# Patient Record
Sex: Female | Born: 1940 | Race: White | Hispanic: No | Marital: Married | Smoking: Never smoker
Health system: Southern US, Community
[De-identification: ages and names within clinical notes are randomized; demographics above are authoritative.]

## PROBLEM LIST (undated history)

## (undated) DIAGNOSIS — T8859XA Other complications of anesthesia, initial encounter: Secondary | ICD-10-CM

## (undated) DIAGNOSIS — D649 Anemia, unspecified: Secondary | ICD-10-CM

## (undated) DIAGNOSIS — Z8701 Personal history of pneumonia (recurrent): Secondary | ICD-10-CM

## (undated) DIAGNOSIS — E119 Type 2 diabetes mellitus without complications: Secondary | ICD-10-CM

## (undated) DIAGNOSIS — T4145XA Adverse effect of unspecified anesthetic, initial encounter: Secondary | ICD-10-CM

## (undated) DIAGNOSIS — H9190 Unspecified hearing loss, unspecified ear: Secondary | ICD-10-CM

## (undated) DIAGNOSIS — I6529 Occlusion and stenosis of unspecified carotid artery: Secondary | ICD-10-CM

## (undated) DIAGNOSIS — R531 Weakness: Secondary | ICD-10-CM

## (undated) DIAGNOSIS — I2510 Atherosclerotic heart disease of native coronary artery without angina pectoris: Secondary | ICD-10-CM

## (undated) DIAGNOSIS — K5790 Diverticulosis of intestine, part unspecified, without perforation or abscess without bleeding: Secondary | ICD-10-CM

## (undated) DIAGNOSIS — I499 Cardiac arrhythmia, unspecified: Secondary | ICD-10-CM

## (undated) DIAGNOSIS — A0472 Enterocolitis due to Clostridium difficile, not specified as recurrent: Secondary | ICD-10-CM

## (undated) DIAGNOSIS — C439 Malignant melanoma of skin, unspecified: Secondary | ICD-10-CM

## (undated) DIAGNOSIS — M1990 Unspecified osteoarthritis, unspecified site: Secondary | ICD-10-CM

## (undated) DIAGNOSIS — K219 Gastro-esophageal reflux disease without esophagitis: Secondary | ICD-10-CM

## (undated) DIAGNOSIS — Z8719 Personal history of other diseases of the digestive system: Secondary | ICD-10-CM

## (undated) DIAGNOSIS — I10 Essential (primary) hypertension: Secondary | ICD-10-CM

## (undated) DIAGNOSIS — G709 Myoneural disorder, unspecified: Secondary | ICD-10-CM

## (undated) DIAGNOSIS — F329 Major depressive disorder, single episode, unspecified: Secondary | ICD-10-CM

## (undated) DIAGNOSIS — I219 Acute myocardial infarction, unspecified: Secondary | ICD-10-CM

## (undated) DIAGNOSIS — R0602 Shortness of breath: Secondary | ICD-10-CM

## (undated) DIAGNOSIS — N189 Chronic kidney disease, unspecified: Secondary | ICD-10-CM

## (undated) DIAGNOSIS — Z87442 Personal history of urinary calculi: Secondary | ICD-10-CM

## (undated) DIAGNOSIS — IMO0001 Reserved for inherently not codable concepts without codable children: Secondary | ICD-10-CM

## (undated) DIAGNOSIS — J189 Pneumonia, unspecified organism: Secondary | ICD-10-CM

## (undated) DIAGNOSIS — I639 Cerebral infarction, unspecified: Secondary | ICD-10-CM

## (undated) DIAGNOSIS — I4891 Unspecified atrial fibrillation: Secondary | ICD-10-CM

## (undated) DIAGNOSIS — Z5189 Encounter for other specified aftercare: Secondary | ICD-10-CM

## (undated) DIAGNOSIS — F419 Anxiety disorder, unspecified: Secondary | ICD-10-CM

## (undated) DIAGNOSIS — E876 Hypokalemia: Secondary | ICD-10-CM

## (undated) DIAGNOSIS — C4400 Unspecified malignant neoplasm of skin of lip: Secondary | ICD-10-CM

## (undated) DIAGNOSIS — F32A Depression, unspecified: Secondary | ICD-10-CM

## (undated) HISTORY — DX: Unspecified atrial fibrillation: I48.91

## (undated) HISTORY — PX: EYE SURGERY: SHX253

## (undated) HISTORY — PX: KNEE SURGERY: SHX244

## (undated) HISTORY — PX: TONSILLECTOMY: SUR1361

## (undated) HISTORY — DX: Occlusion and stenosis of unspecified carotid artery: I65.29

## (undated) HISTORY — DX: Chronic kidney disease, unspecified: N18.9

## (undated) HISTORY — DX: Atherosclerotic heart disease of native coronary artery without angina pectoris: I25.10

## (undated) HISTORY — PX: NECK SURGERY: SHX720

## (undated) HISTORY — DX: Enterocolitis due to Clostridium difficile, not specified as recurrent: A04.72

## (undated) HISTORY — DX: Anxiety disorder, unspecified: F41.9

## (undated) HISTORY — DX: Type 2 diabetes mellitus without complications: E11.9

## (undated) HISTORY — PX: BACK SURGERY: SHX140

## (undated) HISTORY — PX: OTHER SURGICAL HISTORY: SHX169

## (undated) HISTORY — PX: CARDIAC CATHETERIZATION: SHX172

## (undated) HISTORY — DX: Unspecified malignant neoplasm of skin of lip: C44.00

## (undated) HISTORY — DX: Malignant melanoma of skin, unspecified: C43.9

## (undated) HISTORY — DX: Diverticulosis of intestine, part unspecified, without perforation or abscess without bleeding: K57.90

---

## 1998-09-29 ENCOUNTER — Other Ambulatory Visit: Admission: RE | Admit: 1998-09-29 | Discharge: 1998-09-29 | Payer: Self-pay | Admitting: Family Medicine

## 1999-07-05 ENCOUNTER — Encounter: Admission: RE | Admit: 1999-07-05 | Discharge: 1999-07-05 | Payer: Self-pay | Admitting: Internal Medicine

## 1999-07-05 ENCOUNTER — Encounter: Payer: Self-pay | Admitting: Internal Medicine

## 1999-07-23 ENCOUNTER — Other Ambulatory Visit: Admission: RE | Admit: 1999-07-23 | Discharge: 1999-07-23 | Payer: Self-pay | Admitting: Internal Medicine

## 1999-10-23 ENCOUNTER — Encounter (INDEPENDENT_AMBULATORY_CARE_PROVIDER_SITE_OTHER): Payer: Self-pay | Admitting: *Deleted

## 1999-10-23 ENCOUNTER — Other Ambulatory Visit: Admission: RE | Admit: 1999-10-23 | Discharge: 1999-10-23 | Payer: Self-pay | Admitting: Gastroenterology

## 2000-06-18 ENCOUNTER — Ambulatory Visit (HOSPITAL_COMMUNITY): Admission: RE | Admit: 2000-06-18 | Discharge: 2000-06-18 | Payer: Self-pay | Admitting: Internal Medicine

## 2000-08-05 ENCOUNTER — Encounter: Payer: Self-pay | Admitting: Internal Medicine

## 2000-08-05 ENCOUNTER — Encounter: Admission: RE | Admit: 2000-08-05 | Discharge: 2000-08-05 | Payer: Self-pay | Admitting: Internal Medicine

## 2001-10-05 ENCOUNTER — Encounter: Admission: RE | Admit: 2001-10-05 | Discharge: 2001-10-05 | Payer: Self-pay | Admitting: Internal Medicine

## 2001-10-05 ENCOUNTER — Encounter: Payer: Self-pay | Admitting: Internal Medicine

## 2002-10-18 ENCOUNTER — Encounter: Payer: Self-pay | Admitting: Internal Medicine

## 2002-10-18 ENCOUNTER — Encounter: Admission: RE | Admit: 2002-10-18 | Discharge: 2002-10-18 | Payer: Self-pay | Admitting: Internal Medicine

## 2003-01-04 ENCOUNTER — Ambulatory Visit (HOSPITAL_COMMUNITY): Admission: RE | Admit: 2003-01-04 | Discharge: 2003-01-04 | Payer: Self-pay | Admitting: Internal Medicine

## 2004-01-30 ENCOUNTER — Encounter: Admission: RE | Admit: 2004-01-30 | Discharge: 2004-01-30 | Payer: Self-pay | Admitting: Internal Medicine

## 2005-03-07 ENCOUNTER — Encounter: Admission: RE | Admit: 2005-03-07 | Discharge: 2005-03-07 | Payer: Self-pay | Admitting: Family Medicine

## 2005-05-20 ENCOUNTER — Other Ambulatory Visit: Admission: RE | Admit: 2005-05-20 | Discharge: 2005-05-20 | Payer: Self-pay | Admitting: Internal Medicine

## 2006-04-02 ENCOUNTER — Encounter: Admission: RE | Admit: 2006-04-02 | Discharge: 2006-04-02 | Payer: Self-pay | Admitting: Internal Medicine

## 2006-05-26 ENCOUNTER — Other Ambulatory Visit: Admission: RE | Admit: 2006-05-26 | Discharge: 2006-05-26 | Payer: Self-pay | Admitting: Internal Medicine

## 2007-05-25 ENCOUNTER — Encounter: Admission: RE | Admit: 2007-05-25 | Discharge: 2007-05-25 | Payer: Self-pay | Admitting: Internal Medicine

## 2007-07-16 HISTORY — PX: CAROTID ENDARTERECTOMY: SUR193

## 2007-09-29 ENCOUNTER — Ambulatory Visit: Payer: Self-pay | Admitting: Vascular Surgery

## 2008-04-05 ENCOUNTER — Ambulatory Visit: Payer: Self-pay | Admitting: Vascular Surgery

## 2008-04-12 ENCOUNTER — Ambulatory Visit: Payer: Self-pay | Admitting: Vascular Surgery

## 2008-04-27 ENCOUNTER — Ambulatory Visit: Payer: Self-pay | Admitting: Vascular Surgery

## 2008-04-27 ENCOUNTER — Encounter: Payer: Self-pay | Admitting: Vascular Surgery

## 2008-04-27 ENCOUNTER — Inpatient Hospital Stay (HOSPITAL_COMMUNITY): Admission: RE | Admit: 2008-04-27 | Discharge: 2008-04-28 | Payer: Self-pay | Admitting: Vascular Surgery

## 2008-05-17 ENCOUNTER — Ambulatory Visit: Payer: Self-pay | Admitting: Vascular Surgery

## 2008-05-30 ENCOUNTER — Encounter: Admission: RE | Admit: 2008-05-30 | Discharge: 2008-05-30 | Payer: Self-pay | Admitting: Internal Medicine

## 2008-11-29 ENCOUNTER — Ambulatory Visit: Payer: Self-pay | Admitting: Vascular Surgery

## 2009-06-05 ENCOUNTER — Ambulatory Visit: Payer: Self-pay | Admitting: Vascular Surgery

## 2009-11-06 ENCOUNTER — Other Ambulatory Visit: Admission: RE | Admit: 2009-11-06 | Discharge: 2009-11-06 | Payer: Self-pay | Admitting: Internal Medicine

## 2009-12-12 ENCOUNTER — Ambulatory Visit: Payer: Self-pay | Admitting: Vascular Surgery

## 2010-06-12 ENCOUNTER — Ambulatory Visit: Payer: Self-pay | Admitting: Vascular Surgery

## 2010-10-16 ENCOUNTER — Ambulatory Visit: Payer: Medicare Other | Attending: Orthopaedic Surgery | Admitting: Physical Therapy

## 2010-10-16 DIAGNOSIS — R5381 Other malaise: Secondary | ICD-10-CM | POA: Insufficient documentation

## 2010-10-16 DIAGNOSIS — M542 Cervicalgia: Secondary | ICD-10-CM | POA: Insufficient documentation

## 2010-10-16 DIAGNOSIS — IMO0001 Reserved for inherently not codable concepts without codable children: Secondary | ICD-10-CM | POA: Insufficient documentation

## 2010-10-16 DIAGNOSIS — R293 Abnormal posture: Secondary | ICD-10-CM | POA: Insufficient documentation

## 2010-10-18 ENCOUNTER — Ambulatory Visit: Payer: Medicare Other | Admitting: Physical Therapy

## 2010-10-24 ENCOUNTER — Ambulatory Visit: Payer: Medicare Other | Admitting: Physical Therapy

## 2010-10-25 ENCOUNTER — Ambulatory Visit: Payer: Medicare Other | Admitting: *Deleted

## 2010-10-29 ENCOUNTER — Ambulatory Visit: Payer: Medicare Other | Admitting: Physical Therapy

## 2010-10-31 ENCOUNTER — Ambulatory Visit: Payer: Medicare Other | Admitting: Physical Therapy

## 2010-11-06 ENCOUNTER — Ambulatory Visit: Payer: Medicare Other | Admitting: Physical Therapy

## 2010-11-07 ENCOUNTER — Ambulatory Visit: Payer: Medicare Other | Admitting: Physical Therapy

## 2010-11-12 ENCOUNTER — Ambulatory Visit: Payer: Medicare Other | Admitting: Physical Therapy

## 2010-11-14 ENCOUNTER — Ambulatory Visit: Payer: Medicare Other | Attending: Orthopaedic Surgery | Admitting: Physical Therapy

## 2010-11-14 DIAGNOSIS — M542 Cervicalgia: Secondary | ICD-10-CM | POA: Insufficient documentation

## 2010-11-14 DIAGNOSIS — R5381 Other malaise: Secondary | ICD-10-CM | POA: Insufficient documentation

## 2010-11-14 DIAGNOSIS — R293 Abnormal posture: Secondary | ICD-10-CM | POA: Insufficient documentation

## 2010-11-14 DIAGNOSIS — IMO0001 Reserved for inherently not codable concepts without codable children: Secondary | ICD-10-CM | POA: Insufficient documentation

## 2010-11-26 ENCOUNTER — Ambulatory Visit: Payer: Medicare Other | Admitting: Physical Therapy

## 2010-11-27 NOTE — Procedures (Signed)
CAROTID DUPLEX EXAM   INDICATION:  Follow up evaluation known carotid artery disease.   HISTORY:  Diabetes:  Orally controlled.  Cardiac:  No.  Hypertension:  Yes.  Smoking:  No.  Previous Surgery:  No.  CV History:  Previous Duplex on August 21, 2005, revealed a 60-79%  right ICA stenosis and a 60-79% left ICA stenosis.  Patient had a stroke  over 20 years ago.  Amaurosis Fugax No, Paresthesias No, Hemiparesis No                                       RIGHT             LEFT  Brachial systolic pressure:         136               138  Brachial Doppler waveforms:         Triphasic         Triphasic  Vertebral direction of flow:        Antegrade         Antegrade  DUPLEX VELOCITIES (cm/sec)  CCA peak systolic                   80                82  ECA peak systolic                   200               [id number]  ICA peak systolic                   284               [id number]  ICA end diastolic                   68                63  PLAQUE MORPHOLOGY:                  Mixed irregular   Calcified  PLAQUE AMOUNT:                      Moderate          Moderate  PLAQUE LOCATION:                    Proximal ICA, ECA Proximal ICA, ECA   IMPRESSION:  1. Right ICA stenosis 60-79%.  2. Left ICA stenosis 60-79% (high end of range).  3. Bilateral ECA stenosis.     ___________________________________________  Judeth Cornfield. Scot Dock, M.D.   MC/MEDQ  D:  09/29/2007  T:  09/29/2007  Job:  [id number]

## 2010-11-27 NOTE — Procedures (Signed)
CAROTID DUPLEX EXAM   INDICATION:  Follow up carotid artery disease.   HISTORY:  Diabetes:  Yes.  Cardiac:  No.  Hypertension:  Yes.  Smoking:  No.  Previous Surgery:  Left carotid endarterectomy 04/27/2008.  CV History:  Asymptomatic.  Amaurosis Fugax No, Paresthesias No, Hemiparesis No.                                       RIGHT             LEFT  Brachial systolic pressure:         138               142  Brachial Doppler waveforms:         Normal            Normal  Vertebral direction of flow:        Antegrade         Antegrade  DUPLEX VELOCITIES (cm/sec)  CCA peak systolic                   76                99991111  ECA peak systolic                   316               [id number]  ICA peak systolic                   305               [id number]  ICA end diastolic                   101               40  PLAQUE MORPHOLOGY:                  Heterogenous      Heterogenous  PLAQUE AMOUNT:                      Moderate/severe   Mild  PLAQUE LOCATION:                    ICA, ECA          CCA, ECA   IMPRESSION:  1. Right internal carotid artery velocities suggest a 60% to 79%      stenosis.  2. Right internal carotid artery velocities have increased from      previous study; however, still remain in the 60% to 79% category      range of stenosis.  3. Right external carotid artery stenosis.  4. Left internal carotid artery shows no evidence of restenosis,      status post carotid endarterectomy.   ___________________________________________  Judeth Cornfield. Scot Dock, M.D.   EM/MEDQ  D:  06/12/2010  T:  06/12/2010  Job:  [id number]

## 2010-11-27 NOTE — Assessment & Plan Note (Signed)
OFFICE VISIT   OTELIA, CHLEBOWSKI  DOB:  [birth year]                                       05/17/2008  [id number]   I saw the patient in the office today for followup after her recent left  carotid endarterectomy.  This is a 70 year old woman who I have been  following with a bilateral moderate carotid disease.  The stenosis on  the left progressed to greater than 80% and left carotid endarterectomy  was recommended in order to lower her risk of future stroke.  She  underwent left carotid endarterectomy with Dacron patch angioplasty on  April 27, 2008.  She did well postoperatively and was discharged on  postoperative day #1.  She returns for her first outpatient visit.  Overall she has been doing quite well and has had no focal neurologic  symptoms.   PHYSICAL EXAMINATION:  On examination her incision is healing nicely.  Blood pressure is 168/70, heart rate is 78.  Lungs are clear bilaterally  to auscultation.  Neurologic exam is nonfocal.   She does have a moderate 60-79% right carotid stenosis which we will  continue to follow.  I will see her back in 6 months for a followup  duplex scan.  She knows to call sooner if she has problems.  In the  meantime she knows to continue taking her aspirin.   Judeth Cornfield. Scot Dock, M.D.  Electronically Signed   CSD/MEDQ  D:  05/17/2008  T:  05/18/2008  Job:  [id number]   cc:   Dwaine Deter, M.D.

## 2010-11-27 NOTE — H&P (Signed)
HISTORY AND PHYSICAL EXAMINATION   April 12, 2008   Re:  TAKILA, DIVITO               DOB:  1941-04-24   DATE OF PLANNED ADMISSION:  04/27/2008.   REASON FOR ADMISSION:  Asymptomatic greater than 80% left carotid  stenosis.   HISTORY:  This is a pleasant 70 year old woman whom I have been  following with bilateral moderate carotid disease.  On her most recent  followup visit on April 12, 2008, it was noted that the left carotid  stenosis had progressed to greater than 80%.  Of note, she is right-  handed.  She has a remote history of a facial stroke many years ago  but has had no recent neurologic symptoms.  Specifically, she denies any  history of stroke, TIAs, expressive or receptive aphasia, or amaurosis  fugax.   PAST MEDICAL HISTORY:  Significant for adult onset diabetes.  She does  not require insulin.  In addition she has hypertension.  She denies any  history of hypercholesterolemia, history of previous myocardial  infarction, history of congestive heart failure or history of COPD.   FAMILY HISTORY:  Her mother had an MI but she is not sure at what age  this occurred.  She is unaware of any other history of premature  cardiovascular disease.   SOCIAL HISTORY:  She is married.  She has 2 children.  She does not  smoke tobacco.   ALLERGIES:  Mycin.   MEDICATIONS:  1. Aspirin 325 mg p.o. daily.  2. B complex 1 p.o. daily.  3. Janumet 50/1000 one p.o. daily.  4. Metformin 1000 mg 2 daily.  5. Allopurinol 100 mg pO2 daily.  6. Lasix 20 mg p.r.n.  7. Metoprolol 50 mg p.o. daily.  8. Citalopram 20 mg p.o. daily.  9. Diovan hydrochlorothiazide 100/12.5 one p.o. daily.   REVIEW OF SYSTEMS:  GENERAL:  She had no recent weight loss, weight  gain, problems with her appetite.  She is 190 pounds.  CARDIAC:  She had no chest pain, chest pressure.  She has occasional  palpitations.  She has no history of arrhythmias that she is aware of.  She has  had no significant dyspnea on exertion.  PULMONARY:  She has had no productive cough, bronchitis, asthma or  wheezing.  GI: She has had no recent change in her bowel habits except for some  occasional diarrhea.  She has a hiatal hernia.  GU: She has had no dysuria or frequency.  VASCULAR:  She has had no claudication rest pain or nonhealing ulcers.  She has had no DVT or phlebitis.  NEURO:  She has had no dizziness, blackouts, headaches or seizures.  ORTHO/SKIN:  She does have a history of arthritis and joint pain.  ENT:  She has had some recent loss of her hearing.  HEMATOLOGIC:  She has had no bleeding problems or clotting disorders.   PHYSICAL EXAMINATION:  This is a pleasant 70 year old woman who appears  her stated age.  Blood pressure is 150/78, heart rate is 78.  Neck is  supple.  There is no cervical lymphadenopathy.  She has soft bilateral  carotid bruits.  Lungs:  Clear bilaterally to auscultation.  Cardiac  exam, she has a regular rate and rhythm.  Abdomen:  Soft and nontender.  I cannot palpate an aneurysm.  She has palpable femoral pulses.  Both  feet are warm and well-perfused without ischemic ulcers.  She has mild  bilateral lower extremity swelling.  Neurologic:  She has good strength  in her upper extremities and lower extremities bilaterally.   I reviewed her duplex scan in the office today.  She has a greater than  80% left carotid stenosis with a 60-79% right carotid stenosis.  Both  vertebral arteries are patent with normally-directed flow.   I have recommended left carotid endarterectomy in order to lower her  risk of future stroke.  We have discussed the indications for the  procedure and the potential complications including but not limited to  bleeding, stroke (periprocedural risk 1-2%), nerve injury, MI, or other  unpredictable medical problems.  All of her questions were answered and  she is agreeable to proceed.  She would like to wait to schedule this   until October 14.  She knows to continue her aspirin right up through  surgery.   Judeth Cornfield. Scot Dock, M.D.  Electronically Signed   CSD/MEDQ  D:  04/12/2008  T:  04/13/2008  Job:  [id number]   cc:   Dwaine Deter, M.D.

## 2010-11-27 NOTE — Procedures (Signed)
CAROTID DUPLEX EXAM   INDICATION:  Followup carotid artery disease.   HISTORY:  Diabetes:  Yes  Cardiac:  No  Hypertension:  Yes  Smoking:  No  Previous Surgery:  Left carotid endarterectomy April 27, 2008  CV History:  No  Amaurosis Fugax No, Paresthesias No, Hemiparesis No                                       RIGHT             LEFT  Brachial systolic pressure:         140               138  Brachial Doppler waveforms:         WNL               WNL  Vertebral direction of flow:        Antegrade         Antegrade  DUPLEX VELOCITIES (cm/sec)  CCA peak systolic                   98                91  ECA peak systolic                   325               99991111  ICA peak systolic                   232               99991111  ICA end diastolic                   62                33  PLAQUE MORPHOLOGY:                  Heterogeneous     Heterogeneous  PLAQUE AMOUNT:                      Moderate  PLAQUE LOCATION:                    ICA/ECA   IMPRESSION:  1. Right internal carotid artery suggests a 60% to 79% stenosis.  2. Left internal carotid artery appears patent status post carotid      endarterectomy with no evidence of restenosis.  3. Right external carotid artery stenosis.  4. Antegrade flow noted in bilateral vertebrals.       ___________________________________________  Judeth Cornfield. Scot Dock, M.D.   CB/MEDQ  D:  12/12/2009  T:  12/12/2009  Job:  [id number]

## 2010-11-27 NOTE — Procedures (Signed)
CAROTID DUPLEX EXAM   INDICATION:  Left carotid endarterectomy.   HISTORY:  Diabetes:  Yes.  Cardiac:  No.  Hypertension:  Yes.  Smoking:  No.  Previous Surgery:  Left carotid endarterectomy on 04/27/08.  CV History:  Asymptomatic.  Amaurosis Fugax No, Paresthesias No hemiparesis No.                                       RIGHT             LEFT  Brachial systolic pressure:         182               184  Brachial Doppler waveforms:         Normal            Normal  Vertebral direction of flow:        Antegrade         Antegrade  DUPLEX VELOCITIES (cm/sec)  CCA peak systolic                   87                [id number]  ECA peak systolic                   223               [id number]  ICA peak systolic                   228               73  ICA end diastolic                   52                21  PLAQUE MORPHOLOGY:                  Mixed  PLAQUE AMOUNT:                      Moderate/Severe   None  PLAQUE LOCATION:                    ICA/ECA/CCA   IMPRESSION:  1. Low-end 60-79% stenosis of the right internal carotid artery.  2. Patent left carotid endarterectomy site with no evidence of      stenosis in the left internal carotid artery.  3. Improvement of the left internal carotid artery noted when compared      to the previous examination prior to surgery on 04/05/08.  The      right internal carotid artery appears stable.   ___________________________________________  Judeth Cornfield. Scot Dock, M.D.   CH/MEDQ  D:  11/30/2008  T:  11/30/2008  Job:  [id number]

## 2010-11-27 NOTE — Assessment & Plan Note (Signed)
OFFICE VISIT   [name], [name]  DOB:  Jun 01, 1941                                       11/29/2008  [id number]   I saw the patient in the office today for continued followup of her  carotid disease.  This is a pleasant 70 year old woman who underwent  left carotid endarterectomy with Dacron patch angioplasty on 04/27/2008  for a greater than 80% left carotid stenosis.  She returns for a 6 month  followup visit.  She has a known moderate right carotid stenosis.  Since  I saw her last she denies any history of stroke, TIAs, expressive or  receptive aphasia or amaurosis fugax.   REVIEW OF SYSTEMS:  She has had no recent chest pain, chest pressure,  palpitations or arrhythmias.  She has had no productive cough,  bronchitis, asthma or wheezing.   PHYSICAL EXAMINATION:  This is a pleasant 70 year old woman who appears  her stated age.  Blood pressure is 116/72, heart rate is 76.  Neck is  supple.  She has bilateral carotid bruits.  Lungs are clear bilaterally  to auscultation.  On cardiac exam she has a regular rate and rhythm.  Neurologic exam is nonfocal.   Carotid duplex scan shows a 60-79% right carotid stenosis in the lower  end of this range.  There is no evidence of recurrent stenosis on the  left.   She understands we would not consider right carotid endarterectomy  unless the stenosis progressed to greater than 80% or if she developed  new neurologic symptoms.  I will see her back in 6 months for a followup  duplex scan.  She knows to call sooner if she has problems.  In the  meantime she knows to continue taking her aspirin.   Judeth Cornfield. Scot Dock, M.D.  Electronically Signed   CSD/MEDQ  D:  11/29/2008  T:  11/30/2008  Job:  [id number]   cc:   Dwaine Deter, M.D.

## 2010-11-27 NOTE — Procedures (Signed)
CAROTID DUPLEX EXAM   INDICATION:  Carotid disease.   HISTORY:  Diabetes:  Yes  Cardiac:  No  Hypertension:  Yes  Smoking:  No  Previous Surgery:  Left carotid endarterectomy on 04/27/2008.  CV History:  Currently asymptomatic.  Amaurosis Fugax No, Paresthesias No, Hemiparesis No                                       RIGHT             LEFT  Brachial systolic pressure:         174               176  Brachial Doppler waveforms:         normal            normal  Vertebral direction of flow:        antegrade         antegrade  DUPLEX VELOCITIES (cm/sec)  CCA peak systolic                   101               78  ECA peak systolic                   238               [id number]  ICA peak systolic                   227               [id number]  ICA end diastolic                   59                29  PLAQUE MORPHOLOGY:                  mixed  PLAQUE AMOUNT:                      moderate/severe   none  PLAQUE LOCATION:                    ICA/ECA/CCA   IMPRESSION:  1. Low-end 60- 79% stenosis of the right internal carotid artery.  2. Patent left carotid endarterectomy site with no left internal      carotid artery stenosis.  3. No significant change noted when compared to the previous exam on      11/29/2008.   ___________________________________________  Judeth Cornfield. Scot Dock, M.D.   CH/MEDQ  D:  06/05/2009  T:  06/06/2009  Job:  [id number]

## 2010-11-27 NOTE — Discharge Summary (Signed)
[name], [name]               ACCOUNT NO.:  [id number]   MEDICAL RECORD NO.:  [id number]          PATIENT TYPE:  INP   LOCATION:  25                         FACILITY:  [facility]   PHYSICIAN:  Judeth Cornfield. Scot Dock, M.D.DATE OF BIRTH:  1941/03/02   DATE OF ADMISSION:  04/27/2008  DATE OF DISCHARGE:  04/28/2008                               DISCHARGE SUMMARY   REASON FOR ADMISSION:  Greater than 80% asymptomatic left carotid  stenosis.   HISTORY:  This is a 70 year old woman who I have been following with  bilateral moderate carotid disease.  On her most recent followup visit  on April 12, 2008, it was noted that the left carotid stenosis had  progressed to greater than 80%.  She was brought in for elective left  carotid endarterectomy in order to lower her risk of future stroke.  Her  past medical history was otherwise significant for adult onset diabetes  and hypertension.  The remainder of her history and physical is as  dictated without addition or deletion.   HOSPITAL COURSE:  The patient was admitted on April 29, 2008, and  underwent an uneventful left carotid endarterectomy with Dacron patch  angioplasty.  She awoke neurologically intact and was monitored  initially in the recovery room and then transferred to the step-down  unit.  She did require dopamine to help maintain her blood pressure.  She apparently had some problems with hypertension in the past.  She  remained neurologically intact.  By postoperative day #1, she was eating  and ambulating without difficulty.  We held her Diovan to wean her off  the dopamine with plans to discharge her once she was off the dopamine.   PROCEDURES THIS ADMISSION:  Left carotid endarterectomy with Dacron  patch angioplasty on April 27, 2008.   DISCHARGE DIAGNOSIS:  Greater than 80% asymptomatic left carotid  stenosis.   SECONDARY DIAGNOSES:  Diabetes and hypertension.   FOLLOWUP:  In 2 weeks.   DISCHARGE  MEDICATIONS:  1. Aspirin 325 mg p.o. daily.  2. B complex one p.o. daily.  3. Janumet 50/1000 one p.o. daily.  4. Metformin 1000 mg b.i.d.  5. Allopurinol 100 mg p.o. daily.  6. Metoprolol 50 mg one p.o. daily.  7. Citalopram 20 mg p.o. daily.  8. Diovan HCT 160/12.5 mg p.o. daily.  9. Travatan drops 1 drop both eyes at bedtime.   CONDITION ON DISCHARGE:  Good.   DISPOSITION:  To home.      Judeth Cornfield. Scot Dock, M.D.  Electronically Signed     CSD/MEDQ  D:  04/28/2008  T:  04/28/2008  Job:  [id number]   cc:   Dwaine Deter, M.D.

## 2010-11-27 NOTE — Op Note (Signed)
[name], [name]               ACCOUNT NO.:  [id number]   MEDICAL RECORD NO.:  [id number]          PATIENT TYPE:  INP   LOCATION:  47                         FACILITY:  [facility]   PHYSICIAN:  Judeth Cornfield. Scot Dock, M.D.DATE OF BIRTH:  1941-07-01   DATE OF PROCEDURE:  DATE OF DISCHARGE:                               OPERATIVE REPORT   PREOPERATIVE DIAGNOSIS:  Greater than 80% asymptomatic left carotid  stenosis.   POSTOPERATIVE DIAGNOSIS:  Greater than 80% asymptomatic left carotid  stenosis.   PROCEDURE:  Left carotid endarterectomy with Dacron patch angioplasty.   SURGEON:  Judeth Cornfield. Scot Dock, MD   ASSISTANT:  Debra Shoe, PA.   ANESTHESIA:  General.   INDICATIONS:  This is a 70 year old woman who I have been following with  a moderate carotid disease.  The left carotid stenosis progressed to  greater than 80% and left carotid endarterectomy was recommended in  order to lower her risk of stroke.  The procedure and potential  complications were discussed with the patient preoperatively.  All of  her questions were answered and she was agreeable to proceed.   TECHNIQUE:  The patient was taken to the operating room and received a  general anesthetic.  Arterial line had been placed by anesthesia.  The  neck and upper chest on the left were prepped and draped in the usual  sterile fashion.  An incision was made along the anterior border of the  sternocleidomastoid and dissection was carried down the common carotid  artery, which was dissected free and controlled with a Rumel tourniquet.  The facial vein was divided between 2-0 silk ties.  The internal carotid  artery above the plaque was controlled with a red vessel loop.  The  external carotid artery was controlled with a blue vessel loop.  The  superior thyroid artery was controlled.  The patient was then  heparinized.  Clamps were then placed on the internal, then the  external, and then common carotid artery.   A longitudinal arteriotomy  was made in the common carotid artery.  This was extended through the  plaque into the internal carotid artery.  The 10 shunt was placed into  the internal carotid artery, backbled and then placed in the common  carotid artery and secured with a Rumel tourniquet.  Flow was  reestablished to the shunt.  Next, the plaque was extended further  distally.  I dissected the common carotid further proximally and moved  the Rumel tourniquet down and extended the arteriotomy such that I could  get this thicker plaque in the common carotid artery.  Endarterectomy  plane was established proximally and the plaque was sharply divided.  Eversion endarterectomy was performed in the external carotid artery.  Distally, there was a nice taper in the plaque and one tacking suture  was placed.  The artery was irrigated with copious amounts of heparin  and dextran and all loose debris were removed.  The Dacron patch was  then sewn using continuous 6-0 Prolene suture.  Prior to completing the  patch closure, the artery was backbled and flushed appropriately and  anastomosis completed.  Flow was reestablished first to the external  carotid artery and to the internal carotid artery.  At completion, there  was a good pulse distal to the patch.  The shunt had been removed prior  to closing the graft of the Dacron patch angioplasty.  Hemostasis was  obtained of the wound.  The wound was then closed with deep layer of 3-0  Vicryl.  The platysma  was closed with running 3-0 Vicryl.  Skin was closed with 4-0  subcuticular stitch.  A sterile dressing was applied.  The patient awoke  neurologically intact and was transferred to the recovery room in stable  condition.  All needle and sponge counts were correct.      Judeth Cornfield. Scot Dock, M.D.  Electronically Signed     CSD/MEDQ  D:  04/27/2008  T:  04/27/2008  Job:  [id number]   cc:   Dwaine Deter, M.D.

## 2010-11-27 NOTE — Procedures (Signed)
CAROTID DUPLEX EXAM   INDICATION:  Follow-up evaluation of known carotid artery disease.   HISTORY:  Diabetes:  Orally controlled.  Cardiac:  No.  Hypertension:  Yes.  Smoking:  No.  Previous Surgery:  No.  CV History:  Previous duplex performed on 09/29/07 revealed 60-79% ICA  stenosis bilaterally.  Amaurosis Fugax No, Paresthesias No, Hemiparesis No.                                       RIGHT             LEFT  Brachial systolic pressure:         140               146  Brachial Doppler waveforms:         Triphasic         Triphasic  Vertebral direction of flow:        Antegrade         Antegrade  DUPLEX VELOCITIES (cm/sec)  CCA peak systolic                   77                64  ECA peak systolic                   181               [id number]  ICA peak systolic                   250               [id number]  ICA end diastolic                   85                126  PLAQUE MORPHOLOGY:                  Calcified         Calcified  PLAQUE AMOUNT:                      Moderate          Severe  PLAQUE LOCATION:                    Proximal ICA      Proximal ICA   IMPRESSION:  1. 60-79% right internal carotid artery stenosis.  2. 80-99% left internal carotid artery stenosis.  3. Left external carotid artery stenosis.   Dr. Scot Dock was notified of the preliminary results, and the patient was  scheduled for a follow-up doctor's appointment.   ___________________________________________  Judeth Cornfield. Scot Dock, M.D.   MC/MEDQ  D:  04/05/2008  T:  04/05/2008  Job:  [id number]

## 2010-11-28 ENCOUNTER — Ambulatory Visit: Payer: Medicare Other | Admitting: Physical Therapy

## 2010-12-03 ENCOUNTER — Ambulatory Visit: Payer: Medicare Other | Admitting: Physical Therapy

## 2010-12-05 ENCOUNTER — Encounter: Payer: Medicare Other | Admitting: Physical Therapy

## 2010-12-11 ENCOUNTER — Ambulatory Visit: Payer: Medicare Other | Admitting: Physical Therapy

## 2010-12-12 ENCOUNTER — Encounter: Payer: Medicare Other | Admitting: Physical Therapy

## 2010-12-13 ENCOUNTER — Other Ambulatory Visit (INDEPENDENT_AMBULATORY_CARE_PROVIDER_SITE_OTHER): Payer: Medicare Other

## 2010-12-13 ENCOUNTER — Ambulatory Visit (INDEPENDENT_AMBULATORY_CARE_PROVIDER_SITE_OTHER): Payer: Medicare Other

## 2010-12-13 DIAGNOSIS — Z48812 Encounter for surgical aftercare following surgery on the circulatory system: Secondary | ICD-10-CM

## 2010-12-13 DIAGNOSIS — I6529 Occlusion and stenosis of unspecified carotid artery: Secondary | ICD-10-CM

## 2010-12-13 NOTE — H&P (Signed)
HISTORY AND PHYSICAL EXAMINATION  Dec 13, 2010  Re:  [name], [name]               DOB:  10-24-1940  HISTORY OF PRESENT ILLNESS:  The patient is a 70 year old Caucasian female who presents today for continued follow-up of her known carotid disease.  The patient is status post left carotid endarterectomy in 2009 and has done well since that time.  She has been seen on a routine 6- month follow up visit for known right internal carotid artery stenosis. The patient continues to deny symptoms of TIA and CVA.  She also denies symptoms of claudication, rest pain, and nonhealing wounds in the lower extremities.  PAST MEDICAL HISTORY: 1. Diabetes mellitus. 2. Hypertension. 3. Arthritis. 4. Chronic shortness of breath.  PAST SURGICAL HISTORY: 1. Left carotid endarterectomy. 2. Bilateral cataract removal. 3. Carpal tunnel repair left hand. 4. Tonsillectomy.  ALLERGIES:  No known drug allergies.  MEDICATIONS:  The patient did not bring a list of medications with her but she does take aspirin 325 mg daily.  SOCIAL HISTORY:  The patient is married with two children.  Retired. She denies tobacco and alcohol use.  FAMILY HISTORY:  Her mother died of myocardial infarction and her father died of lung cancer.  REVIEW OF SYSTEMS:  CONSTITUTIONAL:  The patient denies weight loss, weight gain or loss appetite. CARDIOVASCULAR:  She denies claudication or rest pain, nonhealing ulcers and phlebitis in the lower extremities.  She also denies amaurosis, slurred speech, dysphasia, dysarthria and hemiparesis.  She denies chest pain and arrhythmias.  She does have shortness of breath with exertion. GASTROINTESTINAL:  The patient denies blood in stool or urine.  She has reflux and hiatal hernia.  The patient denies diarrhea and constipation. NEUROLOGIC:  The patient denies dizziness, blackouts, headaches and seizures as well as asthma and wheezing. HEMATOLOGIC:  She also denies  bleeding problems in clotted source. GENITOURINARY:  The patient denies kidney disease, burning with urination and difficulty urinating.  She does have arthritis and joint pain.  PHYSICAL EXAM:  Vital Signs:  Blood pressure 171/60 right arm sitting, rate 63, O2 sat 99% on room air.  General: This is a well-nourished female in no acute distress.  HEENT:  PERRL.  EOMI.  Normal.  Lungs: Were clear to auscultation.  Cardiac:  Is regular rate and rhythm. There is a right carotid bruit noted.  Abdomen: Soft, nontender with normal bowel sounds.  Musculoskeletal:  No major deformities or cyanosis noted.  There are 2+ pulses present in the bilateral radial, femoral and dorsalis pedis pulses.  There is 1+ edema present in the bilateral lower extremities.  The bilateral lower extremities are warm and well- perfused.  Neuro:  Exam nonfocal with no weakness or paresthesias. Skin: No ulcers or rashes.  IMAGING:  The patient had carotid duplex performed on 12/13/2010 which revealed a right internal carotid artery stenosis in the 60% to 79% range.  Right external carotid artery stenosis is present.  Left internal carotid artery is patent status post carotid endarterectomy. This study is essentially unchanged from the previous studies on 06/12/2010.  ASSESSMENT: 1. Diabetes mellitus. 2. Hypertension. 3. Arthritis. 4. 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  PLAN: 1. The patient's medical issues will continue to be followed by her     primary care physician. 2. The patient's carotid disease remains stable.  She will follow up     in 6 months with a repeat carotid duplex.  She  knows to call if she     has any changes in symptoms or new problems.  Leta Baptist, PA  Charles E. Fields, MD Electronically Signed  AY/MEDQ  D:  12/13/2010  T:  12/13/2010  Job:  [id number]

## 2010-12-17 ENCOUNTER — Ambulatory Visit: Payer: Medicare Other | Attending: Orthopaedic Surgery | Admitting: Physical Therapy

## 2010-12-17 DIAGNOSIS — R5381 Other malaise: Secondary | ICD-10-CM | POA: Insufficient documentation

## 2010-12-17 DIAGNOSIS — M542 Cervicalgia: Secondary | ICD-10-CM | POA: Insufficient documentation

## 2010-12-17 DIAGNOSIS — IMO0001 Reserved for inherently not codable concepts without codable children: Secondary | ICD-10-CM | POA: Insufficient documentation

## 2010-12-17 DIAGNOSIS — R293 Abnormal posture: Secondary | ICD-10-CM | POA: Insufficient documentation

## 2010-12-19 ENCOUNTER — Ambulatory Visit: Payer: Medicare Other | Admitting: Physical Therapy

## 2010-12-19 NOTE — Procedures (Unsigned)
CAROTID DUPLEX EXAM  INDICATION:  Follow up carotid stenosis.  HISTORY: Diabetes:  Yes. Cardiac:  No. Hypertension:  Yes. Smoking:  No. Previous Surgery:  Left carotid endarterectomy, 04/27/2008. CV History:  Asymptomatic. Amaurosis Fugax No, Paresthesias No, Hemiparesis No.                                      RIGHT             LEFT Brachial systolic pressure:         160               156 Brachial Doppler waveforms:         WNL               WNL Vertebral direction of flow:        Antegrade         Antegrade DUPLEX VELOCITIES (cm/sec) CCA peak systolic                   69                [id number] ECA peak systolic                   303               89 ICA peak systolic                   276               78 ICA end diastolic                   65                25 PLAQUE MORPHOLOGY:                  Heterogenous      Heterogenous PLAQUE AMOUNT:                      Moderate-to-severe                  Minimal PLAQUE LOCATION:                    CCA, ICA, ECA     CCA  IMPRESSION: 1.  Right internal carotid artery stenosis in the 60% to 79% range. 2.  Right external carotid artery stenosis present. 3.  Left internal carotid artery appears patent with history of     endarterectomy. 4.  Essentially unchanged since previous study on 06/12/2010.  ___________________________________________ Judeth Cornfield. Scot Dock, M.D.  SH/MEDQ  D:  12/13/2010  T:  12/13/2010  Job:  [id number]

## 2011-04-16 LAB — CBC
HCT: 30 — ABNORMAL LOW
Hemoglobin: 9.9 — ABNORMAL LOW
MCHC: 33.8
RBC: 3.06 — ABNORMAL LOW
RDW: 15.3
RDW: 15.7 — ABNORMAL HIGH
WBC: 8.6

## 2011-04-16 LAB — PROTIME-INR
INR: 1
Prothrombin Time: 13.2

## 2011-04-16 LAB — GLUCOSE, CAPILLARY
Glucose-Capillary: 181 — ABNORMAL HIGH
Glucose-Capillary: 198 — ABNORMAL HIGH

## 2011-04-16 LAB — URINALYSIS, ROUTINE W REFLEX MICROSCOPIC
Bilirubin Urine: NEGATIVE
Glucose, UA: NEGATIVE
Hgb urine dipstick: NEGATIVE
Specific Gravity, Urine: 1.024
pH: 5

## 2011-04-16 LAB — URINE MICROSCOPIC-ADD ON

## 2011-04-16 LAB — COMPREHENSIVE METABOLIC PANEL
ALT: 20
AST: 23
CO2: 23
Calcium: 8.5
Chloride: 108
Creatinine, Ser: 1.97 — ABNORMAL HIGH
GFR calc Af Amer: 31 — ABNORMAL LOW
Glucose, Bld: 111 — ABNORMAL HIGH
Sodium: 137

## 2011-04-16 LAB — BASIC METABOLIC PANEL
Calcium: 7.1 — ABNORMAL LOW
Sodium: 135

## 2011-04-16 LAB — TYPE AND SCREEN: Antibody Screen: NEGATIVE

## 2011-06-17 ENCOUNTER — Other Ambulatory Visit (INDEPENDENT_AMBULATORY_CARE_PROVIDER_SITE_OTHER): Payer: Medicare Other | Admitting: *Deleted

## 2011-06-17 DIAGNOSIS — Z48812 Encounter for surgical aftercare following surgery on the circulatory system: Secondary | ICD-10-CM

## 2011-06-17 DIAGNOSIS — I6529 Occlusion and stenosis of unspecified carotid artery: Secondary | ICD-10-CM

## 2011-07-01 ENCOUNTER — Other Ambulatory Visit: Payer: Self-pay

## 2011-07-01 DIAGNOSIS — Z48812 Encounter for surgical aftercare following surgery on the circulatory system: Secondary | ICD-10-CM

## 2011-07-01 DIAGNOSIS — I6529 Occlusion and stenosis of unspecified carotid artery: Secondary | ICD-10-CM

## 2011-07-03 ENCOUNTER — Encounter: Payer: Self-pay | Admitting: Surgery

## 2011-07-03 NOTE — Procedures (Unsigned)
CAROTID DUPLEX EXAM  INDICATION:  Left carotid endarterectomy.  HISTORY: Diabetes:  Yes. Cardiac:  No. Hypertension:  Yes. Smoking:  No. Previous Surgery:  Left carotid endarterectomy on 04/27/2008. CV History:  Currently asymptomatic. Amaurosis Fugax No, Paresthesias No, Hemiparesis No.                                      RIGHT             LEFT Brachial systolic pressure:         152               154 Brachial Doppler waveforms:         Normal            Normal Vertebral direction of flow:        Antegrade         Antegrade DUPLEX VELOCITIES (cm/sec) CCA peak systolic                   67                74 ECA peak systolic                   246               98 ICA peak systolic                   224               [id number] ICA end diastolic                   60                27 PLAQUE MORPHOLOGY:                  Mixed PLAQUE AMOUNT:                      Moderate            None PLAQUE LOCATION:                    ICA/ECA/CCA  IMPRESSION: 1. Doppler velocities suggest a low end 60% to 79% stenosis of the     right proximal internal carotid artery. 2. Patent left carotid endarterectomy site with no left internal     carotid artery stenosis. 3. No significant change noted when compared to the previous     examination on 12/13/2010  ___________________________________________ V. Leia Alf, MD  CH/MEDQ  D:  06/18/2011  T:  06/18/2011  Job:  [id number]

## 2011-07-19 DIAGNOSIS — I209 Angina pectoris, unspecified: Secondary | ICD-10-CM | POA: Diagnosis not present

## 2011-07-23 ENCOUNTER — Other Ambulatory Visit: Payer: Self-pay | Admitting: Interventional Cardiology

## 2011-07-24 ENCOUNTER — Encounter (HOSPITAL_COMMUNITY): Payer: Medicare Other

## 2011-07-24 ENCOUNTER — Encounter (HOSPITAL_BASED_OUTPATIENT_CLINIC_OR_DEPARTMENT_OTHER)
Admission: RE | Disposition: A | Discharge status: Hospice | Payer: Self-pay | Source: Ambulatory Visit | Attending: Interventional Cardiology

## 2011-07-24 ENCOUNTER — Inpatient Hospital Stay (HOSPITAL_BASED_OUTPATIENT_CLINIC_OR_DEPARTMENT_OTHER)
Admission: RE | Admit: 2011-07-24 | Discharge: 2011-07-24 | Disposition: A | Discharge status: Hospice | Payer: Medicare Other | Source: Ambulatory Visit | Attending: Interventional Cardiology | Admitting: Interventional Cardiology

## 2011-07-24 ENCOUNTER — Other Ambulatory Visit: Payer: Self-pay

## 2011-07-24 ENCOUNTER — Encounter (HOSPITAL_COMMUNITY): Payer: Self-pay | Admitting: General Practice

## 2011-07-24 ENCOUNTER — Encounter (HOSPITAL_COMMUNITY): Payer: Self-pay | Admitting: Pharmacy Technician

## 2011-07-24 ENCOUNTER — Inpatient Hospital Stay (HOSPITAL_COMMUNITY)
Admission: AD | Admit: 2011-07-24 | Discharge: 2011-08-03 | DRG: 234 | Disposition: A | Discharge status: Home / Self Care | Payer: Medicare Other | Source: Ambulatory Visit | Attending: Surgery | Admitting: Surgery

## 2011-07-24 DIAGNOSIS — E785 Hyperlipidemia, unspecified: Secondary | ICD-10-CM | POA: Diagnosis present

## 2011-07-24 DIAGNOSIS — G609 Hereditary and idiopathic neuropathy, unspecified: Secondary | ICD-10-CM | POA: Diagnosis present

## 2011-07-24 DIAGNOSIS — N189 Chronic kidney disease, unspecified: Secondary | ICD-10-CM | POA: Diagnosis present

## 2011-07-24 DIAGNOSIS — E669 Obesity, unspecified: Secondary | ICD-10-CM | POA: Diagnosis present

## 2011-07-24 DIAGNOSIS — R9430 Abnormal result of cardiovascular function study, unspecified: Secondary | ICD-10-CM | POA: Diagnosis not present

## 2011-07-24 DIAGNOSIS — Z09 Encounter for follow-up examination after completed treatment for conditions other than malignant neoplasm: Secondary | ICD-10-CM | POA: Diagnosis not present

## 2011-07-24 DIAGNOSIS — Z860100 Personal history of colon polyps, unspecified: Secondary | ICD-10-CM

## 2011-07-24 DIAGNOSIS — I2510 Atherosclerotic heart disease of native coronary artery without angina pectoris: Principal | ICD-10-CM | POA: Diagnosis present

## 2011-07-24 DIAGNOSIS — K219 Gastro-esophageal reflux disease without esophagitis: Secondary | ICD-10-CM | POA: Diagnosis not present

## 2011-07-24 DIAGNOSIS — D649 Anemia, unspecified: Secondary | ICD-10-CM | POA: Diagnosis present

## 2011-07-24 DIAGNOSIS — K2990 Gastroduodenitis, unspecified, without bleeding: Secondary | ICD-10-CM | POA: Diagnosis present

## 2011-07-24 DIAGNOSIS — R0602 Shortness of breath: Secondary | ICD-10-CM | POA: Diagnosis not present

## 2011-07-24 DIAGNOSIS — Z833 Family history of diabetes mellitus: Secondary | ICD-10-CM

## 2011-07-24 DIAGNOSIS — Z951 Presence of aortocoronary bypass graft: Secondary | ICD-10-CM | POA: Diagnosis not present

## 2011-07-24 DIAGNOSIS — M47812 Spondylosis without myelopathy or radiculopathy, cervical region: Secondary | ICD-10-CM | POA: Diagnosis present

## 2011-07-24 DIAGNOSIS — Z8711 Personal history of peptic ulcer disease: Secondary | ICD-10-CM | POA: Diagnosis not present

## 2011-07-24 DIAGNOSIS — Z8673 Personal history of transient ischemic attack (TIA), and cerebral infarction without residual deficits: Secondary | ICD-10-CM | POA: Diagnosis not present

## 2011-07-24 DIAGNOSIS — Z79899 Other long term (current) drug therapy: Secondary | ICD-10-CM | POA: Diagnosis not present

## 2011-07-24 DIAGNOSIS — Z01810 Encounter for preprocedural cardiovascular examination: Secondary | ICD-10-CM | POA: Diagnosis not present

## 2011-07-24 DIAGNOSIS — K648 Other hemorrhoids: Secondary | ICD-10-CM | POA: Diagnosis present

## 2011-07-24 DIAGNOSIS — Z8249 Family history of ischemic heart disease and other diseases of the circulatory system: Secondary | ICD-10-CM | POA: Diagnosis not present

## 2011-07-24 DIAGNOSIS — I200 Unstable angina: Secondary | ICD-10-CM | POA: Diagnosis not present

## 2011-07-24 DIAGNOSIS — M47817 Spondylosis without myelopathy or radiculopathy, lumbosacral region: Secondary | ICD-10-CM | POA: Diagnosis present

## 2011-07-24 DIAGNOSIS — I10 Essential (primary) hypertension: Secondary | ICD-10-CM | POA: Insufficient documentation

## 2011-07-24 DIAGNOSIS — E119 Type 2 diabetes mellitus without complications: Secondary | ICD-10-CM | POA: Diagnosis not present

## 2011-07-24 DIAGNOSIS — F411 Generalized anxiety disorder: Secondary | ICD-10-CM | POA: Diagnosis present

## 2011-07-24 DIAGNOSIS — I129 Hypertensive chronic kidney disease with stage 1 through stage 4 chronic kidney disease, or unspecified chronic kidney disease: Secondary | ICD-10-CM | POA: Diagnosis present

## 2011-07-24 DIAGNOSIS — Z7982 Long term (current) use of aspirin: Secondary | ICD-10-CM | POA: Diagnosis not present

## 2011-07-24 DIAGNOSIS — I739 Peripheral vascular disease, unspecified: Secondary | ICD-10-CM | POA: Insufficient documentation

## 2011-07-24 DIAGNOSIS — Z01811 Encounter for preprocedural respiratory examination: Secondary | ICD-10-CM | POA: Diagnosis not present

## 2011-07-24 DIAGNOSIS — G9332 Myalgic encephalomyelitis/chronic fatigue syndrome: Secondary | ICD-10-CM | POA: Diagnosis present

## 2011-07-24 DIAGNOSIS — I209 Angina pectoris, unspecified: Secondary | ICD-10-CM | POA: Diagnosis not present

## 2011-07-24 DIAGNOSIS — M109 Gout, unspecified: Secondary | ICD-10-CM | POA: Diagnosis present

## 2011-07-24 DIAGNOSIS — Z794 Long term (current) use of insulin: Secondary | ICD-10-CM

## 2011-07-24 DIAGNOSIS — R5382 Chronic fatigue, unspecified: Secondary | ICD-10-CM | POA: Diagnosis present

## 2011-07-24 DIAGNOSIS — R918 Other nonspecific abnormal finding of lung field: Secondary | ICD-10-CM | POA: Diagnosis not present

## 2011-07-24 DIAGNOSIS — J9819 Other pulmonary collapse: Secondary | ICD-10-CM | POA: Diagnosis not present

## 2011-07-24 DIAGNOSIS — Z801 Family history of malignant neoplasm of trachea, bronchus and lung: Secondary | ICD-10-CM

## 2011-07-24 DIAGNOSIS — K2900 Acute gastritis without bleeding: Secondary | ICD-10-CM | POA: Diagnosis not present

## 2011-07-24 DIAGNOSIS — D500 Iron deficiency anemia secondary to blood loss (chronic): Secondary | ICD-10-CM | POA: Diagnosis not present

## 2011-07-24 DIAGNOSIS — K2970 Gastritis, unspecified, without bleeding: Secondary | ICD-10-CM | POA: Diagnosis present

## 2011-07-24 DIAGNOSIS — I517 Cardiomegaly: Secondary | ICD-10-CM | POA: Diagnosis not present

## 2011-07-24 DIAGNOSIS — Z9889 Other specified postprocedural states: Secondary | ICD-10-CM | POA: Diagnosis not present

## 2011-07-24 HISTORY — DX: Anemia, unspecified: D64.9

## 2011-07-24 HISTORY — DX: Unspecified osteoarthritis, unspecified site: M19.90

## 2011-07-24 HISTORY — DX: Reserved for inherently not codable concepts without codable children: IMO0001

## 2011-07-24 HISTORY — DX: Gastro-esophageal reflux disease without esophagitis: K21.9

## 2011-07-24 HISTORY — DX: Myoneural disorder, unspecified: G70.9

## 2011-07-24 HISTORY — DX: Essential (primary) hypertension: I10

## 2011-07-24 HISTORY — DX: Shortness of breath: R06.02

## 2011-07-24 HISTORY — DX: Cerebral infarction, unspecified: I63.9

## 2011-07-24 HISTORY — DX: Encounter for other specified aftercare: Z51.89

## 2011-07-24 LAB — CBC
Hemoglobin: 7.9 g/dL — ABNORMAL LOW (ref 12.0–15.0)
MCH: 30.2 pg (ref 26.0–34.0)
MCH: 30.2 pg (ref 26.0–34.0)
MCHC: 33.5 g/dL (ref 30.0–36.0)
MCHC: 33.3 g/dL (ref 30.0–36.0)
MCV: 90.3 fL (ref 78.0–100.0)
Platelets: 149 10*3/uL — ABNORMAL LOW (ref 150–400)
Platelets: 140 10*3/uL — ABNORMAL LOW (ref 150–400)
RDW: 15 % (ref 11.5–15.5)
RDW: 15.1 % (ref 11.5–15.5)

## 2011-07-24 LAB — GLUCOSE, CAPILLARY

## 2011-07-24 LAB — TYPE AND SCREEN

## 2011-07-24 LAB — CREATININE, SERUM
Creatinine, Ser: 1.84 mg/dL — ABNORMAL HIGH (ref 0.50–1.10)
GFR calc non Af Amer: 26 mL/min — ABNORMAL LOW (ref >90)

## 2011-07-24 SURGERY — JV LEFT HEART CATHETERIZATION WITH CORONARY ANGIOGRAM
Anesthesia: Moderate Sedation

## 2011-07-24 MED ORDER — SODIUM BICARBONATE 8.4 % IV SOLN
INTRAVENOUS | Status: AC
  Administered 2011-07-24: 09:00:00 via INTRAVENOUS

## 2011-07-24 MED ORDER — ASPIRIN EC 81 MG PO TBEC
81.0000 mg | DELAYED_RELEASE_TABLET | Freq: Every day | ORAL | Status: DC
  Administered 2011-07-26 – 2011-07-29 (×4): 81 mg via ORAL
  Filled 2011-07-24 (×5): qty 1

## 2011-07-24 MED ORDER — PEG 3350-KCL-NA BICARB-NACL 420 G PO SOLR
4000.0000 mL | Freq: Once | ORAL | Status: AC
  Administered 2011-07-24: 4000 mL via ORAL
  Filled 2011-07-24: qty 4000

## 2011-07-24 MED ORDER — ASPIRIN 81 MG PO CHEW: 81.0000 mg | CHEWABLE_TABLET | Freq: Every day | ORAL | Status: DC

## 2011-07-24 MED ORDER — ACETAMINOPHEN 325 MG PO TABS
650.0000 mg | ORAL_TABLET | Freq: Once | ORAL | Status: AC
  Administered 2011-07-24: 650 mg via ORAL
  Filled 2011-07-24: qty 2

## 2011-07-24 MED ORDER — ROSUVASTATIN CALCIUM 10 MG PO TABS
10.0000 mg | ORAL_TABLET | Freq: Every day | ORAL | Status: DC
  Administered 2011-07-24 – 2011-07-29 (×6): 10 mg via ORAL
  Filled 2011-07-24 (×6): qty 1

## 2011-07-24 MED ORDER — SODIUM BICARBONATE 8.4 % IV SOLN: INTRAVENOUS | Status: DC

## 2011-07-24 MED ORDER — SODIUM BICARBONATE 8.4 % IV SOLN
INTRAVENOUS | Status: DC
  Filled 2011-07-24: qty 500

## 2011-07-24 MED ORDER — ASPIRIN 81 MG PO CHEW
324.0000 mg | CHEWABLE_TABLET | ORAL | Status: AC
  Administered 2011-07-24: 324 mg via ORAL

## 2011-07-24 MED ORDER — HYDROCODONE-ACETAMINOPHEN 5-325 MG PO TABS: 1.0000 | ORAL_TABLET | Freq: Four times a day (QID) | ORAL | Status: DC | PRN

## 2011-07-24 MED ORDER — MORPHINE SULFATE 2 MG/ML IJ SOLN: 1.0000 mg | INTRAMUSCULAR | Status: DC | PRN

## 2011-07-24 MED ORDER — NITROGLYCERIN 0.4 MG SL SUBL
0.4000 mg | SUBLINGUAL_TABLET | SUBLINGUAL | Status: DC | PRN
  Administered 2011-07-26 (×2): 0.4 mg via SUBLINGUAL
  Filled 2011-07-24: qty 25

## 2011-07-24 MED ORDER — ONDANSETRON HCL 4 MG/2ML IJ SOLN: 4.0000 mg | Freq: Four times a day (QID) | INTRAMUSCULAR | Status: DC | PRN

## 2011-07-24 MED ORDER — DIAZEPAM 5 MG PO TABS
5.0000 mg | ORAL_TABLET | ORAL | Status: AC
  Administered 2011-07-24: 5 mg via ORAL

## 2011-07-24 MED ORDER — SODIUM BICARBONATE 8.4 % IV SOLN
INTRAVENOUS | Status: AC
  Administered 2011-07-24: 10:00:00 via INTRAVENOUS

## 2011-07-24 MED ORDER — DEXTROSE 5 % IV SOLN: INTRAVENOUS | Status: DC

## 2011-07-24 MED ORDER — SODIUM CHLORIDE 0.9 % IV SOLN: 1.0000 mL/kg/h | INTRAVENOUS | Status: DC

## 2011-07-24 MED ORDER — ZOLPIDEM TARTRATE 5 MG PO TABS: 5.0000 mg | ORAL_TABLET | Freq: Every evening | ORAL | Status: DC | PRN

## 2011-07-24 MED ORDER — METOPROLOL TARTRATE 25 MG PO TABS
25.0000 mg | ORAL_TABLET | Freq: Two times a day (BID) | ORAL | Status: DC
  Administered 2011-07-24 – 2011-07-27 (×8): 25 mg via ORAL
  Filled 2011-07-24 (×11): qty 1

## 2011-07-24 MED ORDER — LINAGLIPTIN 5 MG PO TABS
5.0000 mg | ORAL_TABLET | Freq: Every day | ORAL | Status: DC
  Administered 2011-07-24 – 2011-07-29 (×6): 5 mg via ORAL
  Filled 2011-07-24 (×6): qty 1

## 2011-07-24 MED ORDER — ACETAMINOPHEN 325 MG PO TABS: 650.0000 mg | ORAL_TABLET | ORAL | Status: DC | PRN

## 2011-07-24 MED ORDER — DIPHENHYDRAMINE HCL 25 MG PO CAPS
25.0000 mg | ORAL_CAPSULE | Freq: Once | ORAL | Status: AC
  Administered 2011-07-24: 25 mg via ORAL
  Filled 2011-07-24: qty 1

## 2011-07-24 MED ORDER — ALLOPURINOL 100 MG PO TABS
100.0000 mg | ORAL_TABLET | Freq: Every day | ORAL | Status: DC
  Administered 2011-07-24 – 2011-07-29 (×6): 100 mg via ORAL
  Filled 2011-07-24 (×6): qty 1

## 2011-07-24 MED ORDER — ACETAMINOPHEN 325 MG PO TABS
650.0000 mg | ORAL_TABLET | ORAL | Status: DC | PRN
  Administered 2011-07-26 – 2011-07-28 (×5): 650 mg via ORAL
  Filled 2011-07-24 (×5): qty 2

## 2011-07-24 MED ORDER — ALPRAZOLAM 0.25 MG PO TABS: 0.2500 mg | ORAL_TABLET | Freq: Two times a day (BID) | ORAL | Status: DC | PRN

## 2011-07-24 MED ORDER — INSULIN ASPART 100 UNIT/ML ~~LOC~~ SOLN
0.0000 [IU] | SUBCUTANEOUS | Status: DC
  Administered 2011-07-25: 2 [IU] via SUBCUTANEOUS
  Filled 2011-07-24: qty 3

## 2011-07-24 MED ORDER — SODIUM BICARBONATE 8.4 % IV SOLN
INTRAVENOUS | Status: DC
  Filled 2011-07-24: qty 1000

## 2011-07-24 NOTE — OR Nursing (Signed)
Report called to Janett Billow, [place] on [phone]

## 2011-07-24 NOTE — H&P (Addendum)
Reason for admission   1. SEVERE FATIGUE WITH EXERTION, CAD, anemia    History of Present Illness  General:  71 y/o who has had LE edema, PVD. She has had DOE for 2-3 years. She has chest pains at times and has frequent nausea with some vomiting. Chest pain feels like a pressure. It comes on with any activity. She has had tachycardia in the past as well. She wakes up sometimes at night with chest pain. She sleeps a lot during the day. Can't breath with lying flat. BP normally ok at home.   Current Medications  Tylenol Extra Strength 500 MG Tablet 1-2 tablet as needed daily  Fish Oil 1000 MG Capsule 2 capsules daily  B Complex one tablet Tablet daily   Aspirin 325 MG Tablet 1 tablet Once a day  Colchicine 0.6 MG Tablet i po bid prn as directed  Vitamin D3 1000 UNIT Capsule 4 capsules Once a day  Nabumetone 750 MG Tablet 1 tablet in pm as needed  Lasix 20 MG Tablet 1 tablet as needed for leg swelling  Toprol XL 50 MG Tablet Extended Release 24 Hour 1 tablet Once a day  Citalopram Hydrobromide 20 MG Tablet 1 tablet prn  Diovan HCT 160-12.5 MG Tablet 1 tablet Once a day  Allopurinol 100 MG Tablet 1 tablet qd  Vicodin 5-500 MG Tablet 1 tablet as needed for pain 8 hours  Januvia 50 MG Tablet daily qd  Metformin HCl 1000 MG Tablet 1 tablet with meals Twice a day  Prilosec OTC 20 MG Tablet Delayed Release 1 tablet prn  Hydrocodone-Acetaminophen 5-325 MG Tablet 1-2 tablet as needed for pain once a day  Medication List reviewed and reconciled with the patient   Past Medical History  Type 2 diabetes mellitus  Hypertension  Gout  Hyperlipidemia  GERD  Peripheral neuropathy  History of bacterial overgrowth causing diarrhea  carotid artery disease with left carotid endarterectomy and 50-60% right carotid artery stenosis, followed by Dr. Joylene Igo  Renal insufficiency with creatinine 1.6-1.8  history of peptic ulcer disease with negative H. 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 Rodell Perna  History of palpitations that resolved on metoprolol therapy  Fatty food intolerance in 1996 with normal ultrasounds  History of colon polyps  question of CVA in the distant past with no residual symptoms   Surgical History  right carpal tunnel release   tonsillectomy   left carotid endarterectomy, 2009, Dr. Joylene Igo    Family History  Mother: deceased 15 yrs MI   Lung cancer in her father who died at age 66 mother had hypertension diabetes and heart disease; no siblings.   Social History  General:  History of smoking  cigarettes: Never smoked no Alcohol.  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 One child was born without an esophagus and had transposition of part of the colon to repair. The daughter also has ITP.[name]    Allergies  Mycins: Hallucintions  Beta Blockers: Fatigue  Thiazides: Gout  sulfa: psychiatric disturbances  Norvasc: edema   Review of Systems  FOLLOW-UP ROS:  Gastroenterology: denies nausea/vomiting. General: denies, fever, chills. GYN/GU no dysuria. Neurology: no focal deficits.  Chest discomfort, shortness of breath with exertion as noted above. All other systems negative.  No bright red blood per rectum, no melena   Vital Signs  Wt 203.2, Wt change  3.2 lb, Ht 64, BMI 34.88, Pulse sitting 76, BP sitting 150/90.   Examination  General Examination: GENERAL APPEARANCE alert, oriented, NAD, pleasant.  SKIN: normal, no rash.  HEAD: [place]/AT.  EYES: EOMI, Conjunctiva clear, no drainage.  NECK: supple, no bruits, no JVD.  LUNGS: clear to auscultation bilaterally, no wheezes, rhonchi, rales.  HEART: no murmurs, regular rate and rhythm, normal S1S2.  ABDOMEN: soft and not tender, non-distended, no masses palpated.  BACK: unremarkable.  EXTREMITIES: no edema.  NEUROLOGIC EXAM: non-focal exam.  PERIPHERAL PULSES: normal (2+)  bilaterally PT pulses.  PSYCH appropriate mood and affect .     Assessments   1. Angina Pectoris - 413.9 (Primary)   2. Essential hypertension, benign - 401.1   3. Shortness of breath - 786.05   Treatment  1. Angina Pectoris   Cardiac cath significant for severe two-vessel disease.  Heavy calcification of the proximal LAD with a 90% stenosis.  Focal 80% stenosis of the large dominant right coronary artery.  Given that she has renal insufficiency and anemia with hemoglobin today being 7.8, we will admit the patient.  She is not a good candidate for long-term Plavix.  She is not a good candidate for a prolonged PCI with Rotablator which is what would be needed to revascularize the LAD.  Therefore, will consult cardiac surgeons regarding possible bypass surgery.     2. Essential hypertension, benign   stop Diovan HCT Tablet, 160-12.5 MG, 1 tablet, Orally, Once a day Continue Toprol XL Tablet Extended Release 24 Hour, 50 MG, 1 tablet, Orally, Once a day Controlled at home.  Will hold the ARB given renal function.    3. Shortness of breath   echocardiogram done within the last few weeks in our office.     4. anemia   seems to be getting worse.  Hemoglobin now around 8.  Type and screen is ordered and done.  We'll plan for transfusion and GI consultation as an inpatient.  We'll Hemoccult stool.

## 2011-07-24 NOTE — Op Note (Signed)
PROCEDURE:  Left heart catheterization with selective coronary angiography.  INDICATIONS:  Abnormal stress test.  Angina.  The risks, benefits, and details of the procedure were explained to the patient.  The patient verbalized understanding and wanted to proceed.  Informed written consent was obtained.  PROCEDURE TECHNIQUE:  After Xylocaine anesthesia a 73F sheath was placed in the right femoral artery with a single anterior needle wall stick.   Left coronary angiography was done using a Judkins L4 guide catheter.  Right coronary angiography was done using a Judkins R4 guide catheter.  Left heart cath was done using a pigtail catheter.    CONTRAST:  Total of 30 cc.  COMPLICATIONS:  None.    HEMODYNAMICS:  Aortic pressure was 147/57 and, mean aortic pressure 92; LV pressure was 149/8; LVEDP 15.  There was no gradient between the left ventricle and aorta.    ANGIOGRAPHIC DATA:   The left main coronary artery is widely patent.  The left anterior descending artery is heavily calcified in the proximal vessel.  There is a focal 90% proximal LAD lesion.  There is moderate disease in the mid to distal LAD up to 40%.  The left circumflex artery is a large vessel.  There is a large branching OM one across the lateral wall which appears widely patent.  The remainder the circumflex is a medium-sized vessel.  This branches into a distal obtuse marginal which is small.  At the bifurcation there is a 90% stenosis affecting both terminal branches of the circumflex.  These terminal branches are quite small.  The right coronary artery is a large dominant vessel.  There is a focal 80% proximal stenosis.  The posterior descending artery and posterolateral artery appear widely patent.  LEFT VENTRICULOGRAM:  Left ventricular angiogram was not performed to minimize contrast exposure. LVEDP was 15 mmHg.  IMPRESSIONS:  1. Normal left main coronary artery. 2. Severely diseased proximal LAD with heavy  calcification.  Most severe stenosis of 90%. 3. Normal proximal to mid left circumflex artery and its branches.  The small,  terminal branches of the circumflex had a 90% stenosis. 4.  Large dominant right coronary artery with a focal proximal 80% stenosis.   5.  LVEDP 15  mmHg.  Ejection fraction not assessed to minimize contrast exposure.  RECOMMENDATION:  We'll have to discuss with the family regarding the best strategy for revascularization.  The right coronary artery would be a straightforward PCI.  The LAD however is heavily calcified and will likely need rotational atherectomy.  The terminal branches of the distal circumflex have a 90% stenosis.  These would be too small to revascularize.  Everything is complicated by the patient's anemia and renal insufficiency.  A prolonged PCI could be harmful to her kidneys.  We'll have to discuss surgery is an option as well.

## 2011-07-24 NOTE — Consult Note (Signed)
[facility] Reason for consult: Anemia Referring Physician: Dr. Sherrilyn Rist [name] is an 71 y.o. female.  HPI: Pleasant woman who sees Dr. Inda Merlin on a regular basis and was recently found to be anemic. Her hemoglobin normally runs about 10 and had dropped down to 9.3. She had chest pain and saw Dr. Irish Lack who performed a cardiac catheterization today. Her preop labs the catheterization revealed the anemia her hemoglobin appears to have dropped even further. She has been seen by Dr. Irish Lack and by Dr. Caffie Pinto. The feeling is that but other issues are resolved that it would be best if she undergoes CABG. The patient states that she does not take anything on routine basis for heartburn or indigestion. She has had bleeding ulcers in the 1970s and was treated with Tagamet. She's had previous colonoscopies by Dr. Velora Heckler the last being a number of years ago before Dr. Velora Heckler retired. She thinks she may have had polyps. She denies epigastric pain or dyspepsia but does note that she has been taking a lot of Aleve for the past several months for a number of aches and pains. Other problems include type 2 diabetes, peripheral neuropathy, osteoarthritis with multiple aches and pains, vitamin D sufficiency and chronic fatigue syndrome. Previous surgeries include prior left carotid endarterectomy tonsillectomy carpal tunnel release  No past medical history on file.  No past surgical history on file.  No family history on file.  Social History:  does not have a smoking history on file. She does not have any smokeless tobacco history on file. Her alcohol and drug histories not on file. I have reviewed the patient's current medication. Allergies: No Known Allergies  Medications:    Results for orders placed during the hospital encounter of 07/24/11 (from the past 48 hour(s))  PREPARE RBC (CROSSMATCH)     Status: Normal   Collection Time   07/24/11  2:42 PM      Component Value  Range Comment   Order Confirmation ORDER PROCESSED BY BLOOD BANK     CBC     Status: Abnormal   Collection Time   07/24/11  3:55 PM      Component Value Range Comment   WBC 5.1  4.0 - 10.5 (K/uL)    RBC 2.62 (*) 3.87 - 5.11 (MIL/uL)    Hemoglobin 7.9 (*) 12.0 - 15.0 (g/dL)    HCT 23.7 (*) 36.0 - 46.0 (%)    MCV 90.5  78.0 - 100.0 (fL)    MCH 30.2  26.0 - 34.0 (pg)    MCHC 33.3  30.0 - 36.0 (g/dL)    RDW 15.1  11.5 - 15.5 (%)    Platelets 140 (*) 150 - 400 (K/uL)     No results found.    Blood pressure 150/57, pulse 72, temperature 98.2 F (36.8 C), resp. rate 16, SpO2 98.00%.  Physical exam:  General-pleasant white female in no acute distress Eyes are nonicteric Lungs-clear Heart regular rate and rhythm without murmurs or gallops Abdomen-soft and nontender   Assessment/Plan: 1. Anemia-the patient does have a history of colon polyps needs a colonoscopy. With the amount of Aleve that she has been using we will try go ahead and look in her stomach as well. I discussed this with the patient. She is tentatively scheduled for 2:30 tomorrow. We have discussed the procedures with her.  [name],Chalee Hirota L 07/24/2011, 4:42 PM

## 2011-07-24 NOTE — OR Nursing (Signed)
Transported to 3700 via stretcher on monitor without change

## 2011-07-24 NOTE — Consult Note (Signed)
[address]            [name]          [phone]      Reason for Consult: Severe two-vessel coronary disease with unstable angina Referring Physician: Casandra Doffing, MD  [name] is an 71 y.o. female.  HPI:   She is a 71 year old woman with a history of diabetes, hypertension, and hyperlipidemia as well as cerebrovascular disease status post left carotid endarterectomy, and known chronic kidney disease with a creatinine of 1.6-1.8. She presents with about a one-year history of episodic substernal chest discomfort associated with shortness of breath and exertional fatigue. These episodes are becoming more frequent and recently have been occurring at rest. Over the past week she has taken multiple sublingual nitroglycerin tablets for relief. She underwent cardiac catheterization today which showed high-grade proximal LAD and diagonal stenosis as well as a high-grade proximal right coronary stenosis. She was also noted to have anemia with a hemoglobin around 7.8.   Past Medical History    Type 2 diabetes mellitus   Hypertension   Gout   Hyperlipidemia   GERD   Peripheral neuropathy   History of bacterial overgrowth causing diarrhea   carotid artery disease with left carotid endarterectomy and 50-60% right carotid artery stenosis, followed by Dr. Joylene Espinoza   Renal insufficiency with creatinine 1.6-1.8   history of peptic ulcer disease with negative H. 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 Rodell Espinoza   History of palpitations that resolved on metoprolol therapy   Fatty food intolerance in 1996 with normal ultrasounds   History of colon polyps   question of CVA in the distant past with no residual symptoms      Surgical History   right carpal tunnel release    tonsillectomy    left carotid endarterectomy, 2009, Dr. Joylene Espinoza       Family History   Mother: deceased  4 yrs MI    Lung cancer in her father who died at age 34 mother had hypertension diabetes and heart disease; no siblings.      Social History   General:  History of smoking  cigarettes: Never smoked no Alcohol.      Allergies: No Known Allergies  Medications:  I have reviewed the patient's current medications. Prior to Admission:  Prescriptions prior to admission  Medication Sig Dispense Refill  . acetaminophen (TYLENOL) 500 MG tablet Take 500 mg by mouth every 6 (six) hours as needed. For pain      . allopurinol (ZYLOPRIM) 100 MG tablet Take 100 mg by mouth daily.      Marland Kitchen aspirin 325 MG tablet Take 325 mg by mouth daily.      . B Complex Vitamins (VITAMIN B COMPLEX PO) Take 1 tablet by mouth daily.      . brimonidine (ALPHAGAN P) 0.1 % SOLN Place 1 drop into the left eye 2 (two) times daily.      . Cholecalciferol (VITAMIN D) 2000 UNITS tablet Take 2,000 Units by mouth 2 (two) times daily.      . citalopram (CELEXA) 20 MG tablet Take 20 mg by mouth daily.      . colchicine 0.6 MG tablet Take 0.6 mg by mouth 2 (two) times daily. gout      .  fish oil-omega-3 fatty acids 1000 MG capsule Take 2 g by mouth daily.      . furosemide (LASIX) 20 MG tablet Take 20 mg by mouth daily.      . metFORMIN (GLUCOPHAGE) 1000 MG tablet Take 1,000 mg by mouth 2 (two) times daily with a meal.      . metoprolol (TOPROL-XL) 50 MG 24 hr tablet Take 50 mg by mouth daily.      Marland Kitchen omeprazole (PRILOSEC) 20 MG capsule Take 20 mg by mouth daily as needed. Acid reflux      . sitaGLIPtin (JANUVIA) 50 MG tablet Take 50 mg by mouth daily.      . timolol (TIMOPTIC) 0.5 % ophthalmic solution Place 1 drop into the left eye daily.      . travoprost, benzalkonium, (TRAVATAN) 0.004 % ophthalmic solution Place 1 drop into the left eye at bedtime.       Scheduled:   . acetaminophen  650 mg Oral Once  . allopurinol  100 mg Oral Daily  . aspirin EC  81 mg Oral Daily  . diphenhydrAMINE  25 mg Oral Once  . insulin aspart   0-9 Units Subcutaneous Q4H  . linagliptin  5 mg Oral Daily  . metoprolol tartrate  25 mg Oral BID  . rosuvastatin  10 mg Oral Daily   Continuous:  [id number], ALPRAZolam, HYDROcodone-acetaminophen, nitroGLYCERIN, ondansetron (ZOFRAN) IV, zolpidem  Results for orders placed during the hospital encounter of 07/24/11 (from the past 48 hour(s))  PREPARE RBC (CROSSMATCH)     Status: Normal   Collection Time   07/24/11  2:42 PM      Component Value Range Comment   Order Confirmation ORDER PROCESSED BY BLOOD BANK       BMET    Component Value Date/Time   NA 135 04/28/2008 0400   K 5.2* 04/28/2008 0400   CL 110 04/28/2008 0400   CO2 19 04/28/2008 0400   GLUCOSE 192* 04/28/2008 0400   BUN 29* 04/28/2008 0400   CREATININE 1.75* 04/28/2008 0400   CALCIUM 7.1* 04/28/2008 0400   GFRNONAA 29* 04/28/2008 0400   GFRAA  Value: 35        The eGFR has been calculated using the MDRD equation. This calculation has not been validated in all clinical* 04/28/2008 0400    CBC    Component Value Date/Time   WBC 5.9 07/24/2011 1148   RBC 2.58* 07/24/2011 1148   HGB 7.8* 07/24/2011 1148   HCT 23.3* 07/24/2011 1148   PLT 149* 07/24/2011 1148   MCV 90.3 07/24/2011 1148   MCH 30.2 07/24/2011 1148   MCHC 33.5 07/24/2011 1148   RDW 15.0 07/24/2011 1148      Review of Systems  Constitutional: Positive for malaise/fatigue. Negative for fever, chills, weight loss and diaphoresis.  HENT: Negative.   Eyes: Negative.   Respiratory: Negative.   Cardiovascular: Positive for chest pain and leg swelling. Negative for palpitations, orthopnea, claudication and PND.  Gastrointestinal: Negative for nausea, vomiting, abdominal pain, diarrhea, constipation, blood in stool and melena.  Genitourinary: Negative.   Musculoskeletal: Negative.   Skin: Negative.   Neurological: Positive for tingling. Negative for dizziness, focal weakness and weakness.       Numbness in feet  Endo/Heme/Allergies: Does not bruise/bleed  easily.  Psychiatric/Behavioral: Negative.    Pulse 73, temperature 98.2 F (36.8 C), resp. rate 16, SpO2 98.00%. Physical Exam  Constitutional: She is oriented to person, place, and time. She appears well-developed and well-nourished. No distress.  HENT:  Head: Normocephalic and atraumatic.  Mouth/Throat: Oropharynx is clear and moist.  Eyes: EOM are normal. Pupils are equal, round, and reactive to light.  Neck: Normal range of motion. Neck supple. No JVD present. No thyromegaly present.  Cardiovascular: Normal rate, regular rhythm and intact distal pulses.  Exam reveals no gallop and no friction rub.   No murmur heard. Respiratory: Breath sounds normal. No respiratory distress.  GI: Soft. Bowel sounds are normal. She exhibits no mass. There is no tenderness.  Musculoskeletal: She exhibits edema.  Lymphadenopathy:    She has no cervical adenopathy.  Neurological: She is alert and oriented to person, place, and time. She has normal strength. A sensory deficit is present. No cranial nerve deficit.       Decreased sensation in both feet    Cardiac Cath:  HEMODYNAMICS:  Aortic pressure was 147/57 and, mean aortic pressure 92; LV pressure was 149/8; LVEDP 15.  There was no gradient between the left ventricle and aorta.    ANGIOGRAPHIC DATA:   The left main coronary artery is widely patent.  The left anterior descending artery is heavily calcified in the proximal vessel.  There is a focal 90% proximal LAD lesion.  There is moderate disease in the mid to distal LAD up to 40%.  The left circumflex artery is a large vessel.  There is a large branching OM one across the lateral wall which appears widely patent.  The remainder the circumflex is a medium-sized vessel.  This branches into a distal obtuse marginal which is small.  At the bifurcation there is a 90% stenosis affecting both terminal branches of the circumflex.  These terminal branches are quite small.  The right coronary artery is a  large dominant vessel.  There is a focal 80% proximal stenosis.  The posterior descending artery and posterolateral artery appear widely patent.  LEFT VENTRICULOGRAM:  Left ventricular angiogram was not performed to minimize contrast exposure. LVEDP was 15 mmHg.  IMPRESSIONS:    1. Normal left main coronary artery.  2. Severely diseased proximal LAD with heavy calcification.  Most severe stenosis of 90%.  3. Normal proximal to mid left circumflex artery and its branches.  The small,  terminal branches of the circumflex had a 90% stenosis.  4. Large dominant right coronary artery with a focal proximal 80% stenosis.     5. LVEDP 15  mmHg.  Ejection fraction not assessed to minimize contrast exposure. 6.  Assessment/Plan:  She has high-grade two-vessel coronary disease involving the LAD and right coronary systems. She now has unstable anginal symptoms and I agree that coronary bypass graft surgery is the best treatment. She also has significant anemia with no obvious blood loss and is going to be seen by GI medicine for evaluation. She does have chronic renal insufficiency with a creatinine reported to be 1.6-1.8. I would plan to do her surgery during this admission after evaluation by GI medicine. I discussed the operative procedure with the patient and family including alternatives, benefits and risks; including but not limited to bleeding, blood transfusion, infection, stroke, myocardial infarction, graft failure, heart block requiring a permanent pacemaker, organ dysfunction, renal failure requiring dialysis and death.  [name] understands and agrees to proceed.  We will schedule surgery for Friday or Monday depending on her GI workup and renal function.  Gaye Pollack 07/24/2011, 3:38 PM

## 2011-07-24 NOTE — OR Nursing (Signed)
Tegaderm dressing applied, site intact, level 0, bedrest begins at 1155

## 2011-07-24 NOTE — OR Nursing (Signed)
Dr Irish Lack at bedside to discuss results and treatment plan with pt and family

## 2011-07-24 NOTE — OR Nursing (Signed)
Meal served 

## 2011-07-25 ENCOUNTER — Other Ambulatory Visit: Payer: Self-pay

## 2011-07-25 ENCOUNTER — Inpatient Hospital Stay (HOSPITAL_COMMUNITY): Payer: Medicare Other

## 2011-07-25 ENCOUNTER — Other Ambulatory Visit (HOSPITAL_COMMUNITY): Payer: Self-pay | Admitting: Radiology

## 2011-07-25 ENCOUNTER — Encounter (HOSPITAL_COMMUNITY)
Admission: AD | Disposition: A | Discharge status: Home / Self Care | Payer: Self-pay | Source: Ambulatory Visit | Attending: Interventional Cardiology

## 2011-07-25 ENCOUNTER — Encounter (HOSPITAL_BASED_OUTPATIENT_CLINIC_OR_DEPARTMENT_OTHER): Payer: Self-pay

## 2011-07-25 ENCOUNTER — Encounter (HOSPITAL_COMMUNITY): Payer: Self-pay | Admitting: Gastroenterology

## 2011-07-25 DIAGNOSIS — Z01810 Encounter for preprocedural cardiovascular examination: Secondary | ICD-10-CM

## 2011-07-25 DIAGNOSIS — I2510 Atherosclerotic heart disease of native coronary artery without angina pectoris: Secondary | ICD-10-CM

## 2011-07-25 HISTORY — PX: COLONOSCOPY: SHX5424

## 2011-07-25 HISTORY — PX: ESOPHAGOGASTRODUODENOSCOPY: SHX5428

## 2011-07-25 LAB — BASIC METABOLIC PANEL
BUN: 23 mg/dL (ref 6–23)
CO2: 22 mEq/L (ref 19–32)
GFR calc non Af Amer: 33 mL/min — ABNORMAL LOW (ref >90)
Glucose, Bld: 149 mg/dL — ABNORMAL HIGH (ref 70–99)
Potassium: 3.9 mEq/L (ref 3.5–5.1)
Sodium: 140 mEq/L (ref 135–145)

## 2011-07-25 LAB — GLUCOSE, CAPILLARY
Glucose-Capillary: 135 mg/dL — ABNORMAL HIGH (ref 70–99)
Glucose-Capillary: 136 mg/dL — ABNORMAL HIGH (ref 70–99)
Glucose-Capillary: 152 mg/dL — ABNORMAL HIGH (ref 70–99)

## 2011-07-25 LAB — CBC
HCT: 32.3 % — ABNORMAL LOW (ref 36.0–46.0)
Hemoglobin: 10.6 g/dL — ABNORMAL LOW (ref 12.0–15.0)
MCV: 91.2 fL (ref 78.0–100.0)
WBC: 7.2 10*3/uL (ref 4.0–10.5)

## 2011-07-25 LAB — HEMOGLOBIN A1C: Mean Plasma Glucose: 137 mg/dL — ABNORMAL HIGH (ref <117)

## 2011-07-25 SURGERY — EGD (ESOPHAGOGASTRODUODENOSCOPY)
Anesthesia: Moderate Sedation

## 2011-07-25 MED ORDER — INSULIN ASPART 100 UNIT/ML ~~LOC~~ SOLN
0.0000 [IU] | Freq: Three times a day (TID) | SUBCUTANEOUS | Status: DC
  Administered 2011-07-26: 2 [IU] via SUBCUTANEOUS
  Administered 2011-07-26: 3 [IU] via SUBCUTANEOUS
  Administered 2011-07-26: 2 [IU] via SUBCUTANEOUS
  Administered 2011-07-27: 5 [IU] via SUBCUTANEOUS
  Administered 2011-07-27 – 2011-07-28 (×3): 7 [IU] via SUBCUTANEOUS
  Administered 2011-07-28 – 2011-07-29 (×3): 3 [IU] via SUBCUTANEOUS
  Filled 2011-07-25: qty 3

## 2011-07-25 MED ORDER — ALBUTEROL SULFATE (5 MG/ML) 0.5% IN NEBU
2.5000 mg | INHALATION_SOLUTION | Freq: Once | RESPIRATORY_TRACT | Status: AC
  Administered 2011-07-25: 2.5 mg via RESPIRATORY_TRACT

## 2011-07-25 MED ORDER — MIDAZOLAM HCL 10 MG/2ML IJ SOLN
INTRAMUSCULAR | Status: AC
  Filled 2011-07-25: qty 2

## 2011-07-25 MED ORDER — MIDAZOLAM HCL 5 MG/5ML IJ SOLN
INTRAMUSCULAR | Status: DC | PRN
  Administered 2011-07-25: 2 mg via INTRAVENOUS

## 2011-07-25 MED ORDER — FENTANYL NICU IV SYRINGE 50 MCG/ML
INJECTION | INTRAMUSCULAR | Status: DC | PRN
  Administered 2011-07-25 (×3): 25 ug via INTRAVENOUS

## 2011-07-25 MED ORDER — FENTANYL CITRATE 0.05 MG/ML IJ SOLN
INTRAMUSCULAR | Status: AC
  Filled 2011-07-25: qty 2

## 2011-07-25 MED ORDER — MIDAZOLAM HCL 10 MG/2ML IJ SOLN
INTRAMUSCULAR | Status: DC | PRN
  Administered 2011-07-25 (×2): 1 mg via INTRAVENOUS
  Administered 2011-07-25: 2 mg via INTRAVENOUS

## 2011-07-25 MED ORDER — SODIUM CHLORIDE 0.9 % IV SOLN
INTRAVENOUS | Status: DC
  Administered 2011-07-25: 500 mL via INTRAVENOUS

## 2011-07-25 NOTE — Op Note (Signed)
[facility] [place], [place]  [id number]  ENDOSCOPY PROCEDURE REPORT  PATIENT:  Debra, Espinoza  MR#:  [id number] BIRTHDATE:  01/13/1941, 70 yrs. old  GENDER:  female  ENDOSCOPIST:  Laurence Spates Referred by:  Dr Irish Lack  PROCEDURE DATE:  07/25/2011 PROCEDURE:  EGD, diagnostic 43235 ASA CLASS:  Class III INDICATIONS:  anemia, FOBT + stool  MEDICATIONS:   Versed 4 mg IV, Fentanyl 50 mcg IV TOPICAL ANESTHETIC:  Cetacaine Spray  DESCRIPTION OF PROCEDURE:   After the risks and benefits of the procedure were explained, informed consent was obtained.  The Pentax Gastroscope [id number] endoscope was introduced through the mouth and advanced to the second portion of the duodenum.  The instrument was slowly withdrawn as the mucosa was fully examined. <<PROCEDUREIMAGES>>  Mild gastritis was found antrum  Normal duodenum without inflammation, mass, ulcer, AVM, or other pathology. in the bulb and descending duodenum.  nl esophagus in the total esophagus. Retroflexed views revealed no abnormalities.    The scope was then withdrawn from the patient and the procedure completed.  COMPLICATIONS:  A complication of none occurred on 07/25/2011 at.  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 eSIGNEDLaurence Spates at 07/25/2011 02:59 PM  Linnell Fulling, [id number]

## 2011-07-25 NOTE — Progress Notes (Signed)
SUBJECTIVE:  Feels ok. No chest pain.  Tolerated prep for colonoscopy.  OBJECTIVE:   Vitals:   Filed Vitals:   07/24/11 2100 07/24/11 2116 07/24/11 2146 07/25/11 0500  BP: 153/54 150/55 140/71 144/78  Pulse: 70 71 67 67  Temp: 98.4 F (36.9 C) 98.4 F (36.9 C) 98.3 F (36.8 C) 98.4 F (36.9 C)  TempSrc:   Oral Oral  Resp: 20 18 18 18   SpO2:   96% 96%   I&O's:   Intake/Output Summary (Last 24 hours) at 07/25/11 1042 Last data filed at 07/24/11 2100  Gross per 24 hour  Intake    350 ml  Output      0 ml  Net    350 ml   TELEMETRY: Reviewed telemetry pt in NSR:     PHYSICAL EXAM General: Well developed, well nourished, in no acute distress Head: Eyes PERRLA, No xanthomas.   Normal cephalic and atramatic  Lungs:   Clear bilaterally to auscultation and percussion. Heart:   HRRR S1 S2 Abdomen: obese Msk:  . Normal strength and tone for age. Extremities:   No cyanosis  Neuro: Alert and oriented X 3. Psych:  Good affect, responds appropriately   LABS: Basic Metabolic Panel:  Basename 07/25/11 0655 07/24/11 1555  NA 140 --  K 3.9 --  CL 103 --  CO2 22 --  GLUCOSE 149* --  BUN 23 --  CREATININE 1.55* 1.84*  CALCIUM 8.3* --  MG -- --  PHOS -- --   Liver Function Tests: No results found for this basename: AST:2,ALT:2,ALKPHOS:2,BILITOT:2,PROT:2,ALBUMIN:2 in the last 72 hours No results found for this basename: LIPASE:2,AMYLASE:2 in the last 72 hours CBC:  Basename 07/25/11 0655 07/24/11 1555  WBC 7.2 5.1  NEUTROABS -- --  HGB 10.6* 7.9*  HCT 32.3* 23.7*  MCV 91.2 90.5  PLT 163 140*   Cardiac Enzymes: No results found for this basename: CKTOTAL:3,CKMB:3,CKMBINDEX:3,TROPONINI:3 in the last 72 hours BNP: No components found with this basename: POCBNP:3 D-Dimer: No results found for this basename: DDIMER:2 in the last 72 hours Hemoglobin A1C: No results found for this basename: HGBA1C in the last 72 hours Fasting Lipid Panel: No results found for this  basename: CHOL,HDL,LDLCALC,TRIG,CHOLHDL,LDLDIRECT in the last 72 hours Thyroid Function Tests: No results found for this basename: TSH,T4TOTAL,FREET3,T3FREE,THYROIDAB in the last 72 hours Anemia Panel: No results found for this basename: VITAMINB12,FOLATE,FERRITIN,TIBC,IRON,RETICCTPCT in the last 72 hours Coag Panel:   Lab Results  Component Value Date   INR 1.0 04/20/2008    RADIOLOGY: No results found.    ASSESSMENT: CAD, anemia  PLAN:  1) Plan for EGD, colonoscopy today.  Hbg significantly improved after one unit of blood.  2) bypass surgery planned for Friday or Monday depending on results of EGD.  3) creatinine down to 1.55 after aggressive hydration post catheterization.  Jettie Booze., MD  07/25/2011  10:42 AM

## 2011-07-25 NOTE — Progress Notes (Signed)
                   [address]            [place] [address]          [phone]      Day of Surgery Procedure(s) (LRB): ESOPHAGOGASTRODUODENOSCOPY (EGD) (N/A) COLONOSCOPY (N/A) Subjective: No chest pain or SOB.  Back from EGD and Colonoscopy  Objective: Vital signs in last 24 hours: Temp:  [98.3 F (36.8 C)-100.4 F (38 C)] 99 F (37.2 C) (01/10 1604) Pulse Rate:  [64-71] 67  (01/10 1604) Cardiac Rhythm:  [-]  Resp:  [12-47] 16  (01/10 1604) BP: (131-203)/(36-141) 189/78 mmHg (01/10 1604) SpO2:  [95 %-99 %] 96 % (01/10 1604)  Hemodynamic parameters for last 24 hours:    Intake/Output from previous day: 01/09 0701 - 01/10 0700 In: 350 [Blood:350] Out: -  Intake/Output this shift:    General appearance: alert and cooperative Heart: regular rate and rhythm, S1, S2 normal, no murmur, click, rub or gallop Lungs: clear to auscultation bilaterally  Lab Results:  Basename 07/25/11 0655 07/24/11 1555  WBC 7.2 5.1  HGB 10.6* 7.9*  HCT 32.3* 23.7*  PLT 163 140*   BMET:  Basename 07/25/11 0655 07/24/11 1555  NA 140 --  K 3.9 --  CL 103 --  CO2 22 --  GLUCOSE 149* --  BUN 23 --  CREATININE 1.55* 1.84*  CALCIUM 8.3* --    PT/INR: No results found for this basename: LABPROT,INR in the last 72 hours ABG No results found for this basename: phart, pco2, po2, hco3, tco2, acidbasedef, o2sat   CBG (last 3)   Basename 07/25/11 1153 07/25/11 0800 07/25/11 0402  GLUCAP 175* 152* 135*    Assessment/Plan: S/P Procedure(s) (LRB): ESOPHAGOGASTRODUODENOSCOPY (EGD) (N/A) COLONOSCOPY (N/A) EGD and Colonoscopy results discussed with Dr. Oletta Lamas.  There is no definate source of anemia on either study. I will not have time in schedule to do surgery until Monday due to more urgent cases in the hospital having pain on heparin and ntg.  My partners schedules are also full tomorrow.  I discussed this with patient and family and they are in agreement to proceed  Monday.  If she has any problems over the weekend she will need to be done at that time.   LOS: 1 day    Yaslyn Cumby K 07/25/2011

## 2011-07-25 NOTE — Progress Notes (Signed)
VASCULAR LAB PRELIMINARY  PRELIMINARY  PRELIMINARY  PRELIMINARY  Pre-op Cardiac Surgery  Carotid Findings:    Upper Extremity Right Left  Brachial Pressures 183 183  Radial Waveforms T T  Ulnar Waveforms T T  Palmar Arch (Allen's Test) WNL Doppler signal diminishes >50% with radial compression and remains normal with ulnar compression.   Findings:      Lower  Extremity Right Left  Dorsalis Pedis 250 [place] 295 Bipasic  Anterior Tibial    Posterior Tibial 257 287  Ankle/Brachial Indices >1.0 >1.0    Findings:  ABI's probably abnormal secondary to calcified waveforms.   [name], 07/25/2011, 11:33 AM

## 2011-07-25 NOTE — Op Note (Signed)
[facility] [address] [place], [place]  [address]  COLONOSCOPY PROCEDURE REPORT  PATIENT:  [name], [name]  MR#:  [id number] BIRTHDATE:  May 25, 1941, 61 yrs. old  GENDER:  female  ENDOSCOPIST:  Laurence Spates Referred by:  PROCEDURE DATE:  07/25/2011 PROCEDURE: COLONOSCOPY ASA CLASS: 3 INDICATIONS: Anemia  MEDICATIONS: Fentanyl 75 mcg, versed 6 mg IV TOPICAL ANESTHETIC:  DESCRIPTION OF PROCEDURE:   After the risks and benefits of the procedure were explained, informed consent was obtained.  The pediatric colonoscope was introuced  through the rectum following a digital rectal exam. We were able to easily advance to the cecum. The appendiceal orifice and ileocecal valve were identified. The scope was withdrawn. The colon was carefully examined on withdrawal. There were no AVMs, diverticula, masses or polyps seen. Scope was drawn into the rectum and retroflexed with a finding of internal hemorrhoids. Patient tolerated procedure well. Prep was quite good. <<PROCEDUREIMAGES>> .  COMPLICATIONS:  None  ENDOSCOPIC IMPRESSION: Normal colonoscopy other than internal hemorrhoids. No explanation for the patient's anemia  RECOMMENDATIONS: The patient should be able to go ahead with cardiac surgery as planned. We'll need to make sure that iron levels are adequate. Would recommend resuming Hemoccults in 3 years and repeat colonoscopy in 10 years.  ______________________________ Laurence Spates  CC:  n. eSIGNEDLaurence Spates at 07/25/2011 03:15 PM  Linnell Fulling, [id number]

## 2011-07-25 NOTE — H&P (Signed)
  Hospital notes reviewed.

## 2011-07-26 ENCOUNTER — Encounter (HOSPITAL_COMMUNITY): Payer: Self-pay | Admitting: Gastroenterology

## 2011-07-26 ENCOUNTER — Other Ambulatory Visit: Payer: Self-pay

## 2011-07-26 LAB — BASIC METABOLIC PANEL
Calcium: 8.8 mg/dL (ref 8.4–10.5)
Creatinine, Ser: 1.46 mg/dL — ABNORMAL HIGH (ref 0.50–1.10)
GFR calc Af Amer: 41 mL/min — ABNORMAL LOW (ref >90)
GFR calc non Af Amer: 35 mL/min — ABNORMAL LOW (ref >90)
Sodium: 139 mEq/L (ref 135–145)

## 2011-07-26 LAB — CBC
MCH: 30.5 pg (ref 26.0–34.0)
MCV: 92.4 fL (ref 78.0–100.0)
Platelets: 163 10*3/uL (ref 150–400)
RBC: 3.67 MIL/uL — ABNORMAL LOW (ref 3.87–5.11)
RDW: 14.7 % (ref 11.5–15.5)

## 2011-07-26 LAB — GLUCOSE, CAPILLARY
Glucose-Capillary: 191 mg/dL — ABNORMAL HIGH (ref 70–99)
Glucose-Capillary: 213 mg/dL — ABNORMAL HIGH (ref 70–99)

## 2011-07-26 MED ORDER — ALPRAZOLAM 0.25 MG PO TABS
0.2500 mg | ORAL_TABLET | ORAL | Status: DC | PRN
  Administered 2011-07-28: 0.25 mg via ORAL
  Filled 2011-07-26: qty 1

## 2011-07-26 MED ORDER — ISOSORBIDE MONONITRATE ER 30 MG PO TB24
30.0000 mg | ORAL_TABLET | Freq: Every day | ORAL | Status: DC
  Administered 2011-07-26 – 2011-07-29 (×4): 30 mg via ORAL
  Filled 2011-07-26 (×4): qty 1

## 2011-07-26 MED ORDER — AMLODIPINE BESYLATE 5 MG PO TABS
5.0000 mg | ORAL_TABLET | Freq: Every day | ORAL | Status: DC
  Administered 2011-07-26 – 2011-07-29 (×4): 5 mg via ORAL
  Filled 2011-07-26 (×5): qty 1

## 2011-07-26 NOTE — Progress Notes (Signed)
SUBJECTIVE:  Feels ok. No chest pain.  Tolerated colonoscopy.  No active bleeding found.  OBJECTIVE:   Vitals:   Filed Vitals:   07/25/11 2100 07/26/11 0500 07/26/11 1131 07/26/11 1135  BP: 150/67 153/93 185/79 185/79  Pulse: 96 76 82   Temp: 98.6 F (37 C) 98.1 F (36.7 C)    TempSrc: Oral Oral    Resp: 18 18    Height: 5\' 4"  (1.626 m)     Weight: 92.08 kg (203 lb)     SpO2: 95% 92%     I&O's:    Intake/Output Summary (Last 24 hours) at 07/26/11 1259 Last data filed at 07/26/11 0900  Gross per 24 hour  Intake    360 ml  Output      0 ml  Net    360 ml   TELEMETRY: Reviewed telemetry pt in NSR:     PHYSICAL EXAM General: Well developed, well nourished, in no acute distress Head: Eyes PERRLA, No xanthomas.   Normal cephalic and atramatic  Lungs:   Clear bilaterally to auscultation and percussion. Heart:   HRRR S1 S2 Abdomen: obese Msk:  . Normal strength and tone for age. Extremities:   No cyanosis  Neuro: Alert and oriented X 3. Psych:  Good affect, responds appropriately   LABS: Basic Metabolic Panel:  Basename 07/26/11 0945 07/25/11 0655  NA 139 140  K 3.8 3.9  CL 101 103  CO2 24 22  GLUCOSE 279* 149*  BUN 16 23  CREATININE 1.46* 1.55*  CALCIUM 8.8 8.3*  MG -- --  PHOS -- --   Liver Function Tests: No results found for this basename: AST:2,ALT:2,ALKPHOS:2,BILITOT:2,PROT:2,ALBUMIN:2 in the last 72 hours No results found for this basename: LIPASE:2,AMYLASE:2 in the last 72 hours CBC:  Basename 07/26/11 0945 07/25/11 0655  WBC 7.2 7.2  NEUTROABS -- --  HGB 11.2* 10.6*  HCT 33.9* 32.3*  MCV 92.4 91.2  PLT 163 163   Cardiac Enzymes: No results found for this basename: CKTOTAL:3,CKMB:3,CKMBINDEX:3,TROPONINI:3 in the last 72 hours BNP: No components found with this basename: POCBNP:3 D-Dimer: No results found for this basename: DDIMER:2 in the last 72 hours Hemoglobin A1C:  Basename 07/25/11 0655  HGBA1C 6.4*   Fasting Lipid Panel: No  results found for this basename: CHOL,HDL,LDLCALC,TRIG,CHOLHDL,LDLDIRECT in the last 72 hours Thyroid Function Tests: No results found for this basename: TSH,T4TOTAL,FREET3,T3FREE,THYROIDAB in the last 72 hours Anemia Panel: No results found for this basename: VITAMINB12,FOLATE,FERRITIN,TIBC,IRON,RETICCTPCT in the last 72 hours Coag Panel:   Lab Results  Component Value Date   INR 1.0 04/20/2008    RADIOLOGY: No results found.    ASSESSMENT: CAD, anemia  PLAN:  1) EGD, colonoscopy done.  Hbg significantly improved after one unit of blood.  No bleeding source found.  2) bypass surgery planned for Monday.  3) creatinine down to 1.48 after aggressive hydration post catheterization.  4) Restart Imdur and amlodipine. BP elevated today.  Avoiding ARB due to renal isufficiency.  Jettie Booze., MD  07/26/2011  12:59 PM

## 2011-07-26 NOTE — Progress Notes (Signed)
Pt complaining of 4/10 chest pressure while lying in bed. Relieved by 2 SL nitro, put on 2 liters O2 and EKG obtained. Dr. Cyndia Bent at bedside. Told to call Dr. Lawson Fiscal tonight if pt has recurring chest pain. Will continue to monitor. Lillia Mountain, RN

## 2011-07-26 NOTE — Progress Notes (Signed)
Patient ID: [name], female   DOB: 18-Apr-1941, 71 y.o.   MRN: [id number]   Filed Vitals:   07/26/11 1131 07/26/11 1135 07/26/11 1400 07/26/11 2100  BP: 185/79 185/79 155/74 190/70  Pulse: 82  77 97  Temp:   99.3 F (37.4 C) 99.6 F (37.6 C)  TempSrc:   Oral   Resp:   18 18  Height:      Weight:      SpO2:   94% 94%   Having some chest pressure this pm for the first time since admission.  It is easing off with SL NTG.    I am planning to do CABG Monday.  Dr. Prescott Gum is on call this weekend if she continues to have problems.  She is not on heparin or ntg drip at this time.

## 2011-07-27 ENCOUNTER — Inpatient Hospital Stay (HOSPITAL_COMMUNITY): Payer: Medicare Other

## 2011-07-27 LAB — GLUCOSE, CAPILLARY
Glucose-Capillary: 254 mg/dL — ABNORMAL HIGH (ref 70–99)
Glucose-Capillary: 327 mg/dL — ABNORMAL HIGH (ref 70–99)
Glucose-Capillary: 323 mg/dL — ABNORMAL HIGH (ref 70–99)
Glucose-Capillary: 250 mg/dL — ABNORMAL HIGH (ref 70–99)

## 2011-07-27 NOTE — Progress Notes (Signed)
SUBJECTIVE:  Feels ok and denies chest pain.  OBJECTIVE:   Vitals:   Filed Vitals:   07/26/11 2130 07/26/11 2140 07/27/11 0520 07/27/11 0550  BP: 185/82 164/73 179/78 186/75  Pulse:   96   Temp:   99.8 F (37.7 C)   TempSrc:      Resp:   20   Height:      Weight:      SpO2:   94%    I&O's:   Intake/Output Summary (Last 24 hours) at 07/27/11 0933 Last data filed at 07/26/11 1300  Gross per 24 hour  Intake    240 ml  Output      0 ml  Net    240 ml   TELEMETRY: Reviewed telemetry pt in NSR     PHYSICAL EXAM General: Well developed, well nourished, in no acute distress Head: Eyes PERRLA, No xanthomas.   Normal cephalic and atramatic  Lungs:   Clear bilaterally to auscultation and percussion. Heart:   HRRR S1 S2 Pulses are 2+ & equal.            No carotid bruit. No JVD.  No abdominal bruits. No femoral bruits. Abdomen: Bowel sounds are positive, abdomen soft and non-tender without masses Extremities:   No clubbing, cyanosis or edema.  DP +1 Neuro: Alert and oriented X 3. Psych:  Good affect, responds appropriately   LABS: Basic Metabolic Panel:  Basename 07/26/11 0945 07/25/11 0655  NA 139 140  K 3.8 3.9  CL 101 103  CO2 24 22  GLUCOSE 279* 149*  BUN 16 23  CREATININE 1.46* 1.55*  CALCIUM 8.8 8.3*  MG -- --  PHOS -- --   CBC:  Basename 07/26/11 0945 07/25/11 0655  WBC 7.2 7.2  NEUTROABS -- --  HGB 11.2* 10.6*  HCT 33.9* 32.3*  MCV 92.4 91.2  PLT 163 163   Hemoglobin A1C:  Basename 07/25/11 0655  HGBA1C 6.4*   Coag Panel:   Lab Results  Component Value Date   INR 1.0 04/20/2008    RADIOLOGY: Dg Chest Port 1 View  07/27/2011  *RADIOLOGY REPORT*  Clinical Data: Preop  PORTABLE CHEST - 1 VIEW  Comparison: 02/07/2009  Findings: Mild cardiomegaly.  Pulmonary vascularity is within normal limits.  No consolidation or lung mass.  No pleural effusion.  No interstitial edema.  IMPRESSION: Mild cardiomegaly without decompensation.  Original Report  Authenticated By: Jamas Lav, M.D.      ASSESSMENT:  1.  2 vessel obstructive ASCAD 2.  Anemia with no bleeding source found on Colonoscopy/EGD 3.  HTN - amlodipine and Imdur restarted yesterday 4.  Renal insufficiency 5.  DM 6.  dyslipidemia  PLAN:   1.  Plan CABG on Monday 1/14 by Dr. Cyndia Bent 2.  Adjust BP meds as needed 3.  Continue ASA/nitrates/beta blocker and statin  Sueanne Margarita, MD  07/27/2011  9:33 AM

## 2011-07-28 LAB — BASIC METABOLIC PANEL
BUN: 16 mg/dL (ref 6–23)
CO2: 24 mEq/L (ref 19–32)
Chloride: 98 mEq/L (ref 96–112)
GFR calc non Af Amer: 36 mL/min — ABNORMAL LOW (ref >90)
Glucose, Bld: 267 mg/dL — ABNORMAL HIGH (ref 70–99)
Potassium: 3.7 mEq/L (ref 3.5–5.1)
Sodium: 138 mEq/L (ref 135–145)

## 2011-07-28 LAB — GLUCOSE, CAPILLARY: Glucose-Capillary: 258 mg/dL — ABNORMAL HIGH (ref 70–99)

## 2011-07-28 LAB — URINALYSIS, ROUTINE W REFLEX MICROSCOPIC
Bilirubin Urine: NEGATIVE
Nitrite: NEGATIVE
Specific Gravity, Urine: 1.012 (ref 1.005–1.030)
Urobilinogen, UA: 0.2 mg/dL (ref 0.0–1.0)

## 2011-07-28 LAB — CBC
HCT: 33.5 % — ABNORMAL LOW (ref 36.0–46.0)
Hemoglobin: 11.2 g/dL — ABNORMAL LOW (ref 12.0–15.0)
MCHC: 33.4 g/dL (ref 30.0–36.0)
RBC: 3.66 MIL/uL — ABNORMAL LOW (ref 3.87–5.11)
WBC: 7.4 10*3/uL (ref 4.0–10.5)

## 2011-07-28 LAB — TYPE AND SCREEN
ABO/RH(D): O POS
Unit division: 0
Unit division: 0

## 2011-07-28 LAB — URINE MICROSCOPIC-ADD ON

## 2011-07-28 MED ORDER — CHLORHEXIDINE GLUCONATE 4 % EX LIQD
60.0000 mL | Freq: Once | CUTANEOUS | Status: AC
  Administered 2011-07-28: 4 via TOPICAL
  Filled 2011-07-28: qty 60

## 2011-07-28 MED ORDER — PLASMA-LYTE 148 IV SOLN
INTRAVENOUS | Status: DC
  Filled 2011-07-28 (×2): qty 0.5

## 2011-07-28 MED ORDER — NITROGLYCERIN IN D5W 200-5 MCG/ML-% IV SOLN
2.0000 ug/min | INTRAVENOUS | Status: DC
  Filled 2011-07-28: qty 250

## 2011-07-28 MED ORDER — DEXTROSE 5 % IV SOLN
750.0000 mg | INTRAVENOUS | Status: DC
  Filled 2011-07-28: qty 750

## 2011-07-28 MED ORDER — EPINEPHRINE HCL 1 MG/ML IJ SOLN
0.5000 ug/min | INTRAVENOUS | Status: DC
  Filled 2011-07-28: qty 4

## 2011-07-28 MED ORDER — METOPROLOL TARTRATE 12.5 MG HALF TABLET
12.5000 mg | ORAL_TABLET | Freq: Once | ORAL | Status: AC
  Administered 2011-07-29: 12.5 mg via ORAL
  Filled 2011-07-28: qty 1

## 2011-07-28 MED ORDER — POTASSIUM CHLORIDE 2 MEQ/ML IV SOLN
80.0000 meq | INTRAVENOUS | Status: DC
  Filled 2011-07-28: qty 40

## 2011-07-28 MED ORDER — VANCOMYCIN HCL 1000 MG IV SOLR: 1250.0000 mg | INTRAVENOUS | Status: DC

## 2011-07-28 MED ORDER — TEMAZEPAM 15 MG PO CAPS: 15.0000 mg | ORAL_CAPSULE | Freq: Once | ORAL | Status: DC | PRN

## 2011-07-28 MED ORDER — SODIUM CHLORIDE 0.9 % IV SOLN
INTRAVENOUS | Status: DC
  Filled 2011-07-28: qty 1

## 2011-07-28 MED ORDER — DEXMEDETOMIDINE HCL 100 MCG/ML IV SOLN
0.1000 ug/kg/h | INTRAVENOUS | Status: DC
  Administered 2011-07-29: 0.7 ug/kg/h via INTRAVENOUS
  Filled 2011-07-28: qty 4

## 2011-07-28 MED ORDER — METOPROLOL TARTRATE 50 MG PO TABS
50.0000 mg | ORAL_TABLET | Freq: Two times a day (BID) | ORAL | Status: DC
Start: 2011-07-28 — End: 2011-07-29
  Administered 2011-07-28 – 2011-07-29 (×3): 50 mg via ORAL
  Filled 2011-07-28 (×3): qty 1

## 2011-07-28 MED ORDER — CHLORHEXIDINE GLUCONATE 4 % EX LIQD: 60.0000 mL | Freq: Once | CUTANEOUS | Status: DC

## 2011-07-28 MED ORDER — CHLORHEXIDINE GLUCONATE 4 % EX LIQD
60.0000 mL | Freq: Once | CUTANEOUS | Status: AC
  Administered 2011-07-29: 4 via TOPICAL
  Filled 2011-07-28: qty 15

## 2011-07-28 MED ORDER — PHENYLEPHRINE HCL 10 MG/ML IJ SOLN
30.0000 ug/min | INTRAVENOUS | Status: DC
  Filled 2011-07-28: qty 2

## 2011-07-28 MED ORDER — MAGNESIUM SULFATE 50 % IJ SOLN
40.0000 meq | INTRAMUSCULAR | Status: DC
  Filled 2011-07-28: qty 10

## 2011-07-28 MED ORDER — SODIUM CHLORIDE 0.9 % IV SOLN
INTRAVENOUS | Status: DC
  Filled 2011-07-28: qty 40

## 2011-07-28 MED ORDER — DEXTROSE 5 % IV SOLN
1.5000 g | INTRAVENOUS | Status: AC
  Administered 2011-07-29: 1.5 g via INTRAVENOUS
  Filled 2011-07-28: qty 1.5

## 2011-07-28 MED ORDER — DOPAMINE-DEXTROSE 3.2-5 MG/ML-% IV SOLN
2.0000 ug/kg/min | INTRAVENOUS | Status: DC
  Filled 2011-07-28: qty 250

## 2011-07-28 MED ORDER — BISACODYL 5 MG PO TBEC
5.0000 mg | DELAYED_RELEASE_TABLET | Freq: Once | ORAL | Status: AC
  Administered 2011-07-28: 5 mg via ORAL
  Filled 2011-07-28: qty 1

## 2011-07-28 MED ORDER — VANCOMYCIN HCL 1000 MG IV SOLR
1500.0000 mg | INTRAVENOUS | Status: AC
  Administered 2011-07-29: 1500 mg via INTRAVENOUS
  Filled 2011-07-28 (×2): qty 1500

## 2011-07-28 NOTE — Progress Notes (Signed)
SUBJECTIVE:  No CP or SOB OBJECTIVE:   Vitals:   Filed Vitals:   07/27/11 0550 07/27/11 1400 07/27/11 2100 07/28/11 0600  BP: 186/75 157/69 146/65 182/81  Pulse:  82 88 100  Temp:  100.9 F (38.3 C) 98.3 F (36.8 C) 98.8 F (37.1 C)  TempSrc:  Oral    Resp:  18 20 18   Height:      Weight:      SpO2:  95% 95% 94%   I&O's:  No intake or output data in the 24 hours ending 07/28/11 1014 TELEMETRY: Reviewed telemetry pt in NSR     PHYSICAL EXAM General: Well developed, well nourished, in no acute distress Head: Eyes PERRLA, No xanthomas.   Normal cephalic and atramatic  Lungs:   Clear bilaterally to auscultation and percussion. Heart:   HRRR S1 S2 Pulses are 2+ & equal.            No carotid bruit. No JVD.  No abdominal bruits. No femoral bruits. Abdomen: Bowel sounds are positive, abdomen soft and non-tender without masses  Extremities:   No clubbing, cyanosis or edema.  DP +1 Neuro: Alert and oriented X 3. Psych:  Good affect, responds appropriately   LABS: Basic Metabolic Panel:  Basename 07/28/11 0807 07/26/11 0945  NA 138 139  K 3.7 3.8  CL 98 101  CO2 24 24  GLUCOSE 267* 279*  BUN 16 16  CREATININE 1.42* 1.46*  CALCIUM 9.2 8.8  MG -- --  PHOS -- --   CBC:  Basename 07/28/11 0807 07/26/11 0945  WBC 7.4 7.2  NEUTROABS -- --  HGB 11.2* 11.2*  HCT 33.5* 33.9*  MCV 91.5 92.4  PLT 180 163   Coag Panel:   Lab Results  Component Value Date   INR 1.0 04/20/2008    RADIOLOGY: Dg Chest Port 1 View  07/27/2011  *RADIOLOGY REPORT*  Clinical Data: Preop  PORTABLE CHEST - 1 VIEW  Comparison: 02/07/2009  Findings: Mild cardiomegaly.  Pulmonary vascularity is within normal limits.  No consolidation or lung mass.  No pleural effusion.  No interstitial edema.  IMPRESSION: Mild cardiomegaly without decompensation.  Original Report Authenticated By: Jamas Lav, M.D.      ASSESSMENT:   1. 2 vessel obstructive ASCAD  2. Anemia with no bleeding source found on  Colonoscopy/EGD  3. HTN - amlodipine and Imdur restarted yesterday  4. Renal insufficiency  5. DM  6. dyslipidemia  PLAN:   1. Plan CABG on Monday 1/14 by Dr. Cyndia Bent  2. Increase Metoprolol to 50mg  BID for better BP control 3. Continue ASA/nitrates/beta blocker and statin  Sueanne Margarita, MD  07/28/2011  10:14 AM

## 2011-07-29 ENCOUNTER — Encounter (HOSPITAL_COMMUNITY): Payer: Self-pay | Admitting: Anesthesiology

## 2011-07-29 ENCOUNTER — Inpatient Hospital Stay (HOSPITAL_COMMUNITY): Payer: Medicare Other

## 2011-07-29 ENCOUNTER — Encounter (HOSPITAL_COMMUNITY)
Admission: AD | Disposition: A | Discharge status: Home / Self Care | Payer: Self-pay | Source: Ambulatory Visit | Attending: Interventional Cardiology

## 2011-07-29 ENCOUNTER — Inpatient Hospital Stay (HOSPITAL_COMMUNITY): Payer: Medicare Other | Admitting: Anesthesiology

## 2011-07-29 DIAGNOSIS — I2510 Atherosclerotic heart disease of native coronary artery without angina pectoris: Secondary | ICD-10-CM

## 2011-07-29 HISTORY — PX: CORONARY ARTERY BYPASS GRAFT: SHX141

## 2011-07-29 LAB — BLOOD GAS, ARTERIAL
Bicarbonate: 25.5 mEq/L — ABNORMAL HIGH (ref 20.0–24.0)
FIO2: 0.21 %
O2 Saturation: 94.9 %
pO2, Arterial: 69.6 mmHg — ABNORMAL LOW (ref 80.0–100.0)

## 2011-07-29 LAB — POCT I-STAT 3, ART BLOOD GAS (G3+)
Acid-Base Excess: 2 mmol/L (ref 0.0–2.0)
Acid-base deficit: 1 mmol/L (ref 0.0–2.0)
Acid-base deficit: 2 mmol/L (ref 0.0–2.0)
Acid-base deficit: 3 mmol/L — ABNORMAL HIGH (ref 0.0–2.0)
Acid-base deficit: 3 mmol/L — ABNORMAL HIGH (ref 0.0–2.0)
Bicarbonate: 23.3 mEq/L (ref 20.0–24.0)
Bicarbonate: 25 mEq/L — ABNORMAL HIGH (ref 20.0–24.0)
Bicarbonate: 21.7 mEq/L (ref 20.0–24.0)
Bicarbonate: 23.7 mEq/L (ref 20.0–24.0)
O2 Saturation: 100 %
O2 Saturation: 100 %
O2 Saturation: 100 %
O2 Saturation: 95 %
Patient temperature: 38.9
Patient temperature: 38
TCO2: 24 mmol/L (ref 0–100)
TCO2: 26 mmol/L (ref 0–100)
TCO2: 23 mmol/L (ref 0–100)
TCO2: 25 mmol/L (ref 0–100)
pCO2 arterial: 31.9 mmHg — ABNORMAL LOW (ref 35.0–45.0)
pCO2 arterial: 37.7 mmHg (ref 35.0–45.0)
pCO2 arterial: 48.4 mmHg — ABNORMAL HIGH (ref 35.0–45.0)
pH, Arterial: 7.446 — ABNORMAL HIGH (ref 7.350–7.400)
pH, Arterial: 7.504 — ABNORMAL HIGH (ref 7.350–7.400)
pH, Arterial: 7.287 — ABNORMAL LOW (ref 7.350–7.400)
pO2, Arterial: 298 mmHg — ABNORMAL HIGH (ref 80.0–100.0)
pO2, Arterial: 325 mmHg — ABNORMAL HIGH (ref 80.0–100.0)

## 2011-07-29 LAB — CBC
Hemoglobin: 10.5 g/dL — ABNORMAL LOW (ref 12.0–15.0)
MCHC: 34.7 g/dL (ref 30.0–36.0)
Platelets: 128 10*3/uL — ABNORMAL LOW (ref 150–400)
RBC: 3.46 MIL/uL — ABNORMAL LOW (ref 3.87–5.11)

## 2011-07-29 LAB — POCT I-STAT 4, (NA,K, GLUC, HGB,HCT)
Glucose, Bld: 181 mg/dL — ABNORMAL HIGH (ref 70–99)
Glucose, Bld: 141 mg/dL — ABNORMAL HIGH (ref 70–99)
HCT: 32 % — ABNORMAL LOW (ref 36.0–46.0)
HCT: 23 % — ABNORMAL LOW (ref 36.0–46.0)
HCT: 26 % — ABNORMAL LOW (ref 36.0–46.0)
HCT: 40 % (ref 36.0–46.0)
Hemoglobin: 10.9 g/dL — ABNORMAL LOW (ref 12.0–15.0)
Hemoglobin: 7.8 g/dL — ABNORMAL LOW (ref 12.0–15.0)
Hemoglobin: 9.2 g/dL — ABNORMAL LOW (ref 12.0–15.0)
Hemoglobin: 8.8 g/dL — ABNORMAL LOW (ref 12.0–15.0)
Potassium: 3.7 mEq/L (ref 3.5–5.1)
Potassium: 4.1 mEq/L (ref 3.5–5.1)
Sodium: 133 mEq/L — ABNORMAL LOW (ref 135–145)
Sodium: 136 mEq/L (ref 135–145)
Sodium: 137 mEq/L (ref 135–145)

## 2011-07-29 LAB — POCT I-STAT, CHEM 8
Calcium, Ion: 1.18 mmol/L (ref 1.12–1.32)
Glucose, Bld: 114 mg/dL — ABNORMAL HIGH (ref 70–99)
HCT: 32 % — ABNORMAL LOW (ref 36.0–46.0)
Hemoglobin: 10.9 g/dL — ABNORMAL LOW (ref 12.0–15.0)
Potassium: 4.4 mEq/L (ref 3.5–5.1)

## 2011-07-29 LAB — GLUCOSE, CAPILLARY: Glucose-Capillary: 194 mg/dL — ABNORMAL HIGH (ref 70–99)

## 2011-07-29 LAB — PREPARE PLATELET PHERESIS: Unit division: 0

## 2011-07-29 LAB — APTT
aPTT: 37 seconds (ref 24–37)
aPTT: 49 seconds — ABNORMAL HIGH (ref 24–37)

## 2011-07-29 LAB — PROTIME-INR
INR: 1.13 (ref 0.00–1.49)
INR: 1.44 (ref 0.00–1.49)
Prothrombin Time: 14.7 seconds (ref 11.6–15.2)

## 2011-07-29 LAB — HEMOGLOBIN AND HEMATOCRIT, BLOOD: HCT: 32.3 % — ABNORMAL LOW (ref 36.0–46.0)

## 2011-07-29 SURGERY — CORONARY ARTERY BYPASS GRAFTING (CABG)
Anesthesia: General | Site: Chest | Wound class: Clean

## 2011-07-29 MED ORDER — ACETAMINOPHEN 160 MG/5ML PO SOLN: 650.0000 mg | ORAL | Status: AC

## 2011-07-29 MED ORDER — SODIUM CHLORIDE 0.45 % IV SOLN
INTRAVENOUS | Status: DC
  Administered 2011-07-31: 04:00:00 via INTRAVENOUS

## 2011-07-29 MED ORDER — SODIUM CHLORIDE 0.9 % IV SOLN
INTRAVENOUS | Status: DC
  Administered 2011-07-29: 18:00:00 via INTRAVENOUS

## 2011-07-29 MED ORDER — PHENYLEPHRINE HCL 10 MG/ML IJ SOLN
20.0000 mg | INTRAVENOUS | Status: DC | PRN
  Administered 2011-07-29: 26.67 ug/min via INTRAVENOUS

## 2011-07-29 MED ORDER — DEXTROSE 5 % IV SOLN
0.7500 g | INTRAVENOUS | Status: DC | PRN
  Administered 2011-07-29: .75 g via INTRAVENOUS

## 2011-07-29 MED ORDER — SODIUM CHLORIDE 0.9 % IV SOLN
100.0000 [IU] | INTRAVENOUS | Status: DC | PRN
  Administered 2011-07-29: 5 [IU]/h via INTRAVENOUS

## 2011-07-29 MED ORDER — METOPROLOL TARTRATE 12.5 MG HALF TABLET
12.5000 mg | ORAL_TABLET | Freq: Two times a day (BID) | ORAL | Status: DC
  Administered 2011-07-30 – 2011-07-31 (×4): 12.5 mg via ORAL
  Filled 2011-07-29 (×7): qty 1

## 2011-07-29 MED ORDER — METOPROLOL TARTRATE 25 MG/10 ML ORAL SUSPENSION
12.5000 mg | Freq: Two times a day (BID) | ORAL | Status: DC
  Filled 2011-07-29 (×7): qty 5

## 2011-07-29 MED ORDER — VANCOMYCIN HCL 1000 MG IV SOLR
1000.0000 mg | Freq: Once | INTRAVENOUS | Status: AC
  Administered 2011-07-30: 1000 mg via INTRAVENOUS
  Filled 2011-07-29: qty 1000

## 2011-07-29 MED ORDER — ACETAMINOPHEN 160 MG/5ML PO SOLN
975.0000 mg | Freq: Four times a day (QID) | ORAL | Status: DC
  Filled 2011-07-29: qty 40.6

## 2011-07-29 MED ORDER — NITROGLYCERIN IN D5W 200-5 MCG/ML-% IV SOLN: 0.0000 ug/min | INTRAVENOUS | Status: DC

## 2011-07-29 MED ORDER — OXYCODONE HCL 5 MG PO TABS
5.0000 mg | ORAL_TABLET | ORAL | Status: DC | PRN
  Administered 2011-07-31: 5 mg via ORAL
  Filled 2011-07-29: qty 1

## 2011-07-29 MED ORDER — ALBUMIN HUMAN 5 % IV SOLN
250.0000 mL | INTRAVENOUS | Status: AC | PRN
  Administered 2011-07-29: 250 mL via INTRAVENOUS

## 2011-07-29 MED ORDER — ALBUMIN HUMAN 5 % IV SOLN
INTRAVENOUS | Status: DC | PRN
  Administered 2011-07-29: 17:00:00 via INTRAVENOUS

## 2011-07-29 MED ORDER — SUFENTANIL CITRATE 50 MCG/ML IV SOLN
INTRAVENOUS | Status: DC | PRN
  Administered 2011-07-29: 20 ug via INTRAVENOUS
  Administered 2011-07-29: 45 ug via INTRAVENOUS
  Administered 2011-07-29: 50 ug via INTRAVENOUS
  Administered 2011-07-29: 30 ug via INTRAVENOUS
  Administered 2011-07-29: 5 ug via INTRAVENOUS
  Administered 2011-07-29 (×5): 10 ug via INTRAVENOUS

## 2011-07-29 MED ORDER — LACTATED RINGERS IV SOLN
INTRAVENOUS | Status: DC | PRN
  Administered 2011-07-29: 13:00:00 via INTRAVENOUS

## 2011-07-29 MED ORDER — PROPOFOL 10 MG/ML IV EMUL
INTRAVENOUS | Status: DC | PRN
  Administered 2011-07-29: 60 mg via INTRAVENOUS
  Administered 2011-07-29: 40 mg via INTRAVENOUS

## 2011-07-29 MED ORDER — ROCURONIUM BROMIDE 100 MG/10ML IV SOLN
INTRAVENOUS | Status: DC | PRN
  Administered 2011-07-29: 20 mg via INTRAVENOUS
  Administered 2011-07-29: 50 mg via INTRAVENOUS
  Administered 2011-07-29: 30 mg via INTRAVENOUS

## 2011-07-29 MED ORDER — CITALOPRAM HYDROBROMIDE 20 MG PO TABS
20.0000 mg | ORAL_TABLET | Freq: Every day | ORAL | Status: DC
  Administered 2011-07-30 – 2011-08-03 (×5): 20 mg via ORAL
  Filled 2011-07-29 (×6): qty 1

## 2011-07-29 MED ORDER — COLCHICINE 0.6 MG PO TABS
0.6000 mg | ORAL_TABLET | Freq: Two times a day (BID) | ORAL | Status: DC
  Administered 2011-07-30 (×2): 0.6 mg via ORAL
  Filled 2011-07-29 (×4): qty 1

## 2011-07-29 MED ORDER — VERAPAMIL HCL 2.5 MG/ML IV SOLN
INTRAVENOUS | Status: DC
  Filled 2011-07-29: qty 0.5

## 2011-07-29 MED ORDER — SODIUM CHLORIDE 0.9 % IV SOLN
10.0000 g | INTRAVENOUS | Status: DC | PRN
  Administered 2011-07-29: 5 g/h via INTRAVENOUS

## 2011-07-29 MED ORDER — NITROGLYCERIN IN D5W 200-5 MCG/ML-% IV SOLN
INTRAVENOUS | Status: DC | PRN
  Administered 2011-07-29: 16.6 ug/min via INTRAVENOUS

## 2011-07-29 MED ORDER — SODIUM CHLORIDE 0.9 % IJ SOLN
3.0000 mL | Freq: Two times a day (BID) | INTRAMUSCULAR | Status: DC
  Administered 2011-07-30 (×2): 3 mL via INTRAVENOUS

## 2011-07-29 MED ORDER — BISACODYL 5 MG PO TBEC
10.0000 mg | DELAYED_RELEASE_TABLET | Freq: Every day | ORAL | Status: DC
  Administered 2011-07-30 – 2011-08-03 (×3): 10 mg via ORAL
  Filled 2011-07-29 (×3): qty 2

## 2011-07-29 MED ORDER — VERAPAMIL HCL 2.5 MG/ML IV SOLN
INTRAVENOUS | Status: DC | PRN
  Administered 2011-07-29: 15:00:00

## 2011-07-29 MED ORDER — THROMBIN 20000 UNITS EX KIT
PACK | OROMUCOSAL | Status: DC | PRN
  Administered 2011-07-29: 15:00:00 via TOPICAL

## 2011-07-29 MED ORDER — LACTATED RINGERS IV SOLN
INTRAVENOUS | Status: DC | PRN
  Administered 2011-07-29 (×2): via INTRAVENOUS

## 2011-07-29 MED ORDER — ASPIRIN 81 MG PO CHEW: 324.0000 mg | CHEWABLE_TABLET | Freq: Every day | ORAL | Status: DC

## 2011-07-29 MED ORDER — PROTAMINE SULFATE 10 MG/ML IV SOLN
INTRAVENOUS | Status: DC | PRN
  Administered 2011-07-29: 290 mg via INTRAVENOUS

## 2011-07-29 MED ORDER — LACTATED RINGERS IV SOLN
INTRAVENOUS | Status: DC
  Administered 2011-07-29: 18:00:00 via INTRAVENOUS

## 2011-07-29 MED ORDER — SODIUM CHLORIDE 0.9 % IV SOLN
INTRAVENOUS | Status: DC | PRN
  Administered 2011-07-29: 17:00:00 via INTRAVENOUS

## 2011-07-29 MED ORDER — MIDAZOLAM HCL 2 MG/2ML IJ SOLN: 2.0000 mg | INTRAMUSCULAR | Status: DC | PRN

## 2011-07-29 MED ORDER — MAGNESIUM SULFATE 40 MG/ML IJ SOLN
4.0000 g | Freq: Once | INTRAMUSCULAR | Status: AC
  Administered 2011-07-29: 4 g via INTRAVENOUS
  Filled 2011-07-29: qty 100

## 2011-07-29 MED ORDER — TIMOLOL MALEATE 0.5 % OP SOLN
1.0000 [drp] | Freq: Every day | OPHTHALMIC | Status: DC
  Administered 2011-07-29 – 2011-08-03 (×6): 1 [drp] via OPHTHALMIC
  Filled 2011-07-29: qty 5

## 2011-07-29 MED ORDER — ACETAMINOPHEN 500 MG PO TABS
1000.0000 mg | ORAL_TABLET | Freq: Four times a day (QID) | ORAL | Status: DC
  Administered 2011-07-30 – 2011-07-31 (×5): 1000 mg via ORAL
  Filled 2011-07-29 (×9): qty 2

## 2011-07-29 MED ORDER — POTASSIUM CHLORIDE 10 MEQ/50ML IV SOLN
10.0000 meq | INTRAVENOUS | Status: AC
  Administered 2011-07-29 (×3): 10 meq via INTRAVENOUS

## 2011-07-29 MED ORDER — HEMOSTATIC AGENTS (NO CHARGE) OPTIME
TOPICAL | Status: DC | PRN
  Administered 2011-07-29: 1 via TOPICAL

## 2011-07-29 MED ORDER — DEXTROSE 5 % IV SOLN
1.5000 g | Freq: Two times a day (BID) | INTRAVENOUS | Status: DC
  Administered 2011-07-29 – 2011-07-31 (×4): 1.5 g via INTRAVENOUS
  Filled 2011-07-29 (×5): qty 1.5

## 2011-07-29 MED ORDER — LACTATED RINGERS IV SOLN: 500.0000 mL | Freq: Once | INTRAVENOUS | Status: AC | PRN

## 2011-07-29 MED ORDER — ONDANSETRON HCL 4 MG/2ML IJ SOLN: 4.0000 mg | Freq: Four times a day (QID) | INTRAMUSCULAR | Status: DC | PRN

## 2011-07-29 MED ORDER — SODIUM CHLORIDE 0.9 % IV SOLN: 250.0000 mL | INTRAVENOUS | Status: DC

## 2011-07-29 MED ORDER — TRAVOPROST 0.004 % OP SOLN
1.0000 [drp] | Freq: Every day | OPHTHALMIC | Status: DC
  Administered 2011-07-29 – 2011-08-02 (×5): 1 [drp] via OPHTHALMIC
  Filled 2011-07-29 (×4): qty 0.1

## 2011-07-29 MED ORDER — MIDAZOLAM HCL 2 MG/2ML IJ SOLN: 1.0000 mg | INTRAMUSCULAR | Status: DC | PRN

## 2011-07-29 MED ORDER — INSULIN REGULAR BOLUS VIA INFUSION
0.0000 [IU] | Freq: Three times a day (TID) | INTRAVENOUS | Status: DC
  Filled 2011-07-29 (×4): qty 10

## 2011-07-29 MED ORDER — BISACODYL 10 MG RE SUPP: 10.0000 mg | Freq: Every day | RECTAL | Status: DC

## 2011-07-29 MED ORDER — ASPIRIN EC 325 MG PO TBEC
325.0000 mg | DELAYED_RELEASE_TABLET | Freq: Every day | ORAL | Status: DC
  Administered 2011-07-30 – 2011-08-03 (×5): 325 mg via ORAL
  Filled 2011-07-29 (×5): qty 1

## 2011-07-29 MED ORDER — FENTANYL CITRATE 0.05 MG/ML IJ SOLN: 50.0000 ug | INTRAMUSCULAR | Status: DC | PRN

## 2011-07-29 MED ORDER — PHENYLEPHRINE HCL 10 MG/ML IJ SOLN
0.0000 ug/min | INTRAVENOUS | Status: DC
  Filled 2011-07-29: qty 2

## 2011-07-29 MED ORDER — METOPROLOL TARTRATE 1 MG/ML IV SOLN: 2.5000 mg | INTRAVENOUS | Status: DC | PRN

## 2011-07-29 MED ORDER — MIDAZOLAM HCL 5 MG/5ML IJ SOLN
INTRAMUSCULAR | Status: DC | PRN
  Administered 2011-07-29 (×3): 2 mg via INTRAVENOUS
  Administered 2011-07-29: 4 mg via INTRAVENOUS

## 2011-07-29 MED ORDER — 0.9 % SODIUM CHLORIDE (POUR BTL) OPTIME
TOPICAL | Status: DC | PRN
  Administered 2011-07-29: 5000 mL

## 2011-07-29 MED ORDER — BRIMONIDINE TARTRATE 0.2 % OP SOLN
1.0000 [drp] | Freq: Two times a day (BID) | OPHTHALMIC | Status: DC
  Administered 2011-07-29 – 2011-08-03 (×10): 1 [drp] via OPHTHALMIC
  Filled 2011-07-29: qty 5

## 2011-07-29 MED ORDER — MORPHINE SULFATE 4 MG/ML IJ SOLN
2.0000 mg | INTRAMUSCULAR | Status: DC | PRN
  Administered 2011-07-30: 4 mg via INTRAVENOUS
  Filled 2011-07-29 (×2): qty 1

## 2011-07-29 MED ORDER — ACETAMINOPHEN 650 MG RE SUPP
650.0000 mg | RECTAL | Status: AC
  Administered 2011-07-29: 650 mg via RECTAL

## 2011-07-29 MED ORDER — FAMOTIDINE IN NACL 20-0.9 MG/50ML-% IV SOLN
20.0000 mg | Freq: Two times a day (BID) | INTRAVENOUS | Status: DC
  Administered 2011-07-30 (×2): 20 mg via INTRAVENOUS
  Filled 2011-07-29: qty 50

## 2011-07-29 MED ORDER — MORPHINE SULFATE 2 MG/ML IJ SOLN: 1.0000 mg | INTRAMUSCULAR | Status: AC | PRN

## 2011-07-29 MED ORDER — SODIUM CHLORIDE 0.9 % IV SOLN
200.0000 ug | INTRAVENOUS | Status: DC | PRN
  Administered 2011-07-29 (×2): 0.2 ug/kg/h via INTRAVENOUS

## 2011-07-29 MED ORDER — DOCUSATE SODIUM 100 MG PO CAPS
200.0000 mg | ORAL_CAPSULE | Freq: Every day | ORAL | Status: DC
  Administered 2011-07-30 – 2011-08-03 (×4): 200 mg via ORAL
  Filled 2011-07-29 (×5): qty 2

## 2011-07-29 MED ORDER — SODIUM CHLORIDE 0.9 % IV SOLN
INTRAVENOUS | Status: DC
  Filled 2011-07-29 (×2): qty 1

## 2011-07-29 MED ORDER — SODIUM CHLORIDE 0.9 % IJ SOLN: 3.0000 mL | INTRAMUSCULAR | Status: DC | PRN

## 2011-07-29 MED ORDER — PANTOPRAZOLE SODIUM 40 MG PO TBEC
40.0000 mg | DELAYED_RELEASE_TABLET | Freq: Every day | ORAL | Status: DC
  Administered 2011-07-31 – 2011-08-02 (×3): 40 mg via ORAL
  Filled 2011-07-29 (×3): qty 1

## 2011-07-29 MED ORDER — BRIMONIDINE TARTRATE 0.1 % OP SOLN: 1.0000 [drp] | Freq: Two times a day (BID) | OPHTHALMIC | Status: DC

## 2011-07-29 MED ORDER — SODIUM CHLORIDE 0.9 % IV SOLN: 0.1000 ug/kg/h | INTRAVENOUS | Status: DC

## 2011-07-29 MED ORDER — LACTATED RINGERS IV SOLN: INTRAVENOUS | Status: DC

## 2011-07-29 MED ORDER — HEPARIN SODIUM (PORCINE) 1000 UNIT/ML IJ SOLN
INTRAMUSCULAR | Status: DC | PRN
  Administered 2011-07-29: 39000 [IU] via INTRAVENOUS
  Administered 2011-07-29: 10000 [IU] via INTRAVENOUS

## 2011-07-29 MED ORDER — FAMOTIDINE IN NACL 20-0.9 MG/50ML-% IV SOLN
20.0000 mg | Freq: Two times a day (BID) | INTRAVENOUS | Status: DC
  Administered 2011-07-29: 20 mg via INTRAVENOUS

## 2011-07-29 MED ORDER — ALLOPURINOL 100 MG PO TABS
100.0000 mg | ORAL_TABLET | Freq: Every day | ORAL | Status: DC
  Administered 2011-07-30 – 2011-08-03 (×5): 100 mg via ORAL
  Filled 2011-07-29 (×5): qty 1

## 2011-07-29 SURGICAL SUPPLY — 109 items
ADAPTER CARDIO PERF ANTE/RETRO (ADAPTER) IMPLANT
ATTRACTOMAT 16X20 MAGNETIC DRP (DRAPES) ×2 IMPLANT
BAG DECANTER FOR FLEXI CONT (MISCELLANEOUS) ×2 IMPLANT
BANDAGE ELASTIC 4 VELCRO ST LF (GAUZE/BANDAGES/DRESSINGS) ×2 IMPLANT
BANDAGE ELASTIC 6 VELCRO ST LF (GAUZE/BANDAGES/DRESSINGS) ×2 IMPLANT
BANDAGE GAUZE ELAST BULKY 4 IN (GAUZE/BANDAGES/DRESSINGS) ×2 IMPLANT
BASKET HEART (ORDER IN 25'S) (MISCELLANEOUS) ×1
BASKET HEART (ORDER IN 25S) (MISCELLANEOUS) ×1 IMPLANT
BLADE SAW STERNAL (BLADE) ×2 IMPLANT
BLADE SURG ROTATE 9660 (MISCELLANEOUS) IMPLANT
CANISTER SUCTION 2500CC (MISCELLANEOUS) ×2 IMPLANT
CANNULA GUNDRY RCSP 15FR (MISCELLANEOUS) IMPLANT
CATH ROBINSON RED A/P 18FR (CATHETERS) ×4 IMPLANT
CATH THORACIC 28FR (CATHETERS) ×2 IMPLANT
CATH THORACIC 28FR RT ANG (CATHETERS) IMPLANT
CATH THORACIC 36FR (CATHETERS) ×2 IMPLANT
CATH THORACIC 36FR RT ANG (CATHETERS) ×2 IMPLANT
CLIP FOGARTY SPRING 6M (CLIP) IMPLANT
CLIP TI MEDIUM 24 (CLIP) IMPLANT
CLIP TI WIDE RED SMALL 24 (CLIP) IMPLANT
CLOTH BEACON ORANGE TIMEOUT ST (SAFETY) ×2 IMPLANT
COVER SURGICAL LIGHT HANDLE (MISCELLANEOUS) ×4 IMPLANT
CRADLE DONUT ADULT HEAD (MISCELLANEOUS) ×2 IMPLANT
DRAPE CARDIOVASCULAR INCISE (DRAPES) ×1
DRAPE SLUSH MACHINE 52X66 (DRAPES) ×2 IMPLANT
DRAPE SLUSH/WARMER DISC (DRAPES) IMPLANT
DRAPE SRG 135X102X78XABS (DRAPES) ×1 IMPLANT
DRSG COVADERM 4X14 (GAUZE/BANDAGES/DRESSINGS) ×2 IMPLANT
ELECT CAUTERY BLADE 6.4 (BLADE) ×2 IMPLANT
ELECT REM PT RETURN 9FT ADLT (ELECTROSURGICAL) ×4
ELECTRODE REM PT RTRN 9FT ADLT (ELECTROSURGICAL) ×2 IMPLANT
GAUZE SPONGE 4X4 12PLY STRL LF (GAUZE/BANDAGES/DRESSINGS) ×2 IMPLANT
GLOVE BIO SURGEON STRL SZ 6 (GLOVE) IMPLANT
GLOVE BIO SURGEON STRL SZ 6.5 (GLOVE) ×8 IMPLANT
GLOVE BIO SURGEON STRL SZ7 (GLOVE) ×4 IMPLANT
GLOVE BIO SURGEON STRL SZ7.5 (GLOVE) ×2 IMPLANT
GLOVE BIOGEL PI IND STRL 6 (GLOVE) IMPLANT
GLOVE BIOGEL PI IND STRL 6.5 (GLOVE) IMPLANT
GLOVE BIOGEL PI IND STRL 7.0 (GLOVE) IMPLANT
GLOVE BIOGEL PI INDICATOR 6 (GLOVE)
GLOVE BIOGEL PI INDICATOR 6.5 (GLOVE)
GLOVE BIOGEL PI INDICATOR 7.0 (GLOVE)
GLOVE EUDERMIC 7 POWDERFREE (GLOVE) ×4 IMPLANT
GLOVE EXAM NITRILE MD LF STRL (GLOVE) ×2 IMPLANT
GLOVE ORTHO TXT STRL SZ7.5 (GLOVE) IMPLANT
GOWN PREVENTION PLUS XLARGE (GOWN DISPOSABLE) ×4 IMPLANT
GOWN STRL NON-REIN LRG LVL3 (GOWN DISPOSABLE) ×8 IMPLANT
HEMOSTAT POWDER SURGIFOAM 1G (HEMOSTASIS) ×6 IMPLANT
HEMOSTAT SURGICEL 2X14 (HEMOSTASIS) ×2 IMPLANT
INSERT FOGARTY 61MM (MISCELLANEOUS) IMPLANT
INSERT FOGARTY XLG (MISCELLANEOUS) IMPLANT
KIT BASIN OR (CUSTOM PROCEDURE TRAY) ×2 IMPLANT
KIT CATH CPB BARTLE (MISCELLANEOUS) ×2 IMPLANT
KIT ROOM TURNOVER OR (KITS) ×2 IMPLANT
KIT SUCTION CATH 14FR (SUCTIONS) ×2 IMPLANT
KIT VASOVIEW W/TROCAR VH 2000 (KITS) ×2 IMPLANT
NS IRRIG 1000ML POUR BTL (IV SOLUTION) ×12 IMPLANT
PACK OPEN HEART (CUSTOM PROCEDURE TRAY) ×2 IMPLANT
PAD ARMBOARD 7.5X6 YLW CONV (MISCELLANEOUS) ×4 IMPLANT
PENCIL BUTTON HOLSTER BLD 10FT (ELECTRODE) ×2 IMPLANT
PUNCH AORTIC ROTATE 4.0MM (MISCELLANEOUS) IMPLANT
PUNCH AORTIC ROTATE 4.5MM 8IN (MISCELLANEOUS) ×2 IMPLANT
PUNCH AORTIC ROTATE 5MM 8IN (MISCELLANEOUS) IMPLANT
SET CARDIOPLEGIA MPS 5001102 (MISCELLANEOUS) ×2 IMPLANT
SOLUTION ANTI FOG 6CC (MISCELLANEOUS) IMPLANT
SPONGE GAUZE 4X4 12PLY (GAUZE/BANDAGES/DRESSINGS) ×4 IMPLANT
SPONGE INTESTINAL PEANUT (DISPOSABLE) IMPLANT
SPONGE LAP 18X18 X RAY DECT (DISPOSABLE) ×2 IMPLANT
SPONGE LAP 4X18 X RAY DECT (DISPOSABLE) ×2 IMPLANT
SUT BONE WAX W31G (SUTURE) ×2 IMPLANT
SUT MNCRL AB 4-0 PS2 18 (SUTURE) IMPLANT
SUT PROLENE 3 0 SH DA (SUTURE) IMPLANT
SUT PROLENE 3 0 SH1 36 (SUTURE) ×2 IMPLANT
SUT PROLENE 4 0 RB 1 (SUTURE)
SUT PROLENE 4 0 SH DA (SUTURE) IMPLANT
SUT PROLENE 4-0 RB1 .5 CRCL 36 (SUTURE) IMPLANT
SUT PROLENE 5 0 C 1 36 (SUTURE) IMPLANT
SUT PROLENE 6 0 C 1 30 (SUTURE) ×4 IMPLANT
SUT PROLENE 6 0 CC (SUTURE) IMPLANT
SUT PROLENE 7 0 BV 1 (SUTURE) IMPLANT
SUT PROLENE 7 0 BV1 MDA (SUTURE) ×4 IMPLANT
SUT PROLENE 7.0 RB 3 (SUTURE) ×2 IMPLANT
SUT PROLENE 8 0 BV175 6 (SUTURE) IMPLANT
SUT SILK  1 MH (SUTURE)
SUT SILK 1 MH (SUTURE) IMPLANT
SUT SILK 2 0 SH CR/8 (SUTURE) IMPLANT
SUT SILK 3 0 SH CR/8 (SUTURE) IMPLANT
SUT STEEL STERNAL CCS#1 18IN (SUTURE) IMPLANT
SUT STEEL SZ 6 DBL 3X14 BALL (SUTURE) IMPLANT
SUT VIC AB 1 CTX 36 (SUTURE) ×2
SUT VIC AB 1 CTX36XBRD ANBCTR (SUTURE) ×2 IMPLANT
SUT VIC AB 2-0 CT1 27 (SUTURE) ×1
SUT VIC AB 2-0 CT1 TAPERPNT 27 (SUTURE) ×1 IMPLANT
SUT VIC AB 2-0 CTX 27 (SUTURE) IMPLANT
SUT VIC AB 3-0 SH 27 (SUTURE)
SUT VIC AB 3-0 SH 27X BRD (SUTURE) IMPLANT
SUT VIC AB 3-0 X1 27 (SUTURE) ×2 IMPLANT
SUT VICRYL 4-0 PS2 18IN ABS (SUTURE) IMPLANT
SUTURE E-PAK OPEN HEART (SUTURE) ×2 IMPLANT
SYSTEM SAHARA CHEST DRAIN ATS (WOUND CARE) ×2 IMPLANT
TAPE CLOTH SURG 4X10 WHT LF (GAUZE/BANDAGES/DRESSINGS) ×2 IMPLANT
TAPE PAPER 3X10 WHT MICROPORE (GAUZE/BANDAGES/DRESSINGS) ×2 IMPLANT
TOWEL OR 17X24 6PK STRL BLUE (TOWEL DISPOSABLE) ×2 IMPLANT
TOWEL OR 17X26 10 PK STRL BLUE (TOWEL DISPOSABLE) ×2 IMPLANT
TRAY FOLEY IC TEMP SENS 14FR (CATHETERS) ×2 IMPLANT
TUBE SUCT INTRACARD DLP 20F (MISCELLANEOUS) ×2 IMPLANT
TUBING INSUFFLATION 10FT LAP (TUBING) ×2 IMPLANT
UNDERPAD 30X30 INCONTINENT (UNDERPADS AND DIAPERS) ×2 IMPLANT
WATER STERILE IRR 1000ML POUR (IV SOLUTION) ×4 IMPLANT

## 2011-07-29 NOTE — Brief Op Note (Signed)
07/24/2011 - 07/29/2011  4:18 PM  PATIENT:  [name]  71 y.o. female  PRE-OPERATIVE DIAGNOSIS:  Atherosclerotic coronary occlusive disease  POST-OPERATIVE DIAGNOSIS:  Atherosclerotic coronary occlusive disease  PROCEDURE:  Procedure(s): CORONARY ARTERY BYPASS GRAFTING (CABG)x3 (LIMA-LAD; SVG-DIAG; SVG-RCA)  SURGEON:  Surgeon(s): Gaye Pollack, MD  PHYSICIAN ASSISTANT: Jadene Pierini PA-C  ANESTHESIA:   general  PATIENT CONDITION:  ICU - intubated and hemodynamically stable.  PRE-OPERATIVE WEIGHT: [id number]  COMPLICATIONS: No Known

## 2011-07-29 NOTE — Procedures (Signed)
Extubation Procedure Note  Patient Details:   Name: [name] DOB: [birth year] MRN: [id number]   Airway Documentation:  AIRWAYS 8 mm (Active)  Secured at (cm) 23 cm 07/29/2011 12:00 AM    Evaluation  O2 sats: stable throughout Complications: No apparent complications Patient did tolerate procedure well. Bilateral Breath Sounds: Clear;Diminished   Yes  Seward Carol  07/29/2011, 9:55 PM  Patient was weaned, performed SBT, was coached on deep breathing, cough, and was extubated to a 4 L/M nasal cannula.  No evidence of stridor present.

## 2011-07-29 NOTE — Progress Notes (Signed)
Patient ID: [name], female   DOB: 19-Nov-1940, 71 y.o.   MRN: [id number] S/p CABG x 3 Intubated, sedated Minimal CT output BP 147/75  Pulse 77  Temp(Src) 98.4 F (36.9 C) (Oral)  Resp 13  Ht 5\' 3"  (1.6 m)  Wt 198 lb 6.6 oz (90 kg)  BMI 35.15 kg/m2  SpO2 99% Continue current care

## 2011-07-29 NOTE — OR Nursing (Signed)
Time Out performed at 1405 prior to leg incision at 1407. Second Time Out performed at 1413 with Dr. Cyndia Bent present; chest incision at 1421.

## 2011-07-29 NOTE — Anesthesia Preprocedure Evaluation (Addendum)
Anesthesia Evaluation  Patient identified by MRN, date of birth, ID band Patient awake    Reviewed: Allergy & Precautions, H&P , NPO status , Patient's Chart, lab work & pertinent test results  Airway Mallampati: I TM Distance: >3 FB Neck ROM: Full    Dental   Pulmonary          Cardiovascular hypertension, Pt. on home beta blockers + angina + CAD     Neuro/Psych CVA, No Residual Symptoms    GI/Hepatic   Endo/Other  Diabetes mellitus-, Type 2  Renal/GU      Musculoskeletal   Abdominal   Peds  Hematology   Anesthesia Other Findings   Reproductive/Obstetrics                          Anesthesia Physical Anesthesia Plan  ASA: III  Anesthesia Plan: General   Post-op Pain Management:    Induction: Intravenous  Airway Management Planned: Oral ETT  Additional Equipment:   Intra-op Plan:   Post-operative Plan: Post-operative intubation/ventilation  Informed Consent: I have reviewed the patients History and Physical, chart, labs and discussed the procedure including the risks, benefits and alternatives for the proposed anesthesia with the patient or authorized representative who has indicated his/her understanding and acceptance.     Plan Discussed with: CRNA and Surgeon  Anesthesia Plan Comments:        Anesthesia Quick Evaluation

## 2011-07-29 NOTE — Transfer of Care (Signed)
Immediate Anesthesia Transfer of Care Note  Patient: [name]  Procedure(s) Performed:  CORONARY ARTERY BYPASS GRAFTING (CABG)  Patient Location: SICU  Anesthesia Type: General  Level of Consciousness: unresponsive  Airway & Oxygen Therapy: Patient remains intubated per anesthesia plan and Patient placed on Ventilator (see vital sign flow sheet for setting)  Post-op Assessment: Post -op Vital signs reviewed and stable  Post vital signs: stable Filed Vitals:   07/29/11 1210  BP: 147/75  Pulse: 74  Temp:   Resp:     Complications: No apparent anesthesia complications

## 2011-07-30 ENCOUNTER — Inpatient Hospital Stay (HOSPITAL_COMMUNITY): Payer: Medicare Other

## 2011-07-30 ENCOUNTER — Other Ambulatory Visit: Payer: Self-pay

## 2011-07-30 ENCOUNTER — Encounter (HOSPITAL_COMMUNITY): Payer: Self-pay | Admitting: Surgery

## 2011-07-30 LAB — CBC
Hemoglobin: 10.1 g/dL — ABNORMAL LOW (ref 12.0–15.0)
MCH: 30.3 pg (ref 26.0–34.0)
MCH: 30.4 pg (ref 26.0–34.0)
MCHC: 33.5 g/dL (ref 30.0–36.0)
MCHC: 33.9 g/dL (ref 30.0–36.0)
MCHC: 34.2 g/dL (ref 30.0–36.0)
Platelets: 135 10*3/uL — ABNORMAL LOW (ref 150–400)
Platelets: 148 10*3/uL — ABNORMAL LOW (ref 150–400)
RDW: 15 % (ref 11.5–15.5)
WBC: 9.6 10*3/uL (ref 4.0–10.5)

## 2011-07-30 LAB — BASIC METABOLIC PANEL
GFR calc Af Amer: 28 mL/min — ABNORMAL LOW (ref >90)
GFR calc non Af Amer: 24 mL/min — ABNORMAL LOW (ref >90)
Glucose, Bld: 122 mg/dL — ABNORMAL HIGH (ref 70–99)
Potassium: 4.5 mEq/L (ref 3.5–5.1)
Sodium: 136 mEq/L (ref 135–145)

## 2011-07-30 LAB — MRSA PCR SCREENING: MRSA by PCR: NEGATIVE

## 2011-07-30 LAB — GLUCOSE, CAPILLARY
Glucose-Capillary: 109 mg/dL — ABNORMAL HIGH (ref 70–99)
Glucose-Capillary: 96 mg/dL (ref 70–99)
Glucose-Capillary: 123 mg/dL — ABNORMAL HIGH (ref 70–99)
Glucose-Capillary: 121 mg/dL — ABNORMAL HIGH (ref 70–99)
Glucose-Capillary: 108 mg/dL — ABNORMAL HIGH (ref 70–99)
Glucose-Capillary: 120 mg/dL — ABNORMAL HIGH (ref 70–99)
Glucose-Capillary: 123 mg/dL — ABNORMAL HIGH (ref 70–99)
Glucose-Capillary: 255 mg/dL — ABNORMAL HIGH (ref 70–99)
Glucose-Capillary: 226 mg/dL — ABNORMAL HIGH (ref 70–99)
Glucose-Capillary: 356 mg/dL — ABNORMAL HIGH (ref 70–99)

## 2011-07-30 LAB — POCT I-STAT, CHEM 8
Calcium, Ion: 1.09 mmol/L — ABNORMAL LOW (ref 1.12–1.32)
Creatinine, Ser: 2.3 mg/dL — ABNORMAL HIGH (ref 0.50–1.10)
Glucose, Bld: 342 mg/dL — ABNORMAL HIGH (ref 70–99)
HCT: 30 % — ABNORMAL LOW (ref 36.0–46.0)
Hemoglobin: 10.2 g/dL — ABNORMAL LOW (ref 12.0–15.0)
TCO2: 22 mmol/L (ref 0–100)

## 2011-07-30 LAB — MAGNESIUM
Magnesium: 2.3 mg/dL (ref 1.5–2.5)
Magnesium: 2.7 mg/dL — ABNORMAL HIGH (ref 1.5–2.5)

## 2011-07-30 LAB — CREATININE, SERUM
Creatinine, Ser: 2.28 mg/dL — ABNORMAL HIGH (ref 0.50–1.10)
GFR calc non Af Amer: 20 mL/min — ABNORMAL LOW (ref >90)

## 2011-07-30 MED ORDER — ENOXAPARIN SODIUM 30 MG/0.3ML ~~LOC~~ SOLN
30.0000 mg | Freq: Every day | SUBCUTANEOUS | Status: DC
  Administered 2011-07-30 – 2011-08-02 (×4): 30 mg via SUBCUTANEOUS
  Filled 2011-07-30 (×5): qty 0.3

## 2011-07-30 MED ORDER — INSULIN GLARGINE 100 UNIT/ML ~~LOC~~ SOLN
30.0000 [IU] | SUBCUTANEOUS | Status: DC
  Administered 2011-07-30 – 2011-08-03 (×5): 30 [IU] via SUBCUTANEOUS
  Filled 2011-07-30: qty 3

## 2011-07-30 MED ORDER — INSULIN ASPART 100 UNIT/ML ~~LOC~~ SOLN
0.0000 [IU] | SUBCUTANEOUS | Status: DC
  Administered 2011-07-30: 20 [IU] via SUBCUTANEOUS
  Filled 2011-07-30: qty 3

## 2011-07-30 MED ORDER — INSULIN REGULAR BOLUS VIA INFUSION
0.0000 [IU] | Freq: Three times a day (TID) | INTRAVENOUS | Status: DC
  Filled 2011-07-30 (×5): qty 10

## 2011-07-30 MED ORDER — DEXTROSE 50 % IV SOLN: 25.0000 mL | INTRAVENOUS | Status: DC | PRN

## 2011-07-30 MED ORDER — INSULIN ASPART 100 UNIT/ML ~~LOC~~ SOLN
6.0000 [IU] | Freq: Three times a day (TID) | SUBCUTANEOUS | Status: DC
  Administered 2011-07-31 – 2011-08-03 (×10): 6 [IU] via SUBCUTANEOUS

## 2011-07-30 MED ORDER — SODIUM CHLORIDE 0.9 % IV SOLN
INTRAVENOUS | Status: DC
  Administered 2011-07-30: 13.2 [IU]/h via INTRAVENOUS
  Filled 2011-07-30 (×2): qty 1

## 2011-07-30 MED ORDER — SODIUM CHLORIDE 0.9 % IV SOLN
INTRAVENOUS | Status: DC
  Administered 2011-07-30: 20 mL via INTRAVENOUS
  Administered 2011-07-31: 04:00:00 via INTRAVENOUS

## 2011-07-30 NOTE — Progress Notes (Signed)
SUBJECTIVE:  POD #1 doing well  OBJECTIVE:   Vitals:   Filed Vitals:   07/30/11 0600 07/30/11 0700 07/30/11 0752 07/30/11 0800  BP: 121/44 109/46  111/43  Pulse: 80 80  67  Temp: 99.7 F (37.6 C) 99 F (37.2 C) 98.6 F (37 C) 98.6 F (37 C)  TempSrc:   Core (Comment) Core (Comment)  Resp: 17 19  17   Height:      Weight:      SpO2: 98% 96%  96%   I&O's:   Intake/Output Summary (Last 24 hours) at 07/30/11 0849 Last data filed at 07/30/11 0800  Gross per 24 hour  Intake 4352.78 ml  Output   1190 ml  Net 3162.78 ml   TELEMETRY: Reviewed telemetry pt in NSR     PHYSICAL EXAM General: Well developed, well nourished, in no acute distress Head: Eyes PERRLA, No xanthomas.   Normal cephalic and atramatic  Lungs:   Clear bilaterally to auscultation anteriorly. Heart:   HRRR S1 S2 Pulses are 2+ & equal.            No carotid bruit. No JVD.  No abdominal bruits. No femoral bruits. Abdomen: Bowel sounds are positive, abdomen soft and non-tender without masses or                  Hernia's noted. Msk:  Back normal, normal gait. Normal strength and tone for age. Extremities:   Trace edema Neuro: Alert and oriented X 3. Psych:  Good affect, responds appropriately   LABS: Basic Metabolic Panel:  Basename 07/30/11 0402 07/30/11 07/29/11 2359 07/28/11 0807  NA 136 -- 137 --  K 4.5 -- 4.4 --  CL 100 -- 105 --  CO2 22 -- -- 24  GLUCOSE 122* -- 114* --  BUN 21 -- 19 --  CREATININE 2.00* 1.86* -- --  CALCIUM 8.1* -- -- 9.2  MG 2.6* 2.7* -- --  PHOS -- -- -- --    Basename 07/30/11 0402 07/30/11  WBC 9.6 8.6  NEUTROABS -- --  HGB 10.1* 10.2*  HCT 29.8* 29.8*  MCV 89.0 88.7  PLT 131* 135*   Coag Panel:   Lab Results  Component Value Date   INR 1.44 07/29/2011   INR 1.13 07/29/2011   INR 1.0 04/20/2008    RADIOLOGY: Dg Chest Portable 1 View  07/29/2011  *RADIOLOGY REPORT*  Clinical Data: Coronary artery disease.  Status post CABG.  PORTABLE CHEST - 1 VIEW  Comparison:  07/27/2011  Findings: Endotracheal tube, Swan-Ganz catheter, NG tube and chest tubes are in place.  All appear in good position.  No pneumothorax.  Lungs are clear.  No effusions.  IMPRESSION: Clear lungs with no pneumothorax.  Original Report Authenticated By: [name], M.D.   Dg Chest Port 1 View  07/27/2011  *RADIOLOGY REPORT*  Clinical Data: Preop  PORTABLE CHEST - 1 VIEW  Comparison: 02/07/2009  Findings: Mild cardiomegaly.  Pulmonary vascularity is within normal limits.  No consolidation or lung mass.  No pleural effusion.  No interstitial edema.  IMPRESSION: Mild cardiomegaly without decompensation.  Original Report Authenticated By: Jamas Lav, M.D.      ASSESSMENT/PLAN: 1.  2 vessel obstructive ASCAD s/p CABG x 3 POD #1 2.  Mild renal insufficiency with increased creatinine 3.  DM    Sueanne Margarita, MD  07/30/2011  8:49 AM

## 2011-07-30 NOTE — Progress Notes (Deleted)
Transferred to 2013 without difficulty via wheelchair..  Tolerated transfer well.

## 2011-07-30 NOTE — Progress Notes (Addendum)
1 Day Post-Op Procedure(s) (LRB): CORONARY ARTERY BYPASS GRAFTING (CABG) (N/A) Subjective: No complaints  Objective: Vital signs in last 24 hours: Temp:  [97.9 F (36.6 C)-102.6 F (39.2 C)] 98.6 F (37 C) (01/15 0800) Pulse Rate:  [67-91] 67  (01/15 0800) Cardiac Rhythm:  [-] Atrial paced (01/15 0700)   Sinus 69 under pacer. Resp:  [12-25] 17  (01/15 0800) BP: (87-147)/(27-77) 111/43 mmHg (01/15 0800) SpO2:  [95 %-100 %] 96 % (01/15 0800) FiO2 (%):  [40 %-50 %] 40 % (01/14 2123) Weight:  [90 kg (198 lb 6.6 oz)-96.4 kg (212 lb 8.4 oz)] 96.4 kg (212 lb 8.4 oz) (01/15 0500)  Hemodynamic parameters for last 24 hours: PAP: (22-39)/(10-26) 34/19 mmHg CO:  [3.1 L/min-4.3 L/min] 4.3 L/min CI:  [1.6 L/min/m2-2.4 L/min/m2] 2.4 L/min/m2  Intake/Output from previous day: 01/14 0701 - 01/15 0700 In: 4290.3 [I.V.:3313.3; Blood:547; NG/GT:30; IV Piggyback:400] Out: 1175 [Urine:925; Chest Tube:250] Intake/Output this shift:    General appearance: alert and cooperative Neurologic: intact Heart: regular rate and rhythm, S1, S2 normal, no murmur, click, rub or gallop Lungs: clear to auscultation bilaterally Extremities: edema mild Wound: dressings dry  Lab Results:  Basename 07/30/11 0402 07/30/11  WBC 9.6 8.6  HGB 10.1* 10.2*  HCT 29.8* 29.8*  PLT 131* 135*   BMET:  Basename 07/30/11 0402 07/30/11 07/29/11 2359 07/28/11 0807  NA 136 -- 137 --  K 4.5 -- 4.4 --  CL 100 -- 105 --  CO2 22 -- -- 24  GLUCOSE 122* -- 114* --  BUN 21 -- 19 --  CREATININE 2.00* 1.86* -- --  CALCIUM 8.1* -- -- 9.2    PT/INR:  Basename 07/29/11 1805  LABPROT 17.8*  INR 1.44   ABG    Component Value Date/Time   PHART 7.303* 07/29/2011 2354   HCO3 23.8 07/29/2011 2354   TCO2 24 07/29/2011 2359   ACIDBASEDEF 2.0 07/29/2011 2354   O2SAT 95.0 07/29/2011 2354   CBG (last 3)   Basename 07/29/11 1157 07/29/11 0801 07/28/11 2205  GLUCAP 194* 246* 207*   CXR:  Stable postop   ECG:  NSR 67.   Inferior T wave abn. Assessment/Plan: S/P Procedure(s) (LRB): CORONARY ARTERY BYPASS GRAFTING (CABG) (N/A) Mobilize Diabetes control d/c tubes/lines See progression orders Chronic kidney disease with creat 1.8 preop. Observe for now and hold off on diuretic.  LOS: 6 days    Renny Remer K 07/30/2011

## 2011-07-30 NOTE — Plan of Care (Deleted)
Problem: Phase III Progression Outcomes Goal: Transfer to PCTU/Telemetry POD Outcome: Completed/Met Date Met:  07/30/11 Transferred to 2013 @ 1100 Goal: Time patient transferred to PCTU/Telemetry POD Outcome: Completed/Met Date Met:  07/30/11 @1100 

## 2011-07-30 NOTE — Anesthesia Postprocedure Evaluation (Signed)
  Anesthesia Post-op Note  Patient: [name]  Procedure(s) Performed:  CORONARY ARTERY BYPASS GRAFTING (CABG)  Patient Location: PACU and ICU  Anesthesia Type: General  Level of Consciousness: sedated  Airway and Oxygen Therapy: Patient remains intubated per anesthesia plan  Post-op Pain: none  Post-op Assessment: Post-op Vital signs reviewed  Post-op Vital Signs: stable  Complications: No apparent anesthesia complications

## 2011-07-30 NOTE — Op Note (Signed)
NAMEBONNITA, [name]               ACCOUNT NO.:  [id number]  MEDICAL RECORD NO.:  [id number]  LOCATION:  2307                         FACILITY:  [facility]  PHYSICIAN:  Gilford Raid, M.D.     DATE OF BIRTH:  Apr 21, 1941  DATE OF PROCEDURE: DATE OF DISCHARGE:                              OPERATIVE REPORT   PREOPERATIVE DIAGNOSIS:  Severe multivessel coronary artery disease with unstable angina.  POSTOPERATIVE DIAGNOSIS:  Severe multivessel coronary artery disease with unstable angina.  OPERATIVE PROCEDURE:  Median sternotomy, extracorporeal circulation, coronary artery bypass graft surgery x3 using a left internal mammary artery graft to the left anterior descending coronary artery with a saphenous vein graft to the diagonal branch of the LAD, and a saphenous vein graft to the right coronary artery.  Endoscopic vein harvesting from the right leg.  ATTENDING SURGEON:  Gilford Raid, MD  ASSISTANT:  Jadene Pierini, PA-C.  ANESTHESIA:  General endotracheal.  CLINICAL HISTORY:  This patient is a 71 year old woman with a history of diabetes, hypertension, hyperlipidemia, as well as cerebral vascular disease, status post left carotid endarterectomy in the past.  She has history of known chronic kidney disease with creatinine of 1.6 to 1.8. She presented with about a 1-year history of episodic substernal chest discomfort associated with shortness of breath and exertional fatigue. These episodes have been occurring more frequently and have been occurring at rest.  During the week prior to admission, she had taken multiple sublingual nitroglycerin tablets for relief.  Cardiac catheterization showed a high-grade proximal LAD stenosis of about 90% and then about 40% distal stenosis.  There was a diagonal artery that also had a high-grade proximal stenosis.  The left circumflex gave off a large branching marginal that had no disease.  Beyond this marginal, there was high-grade stenosis involving  the distal left circumflex which only supplied 2 tiny vessels.  The right coronary artery was a large vessel that had about 90% proximal stenosis.  At the time of admission, she was also noted to have anemia with a hemoglobin of 7.8.  She underwent GI evaluation including upper and lower endoscopy which showed no signs of active bleeding and no definite cause of her anemia.  She remained stable without further chest pain.  We felt that coronary artery bypass graft surgery is best treatment to prevent further ischemia and infarction.  I discussed the operative procedure with the patient and her family including alternatives, benefits, and risks including, but not limited to bleeding, blood transfusion, infection, stroke, myocardial infarction, graft failure, and death.  They understood and agreed to proceed.  PROCEDURE IN DETAIL:  The patient was taken to the operative room and placed on table in supine position.  After induction of general endotracheal anesthesia, a Foley catheter was placed in bladder using sterile technique.  Then, the chest, abdomen, and both lower extremities were prepped and draped in usual sterile manner.  Chest was entered through a median sternotomy incision.  The pericardium opened in midline.  Examination of the heart showed good ventricular contractility.  The ascending aorta had no palpable plaques in it.  Then the left internal mammary artery was harvested from chest wall as a pedicle graft.  This was a medium caliber vessel with excellent blood flow through it.  Same time, a segment of greater saphenous vein was harvested from the right leg using endoscopic vein harvest technique. This vein was of medium size and good quality.  Then the patient was heparinized and when adequate activated clotting time was achieved, the distal ascending aorta was cannulated using a 20- [place] aortic cannula for arterial inflow.  Venous outflow was achieved using a  two-stage venous cannula for the right atrial appendage.  An antegrade cardioplegia and vent cannula was inserted in aortic root.  The patient placed on cardiopulmonary bypass and distal coronary artery identified.  The LAD was intramyocardial along its proximal and mid portions.  It exited the surface of the heart distally where was a tiny vessel.  I was able to locate this vessel in the proximal and midportion within the muscle and there was a large graftable vessel with no significant disease here.  The 1st diagonal branch was heavily diseased proximally but was graftable beyond the disease.  The right coronary artery was a large vessel with no significant distal disease in it.  Then the aorta was crossclamped and 1000 mL of cold blood antegrade cardioplegia was administered in the aortic root with quick arrest of the heart.  Systemic hypothermia to 32 degrees centigrade and topical hypothermic iced saline was used.  A temperature probe was placed in the septum insulating pad in the pericardium.  First distal anastomosis was performed to the distal right coronary artery.  The internal diameter vessel was about 2.5 mm.  Conduit used was a segment of greater saphenous vein and the anastomosis performed end-to-side manner using continuous 7-0 Prolene suture.  Flow was noted through the graft was excellent.  Second distal anastomosis was performed to the diagonal branch.  The internal diameter was 1.6 mm.  The conduit used was a 2nd segment of greater saphenous vein and anastomosis performed end-to-side manner using continuous 7-0 Prolene suture.  Flow was noted through the graft was excellent.  Another dose of cardioplegia was given down the vein graft and aortic root.  The third distal anastomosis was performed to the mid LAD.  The internal diameter of this vessel was about 2 mm.  The conduit used was a left internal mammary graft, was brought through an opening left  pericardium anterior to the phrenic nerve.  It was anastomosed to the LAD in end-to- side manner using continuous 8-0 Prolene suture.  The pedicle was sutured to the epicardium with 6-0 Prolene sutures.  The patient was then rewarmed to 37 degrees centigrade.  With crossclamp in place, the 2 proximal vein graft anastomoses were performed to the mid ascending aorta in end-to-side manner using continuous 6-0 Prolene suture.  Then, the clamp was removed from mammary pedicle.  There was rapid warming of the ventricular septum and return of spontaneous ventricular fibrillation.  Crossclamp was removed, time of 64 minutes, and the patient spontaneously converted to sinus rhythm.  The proximal and distal anastomoses appeared hemostatic and allowed the grafts satisfactory.  Graft markers placed around the proximal anastomoses. Two temporary right ventricular and right atrial pacing wires were placed and brought through the skin.  When the patient rewarmed to 37 degrees centigrade, she was weaned from cardiopulmonary bypass on no inotropic agents.  Total bypass time was 79 minutes.  Cardiac function appeared excellent.  Cardiac output of 4 L/minute.  Protamine was given and the venous and aortic cannula was removed without difficulty.  Hemostasis  was achieved.  The patient was given 1 unit of platelets due to thrombocytopenia with platelet count about 80,000.  Then 3 chest tubes were placed with 2 in the post pericardium, one in left pleural space and one in the anterior mediastinum.  The sternum was then closed with double #6 stainless steel wires.  The fascia was closed with continuous #1 Vicryl suture. Subcutaneous tissue was closed with continuous 2-0 Vicryl and the skin with a 3-0 Vicryl subcuticular closure.  Lower extremity vein harvest site was closed in layers in similar manner.  The sponge, needle, and instrument counts were correct according to the scrub nurse.  Dry sterile dressing  applied over the incisions around the chest tubes which were hooked with Pleur-Evac suction.  The patient remained hemodynamically stable and was transferred to the SICU in guarded, stable condition.     Gilford Raid, M.D.     BB/MEDQ  D:  07/29/2011  T:  07/30/2011  Job:  [id number]  cc:   Jettie Booze, MD

## 2011-07-30 NOTE — Progress Notes (Signed)
Patient ID: [name], female   DOB: [birth year], 71 y.o.   MRN: [id number]   Filed Vitals:   07/30/11 1700 07/30/11 1800 07/30/11 1900 07/30/11 1945  BP: 143/47 129/42 77/54   Pulse: 69 67 73   Temp:    98.3 F (36.8 C)  TempSrc:    Oral  Resp: 12 15 16    Height:      Weight:      SpO2: 96% 96% 95%    Urine output 30-45 per hour BMET    Component Value Date/Time   NA 133* 07/30/2011 1639   K 4.4 07/30/2011 1639   CL 102 07/30/2011 1639   CO2 22 07/30/2011 0402   GLUCOSE 342* 07/30/2011 1639   BUN 25* 07/30/2011 1639   CREATININE 2.28* 07/30/2011 1645   CALCIUM 8.1* 07/30/2011 0402   GFRNONAA 20* 07/30/2011 1645   GFRAA 24* 07/30/2011 1645    CBC    Component Value Date/Time   WBC 10.0 07/30/2011 1645   RBC 3.43* 07/30/2011 1645   HGB 10.4* 07/30/2011 1645   HCT 31.0* 07/30/2011 1645   PLT 148* 07/30/2011 1645   MCV 90.4 07/30/2011 1645   MCH 30.3 07/30/2011 1645   MCHC 33.5 07/30/2011 1645   RDW 15.3 07/30/2011 1645    Glucose increased to 300's so insulin drip restarted.  Will add meal coverage Novolog.

## 2011-07-31 ENCOUNTER — Inpatient Hospital Stay (HOSPITAL_COMMUNITY): Payer: Medicare Other

## 2011-07-31 LAB — BASIC METABOLIC PANEL
BUN: 26 mg/dL — ABNORMAL HIGH (ref 6–23)
Calcium: 8.2 mg/dL — ABNORMAL LOW (ref 8.4–10.5)
Creatinine, Ser: 2.3 mg/dL — ABNORMAL HIGH (ref 0.50–1.10)
GFR calc Af Amer: 23 mL/min — ABNORMAL LOW (ref >90)
GFR calc non Af Amer: 20 mL/min — ABNORMAL LOW (ref >90)

## 2011-07-31 LAB — CBC
HCT: 29.9 % — ABNORMAL LOW (ref 36.0–46.0)
MCHC: 33.4 g/dL (ref 30.0–36.0)
Platelets: 150 10*3/uL (ref 150–400)
RDW: 14.7 % (ref 11.5–15.5)
WBC: 10.9 10*3/uL — ABNORMAL HIGH (ref 4.0–10.5)

## 2011-07-31 LAB — GLUCOSE, CAPILLARY
Glucose-Capillary: 178 mg/dL — ABNORMAL HIGH (ref 70–99)
Glucose-Capillary: 132 mg/dL — ABNORMAL HIGH (ref 70–99)
Glucose-Capillary: 106 mg/dL — ABNORMAL HIGH (ref 70–99)
Glucose-Capillary: 101 mg/dL — ABNORMAL HIGH (ref 70–99)
Glucose-Capillary: 106 mg/dL — ABNORMAL HIGH (ref 70–99)
Glucose-Capillary: 231 mg/dL — ABNORMAL HIGH (ref 70–99)
Glucose-Capillary: 285 mg/dL — ABNORMAL HIGH (ref 70–99)

## 2011-07-31 MED ORDER — INSULIN ASPART 100 UNIT/ML ~~LOC~~ SOLN
0.0000 [IU] | Freq: Three times a day (TID) | SUBCUTANEOUS | Status: DC
  Administered 2011-07-31: 8 [IU] via SUBCUTANEOUS
  Administered 2011-07-31 – 2011-08-01 (×3): 12 [IU] via SUBCUTANEOUS
  Administered 2011-08-01: 16 [IU] via SUBCUTANEOUS
  Administered 2011-08-01: 12 [IU] via SUBCUTANEOUS
  Administered 2011-08-01: 20 [IU] via SUBCUTANEOUS
  Administered 2011-08-02: 4 [IU] via SUBCUTANEOUS
  Administered 2011-08-02: 2 [IU] via SUBCUTANEOUS
  Administered 2011-08-02: 4 [IU] via SUBCUTANEOUS
  Administered 2011-08-02: 2 [IU] via SUBCUTANEOUS
  Filled 2011-07-31: qty 3

## 2011-07-31 MED ORDER — MOVING RIGHT ALONG BOOK
Freq: Once | Status: AC
  Administered 2011-07-31: 18:00:00
  Filled 2011-07-31: qty 1

## 2011-07-31 MED ORDER — ALTEPLASE 100 MG IV SOLR
2.0000 mg | Freq: Once | INTRAVENOUS | Status: DC
  Filled 2011-07-31: qty 2

## 2011-07-31 MED ORDER — SODIUM CHLORIDE 0.9 % IJ SOLN
3.0000 mL | Freq: Two times a day (BID) | INTRAMUSCULAR | Status: DC
  Administered 2011-07-31 – 2011-08-03 (×7): 3 mL via INTRAVENOUS

## 2011-07-31 MED ORDER — SODIUM CHLORIDE 0.9 % IV SOLN: 250.0000 mL | INTRAVENOUS | Status: DC | PRN

## 2011-07-31 MED ORDER — COLCHICINE 0.6 MG PO TABS
0.3000 mg | ORAL_TABLET | Freq: Two times a day (BID) | ORAL | Status: DC
  Administered 2011-07-31: 12:00:00 via ORAL
  Administered 2011-07-31 – 2011-08-03 (×6): 0.3 mg via ORAL
  Filled 2011-07-31 (×8): qty 0.5

## 2011-07-31 MED ORDER — SODIUM CHLORIDE 0.9 % IJ SOLN: 3.0000 mL | INTRAMUSCULAR | Status: DC | PRN

## 2011-07-31 MED FILL — Electrolyte-R (PH 7.4) Solution: INTRAVENOUS | Qty: 6000 | Status: AC

## 2011-07-31 MED FILL — Heparin Sodium (Porcine) Inj 1000 Unit/ML: INTRAMUSCULAR | Qty: 20 | Status: AC

## 2011-07-31 MED FILL — Mannitol IV Soln 20%: INTRAVENOUS | Qty: 500 | Status: AC

## 2011-07-31 MED FILL — Potassium Chloride Inj 2 mEq/ML: INTRAVENOUS | Qty: 40 | Status: AC

## 2011-07-31 MED FILL — Lactated Ringer's Solution: INTRAVENOUS | Qty: 500 | Status: AC

## 2011-07-31 MED FILL — Heparin Sodium (Porcine) Inj 1000 Unit/ML: INTRAMUSCULAR | Qty: 3 | Status: AC

## 2011-07-31 MED FILL — Sodium Chloride Irrigation Soln 0.9%: Qty: 3000 | Status: AC

## 2011-07-31 MED FILL — Sodium Bicarbonate IV Soln 8.4%: INTRAVENOUS | Qty: 50 | Status: AC

## 2011-07-31 MED FILL — Heparin Sodium (Porcine) Inj 1000 Unit/ML: INTRAMUSCULAR | Qty: 30 | Status: AC

## 2011-07-31 MED FILL — Dexmedetomidine HCl IV Soln 200 MCG/2ML: INTRAVENOUS | Qty: 2 | Status: AC

## 2011-07-31 MED FILL — Sodium Chloride IV Soln 0.9%: INTRAVENOUS | Qty: 1000 | Status: AC

## 2011-07-31 MED FILL — Nitroglycerin IV Soln 5 MG/ML: INTRAVENOUS | Qty: 10 | Status: AC

## 2011-07-31 MED FILL — Magnesium Sulfate Inj 50%: INTRAMUSCULAR | Qty: 10 | Status: AC

## 2011-07-31 MED FILL — Verapamil HCl IV Soln 2.5 MG/ML: INTRAVENOUS | Qty: 4 | Status: AC

## 2011-07-31 MED FILL — Lidocaine HCl IV Inj 20 MG/ML: INTRAVENOUS | Qty: 5 | Status: AC

## 2011-07-31 NOTE — Progress Notes (Signed)
2 Days Post-Op Procedure(s) (LRB): CORONARY ARTERY BYPASS GRAFTING (CABG) (N/A) Subjective: Feels well  Objective: Vital signs in last 24 hours: Temp:  [97.7 F (36.5 C)-98.4 F (36.9 C)] 98.4 F (36.9 C) (01/16 0822) Pulse Rate:  [56-73] 64  (01/16 0800) Cardiac Rhythm:  [-] Normal sinus rhythm (01/16 0800) Resp:  [12-20] 14  (01/16 0800) BP: (77-148)/(40-72) 135/49 mmHg (01/16 0800) SpO2:  [91 %-99 %] 97 % (01/16 0800) Weight:  [97.4 kg (214 lb 11.7 oz)] 97.4 kg (214 lb 11.7 oz) (01/16 0500)  Hemodynamic parameters for last 24 hours:    Intake/Output from previous day: 01/15 0701 - 01/16 0700 In: 1150.4 [P.O.:60; I.V.:1040.4; IV Piggyback:50] Out: 1145 [id number] Intake/Output this shift: Total I/O In: 45.4 [I.V.:45.4] Out: 80 [Urine:80]  General appearance: alert and cooperative Heart: regular rate and rhythm, S1, S2 normal, no murmur, click, rub or gallop Lungs: clear to auscultation bilaterally Extremities: edema mild Wound: incision ok  Lab Results:  Basename 07/31/11 0400 07/30/11 1645  WBC 10.9* 10.0  HGB 10.0* 10.4*  HCT 29.9* 31.0*  PLT 150 148*   BMET:  Basename 07/31/11 0400 07/30/11 1645 07/30/11 1639 07/30/11 0402  NA 136 -- 133* --  K 3.9 -- 4.4 --  CL 101 -- 102 --  CO2 23 -- -- 22  GLUCOSE 94 -- 342* --  BUN 26* -- 25* --  CREATININE 2.30* 2.28* -- --  CALCIUM 8.2* -- -- 8.1*    PT/INR:  Basename 07/29/11 1805  LABPROT 17.8*  INR 1.44   ABG    Component Value Date/Time   PHART 7.303* 07/29/2011 2354   HCO3 23.8 07/29/2011 2354   TCO2 22 07/30/2011 1639   ACIDBASEDEF 2.0 07/29/2011 2354   O2SAT 95.0 07/29/2011 2354   CBG (last 3)   Basename 07/31/11 0844 07/31/11 0725 07/31/11 0618  GLUCAP 106* 111* 101*   CXR:  Mild bibasilar atelectasis  Assessment/Plan: S/P Procedure(s) (LRB): CORONARY ARTERY BYPASS GRAFTING (CABG) (N/A) Acute on chronic renal failure:  Stable creatinine.  Continue observation Diabetes:  Glucose under  good control this am.  Will continue meal coverage, SSI, and Lantus for now. 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 Transfer to 2000 and continue mobilization   LOS: 7 days    BARTLE,BRYAN K 07/31/2011

## 2011-07-31 NOTE — Significant Event (Signed)
Insulin drip turned off at 0830am, 2 hours after pt received Lantus 30units. Will check CBG and monitor. Tima Curet, Therapist, sports

## 2011-07-31 NOTE — Progress Notes (Signed)
Pt. Ambulated 200 ft in hallway with walker and one person assist. No complaints of pain, pt tolerated well, and is now resting in bed. Will continue to monitor.

## 2011-07-31 NOTE — Plan of Care (Signed)
Problem: Phase II Progression Outcomes Goal: CBGs/Blood glucose < or equal to 120 Outcome: Progressing Pt on insulin drip per glucostabilizer.

## 2011-07-31 NOTE — Progress Notes (Signed)
CARDIAC REHAB PHASE I   PRE:  Rate/Rhythm: 75SR  BP:  Supine:   Sitting: 130/70  Standing:    SaO2: 95%RA  MODE:  Ambulation: 300 ft   POST:  Rate/Rhythem: 84  BP:  Supine: 142/80  Sitting:   Standing:    SaO2: 96%RA 1430-1515 Pt walked 300 ft on RA with rolling walker and asst x 1. Pt stood independently. Had reviewed sternal precautions with pt. Tolerated walk well. To bed after walk. Needs rolling walker for home use as her left knee weak.  Jeani Sow

## 2011-07-31 NOTE — Progress Notes (Signed)
Courtesy visit.  Chart reviewed. Post op day 2 CABG Doing well.   Appreciate TCTS care team.

## 2011-07-31 NOTE — Significant Event (Signed)
Pt transferred to 2000. VS stable prior to transfer. Pt traveled via wheelchair with Beckie Salts, RN. Report given to receiving RN. Aran Menning, Therapist, sports.

## 2011-07-31 NOTE — Plan of Care (Signed)
Problem: Phase I Progression Outcomes Goal: Last BM documented (see I&O) Outcome: Completed/Met Date Met:  07/31/11 07/31/2011  Problem: Phase III Progression Outcomes Goal: Transfer to PCTU/Telemetry POD Outcome: Completed/Met Date Met:  07/31/11 Transferred to 2040 Goal: Time patient transferred to PCTU/Telemetry POD 1207pm

## 2011-08-01 LAB — CBC
HCT: 33.5 % — ABNORMAL LOW (ref 36.0–46.0)
MCH: 29.9 pg (ref 26.0–34.0)
MCHC: 33.1 g/dL (ref 30.0–36.0)
MCV: 90.3 fL (ref 78.0–100.0)
Platelets: 195 10*3/uL (ref 150–400)
RDW: 14.7 % (ref 11.5–15.5)

## 2011-08-01 LAB — BASIC METABOLIC PANEL
BUN: 31 mg/dL — ABNORMAL HIGH (ref 6–23)
Calcium: 8.3 mg/dL — ABNORMAL LOW (ref 8.4–10.5)
Creatinine, Ser: 2.09 mg/dL — ABNORMAL HIGH (ref 0.50–1.10)
GFR calc Af Amer: 26 mL/min — ABNORMAL LOW (ref >90)
GFR calc non Af Amer: 23 mL/min — ABNORMAL LOW (ref >90)

## 2011-08-01 LAB — GLUCOSE, CAPILLARY

## 2011-08-01 MED ORDER — METOPROLOL TARTRATE 25 MG PO TABS
25.0000 mg | ORAL_TABLET | Freq: Two times a day (BID) | ORAL | Status: DC
  Administered 2011-08-01 – 2011-08-03 (×5): 25 mg via ORAL
  Filled 2011-08-01 (×6): qty 1

## 2011-08-01 MED ORDER — POTASSIUM CHLORIDE CRYS ER 20 MEQ PO TBCR
40.0000 meq | EXTENDED_RELEASE_TABLET | Freq: Once | ORAL | Status: AC
  Administered 2011-08-01: 40 meq via ORAL
  Filled 2011-08-01: qty 2

## 2011-08-01 MED ORDER — LINAGLIPTIN 5 MG PO TABS
5.0000 mg | ORAL_TABLET | Freq: Every day | ORAL | Status: DC
  Administered 2011-08-02 – 2011-08-03 (×2): 5 mg via ORAL
  Filled 2011-08-01 (×3): qty 1

## 2011-08-01 NOTE — Progress Notes (Signed)
CARDIAC REHAB PHASE I   PRE:  Rate/Rhythm: 80 SR  BP:  Supine:   Sitting: 140/70  Standing:    SaO2: 95%RA  MODE:  Ambulation: 500 ft   POST:  Rate/Rhythem: 102  BP:  Supine:   Sitting: 170/90  Standing:    SaO2: 96%RA 1050-1120 Pt walked 500 ft on RA with rolling walker and asst x 1. Tolerated well. Notified case manager about need for rolling walker for home. Discussed CRP 2. Pt and husband may attend together. They are considering. Will discuss again later. To chair with call bell.  Jeani Sow

## 2011-08-01 NOTE — Progress Notes (Signed)
   CARE MANAGEMENT NOTE 08/01/2011  Patient:  [name], [name]   Account Number:  [id number]  Date Initiated:  07/30/2011  Documentation initiated by:  [facility]  Subjective/Objective Assessment:   Admitted with increased SOB and GIB.  Post op CABG on 07-29-11.  Has Spouse     Action/Plan:   PTA, PT INDEPENDENT, LIVES WITH SPOUSE.   Anticipated DC Date:  08/02/2011   Anticipated DC Plan:  [place]  CM consult      Choice offered to / List presented to:     DME arranged  [place]      DME agency  [place].        Status of service:  In process, will continue to follow Medicare Important Message given?   (If response is "NO", the following Medicare IM given date fields will be blank) Date Medicare IM given:   Date Additional Medicare IM given:    Discharge Disposition:    Per UR Regulation:  Reviewed for med. necessity/level of care/duration of stay  Comments:  08/01/11 Anddy Wingert,RN,BSN 1200 MET WITH PT TO DISCUSS DISCHARGE PLANS.  HUSBAND AND DAUGHTER TO PROVIDE 24HR CARE AT DC.  WILL NEED RW AND 3 IN 1 AT DC.  REFERRAL TO [facility] FOR DME NEEDS. Phone [phone]   07-30-11 3:25pm Luz Lex,  [place] UR Completed.

## 2011-08-01 NOTE — Progress Notes (Signed)
Pt ambulated 500 ft with walker and one person assist in the hallway. Pt tolerated well with no complaints of pain or shortness of breath. Pt now resting in bed.

## 2011-08-01 NOTE — Progress Notes (Addendum)
                    [address]            [place], [address]          [phone]     3 Days Post-Op Procedure(s) (LRB): CORONARY ARTERY BYPASS GRAFTING (CABG) (N/A)  Subjective: Feels well, no complaints.  Objective: Vital signs in last 24 hours: Patient Vitals for the past 24 hrs:  BP Temp Temp src Pulse Resp SpO2 Weight  08/01/11 0545 - - - - - - 96.888 kg (213 lb 9.6 oz)  08/01/11 0433 159/74 mmHg 96.6 F (35.9 C) Axillary 80  18  92 % -  07/31/11 2005 163/73 mmHg 97.7 F (36.5 C) Oral 83  20  94 % -  07/31/11 1400 144/65 mmHg 98.1 F (36.7 C) Oral 77  18  95 % -  07/31/11 1200 147/68 mmHg 98 F (36.7 C) Oral 84  23  96 % -  07/31/11 1000 148/45 mmHg - - 77  16  98 % -  07/31/11 0938 - - - - - 96 % -  07/31/11 0900 135/55 mmHg - - 73  18  96 % -  07/31/11 0822 - 98.4 F (36.9 C) Oral - - - -  07/31/11 0800 135/49 mmHg - - 64  14  97 % -   Current Weight  08/01/11 96.888 kg (213 lb 9.6 oz)   Pre-op wt= 90 kg  Intake/Output from previous day: 01/16 0701 - 01/17 0700 In: 1075.4 [P.O.:840; I.V.:85.4; IV Piggyback:150] Out: 2006 [Urine:2005; Stool:1]  CBGs [phone]  PHYSICAL EXAM:  Heart: RRR Lungs: clear Wound: clean and dry Extremities: mild LE edema  Lab Results: CBC: Basename 08/01/11 0530 07/31/11 0400  WBC 10.0 10.9*  HGB 11.1* 10.0*  HCT 33.5* 29.9*  PLT 195 150   BMET:  Basename 08/01/11 0530 07/31/11 0400  NA 134* 136  K 3.3* 3.9  CL 99 101  CO2 23 23  GLUCOSE 278* 94  BUN 31* 26*  CREATININE 2.09* 2.30*  CALCIUM 8.3* 8.2*    PT/INR:  Basename 07/29/11 1805  LABPROT 17.8*  INR 1.44     Assessment/Plan: S/P Procedure(s) (LRB): CORONARY ARTERY BYPASS GRAFTING (CABG) (N/A) CV- stable.  BPs trending up. Will increase beta blocker to 25 mg bid (was on Toprol XL 50mg  qd at home) and monitor. DM- sugars elevated. A1C=6.4 Holding metformin secondary to elevated Cr.  Will resume Januvia, continue Lantus, SSI  for now. ARI- Cr trending down.  Continue to monitor. CRPI, pulm toilet   LOS: 8 days    COLLINS,GINA H 08/01/2011    Chart reviewed, patient examined, agree with above. K+ decreased to 3.3.  Will repleat since creatinine is decreasing.

## 2011-08-01 NOTE — Progress Notes (Signed)
Inpatient Diabetes Program Recommendations  AACE/ADA: New Consensus Statement on Inpatient Glycemic Control (2009)  Target Ranges:  Prepandial:   less than 140 mg/dL      Peak postprandial:   less than 180 mg/dL (1-2 hours)      Critically ill patients:  140 - 180 mg/dL   Reason: CBGs 267, 318  Inpatient Diabetes Program Recommendations Insulin - Basal: Increase Lantus to 36  REcheck A1C.  CBGs not consistent with last result.

## 2011-08-01 NOTE — Progress Notes (Signed)
Pt ambulated 500 ft with RW, min assist.  No complaints of SOB, fatigue, pain, or dizziness.  In fact Pt stated, "This is the fastest I've walked in over a year."  Pt returned to chair with husband at side, call bell in reach.  Will con't to monitor.

## 2011-08-02 LAB — BASIC METABOLIC PANEL
BUN: 30 mg/dL — ABNORMAL HIGH (ref 6–23)
CO2: 25 mEq/L (ref 19–32)
Chloride: 103 mEq/L (ref 96–112)
GFR calc Af Amer: 29 mL/min — ABNORMAL LOW (ref >90)
Glucose, Bld: 163 mg/dL — ABNORMAL HIGH (ref 70–99)
Potassium: 3.7 mEq/L (ref 3.5–5.1)

## 2011-08-02 LAB — GLUCOSE, CAPILLARY
Glucose-Capillary: 153 mg/dL — ABNORMAL HIGH (ref 70–99)
Glucose-Capillary: 146 mg/dL — ABNORMAL HIGH (ref 70–99)

## 2011-08-02 MED ORDER — LINAGLIPTIN 5 MG PO TABS: 5.0000 mg | ORAL_TABLET | Freq: Every day | ORAL | Status: DC

## 2011-08-02 MED ORDER — INSULIN GLARGINE 100 UNIT/ML ~~LOC~~ SOLN: 30.0000 [IU] | SUBCUTANEOUS | Status: DC

## 2011-08-02 MED ORDER — AMLODIPINE BESYLATE 5 MG PO TABS
5.0000 mg | ORAL_TABLET | Freq: Every day | ORAL | Status: DC
  Administered 2011-08-02 – 2011-08-03 (×2): 5 mg via ORAL
  Filled 2011-08-02 (×2): qty 1

## 2011-08-02 MED ORDER — OXYCODONE HCL 5 MG PO TABS: 5.0000 mg | ORAL_TABLET | ORAL | Status: AC | PRN

## 2011-08-02 MED ORDER — INSULIN ASPART 100 UNIT/ML ~~LOC~~ SOLN: 6.0000 [IU] | Freq: Three times a day (TID) | SUBCUTANEOUS | Status: DC

## 2011-08-02 NOTE — Progress Notes (Signed)
CARDIAC REHAB PHASE I   PRE:  Rate/Rhythm: 77SR  BP:  Supine:   Sitting: 110/70  Standing:    SaO2: 97%RA  MODE:  Ambulation: 700 ft   POST:  Rate/Rhythem: 95  BP:  Supine:   Sitting:   Standing:  Got telephone call   SaO2: 97%RA 0958-1020 Pt walked 700 ft on RA with rolling walker and minimal asst. Tolerated well. Pt states she has not been able to walk this far in 2 years. Has RW and BSC for home use. Back to chair with call bell after walk. Will ed when family gets here.  [name]

## 2011-08-02 NOTE — Progress Notes (Signed)
Cardiac Rehab [phone] Education completed with pt and family. Permission given to refer to [facility] Phase 2. Debra Borgwardt DunlapRN

## 2011-08-02 NOTE — Progress Notes (Signed)
Removed EPwires at 11:30 am. 4 wires intact.  Pt tolerated procedure well. No episodes of ectopy. Pt on bedrest for one hour.  Pt resting with call bell within reach.  Daughter at bedside.  Will continue to monitor.

## 2011-08-02 NOTE — Progress Notes (Addendum)
4 Days Post-Op Procedure(s) (LRB): CORONARY ARTERY BYPASS GRAFTING (CABG) (N/A)  Subjective: No complaints. Bowels working well  Objective: Vital signs in last 24 hours: Temp:  [98 F (36.7 C)-98.1 F (36.7 C)] 98 F (36.7 C) (01/18 0651) Pulse Rate:  [79-87] 87  (01/18 0651) Cardiac Rhythm:  [-] Normal sinus rhythm (01/18 0855) Resp:  [18] 18  (01/18 0651) BP: (150-180)/(70-80) 178/76 mmHg (01/18 0651) SpO2:  [94 %-96 %] 94 % (01/18 0651) Weight:  [96.072 kg (211 lb 12.8 oz)] 96.072 kg (211 lb 12.8 oz) (01/18 0651)  Hemodynamic parameters for last 24 hours:    Intake/Output from previous day: 01/17 0701 - 01/18 0700 In: 600 [P.O.:600] Out: 751 [Urine:750; Stool:1] Intake/Output this shift:    General appearance: alert and cooperative Heart: regular rate and rhythm, S1, S2 normal, no murmur, click, rub or gallop Lungs: clear to auscultation bilaterally Extremities: extremities normal, atraumatic, no cyanosis or edema Wound: incision ok  Lab Results:  Basename 08/01/11 0530 07/31/11 0400  WBC 10.0 10.9*  HGB 11.1* 10.0*  HCT 33.5* 29.9*  PLT 195 150   BMET:  Basename 08/02/11 0400 08/01/11 0530  NA 141 134*  K 3.7 3.3*  CL 103 99  CO2 25 23  GLUCOSE 163* 278*  BUN 30* 31*  CREATININE 1.93* 2.09*  CALCIUM 8.2* 8.3*    PT/INR: No results found for this basename: LABPROT,INR in the last 72 hours ABG    Component Value Date/Time   PHART 7.303* 07/29/2011 2354   HCO3 23.8 07/29/2011 2354   TCO2 22 07/30/2011 1639   ACIDBASEDEF 2.0 07/29/2011 2354   O2SAT 95.0 07/29/2011 2354   CBG (last 3)   Basename 08/01/11 2048 08/01/11 1614 08/01/11 1116  GLUCAP 268* 352* 318*    Assessment/Plan: S/P Procedure(s) (LRB): CORONARY ARTERY BYPASS GRAFTING (CABG) (N/A) Doing well.  Continue ambulation and IS Will increase Norvasc for HTN CRF continues to improve. Diabetes: Glucose still elevated yesterday.  She was on Metformin at home but this should not be restarted  with renal failure. She probably needs to be on insulin. Probably home tomorrow   LOS: 9 days    BARTLE,BRYAN K 08/02/2011

## 2011-08-02 NOTE — Discharge Summary (Signed)
Physician Discharge Summary  Patient ID: JESTINA TIMMER MRN: [id number] DOB/AGE: 72-Oct-1942 72 y.o.  Admit date: 07/24/2011 Discharge date: 08/02/2011  Admission Diagnoses: 1.Multivessel CAD 2.History of DM 3.History of hypertension 4.History of hyperlipidemia 5.History of possible CVA 6.History of chronic kidney disease (baseline Cr 1.6-1.8) 7.History of gout 8.Hisoty of GERD 9.History of peripheral neuropathy 10.History of carotid artery disease (s/p left CEA,50-60% right ICA stenosis) 11.Anemia  Discharge Diagnoses:  1.Multivessel CAD 2.History of DM 3.History of hypertension 4.History of hyperlipidemia 5.History of possible CVA 6.History of chronic kidney disease (baseline Cr 1.6-1.8) 7.History of gout 8.Hisoty of GERD 9.History of peripheral neuropathy 10.History of carotid artery disease (s/p left CEA,50-60% right ICA stenosis) 11.Anemia   Procedure (s):  (1.) Cardiac Catheterization by Dr. Irish Lack on 07/24/2011 1. Normal left main coronary artery. 2. Severely diseased proximal LAD with heavy calcification. Most severe stenosis of 90%. 3. Normal proximal to mid left circumflex artery and its branches. The small, terminal branches of the circumflex had a 90% stenosis. 4. Large dominant right coronary artery with a focal proximal 80% stenosis.  5. LVEDP 15 mmHg. Ejection fraction not assessed to minimize contrast exposure. (2) EGD and colonoscopy by Dr. 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 No explanation for the patient's anemia  RECOMMENDATIONS: The patient should be able to go ahead with  cardiac surgery as planned. We'll need to make sure that iron  levels are adequate. Would recommend resuming Hemoccults in 3  years and  repeat colonoscopy in 10 years.  (3) Median sternotomy, extracorporeal circulation,  coronary artery bypass graft surgery x3 using a left internal mammary  artery graft to the left anterior descending coronary artery with a  saphenous vein graft to the diagonal branch of the LAD, and a saphenous  vein graft to the right coronary artery. Endoscopic vein harvesting  from the right leg by Dr. Cyndia Bent on 07/29/2011  History of Presenting Illness: She is a 71 year old woman with a history of diabetes, hypertension, and hyperlipidemia, as well as cerebrovascular and carotid artery disease (status post left carotid endarterectomy), and known chronic kidney disease with a creatinine of 1.6-1.8. She presents with about a one-year history of episodic substernal chest discomfort associated with shortness of breath and exertional fatigue. These episodes are becoming more frequent and recently have been occurring at rest. Over the past week, she has taken multiple sublingual nitroglycerin tablets for relief. She underwent cardiac catheterization on 07/24/2011. which showed high-grade proximal LAD and diagonal stenosis as well as a high-grade proximal right coronary stenosis. She was also noted to have anemia with a hemoglobin around 7.8. A gastroenterology consult was obtained with Dr. Oletta Lamas. He did an EGD and colonoscopy (for results please see above). Ultimately, she was cleared for cardiac surgery. Potential benefits, risks, and complications were discussed with the patient and she agreed to proceed. She underwent a CABG x 3 on 07/29/2011.   Brief Hospital Course:  Patient was extubated without difficulty early the evening of surgery. She remained afebrile and hemodynamically stable. Her swan ganze, a line, chest tubes and foley were all removed early in her post operative course.As previously stated, she has a history of chronic kidney disease. Her creatinine went as high as 2.3 post op.Nephrotoxic agents were  avoided. Her last  creatinine was  down to 1.93. She was started on a low dose Lopressor and this was titrated accordingly.  She has a history of DM. She was weaned off her insulin gttp. She was started on Tradjenta and insulin. Preoperatively, she took Januvia 50 daily and Metformin 1000 po bid. Neither of these were restarted started secondary to her elevated creatinine.She has a history of gout and developed this in her left knee. She was treated with Allopurinol and Colchicine. She was felt surgically stable for transfer from the ICU to 2000 for further  Convalescence on 07/31/2011.She has been tolerating a diet and has had a bowel movement. Her EPW and chest tube sutures will be removed prior to her discharge.  Provided she remains afebrile,hemocnamically stable, and pending morning round evaluation, she will be surgically stable for discharge on 08/03/2011.   Filed Vitals:   08/02/11 1315  BP: 175/76  Pulse: 73  Temp: 98.3 F (36.8 C)  Resp: 18     Latest Vital Signs: Blood pressure 175/76, pulse 73, temperature 98.3 F (36.8 C), temperature source Oral, resp. rate 18, height 5\' 3"  (1.6 m), weight 211 lb 12.8 oz (96.072 kg), SpO2 95.00%.  Physical Exam: General appearance: alert and cooperative  Heart: regular rate and rhythm, S1, S2 normal, no murmur, click, rub or gallop  Lungs: clear to auscultation bilaterally  Extremities: extremities normal, atraumatic, no cyanosis or edema  Wound: incision ok  Discharge Condition:Stable  Recent laboratory studies:  Lab Results  Component Value Date   WBC 10.0 08/01/2011   HGB 11.1* 08/01/2011   HCT 33.5* 08/01/2011   MCV 90.3 08/01/2011   PLT 195 08/01/2011   Lab Results  Component Value Date   NA 141 08/02/2011   K 3.7 08/02/2011   CL 103 08/02/2011   CO2 25 08/02/2011   CREATININE 1.93* 08/02/2011   GLUCOSE 163* 08/02/2011      Diagnostic Studies: Dg Chest Portable 1 View In Am  07/31/2011  *RADIOLOGY REPORT*  Clinical Data: Cardiac  surgery  PORTABLE CHEST - 1 VIEW  Comparison: Yesterday  Findings: The heart is mildly enlarged.  Swan-Ganz catheters been removed with the introducer left in place.  Left chest tube has been removed.  No pneumothorax. Improved basilar atelectasis peri  IMPRESSION: Chest tube removed without pneumothorax.  Improved basilar atelectasis.  Original Report Authenticated By: Jamas Lav, M.D.   Discharge Orders    Future Appointments: Provider: Department: Dept Phone: Center:   08/20/2011 1:30 PM Gaye Pollack, MD [facility] [id number] [facility]   01/01/2012 1:30 PM [facility] [id number] [facility]   01/01/2012 2:45 PM Angelia Mould, MD [facility] [phone] [facility]      Discharge Medications: Current Discharge Medication List    START taking these medications   Details  insulin aspart (NOVOLOG) 100 UNIT/ML injection Inject 6 Units into the skin 3 (three) times daily with meals. Qty: 1 vial, Refills: 1    insulin glargine (LANTUS) 100 UNIT/ML injection Inject 30 Units into the skin every morning. Qty: 10 mL, Refills: 1    oxyCODONE (OXY IR/ROXICODONE) 5 MG immediate release tablet Take 1-2 tablets (5-10 mg total) by mouth every 4 (four) hours as needed for pain. Qty: 45 tablet, Refills: 0      CONTINUE these medications which have NOT CHANGED   Details  acetaminophen (TYLENOL) 500 MG tablet Take 500 mg by mouth every 6 (six) hours as needed. For pain    allopurinol (ZYLOPRIM) 100 MG tablet Take  100 mg by mouth daily.    aspirin 325 MG tablet Take 325 mg by mouth daily.    B Complex Vitamins (VITAMIN B COMPLEX PO) Take 1 tablet by mouth daily.    brimonidine (ALPHAGAN P) 0.1 % SOLN Place 1 drop into the left eye 2 (two) times daily.    Cholecalciferol (VITAMIN D) 2000 UNITS tablet Take 2,000 Units by mouth 2 (two) times daily.    citalopram (CELEXA) 20 MG tablet Take 20 mg by mouth daily.    colchicine 0.6 MG tablet Take 0.6 mg by mouth 2 (two) times daily.  gout    fish oil-omega-3 fatty acids 1000 MG capsule Take 2 g by mouth daily.    metoprolol (TOPROL-XL) 50 MG 24 hr tablet Take 50 mg by mouth daily.    omeprazole (PRILOSEC) 20 MG capsule Take 20 mg by mouth daily as needed. Acid reflux    Linagliptin (Tradjenta) 5 MG tablet Take 5 mg by mouth daily.    timolol (TIMOPTIC) 0.5 % ophthalmic solution Place 1 drop into the left eye daily.    travoprost, benzalkonium, (TRAVATAN) 0.004 % ophthalmic solution Place 1 drop into the left eye at bedtime.      STOP taking these medications     furosemide (LASIX) 20 MG tablet      metFORMIN (GLUCOPHAGE) 1000 MG tablet               sitagliptin (Januvia) 50 MG tablet    Follow Up Appointments: Follow-up Information    Follow up with medical doctor. (Call for follow up appointment for diabetes management)       Follow up with Gaye Pollack, MD. (PA/LAT CXR to be taken on 08/20/2011 at 1:45 pm;Appointment with Dr. Cyndia Bent is on 08/20/2011 at 1:30 pm)    Contact information:   [place] [phone]       Follow up with Jettie Booze., MD. (Call for follow up appointment for 2 weeks)    Contact information:   [address] [place] [phone]          Signed: Lars Pinks MPA-C 08/02/2011, 1:51 PM

## 2011-08-02 NOTE — Progress Notes (Signed)
SUBJECTIVE:  Doing well.  Mild soreness  OBJECTIVE:   Vitals:   Filed Vitals:   08/01/11 0545 08/01/11 1400 08/01/11 2049 08/02/11 0651  BP:  150/80 180/70 178/76  Pulse:  79 85 87  Temp:  98 F (36.7 C) 98.1 F (36.7 C) 98 F (36.7 C)  TempSrc:  Oral Oral Oral  Resp:  18 18 18   Height:      Weight: 96.888 kg (213 lb 9.6 oz)   96.072 kg (211 lb 12.8 oz)  SpO2:  95% 96% 94%   I&O's:   Intake/Output Summary (Last 24 hours) at 08/02/11 [id number] Last data filed at 08/01/11 1500  Gross per 24 hour  Intake    360 ml  Output    301 ml  Net     59 ml   TELEMETRY: Reviewed telemetry pt in NSR     PHYSICAL EXAM General: Well developed, well nourished, in no acute distress Head: Eyes PERRLA, No xanthomas.   Normal cephalic and atramatic  Lungs:   Clear bilaterally to auscultation and percussion. Heart:   HRRR S1 S2  Abdomen:  abdomen soft Msk:  Back normal,  Normal strength and tone for age. Extremities:  tr edema.   Neuro: Alert and oriented X 3. Psych:  Good affect, responds appropriately   LABS: Basic Metabolic Panel:  Basename 08/02/11 0400 08/01/11 0530 07/30/11 1645  NA 141 134* --  K 3.7 3.3* --  CL 103 99 --  CO2 25 23 --  GLUCOSE 163* 278* --  BUN 30* 31* --  CREATININE 1.93* 2.09* --  CALCIUM 8.2* 8.3* --  MG -- -- 2.3  PHOS -- -- --   Liver Function Tests: No results found for this basename: AST:2,ALT:2,ALKPHOS:2,BILITOT:2,PROT:2,ALBUMIN:2 in the last 72 hours No results found for this basename: LIPASE:2,AMYLASE:2 in the last 72 hours CBC:  Basename 08/01/11 0530 07/31/11 0400  WBC 10.0 10.9*  NEUTROABS -- --  HGB 11.1* 10.0*  HCT 33.5* 29.9*  MCV 90.3 89.3  PLT 195 150   Cardiac Enzymes: No results found for this basename: CKTOTAL:3,CKMB:3,CKMBINDEX:3,TROPONINI:3 in the last 72 hours BNP: No components found with this basename: POCBNP:3 D-Dimer: No results found for this basename: DDIMER:2 in the last 72 hours Hemoglobin A1C: No results found  for this basename: HGBA1C in the last 72 hours Fasting Lipid Panel: No results found for this basename: CHOL,HDL,LDLCALC,TRIG,CHOLHDL,LDLDIRECT in the last 72 hours Thyroid Function Tests: No results found for this basename: TSH,T4TOTAL,FREET3,T3FREE,THYROIDAB in the last 72 hours Anemia Panel: No results found for this basename: VITAMINB12,FOLATE,FERRITIN,TIBC,IRON,RETICCTPCT in the last 72 hours Coag Panel:   Lab Results  Component Value Date   INR 1.44 07/29/2011   INR 1.13 07/29/2011   INR 1.0 04/20/2008    RADIOLOGY: Dg Chest Portable 1 View In Am  07/31/2011  *RADIOLOGY REPORT*  Clinical Data: Cardiac surgery  PORTABLE CHEST - 1 VIEW  Comparison: Yesterday  Findings: The heart is mildly enlarged.  Swan-Ganz catheters been removed with the introducer left in place.  Left chest tube has been removed.  No pneumothorax. Improved basilar atelectasis peri  IMPRESSION: Chest tube removed without pneumothorax.  Improved basilar atelectasis.  Original Report Authenticated By: Jamas Lav, M.D.      ASSESSMENT: CAD, HTN, CRI  PLAN:  S/p CABG.  DOing well.  Restarted amlodipine 5 mg daily.  May need to increase to 10 mg daily.  WOuld avoid ACE-I, ARB due to renal insifficiency.  Cr. Improving.  Jettie Booze., MD  08/02/2011  9:37 AM

## 2011-08-03 LAB — GLUCOSE, CAPILLARY
Glucose-Capillary: 171 mg/dL — ABNORMAL HIGH (ref 70–99)
Glucose-Capillary: 108 mg/dL — ABNORMAL HIGH (ref 70–99)

## 2011-08-03 MED ORDER — METOPROLOL TARTRATE 50 MG PO TABS: 50.0000 mg | ORAL_TABLET | Freq: Two times a day (BID) | ORAL | Status: DC

## 2011-08-03 MED ORDER — AMLODIPINE BESYLATE 10 MG PO TABS: 10.0000 mg | ORAL_TABLET | Freq: Every day | ORAL | Status: DC

## 2011-08-03 NOTE — Progress Notes (Signed)
[address]            ,[place] [address]          [phone]     5 Days Post-Op  Procedure(s) (LRB): CORONARY ARTERY BYPASS GRAFTING (CABG) (N/A) Subjective: Feels well, bp eevated  Objective  Telemetry NSR, pac's  Temp:  [97.8 F (36.6 C)-98.5 F (36.9 C)] 97.8 F (36.6 C) (01/19 0405) Pulse Rate:  [73-87] 87  (01/19 0405) Resp:  [18-19] 19  (01/19 0405) BP: (160-189)/(69-85) 189/85 mmHg (01/19 0405) SpO2:  [94 %-95 %] 94 % (01/19 0405) Weight:  [208 lb 11.2 oz (94.666 kg)] 208 lb 11.2 oz (94.666 kg) (01/19 0405)   Intake/Output Summary (Last 24 hours) at 08/03/11 0913 Last data filed at 08/03/11 0803  Gross per 24 hour  Intake    960 ml  Output   2950 ml  Net  -1990 ml       General appearance: alert, cooperative and no distress Heart: regular rate and rhythm and S1, S2 normal Lungs: clear to auscultation bilaterally Abdomen: trace edema Extremities: trace edema Wound: incisions healing well Abdomen- benign exam Lab Results:  Basename 08/02/11 0400 08/01/11 0530  NA 141 134*  K 3.7 3.3*  CL 103 99  CO2 25 23  GLUCOSE 163* 278*  BUN 30* 31*  CREATININE 1.93* 2.09*  CALCIUM 8.2* 8.3*  MG -- --  PHOS -- --   No results found for this basename: AST:2,ALT:2,ALKPHOS:2,BILITOT:2,PROT:2,ALBUMIN:2 in the last 72 hours No results found for this basename: LIPASE:2,AMYLASE:2 in the last 72 hours  Basename 08/01/11 0530  WBC 10.0  NEUTROABS --  HGB 11.1*  HCT 33.5*  MCV 90.3  PLT 195   No results found for this basename: CKTOTAL:4,CKMB:4,TROPONINI:4 in the last 72 hours No components found with this basename: POCBNP:3 No results found for this basename: DDIMER in the last 72 hours No results found for this basename: HGBA1C in the last 72 hours No results found for this basename: CHOL,HDL,LDLCALC,TRIG,CHOLHDL in the last 72 hours No results found for this basename: TSH,T4TOTAL,FREET3,T3FREE,THYROIDAB in the last 72  hours No results found for this basename: VITAMINB12,FOLATE,FERRITIN,TIBC,IRON,RETICCTPCT in the last 72 hours  Medications: Scheduled    . allopurinol  100 mg Oral Daily  . amLODipine  5 mg Oral Daily  . aspirin EC  325 mg Oral Daily   Or  . aspirin  324 mg Per Tube Daily  . bisacodyl  10 mg Oral Daily   Or  . bisacodyl  10 mg Rectal Daily  . brimonidine  1 drop Left Eye BID  . citalopram  20 mg Oral Daily  . colchicine  0.3 mg Oral BID  . docusate sodium  200 mg Oral Daily  . enoxaparin  30 mg Subcutaneous QHS  . insulin aspart  0-24 Units Subcutaneous TID AC & HS  . insulin aspart  6 Units Subcutaneous TID WC  . insulin glargine  30 Units Subcutaneous Q0700  . linagliptin  5 mg Oral Q breakfast  . metoprolol tartrate  25 mg Oral BID  . pantoprazole  40 mg Oral Q1200  . sodium chloride  3 mL Intravenous Q12H  . timolol  1 drop Left Eye Daily  . travoprost (benzalkonium)  1 drop Left Eye QHS     Radiology/Studies:  No results found.  INR: Will add last result for INR, ABG once components are  confirmed Will add last 4 CBG results once components are confirmed  Assessment/Plan: S/P Procedure(s) (LRB): CORONARY ARTERY BYPASS GRAFTING (CABG) (N/A) Plan for discharge: see discharge orders Will increase amlodipine and lopressor dose.  Husband and daughter will assist with insulin    LOS: 10 days    GOLD,[name] 1/19/20139:13 AM

## 2011-08-03 NOTE — Progress Notes (Signed)
Pt. Discharged 08/03/2011  11:15 AM Discharge instructions reviewed with patient/family. Patient/family verbalized understanding. All Rx's given. Questions answered as needed. Pt. Discharged to home with family/self. Taken off unit via W/C. Axel Frisk

## 2011-08-13 ENCOUNTER — Ambulatory Visit (INDEPENDENT_AMBULATORY_CARE_PROVIDER_SITE_OTHER): Payer: Self-pay

## 2011-08-13 VITALS — BP 136/66 | HR 62 | Resp 18

## 2011-08-13 DIAGNOSIS — Z951 Presence of aortocoronary bypass graft: Secondary | ICD-10-CM

## 2011-08-13 DIAGNOSIS — I2510 Atherosclerotic heart disease of native coronary artery without angina pectoris: Secondary | ICD-10-CM

## 2011-08-13 DIAGNOSIS — Z4802 Encounter for removal of sutures: Secondary | ICD-10-CM

## 2011-08-13 NOTE — Patient Instructions (Signed)
F/U appt with Dr Cyndia Bent 08/20/11 @ 1330 with CXR prior.

## 2011-08-13 NOTE — Progress Notes (Signed)
Removed 3 sutures from chest tube sites, no signs of infection and Pt tolerated well. F/U appt with CXR scheduled 08/20/11 with Dr Cyndia Bent. Pt aware

## 2011-08-19 ENCOUNTER — Other Ambulatory Visit: Payer: Self-pay | Admitting: Surgery

## 2011-08-19 DIAGNOSIS — I2510 Atherosclerotic heart disease of native coronary artery without angina pectoris: Secondary | ICD-10-CM

## 2011-08-19 MED FILL — Mannitol IV Soln 20%: INTRAVENOUS | Qty: 500 | Status: AC

## 2011-08-19 MED FILL — Heparin Sodium (Porcine) Inj 1000 Unit/ML: INTRAMUSCULAR | Qty: 10 | Status: AC

## 2011-08-19 MED FILL — Sodium Chloride Irrigation Soln 0.9%: Qty: 3000 | Status: AC

## 2011-08-19 MED FILL — Heparin Sodium (Porcine) Inj 1000 Unit/ML: INTRAMUSCULAR | Qty: 30 | Status: AC

## 2011-08-19 MED FILL — Sodium Bicarbonate IV Soln 8.4%: INTRAVENOUS | Qty: 50 | Status: AC

## 2011-08-19 MED FILL — Lidocaine HCl IV Inj 20 MG/ML: INTRAVENOUS | Qty: 10 | Status: AC

## 2011-08-19 MED FILL — Sodium Chloride IV Soln 0.9%: INTRAVENOUS | Qty: 1000 | Status: AC

## 2011-08-19 MED FILL — Electrolyte-R (PH 7.4) Solution: INTRAVENOUS | Qty: 6000 | Status: AC

## 2011-08-20 ENCOUNTER — Ambulatory Visit: Payer: Medicare Other | Admitting: Surgery

## 2011-08-20 DIAGNOSIS — I2510 Atherosclerotic heart disease of native coronary artery without angina pectoris: Secondary | ICD-10-CM | POA: Diagnosis not present

## 2011-08-20 DIAGNOSIS — I10 Essential (primary) hypertension: Secondary | ICD-10-CM | POA: Diagnosis not present

## 2011-08-20 DIAGNOSIS — N189 Chronic kidney disease, unspecified: Secondary | ICD-10-CM | POA: Diagnosis not present

## 2011-08-22 ENCOUNTER — Other Ambulatory Visit: Payer: Self-pay | Admitting: Surgery

## 2011-08-22 ENCOUNTER — Other Ambulatory Visit: Payer: Self-pay | Admitting: Cardiothoracic Surgery

## 2011-08-22 DIAGNOSIS — Z09 Encounter for follow-up examination after completed treatment for conditions other than malignant neoplasm: Secondary | ICD-10-CM | POA: Diagnosis not present

## 2011-08-22 DIAGNOSIS — I2510 Atherosclerotic heart disease of native coronary artery without angina pectoris: Secondary | ICD-10-CM

## 2011-08-27 ENCOUNTER — Encounter: Payer: Self-pay | Admitting: Surgery

## 2011-08-27 ENCOUNTER — Ambulatory Visit (INDEPENDENT_AMBULATORY_CARE_PROVIDER_SITE_OTHER): Payer: Self-pay | Admitting: Surgery

## 2011-08-27 ENCOUNTER — Ambulatory Visit
Admission: RE | Admit: 2011-08-27 | Discharge: 2011-08-27 | Disposition: A | Discharge status: Home / Self Care | Payer: Medicare Other | Source: Ambulatory Visit | Attending: Surgery | Admitting: Surgery

## 2011-08-27 VITALS — BP 133/67 | HR 66 | Resp 16 | Ht 63.5 in | Wt 194.0 lb

## 2011-08-27 DIAGNOSIS — Z09 Encounter for follow-up examination after completed treatment for conditions other than malignant neoplasm: Secondary | ICD-10-CM

## 2011-08-27 DIAGNOSIS — I2510 Atherosclerotic heart disease of native coronary artery without angina pectoris: Secondary | ICD-10-CM

## 2011-08-27 NOTE — Progress Notes (Signed)
[address]            [place] [address]          [phone]     HPI:  Patient returns for routine postoperative follow-up having undergone coronary bypass graft surgery x3 on 07/24/2011. The patient's early postoperative recovery while in the hospital was notable for an uncomplicated postoperative course.. Since hospital discharge the patient reports she has felt much better than she did before surgery. Her stamina is continuing to improve. She is walking without chest pain or shortness of breath.   Current Outpatient Prescriptions  Medication Sig Dispense Refill  . acetaminophen (TYLENOL) 500 MG tablet Take 500 mg by mouth every 6 (six) hours as needed. For pain      . allopurinol (ZYLOPRIM) 100 MG tablet Take 100 mg by mouth daily.      Marland Kitchen amLODipine (NORVASC) 10 MG tablet Take 1 tablet (10 mg total) by mouth daily.  30 tablet  1  . aspirin 325 MG tablet Take 325 mg by mouth daily.      . B Complex Vitamins (VITAMIN B COMPLEX PO) Take 1 tablet by mouth daily.      . brimonidine (ALPHAGAN P) 0.1 % SOLN Place 1 drop into the left eye 2 (two) times daily.      . Cholecalciferol (VITAMIN D) 2000 UNITS tablet Take 2,000 Units by mouth 2 (two) times daily.      . citalopram (CELEXA) 20 MG tablet Take 20 mg by mouth daily.      . fish oil-omega-3 fatty acids 1000 MG capsule Take 2 g by mouth daily.      . insulin aspart (NOVOLOG) 100 UNIT/ML injection Inject 6 Units into the skin 3 (three) times daily with meals.  1 vial  1  . insulin glargine (LANTUS) 100 UNIT/ML injection Inject 30 Units into the skin every morning.  10 mL  1  . linagliptin (TRADJENTA) 5 MG TABS tablet Take 1 tablet (5 mg total) by mouth daily with breakfast.  30 tablet  1  . metoprolol (LOPRESSOR) 50 MG tablet Take 1 tablet (50 mg total) by mouth 2 (two) times daily.  60 tablet  1  . timolol (TIMOPTIC) 0.5 % ophthalmic solution Place 1 drop into the left eye daily.      . travoprost,  benzalkonium, (TRAVATAN) 0.004 % ophthalmic solution Place 1 drop into the left eye at bedtime.      . colchicine 0.6 MG tablet Take 0.6 mg by mouth 2 (two) times daily. gout      . omeprazole (PRILOSEC) 20 MG capsule Take 20 mg by mouth daily as needed. Acid reflux        Physical Exam: BP 133/67  Pulse 66  Resp 16  Ht 5' 3.5" (1.613 m)  Wt 194 lb (87.998 kg)  BMI 33.83 kg/m2  SpO2 96% She looks well. Cardiac exam shows a regular rate and rhythm with normal heart sounds. Lung exam is clear. Chest incision is healing well and sternum is stable. Her leg incision is healing well and there is no peripheral edema.  Diagnostic Tests:  Chest x-ray today shows clear lung fields and no pleural effusions.  Impression:  [name] is recovering well following her surgery. I encouraged her to continue walking as much as possible. I told her she could return to driving a car but should refrain  from lifting anything heavier than 10 pounds for a total of 3 months and date of surgery.  Plan:  She will followup with cardiology in 1 month and will return to see me if she develops any problems with her incisions.

## 2011-09-02 DIAGNOSIS — E559 Vitamin D deficiency, unspecified: Secondary | ICD-10-CM | POA: Diagnosis not present

## 2011-09-02 DIAGNOSIS — E538 Deficiency of other specified B group vitamins: Secondary | ICD-10-CM | POA: Diagnosis not present

## 2011-09-02 DIAGNOSIS — G609 Hereditary and idiopathic neuropathy, unspecified: Secondary | ICD-10-CM | POA: Diagnosis not present

## 2011-09-02 DIAGNOSIS — I10 Essential (primary) hypertension: Secondary | ICD-10-CM | POA: Diagnosis not present

## 2011-09-04 DIAGNOSIS — M109 Gout, unspecified: Secondary | ICD-10-CM | POA: Diagnosis not present

## 2011-09-04 DIAGNOSIS — N2581 Secondary hyperparathyroidism of renal origin: Secondary | ICD-10-CM | POA: Diagnosis not present

## 2011-09-09 DIAGNOSIS — H409 Unspecified glaucoma: Secondary | ICD-10-CM | POA: Diagnosis not present

## 2011-09-09 DIAGNOSIS — H4011X0 Primary open-angle glaucoma, stage unspecified: Secondary | ICD-10-CM | POA: Diagnosis not present

## 2011-09-24 DIAGNOSIS — E1149 Type 2 diabetes mellitus with other diabetic neurological complication: Secondary | ICD-10-CM | POA: Diagnosis not present

## 2011-09-24 DIAGNOSIS — E1349 Other specified diabetes mellitus with other diabetic neurological complication: Secondary | ICD-10-CM | POA: Diagnosis not present

## 2011-09-29 ENCOUNTER — Other Ambulatory Visit: Payer: Self-pay | Admitting: Physician Assistant

## 2011-09-30 DIAGNOSIS — E1139 Type 2 diabetes mellitus with other diabetic ophthalmic complication: Secondary | ICD-10-CM | POA: Diagnosis not present

## 2011-09-30 DIAGNOSIS — E11349 Type 2 diabetes mellitus with severe nonproliferative diabetic retinopathy without macular edema: Secondary | ICD-10-CM | POA: Diagnosis not present

## 2011-09-30 DIAGNOSIS — E11359 Type 2 diabetes mellitus with proliferative diabetic retinopathy without macular edema: Secondary | ICD-10-CM | POA: Diagnosis not present

## 2011-09-30 DIAGNOSIS — E11311 Type 2 diabetes mellitus with unspecified diabetic retinopathy with macular edema: Secondary | ICD-10-CM | POA: Diagnosis not present

## 2011-09-30 DIAGNOSIS — H35049 Retinal micro-aneurysms, unspecified, unspecified eye: Secondary | ICD-10-CM | POA: Diagnosis not present

## 2011-10-07 DIAGNOSIS — H4011X0 Primary open-angle glaucoma, stage unspecified: Secondary | ICD-10-CM | POA: Diagnosis not present

## 2011-10-07 DIAGNOSIS — H409 Unspecified glaucoma: Secondary | ICD-10-CM | POA: Diagnosis not present

## 2011-10-10 DIAGNOSIS — M76899 Other specified enthesopathies of unspecified lower limb, excluding foot: Secondary | ICD-10-CM | POA: Diagnosis not present

## 2011-10-10 DIAGNOSIS — M545 Low back pain: Secondary | ICD-10-CM | POA: Diagnosis not present

## 2011-10-22 DIAGNOSIS — I129 Hypertensive chronic kidney disease with stage 1 through stage 4 chronic kidney disease, or unspecified chronic kidney disease: Secondary | ICD-10-CM | POA: Diagnosis not present

## 2011-10-22 DIAGNOSIS — I10 Essential (primary) hypertension: Secondary | ICD-10-CM | POA: Diagnosis not present

## 2011-10-22 DIAGNOSIS — E119 Type 2 diabetes mellitus without complications: Secondary | ICD-10-CM | POA: Diagnosis not present

## 2011-10-23 DIAGNOSIS — E1149 Type 2 diabetes mellitus with other diabetic neurological complication: Secondary | ICD-10-CM | POA: Diagnosis not present

## 2011-10-23 DIAGNOSIS — H409 Unspecified glaucoma: Secondary | ICD-10-CM | POA: Diagnosis not present

## 2011-10-23 DIAGNOSIS — E538 Deficiency of other specified B group vitamins: Secondary | ICD-10-CM | POA: Diagnosis not present

## 2011-10-23 DIAGNOSIS — E559 Vitamin D deficiency, unspecified: Secondary | ICD-10-CM | POA: Diagnosis not present

## 2011-10-23 DIAGNOSIS — I10 Essential (primary) hypertension: Secondary | ICD-10-CM | POA: Diagnosis not present

## 2011-10-23 DIAGNOSIS — E1349 Other specified diabetes mellitus with other diabetic neurological complication: Secondary | ICD-10-CM | POA: Diagnosis not present

## 2011-10-23 DIAGNOSIS — E782 Mixed hyperlipidemia: Secondary | ICD-10-CM | POA: Diagnosis not present

## 2011-10-28 DIAGNOSIS — L680 Hirsutism: Secondary | ICD-10-CM | POA: Diagnosis not present

## 2011-10-28 DIAGNOSIS — L570 Actinic keratosis: Secondary | ICD-10-CM | POA: Diagnosis not present

## 2011-10-28 DIAGNOSIS — Z85828 Personal history of other malignant neoplasm of skin: Secondary | ICD-10-CM | POA: Diagnosis not present

## 2011-11-15 DIAGNOSIS — I10 Essential (primary) hypertension: Secondary | ICD-10-CM | POA: Diagnosis not present

## 2011-11-15 DIAGNOSIS — I2510 Atherosclerotic heart disease of native coronary artery without angina pectoris: Secondary | ICD-10-CM | POA: Diagnosis not present

## 2011-12-26 DIAGNOSIS — N184 Chronic kidney disease, stage 4 (severe): Secondary | ICD-10-CM | POA: Diagnosis not present

## 2011-12-30 DIAGNOSIS — H4011X0 Primary open-angle glaucoma, stage unspecified: Secondary | ICD-10-CM | POA: Diagnosis not present

## 2011-12-30 DIAGNOSIS — H409 Unspecified glaucoma: Secondary | ICD-10-CM | POA: Diagnosis not present

## 2011-12-31 ENCOUNTER — Encounter: Payer: Self-pay | Admitting: Vascular Surgery

## 2012-01-01 ENCOUNTER — Other Ambulatory Visit (INDEPENDENT_AMBULATORY_CARE_PROVIDER_SITE_OTHER): Payer: Medicare Other | Admitting: *Deleted

## 2012-01-01 ENCOUNTER — Encounter: Payer: Self-pay | Admitting: Vascular Surgery

## 2012-01-01 ENCOUNTER — Ambulatory Visit (INDEPENDENT_AMBULATORY_CARE_PROVIDER_SITE_OTHER): Payer: Medicare Other | Admitting: Vascular Surgery

## 2012-01-01 VITALS — BP 120/74 | HR 61 | Temp 98.1°F | Resp 12 | Ht 64.5 in | Wt 195.0 lb

## 2012-01-01 DIAGNOSIS — I6529 Occlusion and stenosis of unspecified carotid artery: Secondary | ICD-10-CM

## 2012-01-01 DIAGNOSIS — Z48812 Encounter for surgical aftercare following surgery on the circulatory system: Secondary | ICD-10-CM

## 2012-01-01 NOTE — Progress Notes (Signed)
[facility]  Patient name: [name] MRN: [id number] DOB: 1941/07/07 Sex: female  REASON FOR VISIT: follow up of carotid disease  HPI: [name] is a 71 y.o. female who underwent a left carotid endarterectomy in 2009. She comes in for a routine follow up carotid duplex scan. Since I saw her last she's undergone coronary revascularization and has done very well from that standpoint. This was in January of this year. She's had no history of stroke, TIAs, expressive or receptive aphasia, or amaurosis fugax.   REVIEW OF SYSTEMS: Valu.Nieves ] denotes positive finding; [  ] denotes negative finding  CARDIOVASCULAR:  [ ]  chest pain   [ ]  dyspnea on exertion    CONSTITUTIONAL:  [ ]  fever   [ ]  chills  PHYSICAL EXAM: Filed Vitals:   01/01/12 1459 01/01/12 1508  BP: 129/74 120/74  Pulse: 60 61  Temp: 98.1 F (36.7 C)   TempSrc: Oral   Resp:  12  Height: 5' 4.5" (1.638 m)   Weight: 195 lb (88.451 kg)   SpO2: 98%    Body mass index is 32.95 kg/(m^2). GENERAL: The patient is a well-nourished female, in no acute distress. The vital signs are documented above. CARDIOVASCULAR: There is a regular rate and rhythm. I do not detect carotid bruits.  PULMONARY: There is good air exchange bilaterally without wheezing or rales. NEURO: he has no focal weakness or paresthesias.  I have independently interpreted her carotid duplex scan which shows no evidence of recurrent carotid stenosis on the left. He is a 40-59% right carotid stenosis. The velocities on the right adnexa improved considerably since November of 2011.  MEDICAL ISSUES:  Occlusion and stenosis of carotid artery without mention of cerebral infarction This patient is a previous left carotid endarterectomy in 2009 in the left carotid endarterectomy site is widely patent. Is no evidence of restenosis. On the right side she has a 40-59% carotid stenosis. The velocities on the right had actually improved compared  to November of 2011. She remains asymptomatic. I've ordered a follow up carotid duplex scan in 6 months. It is no change at that time that think we can stretch her follow up out to yearly intervals. I'll plan on seeing her back in 18 months and last is any change in her follow up carotid duplex scans before then. Does know to continue taking her aspirin.   [place] [facility] Beeper: [phone]

## 2012-01-01 NOTE — Assessment & Plan Note (Signed)
This patient is a previous left carotid endarterectomy in 2009 in the left carotid endarterectomy site is widely patent. Is no evidence of restenosis. On the right side she has a 40-59% carotid stenosis. The velocities on the right had actually improved compared to November of 2011. She remains asymptomatic. I've ordered a follow up carotid duplex scan in 6 months. It is no change at that time that think we can stretch her follow up out to yearly intervals. I'll plan on seeing her back in 18 months and last is any change in her follow up carotid duplex scans before then. Does know to continue taking her aspirin.

## 2012-01-08 NOTE — Procedures (Unsigned)
CAROTID DUPLEX EXAM  INDICATION:  Followup left carotid endarterectomy.  HISTORY: Diabetes:  Yes Cardiac:  CABG x4, January 2013 Hypertension:  Yes Smoking:  No Previous Surgery:  Left CEA, 04/27/2008 CV History:  Currently asymptomatic Amaurosis Fugax No, Paresthesias No, Hemiparesis No                                      RIGHT             LEFT Brachial systolic pressure:         130               130 Brachial Doppler waveforms:         Normal            Normal Vertebral direction of flow:        Antegrade         Antegrade DUPLEX VELOCITIES (cm/sec) CCA peak systolic                   72                69 ECA peak systolic                   265               99 ICA peak systolic                   207               96 ICA end diastolic                   49                30 PLAQUE MORPHOLOGY:                  Mixed PLAQUE AMOUNT:                      Moderate          None PLAQUE LOCATION:                    ICA, ECA, CCA  IMPRESSION: 1. Right ICA velocities suggest 40 to 59% stenosis. 2. Patent left CEA site with no evidence of restenosis of the ICA. 3. No significant change when compared to the previous exam on     06/17/2011.  ___________________________________________ Judeth Cornfield. Scot Dock, M.D.  EM/MEDQ  D:  01/01/2012  T:  01/01/2012  Job:  [id number]

## 2012-01-13 DIAGNOSIS — N184 Chronic kidney disease, stage 4 (severe): Secondary | ICD-10-CM | POA: Diagnosis not present

## 2012-01-28 DIAGNOSIS — Z1331 Encounter for screening for depression: Secondary | ICD-10-CM | POA: Diagnosis not present

## 2012-01-28 DIAGNOSIS — Z79899 Other long term (current) drug therapy: Secondary | ICD-10-CM | POA: Diagnosis not present

## 2012-01-28 DIAGNOSIS — E538 Deficiency of other specified B group vitamins: Secondary | ICD-10-CM | POA: Diagnosis not present

## 2012-01-28 DIAGNOSIS — E1149 Type 2 diabetes mellitus with other diabetic neurological complication: Secondary | ICD-10-CM | POA: Diagnosis not present

## 2012-01-28 DIAGNOSIS — E119 Type 2 diabetes mellitus without complications: Secondary | ICD-10-CM | POA: Diagnosis not present

## 2012-01-28 DIAGNOSIS — E1349 Other specified diabetes mellitus with other diabetic neurological complication: Secondary | ICD-10-CM | POA: Diagnosis not present

## 2012-01-28 DIAGNOSIS — E559 Vitamin D deficiency, unspecified: Secondary | ICD-10-CM | POA: Diagnosis not present

## 2012-01-28 DIAGNOSIS — E782 Mixed hyperlipidemia: Secondary | ICD-10-CM | POA: Diagnosis not present

## 2012-03-27 DIAGNOSIS — N184 Chronic kidney disease, stage 4 (severe): Secondary | ICD-10-CM | POA: Diagnosis not present

## 2012-03-30 DIAGNOSIS — H409 Unspecified glaucoma: Secondary | ICD-10-CM | POA: Diagnosis not present

## 2012-03-30 DIAGNOSIS — H4011X0 Primary open-angle glaucoma, stage unspecified: Secondary | ICD-10-CM | POA: Diagnosis not present

## 2012-03-30 DIAGNOSIS — E119 Type 2 diabetes mellitus without complications: Secondary | ICD-10-CM | POA: Diagnosis not present

## 2012-03-30 DIAGNOSIS — Z794 Long term (current) use of insulin: Secondary | ICD-10-CM | POA: Diagnosis not present

## 2012-03-31 DIAGNOSIS — E1139 Type 2 diabetes mellitus with other diabetic ophthalmic complication: Secondary | ICD-10-CM | POA: Diagnosis not present

## 2012-03-31 DIAGNOSIS — E11311 Type 2 diabetes mellitus with unspecified diabetic retinopathy with macular edema: Secondary | ICD-10-CM | POA: Diagnosis not present

## 2012-03-31 DIAGNOSIS — E11359 Type 2 diabetes mellitus with proliferative diabetic retinopathy without macular edema: Secondary | ICD-10-CM | POA: Diagnosis not present

## 2012-03-31 DIAGNOSIS — E11349 Type 2 diabetes mellitus with severe nonproliferative diabetic retinopathy without macular edema: Secondary | ICD-10-CM | POA: Diagnosis not present

## 2012-04-09 DIAGNOSIS — M25569 Pain in unspecified knee: Secondary | ICD-10-CM | POA: Diagnosis not present

## 2012-04-09 DIAGNOSIS — M1710 Unilateral primary osteoarthritis, unspecified knee: Secondary | ICD-10-CM | POA: Diagnosis not present

## 2012-04-26 IMAGING — CR DG CHEST 2V
2 series · 2 of 2 positions shown · non-contrast
Comparison: 07/31/2011.

CLINICAL DATA: Bypass surgery follow-up.

CHEST - 2 VIEW

[w chest pa]
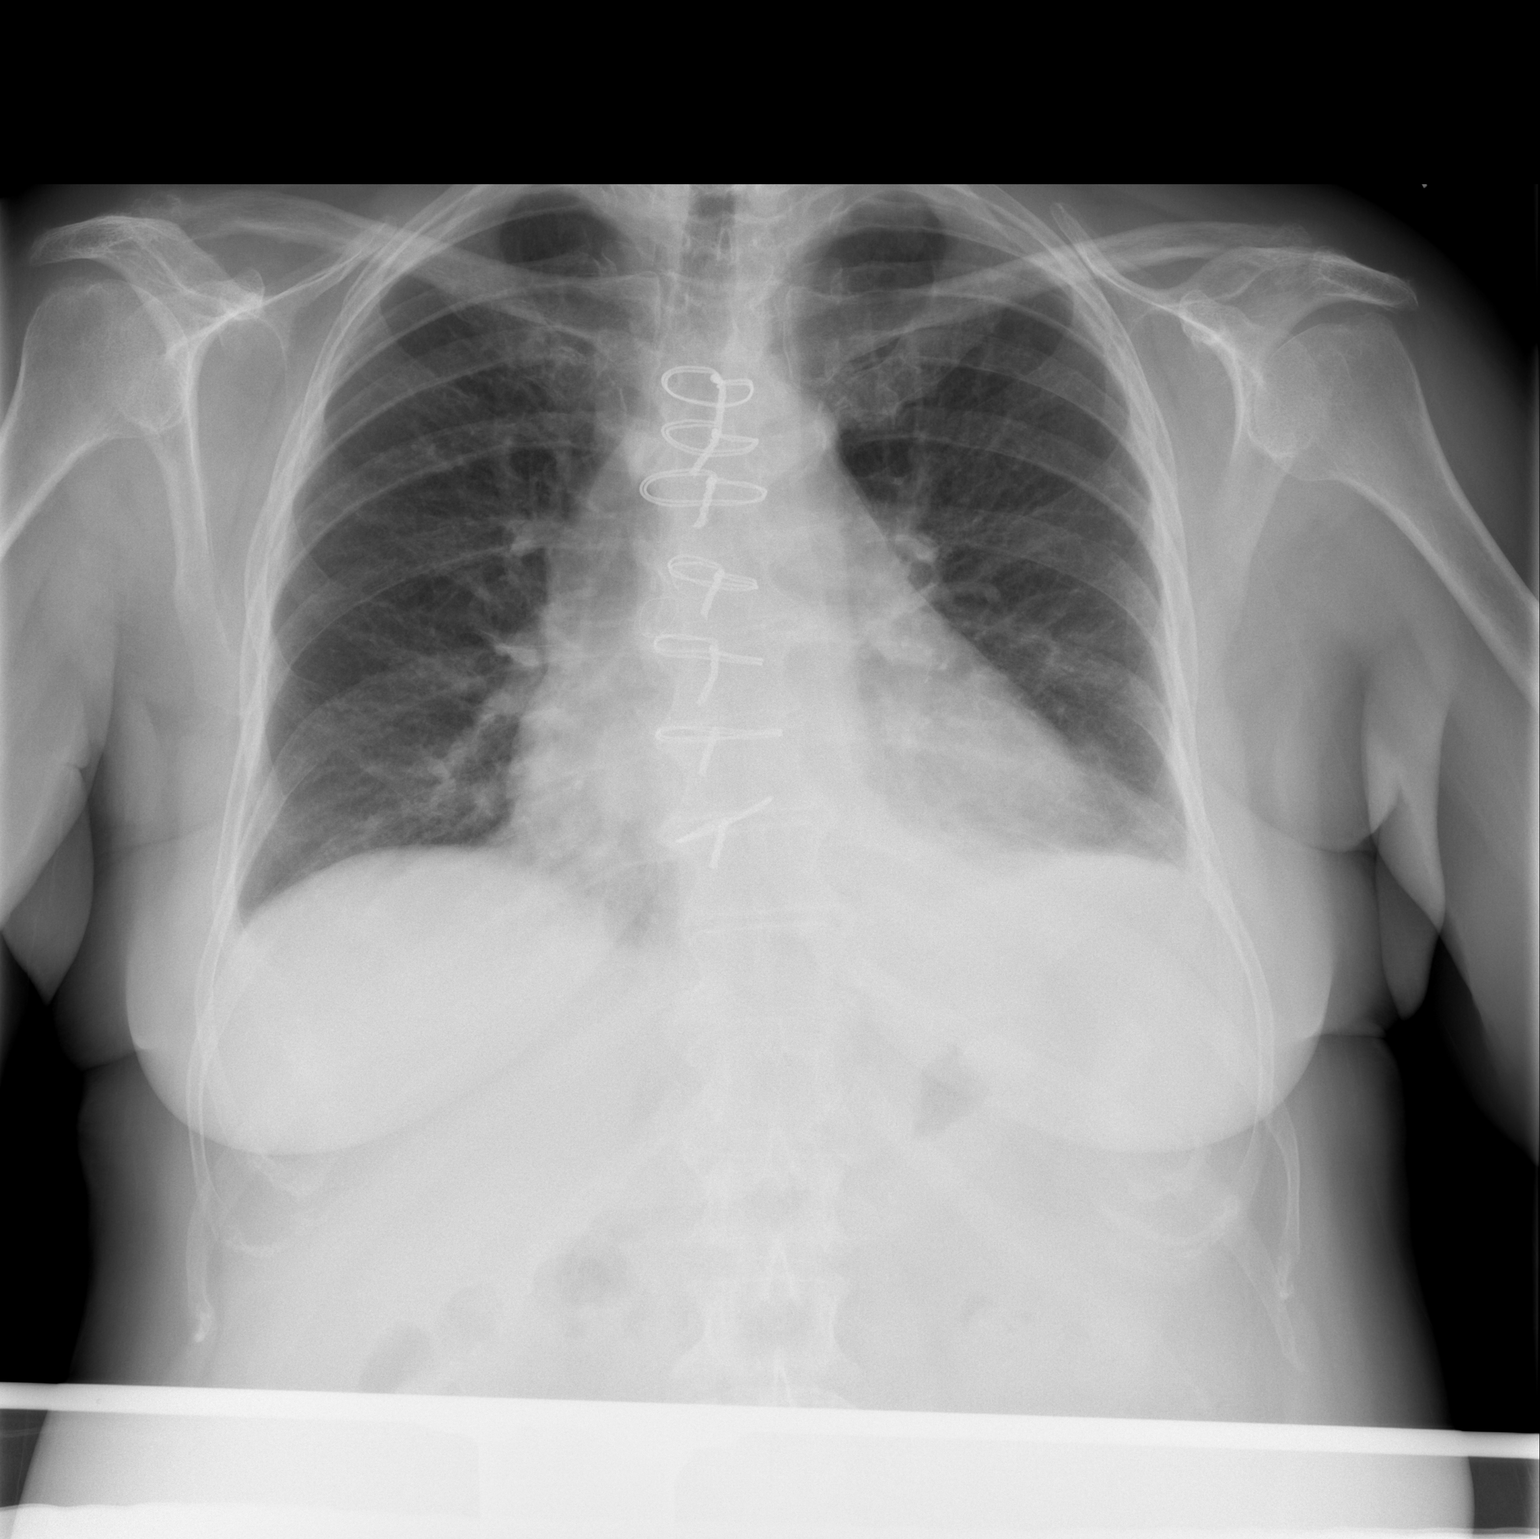

[w chest lat]
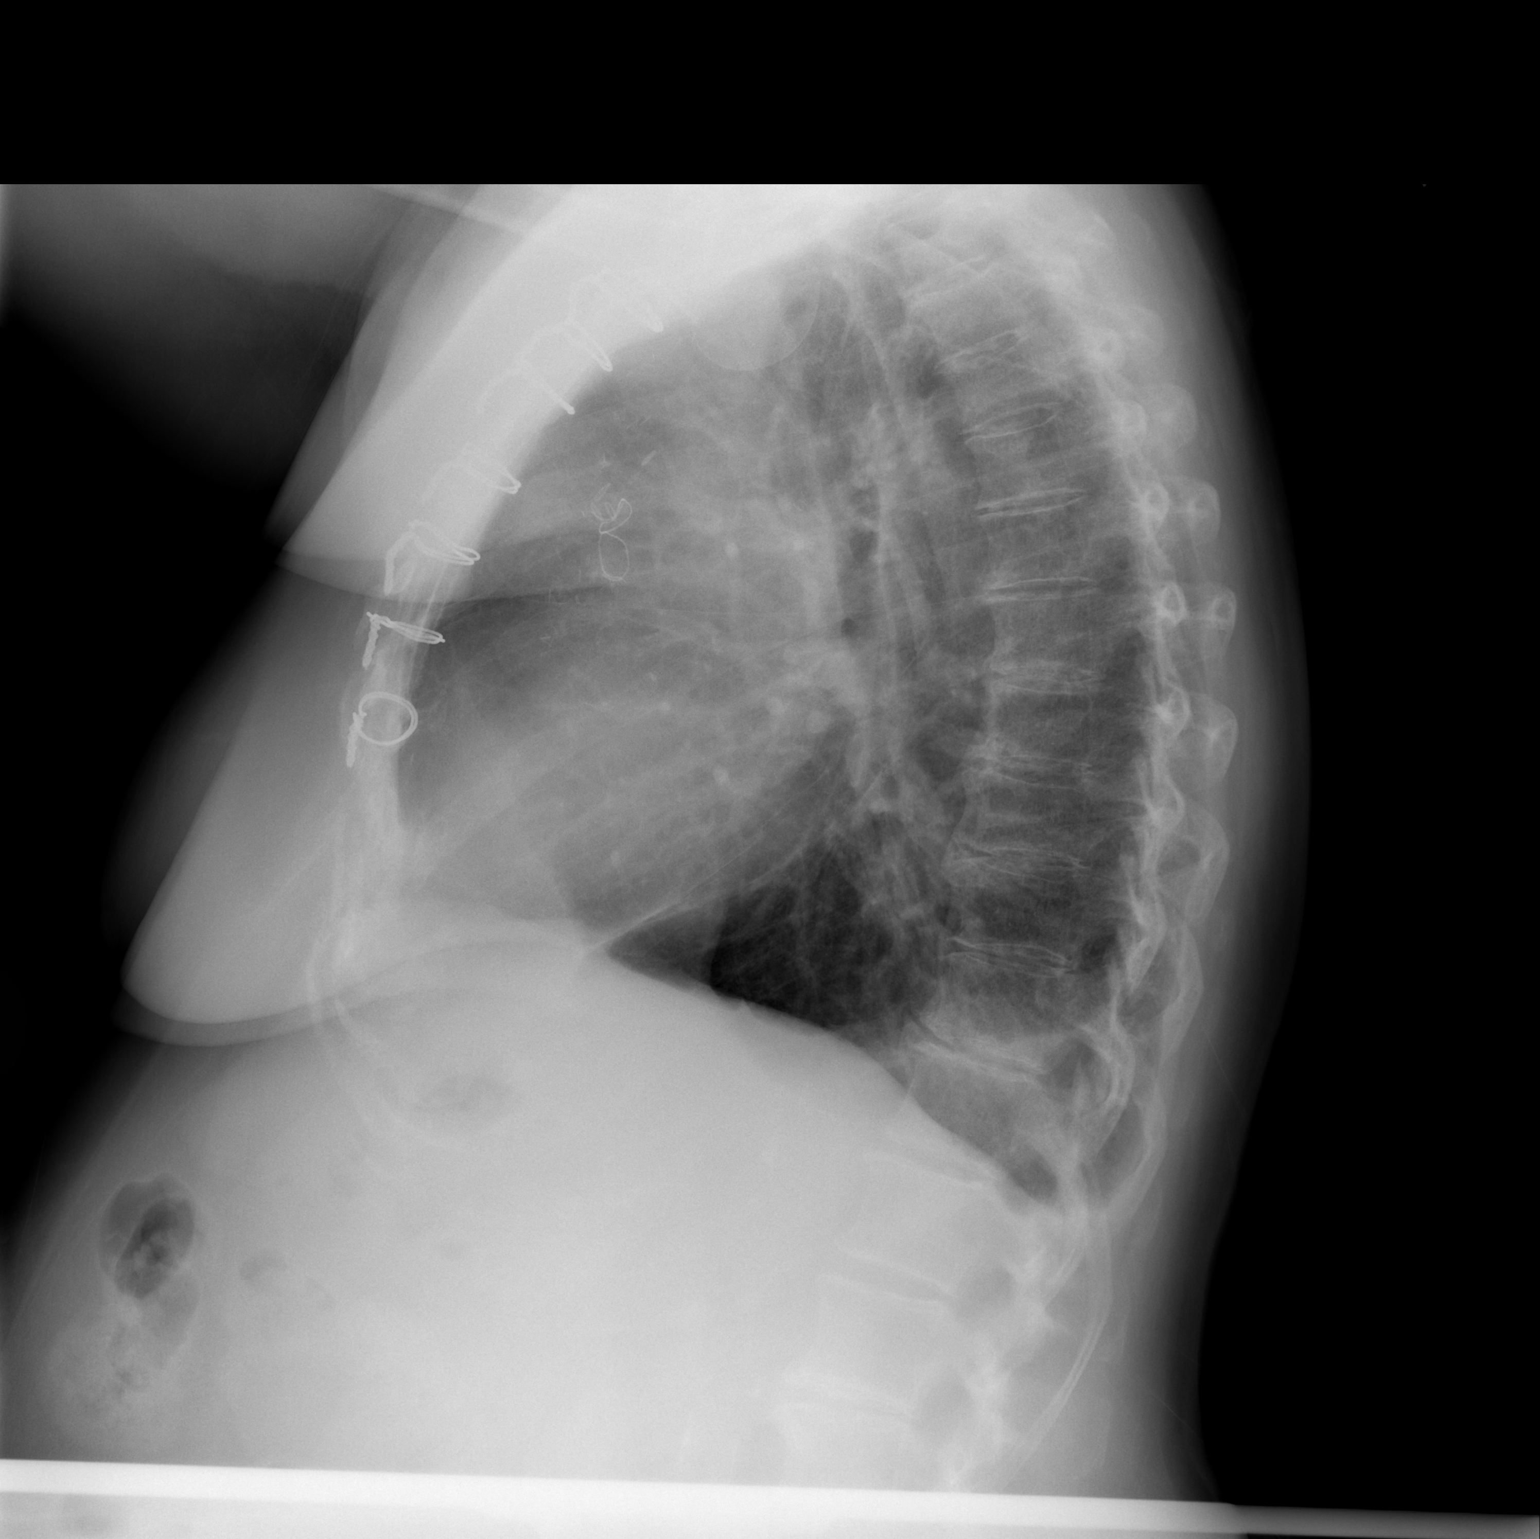

[2 of 2 positions shown; findings below may reference images not displayed]

FINDINGS: Stable surgical changes related to bypass surgery.  The
heart is borderline enlarged but stable.  The lungs are clear.  No
pleural effusion.  The bony thorax is intact.
IMPRESSION: No acute cardiopulmonary findings.

## 2012-05-28 DIAGNOSIS — I2510 Atherosclerotic heart disease of native coronary artery without angina pectoris: Secondary | ICD-10-CM | POA: Diagnosis not present

## 2012-05-28 DIAGNOSIS — I10 Essential (primary) hypertension: Secondary | ICD-10-CM | POA: Diagnosis not present

## 2012-05-28 DIAGNOSIS — E782 Mixed hyperlipidemia: Secondary | ICD-10-CM | POA: Diagnosis not present

## 2012-06-29 DIAGNOSIS — Z23 Encounter for immunization: Secondary | ICD-10-CM | POA: Diagnosis not present

## 2012-06-29 DIAGNOSIS — I129 Hypertensive chronic kidney disease with stage 1 through stage 4 chronic kidney disease, or unspecified chronic kidney disease: Secondary | ICD-10-CM | POA: Diagnosis not present

## 2012-06-29 DIAGNOSIS — E119 Type 2 diabetes mellitus without complications: Secondary | ICD-10-CM | POA: Diagnosis not present

## 2012-06-29 DIAGNOSIS — H409 Unspecified glaucoma: Secondary | ICD-10-CM | POA: Diagnosis not present

## 2012-06-29 DIAGNOSIS — N184 Chronic kidney disease, stage 4 (severe): Secondary | ICD-10-CM | POA: Diagnosis not present

## 2012-06-29 DIAGNOSIS — I10 Essential (primary) hypertension: Secondary | ICD-10-CM | POA: Diagnosis not present

## 2012-06-29 DIAGNOSIS — N2581 Secondary hyperparathyroidism of renal origin: Secondary | ICD-10-CM | POA: Diagnosis not present

## 2012-06-29 DIAGNOSIS — H4011X0 Primary open-angle glaucoma, stage unspecified: Secondary | ICD-10-CM | POA: Diagnosis not present

## 2012-06-29 DIAGNOSIS — D649 Anemia, unspecified: Secondary | ICD-10-CM | POA: Diagnosis not present

## 2012-06-30 ENCOUNTER — Encounter: Payer: Self-pay | Admitting: Neurosurgery

## 2012-07-01 ENCOUNTER — Other Ambulatory Visit (INDEPENDENT_AMBULATORY_CARE_PROVIDER_SITE_OTHER): Payer: Medicare Other | Admitting: *Deleted

## 2012-07-01 ENCOUNTER — Ambulatory Visit (INDEPENDENT_AMBULATORY_CARE_PROVIDER_SITE_OTHER): Payer: Medicare Other | Admitting: Neurosurgery

## 2012-07-01 ENCOUNTER — Encounter: Payer: Self-pay | Admitting: Neurosurgery

## 2012-07-01 VITALS — BP 120/53 | HR 57 | Resp 18 | Ht 65.0 in | Wt 206.0 lb

## 2012-07-01 DIAGNOSIS — Z48812 Encounter for surgical aftercare following surgery on the circulatory system: Secondary | ICD-10-CM | POA: Diagnosis not present

## 2012-07-01 DIAGNOSIS — I6529 Occlusion and stenosis of unspecified carotid artery: Secondary | ICD-10-CM

## 2012-07-01 NOTE — Progress Notes (Signed)
[facility] Carotid Office Note  CC: Carotid surveillance Referring Physician: Scot Dock  History of Present Illness: 71 year old female patient of Dr. Scot Dock status post left CEA in 2009. The patient denies any signs or symptoms of CVA, TIA, amaurosis fugax or any neural deficit. The patient denies any new medical diagnoses recent surgery.  Past Medical History  Diagnosis Date  . Shortness of breath   . Anemia   . Glaucoma(365)   . Hypertension   . Stroke     hx of TIA  . Blood transfusion   . GERD (gastroesophageal reflux disease)   . Arthritis   . Neuromuscular disorder     CARPEL TUNNEL  . Carotid artery occlusion     ROS: [x]  Positive   [ ]  Denies    General: [ ]  Weight loss, [ ]  Fever, [ ]  chills Neurologic: [ ]  Dizziness, [ ]  Blackouts, [ ]  Seizure [ ]  Stroke, [ ]  "Mini stroke", [ ]  Slurred speech, [ ]  Temporary blindness; [ ]  weakness in arms or legs, [ ]  Hoarseness Cardiac: [ ]  Chest pain/pressure, [ ]  Shortness of breath at rest [ ]  Shortness of breath with exertion, [ ]  Atrial fibrillation or irregular heartbeat Vascular: [ ]  Pain in legs with walking, [ ]  Pain in legs at rest, [ ]  Pain in legs at night,  [ ]  Non-healing ulcer, [ ]  Blood clot in vein/DVT,   Pulmonary: [ ]  Home oxygen, [ ]  Productive cough, [ ]  Coughing up blood, [ ]  Asthma,  [ ]  Wheezing Musculoskeletal:  [ ]  Arthritis, [ ]  Low back pain, [ ]  Joint pain Hematologic: [ ]  Easy Bruising, [ ]  Anemia; [ ]  Hepatitis Gastrointestinal: [ ]  Blood in stool, [ ]  Gastroesophageal Reflux/heartburn, [ ]  Trouble swallowing Urinary: [ ]  chronic Kidney disease, [ ]  on HD - [ ]  MWF or [ ]  TTHS, [ ]  Burning with urination, [ ]  Difficulty urinating Skin: [ ]  Rashes, [ ]  Wounds Psychological: [ ]  Anxiety, [ ]  Depression   Social History History  Substance Use Topics  . Smoking status: Never Smoker   . Smokeless tobacco: Never Used  . Alcohol Use: No    Family History History  reviewed. No pertinent family history.  No Known Allergies  Current Outpatient Prescriptions  Medication Sig Dispense Refill  . acetaminophen (TYLENOL) 500 MG tablet Take 500 mg by mouth every 6 (six) hours as needed. For pain      . allopurinol (ZYLOPRIM) 100 MG tablet Take 100 mg by mouth daily.      Marland Kitchen amLODipine (NORVASC) 10 MG tablet Take 1 tablet (10 mg total) by mouth daily.  30 tablet  1  . amLODipine (NORVASC) 5 MG tablet Take 5 mg by mouth daily.      Marland Kitchen aspirin 325 MG tablet Take 325 mg by mouth daily.      Marland Kitchen atorvastatin (LIPITOR) 10 MG tablet Take 10 mg by mouth daily.      . B Complex Vitamins (VITAMIN B COMPLEX PO) Take 1 tablet by mouth daily.      . brimonidine (ALPHAGAN P) 0.1 % SOLN Place 1 drop into the left eye 2 (two) times daily.      . Cholecalciferol (VITAMIN D) 2000 UNITS tablet Take 2,000 Units by mouth 2 (two) times daily.      . citalopram (CELEXA) 20 MG tablet Take 20 mg by mouth daily.      . colchicine 0.6 MG tablet Take 0.6 mg  by mouth 2 (two) times daily. gout      . fish oil-omega-3 fatty acids 1000 MG capsule Take 2 g by mouth daily.      . furosemide (LASIX) 40 MG tablet Take 40 mg by mouth 2 (two) times daily.      . insulin aspart (NOVOLOG) 100 UNIT/ML injection Inject 6 Units into the skin 3 (three) times daily with meals.  1 vial  1  . insulin glargine (LANTUS) 100 UNIT/ML injection Inject 30 Units into the skin every morning.  10 mL  1  . linagliptin (TRADJENTA) 5 MG TABS tablet Take 1 tablet (5 mg total) by mouth daily with breakfast.  30 tablet  1  . losartan (COZAAR) 100 MG tablet Take 100 mg by mouth daily.      . metoprolol (LOPRESSOR) 50 MG tablet Take 1 tablet (50 mg total) by mouth 2 (two) times daily.  60 tablet  1  . omeprazole (PRILOSEC) 20 MG capsule Take 20 mg by mouth daily as needed. Acid reflux      . timolol (TIMOPTIC) 0.5 % ophthalmic solution Place 1 drop into the left eye daily.      . travoprost, benzalkonium, (TRAVATAN) 0.004 %  ophthalmic solution Place 1 drop into the left eye at bedtime.        Physical Examination  Filed Vitals:   07/01/12 1538  BP: 120/53  Pulse: 57  Resp:     Body mass index is 34.28 kg/(m^2).  General:  WDWN in NAD Gait: Normal HEENT: WNL Eyes: Pupils equal Pulmonary: normal non-labored breathing , without Rales, rhonchi,  wheezing Cardiac: RRR, without  Murmurs, rubs or gallops; Abdomen: soft, NT, no masses Skin: no rashes, ulcers noted  Vascular Exam Pulses: 3+ radial pulses bilaterally Carotid bruits: Mild bruit heard on the right, carotid pulse on the left Extremities without ischemic changes, no Gangrene , no cellulitis; no open wounds;  Musculoskeletal: no muscle wasting or atrophy   Neurologic: A&O X 3; Appropriate Affect ; SENSATION: normal; MOTOR FUNCTION:  moving all extremities equally. Speech is fluent/normal  Non-Invasive Vascular Imaging CAROTID DUPLEX 07/01/2012  Right ICA 60 - 79 % stenosis Left ICA 0 - 19% stenosis   ASSESSMENT/PLAN: Asymptomatic patient with a slight increase in right ICA stenosis. The patient knows the signs and symptoms of CVA and knows to call 911 should that occur. The patient will followup in 6 months with repeat carotid duplex, her questions were encouraged and answered.  Beatris Ship ANP   Clinic MD : Scot Dock

## 2012-07-02 ENCOUNTER — Encounter: Payer: Self-pay | Admitting: Neurosurgery

## 2012-07-02 NOTE — Addendum Note (Signed)
Addended by: Mena Goes on: 07/02/2012 08:56 AM   Modules accepted: Orders

## 2012-09-10 DIAGNOSIS — E538 Deficiency of other specified B group vitamins: Secondary | ICD-10-CM | POA: Diagnosis not present

## 2012-09-23 ENCOUNTER — Other Ambulatory Visit: Payer: Self-pay | Admitting: Orthopedic Surgery

## 2012-09-30 ENCOUNTER — Ambulatory Visit
Admission: RE | Admit: 2012-09-30 | Discharge: 2012-09-30 | Disposition: A | Discharge status: Home / Self Care | Payer: Medicare Other | Source: Ambulatory Visit | Attending: Orthopedic Surgery | Admitting: Orthopedic Surgery

## 2012-09-30 DIAGNOSIS — M1710 Unilateral primary osteoarthritis, unspecified knee: Secondary | ICD-10-CM | POA: Diagnosis not present

## 2012-09-30 DIAGNOSIS — M6580 Other synovitis and tenosynovitis, unspecified site: Secondary | ICD-10-CM | POA: Diagnosis not present

## 2012-10-05 DIAGNOSIS — M23359 Other meniscus derangements, posterior horn of lateral meniscus, unspecified knee: Secondary | ICD-10-CM | POA: Diagnosis not present

## 2012-10-07 DIAGNOSIS — M1710 Unilateral primary osteoarthritis, unspecified knee: Secondary | ICD-10-CM | POA: Diagnosis not present

## 2012-10-19 DIAGNOSIS — M23359 Other meniscus derangements, posterior horn of lateral meniscus, unspecified knee: Secondary | ICD-10-CM | POA: Diagnosis not present

## 2012-10-19 DIAGNOSIS — Y999 Unspecified external cause status: Secondary | ICD-10-CM | POA: Diagnosis not present

## 2012-10-19 DIAGNOSIS — M9420 Chondromalacia, unspecified site: Secondary | ICD-10-CM | POA: Diagnosis not present

## 2012-10-19 DIAGNOSIS — Y939 Activity, unspecified: Secondary | ICD-10-CM | POA: Diagnosis not present

## 2012-10-19 DIAGNOSIS — Y929 Unspecified place or not applicable: Secondary | ICD-10-CM | POA: Diagnosis not present

## 2012-10-19 DIAGNOSIS — X58XXXA Exposure to other specified factors, initial encounter: Secondary | ICD-10-CM | POA: Diagnosis not present

## 2012-10-19 DIAGNOSIS — M659 Synovitis and tenosynovitis, unspecified: Secondary | ICD-10-CM | POA: Diagnosis not present

## 2012-10-19 DIAGNOSIS — M109 Gout, unspecified: Secondary | ICD-10-CM | POA: Diagnosis not present

## 2012-10-19 DIAGNOSIS — S83289A Other tear of lateral meniscus, current injury, unspecified knee, initial encounter: Secondary | ICD-10-CM | POA: Diagnosis not present

## 2012-10-29 DIAGNOSIS — Z85828 Personal history of other malignant neoplasm of skin: Secondary | ICD-10-CM | POA: Diagnosis not present

## 2012-10-29 DIAGNOSIS — L570 Actinic keratosis: Secondary | ICD-10-CM | POA: Diagnosis not present

## 2012-11-03 ENCOUNTER — Other Ambulatory Visit: Payer: Self-pay

## 2012-11-03 DIAGNOSIS — Z1231 Encounter for screening mammogram for malignant neoplasm of breast: Secondary | ICD-10-CM

## 2012-11-04 DIAGNOSIS — N2581 Secondary hyperparathyroidism of renal origin: Secondary | ICD-10-CM | POA: Diagnosis not present

## 2012-11-04 DIAGNOSIS — D649 Anemia, unspecified: Secondary | ICD-10-CM | POA: Diagnosis not present

## 2012-11-04 DIAGNOSIS — M109 Gout, unspecified: Secondary | ICD-10-CM | POA: Diagnosis not present

## 2012-11-04 DIAGNOSIS — N184 Chronic kidney disease, stage 4 (severe): Secondary | ICD-10-CM | POA: Diagnosis not present

## 2012-11-11 DIAGNOSIS — E1139 Type 2 diabetes mellitus with other diabetic ophthalmic complication: Secondary | ICD-10-CM | POA: Diagnosis not present

## 2012-11-11 DIAGNOSIS — E11359 Type 2 diabetes mellitus with proliferative diabetic retinopathy without macular edema: Secondary | ICD-10-CM | POA: Diagnosis not present

## 2012-11-11 DIAGNOSIS — E11349 Type 2 diabetes mellitus with severe nonproliferative diabetic retinopathy without macular edema: Secondary | ICD-10-CM | POA: Diagnosis not present

## 2012-11-30 DIAGNOSIS — H4011X0 Primary open-angle glaucoma, stage unspecified: Secondary | ICD-10-CM | POA: Diagnosis not present

## 2012-11-30 DIAGNOSIS — H409 Unspecified glaucoma: Secondary | ICD-10-CM | POA: Diagnosis not present

## 2012-12-08 DIAGNOSIS — M1710 Unilateral primary osteoarthritis, unspecified knee: Secondary | ICD-10-CM | POA: Diagnosis not present

## 2012-12-08 DIAGNOSIS — E1149 Type 2 diabetes mellitus with other diabetic neurological complication: Secondary | ICD-10-CM | POA: Diagnosis not present

## 2012-12-09 ENCOUNTER — Ambulatory Visit
Admission: RE | Admit: 2012-12-09 | Discharge: 2012-12-09 | Disposition: A | Discharge status: Home / Self Care | Payer: Medicare Other | Source: Ambulatory Visit

## 2012-12-09 DIAGNOSIS — Z1231 Encounter for screening mammogram for malignant neoplasm of breast: Secondary | ICD-10-CM

## 2012-12-30 ENCOUNTER — Ambulatory Visit: Payer: Medicare Other | Admitting: Neurosurgery

## 2012-12-30 ENCOUNTER — Other Ambulatory Visit (INDEPENDENT_AMBULATORY_CARE_PROVIDER_SITE_OTHER): Payer: Medicare Other | Admitting: *Deleted

## 2012-12-30 ENCOUNTER — Other Ambulatory Visit: Payer: Self-pay

## 2012-12-30 DIAGNOSIS — Z48812 Encounter for surgical aftercare following surgery on the circulatory system: Secondary | ICD-10-CM

## 2012-12-30 DIAGNOSIS — I6529 Occlusion and stenosis of unspecified carotid artery: Secondary | ICD-10-CM

## 2013-01-01 ENCOUNTER — Encounter: Payer: Self-pay | Admitting: Vascular Surgery

## 2013-02-08 DIAGNOSIS — M109 Gout, unspecified: Secondary | ICD-10-CM | POA: Diagnosis not present

## 2013-02-08 DIAGNOSIS — N2581 Secondary hyperparathyroidism of renal origin: Secondary | ICD-10-CM | POA: Diagnosis not present

## 2013-02-08 DIAGNOSIS — D649 Anemia, unspecified: Secondary | ICD-10-CM | POA: Diagnosis not present

## 2013-02-08 DIAGNOSIS — N184 Chronic kidney disease, stage 4 (severe): Secondary | ICD-10-CM | POA: Diagnosis not present

## 2013-02-16 ENCOUNTER — Other Ambulatory Visit (HOSPITAL_COMMUNITY): Payer: Self-pay | Admitting: *Deleted

## 2013-02-17 ENCOUNTER — Encounter (HOSPITAL_COMMUNITY)
Admission: RE | Admit: 2013-02-17 | Discharge: 2013-02-17 | Disposition: A | Discharge status: Home / Self Care | Payer: Medicare Other | Source: Ambulatory Visit | Attending: Nephrology | Admitting: Nephrology

## 2013-02-17 DIAGNOSIS — D649 Anemia, unspecified: Secondary | ICD-10-CM | POA: Insufficient documentation

## 2013-02-17 LAB — BASIC METABOLIC PANEL
Chloride: 102 mEq/L (ref 96–112)
GFR calc Af Amer: 18 mL/min — ABNORMAL LOW (ref >90)
GFR calc non Af Amer: 15 mL/min — ABNORMAL LOW (ref >90)
Glucose, Bld: 92 mg/dL (ref 70–99)
Potassium: 4.1 mEq/L (ref 3.5–5.1)
Sodium: 138 mEq/L (ref 135–145)

## 2013-02-17 MED ORDER — EPOETIN ALFA 20000 UNIT/ML IJ SOLN: 20000.0000 [IU] | INTRAMUSCULAR | Status: DC

## 2013-02-25 DIAGNOSIS — I10 Essential (primary) hypertension: Secondary | ICD-10-CM | POA: Diagnosis not present

## 2013-02-25 DIAGNOSIS — I2510 Atherosclerotic heart disease of native coronary artery without angina pectoris: Secondary | ICD-10-CM | POA: Diagnosis not present

## 2013-02-25 DIAGNOSIS — E782 Mixed hyperlipidemia: Secondary | ICD-10-CM | POA: Diagnosis not present

## 2013-02-25 DIAGNOSIS — G4710 Hypersomnia, unspecified: Secondary | ICD-10-CM | POA: Diagnosis not present

## 2013-03-01 DIAGNOSIS — H409 Unspecified glaucoma: Secondary | ICD-10-CM | POA: Diagnosis not present

## 2013-03-01 DIAGNOSIS — H4011X0 Primary open-angle glaucoma, stage unspecified: Secondary | ICD-10-CM | POA: Diagnosis not present

## 2013-03-02 ENCOUNTER — Encounter (HOSPITAL_COMMUNITY)
Admission: RE | Admit: 2013-03-02 | Discharge: 2013-03-02 | Disposition: A | Discharge status: Home / Self Care | Payer: Medicare Other | Source: Ambulatory Visit | Attending: Nephrology | Admitting: Nephrology

## 2013-03-02 DIAGNOSIS — D649 Anemia, unspecified: Secondary | ICD-10-CM | POA: Diagnosis not present

## 2013-03-02 LAB — IRON AND TIBC
Iron: 65 ug/dL (ref 42–135)
TIBC: 362 ug/dL (ref 250–470)

## 2013-03-02 MED ORDER — EPOETIN ALFA 20000 UNIT/ML IJ SOLN
20000.0000 [IU] | INTRAMUSCULAR | Status: DC
  Administered 2013-03-02: 20000 [IU] via SUBCUTANEOUS

## 2013-03-02 MED ORDER — EPOETIN ALFA 20000 UNIT/ML IJ SOLN
INTRAMUSCULAR | Status: AC
  Filled 2013-03-02: qty 1

## 2013-03-10 DIAGNOSIS — D631 Anemia in chronic kidney disease: Secondary | ICD-10-CM | POA: Diagnosis not present

## 2013-03-10 DIAGNOSIS — E782 Mixed hyperlipidemia: Secondary | ICD-10-CM | POA: Diagnosis not present

## 2013-03-10 DIAGNOSIS — E1149 Type 2 diabetes mellitus with other diabetic neurological complication: Secondary | ICD-10-CM | POA: Diagnosis not present

## 2013-03-10 DIAGNOSIS — M109 Gout, unspecified: Secondary | ICD-10-CM | POA: Diagnosis not present

## 2013-03-10 DIAGNOSIS — E1349 Other specified diabetes mellitus with other diabetic neurological complication: Secondary | ICD-10-CM | POA: Diagnosis not present

## 2013-03-10 DIAGNOSIS — I10 Essential (primary) hypertension: Secondary | ICD-10-CM | POA: Diagnosis not present

## 2013-03-10 DIAGNOSIS — N184 Chronic kidney disease, stage 4 (severe): Secondary | ICD-10-CM | POA: Diagnosis not present

## 2013-03-16 ENCOUNTER — Other Ambulatory Visit (HOSPITAL_COMMUNITY): Payer: Self-pay | Admitting: *Deleted

## 2013-03-17 ENCOUNTER — Encounter (HOSPITAL_COMMUNITY)
Admission: RE | Admit: 2013-03-17 | Discharge: 2013-03-17 | Disposition: A | Discharge status: Home / Self Care | Payer: Medicare Other | Source: Ambulatory Visit | Attending: Nephrology | Admitting: Nephrology

## 2013-03-17 DIAGNOSIS — D649 Anemia, unspecified: Secondary | ICD-10-CM | POA: Insufficient documentation

## 2013-03-17 MED ORDER — EPOETIN ALFA 20000 UNIT/ML IJ SOLN
INTRAMUSCULAR | Status: AC
  Filled 2013-03-17: qty 1

## 2013-03-17 MED ORDER — EPOETIN ALFA 20000 UNIT/ML IJ SOLN
20000.0000 [IU] | INTRAMUSCULAR | Status: DC
  Administered 2013-03-17: 20000 [IU] via SUBCUTANEOUS

## 2013-03-17 MED ORDER — SODIUM CHLORIDE 0.9 % IV SOLN
1020.0000 mg | Freq: Once | INTRAVENOUS | Status: AC
  Administered 2013-03-17: 1020 mg via INTRAVENOUS
  Filled 2013-03-17: qty 34

## 2013-03-22 DIAGNOSIS — G4733 Obstructive sleep apnea (adult) (pediatric): Secondary | ICD-10-CM | POA: Diagnosis not present

## 2013-03-31 ENCOUNTER — Encounter (HOSPITAL_COMMUNITY)
Admission: RE | Admit: 2013-03-31 | Discharge: 2013-03-31 | Disposition: A | Discharge status: Home / Self Care | Payer: Medicare Other | Source: Ambulatory Visit | Attending: Nephrology | Admitting: Nephrology

## 2013-03-31 MED ORDER — EPOETIN ALFA 20000 UNIT/ML IJ SOLN
INTRAMUSCULAR | Status: AC
  Administered 2013-03-31: 20000 [IU] via SUBCUTANEOUS
  Filled 2013-03-31: qty 1

## 2013-03-31 MED ORDER — EPOETIN ALFA 20000 UNIT/ML IJ SOLN: 20000.0000 [IU] | INTRAMUSCULAR | Status: DC

## 2013-04-05 DIAGNOSIS — H905 Unspecified sensorineural hearing loss: Secondary | ICD-10-CM | POA: Diagnosis not present

## 2013-04-14 ENCOUNTER — Encounter (HOSPITAL_COMMUNITY)
Admission: RE | Admit: 2013-04-14 | Discharge: 2013-04-14 | Disposition: A | Discharge status: Home / Self Care | Payer: Medicare Other | Source: Ambulatory Visit | Attending: Nephrology | Admitting: Nephrology

## 2013-04-14 DIAGNOSIS — D649 Anemia, unspecified: Secondary | ICD-10-CM | POA: Diagnosis not present

## 2013-04-14 LAB — POCT HEMOGLOBIN-HEMACUE: Hemoglobin: 11.6 g/dL — ABNORMAL LOW (ref 12.0–15.0)

## 2013-04-14 MED ORDER — EPOETIN ALFA 20000 UNIT/ML IJ SOLN: 20000.0000 [IU] | INTRAMUSCULAR | Status: DC

## 2013-04-29 ENCOUNTER — Encounter (HOSPITAL_COMMUNITY)
Admission: RE | Admit: 2013-04-29 | Discharge: 2013-04-29 | Disposition: A | Discharge status: Home / Self Care | Payer: Medicare Other | Source: Ambulatory Visit | Attending: Nephrology | Admitting: Nephrology

## 2013-04-29 DIAGNOSIS — D649 Anemia, unspecified: Secondary | ICD-10-CM | POA: Diagnosis not present

## 2013-04-29 LAB — POCT HEMOGLOBIN-HEMACUE: Hemoglobin: 11.5 g/dL — ABNORMAL LOW (ref 12.0–15.0)

## 2013-04-29 MED ORDER — EPOETIN ALFA 20000 UNIT/ML IJ SOLN: 20000.0000 [IU] | INTRAMUSCULAR | Status: DC

## 2013-05-03 DIAGNOSIS — R809 Proteinuria, unspecified: Secondary | ICD-10-CM | POA: Diagnosis not present

## 2013-05-03 DIAGNOSIS — M1710 Unilateral primary osteoarthritis, unspecified knee: Secondary | ICD-10-CM | POA: Diagnosis not present

## 2013-05-03 DIAGNOSIS — Z23 Encounter for immunization: Secondary | ICD-10-CM | POA: Diagnosis not present

## 2013-05-03 DIAGNOSIS — E119 Type 2 diabetes mellitus without complications: Secondary | ICD-10-CM | POA: Diagnosis not present

## 2013-05-03 DIAGNOSIS — N2581 Secondary hyperparathyroidism of renal origin: Secondary | ICD-10-CM | POA: Diagnosis not present

## 2013-05-03 DIAGNOSIS — I129 Hypertensive chronic kidney disease with stage 1 through stage 4 chronic kidney disease, or unspecified chronic kidney disease: Secondary | ICD-10-CM | POA: Diagnosis not present

## 2013-05-03 DIAGNOSIS — N184 Chronic kidney disease, stage 4 (severe): Secondary | ICD-10-CM | POA: Diagnosis not present

## 2013-05-11 ENCOUNTER — Other Ambulatory Visit (HOSPITAL_COMMUNITY): Payer: Self-pay | Admitting: *Deleted

## 2013-05-12 ENCOUNTER — Encounter (HOSPITAL_COMMUNITY)
Admission: RE | Admit: 2013-05-12 | Discharge: 2013-05-12 | Disposition: A | Discharge status: Home / Self Care | Payer: Medicare Other | Source: Ambulatory Visit | Attending: Nephrology | Admitting: Nephrology

## 2013-05-12 DIAGNOSIS — D649 Anemia, unspecified: Secondary | ICD-10-CM | POA: Diagnosis not present

## 2013-05-12 LAB — POCT HEMOGLOBIN-HEMACUE: Hemoglobin: 10.9 g/dL — ABNORMAL LOW (ref 12.0–15.0)

## 2013-05-12 MED ORDER — EPOETIN ALFA 20000 UNIT/ML IJ SOLN
20000.0000 [IU] | INTRAMUSCULAR | Status: DC
  Administered 2013-05-12: 20000 [IU] via SUBCUTANEOUS

## 2013-05-12 MED ORDER — EPOETIN ALFA 20000 UNIT/ML IJ SOLN
INTRAMUSCULAR | Status: AC
  Administered 2013-05-12: 20000 [IU] via SUBCUTANEOUS
  Filled 2013-05-12: qty 1

## 2013-05-26 ENCOUNTER — Encounter (HOSPITAL_COMMUNITY)
Admission: RE | Admit: 2013-05-26 | Discharge: 2013-05-26 | Disposition: A | Discharge status: Home / Self Care | Payer: Medicare Other | Source: Ambulatory Visit | Attending: Nephrology | Admitting: Nephrology

## 2013-05-26 DIAGNOSIS — D649 Anemia, unspecified: Secondary | ICD-10-CM | POA: Diagnosis not present

## 2013-05-26 LAB — FERRITIN: Ferritin: 262 ng/mL (ref 10–291)

## 2013-05-26 LAB — IRON AND TIBC
Saturation Ratios: 33 % (ref 20–55)
TIBC: 283 ug/dL (ref 250–470)

## 2013-05-26 MED ORDER — EPOETIN ALFA 20000 UNIT/ML IJ SOLN
20000.0000 [IU] | INTRAMUSCULAR | Status: DC
  Administered 2013-05-26: 10:00:00 20000 [IU] via SUBCUTANEOUS

## 2013-05-26 MED ORDER — EPOETIN ALFA 20000 UNIT/ML IJ SOLN
INTRAMUSCULAR | Status: AC
  Filled 2013-05-26: qty 1

## 2013-05-31 DIAGNOSIS — E1139 Type 2 diabetes mellitus with other diabetic ophthalmic complication: Secondary | ICD-10-CM | POA: Diagnosis not present

## 2013-05-31 DIAGNOSIS — E11311 Type 2 diabetes mellitus with unspecified diabetic retinopathy with macular edema: Secondary | ICD-10-CM | POA: Diagnosis not present

## 2013-05-31 DIAGNOSIS — H31009 Unspecified chorioretinal scars, unspecified eye: Secondary | ICD-10-CM | POA: Diagnosis not present

## 2013-05-31 DIAGNOSIS — H35049 Retinal micro-aneurysms, unspecified, unspecified eye: Secondary | ICD-10-CM | POA: Diagnosis not present

## 2013-05-31 DIAGNOSIS — E11359 Type 2 diabetes mellitus with proliferative diabetic retinopathy without macular edema: Secondary | ICD-10-CM | POA: Diagnosis not present

## 2013-05-31 DIAGNOSIS — H35379 Puckering of macula, unspecified eye: Secondary | ICD-10-CM | POA: Diagnosis not present

## 2013-05-31 DIAGNOSIS — E11349 Type 2 diabetes mellitus with severe nonproliferative diabetic retinopathy without macular edema: Secondary | ICD-10-CM | POA: Diagnosis not present

## 2013-06-09 ENCOUNTER — Encounter (HOSPITAL_COMMUNITY)
Admission: RE | Admit: 2013-06-09 | Discharge: 2013-06-09 | Disposition: A | Discharge status: Home / Self Care | Payer: Medicare Other | Source: Ambulatory Visit | Attending: Nephrology | Admitting: Nephrology

## 2013-06-09 MED ORDER — EPOETIN ALFA 20000 UNIT/ML IJ SOLN: 20000.0000 [IU] | INTRAMUSCULAR | Status: DC

## 2013-06-23 ENCOUNTER — Encounter (HOSPITAL_COMMUNITY)
Admission: RE | Admit: 2013-06-23 | Discharge: 2013-06-23 | Disposition: A | Discharge status: Home / Self Care | Payer: Medicare Other | Source: Ambulatory Visit | Attending: Nephrology | Admitting: Nephrology

## 2013-06-23 DIAGNOSIS — D649 Anemia, unspecified: Secondary | ICD-10-CM | POA: Diagnosis not present

## 2013-06-23 LAB — POCT HEMOGLOBIN-HEMACUE: Hemoglobin: 10.8 g/dL — ABNORMAL LOW (ref 12.0–15.0)

## 2013-06-23 MED ORDER — EPOETIN ALFA 20000 UNIT/ML IJ SOLN
20000.0000 [IU] | INTRAMUSCULAR | Status: DC
  Administered 2013-06-23: 20000 [IU] via SUBCUTANEOUS

## 2013-06-23 MED ORDER — EPOETIN ALFA 20000 UNIT/ML IJ SOLN
INTRAMUSCULAR | Status: AC
  Filled 2013-06-23: qty 1

## 2013-06-28 DIAGNOSIS — N2581 Secondary hyperparathyroidism of renal origin: Secondary | ICD-10-CM | POA: Diagnosis not present

## 2013-06-28 DIAGNOSIS — E119 Type 2 diabetes mellitus without complications: Secondary | ICD-10-CM | POA: Diagnosis not present

## 2013-06-28 DIAGNOSIS — Z794 Long term (current) use of insulin: Secondary | ICD-10-CM | POA: Diagnosis not present

## 2013-06-28 DIAGNOSIS — H4011X0 Primary open-angle glaucoma, stage unspecified: Secondary | ICD-10-CM | POA: Diagnosis not present

## 2013-06-28 DIAGNOSIS — H409 Unspecified glaucoma: Secondary | ICD-10-CM | POA: Diagnosis not present

## 2013-06-28 DIAGNOSIS — Z961 Presence of intraocular lens: Secondary | ICD-10-CM | POA: Diagnosis not present

## 2013-06-28 DIAGNOSIS — I129 Hypertensive chronic kidney disease with stage 1 through stage 4 chronic kidney disease, or unspecified chronic kidney disease: Secondary | ICD-10-CM | POA: Diagnosis not present

## 2013-06-28 DIAGNOSIS — N184 Chronic kidney disease, stage 4 (severe): Secondary | ICD-10-CM | POA: Diagnosis not present

## 2013-06-28 DIAGNOSIS — D631 Anemia in chronic kidney disease: Secondary | ICD-10-CM | POA: Diagnosis not present

## 2013-07-07 ENCOUNTER — Encounter (HOSPITAL_COMMUNITY)
Admission: RE | Admit: 2013-07-07 | Discharge: 2013-07-07 | Disposition: A | Discharge status: Home / Self Care | Payer: Medicare Other | Source: Ambulatory Visit | Attending: Nephrology | Admitting: Nephrology

## 2013-07-07 LAB — HEMOGLOBIN AND HEMATOCRIT, BLOOD: Hemoglobin: 11.7 g/dL — ABNORMAL LOW (ref 12.0–15.0)

## 2013-07-07 MED ORDER — EPOETIN ALFA 20000 UNIT/ML IJ SOLN: 20000.0000 [IU] | INTRAMUSCULAR | Status: DC

## 2013-07-21 ENCOUNTER — Encounter (HOSPITAL_COMMUNITY)
Admission: RE | Admit: 2013-07-21 | Discharge: 2013-07-21 | Disposition: A | Discharge status: Home / Self Care | Payer: Medicare Other | Source: Ambulatory Visit | Attending: Nephrology | Admitting: Nephrology

## 2013-07-21 DIAGNOSIS — D649 Anemia, unspecified: Secondary | ICD-10-CM | POA: Diagnosis not present

## 2013-07-21 LAB — POCT HEMOGLOBIN-HEMACUE: Hemoglobin: 11.1 g/dL — ABNORMAL LOW (ref 12.0–15.0)

## 2013-07-21 MED ORDER — EPOETIN ALFA 20000 UNIT/ML IJ SOLN
20000.0000 [IU] | INTRAMUSCULAR | Status: DC
  Administered 2013-07-21: 20000 [IU] via SUBCUTANEOUS

## 2013-07-21 MED ORDER — EPOETIN ALFA 20000 UNIT/ML IJ SOLN
INTRAMUSCULAR | Status: AC
  Filled 2013-07-21: qty 1

## 2013-08-04 ENCOUNTER — Encounter (HOSPITAL_COMMUNITY)
Admission: RE | Admit: 2013-08-04 | Discharge: 2013-08-04 | Disposition: A | Discharge status: Home / Self Care | Payer: Medicare Other | Source: Ambulatory Visit | Attending: Nephrology | Admitting: Nephrology

## 2013-08-04 LAB — POCT HEMOGLOBIN-HEMACUE: Hemoglobin: 11.2 g/dL — ABNORMAL LOW (ref 12.0–15.0)

## 2013-08-04 MED ORDER — EPOETIN ALFA 20000 UNIT/ML IJ SOLN
20000.0000 [IU] | INTRAMUSCULAR | Status: DC
  Administered 2013-08-04: 10:00:00 20000 [IU] via SUBCUTANEOUS

## 2013-08-04 MED ORDER — EPOETIN ALFA 20000 UNIT/ML IJ SOLN
INTRAMUSCULAR | Status: AC
  Filled 2013-08-04: qty 1

## 2013-08-18 ENCOUNTER — Encounter (HOSPITAL_COMMUNITY): Payer: Medicare Other

## 2013-08-18 ENCOUNTER — Encounter (HOSPITAL_COMMUNITY)
Admission: RE | Admit: 2013-08-18 | Discharge: 2013-08-18 | Disposition: A | Discharge status: Home / Self Care | Payer: Medicare Other | Source: Ambulatory Visit | Attending: Nephrology | Admitting: Nephrology

## 2013-08-18 DIAGNOSIS — D649 Anemia, unspecified: Secondary | ICD-10-CM | POA: Diagnosis not present

## 2013-08-18 MED ORDER — EPOETIN ALFA 20000 UNIT/ML IJ SOLN: 20000.0000 [IU] | INTRAMUSCULAR | Status: DC

## 2013-08-19 LAB — POCT HEMOGLOBIN-HEMACUE: HEMOGLOBIN: 12.2 g/dL (ref 12.0–15.0)

## 2013-08-25 ENCOUNTER — Other Ambulatory Visit: Payer: Self-pay | Admitting: Vascular Surgery

## 2013-08-25 DIAGNOSIS — M545 Low back pain, unspecified: Secondary | ICD-10-CM | POA: Diagnosis not present

## 2013-08-25 DIAGNOSIS — M25559 Pain in unspecified hip: Secondary | ICD-10-CM | POA: Diagnosis not present

## 2013-08-25 DIAGNOSIS — Z48812 Encounter for surgical aftercare following surgery on the circulatory system: Secondary | ICD-10-CM

## 2013-08-25 DIAGNOSIS — I6529 Occlusion and stenosis of unspecified carotid artery: Secondary | ICD-10-CM

## 2013-08-31 ENCOUNTER — Other Ambulatory Visit (HOSPITAL_COMMUNITY): Payer: Self-pay | Admitting: *Deleted

## 2013-09-01 ENCOUNTER — Encounter (HOSPITAL_COMMUNITY)
Admission: RE | Admit: 2013-09-01 | Discharge: 2013-09-01 | Disposition: A | Discharge status: Home / Self Care | Payer: Medicare Other | Source: Ambulatory Visit | Attending: Nephrology | Admitting: Nephrology

## 2013-09-01 ENCOUNTER — Other Ambulatory Visit: Payer: Self-pay | Admitting: Orthopedic Surgery

## 2013-09-01 DIAGNOSIS — M25559 Pain in unspecified hip: Secondary | ICD-10-CM

## 2013-09-01 DIAGNOSIS — M545 Low back pain, unspecified: Secondary | ICD-10-CM

## 2013-09-01 DIAGNOSIS — R531 Weakness: Secondary | ICD-10-CM

## 2013-09-01 LAB — POCT HEMOGLOBIN-HEMACUE: HEMOGLOBIN: 10.8 g/dL — AB (ref 12.0–15.0)

## 2013-09-01 MED ORDER — EPOETIN ALFA 20000 UNIT/ML IJ SOLN
20000.0000 [IU] | INTRAMUSCULAR | Status: DC
  Administered 2013-09-01: 20000 [IU] via SUBCUTANEOUS

## 2013-09-01 MED ORDER — EPOETIN ALFA 20000 UNIT/ML IJ SOLN
INTRAMUSCULAR | Status: AC
  Filled 2013-09-01: qty 1

## 2013-09-07 ENCOUNTER — Ambulatory Visit
Admission: RE | Admit: 2013-09-07 | Discharge: 2013-09-07 | Disposition: A | Discharge status: Home / Self Care | Payer: Medicare Other | Source: Ambulatory Visit | Attending: Orthopedic Surgery | Admitting: Orthopedic Surgery

## 2013-09-07 ENCOUNTER — Other Ambulatory Visit: Payer: Medicare Other

## 2013-09-07 DIAGNOSIS — R531 Weakness: Secondary | ICD-10-CM

## 2013-09-07 DIAGNOSIS — M25559 Pain in unspecified hip: Secondary | ICD-10-CM

## 2013-09-07 DIAGNOSIS — M545 Low back pain, unspecified: Secondary | ICD-10-CM

## 2013-09-07 DIAGNOSIS — M5126 Other intervertebral disc displacement, lumbar region: Secondary | ICD-10-CM | POA: Diagnosis not present

## 2013-09-08 DIAGNOSIS — M545 Low back pain, unspecified: Secondary | ICD-10-CM | POA: Diagnosis not present

## 2013-09-08 DIAGNOSIS — M25559 Pain in unspecified hip: Secondary | ICD-10-CM | POA: Diagnosis not present

## 2013-09-13 DIAGNOSIS — IMO0002 Reserved for concepts with insufficient information to code with codable children: Secondary | ICD-10-CM | POA: Diagnosis not present

## 2013-09-15 ENCOUNTER — Encounter (HOSPITAL_COMMUNITY)
Admission: RE | Admit: 2013-09-15 | Discharge: 2013-09-15 | Disposition: A | Discharge status: Home / Self Care | Payer: Medicare Other | Source: Ambulatory Visit | Attending: Nephrology | Admitting: Nephrology

## 2013-09-15 DIAGNOSIS — D649 Anemia, unspecified: Secondary | ICD-10-CM | POA: Diagnosis not present

## 2013-09-15 LAB — POCT HEMOGLOBIN-HEMACUE: Hemoglobin: 12 g/dL (ref 12.0–15.0)

## 2013-09-15 MED ORDER — EPOETIN ALFA 20000 UNIT/ML IJ SOLN: 20000.0000 [IU] | INTRAMUSCULAR | Status: DC

## 2013-09-27 DIAGNOSIS — H409 Unspecified glaucoma: Secondary | ICD-10-CM | POA: Diagnosis not present

## 2013-09-27 DIAGNOSIS — H4011X0 Primary open-angle glaucoma, stage unspecified: Secondary | ICD-10-CM | POA: Diagnosis not present

## 2013-09-29 ENCOUNTER — Encounter (HOSPITAL_COMMUNITY)
Admission: RE | Admit: 2013-09-29 | Discharge: 2013-09-29 | Disposition: A | Discharge status: Home / Self Care | Payer: Medicare Other | Source: Ambulatory Visit | Attending: Nephrology | Admitting: Nephrology

## 2013-09-29 LAB — POCT HEMOGLOBIN-HEMACUE: HEMOGLOBIN: 12 g/dL (ref 12.0–15.0)

## 2013-09-29 LAB — IRON AND TIBC
Iron: 78 ug/dL (ref 42–135)
SATURATION RATIOS: 24 % (ref 20–55)
TIBC: 319 ug/dL (ref 250–470)
UIBC: 241 ug/dL (ref 125–400)

## 2013-09-29 LAB — FERRITIN: Ferritin: 210 ng/mL (ref 10–291)

## 2013-09-29 MED ORDER — EPOETIN ALFA 20000 UNIT/ML IJ SOLN: 20000.0000 [IU] | INTRAMUSCULAR | Status: DC

## 2013-10-04 DIAGNOSIS — I129 Hypertensive chronic kidney disease with stage 1 through stage 4 chronic kidney disease, or unspecified chronic kidney disease: Secondary | ICD-10-CM | POA: Diagnosis not present

## 2013-10-04 DIAGNOSIS — N2581 Secondary hyperparathyroidism of renal origin: Secondary | ICD-10-CM | POA: Diagnosis not present

## 2013-10-04 DIAGNOSIS — N189 Chronic kidney disease, unspecified: Secondary | ICD-10-CM | POA: Diagnosis not present

## 2013-10-04 DIAGNOSIS — N184 Chronic kidney disease, stage 4 (severe): Secondary | ICD-10-CM | POA: Diagnosis not present

## 2013-10-04 DIAGNOSIS — D631 Anemia in chronic kidney disease: Secondary | ICD-10-CM | POA: Diagnosis not present

## 2013-10-11 DIAGNOSIS — IMO0002 Reserved for concepts with insufficient information to code with codable children: Secondary | ICD-10-CM | POA: Diagnosis not present

## 2013-10-12 ENCOUNTER — Other Ambulatory Visit (HOSPITAL_COMMUNITY): Payer: Self-pay | Admitting: *Deleted

## 2013-10-13 ENCOUNTER — Encounter (HOSPITAL_COMMUNITY)
Admission: RE | Admit: 2013-10-13 | Discharge: 2013-10-13 | Disposition: A | Discharge status: Home / Self Care | Payer: Medicare Other | Source: Ambulatory Visit | Attending: Nephrology | Admitting: Nephrology

## 2013-10-13 DIAGNOSIS — D649 Anemia, unspecified: Secondary | ICD-10-CM | POA: Insufficient documentation

## 2013-10-13 LAB — POCT HEMOGLOBIN-HEMACUE: Hemoglobin: 9.9 g/dL — ABNORMAL LOW (ref 12.0–15.0)

## 2013-10-13 MED ORDER — EPOETIN ALFA 20000 UNIT/ML IJ SOLN
20000.0000 [IU] | INTRAMUSCULAR | Status: DC
  Administered 2013-10-13: 20000 [IU] via SUBCUTANEOUS

## 2013-10-13 MED ORDER — EPOETIN ALFA 20000 UNIT/ML IJ SOLN
INTRAMUSCULAR | Status: AC
  Filled 2013-10-13: qty 1

## 2013-10-25 DIAGNOSIS — Z0000 Encounter for general adult medical examination without abnormal findings: Secondary | ICD-10-CM | POA: Diagnosis not present

## 2013-10-25 DIAGNOSIS — E782 Mixed hyperlipidemia: Secondary | ICD-10-CM | POA: Diagnosis not present

## 2013-10-25 DIAGNOSIS — N183 Chronic kidney disease, stage 3 unspecified: Secondary | ICD-10-CM | POA: Diagnosis not present

## 2013-10-25 DIAGNOSIS — E1149 Type 2 diabetes mellitus with other diabetic neurological complication: Secondary | ICD-10-CM | POA: Diagnosis not present

## 2013-10-25 DIAGNOSIS — N184 Chronic kidney disease, stage 4 (severe): Secondary | ICD-10-CM | POA: Diagnosis not present

## 2013-10-25 DIAGNOSIS — D631 Anemia in chronic kidney disease: Secondary | ICD-10-CM | POA: Diagnosis not present

## 2013-10-25 DIAGNOSIS — E538 Deficiency of other specified B group vitamins: Secondary | ICD-10-CM | POA: Diagnosis not present

## 2013-10-25 DIAGNOSIS — E1349 Other specified diabetes mellitus with other diabetic neurological complication: Secondary | ICD-10-CM | POA: Diagnosis not present

## 2013-10-25 DIAGNOSIS — M109 Gout, unspecified: Secondary | ICD-10-CM | POA: Diagnosis not present

## 2013-10-25 DIAGNOSIS — E559 Vitamin D deficiency, unspecified: Secondary | ICD-10-CM | POA: Diagnosis not present

## 2013-10-25 DIAGNOSIS — Z1331 Encounter for screening for depression: Secondary | ICD-10-CM | POA: Diagnosis not present

## 2013-11-01 DIAGNOSIS — L570 Actinic keratosis: Secondary | ICD-10-CM | POA: Diagnosis not present

## 2013-11-01 DIAGNOSIS — Z85828 Personal history of other malignant neoplasm of skin: Secondary | ICD-10-CM | POA: Diagnosis not present

## 2013-11-02 ENCOUNTER — Other Ambulatory Visit: Payer: Self-pay

## 2013-11-02 DIAGNOSIS — Z1231 Encounter for screening mammogram for malignant neoplasm of breast: Secondary | ICD-10-CM

## 2013-11-03 ENCOUNTER — Encounter (HOSPITAL_COMMUNITY)
Admission: RE | Admit: 2013-11-03 | Discharge: 2013-11-03 | Disposition: A | Discharge status: Home / Self Care | Payer: Medicare Other | Source: Ambulatory Visit | Attending: Nephrology | Admitting: Nephrology

## 2013-11-03 LAB — POCT HEMOGLOBIN-HEMACUE: Hemoglobin: 11.1 g/dL — ABNORMAL LOW (ref 12.0–15.0)

## 2013-11-03 MED ORDER — EPOETIN ALFA 20000 UNIT/ML IJ SOLN
INTRAMUSCULAR | Status: AC
  Administered 2013-11-03: 20000 [IU] via SUBCUTANEOUS
  Filled 2013-11-03: qty 1

## 2013-11-03 MED ORDER — EPOETIN ALFA 20000 UNIT/ML IJ SOLN
20000.0000 [IU] | INTRAMUSCULAR | Status: DC
  Administered 2013-11-03: 20000 [IU] via SUBCUTANEOUS

## 2013-11-05 DIAGNOSIS — N184 Chronic kidney disease, stage 4 (severe): Secondary | ICD-10-CM | POA: Diagnosis not present

## 2013-11-15 DIAGNOSIS — IMO0002 Reserved for concepts with insufficient information to code with codable children: Secondary | ICD-10-CM | POA: Diagnosis not present

## 2013-11-24 ENCOUNTER — Encounter (HOSPITAL_COMMUNITY)
Admission: RE | Admit: 2013-11-24 | Discharge: 2013-11-24 | Disposition: A | Discharge status: Home / Self Care | Payer: Medicare Other | Source: Ambulatory Visit | Attending: Nephrology | Admitting: Nephrology

## 2013-11-24 DIAGNOSIS — D649 Anemia, unspecified: Secondary | ICD-10-CM | POA: Insufficient documentation

## 2013-11-24 LAB — POCT HEMOGLOBIN-HEMACUE: Hemoglobin: 11.6 g/dL — ABNORMAL LOW (ref 12.0–15.0)

## 2013-11-24 MED ORDER — EPOETIN ALFA 20000 UNIT/ML IJ SOLN
20000.0000 [IU] | INTRAMUSCULAR | Status: DC
  Administered 2013-11-24: 20000 [IU] via SUBCUTANEOUS

## 2013-11-24 MED ORDER — EPOETIN ALFA 20000 UNIT/ML IJ SOLN
INTRAMUSCULAR | Status: AC
  Administered 2013-11-24: 20000 [IU] via SUBCUTANEOUS
  Filled 2013-11-24: qty 1

## 2013-12-15 ENCOUNTER — Encounter (HOSPITAL_COMMUNITY)
Admission: RE | Admit: 2013-12-15 | Discharge: 2013-12-15 | Disposition: A | Discharge status: Home / Self Care | Payer: Medicare Other | Source: Ambulatory Visit | Attending: Nephrology | Admitting: Nephrology

## 2013-12-15 DIAGNOSIS — D649 Anemia, unspecified: Secondary | ICD-10-CM | POA: Insufficient documentation

## 2013-12-15 LAB — POCT HEMOGLOBIN-HEMACUE: Hemoglobin: 11.2 g/dL — ABNORMAL LOW (ref 12.0–15.0)

## 2013-12-15 MED ORDER — EPOETIN ALFA 20000 UNIT/ML IJ SOLN
INTRAMUSCULAR | Status: AC
  Filled 2013-12-15: qty 1

## 2013-12-15 MED ORDER — EPOETIN ALFA 20000 UNIT/ML IJ SOLN
20000.0000 [IU] | INTRAMUSCULAR | Status: DC
  Administered 2013-12-15: 20000 [IU] via SUBCUTANEOUS

## 2013-12-20 ENCOUNTER — Ambulatory Visit
Admission: RE | Admit: 2013-12-20 | Discharge: 2013-12-20 | Disposition: A | Discharge status: Home / Self Care | Payer: Medicare Other | Source: Ambulatory Visit

## 2013-12-20 DIAGNOSIS — H409 Unspecified glaucoma: Secondary | ICD-10-CM | POA: Diagnosis not present

## 2013-12-20 DIAGNOSIS — Z1231 Encounter for screening mammogram for malignant neoplasm of breast: Secondary | ICD-10-CM | POA: Diagnosis not present

## 2013-12-20 DIAGNOSIS — H4011X0 Primary open-angle glaucoma, stage unspecified: Secondary | ICD-10-CM | POA: Diagnosis not present

## 2013-12-22 DIAGNOSIS — E11359 Type 2 diabetes mellitus with proliferative diabetic retinopathy without macular edema: Secondary | ICD-10-CM | POA: Diagnosis not present

## 2013-12-22 DIAGNOSIS — E11311 Type 2 diabetes mellitus with unspecified diabetic retinopathy with macular edema: Secondary | ICD-10-CM | POA: Diagnosis not present

## 2013-12-22 DIAGNOSIS — E1139 Type 2 diabetes mellitus with other diabetic ophthalmic complication: Secondary | ICD-10-CM | POA: Diagnosis not present

## 2013-12-28 ENCOUNTER — Encounter: Payer: Self-pay | Admitting: Vascular Surgery

## 2013-12-29 ENCOUNTER — Encounter: Payer: Self-pay | Admitting: Vascular Surgery

## 2013-12-29 ENCOUNTER — Ambulatory Visit (INDEPENDENT_AMBULATORY_CARE_PROVIDER_SITE_OTHER): Payer: Medicare Other | Admitting: Vascular Surgery

## 2013-12-29 ENCOUNTER — Ambulatory Visit (HOSPITAL_COMMUNITY)
Admission: RE | Admit: 2013-12-29 | Discharge: 2013-12-29 | Disposition: A | Discharge status: Home / Self Care | Payer: Medicare Other | Source: Ambulatory Visit | Attending: Vascular Surgery | Admitting: Vascular Surgery

## 2013-12-29 VITALS — BP 128/39 | HR 56 | Ht 64.0 in | Wt 215.7 lb

## 2013-12-29 DIAGNOSIS — Z48812 Encounter for surgical aftercare following surgery on the circulatory system: Secondary | ICD-10-CM | POA: Diagnosis not present

## 2013-12-29 DIAGNOSIS — I6529 Occlusion and stenosis of unspecified carotid artery: Secondary | ICD-10-CM

## 2013-12-29 NOTE — Progress Notes (Signed)
History of Present Illness  [name] is a 73 y.o. female who presents for postoperative follow-up for: left CEA (Date: 2009), and follow up on right carotid stenosis.    The patient has had no stroke or TIA symptoms.  She was seen one year ago.  REVIEW OF SYSTEMS: Valu.Nieves ] denotes positive finding; [ ]  denotes negative finding  CARDIOVASCULAR: [ ]  chest pain [ ]  dyspnea on exertion  CONSTITUTIONAL: [ ]  fever [ ]  chills  For VQI Use Only  PRE-ADM LIVING: Home  AMB STATUS: Ambulatory  History   Social History  . Marital Status: Married    Spouse Name: N/A    Number of Children: N/A  . Years of Education: N/A   Occupational History  . Not on file.   Social History Main Topics  . Smoking status: Never Smoker   . Smokeless tobacco: Never Used  . Alcohol Use: No  . Drug Use: No  . Sexual Activity: No   Other Topics Concern  . Not on file   Social History Narrative  . No narrative on file    Current Outpatient Prescriptions on File Prior to Visit  Medication Sig Dispense Refill  . acetaminophen (TYLENOL) 500 MG tablet Take 500 mg by mouth every 6 (six) hours as needed. For pain      . allopurinol (ZYLOPRIM) 100 MG tablet Take 200 mg by mouth daily.       Marland Kitchen amLODipine (NORVASC) 10 MG tablet Take 1 tablet (10 mg total) by mouth daily.  30 tablet  1  . amLODipine (NORVASC) 5 MG tablet Take 5 mg by mouth daily.      Marland Kitchen aspirin 325 MG tablet Take 325 mg by mouth daily.      Marland Kitchen atorvastatin (LIPITOR) 10 MG tablet Take 10 mg by mouth daily.      . B Complex Vitamins (VITAMIN B COMPLEX PO) Take 1 tablet by mouth daily.      . brimonidine (ALPHAGAN P) 0.1 % SOLN Place 1 drop into the left eye 2 (two) times daily.      . Cholecalciferol (VITAMIN D) 2000 UNITS tablet Take 2,000 Units by mouth 2 (two) times daily.      . citalopram (CELEXA) 20 MG tablet Take 20 mg by mouth daily.      . colchicine 0.6 MG tablet Take 0.6 mg by mouth 2 (two) times daily. gout      . fish  oil-omega-3 fatty acids 1000 MG capsule Take 2 g by mouth daily.      . furosemide (LASIX) 40 MG tablet Take 40 mg by mouth 2 (two) times daily.      . insulin aspart (NOVOLOG) 100 UNIT/ML injection Inject 6 Units into the skin 3 (three) times daily with meals.  1 vial  1  . insulin glargine (LANTUS) 100 UNIT/ML injection Inject 30 Units into the skin every morning.  10 mL  1  . linagliptin (TRADJENTA) 5 MG TABS tablet Take 1 tablet (5 mg total) by mouth daily with breakfast.  30 tablet  1  . losartan (COZAAR) 100 MG tablet Take 100 mg by mouth daily.      . metoprolol (LOPRESSOR) 50 MG tablet Take 1 tablet (50 mg total) by mouth 2 (two) times daily.  60 tablet  1  . omeprazole (PRILOSEC) 20 MG capsule Take 20 mg by mouth daily as needed. Acid reflux      . timolol (TIMOPTIC) 0.5 % ophthalmic solution Place  1 drop into the left eye daily.      . travoprost, benzalkonium, (TRAVATAN) 0.004 % ophthalmic solution Place 1 drop into the left eye at bedtime.       No current facility-administered medications on file prior to visit.   Physical Examination  Filed Vitals:   12/29/13 1557  BP: 128/39  Pulse:    Carotid duplex 12/29/2013: Right ICA 60-79% Left <40 %  Lungs CTA, no wheezing Heart RRRR left Neck: Incision is well healed Neuro: CN 2-12 are intact, Motor strength is 5/5  bilaterally, sensation is  grossly intact  Medical Decision Making  [name] is a 73 y.o. female who presents s/p left CEA.  The right carotid stenosis has increased from 1 year ago 40-59%, no 60-79 %.  She is asymptomatic without stroke or TIA symptoms.  We will have her f/u in 6 months for carotid duplex.  [facility] Office: [phone]  VASCULAR QUALITY INITIATIVE FOLLOW UP DATA:  Current smoker: [  ] yes  [ x ] no  Living status: [ x ]  Home  [  ] Nursing home  [  ] Homeless    MEDS:  ASA [ x ] yes  [  ] no- [  ] medical reason  [  ] non compliant  STATIN   [x  ] yes  [  ] no- [  ] medical reason  [  ] non compliant  Beta blocker [  ] yes  [  ] no- [  ] medical reason  [  ] non compliant  ACE inhibitor [  ] yes  [ x ] no- [x  ] medical reason  [  ] non compliant  P2Y12 Antagonist [ x ] none  [  ] clopidogrel-Plavix  [  ] ticlopidine-Ticlid   [  ] prasugrel-Effient  [  ] ticagrelor- Brilinta    Anticoagulant [  ] None  [  ] warfarin  [  ] rivaroxaban-Xarelto [  ] dabigatran- Pradaxa  Neurologic event since D/C:  [ x ] no  [  ] yes: [  ] eye event  [  ] cortical event  [  ] VB event  [  ] non specific event  [  ] right  [  ] left  [  ] TIA  [  ] stroke  Date:   Modified Rankin Score: 0  MI since D/C: [ x ] no  [  ] troponin only  [  ] EKG or clinical  Cranial nerve injury: [  ] none  [  ] resolved  [  ] persistent  Duplex CEA site: [  ] no  [ x ] yes - PSV= 4.4  EDV= 103  ICA/CCA ratio: 4.4  Stenosis= [  ] <40% [  ] 40-59% [ x ] 60-79%  [  ] > 80%  [  ]  Occluded  CEA site re-operation:  [ x ] no   [  ] yes- date of re-op:  CEA site PCI:   [ x ] no   [  ] yes- date of PCI:  Agree with above. Given the progression of the right carotid stenosis we will see her back in 6 months instead of one year. She is on aspirin and is on a statin. She is not a smoker. I have encouraged her to stay as active as possible although her activity is limited by her back issues.  Debra Mayo, MD, FACS Beeper [phone] 12/29/2013

## 2013-12-30 DIAGNOSIS — M5126 Other intervertebral disc displacement, lumbar region: Secondary | ICD-10-CM | POA: Diagnosis not present

## 2013-12-30 DIAGNOSIS — IMO0002 Reserved for concepts with insufficient information to code with codable children: Secondary | ICD-10-CM | POA: Diagnosis not present

## 2014-01-05 ENCOUNTER — Encounter (HOSPITAL_COMMUNITY)
Admission: RE | Admit: 2014-01-05 | Discharge: 2014-01-05 | Disposition: A | Discharge status: Home / Self Care | Payer: Medicare Other | Source: Ambulatory Visit | Attending: Nephrology | Admitting: Nephrology

## 2014-01-05 LAB — IRON AND TIBC
Iron: 67 ug/dL (ref 42–135)
Saturation Ratios: 21 % (ref 20–55)
TIBC: 319 ug/dL (ref 250–470)
UIBC: 252 ug/dL (ref 125–400)

## 2014-01-05 LAB — POCT HEMOGLOBIN-HEMACUE: HEMOGLOBIN: 11.7 g/dL — AB (ref 12.0–15.0)

## 2014-01-05 LAB — FERRITIN: FERRITIN: 173 ng/mL (ref 10–291)

## 2014-01-05 MED ORDER — EPOETIN ALFA 20000 UNIT/ML IJ SOLN
20000.0000 [IU] | INTRAMUSCULAR | Status: DC
  Administered 2014-01-05: 20000 [IU] via SUBCUTANEOUS

## 2014-01-05 MED ORDER — EPOETIN ALFA 20000 UNIT/ML IJ SOLN
INTRAMUSCULAR | Status: AC
  Filled 2014-01-05: qty 1

## 2014-01-10 DIAGNOSIS — N2581 Secondary hyperparathyroidism of renal origin: Secondary | ICD-10-CM | POA: Diagnosis not present

## 2014-01-10 DIAGNOSIS — N184 Chronic kidney disease, stage 4 (severe): Secondary | ICD-10-CM | POA: Diagnosis not present

## 2014-01-10 DIAGNOSIS — N039 Chronic nephritic syndrome with unspecified morphologic changes: Secondary | ICD-10-CM | POA: Diagnosis not present

## 2014-01-10 DIAGNOSIS — D631 Anemia in chronic kidney disease: Secondary | ICD-10-CM | POA: Diagnosis not present

## 2014-01-10 DIAGNOSIS — I129 Hypertensive chronic kidney disease with stage 1 through stage 4 chronic kidney disease, or unspecified chronic kidney disease: Secondary | ICD-10-CM | POA: Diagnosis not present

## 2014-01-10 DIAGNOSIS — N189 Chronic kidney disease, unspecified: Secondary | ICD-10-CM | POA: Diagnosis not present

## 2014-01-25 ENCOUNTER — Other Ambulatory Visit (HOSPITAL_COMMUNITY): Payer: Self-pay | Admitting: *Deleted

## 2014-01-26 ENCOUNTER — Encounter (HOSPITAL_COMMUNITY)
Admission: RE | Admit: 2014-01-26 | Discharge: 2014-01-26 | Disposition: A | Discharge status: Home / Self Care | Payer: Medicare Other | Source: Ambulatory Visit | Attending: Nephrology | Admitting: Nephrology

## 2014-01-26 DIAGNOSIS — D649 Anemia, unspecified: Secondary | ICD-10-CM | POA: Insufficient documentation

## 2014-01-26 LAB — POCT HEMOGLOBIN-HEMACUE: HEMOGLOBIN: 11.8 g/dL — AB (ref 12.0–15.0)

## 2014-01-26 MED ORDER — EPOETIN ALFA 20000 UNIT/ML IJ SOLN
20000.0000 [IU] | INTRAMUSCULAR | Status: DC
  Administered 2014-01-26: 20000 [IU] via SUBCUTANEOUS

## 2014-01-26 MED ORDER — FERUMOXYTOL INJECTION 510 MG/17 ML
1020.0000 mg | Freq: Once | INTRAVENOUS | Status: AC
  Administered 2014-01-26: 1020 mg via INTRAVENOUS
  Filled 2014-01-26: qty 34

## 2014-01-26 MED ORDER — EPOETIN ALFA 20000 UNIT/ML IJ SOLN
INTRAMUSCULAR | Status: AC
  Filled 2014-01-26: qty 1

## 2014-02-16 ENCOUNTER — Encounter (HOSPITAL_COMMUNITY)
Admission: RE | Admit: 2014-02-16 | Discharge: 2014-02-16 | Disposition: A | Discharge status: Home / Self Care | Payer: Medicare Other | Source: Ambulatory Visit | Attending: Nephrology | Admitting: Nephrology

## 2014-02-16 DIAGNOSIS — D649 Anemia, unspecified: Secondary | ICD-10-CM | POA: Diagnosis not present

## 2014-02-16 LAB — POCT HEMOGLOBIN-HEMACUE: HEMOGLOBIN: 12.4 g/dL (ref 12.0–15.0)

## 2014-02-16 MED ORDER — EPOETIN ALFA 20000 UNIT/ML IJ SOLN: 20000.0000 [IU] | INTRAMUSCULAR | Status: DC

## 2014-02-21 ENCOUNTER — Other Ambulatory Visit: Payer: Self-pay | Admitting: Cardiology

## 2014-02-21 ENCOUNTER — Other Ambulatory Visit: Payer: Self-pay | Admitting: Interventional Cardiology

## 2014-02-21 MED ORDER — AMLODIPINE BESYLATE 10 MG PO TABS: 10.0000 mg | ORAL_TABLET | Freq: Every day | ORAL | Status: DC

## 2014-02-21 MED ORDER — ATORVASTATIN CALCIUM 10 MG PO TABS: ORAL_TABLET | ORAL | Status: DC

## 2014-02-28 DIAGNOSIS — M67919 Unspecified disorder of synovium and tendon, unspecified shoulder: Secondary | ICD-10-CM | POA: Diagnosis not present

## 2014-02-28 DIAGNOSIS — M5137 Other intervertebral disc degeneration, lumbosacral region: Secondary | ICD-10-CM | POA: Diagnosis not present

## 2014-02-28 DIAGNOSIS — M719 Bursopathy, unspecified: Secondary | ICD-10-CM | POA: Diagnosis not present

## 2014-03-03 ENCOUNTER — Encounter (HOSPITAL_COMMUNITY): Payer: Medicare Other

## 2014-03-16 ENCOUNTER — Encounter (HOSPITAL_COMMUNITY)
Admission: RE | Admit: 2014-03-16 | Discharge: 2014-03-16 | Disposition: A | Discharge status: Home / Self Care | Payer: Medicare Other | Source: Ambulatory Visit | Attending: Nephrology | Admitting: Nephrology

## 2014-03-16 DIAGNOSIS — D649 Anemia, unspecified: Secondary | ICD-10-CM | POA: Diagnosis not present

## 2014-03-16 LAB — POCT HEMOGLOBIN-HEMACUE: HEMOGLOBIN: 10.7 g/dL — AB (ref 12.0–15.0)

## 2014-03-16 MED ORDER — EPOETIN ALFA 20000 UNIT/ML IJ SOLN
20000.0000 [IU] | INTRAMUSCULAR | Status: DC
  Administered 2014-03-16: 20000 [IU] via SUBCUTANEOUS

## 2014-03-16 MED ORDER — EPOETIN ALFA 20000 UNIT/ML IJ SOLN
INTRAMUSCULAR | Status: AC
  Filled 2014-03-16: qty 1

## 2014-03-23 DIAGNOSIS — M67919 Unspecified disorder of synovium and tendon, unspecified shoulder: Secondary | ICD-10-CM | POA: Diagnosis not present

## 2014-03-23 DIAGNOSIS — IMO0002 Reserved for concepts with insufficient information to code with codable children: Secondary | ICD-10-CM | POA: Diagnosis not present

## 2014-03-23 DIAGNOSIS — M5126 Other intervertebral disc displacement, lumbar region: Secondary | ICD-10-CM | POA: Diagnosis not present

## 2014-03-23 DIAGNOSIS — M719 Bursopathy, unspecified: Secondary | ICD-10-CM | POA: Diagnosis not present

## 2014-04-04 DIAGNOSIS — H4011X0 Primary open-angle glaucoma, stage unspecified: Secondary | ICD-10-CM | POA: Diagnosis not present

## 2014-04-04 DIAGNOSIS — S40019A Contusion of unspecified shoulder, initial encounter: Secondary | ICD-10-CM | POA: Diagnosis not present

## 2014-04-04 DIAGNOSIS — H409 Unspecified glaucoma: Secondary | ICD-10-CM | POA: Diagnosis not present

## 2014-04-04 DIAGNOSIS — M67919 Unspecified disorder of synovium and tendon, unspecified shoulder: Secondary | ICD-10-CM | POA: Diagnosis not present

## 2014-04-11 DIAGNOSIS — R5381 Other malaise: Secondary | ICD-10-CM | POA: Diagnosis not present

## 2014-04-11 DIAGNOSIS — Z23 Encounter for immunization: Secondary | ICD-10-CM | POA: Diagnosis not present

## 2014-04-11 DIAGNOSIS — N189 Chronic kidney disease, unspecified: Secondary | ICD-10-CM | POA: Diagnosis not present

## 2014-04-11 DIAGNOSIS — D631 Anemia in chronic kidney disease: Secondary | ICD-10-CM | POA: Diagnosis not present

## 2014-04-11 DIAGNOSIS — N039 Chronic nephritic syndrome with unspecified morphologic changes: Secondary | ICD-10-CM | POA: Diagnosis not present

## 2014-04-11 DIAGNOSIS — N184 Chronic kidney disease, stage 4 (severe): Secondary | ICD-10-CM | POA: Diagnosis not present

## 2014-04-11 DIAGNOSIS — I129 Hypertensive chronic kidney disease with stage 1 through stage 4 chronic kidney disease, or unspecified chronic kidney disease: Secondary | ICD-10-CM | POA: Diagnosis not present

## 2014-04-11 DIAGNOSIS — N2581 Secondary hyperparathyroidism of renal origin: Secondary | ICD-10-CM | POA: Diagnosis not present

## 2014-04-13 ENCOUNTER — Encounter (HOSPITAL_COMMUNITY)
Admission: RE | Admit: 2014-04-13 | Discharge: 2014-04-13 | Disposition: A | Discharge status: Home / Self Care | Payer: Medicare Other | Source: Ambulatory Visit | Attending: Nephrology | Admitting: Nephrology

## 2014-04-13 DIAGNOSIS — D649 Anemia, unspecified: Secondary | ICD-10-CM | POA: Diagnosis not present

## 2014-04-13 LAB — POCT HEMOGLOBIN-HEMACUE: Hemoglobin: 10.4 g/dL — ABNORMAL LOW (ref 12.0–15.0)

## 2014-04-13 MED ORDER — EPOETIN ALFA 20000 UNIT/ML IJ SOLN
20000.0000 [IU] | INTRAMUSCULAR | Status: DC
  Administered 2014-04-13: 20000 [IU] via SUBCUTANEOUS

## 2014-04-13 MED ORDER — EPOETIN ALFA 20000 UNIT/ML IJ SOLN
INTRAMUSCULAR | Status: AC
  Filled 2014-04-13: qty 1

## 2014-04-19 ENCOUNTER — Other Ambulatory Visit: Payer: Self-pay | Admitting: Cardiology

## 2014-04-19 ENCOUNTER — Other Ambulatory Visit: Payer: Self-pay | Admitting: Interventional Cardiology

## 2014-04-19 MED ORDER — AMLODIPINE BESYLATE 10 MG PO TABS: ORAL_TABLET | ORAL | Status: DC

## 2014-04-19 NOTE — Telephone Encounter (Signed)
Spoke with pt and confirmed that she is is taking amlodipine 10 mg qd. Bp is running [id number] systolic.

## 2014-04-25 DIAGNOSIS — Z23 Encounter for immunization: Secondary | ICD-10-CM | POA: Diagnosis not present

## 2014-04-25 DIAGNOSIS — E1140 Type 2 diabetes mellitus with diabetic neuropathy, unspecified: Secondary | ICD-10-CM | POA: Diagnosis not present

## 2014-04-25 DIAGNOSIS — E1165 Type 2 diabetes mellitus with hyperglycemia: Secondary | ICD-10-CM | POA: Diagnosis not present

## 2014-04-25 DIAGNOSIS — Z0001 Encounter for general adult medical examination with abnormal findings: Secondary | ICD-10-CM | POA: Diagnosis not present

## 2014-04-25 DIAGNOSIS — E1122 Type 2 diabetes mellitus with diabetic chronic kidney disease: Secondary | ICD-10-CM | POA: Diagnosis not present

## 2014-04-25 DIAGNOSIS — E1139 Type 2 diabetes mellitus with other diabetic ophthalmic complication: Secondary | ICD-10-CM | POA: Diagnosis not present

## 2014-05-02 ENCOUNTER — Ambulatory Visit: Payer: Medicare Other | Admitting: Interventional Cardiology

## 2014-05-05 ENCOUNTER — Other Ambulatory Visit: Payer: Self-pay

## 2014-05-05 DIAGNOSIS — N186 End stage renal disease: Secondary | ICD-10-CM

## 2014-05-05 DIAGNOSIS — Z01810 Encounter for preprocedural cardiovascular examination: Secondary | ICD-10-CM

## 2014-05-06 ENCOUNTER — Ambulatory Visit: Payer: Medicare Other | Admitting: Interventional Cardiology

## 2014-05-11 ENCOUNTER — Encounter (HOSPITAL_COMMUNITY)
Admission: RE | Admit: 2014-05-11 | Discharge: 2014-05-11 | Disposition: A | Discharge status: Home / Self Care | Payer: Medicare Other | Source: Ambulatory Visit | Attending: Nephrology | Admitting: Nephrology

## 2014-05-11 DIAGNOSIS — N184 Chronic kidney disease, stage 4 (severe): Secondary | ICD-10-CM | POA: Diagnosis not present

## 2014-05-11 DIAGNOSIS — D631 Anemia in chronic kidney disease: Secondary | ICD-10-CM | POA: Insufficient documentation

## 2014-05-11 LAB — IRON AND TIBC
IRON: 99 ug/dL (ref 42–135)
Saturation Ratios: 36 % (ref 20–55)
TIBC: 278 ug/dL (ref 250–470)
UIBC: 179 ug/dL (ref 125–400)

## 2014-05-11 LAB — FERRITIN: Ferritin: 539 ng/mL — ABNORMAL HIGH (ref 10–291)

## 2014-05-11 LAB — POCT HEMOGLOBIN-HEMACUE: Hemoglobin: 11.3 g/dL — ABNORMAL LOW (ref 12.0–15.0)

## 2014-05-11 MED ORDER — EPOETIN ALFA 20000 UNIT/ML IJ SOLN
INTRAMUSCULAR | Status: AC
  Filled 2014-05-11: qty 1

## 2014-05-11 MED ORDER — EPOETIN ALFA 20000 UNIT/ML IJ SOLN
20000.0000 [IU] | INTRAMUSCULAR | Status: DC
  Administered 2014-05-11: 20000 [IU] via SUBCUTANEOUS

## 2014-05-17 ENCOUNTER — Encounter: Payer: Self-pay | Admitting: Vascular Surgery

## 2014-05-18 ENCOUNTER — Other Ambulatory Visit: Payer: Self-pay

## 2014-05-18 ENCOUNTER — Ambulatory Visit (HOSPITAL_COMMUNITY)
Admission: RE | Admit: 2014-05-18 | Discharge: 2014-05-18 | Disposition: A | Discharge status: Home / Self Care | Payer: Medicare Other | Source: Ambulatory Visit | Attending: Vascular Surgery | Admitting: Vascular Surgery

## 2014-05-18 ENCOUNTER — Ambulatory Visit (INDEPENDENT_AMBULATORY_CARE_PROVIDER_SITE_OTHER)
Admission: RE | Admit: 2014-05-18 | Discharge: 2014-05-18 | Disposition: A | Discharge status: Home / Self Care | Payer: Medicare Other | Source: Ambulatory Visit | Attending: Vascular Surgery | Admitting: Vascular Surgery

## 2014-05-18 ENCOUNTER — Ambulatory Visit (INDEPENDENT_AMBULATORY_CARE_PROVIDER_SITE_OTHER): Payer: Medicare Other | Admitting: Vascular Surgery

## 2014-05-18 ENCOUNTER — Encounter: Payer: Self-pay | Admitting: Vascular Surgery

## 2014-05-18 VITALS — BP 133/47 | HR 61 | Ht 64.5 in | Wt 218.0 lb

## 2014-05-18 DIAGNOSIS — N186 End stage renal disease: Secondary | ICD-10-CM

## 2014-05-18 DIAGNOSIS — I6529 Occlusion and stenosis of unspecified carotid artery: Secondary | ICD-10-CM

## 2014-05-18 DIAGNOSIS — Z01810 Encounter for preprocedural cardiovascular examination: Secondary | ICD-10-CM

## 2014-05-18 NOTE — Progress Notes (Signed)
[facility] [place] HISTORY AND PHYSICAL   History of Present Illness:  Patient is a 73 y.o. year old female who presents for placement of a permanent hemodialysis access. The patient is right handed. .  The patient is not currently on hemodialysis.   Other chronic medical problems include .Significant for adult onset diabetes. She does not require insulin. In addition she has hypertension. She denies  history hypercholesterolemia, history of previous myocardial infarction, history of congestive heart failure or history of COPD.  She had a left CEA (Date: 2009) and is followed every 6 months.     Past Medical History  Diagnosis Date  . Shortness of breath   . Anemia   . Glaucoma   . Hypertension   . Stroke     hx of TIA  . Blood transfusion   . GERD (gastroesophageal reflux disease)   . Arthritis   . Neuromuscular disorder     CARPEL TUNNEL  . Carotid artery occlusion   . Chronic kidney disease   . Atrial fibrillation   . CAD (coronary artery disease)   . Diabetes mellitus without complication   . Cancer     skin    Past Surgical History  Procedure Laterality Date  . Carpel tunnel    . Neck surgery    . Esophagogastroduodenoscopy  07/25/2011    Procedure: ESOPHAGOGASTRODUODENOSCOPY (EGD);  Surgeon: Winfield Cunas., MD;  Location: [facility] ENDOSCOPY;  Service: Endoscopy;  Laterality: N/A;  . Colonoscopy  07/25/2011    Procedure: COLONOSCOPY;  Surgeon: Winfield Cunas., MD;  Location: [facility] ENDOSCOPY;  Service: Endoscopy;  Laterality: N/A;  . Coronary artery bypass graft  07/29/2011    Procedure: CORONARY ARTERY BYPASS GRAFTING (CABG);  Surgeon: Gaye Pollack, MD;  Location: [place];  Service: Open Heart Surgery;  Laterality: N/A;  . Carotid endarterectomy  2009    Right CEA     Social History History  Substance Use Topics  . Smoking status: Never Smoker   . Smokeless tobacco: Never Used  . Alcohol Use: No    Family History Family History   Problem Relation Age of Onset  . Diabetes Mother   . Hypertension Mother   . Heart disease Mother     beofre age 61  . Heart attack Mother   . Cancer Father   . Hyperlipidemia Father   . Hypertension Father   . Deep vein thrombosis Daughter   . Diabetes Daughter   . Hyperlipidemia Daughter   . Hypertension Daughter   . Heart disease Daughter   . Peripheral vascular disease Daughter     Allergies  No Known Allergies   Current Outpatient Prescriptions  Medication Sig Dispense Refill  . acetaminophen (TYLENOL) 500 MG tablet Take 500 mg by mouth every 6 (six) hours as needed. For pain    . allopurinol (ZYLOPRIM) 100 MG tablet Take 200 mg by mouth daily.     Marland Kitchen amLODipine (NORVASC) 10 MG tablet TAKE 1 TABLET (10 MG TOTAL) BY MOUTH DAILY. 30 tablet 0  . aspirin 325 MG tablet Take 325 mg by mouth daily.    Marland Kitchen atorvastatin (LIPITOR) 10 MG tablet TAKE ONE TABLET BY MOUTH ONE TIME DAILY 30 tablet 0  . B Complex Vitamins (VITAMIN B COMPLEX PO) Take 1 tablet by mouth daily.    . brimonidine (ALPHAGAN P) 0.1 % SOLN Place 1 drop into the left eye 2 (two) times daily.    . Cholecalciferol (VITAMIN D) 2000  UNITS tablet Take 2,000 Units by mouth 2 (two) times daily.    . citalopram (CELEXA) 20 MG tablet Take 20 mg by mouth daily.    . colchicine 0.6 MG tablet Take 0.6 mg by mouth 2 (two) times daily. gout    . fish oil-omega-3 fatty acids 1000 MG capsule Take 2 g by mouth daily.    . furosemide (LASIX) 40 MG tablet Take 40 mg by mouth 2 (two) times daily.    Marland Kitchen linagliptin (TRADJENTA) 5 MG TABS tablet Take 1 tablet (5 mg total) by mouth daily with breakfast. 30 tablet 1  . losartan (COZAAR) 100 MG tablet Take 100 mg by mouth daily.    Marland Kitchen omeprazole (PRILOSEC) 20 MG capsule Take 20 mg by mouth daily as needed. Acid reflux    . timolol (TIMOPTIC) 0.5 % ophthalmic solution Place 1 drop into the left eye daily.    . travoprost, benzalkonium, (TRAVATAN) 0.004 % ophthalmic solution Place 1 drop into  the left eye at bedtime.    Marland Kitchen HUMALOG 100 UNIT/ML injection Inject into the skin. Sliding scale    . insulin aspart (NOVOLOG) 100 UNIT/ML injection Inject 6 Units into the skin 3 (three) times daily with meals. 1 vial 1  . insulin glargine (LANTUS) 100 UNIT/ML injection Inject 30 Units into the skin every morning. 10 mL 1  . metoprolol (LOPRESSOR) 50 MG tablet Take 1 tablet (50 mg total) by mouth 2 (two) times daily. 60 tablet 1   No current facility-administered medications for this visit.    ROS:   General:  No weight loss, Fever, chills  HEENT: No recent headaches, no nasal bleeding, no visual changes, no sore throat  Neurologic: No dizziness, blackouts, seizures. No recent symptoms of stroke or mini- stroke. No recent episodes of slurred speech, or temporary blindness.  Cardiac: No recent episodes of chest pain/pressure, no shortness of breath at rest.  No shortness of breath with exertion.  Denies history of atrial fibrillation or irregular heartbeat  Vascular: No history of rest pain in feet.  No history of claudication.  No history of non-healing ulcer, No history of DVT. Palpable radial and brachial pulses bilaterally.  Pulmonary: No home oxygen, no productive cough, no hemoptysis,  No asthma or wheezing  Musculoskeletal:  [x ] Arthritis, [ ]  Low back pain,  [ ]  Joint pain  Hematologic:No history of hypercoagulable state.  No history of easy bleeding.  No history of anemia  Gastrointestinal: No hematochezia or melena,  No gastroesophageal reflux, no trouble swallowing  Urinary: [x ] chronic Kidney disease, [ ]  on HD - [ ]  MWF or [ ]  TTHS, [ ]  Burning with urination, [ ]  Frequent urination, [ ]  Difficulty urinating;   Skin: No rashes  Psychological: No history of anxiety,  No history of depression   Physical Examination  Filed Vitals:   05/18/14 1350  BP: 133/47  Pulse: 61  Height: 5' 4.5" (1.638 m)  Weight: 218 lb (98.884 kg)  SpO2: 100%    Body mass index is  36.86 kg/(m^2).  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  Her GFR is 15% currently.  We will plan AV fistula verses graft verses basilic vein transposition under general anesthesia.  05/31/2014 by Dr. Scot Dock.     PLAN:   [facility] [place] Office: [phone]  Agree with above. Her veins in both arms look marginal. I did discuss the case with Dr. Mercy Moore on the phone today. If she is not a candidate for a fistula, he would like [place] to place an AV graft. Her surgery is scheduled for 05/31/2014.  [name], MD, [place] [phone] 05/18/2014

## 2014-05-19 ENCOUNTER — Other Ambulatory Visit: Payer: Self-pay | Admitting: Interventional Cardiology

## 2014-05-25 ENCOUNTER — Encounter (HOSPITAL_COMMUNITY): Payer: Medicare Other

## 2014-05-29 ENCOUNTER — Encounter: Payer: Self-pay | Admitting: *Deleted

## 2014-05-30 ENCOUNTER — Encounter (HOSPITAL_COMMUNITY): Payer: Self-pay | Admitting: *Deleted

## 2014-05-30 MED ORDER — DEXTROSE 5 % IV SOLN
1.5000 g | INTRAVENOUS | Status: AC
  Administered 2014-05-31: 1.5 g via INTRAVENOUS
  Filled 2014-05-30: qty 1.5

## 2014-05-30 NOTE — Progress Notes (Signed)
   05/30/14 1658  OBSTRUCTIVE SLEEP APNEA  Do you snore loudly (loud enough to be heard through closed doors)?  1  Do you often feel tired, fatigued, or sleepy during the daytime? 1  Do you have, or are you being treated for high blood pressure? 1  BMI more than 35 kg/m2? 1  Age over 74 years old? 1  Gender: 0  Obstructive Sleep Apnea Score 5  Score 4 or greater  Results sent to PCP

## 2014-05-31 ENCOUNTER — Encounter (HOSPITAL_COMMUNITY): Payer: Self-pay | Admitting: *Deleted

## 2014-05-31 ENCOUNTER — Ambulatory Visit (HOSPITAL_COMMUNITY): Payer: Medicare Other | Admitting: Anesthesiology

## 2014-05-31 ENCOUNTER — Ambulatory Visit (HOSPITAL_COMMUNITY): Payer: Medicare Other

## 2014-05-31 ENCOUNTER — Ambulatory Visit (HOSPITAL_COMMUNITY)
Admission: RE | Admit: 2014-05-31 | Discharge: 2014-05-31 | Disposition: A | Discharge status: Home / Self Care | Payer: Medicare Other | Source: Ambulatory Visit | Attending: Vascular Surgery | Admitting: Vascular Surgery

## 2014-05-31 ENCOUNTER — Other Ambulatory Visit: Payer: Self-pay | Admitting: *Deleted

## 2014-05-31 ENCOUNTER — Encounter (HOSPITAL_COMMUNITY)
Admission: RE | Disposition: A | Discharge status: Home / Self Care | Payer: Self-pay | Source: Ambulatory Visit | Attending: Vascular Surgery

## 2014-05-31 ENCOUNTER — Telehealth: Payer: Self-pay | Admitting: Vascular Surgery

## 2014-05-31 DIAGNOSIS — Z951 Presence of aortocoronary bypass graft: Secondary | ICD-10-CM | POA: Insufficient documentation

## 2014-05-31 DIAGNOSIS — K219 Gastro-esophageal reflux disease without esophagitis: Secondary | ICD-10-CM | POA: Insufficient documentation

## 2014-05-31 DIAGNOSIS — Z794 Long term (current) use of insulin: Secondary | ICD-10-CM | POA: Diagnosis not present

## 2014-05-31 DIAGNOSIS — I4891 Unspecified atrial fibrillation: Secondary | ICD-10-CM | POA: Insufficient documentation

## 2014-05-31 DIAGNOSIS — E119 Type 2 diabetes mellitus without complications: Secondary | ICD-10-CM | POA: Diagnosis not present

## 2014-05-31 DIAGNOSIS — I120 Hypertensive chronic kidney disease with stage 5 chronic kidney disease or end stage renal disease: Secondary | ICD-10-CM | POA: Insufficient documentation

## 2014-05-31 DIAGNOSIS — I2510 Atherosclerotic heart disease of native coronary artery without angina pectoris: Secondary | ICD-10-CM | POA: Insufficient documentation

## 2014-05-31 DIAGNOSIS — Z85828 Personal history of other malignant neoplasm of skin: Secondary | ICD-10-CM | POA: Insufficient documentation

## 2014-05-31 DIAGNOSIS — J90 Pleural effusion, not elsewhere classified: Secondary | ICD-10-CM | POA: Diagnosis not present

## 2014-05-31 DIAGNOSIS — Z7982 Long term (current) use of aspirin: Secondary | ICD-10-CM | POA: Diagnosis not present

## 2014-05-31 DIAGNOSIS — Z8673 Personal history of transient ischemic attack (TIA), and cerebral infarction without residual deficits: Secondary | ICD-10-CM | POA: Insufficient documentation

## 2014-05-31 DIAGNOSIS — N184 Chronic kidney disease, stage 4 (severe): Secondary | ICD-10-CM | POA: Diagnosis not present

## 2014-05-31 DIAGNOSIS — Z4931 Encounter for adequacy testing for hemodialysis: Secondary | ICD-10-CM

## 2014-05-31 DIAGNOSIS — M1990 Unspecified osteoarthritis, unspecified site: Secondary | ICD-10-CM | POA: Insufficient documentation

## 2014-05-31 DIAGNOSIS — N186 End stage renal disease: Secondary | ICD-10-CM | POA: Insufficient documentation

## 2014-05-31 DIAGNOSIS — R0602 Shortness of breath: Secondary | ICD-10-CM

## 2014-05-31 HISTORY — PX: AV FISTULA PLACEMENT: SHX1204

## 2014-05-31 HISTORY — DX: Unspecified hearing loss, unspecified ear: H91.90

## 2014-05-31 HISTORY — DX: Depression, unspecified: F32.A

## 2014-05-31 HISTORY — DX: Personal history of urinary calculi: Z87.442

## 2014-05-31 HISTORY — DX: Major depressive disorder, single episode, unspecified: F32.9

## 2014-05-31 LAB — POCT I-STAT 4, (NA,K, GLUC, HGB,HCT)
Glucose, Bld: 154 mg/dL — ABNORMAL HIGH (ref 70–99)
HCT: 32 % — ABNORMAL LOW (ref 36.0–46.0)
Hemoglobin: 10.9 g/dL — ABNORMAL LOW (ref 12.0–15.0)
Potassium: 4.3 mEq/L (ref 3.7–5.3)
Sodium: 139 mEq/L (ref 137–147)

## 2014-05-31 LAB — GLUCOSE, CAPILLARY: GLUCOSE-CAPILLARY: 157 mg/dL — AB (ref 70–99)

## 2014-05-31 SURGERY — ARTERIOVENOUS (AV) FISTULA CREATION
Anesthesia: General | Site: Arm Upper | Laterality: Left

## 2014-05-31 MED ORDER — 0.9 % SODIUM CHLORIDE (POUR BTL) OPTIME
TOPICAL | Status: DC | PRN
  Administered 2014-05-31: 1000 mL

## 2014-05-31 MED ORDER — FENTANYL CITRATE 0.05 MG/ML IJ SOLN
INTRAMUSCULAR | Status: AC
  Filled 2014-05-31: qty 5

## 2014-05-31 MED ORDER — ONDANSETRON HCL 4 MG/2ML IJ SOLN
INTRAMUSCULAR | Status: DC | PRN
  Administered 2014-05-31: 4 mg via INTRAVENOUS

## 2014-05-31 MED ORDER — PROPOFOL 10 MG/ML IV BOLUS
INTRAVENOUS | Status: AC
  Filled 2014-05-31: qty 20

## 2014-05-31 MED ORDER — CHLORHEXIDINE GLUCONATE CLOTH 2 % EX PADS: 6.0000 | MEDICATED_PAD | Freq: Once | CUTANEOUS | Status: DC

## 2014-05-31 MED ORDER — SODIUM CHLORIDE 0.9 % IV SOLN
10.0000 mg | INTRAVENOUS | Status: DC | PRN
  Administered 2014-05-31: 25 ug/min via INTRAVENOUS

## 2014-05-31 MED ORDER — MIDAZOLAM HCL 2 MG/2ML IJ SOLN
INTRAMUSCULAR | Status: AC
Start: 2014-05-31 — End: 2014-05-31
  Filled 2014-05-31: qty 2

## 2014-05-31 MED ORDER — ARTIFICIAL TEARS OP OINT
TOPICAL_OINTMENT | OPHTHALMIC | Status: DC | PRN
  Administered 2014-05-31: 1 via OPHTHALMIC

## 2014-05-31 MED ORDER — ONDANSETRON HCL 4 MG/2ML IJ SOLN
INTRAMUSCULAR | Status: AC
  Filled 2014-05-31: qty 2

## 2014-05-31 MED ORDER — LIDOCAINE HCL (CARDIAC) 20 MG/ML IV SOLN
INTRAVENOUS | Status: DC | PRN
  Administered 2014-05-31: 100 mg via INTRAVENOUS

## 2014-05-31 MED ORDER — MIDAZOLAM HCL 5 MG/5ML IJ SOLN
INTRAMUSCULAR | Status: DC | PRN
  Administered 2014-05-31 (×2): 1 mg via INTRAVENOUS

## 2014-05-31 MED ORDER — OXYCODONE HCL 5 MG PO TABS: 5.0000 mg | ORAL_TABLET | Freq: Once | ORAL | Status: DC | PRN

## 2014-05-31 MED ORDER — OXYCODONE HCL 5 MG PO TABS: 5.0000 mg | ORAL_TABLET | Freq: Four times a day (QID) | ORAL | Status: DC | PRN

## 2014-05-31 MED ORDER — ONDANSETRON HCL 4 MG/2ML IJ SOLN: 4.0000 mg | Freq: Once | INTRAMUSCULAR | Status: DC | PRN

## 2014-05-31 MED ORDER — THROMBIN 20000 UNITS EX SOLR
CUTANEOUS | Status: AC
  Filled 2014-05-31: qty 20000

## 2014-05-31 MED ORDER — OXYCODONE HCL 5 MG/5ML PO SOLN: 5.0000 mg | Freq: Once | ORAL | Status: DC | PRN

## 2014-05-31 MED ORDER — LIDOCAINE HCL (CARDIAC) 20 MG/ML IV SOLN
INTRAVENOUS | Status: AC
  Filled 2014-05-31: qty 5

## 2014-05-31 MED ORDER — MEPERIDINE HCL 25 MG/ML IJ SOLN: 6.2500 mg | INTRAMUSCULAR | Status: DC | PRN

## 2014-05-31 MED ORDER — PROTAMINE SULFATE 10 MG/ML IV SOLN
INTRAVENOUS | Status: DC | PRN
  Administered 2014-05-31: 30 mg via INTRAVENOUS

## 2014-05-31 MED ORDER — HYDROMORPHONE HCL 1 MG/ML IJ SOLN: 0.2500 mg | INTRAMUSCULAR | Status: DC | PRN

## 2014-05-31 MED ORDER — NEOSTIGMINE METHYLSULFATE 10 MG/10ML IV SOLN
INTRAVENOUS | Status: AC
  Filled 2014-05-31: qty 1

## 2014-05-31 MED ORDER — ROCURONIUM BROMIDE 50 MG/5ML IV SOLN
INTRAVENOUS | Status: AC
  Filled 2014-05-31: qty 1

## 2014-05-31 MED ORDER — LIDOCAINE HCL (PF) 1 % IJ SOLN
INTRAMUSCULAR | Status: DC | PRN
  Administered 2014-05-31: 16 mL

## 2014-05-31 MED ORDER — SODIUM CHLORIDE 0.9 % IR SOLN
Status: DC | PRN
  Administered 2014-05-31: 500 mL

## 2014-05-31 MED ORDER — GLYCOPYRROLATE 0.2 MG/ML IJ SOLN
INTRAMUSCULAR | Status: AC
  Filled 2014-05-31: qty 3

## 2014-05-31 MED ORDER — SODIUM CHLORIDE 0.9 % IV SOLN
INTRAVENOUS | Status: DC
  Administered 2014-05-31 (×3): via INTRAVENOUS

## 2014-05-31 MED ORDER — FENTANYL CITRATE 0.05 MG/ML IJ SOLN
INTRAMUSCULAR | Status: DC | PRN
  Administered 2014-05-31 (×2): 50 ug via INTRAVENOUS

## 2014-05-31 MED ORDER — PROPOFOL 10 MG/ML IV BOLUS
INTRAVENOUS | Status: DC | PRN
  Administered 2014-05-31: 200 mg via INTRAVENOUS

## 2014-05-31 MED ORDER — HEPARIN SODIUM (PORCINE) 1000 UNIT/ML IJ SOLN
INTRAMUSCULAR | Status: DC | PRN
  Administered 2014-05-31: 6 mL via INTRAVENOUS

## 2014-05-31 MED ORDER — LIDOCAINE HCL (PF) 1 % IJ SOLN
INTRAMUSCULAR | Status: AC
  Filled 2014-05-31: qty 30

## 2014-05-31 SURGICAL SUPPLY — 43 items
ARMBAND PINK RESTRICT EXTREMIT (MISCELLANEOUS) ×4 IMPLANT
BLADE SURG 10 STRL SS (BLADE) ×8 IMPLANT
CANISTER SUCTION 2500CC (MISCELLANEOUS) ×4 IMPLANT
CLIP TI MEDIUM 24 (CLIP) IMPLANT
CLIP TI MEDIUM 6 (CLIP) ×4 IMPLANT
CLIP TI WIDE RED SMALL 24 (CLIP) IMPLANT
CLIP TI WIDE RED SMALL 6 (CLIP) ×4 IMPLANT
COVER PROBE W GEL 5X96 (DRAPES) IMPLANT
COVER SURGICAL LIGHT HANDLE (MISCELLANEOUS) ×4 IMPLANT
DECANTER SPIKE VIAL GLASS SM (MISCELLANEOUS) ×4 IMPLANT
DERMABOND ADVANCED (GAUZE/BANDAGES/DRESSINGS) ×2
DERMABOND ADVANCED .7 DNX12 (GAUZE/BANDAGES/DRESSINGS) ×2 IMPLANT
DRAIN PENROSE 1/2X12 LTX STRL (WOUND CARE) IMPLANT
ELECT REM PT RETURN 9FT ADLT (ELECTROSURGICAL) ×4
ELECTRODE REM PT RTRN 9FT ADLT (ELECTROSURGICAL) ×2 IMPLANT
GLOVE BIO SURGEON STRL SZ 6.5 (GLOVE) ×6 IMPLANT
GLOVE BIO SURGEON STRL SZ7.5 (GLOVE) ×8 IMPLANT
GLOVE BIO SURGEONS STRL SZ 6.5 (GLOVE) ×2
GLOVE BIOGEL PI IND STRL 6.5 (GLOVE) ×2 IMPLANT
GLOVE BIOGEL PI IND STRL 8 (GLOVE) ×6 IMPLANT
GLOVE BIOGEL PI INDICATOR 6.5 (GLOVE) ×2
GLOVE BIOGEL PI INDICATOR 8 (GLOVE) ×6
GLOVE SKINSENSE NS SZ7.0 (GLOVE) ×4
GLOVE SKINSENSE STRL SZ7.0 (GLOVE) ×4 IMPLANT
GOWN BRE IMP SLV AUR XL STRL (GOWN DISPOSABLE) ×12 IMPLANT
GOWN STRL REUS W/ TWL LRG LVL3 (GOWN DISPOSABLE) ×6 IMPLANT
GOWN STRL REUS W/TWL LRG LVL3 (GOWN DISPOSABLE) ×6
KIT BASIN OR (CUSTOM PROCEDURE TRAY) ×4 IMPLANT
KIT ROOM TURNOVER OR (KITS) ×4 IMPLANT
LIQUID BAND (GAUZE/BANDAGES/DRESSINGS) ×4 IMPLANT
NS IRRIG 1000ML POUR BTL (IV SOLUTION) ×4 IMPLANT
PACK CV ACCESS (CUSTOM PROCEDURE TRAY) ×4 IMPLANT
PAD ARMBOARD 7.5X6 YLW CONV (MISCELLANEOUS) ×8 IMPLANT
SPONGE SURGIFOAM ABS GEL 100 (HEMOSTASIS) IMPLANT
SUT PROLENE 6 0 BV (SUTURE) ×4 IMPLANT
SUT SILK 2 0 SH (SUTURE) IMPLANT
SUT VIC AB 3-0 SH 27 (SUTURE) ×2
SUT VIC AB 3-0 SH 27X BRD (SUTURE) ×2 IMPLANT
SUT VICRYL 4-0 PS2 18IN ABS (SUTURE) ×4 IMPLANT
TOWEL OR 17X24 6PK STRL BLUE (TOWEL DISPOSABLE) ×4 IMPLANT
TOWEL OR 17X26 10 PK STRL BLUE (TOWEL DISPOSABLE) ×4 IMPLANT
UNDERPAD 30X30 INCONTINENT (UNDERPADS AND DIAPERS) ×4 IMPLANT
WATER STERILE IRR 1000ML POUR (IV SOLUTION) ×4 IMPLANT

## 2014-05-31 NOTE — Transfer of Care (Signed)
Immediate Anesthesia Transfer of Care Note  Patient: [name]  Procedure(s) Performed: Procedure(s): Creation of Left Arm arteriovenous brachiocephalic Fistula (Left)  Patient Location: PACU  Anesthesia Type:General  Level of Consciousness: awake and alert   Airway & Oxygen Therapy: Patient Spontanous Breathing and Patient connected to nasal cannula oxygen  Post-op Assessment: Report given to PACU RN and Post -op Vital signs reviewed and stable  Post vital signs: Reviewed and stable  Complications: No apparent anesthesia complications

## 2014-05-31 NOTE — Interval H&P Note (Signed)
History and Physical Interval Note:  05/31/2014 9:39 AM  [name]  has presented today for surgery, with the diagnosis of End Stage Renal Disease N18.6  The various methods of treatment have been discussed with the patient and family. After consideration of risks, benefits and other options for treatment, the patient has consented to  Procedure(s): ARTERIOVENOUS (AV) FISTULA CREATION VERSUS GRAFT INSERTION (Left) BASILIC VEIN TRANSPOSITION (Left) as a surgical intervention .  The patient's history has been reviewed, patient examined, no change in status, stable for surgery.  I have reviewed the patient's chart and labs.  Questions were answered to the patient's satisfaction.     Olia Hinderliter S

## 2014-05-31 NOTE — Anesthesia Postprocedure Evaluation (Signed)
Anesthesia Post Note  Patient: [name]  Procedure(s) Performed: Procedure(s) (LRB): Creation of Left Arm arteriovenous brachiocephalic Fistula (Left)  Anesthesia type: general  Patient location: PACU  Post pain: Pain level controlled  Post assessment: Patient's Cardiovascular Status Stable  Last Vitals:  Filed Vitals:   05/31/14 1357  BP: 132/44  Pulse: 56  Temp:   Resp:     Post vital signs: Reviewed and stable  Level of consciousness: sedated  Complications: No apparent anesthesia complications

## 2014-05-31 NOTE — Telephone Encounter (Addendum)
-----   Message from Mena Goes, RN sent at 05/31/2014 12:26 PM EST ----- Regarding: Schedule   ----- Message -----    From: Angelia Mould, MD    Sent: 05/31/2014  12:24 PM      To: Vvs Charge Pool Subject: charge and f/u                                 PROCEDURE: Left brachiocephalic AV fistula  SURGEON: Judeth Cornfield. Scot Dock, MD, FACS  ASSIST: Leontine Locket, PA  She will need a follow up visit in 4-6 weeks to check on the maturation of her fistula. Thank you. CD   05/31/14: patient was unable to talk, sent letter by mail, dpm

## 2014-05-31 NOTE — H&P (View-Only) (Signed)
[facility] [place] HISTORY AND PHYSICAL   History of Present Illness:  Patient is a 73 y.o. year old female who presents for placement of a permanent hemodialysis access. The patient is right handed. .  The patient is not currently on hemodialysis.   Other chronic medical problems include .Significant for adult onset diabetes. She does not require insulin. In addition she has hypertension. She denies  history hypercholesterolemia, history of previous myocardial infarction, history of congestive heart failure or history of COPD.  She had a left CEA (Date: 2009) and is followed every 6 months.     Past Medical History  Diagnosis Date  . Shortness of breath   . Anemia   . Glaucoma   . Hypertension   . Stroke     hx of TIA  . Blood transfusion   . GERD (gastroesophageal reflux disease)   . Arthritis   . Neuromuscular disorder     CARPEL TUNNEL  . Carotid artery occlusion   . Chronic kidney disease   . Atrial fibrillation   . CAD (coronary artery disease)   . Diabetes mellitus without complication   . Cancer     skin    Past Surgical History  Procedure Laterality Date  . Carpel tunnel    . Neck surgery    . Esophagogastroduodenoscopy  07/25/2011    Procedure: ESOPHAGOGASTRODUODENOSCOPY (EGD);  Surgeon: Winfield Cunas., MD;  Location: [facility] ENDOSCOPY;  Service: Endoscopy;  Laterality: N/A;  . Colonoscopy  07/25/2011    Procedure: COLONOSCOPY;  Surgeon: Winfield Cunas., MD;  Location: [facility] ENDOSCOPY;  Service: Endoscopy;  Laterality: N/A;  . Coronary artery bypass graft  07/29/2011    Procedure: CORONARY ARTERY BYPASS GRAFTING (CABG);  Surgeon: Gaye Pollack, MD;  Location: [place];  Service: Open Heart Surgery;  Laterality: N/A;  . Carotid endarterectomy  2009    Right CEA     Social History History  Substance Use Topics  . Smoking status: Never Smoker   . Smokeless tobacco: Never Used  . Alcohol Use: No    Family History Family History   Problem Relation Age of Onset  . Diabetes Mother   . Hypertension Mother   . Heart disease Mother     beofre age 53  . Heart attack Mother   . Cancer Father   . Hyperlipidemia Father   . Hypertension Father   . Deep vein thrombosis Daughter   . Diabetes Daughter   . Hyperlipidemia Daughter   . Hypertension Daughter   . Heart disease Daughter   . Peripheral vascular disease Daughter     Allergies  No Known Allergies   Current Outpatient Prescriptions  Medication Sig Dispense Refill  . acetaminophen (TYLENOL) 500 MG tablet Take 500 mg by mouth every 6 (six) hours as needed. For pain    . allopurinol (ZYLOPRIM) 100 MG tablet Take 200 mg by mouth daily.     Marland Kitchen amLODipine (NORVASC) 10 MG tablet TAKE 1 TABLET (10 MG TOTAL) BY MOUTH DAILY. 30 tablet 0  . aspirin 325 MG tablet Take 325 mg by mouth daily.    Marland Kitchen atorvastatin (LIPITOR) 10 MG tablet TAKE ONE TABLET BY MOUTH ONE TIME DAILY 30 tablet 0  . B Complex Vitamins (VITAMIN B COMPLEX PO) Take 1 tablet by mouth daily.    . brimonidine (ALPHAGAN P) 0.1 % SOLN Place 1 drop into the left eye 2 (two) times daily.    . Cholecalciferol (VITAMIN D) 2000  UNITS tablet Take 2,000 Units by mouth 2 (two) times daily.    . citalopram (CELEXA) 20 MG tablet Take 20 mg by mouth daily.    . colchicine 0.6 MG tablet Take 0.6 mg by mouth 2 (two) times daily. gout    . fish oil-omega-3 fatty acids 1000 MG capsule Take 2 g by mouth daily.    . furosemide (LASIX) 40 MG tablet Take 40 mg by mouth 2 (two) times daily.    Marland Kitchen linagliptin (TRADJENTA) 5 MG TABS tablet Take 1 tablet (5 mg total) by mouth daily with breakfast. 30 tablet 1  . losartan (COZAAR) 100 MG tablet Take 100 mg by mouth daily.    Marland Kitchen omeprazole (PRILOSEC) 20 MG capsule Take 20 mg by mouth daily as needed. Acid reflux    . timolol (TIMOPTIC) 0.5 % ophthalmic solution Place 1 drop into the left eye daily.    . travoprost, benzalkonium, (TRAVATAN) 0.004 % ophthalmic solution Place 1 drop into  the left eye at bedtime.    Marland Kitchen HUMALOG 100 UNIT/ML injection Inject into the skin. Sliding scale    . insulin aspart (NOVOLOG) 100 UNIT/ML injection Inject 6 Units into the skin 3 (three) times daily with meals. 1 vial 1  . insulin glargine (LANTUS) 100 UNIT/ML injection Inject 30 Units into the skin every morning. 10 mL 1  . metoprolol (LOPRESSOR) 50 MG tablet Take 1 tablet (50 mg total) by mouth 2 (two) times daily. 60 tablet 1   No current facility-administered medications for this visit.    ROS:   General:  No weight loss, Fever, chills  HEENT: No recent headaches, no nasal bleeding, no visual changes, no sore throat  Neurologic: No dizziness, blackouts, seizures. No recent symptoms of stroke or mini- stroke. No recent episodes of slurred speech, or temporary blindness.  Cardiac: No recent episodes of chest pain/pressure, no shortness of breath at rest.  No shortness of breath with exertion.  Denies history of atrial fibrillation or irregular heartbeat  Vascular: No history of rest pain in feet.  No history of claudication.  No history of non-healing ulcer, No history of DVT. Palpable radial and brachial pulses bilaterally.  Pulmonary: No home oxygen, no productive cough, no hemoptysis,  No asthma or wheezing  Musculoskeletal:  [x ] Arthritis, [ ]  Low back pain,  [ ]  Joint pain  Hematologic:No history of hypercoagulable state.  No history of easy bleeding.  No history of anemia  Gastrointestinal: No hematochezia or melena,  No gastroesophageal reflux, no trouble swallowing  Urinary: [x ] chronic Kidney disease, [ ]  on HD - [ ]  MWF or [ ]  TTHS, [ ]  Burning with urination, [ ]  Frequent urination, [ ]  Difficulty urinating;   Skin: No rashes  Psychological: No history of anxiety,  No history of depression   Physical Examination  Filed Vitals:   05/18/14 1350  BP: 133/47  Pulse: 61  Height: 5' 4.5" (1.638 m)  Weight: 218 lb (98.884 kg)  SpO2: 100%    Body mass index is  36.86 kg/(m^2).  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  Her GFR is 15% currently.  We will plan AV fistula verses graft verses basilic vein transposition under general anesthesia.  05/31/2014 by Dr. Scot Dock.     PLAN:   [facility] [place] Office: [phone]  Agree with above. Her veins in both arms look marginal. I did discuss the case with Dr. Mercy Moore on the phone today. If she is not a candidate for a fistula, he would like [place] to place an AV graft. Her surgery is scheduled for 05/31/2014.  [name], MD, [place] [phone] 05/18/2014

## 2014-05-31 NOTE — Discharge Instructions (Signed)
° ° °  05/31/2014 [name] [id number] 08-May-1941  Surgeon(s): Angelia Mould, MD  Procedure(s): Creation of Left Arm arteriovenous brachiocephalic Fistula  x Do not stick graft for 12 weeks   What to eat:  For your first meals, you should eat lightly; only small meals initially.  If you do not have nausea, you may eat larger meals.  Avoid spicy, greasy and heavy food.    General Anesthesia, Adult, Care After  Refer to this sheet in the next few weeks. These instructions provide you with information on caring for yourself after your procedure. Your health care provider may also give you more specific instructions. Your treatment has been planned according to current medical practices, but problems sometimes occur. Call your health care provider if you have any problems or questions after your procedure.  WHAT TO EXPECT AFTER THE PROCEDURE  After the procedure, it is typical to experience:  Sleepiness.  Nausea and vomiting. HOME CARE INSTRUCTIONS  For the first 24 hours after general anesthesia:  Have a responsible person with you.  Do not drive a car. If you are alone, do not take public transportation.  Do not drink alcohol.  Do not take medicine that has not been prescribed by your health care provider.  Do not sign important papers or make important decisions.  You may resume a normal diet and activities as directed by your health care provider.  Change bandages (dressings) as directed.  If you have questions or problems that seem related to general anesthesia, call the hospital and ask for the anesthetist or anesthesiologist on call. SEEK MEDICAL CARE IF:  You have nausea and vomiting that continue the day after anesthesia.  You develop a rash. SEEK IMMEDIATE MEDICAL CARE IF:  You have difficulty breathing.  You have chest pain.  You have any allergic problems. Document Released: 10/07/2000 Document Revised: 03/03/2013 Document Reviewed: 01/14/2013  [facility]  Patient Information 2014 [place], [place].

## 2014-05-31 NOTE — Op Note (Signed)
    NAME: [name]   MRN: [id number] DOB: 1940-07-31    DATE OF OPERATION: 05/31/2014  PREOP DIAGNOSIS: Stage IV chronic kidney disease   POSTOP DIAGNOSIS: Same  PROCEDURE: Left brachiocephalic AV fistula  SURGEON: Judeth Cornfield. Scot Dock, MD, FACS  ASSIST: Leontine Locket, PA  ANESTHESIA: Gen.   EBL: minimal  INDICATIONS: [name] is a 73 y.o. female we presents for new access.  FINDINGS: 3 mm cephalic vein. Brachial artery was calcified.  TECHNIQUE: The patient was taken to the operating room and received a general anesthetic. The left upper extremity was prepped and draped in the usual sterile fashion. The ultrasound scanner was used in the upper arm cephalic vein although somewhat small appeared reasonable. The basilic vein was small. A transverse incision was made just above the antecubital level. Here the cephalic vein was dissected free. A lateral branch was divided between 3-0 silk ties. The vein was ligated distally. It was irrigated up with heparinized saline. It was an approximate 3 mm vein. The brachial artery was dissected free beneath the fascia. It had some calcific disease. Given the small size of the vein elected to spatulate venous anastomosis some. The patient was heparinized. The brachial artery is approximately and distally and a longitudinal arteriotomy was made. The vein was sewn end-to-side to the artery using continuous 6-0 Prolene suture. At the completion of the palpable thrill in the fistula and a palpable radial pulse. The heparin was partially reversed with protamine. The wounds closed the deep layer of 3-0 Vicryl and skin closed with 4-0 Vicryl. Dermabond was applied. The patient tolerated the procedure well and was transferred to the recovery room in stable condition. All needle and sponge counts were correct.  [name], MD, FACS [facility]  DATE OF DICTATION:   05/31/2014

## 2014-05-31 NOTE — Anesthesia Preprocedure Evaluation (Addendum)
Anesthesia Evaluation  Patient identified by MRN, date of birth, ID band Patient awake    Reviewed: Allergy & Precautions, H&P , NPO status , Patient's Chart, lab work & pertinent test results  Airway Mallampati: II  TM Distance: >3 FB Neck ROM: Limited  Mouth opening: Limited Mouth Opening  Dental  (+) Dental Advisory Given, Missing, Poor Dentition   Pulmonary          Cardiovascular hypertension, Pt. on medications + CAD and + CABG     Neuro/Psych    GI/Hepatic GERD-  Controlled,  Endo/Other  diabetes, Well Controlled, Type 2, Insulin Dependent  Renal/GU CRFRenal disease     Musculoskeletal   Abdominal   Peds  Hematology   Anesthesia Other Findings   Reproductive/Obstetrics                            Anesthesia Physical Anesthesia Plan  ASA: III  Anesthesia Plan: General   Post-op Pain Management:    Induction: Intravenous  Airway Management Planned: LMA  Additional Equipment:   Intra-op Plan:   Post-operative Plan: Extubation in OR  Informed Consent: I have reviewed the patients History and Physical, chart, labs and discussed the procedure including the risks, benefits and alternatives for the proposed anesthesia with the patient or authorized representative who has indicated his/her understanding and acceptance.     Plan Discussed with: CRNA and Surgeon  Anesthesia Plan Comments:         Anesthesia Quick Evaluation

## 2014-05-31 NOTE — Anesthesia Procedure Notes (Signed)
Procedure Name: LMA Insertion Date/Time: 05/31/2014 10:20 AM Performed by: Octavio Graves Pre-anesthesia Checklist: Patient identified, Timeout performed, Emergency Drugs available, Suction available and Patient being monitored Patient Re-evaluated:Patient Re-evaluated prior to inductionOxygen Delivery Method: Circle system utilized Preoxygenation: Pre-oxygenation with 100% oxygen Intubation Type: IV induction Ventilation: Mask ventilation without difficulty LMA: LMA inserted LMA Size: 4.0 Tube type: Oral Number of attempts: 1 Placement Confirmation: positive ETCO2 and breath sounds checked- equal and bilateral Tube secured with: Tape Dental Injury: Teeth and Oropharynx as per pre-operative assessment  Comments: IV induction Ossey- LMA insertion Ossey- atraumatic- teeth and mouth as preop

## 2014-06-01 ENCOUNTER — Encounter (HOSPITAL_COMMUNITY): Payer: Self-pay | Admitting: Vascular Surgery

## 2014-06-03 ENCOUNTER — Encounter: Payer: Self-pay | Admitting: Interventional Cardiology

## 2014-06-07 ENCOUNTER — Encounter: Payer: Self-pay | Admitting: Interventional Cardiology

## 2014-06-07 ENCOUNTER — Ambulatory Visit (INDEPENDENT_AMBULATORY_CARE_PROVIDER_SITE_OTHER): Payer: Medicare Other | Admitting: Interventional Cardiology

## 2014-06-07 VITALS — BP 130/60 | HR 67 | Ht 64.0 in | Wt 190.0 lb

## 2014-06-07 DIAGNOSIS — E785 Hyperlipidemia, unspecified: Secondary | ICD-10-CM | POA: Diagnosis not present

## 2014-06-07 DIAGNOSIS — I10 Essential (primary) hypertension: Secondary | ICD-10-CM

## 2014-06-07 DIAGNOSIS — I6529 Occlusion and stenosis of unspecified carotid artery: Secondary | ICD-10-CM

## 2014-06-07 DIAGNOSIS — I2510 Atherosclerotic heart disease of native coronary artery without angina pectoris: Secondary | ICD-10-CM | POA: Diagnosis not present

## 2014-06-07 NOTE — Progress Notes (Signed)
Patient ID: [name], female   DOB: August 22, 1940, 73 y.o.   MRN: [id number]    [place], [place], [place]  [address] Phone: [phone] Fax:  [phone]  Date:  06/07/2014   ID:  [name], [name] November 11, 1940, MRN [id number]  PCP:  Henrine Screws, MD      History of Present Illness: YMANI THAEMERT is a 73 y.o. female  who has had bypass surgery 3 years ago. She has been limited mostly by progressive renal dysfunction. She has had a left AV fistula placed recently. She has had a tough time recovering from the surgery. She is hoping to avoid dialysis for at least another 6 months. She denies any chest discomfort. She does have generalized fatigue. She does not walk much. She has not used any nitroglycerin. She denies palpitations lightheadedness or syncope. She has lost weight due to lack of appetite.   Wt Readings from Last 3 Encounters:  06/07/14 190 lb (86.183 kg)  05/31/14 218 lb (98.884 kg)  05/18/14 218 lb (98.884 kg)     Past Medical History  Diagnosis Date  . Anemia   . Glaucoma   . Hypertension   . Blood transfusion   . GERD (gastroesophageal reflux disease)   . Arthritis   . Neuromuscular disorder     CARPEL TUNNEL  . Chronic kidney disease   . Atrial fibrillation   . CAD (coronary artery disease)   . Diabetes mellitus without complication   . Carotid artery occlusion     Carotid Endartectom,y - left 2009.  Blockage Right being watched by Dr Scot Dock.  Marland Kitchen HOH (hard of hearing)   . Stroke     hx of TIA  . Shortness of breath   . Depression   . History of kidney stones     passed  . Cancer     .  top of head- melonoma    Current Outpatient Prescriptions  Medication Sig Dispense Refill  . acetaminophen (TYLENOL) 500 MG tablet Take 500 mg by mouth every 6 (six) hours as needed. For pain    . allopurinol (ZYLOPRIM) 100 MG tablet Take 200 mg by mouth daily.     Marland Kitchen amLODipine (NORVASC) 10 MG tablet TAKE 1 TABLET (10 MG TOTAL) BY MOUTH  DAILY. 30 tablet 2  . aspirin 325 MG tablet Take 325 mg by mouth daily.    Marland Kitchen atorvastatin (LIPITOR) 10 MG tablet TAKE ONE TABLET BY MOUTH ONE TIME DAILY 30 tablet 0  . B Complex Vitamins (VITAMIN B COMPLEX PO) Take 1 tablet by mouth daily.    . brimonidine (ALPHAGAN P) 0.1 % SOLN Place 1 drop into the left eye 2 (two) times daily.    . Cholecalciferol (VITAMIN D) 2000 UNITS tablet Take 2,000 Units by mouth 2 (two) times daily.    . citalopram (CELEXA) 20 MG tablet Take 20 mg by mouth daily.    . colchicine 0.6 MG tablet Take 0.6 mg by mouth 2 (two) times daily. gout    . fish oil-omega-3 fatty acids 1000 MG capsule Take 2 g by mouth daily.    . furosemide (LASIX) 40 MG tablet Take 40 mg by mouth 2 (two) times daily.    Marland Kitchen HUMALOG 100 UNIT/ML injection Inject 0-12 Units into the skin 3 (three) times daily with meals. Sliding scale    . linagliptin (TRADJENTA) 5 MG TABS tablet Take 1 tablet (5 mg total) by mouth daily with breakfast. 30 tablet  1  . losartan (COZAAR) 100 MG tablet Take 100 mg by mouth daily.    Marland Kitchen omeprazole (PRILOSEC) 20 MG capsule Take 20 mg by mouth daily as needed. Acid reflux    . timolol (TIMOPTIC) 0.5 % ophthalmic solution Place 1 drop into the left eye daily.    . travoprost, benzalkonium, (TRAVATAN) 0.004 % ophthalmic solution Place 1 drop into the left eye at bedtime.    . insulin glargine (LANTUS) 100 UNIT/ML injection Inject 30 Units into the skin every morning. 10 mL 1  . metoprolol (LOPRESSOR) 50 MG tablet Take 1 tablet (50 mg total) by mouth 2 (two) times daily. 60 tablet 1  . oxyCODONE (ROXICODONE) 5 MG immediate release tablet Take 1 tablet (5 mg total) by mouth every 6 (six) hours as needed. (Patient not taking: Reported on 06/07/2014) 20 tablet 0   No current facility-administered medications for this visit.    Allergies:   No Known Allergies  Social History:  The patient  reports that she has never smoked. She has never used smokeless tobacco. She reports that  she does not drink alcohol or use illicit drugs.   Family History:  The patient's family history includes Cancer in her father; Deep vein thrombosis in her daughter; Diabetes in her daughter and mother; Heart attack in her mother; Heart disease in her daughter and mother; Hyperlipidemia in her daughter and father; Hypertension in her daughter, father, and mother; Peripheral vascular disease in her daughter.   ROS:  Please see the history of present illness.  No nausea, vomiting.  No fevers, chills.  No focal weakness.  No dysuria. Fatigue   All other systems reviewed and negative.   PHYSICAL EXAM: VS:  BP 130/60 mmHg  Pulse 67  Ht 5\' 4"  (1.626 m)  Wt 190 lb (86.183 kg)  BMI 32.60 kg/m2 General: Well developed, well nourished, in no acute distress HEENT: normal Neck: no JVD, no carotid bruits Cardiac:  normal S1, S2; RRR; 2/6 systolic murmur Lungs:  clear to auscultation bilaterally, no wheezing, rhonchi or rales Abd: soft, nontender, no hepatomegaly Ext: no edema, Well-healing left AV fistula scar Skin: warm and dry Neuro:   no focal abnormalities noted Psych: normal affect  EKG:   Sinus bradycardia, nonspecific ST segment changes  ASSESSMENT AND PLAN:  1. CAD: Continue aspirin and atorvastatin. No angina since her bypass surgery. Try to walk as much as possible. This is limited by leg pain and weakness. 2. Hypertension: Controlled today. Continue current antihypertensives. 3. Hyperlipidemia: Continue atorvastatin and fish oil. Lipids checked by Dr. Inda Merlin. LDL target less than 100. 4. Renal insufficiency: Would not pursue any cardiac workup at this time given her borderline renal function. She is anticipated to be on dialysis within the next year.  Signed, Mina Marble, MD, [facility] 06/07/2014 5:06 PM

## 2014-06-07 NOTE — Patient Instructions (Signed)
Your physician recommends that you continue on your current medications as directed. Please refer to the Current Medication list given to you today.  Your physician wants you to follow-up in: 1 year with Dr. Varanasi. You will receive a reminder letter in the mail two months in advance. If you don't receive a letter, please call our office to schedule the follow-up appointment.  

## 2014-06-08 ENCOUNTER — Encounter (HOSPITAL_COMMUNITY)
Admission: RE | Admit: 2014-06-08 | Discharge: 2014-06-08 | Disposition: A | Discharge status: Home / Self Care | Payer: Medicare Other | Source: Ambulatory Visit | Attending: Nephrology | Admitting: Nephrology

## 2014-06-08 DIAGNOSIS — N184 Chronic kidney disease, stage 4 (severe): Secondary | ICD-10-CM | POA: Insufficient documentation

## 2014-06-08 DIAGNOSIS — I10 Essential (primary) hypertension: Secondary | ICD-10-CM | POA: Insufficient documentation

## 2014-06-08 DIAGNOSIS — E1169 Type 2 diabetes mellitus with other specified complication: Secondary | ICD-10-CM | POA: Insufficient documentation

## 2014-06-08 DIAGNOSIS — D631 Anemia in chronic kidney disease: Secondary | ICD-10-CM | POA: Diagnosis not present

## 2014-06-08 DIAGNOSIS — I2510 Atherosclerotic heart disease of native coronary artery without angina pectoris: Secondary | ICD-10-CM | POA: Insufficient documentation

## 2014-06-08 DIAGNOSIS — E785 Hyperlipidemia, unspecified: Secondary | ICD-10-CM | POA: Insufficient documentation

## 2014-06-08 LAB — POCT HEMOGLOBIN-HEMACUE
HEMOGLOBIN: 9.7 g/dL — AB (ref 12.0–15.0)
Hemoglobin: 9.8 g/dL — ABNORMAL LOW (ref 12.0–15.0)

## 2014-06-08 MED ORDER — EPOETIN ALFA 20000 UNIT/ML IJ SOLN
INTRAMUSCULAR | Status: AC
Start: 2014-06-08 — End: 2014-06-08
  Filled 2014-06-08: qty 1

## 2014-06-08 MED ORDER — EPOETIN ALFA 20000 UNIT/ML IJ SOLN
20000.0000 [IU] | INTRAMUSCULAR | Status: DC
  Administered 2014-06-08: 20000 [IU] via SUBCUTANEOUS

## 2014-06-21 DIAGNOSIS — N2581 Secondary hyperparathyroidism of renal origin: Secondary | ICD-10-CM | POA: Diagnosis not present

## 2014-06-21 DIAGNOSIS — N189 Chronic kidney disease, unspecified: Secondary | ICD-10-CM | POA: Diagnosis not present

## 2014-06-21 DIAGNOSIS — N184 Chronic kidney disease, stage 4 (severe): Secondary | ICD-10-CM | POA: Diagnosis not present

## 2014-06-21 DIAGNOSIS — D631 Anemia in chronic kidney disease: Secondary | ICD-10-CM | POA: Diagnosis not present

## 2014-06-21 DIAGNOSIS — I129 Hypertensive chronic kidney disease with stage 1 through stage 4 chronic kidney disease, or unspecified chronic kidney disease: Secondary | ICD-10-CM | POA: Diagnosis not present

## 2014-06-29 ENCOUNTER — Encounter (HOSPITAL_COMMUNITY)
Admission: RE | Admit: 2014-06-29 | Discharge: 2014-06-29 | Disposition: A | Discharge status: Home / Self Care | Payer: Medicare Other | Source: Ambulatory Visit | Attending: Nephrology | Admitting: Nephrology

## 2014-06-29 DIAGNOSIS — N184 Chronic kidney disease, stage 4 (severe): Secondary | ICD-10-CM | POA: Diagnosis not present

## 2014-06-29 DIAGNOSIS — D631 Anemia in chronic kidney disease: Secondary | ICD-10-CM | POA: Insufficient documentation

## 2014-06-29 LAB — POCT HEMOGLOBIN-HEMACUE: Hemoglobin: 10.1 g/dL — ABNORMAL LOW (ref 12.0–15.0)

## 2014-06-29 MED ORDER — EPOETIN ALFA 20000 UNIT/ML IJ SOLN
INTRAMUSCULAR | Status: AC
  Filled 2014-06-29: qty 1

## 2014-06-29 MED ORDER — EPOETIN ALFA 20000 UNIT/ML IJ SOLN
20000.0000 [IU] | INTRAMUSCULAR | Status: DC
  Administered 2014-06-29: 20000 [IU] via SUBCUTANEOUS

## 2014-06-30 ENCOUNTER — Encounter: Payer: Self-pay | Admitting: Vascular Surgery

## 2014-07-01 ENCOUNTER — Ambulatory Visit (INDEPENDENT_AMBULATORY_CARE_PROVIDER_SITE_OTHER): Payer: Self-pay | Admitting: Vascular Surgery

## 2014-07-01 ENCOUNTER — Encounter: Payer: Self-pay | Admitting: Vascular Surgery

## 2014-07-01 ENCOUNTER — Ambulatory Visit (HOSPITAL_COMMUNITY)
Admission: RE | Admit: 2014-07-01 | Discharge: 2014-07-01 | Disposition: A | Discharge status: Home / Self Care | Payer: Medicare Other | Source: Ambulatory Visit | Attending: Vascular Surgery | Admitting: Vascular Surgery

## 2014-07-01 VITALS — BP 116/73 | HR 56 | Temp 97.9°F | Resp 18 | Ht 64.5 in | Wt 215.0 lb

## 2014-07-01 DIAGNOSIS — Z4931 Encounter for adequacy testing for hemodialysis: Secondary | ICD-10-CM

## 2014-07-01 DIAGNOSIS — Z4901 Encounter for fitting and adjustment of extracorporeal dialysis catheter: Secondary | ICD-10-CM | POA: Insufficient documentation

## 2014-07-01 DIAGNOSIS — N186 End stage renal disease: Secondary | ICD-10-CM | POA: Diagnosis not present

## 2014-07-01 NOTE — Progress Notes (Signed)
    Postoperative Access Visit   History of Present Illness  [name] is a 73 y.o. year old female who presents for postoperative follow-up for: left brachiocephalic AV fistula (Date: 05/31/2014).  She is not yet on dialysis. The patient's wounds are healed.  The patient denies any pain, cramping or numbness of her left hand or arm.   Physical Examination There were no vitals filed for this visit.  Cardiac: regular rate and rhythm Lungs: non labored breathing, clear to auscultation bilaterally LUE: Incision is healed, skin feels warm, hand grip is 5/5, sensation in digits is  intact, palpable thrill,  bruit can be auscultated   Medical Decision Making  [name] is a 73 y.o. year old female who presents s/p left brachiocephalic AV fistula [id number].   The patient is not yet on dialysis. She denies any steal symptoms. Her fistula is patent. It not yet mature. Her dialysis duplex today reveals fistula diameters ranging from 0.47 proximal to the anastomosis and 0.39 cm at the proximal upper arm. There is some narrowing approximately 2 cm in length at proximal to the anastomosis. There is a side branch in the mid upper arm. She will follow up in 6 weeks with duplex studies to evaluate maturation of her fistula.   Virgina Jock, PA-C [facility] Office: [phone]  07/01/2014, 2:22 PM   This patient was seen and examined in conjunction with Dr. Scot Dock.

## 2014-07-06 ENCOUNTER — Encounter (HOSPITAL_COMMUNITY): Payer: Medicare Other

## 2014-07-18 DIAGNOSIS — H4011X1 Primary open-angle glaucoma, mild stage: Secondary | ICD-10-CM | POA: Diagnosis not present

## 2014-07-18 DIAGNOSIS — E119 Type 2 diabetes mellitus without complications: Secondary | ICD-10-CM | POA: Diagnosis not present

## 2014-07-18 DIAGNOSIS — Z961 Presence of intraocular lens: Secondary | ICD-10-CM | POA: Diagnosis not present

## 2014-07-18 DIAGNOSIS — H1132 Conjunctival hemorrhage, left eye: Secondary | ICD-10-CM | POA: Diagnosis not present

## 2014-07-19 ENCOUNTER — Encounter: Payer: Self-pay | Admitting: Vascular Surgery

## 2014-07-20 ENCOUNTER — Other Ambulatory Visit (HOSPITAL_COMMUNITY): Payer: Medicare Other

## 2014-07-20 ENCOUNTER — Ambulatory Visit: Payer: Medicare Other | Admitting: Vascular Surgery

## 2014-07-20 ENCOUNTER — Inpatient Hospital Stay (HOSPITAL_COMMUNITY): Admission: RE | Admit: 2014-07-20 | Payer: Medicare Other | Source: Ambulatory Visit

## 2014-07-22 ENCOUNTER — Telehealth: Payer: Self-pay

## 2014-07-22 ENCOUNTER — Encounter (INDEPENDENT_AMBULATORY_CARE_PROVIDER_SITE_OTHER): Payer: Self-pay

## 2014-07-22 ENCOUNTER — Encounter: Payer: Self-pay | Admitting: Family

## 2014-07-22 ENCOUNTER — Ambulatory Visit (INDEPENDENT_AMBULATORY_CARE_PROVIDER_SITE_OTHER): Payer: Self-pay | Admitting: Family

## 2014-07-22 VITALS — BP 131/64 | HR 62 | Temp 96.9°F | Resp 14 | Ht 64.5 in | Wt 212.0 lb

## 2014-07-22 DIAGNOSIS — I770 Arteriovenous fistula, acquired: Secondary | ICD-10-CM

## 2014-07-22 DIAGNOSIS — N186 End stage renal disease: Secondary | ICD-10-CM

## 2014-07-22 NOTE — Telephone Encounter (Signed)
Rec'd phone call from pt's daughter.  Reported pt. has an area at inner aspect of left upper arm incision, that is red and raised approx. "1/2 penny in size."  Stated the area is soft, non-tender.  Reported she noticed it 2 days ago.  Denies drainage, fever or chills. Discussed w/ nurse practitioner.  Will bring pt. In for eval. today @ 3:30 PM.  Pt. notified of appt.  Agrees w/ plan.

## 2014-07-22 NOTE — Progress Notes (Signed)
    Postoperative Access Visit   History of Present Illness  [name] is a 74 y.o. year old female who is s/p left brachiocephalic AV fistula (Date: 05/31/2014). She is not yet on dialysis. She was last seen by Dr. Scot Dock and K. Tonita Cong in December 2015 and a Duplex of the graft was performed. At that time the fistula was patent but not yet mature. Her dialysis duplex at that time revealed fistula diameters ranging from 0.47 proximal to the anastomosis and 0.39 cm at the proximal upper arm. There was some narrowing approximately 2 cm in length at proximal to the anastomosis. There was a side branch in the mid upper arm. She was to follow up in 6 weeks with duplex studies to evaluate maturation of her fistula.  She returns today for daughter's concern re small raised area of AVF incision.  Pt denies fever of chills, denies steal symptoms in left arm.  The patient's wounds are healed, no evidence of infection nor aneurysm. The patient is able to complete their activities of daily living.    For VQI Use Only  PRE-ADM LIVING: Home  AMB STATUS: Ambulatory  Physical Examination Filed Vitals:   07/22/14 1540  BP: 131/64  Pulse: 62  Temp: 96.9 F (36.1 C)  Resp: 14  Height: 5' 4.5" (1.638 m)  Weight: 212 lb (96.163 kg)   Body mass index is 35.84 kg/(m^2).  Left UE: Incision is healed, skin feels warm, hand grip is 5/5, sensation in digits is intact, thrill not palpable, bruit can be auscultated. No erythema, no aneurysm or thinning of skin over AVF. Appears to be a small low grade suture abscess.  Medical Decision Making  [name] is a 74 y.o. year old female who presents s/p left brachiocephalic AV fistula (Date: 05/31/2014). Follow up as scheduled with Dr. Scot Dock early next month.   Adonia Porada, Sharmon Leyden, RN, MSN, FNP-C [facility] Office: [phone]  07/22/2014, 3:52 PM  Clinic MD: Bridgett Larsson

## 2014-07-22 NOTE — Patient Instructions (Signed)
Vascular Access for Hemodialysis A vascular access is a connection between two blood vessels that allows blood to be easily removed from the body and returned to the body during hemodialysis. Hemodialysis is a procedure in which a machine outside of the body filters the blood. There are three types of vascular accesses:   Arteriovenous fistula. This is a connection between an artery and a vein (usually in the arm) that is made by sewing them together. Blood in the artery flows directly into the vein, causing it to get larger over time. This makes it easier for the vein to be used for hemodialysis. An arteriovenous fistula takes 1-6 months to develop after surgery.   Arteriovenous graft. This is a connection between an artery and a vein in the arm that is made with a tube. An arteriovenous graft can be used within 2-3 weeks of surgery.   Venous catheter. This is a thin, flexible tube that is placed in a large vein (usually in the neck, chest, or groin). A venous catheter for hemodialysis contains two tubes that come out of the skin. A venous catheter can be used right away. It is usually used as a temporary access if you need hemodialysis before a fistula or graft has developed. It may also be used as a permanent access if a fistula or graft cannot be created. WHICH TYPE OF ACCESS IS BEST FOR ME? The type of access that is best for you depends on the size and strength of your veins.  A fistula is usually the preferred type of access. It can last several years and is less likely than the other types of accesses to become infected or to cause blood clots within a blood vessel (thrombosis). However, a fistula is not an option for everyone. If your veins are not the right size, a graft may be used instead. Grafts require you to have strong veins. If your veins are not strong enough for a graft, a catheter may be used. Catheters are more likely than fistulas and grafts to become infected or to have thrombosis.   Sometimes, only one type of access is an option. Your health care provider will help you determine which type of access is best for you.  HOW IS A VASCULAR ACCESS USED? The way the access is used depends on the type of access:   If the access is a fistula or graft, two needles are inserted through the skin into the access before each hemodialysis session. Blood leaves the body through one of the needles and travels through a tube to the hemodialysis machine (dialyzer). It then flows through another tube and returns to the body through the second needle.   If the access is a catheter, one tube is connected directly to the tube that leads to the dialyzer and the other is connected to a tube that leads away from the dialyzer. Blood leaves the body through one tube and returns to the body through the other.  WHAT KIND OF PROBLEMS CAN OCCUR WITH VASCULAR ACCESSES?  Blood clots within a blood vessel (thrombosis). Thrombosis can lead to a narrowing of a blood vessel or tube (stenosis). If thrombosis occurs frequently, another access site may be created as a backup.   Infection.  These problems are most likely to occur with a venous catheter and least likely to occur with an arteriovenous fistula.  HOW DO I CARE FOR MY VASCULAR ACCESS? Wear a medical alert bracelet. This tells health care providers that you are   a dialysis patient in the case of an emergency and allows them to care for your veins appropriately. If you have a graft or fistula:   A "bruit" is a noise that is heard with a stethoscope and a "thrill" is a vibration felt over the graft or fistula. The presence of the bruit and thrill indicates that the access is working. You will be taught to feel for the thrill each day. If this is not felt, the access may be clotted. Call your health care provider.   You may use the arm where your vascular access is located freely after the site heals. Keep the following in mind:   Avoid pressure on  the arm.   Avoid lifting heavy objects with the arm.   Avoid sleeping on the arm.   Avoid wearing tight-sleeved shirts or jewelry around the graft or fistula.   Do not allow blood pressure monitoring or needle punctures on the side where the graft or fistula is located.   With permission from your health care provider, you may do exercises to help with blood flow through a fistula. These exercises involve squeezing a rubber ball or other soft objects as instructed. SEEK MEDICAL CARE IF:   Chills develop.   You have an oral temperature above 102 F (38.9 C).  Swelling around the graft or fistula gets worse.   New pain develops.   Pus or other fluid (drainage) is seen at the vascular access site.   Skin redness or red streaking is seen on the skin around, above, or below the vascular access. SEEK IMMEDIATE MEDICAL CARE IF:   Pain, numbness, or an unusual pale skin color develops in the hand on the side of your fistula.   Dizziness or weakness develops that you have not had before.   The vascular access has bleeding that cannot be easily controlled. Document Released: 09/21/2002 Document Revised: 11/15/2013 Document Reviewed: 11/17/2012 [employer] Patient Information 2015 [employer]. This information is not intended to replace advice given to you by your health care provider. Make sure you discuss any questions you have with your health care provider.  

## 2014-07-27 ENCOUNTER — Encounter (HOSPITAL_COMMUNITY)
Admission: RE | Admit: 2014-07-27 | Discharge: 2014-07-27 | Disposition: A | Discharge status: Home / Self Care | Payer: Medicare Other | Source: Ambulatory Visit | Attending: Nephrology | Admitting: Nephrology

## 2014-07-27 DIAGNOSIS — N184 Chronic kidney disease, stage 4 (severe): Secondary | ICD-10-CM | POA: Insufficient documentation

## 2014-07-27 DIAGNOSIS — D631 Anemia in chronic kidney disease: Secondary | ICD-10-CM | POA: Insufficient documentation

## 2014-07-27 LAB — POCT HEMOGLOBIN-HEMACUE: Hemoglobin: 11.3 g/dL — ABNORMAL LOW (ref 12.0–15.0)

## 2014-07-27 LAB — FERRITIN: FERRITIN: 408 ng/mL — AB (ref 10–291)

## 2014-07-27 MED ORDER — EPOETIN ALFA 20000 UNIT/ML IJ SOLN
INTRAMUSCULAR | Status: AC
  Filled 2014-07-27: qty 1

## 2014-07-27 MED ORDER — EPOETIN ALFA 20000 UNIT/ML IJ SOLN
20000.0000 [IU] | INTRAMUSCULAR | Status: DC
  Administered 2014-07-27: 20000 [IU] via SUBCUTANEOUS

## 2014-07-28 LAB — IRON AND TIBC
IRON: 74 ug/dL (ref 42–145)
Saturation Ratios: 26 % (ref 20–55)
TIBC: 290 ug/dL (ref 250–470)
UIBC: 216 ug/dL (ref 125–400)

## 2014-08-02 DIAGNOSIS — E11311 Type 2 diabetes mellitus with unspecified diabetic retinopathy with macular edema: Secondary | ICD-10-CM | POA: Diagnosis not present

## 2014-08-02 DIAGNOSIS — E13359 Other specified diabetes mellitus with proliferative diabetic retinopathy without macular edema: Secondary | ICD-10-CM | POA: Diagnosis not present

## 2014-08-02 DIAGNOSIS — E1139 Type 2 diabetes mellitus with other diabetic ophthalmic complication: Secondary | ICD-10-CM | POA: Diagnosis not present

## 2014-08-16 ENCOUNTER — Encounter: Payer: Self-pay | Admitting: Vascular Surgery

## 2014-08-17 ENCOUNTER — Ambulatory Visit (HOSPITAL_COMMUNITY)
Admission: RE | Admit: 2014-08-17 | Discharge: 2014-08-17 | Disposition: A | Discharge status: Home / Self Care | Payer: Medicare Other | Source: Ambulatory Visit | Attending: Vascular Surgery | Admitting: Vascular Surgery

## 2014-08-17 ENCOUNTER — Ambulatory Visit (INDEPENDENT_AMBULATORY_CARE_PROVIDER_SITE_OTHER)
Admission: RE | Admit: 2014-08-17 | Discharge: 2014-08-17 | Disposition: A | Discharge status: Home / Self Care | Payer: Medicare Other | Source: Ambulatory Visit | Attending: Vascular Surgery | Admitting: Vascular Surgery

## 2014-08-17 ENCOUNTER — Ambulatory Visit (INDEPENDENT_AMBULATORY_CARE_PROVIDER_SITE_OTHER): Payer: Self-pay | Admitting: Vascular Surgery

## 2014-08-17 ENCOUNTER — Encounter (HOSPITAL_COMMUNITY)
Admission: RE | Admit: 2014-08-17 | Discharge: 2014-08-17 | Disposition: A | Discharge status: Home / Self Care | Payer: Medicare Other | Source: Ambulatory Visit | Attending: Nephrology | Admitting: Nephrology

## 2014-08-17 ENCOUNTER — Encounter: Payer: Self-pay | Admitting: Vascular Surgery

## 2014-08-17 VITALS — BP 133/49 | HR 66 | Ht 64.5 in | Wt 205.9 lb

## 2014-08-17 DIAGNOSIS — N184 Chronic kidney disease, stage 4 (severe): Secondary | ICD-10-CM

## 2014-08-17 DIAGNOSIS — N186 End stage renal disease: Secondary | ICD-10-CM | POA: Diagnosis not present

## 2014-08-17 DIAGNOSIS — Z4931 Encounter for adequacy testing for hemodialysis: Secondary | ICD-10-CM | POA: Diagnosis not present

## 2014-08-17 DIAGNOSIS — D631 Anemia in chronic kidney disease: Secondary | ICD-10-CM | POA: Insufficient documentation

## 2014-08-17 DIAGNOSIS — I6523 Occlusion and stenosis of bilateral carotid arteries: Secondary | ICD-10-CM

## 2014-08-17 DIAGNOSIS — Z48812 Encounter for surgical aftercare following surgery on the circulatory system: Secondary | ICD-10-CM

## 2014-08-17 LAB — POCT HEMOGLOBIN-HEMACUE: Hemoglobin: 11.7 g/dL — ABNORMAL LOW (ref 12.0–15.0)

## 2014-08-17 MED ORDER — EPOETIN ALFA 20000 UNIT/ML IJ SOLN
INTRAMUSCULAR | Status: AC
  Filled 2014-08-17: qty 1

## 2014-08-17 MED ORDER — EPOETIN ALFA 20000 UNIT/ML IJ SOLN
20000.0000 [IU] | INTRAMUSCULAR | Status: DC
  Administered 2014-08-17: 20000 [IU] via SUBCUTANEOUS

## 2014-08-17 NOTE — Progress Notes (Signed)
   Patient name: [name] MRN: [id number] DOB: July 30, 1940 Sex: female  REASON FOR VISIT: Follow up of left brachiocephalic AV fistula and follow up of carotid disease  HPI: [name] is a 74 y.o. female who had a left brachiocephalic AV fistula placed on 05/31/2014. She comes in for a follow up visit. She is not yet on dialysis. She denies any recent uremic symptoms. Specifically, she denies nausea, vomiting, fatigue, anorexia, or palpitations. She's had no pain or paresthesias in her left upper extremity.  In addition, I have been following her with carotid disease. She had a left carotid endarterectomy in 2009. I have been following a moderate right carotid stenosis. Since I saw her last, she denies any history of stroke, TIAs, expressive or receptive aphasia, or amaurosis fugax.  REVIEW OF SYSTEMS: Valu.Nieves ] denotes positive finding; [  ] denotes negative finding  CARDIOVASCULAR:  [ ]  chest pain   [ ]  dyspnea on exertion    CONSTITUTIONAL:  [ ]  fever   [ ]  chills  PHYSICAL EXAM: Filed Vitals:   08/17/14 1556  BP: 133/49  Pulse: 66  Height: 5' 4.5" (1.638 m)  Weight: 205 lb 14.4 oz (93.396 kg)  SpO2: 98%   Body mass index is 34.81 kg/(m^2). GENERAL: The patient is a well-nourished female, in no acute distress. The vital signs are documented above. CARDIOVASCULAR: There is a regular rate and rhythm. She has a right carotid bruit.  PULMONARY: There is good air exchange bilaterally without wheezing or rales. She has a weak thrill in her left upper arm brachiocephalic fistula. She has a palpable left radial pulse.  I have independently interpreted her carotid duplex scan which shows that her left carotid endarterectomy site is widely patent. There is no evidence of restenosis. On the right side she has a 6079% proximal internal carotid artery stenosis. This is stable compared to 6 months ago.  I have independently interpreted her duplex of her left brachiocephalic AV fistula. This  shows that the diameters of her fistula range from 0.22 cm-0.43 cm.  MEDICAL ISSUES:  STAGE IV CHRONIC KIDNEY DISEASE: Her fistula is not maturing adequately. For this reason I have recommended that we proceed with a fistulogram. I have discussed the procedure and potential complications. I have explained that if we see disease amenable to venoplasty we could proceed at the same time. If the fistula does not appear to be salvageable, then we would need to consider new access. She is not yet on dialysis.  RIGHT CAROTID STENOSIS: The patient has an asymptomatic 60-79% right carotid stenosis. She understands we would not consider right carotid endarterectomy unless the stenosis progressed to greater than 80% or she developed new right hemispheric symptoms. He has no evidence of recurrent carotid stenosis on the left where she's had previous left carotid endarterectomy in 2009. I've ordered a follow carotid duplex scan in 6 months and I'll see her back at that time. She knows to call sooner if she has problems. In the meantime she is to continue taking her aspirin.  [place] [facility] [place] [facility]: [phone]

## 2014-08-18 ENCOUNTER — Other Ambulatory Visit: Payer: Self-pay

## 2014-08-18 NOTE — Addendum Note (Signed)
Addended by: Dorthula Rue L on: 08/18/2014 03:34 PM   Modules accepted: Orders

## 2014-08-21 ENCOUNTER — Other Ambulatory Visit: Payer: Self-pay | Admitting: Interventional Cardiology

## 2014-08-22 DIAGNOSIS — N2581 Secondary hyperparathyroidism of renal origin: Secondary | ICD-10-CM | POA: Diagnosis not present

## 2014-08-22 DIAGNOSIS — D631 Anemia in chronic kidney disease: Secondary | ICD-10-CM | POA: Diagnosis not present

## 2014-08-22 DIAGNOSIS — I129 Hypertensive chronic kidney disease with stage 1 through stage 4 chronic kidney disease, or unspecified chronic kidney disease: Secondary | ICD-10-CM | POA: Diagnosis not present

## 2014-08-22 DIAGNOSIS — N184 Chronic kidney disease, stage 4 (severe): Secondary | ICD-10-CM | POA: Diagnosis not present

## 2014-08-24 ENCOUNTER — Encounter (HOSPITAL_COMMUNITY): Payer: Self-pay | Admitting: Pharmacy Technician

## 2014-08-29 ENCOUNTER — Other Ambulatory Visit: Payer: Self-pay | Admitting: *Deleted

## 2014-08-29 ENCOUNTER — Ambulatory Visit (HOSPITAL_COMMUNITY)
Admission: RE | Admit: 2014-08-29 | Discharge: 2014-08-29 | Disposition: A | Discharge status: Home / Self Care | Payer: Medicare Other | Source: Ambulatory Visit | Attending: Vascular Surgery | Admitting: Vascular Surgery

## 2014-08-29 ENCOUNTER — Encounter (HOSPITAL_COMMUNITY): Payer: Self-pay | Admitting: Vascular Surgery

## 2014-08-29 ENCOUNTER — Encounter (HOSPITAL_COMMUNITY)
Admission: RE | Disposition: A | Discharge status: Home / Self Care | Payer: Self-pay | Source: Ambulatory Visit | Attending: Vascular Surgery

## 2014-08-29 DIAGNOSIS — N184 Chronic kidney disease, stage 4 (severe): Secondary | ICD-10-CM | POA: Insufficient documentation

## 2014-08-29 DIAGNOSIS — N186 End stage renal disease: Secondary | ICD-10-CM | POA: Diagnosis present

## 2014-08-29 DIAGNOSIS — T82898A Other specified complication of vascular prosthetic devices, implants and grafts, initial encounter: Secondary | ICD-10-CM | POA: Diagnosis not present

## 2014-08-29 HISTORY — PX: FISTULOGRAM: SHX5832

## 2014-08-29 LAB — POCT I-STAT, CHEM 8
BUN: 125 mg/dL — AB (ref 6–23)
CHLORIDE: 102 mmol/L (ref 96–112)
CREATININE: 3 mg/dL — AB (ref 0.50–1.10)
Calcium, Ion: 1.15 mmol/L (ref 1.13–1.30)
GLUCOSE: 176 mg/dL — AB (ref 70–99)
HCT: 39 % (ref 36.0–46.0)
Hemoglobin: 13.3 g/dL (ref 12.0–15.0)
Potassium: 4.6 mmol/L (ref 3.5–5.1)
SODIUM: 139 mmol/L (ref 135–145)
TCO2: 22 mmol/L (ref 0–100)

## 2014-08-29 SURGERY — FISTULOGRAM
Laterality: Left

## 2014-08-29 MED ORDER — MIDAZOLAM HCL 2 MG/2ML IJ SOLN
INTRAMUSCULAR | Status: AC
  Filled 2014-08-29: qty 2

## 2014-08-29 MED ORDER — HEPARIN (PORCINE) IN NACL 2-0.9 UNIT/ML-% IJ SOLN
INTRAMUSCULAR | Status: AC
  Filled 2014-08-29: qty 500

## 2014-08-29 MED ORDER — LIDOCAINE HCL (PF) 1 % IJ SOLN
INTRAMUSCULAR | Status: AC
  Filled 2014-08-29: qty 30

## 2014-08-29 MED ORDER — SODIUM CHLORIDE 0.9 % IV SOLN
INTRAVENOUS | Status: DC
  Administered 2014-08-29: 06:00:00 via INTRAVENOUS

## 2014-08-29 MED ORDER — FENTANYL CITRATE 0.05 MG/ML IJ SOLN
INTRAMUSCULAR | Status: AC
  Filled 2014-08-29: qty 2

## 2014-08-29 NOTE — H&P (View-Only) (Signed)
   Patient name: [name] MRN: [id number] DOB: 09-12-1940 Sex: female  REASON FOR VISIT: Follow up of left brachiocephalic AV fistula and follow up of carotid disease  HPI: [name] is a 74 y.o. female who had a left brachiocephalic AV fistula placed on 05/31/2014. She comes in for a follow up visit. She is not yet on dialysis. She denies any recent uremic symptoms. Specifically, she denies nausea, vomiting, fatigue, anorexia, or palpitations. She's had no pain or paresthesias in her left upper extremity.  In addition, I have been following her with carotid disease. She had a left carotid endarterectomy in 2009. I have been following a moderate right carotid stenosis. Since I saw her last, she denies any history of stroke, TIAs, expressive or receptive aphasia, or amaurosis fugax.  REVIEW OF SYSTEMS: Valu.Nieves ] denotes positive finding; [  ] denotes negative finding  CARDIOVASCULAR:  [ ]  chest pain   [ ]  dyspnea on exertion    CONSTITUTIONAL:  [ ]  fever   [ ]  chills  PHYSICAL EXAM: Filed Vitals:   08/17/14 1556  BP: 133/49  Pulse: 66  Height: 5' 4.5" (1.638 m)  Weight: 205 lb 14.4 oz (93.396 kg)  SpO2: 98%   Body mass index is 34.81 kg/(m^2). GENERAL: The patient is a well-nourished female, in no acute distress. The vital signs are documented above. CARDIOVASCULAR: There is a regular rate and rhythm. She has a right carotid bruit.  PULMONARY: There is good air exchange bilaterally without wheezing or rales. She has a weak thrill in her left upper arm brachiocephalic fistula. She has a palpable left radial pulse.  I have independently interpreted her carotid duplex scan which shows that her left carotid endarterectomy site is widely patent. There is no evidence of restenosis. On the right side she has a 6079% proximal internal carotid artery stenosis. This is stable compared to 6 months ago.  I have independently interpreted her duplex of her left brachiocephalic AV fistula. This  shows that the diameters of her fistula range from 0.22 cm-0.43 cm.  MEDICAL ISSUES:  STAGE IV CHRONIC KIDNEY DISEASE: Her fistula is not maturing adequately. For this reason I have recommended that we proceed with a fistulogram. I have discussed the procedure and potential complications. I have explained that if we see disease amenable to venoplasty we could proceed at the same time. If the fistula does not appear to be salvageable, then we would need to consider new access. She is not yet on dialysis.  RIGHT CAROTID STENOSIS: The patient has an asymptomatic 60-79% right carotid stenosis. She understands we would not consider right carotid endarterectomy unless the stenosis progressed to greater than 80% or she developed new right hemispheric symptoms. He has no evidence of recurrent carotid stenosis on the left where she's had previous left carotid endarterectomy in 2009. I've ordered a follow carotid duplex scan in 6 months and I'll see her back at that time. She knows to call sooner if she has problems. In the meantime she is to continue taking her aspirin.  [facility]: [phone]

## 2014-08-29 NOTE — Interval H&P Note (Signed)
History and Physical Interval Note:  08/29/2014 7:33 AM  [name]  has presented today for surgery, with the diagnosis of left arm/stage 4 cronic kidney disease  The various methods of treatment have been discussed with the patient and family. After consideration of risks, benefits and other options for treatment, the patient has consented to  Procedure(s): FISTULOGRAM (Left) as a surgical intervention .  The patient's history has been reviewed, patient examined, no change in status, stable for surgery.  I have reviewed the patient's chart and labs.  Questions were answered to the patient's satisfaction.     Keshia Weare S

## 2014-08-29 NOTE — Discharge Instructions (Signed)
Fistulogram, Care After °Refer to this sheet in the next few weeks. These instructions provide you with information on caring for yourself after your procedure. Your health care provider may also give you more specific instructions. Your treatment has been planned according to current medical practices, but problems sometimes occur. Call your health care provider if you have any problems or questions after your procedure. °WHAT TO EXPECT AFTER THE PROCEDURE °After your procedure, it is typical to have the following: °· A small amount of discomfort in the area where the catheters were placed. °· A small amount of bruising around the fistula. °· Sleepiness and fatigue. °HOME CARE INSTRUCTIONS °· Rest at home for the day following your procedure. °· Do not drive or operate heavy machinery while taking pain medicine. °· Take medicines only as directed by your health care provider. °· Do not take baths, swim, or use a hot tub until your health care provider approves. You may shower 24 hours after the procedure or as directed by your health care provider. °· There are many different ways to close and cover an incision, including stitches, skin glue, and adhesive strips. Follow your health care provider's instructions on: °¨ Incision care. °¨ Bandage (dressing) changes and removal. °¨ Incision closure removal. °· Monitor your dialysis fistula carefully. °SEEK MEDICAL CARE IF: °· You have drainage, redness, swelling, or pain at your catheter site. °· You have a fever. °· You have chills. °SEEK IMMEDIATE MEDICAL CARE IF: °· You feel weak. °· You have trouble balancing. °· You have trouble moving your arms or legs. °· You have problems with your speech or vision. °· You can no longer feel a vibration or buzz when you put your fingers over your dialysis fistula. °· The limb that was used for the procedure: °¨ Swells. °¨ Is painful. °¨ Is cold. °¨ Is discolored, such as blue or pale white. °Document Released: 11/15/2013  Document Reviewed: 08/20/2013 °Exit[employer]® Patient Information ©2015 [employer]. This information is not intended to replace advice given to you by your health care provider. Make sure you discuss any questions you have with your health care provider. ° °

## 2014-08-29 NOTE — Op Note (Signed)
   PATIENT: [name]   MRN: [id number] DOB: 12-Oct-1940    DATE OF PROCEDURE: 08/29/2014  INDICATIONS: CAILEEN SALAS is a 74 y.o. female who was seen in our office on 05/18/2014 for evaluation for hemodialysis access. Based on her vein map, it appeared that her only option for a fistula was her left upper arm cephalic vein which was fairly small. Dr. Mercy Moore felt that if a fistula could not be placed she would need an AV graft.  PROCEDURE:  1. Ultrasound-guided access to the left brachiocephalic AV fistula 2. Fistulogram left brachiocephalic AV fistula  SURGEON: Judeth Cornfield. Scot Dock, MD, FACS  ANESTHESIA: local with sedation   EBL: minimal  TECHNIQUE: The patient was taken to the peripheral vascular lab and her see 1 mg of Versed and 50 g of fentanyl. The left upper extremity was prepped and draped in usual sterile fashion. Under all shown guidance, after the skin was anesthetized, the upper arm cephalic vein was cannulated with a micropuncture needle. Given the very small size of the vein cannulation was difficult and this took 3 attempts. Once the wire advanced without difficulty and micropuncture sheath was placed over the wire. Fistulogram is then obtained using half-strength contrast. The fistula was evaluated from the site of cannulation to the central veins. In addition the fistula was compressed proximally and a retrograde shot was obtained to demonstrate proximal fistula. At the completion, the cannulation site was closed with a 4-0 Monocryl suture.  FINDINGS:  1. Patent left brachiocephalic fistula with multiple issues. 2. There is severe stenosis of the proximal 3 cm of the fistula. The artery itself looked somewhat small. 3. There are multiple competing branches. 4.  There are collaterals at the shoulder suggesting some stenosis where the cephalic vein enters the subclavian vein on the left. 5. The basilic vein is very small. 6. The brachial vein is patent. There is no  central venous stenosis.  CLINICAL NOTE: The patient will be scheduled for a left upper arm AV graft.  [name], MD, FACS [facility]  DATE OF DICTATION:   08/29/2014

## 2014-08-31 ENCOUNTER — Encounter (HOSPITAL_COMMUNITY): Payer: Self-pay | Admitting: *Deleted

## 2014-08-31 MED ORDER — SODIUM CHLORIDE 0.9 % IV SOLN
INTRAVENOUS | Status: DC
  Administered 2014-09-01: 09:00:00 via INTRAVENOUS

## 2014-08-31 MED ORDER — CHLORHEXIDINE GLUCONATE CLOTH 2 % EX PADS: 6.0000 | MEDICATED_PAD | Freq: Once | CUTANEOUS | Status: DC

## 2014-08-31 MED ORDER — DEXTROSE 5 % IV SOLN
1.5000 g | INTRAVENOUS | Status: AC
  Administered 2014-09-01: 1.5 g via INTRAVENOUS
  Filled 2014-08-31: qty 1.5

## 2014-08-31 NOTE — Progress Notes (Signed)
   08/31/14 1450  OBSTRUCTIVE SLEEP APNEA  Have you ever been diagnosed with sleep apnea through a sleep study? No  Do you snore loudly (loud enough to be heard through closed doors)?  1  Do you often feel tired, fatigued, or sleepy during the daytime? 1  Has anyone observed you stop breathing during your sleep? 0  Do you have, or are you being treated for high blood pressure? 1  BMI more than 35 kg/m2? 0  Age over 75 years old? 1  Gender: 0  Obstructive Sleep Apnea Score 4

## 2014-08-31 NOTE — Progress Notes (Signed)
Pt denies SOB and chest pain and is under the care of Dr. Irish Lack, cardiology. Pt made aware to not take any diabetic medications the morning of procedure. Pt made aware to stop taking fish oil and vitamins for procedure. Pt verbalized understanding.

## 2014-09-01 ENCOUNTER — Ambulatory Visit (HOSPITAL_COMMUNITY)
Admission: RE | Admit: 2014-09-01 | Discharge: 2014-09-01 | Disposition: A | Discharge status: Home / Self Care | Payer: Medicare Other | Source: Ambulatory Visit | Attending: Vascular Surgery | Admitting: Vascular Surgery

## 2014-09-01 ENCOUNTER — Encounter (HOSPITAL_COMMUNITY)
Admission: RE | Disposition: A | Discharge status: Home / Self Care | Payer: Self-pay | Source: Ambulatory Visit | Attending: Vascular Surgery

## 2014-09-01 ENCOUNTER — Ambulatory Visit (HOSPITAL_COMMUNITY): Payer: Medicare Other | Admitting: Vascular Surgery

## 2014-09-01 ENCOUNTER — Encounter (HOSPITAL_COMMUNITY): Payer: Self-pay | Admitting: *Deleted

## 2014-09-01 DIAGNOSIS — E119 Type 2 diabetes mellitus without complications: Secondary | ICD-10-CM | POA: Diagnosis not present

## 2014-09-01 DIAGNOSIS — N184 Chronic kidney disease, stage 4 (severe): Secondary | ICD-10-CM | POA: Insufficient documentation

## 2014-09-01 DIAGNOSIS — Z992 Dependence on renal dialysis: Secondary | ICD-10-CM | POA: Diagnosis not present

## 2014-09-01 DIAGNOSIS — I6521 Occlusion and stenosis of right carotid artery: Secondary | ICD-10-CM | POA: Diagnosis not present

## 2014-09-01 DIAGNOSIS — Z8673 Personal history of transient ischemic attack (TIA), and cerebral infarction without residual deficits: Secondary | ICD-10-CM | POA: Insufficient documentation

## 2014-09-01 DIAGNOSIS — I129 Hypertensive chronic kidney disease with stage 1 through stage 4 chronic kidney disease, or unspecified chronic kidney disease: Secondary | ICD-10-CM | POA: Diagnosis not present

## 2014-09-01 DIAGNOSIS — T82898A Other specified complication of vascular prosthetic devices, implants and grafts, initial encounter: Secondary | ICD-10-CM | POA: Diagnosis not present

## 2014-09-01 DIAGNOSIS — N186 End stage renal disease: Secondary | ICD-10-CM | POA: Diagnosis not present

## 2014-09-01 DIAGNOSIS — K219 Gastro-esophageal reflux disease without esophagitis: Secondary | ICD-10-CM | POA: Diagnosis not present

## 2014-09-01 DIAGNOSIS — I2510 Atherosclerotic heart disease of native coronary artery without angina pectoris: Secondary | ICD-10-CM | POA: Diagnosis not present

## 2014-09-01 HISTORY — PX: AV FISTULA PLACEMENT: SHX1204

## 2014-09-01 HISTORY — DX: Occlusion and stenosis of unspecified carotid artery: I65.29

## 2014-09-01 LAB — GLUCOSE, CAPILLARY
GLUCOSE-CAPILLARY: 128 mg/dL — AB (ref 70–99)
GLUCOSE-CAPILLARY: 120 mg/dL — AB (ref 70–99)
GLUCOSE-CAPILLARY: 139 mg/dL — AB (ref 70–99)

## 2014-09-01 LAB — POCT I-STAT 4, (NA,K, GLUC, HGB,HCT)
GLUCOSE: 128 mg/dL — AB (ref 70–99)
HCT: 37 % (ref 36.0–46.0)
Hemoglobin: 12.6 g/dL (ref 12.0–15.0)
Potassium: 3.9 mmol/L (ref 3.5–5.1)
SODIUM: 139 mmol/L (ref 135–145)

## 2014-09-01 SURGERY — INSERTION OF ARTERIOVENOUS (AV) GORE-TEX GRAFT ARM
Anesthesia: General | Site: Arm Upper | Laterality: Left

## 2014-09-01 MED ORDER — HEPARIN SODIUM (PORCINE) 1000 UNIT/ML IJ SOLN
INTRAMUSCULAR | Status: AC
  Filled 2014-09-01: qty 2

## 2014-09-01 MED ORDER — FENTANYL CITRATE 0.05 MG/ML IJ SOLN
INTRAMUSCULAR | Status: AC
  Filled 2014-09-01: qty 5

## 2014-09-01 MED ORDER — ONDANSETRON HCL 4 MG/2ML IJ SOLN
INTRAMUSCULAR | Status: DC | PRN
  Administered 2014-09-01: 4 mg via INTRAVENOUS

## 2014-09-01 MED ORDER — 0.9 % SODIUM CHLORIDE (POUR BTL) OPTIME
TOPICAL | Status: DC | PRN
  Administered 2014-09-01: 1000 mL

## 2014-09-01 MED ORDER — PROTAMINE SULFATE 10 MG/ML IV SOLN
INTRAVENOUS | Status: DC | PRN
  Administered 2014-09-01: 30 mg via INTRAVENOUS
  Administered 2014-09-01: 10 mg via INTRAVENOUS

## 2014-09-01 MED ORDER — FENTANYL CITRATE 0.05 MG/ML IJ SOLN: 25.0000 ug | INTRAMUSCULAR | Status: DC | PRN

## 2014-09-01 MED ORDER — SODIUM CHLORIDE 0.9 % IR SOLN
Status: DC | PRN
  Administered 2014-09-01: 500 mL

## 2014-09-01 MED ORDER — LIDOCAINE HCL (PF) 1 % IJ SOLN
INTRAMUSCULAR | Status: DC | PRN
  Administered 2014-09-01: 9 mL

## 2014-09-01 MED ORDER — PROPOFOL 10 MG/ML IV BOLUS
INTRAVENOUS | Status: AC
  Filled 2014-09-01: qty 20

## 2014-09-01 MED ORDER — PROPOFOL 10 MG/ML IV BOLUS
INTRAVENOUS | Status: DC | PRN
  Administered 2014-09-01: 170 mg via INTRAVENOUS

## 2014-09-01 MED ORDER — OXYCODONE HCL 5 MG PO TABS: 5.0000 mg | ORAL_TABLET | Freq: Four times a day (QID) | ORAL | Status: DC | PRN

## 2014-09-01 MED ORDER — LIDOCAINE HCL (PF) 1 % IJ SOLN
INTRAMUSCULAR | Status: AC
  Filled 2014-09-01: qty 30

## 2014-09-01 MED ORDER — OXYCODONE HCL 5 MG/5ML PO SOLN: 5.0000 mg | Freq: Once | ORAL | Status: DC | PRN

## 2014-09-01 MED ORDER — LIDOCAINE HCL (CARDIAC) 20 MG/ML IV SOLN
INTRAVENOUS | Status: DC | PRN
  Administered 2014-09-01: 20 mg via INTRAVENOUS

## 2014-09-01 MED ORDER — THROMBIN 20000 UNITS EX SOLR
CUTANEOUS | Status: AC
  Filled 2014-09-01: qty 20000

## 2014-09-01 MED ORDER — OXYCODONE HCL 5 MG PO TABS: 5.0000 mg | ORAL_TABLET | Freq: Once | ORAL | Status: DC | PRN

## 2014-09-01 MED ORDER — PROTAMINE SULFATE 10 MG/ML IV SOLN
INTRAVENOUS | Status: AC
  Filled 2014-09-01: qty 10

## 2014-09-01 MED ORDER — ONDANSETRON HCL 4 MG/2ML IJ SOLN: 4.0000 mg | Freq: Once | INTRAMUSCULAR | Status: DC | PRN

## 2014-09-01 MED ORDER — HEPARIN SODIUM (PORCINE) 1000 UNIT/ML IJ SOLN
INTRAMUSCULAR | Status: DC | PRN
Start: 2014-09-01 — End: 2014-09-01
  Administered 2014-09-01: 8000 [IU] via INTRAVENOUS

## 2014-09-01 MED ORDER — LIDOCAINE-EPINEPHRINE (PF) 1 %-1:200000 IJ SOLN
INTRAMUSCULAR | Status: AC
  Filled 2014-09-01: qty 10

## 2014-09-01 MED ORDER — FENTANYL CITRATE 0.05 MG/ML IJ SOLN
INTRAMUSCULAR | Status: DC | PRN
  Administered 2014-09-01: 50 ug via INTRAVENOUS

## 2014-09-01 MED ORDER — THROMBIN 20000 UNITS EX SOLR
CUTANEOUS | Status: DC | PRN
  Administered 2014-09-01: 13:00:00 via TOPICAL

## 2014-09-01 SURGICAL SUPPLY — 35 items
ARMBAND PINK RESTRICT EXTREMIT (MISCELLANEOUS) ×3 IMPLANT
CANISTER SUCTION 2500CC (MISCELLANEOUS) ×3 IMPLANT
CANNULA VESSEL 3MM 2 BLNT TIP (CANNULA) ×3 IMPLANT
CLIP TI MEDIUM 6 (CLIP) ×9 IMPLANT
CLIP TI WIDE RED SMALL 6 (CLIP) ×6 IMPLANT
DECANTER SPIKE VIAL GLASS SM (MISCELLANEOUS) ×3 IMPLANT
ELECT REM PT RETURN 9FT ADLT (ELECTROSURGICAL) ×3
ELECTRODE REM PT RTRN 9FT ADLT (ELECTROSURGICAL) ×1 IMPLANT
GLOVE BIO SURGEON STRL SZ 6.5 (GLOVE) ×8 IMPLANT
GLOVE BIO SURGEON STRL SZ7.5 (GLOVE) ×6 IMPLANT
GLOVE BIO SURGEONS STRL SZ 6.5 (GLOVE) ×4
GLOVE BIOGEL PI IND STRL 6.5 (GLOVE) ×3 IMPLANT
GLOVE BIOGEL PI IND STRL 7.5 (GLOVE) ×1 IMPLANT
GLOVE BIOGEL PI IND STRL 8 (GLOVE) ×1 IMPLANT
GLOVE BIOGEL PI INDICATOR 6.5 (GLOVE) ×6
GLOVE BIOGEL PI INDICATOR 7.5 (GLOVE) ×2
GLOVE BIOGEL PI INDICATOR 8 (GLOVE) ×2
GOWN STRL REUS W/ TWL LRG LVL3 (GOWN DISPOSABLE) ×4 IMPLANT
GOWN STRL REUS W/TWL LRG LVL3 (GOWN DISPOSABLE) ×8
GRAFT GORETEX STRT 4-7X45 (Vascular Products) ×3 IMPLANT
KIT BASIN OR (CUSTOM PROCEDURE TRAY) ×3 IMPLANT
KIT ROOM TURNOVER OR (KITS) ×3 IMPLANT
LIQUID BAND (GAUZE/BANDAGES/DRESSINGS) ×3 IMPLANT
NS IRRIG 1000ML POUR BTL (IV SOLUTION) ×3 IMPLANT
PACK CV ACCESS (CUSTOM PROCEDURE TRAY) ×3 IMPLANT
PAD ARMBOARD 7.5X6 YLW CONV (MISCELLANEOUS) ×6 IMPLANT
PROBE PENCIL 8 MHZ STRL DISP (MISCELLANEOUS) IMPLANT
SPONGE LAP 18X18 X RAY DECT (DISPOSABLE) ×3 IMPLANT
SPONGE SURGIFOAM ABS GEL 100 (HEMOSTASIS) IMPLANT
SUT PROLENE 6 0 BV (SUTURE) ×12 IMPLANT
SUT VIC AB 3-0 SH 27 (SUTURE) ×4
SUT VIC AB 3-0 SH 27X BRD (SUTURE) ×2 IMPLANT
SUT VICRYL 4-0 PS2 18IN ABS (SUTURE) ×6 IMPLANT
UNDERPAD 30X30 INCONTINENT (UNDERPADS AND DIAPERS) ×3 IMPLANT
WATER STERILE IRR 1000ML POUR (IV SOLUTION) ×3 IMPLANT

## 2014-09-01 NOTE — Transfer of Care (Signed)
Immediate Anesthesia Transfer of Care Note  Patient: [name]  Procedure(s) Performed: Procedure(s): INSERTION OF ARTERIOVENOUS (AV) GORE-TEX GRAFT LEFT UPPER ARM USING  4-7 MM X 45 CM SRTETCH GORETEX GRAFT (Left)  Patient Location: PACU  Anesthesia Type:General  Level of Consciousness: awake, alert  and oriented  Airway & Oxygen Therapy: Patient Spontanous Breathing and Patient connected to nasal cannula oxygen  Post-op Assessment: Report given to RN and Post -op Vital signs reviewed and stable  Post vital signs: Reviewed and stable  Last Vitals:  Filed Vitals:   09/01/14 0815  BP: 131/37  Pulse: 55  Temp: 37 C  Resp: 20    Complications: No apparent anesthesia complications

## 2014-09-01 NOTE — Anesthesia Postprocedure Evaluation (Signed)
  Anesthesia Post-op Note  Patient: [name]  Procedure(s) Performed: Procedure(s): INSERTION OF ARTERIOVENOUS (AV) GORE-TEX GRAFT LEFT UPPER ARM USING  4-7 MM X 45 CM SRTETCH GORETEX GRAFT (Left)  Patient Location: PACU  Anesthesia Type:General  Level of Consciousness: awake and alert   Airway and Oxygen Therapy: Patient Spontanous Breathing  Post-op Pain: none  Post-op Assessment: Post-op Vital signs reviewed  Post-op Vital Signs: Reviewed  Last Vitals:  Filed Vitals:   09/01/14 1445  BP: 104/59  Pulse: 63  Temp:   Resp:     Complications: No apparent anesthesia complications

## 2014-09-01 NOTE — Discharge Instructions (Signed)
09/01/2014 [name] [id number] 02-24-1941  Surgeon(s): Angelia Mould, MD  Procedure(s): INSERTION OF ARTERIOVENOUS (AV) GORE-TEX GRAFT LEFT UPPER ARM USING  4-7 MM X 45 CM SRTETCH GORETEX GRAFT  x Do not stick graft for 4 weeks   What to eat:  For your first meals, you should eat lightly; only small meals initially.  If you do not have nausea, you may eat larger meals.  Avoid spicy, greasy and heavy food.    General Anesthesia, Adult, Care After  Refer to this sheet in the next few weeks. These instructions provide you with information on caring for yourself after your procedure. Your health care provider may also give you more specific instructions. Your treatment has been planned according to current medical practices, but problems sometimes occur. Call your health care provider if you have any problems or questions after your procedure.  WHAT TO EXPECT AFTER THE PROCEDURE  After the procedure, it is typical to experience:  Sleepiness.  Nausea and vomiting. HOME CARE INSTRUCTIONS  For the first 24 hours after general anesthesia:  Have a responsible person with you.  Do not drive a car. If you are alone, do not take public transportation.  Do not drink alcohol.  Do not take medicine that has not been prescribed by your health care provider.  Do not sign important papers or make important decisions.  You may resume a normal diet and activities as directed by your health care provider.  Change bandages (dressings) as directed.  If you have questions or problems that seem related to general anesthesia, call the hospital and ask for the anesthetist or anesthesiologist on call. SEEK MEDICAL CARE IF:  You have nausea and vomiting that continue the day after anesthesia.  You develop a rash. SEEK IMMEDIATE MEDICAL CARE IF:  You have difficulty breathing.  You have chest pain.  You have any allergic problems. Document Released: 10/07/2000 Document Revised: 03/03/2013  Document Reviewed: 01/14/2013  [facility] Patient Information 2014 [place], [place].   Sore Throat    A sore throat is a painful, burning, sore, or scratchy feeling of the throat. There may be pain or tenderness when swallowing or talking. You may have other symptoms with a sore throat. These include coughing, sneezing, fever, or a swollen neck. A sore throat is often the first sign of another sickness. These sicknesses may include a cold, flu, strep throat, or an infection called mono. Most sore throats go away without medical treatment.  HOME CARE  Only take medicine as told by your doctor.  Drink enough fluids to keep your pee (urine) clear or pale yellow.  Rest as needed.  Try using throat sprays, lozenges, or suck on hard candy (if older than 4 years or as told).  Sip warm liquids, such as broth, herbal tea, or warm water with honey. Try sucking on frozen ice pops or drinking cold liquids.  Rinse the mouth (gargle) with salt water. Mix 1 teaspoon salt with 8 ounces of water.  Do not smoke. Avoid being around others when they are smoking.  Put a humidifier in your bedroom at night to moisten the air. You can also turn on a hot shower and sit in the bathroom for 5-10 minutes. Be sure the bathroom door is closed. GET HELP RIGHT AWAY IF:  You have trouble breathing.  You cannot swallow fluids, soft foods, or your spit (saliva).  You have more puffiness (swelling) in the throat.  Your sore throat does not get  better in 7 days.  You feel sick to your stomach (nauseous) and throw up (vomit).  You have a fever or lasting symptoms for more than 2-3 days.  You have a fever and your symptoms suddenly get worse. MAKE SURE YOU:  Understand these instructions.  Will watch your condition.  Will get help right away if you are not doing well or get worse. Document Released: 04/09/2008 Document Revised: 03/25/2012 Document Reviewed: 03/08/2012  [facility] Patient Information 2015 [place], [place]. This  information is not intended to replace advice given to you by your health care provider. Make sure you discuss any questions you have with your health care provider.

## 2014-09-01 NOTE — Anesthesia Preprocedure Evaluation (Signed)
Anesthesia Evaluation  Patient identified by MRN, date of birth, ID band Patient awake    Reviewed: Allergy & Precautions, NPO status , Patient's Chart, lab work & pertinent test results  Airway Mallampati: II  TM Distance: >3 FB Neck ROM: Full    Dental  (+) Teeth Intact, Dental Advisory Given   Pulmonary  breath sounds clear to auscultation        Cardiovascular hypertension, Rhythm:Regular Rate:Normal     Neuro/Psych    GI/Hepatic   Endo/Other  diabetes  Renal/GU      Musculoskeletal   Abdominal (+) + obese,   Peds  Hematology   Anesthesia Other Findings   Reproductive/Obstetrics                             Anesthesia Physical Anesthesia Plan  ASA: III  Anesthesia Plan: General   Post-op Pain Management:    Induction: Intravenous  Airway Management Planned: LMA  Additional Equipment:   Intra-op Plan:   Post-operative Plan:   Informed Consent: I have reviewed the patients History and Physical, chart, labs and discussed the procedure including the risks, benefits and alternatives for the proposed anesthesia with the patient or authorized representative who has indicated his/her understanding and acceptance.   Dental advisory given  Plan Discussed with: CRNA and Anesthesiologist  Anesthesia Plan Comments: (ESRD not on HD K-3.9 Type 2 DM glucose 128 Hypertension H/O AFib in SR on last ECG Hearing difficulty  Plan GA with LMA  Roberts Gaudy )        Anesthesia Quick Evaluation

## 2014-09-01 NOTE — Interval H&P Note (Signed)
History and Physical Interval Note:  09/01/2014 10:45 AM  [name]  has presented today for surgery, with the diagnosis of End stage renal disease. Mechanical complication of graft   The various methods of treatment have been discussed with the patient and family. After consideration of risks, benefits and other options for treatment, the patient has consented to  Procedure(s): INSERTION OF ARTERIOVENOUS (AV) GORE-TEX GRAFT ARM (Left) as a surgical intervention .  The patient's history has been reviewed, patient examined, no change in status, stable for surgery.  I have reviewed the patient's chart and labs.  Questions were answered to the patient's satisfaction.     DICKSON,CHRISTOPHER S

## 2014-09-01 NOTE — Anesthesia Postprocedure Evaluation (Signed)
  Anesthesia Post-op Note  Patient: [name]  Procedure(s) Performed: Procedure(s): INSERTION OF ARTERIOVENOUS (AV) GORE-TEX GRAFT LEFT UPPER ARM USING  4-7 MM X 45 CM SRTETCH GORETEX GRAFT (Left)  Patient Location: PACU  Anesthesia Type:General  Level of Consciousness: awake, alert  and oriented  Airway and Oxygen Therapy: Patient Spontanous Breathing and Patient connected to nasal cannula oxygen  Post-op Pain: none  Post-op Assessment: Post-op Vital signs reviewed, Patient's Cardiovascular Status Stable, Respiratory Function Stable, Patent Airway and No signs of Nausea or vomiting  Post-op Vital Signs: stable  Last Vitals:  Filed Vitals:   09/01/14 1445  BP: 104/59  Pulse: 63  Temp:   Resp:     Complications: No apparent anesthesia complications

## 2014-09-01 NOTE — H&P (View-Only) (Signed)
   Patient name: [name] MRN: [id number] DOB: May 22, 1941 Sex: female  REASON FOR VISIT: Follow up of left brachiocephalic AV fistula and follow up of carotid disease  HPI: [name] is a 74 y.o. female who had a left brachiocephalic AV fistula placed on 05/31/2014. She comes in for a follow up visit. She is not yet on dialysis. She denies any recent uremic symptoms. Specifically, she denies nausea, vomiting, fatigue, anorexia, or palpitations. She's had no pain or paresthesias in her left upper extremity.  In addition, I have been following her with carotid disease. She had a left carotid endarterectomy in 2009. I have been following a moderate right carotid stenosis. Since I saw her last, she denies any history of stroke, TIAs, expressive or receptive aphasia, or amaurosis fugax.  REVIEW OF SYSTEMS: Valu.Nieves ] denotes positive finding; [  ] denotes negative finding  CARDIOVASCULAR:  [ ]  chest pain   [ ]  dyspnea on exertion    CONSTITUTIONAL:  [ ]  fever   [ ]  chills  PHYSICAL EXAM: Filed Vitals:   08/17/14 1556  BP: 133/49  Pulse: 66  Height: 5' 4.5" (1.638 m)  Weight: 205 lb 14.4 oz (93.396 kg)  SpO2: 98%   Body mass index is 34.81 kg/(m^2). GENERAL: The patient is a well-nourished female, in no acute distress. The vital signs are documented above. CARDIOVASCULAR: There is a regular rate and rhythm. She has a right carotid bruit.  PULMONARY: There is good air exchange bilaterally without wheezing or rales. She has a weak thrill in her left upper arm brachiocephalic fistula. She has a palpable left radial pulse.  I have independently interpreted her carotid duplex scan which shows that her left carotid endarterectomy site is widely patent. There is no evidence of restenosis. On the right side she has a 6079% proximal internal carotid artery stenosis. This is stable compared to 6 months ago.  I have independently interpreted her duplex of her left brachiocephalic AV fistula. This  shows that the diameters of her fistula range from 0.22 cm-0.43 cm.  MEDICAL ISSUES:  STAGE IV CHRONIC KIDNEY DISEASE: Her fistula is not maturing adequately. For this reason I have recommended that we proceed with a fistulogram. I have discussed the procedure and potential complications. I have explained that if we see disease amenable to venoplasty we could proceed at the same time. If the fistula does not appear to be salvageable, then we would need to consider new access. She is not yet on dialysis.  RIGHT CAROTID STENOSIS: The patient has an asymptomatic 60-79% right carotid stenosis. She understands we would not consider right carotid endarterectomy unless the stenosis progressed to greater than 80% or she developed new right hemispheric symptoms. He has no evidence of recurrent carotid stenosis on the left where she's had previous left carotid endarterectomy in 2009. I've ordered a follow carotid duplex scan in 6 months and I'll see her back at that time. She knows to call sooner if she has problems. In the meantime she is to continue taking her aspirin.  [place] [facility] [place] [facility]per: [phone]

## 2014-09-01 NOTE — Op Note (Signed)
    NAME: [name]   MRN: [id number] DOB: [birth year]    DATE OF OPERATION: 09/01/2014  PREOP DIAGNOSIS: Stage IV chronic kidney disease  POSTOP DIAGNOSIS: Same  PROCEDURE:  New Left upper arm AV graft  SURGEON: Judeth Cornfield. Scot Dock, MD, FACS  ASSIST: Leontine Locket, PA  ANESTHESIA: Gen.   EBL: minimal  INDICATIONS: [name] is a 74 y.o. female had a left upper arm fistula which was not maturing adequately. She had multiple problems with the fistula and new access was recommended.  FINDINGS: the brachial artery was somewhat small and calcified but had a reasonable pulse. The axillary vein was 4.5 mm.  TECHNIQUE: The patient was taken to the operating room and received a general anesthetic. The left upper extremity was prepped and draped in usual sterile fashion. A longitudinal incision was made over the brachial artery. The old fistula was identified and ligated and divided between 2-0 silk ties. I dissected down to the brachial artery and there was significant scar tissue. I decided to originate the graft proximal to the anastomosis of the fistula. The artery was somewhat calcified. A separate longitudinal incision was made beneath the axilla. Here the high brachial vein was dissected free. A 4-7 mm PTFE graft was tunneled in the upper arm and the patient was heparinized. The brachial artery was clamped proximally and distally and a longitudinal arteriotomy was made. A segment of the 4 mm end of the graft was excised, the graft slightly spatulated, and sewn into side to the brachial artery using continuous 6-0 Prolene suture. The graft was then pulled the probe length for anastomosis to the high brachial vein. The vein was ligated distally and spatulated proximally. The graft was cut to the appropriate length, spatulated, and sewn into into the vein using continuous 6-0 Prolene suture. At the completion was a good thrill in the fistula. There was a radial and ulnar signal with the  Doppler. The heparin was partially reversed with protamine. The wounds were closed the deep layer of 3-0 Vicryl and the skin closed with 4-0 Vicryl. Dermabond was applied. The patient tolerated the procedure well and was transferred to the recovery room in stable condition. All needle and sponge counts were correct.  [name], MD, FACS [facility]  DATE OF DICTATION:   09/01/2014

## 2014-09-03 ENCOUNTER — Encounter (HOSPITAL_COMMUNITY): Payer: Self-pay | Admitting: Vascular Surgery

## 2014-09-05 ENCOUNTER — Telehealth: Payer: Self-pay | Admitting: Vascular Surgery

## 2014-09-05 NOTE — Telephone Encounter (Signed)
Lm for pt re appt, dpm  °

## 2014-09-05 NOTE — Telephone Encounter (Signed)
-----   Message from Mena Goes, RN sent at 09/02/2014 10:03 AM EST ----- Regarding: Schedule   ----- Message -----    From: Angelia Mould, MD    Sent: 09/01/2014   1:29 PM      To: [facility] Charge Pool Subject: charge and f/u                                 PROCEDURE:  New Left upper arm AV graft  SURGEON: Judeth Cornfield. Scot Dock, MD, FACS  ASSIST: Leontine Locket, PA  She needs a follow up visit in 2-3 weeks to check on her incisions. Thank you. CD

## 2014-09-07 ENCOUNTER — Encounter (HOSPITAL_COMMUNITY)
Admission: RE | Admit: 2014-09-07 | Discharge: 2014-09-07 | Disposition: A | Discharge status: Home / Self Care | Payer: Medicare Other | Source: Ambulatory Visit | Attending: Nephrology | Admitting: Nephrology

## 2014-09-07 DIAGNOSIS — N184 Chronic kidney disease, stage 4 (severe): Secondary | ICD-10-CM | POA: Diagnosis not present

## 2014-09-07 DIAGNOSIS — D631 Anemia in chronic kidney disease: Secondary | ICD-10-CM | POA: Diagnosis not present

## 2014-09-07 LAB — POCT HEMOGLOBIN-HEMACUE: Hemoglobin: 10.3 g/dL — ABNORMAL LOW (ref 12.0–15.0)

## 2014-09-07 MED ORDER — EPOETIN ALFA 20000 UNIT/ML IJ SOLN: 20000.0000 [IU] | INTRAMUSCULAR | Status: DC

## 2014-09-08 MED ORDER — EPOETIN ALFA 20000 UNIT/ML IJ SOLN
INTRAMUSCULAR | Status: AC
  Administered 2014-09-07: 20000 [IU] via SUBCUTANEOUS
  Filled 2014-09-08: qty 1

## 2014-09-13 ENCOUNTER — Encounter: Payer: Self-pay | Admitting: Vascular Surgery

## 2014-09-14 ENCOUNTER — Encounter: Payer: Self-pay | Admitting: Vascular Surgery

## 2014-09-14 ENCOUNTER — Ambulatory Visit (INDEPENDENT_AMBULATORY_CARE_PROVIDER_SITE_OTHER): Payer: Self-pay | Admitting: Vascular Surgery

## 2014-09-14 VITALS — BP 100/48 | HR 56 | Resp 14 | Ht 64.5 in | Wt 200.0 lb

## 2014-09-14 DIAGNOSIS — N184 Chronic kidney disease, stage 4 (severe): Secondary | ICD-10-CM

## 2014-09-14 NOTE — Progress Notes (Signed)
   Patient name: [name] MRN: [id number] DOB: 30-Nov-1940 Sex: female  REASON FOR VISIT: follow up after placement of left upper arm AV graft  HPI: [name] is a 74 y.o. female underwent placement of a left brachiocephalic AV fistula. This was her only option for a fistula and this failed to mature adequately. Therefore on 09/01/2014 she underwent placement of a new left upper arm AV graft. I used a 4-7 mm PTFE graft. She had very thin skin and therefore brought her back to evaluate her wounds. She has no specific complaints, except for some mild steal symptoms in her left hand which are improving.Marland Kitchen  REVIEW OF SYSTEMS: Valu.Nieves ] denotes positive finding; [  ] denotes negative finding  CARDIOVASCULAR:  [ ]  chest pain   [ ]  dyspnea on exertion    CONSTITUTIONAL:  [ ]  fever   [ ]  chills  PHYSICAL EXAM: Filed Vitals:   09/14/14 1513  BP: 100/48  Pulse: 56  Resp: 14  Height: 5' 4.5" (1.638 m)  Weight: 200 lb (90.719 kg)   Body mass index is 33.81 kg/(m^2). GENERAL: The patient is a well-nourished female, in no acute distress. The vital signs are documented above. CARDIOVASCULAR: There is a regular rate and rhythm. PULMONARY: There is good air exchange bilaterally without wheezing or rales. Her left upper arm graft has an excellent bruit and thrill. Her incisions are healing nicely. She has a radial ulnar and palmar arch signal with the Doppler.  MEDICAL ISSUES: The patient is doing well status post placement of a new left upper arm AV graft. I think this can be used in 2 weeks. I'll see her back as needed. She knows to call for symptoms in her hand progress at all or do not improve.  [place] [facility] [place] [facility]per: [phone]

## 2014-09-19 DIAGNOSIS — H4011X1 Primary open-angle glaucoma, mild stage: Secondary | ICD-10-CM | POA: Diagnosis not present

## 2014-09-27 ENCOUNTER — Other Ambulatory Visit (HOSPITAL_COMMUNITY): Payer: Self-pay | Admitting: *Deleted

## 2014-09-28 ENCOUNTER — Encounter (HOSPITAL_COMMUNITY)
Admission: RE | Admit: 2014-09-28 | Discharge: 2014-09-28 | Disposition: A | Discharge status: Home / Self Care | Payer: Medicare Other | Source: Ambulatory Visit | Attending: Nephrology | Admitting: Nephrology

## 2014-09-28 DIAGNOSIS — N184 Chronic kidney disease, stage 4 (severe): Secondary | ICD-10-CM | POA: Diagnosis not present

## 2014-09-28 DIAGNOSIS — D631 Anemia in chronic kidney disease: Secondary | ICD-10-CM | POA: Diagnosis not present

## 2014-09-28 LAB — POCT HEMOGLOBIN-HEMACUE: Hemoglobin: 10.7 g/dL — ABNORMAL LOW (ref 12.0–15.0)

## 2014-09-28 MED ORDER — EPOETIN ALFA 20000 UNIT/ML IJ SOLN
20000.0000 [IU] | INTRAMUSCULAR | Status: DC
  Administered 2014-09-28: 20000 [IU] via SUBCUTANEOUS

## 2014-09-29 MED ORDER — EPOETIN ALFA 20000 UNIT/ML IJ SOLN
INTRAMUSCULAR | Status: AC
  Filled 2014-09-29: qty 1

## 2014-10-19 ENCOUNTER — Encounter (HOSPITAL_COMMUNITY)
Admission: RE | Admit: 2014-10-19 | Discharge: 2014-10-19 | Disposition: A | Discharge status: Home / Self Care | Payer: Medicare Other | Source: Ambulatory Visit | Attending: Nephrology | Admitting: Nephrology

## 2014-10-19 DIAGNOSIS — D631 Anemia in chronic kidney disease: Secondary | ICD-10-CM | POA: Diagnosis not present

## 2014-10-19 DIAGNOSIS — Z5181 Encounter for therapeutic drug level monitoring: Secondary | ICD-10-CM | POA: Diagnosis not present

## 2014-10-19 DIAGNOSIS — Z79899 Other long term (current) drug therapy: Secondary | ICD-10-CM | POA: Diagnosis not present

## 2014-10-19 DIAGNOSIS — N184 Chronic kidney disease, stage 4 (severe): Secondary | ICD-10-CM | POA: Insufficient documentation

## 2014-10-19 LAB — FERRITIN: FERRITIN: 298 ng/mL — AB (ref 10–291)

## 2014-10-19 LAB — IRON AND TIBC
IRON: 58 ug/dL (ref 42–145)
Saturation Ratios: 21 % (ref 20–55)
TIBC: 274 ug/dL (ref 250–470)
UIBC: 216 ug/dL (ref 125–400)

## 2014-10-19 LAB — POCT HEMOGLOBIN-HEMACUE: HEMOGLOBIN: 10.1 g/dL — AB (ref 12.0–15.0)

## 2014-10-19 MED ORDER — EPOETIN ALFA 20000 UNIT/ML IJ SOLN
20000.0000 [IU] | INTRAMUSCULAR | Status: DC
  Administered 2014-10-19: 20000 [IU] via SUBCUTANEOUS

## 2014-10-19 MED ORDER — EPOETIN ALFA 20000 UNIT/ML IJ SOLN
INTRAMUSCULAR | Status: AC
  Filled 2014-10-19: qty 1

## 2014-10-26 DIAGNOSIS — D631 Anemia in chronic kidney disease: Secondary | ICD-10-CM | POA: Diagnosis not present

## 2014-10-26 DIAGNOSIS — N2581 Secondary hyperparathyroidism of renal origin: Secondary | ICD-10-CM | POA: Diagnosis not present

## 2014-10-26 DIAGNOSIS — I129 Hypertensive chronic kidney disease with stage 1 through stage 4 chronic kidney disease, or unspecified chronic kidney disease: Secondary | ICD-10-CM | POA: Diagnosis not present

## 2014-10-26 DIAGNOSIS — N184 Chronic kidney disease, stage 4 (severe): Secondary | ICD-10-CM | POA: Diagnosis not present

## 2014-11-07 DIAGNOSIS — D81818 Other biotin-dependent carboxylase deficiency: Secondary | ICD-10-CM | POA: Diagnosis not present

## 2014-11-07 DIAGNOSIS — E0840 Diabetes mellitus due to underlying condition with diabetic neuropathy, unspecified: Secondary | ICD-10-CM | POA: Diagnosis not present

## 2014-11-07 DIAGNOSIS — R5382 Chronic fatigue, unspecified: Secondary | ICD-10-CM | POA: Diagnosis not present

## 2014-11-07 DIAGNOSIS — E1165 Type 2 diabetes mellitus with hyperglycemia: Secondary | ICD-10-CM | POA: Diagnosis not present

## 2014-11-07 DIAGNOSIS — E1121 Type 2 diabetes mellitus with diabetic nephropathy: Secondary | ICD-10-CM | POA: Diagnosis not present

## 2014-11-07 DIAGNOSIS — N184 Chronic kidney disease, stage 4 (severe): Secondary | ICD-10-CM | POA: Diagnosis not present

## 2014-11-07 DIAGNOSIS — E11319 Type 2 diabetes mellitus with unspecified diabetic retinopathy without macular edema: Secondary | ICD-10-CM | POA: Diagnosis not present

## 2014-11-07 DIAGNOSIS — Z0001 Encounter for general adult medical examination with abnormal findings: Secondary | ICD-10-CM | POA: Diagnosis not present

## 2014-11-07 DIAGNOSIS — Z1389 Encounter for screening for other disorder: Secondary | ICD-10-CM | POA: Diagnosis not present

## 2014-11-07 DIAGNOSIS — L570 Actinic keratosis: Secondary | ICD-10-CM | POA: Diagnosis not present

## 2014-11-07 DIAGNOSIS — K219 Gastro-esophageal reflux disease without esophagitis: Secondary | ICD-10-CM | POA: Diagnosis not present

## 2014-11-07 DIAGNOSIS — E559 Vitamin D deficiency, unspecified: Secondary | ICD-10-CM | POA: Diagnosis not present

## 2014-11-07 DIAGNOSIS — I25111 Atherosclerotic heart disease of native coronary artery with angina pectoris with documented spasm: Secondary | ICD-10-CM | POA: Diagnosis not present

## 2014-11-07 DIAGNOSIS — L680 Hirsutism: Secondary | ICD-10-CM | POA: Diagnosis not present

## 2014-11-09 ENCOUNTER — Encounter (HOSPITAL_COMMUNITY)
Admission: RE | Admit: 2014-11-09 | Discharge: 2014-11-09 | Disposition: A | Discharge status: Home / Self Care | Payer: Medicare Other | Source: Ambulatory Visit | Attending: Nephrology | Admitting: Nephrology

## 2014-11-09 DIAGNOSIS — Z79899 Other long term (current) drug therapy: Secondary | ICD-10-CM | POA: Diagnosis not present

## 2014-11-09 DIAGNOSIS — Z5181 Encounter for therapeutic drug level monitoring: Secondary | ICD-10-CM | POA: Diagnosis not present

## 2014-11-09 DIAGNOSIS — N184 Chronic kidney disease, stage 4 (severe): Secondary | ICD-10-CM | POA: Diagnosis not present

## 2014-11-09 DIAGNOSIS — D631 Anemia in chronic kidney disease: Secondary | ICD-10-CM | POA: Diagnosis not present

## 2014-11-09 LAB — POCT HEMOGLOBIN-HEMACUE: HEMOGLOBIN: 10.6 g/dL — AB (ref 12.0–15.0)

## 2014-11-09 MED ORDER — EPOETIN ALFA 20000 UNIT/ML IJ SOLN
INTRAMUSCULAR | Status: AC
  Filled 2014-11-09: qty 1

## 2014-11-09 MED ORDER — EPOETIN ALFA 20000 UNIT/ML IJ SOLN
20000.0000 [IU] | INTRAMUSCULAR | Status: DC
  Administered 2014-11-09: 20000 [IU] via SUBCUTANEOUS

## 2014-11-14 DIAGNOSIS — M5416 Radiculopathy, lumbar region: Secondary | ICD-10-CM | POA: Diagnosis not present

## 2014-11-15 ENCOUNTER — Other Ambulatory Visit: Payer: Self-pay

## 2014-11-15 DIAGNOSIS — Z1231 Encounter for screening mammogram for malignant neoplasm of breast: Secondary | ICD-10-CM

## 2014-11-30 ENCOUNTER — Encounter (HOSPITAL_COMMUNITY)
Admission: RE | Admit: 2014-11-30 | Discharge: 2014-11-30 | Disposition: A | Discharge status: Home / Self Care | Payer: Medicare Other | Source: Ambulatory Visit | Attending: Nephrology | Admitting: Nephrology

## 2014-11-30 DIAGNOSIS — D631 Anemia in chronic kidney disease: Secondary | ICD-10-CM | POA: Diagnosis not present

## 2014-11-30 DIAGNOSIS — N184 Chronic kidney disease, stage 4 (severe): Secondary | ICD-10-CM | POA: Diagnosis not present

## 2014-11-30 LAB — POCT HEMOGLOBIN-HEMACUE: HEMOGLOBIN: 11.5 g/dL — AB (ref 12.0–15.0)

## 2014-11-30 MED ORDER — EPOETIN ALFA 20000 UNIT/ML IJ SOLN
INTRAMUSCULAR | Status: AC
  Filled 2014-11-30: qty 1

## 2014-11-30 MED ORDER — EPOETIN ALFA 20000 UNIT/ML IJ SOLN
20000.0000 [IU] | INTRAMUSCULAR | Status: DC
  Administered 2014-11-30: 20000 [IU] via SUBCUTANEOUS

## 2014-12-20 ENCOUNTER — Other Ambulatory Visit (HOSPITAL_COMMUNITY): Payer: Self-pay | Admitting: *Deleted

## 2014-12-21 ENCOUNTER — Encounter (HOSPITAL_COMMUNITY)
Admission: RE | Admit: 2014-12-21 | Discharge: 2014-12-21 | Disposition: A | Discharge status: Home / Self Care | Payer: Medicare Other | Source: Ambulatory Visit | Attending: Nephrology | Admitting: Nephrology

## 2014-12-21 DIAGNOSIS — Z79899 Other long term (current) drug therapy: Secondary | ICD-10-CM | POA: Insufficient documentation

## 2014-12-21 DIAGNOSIS — D631 Anemia in chronic kidney disease: Secondary | ICD-10-CM | POA: Insufficient documentation

## 2014-12-21 DIAGNOSIS — N184 Chronic kidney disease, stage 4 (severe): Secondary | ICD-10-CM | POA: Diagnosis not present

## 2014-12-21 DIAGNOSIS — Z5181 Encounter for therapeutic drug level monitoring: Secondary | ICD-10-CM | POA: Diagnosis not present

## 2014-12-21 LAB — POCT HEMOGLOBIN-HEMACUE: Hemoglobin: 11.4 g/dL — ABNORMAL LOW (ref 12.0–15.0)

## 2014-12-21 MED ORDER — EPOETIN ALFA 20000 UNIT/ML IJ SOLN
INTRAMUSCULAR | Status: AC
  Filled 2014-12-21: qty 1

## 2014-12-21 MED ORDER — EPOETIN ALFA 20000 UNIT/ML IJ SOLN
20000.0000 [IU] | INTRAMUSCULAR | Status: DC
  Administered 2014-12-21: 20000 [IU] via SUBCUTANEOUS

## 2014-12-26 ENCOUNTER — Ambulatory Visit
Admission: RE | Admit: 2014-12-26 | Discharge: 2014-12-26 | Disposition: A | Discharge status: Home / Self Care | Payer: Medicare Other | Source: Ambulatory Visit

## 2014-12-26 DIAGNOSIS — Z1231 Encounter for screening mammogram for malignant neoplasm of breast: Secondary | ICD-10-CM

## 2015-01-02 DIAGNOSIS — N2581 Secondary hyperparathyroidism of renal origin: Secondary | ICD-10-CM | POA: Diagnosis not present

## 2015-01-02 DIAGNOSIS — D631 Anemia in chronic kidney disease: Secondary | ICD-10-CM | POA: Diagnosis not present

## 2015-01-02 DIAGNOSIS — N184 Chronic kidney disease, stage 4 (severe): Secondary | ICD-10-CM | POA: Diagnosis not present

## 2015-01-02 DIAGNOSIS — I129 Hypertensive chronic kidney disease with stage 1 through stage 4 chronic kidney disease, or unspecified chronic kidney disease: Secondary | ICD-10-CM | POA: Diagnosis not present

## 2015-01-11 ENCOUNTER — Encounter (HOSPITAL_COMMUNITY)
Admission: RE | Admit: 2015-01-11 | Discharge: 2015-01-11 | Disposition: A | Discharge status: Home / Self Care | Payer: Medicare Other | Source: Ambulatory Visit | Attending: Nephrology | Admitting: Nephrology

## 2015-01-11 DIAGNOSIS — Z79899 Other long term (current) drug therapy: Secondary | ICD-10-CM | POA: Diagnosis not present

## 2015-01-11 DIAGNOSIS — N184 Chronic kidney disease, stage 4 (severe): Secondary | ICD-10-CM | POA: Diagnosis not present

## 2015-01-11 DIAGNOSIS — D631 Anemia in chronic kidney disease: Secondary | ICD-10-CM | POA: Diagnosis not present

## 2015-01-11 DIAGNOSIS — Z5181 Encounter for therapeutic drug level monitoring: Secondary | ICD-10-CM | POA: Diagnosis not present

## 2015-01-11 LAB — POCT HEMOGLOBIN-HEMACUE: Hemoglobin: 12.2 g/dL (ref 12.0–15.0)

## 2015-01-11 MED ORDER — EPOETIN ALFA 20000 UNIT/ML IJ SOLN: 20000.0000 [IU] | INTRAMUSCULAR | Status: DC

## 2015-01-24 DIAGNOSIS — E11359 Type 2 diabetes mellitus with proliferative diabetic retinopathy without macular edema: Secondary | ICD-10-CM | POA: Diagnosis not present

## 2015-01-24 DIAGNOSIS — E11349 Type 2 diabetes mellitus with severe nonproliferative diabetic retinopathy without macular edema: Secondary | ICD-10-CM | POA: Diagnosis not present

## 2015-01-24 DIAGNOSIS — H35352 Cystoid macular degeneration, left eye: Secondary | ICD-10-CM | POA: Diagnosis not present

## 2015-01-25 ENCOUNTER — Encounter (HOSPITAL_COMMUNITY)
Admission: RE | Admit: 2015-01-25 | Discharge: 2015-01-25 | Disposition: A | Discharge status: Home / Self Care | Payer: Medicare Other | Source: Ambulatory Visit | Attending: Nephrology | Admitting: Nephrology

## 2015-01-25 DIAGNOSIS — D631 Anemia in chronic kidney disease: Secondary | ICD-10-CM | POA: Insufficient documentation

## 2015-01-25 DIAGNOSIS — N184 Chronic kidney disease, stage 4 (severe): Secondary | ICD-10-CM | POA: Diagnosis not present

## 2015-01-25 LAB — POCT HEMOGLOBIN-HEMACUE: HEMOGLOBIN: 11.3 g/dL — AB (ref 12.0–15.0)

## 2015-01-25 LAB — IRON AND TIBC
Iron: 89 ug/dL (ref 28–170)
SATURATION RATIOS: 29 % (ref 10.4–31.8)
TIBC: 312 ug/dL (ref 250–450)
UIBC: 223 ug/dL

## 2015-01-25 LAB — FERRITIN: FERRITIN: 337 ng/mL — AB (ref 11–307)

## 2015-01-25 MED ORDER — EPOETIN ALFA 20000 UNIT/ML IJ SOLN
20000.0000 [IU] | INTRAMUSCULAR | Status: DC
Start: 2015-01-25 — End: 2015-01-26
  Administered 2015-01-25: 20000 [IU] via SUBCUTANEOUS

## 2015-01-25 MED ORDER — EPOETIN ALFA 20000 UNIT/ML IJ SOLN
INTRAMUSCULAR | Status: AC
  Filled 2015-01-25: qty 1

## 2015-01-26 DIAGNOSIS — M5416 Radiculopathy, lumbar region: Secondary | ICD-10-CM | POA: Diagnosis not present

## 2015-02-15 ENCOUNTER — Other Ambulatory Visit (HOSPITAL_COMMUNITY): Payer: Medicare Other

## 2015-02-15 ENCOUNTER — Ambulatory Visit: Payer: Medicare Other | Admitting: Vascular Surgery

## 2015-02-15 ENCOUNTER — Encounter (HOSPITAL_COMMUNITY)
Admission: RE | Admit: 2015-02-15 | Discharge: 2015-02-15 | Disposition: A | Discharge status: Home / Self Care | Payer: Medicare Other | Source: Ambulatory Visit | Attending: Nephrology | Admitting: Nephrology

## 2015-02-15 DIAGNOSIS — N184 Chronic kidney disease, stage 4 (severe): Secondary | ICD-10-CM | POA: Insufficient documentation

## 2015-02-15 DIAGNOSIS — D631 Anemia in chronic kidney disease: Secondary | ICD-10-CM | POA: Insufficient documentation

## 2015-02-15 LAB — POCT HEMOGLOBIN-HEMACUE: Hemoglobin: 11 g/dL — ABNORMAL LOW (ref 12.0–15.0)

## 2015-02-15 MED ORDER — EPOETIN ALFA 20000 UNIT/ML IJ SOLN
20000.0000 [IU] | INTRAMUSCULAR | Status: DC
  Administered 2015-02-15: 20000 [IU] via SUBCUTANEOUS

## 2015-02-15 MED ORDER — EPOETIN ALFA 20000 UNIT/ML IJ SOLN
INTRAMUSCULAR | Status: AC
  Filled 2015-02-15: qty 1

## 2015-02-16 DIAGNOSIS — H4011X1 Primary open-angle glaucoma, mild stage: Secondary | ICD-10-CM | POA: Diagnosis not present

## 2015-02-24 ENCOUNTER — Other Ambulatory Visit: Payer: Self-pay | Admitting: Vascular Surgery

## 2015-02-24 ENCOUNTER — Other Ambulatory Visit: Payer: Self-pay | Admitting: Family

## 2015-02-24 ENCOUNTER — Ambulatory Visit (HOSPITAL_COMMUNITY)
Admission: RE | Admit: 2015-02-24 | Discharge: 2015-02-24 | Disposition: A | Discharge status: Home / Self Care | Payer: Medicare Other | Source: Ambulatory Visit | Attending: Vascular Surgery | Admitting: Vascular Surgery

## 2015-02-24 DIAGNOSIS — Z48812 Encounter for surgical aftercare following surgery on the circulatory system: Secondary | ICD-10-CM | POA: Diagnosis not present

## 2015-02-24 DIAGNOSIS — I6521 Occlusion and stenosis of right carotid artery: Secondary | ICD-10-CM | POA: Diagnosis not present

## 2015-02-24 DIAGNOSIS — N184 Chronic kidney disease, stage 4 (severe): Secondary | ICD-10-CM

## 2015-02-24 DIAGNOSIS — I6523 Occlusion and stenosis of bilateral carotid arteries: Secondary | ICD-10-CM

## 2015-02-27 DIAGNOSIS — M5416 Radiculopathy, lumbar region: Secondary | ICD-10-CM | POA: Diagnosis not present

## 2015-02-27 DIAGNOSIS — M4806 Spinal stenosis, lumbar region: Secondary | ICD-10-CM | POA: Diagnosis not present

## 2015-02-28 ENCOUNTER — Encounter: Payer: Self-pay | Admitting: Vascular Surgery

## 2015-02-28 ENCOUNTER — Other Ambulatory Visit: Payer: Self-pay | Admitting: Physical Medicine and Rehabilitation

## 2015-03-01 ENCOUNTER — Encounter: Payer: Self-pay | Admitting: Vascular Surgery

## 2015-03-01 ENCOUNTER — Ambulatory Visit (INDEPENDENT_AMBULATORY_CARE_PROVIDER_SITE_OTHER): Payer: Medicare Other | Admitting: Vascular Surgery

## 2015-03-01 VITALS — BP 125/46 | HR 54 | Temp 98.2°F | Resp 14 | Ht 64.5 in | Wt 196.0 lb

## 2015-03-01 DIAGNOSIS — I6523 Occlusion and stenosis of bilateral carotid arteries: Secondary | ICD-10-CM | POA: Diagnosis not present

## 2015-03-01 NOTE — Progress Notes (Signed)
Vascular and Vein Specialist of   Patient name: [name] MRN: [id number] DOB: 09/15/1940 Sex: female  REASON FOR VISIT: Follow up of carotid disease.  HPI: [name] is a 74 y.o. female who I have been following with carotid disease. She had a left carotid endarterectomy in 2009. I have been following a moderate right carotid stenosis. I last saw her on 08/17/2014 to follow her carotid disease. At that time she had an asymptomatic 60-79% right carotid stenosis. There was no evidence of recurrent stenosis on the left. I recommended a follow up study in 6 months.  Inside saw her last, she denies any history of stroke, TIAs, expressive or receptive aphasia, or amaurosis fugax. She is on aspirin and is on a statin. She is right-handed.  Her kidney function has been stable and she is not yet on dialysis.  Of note, I performed a new left upper arm graft on this patient on 09/01/2014.  Past Medical History  Diagnosis Date  . Anemia   . Glaucoma   . Hypertension   . Blood transfusion   . GERD (gastroesophageal reflux disease)   . Arthritis   . Neuromuscular disorder     CARPEL TUNNEL  . Chronic kidney disease   . Atrial fibrillation   . CAD (coronary artery disease)   . Diabetes mellitus without complication   . Carotid artery occlusion     Carotid Endartectom,y - left 2009.  Blockage Right being watched by Dr Scot Dock.  Marland Kitchen HOH (hard of hearing)   . Stroke     hx of TIA  . Shortness of breath   . Depression   . History of kidney stones     passed  . Cancer     .  top of head- melonoma  . Carotid stenosis    Family History  Problem Relation Age of Onset  . Diabetes Mother   . Hypertension Mother   . Heart disease Mother     beofre age 75  . Heart attack Mother   . Cancer Father   . Hyperlipidemia Father   . Hypertension Father   . Deep vein thrombosis Daughter   . Diabetes Daughter   . Hyperlipidemia Daughter   . Hypertension Daughter   . Heart  disease Daughter   . Peripheral vascular disease Daughter    SOCIAL HISTORY: Social History  Substance Use Topics  . Smoking status: Never Smoker   . Smokeless tobacco: Never Used  . Alcohol Use: No   No Known Allergies Current Outpatient Prescriptions  Medication Sig Dispense Refill  . acetaminophen (TYLENOL) 500 MG tablet Take 500 mg by mouth every 6 (six) hours as needed. For pain    . allopurinol (ZYLOPRIM) 100 MG tablet Take 200 mg by mouth daily.     Marland Kitchen amLODipine (NORVASC) 10 MG tablet TAKE ONE TABLET BY MOUTH ONE TIME DAILY 30 tablet 6  . aspirin 325 MG tablet Take 325 mg by mouth daily.    Marland Kitchen atorvastatin (LIPITOR) 10 MG tablet TAKE ONE TABLET BY MOUTH ONE TIME DAILY 30 tablet 0  . B Complex Vitamins (VITAMIN B COMPLEX PO) Take 1 tablet by mouth daily.    . brimonidine (ALPHAGAN P) 0.1 % SOLN Place 1 drop into the left eye 2 (two) times daily.    . Cholecalciferol (VITAMIN D) 2000 UNITS tablet Take 2,000 Units by mouth 2 (two) times daily.    . citalopram (CELEXA) 20 MG tablet Take 20 mg by mouth  daily.    . colchicine 0.6 MG tablet Take 0.6 mg by mouth 2 (two) times daily. gout    . fish oil-omega-3 fatty acids 1000 MG capsule Take 2 g by mouth daily.    . furosemide (LASIX) 40 MG tablet Take 80 mg by mouth 2 (two) times daily.     Marland Kitchen gabapentin (NEURONTIN) 300 MG capsule daily.  1  . HUMALOG 100 UNIT/ML injection Inject 0-12 Units into the skin 3 (three) times daily with meals. Sliding scale    . HYDROcodone-acetaminophen (NORCO/VICODIN) 5-325 MG per tablet 2 (two) times daily.  0  . insulin glargine (LANTUS) 100 UNIT/ML injection Inject 30 Units into the skin at bedtime.    Marland Kitchen linagliptin (TRADJENTA) 5 MG TABS tablet Take 1 tablet (5 mg total) by mouth daily with breakfast. 30 tablet 1  . losartan (COZAAR) 100 MG tablet Take 100 mg by mouth daily.    . metoprolol (LOPRESSOR) 50 MG tablet Take 50 mg by mouth 2 (two) times daily.    Marland Kitchen omeprazole (PRILOSEC) 20 MG capsule Take 20  mg by mouth daily as needed. Acid reflux    . timolol (TIMOPTIC) 0.5 % ophthalmic solution Place 1 drop into the left eye daily.    . travoprost, benzalkonium, (TRAVATAN) 0.004 % ophthalmic solution Place 1 drop into the left eye at bedtime.    . insulin glargine (LANTUS) 100 UNIT/ML injection Inject 30 Units into the skin every morning. 10 mL 1  . metoprolol (LOPRESSOR) 50 MG tablet Take 1 tablet (50 mg total) by mouth 2 (two) times daily. 60 tablet 1  . oxyCODONE (ROXICODONE) 5 MG immediate release tablet Take 1 tablet (5 mg total) by mouth every 6 (six) hours as needed. (Patient not taking: Reported on 03/01/2015) 20 tablet 0   No current facility-administered medications for this visit.   REVIEW OF SYSTEMS: Valu.Nieves ] denotes positive finding; [  ] denotes negative finding  CARDIOVASCULAR:  [ ]  chest pain   [ ]  chest pressure   [ ]  palpitations   [ ]  orthopnea   [ ]  dyspnea on exertion   Valu.Nieves ] claudication   [ ]  rest pain   [ ]  DVT   [ ]  phlebitis PULMONARY:   [ ]  productive cough   [ ]  asthma   [ ]  wheezing NEUROLOGIC:   [ ]  weakness  [ ]  paresthesias  [ ]  aphasia  [ ]  amaurosis  [ ]  dizziness HEMATOLOGIC:   [ ]  bleeding problems   [ ]  clotting disorders MUSCULOSKELETAL:  [ ]  joint pain   [ ]  joint swelling [ ]  leg swelling GASTROINTESTINAL: [ ]   blood in stool  [ ]   hematemesis GENITOURINARY:  [ ]   dysuria  [ ]   hematuria PSYCHIATRIC:  [ ]  history of major depression INTEGUMENTARY:  [ ]  rashes  [ ]  ulcers CONSTITUTIONAL:  [ ]  fever   [ ]  chills  PHYSICAL EXAM: Filed Vitals:   03/01/15 1540  BP: 125/46  Pulse: 54  Temp: 98.2 F (36.8 C)  TempSrc: Oral  Resp: 14  Height: 5' 4.5" (1.638 m)  Weight: 196 lb (88.905 kg)  SpO2: 99%   GENERAL: The patient is a well-nourished female, in no acute distress. The vital signs are documented above. CARDIAC: There is a regular rate and rhythm.  VASCULAR: She has a right carotid bruit. She has a palpable thrill in her left upper arm  graft. PULMONARY: There is good air exchange bilaterally without wheezing or  rales. ABDOMEN: Soft and non-tender with normal pitched bowel sounds.  MUSCULOSKELETAL: There are no major deformities or cyanosis. NEUROLOGIC: No focal weakness or paresthesias are detected. SKIN: There are no ulcers or rashes noted. PSYCHIATRIC: The patient has a normal affect.  DATA:  CAROTID DUPLEX: I have reviewed the carotid duplex scan which was performed on 02/24/2015. This shows that the right carotid stenosis progressed to greater than 80%. There is no evidence of recurrent left carotid stenosis. Both vertebral arteries are patent with antegrade flow.  MEDICAL ISSUES:  ASYMPTOMATIC GREATER THAN 80% RIGHT CAROTID STENOSIS: Given the progression of the right carotid stenosis to greater than 80% I have recommended right carotid endarterectomy in order to lower her risk of future stroke.I have reviewed the indications for carotid endarterectomy, that is to lower the risk of future stroke. I have also reviewed the potential complications of surgery, including but not limited to: bleeding, stroke (perioperative risk 1-2%), MI, nerve injury of other unpredictable medical problems. All of the patients questions were answered and they are agreeable to proceed with surgery. Her surgery has been scheduled for September 23.    [name] V[facility] and Vein Specialists of [place]: [phone]

## 2015-03-02 ENCOUNTER — Other Ambulatory Visit: Payer: Self-pay

## 2015-03-03 ENCOUNTER — Other Ambulatory Visit: Payer: Self-pay | Admitting: Physical Medicine and Rehabilitation

## 2015-03-03 DIAGNOSIS — M545 Low back pain: Secondary | ICD-10-CM

## 2015-03-08 ENCOUNTER — Encounter (HOSPITAL_COMMUNITY)
Admission: RE | Admit: 2015-03-08 | Discharge: 2015-03-08 | Disposition: A | Discharge status: Home / Self Care | Payer: Medicare Other | Source: Ambulatory Visit | Attending: Nephrology | Admitting: Nephrology

## 2015-03-08 ENCOUNTER — Encounter: Payer: Self-pay | Admitting: Interventional Cardiology

## 2015-03-08 DIAGNOSIS — D631 Anemia in chronic kidney disease: Secondary | ICD-10-CM | POA: Diagnosis not present

## 2015-03-08 DIAGNOSIS — N184 Chronic kidney disease, stage 4 (severe): Secondary | ICD-10-CM | POA: Diagnosis not present

## 2015-03-08 LAB — POCT HEMOGLOBIN-HEMACUE: Hemoglobin: 10.2 g/dL — ABNORMAL LOW (ref 12.0–15.0)

## 2015-03-08 MED ORDER — EPOETIN ALFA 20000 UNIT/ML IJ SOLN
INTRAMUSCULAR | Status: AC
  Filled 2015-03-08: qty 1

## 2015-03-08 MED ORDER — EPOETIN ALFA 20000 UNIT/ML IJ SOLN
20000.0000 [IU] | INTRAMUSCULAR | Status: DC
  Administered 2015-03-08: 20000 [IU] via SUBCUTANEOUS

## 2015-03-09 DIAGNOSIS — D631 Anemia in chronic kidney disease: Secondary | ICD-10-CM | POA: Diagnosis not present

## 2015-03-09 DIAGNOSIS — N184 Chronic kidney disease, stage 4 (severe): Secondary | ICD-10-CM | POA: Diagnosis not present

## 2015-03-09 DIAGNOSIS — N2581 Secondary hyperparathyroidism of renal origin: Secondary | ICD-10-CM | POA: Diagnosis not present

## 2015-03-09 DIAGNOSIS — I129 Hypertensive chronic kidney disease with stage 1 through stage 4 chronic kidney disease, or unspecified chronic kidney disease: Secondary | ICD-10-CM | POA: Diagnosis not present

## 2015-03-12 ENCOUNTER — Ambulatory Visit
Admission: RE | Admit: 2015-03-12 | Discharge: 2015-03-12 | Disposition: A | Discharge status: Home / Self Care | Payer: Medicare Other | Source: Ambulatory Visit | Attending: Physical Medicine and Rehabilitation | Admitting: Physical Medicine and Rehabilitation

## 2015-03-12 DIAGNOSIS — M47816 Spondylosis without myelopathy or radiculopathy, lumbar region: Secondary | ICD-10-CM | POA: Diagnosis not present

## 2015-03-12 DIAGNOSIS — M545 Low back pain: Secondary | ICD-10-CM

## 2015-03-12 DIAGNOSIS — M5136 Other intervertebral disc degeneration, lumbar region: Secondary | ICD-10-CM | POA: Diagnosis not present

## 2015-03-15 DIAGNOSIS — M5416 Radiculopathy, lumbar region: Secondary | ICD-10-CM | POA: Diagnosis not present

## 2015-03-15 DIAGNOSIS — M4806 Spinal stenosis, lumbar region: Secondary | ICD-10-CM | POA: Diagnosis not present

## 2015-03-22 ENCOUNTER — Other Ambulatory Visit: Payer: Self-pay | Admitting: Interventional Cardiology

## 2015-03-22 DIAGNOSIS — M5416 Radiculopathy, lumbar region: Secondary | ICD-10-CM | POA: Diagnosis not present

## 2015-03-22 DIAGNOSIS — M4806 Spinal stenosis, lumbar region: Secondary | ICD-10-CM | POA: Diagnosis not present

## 2015-03-30 ENCOUNTER — Encounter (HOSPITAL_COMMUNITY)
Admission: RE | Admit: 2015-03-30 | Discharge: 2015-03-30 | Disposition: A | Discharge status: Home / Self Care | Payer: Medicare Other | Source: Ambulatory Visit | Attending: Vascular Surgery | Admitting: Vascular Surgery

## 2015-03-30 ENCOUNTER — Encounter (HOSPITAL_COMMUNITY)
Admission: RE | Admit: 2015-03-30 | Discharge: 2015-03-30 | Disposition: A | Discharge status: Home / Self Care | Payer: Medicare Other | Source: Ambulatory Visit | Attending: Nephrology | Admitting: Nephrology

## 2015-03-30 ENCOUNTER — Encounter (HOSPITAL_COMMUNITY): Payer: Self-pay

## 2015-03-30 DIAGNOSIS — D631 Anemia in chronic kidney disease: Secondary | ICD-10-CM | POA: Insufficient documentation

## 2015-03-30 DIAGNOSIS — Z79899 Other long term (current) drug therapy: Secondary | ICD-10-CM | POA: Insufficient documentation

## 2015-03-30 DIAGNOSIS — I4891 Unspecified atrial fibrillation: Secondary | ICD-10-CM | POA: Insufficient documentation

## 2015-03-30 DIAGNOSIS — Z8673 Personal history of transient ischemic attack (TIA), and cerebral infarction without residual deficits: Secondary | ICD-10-CM | POA: Diagnosis not present

## 2015-03-30 DIAGNOSIS — Z951 Presence of aortocoronary bypass graft: Secondary | ICD-10-CM | POA: Insufficient documentation

## 2015-03-30 DIAGNOSIS — I120 Hypertensive chronic kidney disease with stage 5 chronic kidney disease or end stage renal disease: Secondary | ICD-10-CM | POA: Diagnosis not present

## 2015-03-30 DIAGNOSIS — Z0183 Encounter for blood typing: Secondary | ICD-10-CM | POA: Insufficient documentation

## 2015-03-30 DIAGNOSIS — Z01818 Encounter for other preprocedural examination: Secondary | ICD-10-CM | POA: Insufficient documentation

## 2015-03-30 DIAGNOSIS — Z01812 Encounter for preprocedural laboratory examination: Secondary | ICD-10-CM | POA: Insufficient documentation

## 2015-03-30 DIAGNOSIS — N185 Chronic kidney disease, stage 5: Secondary | ICD-10-CM | POA: Insufficient documentation

## 2015-03-30 DIAGNOSIS — I2510 Atherosclerotic heart disease of native coronary artery without angina pectoris: Secondary | ICD-10-CM | POA: Insufficient documentation

## 2015-03-30 DIAGNOSIS — Z7982 Long term (current) use of aspirin: Secondary | ICD-10-CM | POA: Diagnosis not present

## 2015-03-30 DIAGNOSIS — N184 Chronic kidney disease, stage 4 (severe): Secondary | ICD-10-CM | POA: Diagnosis not present

## 2015-03-30 DIAGNOSIS — K219 Gastro-esophageal reflux disease without esophagitis: Secondary | ICD-10-CM | POA: Diagnosis not present

## 2015-03-30 DIAGNOSIS — I6521 Occlusion and stenosis of right carotid artery: Secondary | ICD-10-CM | POA: Insufficient documentation

## 2015-03-30 DIAGNOSIS — Z794 Long term (current) use of insulin: Secondary | ICD-10-CM | POA: Diagnosis not present

## 2015-03-30 HISTORY — DX: Cardiac arrhythmia, unspecified: I49.9

## 2015-03-30 HISTORY — DX: Adverse effect of unspecified anesthetic, initial encounter: T41.45XA

## 2015-03-30 HISTORY — DX: Personal history of pneumonia (recurrent): Z87.01

## 2015-03-30 HISTORY — DX: Other complications of anesthesia, initial encounter: T88.59XA

## 2015-03-30 LAB — COMPREHENSIVE METABOLIC PANEL
ALT: 14 U/L (ref 14–54)
ANION GAP: 14 (ref 5–15)
AST: 16 U/L (ref 15–41)
Albumin: 4 g/dL (ref 3.5–5.0)
Alkaline Phosphatase: 51 U/L (ref 38–126)
BILIRUBIN TOTAL: 0.8 mg/dL (ref 0.3–1.2)
BUN: 148 mg/dL — AB (ref 6–20)
CHLORIDE: 97 mmol/L — AB (ref 101–111)
CO2: 26 mmol/L (ref 22–32)
Calcium: 8.8 mg/dL — ABNORMAL LOW (ref 8.9–10.3)
Creatinine, Ser: 4.4 mg/dL — ABNORMAL HIGH (ref 0.44–1.00)
GFR, EST AFRICAN AMERICAN: 10 mL/min — AB (ref >60)
GFR, EST NON AFRICAN AMERICAN: 9 mL/min — AB (ref >60)
Glucose, Bld: 136 mg/dL — ABNORMAL HIGH (ref 65–99)
POTASSIUM: 3.7 mmol/L (ref 3.5–5.1)
Sodium: 137 mmol/L (ref 135–145)
TOTAL PROTEIN: 6.8 g/dL (ref 6.5–8.1)

## 2015-03-30 LAB — POCT HEMOGLOBIN-HEMACUE
HEMOGLOBIN: 11.3 g/dL — AB (ref 12.0–15.0)
Hemoglobin: 11.3 g/dL — ABNORMAL LOW (ref 12.0–15.0)

## 2015-03-30 LAB — GLUCOSE, CAPILLARY: Glucose-Capillary: 118 mg/dL — ABNORMAL HIGH (ref 65–99)

## 2015-03-30 LAB — CBC
HEMATOCRIT: 35.6 % — AB (ref 36.0–46.0)
Hemoglobin: 11.8 g/dL — ABNORMAL LOW (ref 12.0–15.0)
MCH: 32.2 pg (ref 26.0–34.0)
MCHC: 33.1 g/dL (ref 30.0–36.0)
MCV: 97.3 fL (ref 78.0–100.0)
PLATELETS: 152 10*3/uL (ref 150–400)
RBC: 3.66 MIL/uL — ABNORMAL LOW (ref 3.87–5.11)
RDW: 14.4 % (ref 11.5–15.5)
WBC: 10.6 10*3/uL — AB (ref 4.0–10.5)

## 2015-03-30 LAB — SURGICAL PCR SCREEN
MRSA, PCR: NEGATIVE
Staphylococcus aureus: NEGATIVE

## 2015-03-30 LAB — PROTIME-INR
INR: 1.1 (ref 0.00–1.49)
PROTHROMBIN TIME: 14.4 s (ref 11.6–15.2)

## 2015-03-30 LAB — TYPE AND SCREEN
ABO/RH(D): O POS
ANTIBODY SCREEN: NEGATIVE

## 2015-03-30 LAB — APTT: aPTT: 25 seconds (ref 24–37)

## 2015-03-30 MED ORDER — EPOETIN ALFA 20000 UNIT/ML IJ SOLN: 20000.0000 [IU] | INTRAMUSCULAR | Status: DC

## 2015-03-30 MED ORDER — EPOETIN ALFA 20000 UNIT/ML IJ SOLN
INTRAMUSCULAR | Status: AC
  Administered 2015-03-30: 20000 [IU] via SUBCUTANEOUS
  Filled 2015-03-30: qty 1

## 2015-03-30 NOTE — Progress Notes (Signed)
Patient and husband states that they check patient's blood sugar in the morning and at bedtime.  Patient's husband reports that patient does not take Lantus at night and that she takes approximately 22-24 units of Humalog at bedtime.  Husband also states that patient takes 20-22 units of Lantus in the morning depending blood sugar.    Diabetes coordinator and Ebony Hail notified of patient's diabetes medication regimen.  Records and medication regimen from Dr. Inda Merlin' office have been requested.  Patient instructed to take 10 units of Lantus the morning of surgery and if Humalog is needed the evening prior, to take it with dinner and not bed time.    Patient and husband verbalized understanding.

## 2015-03-30 NOTE — Progress Notes (Signed)
PCP - Dr. Josetta Huddle Cardiologist - Dr. Casandra Doffing  EKG- 05/31/2015 - Epic CXR- 06/01/2015 - Epic  Echo- 06/2011 -Epic Stress Test - patient denies Cardiac Cath - 07/2011  Pt. 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

## 2015-03-30 NOTE — Progress Notes (Signed)
Spoke with Ulis Rias, RN at Dr. Nicole Cella office to confirm patient's instructions for 325 Aspirin. Per Ulis Rias, patient is to continue taking Aspirin prior to surgery but not to take the morning of.  Patient verbalized understanding.

## 2015-03-30 NOTE — Pre-Procedure Instructions (Signed)
[name]  03/30/2015      [place], [place] - [address] [place] [address] Phone: [phone] Fax: [phone]    Your procedure is scheduled on Friday, September 23rd, 2016  Report to [facility] Admitting at 5:30 A.M.  Call this number if you have problems the morning of surgery:  [phone]   Remember:  Do not eat food or drink liquids after midnight.   Take these medicines the morning of surgery with A SIP OF WATER: Acetaminophen (Tylenol) if needed, Allopurinol (Zyloprim), Amlodipine (Norvasc),  Brimonidine (Alphagan) eye drop, Citalopram (Celexa), Cochicine, Hydrocodone-acetaminophen (Norco/Vicodin), Metoprolol (Lopressor), Omeprazole (Priolosec), Oxycodone (Roxicodone), Timolol (eye drop), Travoprost (eye drop).  Stop taking: Ibuprofen, Aleve, Naproxen, Motrin, Advil, Fish Oil, all herbal medications, and all vitamins.      What do I do about my diabetes medications?   Do not take oral diabetes medicines (pills) the morning of surgery.    Do not take Humalog at bedtime the night before surgery.     THE MORNING OF SURGERY, take 10 units of Lantus Insulin.    Do not take other diabetes injectables the day of surgery including Byetta, Victoza, Bydureon, and Trulicity.    If your CBG is greater than 220 mg/dL, you may take 1/2 of your sliding scale (correction) dose of insulin.     Do not wear jewelry, make-up or nail polish.  Do not wear lotions, powders, or perfumes.  You may wear deodorant.  Do not shave 48 hours prior to surgery.    Do not bring valuables to the hospital.  [facility] is not responsible for any belongings or valuables.  Contacts, dentures or bridgework may not be worn into surgery.  Leave your suitcase in the car.  After surgery it may be brought to your room.  For patients admitted to the hospital, discharge time will be determined by your treatment team.  Patients  discharged the day of surgery will not be allowed to drive home.   Special instructions:  See attached.   Please read over the following fact sheets that you were given. Pain Booklet, Coughing and Deep Breathing, Blood Transfusion Information, MRSA Information and Surgical Site Infection Prevention     How to Manage Your Diabetes Before Surgery   Why is it important to control my blood sugar before and after surgery?   Improving blood sugar levels before and after surgery helps healing and can limit problems.  A way of improving blood sugar control is eating a healthy diet by:  - Eating less sugar and carbohydrates  - Increasing activity/exercise  - Talk with your doctor about reaching your blood sugar goals  High blood sugars (greater than 180 mg/dL) can raise your risk of infections and slow down your recovery so you will need to focus on controlling your diabetes during the weeks before surgery.  Make sure that the doctor who takes care of your diabetes knows about your planned surgery including the date and location.  How do I manage my blood sugars before surgery?   Check your blood sugar at least 4 times a day, 2 days before surgery to make sure that they are not too high or low.   Check your blood sugar the morning of your surgery when you wake up and every 2 hours until you get to the Short-Stay unit.  If your blood sugar is less than 70 mg/dL, you will need to  treat for low blood sugar by:  Treat a low blood sugar (less than 70 mg/dL) with 1/2 cup of clear juice (cranberry or apple), 4 glucose tablets, OR glucose gel.  Recheck blood sugar in 15 minutes after treatment (to make sure it is greater than 70 mg/dL).  If blood sugar is not greater than 70 mg/dL on re-check, call [phone] for further instructions.   Report your blood sugar to the Short-Stay nurse when you get to Short-Stay.  References:  [facility], 2007 "How to Manage your  Diabetes Before and After Surgery".

## 2015-03-31 LAB — HEMOGLOBIN A1C
HEMOGLOBIN A1C: 6.6 % — AB (ref 4.8–5.6)
MEAN PLASMA GLUCOSE: 143 mg/dL

## 2015-03-31 NOTE — Progress Notes (Addendum)
Anesthesia Chart Review:  Pt is 74 year old female scheduled for R CEA on 04/07/2015 with Dr. Scot Dock.   Cardiologist is Dr. Irish Lack, last office visit 06/08/15. One year follow up scheduled for 06/2015. Nephrologist is Dr. Mercy Moore, last office visit 03/09/15.  PMH includes: CAD (s/p CABG 07/2011: LIMA-LAD; SVG-DIAG; SVG-RCA), HTN, carotid artery occlusion, atrial fibrillation, TIA, anemia, CKD (stage V, not yet on dialysis), GERD. Never smoker. BMI 31. S/p insertion AV graft 09/01/14. S/p creation of L AV fistula 05/31/14.   Medications include: amlodipine, ASA, lipitor, lasix, humalog, lantus, linagliptin, losartan, metoprolol, prilosec, timolol.   Preoperative labs reviewed.  HgbA1c 6.6, glucose 118. Cr 4.4, BUN 148; most recent results from Dr. Etheleen Nicks office were Cr 3.12, BUN 35 on 03/09/15.    Chest x-ray 05/31/2014 reviewed. Suspect small bilateral pleural effusions but improved lung volumes and ventilation compared to 2013. No overt edema.  EKG 05/31/2014: Sinus bradycardia (58 bpm). RBBB. ST & T wave abnormality, consider lateral ischemia.  Appears unchanged when compared to tracing dated 02/25/13.   Carotid duplex US 02/24/2015:  -R common carotid artery disease in distal segment of at least 50% -R ICA stenosis present in 80-99% range -R external carotid artery stenosis -L ICA is patent with hx of CEA, no hyperplasia or hemodynamically significant stenosis present.   Echo 07/15/2011:  1. LV EF 60-65% 2. Mild mitral regurgitation. 3. Mild concentric LVH 4. Mild LA enlargement  Have sent BMET results to Dr. Mercy Moore for review and input.   Willeen Cass, FNP-BC [facility] Short Stay [facility]/Anesthesiology Phone: [phone] 03/31/2015 4:27 PM  Addendum: Call received from [place] with [facility]. Dr. Mercy Moore is out of the office this week, but she reviewed labs with Dr. Jimmy Footman who felt there was no renal contraindication to proceed with  surgery but recommended avoiding contrast and monitoring BP carefully throughout patient's perioperative course. I'll route this message to Dr. Scot Dock for his review as well.   George Hugh [facility] Short Stay Center/Anesthesiology Phone [phone] 04/03/2015 4:30 PM  Addendum: Doristine Bosworth called back from [place]. Dr. Mercy Moore has since been able to review patient's labs, and think that surgery should be placed on hold due to her elevated BUN 148. Nephrology is going to make some changes in her regimen. I asked that Doristine Bosworth also notify Dr. Nicole Cella office. I will send a staff message as well.  George Hugh [facility] Short Stay Center/Anesthesiology Phone [phone] 04/05/2015 10:48 AM

## 2015-04-10 DIAGNOSIS — N184 Chronic kidney disease, stage 4 (severe): Secondary | ICD-10-CM | POA: Diagnosis not present

## 2015-04-11 ENCOUNTER — Other Ambulatory Visit: Payer: Self-pay

## 2015-04-18 ENCOUNTER — Other Ambulatory Visit (HOSPITAL_COMMUNITY): Payer: Self-pay | Admitting: *Deleted

## 2015-04-18 NOTE — Progress Notes (Addendum)
Anesthesia Chart Review: Patient is 74 year old female scheduled for right CEA on 04/21/15 by Dr. Scot Dock. Surgery was initially scheduled for 04/07/15, but was postponed by her nephrologist Dr. Mercy Moore due to increasing BUN/Cr on PAT labs (see previous PAT/anesthesia note). Repeat labs at [facility] on 04/10/15 showed improvement, so surgery is now rescheduled with PAT scheduled for 04/19/15.  History includes CAD (s/p CABG 07/29/11: LIMA-LAD; SVG-DIAG; SVG-RCA), HTN, DM2, carotid artery occlusive disease, atrial fibrillation (not recent documentation seen about this), TIA, glaucoma, anemia, CKD (stage V, not yet on dialysis but s/p LUE AVGG 09/01/14), GERD, non-smoker, melanoma (head), SOB, depression. Reports prior history of being difficult to wake up after anesthesia.  PCP is listed as Dr. Josetta Huddle. Nephrologist is Dr. Mercy Moore at [facility] Cardiologist is Dr. Irish Lack, last office visit 06/08/15. One year follow up scheduled for 06/2015.  Medications include amlodipine, ASA, Lipitor, Lasix, Humalog, Lantus, linagliptin, losartan, metoprolol, Prilosec, timolol, Celexa, fish oil, Neurontin, Norco, Travatan, brimonidine.   05/31/14 EKG: Sinus bradycardia (58 bpm). RBBB. ST & T wave abnormality, consider lateral ischemia. Appears unchanged when compared to tracing dated 02/25/13.   Carotid duplex US 02/24/2015:  -R common carotid artery disease in distal segment of at least 50% -R ICA stenosis present in 80-99% range -R external carotid artery stenosis -L ICA is patent with hx of CEA, no hyperplasia or hemodynamically significant stenosis present.   Echo 07/15/2011:  1. LV EF 60-65% 2. Mild mitral regurgitation. 3. Mild concentric LVH 4. Mild LA enlargement  Last LHC was pre-CABG on 07/24/11 (see Notes tab for report).  05/31/14 Chest x-ray: Suspect small bilateral pleural effusions but improved lung volumes and ventilation compared to 2013. No overt edema.  She will get  labs at PAT. A1C was 6.6 on 03/30/15. Cr was 2.39, BUN 92 on 04/10/15 at [place]. This was improved from Cr 4.40 and BUN 148 on 03/30/15 (had previously been Cr 3.12 and BUN 35 on 03/09/15 at [facility]) which prompted Dr. Mercy Moore make some adjustments in her medication regimen prior to rescheduling surgery.   Additional follow-up pending her PAT visit.  George Hugh [facility] Short Stay Center/Anesthesiology Phone [phone] 04/18/2015 5:59 PM  Addendum: Repeat Cr yesterday was 3.16, which seems right around her baseline when compared to prior labs from [place] (last two were 3.12 and 2.39). BUN is still elevated, but continues to trend down from 148--was later 92 and now 80. H/H 11.8/34.5. PT/PTT WNL. Glucose 147.   George Hugh [facility] Short Stay Center/Anesthesiology Phone [phone] 04/20/2015 10:54 AM

## 2015-04-19 ENCOUNTER — Encounter (HOSPITAL_COMMUNITY)
Admission: RE | Admit: 2015-04-19 | Discharge: 2015-04-19 | Disposition: A | Discharge status: Home / Self Care | Payer: Medicare Other | Source: Ambulatory Visit | Attending: Vascular Surgery | Admitting: Vascular Surgery

## 2015-04-19 ENCOUNTER — Encounter (HOSPITAL_COMMUNITY)
Admission: RE | Admit: 2015-04-19 | Discharge: 2015-04-19 | Disposition: A | Discharge status: Home / Self Care | Payer: Medicare Other | Source: Ambulatory Visit | Attending: Nephrology | Admitting: Nephrology

## 2015-04-19 ENCOUNTER — Encounter (HOSPITAL_COMMUNITY): Payer: Self-pay

## 2015-04-19 DIAGNOSIS — N184 Chronic kidney disease, stage 4 (severe): Secondary | ICD-10-CM | POA: Insufficient documentation

## 2015-04-19 DIAGNOSIS — D631 Anemia in chronic kidney disease: Secondary | ICD-10-CM | POA: Insufficient documentation

## 2015-04-19 LAB — URINALYSIS, ROUTINE W REFLEX MICROSCOPIC
Bilirubin Urine: NEGATIVE
Glucose, UA: NEGATIVE mg/dL
Hgb urine dipstick: NEGATIVE
Ketones, ur: NEGATIVE mg/dL
NITRITE: NEGATIVE
Protein, ur: NEGATIVE mg/dL
SPECIFIC GRAVITY, URINE: 1.01 (ref 1.005–1.030)
UROBILINOGEN UA: 0.2 mg/dL (ref 0.0–1.0)
pH: 5.5 (ref 5.0–8.0)

## 2015-04-19 LAB — IRON AND TIBC
Iron: 89 ug/dL (ref 28–170)
SATURATION RATIOS: 27 % (ref 10.4–31.8)
TIBC: 333 ug/dL (ref 250–450)
UIBC: 244 ug/dL

## 2015-04-19 LAB — COMPREHENSIVE METABOLIC PANEL
ALBUMIN: 3.7 g/dL (ref 3.5–5.0)
ALK PHOS: 59 U/L (ref 38–126)
ALT: 16 U/L (ref 14–54)
AST: 18 U/L (ref 15–41)
Anion gap: 15 (ref 5–15)
BILIRUBIN TOTAL: 0.6 mg/dL (ref 0.3–1.2)
BUN: 80 mg/dL — AB (ref 6–20)
CALCIUM: 9.6 mg/dL (ref 8.9–10.3)
CO2: 27 mmol/L (ref 22–32)
Chloride: 95 mmol/L — ABNORMAL LOW (ref 101–111)
Creatinine, Ser: 3.16 mg/dL — ABNORMAL HIGH (ref 0.44–1.00)
GFR calc Af Amer: 15 mL/min — ABNORMAL LOW (ref >60)
GFR, EST NON AFRICAN AMERICAN: 13 mL/min — AB (ref >60)
GLUCOSE: 147 mg/dL — AB (ref 65–99)
Potassium: 4 mmol/L (ref 3.5–5.1)
Sodium: 137 mmol/L (ref 135–145)
TOTAL PROTEIN: 6.9 g/dL (ref 6.5–8.1)

## 2015-04-19 LAB — PROTIME-INR
INR: 1 (ref 0.00–1.49)
Prothrombin Time: 13.4 seconds (ref 11.6–15.2)

## 2015-04-19 LAB — TYPE AND SCREEN
ABO/RH(D): O POS
Antibody Screen: NEGATIVE

## 2015-04-19 LAB — CBC
HEMATOCRIT: 34.5 % — AB (ref 36.0–46.0)
HEMOGLOBIN: 11.8 g/dL — AB (ref 12.0–15.0)
MCH: 33.2 pg (ref 26.0–34.0)
MCHC: 34.2 g/dL (ref 30.0–36.0)
MCV: 97.2 fL (ref 78.0–100.0)
Platelets: 151 10*3/uL (ref 150–400)
RBC: 3.55 MIL/uL — ABNORMAL LOW (ref 3.87–5.11)
RDW: 14.4 % (ref 11.5–15.5)
WBC: 8.1 10*3/uL (ref 4.0–10.5)

## 2015-04-19 LAB — GLUCOSE, CAPILLARY: GLUCOSE-CAPILLARY: 153 mg/dL — AB (ref 65–99)

## 2015-04-19 LAB — APTT: APTT: 28 s (ref 24–37)

## 2015-04-19 LAB — URINE MICROSCOPIC-ADD ON

## 2015-04-19 LAB — POCT HEMOGLOBIN-HEMACUE: HEMOGLOBIN: 11.3 g/dL — AB (ref 12.0–15.0)

## 2015-04-19 LAB — FERRITIN: Ferritin: 300 ng/mL (ref 11–307)

## 2015-04-19 MED ORDER — EPOETIN ALFA 20000 UNIT/ML IJ SOLN
INTRAMUSCULAR | Status: AC
Start: 2015-04-19 — End: 2015-04-19
  Filled 2015-04-19: qty 1

## 2015-04-19 MED ORDER — EPOETIN ALFA 20000 UNIT/ML IJ SOLN
20000.0000 [IU] | INTRAMUSCULAR | Status: DC
  Administered 2015-04-19: 20000 [IU] via SUBCUTANEOUS

## 2015-04-19 NOTE — Progress Notes (Signed)
Mrs. Ayad here today for update on history and new lab work,since surgery cancelled in September.Insulin regimen unclear from talking with pt. And husband.  Will have pt. Take insulin as directed from previous pre-op visit. No Humalog  the night before  And 10 units Lantus the morning of surgery.  Husband asked to bring in Humalog sliding scale he uses at night the day of surgery.

## 2015-04-20 MED ORDER — SODIUM CHLORIDE 0.9 % IV SOLN
INTRAVENOUS | Status: DC
  Administered 2015-04-21 (×2): via INTRAVENOUS

## 2015-04-20 MED ORDER — CHLORHEXIDINE GLUCONATE CLOTH 2 % EX PADS: 6.0000 | MEDICATED_PAD | Freq: Once | CUTANEOUS | Status: DC

## 2015-04-20 MED ORDER — DEXTROSE 5 % IV SOLN
1.5000 g | INTRAVENOUS | Status: AC
  Administered 2015-04-21: 1.5 g via INTRAVENOUS
  Administered 2015-04-21: 08:00:00 via INTRAVENOUS
  Filled 2015-04-20: qty 1.5

## 2015-04-21 ENCOUNTER — Inpatient Hospital Stay (HOSPITAL_COMMUNITY): Payer: Medicare Other | Admitting: Certified Registered Nurse Anesthetist

## 2015-04-21 ENCOUNTER — Inpatient Hospital Stay (HOSPITAL_COMMUNITY): Payer: Medicare Other | Admitting: Vascular Surgery

## 2015-04-21 ENCOUNTER — Encounter (HOSPITAL_COMMUNITY): Payer: Self-pay | Admitting: Certified Registered Nurse Anesthetist

## 2015-04-21 ENCOUNTER — Encounter (HOSPITAL_COMMUNITY)
Admission: RE | Disposition: A | Discharge status: Home / Self Care | Payer: Self-pay | Source: Ambulatory Visit | Attending: Vascular Surgery

## 2015-04-21 ENCOUNTER — Inpatient Hospital Stay (HOSPITAL_COMMUNITY)
Admission: RE | Admit: 2015-04-21 | Discharge: 2015-04-22 | DRG: 039 | Disposition: A | Discharge status: Home / Self Care | Payer: Medicare Other | Source: Ambulatory Visit | Attending: Vascular Surgery | Admitting: Vascular Surgery

## 2015-04-21 DIAGNOSIS — Z8249 Family history of ischemic heart disease and other diseases of the circulatory system: Secondary | ICD-10-CM | POA: Diagnosis not present

## 2015-04-21 DIAGNOSIS — F329 Major depressive disorder, single episode, unspecified: Secondary | ICD-10-CM | POA: Diagnosis present

## 2015-04-21 DIAGNOSIS — Z794 Long term (current) use of insulin: Secondary | ICD-10-CM | POA: Diagnosis not present

## 2015-04-21 DIAGNOSIS — Z79899 Other long term (current) drug therapy: Secondary | ICD-10-CM

## 2015-04-21 DIAGNOSIS — I2510 Atherosclerotic heart disease of native coronary artery without angina pectoris: Secondary | ICD-10-CM | POA: Diagnosis present

## 2015-04-21 DIAGNOSIS — I4891 Unspecified atrial fibrillation: Secondary | ICD-10-CM | POA: Diagnosis not present

## 2015-04-21 DIAGNOSIS — I129 Hypertensive chronic kidney disease with stage 1 through stage 4 chronic kidney disease, or unspecified chronic kidney disease: Secondary | ICD-10-CM | POA: Diagnosis present

## 2015-04-21 DIAGNOSIS — N189 Chronic kidney disease, unspecified: Secondary | ICD-10-CM | POA: Diagnosis present

## 2015-04-21 DIAGNOSIS — I6529 Occlusion and stenosis of unspecified carotid artery: Secondary | ICD-10-CM | POA: Diagnosis present

## 2015-04-21 DIAGNOSIS — I6521 Occlusion and stenosis of right carotid artery: Secondary | ICD-10-CM

## 2015-04-21 DIAGNOSIS — Z8673 Personal history of transient ischemic attack (TIA), and cerebral infarction without residual deficits: Secondary | ICD-10-CM | POA: Diagnosis not present

## 2015-04-21 DIAGNOSIS — Z7982 Long term (current) use of aspirin: Secondary | ICD-10-CM

## 2015-04-21 DIAGNOSIS — E1122 Type 2 diabetes mellitus with diabetic chronic kidney disease: Secondary | ICD-10-CM | POA: Diagnosis not present

## 2015-04-21 DIAGNOSIS — H9190 Unspecified hearing loss, unspecified ear: Secondary | ICD-10-CM | POA: Diagnosis present

## 2015-04-21 DIAGNOSIS — Z833 Family history of diabetes mellitus: Secondary | ICD-10-CM

## 2015-04-21 DIAGNOSIS — H409 Unspecified glaucoma: Secondary | ICD-10-CM | POA: Diagnosis present

## 2015-04-21 DIAGNOSIS — K219 Gastro-esophageal reflux disease without esophagitis: Secondary | ICD-10-CM | POA: Diagnosis present

## 2015-04-21 HISTORY — PX: ENDARTERECTOMY: SHX5162

## 2015-04-21 LAB — GLUCOSE, CAPILLARY
GLUCOSE-CAPILLARY: 139 mg/dL — AB (ref 65–99)
GLUCOSE-CAPILLARY: 109 mg/dL — AB (ref 65–99)
Glucose-Capillary: 95 mg/dL (ref 65–99)
Glucose-Capillary: 133 mg/dL — ABNORMAL HIGH (ref 65–99)

## 2015-04-21 SURGERY — ENDARTERECTOMY, CAROTID
Anesthesia: General | Laterality: Right

## 2015-04-21 MED ORDER — PROPOFOL 10 MG/ML IV BOLUS
INTRAVENOUS | Status: AC
  Filled 2015-04-21: qty 20

## 2015-04-21 MED ORDER — FENTANYL CITRATE (PF) 100 MCG/2ML IJ SOLN
INTRAMUSCULAR | Status: AC
  Filled 2015-04-21: qty 2

## 2015-04-21 MED ORDER — LABETALOL HCL 5 MG/ML IV SOLN: 10.0000 mg | INTRAVENOUS | Status: DC | PRN

## 2015-04-21 MED ORDER — ASPIRIN 325 MG PO TABS
325.0000 mg | ORAL_TABLET | Freq: Every day | ORAL | Status: DC
  Administered 2015-04-22: 325 mg via ORAL
  Filled 2015-04-21: qty 1

## 2015-04-21 MED ORDER — INFLUENZA VAC SPLIT QUAD 0.5 ML IM SUSY: 0.5000 mL | PREFILLED_SYRINGE | INTRAMUSCULAR | Status: DC | PRN

## 2015-04-21 MED ORDER — OMEGA-3 FATTY ACIDS 1000 MG PO CAPS: 2.0000 g | ORAL_CAPSULE | Freq: Every day | ORAL | Status: DC

## 2015-04-21 MED ORDER — SUCCINYLCHOLINE CHLORIDE 20 MG/ML IJ SOLN
INTRAMUSCULAR | Status: AC
  Filled 2015-04-21: qty 1

## 2015-04-21 MED ORDER — LIDOCAINE-EPINEPHRINE (PF) 1 %-1:200000 IJ SOLN
INTRAMUSCULAR | Status: DC | PRN
  Administered 2015-04-21: 10 mL

## 2015-04-21 MED ORDER — PNEUMOCOCCAL VAC POLYVALENT 25 MCG/0.5ML IJ INJ: 0.5000 mL | INJECTION | INTRAMUSCULAR | Status: DC | PRN

## 2015-04-21 MED ORDER — SUCCINYLCHOLINE CHLORIDE 20 MG/ML IJ SOLN
INTRAMUSCULAR | Status: DC | PRN
  Administered 2015-04-21: 100 mg via INTRAVENOUS

## 2015-04-21 MED ORDER — HYDRALAZINE HCL 20 MG/ML IJ SOLN: 5.0000 mg | INTRAMUSCULAR | Status: DC | PRN

## 2015-04-21 MED ORDER — BRIMONIDINE TARTRATE 0.2 % OP SOLN
1.0000 [drp] | Freq: Two times a day (BID) | OPHTHALMIC | Status: DC
  Administered 2015-04-21: 1 [drp] via OPHTHALMIC
  Filled 2015-04-21: qty 5

## 2015-04-21 MED ORDER — LIDOCAINE HCL 4 % MT SOLN
OROMUCOSAL | Status: DC | PRN
  Administered 2015-04-21: 4 mL via TOPICAL

## 2015-04-21 MED ORDER — PROTAMINE SULFATE 10 MG/ML IV SOLN
INTRAVENOUS | Status: DC | PRN
Start: 2015-04-21 — End: 2015-04-21
  Administered 2015-04-21: 40 mg via INTRAVENOUS
  Administered 2015-04-21: 10 mg via INTRAVENOUS

## 2015-04-21 MED ORDER — METOPROLOL TARTRATE 50 MG PO TABS
50.0000 mg | ORAL_TABLET | Freq: Two times a day (BID) | ORAL | Status: DC
  Administered 2015-04-21 – 2015-04-22 (×2): 50 mg via ORAL
  Filled 2015-04-21 (×3): qty 1

## 2015-04-21 MED ORDER — SODIUM CHLORIDE 0.9 % IV SOLN: 500.0000 mL | Freq: Once | INTRAVENOUS | Status: DC | PRN

## 2015-04-21 MED ORDER — SODIUM CHLORIDE 0.9 % IJ SOLN
INTRAMUSCULAR | Status: AC
  Filled 2015-04-21: qty 10

## 2015-04-21 MED ORDER — ARTIFICIAL TEARS OP OINT
TOPICAL_OINTMENT | OPHTHALMIC | Status: DC | PRN
  Administered 2015-04-21: 1 via OPHTHALMIC

## 2015-04-21 MED ORDER — GLYCOPYRROLATE 0.2 MG/ML IJ SOLN
INTRAMUSCULAR | Status: DC | PRN
  Administered 2015-04-21: 0.6 mg via INTRAVENOUS

## 2015-04-21 MED ORDER — ONDANSETRON HCL 4 MG/2ML IJ SOLN
INTRAMUSCULAR | Status: AC
  Filled 2015-04-21: qty 2

## 2015-04-21 MED ORDER — ONDANSETRON HCL 4 MG/2ML IJ SOLN
INTRAMUSCULAR | Status: DC | PRN
  Administered 2015-04-21: 4 mg via INTRAVENOUS

## 2015-04-21 MED ORDER — PROPOFOL 10 MG/ML IV BOLUS
INTRAVENOUS | Status: DC | PRN
  Administered 2015-04-21: 100 mg via INTRAVENOUS

## 2015-04-21 MED ORDER — TRAVOPROST 0.004 % OP SOLN: 1.0000 [drp] | Freq: Every day | OPHTHALMIC | Status: DC

## 2015-04-21 MED ORDER — HYDROCODONE-ACETAMINOPHEN 5-325 MG PO TABS
1.0000 | ORAL_TABLET | ORAL | Status: DC | PRN
  Administered 2015-04-21 – 2015-04-22 (×3): 1 via ORAL
  Filled 2015-04-21 (×2): qty 1

## 2015-04-21 MED ORDER — CALCITRIOL 0.25 MCG PO CAPS
0.2500 ug | ORAL_CAPSULE | ORAL | Status: DC
  Administered 2015-04-22: 0.25 ug via ORAL
  Filled 2015-04-21: qty 1

## 2015-04-21 MED ORDER — ARTIFICIAL TEARS OP OINT
TOPICAL_OINTMENT | OPHTHALMIC | Status: AC
  Filled 2015-04-21: qty 3.5

## 2015-04-21 MED ORDER — FENTANYL CITRATE (PF) 250 MCG/5ML IJ SOLN
INTRAMUSCULAR | Status: AC
  Filled 2015-04-21: qty 5

## 2015-04-21 MED ORDER — PANTOPRAZOLE SODIUM 40 MG PO TBEC
40.0000 mg | DELAYED_RELEASE_TABLET | Freq: Every day | ORAL | Status: DC
Start: 2015-04-21 — End: 2015-04-22
  Administered 2015-04-21 – 2015-04-22 (×2): 40 mg via ORAL
  Filled 2015-04-21 (×2): qty 1

## 2015-04-21 MED ORDER — ACETAMINOPHEN 500 MG PO TABS
500.0000 mg | ORAL_TABLET | Freq: Four times a day (QID) | ORAL | Status: DC | PRN
  Administered 2015-04-21: 500 mg via ORAL
  Filled 2015-04-21: qty 1

## 2015-04-21 MED ORDER — DEXTRAN 40 IN SALINE 10-0.9 % IV SOLN
INTRAVENOUS | Status: DC | PRN
  Administered 2015-04-21: 839 mL

## 2015-04-21 MED ORDER — ONDANSETRON HCL 4 MG/2ML IJ SOLN: 4.0000 mg | Freq: Four times a day (QID) | INTRAMUSCULAR | Status: DC | PRN

## 2015-04-21 MED ORDER — AMLODIPINE BESYLATE 10 MG PO TABS
10.0000 mg | ORAL_TABLET | Freq: Every day | ORAL | Status: DC
  Administered 2015-04-22: 10 mg via ORAL
  Filled 2015-04-21: qty 1

## 2015-04-21 MED ORDER — BISACODYL 10 MG RE SUPP: 10.0000 mg | Freq: Every day | RECTAL | Status: DC | PRN

## 2015-04-21 MED ORDER — ALLOPURINOL 100 MG PO TABS
200.0000 mg | ORAL_TABLET | Freq: Every day | ORAL | Status: DC
  Administered 2015-04-22: 200 mg via ORAL
  Filled 2015-04-21: qty 2

## 2015-04-21 MED ORDER — LIDOCAINE HCL (PF) 1 % IJ SOLN
INTRAMUSCULAR | Status: AC
  Filled 2015-04-21: qty 30

## 2015-04-21 MED ORDER — DEXTROSE 5 % IV SOLN
750.0000 mg | Freq: Two times a day (BID) | INTRAVENOUS | Status: AC
  Administered 2015-04-21 – 2015-04-22 (×2): 750 mg via INTRAVENOUS
  Filled 2015-04-21 (×2): qty 750

## 2015-04-21 MED ORDER — HYDROCODONE-ACETAMINOPHEN 5-325 MG PO TABS
ORAL_TABLET | ORAL | Status: AC
  Filled 2015-04-21: qty 1

## 2015-04-21 MED ORDER — SODIUM CHLORIDE 0.9 % IJ SOLN
3.0000 mL | Freq: Two times a day (BID) | INTRAMUSCULAR | Status: DC
  Administered 2015-04-22: 3 mL via INTRAVENOUS

## 2015-04-21 MED ORDER — SODIUM CHLORIDE 0.9 % IV SOLN
0.0125 ug/kg/min | INTRAVENOUS | Status: DC
  Administered 2015-04-21: .1 ug/kg/min via INTRAVENOUS
  Filled 2015-04-21: qty 1000

## 2015-04-21 MED ORDER — LIDOCAINE HCL (CARDIAC) 20 MG/ML IV SOLN
INTRAVENOUS | Status: AC
  Filled 2015-04-21: qty 5

## 2015-04-21 MED ORDER — SODIUM CHLORIDE 0.9 % IV SOLN: 250.0000 mL | INTRAVENOUS | Status: DC | PRN

## 2015-04-21 MED ORDER — LIDOCAINE-EPINEPHRINE (PF) 1 %-1:200000 IJ SOLN
INTRAMUSCULAR | Status: AC
  Filled 2015-04-21: qty 30

## 2015-04-21 MED ORDER — HYDROCODONE-ACETAMINOPHEN 5-325 MG PO TABS: 1.0000 | ORAL_TABLET | ORAL | Status: DC | PRN

## 2015-04-21 MED ORDER — LINAGLIPTIN 5 MG PO TABS
5.0000 mg | ORAL_TABLET | Freq: Every day | ORAL | Status: DC
  Administered 2015-04-22: 5 mg via ORAL
  Filled 2015-04-21 (×2): qty 1

## 2015-04-21 MED ORDER — SODIUM CHLORIDE 0.9 % IJ SOLN: 3.0000 mL | INTRAMUSCULAR | Status: DC | PRN

## 2015-04-21 MED ORDER — DEXTROSE 5 % IV SOLN
1.5000 g | Freq: Two times a day (BID) | INTRAVENOUS | Status: DC
  Filled 2015-04-21 (×2): qty 1.5

## 2015-04-21 MED ORDER — ROCURONIUM BROMIDE 100 MG/10ML IV SOLN
INTRAVENOUS | Status: DC | PRN
Start: 2015-04-21 — End: 2015-04-21
  Administered 2015-04-21: 30 mg via INTRAVENOUS

## 2015-04-21 MED ORDER — VITAMIN D 50 MCG (2000 UT) PO TABS: 2000.0000 [IU] | ORAL_TABLET | Freq: Two times a day (BID) | ORAL | Status: DC

## 2015-04-21 MED ORDER — 0.9 % SODIUM CHLORIDE (POUR BTL) OPTIME
TOPICAL | Status: DC | PRN
  Administered 2015-04-21: 2000 mL

## 2015-04-21 MED ORDER — NEOSTIGMINE METHYLSULFATE 10 MG/10ML IV SOLN
INTRAVENOUS | Status: DC | PRN
  Administered 2015-04-21: 4 mg via INTRAVENOUS

## 2015-04-21 MED ORDER — FENTANYL CITRATE (PF) 100 MCG/2ML IJ SOLN
INTRAMUSCULAR | Status: DC | PRN
  Administered 2015-04-21 (×2): 50 ug via INTRAVENOUS

## 2015-04-21 MED ORDER — GLYCOPYRROLATE 0.2 MG/ML IJ SOLN
INTRAMUSCULAR | Status: AC
  Filled 2015-04-21: qty 3

## 2015-04-21 MED ORDER — GUAIFENESIN-DM 100-10 MG/5ML PO SYRP: 15.0000 mL | ORAL_SOLUTION | ORAL | Status: DC | PRN

## 2015-04-21 MED ORDER — SODIUM CHLORIDE 0.9 % IV SOLN
INTRAVENOUS | Status: DC | PRN
  Administered 2015-04-21: 08:00:00

## 2015-04-21 MED ORDER — EPHEDRINE SULFATE 50 MG/ML IJ SOLN
INTRAMUSCULAR | Status: AC
  Filled 2015-04-21: qty 1

## 2015-04-21 MED ORDER — SODIUM CHLORIDE 0.9 % IV SOLN: INTRAVENOUS | Status: DC

## 2015-04-21 MED ORDER — INSULIN ASPART 100 UNIT/ML ~~LOC~~ SOLN
0.0000 [IU] | Freq: Three times a day (TID) | SUBCUTANEOUS | Status: DC
Start: 2015-04-22 — End: 2015-04-22

## 2015-04-21 MED ORDER — VITAMIN D3 25 MCG (1000 UNIT) PO TABS
2000.0000 [IU] | ORAL_TABLET | Freq: Every day | ORAL | Status: DC
  Administered 2015-04-22: 2000 [IU] via ORAL
  Filled 2015-04-21: qty 2

## 2015-04-21 MED ORDER — METOPROLOL TARTRATE 1 MG/ML IV SOLN
2.0000 mg | INTRAVENOUS | Status: DC | PRN
Start: 2015-04-21 — End: 2015-04-22

## 2015-04-21 MED ORDER — FUROSEMIDE 80 MG PO TABS
80.0000 mg | ORAL_TABLET | Freq: Two times a day (BID) | ORAL | Status: DC
  Administered 2015-04-21 – 2015-04-22 (×2): 80 mg via ORAL
  Filled 2015-04-21 (×4): qty 1

## 2015-04-21 MED ORDER — INSULIN GLARGINE 100 UNIT/ML ~~LOC~~ SOLN
20.0000 [IU] | Freq: Every day | SUBCUTANEOUS | Status: DC
  Administered 2015-04-21: 20 [IU] via SUBCUTANEOUS
  Filled 2015-04-21 (×2): qty 0.2

## 2015-04-21 MED ORDER — FENTANYL CITRATE (PF) 100 MCG/2ML IJ SOLN
25.0000 ug | INTRAMUSCULAR | Status: DC | PRN
  Administered 2015-04-21: 25 ug via INTRAVENOUS

## 2015-04-21 MED ORDER — ATORVASTATIN CALCIUM 10 MG PO TABS
10.0000 mg | ORAL_TABLET | Freq: Every day | ORAL | Status: DC
  Administered 2015-04-21: 10 mg via ORAL
  Filled 2015-04-21 (×2): qty 1

## 2015-04-21 MED ORDER — OMEGA-3-ACID ETHYL ESTERS 1 G PO CAPS
2.0000 g | ORAL_CAPSULE | Freq: Every day | ORAL | Status: DC
  Administered 2015-04-22: 2 g via ORAL
  Filled 2015-04-21: qty 2

## 2015-04-21 MED ORDER — TIMOLOL MALEATE 0.5 % OP SOLN
1.0000 [drp] | Freq: Every day | OPHTHALMIC | Status: DC
  Administered 2015-04-21: 1 [drp] via OPHTHALMIC
  Filled 2015-04-21: qty 5

## 2015-04-21 MED ORDER — PHENYLEPHRINE HCL 10 MG/ML IJ SOLN
10.0000 mg | INTRAMUSCULAR | Status: DC | PRN
  Administered 2015-04-21: 10 ug/min via INTRAVENOUS

## 2015-04-21 MED ORDER — THROMBIN 20000 UNITS EX SOLR
CUTANEOUS | Status: DC | PRN
  Administered 2015-04-21: 09:00:00 via TOPICAL

## 2015-04-21 MED ORDER — DEXTRAN 40 IN SALINE 10-0.9 % IV SOLN
INTRAVENOUS | Status: AC
  Filled 2015-04-21: qty 500

## 2015-04-21 MED ORDER — CITALOPRAM HYDROBROMIDE 20 MG PO TABS
20.0000 mg | ORAL_TABLET | Freq: Every day | ORAL | Status: DC
  Administered 2015-04-22: 20 mg via ORAL
  Filled 2015-04-21: qty 1

## 2015-04-21 MED ORDER — ONDANSETRON HCL 4 MG/2ML IJ SOLN: 4.0000 mg | Freq: Once | INTRAMUSCULAR | Status: DC | PRN

## 2015-04-21 MED ORDER — MORPHINE SULFATE (PF) 2 MG/ML IV SOLN: 2.0000 mg | INTRAVENOUS | Status: DC | PRN

## 2015-04-21 MED ORDER — DOCUSATE SODIUM 100 MG PO CAPS
100.0000 mg | ORAL_CAPSULE | Freq: Every day | ORAL | Status: DC
  Administered 2015-04-22: 100 mg via ORAL
  Filled 2015-04-21: qty 1

## 2015-04-21 MED ORDER — THROMBIN 20000 UNITS EX SOLR
CUTANEOUS | Status: AC
  Filled 2015-04-21: qty 20000

## 2015-04-21 MED ORDER — ROCURONIUM BROMIDE 50 MG/5ML IV SOLN
INTRAVENOUS | Status: AC
  Filled 2015-04-21: qty 1

## 2015-04-21 MED ORDER — SENNOSIDES-DOCUSATE SODIUM 8.6-50 MG PO TABS
1.0000 | ORAL_TABLET | Freq: Every evening | ORAL | Status: DC | PRN
  Filled 2015-04-21: qty 1

## 2015-04-21 MED ORDER — HEPARIN SODIUM (PORCINE) 1000 UNIT/ML IJ SOLN
INTRAMUSCULAR | Status: DC | PRN
  Administered 2015-04-21: 8000 [IU] via INTRAVENOUS

## 2015-04-21 MED ORDER — LATANOPROST 0.005 % OP SOLN
1.0000 [drp] | Freq: Every day | OPHTHALMIC | Status: DC
  Administered 2015-04-21: 1 [drp] via OPHTHALMIC
  Filled 2015-04-21: qty 2.5

## 2015-04-21 MED ORDER — PHENOL 1.4 % MT LIQD: 1.0000 | OROMUCOSAL | Status: DC | PRN

## 2015-04-21 MED ORDER — NEOSTIGMINE METHYLSULFATE 10 MG/10ML IV SOLN
INTRAVENOUS | Status: AC
  Filled 2015-04-21: qty 1

## 2015-04-21 MED ORDER — MIDAZOLAM HCL 2 MG/2ML IJ SOLN
INTRAMUSCULAR | Status: AC
  Filled 2015-04-21: qty 4

## 2015-04-21 SURGICAL SUPPLY — 37 items
BAG DECANTER FOR FLEXI CONT (MISCELLANEOUS) ×3 IMPLANT
CANISTER SUCTION 2500CC (MISCELLANEOUS) ×3 IMPLANT
CANNULA VESSEL 3MM 2 BLNT TIP (CANNULA) ×6 IMPLANT
CATH ROBINSON RED A/P 18FR (CATHETERS) ×3 IMPLANT
CLIP TI MEDIUM 24 (CLIP) ×3 IMPLANT
CLIP TI WIDE RED SMALL 24 (CLIP) ×3 IMPLANT
CRADLE DONUT ADULT HEAD (MISCELLANEOUS) ×3 IMPLANT
DRAIN CHANNEL 15F RND FF W/TCR (WOUND CARE) IMPLANT
DRSG COVADERM 4X6 (GAUZE/BANDAGES/DRESSINGS) ×3 IMPLANT
ELECT REM PT RETURN 9FT ADLT (ELECTROSURGICAL) ×3
ELECTRODE REM PT RTRN 9FT ADLT (ELECTROSURGICAL) ×1 IMPLANT
EVACUATOR SILICONE 100CC (DRAIN) IMPLANT
GLOVE BIO SURGEON STRL SZ7.5 (GLOVE) ×9 IMPLANT
GLOVE BIOGEL PI IND STRL 8 (GLOVE) ×1 IMPLANT
GLOVE BIOGEL PI INDICATOR 8 (GLOVE) ×2
GOWN STRL REUS W/ TWL LRG LVL3 (GOWN DISPOSABLE) ×4 IMPLANT
GOWN STRL REUS W/TWL LRG LVL3 (GOWN DISPOSABLE) ×8
KIT BASIN OR (CUSTOM PROCEDURE TRAY) ×3 IMPLANT
KIT ROOM TURNOVER OR (KITS) ×3 IMPLANT
LIQUID BAND (GAUZE/BANDAGES/DRESSINGS) ×3 IMPLANT
NEEDLE HYPO 25X1 1.5 SAFETY (NEEDLE) ×3 IMPLANT
NS IRRIG 1000ML POUR BTL (IV SOLUTION) ×6 IMPLANT
PACK CAROTID (CUSTOM PROCEDURE TRAY) ×3 IMPLANT
PAD ARMBOARD 7.5X6 YLW CONV (MISCELLANEOUS) ×6 IMPLANT
PATCH HEMASHIELD 8X150 (Vascular Products) ×3 IMPLANT
SHUNT CAROTID BYPASS 10 (VASCULAR PRODUCTS) ×3 IMPLANT
SHUNT CAROTID BYPASS 12FRX15.5 (VASCULAR PRODUCTS) IMPLANT
SPONGE INTESTINAL PEANUT (DISPOSABLE) IMPLANT
SPONGE SURGIFOAM ABS GEL 100 (HEMOSTASIS) ×3 IMPLANT
SUT PROLENE 6 0 BV (SUTURE) ×3 IMPLANT
SUT PROLENE 7 0 BV 1 (SUTURE) ×3 IMPLANT
SUT SILK 2 0 FS (SUTURE) IMPLANT
SUT VIC AB 3-0 SH 27 (SUTURE) ×2
SUT VIC AB 3-0 SH 27X BRD (SUTURE) ×1 IMPLANT
SUT VICRYL 4-0 PS2 18IN ABS (SUTURE) ×3 IMPLANT
SYR CONTROL 10ML LL (SYRINGE) ×3 IMPLANT
WATER STERILE IRR 1000ML POUR (IV SOLUTION) ×3 IMPLANT

## 2015-04-21 NOTE — Transfer of Care (Signed)
Immediate Anesthesia Transfer of Care Note  Patient: [name]  Procedure(s) Performed: Procedure(s): ENDARTERECTOMY CAROTID (Right)  Patient Location: PACU  Anesthesia Type:General  Level of Consciousness: awake, alert , oriented and patient cooperative  Airway & Oxygen Therapy: Patient Spontanous Breathing and Patient connected to nasal cannula oxygen  Post-op Assessment: Report given to RN, Post -op Vital signs reviewed and stable, Patient moving all extremities X 4 and Patient able to stick tongue midline  Post vital signs: Reviewed and stable  Last Vitals:  BP 142/42 per Aline HR 82 SpO2 100% on 2L [place] RR 12 Resting comfortably, denies discomfort.  Complications: No apparent anesthesia complications

## 2015-04-21 NOTE — Op Note (Signed)
    NAME: [name]   MRN: [id number] DOB: 1941/07/02    DATE OF OPERATION: 04/21/2015  PREOP DIAGNOSIS: Asymptomatic greater than 80% right carotid stenosis  POSTOP DIAGNOSIS: Same  PROCEDURE: Right carotid endarterectomy with Dacron patch angioplasty  SURGEON: Judeth Cornfield. Scot Dock, MD, FACS  ASSIST: Leontine Locket, PA  ANESTHESIA: Gen.   EBL: minimal  INDICATIONS: [name] is a 74 y.o. female who I had been following with carotid disease. Her right carotid stenosis progressed to greater than 80% and right carotid endarterectomy was recommended in order to lower her risk of future stroke.  FINDINGS: 80% right carotid stenosis. Calcified plaque extended into the common carotid artery.  TECHNIQUE: The patient was taken to the operating room after an arterial line was placed by anesthesia. The patient received a general anesthetic. The right neck was prepped and draped in the usual sterile fashion. Of note, she had limited neck mobility which made the dissection more difficult. An incision was made along the anterior border of the sternocleidomastoid and the dissection carried down to the common carotid artery which was dissected free and controlled with a Rummel tourniquet. The facial vein was divided between 2-0 silk ties. The internal carotid artery was identified and controlled with a vessel loop. The common carotid artery was controlled with a Rummel tourniquet and the external carotid artery and superior thyroid arteries were controlled. The patient was heparinized. Clamps were then placed on the internal, then the common, then the external carotid artery. A longitudinal arteriotomy was made in the common carotid artery and this was extended through the plaque into the internal carotid artery. A 10 shunt was placed into the internal carotid artery, backbled and then placed into the common carotid artery and secured with Rummel tourniquet. Flow was reestablished to the shunt. Of  note the plaque extended fairly low into the common carotid artery and had to extend the arteriotomy fairly low on the common carotid artery to get below the plaque. An endarterectomy plane was established proximally and the plaque was sharply divided. Eversion endarterectomy was performed of the external carotid artery. Distally was a nice taper and the plaque and no tacking sutures were required. The artery was irrigated with copious amounts of heparin and dextran and all loose debris removed. A long Dacron patch was then sewn using continuous 6-0 prolene suture. Prior to completing the patch closure, the shunt was removed. The artery was backbled and flushed appropriately and the anastomosis completed. Flow was reestablished first to the external carotid artery and into the internal carotid artery. At the completion was a good Doppler signal distal to the patch with good diastolic flow. The heparin was partially reversed with protamine. The wound was closed with a deep layer of 3-0 Vicryl. The platysma was closed with running 3-0 Vicryl. The skin was closed with a 4-0 subcuticular stitch. Liquiband was applied. The patient tolerated the procedure well and was transferred to the recovery room in stable condition. All needle and sponge counts were correct.  [name], MD, FACS [facility]  DATE OF DICTATION:   04/21/2015

## 2015-04-21 NOTE — Anesthesia Procedure Notes (Signed)
Procedure Name: Intubation Date/Time: 04/21/2015 7:35 AM Performed by: Willeen Cass P Pre-anesthesia Checklist: Patient identified, Timeout performed, Emergency Drugs available, Suction available and Patient being monitored Patient Re-evaluated:Patient Re-evaluated prior to inductionOxygen Delivery Method: Circle system utilized Preoxygenation: Pre-oxygenation with 100% oxygen Intubation Type: IV induction Ventilation: Mask ventilation without difficulty Laryngoscope Size: Mac and 3 Grade View: Grade I Tube type: Oral Tube size: 7.0 mm Number of attempts: 1 Airway Equipment and Method: Stylet Placement Confirmation: ETT inserted through vocal cords under direct vision,  breath sounds checked- equal and bilateral and positive ETCO2 Secured at: 22 cm Tube secured with: Tape Dental Injury: Teeth and Oropharynx as per pre-operative assessment  Difficulty Due To: Difficult Airway- due to reduced neck mobility Future Recommendations: Recommend- induction with short-acting agent, and alternative techniques readily available

## 2015-04-21 NOTE — H&P (Signed)
Vascular and Vein Specialist of [facility]  Patient name: Debra Bester SteeleMRN: [id number] DOB: 08/25/1942Sex: female  REASON FOR ADMISSION: Right Carotid Stenosis  HPI: [name] is a 74 y.o. female who I have been following with carotid disease. She had a left carotid endarterectomy in 2009. I have been following a moderate right carotid stenosis. I last saw her on 08/17/2014 to follow her carotid disease. At that time she had an asymptomatic 60-79% right carotid stenosis. There was no evidence of recurrent stenosis on the left. I recommended a follow up study in 6 months.  She denies any history of stroke, TIAs, expressive or receptive aphasia, or amaurosis fugax. She is on aspirin and is on a statin. She is right-handed.  Her kidney function has been stable and she is not yet on dialysis.  Of note, I performed a new left upper arm graft on this patient on 09/01/2014.  Past Medical History  Diagnosis Date  . Anemia   . Glaucoma   . Hypertension   . Blood transfusion   . GERD (gastroesophageal reflux disease)   . Arthritis   . Neuromuscular disorder     CARPEL TUNNEL  . Chronic kidney disease   . Atrial fibrillation   . CAD (coronary artery disease)   . Diabetes mellitus without complication   . Carotid artery occlusion     Carotid Endartectom,y - left 2009. Blockage Right being watched by Dr Scot Dock.  Marland Kitchen HOH (hard of hearing)   . Stroke     hx of TIA  . Shortness of breath   . Depression   . History of kidney stones     passed  . Cancer     . top of head- melonoma  . Carotid stenosis    Family History  Problem Relation Age of Onset  . Diabetes Mother   . Hypertension Mother   . Heart disease Mother     beofre age 62  . Heart attack Mother   . Cancer Father   . Hyperlipidemia Father   . Hypertension Father   . Deep  vein thrombosis Daughter   . Diabetes Daughter   . Hyperlipidemia Daughter   . Hypertension Daughter   . Heart disease Daughter   . Peripheral vascular disease Daughter    SOCIAL HISTORY: Social History  Substance Use Topics  . Smoking status: Never Smoker   . Smokeless tobacco: Never Used  . Alcohol Use: No   No Known Allergies Current Outpatient Prescriptions  Medication Sig Dispense Refill  . acetaminophen (TYLENOL) 500 MG tablet Take 500 mg by mouth every 6 (six) hours as needed. For pain    . allopurinol (ZYLOPRIM) 100 MG tablet Take 200 mg by mouth daily.     Marland Kitchen amLODipine (NORVASC) 10 MG tablet TAKE ONE TABLET BY MOUTH ONE TIME DAILY 30 tablet 6  . aspirin 325 MG tablet Take 325 mg by mouth daily.    Marland Kitchen atorvastatin (LIPITOR) 10 MG tablet TAKE ONE TABLET BY MOUTH ONE TIME DAILY 30 tablet 0  . B Complex Vitamins (VITAMIN B COMPLEX PO) Take 1 tablet by mouth daily.    . brimonidine (ALPHAGAN P) 0.1 % SOLN Place 1 drop into the left eye 2 (two) times daily.    . Cholecalciferol (VITAMIN D) 2000 UNITS tablet Take 2,000 Units by mouth 2 (two) times daily.    . citalopram (CELEXA) 20 MG tablet Take 20 mg by mouth daily.    . colchicine 0.6 MG tablet Take  0.6 mg by mouth 2 (two) times daily. gout    . fish oil-omega-3 fatty acids 1000 MG capsule Take 2 g by mouth daily.    . furosemide (LASIX) 40 MG tablet Take 80 mg by mouth 2 (two) times daily.     Marland Kitchen gabapentin (NEURONTIN) 300 MG capsule daily.  1  . HUMALOG 100 UNIT/ML injection Inject 0-12 Units into the skin 3 (three) times daily with meals. Sliding scale    . HYDROcodone-acetaminophen (NORCO/VICODIN) 5-325 MG per tablet 2 (two) times daily.  0  . insulin glargine (LANTUS) 100 UNIT/ML injection Inject 30 Units into the skin at bedtime.    Marland Kitchen linagliptin (TRADJENTA) 5 MG TABS tablet Take 1 tablet (5 mg total) by  mouth daily with breakfast. 30 tablet 1  . losartan (COZAAR) 100 MG tablet Take 100 mg by mouth daily.    . metoprolol (LOPRESSOR) 50 MG tablet Take 50 mg by mouth 2 (two) times daily.    Marland Kitchen omeprazole (PRILOSEC) 20 MG capsule Take 20 mg by mouth daily as needed. Acid reflux    . timolol (TIMOPTIC) 0.5 % ophthalmic solution Place 1 drop into the left eye daily.    . travoprost, benzalkonium, (TRAVATAN) 0.004 % ophthalmic solution Place 1 drop into the left eye at bedtime.    . insulin glargine (LANTUS) 100 UNIT/ML injection Inject 30 Units into the skin every morning. 10 mL 1  . metoprolol (LOPRESSOR) 50 MG tablet Take 1 tablet (50 mg total) by mouth 2 (two) times daily. 60 tablet 1  . oxyCODONE (ROXICODONE) 5 MG immediate release tablet Take 1 tablet (5 mg total) by mouth every 6 (six) hours as needed. (Patient not taking: Reported on 03/01/2015) 20 tablet 0   No current facility-administered medications for this visit.   REVIEW OF SYSTEMS: Valu.Nieves ] denotes positive finding; [ ]  denotes negative finding  CARDIOVASCULAR: [ ]  chest pain [ ]  chest pressure [ ]  palpitations [ ]  orthopnea  [ ]  dyspnea on exertion Valu.Nieves ] claudication [ ]  rest pain [ ]  DVT [ ]  phlebitis PULMONARY: [ ]  productive cough [ ]  asthma [ ]  wheezing NEUROLOGIC: [ ]  weakness [ ]  paresthesias [ ]  aphasia [ ]  amaurosis [ ]  dizziness HEMATOLOGIC: [ ]  bleeding problems [ ]  clotting disorders MUSCULOSKELETAL: [ ]  joint pain [ ]  joint swelling [ ]  leg swelling GASTROINTESTINAL: [ ]  blood in stool [ ]  hematemesis GENITOURINARY: [ ]  dysuria [ ]  hematuria PSYCHIATRIC: [ ]  history of major depression INTEGUMENTARY: [ ]  rashes [ ]  ulcers CONSTITUTIONAL: [ ]  fever [ ]  chills  PHYSICAL EXAM: Filed Vitals:   03/01/15 1540  BP: 125/46  Pulse: 54  Temp: 98.2 F (36.8 C)  TempSrc: Oral  Resp: 14  Height: 5' 4.5" (1.638  m)  Weight: 196 lb (88.905 kg)  SpO2: 99%   GENERAL: The patient is a well-nourished female, in no acute distress. The vital signs are documented above. CARDIAC: There is a regular rate and rhythm.  VASCULAR: She has a right carotid bruit. She has a palpable thrill in her left upper arm graft. PULMONARY: There is good air exchange bilaterally without wheezing or rales. ABDOMEN: Soft and non-tender with normal pitched bowel sounds.  MUSCULOSKELETAL: There are no major deformities or cyanosis. NEUROLOGIC: No focal weakness or paresthesias are detected. SKIN: There are no ulcers or rashes noted. PSYCHIATRIC: The patient has a normal affect.  DATA:  CAROTID DUPLEX: I have reviewed the carotid duplex scan which was performed on 02/24/2015.  This shows that the right carotid stenosis progressed to greater than 80%. There is no evidence of recurrent left carotid stenosis. Both vertebral arteries are patent with antegrade flow.  MEDICAL ISSUES:  ASYMPTOMATIC GREATER THAN 80% RIGHT CAROTID STENOSIS: Given the progression of the right carotid stenosis to greater than 80% I have recommended right carotid endarterectomy in order to lower her risk of future stroke.I have reviewed the indications for carotid endarterectomy, that is to lower the risk of future stroke. I have also reviewed the potential complications of surgery, including but not limited to: bleeding, stroke (perioperative risk 1-2%), MI, nerve injury of other unpredictable medical problems. All of the patients questions were answered and they are agreeable to proceed with surgery.    [name] [facility] [place]: [phone]

## 2015-04-21 NOTE — Anesthesia Postprocedure Evaluation (Signed)
  Anesthesia Post-op Note  Patient: [name]  Procedure(s) Performed: Procedure(s) (LRB): ENDARTERECTOMY CAROTID (Right)  Patient Location: PACU  Anesthesia Type: General  Level of Consciousness: awake and alert   Airway and Oxygen Therapy: Patient Spontanous Breathing  Post-op Pain: mild  Post-op Assessment: Post-op Vital signs reviewed, Patient's Cardiovascular Status Stable, Respiratory Function Stable, Patent Airway and No signs of Nausea or vomiting  Last Vitals:  Filed Vitals:   04/21/15 1006  BP:   Pulse:   Temp: 36.9 C  Resp: 13    Post-op Vital Signs: stable   Complications: No apparent anesthesia complications

## 2015-04-21 NOTE — Progress Notes (Signed)
   VASCULAR SURGERY POST OP NOTE:  * Doing well post op.  *  Anticipate discharge in AM  SUBJECTIVE: No complaints  PHYSICAL EXAM: Filed Vitals:   04/21/15 1115 04/21/15 1130 04/21/15 1145 04/21/15 1221  BP:      Pulse: 74 73 74 73  Temp:      Resp: 11 13 15 12   Weight:      SpO2: 100% 100% 99% 99%   Neuro intact. Incision looks fine.  LABS: Lab Results  Component Value Date   WBC 8.1 04/19/2015   HGB 11.8* 04/19/2015   HCT 34.5* 04/19/2015   MCV 97.2 04/19/2015   PLT 151 04/19/2015   Lab Results  Component Value Date   CREATININE 3.16* 04/19/2015   Lab Results  Component Value Date   INR 1.00 04/19/2015   CBG (last 3)   Recent Labs  04/19/15 1134 04/21/15 0602 04/21/15 1017  GLUCAP 153* 139* 109*    Active Problems:   Carotid artery stenosis   Gae Gallop Beeper: [id number] 04/21/2015

## 2015-04-21 NOTE — Consult Note (Signed)
PHARMACY CONSULT NOTE  Consult  :  Post Procedure Dosing of Zinacef Indication :  Empiric Post-Procedure Antibiotic Prophylaxis   Pharmacy consulted for post-procedure dosing of Zinacef s/p R-CEA.    Patient is to receive Zinacef x 2 doses.  Wt 90.5 kg,  Est. CrCl 16.9 ml/min.  PLAN:  1. Will adjust Zinacef doses for reduced CrCl x 2 doses, then discontinue.   2. Change Zinacef to 750 mg IV q12h x 2 doses ordered - No further dosing adjustments required. 3. Pharmacy will sign off. Please reconsult if additional assistance is needed.   Thank you for allowing Pharmacy to participate in this patient's care.   Tylie Golonka, Zettie Pho,  Pharm.D.,  04/21/2015,  7:25 PM

## 2015-04-21 NOTE — Anesthesia Preprocedure Evaluation (Addendum)
Anesthesia Evaluation  Patient identified by MRN, date of birth, ID band Patient awake    Reviewed: Allergy & Precautions, H&P , NPO status , Patient's Chart, lab work & pertinent test results  History of Anesthesia Complications Negative for: history of anesthetic complications  Airway Mallampati: IV  TM Distance: <3 FB Neck ROM: full   Comment: Small mouth opening Dental no notable dental hx.    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  Reproductive/Obstetrics negative OB ROS                            Anesthesia Physical Anesthesia Plan  ASA: IV  Anesthesia Plan: General   Post-op Pain Management:    Induction: Intravenous  Airway Management Planned: Oral ETT  Additional Equipment: Arterial line and None  Intra-op Plan:   Post-operative Plan: Extubation in OR  Informed Consent: I have reviewed the patients History and Physical, chart, labs and discussed the procedure including the risks, benefits and alternatives for the proposed anesthesia with the patient or authorized representative who has indicated his/her understanding and acceptance.   Dental Advisory Given  Plan Discussed with: Anesthesiologist, CRNA and Surgeon  Anesthesia Plan Comments:         Anesthesia Quick Evaluation

## 2015-04-22 LAB — CBC
HCT: 29.1 % — ABNORMAL LOW (ref 36.0–46.0)
HEMOGLOBIN: 9.7 g/dL — AB (ref 12.0–15.0)
MCH: 32.8 pg (ref 26.0–34.0)
MCHC: 33.3 g/dL (ref 30.0–36.0)
MCV: 98.3 fL (ref 78.0–100.0)
PLATELETS: 122 10*3/uL — AB (ref 150–400)
RBC: 2.96 MIL/uL — AB (ref 3.87–5.11)
RDW: 14.5 % (ref 11.5–15.5)
WBC: 6.8 10*3/uL (ref 4.0–10.5)

## 2015-04-22 LAB — BASIC METABOLIC PANEL
ANION GAP: 14 (ref 5–15)
BUN: 80 mg/dL — AB (ref 6–20)
CHLORIDE: 101 mmol/L (ref 101–111)
CO2: 24 mmol/L (ref 22–32)
Calcium: 8.8 mg/dL — ABNORMAL LOW (ref 8.9–10.3)
Creatinine, Ser: 3.11 mg/dL — ABNORMAL HIGH (ref 0.44–1.00)
GFR calc Af Amer: 16 mL/min — ABNORMAL LOW (ref >60)
GFR, EST NON AFRICAN AMERICAN: 14 mL/min — AB (ref >60)
Glucose, Bld: 93 mg/dL (ref 65–99)
POTASSIUM: 3.6 mmol/L (ref 3.5–5.1)
SODIUM: 139 mmol/L (ref 135–145)

## 2015-04-22 LAB — GLUCOSE, CAPILLARY
Glucose-Capillary: 95 mg/dL (ref 65–99)
Glucose-Capillary: 136 mg/dL — ABNORMAL HIGH (ref 65–99)

## 2015-04-22 NOTE — Progress Notes (Signed)
Discharge instructions given. No questions or concerns at this time. IV d/c'd. Tip intact. Pt tolerated well.  

## 2015-04-22 NOTE — Progress Notes (Addendum)
Vascular [facility] Vein Specialists of [place]  Subjective  - Doing well.  She has voided 3 times and has tolerated clears fine.     Objective 127/43 64 99.2 F (37.3 C) (Oral) 14 93%  Intake/Output Summary (Last 24 hours) at 04/22/15 0816 Last data filed at 04/22/15 0700  Gross per 24 hour  Intake   1980 ml  Output    950 ml  Net   1030 ml    No tongue deviation, smile is symmetric Grip 5/5 bil.  Right hand edema IV infiltrated.  IV D/C'sd Right neck incision C/D/I, without hematoma Heart RRR Lungs non labored breathing  Assessment/Planning: POD # 1 Right CEA After breakfast and ambulation we will discharge her home. Disposition stable     Theda Sers, EMMA MAUREEN 04/22/2015 8:16 AM --  Laboratory Lab Results:  Recent Labs  04/19/15 1247 04/22/15 0550  WBC 8.1 6.8  HGB 11.8* 9.7*  HCT 34.5* 29.1*  PLT 151 122*   BMET  Recent Labs  04/19/15 1247 04/22/15 0550  NA 137 139  K 4.0 3.6  CL 95* 101  CO2 27 24  GLUCOSE 147* 93  BUN 80* 80*  CREATININE 3.16* 3.11*  CALCIUM 9.6 8.8*    COAG Lab Results  Component Value Date   INR 1.00 04/19/2015   INR 1.10 03/30/2015   INR 1.44 07/29/2011   No results found for: PTT    I have examined the patient, reviewed and agree with above.  Curt Jews, MD 04/22/2015 11:27 AM

## 2015-04-22 NOTE — Progress Notes (Signed)
Utilization Review Completed.  

## 2015-04-24 ENCOUNTER — Encounter (HOSPITAL_COMMUNITY): Payer: Self-pay | Admitting: Vascular Surgery

## 2015-04-24 ENCOUNTER — Telehealth: Payer: Self-pay | Admitting: Vascular Surgery

## 2015-04-24 NOTE — Discharge Summary (Signed)
Vascular and Vein Specialists Discharge Summary   Patient ID:  [name] MRN: [id number] DOB/AGE: [birth year] 74 y.o.  Admit date: 04/21/2015 Discharge date: 04/21/2015 Date of Surgery: 04/21/2015 Surgeon: Surgeon(s): Angelia Mould, MD  Admission Diagnosis: Right carotid artery stenosis I65.21  Discharge Diagnoses:  Right carotid artery stenosis I65.21  Secondary Diagnoses: Past Medical History  Diagnosis Date  . Anemia   . Glaucoma   . Hypertension   . Blood transfusion   . GERD (gastroesophageal reflux disease)   . Arthritis   . Neuromuscular disorder ([place])     CARPEL TUNNEL  . Atrial fibrillation ([place])   . CAD (coronary artery disease)   . Carotid artery occlusion     Carotid Endartectom,y - left 2009.  Blockage Right being watched by Dr Scot Dock.  Marland Kitchen HOH (hard of hearing)   . Shortness of breath   . Depression   . History of kidney stones     passed  . Carotid stenosis   . Complication of anesthesia     pt. states that she was difficult to wake  . Stroke ([place])     hx of TIA  . 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 Cancer ([place])     .  top of head- melonoma  . Dysrhythmia   . History of pneumonia   . Diabetes mellitus without complication ([place])     Procedure(s): ENDARTERECTOMY CAROTID  Discharged Condition: good  HPI: [name] is a 74 y.o. female who I have been following with carotid disease. She had a left carotid endarterectomy in 2009. I have been following a moderate right carotid stenosis. I last saw her on 08/17/2014 to follow her carotid disease. At that time she had an asymptomatic 60-79% right carotid stenosis. There was no evidence of recurrent stenosis on the left. I recommended a follow up study in 6 months.  She denies any history of stroke, TIAs, expressive or receptive aphasia, or amaurosis fugax. She is on aspirin and is on a statin. She is right-handed.  Her kidney function has been stable and she is not  yet on dialysis.  Of note, I performed a new left upper arm graft on this patient on 09/01/2014. DATA:  CAROTID DUPLEX: I have reviewed the carotid duplex scan which was performed on 02/24/2015. This shows that the right carotid stenosis progressed to greater than 80%. There is no evidence of recurrent left carotid stenosis. Both vertebral arteries are patent with antegrade flow.  ASYMPTOMATIC GREATER THAN 80% RIGHT CAROTID STENOSIS: Given the progression of the right carotid stenosis to greater than 80% Dr. Scot Dock has recommended right carotid endarterectomy in order to lower her risk of future stroke.   Hospital Course:  [name] is a 74 y.o. female is S/P Right Procedure(s): ENDARTERECTOMY CAROTID POD# 1 No tongue deviation, smile is symmetric Grip 5/5 bil. Right hand edema IV infiltrated. IV D/C'sd Right neck incision C/D/I, without hematoma Heart RRR Lungs non labored breathing Stable disposition discharged home.   Significant Diagnostic Studies: CBC Lab Results  Component Value Date   WBC 6.8 04/22/2015   HGB 9.7* 04/22/2015   HCT 29.1* 04/22/2015   MCV 98.3 04/22/2015   PLT 122* 04/22/2015    BMET    Component Value Date/Time   NA 139 04/22/2015 0550   K 3.6 04/22/2015 0550   CL 101 04/22/2015 0550   CO2 24 04/22/2015 0550   GLUCOSE 93 04/22/2015 0550  BUN 80* 04/22/2015 0550   CREATININE 3.11* 04/22/2015 0550   CALCIUM 8.8* 04/22/2015 0550   GFRNONAA 14* 04/22/2015 0550   GFRAA 16* 04/22/2015 0550   COAG Lab Results  Component Value Date   INR 1.00 04/19/2015   INR 1.10 03/30/2015   INR 1.44 07/29/2011     Disposition:  Discharge to :Home Discharge Instructions    CAROTID Sugery: Call MD for difficulty swallowing or speaking; weakness in arms or legs that is a new symtom; severe headache.  If you have increased swelling in the neck and/or  are having difficulty breathing, CALL 911    Complete by:  As directed      Call MD for:   redness, tenderness, or signs of infection (pain, swelling, bleeding, redness, odor or green/yellow discharge around incision site)    Complete by:  As directed      Call MD for:  severe or increased pain, loss or decreased feeling  in affected limb(s)    Complete by:  As directed      Call MD for:  temperature >100.5    Complete by:  As directed      Discharge patient    Complete by:  As directed   Discharge pt to home once she ambulates and tolerates breakfast     Discharge wound care:    Complete by:  As directed   Shower daily with soap and water starting 04/23/15     Driving Restrictions    Complete by:  As directed   No driving for 2 weeks     Lifting restrictions    Complete by:  As directed   No lifting for 2 weeks     Resume previous diet    Complete by:  As directed             Medication List    TAKE these medications        acetaminophen 500 MG tablet  Commonly known as:  TYLENOL  Take 500 mg by mouth every 6 (six) hours as needed. For pain     allopurinol 100 MG tablet  Commonly known as:  ZYLOPRIM  Take 200 mg by mouth daily.     amLODipine 10 MG tablet  Commonly known as:  NORVASC  TAKE ONE TABLET BY MOUTH ONE TIME DAILY     aspirin 325 MG tablet  Take 325 mg by mouth daily.     atorvastatin 10 MG tablet  Commonly known as:  LIPITOR  TAKE ONE TABLET BY MOUTH ONE TIME DAILY     brimonidine 0.1 % Soln  Commonly known as:  ALPHAGAN P  Place 1 drop into the left eye 2 (two) times daily.     calcitRIOL 0.25 MCG capsule  Commonly known as:  ROCALTROL  Take 0.25 mcg by mouth every other day.     citalopram 20 MG tablet  Commonly known as:  CELEXA  Take 20 mg by mouth daily.     colchicine 0.6 MG tablet  Take 0.6 mg by mouth 2 (two) times daily. gout     fish oil-omega-3 fatty acids 1000 MG capsule  Take 2 g by mouth daily.     furosemide 40 MG tablet  Commonly known as:  LASIX  Take 80 mg by mouth 2 (two) times daily.     gabapentin 300 MG  capsule  Commonly known as:  NEURONTIN  daily.     HUMALOG 100 UNIT/ML injection  Generic drug:  insulin lispro  Inject 0-12 Units into the skin 3 (three) times daily with meals. Sliding scale     HYDROcodone-acetaminophen 5-325 MG tablet  Commonly known as:  NORCO/VICODIN  Take 1-2 tablets by mouth every 4 (four) hours as needed for moderate pain.     insulin glargine 100 UNIT/ML injection  Commonly known as:  LANTUS  Inject 20-30 Units into the skin at bedtime.     linagliptin 5 MG Tabs tablet  Commonly known as:  TRADJENTA  Take 1 tablet (5 mg total) by mouth daily with breakfast.     metoprolol 50 MG tablet  Commonly known as:  LOPRESSOR  Take 50 mg by mouth 2 (two) times daily.     omeprazole 20 MG capsule  Commonly known as:  PRILOSEC  Take 20 mg by mouth daily as needed. Acid reflux     oxyCODONE 5 MG immediate release tablet  Commonly known as:  ROXICODONE  Take 1 tablet (5 mg total) by mouth every 6 (six) hours as needed.     timolol 0.5 % ophthalmic solution  Commonly known as:  TIMOPTIC  Place 1 drop into the left eye daily.     travoprost (benzalkonium) 0.004 % ophthalmic solution  Commonly known as:  TRAVATAN  Place 1 drop into the left eye at bedtime.     VITAMIN B COMPLEX PO  Take 1 tablet by mouth daily.     Vitamin D 2000 UNITS tablet  Take 2,000 Units by mouth 2 (two) times daily.       Verbal and written Discharge instructions given to the patient. Wound care per Discharge AVS     Follow-up Information    Follow up with [name], MD In 2 weeks.   Specialties:  Vascular Surgery, Cardiology   Why:  Office will call you to arrange your appt (sent)   Contact information:   [address] [place] [phone]       Signed: Laurence Slate [facility] 04/24/2015, 10:12 AM  --- For VQI Registry use --- Instructions: Press F2 to tab through selections.  Delete question if not applicable.   Modified Rankin score at D/C  (0-6): Rankin Score=0  IV medication needed for:  1. Hypertension: No 2. Hypotension: No  Post-op Complications: No  1. Post-op CVA or TIA: No  If yes: Event classification (right eye, left eye, right cortical, left cortical, verterobasilar, other):   If yes: Timing of event (intra-op, <6 hrs post-op, >=6 hrs post-op, unknown):   2. CN injury: No  If yes: CN  injuried   3. Myocardial infarction: No  If yes: Dx by (EKG or clinical, Troponin):   4.  CHF: No  5.  Dysrhythmia (new): No  6. Wound infection: No  7. Reperfusion symptoms: No  8. Return to OR: No  If yes: return to OR for (bleeding, neurologic, other CEA incision, other):   Discharge medications: Statin use:  Yes ASA use:  Yes Beta blocker use:  Yes ACE-Inhibitor use:  No  for medical reason   P2Y12 Antagonist use: [ x] None, [ ]  Plavix, [ ]  Plasugrel, [ ]  Ticlopinine, [ ]  Ticagrelor, [ ]  Other, [ ]  No for medical reason, [ ]  Non-compliant, [ ]  Not-indicated Anti-coagulant use:  [x ] None, [ ]  Warfarin, [ ]  Rivaroxaban, [ ]  Dabigatran, [ ]  Other, [ ]  No for medical reason, [ ]  Non-compliant, [ ]  Not-indicated

## 2015-04-24 NOTE — Telephone Encounter (Addendum)
-----   Message from Gabriel Earing, [place] sent at 04/21/2015  9:56 AM EDT ----- S/p right CEA 04/21/15.  F/u with Dr. Scot Dock in 2 weeks.  Thanks, Samantha  notified patient of post op appt. with dr. Scot Dock on 05-10-15

## 2015-05-04 DIAGNOSIS — I129 Hypertensive chronic kidney disease with stage 1 through stage 4 chronic kidney disease, or unspecified chronic kidney disease: Secondary | ICD-10-CM | POA: Diagnosis not present

## 2015-05-04 DIAGNOSIS — N2581 Secondary hyperparathyroidism of renal origin: Secondary | ICD-10-CM | POA: Diagnosis not present

## 2015-05-04 DIAGNOSIS — Z23 Encounter for immunization: Secondary | ICD-10-CM | POA: Diagnosis not present

## 2015-05-04 DIAGNOSIS — D631 Anemia in chronic kidney disease: Secondary | ICD-10-CM | POA: Diagnosis not present

## 2015-05-04 DIAGNOSIS — N184 Chronic kidney disease, stage 4 (severe): Secondary | ICD-10-CM | POA: Diagnosis not present

## 2015-05-05 ENCOUNTER — Encounter: Payer: Self-pay | Admitting: Vascular Surgery

## 2015-05-10 ENCOUNTER — Encounter: Payer: Self-pay | Admitting: Vascular Surgery

## 2015-05-10 ENCOUNTER — Encounter (HOSPITAL_COMMUNITY)
Admission: RE | Admit: 2015-05-10 | Discharge: 2015-05-10 | Disposition: A | Discharge status: Home / Self Care | Payer: Medicare Other | Source: Ambulatory Visit | Attending: Nephrology | Admitting: Nephrology

## 2015-05-10 ENCOUNTER — Ambulatory Visit (INDEPENDENT_AMBULATORY_CARE_PROVIDER_SITE_OTHER): Payer: Medicare Other | Admitting: Vascular Surgery

## 2015-05-10 VITALS — BP 138/71 | HR 66 | Temp 98.2°F | Resp 22 | Ht 64.5 in | Wt 199.0 lb

## 2015-05-10 DIAGNOSIS — N184 Chronic kidney disease, stage 4 (severe): Secondary | ICD-10-CM | POA: Diagnosis not present

## 2015-05-10 DIAGNOSIS — I6523 Occlusion and stenosis of bilateral carotid arteries: Secondary | ICD-10-CM

## 2015-05-10 DIAGNOSIS — D631 Anemia in chronic kidney disease: Secondary | ICD-10-CM | POA: Diagnosis not present

## 2015-05-10 DIAGNOSIS — Z48812 Encounter for surgical aftercare following surgery on the circulatory system: Secondary | ICD-10-CM

## 2015-05-10 LAB — POCT HEMOGLOBIN-HEMACUE: HEMOGLOBIN: 9.6 g/dL — AB (ref 12.0–15.0)

## 2015-05-10 MED ORDER — EPOETIN ALFA 20000 UNIT/ML IJ SOLN
20000.0000 [IU] | INTRAMUSCULAR | Status: DC
  Administered 2015-05-10: 20000 [IU] via SUBCUTANEOUS

## 2015-05-10 MED ORDER — EPOETIN ALFA 20000 UNIT/ML IJ SOLN
INTRAMUSCULAR | Status: AC
  Filled 2015-05-10: qty 1

## 2015-05-10 NOTE — Progress Notes (Signed)
Patient name: [name] MRN: [id number] DOB: September 30, 1940 Sex: female  REASON FOR VISIT: Follow up after right carotid endarterectomy  HPI: [name] is a 74 y.o. female who I had been following with carotid disease. Her right carotid stenosis progressed to greater than 80% and right carotid endarterectomy was recommended. She underwent a right carotid endarterectomy with Dacron patch angioplasty on 04/21/2015. She returns for her first outpatient visit. She has no specific complaints except leg swelling. She denies any focal weakness or paresthesias.  She is status post previous left carotid endarterectomy and on her most recent duplex in August had no evidence of recurrent stenosis there.  Current Outpatient Prescriptions  Medication Sig Dispense Refill  . acetaminophen (TYLENOL) 500 MG tablet Take 500 mg by mouth every 6 (six) hours as needed. For pain    . allopurinol (ZYLOPRIM) 100 MG tablet Take 200 mg by mouth daily.     Marland Kitchen amLODipine (NORVASC) 10 MG tablet TAKE ONE TABLET BY MOUTH ONE TIME DAILY (Patient taking differently: TAKE 10 MG BY MOUTH ONE TIME DAILY) 30 tablet 2  . aspirin 325 MG tablet Take 325 mg by mouth daily.    Marland Kitchen atorvastatin (LIPITOR) 10 MG tablet TAKE ONE TABLET BY MOUTH ONE TIME DAILY (Patient taking differently: Take 10 mg by mouth daily. ) 30 tablet 0  . B Complex Vitamins (VITAMIN B COMPLEX PO) Take 1 tablet by mouth daily.    . brimonidine (ALPHAGAN P) 0.1 % SOLN Place 1 drop into the left eye 2 (two) times daily.    . calcitRIOL (ROCALTROL) 0.25 MCG capsule Take 0.25 mcg by mouth every other day.    . Cholecalciferol (VITAMIN D) 2000 UNITS tablet Take 2,000 Units by mouth 2 (two) times daily.    . citalopram (CELEXA) 20 MG tablet Take 20 mg by mouth daily.    . colchicine 0.6 MG tablet Take 0.6 mg by mouth 2 (two) times daily. gout    . fish oil-omega-3 fatty acids 1000 MG capsule Take 2 g by mouth daily.    . furosemide (LASIX) 40 MG tablet Take 80 mg  by mouth 2 (two) times daily.     Marland Kitchen gabapentin (NEURONTIN) 300 MG capsule daily.  1  . HUMALOG 100 UNIT/ML injection Inject 0-12 Units into the skin 3 (three) times daily with meals. Sliding scale    . HYDROcodone-acetaminophen (NORCO/VICODIN) 5-325 MG tablet Take 1-2 tablets by mouth every 4 (four) hours as needed for moderate pain. 30 tablet 0  . insulin glargine (LANTUS) 100 UNIT/ML injection Inject 20-30 Units into the skin at bedtime.     Marland Kitchen linagliptin (TRADJENTA) 5 MG TABS tablet Take 1 tablet (5 mg total) by mouth daily with breakfast. 30 tablet 1  . metoprolol (LOPRESSOR) 50 MG tablet Take 50 mg by mouth 2 (two) times daily.    Marland Kitchen omeprazole (PRILOSEC) 20 MG capsule Take 20 mg by mouth daily as needed. Acid reflux    . oxyCODONE (ROXICODONE) 5 MG immediate release tablet Take 1 tablet (5 mg total) by mouth every 6 (six) hours as needed. 20 tablet 0  . timolol (TIMOPTIC) 0.5 % ophthalmic solution Place 1 drop into the left eye daily.    . travoprost, benzalkonium, (TRAVATAN) 0.004 % ophthalmic solution Place 1 drop into the left eye at bedtime.     No current facility-administered medications for this visit.   Facility-Administered Medications Ordered in Other Visits  Medication Dose Route Frequency Provider Last Rate Last Dose  .  epoetin alfa (EPOGEN,PROCRIT) 29562 UNIT/ML injection           . epoetin alfa (EPOGEN,PROCRIT) injection 20,000 Units  20,000 Units Subcutaneous Q21 days Fleet Contras, MD   20,000 Units at 05/10/15 1121    REVIEW OF SYSTEMS:  [X]  denotes positive finding, [ ]  denotes negative finding Cardiac  Comments:  Chest pain or chest pressure:    Shortness of breath upon exertion:    Short of breath when lying flat:    Irregular heart rhythm:    Constitutional    Fever or chills:      PHYSICAL EXAM: There were no vitals filed for this visit.  GENERAL: The patient is a well-nourished female, in no acute distress. The vital signs are documented  above. CARDIOVASCULAR: There is a regular rate and rhythm. PULMONARY: There is good air exchange bilaterally without wheezing or rales. Her right neck incision is healing nicely. She has no focal weakness or paresthesias.  MEDICAL ISSUES:  STATUS POST RIGHT CAROTID ENDARTERECTOMY: The patient is doing well status post right carotid endarterectomy with Dacron patch angioplasty. She is on aspirin and he is on a statin. I ordered a follow up carotid duplex in 6 months and I'll see her back at that time. She knows to call sooner she has problems.  [name] [facility]: [phone]

## 2015-05-10 NOTE — Addendum Note (Signed)
Addended by: Mena Goes on: 05/10/2015 02:28 PM   Modules accepted: Orders

## 2015-05-11 DIAGNOSIS — E1121 Type 2 diabetes mellitus with diabetic nephropathy: Secondary | ICD-10-CM | POA: Diagnosis not present

## 2015-05-11 DIAGNOSIS — D81818 Other biotin-dependent carboxylase deficiency: Secondary | ICD-10-CM | POA: Diagnosis not present

## 2015-05-11 DIAGNOSIS — E119 Type 2 diabetes mellitus without complications: Secondary | ICD-10-CM | POA: Diagnosis not present

## 2015-05-11 DIAGNOSIS — E0840 Diabetes mellitus due to underlying condition with diabetic neuropathy, unspecified: Secondary | ICD-10-CM | POA: Diagnosis not present

## 2015-05-11 DIAGNOSIS — I25111 Atherosclerotic heart disease of native coronary artery with angina pectoris with documented spasm: Secondary | ICD-10-CM | POA: Diagnosis not present

## 2015-05-11 DIAGNOSIS — K219 Gastro-esophageal reflux disease without esophagitis: Secondary | ICD-10-CM | POA: Diagnosis not present

## 2015-05-11 DIAGNOSIS — E559 Vitamin D deficiency, unspecified: Secondary | ICD-10-CM | POA: Diagnosis not present

## 2015-05-11 DIAGNOSIS — Z794 Long term (current) use of insulin: Secondary | ICD-10-CM | POA: Diagnosis not present

## 2015-05-11 DIAGNOSIS — N184 Chronic kidney disease, stage 4 (severe): Secondary | ICD-10-CM | POA: Diagnosis not present

## 2015-05-11 DIAGNOSIS — E11319 Type 2 diabetes mellitus with unspecified diabetic retinopathy without macular edema: Secondary | ICD-10-CM | POA: Diagnosis not present

## 2015-05-22 DIAGNOSIS — H401131 Primary open-angle glaucoma, bilateral, mild stage: Secondary | ICD-10-CM | POA: Diagnosis not present

## 2015-05-23 ENCOUNTER — Other Ambulatory Visit (HOSPITAL_COMMUNITY): Payer: Self-pay | Admitting: *Deleted

## 2015-05-24 ENCOUNTER — Encounter (HOSPITAL_COMMUNITY)
Admission: RE | Admit: 2015-05-24 | Discharge: 2015-05-24 | Disposition: A | Discharge status: Home / Self Care | Payer: Medicare Other | Source: Ambulatory Visit | Attending: Nephrology | Admitting: Nephrology

## 2015-05-24 DIAGNOSIS — N184 Chronic kidney disease, stage 4 (severe): Secondary | ICD-10-CM | POA: Insufficient documentation

## 2015-05-24 DIAGNOSIS — D631 Anemia in chronic kidney disease: Secondary | ICD-10-CM | POA: Diagnosis not present

## 2015-05-24 LAB — POCT HEMOGLOBIN-HEMACUE: HEMOGLOBIN: 11 g/dL — AB (ref 12.0–15.0)

## 2015-05-24 MED ORDER — EPOETIN ALFA 20000 UNIT/ML IJ SOLN
INTRAMUSCULAR | Status: AC
  Filled 2015-05-24: qty 1

## 2015-05-24 MED ORDER — EPOETIN ALFA 20000 UNIT/ML IJ SOLN
20000.0000 [IU] | INTRAMUSCULAR | Status: DC
  Administered 2015-05-24: 20000 [IU] via SUBCUTANEOUS

## 2015-05-29 DIAGNOSIS — M4806 Spinal stenosis, lumbar region: Secondary | ICD-10-CM | POA: Diagnosis not present

## 2015-05-29 DIAGNOSIS — M5416 Radiculopathy, lumbar region: Secondary | ICD-10-CM | POA: Diagnosis not present

## 2015-05-31 ENCOUNTER — Encounter (HOSPITAL_COMMUNITY): Payer: Medicare Other

## 2015-06-07 ENCOUNTER — Encounter (HOSPITAL_COMMUNITY)
Admission: RE | Admit: 2015-06-07 | Discharge: 2015-06-07 | Disposition: A | Discharge status: Home / Self Care | Payer: Medicare Other | Source: Ambulatory Visit | Attending: Nephrology | Admitting: Nephrology

## 2015-06-07 DIAGNOSIS — D631 Anemia in chronic kidney disease: Secondary | ICD-10-CM | POA: Diagnosis not present

## 2015-06-07 DIAGNOSIS — N184 Chronic kidney disease, stage 4 (severe): Secondary | ICD-10-CM | POA: Diagnosis not present

## 2015-06-07 MED ORDER — EPOETIN ALFA 20000 UNIT/ML IJ SOLN
INTRAMUSCULAR | Status: AC
  Filled 2015-06-07: qty 1

## 2015-06-07 MED ORDER — EPOETIN ALFA 20000 UNIT/ML IJ SOLN
20000.0000 [IU] | INTRAMUSCULAR | Status: DC
  Administered 2015-06-07: 20000 [IU] via SUBCUTANEOUS

## 2015-06-09 LAB — POCT HEMOGLOBIN-HEMACUE: HEMOGLOBIN: 11 g/dL — AB (ref 12.0–15.0)

## 2015-06-16 ENCOUNTER — Other Ambulatory Visit: Payer: Self-pay | Admitting: Interventional Cardiology

## 2015-06-20 ENCOUNTER — Ambulatory Visit: Payer: Medicare Other | Admitting: Interventional Cardiology

## 2015-06-21 ENCOUNTER — Encounter (HOSPITAL_COMMUNITY)
Admission: RE | Admit: 2015-06-21 | Discharge: 2015-06-21 | Disposition: A | Discharge status: Home / Self Care | Payer: Medicare Other | Source: Ambulatory Visit | Attending: Nephrology | Admitting: Nephrology

## 2015-06-21 DIAGNOSIS — D631 Anemia in chronic kidney disease: Secondary | ICD-10-CM | POA: Diagnosis not present

## 2015-06-21 DIAGNOSIS — N184 Chronic kidney disease, stage 4 (severe): Secondary | ICD-10-CM | POA: Insufficient documentation

## 2015-06-21 LAB — POCT HEMOGLOBIN-HEMACUE: Hemoglobin: 11.2 g/dL — ABNORMAL LOW (ref 12.0–15.0)

## 2015-06-21 MED ORDER — EPOETIN ALFA 20000 UNIT/ML IJ SOLN
20000.0000 [IU] | INTRAMUSCULAR | Status: DC
  Administered 2015-06-21: 20000 [IU] via SUBCUTANEOUS

## 2015-06-21 MED ORDER — EPOETIN ALFA 20000 UNIT/ML IJ SOLN
INTRAMUSCULAR | Status: AC
  Filled 2015-06-21: qty 1

## 2015-06-26 DIAGNOSIS — L570 Actinic keratosis: Secondary | ICD-10-CM | POA: Diagnosis not present

## 2015-07-05 ENCOUNTER — Encounter (HOSPITAL_COMMUNITY)
Admission: RE | Admit: 2015-07-05 | Discharge: 2015-07-05 | Disposition: A | Discharge status: Home / Self Care | Payer: Medicare Other | Source: Ambulatory Visit | Attending: Nephrology | Admitting: Nephrology

## 2015-07-05 DIAGNOSIS — N184 Chronic kidney disease, stage 4 (severe): Secondary | ICD-10-CM | POA: Diagnosis not present

## 2015-07-05 DIAGNOSIS — D631 Anemia in chronic kidney disease: Secondary | ICD-10-CM | POA: Diagnosis not present

## 2015-07-05 LAB — POCT HEMOGLOBIN-HEMACUE: HEMOGLOBIN: 11.9 g/dL — AB (ref 12.0–15.0)

## 2015-07-05 MED ORDER — EPOETIN ALFA 20000 UNIT/ML IJ SOLN
20000.0000 [IU] | INTRAMUSCULAR | Status: DC
  Administered 2015-07-05: 20000 [IU] via SUBCUTANEOUS

## 2015-07-05 MED ORDER — EPOETIN ALFA 20000 UNIT/ML IJ SOLN
INTRAMUSCULAR | Status: DC
Start: 2015-07-05 — End: 2015-07-06
  Filled 2015-07-05: qty 1

## 2015-07-13 ENCOUNTER — Inpatient Hospital Stay (HOSPITAL_COMMUNITY)
Admission: EM | Admit: 2015-07-13 | Discharge: 2015-07-17 | DRG: 071 | Disposition: A | Discharge status: Home Health | Payer: Medicare Other | Attending: Internal Medicine | Admitting: Internal Medicine

## 2015-07-13 ENCOUNTER — Encounter (HOSPITAL_COMMUNITY): Payer: Self-pay | Admitting: Emergency Medicine

## 2015-07-13 ENCOUNTER — Inpatient Hospital Stay (HOSPITAL_COMMUNITY): Payer: Medicare Other

## 2015-07-13 ENCOUNTER — Emergency Department (HOSPITAL_COMMUNITY): Payer: Medicare Other

## 2015-07-13 DIAGNOSIS — F329 Major depressive disorder, single episode, unspecified: Secondary | ICD-10-CM | POA: Diagnosis present

## 2015-07-13 DIAGNOSIS — K2100 Gastro-esophageal reflux disease with esophagitis, without bleeding: Secondary | ICD-10-CM | POA: Insufficient documentation

## 2015-07-13 DIAGNOSIS — Z85820 Personal history of malignant melanoma of skin: Secondary | ICD-10-CM | POA: Diagnosis not present

## 2015-07-13 DIAGNOSIS — Z6831 Body mass index (BMI) 31.0-31.9, adult: Secondary | ICD-10-CM

## 2015-07-13 DIAGNOSIS — E119 Type 2 diabetes mellitus without complications: Secondary | ICD-10-CM

## 2015-07-13 DIAGNOSIS — Z951 Presence of aortocoronary bypass graft: Secondary | ICD-10-CM

## 2015-07-13 DIAGNOSIS — Z8711 Personal history of peptic ulcer disease: Secondary | ICD-10-CM

## 2015-07-13 DIAGNOSIS — E1122 Type 2 diabetes mellitus with diabetic chronic kidney disease: Secondary | ICD-10-CM | POA: Diagnosis present

## 2015-07-13 DIAGNOSIS — I4510 Unspecified right bundle-branch block: Secondary | ICD-10-CM | POA: Diagnosis present

## 2015-07-13 DIAGNOSIS — I639 Cerebral infarction, unspecified: Secondary | ICD-10-CM | POA: Diagnosis not present

## 2015-07-13 DIAGNOSIS — I120 Hypertensive chronic kidney disease with stage 5 chronic kidney disease or end stage renal disease: Secondary | ICD-10-CM | POA: Diagnosis present

## 2015-07-13 DIAGNOSIS — E669 Obesity, unspecified: Secondary | ICD-10-CM | POA: Diagnosis present

## 2015-07-13 DIAGNOSIS — G9340 Encephalopathy, unspecified: Secondary | ICD-10-CM | POA: Diagnosis not present

## 2015-07-13 DIAGNOSIS — Z794 Long term (current) use of insulin: Secondary | ICD-10-CM | POA: Diagnosis not present

## 2015-07-13 DIAGNOSIS — M25511 Pain in right shoulder: Secondary | ICD-10-CM | POA: Diagnosis present

## 2015-07-13 DIAGNOSIS — I2510 Atherosclerotic heart disease of native coronary artery without angina pectoris: Secondary | ICD-10-CM | POA: Diagnosis not present

## 2015-07-13 DIAGNOSIS — M79601 Pain in right arm: Secondary | ICD-10-CM | POA: Diagnosis not present

## 2015-07-13 DIAGNOSIS — Z049 Encounter for examination and observation for unspecified reason: Secondary | ICD-10-CM | POA: Diagnosis not present

## 2015-07-13 DIAGNOSIS — Z8673 Personal history of transient ischemic attack (TIA), and cerebral infarction without residual deficits: Secondary | ICD-10-CM | POA: Diagnosis not present

## 2015-07-13 DIAGNOSIS — M109 Gout, unspecified: Secondary | ICD-10-CM | POA: Diagnosis present

## 2015-07-13 DIAGNOSIS — H409 Unspecified glaucoma: Secondary | ICD-10-CM | POA: Diagnosis present

## 2015-07-13 DIAGNOSIS — I10 Essential (primary) hypertension: Secondary | ICD-10-CM | POA: Diagnosis present

## 2015-07-13 DIAGNOSIS — R4702 Dysphasia: Secondary | ICD-10-CM | POA: Diagnosis present

## 2015-07-13 DIAGNOSIS — R079 Chest pain, unspecified: Secondary | ICD-10-CM | POA: Diagnosis not present

## 2015-07-13 DIAGNOSIS — Z9181 History of falling: Secondary | ICD-10-CM

## 2015-07-13 DIAGNOSIS — R4789 Other speech disturbances: Secondary | ICD-10-CM | POA: Diagnosis not present

## 2015-07-13 DIAGNOSIS — Z7982 Long term (current) use of aspirin: Secondary | ICD-10-CM

## 2015-07-13 DIAGNOSIS — R2990 Unspecified symptoms and signs involving the nervous system: Secondary | ICD-10-CM

## 2015-07-13 DIAGNOSIS — R4182 Altered mental status, unspecified: Secondary | ICD-10-CM | POA: Diagnosis not present

## 2015-07-13 DIAGNOSIS — G459 Transient cerebral ischemic attack, unspecified: Secondary | ICD-10-CM

## 2015-07-13 DIAGNOSIS — E1139 Type 2 diabetes mellitus with other diabetic ophthalmic complication: Secondary | ICD-10-CM | POA: Diagnosis present

## 2015-07-13 DIAGNOSIS — I480 Paroxysmal atrial fibrillation: Secondary | ICD-10-CM | POA: Diagnosis present

## 2015-07-13 DIAGNOSIS — R200 Anesthesia of skin: Secondary | ICD-10-CM | POA: Diagnosis present

## 2015-07-13 DIAGNOSIS — R4781 Slurred speech: Secondary | ICD-10-CM | POA: Diagnosis not present

## 2015-07-13 DIAGNOSIS — H9190 Unspecified hearing loss, unspecified ear: Secondary | ICD-10-CM | POA: Diagnosis present

## 2015-07-13 DIAGNOSIS — Z8701 Personal history of pneumonia (recurrent): Secondary | ICD-10-CM | POA: Diagnosis not present

## 2015-07-13 DIAGNOSIS — M6281 Muscle weakness (generalized): Secondary | ICD-10-CM | POA: Diagnosis not present

## 2015-07-13 DIAGNOSIS — E785 Hyperlipidemia, unspecified: Secondary | ICD-10-CM | POA: Diagnosis present

## 2015-07-13 DIAGNOSIS — K219 Gastro-esophageal reflux disease without esophagitis: Secondary | ICD-10-CM | POA: Diagnosis present

## 2015-07-13 DIAGNOSIS — E1169 Type 2 diabetes mellitus with other specified complication: Secondary | ICD-10-CM | POA: Diagnosis present

## 2015-07-13 DIAGNOSIS — F32A Depression, unspecified: Secondary | ICD-10-CM | POA: Diagnosis present

## 2015-07-13 DIAGNOSIS — R410 Disorientation, unspecified: Secondary | ICD-10-CM | POA: Diagnosis not present

## 2015-07-13 DIAGNOSIS — I6523 Occlusion and stenosis of bilateral carotid arteries: Secondary | ICD-10-CM | POA: Diagnosis present

## 2015-07-13 DIAGNOSIS — N185 Chronic kidney disease, stage 5: Secondary | ICD-10-CM | POA: Diagnosis not present

## 2015-07-13 DIAGNOSIS — I6529 Occlusion and stenosis of unspecified carotid artery: Secondary | ICD-10-CM | POA: Diagnosis present

## 2015-07-13 DIAGNOSIS — R531 Weakness: Secondary | ICD-10-CM | POA: Diagnosis present

## 2015-07-13 DIAGNOSIS — I6789 Other cerebrovascular disease: Secondary | ICD-10-CM | POA: Diagnosis not present

## 2015-07-13 DIAGNOSIS — I4891 Unspecified atrial fibrillation: Secondary | ICD-10-CM | POA: Diagnosis present

## 2015-07-13 DIAGNOSIS — F05 Delirium due to known physiological condition: Secondary | ICD-10-CM | POA: Diagnosis not present

## 2015-07-13 LAB — COMPREHENSIVE METABOLIC PANEL
ALBUMIN: 3.5 g/dL (ref 3.5–5.0)
ALT: 13 U/L — ABNORMAL LOW (ref 14–54)
ANION GAP: 14 (ref 5–15)
AST: 19 U/L (ref 15–41)
Alkaline Phosphatase: 75 U/L (ref 38–126)
BUN: 79 mg/dL — ABNORMAL HIGH (ref 6–20)
CO2: 26 mmol/L (ref 22–32)
Calcium: 9.1 mg/dL (ref 8.9–10.3)
Chloride: 102 mmol/L (ref 101–111)
Creatinine, Ser: 2.41 mg/dL — ABNORMAL HIGH (ref 0.44–1.00)
GFR calc Af Amer: 21 mL/min — ABNORMAL LOW (ref >60)
GFR calc non Af Amer: 19 mL/min — ABNORMAL LOW (ref >60)
GLUCOSE: 220 mg/dL — AB (ref 65–99)
POTASSIUM: 3.8 mmol/L (ref 3.5–5.1)
SODIUM: 142 mmol/L (ref 135–145)
Total Bilirubin: 0.6 mg/dL (ref 0.3–1.2)
Total Protein: 7.1 g/dL (ref 6.5–8.1)

## 2015-07-13 LAB — I-STAT CHEM 8, ED
BUN: 80 mg/dL — AB (ref 6–20)
CHLORIDE: 101 mmol/L (ref 101–111)
CREATININE: 2.4 mg/dL — AB (ref 0.44–1.00)
Calcium, Ion: 1.08 mmol/L — ABNORMAL LOW (ref 1.13–1.30)
Glucose, Bld: 218 mg/dL — ABNORMAL HIGH (ref 65–99)
HEMATOCRIT: 40 % (ref 36.0–46.0)
Hemoglobin: 13.6 g/dL (ref 12.0–15.0)
POTASSIUM: 3.8 mmol/L (ref 3.5–5.1)
SODIUM: 141 mmol/L (ref 135–145)
TCO2: 29 mmol/L (ref 0–100)

## 2015-07-13 LAB — PROTIME-INR
INR: 1.01 (ref 0.00–1.49)
PROTHROMBIN TIME: 13.5 s (ref 11.6–15.2)

## 2015-07-13 LAB — CBC
HCT: 37.7 % (ref 36.0–46.0)
Hemoglobin: 12.6 g/dL (ref 12.0–15.0)
MCH: 30.8 pg (ref 26.0–34.0)
MCHC: 33.4 g/dL (ref 30.0–36.0)
MCV: 92.2 fL (ref 78.0–100.0)
PLATELETS: 178 10*3/uL (ref 150–400)
RBC: 4.09 MIL/uL (ref 3.87–5.11)
RDW: 15 % (ref 11.5–15.5)
WBC: 11.3 10*3/uL — ABNORMAL HIGH (ref 4.0–10.5)

## 2015-07-13 LAB — RAPID URINE DRUG SCREEN, HOSP PERFORMED
AMPHETAMINES: NOT DETECTED
BENZODIAZEPINES: NOT DETECTED
Barbiturates: NOT DETECTED
COCAINE: NOT DETECTED
OPIATES: NOT DETECTED
TETRAHYDROCANNABINOL: NOT DETECTED

## 2015-07-13 LAB — URINE MICROSCOPIC-ADD ON: Bacteria, UA: NONE SEEN

## 2015-07-13 LAB — DIFFERENTIAL
BASOS ABS: 0 10*3/uL (ref 0.0–0.1)
Basophils Relative: 0 %
EOS ABS: 0.2 10*3/uL (ref 0.0–0.7)
EOS PCT: 1 %
LYMPHS PCT: 21 %
Lymphs Abs: 2.4 10*3/uL (ref 0.7–4.0)
Monocytes Absolute: 0.7 10*3/uL (ref 0.1–1.0)
Monocytes Relative: 6 %
NEUTROS PCT: 72 %
Neutro Abs: 8.1 10*3/uL — ABNORMAL HIGH (ref 1.7–7.7)

## 2015-07-13 LAB — URINALYSIS, ROUTINE W REFLEX MICROSCOPIC
BILIRUBIN URINE: NEGATIVE
GLUCOSE, UA: NEGATIVE mg/dL
KETONES UR: NEGATIVE mg/dL
Leukocytes, UA: NEGATIVE
Nitrite: NEGATIVE
PH: 5 (ref 5.0–8.0)
Protein, ur: 100 mg/dL — AB
Specific Gravity, Urine: 1.014 (ref 1.005–1.030)

## 2015-07-13 LAB — GLUCOSE, CAPILLARY: GLUCOSE-CAPILLARY: 176 mg/dL — AB (ref 65–99)

## 2015-07-13 LAB — I-STAT TROPONIN, ED: Troponin i, poc: 0.02 ng/mL (ref 0.00–0.08)

## 2015-07-13 LAB — CBG MONITORING, ED: Glucose-Capillary: 188 mg/dL — ABNORMAL HIGH (ref 65–99)

## 2015-07-13 LAB — APTT: APTT: 28 s (ref 24–37)

## 2015-07-13 LAB — ETHANOL: Alcohol, Ethyl (B): <5 mg/dL (ref <5)

## 2015-07-13 MED ORDER — TRAVOPROST (BAK FREE) 0.004 % OP SOLN
1.0000 [drp] | Freq: Every day | OPHTHALMIC | Status: DC
  Administered 2015-07-13 – 2015-07-16 (×4): 1 [drp] via OPHTHALMIC
  Filled 2015-07-13: qty 2.5

## 2015-07-13 MED ORDER — COLCHICINE 0.6 MG PO TABS
0.6000 mg | ORAL_TABLET | Freq: Two times a day (BID) | ORAL | Status: DC
  Administered 2015-07-13 – 2015-07-17 (×8): 0.6 mg via ORAL
  Filled 2015-07-13 (×8): qty 1

## 2015-07-13 MED ORDER — SENNOSIDES-DOCUSATE SODIUM 8.6-50 MG PO TABS: 1.0000 | ORAL_TABLET | Freq: Every evening | ORAL | Status: DC | PRN

## 2015-07-13 MED ORDER — PANTOPRAZOLE SODIUM 40 MG PO TBEC
40.0000 mg | DELAYED_RELEASE_TABLET | Freq: Every day | ORAL | Status: DC
  Administered 2015-07-13 – 2015-07-17 (×4): 40 mg via ORAL
  Filled 2015-07-13 (×5): qty 1

## 2015-07-13 MED ORDER — HYDROCODONE-ACETAMINOPHEN 5-325 MG PO TABS
1.0000 | ORAL_TABLET | ORAL | Status: DC | PRN
  Administered 2015-07-13 – 2015-07-14 (×2): 1 via ORAL
  Filled 2015-07-13 (×2): qty 1

## 2015-07-13 MED ORDER — VITAMIN D 1000 UNITS PO TABS
2000.0000 [IU] | ORAL_TABLET | Freq: Two times a day (BID) | ORAL | Status: DC
  Administered 2015-07-13 – 2015-07-17 (×8): 2000 [IU] via ORAL
  Filled 2015-07-13 (×8): qty 2

## 2015-07-13 MED ORDER — LOSARTAN POTASSIUM 50 MG PO TABS: 100.0000 mg | ORAL_TABLET | Freq: Every day | ORAL | Status: DC

## 2015-07-13 MED ORDER — CITALOPRAM HYDROBROMIDE 10 MG PO TABS
20.0000 mg | ORAL_TABLET | Freq: Every day | ORAL | Status: DC
  Administered 2015-07-13 – 2015-07-17 (×5): 20 mg via ORAL
  Filled 2015-07-13 (×5): qty 2

## 2015-07-13 MED ORDER — ATORVASTATIN CALCIUM 10 MG PO TABS
10.0000 mg | ORAL_TABLET | Freq: Every day | ORAL | Status: DC
  Administered 2015-07-14 – 2015-07-17 (×4): 10 mg via ORAL
  Filled 2015-07-13 (×4): qty 1

## 2015-07-13 MED ORDER — CALCITRIOL 0.25 MCG PO CAPS
0.2500 ug | ORAL_CAPSULE | ORAL | Status: DC
  Administered 2015-07-15 – 2015-07-17 (×2): 0.25 ug via ORAL
  Filled 2015-07-13 (×2): qty 1

## 2015-07-13 MED ORDER — FUROSEMIDE 20 MG PO TABS: 80.0000 mg | ORAL_TABLET | Freq: Two times a day (BID) | ORAL | Status: DC

## 2015-07-13 MED ORDER — B COMPLEX-C PO TABS
1.0000 | ORAL_TABLET | Freq: Every day | ORAL | Status: DC
  Administered 2015-07-14 – 2015-07-17 (×4): 1 via ORAL
  Filled 2015-07-13 (×4): qty 1

## 2015-07-13 MED ORDER — STROKE: EARLY STAGES OF RECOVERY BOOK
Freq: Once | Status: AC
  Administered 2015-07-13: 23:00:00
  Filled 2015-07-13: qty 1

## 2015-07-13 MED ORDER — ACETAMINOPHEN 500 MG PO TABS: 500.0000 mg | ORAL_TABLET | Freq: Four times a day (QID) | ORAL | Status: DC | PRN

## 2015-07-13 MED ORDER — OMEGA-3-ACID ETHYL ESTERS 1 G PO CAPS
2.0000 g | ORAL_CAPSULE | Freq: Every day | ORAL | Status: DC
  Administered 2015-07-14 – 2015-07-17 (×4): 2 g via ORAL
  Filled 2015-07-13 (×4): qty 2

## 2015-07-13 MED ORDER — BRIMONIDINE TARTRATE 0.2 % OP SOLN
1.0000 [drp] | Freq: Two times a day (BID) | OPHTHALMIC | Status: DC
  Administered 2015-07-13 – 2015-07-17 (×8): 1 [drp] via OPHTHALMIC
  Filled 2015-07-13: qty 5

## 2015-07-13 MED ORDER — INSULIN GLARGINE 100 UNIT/ML ~~LOC~~ SOLN
20.0000 [IU] | Freq: Every day | SUBCUTANEOUS | Status: DC
  Administered 2015-07-13 – 2015-07-16 (×4): 20 [IU] via SUBCUTANEOUS
  Filled 2015-07-13 (×5): qty 0.2

## 2015-07-13 MED ORDER — ALLOPURINOL 100 MG PO TABS
200.0000 mg | ORAL_TABLET | Freq: Every day | ORAL | Status: DC
  Administered 2015-07-13 – 2015-07-17 (×5): 200 mg via ORAL
  Filled 2015-07-13 (×6): qty 2

## 2015-07-13 MED ORDER — ASPIRIN 325 MG PO TABS
325.0000 mg | ORAL_TABLET | Freq: Every day | ORAL | Status: DC
  Administered 2015-07-13 – 2015-07-17 (×5): 325 mg via ORAL
  Filled 2015-07-13 (×5): qty 1

## 2015-07-13 MED ORDER — METOPROLOL TARTRATE 50 MG PO TABS
50.0000 mg | ORAL_TABLET | Freq: Two times a day (BID) | ORAL | Status: DC
  Administered 2015-07-13 – 2015-07-17 (×8): 50 mg via ORAL
  Filled 2015-07-13 (×8): qty 1

## 2015-07-13 MED ORDER — TIMOLOL MALEATE 0.5 % OP SOLN
1.0000 [drp] | Freq: Every day | OPHTHALMIC | Status: DC
  Administered 2015-07-14 – 2015-07-17 (×5): 1 [drp] via OPHTHALMIC
  Filled 2015-07-13: qty 5

## 2015-07-13 MED ORDER — INSULIN ASPART 100 UNIT/ML ~~LOC~~ SOLN
0.0000 [IU] | Freq: Three times a day (TID) | SUBCUTANEOUS | Status: DC
  Administered 2015-07-14: 2 [IU] via SUBCUTANEOUS
  Administered 2015-07-15: 3 [IU] via SUBCUTANEOUS
  Administered 2015-07-15 (×2): 2 [IU] via SUBCUTANEOUS
  Administered 2015-07-16 (×2): 3 [IU] via SUBCUTANEOUS
  Administered 2015-07-17: 2 [IU] via SUBCUTANEOUS

## 2015-07-13 MED ORDER — HEPARIN SODIUM (PORCINE) 5000 UNIT/ML IJ SOLN
5000.0000 [IU] | Freq: Three times a day (TID) | INTRAMUSCULAR | Status: DC
  Administered 2015-07-13 – 2015-07-17 (×11): 5000 [IU] via SUBCUTANEOUS
  Filled 2015-07-13 (×11): qty 1

## 2015-07-13 MED ORDER — AMLODIPINE BESYLATE 10 MG PO TABS
10.0000 mg | ORAL_TABLET | Freq: Every day | ORAL | Status: DC
  Administered 2015-07-13: 10 mg via ORAL
  Filled 2015-07-13: qty 1

## 2015-07-13 NOTE — ED Provider Notes (Signed)
CSN: [id number]     Arrival date & time 07/13/15  1626 History   First MD Initiated Contact with Patient 07/13/15 1629     Chief Complaint  Patient presents with  . Code Stroke    @EDPCLEARED @ (Consider location/radiation/quality/duration/timing/severity/associated sxs/prior Treatment) HPI   Patient is a 74 year old female with history of dementia, hard of hearing, anemia, hypertension, A. fib, CAD presenting today with strokelike symptoms. Patient was acting sightly confused this morning. Around lunchtime she was confabulating and not able to make clear sentences. She is having trouble understanding. Brought here to emergency department with family. Family provided the story.  Family denies any focal weakness. Just confusion and difficulty understanding of dressing herself. Patient does have right arm weakness secondary to pain. Patient reportedly fell multiple months ago and never got it checked out. Patient does have fistula on the left arm they're not actively doing dialysis but in preparation for dialysis.  Patient unable to give history secondary to mental status change..  Past Medical History  Diagnosis Date  . Anemia   . Glaucoma   . Hypertension   . Blood transfusion   . GERD (gastroesophageal reflux disease)   . Arthritis   . Neuromuscular disorder ([place])     CARPEL TUNNEL  . Atrial fibrillation ([place])   . CAD (coronary artery disease)   . Carotid artery occlusion     Carotid Endartectom,y - left 2009.  Blockage Right being watched by Dr Scot Dock.  Marland Kitchen HOH (hard of hearing)   . Shortness of breath   . Depression   . History of kidney stones     passed  . Carotid stenosis   . Complication of anesthesia     pt. states that she was difficult to wake  . Stroke ([place])     hx of TIA  . 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 Cancer ([place])     .  top of head- melonoma  . Dysrhythmia   . History of pneumonia   . Diabetes mellitus without complication [facility])     Past Surgical History  Procedure Laterality Date  . Carpel tunnel    . Neck surgery    . Esophagogastroduodenoscopy  07/25/2011    Procedure: ESOPHAGOGASTRODUODENOSCOPY (EGD);  Surgeon: Winfield Cunas., MD;  [facility];  Service: Endoscopy;  Laterality: N/A;  . Colonoscopy  07/25/2011    Procedure: COLONOSCOPY;  Surgeon: Winfield Cunas., MD;  Location: [facility] ENDOSCOPY;  Service: Endoscopy;  Laterality: N/A;  . Coronary artery bypass graft  07/29/2011    Procedure: CORONARY ARTERY BYPASS GRAFTING (CABG);  Surgeon: Gaye Pollack, MD;  Location: [place];  Service: Open Heart Surgery;  Laterality: N/A;  . Carotid endarterectomy Left 2009     CEA  . Av fistula placement Left 05/31/2014    Procedure: Creation of Left Arm arteriovenous brachiocephalic Fistula;  Surgeon: Angelia Mould, MD;  Location: [place];  Service: Vascular;  Laterality: Left;  . Fistulogram Left 08/29/2014    Procedure: FISTULOGRAM;  Surgeon: Angelia Mould, MD;  Location[facility] CATH LAB;  Service: Cardiovascular;  Laterality: Left;  . Av fistula placement Left 09/01/2014    Procedure: INSERTION OF ARTERIOVENOUS (AV) GORE-TEX GRAFT LEFT UPPER ARM USING  4-7 MM X 45 CM SRTETCH GORETEX GRAFT;  Surgeon: Angelia Mould, MD;  Location: [place];  Service: Vascular;  Laterality: Left;  . Cardiac catheterization    . Tonsillectomy    .  Eye surgery    . Endarterectomy Right 04/21/2015    Procedure: ENDARTERECTOMY CAROTID;  Surgeon: Angelia Mould, MD;  Location: [facility] OR;  Service: Vascular;  Laterality: Right;   Family History  Problem Relation Age of Onset  . Diabetes Mother   . Hypertension Mother   . Heart disease Mother     beofre age 78  . Heart attack Mother   . Cancer Father   . Hyperlipidemia Father   . Hypertension Father   . Deep vein thrombosis Daughter   . Diabetes Daughter   . Hyperlipidemia Daughter   . Hypertension Daughter   . Heart disease Daughter   . Peripheral vascular  disease Daughter    Social History  Substance Use Topics  . Smoking status: Never Smoker   . Smokeless tobacco: Never Used  . Alcohol Use: No   OB History    No data available     Review of Systems  Unable to perform ROS: Mental status change      Allergies  Gabapentin  Home Medications   Prior to Admission medications   Medication Sig Start Date End Date Taking? Authorizing Provider  allopurinol (ZYLOPRIM) 100 MG tablet Take 200 mg by mouth daily.    Yes Historical Provider, MD  amLODipine (NORVASC) 10 MG tablet TAKE ONE TABLET BY MOUTH ONE  TIME DAILY 06/16/15  Yes Jettie Booze, MD  aspirin 325 MG tablet Take 325 mg by mouth daily.   Yes Historical Provider, MD  atorvastatin (LIPITOR) 10 MG tablet TAKE ONE TABLET BY MOUTH ONE TIME DAILY Patient taking differently: Take 10 mg by mouth daily.  02/21/14  Yes Jettie Booze, MD  B Complex Vitamins (VITAMIN B COMPLEX PO) Take 1 tablet by mouth daily.   Yes Historical Provider, MD  brimonidine (ALPHAGAN P) 0.1 % SOLN Place 1 drop into the left eye 2 (two) times daily.   Yes Historical Provider, MD  calcitRIOL (ROCALTROL) 0.25 MCG capsule Take 0.25 mcg by mouth every other day.   Yes Historical Provider, MD  Cholecalciferol (VITAMIN D) 2000 UNITS tablet Take 2,000 Units by mouth 2 (two) times daily.   Yes Historical Provider, MD  citalopram (CELEXA) 20 MG tablet Take 20 mg by mouth daily.   Yes Historical Provider, MD  fish oil-omega-3 fatty acids 1000 MG capsule Take 2 g by mouth daily.   Yes Historical Provider, MD  furosemide (LASIX) 40 MG tablet Take 80 mg by mouth 2 (two) times daily.  12/01/11  Yes Historical Provider, MD  HUMALOG 100 UNIT/ML injection Inject 0-12 Units into the skin 3 (three) times daily with meals. Sliding scale 04/17/14  Yes Historical Provider, MD  HYDROcodone-acetaminophen (NORCO/VICODIN) 5-325 MG tablet Take 1-2 tablets by mouth every 4 (four) hours as needed for moderate pain. 04/21/15  Yes  Samantha J Rhyne, PA-C  insulin glargine (LANTUS) 100 UNIT/ML injection Inject 20-30 Units into the skin at bedtime.    Yes Historical Provider, MD  linagliptin (TRADJENTA) 5 MG TABS tablet Take 1 tablet (5 mg total) by mouth daily with breakfast. 08/02/11  Yes Donielle Liston Alba, PA-C  losartan (COZAAR) 100 MG tablet Take 1 tablet by mouth daily. 05/19/15  Yes Historical Provider, MD  metoprolol (LOPRESSOR) 50 MG tablet Take 50 mg by mouth 2 (two) times daily.   Yes Historical Provider, MD  omeprazole (PRILOSEC) 20 MG capsule Take 20 mg by mouth daily as needed. Acid reflux   Yes Historical Provider, MD  timolol (TIMOPTIC) 0.5 %  ophthalmic solution Place 1 drop into the left eye daily.   Yes Historical Provider, MD  travoprost, benzalkonium, (TRAVATAN) 0.004 % ophthalmic solution Place 1 drop into the left eye at bedtime.   Yes Historical Provider, MD  acetaminophen (TYLENOL) 500 MG tablet Take 500 mg by mouth every 6 (six) hours as needed. For pain    Historical Provider, MD  colchicine 0.6 MG tablet Take 0.6 mg by mouth 2 (two) times daily. gout    Historical Provider, MD  oxyCODONE (ROXICODONE) 5 MG immediate release tablet Take 1 tablet (5 mg total) by mouth every 6 (six) hours as needed. 09/01/14   Samantha J Rhyne, PA-C   BP 164/67 mmHg  Pulse 82  Temp(Src) 98.9 F (37.2 C) (Oral)  Resp 20  Ht 5\' 5"  (1.651 m)  Wt 186 lb 8 oz (84.596 kg)  BMI 31.04 kg/m2  SpO2 94% Physical Exam  Constitutional: She appears well-developed and well-nourished.  Patient hard of hearing.  HENT:  Head: Normocephalic and atraumatic.  Eyes: Right eye exhibits no discharge.  Cardiovascular: Normal rate and regular rhythm.   No murmur heard. Pulmonary/Chest: Effort normal and breath sounds normal. She has no wheezes. She has no rales.  Abdominal: Soft. She exhibits no distension. There is no tenderness.  Neurological:  Patient alert a to self and place.  Patient unable to lift right arm. No pain or  different left. Equal strength in squeeze bilaterally. No cranial nerve deficit noticed, patient unable to clearly communicate.  Skin: Skin is warm and dry. She is not diaphoretic.  Psychiatric:  Unable to assess   Nursing note and vitals reviewed.   ED Course  Procedures (including critical care time) Labs Review Labs Reviewed  CBC - Abnormal; Notable for the following:    WBC 11.3 (*)    All other components within normal limits  DIFFERENTIAL - Abnormal; Notable for the following:    Neutro Abs 8.1 (*)    All other components within normal limits  COMPREHENSIVE METABOLIC PANEL - Abnormal; Notable for the following:    Glucose, Bld 220 (*)    BUN 79 (*)    Creatinine, Ser 2.41 (*)    ALT 13 (*)    GFR calc non Af Amer 19 (*)    GFR calc Af Amer 21 (*)    All other components within normal limits  URINALYSIS, ROUTINE W REFLEX MICROSCOPIC (NOT AT [facility]) - Abnormal; Notable for the following:    Hgb urine dipstick SMALL (*)    Protein, ur 100 (*)    All other components within normal limits  URINE MICROSCOPIC-ADD ON - Abnormal; Notable for the following:    Squamous Epithelial / LPF 0-5 (*)    All other components within normal limits  GLUCOSE, CAPILLARY - Abnormal; Notable for the following:    Glucose-Capillary 176 (*)    All other components within normal limits  I-STAT CHEM 8, ED - Abnormal; Notable for the following:    BUN 80 (*)    Creatinine, Ser 2.40 (*)    Glucose, Bld 218 (*)    Calcium, Ion 1.08 (*)    All other components within normal limits  CBG MONITORING, ED - Abnormal; Notable for the following:    Glucose-Capillary 188 (*)    All other components within normal limits  URINE CULTURE  ETHANOL  PROTIME-INR  APTT  URINE RAPID DRUG SCREEN, HOSP PERFORMED  HEMOGLOBIN A1C  LIPID PANEL  I-STAT TROPOININ, ED    Imaging Review  Dg Chest 2 View  07/13/2015  CLINICAL DATA:  Chest pain. EXAM: CHEST  2 VIEW COMPARISON:  05/31/2014 chest radiograph. FINDINGS:  Median sternotomy wires are aligned and intact. Stable cardiomediastinal silhouette with mild cardiomegaly. No pneumothorax. No pleural effusion. Clear lungs, with no focal lung consolidation and no pulmonary edema. IMPRESSION: Stable mild cardiomegaly without overt pulmonary edema. No active pulmonary disease. Electronically Signed   By: Ilona Sorrel M.D.   On: 07/13/2015 18:59   Dg Shoulder Right  07/13/2015  CLINICAL DATA:  74 year old female with right shoulder pain.  The EXAM: RIGHT SHOULDER - 2+ VIEW COMPARISON:  None. FINDINGS: No acute fracture or dislocation. A 5 mm sclerotic lesion in the proximal aspect of the humeral diaphysis likely represents a bone island. The soft tissues appear unremarkable with IMPRESSION: Negative. Electronically Signed   By: Anner Crete M.D.   On: 07/13/2015 18:59   Ct Head Wo Contrast  07/13/2015  CLINICAL DATA:  Slurred speech and right arm weakness, initial encounter EXAM: CT HEAD WITHOUT CONTRAST TECHNIQUE: Contiguous axial images were obtained from the base of the skull through the vertex without intravenous contrast. COMPARISON:  None. FINDINGS: Bony calvarium is intact. No gross soft tissue abnormality is noted. Some mucosal thickening is noted within the right sphenoid sinus. Diffuse basal ganglia calcifications are noted. No findings to suggest acute hemorrhage, acute infarction or space-occupying mass lesion are noted. Mild atrophic changes are noted. IMPRESSION: No acute intracranial abnormality noted. Critical Value/emergent results were called by telephone at the time of interpretation on 07/13/2015 at 4:43 pm to Dr. Nicole Kindred, who verbally acknowledged these results. Electronically Signed   By: Inez Catalina M.D.   On: 07/13/2015 16:46   Mr Brain Wo Contrast  07/13/2015  CLINICAL DATA:  New onset speech changes and RIGHT-sided weakness. History of hypertension, diabetes, atrial fibrillation, carotid stenosis and carotid endarterectomy. EXAM: MRI HEAD  WITHOUT CONTRAST MRA HEAD WITHOUT CONTRAST TECHNIQUE: Multiplanar, multiecho pulse sequences of the brain and surrounding structures were obtained without intravenous contrast. Angiographic images of the head were obtained using MRA technique without contrast. COMPARISON:  CT head July 13, 2015 at 1634 hours. FINDINGS: MRI HEAD FINDINGS Multiple sequences are mildly motion degraded. The ventricles and sulci are normal for patient's age. No abnormal parenchymal signal, mass lesions, mass effect. No reduced diffusion to suggest acute ischemia. Nonspecific punctate focus of susceptibility artifact LEFT cerebellum, RIGHT body of the corpus callosum. No abnormal extra-axial fluid collections. No extra-axial masses though, contrast enhanced sequences would be more sensitive. Normal major intracranial vascular flow voids seen at the skull base. Status post bilateral ocular lens implants, orbital contents are otherwise unremarkable. No abnormal sellar expansion. No suspicious calvarial bone marrow signal. Craniocervical junction maintained. Pan paranasal sinusitis, severe RIGHT sphenoid sinusitis. The mastoid air cells are well aerated. MRA HEAD FINDINGS Moderately motion degraded examination. Anterior circulation: Flow related enhancement of the included cervical, petrous, cavernous and supraclinoid internal carotid arteries. Mild stenosis suspected bilateral supraclinoid internal carotid arteries in RIGHT cavernous carotid artery though motion degrades sensitivity. Patent anterior communicating artery. Normal flow related enhancement of the anterior and middle cerebral arteries, including distal segments. Supernumerary anterior cerebral artery arising from LEFT A1-2 junction. No large vessel occlusion, definite high-grade stenosis, abnormal luminal irregularity, aneurysm. Posterior circulation: LEFT vertebral artery is dominant. Basilar artery is patent, with normal flow related enhancement of the main branch vessels.  Normal flow related enhancement of the posterior cerebral arteries. Bilateral posterior communicating arteries present. No large vessel occlusion,  definite high-grade stenosis, abnormal luminal irregularity, aneurysm. IMPRESSION: MRI HEAD: No acute intracranial process, specifically no acute ischemia on this mildly motion degraded examination. Negative noncontrast MRI brain for age. MRA HEAD: No acute large vessel occlusion or definite high-grade stenosis on this moderately motion degraded examination. Electronically Signed   By: Elon Alas M.D.   On: 07/13/2015 22:00   Mr Jodene Nam Head/brain Wo Cm  07/13/2015  CLINICAL DATA:  New onset speech changes and RIGHT-sided weakness. History of hypertension, diabetes, atrial fibrillation, carotid stenosis and carotid endarterectomy. EXAM: MRI HEAD WITHOUT CONTRAST MRA HEAD WITHOUT CONTRAST TECHNIQUE: Multiplanar, multiecho pulse sequences of the brain and surrounding structures were obtained without intravenous contrast. Angiographic images of the head were obtained using MRA technique without contrast. COMPARISON:  CT head July 13, 2015 at 1634 hours. FINDINGS: MRI HEAD FINDINGS Multiple sequences are mildly motion degraded. The ventricles and sulci are normal for patient's age. No abnormal parenchymal signal, mass lesions, mass effect. No reduced diffusion to suggest acute ischemia. Nonspecific punctate focus of susceptibility artifact LEFT cerebellum, RIGHT body of the corpus callosum. No abnormal extra-axial fluid collections. No extra-axial masses though, contrast enhanced sequences would be more sensitive. Normal major intracranial vascular flow voids seen at the skull base. Status post bilateral ocular lens implants, orbital contents are otherwise unremarkable. No abnormal sellar expansion. No suspicious calvarial bone marrow signal. Craniocervical junction maintained. Pan paranasal sinusitis, severe RIGHT sphenoid sinusitis. The mastoid air cells are well  aerated. MRA HEAD FINDINGS Moderately motion degraded examination. Anterior circulation: Flow related enhancement of the included cervical, petrous, cavernous and supraclinoid internal carotid arteries. Mild stenosis suspected bilateral supraclinoid internal carotid arteries in RIGHT cavernous carotid artery though motion degrades sensitivity. Patent anterior communicating artery. Normal flow related enhancement of the anterior and middle cerebral arteries, including distal segments. Supernumerary anterior cerebral artery arising from LEFT A1-2 junction. No large vessel occlusion, definite high-grade stenosis, abnormal luminal irregularity, aneurysm. Posterior circulation: LEFT vertebral artery is dominant. Basilar artery is patent, with normal flow related enhancement of the main branch vessels. Normal flow related enhancement of the posterior cerebral arteries. Bilateral posterior communicating arteries present. No large vessel occlusion, definite high-grade stenosis, abnormal luminal irregularity, aneurysm. IMPRESSION: MRI HEAD: No acute intracranial process, specifically no acute ischemia on this mildly motion degraded examination. Negative noncontrast MRI brain for age. MRA HEAD: No acute large vessel occlusion or definite high-grade stenosis on this moderately motion degraded examination. Electronically Signed   By: Elon Alas M.D.   On: 07/13/2015 22:00   I have personally reviewed and evaluated these images and lab results as part of my medical decision-making.   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 fib, CAD presenting today with strokelike symptoms. It sounds if symptoms significantly none since noon. They're very vague symptoms of confusion and inability to communicate. It is difficult to assess patient given how hard of hearing she is. Neurology is already seen the patient would like to admit for TIA versus stroke  workup. The thing I will offer today is to get a chest x-ray UA to make sure that she does not have an infection causing this global confusion. In addition we will get x-ray of the right arm which is bothering her for several months.  Will admit to medicine. No other obvious source for confusion noted. ADmit for stroke work up.   Ajwa Kimberley Julio Alm, MD 07/14/15 [phone]

## 2015-07-13 NOTE — Consult Note (Signed)
Admission H&P    Chief Complaint: New onset speech changes and right-sided weakness.  HPI: [name] is an 74 y.o. female with a history of hypertension, diabetes mellitus, atrial fibrillation not on anticoagulation, coronary artery disease, bilateral carotid artery stenosis and CEA, and TIA, brought to the emergency room and code stroke status following acute onset of speech output changes. Patient reportedly was repeating herself repeating others, and at times at speech content that was nonsensical. She also had right upper extremity weakness in route to the ED. She has been taking aspirin daily. CT scan of her head showed no acute intracranial abnormality. NIH stroke score was 3. Right upper extremity was essentially resolved and drift was felt to be due to pain and guarding involving right shoulder. He tended to repeat it was said to her as well as her rate with verbal responses to questions.  LSN: [id number] on 07/13/2015 tPA Given: No: Minimal deficits mRankin:  Past Medical History  Diagnosis Date  . Anemia   . Glaucoma   . Hypertension   . Blood transfusion   . GERD (gastroesophageal reflux disease)   . Arthritis   . Neuromuscular disorder ([place])     CARPEL TUNNEL  . Atrial fibrillation ([place])   . CAD (coronary artery disease)   . Carotid artery occlusion     Carotid Endartectom,y - left 2009.  Blockage Right being watched by Dr Scot Dock.  Marland Kitchen HOH (hard of hearing)   . Shortness of breath   . Depression   . History of kidney stones     passed  . Carotid stenosis   . Complication of anesthesia     pt. states that she was difficult to wake  . Stroke ([place])     hx of TIA  . 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 Cancer ([place])     .  top of head- melonoma  . Dysrhythmia   . History of pneumonia   . Diabetes mellitus without complication [facility])     Past Surgical History  Procedure Laterality Date  . Carpel tunnel    . Neck surgery    .  Esophagogastroduodenoscopy  07/25/2011    Procedure: ESOPHAGOGASTRODUODENOSCOPY (EGD);  Surgeon: Winfield Cunas., MD;  Location[facility] ENDOSCOPY;  Service: Endoscopy;  Laterality: N/A;  . Colonoscopy  07/25/2011    Procedure: COLONOSCOPY;  Surgeon: Winfield Cunas., MD;  Location: [facility] ENDOSCOPY;  Service: Endoscopy;  Laterality: N/A;  . Coronary artery bypass graft  07/29/2011    Procedure: CORONARY ARTERY BYPASS GRAFTING (CABG);  Surgeon: Gaye Pollack, MD;  Location: [place];  Service: Open Heart Surgery;  Laterality: N/A;  . Carotid endarterectomy Left 2009     CEA  . Av fistula placement Left 05/31/2014    Procedure: Creation of Left Arm arteriovenous brachiocephalic Fistula;  Surgeon: Angelia Mould, MD;  Location: [place];  Service: Vascular;  Laterality: Left;  . Fistulogram Left 08/29/2014    Procedure: FISTULOGRAM;  Surgeon: Angelia Mould, MD;  Location: [facility] CATH LAB;  Service: Cardiovascular;  Laterality: Left;  . Av fistula placement Left 09/01/2014    Procedure: INSERTION OF ARTERIOVENOUS (AV) GORE-TEX GRAFT LEFT UPPER ARM USING  4-7 MM X 45 CM SRTETCH GORETEX GRAFT;  Surgeon: Angelia Mould, MD;  Location: [place];  Service: Vascular;  Laterality: Left;  . Cardiac catheterization    . Tonsillectomy    . Eye surgery    . Endarterectomy  Right 04/21/2015    Procedure: ENDARTERECTOMY CAROTID;  Surgeon: Angelia Mould, MD;  Location: [facility] OR;  Service: Vascular;  Laterality: Right;    Family History  Problem Relation Age of Onset  . Diabetes Mother   . Hypertension Mother   . Heart disease Mother     beofre age 50  . Heart attack Mother   . Cancer Father   . Hyperlipidemia Father   . Hypertension Father   . Deep vein thrombosis Daughter   . Diabetes Daughter   . Hyperlipidemia Daughter   . Hypertension Daughter   . Heart disease Daughter   . Peripheral vascular disease Daughter    Social History:  reports that she has never smoked. She has never used  smokeless tobacco. She reports that she does not drink alcohol or use illicit drugs.  Allergies:  Allergies  Allergen Reactions  . Gabapentin Other (See Comments)    hallucinations   Medications: Patient's preadmission medications were reviewed by me.  ROS: History obtained from patient's daughter area  General ROS: negative for - chills, fatigue, fever, night sweats, weight gain or weight loss Psychological ROS: negative for - behavioral disorder, hallucinations, memory difficulties, mood swings or suicidal ideation Ophthalmic ROS: negative for - blurry vision, double vision, eye pain or loss of vision ENT ROS: negative for - epistaxis, nasal discharge, oral lesions, sore throat, tinnitus or vertigo Allergy and Immunology ROS: negative for - hives or itchy/watery eyes Hematological and Lymphatic ROS: negative for - bleeding problems, bruising or swollen lymph nodes Endocrine ROS: negative for - galactorrhea, hair pattern changes, polydipsia/polyuria or temperature intolerance Respiratory ROS: negative for - cough, hemoptysis, shortness of breath or wheezing Cardiovascular ROS: negative for - chest pain, dyspnea on exertion, edema or irregular heartbeat Gastrointestinal ROS: negative for - abdominal pain, diarrhea, hematemesis, nausea/vomiting or stool incontinence Genito-Urinary ROS: negative for - dysuria, hematuria, incontinence or urinary frequency/urgency Musculoskeletal ROS: negative for - joint swelling or muscular weakness Neurological ROS: as noted in HPI Dermatological ROS: negative for rash and skin lesion changes  Physical Examination: Blood pressure 162/64, pulse 79, temperature 97.8 F (36.6 C), temperature source Axillary, resp. rate 16, weight 87.5 kg (192 lb 14.4 oz), SpO2 100 %.  HEENT-  Normocephalic, no lesions, without obvious abnormality.  Normal external eye and conjunctiva.  Normal TM's bilaterally.  Normal auditory canals and external ears. Normal external  nose, mucus membranes and septum.  Normal pharynx. Neck supple with no masses, nodes, nodules or enlargement. Cardiovascular - irregularly irregular rhythm, S1, S2 normal and no S3 or S4 Lungs - chest clear, no wheezing, rales, normal symmetric air entry Abdomen - soft, non-tender; bowel sounds normal; no masses,  no organomegaly Extremities - no joint deformities, effusion, or inflammation and no edema  Neurologic Examination: Mental Status: Alert, oriented, no acute distress.  Speech fluent without evidence of aphasia, but frequent perseveration with verbal responses. Able to follow commands without difficulty. Cranial Nerves: II-Visual fields were normal. III/IV/VI-Pupils were equal and reacted normally to light. Extraocular movements were full and conjugate.    V/VII-no facial numbness and no facial weakness. VIII-normal. X-normal speech. XI: trapezius strength/neck flexion strength normal bilaterally XII-midline tongue extension with normal strength. Motor: Drift of right upper extremity with complaint of pain involving right shoulder with extension of the arm; motor exam is otherwise unremarkable. Deep Tendon Reflexes: Absent throughout. Plantars: Mute bilaterally Cerebellar: Normal finger-to-nose testing. Carotid auscultation: Normal  Results for orders placed or performed during the hospital encounter of 07/13/15 (  from the past 48 hour(s))  Protime-INR     Status: None   Collection Time: 07/13/15  4:30 PM  Result Value Ref Range   Prothrombin Time 13.5 11.6 - 15.2 seconds   INR 1.01 0.00 - 1.49  APTT     Status: None   Collection Time: 07/13/15  4:30 PM  Result Value Ref Range   aPTT 28 24 - 37 seconds  CBC     Status: Abnormal   Collection Time: 07/13/15  4:30 PM  Result Value Ref Range   WBC 11.3 (H) 4.0 - 10.5 K/uL   RBC 4.09 3.87 - 5.11 MIL/uL   Hemoglobin 12.6 12.0 - 15.0 g/dL   HCT 37.7 36.0 - 46.0 %   MCV 92.2 78.0 - 100.0 fL   MCH 30.8 26.0 - 34.0 pg   MCHC  33.4 30.0 - 36.0 g/dL   RDW 15.0 11.5 - 15.5 %   Platelets 178 150 - 400 K/uL  Differential     Status: Abnormal   Collection Time: 07/13/15  4:30 PM  Result Value Ref Range   Neutrophils Relative % 72 %   Neutro Abs 8.1 (H) 1.7 - 7.7 K/uL   Lymphocytes Relative 21 %   Lymphs Abs 2.4 0.7 - 4.0 K/uL   Monocytes Relative 6 %   Monocytes Absolute 0.7 0.1 - 1.0 K/uL   Eosinophils Relative 1 %   Eosinophils Absolute 0.2 0.0 - 0.7 K/uL   Basophils Relative 0 %   Basophils Absolute 0.0 0.0 - 0.1 K/uL  Comprehensive metabolic panel     Status: Abnormal   Collection Time: 07/13/15  4:30 PM  Result Value Ref Range   Sodium 142 135 - 145 mmol/L   Potassium 3.8 3.5 - 5.1 mmol/L   Chloride 102 101 - 111 mmol/L   CO2 26 22 - 32 mmol/L   Glucose, Bld 220 (H) 65 - 99 mg/dL   BUN 79 (H) 6 - 20 mg/dL   Creatinine, Ser 2.41 (H) 0.44 - 1.00 mg/dL   Calcium 9.1 8.9 - 10.3 mg/dL   Total Protein 7.1 6.5 - 8.1 g/dL   Albumin 3.5 3.5 - 5.0 g/dL   AST 19 15 - 41 U/L   ALT 13 (L) 14 - 54 U/L   Alkaline Phosphatase 75 38 - 126 U/L   Total Bilirubin 0.6 0.3 - 1.2 mg/dL   GFR calc non Af Amer 19 (L) >60 mL/min   GFR calc Af Amer 21 (L) >60 mL/min    Comment: (NOTE) The eGFR has been calculated using the CKD EPI equation. This calculation has not been validated in all clinical situations. eGFR's persistently <60 mL/min signify possible Chronic Kidney Disease.    Anion gap 14 5 - 15  I-stat troponin, ED (not at [facility], [facility])     Status: None   Collection Time: 07/13/15  4:43 PM  Result Value Ref Range   Troponin i, poc 0.02 0.00 - 0.08 ng/mL   Comment 3            Comment: Due to the release kinetics of cTnI, a negative result within the first hours of the onset of symptoms does not rule out myocardial infarction with certainty. If myocardial infarction is still suspected, repeat the test at appropriate intervals.   I-Stat Chem 8, ED  (not at [facility], [facility])     Status: Abnormal   Collection Time:  07/13/15  4:45 PM  Result Value Ref Range   Sodium 141 135 -  145 mmol/L   Potassium 3.8 3.5 - 5.1 mmol/L   Chloride 101 101 - 111 mmol/L   BUN 80 (H) 6 - 20 mg/dL   Creatinine, Ser 2.40 (H) 0.44 - 1.00 mg/dL   Glucose, Bld 218 (H) 65 - 99 mg/dL   Calcium, Ion 1.08 (L) 1.13 - 1.30 mmol/L   TCO2 29 0 - 100 mmol/L   Hemoglobin 13.6 12.0 - 15.0 g/dL   HCT 40.0 36.0 - 46.0 %  CBG monitoring, ED     Status: Abnormal   Collection Time: 07/13/15  4:45 PM  Result Value Ref Range   Glucose-Capillary 188 (H) 65 - 99 mg/dL  Ethanol     Status: None   Collection Time: 07/13/15  4:53 PM  Result Value Ref Range   Alcohol, Ethyl (B) <5 <5 mg/dL    Comment:        LOWEST DETECTABLE LIMIT FOR SERUM ALCOHOL IS 5 mg/dL FOR MEDICAL PURPOSES ONLY    Ct Head Wo Contrast  07/13/2015  CLINICAL DATA:  Slurred speech and right arm weakness, initial encounter EXAM: CT HEAD WITHOUT CONTRAST TECHNIQUE: Contiguous axial images were obtained from the base of the skull through the vertex without intravenous contrast. COMPARISON:  None. FINDINGS: Bony calvarium is intact. No gross soft tissue abnormality is noted. Some mucosal thickening is noted within the right sphenoid sinus. Diffuse basal ganglia calcifications are noted. No findings to suggest acute hemorrhage, acute infarction or space-occupying mass lesion are noted. Mild atrophic changes are noted. IMPRESSION: No acute intracranial abnormality noted. Critical Value/emergent results were called by telephone at the time of interpretation on 07/13/2015 at 4:43 pm to Dr. Nicole Kindred, who verbally acknowledged these results. Electronically Signed   By: Inez Catalina M.D.   On: 07/13/2015 16:46    Assessment: 74 y.o. female  with multiple risk factors for stroke presenting with probable TIA and possible acute subcortical left cerebral infarction.  Stroke Risk Factors - atrial fibrillation, carotid stenosis, diabetes mellitus and hypertension  Plan: 1. HgbA1c, fasting  lipid panel 2. MRI, MRA  of the brain without contrast 3. PT consult, OT consult, Speech consult 4. Echocardiogram 5. Carotid dopplers 6. Prophylactic therapy-Antiplatelet med: Aspirin  7. Risk factor modification 8. Telemetry monitoring  C.R. Nicole Kindred, MD [facility] [phone]  07/13/2015, 5:40 PM

## 2015-07-13 NOTE — ED Notes (Signed)
Patient transported to MRI 

## 2015-07-13 NOTE — Code Documentation (Signed)
74yo female arriving to [facility] via [facility] at [phone].  EMS reports that they were called out for stroke symptoms and reports that patient was having difficulty with speech.  LKW 1330.  Family reports patient was repeating back phrases that family said to her and saying "cold" over and over.  EMS assessed patient's speech to be normal on their assessment, but during transport patient noticed to have right arm weakness at 1546.  Code stroke activated.  Stroke team to the bedside.  Patient cleared by Dr. Darl Householder and patient to CT.  Labs drawn by RN in CT.  Patient to E47.  NIHSS 3, see documentation for details and code stroke times.  Patient with right arm and leg drift and decreased sensation in the right face and arm and left leg.  Patient reports pain to right arm when lifting.  Dr. Nicole Kindred at the bedside for exam.  No acute stroke treatment at this time.  Patient remains in the window to treat with tPA until 1800 should symptoms worsen.  Bedside handoff with ED RN Remo Lipps.

## 2015-07-13 NOTE — H&P (Signed)
[facility] History and Physical  [name] [id number] DOB: 09-05-1940 DOA: 07/13/2015  Referring physician: ED physician PCP: Henrine Screws, MD  Specialists:   Chief Complaint: difficulty speaking, right arm and leg numbness and weakness.  HPI: [name] is a 74 y.o. female with PMH of hypertension, diabetes mellitus, GERD, gout, depression, CAD, S/P CABG 2013, AV fistula placement, atrial fibrillation not on Coumadin (possibly due to history of peptic ulcer disease), PUD, carotid artery stenosis (s/p of bilateral endarterectomy), stroke, chronic kidney disease-stage V, melanoma, who presents with difficulty speaking, right arm and leg numbness and weakness.  Patient reports that she started having difficulty speaking at about 1:30 PM. She also had right arm and the leg numbness and weakness, which have resolved. Per her daughter, pt was repeating herself and repeating others. Patient does not have vision change, hearing loss, urinary incontinence, seizure activity, chest pain, shortness of breath, cough, abdominal pain, diarrhea, symptoms of UTI. Patient had fall long time ago, causing her right shoulder pain.  In ED, patient was found to have negative CT-head for acute abnormalities, INR 1.01, negative UDS, negative urinalysis, WBC 11.3, temperature normal, no tachycardia, stable renal function, negative chest x-ray. Negative right shoulder x-ray for bony abnormalities. Patient's admitted to inpatient for further evaluation and treatment. Neurology was consulted.  Where does patient live?   At home  Can patient participate in ADLs? little  Review of Systems:   General: no fevers, chills, no changes in body weight, has fatigue HEENT: no blurry vision, hearing changes or sore throat Pulm: no dyspnea, coughing, wheezing CV: no chest pain, palpitations Abd: no nausea, vomiting, abdominal pain, diarrhea, constipation GU: no dysuria, burning on urination, increased  urinary frequency, hematuria  Ext: no leg edema Neuro: had R arm and leg weakness and numbness, had difficult speaking, no vision change or hearing loss.  Skin: no rash MSK: No muscle spasm, no deformity, no limitation of range of movement in spin. Has r shoulder pain. Heme: No easy bruising.  Travel history: No recent long distant travel.  Allergy:  Allergies  Allergen Reactions  . Gabapentin Other (See Comments)    hallucinations    Past Medical History  Diagnosis Date  . Anemia   . Glaucoma   . Hypertension   . Blood transfusion   . GERD (gastroesophageal reflux disease)   . Arthritis   . Neuromuscular disorder ([place])     CARPEL TUNNEL  . Atrial fibrillation ([place])   . CAD (coronary artery disease)   . Carotid artery occlusion     Carotid Endartectom,y - left 2009.  Blockage Right being watched by Dr Scot Dock.  Marland Kitchen HOH (hard of hearing)   . Shortness of breath   . Depression   . History of kidney stones     passed  . Carotid stenosis   . Complication of anesthesia     pt. states that she was difficult to wake  . Stroke ([place])     hx of TIA  . 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 Cancer ([place])     .  top of head- melonoma  . Dysrhythmia   . History of pneumonia   . Diabetes mellitus without complication [facility])     Past Surgical History  Procedure Laterality Date  . Carpel tunnel    . Neck surgery    . Esophagogastroduodenoscopy  07/25/2011    Procedure: ESOPHAGOGASTRODUODENOSCOPY (EGD);  Surgeon: Winfield Cunas., MD;  Location: [facility] ENDOSCOPY;  Service: Endoscopy;  Laterality: N/A;  . Colonoscopy  07/25/2011    Procedure: COLONOSCOPY;  Surgeon: Winfield Cunas., MD;  Location: [facility] ENDOSCOPY;  Service: Endoscopy;  Laterality: N/A;  . Coronary artery bypass graft  07/29/2011    Procedure: CORONARY ARTERY BYPASS GRAFTING (CABG);  Surgeon: Gaye Pollack, MD;  Location: [place];  Service: Open Heart Surgery;  Laterality: N/A;  . Carotid endarterectomy  Left 2009     CEA  . Av fistula placement Left 05/31/2014    Procedure: Creation of Left Arm arteriovenous brachiocephalic Fistula;  Surgeon: Angelia Mould, MD;  Location: [place];  Service: Vascular;  Laterality: Left;  . Fistulogram Left 08/29/2014    Procedure: FISTULOGRAM;  Surgeon: Angelia Mould, MD;  Location: [facility] CATH LAB;  Service: Cardiovascular;  Laterality: Left;  . Av fistula placement Left 09/01/2014    Procedure: INSERTION OF ARTERIOVENOUS (AV) GORE-TEX GRAFT LEFT UPPER ARM USING  4-7 MM X 45 CM SRTETCH GORETEX GRAFT;  Surgeon: Angelia Mould, MD;  Location: [place];  Service: Vascular;  Laterality: Left;  . Cardiac catheterization    . Tonsillectomy    . Eye surgery    . Endarterectomy Right 04/21/2015    Procedure: ENDARTERECTOMY CAROTID;  Surgeon: Angelia Mould, MD;  Location: [place];  Service: Vascular;  Laterality: Right;    Social History:  reports that she has never smoked. She has never used smokeless tobacco. She reports that she does not drink alcohol or use illicit drugs.  Family History:  Family History  Problem Relation Age of Onset  . Diabetes Mother   . Hypertension Mother   . Heart disease Mother     beofre age 33  . Heart attack Mother   . Cancer Father   . Hyperlipidemia Father   . Hypertension Father   . Deep vein thrombosis Daughter   . Diabetes Daughter   . Hyperlipidemia Daughter   . Hypertension Daughter   . Heart disease Daughter   . Peripheral vascular disease Daughter      Prior to Admission medications   Medication Sig Start Date End Date Taking? Authorizing Provider  allopurinol (ZYLOPRIM) 100 MG tablet Take 200 mg by mouth daily.    Yes Historical Provider, MD  amLODipine (NORVASC) 10 MG tablet TAKE ONE TABLET BY MOUTH ONE  TIME DAILY 06/16/15  Yes Jettie Booze, MD  aspirin 325 MG tablet Take 325 mg by mouth daily.   Yes Historical Provider, MD  atorvastatin (LIPITOR) 10 MG tablet TAKE ONE TABLET BY MOUTH  ONE TIME DAILY Patient taking differently: Take 10 mg by mouth daily.  02/21/14  Yes Jettie Booze, MD  B Complex Vitamins (VITAMIN B COMPLEX PO) Take 1 tablet by mouth daily.   Yes Historical Provider, MD  brimonidine (ALPHAGAN P) 0.1 % SOLN Place 1 drop into the left eye 2 (two) times daily.   Yes Historical Provider, MD  calcitRIOL (ROCALTROL) 0.25 MCG capsule Take 0.25 mcg by mouth every other day.   Yes Historical Provider, MD  Cholecalciferol (VITAMIN D) 2000 UNITS tablet Take 2,000 Units by mouth 2 (two) times daily.   Yes Historical Provider, MD  citalopram (CELEXA) 20 MG tablet Take 20 mg by mouth daily.   Yes Historical Provider, MD  fish oil-omega-3 fatty acids 1000 MG capsule Take 2 g by mouth daily.   Yes Historical Provider, MD  furosemide (LASIX) 40 MG tablet Take 80 mg by mouth 2 (two)  times daily.  12/01/11  Yes Historical Provider, MD  HUMALOG 100 UNIT/ML injection Inject 0-12 Units into the skin 3 (three) times daily with meals. Sliding scale 04/17/14  Yes Historical Provider, MD  HYDROcodone-acetaminophen (NORCO/VICODIN) 5-325 MG tablet Take 1-2 tablets by mouth every 4 (four) hours as needed for moderate pain. 04/21/15  Yes Samantha J Rhyne, PA-C  insulin glargine (LANTUS) 100 UNIT/ML injection Inject 20-30 Units into the skin at bedtime.    Yes Historical Provider, MD  linagliptin (TRADJENTA) 5 MG TABS tablet Take 1 tablet (5 mg total) by mouth daily with breakfast. 08/02/11  Yes Donielle Liston Alba, PA-C  losartan (COZAAR) 100 MG tablet Take 1 tablet by mouth daily. 05/19/15  Yes Historical Provider, MD  metoprolol (LOPRESSOR) 50 MG tablet Take 50 mg by mouth 2 (two) times daily.   Yes Historical Provider, MD  omeprazole (PRILOSEC) 20 MG capsule Take 20 mg by mouth daily as needed. Acid reflux   Yes Historical Provider, MD  timolol (TIMOPTIC) 0.5 % ophthalmic solution Place 1 drop into the left eye daily.   Yes Historical Provider, MD  travoprost, benzalkonium, (TRAVATAN)  0.004 % ophthalmic solution Place 1 drop into the left eye at bedtime.   Yes Historical Provider, MD  acetaminophen (TYLENOL) 500 MG tablet Take 500 mg by mouth every 6 (six) hours as needed. For pain    Historical Provider, MD  colchicine 0.6 MG tablet Take 0.6 mg by mouth 2 (two) times daily. gout    Historical Provider, MD  oxyCODONE (ROXICODONE) 5 MG immediate release tablet Take 1 tablet (5 mg total) by mouth every 6 (six) hours as needed. 09/01/14   Gabriel Earing, PA-C    Physical Exam: Filed Vitals:   07/13/15 1755 07/13/15 1930 07/13/15 1937 07/13/15 2000  BP: 155/47 156/73 128/59 153/79  Pulse: 86 81 97 80  Temp:      TempSrc:      Resp:  19 29 14   Weight:      SpO2: 98% 98% 100% 97%   General: Not in acute distress HEENT:       Eyes: PERRL, EOMI, no scleral icterus.       ENT: No discharge from the ears and nose, no pharynx injection, no tonsillar enlargement.        Neck: No JVD, no bruit, no mass felt. Heme: No neck lymph node enlargement. Cardiac: S1/S2, RRR, No murmurs, No gallops or rubs. Pulm:  No rales, wheezing, rhonchi or rubs. Abd: Soft, nondistended, nontender, no rebound pain, no organomegaly, BS present. Ext: No pitting leg edema bilaterally. 2+DP/PT pulse bilaterally. Has functioning AVF in left upper arm. Musculoskeletal: No joint deformities, No joint redness or warmth, no limitation of ROM in spin. Tenderness over R shoulder. Skin: No rashes.  Neuro: Alert, oriented X3, cranial nerves II-XII grossly intact, muscle strength 5/5 in all extremities, sensation to light touch intact. Knee reflex 1+ bilaterally. Negative Babinski's sign. Normal finger to nose test. Psych: Patient is not psychotic, no suicidal or hemocidal ideation.  Labs on Admission:  Basic Metabolic Panel:  Recent Labs Lab 07/13/15 1630 07/13/15 1645  NA 142 141  K 3.8 3.8  CL 102 101  CO2 26  --   GLUCOSE 220* 218*  BUN 79* 80*  CREATININE 2.41* 2.40*  CALCIUM 9.1  --    Liver  Function Tests:  Recent Labs Lab 07/13/15 1630  AST 19  ALT 13*  ALKPHOS 75  BILITOT 0.6  PROT 7.1  ALBUMIN 3.5  No results for input(s): LIPASE, AMYLASE in the last 168 hours. No results for input(s): AMMONIA in the last 168 hours. CBC:  Recent Labs Lab 07/13/15 1630 07/13/15 1645  WBC 11.3*  --   NEUTROABS 8.1*  --   HGB 12.6 13.6  HCT 37.7 40.0  MCV 92.2  --   PLT 178  --    Cardiac Enzymes: No results for input(s): CKTOTAL, CKMB, CKMBINDEX, TROPONINI in the last 168 hours.  BNP (last 3 results) No results for input(s): BNP in the last 8760 hours.  ProBNP (last 3 results) No results for input(s): PROBNP in the last 8760 hours.  CBG:  Recent Labs Lab 07/13/15 1645  GLUCAP 188*    Radiological Exams on Admission: Dg Chest 2 View  07/13/2015  CLINICAL DATA:  Chest pain. EXAM: CHEST  2 VIEW COMPARISON:  05/31/2014 chest radiograph. FINDINGS: Median sternotomy wires are aligned and intact. Stable cardiomediastinal silhouette with mild cardiomegaly. No pneumothorax. No pleural effusion. Clear lungs, with no focal lung consolidation and no pulmonary edema. IMPRESSION: Stable mild cardiomegaly without overt pulmonary edema. No active pulmonary disease. Electronically Signed   By: Ilona Sorrel M.D.   On: 07/13/2015 18:59   Dg Shoulder Right  07/13/2015  CLINICAL DATA:  74 year old female with right shoulder pain.  The EXAM: RIGHT SHOULDER - 2+ VIEW COMPARISON:  None. FINDINGS: No acute fracture or dislocation. A 5 mm sclerotic lesion in the proximal aspect of the humeral diaphysis likely represents a bone island. The soft tissues appear unremarkable with IMPRESSION: Negative. Electronically Signed   By: Anner Crete M.D.   On: 07/13/2015 18:59   Ct Head Wo Contrast  07/13/2015  CLINICAL DATA:  Slurred speech and right arm weakness, initial encounter EXAM: CT HEAD WITHOUT CONTRAST TECHNIQUE: Contiguous axial images were obtained from the base of the skull through  the vertex without intravenous contrast. COMPARISON:  None. FINDINGS: Bony calvarium is intact. No gross soft tissue abnormality is noted. Some mucosal thickening is noted within the right sphenoid sinus. Diffuse basal ganglia calcifications are noted. No findings to suggest acute hemorrhage, acute infarction or space-occupying mass lesion are noted. Mild atrophic changes are noted. IMPRESSION: No acute intracranial abnormality noted. Critical Value/emergent results were called by telephone at the time of interpretation on 07/13/2015 at 4:43 pm to Dr. Nicole Kindred, who verbally acknowledged these results. Electronically Signed   By: Inez Catalina M.D.   On: 07/13/2015 16:46    EKG: Independently reviewed. QTC 557,bifascicular block ( pt had right bundle blockage on ekg 05/31/14)..   Assessment/Plan Principal Problem:   Stroke [facility]) Active Problems:   Coronary arteriosclerosis   Essential hypertension   Hyperlipidemia   Carotid artery stenosis   Hypertension   Glaucoma   GERD (gastroesophageal reflux disease)   Atrial fibrillation (HCC)   CAD (coronary artery disease)   Depression   Diabetes mellitus without complication ([place])   Chronic kidney disease (CKD), stage V ([place])   Stroke (cerebrum) ([place])   Stroke [facility]): pt's symptoms are concerning for TIA and possible acute subcortical left cerebral infarction. Neurology was consulted. Dr. Nicole Kindred saw the patient, recommended stroke workup.  -will admit to tele bed -appreciate Dr. Oneita Hurt consultation, with follow-up recommendations as follows:  1. HgbA1c, fasting lipid panel  2. MRI, MRA of the brain without contrast  3. PT consult, OT consult, Speech consult  4. Echocardiogram  5. Carotid dopplers  6. Prophylactic therapy-Antiplatelet med: Aspirin   7. Risk factor modification  8. Telemetry monitoring -continue  Lipitor  CAD: s/p of CABG 2013. No CP -continue aspirin, Lipitor and metoprolol  Essential hypertension: -Continue  home meds: amlodipine, metoprolol, losartan -hold her Lasix tonight while patient is on nothing by mouth  HLD: Last LDL was not on record -Continue home medications: B mature -Check FLP  Carotid artery stenosis: s/p of bilateral endarterectomy. -On aspirin  Hx of Atrial Fibrillation: CHA2DS2-VASc Score is 8, needs oral anticoagulation, but patient is not on ACn at home. Per pt's Daughter, patient has a history of peptic ulcer, which is the possibl reason why patient is not taking AC. Currently patient is in sinus rhythm. -will continue ASA and metoprolol.  GERD: -Protonix  DM-II: Last A1c 6.6 on 03/30/15, well controled. Patient is takingLantus, Humalog, Tradjenta at home -cotinune Lantus 20 U daily -SSI -Check A1c  Chronic kidney disease (CKD), stage V ([place]): s/p AVF. Stable. -f/u renal Fx by BMP  DVT ppx: SQ Heparin   Code Status: Partial code ([place] with CPR, but not intubation) Family Communication:  Yes, patient's daughter and granddaughter  at bed side Disposition Plan: Admit to inpatient   Date of Service 07/13/2015    Ivor Costa [facility] Pager [phone]  If 7PM-7AM, please contact night-coverage [email or web address] Password [facility] 07/13/2015, 9:25 PM

## 2015-07-13 NOTE — ED Notes (Signed)
Pt still at MRI

## 2015-07-14 ENCOUNTER — Inpatient Hospital Stay (HOSPITAL_COMMUNITY): Payer: Medicare Other

## 2015-07-14 ENCOUNTER — Inpatient Hospital Stay (HOSPITAL_COMMUNITY)
Admit: 2015-07-14 | Discharge: 2015-07-14 | Disposition: A | Discharge status: Home / Self Care | Payer: Medicare Other | Attending: Neurology | Admitting: Neurology

## 2015-07-14 ENCOUNTER — Encounter (HOSPITAL_COMMUNITY): Payer: Self-pay | Admitting: *Deleted

## 2015-07-14 DIAGNOSIS — I639 Cerebral infarction, unspecified: Secondary | ICD-10-CM

## 2015-07-14 DIAGNOSIS — R410 Disorientation, unspecified: Secondary | ICD-10-CM | POA: Insufficient documentation

## 2015-07-14 DIAGNOSIS — I6789 Other cerebrovascular disease: Secondary | ICD-10-CM

## 2015-07-14 DIAGNOSIS — F05 Delirium due to known physiological condition: Secondary | ICD-10-CM

## 2015-07-14 DIAGNOSIS — G9340 Encephalopathy, unspecified: Secondary | ICD-10-CM | POA: Insufficient documentation

## 2015-07-14 LAB — T4, FREE: FREE T4: 0.77 ng/dL (ref 0.61–1.12)

## 2015-07-14 LAB — COMPREHENSIVE METABOLIC PANEL
ALBUMIN: 3.1 g/dL — AB (ref 3.5–5.0)
ALK PHOS: 59 U/L (ref 38–126)
ALT: 12 U/L — ABNORMAL LOW (ref 14–54)
ANION GAP: 13 (ref 5–15)
AST: 15 U/L (ref 15–41)
BILIRUBIN TOTAL: 0.4 mg/dL (ref 0.3–1.2)
BUN: 82 mg/dL — AB (ref 6–20)
CALCIUM: 9 mg/dL (ref 8.9–10.3)
CO2: 26 mmol/L (ref 22–32)
Chloride: 102 mmol/L (ref 101–111)
Creatinine, Ser: 2.6 mg/dL — ABNORMAL HIGH (ref 0.44–1.00)
GFR calc Af Amer: 20 mL/min — ABNORMAL LOW (ref >60)
GFR, EST NON AFRICAN AMERICAN: 17 mL/min — AB (ref >60)
GLUCOSE: 177 mg/dL — AB (ref 65–99)
Potassium: 3.9 mmol/L (ref 3.5–5.1)
Sodium: 141 mmol/L (ref 135–145)
TOTAL PROTEIN: 6.1 g/dL — AB (ref 6.5–8.1)

## 2015-07-14 LAB — URINE CULTURE

## 2015-07-14 LAB — LIPID PANEL
CHOL/HDL RATIO: 4.4 ratio
Cholesterol: 177 mg/dL (ref 0–200)
HDL: 40 mg/dL — AB (ref >40)
LDL CALC: 81 mg/dL (ref 0–99)
Triglycerides: 280 mg/dL — ABNORMAL HIGH (ref <150)
VLDL: 56 mg/dL — AB (ref 0–40)

## 2015-07-14 LAB — GLUCOSE, CAPILLARY
GLUCOSE-CAPILLARY: 103 mg/dL — AB (ref 65–99)
GLUCOSE-CAPILLARY: 170 mg/dL — AB (ref 65–99)
Glucose-Capillary: 119 mg/dL — ABNORMAL HIGH (ref 65–99)
Glucose-Capillary: 204 mg/dL — ABNORMAL HIGH (ref 65–99)

## 2015-07-14 LAB — CBC
HCT: 33.8 % — ABNORMAL LOW (ref 36.0–46.0)
Hemoglobin: 11 g/dL — ABNORMAL LOW (ref 12.0–15.0)
MCH: 30.7 pg (ref 26.0–34.0)
MCHC: 32.5 g/dL (ref 30.0–36.0)
MCV: 94.4 fL (ref 78.0–100.0)
PLATELETS: 161 10*3/uL (ref 150–400)
RBC: 3.58 MIL/uL — ABNORMAL LOW (ref 3.87–5.11)
RDW: 15.1 % (ref 11.5–15.5)
WBC: 10.2 10*3/uL (ref 4.0–10.5)

## 2015-07-14 LAB — VITAMIN B12: Vitamin B-12: 4206 pg/mL — ABNORMAL HIGH (ref 180–914)

## 2015-07-14 LAB — TSH: TSH: 2.039 u[IU]/mL (ref 0.350–4.500)

## 2015-07-14 LAB — AMMONIA: AMMONIA: 14 umol/L (ref 9–35)

## 2015-07-14 NOTE — Progress Notes (Signed)
*  PRELIMINARY RESULTS* Vascular Ultrasound Carotid Duplex (Doppler) has been completed.   Findings suggest 1-39% internal carotid artery stenosis bilaterally. The left vertebral artery is patent with antegrade flow. Unable to visualize the right vertebral artery.  07/14/2015 12:01 PM Maudry Mayhew, RVT, RDCS, RDMS

## 2015-07-14 NOTE — Progress Notes (Signed)
EEG completed; results pending.    

## 2015-07-14 NOTE — Progress Notes (Signed)
Check pt again in the afternoon. She seems much improved. She was somehow sleepy but on awakening, she was quit in high spirit. Answer questions appropriately and orientated. Daughter still think she is not herself yet.   On exam, she is orientated to time, place and people, know the incoming president but not knowing the current president. Able to spell WORLD forward but not backward, not able to do calculation, still has perseveration but language improved, able to name 4/5 and repeat well. Follows simple commands well. Moving all extremities and neck supple, no meningismus.  EEG -  this is an abnormal awake and asleep EEG that demonstrated findings consistent with a mild non specific encephalopathy. No electrographic seizures seen.  CUS - Findings suggest 1-39% internal carotid artery stenosis bilaterally. The left vertebral artery is patent with antegrade flow. Unable to visualize the right vertebral artery.  Ammonia level normal. UDS negative. Afebrile and WBC 11.3  Due to clinical improvement and no clear indication for meningitis and encephalitis, will hold off LP for now. Repeat EEG in am. Check CBC, CMP, B12, TSH, Free T4, thyroid antibodies, blood culture and RPR and HIV.   The patient is at risk for worsening delirium and AMS and she needs ongoing neuro evaluation and management.  Rosalin Hawking, MD PhD Stroke Neurology 07/14/2015 5:09 PM

## 2015-07-14 NOTE — Progress Notes (Signed)
PATIENT DETAILS Name: [name] Age: 74 y.o. Sex: female Date of Birth: 08-Jul-1941 Admit Date: 07/13/2015 Admitting Physician Ivor Costa, MD [id number] NEVILL, MD  Subjective: Still appears slightly confused-but better than yesterday. Family at bedside-still not at baseline.  Assessment/Plan: Principal Problem: Acute encephalopathy: Etiology remains uncertain, MRI brain negative for CVA, EEG negative for seizure activity. Although slightly improved still not back to her usual baseline. No other foci of infection apparent-UA/chest x-ray negative. Neck appears supple-we will go ahead and empirically start on acyclovir.neurology contemplating NP if no improvement.    Active Problems: ? Atrial fibrillation:I do not see any documentation of atrial fibrillation and a prior notes oriented EKGs. Spoke with patient's primary cardiologist -Dr Aris [place] suspects patient may have had transient A. fib after CABG a few years ago. If needed-he suggested outpatient 30 day monitor. Continue to monitor in telemetry.   CKD stage IV: Creatinine close to usual baseline,  follow electrolytes.  CAD: s/p of CABG 2013. No CP-continue aspirin, Lipitor and metoprolol  Essential hypertension: Controlled-continue metoprolol. Resume amlodipine, losartan when able.  Dyslipidemia: Continue statin   History of carotid stenosis:status post bilateral carotid endarterectomy-continue aspirin and statin.   GERD: Continue PPI  Type 2 diabetes: CBGs stable, continue Lantus 20 units, and SSI  Gout: Stable without any evidence of flare-continue colchicine  Disposition: Remain inpatient  Antimicrobial agents  See below  Anti-infectives    None      DVT Prophylaxis: Prophylactic Heparin   Code Status: Full code   Family Communication Daughter at bedside  Procedures: None  CONSULTS:  neurology  Time spent 40 minutes-Greater than 50% of this time was spent in  counseling, explanation of diagnosis, planning of further management, and coordination of care.  MEDICATIONS: Scheduled Meds: . allopurinol  200 mg Oral Daily  . aspirin  325 mg Oral Daily  . atorvastatin  10 mg Oral Daily  . B-complex with vitamin C  1 tablet Oral Daily  . brimonidine  1 drop Left Eye BID  . [START ON 07/15/2015] calcitRIOL  0.25 mcg Oral QODAY  . cholecalciferol  2,000 Units Oral BID  . citalopram  20 mg Oral Daily  . colchicine  0.6 mg Oral BID  . heparin  5,000 Units Subcutaneous 3 times per day  . insulin aspart  0-9 Units Subcutaneous TID WC  . insulin glargine  20 Units Subcutaneous QHS  . metoprolol  50 mg Oral BID  . omega-3 acid ethyl esters  2 g Oral Daily  . pantoprazole  40 mg Oral Daily  . timolol  1 drop Left Eye Daily  . Travoprost (BAK Free)  1 drop Left Eye QHS   Continuous Infusions:  PRN Meds:.acetaminophen, HYDROcodone-acetaminophen, senna-docusate    PHYSICAL EXAM: Vital signs in last 24 hours: Filed Vitals:   07/14/15 0154 07/14/15 0350 07/14/15 0550 07/14/15 0858  BP: 114/67 113/52 126/55 146/60  Pulse: 71 74 62 60  Temp: 98.8 F (37.1 C) 98.7 F (37.1 C) 97.3 F (36.3 C) 98.2 F (36.8 C)  TempSrc: Oral Oral Axillary Axillary  Resp: 18 20 20 17   Height:      Weight:      SpO2: 95% 97% 98% 98%    Weight change:  Filed Weights   07/13/15 1643 07/13/15 2156  Weight: 87.5 kg (192 lb 14.4 oz) 84.596 kg (186 lb 8 oz)   Body mass index is 31.04 kg/(m^2).  Gen Exam: Awake,mildly confused with clear speech.   Neck: Supple, No JVD.   Chest: B/L Clear.   CVS: S1 S2 Regular, no murmurs.  Abdomen: soft, BS +, non tender, non distended.  Extremities: no edema, lower extremities warm to touch. Neurologic: Non Focal.   Skin: No Rash.   Wounds: N/A.    Intake/Output from previous day: No intake or output data in the 24 hours ending 07/14/15 1442   LAB RESULTS: CBC  Recent Labs Lab 07/13/15 1630 07/13/15 1645  WBC 11.3*   --   HGB 12.6 13.6  HCT 37.7 40.0  PLT 178  --   MCV 92.2  --   MCH 30.8  --   MCHC 33.4  --   RDW 15.0  --   LYMPHSABS 2.4  --   MONOABS 0.7  --   EOSABS 0.2  --   BASOSABS 0.0  --     Chemistries   Recent Labs Lab 07/13/15 1630 07/13/15 1645  NA 142 141  K 3.8 3.8  CL 102 101  CO2 26  --   GLUCOSE 220* 218*  BUN 79* 80*  CREATININE 2.41* 2.40*  CALCIUM 9.1  --     CBG:  Recent Labs Lab 07/13/15 1645 07/13/15 2218 07/14/15 0631 07/14/15 1216  GLUCAP 188* 176* 103* 119*    GFR Estimated Creatinine Clearance: 21.7 mL/min (by C-G formula based on Cr of 2.4).  Coagulation profile  Recent Labs Lab 07/13/15 1630  INR 1.01    Cardiac Enzymes No results for input(s): CKMB, TROPONINI, MYOGLOBIN in the last 168 hours.  Invalid input(s): CK  Invalid input(s): POCBNP No results for input(s): DDIMER in the last 72 hours. No results for input(s): HGBA1C in the last 72 hours.  Recent Labs  07/13/15 1630  CHOL 177  HDL 40*  LDLCALC 81  TRIG 280*  CHOLHDL 4.4   No results for input(s): TSH, T4TOTAL, T3FREE, THYROIDAB in the last 72 hours.  Invalid input(s): FREET3 No results for input(s): VITAMINB12, FOLATE, FERRITIN, TIBC, IRON, RETICCTPCT in the last 72 hours. No results for input(s): LIPASE, AMYLASE in the last 72 hours.  Urine Studies No results for input(s): UHGB, CRYS in the last 72 hours.  Invalid input(s): UACOL, UAPR, USPG, UPH, UTP, UGL, UKET, UBIL, UNIT, UROB, ULEU, UEPI, UWBC, URBC, UBAC, CAST, UCOM, BILUA  MICROBIOLOGY: Recent Results (from the past 240 hour(s))  Urine culture     Status: None   Collection Time: 07/13/15  6:00 PM  Result Value Ref Range Status   Specimen Description URINE, RANDOM  Final   Special Requests NONE  Final   Culture MULTIPLE SPECIES PRESENT, SUGGEST RECOLLECTION  Final   Report Status 07/14/2015 FINAL  Final    RADIOLOGY STUDIES/RESULTS: Dg Chest 2 View  07/13/2015  CLINICAL DATA:  Chest pain.  EXAM: CHEST  2 VIEW COMPARISON:  05/31/2014 chest radiograph. FINDINGS: Median sternotomy wires are aligned and intact. Stable cardiomediastinal silhouette with mild cardiomegaly. No pneumothorax. No pleural effusion. Clear lungs, with no focal lung consolidation and no pulmonary edema. IMPRESSION: Stable mild cardiomegaly without overt pulmonary edema. No active pulmonary disease. Electronically Signed   By: Ilona Sorrel M.D.   On: 07/13/2015 18:59   Dg Shoulder Right  07/13/2015  CLINICAL DATA:  74 year old female with right shoulder pain.  The EXAM: RIGHT SHOULDER - 2+ VIEW COMPARISON:  None. FINDINGS: No acute fracture or dislocation. A 5 mm sclerotic lesion in the proximal aspect of the humeral diaphysis likely  represents a bone island. The soft tissues appear unremarkable with IMPRESSION: Negative. Electronically Signed   By: Anner Crete M.D.   On: 07/13/2015 18:59   Ct Head Wo Contrast  07/13/2015  CLINICAL DATA:  Slurred speech and right arm weakness, initial encounter EXAM: CT HEAD WITHOUT CONTRAST TECHNIQUE: Contiguous axial images were obtained from the base of the skull through the vertex without intravenous contrast. COMPARISON:  None. FINDINGS: Bony calvarium is intact. No gross soft tissue abnormality is noted. Some mucosal thickening is noted within the right sphenoid sinus. Diffuse basal ganglia calcifications are noted. No findings to suggest acute hemorrhage, acute infarction or space-occupying mass lesion are noted. Mild atrophic changes are noted. IMPRESSION: No acute intracranial abnormality noted. Critical Value/emergent results were called by telephone at the time of interpretation on 07/13/2015 at 4:43 pm to Dr. Nicole Kindred, who verbally acknowledged these results. Electronically Signed   By: Inez Catalina M.D.   On: 07/13/2015 16:46   Mr Brain Wo Contrast  07/13/2015  CLINICAL DATA:  New onset speech changes and RIGHT-sided weakness. History of hypertension, diabetes, atrial  fibrillation, carotid stenosis and carotid endarterectomy. EXAM: MRI HEAD WITHOUT CONTRAST MRA HEAD WITHOUT CONTRAST TECHNIQUE: Multiplanar, multiecho pulse sequences of the brain and surrounding structures were obtained without intravenous contrast. Angiographic images of the head were obtained using MRA technique without contrast. COMPARISON:  CT head July 13, 2015 at 1634 hours. FINDINGS: MRI HEAD FINDINGS Multiple sequences are mildly motion degraded. The ventricles and sulci are normal for patient's age. No abnormal parenchymal signal, mass lesions, mass effect. No reduced diffusion to suggest acute ischemia. Nonspecific punctate focus of susceptibility artifact LEFT cerebellum, RIGHT body of the corpus callosum. No abnormal extra-axial fluid collections. No extra-axial masses though, contrast enhanced sequences would be more sensitive. Normal major intracranial vascular flow voids seen at the skull base. Status post bilateral ocular lens implants, orbital contents are otherwise unremarkable. No abnormal sellar expansion. No suspicious calvarial bone marrow signal. Craniocervical junction maintained. Pan paranasal sinusitis, severe RIGHT sphenoid sinusitis. The mastoid air cells are well aerated. MRA HEAD FINDINGS Moderately motion degraded examination. Anterior circulation: Flow related enhancement of the included cervical, petrous, cavernous and supraclinoid internal carotid arteries. Mild stenosis suspected bilateral supraclinoid internal carotid arteries in RIGHT cavernous carotid artery though motion degrades sensitivity. Patent anterior communicating artery. Normal flow related enhancement of the anterior and middle cerebral arteries, including distal segments. Supernumerary anterior cerebral artery arising from LEFT A1-2 junction. No large vessel occlusion, definite high-grade stenosis, abnormal luminal irregularity, aneurysm. Posterior circulation: LEFT vertebral artery is dominant. Basilar artery  is patent, with normal flow related enhancement of the main branch vessels. Normal flow related enhancement of the posterior cerebral arteries. Bilateral posterior communicating arteries present. No large vessel occlusion, definite high-grade stenosis, abnormal luminal irregularity, aneurysm. IMPRESSION: MRI HEAD: No acute intracranial process, specifically no acute ischemia on this mildly motion degraded examination. Negative noncontrast MRI brain for age. MRA HEAD: No acute large vessel occlusion or definite high-grade stenosis on this moderately motion degraded examination. Electronically Signed   By: Elon Alas M.D.   On: 07/13/2015 22:00   Mr Jodene Nam Head/brain Wo Cm  07/13/2015  CLINICAL DATA:  New onset speech changes and RIGHT-sided weakness. History of hypertension, diabetes, atrial fibrillation, carotid stenosis and carotid endarterectomy. EXAM: MRI HEAD WITHOUT CONTRAST MRA HEAD WITHOUT CONTRAST TECHNIQUE: Multiplanar, multiecho pulse sequences of the brain and surrounding structures were obtained without intravenous contrast. Angiographic images of the head were obtained using MRA technique  without contrast. COMPARISON:  CT head July 13, 2015 at 1634 hours. FINDINGS: MRI HEAD FINDINGS Multiple sequences are mildly motion degraded. The ventricles and sulci are normal for patient's age. No abnormal parenchymal signal, mass lesions, mass effect. No reduced diffusion to suggest acute ischemia. Nonspecific punctate focus of susceptibility artifact LEFT cerebellum, RIGHT body of the corpus callosum. No abnormal extra-axial fluid collections. No extra-axial masses though, contrast enhanced sequences would be more sensitive. Normal major intracranial vascular flow voids seen at the skull base. Status post bilateral ocular lens implants, orbital contents are otherwise unremarkable. No abnormal sellar expansion. No suspicious calvarial bone marrow signal. Craniocervical junction maintained. Pan  paranasal sinusitis, severe RIGHT sphenoid sinusitis. The mastoid air cells are well aerated. MRA HEAD FINDINGS Moderately motion degraded examination. Anterior circulation: Flow related enhancement of the included cervical, petrous, cavernous and supraclinoid internal carotid arteries. Mild stenosis suspected bilateral supraclinoid internal carotid arteries in RIGHT cavernous carotid artery though motion degrades sensitivity. Patent anterior communicating artery. Normal flow related enhancement of the anterior and middle cerebral arteries, including distal segments. Supernumerary anterior cerebral artery arising from LEFT A1-2 junction. No large vessel occlusion, definite high-grade stenosis, abnormal luminal irregularity, aneurysm. Posterior circulation: LEFT vertebral artery is dominant. Basilar artery is patent, with normal flow related enhancement of the main branch vessels. Normal flow related enhancement of the posterior cerebral arteries. Bilateral posterior communicating arteries present. No large vessel occlusion, definite high-grade stenosis, abnormal luminal irregularity, aneurysm. IMPRESSION: MRI HEAD: No acute intracranial process, specifically no acute ischemia on this mildly motion degraded examination. Negative noncontrast MRI brain for age. MRA HEAD: No acute large vessel occlusion or definite high-grade stenosis on this moderately motion degraded examination. Electronically Signed   By: Elon Alas M.D.   On: 07/13/2015 22:00    Oren Binet, MD  [facility] Pager:[phone]  If 7PM-7AM, please contact night-coverage [email or web address] Password [email or web address] 07/14/2015, 2:42 PM   LOS: 1 day

## 2015-07-14 NOTE — Progress Notes (Signed)
Utilization Review Completed.Donne Anon T12/30/2016

## 2015-07-14 NOTE — Progress Notes (Signed)
Physical Therapy Evaluation Patient Details Name: [name] MRN: [id number] DOB: [birth year] Today's Date: 07/14/2015   History of Present Illness  74 y.o. female with a history of hypertension, diabetes mellitus, atrial fibrillation not on anticoagulation, coronary artery disease, bilateral carotid artery stenosis and CEA, and TIA, brought to the emergency room and code stroke status following acute onset of speech changes. Patient reportedly was repeating herself repeating others, and at times was nonsensical. MRI negative for stroke    Clinical Impression  Pt admitted with above dignosis. Pt currently with functional limitations due to the deficits listed below (see PT Problem List), which are all impacted by her continued AMS. Daughter present and discussed d/c options. She feels if she improves cognitively, they can manage to provide 24 hr support. However if cognition does not improve, they realize they may need to consider SNF. Pt will benefit from skilled PT to increase their independence and safety with mobility to allow discharge to the venue listed below.       Follow Up Recommendations Home health PT;Supervision/Assistance - 24 hour (24 hour due to decr cognition)    Equipment Recommendations  None recommended by PT    Recommendations for Other Services OT consult;Speech consult     Precautions / Restrictions Precautions Precautions: Fall Precaution Comments: reports she falls all the time inside home (daughter agreed "gets off balance")      Mobility  Bed Mobility Overal bed mobility: Modified Independent             General bed mobility comments: HOB flat, no rail; incr time, effort and rquired repeated cues  Transfers Overall transfer level: Needs assistance Equipment used: Rolling walker (2 wheeled);None Transfers: Sit to/from Stand Sit to Stand: Mod assist         General transfer comment: x3 with progression from mod assist to min; required max  cues and repetition to safely stand with RW  Ambulation/Gait Ambulation/Gait assistance: Min assist Ambulation Distance (Feet): 65 Feet (15, 15) Assistive device: Rolling walker (2 wheeled) Gait Pattern/deviations: Step-through pattern;Decreased stride length;Trunk flexed   Gait velocity interpretation: Below normal speed for age/gender    Stairs            Wheelchair Mobility    Modified Rankin (Stroke Patients Only)       Balance Overall balance assessment: Needs assistance;History of Falls Sitting-balance support: No upper extremity supported;Feet unsupported Sitting balance-Leahy Scale: Fair     Standing balance support: No upper extremity supported Standing balance-Leahy Scale: Poor Standing balance comment: posterior bias initially                             Pertinent Vitals/Pain Pain Assessment: No/denies pain    Home Living Family/patient expects to be discharged to:: Private residence Living Arrangements: Spouse/significant other Available Help at Discharge: Family;Available 24 hours/day (spouse; dtr and grandtr work and will also assist) Type of Home: House Home Access: Level entry     Home Layout: One [place]: [place] - 4 wheels;Cane - single point      Prior Function Level of Independence: Needs assistance   Gait / Transfers Assistance Needed: frequent falls due to imbalance; only uses cane when goes out; uses no device inside           Hand Dominance        Extremity/Trunk Assessment   Upper Extremity Assessment: RUE deficits/detail;Defer to OT evaluation RUE Deficits / Details: limited shoulder  Lower Extremity Assessment: Generalized weakness (Rt clonus when seated with ball of foot stimulated)      Cervical / Trunk Assessment: Kyphotic (slight)  Communication   Communication: HOH  Cognition Arousal/Alertness: Awake/alert Behavior During Therapy: WFL for tasks assessed/performed Overall  Cognitive Status: Impaired/Different from baseline Area of Impairment: Attention;Following commands;Safety/judgement;Awareness   Current Attention Level: Sustained Memory: Decreased short-term memory Following Commands: Follows one step commands inconsistently (due to decr attention and slow processing) Safety/Judgement: Decreased awareness of safety;Decreased awareness of deficits     General Comments: repeating phrases as they are said to her; slow to recognize daughter; could not state that she would call 911 in case of a fire (even with leading prompts/questions)    General Comments General comments (skin integrity, edema, etc.): Daughter present    Exercises        Assessment/Plan    PT Assessment Patient needs continued PT services  PT Diagnosis Difficulty walking;Altered mental status   PT Problem List Decreased balance;Decreased mobility;Decreased cognition;Decreased knowledge of use of DME;Decreased safety awareness;Impaired tone;Obesity  PT Treatment Interventions Gait training;DME instruction;Functional mobility training;Therapeutic activities;Therapeutic exercise;Balance training;Cognitive remediation;Patient/family education   PT Goals (Current goals can be found in the Care Plan section) Acute Rehab PT Goals Patient Stated Goal: unable; daughter wants pt's confusion to clear for d/c home as PTA PT Goal Formulation: With family Time For Goal Achievement: 07/21/15 Potential to Achieve Goals: Good    Frequency Min 3X/week   Barriers to discharge        Co-evaluation               End of Session Equipment Utilized During Treatment: Gait belt Activity Tolerance: Patient tolerated treatment well Patient left: in bed;with call bell/phone within reach;with bed alarm set;with family/visitor present (in chair position; family member asleep) Nurse Communication: Mobility status         Time: 1325-1400 PT Time Calculation (min) (ACUTE ONLY): 35  min   Charges:   PT Evaluation $Initial PT Evaluation Tier I: 1 Procedure PT Treatments $Gait Training: 8-22 mins   PT G Codes:        Riannah Stagner 2015/07/28, 2:43 PM Pager [phone]

## 2015-07-14 NOTE — Progress Notes (Signed)
PT Cancellation Note  Patient Details Name: YUMEKA VALENTINO MRN: [id number] DOB: Aug 02, 1940   Cancelled Treatment:    Reason Eval/Treat Not Completed: Most recent activity order is for bedrest with HOB 30 degrees. Will contact MD to get activity order clarified and proceed with PT evaluation when appropriate.   Valmai Vandenberghe 07/14/2015, 8:16 AM  Pager [phone]

## 2015-07-14 NOTE — Progress Notes (Signed)
ANTIBIOTIC CONSULT NOTE - INITIAL  Pharmacy Consult for Acyclovir Indication: ?Encephalitis   Allergies  Allergen Reactions  . Gabapentin Other (See Comments)    hallucinations    Patient Measurements: Height: 5\' 5"  (165.1 cm) Weight: 186 lb 8 oz (84.596 kg) IBW/kg (Calculated) : 57  Vital Signs: Temp: 98.2 F (36.8 C) (12/30 0858) Temp Source: Axillary (12/30 0858) BP: 146/60 mmHg (12/30 0858) Pulse Rate: 60 (12/30 0858) Intake/Output from previous day:   Intake/Output from this shift:    Labs:  Recent Labs  07/13/15 1630 07/13/15 1645  WBC 11.3*  --   HGB 12.6 13.6  PLT 178  --   CREATININE 2.41* 2.40*   Estimated Creatinine Clearance: 21.7 mL/min (by C-G formula based on Cr of 2.4). No results for input(s): VANCOTROUGH, VANCOPEAK, VANCORANDOM, GENTTROUGH, GENTPEAK, GENTRANDOM, TOBRATROUGH, TOBRAPEAK, TOBRARND, AMIKACINPEAK, AMIKACINTROU, AMIKACIN in the last 72 hours.   Microbiology: Recent Results (from the past 720 hour(s))  Urine culture     Status: None   Collection Time: 07/13/15  6:00 PM  Result Value Ref Range Status   Specimen Description URINE, RANDOM  Final   Special Requests NONE  Final   Culture MULTIPLE SPECIES PRESENT, SUGGEST RECOLLECTION  Final   Report Status 07/14/2015 FINAL  Final    Medical History: Past Medical History  Diagnosis Date  . Anemia   . Glaucoma   . Hypertension   . Blood transfusion   . GERD (gastroesophageal reflux disease)   . Arthritis   . Neuromuscular disorder ([place])     CARPEL TUNNEL  . Atrial fibrillation ([place])   . CAD (coronary artery disease)   . Carotid artery occlusion     Carotid Endartectom,y - left 2009.  Blockage Right being watched by Dr Scot Dock.  Marland Kitchen HOH (hard of hearing)   . Shortness of breath   . Depression   . History of kidney stones     passed  . Carotid stenosis   . Complication of anesthesia     pt. states that she was difficult to wake  . Stroke ([place])     hx of TIA  . Chronic  kidney disease     patient states stage IV  . Cancer ([place])     .  top of head- melonoma  . Dysrhythmia   . History of pneumonia   . Diabetes mellitus without complication ([place])     Medications:  Prescriptions prior to admission  Medication Sig Dispense Refill Last Dose  . allopurinol (ZYLOPRIM) 100 MG tablet Take 200 mg by mouth daily.    07/13/2015 at Unknown time  . amLODipine (NORVASC) 10 MG tablet TAKE ONE TABLET BY MOUTH ONE  TIME DAILY 30 tablet 1 07/13/2015 at Unknown time  . aspirin 325 MG tablet Take 325 mg by mouth daily.   07/13/2015 at Unknown time  . atorvastatin (LIPITOR) 10 MG tablet TAKE ONE TABLET BY MOUTH ONE TIME DAILY (Patient taking differently: Take 10 mg by mouth daily. ) 30 tablet 0 07/13/2015 at Unknown time  . B Complex Vitamins (VITAMIN B COMPLEX PO) Take 1 tablet by mouth daily.   07/13/2015 at Unknown time  . brimonidine (ALPHAGAN P) 0.1 % SOLN Place 1 drop into the left eye 2 (two) times daily.   07/13/2015 at Unknown time  . calcitRIOL (ROCALTROL) 0.25 MCG capsule Take 0.25 mcg by mouth every other day.   07/13/2015 at Unknown time  . Cholecalciferol (VITAMIN D) 2000 UNITS tablet Take 2,000 Units by mouth 2 (  two) times daily.   07/13/2015 at Unknown time  . citalopram (CELEXA) 20 MG tablet Take 20 mg by mouth daily.   07/13/2015 at Unknown time  . fish oil-omega-3 fatty acids 1000 MG capsule Take 2 g by mouth daily.   07/13/2015 at Unknown time  . furosemide (LASIX) 40 MG tablet Take 80 mg by mouth 2 (two) times daily.    07/13/2015 at Unknown time  . HUMALOG 100 UNIT/ML injection Inject 0-12 Units into the skin 3 (three) times daily with meals. Sliding scale   07/13/2015 at Unknown time  . HYDROcodone-acetaminophen (NORCO/VICODIN) 5-325 MG tablet Take 1-2 tablets by mouth every 4 (four) hours as needed for moderate pain. 30 tablet 0 07/13/2015 at Unknown time  . insulin glargine (LANTUS) 100 UNIT/ML injection Inject 20-30 Units into the skin at bedtime.     07/12/2015 at Unknown time  . linagliptin (TRADJENTA) 5 MG TABS tablet Take 1 tablet (5 mg total) by mouth daily with breakfast. 30 tablet 1 07/13/2015 at Unknown time  . losartan (COZAAR) 100 MG tablet Take 1 tablet by mouth daily.  5   . metoprolol (LOPRESSOR) 50 MG tablet Take 50 mg by mouth 2 (two) times daily.   07/13/2015 at 0800  . omeprazole (PRILOSEC) 20 MG capsule Take 20 mg by mouth daily as needed. Acid reflux   07/13/2015 at Unknown time  . timolol (TIMOPTIC) 0.5 % ophthalmic solution Place 1 drop into the left eye daily.   07/13/2015 at Unknown time  . travoprost, benzalkonium, (TRAVATAN) 0.004 % ophthalmic solution Place 1 drop into the left eye at bedtime.   07/13/2015 at Unknown time  . acetaminophen (TYLENOL) 500 MG tablet Take 500 mg by mouth every 6 (six) hours as needed. For pain   Taking  . colchicine 0.6 MG tablet Take 0.6 mg by mouth 2 (two) times daily. gout   Taking  . oxyCODONE (ROXICODONE) 5 MG immediate release tablet Take 1 tablet (5 mg total) by mouth every 6 (six) hours as needed. 20 tablet 0 Taking   Assessment: 15 YOF with who presented with stroke symptoms. MRI head negative for ischemic events. Per neurology, she is more confused this AM. Pharmacy consulted to start IV acyclovir for r/o encephalitis. EEG findings consistent with mild non specific encephalopathy. WBC 11.3 on admission. Afebrile. CrCl ~ 20-25 mL/min  12/29 UCx>> multiple species 12/30 BCx2>>   Goal of Therapy:  Resolution of infection   Plan:  -Start Acyclovir 10 mg/kg IBW Q 12 hours. Dose adjusted for renal fx -Monitor for clinical improvement and changes in renal fx   [name], PharmD., BCPS Clinical Pharmacist Pager [phone]

## 2015-07-14 NOTE — Progress Notes (Signed)
Pt refused to let lab draw blood from her this morning.Marland KitchenMarland KitchenMarland KitchenShe refused lab tech and then I approached her and she refused me as well. Called and notified husband.  Rescheduled lab work till [place] when family will be present at bedside.

## 2015-07-14 NOTE — Procedures (Signed)
EEG report.  Brief clinical history: 74 y.o. female with a history of hypertension, diabetes mellitus, atrial fibrillation not on anticoagulation, coronary artery disease, bilateral carotid artery stenosis and CEA, and TIA, brought to the emergency room and code stroke status following acute onset of speech output changes. Patient reportedly was repeating herself repeating others, and at times at speech content that was nonsensical.   Technique: this is a 17 channel routine scalp EEG performed at the bedside with bipolar and monopolar montages arranged in accordance to the international 10/20 system of electrode placement. One channel was dedicated to EKG recording.  The study was performed during wakefulness, drowsiness, and stage 2 sleep. No activating procedures performed.   Description: patient is in and out sleep throughout the study, with the wakeful state demonstrating generalized, continuous and reactive 6 Hz activity that does not follow an ictal pattern at any moment and has no intermixed epileptiform discharges. Sleep reveals normal architecture. By the end of the study there is prominent presence of muscle artifacts. EKG showed sinus rhythm.  Impression: this is an abnormal awake and asleep EEG that demonstrated findings consistent with a mild non specific encephalopathy. No electrographic seizures seen. Clinical correlation is advised.   Dorian Pod, MD [facility]

## 2015-07-14 NOTE — Evaluation (Signed)
Speech Language Pathology Evaluation Patient Details Name: [name] MRN: [id number] DOB: 06-22-1941 Today's Date: 07/14/2015 Time: [id number] SLP Time Calculation (min) (ACUTE ONLY): 45 min  Problem List:  Patient Active Problem List   Diagnosis Date Noted  . Acute delirium   . Acute encephalopathy   . Chronic kidney disease (CKD), stage V ([place]) 07/13/2015  . Stroke (cerebrum) ([place]) 07/13/2015  . Hypertension   . Glaucoma   . GERD (gastroesophageal reflux disease)   . Atrial fibrillation ([place])   . CAD (coronary artery disease)   . Depression   . Carotid stenosis   . Stroke ([place])   . Diabetes mellitus without complication ([place])   . Gastroesophageal reflux disease with esophagitis   . Carotid artery stenosis 04/21/2015  . Coronary arteriosclerosis 06/08/2014  . Essential hypertension 06/08/2014  . Hyperlipidemia 06/08/2014  . End stage renal disease ([place]) 05/18/2014  . Occlusion and stenosis of carotid artery without mention of cerebral infarction 01/01/2012   Past Medical History:  Past Medical History  Diagnosis Date  . Anemia   . Glaucoma   . Hypertension   . Blood transfusion   . GERD (gastroesophageal reflux disease)   . Arthritis   . Neuromuscular disorder ([place])     CARPEL TUNNEL  . Atrial fibrillation ([place])   . CAD (coronary artery disease)   . Carotid artery occlusion     Carotid Endartectom,y - left 2009.  Blockage Right being watched by Dr Scot Dock.  Marland Kitchen HOH (hard of hearing)   . Shortness of breath   . Depression   . History of kidney stones     passed  . Carotid stenosis   . Complication of anesthesia     pt. states that she was difficult to wake  . Stroke ([place])     hx of TIA  . 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 Cancer ([place])     .  top of head- melonoma  . Dysrhythmia   . History of pneumonia   . Diabetes mellitus without complication [facility])    Past Surgical History:  Past Surgical History  Procedure Laterality Date  .  Carpel tunnel    . Neck surgery    . Esophagogastroduodenoscopy  07/25/2011    Procedure: ESOPHAGOGASTRODUODENOSCOPY (EGD);  Surgeon: Winfield Cunas., MD;  Location: [facility] ENDOSCOPY;  Service: Endoscopy;  Laterality: N/A;  . Colonoscopy  07/25/2011    Procedure: COLONOSCOPY;  Surgeon: Winfield Cunas., MD;  Location: [facility] ENDOSCOPY;  Service: Endoscopy;  Laterality: N/A;  . Coronary artery bypass graft  07/29/2011    Procedure: CORONARY ARTERY BYPASS GRAFTING (CABG);  Surgeon: Gaye Pollack, MD;  Location: [place];  Service: Open Heart Surgery;  Laterality: N/A;  . Carotid endarterectomy Left 2009     CEA  . Av fistula placement Left 05/31/2014    Procedure: Creation of Left Arm arteriovenous brachiocephalic Fistula;  Surgeon: Angelia Mould, MD;  Location: [place];  Service: Vascular;  Laterality: Left;  . Fistulogram Left 08/29/2014    Procedure: FISTULOGRAM;  Surgeon: Angelia Mould, MD;  Location: [facility] CATH LAB;  Service: Cardiovascular;  Laterality: Left;  . Av fistula placement Left 09/01/2014    Procedure: INSERTION OF ARTERIOVENOUS (AV) GORE-TEX GRAFT LEFT UPPER ARM USING  4-7 MM X 45 CM SRTETCH GORETEX GRAFT;  Surgeon: Angelia Mould, MD;  Location: [place];  Service: Vascular;  Laterality: Left;  . Cardiac catheterization    .  Tonsillectomy    . Eye surgery    . Endarterectomy Right 04/21/2015    Procedure: ENDARTERECTOMY CAROTID;  Surgeon: Angelia Mould, MD;  Location: [place];  Service: Vascular;  Laterality: Right;   HPI:  74 y.o. female with a history of hypertension, diabetes mellitus, atrial fibrillation not on anticoagulation, coronary artery disease, bilateral carotid artery stenosis and CEA, and TIA, brought to the emergency room and code stroke status following acute onset of speech changes. Patient reportedly was repeating herself repeating others, and at times was nonsensical. MRI negative for stroke    Assessment / Plan / Recommendation Clinical  Impression  Patient presents with moderate impairments of cognitive and linguistic functioning, as well as expressive aphasia, however patient's MRI was negative for acute CVA. Patient's cognitive impairments as characterized by decreased self-monitoring and correction, delays in processing for verbal problem solving, decreased sustained attention and disorganized thought with poor topic maintenance. Patient's language impairments are characterized by impairment in responsive naming (describing object function), inability to name months of year without significant prompting and cues, and delays in processing when naming family members. Patient misused pronouns and would talk about family members, who were present in the room, as if they were not there, "I like Tiffany, she's a good girl" (Tiffany was sitting right in front of her. Patient should have some resolution in her symptoms, as she has not been diagnosed with a CVA, however she will benefit from speech-language therapy to maximize safety, awareness, and her ability to effectively communicate her wants and needs.    SLP Assessment  Patient needs continued Speech Lanaguage Pathology Services    Follow Up Recommendations  24 hour supervision/assistance;Home health SLP;Outpatient SLP;Skilled Nursing facility (pending progress, but suspect she will need some form of SLP services at next venue of care)    Frequency and Duration min 3x week  2 weeks      SLP Evaluation Prior Functioning  Cognitive/Linguistic Baseline: Within functional limits Type of Home: House Available Help at Discharge: Family;Available 24 hours/day Education: stated she completed 12 grade   Cognition  Overall Cognitive Status: Impaired/Different from baseline Arousal/Alertness: Awake/alert Orientation Level: Oriented to person;Oriented to place;Disoriented to time;Disoriented to situation Attention: Sustained Sustained Attention: Impaired Sustained Attention  Impairment: Verbal basic;Verbal complex;Functional basic Memory: Impaired Memory Impairment: Retrieval deficit;Decreased recall of new information Awareness: Impaired Awareness Impairment: Emergent impairment Problem Solving: Impaired Problem Solving Impairment: Verbal basic;Verbal complex;Functional basic Executive Function: Sequencing;Organizing;Decision Making;Self Correcting Sequencing: Impaired Sequencing Impairment: Verbal complex;Functional basic Organizing: Impaired Organizing Impairment: Verbal complex Decision Making: Impaired Decision Making Impairment: Verbal complex;Functional basic Self Correcting: Impaired Self Correcting Impairment: Verbal complex Behaviors: Impulsive;Perseveration;Lability Safety/Judgment: Impaired    Comprehension  Auditory Comprehension Overall Auditory Comprehension: Impaired Yes/No Questions: Within Functional Limits Commands: Impaired One Step Basic Commands: 75-100% accurate Two Step Basic Commands: 50-74% accurate Conversation: Simple Interfering Components: Attention;Processing speed EffectiveTechniques: Extra processing time;Increased volume;Slowed speech;Stressing words;Repetition;Pausing    Expression Expression Primary Mode of Expression: Verbal Verbal Expression Overall Verbal Expression: Impaired Initiation: No impairment Automatic Speech: Name;Social Response Level of Generative/Spontaneous Verbalization: Sentence Naming: Impairment Responsive: 51-75% accurate Confrontation: Impaired Verbal Errors: Perseveration;Aware of errors;Other (comment) (aware of errors but only able to correct with clinician cues) Pragmatics: Impairment Impairments: Abnormal affect;Topic maintenance Interfering Components: Attention Effective Techniques: Open ended questions;Sentence completion Non-Verbal Means of Communication: Not applicable   Oral / Motor Oral Motor/Sensory Function Overall Oral Motor/Sensory Function: Within functional limits     Dannial Monarch 07/14/2015, 5:52 PM    Jenny Reichmann  Earline Mayotte, MA, CCC-SLP 07/14/2015 5:52 PM

## 2015-07-14 NOTE — Progress Notes (Signed)
STROKE TEAM PROGRESS NOTE   HISTORY [name] is an 74 y.o. female with a history of hypertension, diabetes mellitus, atrial fibrillation not on anticoagulation, coronary artery disease, bilateral carotid artery stenosis and CEA, and TIA, brought to the emergency room and code stroke status following acute onset of speech output changes. Patient reportedly was repeating herself repeating others, and at times at speech content that was nonsensical. She also had right upper extremity weakness in route to the ED. She has been taking aspirin daily. CT scan of her head showed no acute intracranial abnormality. NIH stroke score was 3. Right upper extremity was essentially resolved and drift was felt to be due to pain and guarding involving right shoulder. He tended to repeat it was said to her as well as her rate with verbal responses to questions. She was LKW 1330 on 07/13/2015. Patient was not administered TPA secondary to minimal deficits. She was admitted for further evaluation and treatment.   SUBJECTIVE (INTERVAL HISTORY) Her husband, daughter and granddaughther are at the bedside. Overall she feels her condition is unchanged from yesterday. They feel she is still confused this am. Pt is also more sleepy and drowsy, hard to wake up.  As per family, this is rather acute onset. Pt was doing well the day before yesterday and yesterday stated to have confusion and repeating questions and sentences and confused. No recent new medication or food. Renal function actually improved this time comparing with before. MRI and MRA negative for stroke. Need TEE and consider LP.    OBJECTIVE Temp:  [97.3 F (36.3 C)-98.9 F (37.2 C)] 98.2 F (36.8 C) (12/30 0858) Pulse Rate:  [60-97] 60 (12/30 0858) Cardiac Rhythm:  [-] Normal sinus rhythm;Bundle branch block (12/30 0700) Resp:  [12-29] 17 (12/30 0858) BP: (113-170)/(47-79) 146/60 mmHg (12/30 0858) SpO2:  [91 %-100 %] 98 % (12/30 0858) Weight:  [84.596  kg (186 lb 8 oz)-87.5 kg (192 lb 14.4 oz)] 84.596 kg (186 lb 8 oz) (12/29 2156)  CBC:  Recent Labs Lab 07/13/15 1630 07/13/15 1645  WBC 11.3*  --   NEUTROABS 8.1*  --   HGB 12.6 13.6  HCT 37.7 40.0  MCV 92.2  --   PLT 178  --     Basic Metabolic Panel:  Recent Labs Lab 07/13/15 1630 07/13/15 1645  NA 142 141  K 3.8 3.8  CL 102 101  CO2 26  --   GLUCOSE 220* 218*  BUN 79* 80*  CREATININE 2.41* 2.40*  CALCIUM 9.1  --     Lipid Panel:    Component Value Date/Time   CHOL 177 07/13/2015 1630   TRIG 280* 07/13/2015 1630   HDL 40* 07/13/2015 1630   CHOLHDL 4.4 07/13/2015 1630   VLDL 56* 07/13/2015 1630   LDLCALC 81 07/13/2015 1630   HgbA1c:  Lab Results  Component Value Date   HGBA1C 6.6* 03/30/2015   Urine Drug Screen:    Component Value Date/Time   LABOPIA NONE DETECTED 07/13/2015 1800   COCAINSCRNUR NONE DETECTED 07/13/2015 1800   LABBENZ NONE DETECTED 07/13/2015 1800   AMPHETMU NONE DETECTED 07/13/2015 1800   THCU NONE DETECTED 07/13/2015 1800   LABBARB NONE DETECTED 07/13/2015 1800      IMAGING  Dg Chest 2 View 07/13/2015 Stable mild cardiomegaly without overt pulmonary edema. No active pulmonary disease.   Dg Shoulder Right 07/13/2015 Negative.   Ct Head Wo Contrast 07/13/2015 No acute intracranial abnormality noted.   MRI HEAD 07/13/2015 : No acute  intracranial process, specifically no acute ischemia on this mildly motion degraded examination. Negative noncontrast MRI brain for age.   MRA HEAD 07/13/2015 No acute large vessel occlusion or definite high-grade stenosis on this moderately motion degraded examination.   CUS - pending  EEG - pending   PHYSICAL EXAM  Temp:  [97.3 F (36.3 C)-98.9 F (37.2 C)] 98.2 F (36.8 C) (12/30 0858) Pulse Rate:  [60-97] 60 (12/30 0858) Resp:  [12-29] 17 (12/30 0858) BP: (113-168)/(47-79) 146/60 mmHg (12/30 0858) SpO2:  [91 %-100 %] 98 % (12/30 0858) Weight:  [186 lb 8 oz (84.596 kg)] 186  lb 8 oz (84.596 kg) (12/29 2156)  General - obesity, well developed, able to open eyes on voice but not answer questions appropriately and did not follow simple commands.  Ophthalmologic - Fundi not visualized due to noncooperation.  Cardiovascular - Regular rate and rhythm with no murmur.  Neck - supple, no meningismus   Neuro - sleepy drowsy but able to open eyes on voice and pain. Limited language output, but not answer questions appropriately, did not follow simple commands. Able to repeat some sentences but dysarthric. PERRL, EOMI, facial symmetrical, tongue in middle. Move all extremities symmetrically, DTR 1+, no babinski. Sensation, coordination and gait not tested.   ASSESSMENT/PLAN [name] is a 74 y.o. female with history of hypertension, diabetes mellitus, atrial fibrillation not on anticoagulation, coronary artery disease, bilateral carotid artery stenosis and CEA, and TIA presenting with abnormal speech output. She did not receive IV t-PA due to minimal deficits.    Encephalopathy  NO acute stroke  Resultant  Lethargic but confused state  MRI  No acute stroke  MRA  No large vessel stenosis  Carotid Doppler  pending   2D Echo  pending   EEG pending   LDL 81  HgbA1c 6.6 in Sept  Heparin 5000 units sq tid for VTE prophylaxis  Diet heart healthy/carb modified Room service appropriate?: Yes; Fluid consistency:: Thin  aspirin 325 mg daily prior to admission, now on aspirin 325 mg daily.  May consider LP to rule out meningitis, right now pt not cooperative.  Ongoing aggressive stroke risk factor management  Therapy recommendations:  pending   Disposition:  pending   Acute delirium  UA negative for UTI  CXR negative for pneumonia  Cre 2.40, improved from previous  BUN 80, improved from previous  LFT WNL  Check Ammonia level  ?? Atrial Fibrillation  hx of PUD per daughter  No mention of AFIB in his last cardiology note  Family denies  hx of afib  B/l carotid stenosis s/p CEA  Left CEA 7-8 years ago  Right CEA 04/2015  CUS pending     Hypertension  Stable  Hyperlipidemia  Home meds:  lipitor 10, resumed in hospital  LDL 81  Continue statin at discharge  Diabetes  HgbA1c 6.6 in Sept, at goal < 7.0  Glucose stable during admission  Other Stroke Risk Factors  Advanced age  Obesity, Body mass index is 31.04 kg/(m^2).   Hx stroke/TIA  2009 d/t carotid stenosis  Coronary artery disease d/p CABG  Other Active Problems  Recent URI per family, lasted ~10 days, took alkaseltzer plus cold gels  GERD  CKD stage V  Hospital day # 1  Rosalin Hawking, MD PhD Stroke Neurology 07/14/2015 5:02 PM     To contact Stroke Continuity provider, please refer to [email or web address] After hours, contact General Neurology

## 2015-07-14 NOTE — Progress Notes (Signed)
  Echocardiogram 2D Echocardiogram has been performed.  [name] 07/14/2015, 5:28 PM

## 2015-07-15 ENCOUNTER — Inpatient Hospital Stay (HOSPITAL_COMMUNITY)
Admit: 2015-07-15 | Discharge: 2015-07-15 | Disposition: A | Discharge status: Home / Self Care | Payer: Medicare Other | Attending: Internal Medicine | Admitting: Internal Medicine

## 2015-07-15 DIAGNOSIS — R7881 Bacteremia: Secondary | ICD-10-CM

## 2015-07-15 DIAGNOSIS — G9340 Encephalopathy, unspecified: Principal | ICD-10-CM

## 2015-07-15 DIAGNOSIS — E119 Type 2 diabetes mellitus without complications: Secondary | ICD-10-CM

## 2015-07-15 LAB — GLUCOSE, CAPILLARY
GLUCOSE-CAPILLARY: 205 mg/dL — AB (ref 65–99)
Glucose-Capillary: 156 mg/dL — ABNORMAL HIGH (ref 65–99)
Glucose-Capillary: 175 mg/dL — ABNORMAL HIGH (ref 65–99)

## 2015-07-15 LAB — HEMOGLOBIN A1C
Hgb A1c MFr Bld: 7 % — ABNORMAL HIGH (ref 4.8–5.6)
Mean Plasma Glucose: 154 mg/dL

## 2015-07-15 LAB — HIV ANTIBODY (ROUTINE TESTING W REFLEX): HIV SCREEN 4TH GENERATION: NONREACTIVE

## 2015-07-15 LAB — THYROID PEROXIDASE ANTIBODY: THYROID PEROXIDASE ANTIBODY: 21 [IU]/mL (ref 0–34)

## 2015-07-15 LAB — RPR: RPR Ser Ql: NONREACTIVE

## 2015-07-15 MED ORDER — SODIUM CHLORIDE 0.9 % IV SOLN
1500.0000 mg | Freq: Once | INTRAVENOUS | Status: AC
  Administered 2015-07-15: 1500 mg via INTRAVENOUS
  Filled 2015-07-15: qty 1500

## 2015-07-15 MED ORDER — VANCOMYCIN HCL 10 G IV SOLR: 1250.0000 mg | INTRAVENOUS | Status: DC

## 2015-07-15 MED ORDER — DEXTROSE 5 % IV SOLN
10.0000 mg/kg | INTRAVENOUS | Status: DC
  Filled 2015-07-15 (×2): qty 11.4

## 2015-07-15 NOTE — Progress Notes (Signed)
ANTIBIOTIC CONSULT NOTE - INITIAL  Pharmacy Consult for Vancomycin Indication: r/o bacteremia   Patient Measurements: Height: 5\' 5"  (165.1 cm) Weight: 186 lb 8 oz (84.596 kg) IBW/kg (Calculated) : 57  Vital Signs: Temp: 98.2 F (36.8 C) (12/31 0919) Temp Source: Oral (12/31 0919) BP: 130/48 mmHg (12/31 0919) Pulse Rate: 63 (12/31 0919)  Labs:  Recent Labs  07/13/15 1630 07/13/15 1645 07/14/15 1600  WBC 11.3*  --  10.2  HGB 12.6 13.6 11.0*  PLT 178  --  161  CREATININE 2.41* 2.40* 2.60*   Antimicrobials: 12/31 vancomycin >>   Microbiology: 12/29 UCx>> multiple species  12/30 BCx2>> GPC in pairs in 1/4  12/31 BCx: px  Assessment: 62 yoF who presented who stroke like symptoms. BCx's obtained on admission sx for GPC in pairs in 1/4. Repeat BCx's ordered. Pharmacy to dose vancomycin to r/o active infection.  Goal of Therapy:  Vancomycin trough level 15-20 mcg/ml  Plan:  1. Vancomycin 1500 mg x 1 after repeat BCx's obtained followed by 1250 mg Q 48 hours starting on 07/16/14 2. If continued level in 3-5 days 3. Await results of repeat cx data; real vs contaminant    Vincenza Hews, PharmD, BCPS 07/15/2015, 9:43 AM Pager: [phone]

## 2015-07-15 NOTE — Procedures (Signed)
ELECTROENCEPHALOGRAM REPORT  Patient: [name]       Room #: [id number] EEG No. ID: [id number] Age: 74 y.o.        Sex: female Referring Physician: Nena Alexander Report Date:  07/15/2015        Interpreting Physician: Anthony Sar  History: [name] is an 74 y.o. female admitted with transient speech abnormality and right-sided weakness as well as mild confusion. EEG on 07/14/2015 showed mild nonspecific generalized slowing. No evidence of seizure activity was recorded.  Indications for study:  Assess severity of encephalopathy; rare with study on 07/14/2015.  Technique: This is an 18 channel routine scalp EEG performed at the bedside with bipolar and monopolar montages arranged in accordance to the international 10/20 system of electrode placement.   Description: EEG recording was performed during wakefulness and drowsiness. Predominant background activity during wakefulness consisted of low amplitude diffuse delta activity with superimposed 6-7 Hz theta activity diffusely but most prominently posterior head regions. Photic stimulation was not performed. No epileptiform discharges were recorded. During drowsiness there was increased slowing of background activity with more prominent and higher amplitude delta activity. Stage II of sleep was not achieved.  Interpretation: This EEG is abnormal with mild nonspecific generalized slowing of cerebral activity which can be seen with metabolic as well as toxic and degenerative encephalopathies. No evidence of an epileptic disorder was demonstrated. Compared to previous study on 07/14/2015, there is slight improvement in background cerebral activity.   Rush Farmer M.D. [facility] [phone]

## 2015-07-15 NOTE — Progress Notes (Signed)
Bedside EEG completed, results pending. 

## 2015-07-15 NOTE — Progress Notes (Signed)
PATIENT DETAILS Name: [name] Age: 74 y.o. Sex: female Date of Birth: September 13, 1940 Admit Date: 07/13/2015 Admitting Physician Ivor Costa, MD [id number] NEVILL, MD  Subjective: Appears much more awake and alert today  Assessment/Plan: Principal Problem: Acute encephalopathy: Likely secondary to gram-positive bacteremia. Doubt herpes encephalitis given blood cultures now positive-hence will discontinue acyclovir.  MRI brain negative for CVA, EEG negative for seizure activity. Seems to have improved-we will await arrival of family to see if she is back to her usual baseline.    Active Problems: Gram-positive bacteremia: One set of blood cultures positive for gram-positive cocci in chains, start vancomycin, await final results. Likely cause of encephalopathy.  ? Atrial fibrillation:I do not see any documentation of atrial fibrillation and a prior notes oriented EKGs. Spoke with patient's primary cardiologist -Dr Aris Georgia suspects patient may have had transient A. fib after CABG a few years ago. If needed-he suggested outpatient 30 day monitor. Continue to monitor in telemetry.   CKD stage IV: Creatinine close to usual baseline,  follow electrolytes.  CAD: s/p of CABG 2013. No CP-continue aspirin, Lipitor and metoprolol  Essential hypertension: Controlled-continue metoprolol. Resume amlodipine, losartan when able.  Dyslipidemia: Continue statin   History of carotid stenosis:status post bilateral carotid endarterectomy-continue aspirin and statin.   GERD: Continue PPI  Type 2 diabetes: CBGs stable, continue Lantus 20 units, and SSI  Gout: Stable without any evidence of flare-continue colchicine  Disposition: Remain inpatient  Antimicrobial agents  See below  Anti-infectives    Start     Dose/Rate Route Frequency Ordered Stop   07/17/15 0800  vancomycin (VANCOCIN) 1,250 mg in sodium chloride 0.9 % 250 mL IVPB     1,250 mg 166.7 mL/hr over 90  Minutes Intravenous Every 48 hours 07/15/15 0944     07/15/15 1000  acyclovir (ZOVIRAX) 570 mg in dextrose 5 % 100 mL IVPB     10 mg/kg  57 kg (Ideal) 111.4 mL/hr over 60 Minutes Intravenous Every 24 hours 07/15/15 0948     07/15/15 0945  vancomycin (VANCOCIN) 1,500 mg in sodium chloride 0.9 % 500 mL IVPB     1,500 mg 250 mL/hr over 120 Minutes Intravenous  Once 07/15/15 [id number]        DVT Prophylaxis: Prophylactic Heparin   Code Status: Full code   Family Communication None at bedside  Procedures: None  CONSULTS:  neurology  Time spent 30 minutes-Greater than 50% of this time was spent in counseling, explanation of diagnosis, planning of further management, and coordination of care.  MEDICATIONS: Scheduled Meds: . acyclovir  10 mg/kg (Ideal) Intravenous Q24H  . allopurinol  200 mg Oral Daily  . aspirin  325 mg Oral Daily  . atorvastatin  10 mg Oral Daily  . B-complex with vitamin C  1 tablet Oral Daily  . brimonidine  1 drop Left Eye BID  . calcitRIOL  0.25 mcg Oral QODAY  . cholecalciferol  2,000 Units Oral BID  . citalopram  20 mg Oral Daily  . colchicine  0.6 mg Oral BID  . heparin  5,000 Units Subcutaneous 3 times per day  . insulin aspart  0-9 Units Subcutaneous TID WC  . insulin glargine  20 Units Subcutaneous QHS  . metoprolol  50 mg Oral BID  . omega-3 acid ethyl esters  2 g Oral Daily  . pantoprazole  40 mg Oral Daily  . timolol  1 drop Left Eye  Daily  . Travoprost (BAK Free)  1 drop Left Eye QHS  . [START ON 07/17/2015] vancomycin  1,250 mg Intravenous Q48H  . vancomycin  1,500 mg Intravenous Once   Continuous Infusions:  PRN Meds:.acetaminophen, HYDROcodone-acetaminophen, senna-docusate    PHYSICAL EXAM: Vital signs in last 24 hours: Filed Vitals:   07/15/15 0058 07/15/15 0548 07/15/15 0919 07/15/15 1330  BP: 132/60 134/60 130/48 138/50  Pulse: 80 83 63 58  Temp: 98.1 F (36.7 C) 97.7 F (36.5 C) 98.2 F (36.8 C) 97.7 F (36.5 C)  TempSrc:  Oral Oral Oral Oral  Resp: 20 20 18 18   Height:      Weight:      SpO2: 94% 96% 95% 99%    Weight change:  Filed Weights   07/13/15 1643 07/13/15 2156  Weight: 87.5 kg (192 lb 14.4 oz) 84.596 kg (186 lb 8 oz)   Body mass index is 31.04 kg/(m^2).   Gen Exam: Awake and alert Neck: Supple, No JVD.   Chest: B/L Clear.   CVS: S1 S2 Regular, no murmurs.  Abdomen: soft, BS +, non tender, non distended.  Extremities: no edema, lower extremities warm to touch. Neurologic: Non Focal.   Skin: No Rash.   Wounds: N/A.    Intake/Output from previous day:  Intake/Output Summary (Last 24 hours) at 07/15/15 1403 Last data filed at 07/15/15 1021  Gross per 24 hour  Intake    240 ml  Output      0 ml  Net    240 ml     LAB RESULTS: CBC  Recent Labs Lab 07/13/15 1630 07/13/15 1645 07/14/15 1600  WBC 11.3*  --  10.2  HGB 12.6 13.6 11.0*  HCT 37.7 40.0 33.8*  PLT 178  --  161  MCV 92.2  --  94.4  MCH 30.8  --  30.7  MCHC 33.4  --  32.5  RDW 15.0  --  15.1  LYMPHSABS 2.4  --   --   MONOABS 0.7  --   --   EOSABS 0.2  --   --   BASOSABS 0.0  --   --     Chemistries   Recent Labs Lab 07/13/15 1630 07/13/15 1645 07/14/15 1600  NA 142 141 141  K 3.8 3.8 3.9  CL 102 101 102  CO2 26  --  26  GLUCOSE 220* 218* 177*  BUN 79* 80* 82*  CREATININE 2.41* 2.40* 2.60*  CALCIUM 9.1  --  9.0    CBG:  Recent Labs Lab 07/14/15 0631 07/14/15 1216 07/14/15 1647 07/14/15 2058 07/15/15 1328  GLUCAP 103* 119* 170* 204* 156*    GFR Estimated Creatinine Clearance: 20.1 mL/min (by C-G formula based on Cr of 2.6).  Coagulation profile  Recent Labs Lab 07/13/15 1630  INR 1.01    Cardiac Enzymes No results for input(s): CKMB, TROPONINI, MYOGLOBIN in the last 168 hours.  Invalid input(s): CK  Invalid input(s): POCBNP No results for input(s): DDIMER in the last 72 hours.  Recent Labs  07/13/15 1630  HGBA1C 7.0*    Recent Labs  07/13/15 1630  CHOL 177  HDL  40*  LDLCALC 81  TRIG 280*  CHOLHDL 4.4    Recent Labs  07/14/15 1600  TSH 2.039    Recent Labs  07/14/15 1600  VITAMINB12 4206*   No results for input(s): LIPASE, AMYLASE in the last 72 hours.  Urine Studies No results for input(s): UHGB, CRYS in the last 72 hours.  Invalid input(s): UACOL, UAPR, USPG, UPH, UTP, UGL, UKET, UBIL, UNIT, UROB, ULEU, UEPI, UWBC, URBC, UBAC, CAST, UCOM, BILUA  MICROBIOLOGY: Recent Results (from the past 240 hour(s))  Urine culture     Status: None   Collection Time: 07/13/15  6:00 PM  Result Value Ref Range Status   Specimen Description URINE, RANDOM  Final   Special Requests NONE  Final   Culture MULTIPLE SPECIES PRESENT, SUGGEST RECOLLECTION  Final   Report Status 07/14/2015 FINAL  Final  Culture, blood (Routine X 2) w Reflex to ID Panel     Status: None (Preliminary result)   Collection Time: 07/14/15  4:00 PM  Result Value Ref Range Status   Specimen Description BLOOD RIGHT ANTECUBITAL  Final   Special Requests BOTTLES DRAWN AEROBIC AND ANAEROBIC 10CC  Final   Culture  Setup Time   Final    GRAM POSITIVE COCCI IN PAIRS AEROBIC BOTTLE ONLY CRITICAL RESULT CALLED TO, READ BACK BY AND VERIFIED WITH: [facility] AT [id number] 07/15/15 BY L BENFIELD    Culture GRAM POSITIVE COCCI  Final   Report Status PENDING  Incomplete  Culture, blood (Routine X 2) w Reflex to ID Panel     Status: None (Preliminary result)   Collection Time: 07/14/15  4:25 PM  Result Value Ref Range Status   Specimen Description BLOOD RIGHT ANTECUBITAL  Final   Special Requests BOTTLES DRAWN AEROBIC AND ANAEROBIC 10CC  Final   Culture NO GROWTH < 24 HOURS  Final   Report Status PENDING  Incomplete    RADIOLOGY STUDIES/RESULTS: Dg Chest 2 View  07/13/2015  CLINICAL DATA:  Chest pain. EXAM: CHEST  2 VIEW COMPARISON:  05/31/2014 chest radiograph. FINDINGS: Median sternotomy wires are aligned and intact. Stable cardiomediastinal silhouette with mild cardiomegaly. No  pneumothorax. No pleural effusion. Clear lungs, with no focal lung consolidation and no pulmonary edema. IMPRESSION: Stable mild cardiomegaly without overt pulmonary edema. No active pulmonary disease. Electronically Signed   By: Ilona Sorrel M.D.   On: 07/13/2015 18:59   Dg Shoulder Right  07/13/2015  CLINICAL DATA:  74 year old female with right shoulder pain.  The EXAM: RIGHT SHOULDER - 2+ VIEW COMPARISON:  None. FINDINGS: No acute fracture or dislocation. A 5 mm sclerotic lesion in the proximal aspect of the humeral diaphysis likely represents a bone island. The soft tissues appear unremarkable with IMPRESSION: Negative. Electronically Signed   By: Anner Crete M.D.   On: 07/13/2015 18:59   Ct Head Wo Contrast  07/13/2015  CLINICAL DATA:  Slurred speech and right arm weakness, initial encounter EXAM: CT HEAD WITHOUT CONTRAST TECHNIQUE: Contiguous axial images were obtained from the base of the skull through the vertex without intravenous contrast. COMPARISON:  None. FINDINGS: Bony calvarium is intact. No gross soft tissue abnormality is noted. Some mucosal thickening is noted within the right sphenoid sinus. Diffuse basal ganglia calcifications are noted. No findings to suggest acute hemorrhage, acute infarction or space-occupying mass lesion are noted. Mild atrophic changes are noted. IMPRESSION: No acute intracranial abnormality noted. Critical Value/emergent results were called by telephone at the time of interpretation on 07/13/2015 at 4:43 pm to Dr. Nicole Kindred, who verbally acknowledged these results. Electronically Signed   By: Inez Catalina M.D.   On: 07/13/2015 16:46   Mr Brain Wo Contrast  07/13/2015  CLINICAL DATA:  New onset speech changes and RIGHT-sided weakness. History of hypertension, diabetes, atrial fibrillation, carotid stenosis and carotid endarterectomy. EXAM: MRI HEAD WITHOUT CONTRAST MRA HEAD WITHOUT CONTRAST TECHNIQUE:  Multiplanar, multiecho pulse sequences of the brain and  surrounding structures were obtained without intravenous contrast. Angiographic images of the head were obtained using MRA technique without contrast. COMPARISON:  CT head July 13, 2015 at 1634 hours. FINDINGS: MRI HEAD FINDINGS Multiple sequences are mildly motion degraded. The ventricles and sulci are normal for patient's age. No abnormal parenchymal signal, mass lesions, mass effect. No reduced diffusion to suggest acute ischemia. Nonspecific punctate focus of susceptibility artifact LEFT cerebellum, RIGHT body of the corpus callosum. No abnormal extra-axial fluid collections. No extra-axial masses though, contrast enhanced sequences would be more sensitive. Normal major intracranial vascular flow voids seen at the skull base. Status post bilateral ocular lens implants, orbital contents are otherwise unremarkable. No abnormal sellar expansion. No suspicious calvarial bone marrow signal. Craniocervical junction maintained. Pan paranasal sinusitis, severe RIGHT sphenoid sinusitis. The mastoid air cells are well aerated. MRA HEAD FINDINGS Moderately motion degraded examination. Anterior circulation: Flow related enhancement of the included cervical, petrous, cavernous and supraclinoid internal carotid arteries. Mild stenosis suspected bilateral supraclinoid internal carotid arteries in RIGHT cavernous carotid artery though motion degrades sensitivity. Patent anterior communicating artery. Normal flow related enhancement of the anterior and middle cerebral arteries, including distal segments. Supernumerary anterior cerebral artery arising from LEFT A1-2 junction. No large vessel occlusion, definite high-grade stenosis, abnormal luminal irregularity, aneurysm. Posterior circulation: LEFT vertebral artery is dominant. Basilar artery is patent, with normal flow related enhancement of the main branch vessels. Normal flow related enhancement of the posterior cerebral arteries. Bilateral posterior communicating  arteries present. No large vessel occlusion, definite high-grade stenosis, abnormal luminal irregularity, aneurysm. IMPRESSION: MRI HEAD: No acute intracranial process, specifically no acute ischemia on this mildly motion degraded examination. Negative noncontrast MRI brain for age. MRA HEAD: No acute large vessel occlusion or definite high-grade stenosis on this moderately motion degraded examination. Electronically Signed   By: Elon Alas M.D.   On: 07/13/2015 22:00   Mr Jodene Nam Head/brain Wo Cm  07/13/2015  CLINICAL DATA:  New onset speech changes and RIGHT-sided weakness. History of hypertension, diabetes, atrial fibrillation, carotid stenosis and carotid endarterectomy. EXAM: MRI HEAD WITHOUT CONTRAST MRA HEAD WITHOUT CONTRAST TECHNIQUE: Multiplanar, multiecho pulse sequences of the brain and surrounding structures were obtained without intravenous contrast. Angiographic images of the head were obtained using MRA technique without contrast. COMPARISON:  CT head July 13, 2015 at 1634 hours. FINDINGS: MRI HEAD FINDINGS Multiple sequences are mildly motion degraded. The ventricles and sulci are normal for patient's age. No abnormal parenchymal signal, mass lesions, mass effect. No reduced diffusion to suggest acute ischemia. Nonspecific punctate focus of susceptibility artifact LEFT cerebellum, RIGHT body of the corpus callosum. No abnormal extra-axial fluid collections. No extra-axial masses though, contrast enhanced sequences would be more sensitive. Normal major intracranial vascular flow voids seen at the skull base. Status post bilateral ocular lens implants, orbital contents are otherwise unremarkable. No abnormal sellar expansion. No suspicious calvarial bone marrow signal. Craniocervical junction maintained. Pan paranasal sinusitis, severe RIGHT sphenoid sinusitis. The mastoid air cells are well aerated. MRA HEAD FINDINGS Moderately motion degraded examination. Anterior circulation: Flow related  enhancement of the included cervical, petrous, cavernous and supraclinoid internal carotid arteries. Mild stenosis suspected bilateral supraclinoid internal carotid arteries in RIGHT cavernous carotid artery though motion degrades sensitivity. Patent anterior communicating artery. Normal flow related enhancement of the anterior and middle cerebral arteries, including distal segments. Supernumerary anterior cerebral artery arising from LEFT A1-2 junction. No large vessel occlusion, definite high-grade stenosis, abnormal luminal irregularity, aneurysm.  Posterior circulation: LEFT vertebral artery is dominant. Basilar artery is patent, with normal flow related enhancement of the main branch vessels. Normal flow related enhancement of the posterior cerebral arteries. Bilateral posterior communicating arteries present. No large vessel occlusion, definite high-grade stenosis, abnormal luminal irregularity, aneurysm. IMPRESSION: MRI HEAD: No acute intracranial process, specifically no acute ischemia on this mildly motion degraded examination. Negative noncontrast MRI brain for age. MRA HEAD: No acute large vessel occlusion or definite high-grade stenosis on this moderately motion degraded examination. Electronically Signed   By: Elon Alas M.D.   On: 07/13/2015 22:00    Oren Binet, MD  [facility] Pager:[phone]  If 7PM-7AM, please contact night-coverage [email or web address] Password [email or web address] 07/15/2015, 2:03 PM   LOS: 2 days

## 2015-07-16 DIAGNOSIS — I2510 Atherosclerotic heart disease of native coronary artery without angina pectoris: Secondary | ICD-10-CM

## 2015-07-16 LAB — GLUCOSE, CAPILLARY
GLUCOSE-CAPILLARY: 217 mg/dL — AB (ref 65–99)
GLUCOSE-CAPILLARY: 217 mg/dL — AB (ref 65–99)
GLUCOSE-CAPILLARY: 211 mg/dL — AB (ref 65–99)
Glucose-Capillary: 115 mg/dL — ABNORMAL HIGH (ref 65–99)

## 2015-07-16 LAB — BASIC METABOLIC PANEL
ANION GAP: 11 (ref 5–15)
BUN: 83 mg/dL — ABNORMAL HIGH (ref 6–20)
CHLORIDE: 105 mmol/L (ref 101–111)
CO2: 26 mmol/L (ref 22–32)
Calcium: 8.8 mg/dL — ABNORMAL LOW (ref 8.9–10.3)
Creatinine, Ser: 2.66 mg/dL — ABNORMAL HIGH (ref 0.44–1.00)
GFR calc non Af Amer: 16 mL/min — ABNORMAL LOW (ref >60)
GFR, EST AFRICAN AMERICAN: 19 mL/min — AB (ref >60)
GLUCOSE: 140 mg/dL — AB (ref 65–99)
POTASSIUM: 3.8 mmol/L (ref 3.5–5.1)
Sodium: 142 mmol/L (ref 135–145)

## 2015-07-16 LAB — CBC
HEMATOCRIT: 31.5 % — AB (ref 36.0–46.0)
HEMOGLOBIN: 10.5 g/dL — AB (ref 12.0–15.0)
MCH: 31 pg (ref 26.0–34.0)
MCHC: 33.3 g/dL (ref 30.0–36.0)
MCV: 92.9 fL (ref 78.0–100.0)
Platelets: 168 10*3/uL (ref 150–400)
RBC: 3.39 MIL/uL — AB (ref 3.87–5.11)
RDW: 15.1 % (ref 11.5–15.5)
WBC: 6.7 10*3/uL (ref 4.0–10.5)

## 2015-07-16 NOTE — Progress Notes (Signed)
PATIENT DETAILS Name: [name] Age: 75 y.o. Sex: female Date of Birth: 1941/06/22 Admit Date: 07/13/2015 Admitting Physician Ivor Costa, MD [id number] NEVILL, MD  Subjective: Awake and alert today  Assessment/Plan: Principal Problem: Acute encephalopathy:etiology remains unknown-doubt encephalitis-blood cultures show 1 bottle of coag neg staph-likely a contaminant. Off Acyclovir and IV Vancomycin-continue to monitor for another 24 hours, if improvement continues-suspect could be discharged home tomorrow. Note MRI neg for CVA, EEG x 2 neg    Active Problems: ? Atrial fibrillation:I do not see any documentation of atrial fibrillation and a prior notes oriented EKGs. Spoke with patient's primary cardiologist -Dr Aris Georgia suspects patient may have had transient A. fib after CABG a few years ago. If needed-he suggested outpatient 30 day monitor. Continue to monitor in telemetry.   CKD stage IV: Creatinine close to usual baseline,  follow electrolytes.  CAD: s/p of CABG 2013. No CP-continue aspirin, Lipitor and metoprolol  Essential hypertension: Controlled-continue metoprolol. Resume amlodipine, losartan over next 1-2 days  Dyslipidemia: Continue statin   History of carotid stenosis:status post bilateral carotid endarterectomy-continue aspirin and statin.   GERD: Continue PPI  Type 2 diabetes: CBGs stable, continue Lantus 20 units, and SSI  Gout: Stable without any evidence of flare-continue colchicine  Disposition: Remain inpatient-home tomorrow if clinical improvement continues  Antimicrobial agents  See below  Anti-infectives    Start     Dose/Rate Route Frequency Ordered Stop   07/17/15 0800  vancomycin (VANCOCIN) 1,250 mg in sodium chloride 0.9 % 250 mL IVPB  Status:  Discontinued     1,250 mg 166.7 mL/hr over 90 Minutes Intravenous Every 48 hours 07/15/15 0944 07/16/15 1027   07/15/15 1000  acyclovir (ZOVIRAX) 570 mg in dextrose 5 % 100  mL IVPB  Status:  Discontinued     10 mg/kg  57 kg (Ideal) 111.4 mL/hr over 60 Minutes Intravenous Every 24 hours 07/15/15 0948 07/15/15 1404   07/15/15 0945  vancomycin (VANCOCIN) 1,500 mg in sodium chloride 0.9 % 500 mL IVPB     1,500 mg 250 mL/hr over 120 Minutes Intravenous  Once 07/15/15 0934 07/15/15 1620      DVT Prophylaxis: Prophylactic Heparin   Code Status: Full code   Family Communication Daughter at bedside  Procedures: None  CONSULTS:  neurology  Time spent 20 minutes-Greater than 50% of this time was spent in counseling, explanation of diagnosis, planning of further management, and coordination of care.  MEDICATIONS: Scheduled Meds: . allopurinol  200 mg Oral Daily  . aspirin  325 mg Oral Daily  . atorvastatin  10 mg Oral Daily  . B-complex with vitamin C  1 tablet Oral Daily  . brimonidine  1 drop Left Eye BID  . calcitRIOL  0.25 mcg Oral QODAY  . cholecalciferol  2,000 Units Oral BID  . citalopram  20 mg Oral Daily  . colchicine  0.6 mg Oral BID  . heparin  5,000 Units Subcutaneous 3 times per day  . insulin aspart  0-9 Units Subcutaneous TID WC  . insulin glargine  20 Units Subcutaneous QHS  . metoprolol  50 mg Oral BID  . omega-3 acid ethyl esters  2 g Oral Daily  . pantoprazole  40 mg Oral Daily  . timolol  1 drop Left Eye Daily  . Travoprost (BAK Free)  1 drop Left Eye QHS   Continuous Infusions:  PRN Meds:.acetaminophen, HYDROcodone-acetaminophen, senna-docusate    PHYSICAL  EXAM: Vital signs in last 24 hours: Filed Vitals:   07/15/15 2139 07/16/15 0110 07/16/15 0543 07/16/15 1034  BP: 147/51 152/54 146/57 134/52  Pulse: 73 72 67 56  Temp: 97.9 F (36.6 C) 98.1 F (36.7 C) 98.2 F (36.8 C) 97.8 F (36.6 C)  TempSrc: Oral Oral Oral Oral  Resp: 18 20 20 15   Height:      Weight:      SpO2: 97% 95% 96% 98%    Weight change:  Filed Weights   07/13/15 1643 07/13/15 2156  Weight: 87.5 kg (192 lb 14.4 oz) 84.596 kg (186 lb 8 oz)     Body mass index is 31.04 kg/(m^2).   Gen Exam: Awake and alert, speech clear Neck: Supple, No JVD.   Chest: B/L Clear.  No rales or rhonchi CVS: S1 S2 Regular, no murmurs.  Abdomen: soft, BS +, non tender, non distended.  Extremities: no edema, lower extremities warm to touch. Neurologic: Non Focal.   Skin: No Rash.   Wounds: N/A.    Intake/Output from previous day: No intake or output data in the 24 hours ending 07/16/15 1425   LAB RESULTS: CBC  Recent Labs Lab 07/13/15 1630 07/13/15 1645 07/14/15 1600 07/16/15 0320  WBC 11.3*  --  10.2 6.7  HGB 12.6 13.6 11.0* 10.5*  HCT 37.7 40.0 33.8* 31.5*  PLT 178  --  161 168  MCV 92.2  --  94.4 92.9  MCH 30.8  --  30.7 31.0  MCHC 33.4  --  32.5 33.3  RDW 15.0  --  15.1 15.1  LYMPHSABS 2.4  --   --   --   MONOABS 0.7  --   --   --   EOSABS 0.2  --   --   --   BASOSABS 0.0  --   --   --     Chemistries   Recent Labs Lab 07/13/15 1630 07/13/15 1645 07/14/15 1600 07/16/15 0320  NA 142 141 141 142  K 3.8 3.8 3.9 3.8  CL 102 101 102 105  CO2 26  --  26 26  GLUCOSE 220* 218* 177* 140*  BUN 79* 80* 82* 83*  CREATININE 2.41* 2.40* 2.60* 2.66*  CALCIUM 9.1  --  9.0 8.8*    CBG:  Recent Labs Lab 07/14/15 2058 07/15/15 1328 07/15/15 1615 07/15/15 2138 07/16/15 0635  GLUCAP 204* 156* 175* 205* 115*    GFR Estimated Creatinine Clearance: 19.6 mL/min (by C-G formula based on Cr of 2.66).  Coagulation profile  Recent Labs Lab 07/13/15 1630  INR 1.01    Cardiac Enzymes No results for input(s): CKMB, TROPONINI, MYOGLOBIN in the last 168 hours.  Invalid input(s): CK  Invalid input(s): POCBNP No results for input(s): DDIMER in the last 72 hours.  Recent Labs  07/13/15 1630  HGBA1C 7.0*    Recent Labs  07/13/15 1630  CHOL 177  HDL 40*  LDLCALC 81  TRIG 280*  CHOLHDL 4.4    Recent Labs  07/14/15 1600  TSH 2.039    Recent Labs  07/14/15 1600  VITAMINB12 4206*   No results for  input(s): LIPASE, AMYLASE in the last 72 hours.  Urine Studies No results for input(s): UHGB, CRYS in the last 72 hours.  Invalid input(s): UACOL, UAPR, USPG, UPH, UTP, UGL, UKET, UBIL, UNIT, UROB, ULEU, UEPI, UWBC, URBC, UBAC, CAST, UCOM, BILUA  MICROBIOLOGY: Recent Results (from the past 240 hour(s))  Urine culture     Status: None  Collection Time: 07/13/15  6:00 PM  Result Value Ref Range Status   Specimen Description URINE, RANDOM  Final   Special Requests NONE  Final   Culture MULTIPLE SPECIES PRESENT, SUGGEST RECOLLECTION  Final   Report Status 07/14/2015 FINAL  Final  Culture, blood (Routine X 2) w Reflex to ID Panel     Status: None (Preliminary result)   Collection Time: 07/14/15  4:00 PM  Result Value Ref Range Status   Specimen Description BLOOD RIGHT ANTECUBITAL  Final   Special Requests BOTTLES DRAWN AEROBIC AND ANAEROBIC 10CC  Final   Culture  Setup Time   Final    GRAM POSITIVE COCCI IN PAIRS AEROBIC BOTTLE ONLY CRITICAL RESULT CALLED TO, READ BACK BY AND VERIFIED WITH: S WHALEY,RN AT [id number] 07/15/15 BY L BENFIELD    Culture   Final    STAPHYLOCOCCUS SPECIES (COAGULASE NEGATIVE) THE SIGNIFICANCE OF ISOLATING THIS ORGANISM FROM A SINGLE SET OF BLOOD CULTURES WHEN MULTIPLE SETS ARE DRAWN IS UNCERTAIN. PLEASE NOTIFY THE MICROBIOLOGY DEPARTMENT WITHIN ONE WEEK IF SPECIATION AND SENSITIVITIES ARE REQUIRED.    Report Status PENDING  Incomplete  Culture, blood (Routine X 2) w Reflex to ID Panel     Status: None (Preliminary result)   Collection Time: 07/14/15  4:25 PM  Result Value Ref Range Status   Specimen Description BLOOD RIGHT ANTECUBITAL  Final   Special Requests BOTTLES DRAWN AEROBIC AND ANAEROBIC 10CC  Final   Culture NO GROWTH < 24 HOURS  Final   Report Status PENDING  Incomplete    RADIOLOGY STUDIES/RESULTS: Dg Chest 2 View  07/13/2015  CLINICAL DATA:  Chest pain. EXAM: CHEST  2 VIEW COMPARISON:  05/31/2014 chest radiograph. FINDINGS: Median sternotomy  wires are aligned and intact. Stable cardiomediastinal silhouette with mild cardiomegaly. No pneumothorax. No pleural effusion. Clear lungs, with no focal lung consolidation and no pulmonary edema. IMPRESSION: Stable mild cardiomegaly without overt pulmonary edema. No active pulmonary disease. Electronically Signed   By: Ilona Sorrel M.D.   On: 07/13/2015 18:59   Dg Shoulder Right  07/13/2015  CLINICAL DATA:  75 year old female with right shoulder pain.  The EXAM: RIGHT SHOULDER - 2+ VIEW COMPARISON:  None. FINDINGS: No acute fracture or dislocation. A 5 mm sclerotic lesion in the proximal aspect of the humeral diaphysis likely represents a bone island. The soft tissues appear unremarkable with IMPRESSION: Negative. Electronically Signed   By: Anner Crete M.D.   On: 07/13/2015 18:59   Ct Head Wo Contrast  07/13/2015  CLINICAL DATA:  Slurred speech and right arm weakness, initial encounter EXAM: CT HEAD WITHOUT CONTRAST TECHNIQUE: Contiguous axial images were obtained from the base of the skull through the vertex without intravenous contrast. COMPARISON:  None. FINDINGS: Bony calvarium is intact. No gross soft tissue abnormality is noted. Some mucosal thickening is noted within the right sphenoid sinus. Diffuse basal ganglia calcifications are noted. No findings to suggest acute hemorrhage, acute infarction or space-occupying mass lesion are noted. Mild atrophic changes are noted. IMPRESSION: No acute intracranial abnormality noted. Critical Value/emergent results were called by telephone at the time of interpretation on 07/13/2015 at 4:43 pm to Dr. Nicole Kindred, who verbally acknowledged these results. Electronically Signed   By: Inez Catalina M.D.   On: 07/13/2015 16:46   Mr Brain Wo Contrast  07/13/2015  CLINICAL DATA:  New onset speech changes and RIGHT-sided weakness. History of hypertension, diabetes, atrial fibrillation, carotid stenosis and carotid endarterectomy. EXAM: MRI HEAD WITHOUT CONTRAST  MRA HEAD WITHOUT CONTRAST  TECHNIQUE: Multiplanar, multiecho pulse sequences of the brain and surrounding structures were obtained without intravenous contrast. Angiographic images of the head were obtained using MRA technique without contrast. COMPARISON:  CT head July 13, 2015 at 1634 hours. FINDINGS: MRI HEAD FINDINGS Multiple sequences are mildly motion degraded. The ventricles and sulci are normal for patient's age. No abnormal parenchymal signal, mass lesions, mass effect. No reduced diffusion to suggest acute ischemia. Nonspecific punctate focus of susceptibility artifact LEFT cerebellum, RIGHT body of the corpus callosum. No abnormal extra-axial fluid collections. No extra-axial masses though, contrast enhanced sequences would be more sensitive. Normal major intracranial vascular flow voids seen at the skull base. Status post bilateral ocular lens implants, orbital contents are otherwise unremarkable. No abnormal sellar expansion. No suspicious calvarial bone marrow signal. Craniocervical junction maintained. Pan paranasal sinusitis, severe RIGHT sphenoid sinusitis. The mastoid air cells are well aerated. MRA HEAD FINDINGS Moderately motion degraded examination. Anterior circulation: Flow related enhancement of the included cervical, petrous, cavernous and supraclinoid internal carotid arteries. Mild stenosis suspected bilateral supraclinoid internal carotid arteries in RIGHT cavernous carotid artery though motion degrades sensitivity. Patent anterior communicating artery. Normal flow related enhancement of the anterior and middle cerebral arteries, including distal segments. Supernumerary anterior cerebral artery arising from LEFT A1-2 junction. No large vessel occlusion, definite high-grade stenosis, abnormal luminal irregularity, aneurysm. Posterior circulation: LEFT vertebral artery is dominant. Basilar artery is patent, with normal flow related enhancement of the main branch vessels. Normal flow  related enhancement of the posterior cerebral arteries. Bilateral posterior communicating arteries present. No large vessel occlusion, definite high-grade stenosis, abnormal luminal irregularity, aneurysm. IMPRESSION: MRI HEAD: No acute intracranial process, specifically no acute ischemia on this mildly motion degraded examination. Negative noncontrast MRI brain for age. MRA HEAD: No acute large vessel occlusion or definite high-grade stenosis on this moderately motion degraded examination. Electronically Signed   By: Elon Alas M.D.   On: 07/13/2015 22:00   Mr Jodene Nam Head/brain Wo Cm  07/13/2015  CLINICAL DATA:  New onset speech changes and RIGHT-sided weakness. History of hypertension, diabetes, atrial fibrillation, carotid stenosis and carotid endarterectomy. EXAM: MRI HEAD WITHOUT CONTRAST MRA HEAD WITHOUT CONTRAST TECHNIQUE: Multiplanar, multiecho pulse sequences of the brain and surrounding structures were obtained without intravenous contrast. Angiographic images of the head were obtained using MRA technique without contrast. COMPARISON:  CT head July 13, 2015 at 1634 hours. FINDINGS: MRI HEAD FINDINGS Multiple sequences are mildly motion degraded. The ventricles and sulci are normal for patient's age. No abnormal parenchymal signal, mass lesions, mass effect. No reduced diffusion to suggest acute ischemia. Nonspecific punctate focus of susceptibility artifact LEFT cerebellum, RIGHT body of the corpus callosum. No abnormal extra-axial fluid collections. No extra-axial masses though, contrast enhanced sequences would be more sensitive. Normal major intracranial vascular flow voids seen at the skull base. Status post bilateral ocular lens implants, orbital contents are otherwise unremarkable. No abnormal sellar expansion. No suspicious calvarial bone marrow signal. Craniocervical junction maintained. Pan paranasal sinusitis, severe RIGHT sphenoid sinusitis. The mastoid air cells are well aerated.  MRA HEAD FINDINGS Moderately motion degraded examination. Anterior circulation: Flow related enhancement of the included cervical, petrous, cavernous and supraclinoid internal carotid arteries. Mild stenosis suspected bilateral supraclinoid internal carotid arteries in RIGHT cavernous carotid artery though motion degrades sensitivity. Patent anterior communicating artery. Normal flow related enhancement of the anterior and middle cerebral arteries, including distal segments. Supernumerary anterior cerebral artery arising from LEFT A1-2 junction. No large vessel occlusion, definite high-grade stenosis, abnormal luminal irregularity, aneurysm.  Posterior circulation: LEFT vertebral artery is dominant. Basilar artery is patent, with normal flow related enhancement of the main branch vessels. Normal flow related enhancement of the posterior cerebral arteries. Bilateral posterior communicating arteries present. No large vessel occlusion, definite high-grade stenosis, abnormal luminal irregularity, aneurysm. IMPRESSION: MRI HEAD: No acute intracranial process, specifically no acute ischemia on this mildly motion degraded examination. Negative noncontrast MRI brain for age. MRA HEAD: No acute large vessel occlusion or definite high-grade stenosis on this moderately motion degraded examination. Electronically Signed   By: Elon Alas M.D.   On: 07/13/2015 22:00    Oren Binet, MD  [facility] Pager:[phone]  If 7PM-7AM, please contact night-coverage [email or web address] Password [email or web address] 07/16/2015, 2:25 PM   LOS: 3 days

## 2015-07-16 NOTE — Progress Notes (Addendum)
STROKE TEAM PROGRESS NOTE   HISTORY [name] is an 75 y.o. female with a history of hypertension, diabetes mellitus, atrial fibrillation not on anticoagulation, coronary artery disease, bilateral carotid artery stenosis and CEA, and TIA, brought to the emergency room and code stroke status following acute onset of speech output changes. Patient reportedly was repeating herself repeating others, and at times at speech content that was nonsensical. She also had right upper extremity weakness in route to the ED. She has been taking aspirin daily. CT scan of her head showed no acute intracranial abnormality. NIH stroke score was 3. Right upper extremity was essentially resolved and drift was felt to be due to pain and guarding involving right shoulder. He tended to repeat it was said to her as well as her rate with verbal responses to questions. She was LKW 1330 on 07/13/2015. Patient was not administered TPA secondary to minimal deficits. She was admitted for further evaluation and treatment.   SUBJECTIVE (INTERVAL HISTORY) Her husband, daughter and granddaughther are at the bedside. mental status much improved. So far work up all negative except 1/2 blood culture positive for G+ cocci in pairs. She was put on Abx.   OBJECTIVE Temp:  [97.7 F (36.5 C)-98.2 F (36.8 C)] 98.1 F (36.7 C) (01/01 0110) Pulse Rate:  [58-83] 72 (01/01 0110) Cardiac Rhythm:  [-] Normal sinus rhythm (12/31 2023) Resp:  [18-20] 20 (01/01 0110) BP: (130-152)/(48-72) 152/54 mmHg (01/01 0110) SpO2:  [95 %-99 %] 95 % (01/01 0110)  CBC:   Recent Labs Lab 07/13/15 1630 07/13/15 1645 07/14/15 1600  WBC 11.3*  --  10.2  NEUTROABS 8.1*  --   --   HGB 12.6 13.6 11.0*  HCT 37.7 40.0 33.8*  MCV 92.2  --  94.4  PLT 178  --  [id number]    Basic Metabolic Panel:   Recent Labs Lab 07/13/15 1630 07/13/15 1645 07/14/15 1600  NA 142 141 141  K 3.8 3.8 3.9  CL 102 101 102  CO2 26  --  26  GLUCOSE 220* 218* 177*  BUN  79* 80* 82*  CREATININE 2.41* 2.40* 2.60*  CALCIUM 9.1  --  9.0    Lipid Panel:     Component Value Date/Time   CHOL 177 07/13/2015 1630   TRIG 280* 07/13/2015 1630   HDL 40* 07/13/2015 1630   CHOLHDL 4.4 07/13/2015 1630   VLDL 56* 07/13/2015 1630   LDLCALC 81 07/13/2015 1630   HgbA1c:  Lab Results  Component Value Date   HGBA1C 7.0* 07/13/2015   Urine Drug Screen:     Component Value Date/Time   LABOPIA NONE DETECTED 07/13/2015 1800   COCAINSCRNUR NONE DETECTED 07/13/2015 1800   LABBENZ NONE DETECTED 07/13/2015 1800   AMPHETMU NONE DETECTED 07/13/2015 1800   THCU NONE DETECTED 07/13/2015 1800   LABBARB NONE DETECTED 07/13/2015 1800      IMAGING I have personally reviewed the radiological images below and agree with the radiology interpretations.  Dg Chest 2 View 07/13/2015 Stable mild cardiomegaly without overt pulmonary edema. No active pulmonary disease.   Dg Shoulder Right 07/13/2015 Negative.   Ct Head Wo Contrast 07/13/2015 No acute intracranial abnormality noted.   MRI HEAD 07/13/2015 : No acute intracranial process, specifically no acute ischemia on this mildly motion degraded examination. Negative noncontrast MRI brain for age.   MRA HEAD 07/13/2015 No acute large vessel occlusion or definite high-grade stenosis on this moderately motion degraded examination.   EEG 07/15/15 - Interpretation: This  EEG is abnormal with mild nonspecific generalized slowing of cerebral activity which can be seen with metabolic as well as toxic and degenerative encephalopathies. No evidence of an epileptic disorder was demonstrated. Compared to previous study on 07/14/2015, there is slight improvement in background cerebral activity.  EEG 07/14/15- this is an abnormal awake and asleep EEG that demonstrated findings consistent with a mild non specific encephalopathy. No electrographic seizures seen.  CUS - Findings suggest 1-39% internal carotid artery stenosis  bilaterally. The left vertebral artery is patent with antegrade flow. Unable to visualize the right vertebral artery.  TTE - - Left ventricle: The cavity size was normal. Wall thickness was normal. Systolic function was normal. The estimated ejection fraction was in the range of 55% to 60%. - Mitral valve: Calcified annulus. Mildly thickened leaflets . - Left atrium: The atrium was mildly dilated. - Atrial septum: No defect or patent foramen ovale was identified.  PHYSICAL EXAM  Temp:  [97.7 F (36.5 C)-98.2 F (36.8 C)] 98.1 F (36.7 C) (01/01 0110) Pulse Rate:  [58-83] 72 (01/01 0110) Resp:  [18-20] 20 (01/01 0110) BP: (130-152)/(48-72) 152/54 mmHg (01/01 0110) SpO2:  [95 %-99 %] 95 % (01/01 0110)  General - obesity, well developed, not in acute distress.   Ophthalmologic - Fundi not visualized due to noncooperation.  Cardiovascular - Regular rate and rhythm with no murmur.  Neck - supple, no meningismus   Mental Status -  Level of arousal and orientation to time, place, and person were intact. Language including expression, naming, repetition, comprehension was assessed and found intact. Attention span and concentration were normal, although slow. Recent and remote memory were 3/3 registration and 2/3 delayed recall. Fund of Knowledge was assessed and was intact.  Cranial Nerves II - XII - II - Visual field intact OU. III, IV, VI - Extraocular movements intact. V - Facial sensation intact bilaterally. VII - Facial movement intact bilaterally. VIII - Hearing & vestibular intact bilaterally. X - Palate elevates symmetrically. XI - Chin turning & shoulder shrug intact bilaterally. XII - Tongue protrusion intact.  Motor Strength - The patient's strength was normal in all extremities and pronator drift was absent.  Bulk was normal and fasciculations were absent.   Motor Tone - Muscle tone was assessed at the neck and appendages and was normal.  Reflexes - The  patient's reflexes were 1+ in all extremities and she had no pathological reflexes.  Sensory - Light touch, temperature/pinprick were assessed and were symmetrical.    Coordination - The patient had normal movements in the hands with no ataxia or dysmetria.  Tremor was absent.  Gait and Station - not tested due to safety concerns  ASSESSMENT/PLAN Ms. MERIEL LAMBING is a 75 y.o. female with history of hypertension, diabetes mellitus, atrial fibrillation not on anticoagulation, coronary artery disease, bilateral carotid artery stenosis and CEA, and TIA presenting with abnormal speech output. She did not receive IV t-PA due to minimal deficits.    Encephalopathy, improved.  NO acute stroke  Resultant slow in response  MRI  No acute stroke  MRA  No large vessel stenosis  Carotid Doppler  unremarkable   2D Echo  EF 55-60%  EEG x 2 slow background but no seizure   LDL 81  HgbA1c 7.0  Heparin 5000 units sq tid for VTE prophylaxis Diet heart healthy/carb modified Room service appropriate?: Yes; Fluid consistency:: Thin  aspirin 325 mg daily prior to admission, now on aspirin 325 mg daily.  Ongoing aggressive stroke  risk factor management  Therapy recommendations:  HH PT  Disposition:  pending   Acute delirium  UA negative for UTI  CXR negative for pneumonia  Cre 2.40, improved from previous  BUN 80, improved from previous  LFT WNL  Ammonia level WNL  Bacteremia ?  1/2 BCx showed G+ cocci in pairs  On vanco   WBC trending down  afebrile  ?? Atrial Fibrillation  hx of PUD per daughter  No mention of AFIB in his last cardiology note  Family denies hx of afib  B/l carotid stenosis s/p CEA  Left CEA 7-8 years ago  Right CEA 04/2015  CUS pending     Hypertension  Stable  Hyperlipidemia  Home meds:  lipitor 10, resumed in hospital  LDL 81  Continue statin at discharge  Diabetes  HgbA1c 7.0, at goal < 7.0  Glucose stable during  admission  SSI  Other Stroke Risk Factors  Advanced age  Obesity, Body mass index is 31.04 kg/(m^2).   Hx stroke/TIA  2009 d/t carotid stenosis  Coronary artery disease d/p CABG  Other Active Problems  Recent URI per family, lasted ~10 days, took alkaseltzer plus cold gels  GERD  CKD stage V  Hospital day # 3  Debra Hawking, MD PhD Stroke Neurology 07/16/2015 1:22 AM     To contact Stroke Continuity provider, please refer to [email or web address] After hours, contact General Neurology

## 2015-07-16 NOTE — Progress Notes (Signed)
STROKE TEAM PROGRESS NOTE   SUBJECTIVE (INTERVAL HISTORY) Her husband, daughter and granddaughther are at the bedside. mental status much improved. She is sitting in chair, cheerful and seems back to baseline.  OBJECTIVE Temp:  [97.8 F (36.6 C)-98.2 F (36.8 C)] 97.8 F (36.6 C) (01/01 1034) Pulse Rate:  [56-73] 56 (01/01 1034) Cardiac Rhythm:  [-] Normal sinus rhythm (01/01 0803) Resp:  [15-20] 15 (01/01 1034) BP: (134-152)/(51-72) 134/52 mmHg (01/01 1034) SpO2:  [95 %-98 %] 98 % (01/01 1034)  CBC:   Recent Labs Lab 07/13/15 1630  07/14/15 1600 07/16/15 0320  WBC 11.3*  --  10.2 6.7  NEUTROABS 8.1*  --   --   --   HGB 12.6  < > 11.0* 10.5*  HCT 37.7  < > 33.8* 31.5*  MCV 92.2  --  94.4 92.9  PLT 178  --  161 168  < > = values in this interval not displayed.  Basic Metabolic Panel:   Recent Labs Lab 07/14/15 1600 07/16/15 0320  NA 141 142  K 3.9 3.8  CL 102 105  CO2 26 26  GLUCOSE 177* 140*  BUN 82* 83*  CREATININE 2.60* 2.66*  CALCIUM 9.0 8.8*    Lipid Panel:     Component Value Date/Time   CHOL 177 07/13/2015 1630   TRIG 280* 07/13/2015 1630   HDL 40* 07/13/2015 1630   CHOLHDL 4.4 07/13/2015 1630   VLDL 56* 07/13/2015 1630   LDLCALC 81 07/13/2015 1630   HgbA1c:  Lab Results  Component Value Date   HGBA1C 7.0* 07/13/2015   Urine Drug Screen:     Component Value Date/Time   LABOPIA NONE DETECTED 07/13/2015 1800   COCAINSCRNUR NONE DETECTED 07/13/2015 1800   LABBENZ NONE DETECTED 07/13/2015 1800   AMPHETMU NONE DETECTED 07/13/2015 1800   THCU NONE DETECTED 07/13/2015 1800   LABBARB NONE DETECTED 07/13/2015 1800      IMAGING I have personally reviewed the radiological images below and agree with the radiology interpretations.  Dg Chest 2 View 07/13/2015 Stable mild cardiomegaly without overt pulmonary edema. No active pulmonary disease.   Dg Shoulder Right 07/13/2015 Negative.   Ct Head Wo Contrast 07/13/2015 No acute  intracranial abnormality noted.   MRI HEAD 07/13/2015 : No acute intracranial process, specifically no acute ischemia on this mildly motion degraded examination. Negative noncontrast MRI brain for age.   MRA HEAD 07/13/2015 No acute large vessel occlusion or definite high-grade stenosis on this moderately motion degraded examination.   EEG 07/15/15 - Interpretation: This EEG is abnormal with mild nonspecific generalized slowing of cerebral activity which can be seen with metabolic as well as toxic and degenerative encephalopathies. No evidence of an epileptic disorder was demonstrated. Compared to previous study on 07/14/2015, there is slight improvement in background cerebral activity.  EEG 07/14/15- this is an abnormal awake and asleep EEG that demonstrated findings consistent with a mild non specific encephalopathy. No electrographic seizures seen.  CUS - Findings suggest 1-39% internal carotid artery stenosis bilaterally. The left vertebral artery is patent with antegrade flow. Unable to visualize the right vertebral artery.  TTE - - Left ventricle: The cavity size was normal. Wall thickness was normal. Systolic function was normal. The estimated ejection fraction was in the range of 55% to 60%. - Mitral valve: Calcified annulus. Mildly thickened leaflets . - Left atrium: The atrium was mildly dilated. - Atrial septum: No defect or patent foramen ovale was identified.  PHYSICAL EXAM  Temp:  [97.8 F (  36.6 C)-98.2 F (36.8 C)] 97.8 F (36.6 C) (01/01 1034) Pulse Rate:  [56-73] 56 (01/01 1034) Resp:  [15-20] 15 (01/01 1034) BP: (134-152)/(51-72) 134/52 mmHg (01/01 1034) SpO2:  [95 %-98 %] 98 % (01/01 1034)  General - obesity, well developed, not in acute distress.   Ophthalmologic - Fundi not visualized due to noncooperation.  Cardiovascular - Regular rate and rhythm with no murmur.  Neck - supple, no meningismus   Mental Status -  Level of arousal and orientation  to time, place, and person were intact. Language including expression, naming, repetition, comprehension was assessed and found intact. Attention span and concentration were normal, although slow. Recent and remote memory were 3/3 registration and 2/3 delayed recall. Fund of Knowledge was assessed and was intact.  Cranial Nerves II - XII - II - Visual field intact OU. III, IV, VI - Extraocular movements intact. V - Facial sensation intact bilaterally. VII - Facial movement intact bilaterally. VIII - Hearing & vestibular intact bilaterally. X - Palate elevates symmetrically. XI - Chin turning & shoulder shrug intact bilaterally. XII - Tongue protrusion intact.  Motor Strength - The patient's strength was normal in all extremities and pronator drift was absent.  Bulk was normal and fasciculations were absent.   Motor Tone - Muscle tone was assessed at the neck and appendages and was normal.  Reflexes - The patient's reflexes were 1+ in all extremities and she had no pathological reflexes.  Sensory - Light touch, temperature/pinprick were assessed and were symmetrical.    Coordination - The patient had normal movements in the hands with no ataxia or dysmetria.  Tremor was absent.  Gait and Station - not tested due to safety concerns  ASSESSMENT/PLAN Ms. AAMYA KIENTZ is a 75 y.o. female with history of hypertension, diabetes mellitus, atrial fibrillation not on anticoagulation, coronary artery disease, bilateral carotid artery stenosis and CEA, and TIA presenting with abnormal speech output. She did not receive IV t-PA due to minimal deficits.    Encephalopathy, resolved.  NO acute stroke  Resultant slow in response  MRI  No acute stroke  MRA  No large vessel stenosis  Carotid Doppler  unremarkable   2D Echo  EF 55-60%  EEG x 2 slow background but no seizure   LDL 81  HgbA1c 7.0  Heparin 5000 units sq tid for VTE prophylaxis Diet heart healthy/carb modified Room  service appropriate?: Yes; Fluid consistency:: Thin  aspirin 325 mg daily prior to admission, now on aspirin 325 mg daily.  Ongoing aggressive stroke risk factor management  Therapy recommendations:  HH PT  Disposition:  pending   Acute delirium, resolved, etiology not clear. ?? Seizure with prolonged post ictal??   UA negative for UTI  CXR negative for pneumonia  Cre 2.40, improved from previous  BUN 80, improved from previous  LFT WNL  Ammonia level WNL  Bacteremia ?  1/2 BCx showed G+ cocci in pairs but coagulase negative  On vanco   WBC trending down  Afebrile  Could be contamination  ?? Atrial Fibrillation  hx of PUD per daughter  No mention of AFIB in his last cardiology note  Family denies hx of afib  Primary cardiologist Dr. Irish Lack suspect transient afib s/p CABG   B/l carotid stenosis s/p CEA  Left CEA 7-8 years ago  Right CEA 04/2015  CUS unremarkable    Hypertension  Stable  Hyperlipidemia  Home meds:  lipitor 10, resumed in hospital  LDL 81  Continue statin at  discharge  Diabetes  HgbA1c 7.0, at goal < 7.0  Glucose stable during admission  SSI  Other Stroke Risk Factors  Advanced age  Obesity, Body mass index is 31.04 kg/(m^2).   Hx stroke/TIA  2009 d/t carotid stenosis  Coronary artery disease d/p CABG  Other Active Problems  Recent URI per family, lasted ~10 days, took alkaseltzer plus cold gels  GERD  CKD stage V  Hospital day # 3  Neurology will sign off. Please call with questions. Pt will follow up with Dr. Erlinda Hong at [facility] in about 2 months. Thanks for the consult.  Rosalin Hawking, MD PhD Stroke Neurology 07/16/2015 4:11 PM     To contact Stroke Continuity provider, please refer to [email or web address] After hours, contact General Neurology

## 2015-07-17 LAB — GLUCOSE, CAPILLARY
Glucose-Capillary: 116 mg/dL — ABNORMAL HIGH (ref 65–99)
Glucose-Capillary: 158 mg/dL — ABNORMAL HIGH (ref 65–99)

## 2015-07-17 MED ORDER — COLCHICINE 0.6 MG PO TABS: 0.6000 mg | ORAL_TABLET | Freq: Every day | ORAL | Status: DC

## 2015-07-17 NOTE — Evaluation (Signed)
Occupational Therapy Evaluation Patient Details Name: [name] MRN: [id number] DOB: Sep 22, 1940 Today's Date: 07/17/2015    History of Present Illness 75 y.o. female with a history of hypertension, diabetes mellitus, atrial fibrillation not on anticoagulation, coronary artery disease, bilateral carotid artery stenosis and CEA, and TIA, brought to the emergency room and code stroke status following acute onset of speech changes. Patient reportedly was repeating herself repeating others, and at times was nonsensical. MRI negative for stroke   Clinical Impression   Patient presenting with decreased ADL and functional mobility independence secondary to above. Patient independent to mod I PTA. Patient currently functioning at an overall min to max assist level. Min assist for UB ADLs and functional ambulation, mod assist for sit to/from stands, and up to max assist for LB ADLs. Patient will benefit from acute OT to increase overall independence in the areas of ADLs, functional mobility, and overall safety in order to safely discharge home with husband, daughter, and HHOT.   Pt with increased pain in right shoulder with movement. As explained below, pt unable to actively flex shoulder more than ~30* and pt with increased pain during PROM.    Follow Up Recommendations  Home health OT;Supervision/Assistance - 24 hour    Equipment Recommendations  Other (comment) (AE - reacher, sock aid, LH sponge, LH shoe horn)    Recommendations for Other Services  None at this time    Precautions / Restrictions Precautions Precautions: Fall Precaution Comments: Pt reports she falls, but "not lately".  Restrictions Weight Bearing Restrictions: No      Mobility Bed Mobility General bed mobility comments: Pt seated in recliner upon OT entering/exiting room  Transfers Overall transfer level: Needs assistance Equipment used: Rolling walker (2 wheeled) Transfers: Sit to/from Stand Sit to Stand: Mod  assist;Min assist General transfer comment: Min assist from recliner, mod assist from comfort height toilet seat    Balance Overall balance assessment: Needs assistance;History of Falls Sitting-balance support: No upper extremity supported;Feet supported Sitting balance-Leahy Scale: Fair     Standing balance support: Bilateral upper extremity supported;During functional activity Standing balance-Leahy Scale: Fair    ADL Overall ADL's : Needs assistance/impaired Eating/Feeding: Set up;Sitting Eating/Feeding Details (indicate cue type and reason): Pt using L hand due to decreased ROM & strength and increased pain in RUE Grooming: Wash/dry hands;Standing;Min guard   Upper Body Bathing: Minimal assitance;Sitting   Lower Body Bathing: Moderate assistance;Sit to/from stand   Upper Body Dressing : Minimal assistance;Sitting   Lower Body Dressing: Maximal assistance;Sit to/from stand   Toilet Transfer: Moderate assistance;Comfort height toilet;Ambulation Toilet Transfer Details (indicate cue type and reason): mod assist for sit to stand transfer Norman and Hygiene: Supervision/safety;Sitting/lateral lean Toileting - Clothing Manipulation Details (indicate cue type and reason): using left hand     Functional mobility during ADLs: Minimal assistance;Rolling walker General ADL Comments: Cues for safety with RW during functional mobility around room. Pt required mod assist to stand from comfort height toilet seat using grab bars. Educated patient on using her 3-n-1 in shower for safety due to high risk of falling and h/o falls. Pt may benefit from education on use of AE to increase independence with LB ADLs. According to patient, she's been having a difficult time with donning/doffing socks for awhile; "my husband does that for me".     Vision Additional Comments: no change from baseline          Pertinent Vitals/Pain Pain Assessment: No/denies pain     Hand  Dominance Right   Extremity/Trunk Assessment Upper Extremity Assessment Upper Extremity Assessment: RUE deficits/detail RUE Deficits / Details: Decreased AROM and increased pain with PROM, pt only able to flex right shoulder to about 30* RUE Sensation: decreased light touch (Pt reports, "I feel my left arm better than my right") RUE Coordination:  (decreased grip strength)   Lower Extremity Assessment Lower Extremity Assessment: Defer to PT evaluation   Cervical / Trunk Assessment Cervical / Trunk Assessment: Kyphotic (slight )   Communication Communication Communication: HOH   Cognition Arousal/Alertness: Awake/alert Behavior During Therapy: WFL for tasks assessed/performed Overall Cognitive Status: No family/caregiver present to determine baseline cognitive functioning (seems back to baseline, just HOH) Area of Impairment:  (pt oriented X4)     Memory: Decreased short-term memory (Reports falls at home, but can remember last one)              Home Living Family/patient expects to be discharged to:: Private residence Living Arrangements: Spouse/significant other Available Help at Discharge: Family;Available 24 hours/day Type of Home: House Home Access: Level entry     Home Layout: One level     Bathroom Shower/Tub: Walk-in shower;Door   Bathroom Toilet: Handicapped height     Home Equipment: Environmental consultant - 4 wheels;Cane - single point;Bedside commode   Prior Functioning/Environment Level of Independence: Needs assistance  Gait / Transfers Assistance Needed: frequent falls due to imbalance; only uses cane when goes out; uses no device inside    OT Diagnosis: Generalized weakness;Acute pain   OT Problem List: Decreased strength;Decreased range of motion;Decreased activity tolerance;Impaired balance (sitting and/or standing);Decreased cognition;Decreased safety awareness;Decreased knowledge of use of DME or AE;Decreased knowledge of precautions;Pain   OT  Treatment/Interventions: Self-care/ADL training;Therapeutic exercise;DME and/or AE instruction;Energy conservation;Therapeutic activities;Patient/family education;Balance training    OT Goals(Current goals can be found in the care plan section) Acute Rehab OT Goals Patient Stated Goal: none stated OT Goal Formulation: With patient Time For Goal Achievement: 07/31/15 Potential to Achieve Goals: Good ADL Goals Pt Will Perform Grooming: with modified independence;standing Pt Will Perform Lower Body Bathing: with supervision;sit to/from stand;with adaptive equipment Pt Will Perform Lower Body Dressing: with supervision;with adaptive equipment;sit to/from stand Pt Will Transfer to Toilet: with supervision;bedside commode;ambulating Pt Will Perform Tub/Shower Transfer: Shower transfer;ambulating;3 in 1;rolling walker;with supervision Pt/caregiver will Perform Home Exercise Program: Increased ROM;Increased strength;Right Upper extremity;With Supervision;With written HEP provided (self ROM) Additional ADL Goal #1: Pt will be supervision with functional mobility using LRAD  OT Frequency: Min 2X/week   Barriers to D/C: none known at this time   End of Session Equipment Utilized During Treatment: Gait belt;Rolling walker  Activity Tolerance: Patient tolerated treatment well Patient left: in chair;with call bell/phone within reach;with chair alarm set   Time: [phone] OT Time Calculation (min): 23 min Charges:  OT General Charges $OT Visit: 1 Procedure OT Evaluation $Initial OT Evaluation Tier I: 1 Procedure OT Treatments $Self Care/Home Management : 8-22 mins  MOD tier evaluation  Chrys Racer , MS, OTR/L, CLT Pager: [phone]  07/17/2015, 9:33 AM

## 2015-07-17 NOTE — Discharge Summary (Signed)
PATIENT DETAILS Name: [name] Age: 75 y.o. Sex: female Date of Birth: 10-Aug-1940 MRN: [id number]. Admitting Physician: Ivor Costa, MD [id number] NEVILL, MD  Admit Date: 07/13/2015 Discharge date: 07/17/2015  Recommendations for Outpatient Follow-up:  1. Please repeat CBC/BMET at next visit 2. Please follow blood cultures till final  PRIMARY DISCHARGE DIAGNOSIS:  Principal Problem:   Acute encephalopathy Active Problems:   Coronary arteriosclerosis   Essential hypertension   Hyperlipidemia   Carotid artery stenosis   Hypertension   Glaucoma   GERD (gastroesophageal reflux disease)   Atrial fibrillation (HCC)   CAD (coronary artery disease)   Depression   Diabetes mellitus without complication (HCC)   Chronic kidney disease (CKD), stage V (HCC)   Stroke (cerebrum) (HCC)   Acute delirium       PAST MEDICAL HISTORY: Past Medical History  Diagnosis Date  . Anemia   . Glaucoma   . Hypertension   . Blood transfusion   . GERD (gastroesophageal reflux disease)   . Arthritis   . Neuromuscular disorder ([place])     CARPEL TUNNEL  . Atrial fibrillation ([place])   . CAD (coronary artery disease)   . Carotid artery occlusion     Carotid Endartectom,y - left 2009.  Blockage Right being watched by Dr Scot Dock.  Marland Kitchen HOH (hard of hearing)   . Shortness of breath   . Depression   . History of kidney stones     passed  . Carotid stenosis   . Complication of anesthesia     pt. states that she was difficult to wake  . Stroke ([place])     hx of TIA  . 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 Cancer ([place])     .  top of head- melonoma  . Dysrhythmia   . History of pneumonia   . Diabetes mellitus without complication ([place])     DISCHARGE MEDICATIONS: Current Discharge Medication List    CONTINUE these medications which have NOT CHANGED   Details  allopurinol (ZYLOPRIM) 100 MG tablet Take 200 mg by mouth daily.     amLODipine (NORVASC) 10 MG tablet TAKE  ONE TABLET BY MOUTH ONE  TIME DAILY Qty: 30 tablet, Refills: 1    aspirin 325 MG tablet Take 325 mg by mouth daily.    atorvastatin (LIPITOR) 10 MG tablet TAKE ONE TABLET BY MOUTH ONE TIME DAILY Qty: 30 tablet, Refills: 0    B Complex Vitamins (VITAMIN B COMPLEX PO) Take 1 tablet by mouth daily.    brimonidine (ALPHAGAN P) 0.1 % SOLN Place 1 drop into the left eye 2 (two) times daily.    calcitRIOL (ROCALTROL) 0.25 MCG capsule Take 0.25 mcg by mouth every other day.    Cholecalciferol (VITAMIN D) 2000 UNITS tablet Take 2,000 Units by mouth 2 (two) times daily.    citalopram (CELEXA) 20 MG tablet Take 20 mg by mouth daily.    fish oil-omega-3 fatty acids 1000 MG capsule Take 2 g by mouth daily.    furosemide (LASIX) 40 MG tablet Take 80 mg by mouth 2 (two) times daily.     HUMALOG 100 UNIT/ML injection Inject 0-12 Units into the skin 3 (three) times daily with meals. Sliding scale    HYDROcodone-acetaminophen (NORCO/VICODIN) 5-325 MG tablet Take 1-2 tablets by mouth every 4 (four) hours as needed for moderate pain. Qty: 30 tablet, Refills: 0    insulin glargine (LANTUS) 100 UNIT/ML injection Inject 20-30 Units into the  skin at bedtime.     linagliptin (TRADJENTA) 5 MG TABS tablet Take 1 tablet (5 mg total) by mouth daily with breakfast. Qty: 30 tablet, Refills: 1    losartan (COZAAR) 100 MG tablet Take 1 tablet by mouth daily. Refills: 5    metoprolol (LOPRESSOR) 50 MG tablet Take 50 mg by mouth 2 (two) times daily.    omeprazole (PRILOSEC) 20 MG capsule Take 20 mg by mouth daily as needed. Acid reflux    timolol (TIMOPTIC) 0.5 % ophthalmic solution Place 1 drop into the left eye daily.    travoprost, benzalkonium, (TRAVATAN) 0.004 % ophthalmic solution Place 1 drop into the left eye at bedtime.    acetaminophen (TYLENOL) 500 MG tablet Take 500 mg by mouth every 6 (six) hours as needed. For pain    colchicine 0.6 MG tablet Take 0.6 mg by mouth 2 (two) times daily. gout        STOP taking these medications     oxyCODONE (ROXICODONE) 5 MG immediate release tablet         ALLERGIES:   Allergies  Allergen Reactions  . Gabapentin Other (See Comments)    hallucinations    BRIEF HPI:  See H&P, Labs, Consult and Test reports for all details in brief, [name] is an 75 y.o. female with a history of hypertension, diabetes mellitus, atrial fibrillation not on anticoagulation, coronary artery disease, bilateral carotid artery stenosis and CEA, and TIA, brought to the emergency room and code stroke status following acute onset of speech output changes. Patient reportedly was repeating herself repeating others, and at times at speech content that was nonsensical. She also had right upper extremity weakness in route to the ED. She has been taking aspirin daily. CT scan of her head showed no acute intracranial abnormality. NIH stroke score was 3. Right upper extremity was essentially resolved and drift was felt to be due to pain and guarding involving right shoulder. He tended to repeat it was said to her as well as her rate with verbal responses to questions. She was LKW 1330 on 07/13/2015. Patient was not administered TPA secondary to minimal deficits. She was admitted for further evaluation and treatment.  CONSULTATIONS:   neurology  PERTINENT RADIOLOGIC STUDIES: Dg Chest 2 View  07/13/2015  CLINICAL DATA:  Chest pain. EXAM: CHEST  2 VIEW COMPARISON:  05/31/2014 chest radiograph. FINDINGS: Median sternotomy wires are aligned and intact. Stable cardiomediastinal silhouette with mild cardiomegaly. No pneumothorax. No pleural effusion. Clear lungs, with no focal lung consolidation and no pulmonary edema. IMPRESSION: Stable mild cardiomegaly without overt pulmonary edema. No active pulmonary disease. Electronically Signed   By: Ilona Sorrel M.D.   On: 07/13/2015 18:59   Dg Shoulder Right  07/13/2015  CLINICAL DATA:  75 year old female with right shoulder pain.  The  EXAM: RIGHT SHOULDER - 2+ VIEW COMPARISON:  None. FINDINGS: No acute fracture or dislocation. A 5 mm sclerotic lesion in the proximal aspect of the humeral diaphysis likely represents a bone island. The soft tissues appear unremarkable with IMPRESSION: Negative. Electronically Signed   By: Anner Crete M.D.   On: 07/13/2015 18:59   Ct Head Wo Contrast  07/13/2015  CLINICAL DATA:  Slurred speech and right arm weakness, initial encounter EXAM: CT HEAD WITHOUT CONTRAST TECHNIQUE: Contiguous axial images were obtained from the base of the skull through the vertex without intravenous contrast. COMPARISON:  None. FINDINGS: Bony calvarium is intact. No gross soft tissue abnormality is noted. Some mucosal thickening  is noted within the right sphenoid sinus. Diffuse basal ganglia calcifications are noted. No findings to suggest acute hemorrhage, acute infarction or space-occupying mass lesion are noted. Mild atrophic changes are noted. IMPRESSION: No acute intracranial abnormality noted. Critical Value/emergent results were called by telephone at the time of interpretation on 07/13/2015 at 4:43 pm to Dr. Nicole Kindred, who verbally acknowledged these results. Electronically Signed   By: Inez Catalina M.D.   On: 07/13/2015 16:46   Mr Brain Wo Contrast  07/13/2015  CLINICAL DATA:  New onset speech changes and RIGHT-sided weakness. History of hypertension, diabetes, atrial fibrillation, carotid stenosis and carotid endarterectomy. EXAM: MRI HEAD WITHOUT CONTRAST MRA HEAD WITHOUT CONTRAST TECHNIQUE: Multiplanar, multiecho pulse sequences of the brain and surrounding structures were obtained without intravenous contrast. Angiographic images of the head were obtained using MRA technique without contrast. COMPARISON:  CT head July 13, 2015 at 1634 hours. FINDINGS: MRI HEAD FINDINGS Multiple sequences are mildly motion degraded. The ventricles and sulci are normal for patient's age. No abnormal parenchymal signal, mass  lesions, mass effect. No reduced diffusion to suggest acute ischemia. Nonspecific punctate focus of susceptibility artifact LEFT cerebellum, RIGHT body of the corpus callosum. No abnormal extra-axial fluid collections. No extra-axial masses though, contrast enhanced sequences would be more sensitive. Normal major intracranial vascular flow voids seen at the skull base. Status post bilateral ocular lens implants, orbital contents are otherwise unremarkable. No abnormal sellar expansion. No suspicious calvarial bone marrow signal. Craniocervical junction maintained. Pan paranasal sinusitis, severe RIGHT sphenoid sinusitis. The mastoid air cells are well aerated. MRA HEAD FINDINGS Moderately motion degraded examination. Anterior circulation: Flow related enhancement of the included cervical, petrous, cavernous and supraclinoid internal carotid arteries. Mild stenosis suspected bilateral supraclinoid internal carotid arteries in RIGHT cavernous carotid artery though motion degrades sensitivity. Patent anterior communicating artery. Normal flow related enhancement of the anterior and middle cerebral arteries, including distal segments. Supernumerary anterior cerebral artery arising from LEFT A1-2 junction. No large vessel occlusion, definite high-grade stenosis, abnormal luminal irregularity, aneurysm. Posterior circulation: LEFT vertebral artery is dominant. Basilar artery is patent, with normal flow related enhancement of the main branch vessels. Normal flow related enhancement of the posterior cerebral arteries. Bilateral posterior communicating arteries present. No large vessel occlusion, definite high-grade stenosis, abnormal luminal irregularity, aneurysm. IMPRESSION: MRI HEAD: No acute intracranial process, specifically no acute ischemia on this mildly motion degraded examination. Negative noncontrast MRI brain for age. MRA HEAD: No acute large vessel occlusion or definite high-grade stenosis on this moderately  motion degraded examination. Electronically Signed   By: Elon Alas M.D.   On: 07/13/2015 22:00   Mr Jodene Nam Head/brain Wo Cm  07/13/2015  CLINICAL DATA:  New onset speech changes and RIGHT-sided weakness. History of hypertension, diabetes, atrial fibrillation, carotid stenosis and carotid endarterectomy. EXAM: MRI HEAD WITHOUT CONTRAST MRA HEAD WITHOUT CONTRAST TECHNIQUE: Multiplanar, multiecho pulse sequences of the brain and surrounding structures were obtained without intravenous contrast. Angiographic images of the head were obtained using MRA technique without contrast. COMPARISON:  CT head July 13, 2015 at 1634 hours. FINDINGS: MRI HEAD FINDINGS Multiple sequences are mildly motion degraded. The ventricles and sulci are normal for patient's age. No abnormal parenchymal signal, mass lesions, mass effect. No reduced diffusion to suggest acute ischemia. Nonspecific punctate focus of susceptibility artifact LEFT cerebellum, RIGHT body of the corpus callosum. No abnormal extra-axial fluid collections. No extra-axial masses though, contrast enhanced sequences would be more sensitive. Normal major intracranial vascular flow voids seen at the skull base.  Status post bilateral ocular lens implants, orbital contents are otherwise unremarkable. No abnormal sellar expansion. No suspicious calvarial bone marrow signal. Craniocervical junction maintained. Pan paranasal sinusitis, severe RIGHT sphenoid sinusitis. The mastoid air cells are well aerated. MRA HEAD FINDINGS Moderately motion degraded examination. Anterior circulation: Flow related enhancement of the included cervical, petrous, cavernous and supraclinoid internal carotid arteries. Mild stenosis suspected bilateral supraclinoid internal carotid arteries in RIGHT cavernous carotid artery though motion degrades sensitivity. Patent anterior communicating artery. Normal flow related enhancement of the anterior and middle cerebral arteries, including distal  segments. Supernumerary anterior cerebral artery arising from LEFT A1-2 junction. No large vessel occlusion, definite high-grade stenosis, abnormal luminal irregularity, aneurysm. Posterior circulation: LEFT vertebral artery is dominant. Basilar artery is patent, with normal flow related enhancement of the main branch vessels. Normal flow related enhancement of the posterior cerebral arteries. Bilateral posterior communicating arteries present. No large vessel occlusion, definite high-grade stenosis, abnormal luminal irregularity, aneurysm. IMPRESSION: MRI HEAD: No acute intracranial process, specifically no acute ischemia on this mildly motion degraded examination. Negative noncontrast MRI brain for age. MRA HEAD: No acute large vessel occlusion or definite high-grade stenosis on this moderately motion degraded examination. Electronically Signed   By: Elon Alas M.D.   On: 07/13/2015 22:00     PERTINENT LAB RESULTS: CBC:  Recent Labs  07/14/15 1600 07/16/15 0320  WBC 10.2 6.7  HGB 11.0* 10.5*  HCT 33.8* 31.5*  PLT 161 168   CMET CMP     Component Value Date/Time   NA 142 07/16/2015 0320   K 3.8 07/16/2015 0320   CL 105 07/16/2015 0320   CO2 26 07/16/2015 0320   GLUCOSE 140* 07/16/2015 0320   BUN 83* 07/16/2015 0320   CREATININE 2.66* 07/16/2015 0320   CALCIUM 8.8* 07/16/2015 0320   PROT 6.1* 07/14/2015 1600   ALBUMIN 3.1* 07/14/2015 1600   AST 15 07/14/2015 1600   ALT 12* 07/14/2015 1600   ALKPHOS 59 07/14/2015 1600   BILITOT 0.4 07/14/2015 1600   GFRNONAA 16* 07/16/2015 0320   GFRAA 19* 07/16/2015 0320    GFR Estimated Creatinine Clearance: 19.6 mL/min (by C-G formula based on Cr of 2.66). No results for input(s): LIPASE, AMYLASE in the last 72 hours. No results for input(s): CKTOTAL, CKMB, CKMBINDEX, TROPONINI in the last 72 hours. Invalid input(s): POCBNP No results for input(s): DDIMER in the last 72 hours. No results for input(s): HGBA1C in the last 72  hours. No results for input(s): CHOL, HDL, LDLCALC, TRIG, CHOLHDL, LDLDIRECT in the last 72 hours.  Recent Labs  07/14/15 1600  TSH 2.039    Recent Labs  07/14/15 1600  VITAMINB12 4206*   Coags: No results for input(s): INR in the last 72 hours.  Invalid input(s): PT Microbiology: Recent Results (from the past 240 hour(s))  Urine culture     Status: None   Collection Time: 07/13/15  6:00 PM  Result Value Ref Range Status   Specimen Description URINE, RANDOM  Final   Special Requests NONE  Final   Culture MULTIPLE SPECIES PRESENT, SUGGEST RECOLLECTION  Final   Report Status 07/14/2015 FINAL  Final  Culture, blood (Routine X 2) w Reflex to ID Panel     Status: None (Preliminary result)   Collection Time: 07/14/15  4:00 PM  Result Value Ref Range Status   Specimen Description BLOOD RIGHT ANTECUBITAL  Final   Special Requests BOTTLES DRAWN AEROBIC AND ANAEROBIC 10CC  Final   Culture  Setup Time   Final  GRAM POSITIVE COCCI IN PAIRS AEROBIC BOTTLE ONLY CRITICAL RESULT CALLED TO, READ BACK BY AND VERIFIED WITH: S WHALEY,RN AT [id number] 07/15/15 BY L BENFIELD    Culture   Final    STAPHYLOCOCCUS SPECIES (COAGULASE NEGATIVE) THE SIGNIFICANCE OF ISOLATING THIS ORGANISM FROM A SINGLE SET OF BLOOD CULTURES WHEN MULTIPLE SETS ARE DRAWN IS UNCERTAIN. PLEASE NOTIFY THE MICROBIOLOGY DEPARTMENT WITHIN ONE WEEK IF SPECIATION AND SENSITIVITIES ARE REQUIRED.    Report Status PENDING  Incomplete  Culture, blood (Routine X 2) w Reflex to ID Panel     Status: None (Preliminary result)   Collection Time: 07/14/15  4:25 PM  Result Value Ref Range Status   Specimen Description BLOOD RIGHT ANTECUBITAL  Final   Special Requests BOTTLES DRAWN AEROBIC AND ANAEROBIC 10CC  Final   Culture NO GROWTH 2 DAYS  Final   Report Status PENDING  Incomplete  Culture, blood (routine x 2)     Status: None (Preliminary result)   Collection Time: 07/15/15  6:59 PM  Result Value Ref Range Status   Specimen  Description BLOOD RIGHT ANTECUBITAL  Final   Special Requests BOTTLES DRAWN AEROBIC AND ANAEROBIC 10CC   Final   Culture NO GROWTH < 24 HOURS  Final   Report Status PENDING  Incomplete  Culture, blood (routine x 2)     Status: None (Preliminary result)   Collection Time: 07/15/15  7:09 PM  Result Value Ref Range Status   Specimen Description BLOOD RIGHT ANTECUBITAL  Final   Special Requests BOTTLES DRAWN AEROBIC AND ANAEROBIC 10CC   Final   Culture NO GROWTH < 24 HOURS  Final   Report Status PENDING  Incomplete     BRIEF HOSPITAL COURSE:  Acute encephalopathy:etiology remains unknown-doubt encephalitis-blood cultures showed 1 bottle of coag neg staph-likely a contaminant. Repeat blood cultures remain negative. Initially started on acyclovir and IV vancomycin, however rapidly improved-and felt not to have any source of infection. All antimicrobial agents was discontinued on 07/16/15-she was monitored off antibiotics for 24 hours-her mental status continued to improve and now she is back to usual baseline. Neurology has no further recommendations, and is okay for discharge home today.  Note MRI brain neg for CVA, EEG x 2 neg   Active Problems: ? Atrial fibrillation:I do not see any documentation of atrial fibrillation and a prior notes oriented EKGs. Spoke with patient's primary cardiologist -Dr Aris Georgia suspects patient may have had transient A. fib after CABG a few years ago. If needed-he suggested outpatient 30 day monitor. Since this encephalopathy was not considered secondary to TIA/CVA-suspect does not need a outpatient monitoring this time   CKD stage IV: Creatinine close to usual baseline, follow electrolytes periodically as outpatient. Consider referral to nephrology.  CAD: s/p of CABG 2013. No CP-continue aspirin, Lipitor and metoprolol  Essential hypertension: Controlled-resume usual antihypertensives on discharge.   Dyslipidemia: Continue statin   History of carotid  stenosis:status post bilateral carotid endarterectomy-continue aspirin and statin.   GERD: Continue PPI  Type 2 diabetes: CBGs stable, continue Lantus 20 units on discharge  Gout: Stable without any evidence of flare-continue colchicine  TODAY-DAY OF DISCHARGE:  Subjective:   Ameelia Stampone today has no headache,no chest abdominal pain,no new weakness tingling or numbness, feels much better wants to go home today.   Objective:   Blood pressure 140/47, pulse 57, temperature 98.1 F (36.7 C), temperature source Oral, resp. rate 16, height 5\' 5"  (1.651 m), weight 84.596 kg (186 lb 8 oz), SpO2 97 %. No  intake or output data in the 24 hours ending 07/17/15 1044 Filed Weights   07/13/15 1643 07/13/15 2156  Weight: 87.5 kg (192 lb 14.4 oz) 84.596 kg (186 lb 8 oz)    Exam Awake Alert, Oriented *3, No new F.N deficits, Normal affect [place].AT,PERRAL Supple Neck,No JVD, No cervical lymphadenopathy appriciated.  Symmetrical Chest wall movement, Good air movement bilaterally, CTAB RRR,No Gallops,Rubs or new Murmurs, No Parasternal Heave +ve B.Sounds, Abd Soft, Non tender, No organomegaly appriciated, No rebound -guarding or rigidity. No Cyanosis, Clubbing or edema, No new Rash or bruise  DISCHARGE CONDITION: Stable  DISPOSITION: Home with home health services  DISCHARGE INSTRUCTIONS:    Activity:  As tolerated with Full fall precautions use walker/cane & assistance as needed  Get Medicines reviewed and adjusted: Please take all your medications with you for your next visit with your Primary MD  Please request your Primary MD to go over all hospital tests and procedure/radiological results at the follow up, please ask your Primary MD to get all Hospital records sent to his/her office.  If you experience worsening of your admission symptoms, develop shortness of breath, life threatening emergency, suicidal or homicidal thoughts you must seek medical attention immediately by calling 911  or calling your MD immediately  if symptoms less severe.  You must read complete instructions/literature along with all the possible adverse reactions/side effects for all the Medicines you take and that have been prescribed to you. Take any new Medicines after you have completely understood and accpet all the possible adverse reactions/side effects.   Do not drive when taking Pain medications.   Do not take more than prescribed Pain, Sleep and Anxiety Medications  Special Instructions: If you have smoked or chewed Tobacco  in the last 2 yrs please stop smoking, stop any regular Alcohol  and or any Recreational drug use.  Wear Seat belts while driving.  Please note  You were cared for by a hospitalist during your hospital stay. Once you are discharged, your primary care physician will handle any further medical issues. Please note that NO REFILLS for any discharge medications will be authorized once you are discharged, as it is imperative that you return to your primary care physician (or establish a relationship with a primary care physician if you do not have one) for your aftercare needs so that they can reassess your need for medications and monitor your lab values.   Diet recommendation: Diabetic Diet Heart Healthy diet   Discharge Instructions    Ambulatory referral to Neurology    Complete by:  As directed   Pt will follow up with Dr. Erlinda Hong at [facility] in about 2 months. Thanks.     Call MD for:  extreme fatigue    Complete by:  As directed      Call MD for:  persistant dizziness or light-headedness    Complete by:  As directed      Diet - low sodium heart healthy    Complete by:  As directed      Diet Carb Modified    Complete by:  As directed      Increase activity slowly    Complete by:  As directed            Follow-up Information    Follow up with Xu,Jindong, MD. Schedule an appointment as soon as possible for a visit in 2 months.   Specialty:  Neurology   Why:  stroke  clinic   Contact information:  [address] [place] [address] [phone]       Follow up with GATES,ROBERT NEVILL, MD. Schedule an appointment as soon as possible for a visit in 1 week.   Specialty:  Internal Medicine   Why:  Hospital follow up   Contact information:   [address] [place]  [address] [phone]       Total Time spent on discharge equals 45 minutes.  SignedOren Binet 07/17/2015 10:44 AM

## 2015-07-17 NOTE — Progress Notes (Signed)
Completely awake and alert, per family (at bedside) back to baseline. Stable for discharge today.  Please see discharge summary for details.

## 2015-07-17 NOTE — Care Management Note (Signed)
Case Management Note  Patient Details  Name: [name] MRN: [id number] Date of Birth: 01/10/1941  Subjective/Objective:                    Action/Plan: Plan is for patient to discharge home with home health services. CM met with the patient and provided her a list of home health agencies in the [facility] area. She selected [facility]. CM spoke with [facility] and faxed them the information requested. Bedside RN updated.   Expected Discharge Date:                  Expected Discharge Plan:  [place]  In-House Referral:     Discharge planning Services  CM Consult  Post Acute Care Choice:  Home Health Choice offered to:  Patient  DME Arranged:    DME Agency:     HH Arranged:  PT [place]:  [place]  Status of Service:  Completed, signed off  Medicare Important Message Given:    Date Medicare IM Given:    Medicare IM give by:    Date Additional Medicare IM Given:    Additional Medicare Important Message give by:     If discussed at [place] of Stay Meetings, dates discussed:    Additional Comments:  Pollie Friar, RN 07/17/2015, 10:55 AM

## 2015-07-17 NOTE — Progress Notes (Signed)
Patient is discharged from room 5M17 at this time. Alert and in stable condition. IV site d/c'd as well as tele. EMMI instructions not given due to negative MRI. Discharge instructions read to patient and husband and understanding verbalized. Left unit via wheelchair with all belongings at side.

## 2015-07-18 LAB — THYROGLOBULIN ANTIBODY: Thyroglobulin Antibody: <1 IU/mL (ref 0.0–0.9)

## 2015-07-18 LAB — CULTURE, BLOOD (ROUTINE X 2)

## 2015-07-19 ENCOUNTER — Encounter (HOSPITAL_COMMUNITY)
Admission: RE | Admit: 2015-07-19 | Discharge: 2015-07-19 | Disposition: A | Discharge status: Home / Self Care | Payer: Medicare Other | Source: Ambulatory Visit | Attending: Nephrology | Admitting: Nephrology

## 2015-07-19 DIAGNOSIS — D631 Anemia in chronic kidney disease: Secondary | ICD-10-CM | POA: Diagnosis not present

## 2015-07-19 DIAGNOSIS — N184 Chronic kidney disease, stage 4 (severe): Secondary | ICD-10-CM | POA: Diagnosis not present

## 2015-07-19 LAB — POCT HEMOGLOBIN-HEMACUE: HEMOGLOBIN: 11.6 g/dL — AB (ref 12.0–15.0)

## 2015-07-19 LAB — IRON AND TIBC
Iron: 105 ug/dL (ref 28–170)
SATURATION RATIOS: 29 % (ref 10.4–31.8)
TIBC: 357 ug/dL (ref 250–450)
UIBC: 252 ug/dL

## 2015-07-19 LAB — CULTURE, BLOOD (ROUTINE X 2): Culture: NO GROWTH

## 2015-07-19 LAB — FERRITIN: Ferritin: 259 ng/mL (ref 11–307)

## 2015-07-19 MED ORDER — EPOETIN ALFA 20000 UNIT/ML IJ SOLN
20000.0000 [IU] | INTRAMUSCULAR | Status: DC
  Administered 2015-07-19: 20000 [IU] via SUBCUTANEOUS

## 2015-07-19 MED ORDER — EPOETIN ALFA 20000 UNIT/ML IJ SOLN
INTRAMUSCULAR | Status: AC
  Filled 2015-07-19: qty 1

## 2015-07-19 NOTE — Progress Notes (Signed)
07/19/2015--0955--CM received a call from [facility] today that they would not be able to staff PT visits to the patient. Patient discharged Monday and CM spoke with [facility] and faxed them the information requested on Monday. CM called and spoke with Ms Carnathan husband this am and informed him that [facility] would not be able to provide PT services and he was good with changing agencies to [place]. Butch Penny with [facility] notified and accepted the referral. Patient has MD appointment today so would not want a visit until tomorrow. Butch Penny with [facility] informed.

## 2015-07-20 LAB — CULTURE, BLOOD (ROUTINE X 2)
Culture: NO GROWTH
Culture: NO GROWTH

## 2015-07-21 DIAGNOSIS — K219 Gastro-esophageal reflux disease without esophagitis: Secondary | ICD-10-CM | POA: Diagnosis not present

## 2015-07-21 DIAGNOSIS — H9190 Unspecified hearing loss, unspecified ear: Secondary | ICD-10-CM | POA: Diagnosis not present

## 2015-07-21 DIAGNOSIS — I4891 Unspecified atrial fibrillation: Secondary | ICD-10-CM | POA: Diagnosis not present

## 2015-07-21 DIAGNOSIS — I6523 Occlusion and stenosis of bilateral carotid arteries: Secondary | ICD-10-CM | POA: Diagnosis not present

## 2015-07-21 DIAGNOSIS — E785 Hyperlipidemia, unspecified: Secondary | ICD-10-CM | POA: Diagnosis not present

## 2015-07-21 DIAGNOSIS — E1122 Type 2 diabetes mellitus with diabetic chronic kidney disease: Secondary | ICD-10-CM | POA: Diagnosis not present

## 2015-07-21 DIAGNOSIS — I2510 Atherosclerotic heart disease of native coronary artery without angina pectoris: Secondary | ICD-10-CM | POA: Diagnosis not present

## 2015-07-21 DIAGNOSIS — I1311 Hypertensive heart and chronic kidney disease without heart failure, with stage 5 chronic kidney disease, or end stage renal disease: Secondary | ICD-10-CM | POA: Diagnosis not present

## 2015-07-21 DIAGNOSIS — Z794 Long term (current) use of insulin: Secondary | ICD-10-CM | POA: Diagnosis not present

## 2015-07-21 DIAGNOSIS — N185 Chronic kidney disease, stage 5: Secondary | ICD-10-CM | POA: Diagnosis not present

## 2015-07-24 DIAGNOSIS — I4891 Unspecified atrial fibrillation: Secondary | ICD-10-CM | POA: Diagnosis not present

## 2015-07-24 DIAGNOSIS — I6523 Occlusion and stenosis of bilateral carotid arteries: Secondary | ICD-10-CM | POA: Diagnosis not present

## 2015-07-24 DIAGNOSIS — E1122 Type 2 diabetes mellitus with diabetic chronic kidney disease: Secondary | ICD-10-CM | POA: Diagnosis not present

## 2015-07-24 DIAGNOSIS — N185 Chronic kidney disease, stage 5: Secondary | ICD-10-CM | POA: Diagnosis not present

## 2015-07-24 DIAGNOSIS — I2510 Atherosclerotic heart disease of native coronary artery without angina pectoris: Secondary | ICD-10-CM | POA: Diagnosis not present

## 2015-07-24 DIAGNOSIS — I1311 Hypertensive heart and chronic kidney disease without heart failure, with stage 5 chronic kidney disease, or end stage renal disease: Secondary | ICD-10-CM | POA: Diagnosis not present

## 2015-07-25 ENCOUNTER — Telehealth: Payer: Self-pay | Admitting: Interventional Cardiology

## 2015-07-25 DIAGNOSIS — N2581 Secondary hyperparathyroidism of renal origin: Secondary | ICD-10-CM | POA: Diagnosis not present

## 2015-07-25 DIAGNOSIS — N184 Chronic kidney disease, stage 4 (severe): Secondary | ICD-10-CM | POA: Diagnosis not present

## 2015-07-25 DIAGNOSIS — I480 Paroxysmal atrial fibrillation: Secondary | ICD-10-CM

## 2015-07-25 DIAGNOSIS — D631 Anemia in chronic kidney disease: Secondary | ICD-10-CM | POA: Diagnosis not present

## 2015-07-25 DIAGNOSIS — I129 Hypertensive chronic kidney disease with stage 1 through stage 4 chronic kidney disease, or unspecified chronic kidney disease: Secondary | ICD-10-CM | POA: Diagnosis not present

## 2015-07-25 NOTE — Telephone Encounter (Signed)
Please order continuous tele monitor to eval for AFib in this patient with TIA.  Jettie Booze, MD

## 2015-07-25 NOTE — Telephone Encounter (Signed)
Spoke to pt and made her aware of new orders per Dr. Irish Lack. Pt agreeable to wear monitor. Advised pt that someone from our [facility] team will call her to schedule. Pt verbalized understanding.

## 2015-07-26 DIAGNOSIS — I4891 Unspecified atrial fibrillation: Secondary | ICD-10-CM | POA: Diagnosis not present

## 2015-07-26 DIAGNOSIS — E1122 Type 2 diabetes mellitus with diabetic chronic kidney disease: Secondary | ICD-10-CM | POA: Diagnosis not present

## 2015-07-26 DIAGNOSIS — I6523 Occlusion and stenosis of bilateral carotid arteries: Secondary | ICD-10-CM | POA: Diagnosis not present

## 2015-07-26 DIAGNOSIS — I1311 Hypertensive heart and chronic kidney disease without heart failure, with stage 5 chronic kidney disease, or end stage renal disease: Secondary | ICD-10-CM | POA: Diagnosis not present

## 2015-07-26 DIAGNOSIS — I2510 Atherosclerotic heart disease of native coronary artery without angina pectoris: Secondary | ICD-10-CM | POA: Diagnosis not present

## 2015-07-26 DIAGNOSIS — N185 Chronic kidney disease, stage 5: Secondary | ICD-10-CM | POA: Diagnosis not present

## 2015-07-27 ENCOUNTER — Ambulatory Visit (INDEPENDENT_AMBULATORY_CARE_PROVIDER_SITE_OTHER): Payer: Medicare Other

## 2015-07-27 DIAGNOSIS — E0840 Diabetes mellitus due to underlying condition with diabetic neuropathy, unspecified: Secondary | ICD-10-CM | POA: Diagnosis not present

## 2015-07-27 DIAGNOSIS — I25111 Atherosclerotic heart disease of native coronary artery with angina pectoris with documented spasm: Secondary | ICD-10-CM | POA: Diagnosis not present

## 2015-07-27 DIAGNOSIS — I480 Paroxysmal atrial fibrillation: Secondary | ICD-10-CM | POA: Diagnosis not present

## 2015-07-27 DIAGNOSIS — M545 Low back pain: Secondary | ICD-10-CM | POA: Diagnosis not present

## 2015-07-27 DIAGNOSIS — Z794 Long term (current) use of insulin: Secondary | ICD-10-CM | POA: Diagnosis not present

## 2015-07-31 DIAGNOSIS — I6523 Occlusion and stenosis of bilateral carotid arteries: Secondary | ICD-10-CM | POA: Diagnosis not present

## 2015-07-31 DIAGNOSIS — I4891 Unspecified atrial fibrillation: Secondary | ICD-10-CM | POA: Diagnosis not present

## 2015-07-31 DIAGNOSIS — I1311 Hypertensive heart and chronic kidney disease without heart failure, with stage 5 chronic kidney disease, or end stage renal disease: Secondary | ICD-10-CM | POA: Diagnosis not present

## 2015-07-31 DIAGNOSIS — E1122 Type 2 diabetes mellitus with diabetic chronic kidney disease: Secondary | ICD-10-CM | POA: Diagnosis not present

## 2015-07-31 DIAGNOSIS — N185 Chronic kidney disease, stage 5: Secondary | ICD-10-CM | POA: Diagnosis not present

## 2015-07-31 DIAGNOSIS — I2510 Atherosclerotic heart disease of native coronary artery without angina pectoris: Secondary | ICD-10-CM | POA: Diagnosis not present

## 2015-08-02 ENCOUNTER — Encounter (HOSPITAL_COMMUNITY)
Admission: RE | Admit: 2015-08-02 | Discharge: 2015-08-02 | Disposition: A | Discharge status: Home / Self Care | Payer: Medicare Other | Source: Ambulatory Visit | Attending: Nephrology | Admitting: Nephrology

## 2015-08-02 DIAGNOSIS — N184 Chronic kidney disease, stage 4 (severe): Secondary | ICD-10-CM | POA: Diagnosis not present

## 2015-08-02 DIAGNOSIS — D631 Anemia in chronic kidney disease: Secondary | ICD-10-CM | POA: Diagnosis not present

## 2015-08-02 LAB — POCT HEMOGLOBIN-HEMACUE: Hemoglobin: 12.2 g/dL (ref 12.0–15.0)

## 2015-08-02 MED ORDER — EPOETIN ALFA 20000 UNIT/ML IJ SOLN: 20000.0000 [IU] | INTRAMUSCULAR | Status: DC

## 2015-08-03 DIAGNOSIS — I4891 Unspecified atrial fibrillation: Secondary | ICD-10-CM | POA: Diagnosis not present

## 2015-08-03 DIAGNOSIS — E1122 Type 2 diabetes mellitus with diabetic chronic kidney disease: Secondary | ICD-10-CM | POA: Diagnosis not present

## 2015-08-03 DIAGNOSIS — I2510 Atherosclerotic heart disease of native coronary artery without angina pectoris: Secondary | ICD-10-CM | POA: Diagnosis not present

## 2015-08-03 DIAGNOSIS — I6523 Occlusion and stenosis of bilateral carotid arteries: Secondary | ICD-10-CM | POA: Diagnosis not present

## 2015-08-03 DIAGNOSIS — N185 Chronic kidney disease, stage 5: Secondary | ICD-10-CM | POA: Diagnosis not present

## 2015-08-03 DIAGNOSIS — I1311 Hypertensive heart and chronic kidney disease without heart failure, with stage 5 chronic kidney disease, or end stage renal disease: Secondary | ICD-10-CM | POA: Diagnosis not present

## 2015-08-04 DIAGNOSIS — I1311 Hypertensive heart and chronic kidney disease without heart failure, with stage 5 chronic kidney disease, or end stage renal disease: Secondary | ICD-10-CM | POA: Diagnosis not present

## 2015-08-04 DIAGNOSIS — I4891 Unspecified atrial fibrillation: Secondary | ICD-10-CM | POA: Diagnosis not present

## 2015-08-04 DIAGNOSIS — E1122 Type 2 diabetes mellitus with diabetic chronic kidney disease: Secondary | ICD-10-CM | POA: Diagnosis not present

## 2015-08-04 DIAGNOSIS — I6523 Occlusion and stenosis of bilateral carotid arteries: Secondary | ICD-10-CM | POA: Diagnosis not present

## 2015-08-04 DIAGNOSIS — N185 Chronic kidney disease, stage 5: Secondary | ICD-10-CM | POA: Diagnosis not present

## 2015-08-04 DIAGNOSIS — I2510 Atherosclerotic heart disease of native coronary artery without angina pectoris: Secondary | ICD-10-CM | POA: Diagnosis not present

## 2015-08-09 DIAGNOSIS — E1122 Type 2 diabetes mellitus with diabetic chronic kidney disease: Secondary | ICD-10-CM | POA: Diagnosis not present

## 2015-08-09 DIAGNOSIS — I4891 Unspecified atrial fibrillation: Secondary | ICD-10-CM | POA: Diagnosis not present

## 2015-08-09 DIAGNOSIS — I1311 Hypertensive heart and chronic kidney disease without heart failure, with stage 5 chronic kidney disease, or end stage renal disease: Secondary | ICD-10-CM | POA: Diagnosis not present

## 2015-08-09 DIAGNOSIS — I6523 Occlusion and stenosis of bilateral carotid arteries: Secondary | ICD-10-CM | POA: Diagnosis not present

## 2015-08-09 DIAGNOSIS — I2510 Atherosclerotic heart disease of native coronary artery without angina pectoris: Secondary | ICD-10-CM | POA: Diagnosis not present

## 2015-08-09 DIAGNOSIS — N185 Chronic kidney disease, stage 5: Secondary | ICD-10-CM | POA: Diagnosis not present

## 2015-08-11 DIAGNOSIS — I1311 Hypertensive heart and chronic kidney disease without heart failure, with stage 5 chronic kidney disease, or end stage renal disease: Secondary | ICD-10-CM | POA: Diagnosis not present

## 2015-08-11 DIAGNOSIS — N185 Chronic kidney disease, stage 5: Secondary | ICD-10-CM | POA: Diagnosis not present

## 2015-08-11 DIAGNOSIS — E1122 Type 2 diabetes mellitus with diabetic chronic kidney disease: Secondary | ICD-10-CM | POA: Diagnosis not present

## 2015-08-11 DIAGNOSIS — I6523 Occlusion and stenosis of bilateral carotid arteries: Secondary | ICD-10-CM | POA: Diagnosis not present

## 2015-08-11 DIAGNOSIS — I2510 Atherosclerotic heart disease of native coronary artery without angina pectoris: Secondary | ICD-10-CM | POA: Diagnosis not present

## 2015-08-11 DIAGNOSIS — I4891 Unspecified atrial fibrillation: Secondary | ICD-10-CM | POA: Diagnosis not present

## 2015-08-14 DIAGNOSIS — H2511 Age-related nuclear cataract, right eye: Secondary | ICD-10-CM | POA: Diagnosis not present

## 2015-08-14 DIAGNOSIS — E113491 Type 2 diabetes mellitus with severe nonproliferative diabetic retinopathy without macular edema, right eye: Secondary | ICD-10-CM | POA: Diagnosis not present

## 2015-08-14 DIAGNOSIS — H35372 Puckering of macula, left eye: Secondary | ICD-10-CM | POA: Diagnosis not present

## 2015-08-14 DIAGNOSIS — E113592 Type 2 diabetes mellitus with proliferative diabetic retinopathy without macular edema, left eye: Secondary | ICD-10-CM | POA: Diagnosis not present

## 2015-08-15 ENCOUNTER — Other Ambulatory Visit (HOSPITAL_COMMUNITY): Payer: Self-pay | Admitting: *Deleted

## 2015-08-16 ENCOUNTER — Encounter (HOSPITAL_COMMUNITY)
Admission: RE | Admit: 2015-08-16 | Discharge: 2015-08-16 | Disposition: A | Discharge status: Home / Self Care | Payer: Medicare Other | Source: Ambulatory Visit | Attending: Nephrology | Admitting: Nephrology

## 2015-08-16 DIAGNOSIS — D631 Anemia in chronic kidney disease: Secondary | ICD-10-CM | POA: Insufficient documentation

## 2015-08-16 DIAGNOSIS — N184 Chronic kidney disease, stage 4 (severe): Secondary | ICD-10-CM | POA: Insufficient documentation

## 2015-08-16 LAB — POCT HEMOGLOBIN-HEMACUE: Hemoglobin: 12 g/dL (ref 12.0–15.0)

## 2015-08-16 MED ORDER — EPOETIN ALFA 20000 UNIT/ML IJ SOLN: 20000.0000 [IU] | INTRAMUSCULAR | Status: DC

## 2015-08-21 DIAGNOSIS — Z961 Presence of intraocular lens: Secondary | ICD-10-CM | POA: Diagnosis not present

## 2015-08-21 DIAGNOSIS — H401131 Primary open-angle glaucoma, bilateral, mild stage: Secondary | ICD-10-CM | POA: Diagnosis not present

## 2015-08-21 DIAGNOSIS — E119 Type 2 diabetes mellitus without complications: Secondary | ICD-10-CM | POA: Diagnosis not present

## 2015-08-23 DIAGNOSIS — M4806 Spinal stenosis, lumbar region: Secondary | ICD-10-CM | POA: Diagnosis not present

## 2015-08-23 DIAGNOSIS — M5416 Radiculopathy, lumbar region: Secondary | ICD-10-CM | POA: Diagnosis not present

## 2015-08-30 ENCOUNTER — Encounter (HOSPITAL_COMMUNITY)
Admission: RE | Admit: 2015-08-30 | Discharge: 2015-08-30 | Disposition: A | Discharge status: Home / Self Care | Payer: Medicare Other | Source: Ambulatory Visit | Attending: Nephrology | Admitting: Nephrology

## 2015-08-30 DIAGNOSIS — N184 Chronic kidney disease, stage 4 (severe): Secondary | ICD-10-CM | POA: Diagnosis not present

## 2015-08-30 DIAGNOSIS — D631 Anemia in chronic kidney disease: Secondary | ICD-10-CM | POA: Diagnosis not present

## 2015-08-30 MED ORDER — EPOETIN ALFA 20000 UNIT/ML IJ SOLN
20000.0000 [IU] | INTRAMUSCULAR | Status: DC
  Administered 2015-08-30: 20000 [IU] via SUBCUTANEOUS

## 2015-08-30 MED ORDER — EPOETIN ALFA 20000 UNIT/ML IJ SOLN
INTRAMUSCULAR | Status: AC
  Administered 2015-08-30: 20000 [IU] via SUBCUTANEOUS
  Filled 2015-08-30: qty 1

## 2015-08-31 ENCOUNTER — Ambulatory Visit (INDEPENDENT_AMBULATORY_CARE_PROVIDER_SITE_OTHER): Payer: Medicare Other | Admitting: Interventional Cardiology

## 2015-08-31 ENCOUNTER — Encounter: Payer: Self-pay | Admitting: Interventional Cardiology

## 2015-08-31 VITALS — BP 135/60 | HR 61 | Ht 64.5 in | Wt 191.0 lb

## 2015-08-31 DIAGNOSIS — I10 Essential (primary) hypertension: Secondary | ICD-10-CM | POA: Diagnosis not present

## 2015-08-31 DIAGNOSIS — I2510 Atherosclerotic heart disease of native coronary artery without angina pectoris: Secondary | ICD-10-CM

## 2015-08-31 DIAGNOSIS — E785 Hyperlipidemia, unspecified: Secondary | ICD-10-CM | POA: Diagnosis not present

## 2015-08-31 DIAGNOSIS — I480 Paroxysmal atrial fibrillation: Secondary | ICD-10-CM | POA: Diagnosis not present

## 2015-08-31 LAB — POCT HEMOGLOBIN-HEMACUE: Hemoglobin: 11.4 g/dL — ABNORMAL LOW (ref 12.0–15.0)

## 2015-08-31 NOTE — Patient Instructions (Signed)
Medication Instructions:  Same-no changes  Labwork: None  Testing/Procedures: None  Follow-Up: Your physician wants you to follow-up in: 1 year. You will receive a reminder letter in the mail two months in advance. If you don't receive a letter, please call our office to schedule the follow-up appointment.      If you need a refill on your cardiac medications before your next appointment, please call your pharmacy.   

## 2015-08-31 NOTE — Progress Notes (Signed)
Patient ID: [name], female   DOB: October 18, 1940, 75 y.o.   MRN: [id number]     Cardiology Office Note   Date:  08/31/2015   ID:  [name], DOB 1941-02-19, MRN [id number]  PCP:  Henrine Screws, MD    No chief complaint on file.  follow-up CAD   Wt Readings from Last 3 Encounters:  08/31/15 191 lb (86.637 kg)  07/13/15 186 lb 8 oz (84.596 kg)  05/10/15 199 lb (90.266 kg)       History of Present Illness: [name] is a 75 y.o. female  with a history of coronary artery disease. She also has renal insufficiency. She had an episode of right-sided weakness and confusion in December. This was concerning for a TIA. She has a history of postoperative atrial fibrillation after bypass surgery several years ago. The concern was then made that perhaps she had paroxysmal atrial fibrillation, and embolism and her TIA symptoms. Her neurologic symptoms have improved significantly. Her mental status is back to normal.  A monitor was placed about a month ago. Preliminary results show no evidence of atrial fibrillation. The thought was that if she did have evidence of atrial fibrillation, she would require anticoagulation, likely with Coumadin due to her renal insufficiency. In the interim, she has only been on aspirin.    Past Medical History  Diagnosis Date  . Anemia   . Glaucoma   . Hypertension   . Blood transfusion   . GERD (gastroesophageal reflux disease)   . Arthritis   . Neuromuscular disorder ([place])     CARPEL TUNNEL  . Atrial fibrillation ([place])   . CAD (coronary artery disease)   . Carotid artery occlusion     Carotid Endartectom,y - left 2009.  Blockage Right being watched by Dr Scot Dock.  Marland Kitchen HOH (hard of hearing)   . Shortness of breath   . Depression   . History of kidney stones     passed  . Carotid stenosis   . Complication of anesthesia     pt. states that she was difficult to wake  . Stroke ([place])     hx of TIA  . 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 Cancer ([place])     .  top of head- melonoma  . Dysrhythmia   . History of pneumonia   . Diabetes mellitus without complication [facility])     Past Surgical History  Procedure Laterality Date  . Carpel tunnel    . Neck surgery    . Esophagogastroduodenoscopy  07/25/2011    Procedure: ESOPHAGOGASTRODUODENOSCOPY (EGD);  Surgeon: Winfield Cunas., MD;  Location: [facility] ENDOSCOPY;  Service: Endoscopy;  Laterality: N/A;  . Colonoscopy  07/25/2011    Procedure: COLONOSCOPY;  Surgeon: Winfield Cunas., MD;  Location: [facility] ENDOSCOPY;  Service: Endoscopy;  Laterality: N/A;  . Coronary artery bypass graft  07/29/2011    Procedure: CORONARY ARTERY BYPASS GRAFTING (CABG);  Surgeon: Gaye Pollack, MD;  Location: [place];  Service: Open Heart Surgery;  Laterality: N/A;  . Carotid endarterectomy Left 2009     CEA  . Av fistula placement Left 05/31/2014    Procedure: Creation of Left Arm arteriovenous brachiocephalic Fistula;  Surgeon: Angelia Mould, MD;  Location: [place];  Service: Vascular;  Laterality: Left;  . Fistulogram Left 08/29/2014    Procedure: FISTULOGRAM;  Surgeon: Angelia Mould, MD;  Location: [facility] CATH LAB;  Service: Cardiovascular;  Laterality:  Left;  . Av fistula placement Left 09/01/2014    Procedure: INSERTION OF ARTERIOVENOUS (AV) GORE-TEX GRAFT LEFT UPPER ARM USING  4-7 MM X 45 CM SRTETCH GORETEX GRAFT;  Surgeon: Angelia Mould, MD;  Location: [place];  Service: Vascular;  Laterality: Left;  . Cardiac catheterization    . Tonsillectomy    . Eye surgery    . Endarterectomy Right 04/21/2015    Procedure: ENDARTERECTOMY CAROTID;  Surgeon: Angelia Mould, MD;  Location: [facility] OR;  Service: Vascular;  Laterality: Right;     Current Outpatient Prescriptions  Medication Sig Dispense Refill  . acetaminophen (TYLENOL) 500 MG tablet Take 500 mg by mouth every 6 (six) hours as needed. For pain    . allopurinol (ZYLOPRIM) 100 MG tablet Take 200 mg by mouth  daily.     Marland Kitchen amLODipine (NORVASC) 10 MG tablet TAKE ONE TABLET BY MOUTH ONE  TIME DAILY 30 tablet 1  . aspirin 325 MG tablet Take 325 mg by mouth daily.    Marland Kitchen atorvastatin (LIPITOR) 10 MG tablet TAKE ONE TABLET BY MOUTH ONE TIME DAILY (Patient taking differently: Take 10 mg by mouth daily. ) 30 tablet 0  . B Complex Vitamins (VITAMIN B COMPLEX PO) Take 1 tablet by mouth daily.    . brimonidine (ALPHAGAN P) 0.1 % SOLN Place 1 drop into the left eye 2 (two) times daily.    . calcitRIOL (ROCALTROL) 0.25 MCG capsule Take 0.25 mcg by mouth every other day.    . Cholecalciferol (VITAMIN D) 2000 UNITS tablet Take 2,000 Units by mouth 2 (two) times daily.    . citalopram (CELEXA) 20 MG tablet Take 20 mg by mouth daily.    . colchicine 0.6 MG tablet Take 0.6 mg by mouth 2 (two) times daily. gout    . fish oil-omega-3 fatty acids 1000 MG capsule Take 2 g by mouth daily.    . furosemide (LASIX) 40 MG tablet Take 80 mg by mouth 2 (two) times daily.     Marland Kitchen HUMALOG 100 UNIT/ML injection Inject 0-12 Units into the skin 3 (three) times daily with meals. Sliding scale    . insulin glargine (LANTUS) 100 UNIT/ML injection Inject 20-30 Units into the skin at bedtime.     Marland Kitchen linagliptin (TRADJENTA) 5 MG TABS tablet Take 1 tablet (5 mg total) by mouth daily with breakfast. 30 tablet 1  . metoprolol (LOPRESSOR) 50 MG tablet Take 50 mg by mouth 2 (two) times daily.    Marland Kitchen omeprazole (PRILOSEC) 20 MG capsule Take 20 mg by mouth daily as needed. Acid reflux    . oxycodone (OXY-IR) 5 MG capsule Take 5 mg by mouth every 6 (six) hours as needed for pain.    Marland Kitchen timolol (TIMOPTIC) 0.5 % ophthalmic solution Place 1 drop into the left eye daily.    . travoprost, benzalkonium, (TRAVATAN) 0.004 % ophthalmic solution Place 1 drop into the left eye at bedtime.     No current facility-administered medications for this visit.    Allergies:   Gabapentin    Social History:  The patient  reports that she has never smoked. She has never  used smokeless tobacco. She reports that she does not drink alcohol or use illicit drugs.   Family History:  The patient's family history includes Cancer in her father; Deep vein thrombosis in her daughter; Diabetes in her daughter and mother; Heart attack in her mother; Heart disease in her daughter and mother; Hyperlipidemia in her daughter and father;  Hypertension in her daughter, father, and mother; Peripheral vascular disease in her daughter.    ROS:  Please see the history of present illness.   Otherwise, review of systems are positive for neurologic symptoms as described above in December 2016, residual right arm soreness.   All other systems are reviewed and negative.    PHYSICAL EXAM: VS:  BP 135/60 mmHg  Pulse 61  Ht 5' 4.5" (1.638 m)  Wt 191 lb (86.637 kg)  BMI 32.29 kg/m2 , BMI Body mass index is 32.29 kg/(m^2). GEN: Well nourished, well developed, in no acute distress HEENT: normal Neck: no JVD, carotid bruits, or masses Cardiac: RRR; no murmurs, rubs, or gallops,no edema  Respiratory:  clear to auscultation bilaterally, normal work of breathing GI: soft, nontender, nondistended, + BS MS: no deformity or atrophy Skin: warm and dry, no rash Neuro:  Strength and sensation are intact Psych: euthymic mood, full affect    Recent Labs: 07/14/2015: ALT 12*; TSH 2.039 07/16/2015: BUN 83*; Creatinine, Ser 2.66*; Platelets 168; Potassium 3.8; Sodium 142 08/30/2015: Hemoglobin 11.4*   Lipid Panel    Component Value Date/Time   CHOL 177 07/13/2015 1630   TRIG 280* 07/13/2015 1630   HDL 40* 07/13/2015 1630   CHOLHDL 4.4 07/13/2015 1630   VLDL 56* 07/13/2015 1630   LDLCALC 81 07/13/2015 1630     Other studies Reviewed: Additional studies/ records that were reviewed today with results demonstrating: monitor results reviewed.   ASSESSMENT AND PLAN:  1. CAD: No angina. Doing well post bypass surgery several years ago. 2. Paroxysmal atrial fibrillation: This was noted after  her bypass surgery. No evidence of recurrent atrial fibrillation by pulmonary monitor results. I don't think we have strong enough evidence to commit her to long-term anticoagulation at this point, unless she had documented atrial fibrillation. She has not had any recurrence of TIA symptoms. I asked her to check her blood pressure at home. If her blood pressure machine gives her an irregular pulse signed, she should let us know. She does check her pulse on a regular basis and has not noted any irregularity. 3. Hyperlipidemia: LDL well controlled in 2016. Triglycerides were elevated, but this was likely a nonfasting sample. 4. HTN: BP well controlled. Continue current medicines.  Will also discuss with Dr. Mercy Moore with whom I spoke about a month ago.   Current medicines are reviewed at length with the patient today.  The patient concerns regarding her medicines were addressed.  The following changes have been made:  No change  Labs/ tests ordered today include:  No orders of the defined types were placed in this encounter.    Recommend 150 minutes/week of aerobic exercise Low fat, low carb, high fiber diet recommended  Disposition:   FU in 1 year   Teresita Madura., MD  08/31/2015 12:27 PM    [facility] [address], [place], [place]  [address] Phone: [phone]; Fax: [phone]

## 2015-09-13 ENCOUNTER — Encounter (HOSPITAL_COMMUNITY)
Admission: RE | Admit: 2015-09-13 | Discharge: 2015-09-13 | Disposition: A | Discharge status: Home / Self Care | Payer: Medicare Other | Source: Ambulatory Visit | Attending: Nephrology | Admitting: Nephrology

## 2015-09-13 DIAGNOSIS — N184 Chronic kidney disease, stage 4 (severe): Secondary | ICD-10-CM | POA: Insufficient documentation

## 2015-09-13 DIAGNOSIS — D631 Anemia in chronic kidney disease: Secondary | ICD-10-CM | POA: Insufficient documentation

## 2015-09-13 LAB — POCT HEMOGLOBIN-HEMACUE: Hemoglobin: 11.8 g/dL — ABNORMAL LOW (ref 12.0–15.0)

## 2015-09-13 MED ORDER — EPOETIN ALFA 20000 UNIT/ML IJ SOLN
20000.0000 [IU] | INTRAMUSCULAR | Status: DC
  Administered 2015-09-13: 20000 [IU] via SUBCUTANEOUS

## 2015-09-13 MED ORDER — EPOETIN ALFA 20000 UNIT/ML IJ SOLN
INTRAMUSCULAR | Status: AC
  Filled 2015-09-13: qty 1

## 2015-09-21 ENCOUNTER — Encounter: Payer: Self-pay | Admitting: Neurology

## 2015-09-21 ENCOUNTER — Ambulatory Visit (INDEPENDENT_AMBULATORY_CARE_PROVIDER_SITE_OTHER): Payer: Medicare Other | Admitting: Neurology

## 2015-09-21 VITALS — BP 126/59 | HR 60 | Ht 64.5 in | Wt 189.4 lb

## 2015-09-21 DIAGNOSIS — E119 Type 2 diabetes mellitus without complications: Secondary | ICD-10-CM

## 2015-09-21 DIAGNOSIS — I10 Essential (primary) hypertension: Secondary | ICD-10-CM

## 2015-09-21 DIAGNOSIS — G9340 Encephalopathy, unspecified: Secondary | ICD-10-CM

## 2015-09-21 DIAGNOSIS — I25709 Atherosclerosis of coronary artery bypass graft(s), unspecified, with unspecified angina pectoris: Secondary | ICD-10-CM | POA: Diagnosis not present

## 2015-09-21 DIAGNOSIS — I2510 Atherosclerotic heart disease of native coronary artery without angina pectoris: Secondary | ICD-10-CM | POA: Diagnosis not present

## 2015-09-21 NOTE — Progress Notes (Signed)
STROKE NEUROLOGY FOLLOW UP NOTE  NAME: [name] DOB: 08-Jul-1941  REASON FOR VISIT: stroke follow up HISTORY FROM: pt and husband and chart  Today we had Debra pleasure of seeing [name] in follow-up at our Neurology Clinic. Pt was accompanied by husband.   History Summary [name] is a 75 y.o. female with history of HTN, DM, CAD s/p CABG with brief afib not on anticoagulation, bilateral carotid artery stenosis and CEAs, and TIA was admitted on 07/13/15 for abnormal speech output and confusion. [name] did not receive IV t-PA due to minimal deficits.MRI negative for stroke. MRA no large vessel occlusion. CUS, TTE, EEG x 2 all unremarkable. LDL 81 and A1C 7.0. Her symptoms were not considered as TIA at that time but more likely encephalopathy and acute delirium due to AKI vs. Possible bacteremia vs. Possible seizure with prolonged post ictal. [name] was treated with vanco with trending down WBC and afebrile. Her AKI also much improved. Her symptoms gradually improved over time and more awake and alert and orientated. [name] was then discharged with continued ASA and lipitor. Of note, [name] did have brief afib s/p CABG procedure but there is no further evidence of further afib as per her cardiologist Dr. Irish Lack, so anticoagulation was not considered on discharge.   Interval History During Debra interval time, [name] has been doing well. No recurrent AMS or seizure or stroke. Check BP at home was good, today BP 126/59. Glucose at home around 170s, not compliant with diabetic diet. Has follow up with Dr. Irish Lack and had 30 day cardiac event monitoring and no afib found. Continued on ASA and lipitor.     REVIEW OF SYSTEMS: Full 14 system review of systems performed and notable only for those listed below and in HPI above, all others are negative:  Constitutional:   Cardiovascular:  Ear/Nose/Throat:  Hearing loss Skin:  Eyes:   Respiratory:   Gastroitestinal:   Genitourinary:    Hematology/Lymphatic:   Endocrine: feeling cold Musculoskeletal:  Joint pain, cramps, aching muscles Allergy/Immunology:   Neurological:  Memory loss, weakness Psychiatric: depression, too much sleep Sleep:   Debra following represents [name]'s updated allergies and side effects list: Allergies  Allergen Reactions  . Gabapentin Other (See Comments)    hallucinations    Debra neurologically relevant items on [name]'s problem list were reviewed on today's visit.  Neurologic Examination  A problem focused neurological exam (12 or more points of Debra single system neurologic examination, vital signs counts as 1 point, cranial nerves count for 8 points) was performed.  Blood pressure 126/59, pulse 60, height 5' 4.5" (1.638 m), weight 189 lb 6.4 oz (85.911 kg).  General - Well nourished, well developed, in no apparent distress.  Ophthalmologic - Fundi not visualized due to small pupils.  Cardiovascular - Regular rate and rhythm with no murmur.  Mental Status -  Level of arousal and orientation to time, place, and person were intact. Language including expression, naming, repetition, comprehension was assessed and found intact. Fund of Knowledge was assessed and was intact.  Cranial Nerves II - XII - II - Visual field intact OU. III, IV, VI - Extraocular movements intact. V - Facial sensation intact bilaterally. VII - Facial movement intact bilaterally. VIII - Hearing & vestibular intact bilaterally. X - Palate elevates symmetrically. XI - Chin turning & shoulder shrug intact bilaterally. XII - Tongue protrusion intact.  Motor Strength - [name]'s strength was normal in all extremities and  pronator drift was absent.  Bulk was normal and fasciculations were absent.   Motor Tone - Muscle tone was assessed at Debra neck and appendages and was normal.  Reflexes - [name]'s reflexes were 1+ in all extremities and [name] had no pathological reflexes.  Sensory - Light  touch, temperature/pinprick were assessed and were normal.    Coordination - [name] had normal movements in Debra hands and feet with no ataxia or dysmetria.  Tremor was absent.  Gait and Station - slow, small stride, mild stooped posturing.  Data reviewed: I personally reviewed Debra images and agree with Debra radiology interpretations.  Ct Head Wo Contrast 07/13/2015 No acute intracranial abnormality noted.   MRI HEAD 07/13/2015 : No acute intracranial process, specifically no acute ischemia on this mildly motion degraded examination. Negative noncontrast MRI brain for age.   MRA HEAD 07/13/2015 No acute large vessel occlusion or definite high-grade stenosis on this moderately motion degraded examination.   EEG 07/15/15 - This EEG is abnormal with mild nonspecific generalized slowing of cerebral activity which can be seen with metabolic as well as toxic and degenerative encephalopathies. No evidence of an epileptic disorder was demonstrated. Compared to previous study on 07/14/2015, there is slight improvement in background cerebral activity.  EEG 07/14/15- this is an abnormal awake and asleep EEG that demonstrated findings consistent with a mild non specific encephalopathy. No electrographic seizures seen.  CUS - Findings suggest 1-39% internal carotid artery stenosis bilaterally. Debra left vertebral artery is patent with antegrade flow. Unable to visualize Debra right vertebral artery.  TTE - - Left ventricle: Debra cavity size was normal. Wall thickness was normal. Systolic function was normal. Debra estimated ejection fraction was in Debra range of 55% to 60%. - Mitral valve: Calcified annulus. Mildly thickened leaflets . - Left atrium: Debra atrium was mildly dilated. - Atrial septum: No defect or patent foramen ovale was identified.  30 day cardia event monitoring - no afib  Component     Latest Ref Rng 07/13/2015 07/14/2015  Cholesterol     0 - 200 mg/dL 177     Triglycerides     <150 mg/dL 280 (H)   HDL Cholesterol     >40 mg/dL 40 (L)   Total CHOL/HDL Ratio      4.4   VLDL     0 - 40 mg/dL 56 (H)   LDL (calc)     0 - 99 mg/dL 81   Hemoglobin A1C     4.8 - 5.6 % 7.0 (H)   Mean Plasma Glucose      154   Ammonia     9 - 35 umol/L  14  TSH     0.350 - 4.500 uIU/mL  2.039  T4,Free(Direct)     0.61 - 1.12 ng/dL  0.77  Vitamin B12     180 - 914 pg/mL  4206 (H)  RPR     Non Reactive  Non Reactive  HIV     Non Reactive  Non Reactive  Thyroglobulin Antibody     0.0 - 0.9 IU/mL  <1.0  Thyroperoxidase Ab SerPl-aCnc     0 - 34 IU/mL  21    Assessment: As you may recall, [name] is a 75 y.o. Caucasian female with PMH of HTN, DM, CAD s/p CABG with brief afib not on anticoagulation, bilateral carotid artery stenosis and CEAs, and TIA was admitted on 07/13/15 for abnormal speech and confusion. MRI negative for stroke. MRA no large vessel occlusion.  CUS, TTE, EEG x 2 all unremarkable. LDL 81 and A1C 7.0. Her symptoms were more likely encephalopathy and acute delirium due to AKI vs. Possible bacteremia vs. Possible seizure with prolonged post ictal. EEG negative. Her symptoms gradually improved over time and more awake and alert and orientated. [name] was then discharged with continued ASA and lipitor. Of note, [name] did have brief afib s/p CABG procedure but there is no evidence of further afib. [name] follow up with Dr. Irish Lack and had 30 day cardiac event monitoring and no afib found.   Plan:  - continue ASA and lipitor for stroke prevention - check BP and glucose at home - follow up with Cardiology - Follow up with your primary care physician for stroke risk factor modification. Recommend maintain blood pressure goal <130/80, diabetes with hemoglobin A1c goal below 6.5% and lipids with LDL cholesterol goal below 70 mg/dL.  - diabetic diet and regular exercise - follow up in 4 months.  I spent more than 25 minutes of face to face time with [name].  Greater than 50% of time was spent in counseling and coordination of care.  No orders of Debra defined types were placed in this encounter.    Meds ordered this encounter  Medications  . DISCONTD: gabapentin (NEURONTIN) 300 MG capsule    Sig: Take 300 mg by mouth at bedtime. Reported on 09/21/2015  . Cyanocobalamin (VITAMIN B 12 PO)    Sig: Take 1,000 mg by mouth.    Espinoza Instructions  - continue ASA and lipitor for stroke prevention - check BP and glucose at home - follow up with Cardiology - Follow up with your primary care physician for stroke risk factor modification. Recommend maintain blood pressure goal <130/80, diabetes with hemoglobin A1c goal below 6.5% and lipids with LDL cholesterol goal below 70 mg/dL.  - diabetic diet and regular exercise - follow up in 4 months.     Rosalin Hawking, MD PhD [facility] [address] [place], [place] [address] [phone]

## 2015-09-21 NOTE — Patient Instructions (Addendum)
-   continue ASA and lipitor for stroke prevention - check BP and glucose at home - follow up with Cardiology - Follow up with your primary care physician for stroke risk factor modification. Recommend maintain blood pressure goal <130/80, diabetes with hemoglobin A1c goal below 6.5% and lipids with LDL cholesterol goal below 70 mg/dL.  - diabetic diet and regular exercise - follow up in 4 months.

## 2015-09-26 DIAGNOSIS — N184 Chronic kidney disease, stage 4 (severe): Secondary | ICD-10-CM | POA: Diagnosis not present

## 2015-09-26 DIAGNOSIS — I129 Hypertensive chronic kidney disease with stage 1 through stage 4 chronic kidney disease, or unspecified chronic kidney disease: Secondary | ICD-10-CM | POA: Diagnosis not present

## 2015-09-26 DIAGNOSIS — D631 Anemia in chronic kidney disease: Secondary | ICD-10-CM | POA: Diagnosis not present

## 2015-09-26 DIAGNOSIS — N2581 Secondary hyperparathyroidism of renal origin: Secondary | ICD-10-CM | POA: Diagnosis not present

## 2015-09-27 ENCOUNTER — Encounter (HOSPITAL_COMMUNITY)
Admission: RE | Admit: 2015-09-27 | Discharge: 2015-09-27 | Disposition: A | Discharge status: Home / Self Care | Payer: Medicare Other | Source: Ambulatory Visit | Attending: Nephrology | Admitting: Nephrology

## 2015-09-27 DIAGNOSIS — N184 Chronic kidney disease, stage 4 (severe): Secondary | ICD-10-CM | POA: Diagnosis not present

## 2015-09-27 DIAGNOSIS — D631 Anemia in chronic kidney disease: Secondary | ICD-10-CM | POA: Diagnosis not present

## 2015-09-27 LAB — POCT HEMOGLOBIN-HEMACUE: HEMOGLOBIN: 11.8 g/dL — AB (ref 12.0–15.0)

## 2015-09-27 MED ORDER — EPOETIN ALFA 20000 UNIT/ML IJ SOLN
20000.0000 [IU] | INTRAMUSCULAR | Status: DC
  Administered 2015-09-27: 20000 [IU] via SUBCUTANEOUS

## 2015-09-27 MED ORDER — EPOETIN ALFA 20000 UNIT/ML IJ SOLN
INTRAMUSCULAR | Status: AC
  Filled 2015-09-27: qty 1

## 2015-10-04 DIAGNOSIS — M4806 Spinal stenosis, lumbar region: Secondary | ICD-10-CM | POA: Diagnosis not present

## 2015-10-04 DIAGNOSIS — M5416 Radiculopathy, lumbar region: Secondary | ICD-10-CM | POA: Diagnosis not present

## 2015-10-06 ENCOUNTER — Other Ambulatory Visit: Payer: Self-pay | Admitting: Orthopaedic Surgery

## 2015-10-06 DIAGNOSIS — M545 Low back pain: Secondary | ICD-10-CM

## 2015-10-11 ENCOUNTER — Encounter (HOSPITAL_COMMUNITY)
Admission: RE | Admit: 2015-10-11 | Discharge: 2015-10-11 | Disposition: A | Discharge status: Home / Self Care | Payer: Medicare Other | Source: Ambulatory Visit | Attending: Nephrology | Admitting: Nephrology

## 2015-10-11 DIAGNOSIS — D631 Anemia in chronic kidney disease: Secondary | ICD-10-CM | POA: Diagnosis not present

## 2015-10-11 DIAGNOSIS — N184 Chronic kidney disease, stage 4 (severe): Secondary | ICD-10-CM | POA: Diagnosis not present

## 2015-10-11 LAB — POCT HEMOGLOBIN-HEMACUE: HEMOGLOBIN: 12 g/dL (ref 12.0–15.0)

## 2015-10-11 MED ORDER — EPOETIN ALFA 20000 UNIT/ML IJ SOLN: 20000.0000 [IU] | INTRAMUSCULAR | Status: DC

## 2015-10-14 ENCOUNTER — Ambulatory Visit
Admission: RE | Admit: 2015-10-14 | Discharge: 2015-10-14 | Disposition: A | Discharge status: Home / Self Care | Payer: Medicare Other | Source: Ambulatory Visit | Attending: Orthopaedic Surgery | Admitting: Orthopaedic Surgery

## 2015-10-14 DIAGNOSIS — M5126 Other intervertebral disc displacement, lumbar region: Secondary | ICD-10-CM | POA: Diagnosis not present

## 2015-10-14 DIAGNOSIS — M545 Low back pain: Secondary | ICD-10-CM

## 2015-10-16 ENCOUNTER — Other Ambulatory Visit: Payer: Self-pay | Admitting: Interventional Cardiology

## 2015-10-24 DIAGNOSIS — M4806 Spinal stenosis, lumbar region: Secondary | ICD-10-CM | POA: Diagnosis not present

## 2015-10-24 DIAGNOSIS — M5416 Radiculopathy, lumbar region: Secondary | ICD-10-CM | POA: Diagnosis not present

## 2015-10-24 DIAGNOSIS — M25552 Pain in left hip: Secondary | ICD-10-CM | POA: Diagnosis not present

## 2015-10-25 ENCOUNTER — Inpatient Hospital Stay (HOSPITAL_COMMUNITY): Admission: RE | Admit: 2015-10-25 | Payer: Medicare Other | Source: Ambulatory Visit

## 2015-10-26 ENCOUNTER — Telehealth: Payer: Self-pay

## 2015-10-26 NOTE — Telephone Encounter (Signed)
The Pt is schedule to have L5-S, decompression, Bilateral Microdiscectomy in the near future with Dr Rodell Perna at [facility]. They are requesting:  1. Cardiac clearance  2. Any special instructions for the pt.  FYI: according to the pts chart she is taking Aspirin 325 mg daily. Please advise.

## 2015-10-29 NOTE — Telephone Encounter (Signed)
No special instructions.  Surgeon will instruct if she has to hold aspirin.

## 2015-10-30 NOTE — Telephone Encounter (Signed)
**Note De-Identified  Obfuscation** This message has been faxed to Dr Rodell Perna through the Epic system.

## 2015-11-01 ENCOUNTER — Encounter: Payer: Self-pay | Admitting: Vascular Surgery

## 2015-11-01 ENCOUNTER — Encounter (HOSPITAL_COMMUNITY)
Admission: RE | Admit: 2015-11-01 | Discharge: 2015-11-01 | Disposition: A | Discharge status: Home / Self Care | Payer: Medicare Other | Source: Ambulatory Visit | Attending: Nephrology | Admitting: Nephrology

## 2015-11-01 DIAGNOSIS — N184 Chronic kidney disease, stage 4 (severe): Secondary | ICD-10-CM | POA: Insufficient documentation

## 2015-11-01 DIAGNOSIS — D631 Anemia in chronic kidney disease: Secondary | ICD-10-CM | POA: Insufficient documentation

## 2015-11-01 LAB — IRON AND TIBC
Iron: 85 ug/dL (ref 28–170)
Saturation Ratios: 24 % (ref 10.4–31.8)
TIBC: 347 ug/dL (ref 250–450)
UIBC: 262 ug/dL

## 2015-11-01 LAB — FERRITIN: FERRITIN: 195 ng/mL (ref 11–307)

## 2015-11-01 LAB — POCT HEMOGLOBIN-HEMACUE: HEMOGLOBIN: 11.7 g/dL — AB (ref 12.0–15.0)

## 2015-11-01 MED ORDER — EPOETIN ALFA 20000 UNIT/ML IJ SOLN
20000.0000 [IU] | INTRAMUSCULAR | Status: DC
  Administered 2015-11-01: 20000 [IU] via SUBCUTANEOUS

## 2015-11-01 MED ORDER — EPOETIN ALFA 20000 UNIT/ML IJ SOLN
INTRAMUSCULAR | Status: AC
  Administered 2015-11-01: 20000 [IU] via SUBCUTANEOUS
  Filled 2015-11-01: qty 1

## 2015-11-06 DIAGNOSIS — L570 Actinic keratosis: Secondary | ICD-10-CM | POA: Diagnosis not present

## 2015-11-06 DIAGNOSIS — L820 Inflamed seborrheic keratosis: Secondary | ICD-10-CM | POA: Diagnosis not present

## 2015-11-06 DIAGNOSIS — L680 Hirsutism: Secondary | ICD-10-CM | POA: Diagnosis not present

## 2015-11-08 ENCOUNTER — Ambulatory Visit: Payer: Medicare Other | Admitting: Vascular Surgery

## 2015-11-08 ENCOUNTER — Ambulatory Visit (HOSPITAL_COMMUNITY)
Admission: RE | Admit: 2015-11-08 | Discharge: 2015-11-08 | Disposition: A | Discharge status: Home / Self Care | Payer: Medicare Other | Source: Ambulatory Visit | Attending: Vascular Surgery | Admitting: Vascular Surgery

## 2015-11-08 ENCOUNTER — Encounter: Payer: Self-pay | Admitting: Vascular Surgery

## 2015-11-08 ENCOUNTER — Encounter (HOSPITAL_COMMUNITY): Payer: Medicare Other

## 2015-11-08 ENCOUNTER — Ambulatory Visit (INDEPENDENT_AMBULATORY_CARE_PROVIDER_SITE_OTHER): Payer: Medicare Other | Admitting: Vascular Surgery

## 2015-11-08 VITALS — BP 148/61 | HR 62 | Temp 97.6°F | Resp 18 | Ht 64.0 in | Wt 184.0 lb

## 2015-11-08 DIAGNOSIS — E1122 Type 2 diabetes mellitus with diabetic chronic kidney disease: Secondary | ICD-10-CM | POA: Diagnosis not present

## 2015-11-08 DIAGNOSIS — N184 Chronic kidney disease, stage 4 (severe): Secondary | ICD-10-CM | POA: Insufficient documentation

## 2015-11-08 DIAGNOSIS — K219 Gastro-esophageal reflux disease without esophagitis: Secondary | ICD-10-CM | POA: Insufficient documentation

## 2015-11-08 DIAGNOSIS — Z48812 Encounter for surgical aftercare following surgery on the circulatory system: Secondary | ICD-10-CM | POA: Diagnosis not present

## 2015-11-08 DIAGNOSIS — I129 Hypertensive chronic kidney disease with stage 1 through stage 4 chronic kidney disease, or unspecified chronic kidney disease: Secondary | ICD-10-CM | POA: Insufficient documentation

## 2015-11-08 DIAGNOSIS — I2510 Atherosclerotic heart disease of native coronary artery without angina pectoris: Secondary | ICD-10-CM | POA: Diagnosis not present

## 2015-11-08 DIAGNOSIS — I6523 Occlusion and stenosis of bilateral carotid arteries: Secondary | ICD-10-CM | POA: Diagnosis not present

## 2015-11-08 NOTE — Progress Notes (Signed)
Filed Vitals:   11/08/15 1154 11/08/15 1155  BP: 147/67 148/61  Pulse: 62   Temp: 97.6 F (36.4 C)   TempSrc: Oral   Resp: 18   Height: 5\' 4"  (1.626 m)   Weight: 184 lb (83.462 kg)   SpO2: 99%

## 2015-11-08 NOTE — Progress Notes (Signed)
Vascular and Vein Specialist of [facility] PATIENT  Patient name: [name] MRN: [id number] DOB: 15-Aug-1940 Sex: female  REASON FOR VISIT: Follow up of carotid disease.  HPI: [name] is a 75 y.o. female who I last saw on 05/10/2015. She is undergone bilateral carotid endarterectomies. Left carotid endarterectomy was in 2009. The right carotid endarterectomy was in October 2016.  She comes in for six-month follow up visit. She denies any history of stroke, TIAs, expressive or receptive aphasia, or amaurosis fugax. Has been having some back problems and is being considered for back surgery.  Past Medical History  Diagnosis Date  . Anemia   . Glaucoma   . Hypertension   . Blood transfusion   . GERD (gastroesophageal reflux disease)   . Arthritis   . Neuromuscular disorder ([place])     CARPEL TUNNEL  . Atrial fibrillation ([place])   . CAD (coronary artery disease)   . Carotid artery occlusion     Carotid Endartectom,y - left 2009.  Blockage Right being watched by Dr Scot Dock.  Marland Kitchen HOH (hard of hearing)   . Shortness of breath   . Depression   . History of kidney stones     passed  . Carotid stenosis   . Complication of anesthesia     pt. states that she was difficult to wake  . Stroke ([place])     hx of TIA  . 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 Cancer ([place])     .  top of head- melonoma  . Dysrhythmia   . History of pneumonia   . Diabetes mellitus without complication ([place])     Family History  Problem Relation Age of Onset  . Diabetes Mother   . Hypertension Mother   . Heart disease Mother     beofre age 65  . Heart attack Mother   . Stroke Mother   . Cancer Father   . Hyperlipidemia Father   . Hypertension Father   . Deep vein thrombosis Daughter   . Diabetes Daughter   . Hyperlipidemia Daughter   . Hypertension Daughter   . Heart disease Daughter   . Peripheral vascular disease Daughter     SOCIAL HISTORY: Social History    Substance Use Topics  . Smoking status: Never Smoker   . Smokeless tobacco: Never Used  . Alcohol Use: No    Allergies  Allergen Reactions  . Gabapentin Other (See Comments)    hallucinations    Current Outpatient Prescriptions  Medication Sig Dispense Refill  . acetaminophen (TYLENOL) 500 MG tablet Take 500 mg by mouth every 6 (six) hours as needed. For pain    . allopurinol (ZYLOPRIM) 100 MG tablet Take 200 mg by mouth daily.     Marland Kitchen amLODipine (NORVASC) 10 MG tablet TAKE ONE TABLET BY MOUTH ONE TIME DAILY 30 tablet 9  . aspirin 325 MG tablet Take 325 mg by mouth daily.    Marland Kitchen atorvastatin (LIPITOR) 10 MG tablet TAKE ONE TABLET BY MOUTH ONE TIME DAILY (Patient taking differently: Take 10 mg by mouth daily. ) 30 tablet 0  . B Complex Vitamins (VITAMIN B COMPLEX PO) Take 1 tablet by mouth daily.    . brimonidine (ALPHAGAN P) 0.1 % SOLN Place 1 drop into the left eye 2 (two) times daily.    . calcitRIOL (ROCALTROL) 0.25 MCG capsule Take 0.25 mcg by mouth every other day.    . Cholecalciferol (VITAMIN  D) 2000 UNITS tablet Take 2,000 Units by mouth 2 (two) times daily.    . citalopram (CELEXA) 20 MG tablet Take 20 mg by mouth daily.    . colchicine 0.6 MG tablet Take 0.6 mg by mouth 2 (two) times daily. gout    . Cyanocobalamin (VITAMIN B 12 PO) Take 1,000 mg by mouth.    . fish oil-omega-3 fatty acids 1000 MG capsule Take 2 g by mouth daily.    . furosemide (LASIX) 40 MG tablet Take 80 mg by mouth 2 (two) times daily.     Marland Kitchen HUMALOG 100 UNIT/ML injection Inject 0-12 Units into the skin 3 (three) times daily with meals. Sliding scale    . insulin glargine (LANTUS) 100 UNIT/ML injection Inject 20-30 Units into the skin at bedtime.     Marland Kitchen linagliptin (TRADJENTA) 5 MG TABS tablet Take 1 tablet (5 mg total) by mouth daily with breakfast. 30 tablet 1  . metoprolol (LOPRESSOR) 50 MG tablet Take 50 mg by mouth 2 (two) times daily.    Marland Kitchen omeprazole (PRILOSEC) 20 MG capsule Take 20 mg by mouth daily  as needed. Acid reflux    . oxycodone (OXY-IR) 5 MG capsule Take 5 mg by mouth every 6 (six) hours as needed for pain.    Marland Kitchen timolol (TIMOPTIC) 0.5 % ophthalmic solution Place 1 drop into the left eye daily.    . travoprost, benzalkonium, (TRAVATAN) 0.004 % ophthalmic solution Place 1 drop into the left eye at bedtime.     No current facility-administered medications for this visit.    REVIEW OF SYSTEMS:  [X]  denotes positive finding, [ ]  denotes negative finding Cardiac  Comments:  Chest pain or chest pressure:    Shortness of breath upon exertion:    Short of breath when lying flat:    Irregular heart rhythm:        Vascular    Pain in calf, thigh, or hip brought on by ambulation: X   Pain in feet at night that wakes you up from your sleep:     Blood clot in your veins:    Leg swelling:         Pulmonary    Oxygen at home:    Productive cough:     Wheezing:         Neurologic    Sudden weakness in arms or legs:     Sudden numbness in arms or legs:     Sudden onset of difficulty speaking or slurred speech:    Temporary loss of vision in one eye:     Problems with dizziness:         Gastrointestinal    Blood in stool:     Vomited blood:         Genitourinary    Burning when urinating:     Blood in urine:        Psychiatric    Major depression:         Hematologic    Bleeding problems:    Problems with blood clotting too easily:        Skin    Rashes or ulcers:        Constitutional    Fever or chills:      PHYSICAL EXAM: Filed Vitals:   11/08/15 1154 11/08/15 1155  BP: 147/67 148/61  Pulse: 62   Temp: 97.6 F (36.4 C)   TempSrc: Oral   Resp: 18   Height: 5\' 4"  (1.626 m)  Weight: 184 lb (83.462 kg)   SpO2: 99%     GENERAL: The patient is a well-nourished female, in no acute distress. The vital signs are documented above. CARDIAC: There is a regular rate and rhythm.  VASCULAR: I do not detect carotid bruits. She has mild bilateral lower extremity  swelling. PULMONARY: There is good air exchange bilaterally without wheezing or rales. ABDOMEN: Soft and non-tender with normal pitched bowel sounds.  MUSCULOSKELETAL: There are no major deformities or cyanosis. NEUROLOGIC: No focal weakness or paresthesias are detected. SKIN: There are no ulcers or rashes noted. PSYCHIATRIC: The patient has a normal affect.  DATA:   CAROTID DUPLEX: I have independently interpreted her carotid duplex can today. This shows that both carotid endarterectomy sites are widely patent. Vertebral arteries are patent with antegrade flow.  MEDICAL ISSUES:  STATUS POST BILATERAL CAROTID ENDARTERECTOMIES: The patient is status post bilateral carotid endarterectomies. She is asymptomatic. Duplex can today shows that both sites are widely patent. She will be due for a V/Q I follow up visit in 1 year and I'll see her back at that time. She is on aspirin and is on a statin. She is not a smoker. She knows to call sooner she has problems.  [name] [facility] of [place]: [phone]

## 2015-11-15 DIAGNOSIS — N184 Chronic kidney disease, stage 4 (severe): Secondary | ICD-10-CM | POA: Diagnosis not present

## 2015-11-15 DIAGNOSIS — D81818 Other biotin-dependent carboxylase deficiency: Secondary | ICD-10-CM | POA: Diagnosis not present

## 2015-11-15 DIAGNOSIS — M545 Low back pain: Secondary | ICD-10-CM | POA: Diagnosis not present

## 2015-11-15 DIAGNOSIS — E559 Vitamin D deficiency, unspecified: Secondary | ICD-10-CM | POA: Diagnosis not present

## 2015-11-15 DIAGNOSIS — E11319 Type 2 diabetes mellitus with unspecified diabetic retinopathy without macular edema: Secondary | ICD-10-CM | POA: Diagnosis not present

## 2015-11-15 DIAGNOSIS — K219 Gastro-esophageal reflux disease without esophagitis: Secondary | ICD-10-CM | POA: Diagnosis not present

## 2015-11-15 DIAGNOSIS — I25111 Atherosclerotic heart disease of native coronary artery with angina pectoris with documented spasm: Secondary | ICD-10-CM | POA: Diagnosis not present

## 2015-11-15 DIAGNOSIS — E0840 Diabetes mellitus due to underlying condition with diabetic neuropathy, unspecified: Secondary | ICD-10-CM | POA: Diagnosis not present

## 2015-11-15 DIAGNOSIS — E1121 Type 2 diabetes mellitus with diabetic nephropathy: Secondary | ICD-10-CM | POA: Diagnosis not present

## 2015-11-15 DIAGNOSIS — Z794 Long term (current) use of insulin: Secondary | ICD-10-CM | POA: Diagnosis not present

## 2015-11-17 ENCOUNTER — Other Ambulatory Visit: Payer: Self-pay

## 2015-11-17 DIAGNOSIS — Z1231 Encounter for screening mammogram for malignant neoplasm of breast: Secondary | ICD-10-CM

## 2015-11-22 ENCOUNTER — Encounter (HOSPITAL_COMMUNITY): Payer: Medicare Other

## 2015-11-23 ENCOUNTER — Encounter (HOSPITAL_COMMUNITY): Payer: Self-pay

## 2015-11-23 ENCOUNTER — Encounter (HOSPITAL_COMMUNITY)
Admission: RE | Admit: 2015-11-23 | Discharge: 2015-11-23 | Disposition: A | Discharge status: Home / Self Care | Payer: Medicare Other | Source: Ambulatory Visit | Attending: Orthopaedic Surgery | Admitting: Orthopaedic Surgery

## 2015-11-23 ENCOUNTER — Ambulatory Visit (HOSPITAL_COMMUNITY)
Admission: RE | Admit: 2015-11-23 | Discharge: 2015-11-23 | Disposition: A | Discharge status: Home / Self Care | Payer: Medicare Other | Source: Ambulatory Visit | Attending: Surgery | Admitting: Surgery

## 2015-11-23 ENCOUNTER — Ambulatory Visit (HOSPITAL_COMMUNITY)
Admission: RE | Admit: 2015-11-23 | Discharge: 2015-11-23 | Disposition: A | Discharge status: Home / Self Care | Payer: Medicare Other | Source: Ambulatory Visit | Attending: Nephrology | Admitting: Nephrology

## 2015-11-23 DIAGNOSIS — N184 Chronic kidney disease, stage 4 (severe): Secondary | ICD-10-CM | POA: Insufficient documentation

## 2015-11-23 DIAGNOSIS — Z01812 Encounter for preprocedural laboratory examination: Secondary | ICD-10-CM | POA: Insufficient documentation

## 2015-11-23 DIAGNOSIS — E1122 Type 2 diabetes mellitus with diabetic chronic kidney disease: Secondary | ICD-10-CM | POA: Insufficient documentation

## 2015-11-23 DIAGNOSIS — Z8673 Personal history of transient ischemic attack (TIA), and cerebral infarction without residual deficits: Secondary | ICD-10-CM | POA: Diagnosis not present

## 2015-11-23 DIAGNOSIS — I517 Cardiomegaly: Secondary | ICD-10-CM | POA: Diagnosis not present

## 2015-11-23 DIAGNOSIS — Z794 Long term (current) use of insulin: Secondary | ICD-10-CM | POA: Insufficient documentation

## 2015-11-23 DIAGNOSIS — F329 Major depressive disorder, single episode, unspecified: Secondary | ICD-10-CM | POA: Diagnosis not present

## 2015-11-23 DIAGNOSIS — Z01818 Encounter for other preprocedural examination: Secondary | ICD-10-CM | POA: Diagnosis not present

## 2015-11-23 DIAGNOSIS — D631 Anemia in chronic kidney disease: Secondary | ICD-10-CM | POA: Insufficient documentation

## 2015-11-23 DIAGNOSIS — M4807 Spinal stenosis, lumbosacral region: Secondary | ICD-10-CM | POA: Insufficient documentation

## 2015-11-23 DIAGNOSIS — I120 Hypertensive chronic kidney disease with stage 5 chronic kidney disease or end stage renal disease: Secondary | ICD-10-CM | POA: Diagnosis not present

## 2015-11-23 DIAGNOSIS — Z85820 Personal history of malignant melanoma of skin: Secondary | ICD-10-CM | POA: Diagnosis not present

## 2015-11-23 DIAGNOSIS — Z79899 Other long term (current) drug therapy: Secondary | ICD-10-CM | POA: Diagnosis not present

## 2015-11-23 DIAGNOSIS — Z951 Presence of aortocoronary bypass graft: Secondary | ICD-10-CM | POA: Diagnosis not present

## 2015-11-23 DIAGNOSIS — I2510 Atherosclerotic heart disease of native coronary artery without angina pectoris: Secondary | ICD-10-CM | POA: Insufficient documentation

## 2015-11-23 DIAGNOSIS — K219 Gastro-esophageal reflux disease without esophagitis: Secondary | ICD-10-CM | POA: Insufficient documentation

## 2015-11-23 DIAGNOSIS — Z5181 Encounter for therapeutic drug level monitoring: Secondary | ICD-10-CM | POA: Insufficient documentation

## 2015-11-23 HISTORY — DX: Personal history of other diseases of the digestive system: Z87.19

## 2015-11-23 HISTORY — DX: Acute myocardial infarction, unspecified: I21.9

## 2015-11-23 HISTORY — DX: Pneumonia, unspecified organism: J18.9

## 2015-11-23 LAB — CBC
HEMATOCRIT: 35.2 % — AB (ref 36.0–46.0)
HEMOGLOBIN: 11.5 g/dL — AB (ref 12.0–15.0)
MCH: 31.7 pg (ref 26.0–34.0)
MCHC: 32.7 g/dL (ref 30.0–36.0)
MCV: 97 fL (ref 78.0–100.0)
Platelets: 174 10*3/uL (ref 150–400)
RBC: 3.63 MIL/uL — AB (ref 3.87–5.11)
RDW: 15.6 % — ABNORMAL HIGH (ref 11.5–15.5)
WBC: 8.5 10*3/uL (ref 4.0–10.5)

## 2015-11-23 LAB — COMPREHENSIVE METABOLIC PANEL
ALK PHOS: 53 U/L (ref 38–126)
ALT: 14 U/L (ref 14–54)
ANION GAP: 15 (ref 5–15)
AST: 18 U/L (ref 15–41)
Albumin: 3.6 g/dL (ref 3.5–5.0)
BILIRUBIN TOTAL: 0.5 mg/dL (ref 0.3–1.2)
BUN: 77 mg/dL — ABNORMAL HIGH (ref 6–20)
CALCIUM: 9.1 mg/dL (ref 8.9–10.3)
CO2: 22 mmol/L (ref 22–32)
CREATININE: 2.74 mg/dL — AB (ref 0.44–1.00)
Chloride: 104 mmol/L (ref 101–111)
GFR, EST AFRICAN AMERICAN: 18 mL/min — AB (ref >60)
GFR, EST NON AFRICAN AMERICAN: 16 mL/min — AB (ref >60)
Glucose, Bld: 134 mg/dL — ABNORMAL HIGH (ref 65–99)
Potassium: 3.6 mmol/L (ref 3.5–5.1)
Sodium: 141 mmol/L (ref 135–145)
TOTAL PROTEIN: 6.7 g/dL (ref 6.5–8.1)

## 2015-11-23 LAB — URINE MICROSCOPIC-ADD ON: RBC / HPF: NONE SEEN RBC/hpf (ref 0–5)

## 2015-11-23 LAB — APTT: aPTT: 31 seconds (ref 24–37)

## 2015-11-23 LAB — GLUCOSE, CAPILLARY: Glucose-Capillary: 141 mg/dL — ABNORMAL HIGH (ref 65–99)

## 2015-11-23 LAB — URINALYSIS, ROUTINE W REFLEX MICROSCOPIC
BILIRUBIN URINE: NEGATIVE
GLUCOSE, UA: NEGATIVE mg/dL
HGB URINE DIPSTICK: NEGATIVE
Ketones, ur: NEGATIVE mg/dL
Leukocytes, UA: NEGATIVE
Nitrite: NEGATIVE
PROTEIN: 100 mg/dL — AB
Specific Gravity, Urine: 1.019 (ref 1.005–1.030)
pH: 5 (ref 5.0–8.0)

## 2015-11-23 LAB — SURGICAL PCR SCREEN
MRSA, PCR: NEGATIVE
Staphylococcus aureus: NEGATIVE

## 2015-11-23 LAB — PROTIME-INR
INR: 1.02 (ref 0.00–1.49)
Prothrombin Time: 13.7 seconds (ref 11.6–15.2)

## 2015-11-23 MED ORDER — EPOETIN ALFA 20000 UNIT/ML IJ SOLN
20000.0000 [IU] | INTRAMUSCULAR | Status: DC
  Administered 2015-11-23: 20000 [IU] via SUBCUTANEOUS

## 2015-11-23 MED ORDER — EPOETIN ALFA 20000 UNIT/ML IJ SOLN
INTRAMUSCULAR | Status: AC
  Filled 2015-11-23: qty 1

## 2015-11-23 NOTE — Progress Notes (Addendum)
PCP is Dr. Josetta Huddle Cardiologist is Dr. Irish Lack Kidney Dr. Is Dr. Mercy Moore Reports her fasting CBG usually 116 or so Echo noted in epic from 07-14-2015 She states she doesn't remember having a stress test. Card cath noted in epic from 07-24-3011

## 2015-11-23 NOTE — Pre-Procedure Instructions (Addendum)
[name]  11/23/2015      [place], [place] - [address] [place] [address] Phone: [phone] Fax: [phone]  [id number] - [place], [place] [address] Phone: [phone] Fax: [phone]    Your procedure is scheduled on May 19  Report to [facility] at 820 A.M.  Call this number if you have problems the morning of surgery:  [phone]   Remember:  Do not eat food or drink liquids after midnight.  Take these medicines the morning of surgery with A SIP OF WATER tylenol if needed, allopurinol (Zyloprim), amlodipine (Norvasc), Alphagan eye drops, citalopram (Celexa), Metoprolol (Lopressor), Omeprazole (Prilosec) if needed, Oxycodone (OXy-IR) if needed, timoptic eye drops   Stop taking aspirin, BC's, Goody's, Herbal medications, Fish Oil, Ibuprofen, Advil, Motrin, Aleve, vitamins   How to Manage Your Diabetes Before and After Surgery  Why is it important to control my blood sugar before and after surgery? . Improving blood sugar levels before and after surgery helps healing and can limit problems. . A way of improving blood sugar control is eating a healthy diet by: o  Eating less sugar and carbohydrates o  Increasing activity/exercise o  Talking with your doctor about reaching your blood sugar goals . High blood sugars (greater than 180 mg/dL) can raise your risk of infections and slow your recovery, so you will need to focus on controlling your diabetes during the weeks before surgery. . Make sure that the doctor who takes care of your diabetes knows about your planned surgery including the date and location.  How do I manage my blood sugar before surgery? . Check your blood sugar at least 4 times a day, starting 2 days before surgery, to make sure that the level is not too high or low. o Check your blood sugar the morning of your surgery  when you wake up and every 2 hours until you get to the Short Stay unit. . If your blood sugar is less than 70 mg/dL, you will need to treat for low blood sugar: o Do not take insulin. o Treat a low blood sugar (less than 70 mg/dL) with  cup of clear juice (cranberry or apple), 4 glucose tablets, OR glucose gel. o Recheck blood sugar in 15 minutes after treatment (to make sure it is greater than 70 mg/dL). If your blood sugar is not greater than 70 mg/dL on recheck, call [phone] for further instructions. . Report your blood sugar to the short stay nurse when you get to Short Stay.  . If you are admitted to the hospital after surgery: o Your blood sugar will be checked by the staff and you will probably be given insulin after surgery (instead of oral diabetes medicines) to make sure you have good blood sugar levels. o The goal for blood sugar control after surgery is 80-180 mg/dL.              WHAT DO I DO ABOUT MY DIABETES MEDICATION?   Marland Kitchen Do not take oral diabetes medicines (pills) the morning of surgery. Linagliptin (Tradjenta)  . THE NIGHT BEFORE SURGERY, take  10 or 15 -units of Lantus insulin.       Marland Kitchen HE MORNING OF SURGERY, take _____________ units of __________insulin.  . The day of surgery, do not take other diabetes injectables, including Byetta (exenatide), Bydureon (exenatide ER), Victoza (liraglutide), or Trulicity (  dulaglutide).  . If your CBG is greater than 220 mg/dL, you may take  of your sliding scale (correction) dose of insulin.  Other Instructions:          Patient Signature:  Date:   Nurse Signature:  Date:   Reviewed and Endorsed by [facility] Patient Education Committee, August 2015  Do not wear jewelry, make-up or nail polish.  Do not wear lotions, powders, or perfumes.  You may wear deodorant.  Do not shave 48 hours prior to surgery.  Men may shave face and neck.  Do not bring valuables to the hospital.  [facility] is not responsible  for any belongings or valuables.  Contacts, dentures or bridgework may not be worn into surgery.  Leave your suitcase in the car.  After surgery it may be brought to your room.  For patients admitted to the hospital, discharge time will be determined by your treatment team.  Patients discharged the day of surgery will not be allowed to drive home.    Special instructions:  [place] - Preparing for Surgery  Before surgery, you can play an important role.  Because skin is not sterile, your skin needs to be as free of germs as possible.  You can reduce the number of germs on you skin by washing with CHG (chlorahexidine gluconate) soap before surgery.  CHG is an antiseptic cleaner which kills germs and bonds with the skin to continue killing germs even after washing.  Please DO NOT use if you have an allergy to CHG or antibacterial soaps.  If your skin becomes reddened/irritated stop using the CHG and inform your nurse when you arrive at Short Stay.  Do not shave (including legs and underarms) for at least 48 hours prior to the first CHG shower.  You may shave your face.  Please follow these instructions carefully:   1.  Shower with CHG Soap the night before surgery and the  morning of Surgery.  2.  If you choose to wash your hair, wash your hair first as usual with your       normal shampoo.  3.  After you shampoo, rinse your hair and body thoroughly to remove the                      Shampoo.  4.  Use CHG as you would any other liquid soap.  You can apply chg directly       to the skin and wash gently with scrungie or a clean washcloth.  5.  Apply the CHG Soap to your body ONLY FROM THE NECK DOWN.        Do not use on open wounds or open sores.  Avoid contact with your eyes,       ears, mouth and genitals (private parts).  Wash genitals (private parts)       with your normal soap.  6.  Wash thoroughly, paying special attention to the area where your surgery        will be performed.  7.   Thoroughly rinse your body with warm water from the neck down.  8.  DO NOT shower/wash with your normal soap after using and rinsing off       the CHG Soap.  9.  Pat yourself dry with a clean towel.            10.  Wear clean pajamas.  11.  Place clean sheets on your bed the night of your first shower and do not        sleep with pets.  Day of Surgery  Do not apply any lotions/deoderants the morning of surgery.  Please wear clean clothes to the hospital/surgery center.     Please read over the following fact sheets that you were given. Pain Booklet, Coughing and Deep Breathing, MRSA Information and Surgical Site Infection Prevention, Incentive spirometry

## 2015-11-24 LAB — HEMOGLOBIN A1C
HEMOGLOBIN A1C: 7.1 % — AB (ref 4.8–5.6)
MEAN PLASMA GLUCOSE: 157 mg/dL

## 2015-11-24 NOTE — Progress Notes (Signed)
Anesthesia Chart Review: Patient is 75 year old female scheduled for L5-S1 decompression and bilateral microdiscectomy on 12/01/15 by Dr. Lorin Mercy.  History includes CAD (s/p CABG 07/29/11: LIMA-LAD; SVG-DIAG; SVG-RCA), HTN, DM2, carotid artery occlusive disease s/p right CEA 04/21/15, atrial fibrillation (not recent documentation seen about this), TIA, glaucoma, anemia, CKD (stage V, not yet on dialysis but s/p LUE AVGG 09/01/14), GERD, non-smoker, melanoma (head), SOB, depression. Reports prior history of being difficult to wake up after anesthesia.  - PCP is listed as Dr. Josetta Huddle. He signed a note of medical clearance with recommendations to avoid hypotension and dehydration due to her underlying CKD.  - Nephrologist is Dr. Mercy Moore at [facility] ([place]).  - Vascular surgeon is Dr. Scot Dock, last visit 11/08/15. - Neurologist is Dr. Rosalin Hawking, last visit 09/20/15. - Cardiologist is Dr. Irish Lack, last office visit 08/31/15. He is aware of surgery plans and deferred ASA instructions to surgeon (to hold five days preoperative).  Medications include amlodipine, ASA, Lipitor, Lasix, Humalog, Lantus, linagliptin, fish oil, metoprolol, Prilosec, Celexa, Oxy-IR, Travatan, Timoptic, brimonidine.   07/13/15 EKG: SR, right BBB, LAFB.  11/08/15 Carotid U/S:  1. Widely patent bilateral carotid endarterectomy without evidence of restenosis or hyperplasia. 2. Bilateral vertebral artery is antegrade.   07/27/15-08/25/15 30 day event monitor:   No pathologic arrhythmias noted.  No atrial fibrillation. Continue medical therapy  07/14/15 Echo: Study Conclusions - Left ventricle: The cavity size was normal. Wall thickness was  normal. Systolic function was normal. The estimated ejection  fraction was in the range of 55% to 60%. - Mitral valve: Calcified annulus. Mildly thickened leaflets . - Left atrium: The atrium was mildly dilated. - Atrial septum: No defect or patent foramen ovale was  identified.  Last LHC was pre-CABG on 07/24/11 (see Notes tab for report).  11/23/15 CXR: IMPRESSION: Cardiomegaly without pulmonary edema. Mild hyperinflation which may be voluntary but likely reflects chronic bronchitic change or reactive airway disease. There is no pneumonia.  Preoperative labs noted. BUN 77, Cr 2.74 (since ~ late 03/2015, Cr has been ~ 2.40-3.1), glucose 134, H/H 11.5/35.2, PLT 174. PT/PTT WNL. A1c 7.1.   Patient with recent PCP, vascular surgery, neurology, and cardiology follow-up. I do not currently have her last nephrology note, but renal function appears to be within her baseline. She has medical clearance and tolerate vascular surgery ~ 6 months ago. Reviewed with anesthesiologist Dr. Tobias Alexander who agrees that if no acute changes then it is anticipated that she can proceed as planned.  George Hugh [facility] Short Stay Center/Anesthesiology Phone [phone] 11/24/2015 3:45 PM

## 2015-11-30 MED ORDER — CEFAZOLIN SODIUM-DEXTROSE 2-4 GM/100ML-% IV SOLN
2.0000 g | INTRAVENOUS | Status: AC
  Administered 2015-12-01: 2 g via INTRAVENOUS
  Filled 2015-11-30: qty 100

## 2015-11-30 NOTE — H&P (Signed)
[name] is an 75 y.o. female.    A 75 year old white female returns for a followup visit for low back pain and left lower extremity radiculopathy.  She did have an MRI of the lumbar spine done on 10/14/2015, and report read L5-S1 circumferential disk bulging, disk space height loss, and moderate facet arthrosis resulting in mild bilateral lateral recess and moderate bilateral neural foraminal stenosis which is unchanged.  She also has L4-5 mild disk bulge and mild facet hypertrophy resulting in minimal bilateral lateral recess narrowing without neural foraminal stenosis.  She has failed conservative treatment with multiple ESIs with Dr. Ernestina Patches.  Patient also complaining of left lateral hip pain that is aggravated when she is ambulating and lying on her left side at night.  Also has a known history of left knee issues and has had previous left knee arthroscopy with debridement by Dr. Alphonzo Severance.  Has had ongoing knee pain and some swelling.  She states that her left-sided low back pain radiates to her buttock and down to her left foot.  No radicular symptoms on the right side.   Past Medical History  Diagnosis Date  . Anemia   . Glaucoma   . Hypertension   . Blood transfusion   . GERD (gastroesophageal reflux disease)   . Arthritis   . Neuromuscular disorder ([place])     CARPEL TUNNEL  . Atrial fibrillation ([place])   . CAD (coronary artery disease)   . Carotid artery occlusion     Carotid Endartectom,y - left 2009.  Blockage Right being watched by Dr Scot Dock.  Marland Kitchen HOH (hard of hearing)   . Shortness of breath   . Depression   . History of kidney stones     passed  . Carotid stenosis   . Complication of anesthesia     pt. states that she was difficult to wake  . Stroke ([place])     hx of TIA  . 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 Cancer ([place])     .  top of head- melonoma  . Dysrhythmia   . History of pneumonia   . Diabetes mellitus without complication ([place])   .  Myocardial infarction ([place])   . History of hiatal hernia   . Pneumonia     Past Surgical History  Procedure Laterality Date  . Carpel tunnel    . Neck surgery    . Esophagogastroduodenoscopy  07/25/2011    Procedure: ESOPHAGOGASTRODUODENOSCOPY (EGD);  Surgeon: Winfield Cunas., MD;  Location: [facility] ENDOSCOPY;  Service: Endoscopy;  Laterality: N/A;  . Colonoscopy  07/25/2011    Procedure: COLONOSCOPY;  Surgeon: Winfield Cunas., MD;  Location: [facility] ENDOSCOPY;  Service: Endoscopy;  Laterality: N/A;  . Coronary artery bypass graft  07/29/2011    Procedure: CORONARY ARTERY BYPASS GRAFTING (CABG);  Surgeon: Gaye Pollack, MD;  Location: [place];  Service: Open Heart Surgery;  Laterality: N/A;  . Carotid endarterectomy Left 2009     CEA  . Av fistula placement Left 05/31/2014    Procedure: Creation of Left Arm arteriovenous brachiocephalic Fistula;  Surgeon: Angelia Mould, MD;  Location: [facility];  Service: Vascular;  Laterality: Left;  . Fistulogram Left 08/29/2014    Procedure: FISTULOGRAM;  Surgeon: Angelia Mould, MD;  Location: [facility] CATH LAB;  Service: Cardiovascular;  Laterality: Left;  . Av fistula placement Left 09/01/2014    Procedure: INSERTION OF ARTERIOVENOUS (AV) GORE-TEX GRAFT LEFT UPPER ARM  USING  4-7 MM X 45 CM SRTETCH GORETEX GRAFT;  Surgeon: Angelia Mould, MD;  Location: [place];  Service: Vascular;  Laterality: Left;  . Cardiac catheterization    . Tonsillectomy    . Eye surgery    . Endarterectomy Right 04/21/2015    Procedure: ENDARTERECTOMY CAROTID;  Surgeon: Angelia Mould, MD;  Location: [facility] OR;  Service: Vascular;  Laterality: Right;  . Knee surgery Left     Family History  Problem Relation Age of Onset  . Diabetes Mother   . Hypertension Mother   . Heart disease Mother     beofre age 19  . Heart attack Mother   . Stroke Mother   . Cancer Father   . Hyperlipidemia Father   . Hypertension Father   . Deep vein thrombosis Daughter   .  Diabetes Daughter   . Hyperlipidemia Daughter   . Hypertension Daughter   . Heart disease Daughter   . Peripheral vascular disease Daughter    Social History:  reports that she has never smoked. She has never used smokeless tobacco. She reports that she does not drink alcohol or use illicit drugs.  Allergies:  Allergies  Allergen Reactions  . Gabapentin Other (See Comments)    hallucinations  . Amlodipine     Other reaction(s): edema  . Other     Other reaction(s): Fatigue, Gout, Hallucintions  . Sulfa Antibiotics     Other reaction(s): psychiatric disturbances    No prescriptions prior to admission    No results found for this or any previous visit (from the past 48 hour(s)). No results found.  Review of Systems  Constitutional: Negative.   HENT: Negative.   Eyes: Negative.   Respiratory: Negative.   Cardiovascular: Negative.   Gastrointestinal: Negative.   Genitourinary: Negative.   Musculoskeletal: Positive for back pain.  Skin: Negative.   Neurological: Negative.   Psychiatric/Behavioral: Negative.     There were no vitals taken for this visit. Physical Exam  Constitutional: She is oriented to person, place, and time. No distress.  HENT:  Head: Atraumatic.  Eyes: EOM are normal. Pupils are equal, round, and reactive to light.  Neck: Normal range of motion.  Cardiovascular: Normal rate.   Respiratory: No respiratory distress.  Neurological: She is alert and oriented to person, place, and time.  Skin: Skin is warm and dry.  Psychiatric: She has a normal mood and affect.      PHYSICAL EXAMINATION:  An extremely pleasant white female alert and oriented x3 and in no acute distress.  Husband and daughter are present during the visit.  Patient does have a Trendelenburg gait.  Head is normocephalic, atraumatic.  Extraocular movements intact.  No respiratory distress.  Abdomen round, nondistended.  She has left sciatic notch tenderness.  She is moderately to markedly  tender over the left hip greater trochanteric bursa, negative on the right side.  She has good bilateral hip internal/external rotation, but she does complain of some pain over the lateral left hip.  Left Knee:  Joint line is tender.  Bilateral calves nontender.  She is neurovascularly intact.  Skin warm and dry.   ASSESSMENT:   1.  Low back pain and left lower extremity radiculopathy.  Failed conservative treatment with multiple ESIs.  MRI showed lumbar stenosis and disk protrusion at L5-S1. 2.  Left lateral hip pain also secondary to greater trochanteric bursitis. 3.  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  Being followed by Dr. Marlou Sa for this.  PLAN:  Today Dr. Lorin Mercy did review the recent lumbar spine MRI findings with patient and her family who are present.  We feel that the best treatment option at this point would be bilateral L5-S1 decompression and possible microdiskectomy.  Surgical procedure along with potential rehab/recovery time discussed.  She has failed conservative treatment with multiple ESIs.  Also to give her some relief of her lateral hip pain, I offered an injection.  Patient did have improvement of her hip pain after the injection with Marcaine in place.  We will need to get preop medical clearance.  She will follow up with Dr. Marlou Sa for her left knee issues.  All questions answered.  Lanae Crumbly, PA-C 11/30/2015, 4:55 PM

## 2015-12-01 ENCOUNTER — Inpatient Hospital Stay (HOSPITAL_COMMUNITY): Payer: Medicare Other | Admitting: Emergency Medicine

## 2015-12-01 ENCOUNTER — Inpatient Hospital Stay (HOSPITAL_COMMUNITY)
Admission: RE | Admit: 2015-12-01 | Discharge: 2015-12-04 | DRG: 519 | Disposition: A | Discharge status: Home / Self Care | Payer: Medicare Other | Source: Ambulatory Visit | Attending: Orthopaedic Surgery | Admitting: Orthopaedic Surgery

## 2015-12-01 ENCOUNTER — Inpatient Hospital Stay (HOSPITAL_COMMUNITY): Payer: Medicare Other

## 2015-12-01 ENCOUNTER — Encounter (HOSPITAL_COMMUNITY): Payer: Self-pay | Admitting: Anesthesiology

## 2015-12-01 ENCOUNTER — Inpatient Hospital Stay (HOSPITAL_COMMUNITY): Payer: Medicare Other | Admitting: Anesthesiology

## 2015-12-01 ENCOUNTER — Encounter (HOSPITAL_COMMUNITY)
Admission: RE | Disposition: A | Discharge status: Home / Self Care | Payer: Self-pay | Source: Ambulatory Visit | Attending: Orthopaedic Surgery

## 2015-12-01 DIAGNOSIS — H9190 Unspecified hearing loss, unspecified ear: Secondary | ICD-10-CM | POA: Diagnosis present

## 2015-12-01 DIAGNOSIS — F329 Major depressive disorder, single episode, unspecified: Secondary | ICD-10-CM | POA: Diagnosis present

## 2015-12-01 DIAGNOSIS — E1122 Type 2 diabetes mellitus with diabetic chronic kidney disease: Secondary | ICD-10-CM | POA: Diagnosis present

## 2015-12-01 DIAGNOSIS — N184 Chronic kidney disease, stage 4 (severe): Secondary | ICD-10-CM | POA: Diagnosis not present

## 2015-12-01 DIAGNOSIS — M5416 Radiculopathy, lumbar region: Secondary | ICD-10-CM | POA: Diagnosis not present

## 2015-12-01 DIAGNOSIS — N185 Chronic kidney disease, stage 5: Secondary | ICD-10-CM | POA: Diagnosis present

## 2015-12-01 DIAGNOSIS — Z85820 Personal history of malignant melanoma of skin: Secondary | ICD-10-CM

## 2015-12-01 DIAGNOSIS — I4891 Unspecified atrial fibrillation: Secondary | ICD-10-CM | POA: Diagnosis present

## 2015-12-01 DIAGNOSIS — N179 Acute kidney failure, unspecified: Secondary | ICD-10-CM | POA: Diagnosis not present

## 2015-12-01 DIAGNOSIS — N189 Chronic kidney disease, unspecified: Secondary | ICD-10-CM | POA: Diagnosis not present

## 2015-12-01 DIAGNOSIS — M7062 Trochanteric bursitis, left hip: Secondary | ICD-10-CM | POA: Diagnosis present

## 2015-12-01 DIAGNOSIS — Z794 Long term (current) use of insulin: Secondary | ICD-10-CM | POA: Diagnosis not present

## 2015-12-01 DIAGNOSIS — D62 Acute posthemorrhagic anemia: Secondary | ICD-10-CM | POA: Diagnosis not present

## 2015-12-01 DIAGNOSIS — M1712 Unilateral primary osteoarthritis, left knee: Secondary | ICD-10-CM | POA: Diagnosis present

## 2015-12-01 DIAGNOSIS — M5127 Other intervertebral disc displacement, lumbosacral region: Secondary | ICD-10-CM | POA: Diagnosis present

## 2015-12-01 DIAGNOSIS — M79662 Pain in left lower leg: Secondary | ICD-10-CM

## 2015-12-01 DIAGNOSIS — M2578 Osteophyte, vertebrae: Secondary | ICD-10-CM | POA: Diagnosis present

## 2015-12-01 DIAGNOSIS — Z951 Presence of aortocoronary bypass graft: Secondary | ICD-10-CM | POA: Diagnosis not present

## 2015-12-01 DIAGNOSIS — Z882 Allergy status to sulfonamides status: Secondary | ICD-10-CM

## 2015-12-01 DIAGNOSIS — I252 Old myocardial infarction: Secondary | ICD-10-CM

## 2015-12-01 DIAGNOSIS — R296 Repeated falls: Secondary | ICD-10-CM | POA: Diagnosis present

## 2015-12-01 DIAGNOSIS — K219 Gastro-esophageal reflux disease without esophagitis: Secondary | ICD-10-CM | POA: Diagnosis present

## 2015-12-01 DIAGNOSIS — Z8673 Personal history of transient ischemic attack (TIA), and cerebral infarction without residual deficits: Secondary | ICD-10-CM | POA: Diagnosis not present

## 2015-12-01 DIAGNOSIS — M7989 Other specified soft tissue disorders: Secondary | ICD-10-CM | POA: Diagnosis not present

## 2015-12-01 DIAGNOSIS — M79661 Pain in right lower leg: Secondary | ICD-10-CM | POA: Diagnosis not present

## 2015-12-01 DIAGNOSIS — H409 Unspecified glaucoma: Secondary | ICD-10-CM | POA: Diagnosis present

## 2015-12-01 DIAGNOSIS — Z888 Allergy status to other drugs, medicaments and biological substances status: Secondary | ICD-10-CM

## 2015-12-01 DIAGNOSIS — M4806 Spinal stenosis, lumbar region: Secondary | ICD-10-CM | POA: Diagnosis not present

## 2015-12-01 DIAGNOSIS — I120 Hypertensive chronic kidney disease with stage 5 chronic kidney disease or end stage renal disease: Secondary | ICD-10-CM | POA: Diagnosis present

## 2015-12-01 DIAGNOSIS — Z419 Encounter for procedure for purposes other than remedying health state, unspecified: Secondary | ICD-10-CM

## 2015-12-01 DIAGNOSIS — E1151 Type 2 diabetes mellitus with diabetic peripheral angiopathy without gangrene: Secondary | ICD-10-CM | POA: Diagnosis present

## 2015-12-01 DIAGNOSIS — I2510 Atherosclerotic heart disease of native coronary artery without angina pectoris: Secondary | ICD-10-CM | POA: Diagnosis present

## 2015-12-01 DIAGNOSIS — Z8639 Personal history of other endocrine, nutritional and metabolic disease: Secondary | ICD-10-CM | POA: Diagnosis not present

## 2015-12-01 DIAGNOSIS — M5116 Intervertebral disc disorders with radiculopathy, lumbar region: Principal | ICD-10-CM | POA: Diagnosis present

## 2015-12-01 DIAGNOSIS — I10 Essential (primary) hypertension: Secondary | ICD-10-CM | POA: Diagnosis not present

## 2015-12-01 DIAGNOSIS — Z9889 Other specified postprocedural states: Secondary | ICD-10-CM | POA: Diagnosis not present

## 2015-12-01 DIAGNOSIS — R269 Unspecified abnormalities of gait and mobility: Secondary | ICD-10-CM | POA: Diagnosis not present

## 2015-12-01 DIAGNOSIS — Z7984 Long term (current) use of oral hypoglycemic drugs: Secondary | ICD-10-CM

## 2015-12-01 DIAGNOSIS — M5126 Other intervertebral disc displacement, lumbar region: Secondary | ICD-10-CM | POA: Diagnosis not present

## 2015-12-01 DIAGNOSIS — E1142 Type 2 diabetes mellitus with diabetic polyneuropathy: Secondary | ICD-10-CM | POA: Diagnosis not present

## 2015-12-01 DIAGNOSIS — R109 Unspecified abdominal pain: Secondary | ICD-10-CM | POA: Diagnosis not present

## 2015-12-01 DIAGNOSIS — I481 Persistent atrial fibrillation: Secondary | ICD-10-CM | POA: Diagnosis not present

## 2015-12-01 DIAGNOSIS — R52 Pain, unspecified: Secondary | ICD-10-CM | POA: Diagnosis not present

## 2015-12-01 DIAGNOSIS — M5417 Radiculopathy, lumbosacral region: Secondary | ICD-10-CM | POA: Diagnosis not present

## 2015-12-01 DIAGNOSIS — R062 Wheezing: Secondary | ICD-10-CM | POA: Diagnosis not present

## 2015-12-01 DIAGNOSIS — D638 Anemia in other chronic diseases classified elsewhere: Secondary | ICD-10-CM | POA: Diagnosis not present

## 2015-12-01 DIAGNOSIS — M545 Low back pain: Secondary | ICD-10-CM | POA: Diagnosis not present

## 2015-12-01 HISTORY — PX: LUMBAR LAMINECTOMY/DECOMPRESSION MICRODISCECTOMY: SHX5026

## 2015-12-01 LAB — GLUCOSE, CAPILLARY
GLUCOSE-CAPILLARY: 111 mg/dL — AB (ref 65–99)
GLUCOSE-CAPILLARY: 147 mg/dL — AB (ref 65–99)
GLUCOSE-CAPILLARY: 154 mg/dL — AB (ref 65–99)
Glucose-Capillary: 117 mg/dL — ABNORMAL HIGH (ref 65–99)

## 2015-12-01 SURGERY — LUMBAR LAMINECTOMY/DECOMPRESSION MICRODISCECTOMY
Anesthesia: General | Site: Back

## 2015-12-01 MED ORDER — METHOCARBAMOL 500 MG PO TABS
500.0000 mg | ORAL_TABLET | Freq: Four times a day (QID) | ORAL | Status: DC | PRN
  Administered 2015-12-01 – 2015-12-04 (×8): 500 mg via ORAL
  Filled 2015-12-01 (×9): qty 1

## 2015-12-01 MED ORDER — SODIUM CHLORIDE 0.9 % IV SOLN
10.0000 mg | INTRAVENOUS | Status: DC | PRN
  Administered 2015-12-01: 15 ug/min via INTRAVENOUS

## 2015-12-01 MED ORDER — PHENOL 1.4 % MT LIQD: 1.0000 | OROMUCOSAL | Status: DC | PRN

## 2015-12-01 MED ORDER — CISATRACURIUM BESYLATE 20 MG/10ML IV SOLN
INTRAVENOUS | Status: AC
  Filled 2015-12-01: qty 10

## 2015-12-01 MED ORDER — CISATRACURIUM 2MG/ML (10ML) SYRINGE FOR MED FUSION PUMP - OPTIME
INTRAVENOUS | Status: DC | PRN
  Administered 2015-12-01: 16 mg via INTRAVENOUS

## 2015-12-01 MED ORDER — LATANOPROST 0.005 % OP SOLN
1.0000 [drp] | Freq: Every day | OPHTHALMIC | Status: DC
  Administered 2015-12-01 – 2015-12-03 (×3): 1 [drp] via OPHTHALMIC
  Filled 2015-12-01: qty 2.5

## 2015-12-01 MED ORDER — HYDROMORPHONE HCL 1 MG/ML IJ SOLN
0.5000 mg | INTRAMUSCULAR | Status: DC | PRN
  Administered 2015-12-02 – 2015-12-04 (×4): 0.5 mg via INTRAVENOUS
  Filled 2015-12-01 (×4): qty 1

## 2015-12-01 MED ORDER — POLYETHYLENE GLYCOL 3350 17 G PO PACK
17.0000 g | PACK | Freq: Every day | ORAL | Status: DC | PRN
  Administered 2015-12-03: 17 g via ORAL
  Filled 2015-12-01 (×2): qty 1

## 2015-12-01 MED ORDER — PROPOFOL 10 MG/ML IV BOLUS
INTRAVENOUS | Status: DC | PRN
  Administered 2015-12-01: 100 mg via INTRAVENOUS

## 2015-12-01 MED ORDER — OXYCODONE-ACETAMINOPHEN 5-325 MG PO TABS
ORAL_TABLET | ORAL | Status: AC
Start: 2015-12-01 — End: 2015-12-02
  Filled 2015-12-01: qty 1

## 2015-12-01 MED ORDER — METHOCARBAMOL 1000 MG/10ML IJ SOLN
500.0000 mg | Freq: Four times a day (QID) | INTRAVENOUS | Status: DC | PRN
  Filled 2015-12-01: qty 5

## 2015-12-01 MED ORDER — SODIUM CHLORIDE 0.9% FLUSH
3.0000 mL | Freq: Two times a day (BID) | INTRAVENOUS | Status: DC
  Administered 2015-12-01 – 2015-12-04 (×3): 3 mL via INTRAVENOUS

## 2015-12-01 MED ORDER — CITALOPRAM HYDROBROMIDE 20 MG PO TABS
20.0000 mg | ORAL_TABLET | Freq: Every day | ORAL | Status: DC
  Administered 2015-12-02 – 2015-12-04 (×3): 20 mg via ORAL
  Filled 2015-12-01 (×3): qty 1

## 2015-12-01 MED ORDER — OXYCODONE-ACETAMINOPHEN 5-325 MG PO TABS
1.0000 | ORAL_TABLET | Freq: Four times a day (QID) | ORAL | Status: DC | PRN
  Administered 2015-12-01: 1 via ORAL

## 2015-12-01 MED ORDER — FENTANYL CITRATE (PF) 250 MCG/5ML IJ SOLN
INTRAMUSCULAR | Status: AC
  Filled 2015-12-01: qty 5

## 2015-12-01 MED ORDER — PANTOPRAZOLE SODIUM 40 MG PO TBEC
40.0000 mg | DELAYED_RELEASE_TABLET | Freq: Every day | ORAL | Status: DC
  Administered 2015-12-01 – 2015-12-04 (×4): 40 mg via ORAL
  Filled 2015-12-01 (×4): qty 1

## 2015-12-01 MED ORDER — SODIUM CHLORIDE 0.9% FLUSH: 3.0000 mL | INTRAVENOUS | Status: DC | PRN

## 2015-12-01 MED ORDER — SODIUM CHLORIDE 0.9 % IV SOLN: 250.0000 mL | INTRAVENOUS | Status: DC

## 2015-12-01 MED ORDER — ALLOPURINOL 100 MG PO TABS
100.0000 mg | ORAL_TABLET | Freq: Two times a day (BID) | ORAL | Status: DC
  Administered 2015-12-01 – 2015-12-04 (×6): 100 mg via ORAL
  Filled 2015-12-01 (×6): qty 1

## 2015-12-01 MED ORDER — FUROSEMIDE 40 MG PO TABS
80.0000 mg | ORAL_TABLET | Freq: Two times a day (BID) | ORAL | Status: DC
  Administered 2015-12-01 – 2015-12-04 (×6): 80 mg via ORAL
  Filled 2015-12-01 (×6): qty 2

## 2015-12-01 MED ORDER — NEOSTIGMINE METHYLSULFATE 10 MG/10ML IV SOLN
INTRAVENOUS | Status: DC | PRN
  Administered 2015-12-01: 5 mg via INTRAVENOUS

## 2015-12-01 MED ORDER — METHOCARBAMOL 500 MG PO TABS
ORAL_TABLET | ORAL | Status: AC
  Filled 2015-12-01: qty 1

## 2015-12-01 MED ORDER — GLYCOPYRROLATE 0.2 MG/ML IJ SOLN
INTRAMUSCULAR | Status: DC | PRN
  Administered 2015-12-01: .2 mg via INTRAVENOUS
  Administered 2015-12-01: 0.4 mg via INTRAVENOUS

## 2015-12-01 MED ORDER — SODIUM CHLORIDE 0.9 % IV SOLN
INTRAVENOUS | Status: DC
  Administered 2015-12-01: 11:00:00 via INTRAVENOUS
  Administered 2015-12-01: 20 mL/h via INTRAVENOUS

## 2015-12-01 MED ORDER — SUGAMMADEX SODIUM 200 MG/2ML IV SOLN
INTRAVENOUS | Status: AC
  Filled 2015-12-01: qty 2

## 2015-12-01 MED ORDER — LATANOPROST 0.005 % OP SOLN
1.0000 [drp] | Freq: Every day | OPHTHALMIC | Status: DC
  Filled 2015-12-01: qty 2.5

## 2015-12-01 MED ORDER — FENTANYL CITRATE (PF) 100 MCG/2ML IJ SOLN
INTRAMUSCULAR | Status: AC
Start: 2015-12-01 — End: 2015-12-02
  Filled 2015-12-01: qty 2

## 2015-12-01 MED ORDER — FENTANYL CITRATE (PF) 100 MCG/2ML IJ SOLN
INTRAMUSCULAR | Status: DC | PRN
  Administered 2015-12-01: 50 ug via INTRAVENOUS

## 2015-12-01 MED ORDER — AMLODIPINE BESYLATE 10 MG PO TABS
10.0000 mg | ORAL_TABLET | Freq: Every day | ORAL | Status: DC
  Administered 2015-12-02 – 2015-12-04 (×3): 10 mg via ORAL
  Filled 2015-12-01 (×3): qty 1

## 2015-12-01 MED ORDER — INSULIN GLARGINE 100 UNIT/ML ~~LOC~~ SOLN
20.0000 [IU] | Freq: Every day | SUBCUTANEOUS | Status: DC
  Administered 2015-12-01 – 2015-12-03 (×3): 20 [IU] via SUBCUTANEOUS
  Filled 2015-12-01 (×4): qty 0.2

## 2015-12-01 MED ORDER — KETOROLAC TROMETHAMINE 15 MG/ML IJ SOLN
15.0000 mg | Freq: Four times a day (QID) | INTRAMUSCULAR | Status: AC
  Administered 2015-12-01 – 2015-12-02 (×4): 15 mg via INTRAVENOUS
  Filled 2015-12-01 (×4): qty 1

## 2015-12-01 MED ORDER — LIDOCAINE HCL (CARDIAC) 20 MG/ML IV SOLN
INTRAVENOUS | Status: DC | PRN
  Administered 2015-12-01: 50 mg via INTRAVENOUS

## 2015-12-01 MED ORDER — CHLORHEXIDINE GLUCONATE 4 % EX LIQD: 60.0000 mL | Freq: Once | CUTANEOUS | Status: DC

## 2015-12-01 MED ORDER — LINAGLIPTIN 5 MG PO TABS
5.0000 mg | ORAL_TABLET | Freq: Every day | ORAL | Status: DC
  Administered 2015-12-02 – 2015-12-04 (×3): 5 mg via ORAL
  Filled 2015-12-01 (×3): qty 1

## 2015-12-01 MED ORDER — ARTIFICIAL TEARS OP OINT
TOPICAL_OINTMENT | OPHTHALMIC | Status: DC | PRN
  Administered 2015-12-01: 1 via OPHTHALMIC

## 2015-12-01 MED ORDER — BUPIVACAINE HCL (PF) 0.25 % IJ SOLN
INTRAMUSCULAR | Status: AC
  Filled 2015-12-01: qty 30

## 2015-12-01 MED ORDER — ONDANSETRON HCL 4 MG/2ML IJ SOLN
4.0000 mg | INTRAMUSCULAR | Status: DC | PRN
  Administered 2015-12-04: 4 mg via INTRAVENOUS
  Filled 2015-12-01: qty 2

## 2015-12-01 MED ORDER — HYDROMORPHONE HCL 1 MG/ML IJ SOLN
0.5000 mg | INTRAMUSCULAR | Status: DC | PRN
  Administered 2015-12-01: 0.5 mg via INTRAVENOUS
  Filled 2015-12-01: qty 1

## 2015-12-01 MED ORDER — TIMOLOL MALEATE 0.5 % OP SOLN
1.0000 [drp] | Freq: Every day | OPHTHALMIC | Status: DC
  Administered 2015-12-02 – 2015-12-04 (×3): 1 [drp] via OPHTHALMIC
  Filled 2015-12-01: qty 5

## 2015-12-01 MED ORDER — 0.9 % SODIUM CHLORIDE (POUR BTL) OPTIME
TOPICAL | Status: DC | PRN
  Administered 2015-12-01: 1000 mL

## 2015-12-01 MED ORDER — BUPIVACAINE HCL (PF) 0.25 % IJ SOLN
INTRAMUSCULAR | Status: DC | PRN
  Administered 2015-12-01: 10 mL

## 2015-12-01 MED ORDER — INSULIN ASPART 100 UNIT/ML ~~LOC~~ SOLN
0.0000 [IU] | Freq: Three times a day (TID) | SUBCUTANEOUS | Status: DC
  Administered 2015-12-01 – 2015-12-02 (×2): 2 [IU] via SUBCUTANEOUS
  Administered 2015-12-03 (×2): 3 [IU] via SUBCUTANEOUS
  Administered 2015-12-03: 5 [IU] via SUBCUTANEOUS
  Administered 2015-12-04 (×2): 3 [IU] via SUBCUTANEOUS

## 2015-12-01 MED ORDER — ONDANSETRON HCL 4 MG/2ML IJ SOLN
INTRAMUSCULAR | Status: DC | PRN
  Administered 2015-12-01: 4 mg via INTRAVENOUS

## 2015-12-01 MED ORDER — FENTANYL CITRATE (PF) 100 MCG/2ML IJ SOLN
25.0000 ug | INTRAMUSCULAR | Status: DC | PRN
  Administered 2015-12-01 (×4): 25 ug via INTRAVENOUS

## 2015-12-01 MED ORDER — LACTATED RINGERS IV SOLN: INTRAVENOUS | Status: DC | PRN

## 2015-12-01 MED ORDER — TRAVOPROST 0.004 % OP SOLN: 1.0000 [drp] | Freq: Every day | OPHTHALMIC | Status: DC

## 2015-12-01 MED ORDER — OXYCODONE-ACETAMINOPHEN 5-325 MG PO TABS
1.0000 | ORAL_TABLET | ORAL | Status: DC | PRN
  Administered 2015-12-01 – 2015-12-04 (×11): 1 via ORAL
  Filled 2015-12-01 (×12): qty 1

## 2015-12-01 MED ORDER — BRIMONIDINE TARTRATE 0.15 % OP SOLN
1.0000 [drp] | Freq: Two times a day (BID) | OPHTHALMIC | Status: DC
  Administered 2015-12-01 – 2015-12-04 (×6): 1 [drp] via OPHTHALMIC
  Filled 2015-12-01: qty 5

## 2015-12-01 MED ORDER — METOPROLOL TARTRATE 50 MG PO TABS
50.0000 mg | ORAL_TABLET | Freq: Two times a day (BID) | ORAL | Status: DC
  Administered 2015-12-01 – 2015-12-04 (×6): 50 mg via ORAL
  Filled 2015-12-01 (×6): qty 1

## 2015-12-01 MED ORDER — ONDANSETRON HCL 4 MG/2ML IJ SOLN: 4.0000 mg | Freq: Once | INTRAMUSCULAR | Status: DC | PRN

## 2015-12-01 MED ORDER — ATORVASTATIN CALCIUM 10 MG PO TABS
10.0000 mg | ORAL_TABLET | Freq: Every day | ORAL | Status: DC
  Administered 2015-12-01 – 2015-12-04 (×4): 10 mg via ORAL
  Filled 2015-12-01 (×4): qty 1

## 2015-12-01 MED ORDER — CALCITRIOL 0.25 MCG PO CAPS
0.2500 ug | ORAL_CAPSULE | ORAL | Status: DC
  Administered 2015-12-01 – 2015-12-04 (×3): 0.25 ug via ORAL
  Filled 2015-12-01 (×4): qty 1

## 2015-12-01 MED ORDER — SODIUM CHLORIDE 0.45 % IV SOLN
INTRAVENOUS | Status: DC
  Administered 2015-12-01 – 2015-12-02 (×2): via INTRAVENOUS

## 2015-12-01 MED ORDER — CEFAZOLIN SODIUM 1-5 GM-% IV SOLN
1.0000 g | Freq: Three times a day (TID) | INTRAVENOUS | Status: AC
  Administered 2015-12-01 – 2015-12-02 (×2): 1 g via INTRAVENOUS
  Filled 2015-12-01 (×2): qty 50

## 2015-12-01 MED ORDER — DOCUSATE SODIUM 100 MG PO CAPS
100.0000 mg | ORAL_CAPSULE | Freq: Two times a day (BID) | ORAL | Status: DC
  Administered 2015-12-01 – 2015-12-04 (×7): 100 mg via ORAL
  Filled 2015-12-01 (×6): qty 1

## 2015-12-01 MED ORDER — MENTHOL 3 MG MT LOZG: 1.0000 | LOZENGE | OROMUCOSAL | Status: DC | PRN

## 2015-12-01 MED ORDER — LIDOCAINE 2% (20 MG/ML) 5 ML SYRINGE
INTRAMUSCULAR | Status: AC
  Filled 2015-12-01: qty 5

## 2015-12-01 SURGICAL SUPPLY — 42 items
BUR ROUND FLUTED 4 SOFT TCH (BURR) ×2 IMPLANT
BUR ROUND FLUTED 4MM SOFT TCH (BURR) ×1
CLOSURE STERI-STRIP 1/2X4 (GAUZE/BANDAGES/DRESSINGS) ×1
CLSR STERI-STRIP ANTIMIC 1/2X4 (GAUZE/BANDAGES/DRESSINGS) ×2 IMPLANT
COVER SURGICAL LIGHT HANDLE (MISCELLANEOUS) ×3 IMPLANT
DERMABOND ADVANCED (GAUZE/BANDAGES/DRESSINGS) ×2
DERMABOND ADVANCED .7 DNX12 (GAUZE/BANDAGES/DRESSINGS) ×1 IMPLANT
DRAPE MICROSCOPE LEICA (MISCELLANEOUS) ×3 IMPLANT
DRAPE PROXIMA HALF (DRAPES) ×6 IMPLANT
DRSG MEPILEX BORDER 4X4 (GAUZE/BANDAGES/DRESSINGS) ×3 IMPLANT
DRSG MEPILEX BORDER 4X8 (GAUZE/BANDAGES/DRESSINGS) IMPLANT
DURAPREP 26ML APPLICATOR (WOUND CARE) ×3 IMPLANT
ELECT REM PT RETURN 9FT ADLT (ELECTROSURGICAL) ×3
ELECTRODE REM PT RTRN 9FT ADLT (ELECTROSURGICAL) ×1 IMPLANT
GLOVE BIOGEL PI IND STRL 8 (GLOVE) ×2 IMPLANT
GLOVE BIOGEL PI INDICATOR 8 (GLOVE) ×4
GLOVE ORTHO TXT STRL SZ7.5 (GLOVE) ×6 IMPLANT
GOWN STRL REUS W/ TWL LRG LVL3 (GOWN DISPOSABLE) ×2 IMPLANT
GOWN STRL REUS W/ TWL XL LVL3 (GOWN DISPOSABLE) ×1 IMPLANT
GOWN STRL REUS W/TWL 2XL LVL3 (GOWN DISPOSABLE) ×3 IMPLANT
GOWN STRL REUS W/TWL LRG LVL3 (GOWN DISPOSABLE) ×4
GOWN STRL REUS W/TWL XL LVL3 (GOWN DISPOSABLE) ×2
KIT BASIN OR (CUSTOM PROCEDURE TRAY) ×3 IMPLANT
KIT ROOM TURNOVER OR (KITS) ×3 IMPLANT
MANIFOLD NEPTUNE II (INSTRUMENTS) ×3 IMPLANT
NEEDLE HYPO 25GX1X1/2 BEV (NEEDLE) ×3 IMPLANT
NEEDLE SPNL 18GX3.5 QUINCKE PK (NEEDLE) ×3 IMPLANT
NS IRRIG 1000ML POUR BTL (IV SOLUTION) ×3 IMPLANT
PACK LAMINECTOMY ORTHO (CUSTOM PROCEDURE TRAY) ×3 IMPLANT
PAD ARMBOARD 7.5X6 YLW CONV (MISCELLANEOUS) ×6 IMPLANT
PATTIES SURGICAL .5 X.5 (GAUZE/BANDAGES/DRESSINGS) IMPLANT
PATTIES SURGICAL .75X.75 (GAUZE/BANDAGES/DRESSINGS) IMPLANT
SUT BONE WAX W31G (SUTURE) IMPLANT
SUT VIC AB 0 CT1 27 (SUTURE) ×2
SUT VIC AB 0 CT1 27XBRD ANBCTR (SUTURE) ×1 IMPLANT
SUT VIC AB 1 CTX 27 (SUTURE) ×3 IMPLANT
SUT VIC AB 2-0 CT1 27 (SUTURE) ×2
SUT VIC AB 2-0 CT1 TAPERPNT 27 (SUTURE) ×1 IMPLANT
SUT VIC AB 3-0 X1 27 (SUTURE) ×3 IMPLANT
TOWEL OR 17X24 6PK STRL BLUE (TOWEL DISPOSABLE) ×3 IMPLANT
TOWEL OR 17X26 10 PK STRL BLUE (TOWEL DISPOSABLE) ×3 IMPLANT
WATER STERILE IRR 1000ML POUR (IV SOLUTION) ×3 IMPLANT

## 2015-12-01 NOTE — Brief Op Note (Signed)
12/01/2015  12:21 PM  PATIENT:  [name]  75 y.o. female  PRE-OPERATIVE DIAGNOSIS:  L5-S1 Stenosis, Herniated Nucleus Pulposus  POST-OPERATIVE DIAGNOSIS:  L5-S1 Stenosis, Herniated Nucleus Pulposus  PROCEDURE:  Procedure(s): L5-S1 Decompression and Bilateral Microdiscectomy (N/A)  SURGEON:  Surgeon(s) and Role:    * Marybelle Killings, MD - Primary  PHYSICIAN ASSISTANT: Benjiman Core pa-c    ANESTHESIA:   general  EBL:  Total I/O In: 500 [I.V.:500] Out: 200 [Blood:200]  BLOOD ADMINISTERED:none  DRAINS: none   LOCAL MEDICATIONS USED:  MARCAINE     SPECIMEN:  No Specimen  DISPOSITION OF SPECIMEN:  N/A  COUNTS:  YES  TOURNIQUET:  * No tourniquets in log *  DICTATION: .Dragon Dictation  PLAN OF CARE: Admit to inpatient   PATIENT DISPOSITION:  PACU - hemodynamically stable.

## 2015-12-01 NOTE — Transfer of Care (Signed)
Immediate Anesthesia Transfer of Care Note  Patient: [name]  Procedure(s) Performed: Procedure(s): L5-S1 Decompression and Bilateral Microdiscectomy (N/A)  Patient Location: PACU  Anesthesia Type:General  Level of Consciousness: awake, alert  and oriented  Airway & Oxygen Therapy: Patient Spontanous Breathing and Patient connected to nasal cannula oxygen  Post-op Assessment: Report given to RN, Post -op Vital signs reviewed and stable and Patient moving all extremities X 4  Post vital signs: Reviewed and stable  Last Vitals:  Filed Vitals:   12/01/15 0838  BP: 159/75  Pulse: 58  Temp: 36.8 C  Resp: 20    Last Pain: There were no vitals filed for this visit.       Complications: No apparent anesthesia complications

## 2015-12-01 NOTE — Anesthesia Procedure Notes (Signed)
Procedure Name: Intubation Date/Time: 12/01/2015 10:16 AM Performed by: Neldon Newport Pre-anesthesia Checklist: Timeout performed, Patient being monitored, Suction available, Emergency Drugs available and Patient identified Patient Re-evaluated:Patient Re-evaluated prior to inductionOxygen Delivery Method: Circle system utilized Preoxygenation: Pre-oxygenation with 100% oxygen Intubation Type: IV induction Ventilation: Mask ventilation without difficulty Laryngoscope Size: Mac, 3 and Glidescope (Grade 3 without glide scope.  rigid neck.) Grade View: Grade III Tube type: Oral Tube size: 7.0 mm Number of attempts: 1 Placement Confirmation: breath sounds checked- equal and bilateral,  positive ETCO2 and ETT inserted through vocal cords under direct vision Secured at: 21 cm Tube secured with: Tape Dental Injury: Teeth and Oropharynx as per pre-operative assessment

## 2015-12-01 NOTE — Interval H&P Note (Signed)
History and Physical Interval Note:  12/01/2015 9:04 AM  [name]  has presented today for surgery, with the diagnosis of L5-S1 Stenosis, Herniated Nucleus Pulposus  The various methods of treatment have been discussed with the patient and family. After consideration of risks, benefits and other options for treatment, the patient has consented to  Procedure(s): L5-S1 Decompression and Bilateral Microdiscectomy (N/A) as a surgical intervention .  The patient's history has been reviewed, patient examined, no change in status, stable for surgery.  I have reviewed the patient's chart and labs.  Questions were answered to the patient's satisfaction.     Skyeler Scalese C

## 2015-12-01 NOTE — Anesthesia Preprocedure Evaluation (Addendum)
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 Reports prior history of being difficult to wake up after anesthesia.  Reviewed: Allergy & Precautions, NPO status , Patient's Chart, lab work & pertinent test results  Airway Mallampati: II  TM Distance: >3 FB Neck ROM: limited    Dental  (+) Dental Advisory Given, Partial Upper, Poor Dentition, Missing,    Pulmonary neg pulmonary ROS,    Pulmonary exam normal breath sounds clear to auscultation       Cardiovascular hypertension, + angina + CAD, + Past MI, + CABG and + Peripheral Vascular Disease  Normal cardiovascular exam+ dysrhythmias Atrial Fibrillation  Rhythm:Regular Rate:Normal  07/14/15 Echo: Study Conclusions - Left ventricle: The cavity size was normal. Wall thickness was normal. Systolic function was normal. The estimated ejection fraction was in the range of 55% to 60%. - Mitral valve: Calcified annulus. Mildly thickened leaflets . - Left atrium: The atrium was mildly dilated. - Atrial septum: No defect or patent foramen ovale was identified.   Neuro/Psych PSYCHIATRIC DISORDERS Depression CVA    GI/Hepatic Neg liver ROS, hiatal hernia, GERD  ,  Endo/Other  diabetesObesity   Renal/GU ESRFRenal disease     Musculoskeletal  (+) Arthritis , Osteoarthritis,    Abdominal   Peds  Hematology  (+) anemia ,   Anesthesia Other Findings    Reproductive/Obstetrics                        Anesthesia Physical Anesthesia Plan  ASA: IV  Anesthesia Plan:  General   Post-op Pain Management:    Induction: Intravenous  Airway Management Planned: Oral ETT  Additional Equipment:   Intra-op Plan:   Post-operative Plan: Extubation in OR  Informed Consent: I have reviewed the patients History and Physical, chart, labs and discussed the procedure including the risks, benefits and alternatives for the proposed anesthesia with the patient or authorized representative who has indicated his/her understanding and acceptance.   Dental advisory given  Plan Discussed with: CRNA  Anesthesia Plan Comments: (Risks/benefits of general anesthesia discussed with patient including risk of damage to teeth, lips, gum, and tongue, nausea/vomiting, allergic reactions to medications, and the possibility of heart attack, stroke and death.  All patient questions answered.  Patient wishes to proceed.)        Anesthesia Quick Evaluation                                  Anesthesia Evaluation  Patient identified by MRN, date of birth, ID band Patient awake    Reviewed: Allergy & Precautions, H&P , NPO status , Patient's Chart, lab work & pertinent test results  History of Anesthesia Complications Negative for: history of anesthetic complications  Airway Mallampati: IV  TM Distance: <3 FB Neck ROM: full   Comment: Small mouth opening Dental no notable dental hx.    Pulmonary shortness of breath,    Pulmonary exam normal breath sounds clear to auscultation       Cardiovascular hypertension, + CAD, + CABG and + Peripheral Vascular Disease  Normal cardiovascular exam+ dysrhythmias  Atrial Fibrillation  Rhythm:regular Rate:Normal     Neuro/Psych Depression CVA    GI/Hepatic negative GI ROS, Neg liver ROS,   Endo/Other  diabetes, Poorly Controlled  Renal/GU CRFRenal disease     Musculoskeletal   Abdominal   Peds  Hematology  (+) anemia ,   Anesthesia Other Findings I have reviewed our anesthesiology preop note -last Echo  with normal EF - stage V renal failure not on dialysis  Reproductive/Obstetrics negative OB ROS                            Anesthesia Physical Anesthesia Plan  ASA: IV  Anesthesia Plan: General   Post-op Pain Management:    Induction: Intravenous  Airway Management Planned: Oral ETT  Additional Equipment: Arterial line and None  Intra-op Plan:   Post-operative Plan: Extubation in OR  Informed Consent: I have reviewed the patients History and Physical, chart, labs and discussed the procedure including the risks, benefits and alternatives for the proposed anesthesia with the patient or authorized representative who has indicated his/her understanding and acceptance.   Dental Advisory Given  Plan Discussed with: Anesthesiologist, CRNA and Surgeon  Anesthesia Plan Comments:         Anesthesia Quick Evaluation

## 2015-12-02 LAB — CBC
HEMATOCRIT: 28.3 % — AB (ref 36.0–46.0)
HEMOGLOBIN: 9.1 g/dL — AB (ref 12.0–15.0)
MCH: 31.1 pg (ref 26.0–34.0)
MCHC: 32.2 g/dL (ref 30.0–36.0)
MCV: 96.6 fL (ref 78.0–100.0)
Platelets: 120 10*3/uL — ABNORMAL LOW (ref 150–400)
RBC: 2.93 MIL/uL — AB (ref 3.87–5.11)
RDW: 16.1 % — ABNORMAL HIGH (ref 11.5–15.5)
WBC: 9.4 10*3/uL (ref 4.0–10.5)

## 2015-12-02 LAB — GLUCOSE, CAPILLARY
GLUCOSE-CAPILLARY: 81 mg/dL (ref 65–99)
GLUCOSE-CAPILLARY: 126 mg/dL — AB (ref 65–99)
GLUCOSE-CAPILLARY: 185 mg/dL — AB (ref 65–99)
Glucose-Capillary: 193 mg/dL — ABNORMAL HIGH (ref 65–99)

## 2015-12-02 LAB — BASIC METABOLIC PANEL
Anion gap: 11 (ref 5–15)
BUN: 62 mg/dL — AB (ref 6–20)
CHLORIDE: 101 mmol/L (ref 101–111)
CO2: 24 mmol/L (ref 22–32)
Calcium: 8.2 mg/dL — ABNORMAL LOW (ref 8.9–10.3)
Creatinine, Ser: 2.71 mg/dL — ABNORMAL HIGH (ref 0.44–1.00)
GFR calc Af Amer: 19 mL/min — ABNORMAL LOW (ref >60)
GFR calc non Af Amer: 16 mL/min — ABNORMAL LOW (ref >60)
Glucose, Bld: 87 mg/dL (ref 65–99)
POTASSIUM: 3.5 mmol/L (ref 3.5–5.1)
SODIUM: 136 mmol/L (ref 135–145)

## 2015-12-02 MED ORDER — OXYCODONE-ACETAMINOPHEN 5-325 MG PO TABS: 1.0000 | ORAL_TABLET | ORAL | Status: DC | PRN

## 2015-12-02 NOTE — Progress Notes (Signed)
Pt informed RN she needed to void and have not voided since this am; pt assisted to bsc and she voided 42ml; pt bladder scanned for 842ml; pt voided 239ml in bsc and I&O cathed for 683ml at 1830. Pt in bed comfortably with call light within reach and family at bedside. Will report off to oncoming RN.

## 2015-12-02 NOTE — Progress Notes (Signed)
Occupational Therapy Evaluation Patient Details Name: [name] MRN: [id number] DOB: October 19, 1940 Today's Date: 12/02/2015    History of Present Illness Pt. admitted with LBP and L LE radiculopathy, underwent L5-S1 decompression and bilateral microdiscectomies.  Pt. with h/o glaucoma, CAD, afib, h/o TIA, CKD, DM, MI.  Pt. 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     Clinical Impression   PTA, pt required assistance for dressing and IADLs from her daughter or husband and used [facility] for community mobility. Pt currently requires max assist for LB ADLs and min-mod assist for functional transfers and ambulation. Began education on back precautions and compensatory strategies for ADLs. Also had a lengthy conversation with pt's husband and daughter about level of assistance pt requires, pt's precautions, activity restrictions and gradual activity progression. Pt will benefit from continued acute OT to increase independence and safety with ADLs and mobility to allow for safe discharge home. Recommend HHOT and shower seat for home use.     Follow Up Recommendations  Home health OT;Supervision/Assistance - 24 hour    Equipment Recommendations  Tub/shower seat;Other (comment) (Adaptive equipment - pt to purchase if desired)    Recommendations for Other Services       Precautions / Restrictions Precautions Precautions: Back;Fall Precaution Booklet Issued: Yes (comment) Precaution Comments: Educated pt on back precautions and reviewed handout Restrictions Weight Bearing Restrictions: No      Mobility Bed Mobility Overal bed mobility:  (not tested; pt. in recliner chair)             General bed mobility comments: Pt up in chair on OT arrival  Transfers Overall transfer level: Needs assistance Equipment used: Rolling walker (2 wheeled) Transfers: Sit to/from Stand Sit to Stand: Min assist;Mod assist         General transfer comment:  Min-mod assist for boost to stand and for balance upon standing. Pt with increased LLE pain when ambulating and noted L knee buckling. Min-min guard assist when ambulating short distances (5-10') with frequent rest breaks required.    Balance Overall balance assessment: Needs assistance Sitting-balance support: No upper extremity supported;Feet supported Sitting balance-Leahy Scale: Fair     Standing balance support: Bilateral upper extremity supported;During functional activity Standing balance-Leahy Scale: Poor Standing balance comment: Heavy reliance on UE support from RW or other surfaces to maintain balance                            ADL Overall ADL's : Needs assistance/impaired     Grooming: Wash/dry hands;Set up;Sitting   Upper Body Bathing: Minimal assitance;Sitting   Lower Body Bathing: Maximal assistance;Sit to/from stand Lower Body Bathing Details (indicate cue type and reason): assist for sit-stand and to reach LB Upper Body Dressing : Minimal assistance;Sitting   Lower Body Dressing: Maximal assistance;Sit to/from stand Lower Body Dressing Details (indicate cue type and reason): assist for sit-stand, to reach LB - pt unable to cross ankle-over-knee Toilet Transfer: Minimal assistance;Cueing for safety;Ambulation;BSC;RW Toilet Transfer Details (indicate cue type and reason): BSC over toilet, cu8es to feel BSC on back of legs before reaching back to sit to avoid twisting Toileting- Clothing Manipulation and Hygiene: Minimal assistance;Sit to/from stand       Functional mobility during ADLs: Minimal assistance;Rolling walker;Cueing for safety General ADL Comments: Pt fatigues very quickly and requires frequent rest breaks. Began education on back precautions during functional activities and compensatory strategies for ADLs. Husband and  daughter present towards end of OT session - lengthy discussion with family about pt's precautions, activity restrictions,  gradual increase in activity level, and level of assistance pt will require.     Vision Vision Assessment?: No apparent visual deficits   Perception     Praxis      Pertinent Vitals/Pain Pain Assessment: 0-10 Pain Score: 7  Pain Location: back with movement Pain Descriptors / Indicators: Aching;Grimacing;Sore Pain Intervention(s): Limited activity within patient's tolerance;Premedicated before session;Monitored during session;Repositioned     Hand Dominance Right   Extremity/Trunk Assessment Upper Extremity Assessment Upper Extremity Assessment: Overall WFL for tasks assessed   Lower Extremity Assessment Lower Extremity Assessment: Generalized weakness   Cervical / Trunk Assessment Cervical / Trunk Assessment: Other exceptions Cervical / Trunk Exceptions: s/p lumbar surgery; forward head and rounded shoulders   Communication Communication Communication: HOH (using hearing aids)   Cognition Arousal/Alertness: Awake/alert Behavior During Therapy: WFL for tasks assessed/performed Overall Cognitive Status: Within Functional Limits for tasks assessed                     General Comments       Exercises       Shoulder Instructions      Home Living Family/patient expects to be discharged to:: Private residence Living Arrangements: Spouse/significant other Available Help at Discharge: Family;Available 24 hours/day (husband and daughter) Type of Home: House Home Access: Level entry     Home Layout: Two level;Able to live on main level with bedroom/bathroom (basement is main level (no stairs to enter)) Alternate Level Stairs-Number of Steps: flight   Bathroom Shower/Tub: Walk-in shower;Door   Bathroom Toilet: Handicapped height Bathroom Accessibility: Yes   Home Equipment: Environmental consultant - 4 wheels;Walker - 2 wheels;Cane - single point;Bedside commode;Grab bars - toilet   Additional Comments: Daughter lives in a house behind pt      Prior  Functioning/Environment Level of Independence: Needs assistance  Gait / Transfers Assistance Needed: frequent falls due to imbalance; only uses cane when goes out; uses no device inside ADL's / Homemaking Assistance Needed: Daughter assists with dressing and IADLs        OT Diagnosis: Generalized weakness;Acute pain   OT Problem List: Decreased strength;Decreased range of motion;Decreased activity tolerance;Impaired balance (sitting and/or standing);Decreased coordination;Decreased safety awareness;Decreased knowledge of precautions;Decreased knowledge of use of DME or AE;Obesity;Pain   OT Treatment/Interventions: Self-care/ADL training;Therapeutic exercise;Energy conservation;DME and/or AE instruction;Therapeutic activities;Patient/family education;Balance training    OT Goals(Current goals can be found in the care plan section) Acute Rehab OT Goals Patient Stated Goal: to be able to clean my house and go shopping without pain OT Goal Formulation: With patient Time For Goal Achievement: 12/16/15 Potential to Achieve Goals: Good ADL Goals Pt Will Perform Grooming: with supervision;standing Pt Will Perform Lower Body Bathing: with supervision;with adaptive equipment;sit to/from stand Pt Will Perform Lower Body Dressing: with supervision;with adaptive equipment;sit to/from stand Pt Will Transfer to Toilet: with supervision;ambulating;bedside commode (BSC over toilet) Pt Will Perform Toileting - Clothing Manipulation and hygiene: with supervision;sitting/lateral leans;sit to/from stand Pt Will Perform Tub/Shower Transfer: Shower transfer;with supervision;ambulating;shower seat;rolling walker  OT Frequency: Min 3X/week   Barriers to D/C: Decreased caregiver support  Pt's husband is a Psychologist, sport and exercise and reports he can provide constant assistance, but would like to work with him to be sure he can provide necessary level of physical assistance as he has had several back surgeries of his own.        Co-evaluation  End of Session Equipment Utilized During Treatment: Gait belt;Rolling walker Nurse Communication: Mobility status;Other (comment) (Pt will need HHOT/PT as much therapy as possible prior d/c)  Activity Tolerance: Patient limited by pain;Patient limited by fatigue Patient left: in chair;with call bell/phone within reach;with family/visitor present   Time: 0920-0959 OT Time Calculation (min): 39 min Charges:  OT General Charges $OT Visit: 1 Procedure OT Evaluation $OT Eval Moderate Complexity: 1 Procedure OT Treatments $Self Care/Home Management : 23-37 mins G-Codes:    Redmond Baseman, OTR/L Pager: [phone] 12/02/2015, 11:06 AM

## 2015-12-02 NOTE — Op Note (Signed)
NAMEJAKLYN, SOUFFRANT               ACCOUNT NO.:  [id number]  MEDICAL RECORD NO.:  [id number]  LOCATION:  [id number]                        FACILITY:  [facility]  PHYSICIAN:  Kiaja Shorty C. Lorin Mercy, M.D.    DATE OF BIRTH:  Oct 16, 1940  DATE OF PROCEDURE:  12/01/2015 DATE OF DISCHARGE:                              OPERATIVE REPORT   PREOPERATIVE DIAGNOSIS:  Bilateral herniation L5-S1.  POSTOPERATIVE DIAGNOSIS:  Bilateral herniation L5-S1.  PROCEDURE:  Bilateral L5-S1 microdiskectomy.  SURGEON:  Rodell Perna, MD  ASSISTANT:  Benjiman Core, PA-C.medically necessary and presnet for the entire procedure  ESTIMATED BLOOD LOSS:  Less than 200.  DRAINS:  None.  DESCRIPTION OF PROCEDURE:  After induction of general anesthesia, the patient was intubated and placed prone.  Preoperative Ancef was given.  Prepping with DuraPrep.  A time-out procedure was completed, while drying the area squared with towels, Betadine, Steri-Drape applied. Laminectomy sheet and drape.  Midline incision was made after needle localization.  Cross-table lateral x-ray showed the needle was just even with the L5 pedicle above the L5-S1 disc space.  Incision started at this level at midline and extended caudally.  Subperiosteal dissection and then a Kocher clamp was placed directly down over spinous process pointing exactly at the L5-S1 level, confirmed a 2nd radiograph. Laminotomy was performed on both sides at L5 and thinning of the top portion of S1.  Overhanging spurs were resected back.  Thick chunks of ligament were removed and disk material was teased from lateral to medial.  Both sides would only allow Penfield 4 into the disk space due to narrowing, collapse of the disk space and some osteophytes.  These were opened up with a 2 mm Kerrison as well as an osteotome on the left side and chunks of ligament were teased from out the foramina, pushed back down the middle of the disc and then removed with micro pituitaries.  Nerve  root was free running around the pedicle on both sides.  There were thicker ligaments on the right than the left.  More disc out laterally on the left than right.  Continued decompression until disc material had been removed and nerve root was free.  Both sides were irrigated and standard #1 Vicryl in the deep fascia, 2-0 Vicryl subcutaneous tissue, subcuticular closure, Dermabond on the skin,  Marcaine infiltration 0.25% plain.  Instrument count and needle count was correct.     Braya Habermehl C. Lorin Mercy, M.D.     MCY/MEDQ  D:  12/01/2015  T:  12/02/2015  Job:  [id number]

## 2015-12-02 NOTE — Progress Notes (Signed)
Patient ID: [name], female   DOB: Jan 30, 1941, 75 y.o.   MRN: [id number] Postoperative day 1 lumbar laminectomy with decompression. Patient is comfortable this morning without complaints. Her hemoglobin is dropped to 9.1. Her BUN and creatinine are elevated but essentially unchanged from her preoperative status. Plan for discharge to home today prescription provided for Percocet. Follow-up with Dr. Lorin Mercy in 1 week.

## 2015-12-02 NOTE — Progress Notes (Signed)
Received report from previous RN that PT does not recommend patient being released today. MD Sharol Given called by this RN- he states to keep the patient for another day.

## 2015-12-02 NOTE — Care Management Note (Signed)
Case Management Note  Patient Details  Name: KIANI BESSELMAN MRN: [id number] Date of Birth: 1941-01-11  Subjective/Objective:    75 yr old female s/p lumbar laminectomy with decompression.                Action/Plan: Case manager spoke with patient and several family members concerning home health and DME needs. Patient is very [place]. Choice was offered for [place]. Referral was called to [place] [place] Specialist. Patient states she has a rolling walker, 3in1 and cane. Will have family support at discharge.       Expected Discharge Date:    12/03/15              Expected Discharge Plan:  [place]  In-House Referral:  NA  Discharge planning Services  CM Consult  Post Acute Care Choice:  NA Choice offered to:  Patient  DME Arranged:  N/A DME Agency:  NA  HH Arranged:  PT, OT [place] Agency:  [place]  Status of Service:  Completed, signed off  Medicare Important Message Given:    Date Medicare IM Given:    Medicare IM give by:    Date Additional Medicare IM Given:    Additional Medicare Important Message give by:     If discussed at [place] of Stay Meetings, dates discussed:    Additional Comments:  Ninfa Meeker, RN 12/02/2015, 1:32 PM

## 2015-12-02 NOTE — Evaluation (Addendum)
Physical Therapy Evaluation Patient Details Name: [name] MRN: [id number] DOB: 01-10-1941 Today's Date: 12/02/2015   History of Present Illness  Pt. admitted with LBP and L LE radiculopathy, underwent L5-S1 decompression and bilateral microdiscectomies.  Pt. with h/o glaucoma, CAD, afib, h/o TIA, CKD, DM, MI.  Pt. 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    Clinical Impression  Pt. Presents to PT with decreased safety and functional mobility/gait with persistent pain LLE following her back surgery.  She will benefit from acute PT to address these and below deficits.  As of this PT session, pt. Currently not functioning at household level of ambulation yet.  May benefit from remaining inhouse for further therapy to increase independence for DC home.  I have discussed with Ricki Miller, RN CM as well as pt's Margreta Journey.  Pt. Will need HHPT once she is DC'd.  I have requested that pt's RN contact MD to see if he is agreeable to order HHPT and if he feels appropriate for pt's DC to be delayed today.  PT will follow pt. Tomorrow if she remains in house overnight.  I advised family several times that pt. Is not currently safe enough to be left alone at home and that recommendation is for 24/7 assist/supervision.  Family indicates understanding.    Follow Up Recommendations Home health PT;Supervision/Assistance - 24 hour    Equipment Recommendations  None recommended by PT (pt. reports she has RW in the home)    Recommendations for Other Services       Precautions / Restrictions Precautions Precautions: Back Precaution Booklet Issued: No (OT had provided info earlier this am) Precaution Comments: reviewed/reinforced  back precautions with pt. and family      Mobility  Bed Mobility Overal bed mobility:  (not tested; pt. in recliner chair)                Transfers Overall transfer level: Needs assistance Equipment used: Rolling walker (2  wheeled) Transfers: Sit to/from Stand Sit to Stand: Min assist         General transfer comment: Pt. needed verbal directions for best posture in sit<>stand ; needed min and min guard assist for transitional movements  Ambulation/Gait Ambulation/Gait assistance: Min assist Ambulation Distance (Feet): 5 Feet (5' then 12' with seated rest) Assistive device: Rolling walker (2 wheeled) Gait Pattern/deviations: Step-to pattern;Decreased stride length;Shuffle (shuffles right foot) Gait velocity: decreased   General Gait Details: Pt. with limited tolerance to gait due to increasing pain in low back and especially L LE when up. Needs min and min guard assist for stability/safety  Stairs            Wheelchair Mobility    Modified Rankin (Stroke Patients Only)       Balance Overall balance assessment: Needs assistance Sitting-balance support: No upper extremity supported;Feet supported Sitting balance-Leahy Scale: Good     Standing balance support: Bilateral upper extremity supported;During functional activity Standing balance-Leahy Scale: Poor Standing balance comment: poor standing balance due to need for RW support                             Pertinent Vitals/Pain Pain Assessment: 0-10 Pain Score: 4  Pain Location: pain is a level 4 in low back and L leg when seated; pain increases to 7/10 when standing upright and with walking Pain Descriptors / Indicators: Aching;Operative site guarding;Discomfort;Shooting Pain Intervention(s): Limited activity within  patient's tolerance;Monitored during session;Repositioned;Patient requesting pain meds-RN notified    Home Living Family/patient expects to be discharged to:: Private residence Living Arrangements: Spouse/significant other Available Help at Discharge: Family;Available 24 hours/day Type of Home: House Home Access: Level entry     Home Layout: Two level;Able to live on main level with bedroom/bathroom Home  Equipment: Walker - 4 wheels;Cane - single point;Bedside commode      Prior Function Level of Independence: Needs assistance   Gait / Transfers Assistance Needed: frequent falls due to imbalance; only uses cane when goes out; uses no device inside           Hand Dominance        Extremity/Trunk Assessment   Upper Extremity Assessment: Defer to OT evaluation           Lower Extremity Assessment: Generalized weakness      Cervical / Trunk Assessment: Normal  Communication   Communication: HOH (uses hearing aids)  Cognition Arousal/Alertness: Awake/alert Behavior During Therapy: WFL for tasks assessed/performed Overall Cognitive Status: Within Functional Limits for tasks assessed                      General Comments      Exercises        Assessment/Plan    PT Assessment Patient needs continued PT services  PT Diagnosis Difficulty walking;Generalized weakness;Acute pain   PT Problem List Decreased strength;Decreased activity tolerance;Decreased balance;Decreased mobility;Decreased knowledge of use of DME;Decreased knowledge of precautions;Pain  PT Treatment Interventions DME instruction;Gait training;Functional mobility training;Therapeutic activities;Balance training;Patient/family education   PT Goals (Current goals can be found in the Care Plan section) Acute Rehab PT Goals Patient Stated Goal: walk without so much pain PT Goal Formulation: With patient Time For Goal Achievement: 12/09/15 Potential to Achieve Goals: Good    Frequency Min 6X/week   Barriers to discharge        Co-evaluation               End of Session Equipment Utilized During Treatment: Gait belt Activity Tolerance: Patient limited by pain Patient left: in chair;with call bell/phone within reach;with family/visitor present (husband and daughter present) Nurse Communication: Mobility status         Time: [id number] PT Time Calculation (min) (ACUTE ONLY): 25  min   Charges:   PT Evaluation $PT Eval Moderate Complexity: 1 Procedure PT Treatments $Gait Training: 8-22 mins   PT G CodesLadona Ridgel 12/02/2015, 10:42 AM Gerlean Ren PT Acute Rehab Services [phone]

## 2015-12-03 LAB — GLUCOSE, CAPILLARY
GLUCOSE-CAPILLARY: 172 mg/dL — AB (ref 65–99)
GLUCOSE-CAPILLARY: 187 mg/dL — AB (ref 65–99)
Glucose-Capillary: 210 mg/dL — ABNORMAL HIGH (ref 65–99)
Glucose-Capillary: 201 mg/dL — ABNORMAL HIGH (ref 65–99)

## 2015-12-03 LAB — BASIC METABOLIC PANEL
Anion gap: 11 (ref 5–15)
BUN: 63 mg/dL — ABNORMAL HIGH (ref 6–20)
CALCIUM: 8.3 mg/dL — AB (ref 8.9–10.3)
CHLORIDE: 99 mmol/L — AB (ref 101–111)
CO2: 22 mmol/L (ref 22–32)
Creatinine, Ser: 3.07 mg/dL — ABNORMAL HIGH (ref 0.44–1.00)
GFR calc non Af Amer: 14 mL/min — ABNORMAL LOW (ref >60)
GFR, EST AFRICAN AMERICAN: 16 mL/min — AB (ref >60)
Glucose, Bld: 170 mg/dL — ABNORMAL HIGH (ref 65–99)
Potassium: 3.7 mmol/L (ref 3.5–5.1)
SODIUM: 132 mmol/L — AB (ref 135–145)

## 2015-12-03 LAB — CBC
HEMATOCRIT: 28.4 % — AB (ref 36.0–46.0)
HEMOGLOBIN: 9.2 g/dL — AB (ref 12.0–15.0)
MCH: 31 pg (ref 26.0–34.0)
MCHC: 32.4 g/dL (ref 30.0–36.0)
MCV: 95.6 fL (ref 78.0–100.0)
Platelets: 130 10*3/uL — ABNORMAL LOW (ref 150–400)
RBC: 2.97 MIL/uL — ABNORMAL LOW (ref 3.87–5.11)
RDW: 15.5 % (ref 11.5–15.5)
WBC: 11.9 10*3/uL — AB (ref 4.0–10.5)

## 2015-12-03 NOTE — Progress Notes (Signed)
Patient ID: [name], female   DOB: [birth year], 75 y.o.   MRN: [id number]   Reviewed patients notes.  PT recommending rehab consult and this was ordered.  I spoke with Ginger RN and advised that I would also be putting in social work consult for possible SNF placement if rehab bed not available.

## 2015-12-03 NOTE — Progress Notes (Signed)
Occupational Therapy Treatment Patient Details Name: [name] MRN: [id number] DOB: 1941-06-24 Today's Date: 12/03/2015    History of present illness Pt. admitted with LBP and L LE radiculopathy, underwent L5-S1 decompression and bilateral microdiscectomies.  Pt. with h/o glaucoma, CAD, afib, h/o TIA, CKD, DM, MI.  Pt. 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     OT comments  Pt. Motivated for participation in skilled OT this am, requesting oob upon arrival into room.  Moving slow but able to complete bed mobility without physical assist from therapist asst.  Declined A/E reporting family able to assist with LB ADLS.  Will continue to follow acutely.    Follow Up Recommendations  Home health OT;Supervision/Assistance - 24 hour    Equipment Recommendations  Tub/shower seat;Other (comment)    Recommendations for Other Services      Precautions / Restrictions Precautions Precautions: Back;Fall Precaution Comments: Educated pt on back precautions and reviewed handout       Mobility Bed Mobility Overal bed mobility: Needs Assistance Bed Mobility: Rolling;Sidelying to Sit           General bed mobility comments: HOB flat, intemittent use of bed rail, exited on R side. stating "im weak i cant, just give me your arm", max encouragement to push through elbow. pt. able to with increased time and bring trunk upright without any assistance.  also to scoot hips towards eob  Transfers Overall transfer level: Needs assistance Equipment used: Rolling walker (2 wheeled) Transfers: Sit to/from [facility] Sit to Stand: Min guard Stand pivot transfers: Min guard       General transfer comment: cues to push from surface and reach for surface during sit/stand and stand/sit.    Balance                                   ADL Overall ADL's : Needs assistance/impaired     Grooming: Wash/dry hands;Sitting;Set up          Lower Body Bathing Details (indicate cue type and reason): pt. states she is unable to cross legs over knees at this time, states her spouse will be able to assist at home with LB ADLS       Lower Body Dressing Details (indicate cue type and reason): pt. states she is unable to cross legs over knees at this time, states her spouse will be able to assist at home with LB ADLS Toilet Transfer: Min guard;RW;Ambulation Toilet Transfer Details (indicate cue type and reason): simulated with in room transfer from eob, then ambulation to recliner (declined b.room stating she had just finished using the b.room) Toileting- Clothing Manipulation and Hygiene: Minimal assistance;Sit to/from stand Toileting - Clothing Manipulation Details (indicate cue type and reason): simulated during transfer     Functional mobility during ADLs: Min guard;Cueing for sequencing;Rolling walker General ADL Comments: reviewed back precautions, a little more difficult this session as pt. reports her family took her hearing aides home with them last night so therapist asst. had to speak very loud and provide demonstration cues for instructions and back precautions      Vision                     Perception     Praxis      Cognition   Behavior During Therapy: [facility] for tasks assessed/performed Overall Cognitive Status: Within Functional  Limits for tasks assessed                       Extremity/Trunk Assessment               Exercises     Shoulder Instructions       General Comments      Pertinent Vitals/ Pain       Pain Assessment:  (did not rate but states "its there" when asked about pain) Pain Location: back Pain Intervention(s): Repositioned  Home Living                                          Prior Functioning/Environment              Frequency Min 3X/week     Progress Toward Goals  OT Goals(current goals can now be found in the care plan  section)  Progress towards OT goals: Progressing toward goals     Plan Discharge plan remains appropriate    Co-evaluation                 End of Session Equipment Utilized During Treatment: Gait belt;Rolling walker (pt. reports rn took o2 away yesterday and she has not had it on )   Activity Tolerance Patient tolerated treatment well   Patient Left in chair;with call bell/phone within reach   Nurse Communication          Time: [id number] OT Time Calculation (min): 12 min  Charges: OT General Charges $OT Visit: 1 Procedure OT Treatments $Self Care/Home Management : 8-22 mins  Janice Coffin , COTA/L 12/03/2015, 7:07 AM

## 2015-12-03 NOTE — Progress Notes (Addendum)
Physical Therapy Treatment Patient Details Name: [name] MRN: [id number] DOB: [birth year] Today's Date: 12/03/2015    History of Present Illness Pt. admitted with LBP and L LE radiculopathy, underwent L5-S1 decompression and bilateral microdiscectomies.  Pt. with h/o glaucoma, CAD, afib, h/o TIA, CKD, DM, MI.  Pt. 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      PT Comments    Patient continues to be very limited in mobility due to low back pain and pain shooting down leg.  Patient required less assist for mobility today, but still unable to ambulate further than 5 feet before pain reached "excruitiating" level (10/10 per patient).   Patient motivated to increase mobility but limited by intense pain.  Do not feel patient ready for discharge at this time.  Lives with husband who is limited in his ability and cannot "lift" her.  Feel patient may benefit from CIR prior to discharge to increase mobility and independence and reach level for return to home.  Will benefit from continued PT to progress mobility.     Follow Up Recommendations  CIR     Equipment Recommendations  None recommended by PT    Recommendations for Other Services       Precautions / Restrictions Precautions Precautions: Back;Fall Precaution Comments: Educated pt on back precautions and reviewed handout    Mobility  Bed Mobility Overal bed mobility: Needs Assistance Bed Mobility: Rolling;Sidelying to Sit           General bed mobility comments: sitting in recliner upon arrival  Transfers Overall transfer level: Needs assistance Equipment used: Rolling walker (2 wheeled) Transfers: Sit to/from Stand Sit to Stand: Min guard Stand pivot transfers: Min guard       General transfer comment: min guard for safety  Ambulation/Gait Ambulation/Gait assistance: Min assist Ambulation Distance (Feet): 5 Feet (x 2) Assistive device: Rolling walker (2 wheeled) Gait  Pattern/deviations: Step-to pattern;Decreased stride length Gait velocity: decreased   General Gait Details: Pt. with limited tolerance to gait due to increasing pain in low back.  Needs assist for stability/safety   Stairs            Wheelchair Mobility    Modified Rankin (Stroke Patients Only)       Balance   Sitting-balance support: No upper extremity supported;Feet supported Sitting balance-Leahy Scale: Fair     Standing balance support: Bilateral upper extremity supported Standing balance-Leahy Scale: Poor Standing balance comment: reliant on RW for balance                    Cognition Arousal/Alertness: Awake/alert Behavior During Therapy: WFL for tasks assessed/performed Overall Cognitive Status: Within Functional Limits for tasks assessed                      Exercises General Exercises - Lower Extremity Ankle Circles/Pumps: AROM;Both;10 reps;Seated Long Arc Quad: AROM;Both;10 reps;Seated;Limitations Long [employer] Limitations: instructed patient to LAQ within painfree range to mobilize sciatic nerve    General Comments        Pertinent Vitals/Pain Pain Assessment: 0-10 Pain Score: 10-Worst pain ever Pain Location: back Pain Descriptors / Indicators: Aching;Grimacing;Sore;Shooting Pain Intervention(s): Limited activity within patient's tolerance;Monitored during session;Premedicated before session    Home Living                      Prior Function            PT  Goals (current goals can now be found in the care plan section) Progress towards PT goals: Progressing toward goals    Frequency  Min 6X/week    PT Plan Current plan remains appropriate    Co-evaluation             End of Session Equipment Utilized During Treatment: Gait belt Activity Tolerance: Patient limited by pain Patient left: in chair;with call bell/phone within reach     Time: 0900-0922 PT Time Calculation (min) (ACUTE ONLY): 22  min  Charges:  $Gait Training: 8-22 mins                    G Codes:      Shanna Cisco December 21, 2015, 9:49 AM Dec 21, 2015 [place], [place]

## 2015-12-03 NOTE — Progress Notes (Signed)
Patient ID: [name], female   DOB: 06-07-1941, 75 y.o.   MRN: [id number] Patient is sitting up eating this morning breakfast she is comfortable. Patient made slow progress with therapy yesterday and was not safe for discharge to home. We will reevaluate for discharge to home today after physical therapy. orders written for discharge.

## 2015-12-04 ENCOUNTER — Inpatient Hospital Stay (HOSPITAL_COMMUNITY)
Admission: RE | Admit: 2015-12-04 | Discharge: 2015-12-15 | DRG: 092 | Disposition: A | Discharge status: Home Health | Payer: Medicare Other | Source: Intra-hospital | Attending: Physical Medicine & Rehabilitation | Admitting: Physical Medicine & Rehabilitation

## 2015-12-04 ENCOUNTER — Inpatient Hospital Stay (HOSPITAL_COMMUNITY): Payer: Medicare Other

## 2015-12-04 DIAGNOSIS — I129 Hypertensive chronic kidney disease with stage 1 through stage 4 chronic kidney disease, or unspecified chronic kidney disease: Secondary | ICD-10-CM | POA: Diagnosis present

## 2015-12-04 DIAGNOSIS — Z8739 Personal history of other diseases of the musculoskeletal system and connective tissue: Secondary | ICD-10-CM | POA: Insufficient documentation

## 2015-12-04 DIAGNOSIS — E1142 Type 2 diabetes mellitus with diabetic polyneuropathy: Secondary | ICD-10-CM | POA: Diagnosis not present

## 2015-12-04 DIAGNOSIS — N189 Chronic kidney disease, unspecified: Secondary | ICD-10-CM

## 2015-12-04 DIAGNOSIS — M7989 Other specified soft tissue disorders: Secondary | ICD-10-CM | POA: Diagnosis not present

## 2015-12-04 DIAGNOSIS — M62838 Other muscle spasm: Secondary | ICD-10-CM | POA: Diagnosis present

## 2015-12-04 DIAGNOSIS — I252 Old myocardial infarction: Secondary | ICD-10-CM | POA: Diagnosis not present

## 2015-12-04 DIAGNOSIS — N179 Acute kidney failure, unspecified: Secondary | ICD-10-CM | POA: Diagnosis not present

## 2015-12-04 DIAGNOSIS — R339 Retention of urine, unspecified: Secondary | ICD-10-CM | POA: Diagnosis present

## 2015-12-04 DIAGNOSIS — N184 Chronic kidney disease, stage 4 (severe): Secondary | ICD-10-CM | POA: Diagnosis present

## 2015-12-04 DIAGNOSIS — G8918 Other acute postprocedural pain: Secondary | ICD-10-CM | POA: Insufficient documentation

## 2015-12-04 DIAGNOSIS — M79661 Pain in right lower leg: Secondary | ICD-10-CM

## 2015-12-04 DIAGNOSIS — Z8639 Personal history of other endocrine, nutritional and metabolic disease: Secondary | ICD-10-CM

## 2015-12-04 DIAGNOSIS — M25521 Pain in right elbow: Secondary | ICD-10-CM

## 2015-12-04 DIAGNOSIS — I10 Essential (primary) hypertension: Secondary | ICD-10-CM

## 2015-12-04 DIAGNOSIS — M79662 Pain in left lower leg: Secondary | ICD-10-CM

## 2015-12-04 DIAGNOSIS — D631 Anemia in chronic kidney disease: Secondary | ICD-10-CM | POA: Diagnosis present

## 2015-12-04 DIAGNOSIS — Z79899 Other long term (current) drug therapy: Secondary | ICD-10-CM | POA: Diagnosis not present

## 2015-12-04 DIAGNOSIS — E1140 Type 2 diabetes mellitus with diabetic neuropathy, unspecified: Secondary | ICD-10-CM | POA: Diagnosis present

## 2015-12-04 DIAGNOSIS — Z8673 Personal history of transient ischemic attack (TIA), and cerebral infarction without residual deficits: Secondary | ICD-10-CM

## 2015-12-04 DIAGNOSIS — H9190 Unspecified hearing loss, unspecified ear: Secondary | ICD-10-CM | POA: Diagnosis present

## 2015-12-04 DIAGNOSIS — Z7982 Long term (current) use of aspirin: Secondary | ICD-10-CM

## 2015-12-04 DIAGNOSIS — I480 Paroxysmal atrial fibrillation: Secondary | ICD-10-CM | POA: Diagnosis present

## 2015-12-04 DIAGNOSIS — D62 Acute posthemorrhagic anemia: Secondary | ICD-10-CM | POA: Diagnosis not present

## 2015-12-04 DIAGNOSIS — N185 Chronic kidney disease, stage 5: Secondary | ICD-10-CM

## 2015-12-04 DIAGNOSIS — I481 Persistent atrial fibrillation: Secondary | ICD-10-CM | POA: Diagnosis not present

## 2015-12-04 DIAGNOSIS — Z881 Allergy status to other antibiotic agents status: Secondary | ICD-10-CM

## 2015-12-04 DIAGNOSIS — M5416 Radiculopathy, lumbar region: Secondary | ICD-10-CM | POA: Diagnosis present

## 2015-12-04 DIAGNOSIS — Z8249 Family history of ischemic heart disease and other diseases of the circulatory system: Secondary | ICD-10-CM | POA: Diagnosis not present

## 2015-12-04 DIAGNOSIS — E871 Hypo-osmolality and hyponatremia: Secondary | ICD-10-CM | POA: Diagnosis present

## 2015-12-04 DIAGNOSIS — Z981 Arthrodesis status: Secondary | ICD-10-CM | POA: Diagnosis not present

## 2015-12-04 DIAGNOSIS — R197 Diarrhea, unspecified: Secondary | ICD-10-CM | POA: Diagnosis not present

## 2015-12-04 DIAGNOSIS — D638 Anemia in other chronic diseases classified elsewhere: Secondary | ICD-10-CM | POA: Diagnosis not present

## 2015-12-04 DIAGNOSIS — F419 Anxiety disorder, unspecified: Secondary | ICD-10-CM | POA: Diagnosis present

## 2015-12-04 DIAGNOSIS — M5417 Radiculopathy, lumbosacral region: Secondary | ICD-10-CM

## 2015-12-04 DIAGNOSIS — R062 Wheezing: Secondary | ICD-10-CM

## 2015-12-04 DIAGNOSIS — I950 Idiopathic hypotension: Secondary | ICD-10-CM | POA: Diagnosis not present

## 2015-12-04 DIAGNOSIS — R52 Pain, unspecified: Secondary | ICD-10-CM

## 2015-12-04 DIAGNOSIS — E669 Obesity, unspecified: Secondary | ICD-10-CM | POA: Diagnosis present

## 2015-12-04 DIAGNOSIS — N2581 Secondary hyperparathyroidism of renal origin: Secondary | ICD-10-CM | POA: Diagnosis not present

## 2015-12-04 DIAGNOSIS — D696 Thrombocytopenia, unspecified: Secondary | ICD-10-CM | POA: Insufficient documentation

## 2015-12-04 DIAGNOSIS — E8889 Other specified metabolic disorders: Secondary | ICD-10-CM | POA: Diagnosis present

## 2015-12-04 DIAGNOSIS — E1122 Type 2 diabetes mellitus with diabetic chronic kidney disease: Secondary | ICD-10-CM | POA: Diagnosis present

## 2015-12-04 DIAGNOSIS — Z794 Long term (current) use of insulin: Secondary | ICD-10-CM | POA: Diagnosis not present

## 2015-12-04 DIAGNOSIS — Z888 Allergy status to other drugs, medicaments and biological substances status: Secondary | ICD-10-CM

## 2015-12-04 DIAGNOSIS — I2510 Atherosclerotic heart disease of native coronary artery without angina pectoris: Secondary | ICD-10-CM | POA: Diagnosis present

## 2015-12-04 DIAGNOSIS — Z951 Presence of aortocoronary bypass graft: Secondary | ICD-10-CM | POA: Diagnosis not present

## 2015-12-04 DIAGNOSIS — K219 Gastro-esophageal reflux disease without esophagitis: Secondary | ICD-10-CM | POA: Diagnosis present

## 2015-12-04 DIAGNOSIS — I959 Hypotension, unspecified: Secondary | ICD-10-CM | POA: Diagnosis not present

## 2015-12-04 DIAGNOSIS — I4819 Other persistent atrial fibrillation: Secondary | ICD-10-CM | POA: Insufficient documentation

## 2015-12-04 DIAGNOSIS — Z833 Family history of diabetes mellitus: Secondary | ICD-10-CM

## 2015-12-04 DIAGNOSIS — H409 Unspecified glaucoma: Secondary | ICD-10-CM | POA: Diagnosis present

## 2015-12-04 DIAGNOSIS — R269 Unspecified abnormalities of gait and mobility: Secondary | ICD-10-CM | POA: Diagnosis present

## 2015-12-04 DIAGNOSIS — R109 Unspecified abdominal pain: Secondary | ICD-10-CM | POA: Insufficient documentation

## 2015-12-04 LAB — CBC
HCT: 25.9 % — ABNORMAL LOW (ref 36.0–46.0)
Hemoglobin: 8.4 g/dL — ABNORMAL LOW (ref 12.0–15.0)
MCH: 30.8 pg (ref 26.0–34.0)
MCHC: 32.4 g/dL (ref 30.0–36.0)
MCV: 94.9 fL (ref 78.0–100.0)
PLATELETS: 133 10*3/uL — AB (ref 150–400)
RBC: 2.73 MIL/uL — ABNORMAL LOW (ref 3.87–5.11)
RDW: 15.5 % (ref 11.5–15.5)
WBC: 9.1 10*3/uL (ref 4.0–10.5)

## 2015-12-04 LAB — BASIC METABOLIC PANEL
ANION GAP: 11 (ref 5–15)
BUN: 66 mg/dL — AB (ref 6–20)
CALCIUM: 8.5 mg/dL — AB (ref 8.9–10.3)
CO2: 21 mmol/L — ABNORMAL LOW (ref 22–32)
CREATININE: 3 mg/dL — AB (ref 0.44–1.00)
Chloride: 102 mmol/L (ref 101–111)
GFR calc Af Amer: 16 mL/min — ABNORMAL LOW (ref >60)
GFR, EST NON AFRICAN AMERICAN: 14 mL/min — AB (ref >60)
GLUCOSE: 167 mg/dL — AB (ref 65–99)
Potassium: 3.6 mmol/L (ref 3.5–5.1)
Sodium: 134 mmol/L — ABNORMAL LOW (ref 135–145)

## 2015-12-04 LAB — GLUCOSE, CAPILLARY
Glucose-Capillary: 155 mg/dL — ABNORMAL HIGH (ref 65–99)
Glucose-Capillary: 157 mg/dL — ABNORMAL HIGH (ref 65–99)
Glucose-Capillary: 172 mg/dL — ABNORMAL HIGH (ref 65–99)
Glucose-Capillary: 192 mg/dL — ABNORMAL HIGH (ref 65–99)

## 2015-12-04 MED ORDER — DIPHENHYDRAMINE HCL 12.5 MG/5ML PO ELIX: 12.5000 mg | ORAL_SOLUTION | Freq: Four times a day (QID) | ORAL | Status: DC | PRN

## 2015-12-04 MED ORDER — METOPROLOL TARTRATE 50 MG PO TABS
50.0000 mg | ORAL_TABLET | Freq: Two times a day (BID) | ORAL | Status: DC
  Administered 2015-12-04 – 2015-12-11 (×13): 50 mg via ORAL
  Filled 2015-12-04 (×3): qty 1
  Filled 2015-12-04: qty 2
  Filled 2015-12-04 (×10): qty 1

## 2015-12-04 MED ORDER — MUSCLE RUB 10-15 % EX CREA
TOPICAL_CREAM | Freq: Two times a day (BID) | CUTANEOUS | Status: DC
  Administered 2015-12-04 – 2015-12-12 (×16): via TOPICAL
  Administered 2015-12-12: 1 via TOPICAL
  Administered 2015-12-13 – 2015-12-15 (×5): via TOPICAL
  Filled 2015-12-04: qty 85

## 2015-12-04 MED ORDER — INSULIN ASPART 100 UNIT/ML ~~LOC~~ SOLN
0.0000 [IU] | Freq: Three times a day (TID) | SUBCUTANEOUS | Status: DC
  Administered 2015-12-04: 3 [IU] via SUBCUTANEOUS
  Administered 2015-12-05: 2 [IU] via SUBCUTANEOUS
  Administered 2015-12-05 – 2015-12-06 (×3): 3 [IU] via SUBCUTANEOUS
  Administered 2015-12-06: 2 [IU] via SUBCUTANEOUS
  Administered 2015-12-06: 3 [IU] via SUBCUTANEOUS
  Administered 2015-12-07 (×2): 2 [IU] via SUBCUTANEOUS
  Administered 2015-12-08: 3 [IU] via SUBCUTANEOUS
  Administered 2015-12-08: 2 [IU] via SUBCUTANEOUS
  Administered 2015-12-09: 3 [IU] via SUBCUTANEOUS
  Administered 2015-12-09 – 2015-12-10 (×2): 2 [IU] via SUBCUTANEOUS
  Administered 2015-12-10: 3 [IU] via SUBCUTANEOUS
  Administered 2015-12-11 (×2): 2 [IU] via SUBCUTANEOUS
  Administered 2015-12-12: 3 [IU] via SUBCUTANEOUS
  Administered 2015-12-13 – 2015-12-14 (×2): 2 [IU] via SUBCUTANEOUS

## 2015-12-04 MED ORDER — METHOCARBAMOL 500 MG PO TABS
500.0000 mg | ORAL_TABLET | Freq: Four times a day (QID) | ORAL | Status: DC
  Administered 2015-12-04 – 2015-12-15 (×41): 500 mg via ORAL
  Filled 2015-12-04 (×42): qty 1

## 2015-12-04 MED ORDER — TRAZODONE HCL 50 MG PO TABS
25.0000 mg | ORAL_TABLET | Freq: Every evening | ORAL | Status: DC | PRN
  Administered 2015-12-05: 50 mg via ORAL
  Filled 2015-12-04: qty 1

## 2015-12-04 MED ORDER — AMLODIPINE BESYLATE 10 MG PO TABS
10.0000 mg | ORAL_TABLET | Freq: Every day | ORAL | Status: DC
  Administered 2015-12-05 – 2015-12-07 (×3): 10 mg via ORAL
  Filled 2015-12-04 (×3): qty 1

## 2015-12-04 MED ORDER — HYDROCODONE-ACETAMINOPHEN 10-325 MG PO TABS
1.0000 | ORAL_TABLET | Freq: Four times a day (QID) | ORAL | Status: DC | PRN
  Administered 2015-12-04: 1 via ORAL
  Filled 2015-12-04: qty 1

## 2015-12-04 MED ORDER — PROCHLORPERAZINE EDISYLATE 5 MG/ML IJ SOLN: 5.0000 mg | Freq: Four times a day (QID) | INTRAMUSCULAR | Status: DC | PRN

## 2015-12-04 MED ORDER — TROLAMINE SALICYLATE 10 % EX CREA
TOPICAL_CREAM | Freq: Two times a day (BID) | CUTANEOUS | Status: DC
  Filled 2015-12-04: qty 85

## 2015-12-04 MED ORDER — ALLOPURINOL 100 MG PO TABS
100.0000 mg | ORAL_TABLET | Freq: Two times a day (BID) | ORAL | Status: DC
  Administered 2015-12-04 – 2015-12-15 (×21): 100 mg via ORAL
  Filled 2015-12-04 (×22): qty 1

## 2015-12-04 MED ORDER — PROCHLORPERAZINE 25 MG RE SUPP: 12.5000 mg | Freq: Four times a day (QID) | RECTAL | Status: DC | PRN

## 2015-12-04 MED ORDER — FLEET ENEMA 7-19 GM/118ML RE ENEM: 1.0000 | ENEMA | Freq: Once | RECTAL | Status: DC | PRN

## 2015-12-04 MED ORDER — LATANOPROST 0.005 % OP SOLN
1.0000 [drp] | Freq: Every day | OPHTHALMIC | Status: DC
  Administered 2015-12-04 – 2015-12-14 (×11): 1 [drp] via OPHTHALMIC
  Filled 2015-12-04 (×2): qty 2.5

## 2015-12-04 MED ORDER — MENTHOL 3 MG MT LOZG
1.0000 | LOZENGE | OROMUCOSAL | Status: DC | PRN
  Filled 2015-12-04: qty 9

## 2015-12-04 MED ORDER — OXYCODONE HCL 5 MG PO TABS
10.0000 mg | ORAL_TABLET | ORAL | Status: DC | PRN
  Administered 2015-12-05 – 2015-12-10 (×9): 10 mg via ORAL
  Filled 2015-12-04 (×10): qty 2

## 2015-12-04 MED ORDER — GUAIFENESIN-DM 100-10 MG/5ML PO SYRP: 5.0000 mL | ORAL_SOLUTION | Freq: Four times a day (QID) | ORAL | Status: DC | PRN

## 2015-12-04 MED ORDER — TIMOLOL MALEATE 0.5 % OP SOLN
1.0000 [drp] | Freq: Every day | OPHTHALMIC | Status: DC
  Administered 2015-12-05 – 2015-12-15 (×11): 1 [drp] via OPHTHALMIC
  Filled 2015-12-04: qty 5

## 2015-12-04 MED ORDER — ACETAMINOPHEN 325 MG PO TABS
325.0000 mg | ORAL_TABLET | ORAL | Status: DC | PRN
  Administered 2015-12-10 – 2015-12-11 (×2): 325 mg via ORAL
  Administered 2015-12-12 – 2015-12-14 (×5): 650 mg via ORAL
  Filled 2015-12-04 (×5): qty 2

## 2015-12-04 MED ORDER — INSULIN GLARGINE 100 UNIT/ML ~~LOC~~ SOLN
20.0000 [IU] | Freq: Every day | SUBCUTANEOUS | Status: DC
  Administered 2015-12-04 – 2015-12-13 (×10): 20 [IU] via SUBCUTANEOUS
  Filled 2015-12-04 (×12): qty 0.2

## 2015-12-04 MED ORDER — ALUM & MAG HYDROXIDE-SIMETH 200-200-20 MG/5ML PO SUSP: 30.0000 mL | ORAL | Status: DC | PRN

## 2015-12-04 MED ORDER — FUROSEMIDE 80 MG PO TABS
80.0000 mg | ORAL_TABLET | Freq: Two times a day (BID) | ORAL | Status: DC
  Administered 2015-12-05 – 2015-12-07 (×5): 80 mg via ORAL
  Filled 2015-12-04 (×6): qty 1

## 2015-12-04 MED ORDER — TRAMADOL HCL 50 MG PO TABS
25.0000 mg | ORAL_TABLET | Freq: Three times a day (TID) | ORAL | Status: DC
  Administered 2015-12-04 – 2015-12-05 (×3): 25 mg via ORAL
  Filled 2015-12-04 (×4): qty 1

## 2015-12-04 MED ORDER — LIDOCAINE HCL 2 % EX GEL: CUTANEOUS | Status: DC | PRN

## 2015-12-04 MED ORDER — PHENOL 1.4 % MT LIQD: 1.0000 | OROMUCOSAL | Status: DC | PRN

## 2015-12-04 MED ORDER — CITALOPRAM HYDROBROMIDE 20 MG PO TABS
20.0000 mg | ORAL_TABLET | Freq: Every day | ORAL | Status: DC
  Administered 2015-12-05 – 2015-12-15 (×11): 20 mg via ORAL
  Filled 2015-12-04 (×11): qty 1

## 2015-12-04 MED ORDER — BISACODYL 10 MG RE SUPP: 10.0000 mg | Freq: Every day | RECTAL | Status: DC | PRN

## 2015-12-04 MED ORDER — PROCHLORPERAZINE MALEATE 5 MG PO TABS: 5.0000 mg | ORAL_TABLET | Freq: Four times a day (QID) | ORAL | Status: DC | PRN

## 2015-12-04 MED ORDER — PANTOPRAZOLE SODIUM 40 MG PO TBEC
40.0000 mg | DELAYED_RELEASE_TABLET | Freq: Every day | ORAL | Status: DC
  Administered 2015-12-05 – 2015-12-07 (×3): 40 mg via ORAL
  Filled 2015-12-04 (×3): qty 1

## 2015-12-04 MED ORDER — LINAGLIPTIN 5 MG PO TABS
5.0000 mg | ORAL_TABLET | Freq: Every day | ORAL | Status: DC
  Administered 2015-12-05 – 2015-12-15 (×11): 5 mg via ORAL
  Filled 2015-12-04 (×12): qty 1

## 2015-12-04 MED ORDER — BRIMONIDINE TARTRATE 0.15 % OP SOLN
1.0000 [drp] | Freq: Two times a day (BID) | OPHTHALMIC | Status: DC
  Administered 2015-12-04 – 2015-12-15 (×22): 1 [drp] via OPHTHALMIC
  Filled 2015-12-04 (×2): qty 5

## 2015-12-04 MED ORDER — CALCITRIOL 0.25 MCG PO CAPS
0.2500 ug | ORAL_CAPSULE | ORAL | Status: DC
  Administered 2015-12-06 – 2015-12-15 (×5): 0.25 ug via ORAL
  Filled 2015-12-04 (×5): qty 1

## 2015-12-04 MED ORDER — DOCUSATE SODIUM 100 MG PO CAPS
100.0000 mg | ORAL_CAPSULE | Freq: Two times a day (BID) | ORAL | Status: DC
  Administered 2015-12-04 – 2015-12-11 (×14): 100 mg via ORAL
  Filled 2015-12-04 (×17): qty 1

## 2015-12-04 MED ORDER — POLYETHYLENE GLYCOL 3350 17 G PO PACK
17.0000 g | PACK | Freq: Two times a day (BID) | ORAL | Status: DC
  Administered 2015-12-04 – 2015-12-11 (×13): 17 g via ORAL
  Filled 2015-12-04 (×15): qty 1

## 2015-12-04 MED ORDER — ATORVASTATIN CALCIUM 10 MG PO TABS
10.0000 mg | ORAL_TABLET | Freq: Every day | ORAL | Status: DC
  Administered 2015-12-05 – 2015-12-15 (×11): 10 mg via ORAL
  Filled 2015-12-04 (×11): qty 1

## 2015-12-04 MED ORDER — LIDOCAINE 5 % EX PTCH
1.0000 | MEDICATED_PATCH | CUTANEOUS | Status: DC
  Administered 2015-12-05 – 2015-12-14 (×10): 1 via TRANSDERMAL
  Filled 2015-12-04 (×11): qty 1

## 2015-12-04 NOTE — H&P (View-Only) (Signed)
Physical Medicine and Rehabilitation Admission H&P    CC: HNP lumbar spine with radiculopathy  HPI:  [name] is a 75 y.o. female with history of HTN, CAD s/p CABG with brief PAF, depression, encephalopathy due to AKI, T2DM with neuropathy, right CAS, CKD-stage IV (Dr. 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 She elected to undergo bilateral L5-S1 microdiskectomy by Dr. Lorin Mercy on 12/02/15. Post op has had back pain, inability to recall back precautions with poor safety as well as LLE instability affecting mobility. She has also had problems with urinary retention requiring intermittent caths and ABLA.  BLE dopplers done due to complaints of bilateral calf pain and LE edema. These were negative for DVT. Therapy ongoing and CIR recommended by Rehab team.     Review of Systems  HENT: Positive for hearing loss. Negative for tinnitus.   Eyes: Negative for blurred vision and double vision.  Cardiovascular: Positive for leg swelling (increases lasix prn swelling). Negative for chest pain.  Gastrointestinal: Positive for heartburn, abdominal pain and constipation. Negative for nausea and vomiting.  Genitourinary: Negative for dysuria and urgency.  Musculoskeletal: Positive for myalgias, back pain, joint pain (left hip and left knee) and falls (multiple ).  Neurological: Positive for tingling (numbness and tingling bilateral feet and hands) and sensory change (back and BLE sensitive--"killing her").  Psychiatric/Behavioral: The patient is nervous/anxious.   All other systems reviewed and are negative.    Past Medical History  Diagnosis Date  . Anemia   . Glaucoma   . Hypertension   . Blood transfusion   . GERD (gastroesophageal reflux disease)   . Arthritis   . Neuromuscular disorder ([place])     CARPEL TUNNEL  . Atrial fibrillation ([place])   . CAD (coronary artery disease)   . Carotid artery occlusion     Carotid Endartectom,y - left 2009.  Blockage Right  being watched by Dr Scot Dock.  Marland Kitchen HOH (hard of hearing)   . Shortness of breath   . Depression   . History of kidney stones     passed  . Carotid stenosis   . Complication of anesthesia     pt. states that she was difficult to wake  . Stroke ([place])     hx of TIA  . 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 Cancer ([place])     .  top of head- melonoma  . Dysrhythmia   . History of pneumonia   . Diabetes mellitus without complication ([place])   . Myocardial infarction ([place])   . History of hiatal hernia   . Pneumonia     Past Surgical History  Procedure Laterality Date  . Carpel tunnel    . Neck surgery    . Esophagogastroduodenoscopy  07/25/2011    Procedure: ESOPHAGOGASTRODUODENOSCOPY (EGD);  Surgeon: Winfield Cunas., MD;  Location: [facility] ENDOSCOPY;  Service: Endoscopy;  Laterality: N/A;  . Colonoscopy  07/25/2011    Procedure: COLONOSCOPY;  Surgeon: Winfield Cunas., MD;  Location: [facility] ENDOSCOPY;  Service: Endoscopy;  Laterality: N/A;  . Coronary artery bypass graft  07/29/2011    Procedure: CORONARY ARTERY BYPASS GRAFTING (CABG);  Surgeon: Gaye Pollack, MD;  Location: [place];  Service: Open Heart Surgery;  Laterality: N/A;  . Carotid endarterectomy Left 2009     CEA  . Av fistula placement Left 05/31/2014    Procedure: Creation of Left Arm arteriovenous brachiocephalic Fistula;  Surgeon: Harrell Gave  Nicole Cella, MD;  Location: [place];  Service: Vascular;  Laterality: Left;  . Fistulogram Left 08/29/2014    Procedure: FISTULOGRAM;  Surgeon: Angelia Mould, MD;  Location: [facility] CATH LAB;  Service: Cardiovascular;  Laterality: Left;  . Av fistula placement Left 09/01/2014    Procedure: INSERTION OF ARTERIOVENOUS (AV) GORE-TEX GRAFT LEFT UPPER ARM USING  4-7 MM X 45 CM SRTETCH GORETEX GRAFT;  Surgeon: Angelia Mould, MD;  Location: [place];  Service: Vascular;  Laterality: Left;  . Cardiac catheterization    . Tonsillectomy    . Eye surgery    . Endarterectomy Right  04/21/2015    Procedure: ENDARTERECTOMY CAROTID;  Surgeon: Angelia Mould, MD;  Location: [facility] OR;  Service: Vascular;  Laterality: Right;  . Knee surgery Left     Family History  Problem Relation Age of Onset  . Diabetes Mother   . Hypertension Mother   . Heart disease Mother     beofre age 62  . Heart attack Mother   . Stroke Mother   . Cancer Father   . Hyperlipidemia Father   . Hypertension Father   . Deep vein thrombosis Daughter   . Diabetes Daughter   . Hyperlipidemia Daughter   . Hypertension Daughter   . Heart disease Daughter   . Peripheral vascular disease Daughter     Social History:  Married. 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 independent with cane but sedentary reports that she has never smoked. She has never used smokeless tobacco. She reports that she does not drink alcohol or use illicit drugs.     Allergies  Allergen Reactions  . Gabapentin Other (See Comments)    hallucinations  . Amlodipine     Other reaction(s): edema  . Other     Other reaction(s): Fatigue, Gout, Hallucintions  . Sulfa Antibiotics     Other reaction(s): psychiatric disturbances   Medications Prior to Admission  Medication Sig Dispense Refill  . acetaminophen (TYLENOL) 500 MG tablet Take 500 mg by mouth every 6 (six) hours as needed. For pain    . allopurinol (ZYLOPRIM) 100 MG tablet Take 100 mg by mouth 2 (two) times daily.     Marland Kitchen amLODipine (NORVASC) 10 MG tablet TAKE ONE TABLET BY MOUTH ONE TIME DAILY 30 tablet 9  . aspirin 325 MG tablet Take 325 mg by mouth daily.    Marland Kitchen atorvastatin (LIPITOR) 10 MG tablet TAKE ONE TABLET BY MOUTH ONE TIME DAILY (Patient taking differently: Take 10 mg by mouth daily. ) 30 tablet 0  . B Complex Vitamins (VITAMIN B COMPLEX PO) Take 1 tablet by mouth daily.    . brimonidine (ALPHAGAN P) 0.1 % SOLN Place 1 drop into the left eye 2 (two) times daily.    . calcitRIOL (ROCALTROL) 0.25 MCG capsule Take 0.25 mcg by mouth every  other day.    . Cholecalciferol (VITAMIN D) 2000 UNITS tablet Take 2,000 Units by mouth 2 (two) times daily.    . citalopram (CELEXA) 20 MG tablet Take 20 mg by mouth daily.    . colchicine 0.6 MG tablet Take 0.6 mg by mouth 2 (two) times daily as needed. gout    . Cyanocobalamin (VITAMIN B 12 PO) Take 1,000 mg by mouth.    . fish oil-omega-3 fatty acids 1000 MG capsule Take 2 g by mouth daily.    . furosemide (LASIX) 40 MG tablet Take 80 mg by mouth 2 (two) times daily.     Marland Kitchen  HUMALOG 100 UNIT/ML injection Inject 0-12 Units into the skin 3 (three) times daily with meals. Sliding scale    . insulin glargine (LANTUS) 100 UNIT/ML injection Inject 20-30 Units into the skin at bedtime.     Marland Kitchen linagliptin (TRADJENTA) 5 MG TABS tablet Take 1 tablet (5 mg total) by mouth daily with breakfast. 30 tablet 1  . metoprolol (LOPRESSOR) 50 MG tablet Take 50 mg by mouth 2 (two) times daily.    Marland Kitchen omeprazole (PRILOSEC) 20 MG capsule Take 20 mg by mouth daily as needed. Acid reflux    . oxycodone (OXY-IR) 5 MG capsule Take 5 mg by mouth every 6 (six) hours as needed for pain.    Marland Kitchen timolol (TIMOPTIC) 0.5 % ophthalmic solution Place 1 drop into the left eye daily.    . travoprost, benzalkonium, (TRAVATAN) 0.004 % ophthalmic solution Place 1 drop into the left eye at bedtime.      Home: Home Living Family/patient expects to be discharged to:: Private residence Living Arrangements: Spouse/significant other Available Help at Discharge: Family, Available 24 hours/day (husband and daughter) Type of Home: House Home Access: Level entry Home Layout: Two level, Able to live on main level with bedroom/bathroom (basement is main level (no stairs to enter)) Alternate Level Stairs-Number of Steps: flight Bathroom Shower/Tub: Gaffer, Door [employer] Toilet: Handicapped height Bathroom Accessibility: Yes Home Equipment: Environmental consultant - 4 wheels, Environmental consultant - 2 wheels, Cane - single point, Bedside commode, Grab bars -  toilet Additional Comments: Daughter lives in a house behind pt   Functional History: Prior Function Level of Independence: Needs assistance Gait / Transfers Assistance Needed: frequent falls due to imbalance; only uses cane when goes out; uses no device inside ADL's / Homemaking Assistance Needed: Daughter assists with dressing and IADLs  Functional Status:  Mobility: Bed Mobility Overal bed mobility: Needs Assistance Bed Mobility: Rolling, Sit to Sidelying Rolling: Min assist Sit to sidelying: Mod assist General bed mobility comments: HOB flat, heavy use of bedrails. Assist to move BLE onto bed and to position hips using bed pad. Verbal cues for proper log roll technique throughout however pt with poor return demonstration due to pain Transfers Overall transfer level: Needs assistance Equipment used: Rolling walker (2 wheeled) Transfers: Sit to/from Stand, Stand Pivot Transfers Sit to Stand: Min assist Stand pivot transfers: Min assist General transfer comment: Min assist for boost to stand, balance upon standing and balance during transfers. Verbal cues for safety, safe use of DME, and safe hand placement throughout transfers. Ambulation/Gait Ambulation/Gait assistance: Min assist Ambulation Distance (Feet): 5 Feet (x 2) Assistive device: Rolling walker (2 wheeled) Gait Pattern/deviations: Step-to pattern, Decreased stride length General Gait Details: Pt. with limited tolerance to gait due to increasing pain in low back.  Needs assist for stability/safety Gait velocity: decreased    ADL: ADL Overall ADL's : Needs assistance/impaired Grooming: Wash/dry hands, Sitting, Set up Upper Body Bathing: Moderate assistance, Sitting Lower Body Bathing: Maximal assistance, Sit to/from stand Lower Body Bathing Details (indicate cue type and reason): pt. states she is unable to cross legs over knees at this time, states her spouse will be able to assist at home with LB ADLS Upper Body  Dressing : Moderate assistance, Sitting Lower Body Dressing: Maximal assistance, Sit to/from stand Lower Body Dressing Details (indicate cue type and reason): pt. states she is unable to cross legs over knees at this time, states her spouse will be able to assist at home with LB ADLS Toilet Transfer: Minimal assistance, Ambulation,  BSC, RW, Cueing for safety Toilet Transfer Details (indicate cue type and reason): Heavy min assist for boost to stand and for balance. Pt only able to ambulate 4' and then requested BSC be brought to her due to increased pain. Toileting- Clothing Manipulation and Hygiene: Minimal assistance, Sit to/from stand, Cueing for safety Toileting - Clothing Manipulation Details (indicate cue type and reason): Cues for safe hand placement and balance. Assist for sit-stand and balance during pericare Functional mobility during ADLs: Minimal assistance, Rolling walker, Cueing for safety General ADL Comments: Pt required increased physical assistance this session and husband reports he and his daughter are unable to provide this level of assist at this time. Pt and husband agree that pt would not be safe to d/c home at this time and are agreeable to SNF for continued rehab.  Cognition: Cognition Overall Cognitive Status: Within Functional Limits for tasks assessed Orientation Level: Oriented X4 Cognition Arousal/Alertness: Lethargic, Suspect due to medications Behavior During Therapy: Flat affect Overall Cognitive Status: Within Functional Limits for tasks assessed Memory: Decreased recall of precautions   Physical Exam: Blood pressure 130/70, pulse 77, temperature 99.4 F (37.4 C), temperature source Oral, resp. rate 18, weight 86.183 kg (190 lb), SpO2 95 %. Physical Exam  Nursing note and vitals reviewed. Constitutional: She is oriented to person, place, and time. She appears well-developed and well-nourished. She appears distressed.  Lying on right side moaning and  groaning--reporting excruciating pain in back and legs (but  able to get up and walk 20 feet with PT after exam).   HENT:  Head: Normocephalic and atraumatic.  Mouth/Throat: Oropharynx is clear and moist.  Eyes: Conjunctivae and EOM are normal. Pupils are equal, round, and reactive to light. Right eye exhibits no discharge.  Neck: Normal range of motion. Neck supple.  Cardiovascular: Normal rate and regular rhythm.   Respiratory: Effort normal. No stridor. She has wheezes (expiratory wheezes).  GI: Soft. She exhibits no distension. Bowel sounds are decreased. There is no tenderness.  Musculoskeletal: She exhibits edema (2+ pedal edema and min edema B-tibially) and tenderness.  Positive thrill LUE graft  Neurological: She is alert and oriented to person, place, and time.  Patient is very hard of hearing.  Follows simple commands. DTRs symmetric Motor: B/l UE 4+/5 proximal to distal B/l LE: Hip flexion, knee extension 4-/5, ankle dorsi/plantar flexion 4/5  Skin: Skin is warm and dry.  Back incision clean and dry with dressing in place   Skin: Skin is warm and dry. No rash noted.  Back incision clean and dry  Psychiatric: She has a normal mood and affect. Her behavior is normal.    Results for orders placed or performed during the hospital encounter of 12/01/15 (from the past 48 hour(s))  Glucose, capillary     Status: Abnormal   Collection Time: 12/02/15  6:16 PM  Result Value Ref Range   Glucose-Capillary 193 (H) 65 - 99 mg/dL  Glucose, capillary     Status: Abnormal   Collection Time: 12/02/15  9:29 PM  Result Value Ref Range   Glucose-Capillary 185 (H) 65 - 99 mg/dL  CBC     Status: Abnormal   Collection Time: 12/03/15  5:02 AM  Result Value Ref Range   WBC 11.9 (H) 4.0 - 10.5 K/uL   RBC 2.97 (L) 3.87 - 5.11 MIL/uL   Hemoglobin 9.2 (L) 12.0 - 15.0 g/dL   HCT 28.4 (L) 36.0 - 46.0 %   MCV 95.6 78.0 - 100.0 fL  MCH 31.0 26.0 - 34.0 pg   MCHC 32.4 30.0 - 36.0 g/dL   RDW 15.5  11.5 - 15.5 %   Platelets 130 (L) 150 - 400 K/uL  Basic Metabolic Panel     Status: Abnormal   Collection Time: 12/03/15  5:02 AM  Result Value Ref Range   Sodium 132 (L) 135 - 145 mmol/L   Potassium 3.7 3.5 - 5.1 mmol/L   Chloride 99 (L) 101 - 111 mmol/L   CO2 22 22 - 32 mmol/L   Glucose, Bld 170 (H) 65 - 99 mg/dL   BUN 63 (H) 6 - 20 mg/dL   Creatinine, Ser 3.07 (H) 0.44 - 1.00 mg/dL   Calcium 8.3 (L) 8.9 - 10.3 mg/dL   GFR calc non Af Amer 14 (L) >60 mL/min   GFR calc Af Amer 16 (L) >60 mL/min    Comment: (NOTE) The eGFR has been calculated using the CKD EPI equation. This calculation has not been validated in all clinical situations. eGFR's persistently <60 mL/min signify possible Chronic Kidney Disease.    Anion gap 11 5 - 15  Glucose, capillary     Status: Abnormal   Collection Time: 12/03/15  7:12 AM  Result Value Ref Range   Glucose-Capillary 172 (H) 65 - 99 mg/dL  Glucose, capillary     Status: Abnormal   Collection Time: 12/03/15 11:26 AM  Result Value Ref Range   Glucose-Capillary 210 (H) 65 - 99 mg/dL  Glucose, capillary     Status: Abnormal   Collection Time: 12/03/15  4:24 PM  Result Value Ref Range   Glucose-Capillary 187 (H) 65 - 99 mg/dL   Comment 1 Notify RN   Glucose, capillary     Status: Abnormal   Collection Time: 12/03/15  9:19 PM  Result Value Ref Range   Glucose-Capillary 201 (H) 65 - 99 mg/dL  CBC     Status: Abnormal   Collection Time: 12/04/15  3:16 AM  Result Value Ref Range   WBC 9.1 4.0 - 10.5 K/uL   RBC 2.73 (L) 3.87 - 5.11 MIL/uL   Hemoglobin 8.4 (L) 12.0 - 15.0 g/dL   HCT 25.9 (L) 36.0 - 46.0 %   MCV 94.9 78.0 - 100.0 fL   MCH 30.8 26.0 - 34.0 pg   MCHC 32.4 30.0 - 36.0 g/dL   RDW 15.5 11.5 - 15.5 %   Platelets 133 (L) 150 - 400 K/uL  Basic Metabolic Panel     Status: Abnormal   Collection Time: 12/04/15  3:16 AM  Result Value Ref Range   Sodium 134 (L) 135 - 145 mmol/L   Potassium 3.6 3.5 - 5.1 mmol/L   Chloride 102 101 - 111  mmol/L   CO2 21 (L) 22 - 32 mmol/L   Glucose, Bld 167 (H) 65 - 99 mg/dL   BUN 66 (H) 6 - 20 mg/dL   Creatinine, Ser 3.00 (H) 0.44 - 1.00 mg/dL   Calcium 8.5 (L) 8.9 - 10.3 mg/dL   GFR calc non Af Amer 14 (L) >60 mL/min   GFR calc Af Amer 16 (L) >60 mL/min    Comment: (NOTE) The eGFR has been calculated using the CKD EPI equation. This calculation has not been validated in all clinical situations. eGFR's persistently <60 mL/min signify possible Chronic Kidney Disease.    Anion gap 11 5 - 15  Glucose, capillary     Status: Abnormal   Collection Time: 12/04/15  6:14 AM  Result Value Ref Range  Glucose-Capillary 155 (H) 65 - 99 mg/dL  Glucose, capillary     Status: Abnormal   Collection Time: 12/04/15 12:13 PM  Result Value Ref Range   Glucose-Capillary 157 (H) 65 - 99 mg/dL   Comment 1 Notify RN    No results found.     Medical Problem List and Plan: 1.  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 2.  DVT Prophylaxis/Anticoagulation: Pharmaceutical: Lovenox 3. Pain Management:  Symptoms worse since surgery--will simplify to oxycodone 5 mg prn.  Schedule robaxin to help with muscle spasms and low dose ultram.  K pad additionally for local measures.  4. Mood: Needs a lot of encouragement and ego support as is self limiting. LCSW to follow for evaluation and support.  5. Neuropsych: This patient is capable of making decisions on her own behalf. 6. Skin/Wound Care: Monitor wound daily for healing 7. Fluids/Electrolytes/Nutrition: Monitor I/O. Check lytes in am.   8. T2DM: Will monitor BS ac/hs. Continue lantus at bedtime with trajenta in am. Will use SSI for elevated BS.  9. CAD:  Continue lopressor and lipitor.  10. Wheezing: denies increase in chronic SOB. Will check CXR to rule out CHF. 12. CKD with BLE edema: Continue lasix bid ( may need additional dose to help with diuresis) Peripheral edema likely due to fluid  overload. Will recheck labs in am--Cr up to 3.0. Elevate BLE and ace wrap--likely not tolerate TEDs due to worsening of neuropathy.  13. Abdominal pain: Likely due to constipation--question Ileus question bladder distension. Will add miralax bid for now.  Check KUB.  14. Urinary retention: Check UA UCS to rule out UTI. Monitor PVRs and cath for volumes > 350cc  15. Acute on chronic anemia due to CKD: has had iron and epo? injections in the past.  16. H/o gout: off colchicine  17. Thrombocytopenia: Cont to monitor 18. Hyponatremia: Cont to monitor 19. AFib: Monitor HR with increased activity 20. HTN: Monitor with increased activity   Post Admission Physician Evaluation: 1. Functional deficits secondary  to bilateral HNP of L5-S1 with radiculopathy s/p microdiskectomy on 12/02/15. 2. Patient is admitted to receive collaborative, interdisciplinary care between the physiatrist, rehab nursing staff, and therapy team. 3. Patient's level of medical complexity and substantial therapy needs in context of that medical necessity cannot be provided at a lesser intensity of care such as a SNF. 4. Patient has experienced substantial functional loss from his/her baseline which was documented above under the "Functional History" and "Functional Status" headings.  Judging by the patient's diagnosis, physical exam, and functional history, the patient has potential for functional progress which will result in measurable gains while on inpatient rehab.  These gains will be of substantial and practical use upon discharge  in facilitating mobility and self-care at the household level. 5. Physiatrist will provide 24 hour management of medical needs as well as oversight of the therapy plan/treatment and provide guidance as appropriate regarding the interaction of the two. 6. 24 hour rehab nursing will assist with safety, skin/wound care, disease management, pain management and patient education and help integrate therapy  concepts, techniques,education, etc. 7. PT will assess and treat for/with: Lower extremity strength, range of motion, stamina, balance, functional mobility, safety, adaptive techniques and equipment, woundcare, coping skills, pain control, education.   Goals are: Mod I/Supervision. 8. OT will assess and treat for/with: ADL's, functional mobility, safety, upper extremity strength, adaptive techniques and equipment, wound mgt, ego support, and community reintegration.  Goals are: Mod I/Supervision. Therapy may not proceed with showering this patient. 9. Case Management and Social Worker will assess and treat for psychological issues and discharge planning. 10. Team conference will be held weekly to assess progress toward goals and to determine barriers to discharge. 11. Patient will receive at least 3 hours of therapy per day at least 5 days per week. 12. ELOS: 7-11 days.       13. Prognosis:  excellent and good  Delice Lesch, MD  12/04/2015

## 2015-12-04 NOTE — Evaluation (Signed)
Occupational Therapy Assessment and Plan  Patient Details  Name: [name] MRN: [id number] Date of Birth: February 04, 1941  OT Diagnosis: acute pain, lumbago (low back pain) and muscle weakness (generalized) Rehab Potential: Rehab Potential (ACUTE ONLY): Good ELOS: 10-14 days   Today's Date: 12/05/2015 OT Individual Time: 1100-1200 OT Individual Time Calculation (min): 60 min     Problem List:  Patient Active Problem List   Diagnosis Date Noted  . Lumbar back pain with radiculopathy affecting left lower extremity 12/04/2015  . Bilateral calf pain   . Pain   . Wheezing   . Abnormality of gait   . Radiculopathy of lumbosacral region   . Post-operative pain   . Type 2 diabetes mellitus with peripheral neuropathy (HCC)   . Coronary artery disease involving native coronary artery of native heart without angina pectoris   . CKD (chronic kidney disease)   . AP (abdominal pain)   . Urinary retention   . Anemia of chronic disease   . Acute blood loss anemia   . Hx of gout   . Thrombocytopenia ([place])   . Hyponatremia   . Persistent atrial fibrillation ([place])   . Benign essential HTN   . S/P lumbar discectomy 12/01/2015  . 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 Acute delirium   . Acute encephalopathy   . Chronic kidney disease (CKD), stage V ([place]) 07/13/2015  . 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 Hypertension   . Glaucoma   . GERD (gastroesophageal reflux disease)   . Atrial fibrillation ([place])   . CAD (coronary artery disease)   . Depression   . Carotid stenosis   . Stroke ([place])   . Diabetes mellitus without complication ([place])   . Gastroesophageal reflux disease with esophagitis   . Carotid artery stenosis 04/21/2015  . Coronary arteriosclerosis 06/08/2014  . Essential hypertension 06/08/2014  . Hyperlipidemia 06/08/2014  . End stage renal disease ([place]) 05/18/2014  . Occlusion and stenosis of  carotid artery without mention of cerebral infarction 01/01/2012    Past Medical History:  Past Medical History  Diagnosis Date  . Anemia   . Glaucoma   . Hypertension   . Blood transfusion   . GERD (gastroesophageal reflux disease)   . Arthritis   . Neuromuscular disorder ([place])     CARPEL TUNNEL  . Atrial fibrillation ([place])   . CAD (coronary artery disease)   . Carotid artery occlusion     Carotid Endartectom,y - left 2009.  Blockage Right being watched by Dr Scot Dock.  Marland Kitchen HOH (hard of hearing)   . Shortness of breath   . Depression   . History of kidney stones     passed  . Carotid stenosis   . Complication of anesthesia     pt. states that she was difficult to wake  . Stroke ([place])     hx of TIA  . 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 Cancer ([place])     .  top of head- melonoma  . Dysrhythmia   . History of pneumonia   . Diabetes mellitus without complication ([place])   . Myocardial infarction ([place])   . History of hiatal hernia   . Pneumonia    Past Surgical History:  Past Surgical History  Procedure Laterality Date  . Carpel tunnel    . Neck surgery    . Esophagogastroduodenoscopy  07/25/2011    Procedure: ESOPHAGOGASTRODUODENOSCOPY (  EGD);  Surgeon: Winfield Cunas., MD;  Location: [facility] ENDOSCOPY;  Service: Endoscopy;  Laterality: N/A;  . Colonoscopy  07/25/2011    Procedure: COLONOSCOPY;  Surgeon: Winfield Cunas., MD;  Location: [facility] ENDOSCOPY;  Service: Endoscopy;  Laterality: N/A;  . Coronary artery bypass graft  07/29/2011    Procedure: CORONARY ARTERY BYPASS GRAFTING (CABG);  Surgeon: Gaye Pollack, MD;  Location: [place];  Service: Open Heart Surgery;  Laterality: N/A;  . Carotid endarterectomy Left 2009     CEA  . Av fistula placement Left 05/31/2014    Procedure: Creation of Left Arm arteriovenous brachiocephalic Fistula;  Surgeon: Angelia Mould, MD;  Location: [place];  Service: Vascular;  Laterality: Left;  . Fistulogram Left 08/29/2014     Procedure: FISTULOGRAM;  Surgeon: Angelia Mould, MD;  Location: [facility] C[facility] LAB;  Service: Cardiovascular;  Laterality: Left;  . Av fistula placement Left 09/01/2014    Procedure: INSERTION OF ARTERIOVENOUS (AV) GORE-TEX GRAFT LEFT UPPER ARM USING  4-7 MM X 45 CM SRTETCH GORETEX GRAFT;  Surgeon: Angelia Mould, MD;  Location: [place];  Service: Vascular;  Laterality: Left;  . Cardiac catheterization    . Tonsillectomy    . Eye surgery    . Endarterectomy Right 04/21/2015    Procedure: ENDARTERECTOMY CAROTID;  Surgeon: Angelia Mould, MD;  Location: [place];  Service: Vascular;  Laterality: Right;  . Knee surgery Left   . Lumbar laminectomy/decompression microdiscectomy N/A 12/01/2015    Procedure: L5-S1 Decompression and Bilateral Microdiscectomy;  Surgeon: Marybelle Killings, MD;  Location: [place];  Service: Orthopedics;  Laterality: N/A;    Assessment & Plan Clinical Impression: Patient is a 75 y.o. female with history of HTN, CAD s/p CABG with brief PAF, depression, encephalopathy due to AKI, T2DM with neuropathy, right CAS, CKD-stage IV (Dr. 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 She elected to undergo bilateral L5-S1 microdiskectomy by Dr. Lorin Mercy on 12/02/15. Post op has had back pain, inability to recall back precautions with poor safety as well as LLE instability affecting mobility. She has also had problems with urinary retention requiring intermittent caths and ABLA. BLE dopplers done due to complaints of bilateral calf pain and LE edema. These were negative for DVT.    Patient transferred to CIR on 12/04/2015.    Patient currently requires moderate assistance with basic self-care skills secondary to muscle joint tightness.  Prior to hospitalization, patient could complete BADL independently.  Patient will benefit from skilled intervention to increase independence with basic self-care skills prior to discharge home with care partner.  Anticipate  patient will require intermittent supervision and follow up home health.  OT - End of Session Activity Tolerance: Tolerates 30+ min activity with multiple rests Endurance Deficit: Yes OT Assessment Rehab Potential (ACUTE ONLY): Good OT Patient demonstrates impairments in the following area(s): Pain;Balance;Endurance OT Basic ADL's Functional Problem(s): Grooming;Bathing;Dressing;Toileting OT Advanced ADL's Functional Problem(s): Light Housekeeping (To reinforce back precautions) OT Transfers Functional Problem(s): Toilet;Tub/Shower OT Additional Impairment(s): None OT Plan OT Intensity: Minimum of 1-2 x/day, 45 to 90 minutes OT Frequency: 5 out of 7 days OT Duration/Estimated Length of Stay: 10-12 days OT Treatment/Interventions: Therapeutic Exercise;Therapeutic Activities;Self Care/advanced ADL retraining;Patient/family education;DME/adaptive equipment instruction;Discharge planning;Pain management;Functional mobility training OT Self Feeding Anticipated Outcome(s): Independent OT Basic Self-Care Anticipated Outcome(s): Supervision OT Toileting Anticipated Outcome(s): Mod I OT Bathroom Transfers Anticipated Outcome(s): Supervision OT Recommendation Patient destination: Home Follow Up Recommendations: Home health OT Equipment Recommended:  To be determined   Skilled Therapeutic Intervention OT initial evaluation completed with treatment provided to focus on reinforcement training on back precautions, skilled use of AE (LH sponge and sock aid provided), toileting, transfers, adapted dressing skills, family education (husband present).   Pt reporting severe pain at lower back and unable to participate in bathing but proceeded through transfrer to [facility], toileting, and dressing skills.   Pt overall moderate assist for BADL.  Husband reports he uses a Secondary school teacher and sock aid and will bring reacher in to allow pt device for practice.   Pt left seated in w/c with call light within reach and husband  asleep in recliner.   OT Evaluation Precautions/Restrictions  Precautions Precautions: Back;Fall Precaution Booklet Issued: No Precaution Comments: 2 of 3 BAT recalled during OT eval Restrictions Weight Bearing Restrictions: No  General Chart Reviewed: Yes Family/Caregiver Present: Yes  Vital Signs Therapy Vitals BP: (!) 128/42 mmHg Patient Position (if appropriate): Sitting  Pain Pain Assessment Pain Assessment: 0-10 Pain Score: 6  Pain Type: Acute pain Pain Location: Buttocks Pain Orientation: Lower Pain Descriptors / Indicators: Sharp;Constant Pain Onset: On-going Pain Intervention(s): Medication (See eMAR);Distraction  Home Living/Prior Functioning Home Living Available Help at Discharge: Family, Available 24 hours/day Type of Home: House Home Access: Level entry (Living in basement (level), modified to allow) Home Layout: Two level, Able to live on main level with bedroom/bathroom Alternate Level Stairs-Number of Steps: 10 steps to upper level Alternate Level Stairs-Rails: Right Bathroom Shower/Tub: Gaffer, Door [employer] Toilet: Handicapped height Bathroom Accessibility: Yes Additional Comments: Daughter lives in a house behind pt  Lives With: Spouse IADL History Homemaking Responsibilities: Yes Meal Prep Responsibility: Secondary (eat out for the last 2 years) Laundry Responsibility: No Cleaning Responsibility: Secondary Bill Paying/Finance Responsibility: Primary Shopping Responsibility: No Child Care Responsibility: No Current License: No Education: HS Occupation: Retired 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 Prior Function Level of Independence: Independent with basic ADLs, Needs assistance with gait  Able to [place]?: Yes Driving: No Vocation: Retired  ADL ADL ADL Comments: see Functional Assessment Tool   Vision/Perception  Vision- History Baseline Vision/History: Wears  glasses Wears Glasses: At all times Patient Visual Report: No change from baseline (catarct surgeries, lens) Vision- Assessment Vision Assessment?: No apparent visual deficits   Cognition Arousal/Alertness: Awake/alert Orientation Level: Person;Situation Year: 2017 Month: May Day of Week: Correct Memory: Appears intact Immediate Memory Recall: Sock;Blue;Bed Memory Recall: Sock;Blue;Bed Memory Recall Sock: With Cue Memory Recall Blue: Without Cue Memory Recall Bed: Without Cue Attention: Alternating Awareness: Appears intact Problem Solving: Appears intact Safety/Judgment: Appears intact  Sensation Sensation Light Touch: Impaired by gross assessment (bilateral neuropathies (hands)) Stereognosis: Appears Intact Hot/Cold: Appears Intact Proprioception: Appears Intact Coordination Gross Motor Movements are Fluid and Coordinated: Yes Fine Motor Movements are Fluid and Coordinated: Yes  Motor  Motor Motor: Within Functional Limits  Mobility  Transfers Transfers: Sit to Stand;Stand to Sit Sit to Stand: 3: Mod assist Sit to Stand Details: Manual facilitation for weight shifting;Verbal cues for precautions/safety;Verbal cues for safe use of DME/AE Stand to Sit: 4: Min guard   Trunk/Postural Assessment  Cervical Assessment Cervical Assessment: Within Functional Limits Thoracic Assessment Thoracic Assessment: Within Functional Limits Lumbar Assessment Lumbar Assessment: Exceptions to [facility] Lumbar AROM Overall Lumbar AROM: Due to precautions Postural Control Postural Control: Within Functional Limits   Balance Balance Balance Assessed: No   Extremity/Trunk Assessment RUE Assessment RUE Assessment: Within Functional Limits LUE Assessment  LUE Assessment: Within Functional Limits   See Function Navigator for Current Functional Status.   Refer to Care Plan for Long Term Goals  Recommendations for other services: None  Discharge Criteria: Patient will be  discharged from OT if patient refuses treatment 3 consecutive times without medical reason, if treatment goals not met, if there is a change in medical status, if patient makes no progress towards goals or if patient is discharged from hospital.  The above assessment, treatment plan, treatment alternatives and goals were discussed and mutually agreed upon: by patient and by family  [place] 12/05/2015, 2:09 PM

## 2015-12-04 NOTE — Progress Notes (Signed)
Physical Therapy Treatment Patient Details Name: [name] MRN: [id number] DOB: 06-09-1941 Today's Date: 12/04/2015    History of Present Illness Pt. admitted with LBP and L LE radiculopathy, underwent L5-S1 decompression and bilateral microdiscectomies.  Pt. with h/o glaucoma, CAD, afib, h/o TIA, CKD, DM, MI.  Pt. 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      PT Comments    Pt performed increased activity but remains to require max cues for motivation to advance gait distance.    Follow Up Recommendations  CIR     Equipment Recommendations  None recommended by PT    Recommendations for Other Services       Precautions / Restrictions Precautions Precautions: Back;Fall Precaution Booklet Issued: No Precaution Comments: Pt unable to recall any back precautions due to being preoccupied with significant pain. Restrictions Weight Bearing Restrictions: No    Mobility  Bed Mobility Overal bed mobility: Needs Assistance Bed Mobility: Rolling;Sit to Sidelying Rolling: Min assist       Sit to sidelying: Mod assist General bed mobility comments: HOB flat, heavy use of bedrails. Assist to move BLE onto bed and to position hips using bed pad. Verbal cues for proper log roll technique throughout however pt with poor return demonstration due to pain  Transfers Overall transfer level: Needs assistance Equipment used: Rolling walker (2 wheeled) Transfers: Sit to/from Stand Sit to Stand: Min assist         General transfer comment: Min assist for boost to stand, balance upon standing and balance during transfers. Verbal cues for safety, safe use of DME, and safe hand placement throughout transfers.  Ambulation/Gait Ambulation/Gait assistance: Min assist Ambulation Distance (Feet): 20 Feet Assistive device: Rolling walker (2 wheeled) Gait Pattern/deviations: Step-to pattern;Decreased stride length;Trunk flexed Gait velocity:  decreased   General Gait Details: Pt with limited tolerance to gait due to pain but able to progress further this intervention.     Stairs            Wheelchair Mobility    Modified Rankin (Stroke Patients Only)       Balance                                    Cognition Arousal/Alertness: Lethargic;Suspect due to medications Behavior During Therapy: Flat affect Overall Cognitive Status: Within Functional Limits for tasks assessed       Memory: Decreased recall of precautions              Exercises Total Joint Exercises Knee Flexion: AAROM;Left;10 reps (Pt performed in side lying with slight stretch.  )    General Comments        Pertinent Vitals/Pain Pain Assessment: Faces Faces Pain Scale: Hurts whole lot Pain Location: back Pain Descriptors / Indicators: Guarding;Grimacing;Operative site guarding;Sore;Throbbing;Radiating Pain Intervention(s): Monitored during session;Repositioned;Ice applied    Home Living                      Prior Function            PT Goals (current goals can now be found in the care plan section) Acute Rehab PT Goals Patient Stated Goal: none stated Potential to Achieve Goals: Good Progress towards PT goals: Progressing toward goals    Frequency  Min 6X/week    PT Plan Current plan remains appropriate    Co-evaluation  End of Session Equipment Utilized During Treatment: Gait belt Activity Tolerance: Patient limited by pain Patient left: in chair;with call bell/phone within reach     Time: 1437-1507 PT Time Calculation (min) (ACUTE ONLY): 30 min  Charges:  $Gait Training: 8-22 mins $Therapeutic Activity: 8-22 mins                    G Codes:      Cristela Blue 12/07/15, 4:46 PM  Governor Rooks, PTA pager [phone]

## 2015-12-04 NOTE — Progress Notes (Signed)
*  PRELIMINARY RESULTS* Vascular Ultrasound Lower extremity venous duplex has been completed.  Preliminary findings: Technically difficult due to poor patient positioning. No obvious evidence of DVT or baker's cyst.   Landry Mellow, RDMS, RVT  12/04/2015, 2:05 PM

## 2015-12-04 NOTE — Progress Notes (Signed)
Rehab admissions - I met with patient, her husband and her granddaughter at the bedside.  They are in agreement to inpatient rehab admission.  Bed available and will admit to acute inpatient rehab today.  Call me for questions.  #[phone]

## 2015-12-04 NOTE — Consult Note (Signed)
Physical Medicine and Rehabilitation Consult Reason for Consult: Bilateral herniation of L5-S1 with radiculopathy Referring Physician: Dr. Lorin Mercy   HPI: [name] is a 75 y.o. right handed female with history of glaucoma, hypertension, atrial fibrillation maintained on aspirin 325 mg daily, CAD status post CABG, diabetes mellitus and peripheral neuropathy, chronic renal insufficiency with baseline creatinine 2.74-3.07 and TIA. Per chart review patient lives with spouse. Independent with a  cane prior to admission with noted frequent falls. Two-level home with bedroom on first floor. Presented 12/01/2015 with persistent low back pain radiating to left lower extremity. MRI imaging showed L5-S1 circumferential disc bulging, disc space height loss and moderate facet arthrosis with radiculopathy, moderate bilateral neuroforaminal stenosis. Also L4-S5 mild disc bulge and mild facet hypertrophy. Patient has failed conservative treatment. Underwent bilateral L5-S1 microdiscectomy 12/01/2015 per Dr. Lorin Mercy. Hospital course pain management. Acute blood loss anemia 8.4 and monitored. Creatinine currently 3.00. Physical therapy evaluation completed and ongoing with recommendations of physical medicine rehabilitation consult.   Review of Systems  Constitutional: Negative for fever and chills.  HENT: Positive for hearing loss.   Eyes: Negative for blurred vision and double vision.  Respiratory: Negative for cough.        Shortness of breath on exertion  Cardiovascular: Positive for leg swelling. Negative for chest pain.  Gastrointestinal: Positive for constipation. Negative for nausea and vomiting.       GERD  Musculoskeletal: Positive for myalgias, back pain and falls.  Skin: Negative for rash.  Neurological: Positive for weakness. Negative for seizures and headaches.  Psychiatric/Behavioral: Positive for depression.  All other systems reviewed and are negative.  Past Medical History    Diagnosis Date  . Anemia   . Glaucoma   . Hypertension   . Blood transfusion   . GERD (gastroesophageal reflux disease)   . Arthritis   . Neuromuscular disorder ([place])     CARPEL TUNNEL  . Atrial fibrillation ([place])   . CAD (coronary artery disease)   . Carotid artery occlusion     Carotid Endartectom,y - left 2009.  Blockage Right being watched by Dr Scot Dock.  Marland Kitchen HOH (hard of hearing)   . Shortness of breath   . Depression   . History of kidney stones     passed  . Carotid stenosis   . Complication of anesthesia     pt. states that she was difficult to wake  . Stroke ([place])     hx of TIA  . 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 Cancer ([place])     .  top of head- melonoma  . Dysrhythmia   . History of pneumonia   . Diabetes mellitus without complication ([place])   . Myocardial infarction ([place])   . History of hiatal hernia   . Pneumonia    Past Surgical History  Procedure Laterality Date  . Carpel tunnel    . Neck surgery    . Esophagogastroduodenoscopy  07/25/2011    Procedure: ESOPHAGOGASTRODUODENOSCOPY (EGD);  Surgeon: Winfield Cunas., MD;  Location: [facility] ENDOSCOPY;  Service: Endoscopy;  Laterality: N/A;  . Colonoscopy  07/25/2011    Procedure: COLONOSCOPY;  Surgeon: Winfield Cunas., MD;  Location: [facility] ENDOSCOPY;  Service: Endoscopy;  Laterality: N/A;  . Coronary artery bypass graft  07/29/2011    Procedure: CORONARY ARTERY BYPASS GRAFTING (CABG);  Surgeon: Gaye Pollack, MD;  Location: [place];  Service: Open Heart Surgery;  Laterality: N/A;  .  Carotid endarterectomy Left 2009     CEA  . Av fistula placement Left 05/31/2014    Procedure: Creation of Left Arm arteriovenous brachiocephalic Fistula;  Surgeon: Angelia Mould, MD;  Location: [place];  Service: Vascular;  Laterality: Left;  . Fistulogram Left 08/29/2014    Procedure: FISTULOGRAM;  Surgeon: Angelia Mould, MD;  Location[facility] CATH LAB;  Service: Cardiovascular;  Laterality: Left;  . Av  fistula placement Left 09/01/2014    Procedure: INSERTION OF ARTERIOVENOUS (AV) GORE-TEX GRAFT LEFT UPPER ARM USING  4-7 MM X 45 CM SRTETCH GORETEX GRAFT;  Surgeon: Angelia Mould, MD;  Location: [place];  Service: Vascular;  Laterality: Left;  . Cardiac catheterization    . Tonsillectomy    . Eye surgery    . Endarterectomy Right 04/21/2015    Procedure: ENDARTERECTOMY CAROTID;  Surgeon: Angelia Mould, MD;  Location: [facility] OR;  Service: Vascular;  Laterality: Right;  . Knee surgery Left    Family History  Problem Relation Age of Onset  . Diabetes Mother   . Hypertension Mother   . Heart disease Mother     beofre age 25  . Heart attack Mother   . Stroke Mother   . Cancer Father   . Hyperlipidemia Father   . Hypertension Father   . Deep vein thrombosis Daughter   . Diabetes Daughter   . Hyperlipidemia Daughter   . Hypertension Daughter   . Heart disease Daughter   . Peripheral vascular disease Daughter    Social History:  reports that she has never smoked. She has never used smokeless tobacco. She reports that she does not drink alcohol or use illicit drugs. Allergies:  Allergies  Allergen Reactions  . Gabapentin Other (See Comments)    hallucinations  . Amlodipine     Other reaction(s): edema  . Other     Other reaction(s): Fatigue, Gout, Hallucintions  . Sulfa Antibiotics     Other reaction(s): psychiatric disturbances   Medications Prior to Admission  Medication Sig Dispense Refill  . acetaminophen (TYLENOL) 500 MG tablet Take 500 mg by mouth every 6 (six) hours as needed. For pain    . allopurinol (ZYLOPRIM) 100 MG tablet Take 100 mg by mouth 2 (two) times daily.     Marland Kitchen amLODipine (NORVASC) 10 MG tablet TAKE ONE TABLET BY MOUTH ONE TIME DAILY 30 tablet 9  . aspirin 325 MG tablet Take 325 mg by mouth daily.    Marland Kitchen atorvastatin (LIPITOR) 10 MG tablet TAKE ONE TABLET BY MOUTH ONE TIME DAILY (Patient taking differently: Take 10 mg by mouth daily. ) 30 tablet 0  . B  Complex Vitamins (VITAMIN B COMPLEX PO) Take 1 tablet by mouth daily.    . brimonidine (ALPHAGAN P) 0.1 % SOLN Place 1 drop into the left eye 2 (two) times daily.    . calcitRIOL (ROCALTROL) 0.25 MCG capsule Take 0.25 mcg by mouth every other day.    . Cholecalciferol (VITAMIN D) 2000 UNITS tablet Take 2,000 Units by mouth 2 (two) times daily.    . citalopram (CELEXA) 20 MG tablet Take 20 mg by mouth daily.    . colchicine 0.6 MG tablet Take 0.6 mg by mouth 2 (two) times daily as needed. gout    . Cyanocobalamin (VITAMIN B 12 PO) Take 1,000 mg by mouth.    . fish oil-omega-3 fatty acids 1000 MG capsule Take 2 g by mouth daily.    . furosemide (LASIX) 40 MG tablet Take  80 mg by mouth 2 (two) times daily.     Marland Kitchen HUMALOG 100 UNIT/ML injection Inject 0-12 Units into the skin 3 (three) times daily with meals. Sliding scale    . insulin glargine (LANTUS) 100 UNIT/ML injection Inject 20-30 Units into the skin at bedtime.     Marland Kitchen linagliptin (TRADJENTA) 5 MG TABS tablet Take 1 tablet (5 mg total) by mouth daily with breakfast. 30 tablet 1  . metoprolol (LOPRESSOR) 50 MG tablet Take 50 mg by mouth 2 (two) times daily.    Marland Kitchen omeprazole (PRILOSEC) 20 MG capsule Take 20 mg by mouth daily as needed. Acid reflux    . oxycodone (OXY-IR) 5 MG capsule Take 5 mg by mouth every 6 (six) hours as needed for pain.    Marland Kitchen timolol (TIMOPTIC) 0.5 % ophthalmic solution Place 1 drop into the left eye daily.    . travoprost, benzalkonium, (TRAVATAN) 0.004 % ophthalmic solution Place 1 drop into the left eye at bedtime.      Home: Home Living Family/patient expects to be discharged to:: Private residence Living Arrangements: Spouse/significant other Available Help at Discharge: Family, Available 24 hours/day (husband and daughter) Type of Home: House Home Access: Level entry Home Layout: Two level, Able to live on main level with bedroom/bathroom (basement is main level (no stairs to enter)) Alternate Level Stairs-Number of  Steps: flight Bathroom Shower/Tub: Gaffer, Door [employer] Toilet: Handicapped height Bathroom Accessibility: Yes Home Equipment: Environmental consultant - 4 wheels, Environmental consultant - 2 wheels, [place] - single point, Bedside commode, Grab bars - toilet Additional Comments: Daughter lives in a house behind pt  Functional History: Prior Function Level of Independence: Needs assistance Gait / Transfers Assistance Needed: frequent falls due to imbalance; only uses cane when goes out; uses no device inside ADL's / Homemaking Assistance Needed: Daughter assists with dressing and IADLs Functional Status:  Mobility: Bed Mobility Overal bed mobility: Needs Assistance Bed Mobility: Rolling, Sidelying to Sit General bed mobility comments: sitting in recliner upon arrival Transfers Overall transfer level: Needs assistance Equipment used: Rolling walker (2 wheeled) Transfers: Sit to/from Stand Sit to Stand: Min guard Stand pivot transfers: Min guard General transfer comment: min guard for safety Ambulation/Gait Ambulation/Gait assistance: Min assist Ambulation Distance (Feet): 5 Feet (x 2) Assistive device: Rolling walker (2 wheeled) Gait Pattern/deviations: Step-to pattern, Decreased stride length General Gait Details: Pt. with limited tolerance to gait due to increasing pain in low back.  Needs assist for stability/safety Gait velocity: decreased    ADL: ADL Overall ADL's : Needs assistance/impaired Grooming: Wash/dry hands, Sitting, Set up Upper Body Bathing: Minimal assitance, Sitting Lower Body Bathing: Maximal assistance, Sit to/from stand Lower Body Bathing Details (indicate cue type and reason): pt. states she is unable to cross legs over knees at this time, states her spouse will be able to assist at home with LB ADLS Upper Body Dressing : Minimal assistance, Sitting Lower Body Dressing: Maximal assistance, Sit to/from stand Lower Body Dressing Details (indicate cue type and reason): pt. states she  is unable to cross legs over knees at this time, states her spouse will be able to assist at home with LB ADLS Toilet Transfer: Min guard, RW, Ambulation Toilet Transfer Details (indicate cue type and reason): simulated with in room transfer from eob, then ambulation to recliner (declined b.room stating she had just finished using the b.room) Toileting- Clothing Manipulation and Hygiene: Minimal assistance, Sit to/from stand Toileting - Clothing Manipulation Details (indicate cue type and reason): simulated during transfer Functional  mobility during ADLs: Min guard, Cueing for sequencing, Rolling walker General ADL Comments: reviewed back precautions, a little more difficult this session as pt. reports her family took her hearing aides home with them last night so therapist asst. had to speak very loud and provide demonstration cues for instructions and back precautions  Cognition: Cognition Overall Cognitive Status: Within Functional Limits for tasks assessed Orientation Level: Oriented X4 Cognition Arousal/Alertness: Awake/alert Behavior During Therapy: WFL for tasks assessed/performed Overall Cognitive Status: Within Functional Limits for tasks assessed  Blood pressure 130/70, pulse 77, temperature 99.4 F (37.4 C), temperature source Oral, resp. rate 18, weight 86.183 kg (190 lb), SpO2 95 %. Physical Exam  Vitals reviewed. Constitutional: She is oriented to person, place, and time. She appears well-developed and well-nourished.  HENT:  Head: Normocephalic and atraumatic.  Eyes: Conjunctivae and EOM are normal.  Neck: Normal range of motion. Neck supple. No thyromegaly present.  Cardiovascular: Normal rate and regular rhythm.   Respiratory: Effort normal and breath sounds normal.  GI: Soft. Bowel sounds are normal. She exhibits no distension.  Musculoskeletal: She exhibits edema. She exhibits no tenderness.  Neurological: She is alert and oriented to person, place, and time.    Patient is very hard of hearing.  Follows simple commands. DTRs symmetric Motor: B/l UE 4+/5 proximal to distal B/l LE: Hip flexion, knee extension 4-/5, ankle dorsi/plantar flexion 4/5  Skin: Skin is warm and dry.  Back incision clean and dry with dressing in place  Psychiatric: She has a normal mood and affect. Her behavior is normal.    Results for orders placed or performed during the hospital encounter of 12/01/15 (from the past 24 hour(s))  Glucose, capillary     Status: Abnormal   Collection Time: 12/03/15  7:12 AM  Result Value Ref Range   Glucose-Capillary 172 (H) 65 - 99 mg/dL  Glucose, capillary     Status: Abnormal   Collection Time: 12/03/15 11:26 AM  Result Value Ref Range   Glucose-Capillary 210 (H) 65 - 99 mg/dL  Glucose, capillary     Status: Abnormal   Collection Time: 12/03/15  4:24 PM  Result Value Ref Range   Glucose-Capillary 187 (H) 65 - 99 mg/dL   Comment 1 Notify RN   Glucose, capillary     Status: Abnormal   Collection Time: 12/03/15  9:19 PM  Result Value Ref Range   Glucose-Capillary 201 (H) 65 - 99 mg/dL  CBC     Status: Abnormal   Collection Time: 12/04/15  3:16 AM  Result Value Ref Range   WBC 9.1 4.0 - 10.5 K/uL   RBC 2.73 (L) 3.87 - 5.11 MIL/uL   Hemoglobin 8.4 (L) 12.0 - 15.0 g/dL   HCT 25.9 (L) 36.0 - 46.0 %   MCV 94.9 78.0 - 100.0 fL   MCH 30.8 26.0 - 34.0 pg   MCHC 32.4 30.0 - 36.0 g/dL   RDW 15.5 11.5 - 15.5 %   Platelets 133 (L) 150 - 400 K/uL  Basic Metabolic Panel     Status: Abnormal   Collection Time: 12/04/15  3:16 AM  Result Value Ref Range   Sodium 134 (L) 135 - 145 mmol/L   Potassium 3.6 3.5 - 5.1 mmol/L   Chloride 102 101 - 111 mmol/L   CO2 21 (L) 22 - 32 mmol/L   Glucose, Bld 167 (H) 65 - 99 mg/dL   BUN 66 (H) 6 - 20 mg/dL   Creatinine, Ser 3.00 (H) 0.44 - 1.00  mg/dL   Calcium 8.5 (L) 8.9 - 10.3 mg/dL   GFR calc non Af Amer 14 (L) >60 mL/min   GFR calc Af Amer 16 (L) >60 mL/min   Anion gap 11 5 - 15  Glucose,  capillary     Status: Abnormal   Collection Time: 12/04/15  6:14 AM  Result Value Ref Range   Glucose-Capillary 155 (H) 65 - 99 mg/dL   No results found.  Assessment/Plan: Diagnosis: Bilateral herniation of L5-S1 with radiculopathy Labs and images independently reviewed.  Records reviewed and summated above.  1. Does the need for close, 24 hr/day medical supervision in concert with the patient's rehab needs make it unreasonable for this patient to be served in a less intensive setting? Yes 2. Co-Morbidities requiring supervision/potential complications: glaucoma, HTN (monitor and provide prns in accordance with increased physical exertion and pain), atrial fibrillation (cont meds, monitor with increased activity), CAD status post CABG (Monitor in accordance with increased physical activity and avoid UE resistance excercises), diabetes mellitus and peripheral neuropathy (Monitor in accordance with exercise and adjust meds as necessary), chronic renal insufficiency (avoid nephrotoxic meds), TIA (cont meds), pain management (Biofeedback training with therapies to help reduce reliance on opiate pain medications, monitor pain control during therapies, and sedation at rest and titrate to maximum efficacy to ensure participation and gains in therapies), Acute blood loss anemia, hyponatremia (cont to monitor, treat if necessary), Thrombocytopenia (< 60,000/mm3 no resistive exercise) 3. Due to safety, skin/wound care, disease management, pain management and patient education, does the patient require 24 hr/day rehab nursing? Yes 4. Does the patient require coordinated care of a physician, rehab nurse, PT (1-2 hrs/day, 5 days/week) and OT (1-2 hrs/day, 5 days/week) to address physical and functional deficits in the context of the above medical diagnosis(es)? Yes Addressing deficits in the following areas: balance, endurance, locomotion, strength, transferring, dressing, toileting and psychosocial  support 5. Can the patient actively participate in an intensive therapy program of at least 3 hrs of therapy per day at least 5 days per week? Potentially 6. The potential for patient to make measurable gains while on inpatient rehab is excellent 7. Anticipated functional outcomes upon discharge from inpatient rehab are modified independent and supervision  with PT, modified independent and supervision with OT, n/a with SLP. 8. Estimated rehab length of stay to reach the above functional goals is: 7-11 days. 9. Does the patient have adequate social supports and living environment to accommodate these discharge functional goals? Yes 10. Anticipated D/C setting: Home 11. Anticipated post D/C treatments: HH therapy and Home excercise program 12. Overall Rehab/Functional Prognosis: excellent  RECOMMENDATIONS: This patient's condition is appropriate for continued rehabilitative care in the following setting: CIR Patient has agreed to participate in recommended program. Yes Note that insurance prior authorization may be required for reimbursement for recommended care.  Comment: Rehab Admissions Coordinator to follow up.  Delice Lesch, MD 12/04/2015

## 2015-12-04 NOTE — H&P (Signed)
Physical Medicine and Rehabilitation Admission H&P    CC: HNP lumbar spine with radiculopathy  HPI:  [name] is a 75 y.o. female with history of HTN, CAD s/p CABG with brief PAF, depression, encephalopathy due to AKI, T2DM with neuropathy, right CAS, CKD-stage IV (Dr. 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 She elected to undergo bilateral L5-S1 microdiskectomy by Dr. Lorin Mercy on 12/02/15. Post op has had back pain, inability to recall back precautions with poor safety as well as LLE instability affecting mobility. She has also had problems with urinary retention requiring intermittent caths and ABLA.  BLE dopplers done due to complaints of bilateral calf pain and LE edema. These were negative for DVT. Therapy ongoing and CIR recommended by Rehab team.     Review of Systems  HENT: Positive for hearing loss. Negative for tinnitus.   Eyes: Negative for blurred vision and double vision.  Cardiovascular: Positive for leg swelling (increases lasix prn swelling). Negative for chest pain.  Gastrointestinal: Positive for heartburn, abdominal pain and constipation. Negative for nausea and vomiting.  Genitourinary: Negative for dysuria and urgency.  Musculoskeletal: Positive for myalgias, back pain, joint pain (left hip and left knee) and falls (multiple ).  Neurological: Positive for tingling (numbness and tingling bilateral feet and hands) and sensory change (back and BLE sensitive--"killing her").  Psychiatric/Behavioral: The patient is nervous/anxious.   All other systems reviewed and are negative.    Past Medical History  Diagnosis Date  . Anemia   . Glaucoma   . Hypertension   . Blood transfusion   . GERD (gastroesophageal reflux disease)   . Arthritis   . Neuromuscular disorder ([place])     CARPEL TUNNEL  . Atrial fibrillation ([place])   . CAD (coronary artery disease)   . Carotid artery occlusion     Carotid Endartectom,y - left 2009.  Blockage Right  being watched by Dr Scot Dock.  Marland Kitchen HOH (hard of hearing)   . Shortness of breath   . Depression   . History of kidney stones     passed  . Carotid stenosis   . Complication of anesthesia     pt. states that she was difficult to wake  . Stroke ([place])     hx of TIA  . 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 Cancer ([place])     .  top of head- melonoma  . Dysrhythmia   . History of pneumonia   . Diabetes mellitus without complication ([place])   . Myocardial infarction ([place])   . History of hiatal hernia   . Pneumonia     Past Surgical History  Procedure Laterality Date  . Carpel tunnel    . Neck surgery    . Esophagogastroduodenoscopy  07/25/2011    Procedure: ESOPHAGOGASTRODUODENOSCOPY (EGD);  Surgeon: Winfield Cunas., MD;  Location: [facility] ENDOSCOPY;  Service: Endoscopy;  Laterality: N/A;  . Colonoscopy  07/25/2011    Procedure: COLONOSCOPY;  Surgeon: Winfield Cunas., MD;  Location: [facility] ENDOSCOPY;  Service: Endoscopy;  Laterality: N/A;  . Coronary artery bypass graft  07/29/2011    Procedure: CORONARY ARTERY BYPASS GRAFTING (CABG);  Surgeon: Gaye Pollack, MD;  Location: [place];  Service: Open Heart Surgery;  Laterality: N/A;  . Carotid endarterectomy Left 2009     CEA  . Av fistula placement Left 05/31/2014    Procedure: Creation of Left Arm arteriovenous brachiocephalic Fistula;  Surgeon: Harrell Gave  Nicole Cella, MD;  Location: [place];  Service: Vascular;  Laterality: Left;  . Fistulogram Left 08/29/2014    Procedure: FISTULOGRAM;  Surgeon: Angelia Mould, MD;  Location: [facility] CATH LAB;  Service: Cardiovascular;  Laterality: Left;  . Av fistula placement Left 09/01/2014    Procedure: INSERTION OF ARTERIOVENOUS (AV) GORE-TEX GRAFT LEFT UPPER ARM USING  4-7 MM X 45 CM SRTETCH GORETEX GRAFT;  Surgeon: Angelia Mould, MD;  Location: [place];  Service: Vascular;  Laterality: Left;  . Cardiac catheterization    . Tonsillectomy    . Eye surgery    . Endarterectomy Right  04/21/2015    Procedure: ENDARTERECTOMY CAROTID;  Surgeon: Angelia Mould, MD;  Location: [facility] OR;  Service: Vascular;  Laterality: Right;  . Knee surgery Left     Family History  Problem Relation Age of Onset  . Diabetes Mother   . Hypertension Mother   . Heart disease Mother     beofre age 58  . Heart attack Mother   . Stroke Mother   . Cancer Father   . Hyperlipidemia Father   . Hypertension Father   . Deep vein thrombosis Daughter   . Diabetes Daughter   . Hyperlipidemia Daughter   . Hypertension Daughter   . Heart disease Daughter   . Peripheral vascular disease Daughter     Social History:  Married. 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 independent with cane but sedentary reports that she has never smoked. She has never used smokeless tobacco. She reports that she does not drink alcohol or use illicit drugs.     Allergies  Allergen Reactions  . Gabapentin Other (See Comments)    hallucinations  . Amlodipine     Other reaction(s): edema  . Other     Other reaction(s): Fatigue, Gout, Hallucintions  . Sulfa Antibiotics     Other reaction(s): psychiatric disturbances   Medications Prior to Admission  Medication Sig Dispense Refill  . acetaminophen (TYLENOL) 500 MG tablet Take 500 mg by mouth every 6 (six) hours as needed. For pain    . allopurinol (ZYLOPRIM) 100 MG tablet Take 100 mg by mouth 2 (two) times daily.     Marland Kitchen amLODipine (NORVASC) 10 MG tablet TAKE ONE TABLET BY MOUTH ONE TIME DAILY 30 tablet 9  . aspirin 325 MG tablet Take 325 mg by mouth daily.    Marland Kitchen atorvastatin (LIPITOR) 10 MG tablet TAKE ONE TABLET BY MOUTH ONE TIME DAILY (Patient taking differently: Take 10 mg by mouth daily. ) 30 tablet 0  . B Complex Vitamins (VITAMIN B COMPLEX PO) Take 1 tablet by mouth daily.    . brimonidine (ALPHAGAN P) 0.1 % SOLN Place 1 drop into the left eye 2 (two) times daily.    . calcitRIOL (ROCALTROL) 0.25 MCG capsule Take 0.25 mcg by mouth every  other day.    . Cholecalciferol (VITAMIN D) 2000 UNITS tablet Take 2,000 Units by mouth 2 (two) times daily.    . citalopram (CELEXA) 20 MG tablet Take 20 mg by mouth daily.    . colchicine 0.6 MG tablet Take 0.6 mg by mouth 2 (two) times daily as needed. gout    . Cyanocobalamin (VITAMIN B 12 PO) Take 1,000 mg by mouth.    . fish oil-omega-3 fatty acids 1000 MG capsule Take 2 g by mouth daily.    . furosemide (LASIX) 40 MG tablet Take 80 mg by mouth 2 (two) times daily.     Marland Kitchen  HUMALOG 100 UNIT/ML injection Inject 0-12 Units into the skin 3 (three) times daily with meals. Sliding scale    . insulin glargine (LANTUS) 100 UNIT/ML injection Inject 20-30 Units into the skin at bedtime.     Marland Kitchen linagliptin (TRADJENTA) 5 MG TABS tablet Take 1 tablet (5 mg total) by mouth daily with breakfast. 30 tablet 1  . metoprolol (LOPRESSOR) 50 MG tablet Take 50 mg by mouth 2 (two) times daily.    Marland Kitchen omeprazole (PRILOSEC) 20 MG capsule Take 20 mg by mouth daily as needed. Acid reflux    . oxycodone (OXY-IR) 5 MG capsule Take 5 mg by mouth every 6 (six) hours as needed for pain.    Marland Kitchen timolol (TIMOPTIC) 0.5 % ophthalmic solution Place 1 drop into the left eye daily.    . travoprost, benzalkonium, (TRAVATAN) 0.004 % ophthalmic solution Place 1 drop into the left eye at bedtime.      Home: Home Living Family/patient expects to be discharged to:: Private residence Living Arrangements: Spouse/significant other Available Help at Discharge: Family, Available 24 hours/day (husband and daughter) Type of Home: House Home Access: Level entry Home Layout: Two level, Able to live on main level with bedroom/bathroom (basement is main level (no stairs to enter)) Alternate Level Stairs-Number of Steps: flight Bathroom Shower/Tub: Gaffer, Door [employer] Toilet: Handicapped height Bathroom Accessibility: Yes Home Equipment: Environmental consultant - 4 wheels, Environmental consultant - 2 wheels, Cane - single point, Bedside commode, Grab bars -  toilet Additional Comments: Daughter lives in a house behind pt   Functional History: Prior Function Level of Independence: Needs assistance Gait / Transfers Assistance Needed: frequent falls due to imbalance; only uses cane when goes out; uses no device inside ADL's / Homemaking Assistance Needed: Daughter assists with dressing and IADLs  Functional Status:  Mobility: Bed Mobility Overal bed mobility: Needs Assistance Bed Mobility: Rolling, Sit to Sidelying Rolling: Min assist Sit to sidelying: Mod assist General bed mobility comments: HOB flat, heavy use of bedrails. Assist to move BLE onto bed and to position hips using bed pad. Verbal cues for proper log roll technique throughout however pt with poor return demonstration due to pain Transfers Overall transfer level: Needs assistance Equipment used: Rolling walker (2 wheeled) Transfers: Sit to/from Stand, Stand Pivot Transfers Sit to Stand: Min assist Stand pivot transfers: Min assist General transfer comment: Min assist for boost to stand, balance upon standing and balance during transfers. Verbal cues for safety, safe use of DME, and safe hand placement throughout transfers. Ambulation/Gait Ambulation/Gait assistance: Min assist Ambulation Distance (Feet): 5 Feet (x 2) Assistive device: Rolling walker (2 wheeled) Gait Pattern/deviations: Step-to pattern, Decreased stride length General Gait Details: Pt. with limited tolerance to gait due to increasing pain in low back.  Needs assist for stability/safety Gait velocity: decreased    ADL: ADL Overall ADL's : Needs assistance/impaired Grooming: Wash/dry hands, Sitting, Set up Upper Body Bathing: Moderate assistance, Sitting Lower Body Bathing: Maximal assistance, Sit to/from stand Lower Body Bathing Details (indicate cue type and reason): pt. states she is unable to cross legs over knees at this time, states her spouse will be able to assist at home with LB ADLS Upper Body  Dressing : Moderate assistance, Sitting Lower Body Dressing: Maximal assistance, Sit to/from stand Lower Body Dressing Details (indicate cue type and reason): pt. states she is unable to cross legs over knees at this time, states her spouse will be able to assist at home with LB ADLS Toilet Transfer: Minimal assistance, Ambulation,  BSC, RW, Cueing for safety Toilet Transfer Details (indicate cue type and reason): Heavy min assist for boost to stand and for balance. Pt only able to ambulate 4' and then requested BSC be brought to her due to increased pain. Toileting- Clothing Manipulation and Hygiene: Minimal assistance, Sit to/from stand, Cueing for safety Toileting - Clothing Manipulation Details (indicate cue type and reason): Cues for safe hand placement and balance. Assist for sit-stand and balance during pericare Functional mobility during ADLs: Minimal assistance, Rolling walker, Cueing for safety General ADL Comments: Pt required increased physical assistance this session and husband reports he and his daughter are unable to provide this level of assist at this time. Pt and husband agree that pt would not be safe to d/c home at this time and are agreeable to SNF for continued rehab.  Cognition: Cognition Overall Cognitive Status: Within Functional Limits for tasks assessed Orientation Level: Oriented X4 Cognition Arousal/Alertness: Lethargic, Suspect due to medications Behavior During Therapy: Flat affect Overall Cognitive Status: Within Functional Limits for tasks assessed Memory: Decreased recall of precautions   Physical Exam: Blood pressure 130/70, pulse 77, temperature 99.4 F (37.4 C), temperature source Oral, resp. rate 18, weight 86.183 kg (190 lb), SpO2 95 %. Physical Exam  Nursing note and vitals reviewed. Constitutional: She is oriented to person, place, and time. She appears well-developed and well-nourished. She appears distressed.  Lying on right side moaning and  groaning--reporting excruciating pain in back and legs (but  able to get up and walk 20 feet with PT after exam).   HENT:  Head: Normocephalic and atraumatic.  Mouth/Throat: Oropharynx is clear and moist.  Eyes: Conjunctivae and EOM are normal. Pupils are equal, round, and reactive to light. Right eye exhibits no discharge.  Neck: Normal range of motion. Neck supple.  Cardiovascular: Normal rate and regular rhythm.   Respiratory: Effort normal. No stridor. She has wheezes (expiratory wheezes).  GI: Soft. She exhibits no distension. Bowel sounds are decreased. There is no tenderness.  Musculoskeletal: She exhibits edema (2+ pedal edema and min edema B-tibially) and tenderness.  Positive thrill LUE graft  Neurological: She is alert and oriented to person, place, and time.  Patient is very hard of hearing.  Follows simple commands. DTRs symmetric Motor: B/l UE 4+/5 proximal to distal B/l LE: Hip flexion, knee extension 4-/5, ankle dorsi/plantar flexion 4/5  Skin: Skin is warm and dry.  Back incision clean and dry with dressing in place   Skin: Skin is warm and dry. No rash noted.  Back incision clean and dry  Psychiatric: She has a normal mood and affect. Her behavior is normal.    Results for orders placed or performed during the hospital encounter of 12/01/15 (from the past 48 hour(s))  Glucose, capillary     Status: Abnormal   Collection Time: 12/02/15  6:16 PM  Result Value Ref Range   Glucose-Capillary 193 (H) 65 - 99 mg/dL  Glucose, capillary     Status: Abnormal   Collection Time: 12/02/15  9:29 PM  Result Value Ref Range   Glucose-Capillary 185 (H) 65 - 99 mg/dL  CBC     Status: Abnormal   Collection Time: 12/03/15  5:02 AM  Result Value Ref Range   WBC 11.9 (H) 4.0 - 10.5 K/uL   RBC 2.97 (L) 3.87 - 5.11 MIL/uL   Hemoglobin 9.2 (L) 12.0 - 15.0 g/dL   HCT 28.4 (L) 36.0 - 46.0 %   MCV 95.6 78.0 - 100.0 fL  MCH 31.0 26.0 - 34.0 pg   MCHC 32.4 30.0 - 36.0 g/dL   RDW 15.5  11.5 - 15.5 %   Platelets 130 (L) 150 - 400 K/uL  Basic Metabolic Panel     Status: Abnormal   Collection Time: 12/03/15  5:02 AM  Result Value Ref Range   Sodium 132 (L) 135 - 145 mmol/L   Potassium 3.7 3.5 - 5.1 mmol/L   Chloride 99 (L) 101 - 111 mmol/L   CO2 22 22 - 32 mmol/L   Glucose, Bld 170 (H) 65 - 99 mg/dL   BUN 63 (H) 6 - 20 mg/dL   Creatinine, Ser 3.07 (H) 0.44 - 1.00 mg/dL   Calcium 8.3 (L) 8.9 - 10.3 mg/dL   GFR calc non Af Amer 14 (L) >60 mL/min   GFR calc Af Amer 16 (L) >60 mL/min    Comment: (NOTE) The eGFR has been calculated using the CKD EPI equation. This calculation has not been validated in all clinical situations. eGFR's persistently <60 mL/min signify possible Chronic Kidney Disease.    Anion gap 11 5 - 15  Glucose, capillary     Status: Abnormal   Collection Time: 12/03/15  7:12 AM  Result Value Ref Range   Glucose-Capillary 172 (H) 65 - 99 mg/dL  Glucose, capillary     Status: Abnormal   Collection Time: 12/03/15 11:26 AM  Result Value Ref Range   Glucose-Capillary 210 (H) 65 - 99 mg/dL  Glucose, capillary     Status: Abnormal   Collection Time: 12/03/15  4:24 PM  Result Value Ref Range   Glucose-Capillary 187 (H) 65 - 99 mg/dL   Comment 1 Notify RN   Glucose, capillary     Status: Abnormal   Collection Time: 12/03/15  9:19 PM  Result Value Ref Range   Glucose-Capillary 201 (H) 65 - 99 mg/dL  CBC     Status: Abnormal   Collection Time: 12/04/15  3:16 AM  Result Value Ref Range   WBC 9.1 4.0 - 10.5 K/uL   RBC 2.73 (L) 3.87 - 5.11 MIL/uL   Hemoglobin 8.4 (L) 12.0 - 15.0 g/dL   HCT 25.9 (L) 36.0 - 46.0 %   MCV 94.9 78.0 - 100.0 fL   MCH 30.8 26.0 - 34.0 pg   MCHC 32.4 30.0 - 36.0 g/dL   RDW 15.5 11.5 - 15.5 %   Platelets 133 (L) 150 - 400 K/uL  Basic Metabolic Panel     Status: Abnormal   Collection Time: 12/04/15  3:16 AM  Result Value Ref Range   Sodium 134 (L) 135 - 145 mmol/L   Potassium 3.6 3.5 - 5.1 mmol/L   Chloride 102 101 - 111  mmol/L   CO2 21 (L) 22 - 32 mmol/L   Glucose, Bld 167 (H) 65 - 99 mg/dL   BUN 66 (H) 6 - 20 mg/dL   Creatinine, Ser 3.00 (H) 0.44 - 1.00 mg/dL   Calcium 8.5 (L) 8.9 - 10.3 mg/dL   GFR calc non Af Amer 14 (L) >60 mL/min   GFR calc Af Amer 16 (L) >60 mL/min    Comment: (NOTE) The eGFR has been calculated using the CKD EPI equation. This calculation has not been validated in all clinical situations. eGFR's persistently <60 mL/min signify possible Chronic Kidney Disease.    Anion gap 11 5 - 15  Glucose, capillary     Status: Abnormal   Collection Time: 12/04/15  6:14 AM  Result Value Ref Range  Glucose-Capillary 155 (H) 65 - 99 mg/dL  Glucose, capillary     Status: Abnormal   Collection Time: 12/04/15 12:13 PM  Result Value Ref Range   Glucose-Capillary 157 (H) 65 - 99 mg/dL   Comment 1 Notify RN    No results found.     Medical Problem List and Plan: 1.  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 2.  DVT Prophylaxis/Anticoagulation: Pharmaceutical: Lovenox 3. Pain Management:  Symptoms worse since surgery--will simplify to oxycodone 5 mg prn.  Schedule robaxin to help with muscle spasms and low dose ultram.  K pad additionally for local measures.  4. Mood: Needs a lot of encouragement and ego support as is self limiting. LCSW to follow for evaluation and support.  5. Neuropsych: This patient is capable of making decisions on her own behalf. 6. Skin/Wound Care: Monitor wound daily for healing 7. Fluids/Electrolytes/Nutrition: Monitor I/O. Check lytes in am.   8. T2DM: Will monitor BS ac/hs. Continue lantus at bedtime with trajenta in am. Will use SSI for elevated BS.  9. CAD:  Continue lopressor and lipitor.  10. Wheezing: denies increase in chronic SOB. Will check CXR to rule out CHF. 12. CKD with BLE edema: Continue lasix bid ( may need additional dose to help with diuresis) Peripheral edema likely due to fluid  overload. Will recheck labs in am--Cr up to 3.0. Elevate BLE and ace wrap--likely not tolerate TEDs due to worsening of neuropathy.  13. Abdominal pain: Likely due to constipation--question Ileus question bladder distension. Will add miralax bid for now.  Check KUB.  14. Urinary retention: Check UA UCS to rule out UTI. Monitor PVRs and cath for volumes > 350cc  15. Acute on chronic anemia due to CKD: has had iron and epo? injections in the past.  16. H/o gout: off colchicine  17. Thrombocytopenia: Cont to monitor 18. Hyponatremia: Cont to monitor 19. AFib: Monitor HR with increased activity 20. HTN: Monitor with increased activity   Post Admission Physician Evaluation: 1. Functional deficits secondary  to bilateral HNP of L5-S1 with radiculopathy s/p microdiskectomy on 12/02/15. 2. Patient is admitted to receive collaborative, interdisciplinary care between the physiatrist, rehab nursing staff, and therapy team. 3. Patient's level of medical complexity and substantial therapy needs in context of that medical necessity cannot be provided at a lesser intensity of care such as a SNF. 4. Patient has experienced substantial functional loss from his/her baseline which was documented above under the "Functional History" and "Functional Status" headings.  Judging by the patient's diagnosis, physical exam, and functional history, the patient has potential for functional progress which will result in measurable gains while on inpatient rehab.  These gains will be of substantial and practical use upon discharge  in facilitating mobility and self-care at the household level. 5. Physiatrist will provide 24 hour management of medical needs as well as oversight of the therapy plan/treatment and provide guidance as appropriate regarding the interaction of the two. 6. 24 hour rehab nursing will assist with safety, skin/wound care, disease management, pain management and patient education and help integrate therapy  concepts, techniques,education, etc. 7. PT will assess and treat for/with: Lower extremity strength, range of motion, stamina, balance, functional mobility, safety, adaptive techniques and equipment, woundcare, coping skills, pain control, education.   Goals are: Mod I/Supervision. 8. OT will assess and treat for/with: ADL's, functional mobility, safety, upper extremity strength, adaptive techniques and equipment, wound mgt, ego support, and community reintegration.  Goals are: Mod I/Supervision. Therapy may not proceed with showering this patient. 9. Case Management and Social Worker will assess and treat for psychological issues and discharge planning. 10. Team conference will be held weekly to assess progress toward goals and to determine barriers to discharge. 11. Patient will receive at least 3 hours of therapy per day at least 5 days per week. 12. ELOS: 7-11 days.       13. Prognosis:  excellent and good  Delice Lesch, MD  12/04/2015

## 2015-12-04 NOTE — PMR Pre-admission (Signed)
PMR Admission Coordinator Pre-Admission Assessment  Patient: [name] is an 75 y.o., female MRN: [id number] DOB: 07/05/1941 Height:   Weight: 86.183 kg (190 lb)              Insurance Information HMO: No     PPO:       PCP:       IPA:       80/20:       OTHER:   PRIMARY: Medicare A/B      Policy#: [id number]      Subscriber: [name] CM Name:        Phone#:       Fax#:   Pre-Cert#:        Employer: Retired Benefits:  Phone #:       Name: Checked in Valeria. Date: 11/12/04     Deduct: $1316      Out of Pocket Max: none      Life Max: unlimited CIR: 100%      SNF: 100 days Outpatient: 80%     Co-Pay: 20% Home Health: 100%      Co-Pay: none DME: 80%     Co-Pay: 20% Providers: patient's choice  SECONDARY: BCBS supplement      Policy#: [id number]      Subscriber: [name] CM Name:        Phone#:       Fax#:   Pre-Cert#:        Employer: REtired Benefits:  Phone #: [phone]     Name:   Eff. Date:       Deduct:        Out of Pocket Max:        Life Max:   CIR:        SNF:   Outpatient:       Co-Pay:   Home Health:        Co-Pay:   DME:       Co-Pay:    Emergency Contact Information Contact Information    Name Relation Home Work [place] F Spouse [phone]     Tilley,Mimmi Daughter [phone]       Current Medical History  Patient Admitting Diagnosis:  Bilateral herniation of L5-S1 with radiculopathy    History of Present Illness: A 75 y.o. right handed female with history of glaucoma, hypertension, atrial fibrillation maintained on aspirin 325 mg daily, CAD status post CABG, diabetes mellitus and peripheral neuropathy, chronic renal insufficiency with baseline creatinine 2.74-3.07 and TIA. Per chart review patient lives with spouse. Independent with a cane prior to admission with noted frequent falls. Two-level home with bedroom on first floor. Presented 12/01/2015 with persistent low back pain radiating to left lower extremity. MRI  imaging showed L5-S1 circumferential disc bulging, disc space height loss and moderate facet arthrosis with radiculopathy, moderate bilateral neuroforaminal stenosis. Also L4-S5 mild disc bulge and mild facet hypertrophy. Patient has failed conservative treatment. Underwent bilateral L5-S1 microdiscectomy 12/01/2015 per Dr. Lorin Mercy. Hospital course pain management. Acute blood loss anemia 8.4 and monitored. Creatinine currently 3.00. Bilateral LE dopplers done 12/04/15.  Dopplers negative for DVT.  Physical therapy evaluation completed and ongoing with recommendations of physical medicine rehabilitation consult.    Past Medical History  Past Medical History  Diagnosis Date  . Anemia   . Glaucoma   . Hypertension   . Blood transfusion   . GERD (gastroesophageal reflux disease)   . Arthritis   . Neuromuscular disorder ([place])  CARPEL TUNNEL  . Atrial fibrillation ([place])   . CAD (coronary artery disease)   . Carotid artery occlusion     Carotid Endartectom,y - left 2009.  Blockage Right being watched by Dr Scot Dock.  Marland Kitchen HOH (hard of hearing)   . Shortness of breath   . Depression   . History of kidney stones     passed  . Carotid stenosis   . Complication of anesthesia     pt. states that she was difficult to wake  . Stroke ([place])     hx of TIA  . 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 Cancer ([place])     .  top of head- melonoma  . Dysrhythmia   . History of pneumonia   . Diabetes mellitus without complication ([place])   . Myocardial infarction ([place])   . History of hiatal hernia   . Pneumonia     Family History  family history includes Cancer in her father; Deep vein thrombosis in her daughter; Diabetes in her daughter and mother; Heart attack in her mother; Heart disease in her daughter and mother; Hyperlipidemia in her daughter and father; Hypertension in her daughter, father, and mother; Peripheral vascular disease in her daughter; Stroke in her mother.  Prior  Rehab/Hospitalizations: Had HHPT about 8 months ago for her back  Has the patient had major surgery during 100 days prior to admission? No  Current Medications   Current facility-administered medications:  .  0.45 % sodium chloride infusion, , Intravenous, Continuous, Lanae Crumbly, PA-C, Last Rate: 75 mL/hr at 12/02/15 1109 .  0.9 %  sodium chloride infusion, , Intravenous, Continuous, Catalina Gravel, MD, Last Rate: 20 mL/hr at 12/01/15 0917 .  0.9 %  sodium chloride infusion, 250 mL, Intravenous, Continuous, Lanae Crumbly, PA-C, 250 mL at 12/01/15 1527 .  allopurinol (ZYLOPRIM) tablet 100 mg, 100 mg, Oral, BID, Lanae Crumbly, PA-C, Stopped at 12/04/15 1000 .  amLODipine (NORVASC) tablet 10 mg, 10 mg, Oral, Daily, Lanae Crumbly, PA-C, 10 mg at 12/04/15 0744 .  atorvastatin (LIPITOR) tablet 10 mg, 10 mg, Oral, Daily, Lanae Crumbly, PA-C, 10 mg at 12/04/15 [id number] .  brimonidine (ALPHAGAN) 0.15 % ophthalmic solution 1 drop, 1 drop, Both Eyes, BID, Lanae Crumbly, PA-C, 1 drop at 12/04/15 0758 .  calcitRIOL (ROCALTROL) capsule 0.25 mcg, 0.25 mcg, Oral, QODAY, Lanae Crumbly, PA-C, 0.25 mcg at 12/04/15 0744 .  citalopram (CELEXA) tablet 20 mg, 20 mg, Oral, Daily, Lanae Crumbly, PA-C, 20 mg at 12/04/15 0743 .  docusate sodium (COLACE) capsule 100 mg, 100 mg, Oral, BID, Lanae Crumbly, PA-C, 100 mg at 12/04/15 [id number] .  furosemide (LASIX) tablet 80 mg, 80 mg, Oral, BID, Lanae Crumbly, PA-C, 80 mg at 12/04/15 0743 .  HYDROcodone-acetaminophen (NORCO) 10-325 MG per tablet 1 tablet, 1 tablet, Oral, Q6H PRN, Lanae Crumbly, PA-C, 1 tablet at 12/04/15 1205 .  insulin aspart (novoLOG) injection 0-15 Units, 0-15 Units, Subcutaneous, TID WC, Lanae Crumbly, PA-C, 3 Units at 12/04/15 [phone] .  insulin glargine (LANTUS) injection 20 Units, 20 Units, Subcutaneous, QHS, Lanae Crumbly, PA-C, 20 Units at 12/03/15 2202 .  latanoprost (XALATAN) 0.005 % ophthalmic solution 1 drop, 1 drop, Left Eye, QHS, Lanae Crumbly, PA-C,  1 drop at 12/03/15 2036 .  linagliptin (TRADJENTA) tablet 5 mg, 5 mg, Oral, Q breakfast, Lanae Crumbly, PA-C, 5 mg at 12/04/15 0743 .  menthol-cetylpyridinium (  CEPACOL) lozenge 3 mg, 1 lozenge, Oral, PRN **OR** phenol (CHLORASEPTIC) mouth spray 1 spray, 1 spray, Mouth/Throat, PRN, Lanae Crumbly, PA-C .  methocarbamol (ROBAXIN) tablet 500 mg, 500 mg, Oral, Q6H PRN, 500 mg at 12/04/15 0237 **OR** methocarbamol (ROBAXIN) 500 mg in dextrose 5 % 50 mL IVPB, 500 mg, Intravenous, Q6H PRN, Lanae Crumbly, PA-C .  metoprolol (LOPRESSOR) tablet 50 mg, 50 mg, Oral, BID, Lanae Crumbly, PA-C, 50 mg at 12/04/15 0741 .  ondansetron [facility]) injection 4 mg, 4 mg, Intravenous, Q4H PRN, Lanae Crumbly, PA-C, 4 mg at 12/04/15 [id number] .  pantoprazole (PROTONIX) EC tablet 40 mg, 40 mg, Oral, Daily, Lanae Crumbly, PA-C, 40 mg at 12/04/15 0743 .  polyethylene glycol (MIRALAX / GLYCOLAX) packet 17 g, 17 g, Oral, Daily PRN, Lanae Crumbly, PA-C, 17 g at 12/03/15 0957 .  sodium chloride flush (NS) 0.9 % injection 3 mL, 3 mL, Intravenous, Q12H, Lanae Crumbly, PA-C, 3 mL at 12/04/15 0748 .  sodium chloride flush (NS) 0.9 % injection 3 mL, 3 mL, Intravenous, PRN, Lanae Crumbly, PA-C .  timolol (TIMOPTIC) 0.5 % ophthalmic solution 1 drop, 1 drop, Left Eye, Daily, Lanae Crumbly, PA-C, 1 drop at 12/04/15 [id number]  Patients Current Diet: Diet Carb Modified Fluid consistency:: Thin; Room service appropriate?: Yes Diet - low sodium heart healthy Diet - low sodium heart healthy  Precautions / Restrictions Precautions Precautions: Back, Fall Precaution Booklet Issued: No Precaution Comments: Pt unable to recall any back precautions due to being preoccupied with significant pain. Restrictions Weight Bearing Restrictions: No   Has the patient had 2 or more falls or a fall with injury in the past year?Yes.  Has had 2 falls with no injury.  Prior Activity Level Limited Community (1-2x/wk): Went out 1-2 X a week  Development worker, international aid /  Equipment Home Equipment: Environmental consultant - 4 wheels, Environmental consultant - 2 wheels, [place] - single point, Bedside commode, Grab bars - toilet  Prior Device Use: Indicate devices/aids used by the patient prior to current illness, exacerbation or injury? Walker and cane.  Has mostly been using a cane, but has a walker.  Prior Functional Level Prior Function Level of Independence: Needs assistance Gait / Transfers Assistance Needed: frequent falls due to imbalance; only uses cane when goes out; uses no device inside ADL's / Homemaking Assistance Needed: Daughter assists with dressing and IADLs  Self Care: Did the patient need help bathing, dressing, using the toilet or eating?  Independent  Indoor Mobility: Did the patient need assistance with walking from room to room (with or without device)? Independent  Stairs: Did the patient need assistance with internal or external stairs (with or without device)? Independent  Functional Cognition: Did the patient need help planning regular tasks such as shopping or remembering to take medications? Independent  Current Functional Level Cognition  Overall Cognitive Status: Within Functional Limits for tasks assessed Orientation Level: Oriented X4    Extremity Assessment (includes Sensation/Coordination)  Upper Extremity Assessment: Overall WFL for tasks assessed  Lower Extremity Assessment: Generalized weakness    ADLs  Overall ADL's : Needs assistance/impaired Grooming: Wash/dry hands, Sitting, Set up Upper Body Bathing: Moderate assistance, Sitting Lower Body Bathing: Maximal assistance, Sit to/from stand Lower Body Bathing Details (indicate cue type and reason): pt. states she is unable to cross legs over knees at this time, states her spouse will be able to assist at home with LB ADLS Upper Body Dressing : Moderate assistance, Sitting Lower  Body Dressing: Maximal assistance, Sit to/from stand Lower Body Dressing Details (indicate cue type and reason): pt.  states she is unable to cross legs over knees at this time, states her spouse will be able to assist at home with LB ADLS Toilet Transfer: Minimal assistance, Ambulation, BSC, RW, Cueing for safety Toilet Transfer Details (indicate cue type and reason): Heavy min assist for boost to stand and for balance. Pt only able to ambulate 4' and then requested BSC be brought to her due to increased pain. Toileting- Clothing Manipulation and Hygiene: Minimal assistance, Sit to/from stand, Cueing for safety Toileting - Clothing Manipulation Details (indicate cue type and reason): Cues for safe hand placement and balance. Assist for sit-stand and balance during pericare Functional mobility during ADLs: Minimal assistance, Rolling walker, Cueing for safety General ADL Comments: Pt required increased physical assistance this session and husband reports he and his daughter are unable to provide this level of assist at this time. Pt and husband agree that pt would not be safe to d/c home at this time and are agreeable to SNF for continued rehab.    Mobility  Overal bed mobility: Needs Assistance Bed Mobility: Rolling, Sit to Sidelying Rolling: Min assist Sit to sidelying: Mod assist General bed mobility comments: HOB flat, heavy use of bedrails. Assist to move BLE onto bed and to position hips using bed pad. Verbal cues for proper log roll technique throughout however pt with poor return demonstration due to pain    Transfers  Overall transfer level: Needs assistance Equipment used: Rolling walker (2 wheeled) Transfers: Sit to/from Stand, Stand Pivot Transfers Sit to Stand: Min assist Stand pivot transfers: Min assist General transfer comment: Min assist for boost to stand, balance upon standing and balance during transfers. Verbal cues for safety, safe use of DME, and safe hand placement throughout transfers.    Ambulation / Gait / Stairs / Wheelchair Mobility  Ambulation/Gait Ambulation/Gait assistance:  Museum/gallery curator (Feet): 5 Feet (x 2) Assistive device: Rolling walker (2 wheeled) Gait Pattern/deviations: Step-to pattern, Decreased stride length General Gait Details: Pt. with limited tolerance to gait due to increasing pain in low back.  Needs assist for stability/safety Gait velocity: decreased    Posture / Balance Balance Overall balance assessment: Needs assistance Sitting-balance support: No upper extremity supported, Feet supported Sitting balance-Leahy Scale: Fair Standing balance support: Bilateral upper extremity supported, During functional activity Standing balance-Leahy Scale: Poor Standing balance comment: Heavy reliance on RW for UE support to maintain balance    Special needs/care consideration BiPAP/CPAP No CPM No Continuous Drip IV No Dialysis No      Life Vest No Oxygen No Special Bed No Trach Size No Wound Vac (area) No     Skin Has a lumbar incision with dressing                            Bowel mgmt: Last BM 11/30/15 Bladder mgmt: Urinary retention post op Diabetic mgmt Yes, on both oral medications and insulin at home    Previous Home Environment Living Arrangements: Spouse/significant other Available Help at Discharge: Family, Available 24 hours/day (husband and daughter) Type of Home: House Home Layout: Two level, Able to live on main level with bedroom/bathroom (basement is main level (no stairs to enter)) Alternate Level Stairs-Number of Steps: flight Home Access: Level entry Bathroom Shower/Tub: Walk-in shower, Door [employer] Toilet: Handicapped height Bathroom Accessibility: Yes Additional Comments: Daughter lives in a house behind  pt  Discharge Living Setting Plans for Discharge Living Setting: Patient's home, House, Lives with (comment) (Lives with husband.) Type of Home at Discharge: House Discharge Home Layout: Two level, Able to live on main level with bedroom/bathroom Alternate Level Stairs-Number of Steps:  Flight Discharge Home Access: Level entry  Social/Family/Support Systems Patient Roles: Spouse, Parent, Other (Comment) (Has husband, daughter, 2 granddaughters.) Contact Information: Kammy Mancil - spouse Anticipated Caregiver: Husband, daughter Anticipated Caregiver's Contact Information: Jeneen Rinks - husband - [phone] Ability/Limitations of Caregiver: Husband can assist.  Dtr and granddaughters live close by. Caregiver Availability: 24/7 Discharge Plan Discussed with Primary Caregiver: Yes Is Caregiver In Agreement with Plan?: Yes Does Caregiver/Family have Issues with Lodging/Transportation while Pt is in Rehab?: No  Goals/Additional Needs Patient/Family Goal for Rehab: PT/OT mod I and supervision goals Expected length of stay: 7-11 days Cultural Considerations: [facility] Dietary Needs: Carb mod, med cal, thin liquids Equipment Needs: TBD Pt/Family Agrees to Admission and willing to participate: Yes Program Orientation Provided & Reviewed with Pt/Caregiver Including Roles  & Responsibilities: Yes  Decrease burden of Care through IP rehab admission: N/A  Possible need for SNF placement upon discharge: Not planned  Patient Condition: This patient's condition remains as documented in the consult dated 12/04/15, in which the Rehabilitation Physician determined and documented that the patient's condition is appropriate for intensive rehabilitative care in an inpatient rehabilitation facility. Will admit to inpatient rehab today.  Preadmission Screen Completed By:  Retta Diones, 12/04/2015 2:38 PM ______________________________________________________________________   Discussed status with Dr. Posey Pronto on 12/04/15 at  1438 and received telephone approval for admission today.  Admission Coordinator:  Retta Diones, time1438/Date05/22/17

## 2015-12-04 NOTE — Anesthesia Postprocedure Evaluation (Signed)
Anesthesia Post Note  Patient: [name]  Procedure(s) Performed: Procedure(s) (LRB): L5-S1 Decompression and Bilateral Microdiscectomy (N/A)  Patient location during evaluation: PACU Anesthesia Type: General Level of consciousness: awake and alert Pain management: pain level controlled Vital Signs Assessment: post-procedure vital signs reviewed and stable Respiratory status: spontaneous breathing, nonlabored ventilation, respiratory function stable and patient connected to nasal cannula oxygen Cardiovascular status: blood pressure returned to baseline and stable Postop Assessment: no signs of nausea or vomiting Anesthetic complications: no    Last Vitals:  Filed Vitals:   12/03/15 1934 12/04/15 0437  BP: 159/51 130/70  Pulse: 78 77  Temp: 37.7 C 37.4 C  Resp: 18 18    Last Pain:  Filed Vitals:   12/04/15 0526  PainSc: [place] Edward Turk

## 2015-12-04 NOTE — Interval H&P Note (Signed)
[name] was admitted today to Inpatient Rehabilitation with the diagnosis of bilateral HNP of L5-S1 with radiculopathy s/p microdiskectomy.  The patient's history has been reviewed, patient examined, and there is no change in status.  Patient continues to be appropriate for intensive inpatient rehabilitation.  I have reviewed the patient's chart and labs.  Questions were answered to the patient's satisfaction. The PAPE has been reviewed and assessment remains appropriate.  Dhaval Woo Lorie Phenix 12/04/2015, 8:45 PM

## 2015-12-04 NOTE — Progress Notes (Signed)
Subjective: Patient c/o back pain and leg pain.  Swelling bilat LE.  Denies sob.  Slow moving with therapy and PT recommending rehab.     Objective: Vital signs in last 24 hours: Temp:  [98.3 F (36.8 C)-99.9 F (37.7 C)] 99.4 F (37.4 C) (05/22 0437) Pulse Rate:  [75-78] 77 (05/22 0437) Resp:  [18] 18 (05/22 0437) BP: (130-159)/(51-70) 130/70 mmHg (05/22 0437) SpO2:  [95 %-99 %] 95 % (05/22 0437)  Intake/Output from previous day: 05/21 0701 - 05/22 0700 In: 480 [P.O.:480] Out: 725 [Urine:725] Intake/Output this shift:     Recent Labs  12/02/15 0531 12/03/15 0502 12/04/15 0316  HGB 9.1* 9.2* 8.4*    Recent Labs  12/03/15 0502 12/04/15 0316  WBC 11.9* 9.1  RBC 2.97* 2.73*  HCT 28.4* 25.9*  PLT 130* 133*    Recent Labs  12/03/15 0502 12/04/15 0316  NA 132* 134*  K 3.7 3.6  CL 99* 102  CO2 22 21*  BUN 63* 66*  CREATININE 3.07* 3.00*  GLUCOSE 170* 167*  CALCIUM 8.3* 8.5*   No results for input(s): LABPT, INR in the last 72 hours.  Exam:  Alert and oriented.  NAD.  Wound looks good.  steris intact.  No drainage or signs of infections.  bilat calves tender.  bilat LE swelling and pretib pitting edema.  No focal motor deficits.   Assessment/Plan: Moving slow with PT.  Needs rehab vs snf placement.  Do not feel that she will be safe at home. Rehab MD seeing patient now.  Will get venous doppler for  bilat le swelling and calf pain. D/c dilaudid and percocet.  Will give norco 10/325.     Jazleen Robeck M 12/04/2015, 11:36 AM

## 2015-12-04 NOTE — Progress Notes (Signed)
Occupational Therapy Treatment Patient Details Name: [name] MRN: [id number] DOB: January 18, 1941 Today's Date: 12/04/2015    History of present illness Pt. admitted with LBP and L LE radiculopathy, underwent L5-S1 decompression and bilateral microdiscectomies.  Pt. with h/o glaucoma, CAD, afib, h/o TIA, CKD, DM, MI.  Pt. 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     OT comments  Pt required increased physical assistance this session. Pt only able to ambulate 4' before requesting BSC be brought closer due to severe pain when mobilizing. Pt required heavy min assist for all transfers and ambulation due to balance deficits and decreased safety awareness. Do not feel that pt is safe to d/c home at this time - pt and husband agree and are now agreeable to post-acute rehab. Do not feel pt is a good candidate for CIR at this time - as it would be unlikely for her to tolerate 3 hours of intensive therapy per day due to uncontrolled pain. Currently recommend SNF for next venue of rehab care.   Follow Up Recommendations  SNF;Supervision/Assistance - 24 hour    Equipment Recommendations  Other (comment) (TBD in next venue)    Recommendations for Other Services      Precautions / Restrictions Precautions Precautions: Back;Fall Precaution Booklet Issued: No Precaution Comments: Pt unable to recall any back precautions due to being preoccupied with significant pain. Restrictions Weight Bearing Restrictions: No       Mobility Bed Mobility Overal bed mobility: Needs Assistance Bed Mobility: Rolling;Sit to Sidelying Rolling: Min assist       Sit to sidelying: Mod assist General bed mobility comments: HOB flat, heavy use of bedrails. Assist to move BLE onto bed and to position hips using bed pad. Verbal cues for proper log roll technique throughout however pt with poor return demonstration due to pain  Transfers Overall transfer level: Needs  assistance Equipment used: Rolling walker (2 wheeled) Transfers: Sit to/from [facility] Sit to Stand: Min assist Stand pivot transfers: Min assist       General transfer comment: Min assist for boost to stand, balance upon standing and balance during transfers. Verbal cues for safety, safe use of DME, and safe hand placement throughout transfers.    Balance Overall balance assessment: Needs assistance Sitting-balance support: No upper extremity supported;Feet supported Sitting balance-Leahy Scale: Fair     Standing balance support: Bilateral upper extremity supported;During functional activity Standing balance-Leahy Scale: Poor Standing balance comment: Heavy reliance on RW for UE support to maintain balance                   ADL Overall ADL's : Needs assistance/impaired         Upper Body Bathing: Moderate assistance;Sitting   Lower Body Bathing: Maximal assistance;Sit to/from stand   Upper Body Dressing : Moderate assistance;Sitting   Lower Body Dressing: Maximal assistance;Sit to/from stand   Toilet Transfer: Minimal assistance;Ambulation;BSC;RW;Cueing for safety Toilet Transfer Details (indicate cue type and reason): Heavy min assist for boost to stand and for balance. Pt only able to ambulate 4' and then requested BSC be brought to her due to increased pain. Toileting- Clothing Manipulation and Hygiene: Minimal assistance;Sit to/from stand;Cueing for safety Toileting - Clothing Manipulation Details (indicate cue type and reason): Cues for safe hand placement and balance. Assist for sit-stand and balance during pericare     Functional mobility during ADLs: Minimal assistance;Rolling walker;Cueing for safety General ADL Comments: Pt required increased physical assistance  this session and husband reports he and his daughter are unable to provide this level of assist at this time. Pt and husband agree that pt would not be safe to d/c home at this time  and are agreeable to SNF for continued rehab.      Vision                     Perception     Praxis      Cognition   Behavior During Therapy: Flat affect Overall Cognitive Status: Within Functional Limits for tasks assessed       Memory: Decreased recall of precautions               Extremity/Trunk Assessment               Exercises     Shoulder Instructions       General Comments      Pertinent Vitals/ Pain       Pain Assessment: Faces Faces Pain Scale: Hurts worst Pain Location: back Pain Descriptors / Indicators: Sore;Throbbing;Tender Pain Intervention(s): Limited activity within patient's tolerance;Monitored during session;Premedicated before session;Repositioned  Home Living                                          Prior Functioning/Environment              Frequency Min 2X/week     Progress Toward Goals  OT Goals(current goals can now be found in the care plan section)  Progress towards OT goals: Not progressing toward goals - comment (due to uncontrolled pain)  Acute Rehab OT Goals Patient Stated Goal: none stated OT Goal Formulation: With patient Time For Goal Achievement: 12/18/15 Potential to Achieve Goals: Good ADL Goals Pt Will Perform Grooming: with supervision;standing Pt Will Perform Lower Body Bathing: with supervision;with adaptive equipment;sit to/from stand Pt Will Perform Lower Body Dressing: with supervision;with adaptive equipment;sit to/from stand Pt Will Transfer to Toilet: with supervision;ambulating;bedside commode Pt Will Perform Toileting - Clothing Manipulation and hygiene: with supervision;sitting/lateral leans;sit to/from stand Pt Will Perform Tub/Shower Transfer: Shower transfer;with supervision;ambulating;shower seat;rolling walker  Plan Discharge plan needs to be updated;Frequency needs to be updated    Co-evaluation                 End of Session Equipment Utilized  During Treatment: Gait belt;Rolling walker   Activity Tolerance Patient limited by pain   Patient Left in bed;with call bell/phone within reach;with family/visitor present   Nurse Communication Mobility status        Time: [id number] OT Time Calculation (min): 27 min  Charges: OT General Charges $OT Visit: 1 Procedure OT Treatments $Self Care/Home Management : 23-37 mins  Redmond Baseman, OTR/L Pager: [phone] 12/04/2015, 9:13 AM

## 2015-12-05 ENCOUNTER — Inpatient Hospital Stay (HOSPITAL_COMMUNITY): Payer: Medicare Other | Admitting: Occupational Therapy

## 2015-12-05 ENCOUNTER — Inpatient Hospital Stay (HOSPITAL_COMMUNITY): Payer: Medicare Other | Admitting: Physical Therapy

## 2015-12-05 ENCOUNTER — Encounter (HOSPITAL_COMMUNITY): Payer: Self-pay | Admitting: Orthopaedic Surgery

## 2015-12-05 ENCOUNTER — Inpatient Hospital Stay (HOSPITAL_COMMUNITY): Payer: Medicare Other

## 2015-12-05 LAB — COMPREHENSIVE METABOLIC PANEL
ALBUMIN: 2.5 g/dL — AB (ref 3.5–5.0)
ALK PHOS: 54 U/L (ref 38–126)
ALT: 5 U/L — AB (ref 14–54)
ANION GAP: 13 (ref 5–15)
AST: 14 U/L — ABNORMAL LOW (ref 15–41)
BUN: 69 mg/dL — ABNORMAL HIGH (ref 6–20)
CALCIUM: 8.7 mg/dL — AB (ref 8.9–10.3)
CO2: 20 mmol/L — AB (ref 22–32)
CREATININE: 3.04 mg/dL — AB (ref 0.44–1.00)
Chloride: 100 mmol/L — ABNORMAL LOW (ref 101–111)
GFR calc non Af Amer: 14 mL/min — ABNORMAL LOW (ref >60)
GFR, EST AFRICAN AMERICAN: 16 mL/min — AB (ref >60)
GLUCOSE: 149 mg/dL — AB (ref 65–99)
Potassium: 3.8 mmol/L (ref 3.5–5.1)
SODIUM: 133 mmol/L — AB (ref 135–145)
TOTAL PROTEIN: 5.5 g/dL — AB (ref 6.5–8.1)
Total Bilirubin: 0.8 mg/dL (ref 0.3–1.2)

## 2015-12-05 LAB — URINE MICROSCOPIC-ADD ON

## 2015-12-05 LAB — URINALYSIS, ROUTINE W REFLEX MICROSCOPIC
Bilirubin Urine: NEGATIVE
GLUCOSE, UA: NEGATIVE mg/dL
Ketones, ur: NEGATIVE mg/dL
LEUKOCYTES UA: NEGATIVE
Nitrite: NEGATIVE
PH: 5 (ref 5.0–8.0)
PROTEIN: 100 mg/dL — AB
Specific Gravity, Urine: 1.014 (ref 1.005–1.030)

## 2015-12-05 LAB — CBC WITH DIFFERENTIAL/PLATELET
BASOS ABS: 0 10*3/uL (ref 0.0–0.1)
Basophils Relative: 0 %
Eosinophils Absolute: 0.2 10*3/uL (ref 0.0–0.7)
Eosinophils Relative: 2 %
HEMATOCRIT: 25.2 % — AB (ref 36.0–46.0)
HEMOGLOBIN: 8.2 g/dL — AB (ref 12.0–15.0)
LYMPHS PCT: 17 %
Lymphs Abs: 1.6 10*3/uL (ref 0.7–4.0)
MCH: 30.7 pg (ref 26.0–34.0)
MCHC: 32.5 g/dL (ref 30.0–36.0)
MCV: 94.4 fL (ref 78.0–100.0)
MONO ABS: 1.1 10*3/uL — AB (ref 0.1–1.0)
Monocytes Relative: 12 %
NEUTROS ABS: 6.1 10*3/uL (ref 1.7–7.7)
Neutrophils Relative %: 69 %
Platelets: 145 10*3/uL — ABNORMAL LOW (ref 150–400)
RBC: 2.67 MIL/uL — ABNORMAL LOW (ref 3.87–5.11)
RDW: 15.2 % (ref 11.5–15.5)
WBC: 8.9 10*3/uL (ref 4.0–10.5)

## 2015-12-05 LAB — GLUCOSE, CAPILLARY
GLUCOSE-CAPILLARY: 132 mg/dL — AB (ref 65–99)
GLUCOSE-CAPILLARY: 166 mg/dL — AB (ref 65–99)
GLUCOSE-CAPILLARY: 190 mg/dL — AB (ref 65–99)
Glucose-Capillary: 187 mg/dL — ABNORMAL HIGH (ref 65–99)

## 2015-12-05 MED ORDER — ENOXAPARIN SODIUM 40 MG/0.4ML ~~LOC~~ SOLN
40.0000 mg | SUBCUTANEOUS | Status: DC
  Administered 2015-12-05: 40 mg via SUBCUTANEOUS
  Filled 2015-12-05: qty 0.4

## 2015-12-05 MED ORDER — ENOXAPARIN SODIUM 30 MG/0.3ML ~~LOC~~ SOLN
30.0000 mg | SUBCUTANEOUS | Status: DC
  Administered 2015-12-06 – 2015-12-14 (×9): 30 mg via SUBCUTANEOUS
  Filled 2015-12-05 (×9): qty 0.3

## 2015-12-05 MED ORDER — TRAMADOL HCL 50 MG PO TABS
50.0000 mg | ORAL_TABLET | Freq: Three times a day (TID) | ORAL | Status: DC
  Administered 2015-12-05 – 2015-12-15 (×38): 50 mg via ORAL
  Filled 2015-12-05 (×39): qty 1

## 2015-12-05 NOTE — Care Management Note (Signed)
[facility] Individual Statement of Services  Patient Name:  [name]  Date:  12/05/2015  Welcome to the [facility].  Our goal is to provide you with an individualized program based on your diagnosis and situation, designed to meet your specific needs.  With this comprehensive rehabilitation program, you will be expected to participate in at least 3 hours of rehabilitation therapies Monday-Friday, with modified therapy programming on the weekends.  Your rehabilitation program will include the following services:  Physical Therapy (PT), Occupational Therapy (OT), 24 hour per day rehabilitation nursing, Therapeutic Recreaction (TR), Neuropsychology, Case Management (Social Worker), Rehabilitation Medicine, Nutrition Services and Pharmacy Services  Weekly team conferences will be held on Tuesdays to discuss your progress.  Your Social Worker will talk with you frequently to get your input and to update you on team discussions.  Team conferences with you and your family in attendance may also be held.  Expected length of stay: 10-12 days  Overall anticipated outcome: supervision  Depending on your progress and recovery, your program may change. Your Social Worker will coordinate services and will keep you informed of any changes. Your Social Worker's name and contact numbers are listed  below.  The following services may also be recommended but are not provided by the [facility] will be made to provide these services after discharge if needed.  Arrangements include referral to agencies that provide these services.  Your insurance has been verified to be:  Medicare and [place] Your primary doctor is:  Dr. Inda Merlin  Pertinent information will be shared with your doctor and your insurance  company.  Social Worker:  [place], [place] or ([phone]   Information discussed with and copy given to patient by: Lennart Pall, 12/05/2015, 4:50 PM

## 2015-12-05 NOTE — Evaluation (Signed)
Physical Therapy Assessment and Plan  Patient Details  Name: [name] MRN: [id number] Date of Birth: [birth year]  PT Diagnosis: Abnormal posture, Abnormality of gait, Difficulty walking, Edema, Impaired sensation, Low back pain, Muscle weakness and Pain in L hip and L knee Rehab Potential: Good ELOS: 10-14 days   Today's Date: 12/05/2015 PT Individual Time:[phone] and  1353-1420 PT Individual Time Calculation (min): 76 min and 27 min    Problem List:  Patient Active Problem List   Diagnosis Date Noted  . Lumbar back pain with radiculopathy affecting left lower extremity 12/04/2015  . Bilateral calf pain   . Pain   . Wheezing   . Abnormality of gait   . Radiculopathy of lumbosacral region   . Post-operative pain   . Type 2 diabetes mellitus with peripheral neuropathy (HCC)   . Coronary artery disease involving native coronary artery of native heart without angina pectoris   . CKD (chronic kidney disease)   . AP (abdominal pain)   . Urinary retention   . Anemia of chronic disease   . Acute blood loss anemia   . Hx of gout   . Thrombocytopenia ([place])   . Hyponatremia   . Persistent atrial fibrillation ([place])   . Benign essential HTN   . S/P lumbar discectomy 12/01/2015  . 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 Acute delirium   . Acute encephalopathy   . Chronic kidney disease (CKD), stage V ([place]) 07/13/2015  . 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 Hypertension   . Glaucoma   . GERD (gastroesophageal reflux disease)   . Atrial fibrillation ([place])   . CAD (coronary artery disease)   . Depression   . Carotid stenosis   . Stroke ([place])   . Diabetes mellitus without complication ([place])   . Gastroesophageal reflux disease with esophagitis   . Carotid artery stenosis 04/21/2015  . Coronary arteriosclerosis 06/08/2014  . Essential hypertension 06/08/2014  . Hyperlipidemia 06/08/2014  . End  stage renal disease ([place]) 05/18/2014  . Occlusion and stenosis of carotid artery without mention of cerebral infarction 01/01/2012    Past Medical History:  Past Medical History  Diagnosis Date  . Anemia   . Glaucoma   . Hypertension   . Blood transfusion   . GERD (gastroesophageal reflux disease)   . Arthritis   . Neuromuscular disorder ([place])     CARPEL TUNNEL  . Atrial fibrillation ([place])   . CAD (coronary artery disease)   . Carotid artery occlusion     Carotid Endartectom,y - left 2009.  Blockage Right being watched by Dr Scot Dock.  Marland Kitchen HOH (hard of hearing)   . Shortness of breath   . Depression   . History of kidney stones     passed  . Carotid stenosis   . Complication of anesthesia     pt. states that she was difficult to wake  . Stroke ([place])     hx of TIA  . 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 Cancer ([place])     .  top of head- melonoma  . Dysrhythmia   . History of pneumonia   . Diabetes mellitus without complication ([place])   . Myocardial infarction ([place])   . History of hiatal hernia   . Pneumonia    Past Surgical History:  Past Surgical History  Procedure Laterality Date  . Carpel tunnel    . Neck  surgery    . Esophagogastroduodenoscopy  07/25/2011    Procedure: ESOPHAGOGASTRODUODENOSCOPY (EGD);  Surgeon: Winfield Cunas., MD;  Location: [facility] ENDOSCOPY;  Service: Endoscopy;  Laterality: N/A;  . Colonoscopy  07/25/2011    Procedure: COLONOSCOPY;  Surgeon: Winfield Cunas., MD;  Location: [facility] ENDOSCOPY;  Service: Endoscopy;  Laterality: N/A;  . Coronary artery bypass graft  07/29/2011    Procedure: CORONARY ARTERY BYPASS GRAFTING (CABG);  Surgeon: Gaye Pollack, MD;  Location: [facility];  Service: Open Heart Surgery;  Laterality: N/A;  . Carotid endarterectomy Left 2009     CEA  . Av fistula placement Left 05/31/2014    Procedure: Creation of Left Arm arteriovenous brachiocephalic Fistula;  Surgeon: Angelia Mould, MD;  Location: [facility];   Service: Vascular;  Laterality: Left;  . Fistulogram Left 08/29/2014    Procedure: FISTULOGRAM;  Surgeon: Angelia Mould, MD;  Location: [facility] CATH LAB;  Service: Cardiovascular;  Laterality: Left;  . Av fistula placement Left 09/01/2014    Procedure: INSERTION OF ARTERIOVENOUS (AV) GORE-TEX GRAFT LEFT UPPER ARM USING  4-7 MM X 45 CM SRTETCH GORETEX GRAFT;  Surgeon: Angelia Mould, MD;  Location: [facility];  Service: Vascular;  Laterality: Left;  . Cardiac catheterization    . Tonsillectomy    . Eye surgery    . Endarterectomy Right 04/21/2015    Procedure: ENDARTERECTOMY CAROTID;  Surgeon: Angelia Mould, MD;  Location: [facility];  Service: Vascular;  Laterality: Right;  . Knee surgery Left   . Lumbar laminectomy/decompression microdiscectomy N/A 12/01/2015    Procedure: L5-S1 Decompression and Bilateral Microdiscectomy;  Surgeon: Marybelle Killings, MD;  Location: [facility];  Service: Orthopedics;  Laterality: N/A;    Assessment & Plan Clinical Impression: Patient is a 75 y.o. right handed female with history of glaucoma, hypertension, atrial fibrillation maintained on aspirin 325 mg daily, CAD status post CABG, diabetes mellitus and peripheral neuropathy, chronic renal insufficiency with baseline creatinine 2.74-3.07 and TIA. Per chart review patient lives with spouse. Independent with a cane prior to admission with noted frequent falls. Two-level home with bedroom on first floor. Presented 12/01/2015 with persistent low back pain radiating to left lower extremity. MRI imaging showed L5-S1 circumferential disc bulging, disc space height loss and moderate facet arthrosis with radiculopathy, moderate bilateral neuroforaminal stenosis. Also L4-S5 mild disc bulge and mild facet hypertrophy. Patient has failed conservative treatment. Underwent bilateral L5-S1 microdiscectomy 12/01/2015 per Dr. Lorin Mercy. Hospital course pain management. Acute blood loss anemia 8.4 and monitored. Creatinine currently 3.00.  Bilateral LE dopplers done 12/04/15. Dopplers negative for DVT.  Patient transferred to CIR on 12/04/2015 .   Patient currently requires mod-max with mobility secondary to muscle weakness and pain and LE edema, decreased cardiorespiratoy endurance and decreased sitting balance, decreased standing balance, decreased postural control, decreased balance strategies and difficulty maintaining precautions.  Prior to hospitalization, patient was modified independent  with mobility and lived with Spouse in a House home.  Home access is  Level entry to basement but 10 steps to main level where pt cleans intermittently.  Patient will benefit from skilled PT intervention to maximize safe functional mobility, minimize fall risk and decrease caregiver burden for planned discharge home with 24 hour assist.  Anticipate patient will benefit from follow up [facility] at discharge.  PT - End of Session Activity Tolerance: Tolerates 10 - 20 min activity with multiple rests Endurance Deficit: Yes Endurance Deficit Description: pain PT Assessment Rehab Potential (ACUTE/IP ONLY): Good PT Patient  demonstrates impairments in the following area(s): Balance;Edema;Endurance;Motor;Pain;Sensory PT Transfers Functional Problem(s): Bed Mobility;Bed to Chair;Car;Furniture PT Locomotion Functional Problem(s): Ambulation;Wheelchair Mobility;Stairs PT Plan PT Intensity: Minimum of 1-2 x/day ,45 to 90 minutes PT Frequency: 5 out of 7 days PT Duration Estimated Length of Stay: 10-14 days PT Treatment/Interventions: Ambulation/gait training;Balance/vestibular training;Discharge planning;DME/adaptive equipment instruction;Functional mobility training;Neuromuscular re-education;Pain management;Patient/family education;Psychosocial support;Splinting/orthotics;Stair training;Therapeutic Activities;Therapeutic Exercise;UE/LE Strength taining/ROM;Wheelchair propulsion/positioning PT Transfers Anticipated Outcome(s): Supervision PT Locomotion  Anticipated Outcome(s): Supervision PT Recommendation Recommendations for Other Services: Neuropsych consult Follow Up Recommendations: Home health PT;24 hour supervision/assistance Patient destination: Home Equipment Recommended: Wheelchair (measurements);Wheelchair cushion (measurements) Equipment Details: manual w/c vs. transport w/c for community  Skilled Therapeutic Intervention AM session: pt sitting upright in bed requesting to use toilet.  Assisted pt with bed <> BSC transfer and toileting as described below with pt performing hygiene.  Pt participated in PT evaluation with focus on assessment of sensation, strength, gait and stair negotiation as below.  Pt extremely fearful and anxious about falling and very focused on pain.  Pt would intermittently begin to cry and speak in "baby talk" during session when therapist asked questions about home and PLOF.  Pt required max encouragement throughout session.  At end of session pt left in w/c with hot packs placed at L hip and low back for pain management; pt left with husband present to await OT session in 15 minutes.    PM session.  Pt still up in w/c and c/o significant low back and L hip pain but pt not due for pain medication for a few more hours.  Pt encouraged to take rest breaks in bed or recliner during longer rest breaks between therapy and to change positions.  Pt transported to gym to perform simulated car transfer.  Pt performed stand pivot and car transfers as described below with mod A to bring each LE into and out of car and max A for safe sit <> stand from low car.  Returned to room and pt ambulated w/c > recliner with min-mod A and pt positioned in recliner with LE elevated and back supported for LE edema management as pt is unable to don shoes currently.  Discussed with pt and husband ELOS, goals of therapy and focus of treatment.  Pt verbalized agreement; pt left with all items within reach.   PT  Evaluation Precautions/Restrictions Precautions Precautions: Back;Fall General Chart Reviewed: Yes Response to Previous Treatment: Patient reporting fatigue but able to participate. Family/Caregiver Present: Yes Vital SignsTherapy Vitals Temp: 98.5 F (36.9 C) Temp Source: Oral Pulse Rate: 65 Resp: 19 BP: (!) 139/50 mmHg Patient Position (if appropriate): Sitting Oxygen Therapy SpO2: 100 % O2 Device: Not Delivered Pain Pain Assessment Pain Assessment: 0-10 Pain Score: 8  Pain Type: Surgical pain Pain Location: Back Pain Orientation: Lower;Left Pain Descriptors / Indicators: Radiating;Throbbing Pain Onset: On-going Pain Intervention(s): Repositioned;Heat applied Home Living/Prior Functioning Home Living Available Help at Discharge: Family;Available 24 hours/day Type of Home: House Home Access: Level entry Home Layout: Two level;Able to live on main level with bedroom/bathroom Alternate Level Stairs-Number of Steps: 10 steps to upper level Alternate Level Stairs-Rails: Right Additional Comments: Daughter lives in a house behind pt  Lives With: Spouse Prior Function Level of Independence: Independent with transfers;Independent with gait;Requires assistive device for independence  Able to Take Stairs?: Yes Driving: No Vocation: Retired Leisure: Hobbies-yes (Comment) Comments: Pt states cleaning is her hobby; lives on basement level of house which was converted into living space with bedroom, bathroom.  Pt  has cane, RW and 4WW Vision/Perception     Cognition Arousal/Alertness: Awake/alert Orientation Level: Oriented X4 Attention: Alternating Memory: Appears intact Awareness: Appears intact Problem Solving: Appears intact Safety/Judgment: Appears intact Sensation Sensation Light Touch: Impaired Detail Light Touch Impaired Details: Impaired RLE;Impaired LLE Stereognosis: Not tested Hot/Cold: Not tested Proprioception: Impaired by gross assessment Additional  Comments: Peripheral Neuropathy in bilat hands and feet plus radiculopathy in LE from low back.  Pt reports stepping out of shoes and not knowing it at home.  Significant pitting edema in bilat LE. Coordination Gross Motor Movements are Fluid and Coordinated: Not tested Fine Motor Movements are Fluid and Coordinated: Not tested Motor  Motor Motor: Other (comment);Abnormal postural alignment and control Motor - Skilled Clinical Observations: LE weakness; trunk lists to R to avoid pressure on L lower back and hip.    Mobility Bed Mobility Bed Mobility: Supine to Sit;Sit to Supine Supine to Sit: 3: Mod assist Sit to Supine: 3: Mod assist Transfers Transfers: Yes Stand Pivot Transfers: 3: Mod assist;2: Max assist;From elevated surface Stand Pivot Transfer Details (indicate cue type and reason): Performed sit <> stand and stand pivot transfers bed <> BSC, bed <> w/c, w/c <> simulated car and w/c<>recliner.  Pt requires verbal cues for safe hand placement not to pull up on RW and requires lifting and controlled lowering assistance.  Pt also needs frequent encouragement and cues to initiate sit <> stand; pt frequently stating, "I can't, I'm too weak!"  Pt very fearful of falling and very pain focused.   Locomotion  Ambulation Ambulation: Yes Ambulation/Gait Assistance: 4: Min assist;3: Mod assist Ambulation Distance (Feet): 15 Feet Assistive device: Rolling walker Gait Gait: Yes Gait Pattern: Impaired Gait Pattern: Step-to pattern;Decreased step length - right;Decreased step length - left;Decreased stance time - left;Decreased weight shift to left;Antalgic;Lateral trunk lean to right Stairs / Additional Locomotion Stairs: Yes Stairs Assistance: 3: Mod assist Stairs Assistance Details (indicate cue type and reason): Pt very fearful of falling and anxious about stair negotiation.  Performed one step up, forwards to ascend, backwards to descend on 3" step with bilat UE support on rails and mod A  for weight shift and safety with verbal cues for safe step to sequence. Stair Management Technique: Two rails;Step to pattern;Backwards;Forwards Number of Stairs: 1 Height of Stairs: 3 Wheelchair Mobility Wheelchair Mobility: No  Trunk/Postural Assessment  Cervical Assessment Cervical Assessment: Within Functional Limits Thoracic Assessment Thoracic Assessment: Exceptions to [facility] (R trunk elongated, L trunk shortened; lists to R) Lumbar Assessment Lumbar Assessment: Exceptions to [facility] (increased lordosis) Lumbar AROM Overall Lumbar AROM: Due to precautions Postural Control Postural Control: Deficits on evaluation (impaired ankle strategy; falls posterior in standing)  Balance Balance Balance Assessed: Yes Static Sitting Balance Static Sitting - Level of Assistance: 4: Min assist Dynamic Sitting Balance Dynamic Sitting - Level of Assistance: 4: Min assist;3: Mod assist Static Standing Balance Static Standing - Balance Support: Bilateral upper extremity supported Static Standing - Level of Assistance: 4: Min assist Dynamic Standing Balance Dynamic Standing - Balance Support: Bilateral upper extremity supported Dynamic Standing - Level of Assistance: 4: Min assist;3: Mod assist Extremity Assessment  RUE Assessment RUE Assessment: Within Functional Limits LUE Assessment LUE Assessment: Within Functional Limits RLE Assessment RLE Assessment: Exceptions to [facility] (3/5 hip flexion and ankle DF, 4/5 knee flexion/extension ) LLE Assessment LLE Assessment: Exceptions to [facility] (3/5 hip flexion and ankle DF; 4-/5 knee flexion/extension)   See Function Navigator for Current Functional Status.   Refer to Care Plan  for Long Term Goals  Recommendations for other services: Neuropsych  Discharge Criteria: Patient will be discharged from PT if patient refuses treatment 3 consecutive times without medical reason, if treatment goals not met, if there is a change in medical status, if patient  makes no progress towards goals or if patient is discharged from hospital.  The above assessment, treatment plan, treatment alternatives and goals were discussed and mutually agreed upon: by patient and by family  Malachy Mood 12/05/2015, 5:02 PM

## 2015-12-05 NOTE — Progress Notes (Signed)
Social Work Patient ID: [name], female   DOB: August 31, 1940, 75 y.o.   MRN: [id number]   Lowella Curb, LCSW Social Worker Signed  Patient Care Conference 12/05/2015  3:58 PM    Expand All Collapse All   Inpatient RehabilitationTeam Conference and Plan of Care Update Date: 12/05/2015   Time: 2:10 PM     Patient Name: [name]       Medical Record Number: [id number]  Date of Birth: [birth year] Sex: Female         Room/Bed: 4W22C/4W22C-01 Payor Info: Payor: MEDICARE / Plan: MEDICARE PART A AND B / Product Type: *No Product type* /    Admitting Diagnosis: Lumbar Microdiscectomy  Admit Date/Time:  12/04/2015  4:45 PM Admission Comments: No comment available   Primary Diagnosis:  <principal problem not specified> Principal Problem: <principal problem not specified>    Patient Active Problem List     Diagnosis  Date Noted   .  Lumbar back pain with radiculopathy affecting left lower extremity  12/04/2015   .  Bilateral calf pain     .  Pain     .  Wheezing     .  Abnormality of gait     .  Radiculopathy of lumbosacral region     .  Post-operative pain     .  Type 2 diabetes mellitus with peripheral neuropathy (HCC)     .  Coronary artery disease involving native coronary artery of native heart without angina pectoris     .  CKD (chronic kidney disease)     .  AP (abdominal pain)     .  Urinary retention     .  Anemia of chronic disease     .  Acute blood loss anemia     .  Hx of gout     .  Thrombocytopenia ([place])     .  Hyponatremia     .  Persistent atrial fibrillation ([place])     .  Benign essential HTN     .  S/P lumbar discectomy  12/01/2015   .  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  Acute delirium     .  Acute encephalopathy     .  Chronic kidney disease (CKD), stage V ([place])  07/13/2015   .  Stroke (cerebrum) ([place])  07/13/2015   .  Hypertension     .  Glaucoma     .  GERD  (gastroesophageal reflux disease)     .  Atrial fibrillation ([place])     .  CAD (coronary artery disease)     .  Depression     .  Carotid stenosis     .  Stroke ([place])     .  Diabetes mellitus without complication ([place])     .  Gastroesophageal reflux disease with esophagitis     .  Carotid artery stenosis  04/21/2015   .  Coronary arteriosclerosis  06/08/2014   .  Essential hypertension  06/08/2014   .  Hyperlipidemia  06/08/2014   .  End stage renal disease ([place])  05/18/2014   .  Occlusion and stenosis of carotid artery without mention of cerebral infarction  01/01/2012     Expected Discharge Date: Expected Discharge Date: 12/15/15  Team Members Present: Physician leading conference: Dr. Alger Simons Social Worker Present: Lennart Pall, LCSW Nurse Present: Heather Roberts, RN PT  Present: Canary Brim, PT OT Present: Napoleon Form, OT SLP Present: Weston Anna, SLP PPS Coordinator present : Daiva Nakayama, RN, CRRN        Current Status/Progress  Goal  Weekly Team Focus   Medical     lumbar spine pain/radiculopathy s/p discectomy, pain issues, anemic  improve activity tolerance  wound care, pain mgt, electrolyte mgt,     Bowel/Bladder     cont x2; LBM 5/22  Remain cont x2 while on Rehab  Continue toileting patient with appropriate devices    Swallow/Nutrition/ Hydration               ADL's     Min - Mod A with B & D, Mod A transfers   Overall Supervision for B & D using AE, Mod I for toileting   Adherence to back precautions, transfers, AE training, pain management, family ed   Mobility     Mod-max A overall  Supervision  pain/anxiety management, activity tolerance, transfers, gait and balance   Communication               Safety/Cognition/ Behavioral Observations              Pain     scheduled tramadol, PRN oxy  < 4 on 0-10 pain scale  Assess and treat pain as patient begins therapy    Skin     Surgical site with steris and foam   No new skin infection/breakdown while on  Rehab   Continue to assess skin qshift    Rehab Goals Patient on target to meet rehab goals: Yes *See Care Plan and progress notes for long and short-term goals.    Barriers to Discharge:  pain, anemia, activity tolerance     Possible Resolutions to Barriers:   mgt of anemia, pain mgt, strength/activity training      Discharge Planning/Teaching Needs:   Plan home with spouse who can provide 24/7 assistance   TBD    Team Discussion:    New eval.  Pain and anxiety are issues.  Mostly mod assist on eval and setting supervision to min assist goals.   Revisions to Treatment Plan:    None    Continued Need for Acute Rehabilitation Level of Care: The patient requires daily medical management by a physician with specialized training in physical medicine and rehabilitation for the following conditions: Daily direction of a multidisciplinary physical rehabilitation program to ensure safe treatment while eliciting the highest outcome that is of practical value to the patient.: Yes Daily medical management of patient stability for increased activity during participation in an intensive rehabilitation regime.: Yes Daily analysis of laboratory values and/or radiology reports with any subsequent need for medication adjustment of medical intervention for : Post surgical problems;Neurological problems;Wound care problems;Renal problems  [name] 12/05/2015, 3:58 PM

## 2015-12-05 NOTE — Progress Notes (Signed)
Occupational Therapy Session Note  Patient Details  Name: [name] MRN: [id number] Date of Birth: June 29, 1941  Today's Date: 12/05/2015 OT Individual Time: 1530-1601 OT Individual Time Calculation (min): 31 min    Short Term Goals: Week 1:  OT Short Term Goal 1 (Week 1): Pt will complete lower body dressing using AE to maintain back precatuions 100% of the time OT Short Term Goal 2 (Week 1): Pt will complete toilet hygiene independently OT Short Term Goal 3 (Week 1): Pt will bathe, sitting and standing with steadying assist OT Short Term Goal 4 (Week 1): Pt will demo ability to perform light home making task using RW with min assist  Skilled Therapeutic Interventions/Progress Updates:  Upon entering the room, pt seated in recliner chair with husband present in the room. Pt reports 3/10 lower back pain described as dull ache this session but agreeable to OT intervention. Pt able to verbalize 3/3 back precautions. OT educated and demonstrated use of sock aid in order to increase I in LB dressing task. Pt returned demonstrations with min cues for proper technique to don socks on B feet. Pt then verbalizing need for toileting. Max A for toilet transfer onto [facility]. Pt able to perform clothing management before voiding and hygiene with mod - max A for balance. Pt returning to recliner chair at end of session. Husband remains present in room. Pt requesting to "wash up" and NT notified as OT exited the room. Call bell and all needed items within reach upon exiting the room.   Therapy Documentation Precautions:  Precautions Precautions: Back, Fall Precaution Booklet Issued: No Precaution Comments: 2 of 3 BAT recalled during OT eval Restrictions Weight Bearing Restrictions: No Vital Signs: Therapy Vitals Temp: 98.5 F (36.9 C) Temp Source: Oral Pulse Rate: 65 Resp: 19 BP: (!) 139/50 mmHg Patient Position (if appropriate): Sitting Oxygen Therapy SpO2: 100 % O2 Device: Not  Delivered Pain: Pain Assessment Pain Assessment: 0-10 Pain Score: 3  Pain Type: Acute pain Pain Location: Back Pain Orientation: Lower Pain Intervention(s): Repositioned ADL: ADL ADL Comments: see Functional Assessment Tool  See Function Navigator for Current Functional Status.   Therapy/Group: Individual Therapy  Phineas Semen 12/05/2015, 4:07 PM

## 2015-12-05 NOTE — Progress Notes (Signed)
[place] PHYSICAL MEDICINE & REHABILITATION     PROGRESS NOTE    Subjective/Complaints: Has had a rough day due to pain. It is no better than prior to operation.   ROS: Pt denies fever, rash/itching, headache, blurred or double vision, nausea, vomiting,, diarrhea, chest pain, shortness of breath, palpitations, dysuria, dizziness, neck  pain, bleeding,  or depression  Objective: Vital Signs: Blood pressure 130/64, pulse 77, temperature 98.2 F (36.8 C), temperature source Oral, resp. rate 17, SpO2 94 %. Dg Chest 2 View  12/04/2015  CLINICAL DATA:  Wheezing. EXAM: CHEST  2 VIEW COMPARISON:  Nov 23, 2015. FINDINGS: Stable cardiomediastinal silhouette. Status post coronary artery bypass graft. No pneumothorax or pleural effusion is noted. No acute pulmonary disease is noted. Bony thorax is unremarkable. IMPRESSION: No active cardiopulmonary disease. Electronically Signed   By: Marijo Conception, M.D.   On: 12/04/2015 20:26   Dg Abd 1 View  12/04/2015  CLINICAL DATA:  Abdominal pain. EXAM: ABDOMEN - 1 VIEW COMPARISON:  None. FINDINGS: The bowel gas pattern is normal. No radio-opaque calculi or other significant radiographic abnormality are seen. IMPRESSION: No evidence of bowel obstruction or ileus. Electronically Signed   By: Marijo Conception, M.D.   On: 12/04/2015 20:28    Recent Labs  12/04/15 0316 12/05/15 0450  WBC 9.1 8.9  HGB 8.4* 8.2*  HCT 25.9* 25.2*  PLT 133* 145*    Recent Labs  12/04/15 0316 12/05/15 0450  NA 134* 133*  K 3.6 3.8  CL 102 100*  GLUCOSE 167* 149*  BUN 66* 69*  CREATININE 3.00* 3.04*  CALCIUM 8.5* 8.7*   CBG (last 3)   Recent Labs  12/04/15 1829 12/04/15 2129 12/05/15 0633  GLUCAP 172* 192* 132*    Wt Readings from Last 3 Encounters:  12/01/15 86.183 kg (190 lb)  11/23/15 86.274 kg (190 lb 3.2 oz)  11/08/15 83.462 kg (184 lb)    Physical Exam:   Nursing note and vitals reviewed. Constitutional: She is oriented to person, place, and  time. She appears well-developed and well-nourished. She appears distressed.  Lying on right side moaning and groaning--reporting excruciating pain in back and legs (but able to get up and walk 20 feet with PT after exam).  HENT:  Head: Normocephalic and atraumatic.  Mouth/Throat: Oropharynx is clear and moist.  Eyes: Conjunctivae and EOM are normal. Pupils are equal, round, and reactive to light. Right eye exhibits no discharge.  Neck: Normal range of motion. Neck supple.  Cardiovascular: Normal rate and regular rhythm.  Respiratory: Effort normal. No stridor. She has wheezes (expiratory wheezes).  GI: Soft. She exhibits no distension. Bowel sounds are decreased. There is no tenderness.  Musculoskeletal: She exhibits edema (2+ pedal edema and min edema B-tibially) and tenderness.  Positive thrill LUE graft  Neurological: She is alert and oriented to person, place, and time.  Patient is very hard of hearing.  Follows simple commands. DTRs symmetric Motor: B/l UE 4+/5 proximal to distal B/l LE: Hip flexion, knee extension 4-/5, ankle dorsi/plantar flexion 4/5  Skin: Skin is warm and dry.  Back incision clean and dry with dressing in place  Skin: Skin is warm and dry. No rash noted.  Back incision clean and dry with steristrps and foam dressing Psychiatric: She has a normal mood and affect. Her behavior is anxious  .  Assessment/Plan: 1. 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 Physiatrist is providing close team supervision and 24 hour management of active medical problems listed below. Physiatrist and rehab team continue to assess barriers to discharge/monitor patient progress toward functional and medical goals.  Function:  Bathing Bathing position      Bathing parts      Bathing assist        Upper Body  Dressing/Undressing Upper body dressing                    Upper body assist        Lower Body Dressing/Undressing Lower body dressing                                  Lower body assist        Toileting Toileting   Toileting steps completed by patient: Adjust clothing prior to toileting Toileting steps completed by helper: Performs perineal hygiene, Adjust clothing after toileting Toileting Assistive Devices: Prosthesis/orthosis  Toileting assist Assist level: More than reasonable time, Set up/obtain supplies, Two helpers   Transfers Chair/bed Clinical biochemist          Cognition Comprehension Comprehension assist level: Follows basic conversation/direction with extra time/assistive device  Expression Expression assist level: Expresses basic needs/ideas: With no assist  Social Interaction Social Interaction assist level: Interacts appropriately 90% of the time - Needs monitoring or encouragement for participation or interaction.  Problem Solving Problem solving assist level: Solves basic 90% of the time/requires cueing < 10% of the time  Memory Memory assist level: Recognizes or recalls 50 - 74% of the time/requires cueing 25 - 49% of the time   Medical Problem List and Plan: 1. 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  -begin CIR therapies 2. DVT Prophylaxis/Anticoagulation: Pharmaceutical: Lovenox 3. Pain Management: pain no better than prior to surgery per patient  -oxycodone increased to 10mg . Scheduled robaxin to help with muscle spasms and low dose ultram 50mg  qid  -. K pad additionally for local measures.  4. Mood: Needs a lot of encouragement and ego support as is self limiting. LCSW to follow for evaluation and support.  5. Neuropsych: This patient is capable of making decisions on her own behalf. 6. Skin/Wound Care:  Monitor wound daily for healing 7. Fluids/Electrolytes/Nutrition: Monitor I/O. I personally reviewed the patient's labs today.  8. T2DM: Will monitor BS ac/hs. Continue lantus at bedtime with trajenta in am. Will use SSI for elevated BS.  9. CAD: Continue lopressor and lipitor.  10. Wheezing: denies increase in chronic SOB.  CXR without acute disease. I independently reviewed the patient's images today. . 12. CKD with BLE edema: Continue lasix bid. Cr remains at 3.   -consider additional diuretic to rx edema, fluid overload---check daily weights 13. Abdominal pain: KUB negative. Continue to work on regular bowel schedule.  14. Urinary retention:ua neg, cx pendingI. Monitor PVRs and cath for volumes > 350cc  15. Acute on chronic anemia due to CKD: has had iron and epo? injections in the past.  16. H/o gout: off colchicine  17. Thrombocytopenia: Cont to monitor 18. Hyponatremia: Cont to monitor 19. AFib: Monitor HR with increased activity 20. HTN: Monitor with increased activity LOS (Days) 1 A FACE TO  FACE EVALUATION WAS PERFORMED  Jayna Mulnix T 12/05/2015 9:14 AM

## 2015-12-05 NOTE — Patient Care Conference (Signed)
Inpatient RehabilitationTeam Conference and Plan of Care Update Date: 12/05/2015   Time: 2:10 PM    Patient Name: [name]      Medical Record Number: [id number]  Date of Birth: 03/27/1941 Sex: Female         Room/Bed: 4W22C/4W22C-01 Payor Info: Payor: MEDICARE / Plan: MEDICARE PART A AND B / Product Type: *No Product type* /    Admitting Diagnosis: Lumbar Microdiscectomy  Admit Date/Time:  12/04/2015  4:45 PM Admission Comments: No comment available   Primary Diagnosis:  <principal problem not specified> Principal Problem: <principal problem not specified>  Patient Active Problem List   Diagnosis Date Noted  . Lumbar back pain with radiculopathy affecting left lower extremity 12/04/2015  . Bilateral calf pain   . Pain   . Wheezing   . Abnormality of gait   . Radiculopathy of lumbosacral region   . Post-operative pain   . Type 2 diabetes mellitus with peripheral neuropathy (HCC)   . Coronary artery disease involving native coronary artery of native heart without angina pectoris   . CKD (chronic kidney disease)   . AP (abdominal pain)   . Urinary retention   . Anemia of chronic disease   . Acute blood loss anemia   . Hx of gout   . Thrombocytopenia ([place])   . Hyponatremia   . Persistent atrial fibrillation ([place])   . Benign essential HTN   . S/P lumbar discectomy 12/01/2015  . 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 Acute delirium   . Acute encephalopathy   . Chronic kidney disease (CKD), stage V ([place]) 07/13/2015  . Stroke (cerebrum) ([place]) 07/13/2015  . Hypertension   . Glaucoma   . GERD (gastroesophageal reflux disease)   . Atrial fibrillation ([place])   . CAD (coronary artery disease)   . Depression   . Carotid stenosis   . Stroke ([place])   . Diabetes mellitus without complication ([place])   . Gastroesophageal reflux disease with esophagitis   . Carotid artery stenosis 04/21/2015  .  Coronary arteriosclerosis 06/08/2014  . Essential hypertension 06/08/2014  . Hyperlipidemia 06/08/2014  . End stage renal disease ([place]) 05/18/2014  . Occlusion and stenosis of carotid artery without mention of cerebral infarction 01/01/2012    Expected Discharge Date: Expected Discharge Date: 12/15/15  Team Members Present: Physician leading conference: Dr. Alger Simons Social Worker Present: Lennart Pall, LCSW Nurse Present: Heather Roberts, RN PT Present: Canary Brim, PT OT Present: Napoleon Form, OT SLP Present: Weston Anna, SLP PPS Coordinator present : Daiva Nakayama, RN, CRRN     Current Status/Progress Goal Weekly Team Focus  Medical   lumbar spine pain/radiculopathy s/p discectomy, pain issues, anemic  improve activity tolerance  wound care, pain mgt, electrolyte mgt,    Bowel/Bladder   cont x2; LBM 5/22  Remain cont x2 while on Rehab  Continue toileting patient with appropriate devices   Swallow/Nutrition/ Hydration             ADL's   Min - Mod A with B & D, Mod A transfers  Overall Supervision for B & D using AE, Mod I for toileting  Adherence to back precautions, transfers, AE training, pain management, family ed   Mobility   Mod-max A overall  Supervision  pain/anxiety management, activity tolerance, transfers, gait and balance   Communication             Safety/Cognition/ Behavioral Observations  Pain   scheduled tramadol, PRN oxy  < 4 on 0-10 pain scale  Assess and treat pain as patient begins therapy   Skin   Surgical site with steris and foam  No new skin infection/breakdown while on Rehab  Continue to assess skin qshift    Rehab Goals Patient on target to meet rehab goals: Yes *See Care Plan and progress notes for long and short-term goals.  Barriers to Discharge: pain, anemia, activity tolerance    Possible Resolutions to Barriers:  mgt of anemia, pain mgt, strength/activity training    Discharge Planning/Teaching Needs:  Plan home with  spouse who can provide 24/7 assistance  TBD   Team Discussion:  New eval.  Pain and anxiety are issues.  Mostly mod assist on eval and setting supervision to min assist goals.  Revisions to Treatment Plan:  None   Continued Need for Acute Rehabilitation Level of Care: The patient requires daily medical management by a physician with specialized training in physical medicine and rehabilitation for the following conditions: Daily direction of a multidisciplinary physical rehabilitation program to ensure safe treatment while eliciting the highest outcome that is of practical value to the patient.: Yes Daily medical management of patient stability for increased activity during participation in an intensive rehabilitation regime.: Yes Daily analysis of laboratory values and/or radiology reports with any subsequent need for medication adjustment of medical intervention for : Post surgical problems;Neurological problems;Wound care problems;Renal problems  Chanler Schreiter 12/05/2015, 3:58 PM

## 2015-12-05 NOTE — Progress Notes (Signed)
Retta Diones, RN Rehab Admission Coordinator Signed Physical Medicine and Rehabilitation PMR Pre-admission 12/04/2015 1:23 PM  Related encounter: Admission (Discharged) from 12/01/2015 in [place] [facility]   PMR Admission Coordinator Pre-Admission Assessment  Patient: [name] is an 75 y.o., female MRN: [id number] DOB: Nov 13, 1940 Height:   Weight: 86.183 kg (190 lb)  Insurance Information HMO: No PPO: PCP: IPA: 80/20: OTHER:  PRIMARY: Medicare A/B Policy#: [id number] Subscriber: [name] CM Name: Phone#: Fax#:  Pre-Cert#: Employer: Retired Benefits: Phone #: Name: Checked in [place]. Date: 11/12/04 Deduct: $1316 Out of Pocket Max: none Life Max: unlimited CIR: 100% SNF: 100 days Outpatient: 80% Co-Pay: 20% Home Health: 100% Co-Pay: none DME: 80% Co-Pay: 20% Providers: patient's choice  SECONDARY: BCBS supplement Policy#: [id number] Subscriber: [name] CM Name: Phone#: Fax#:  Pre-Cert#: Employer: REtired Benefits: Phone #: [phone] Name:  Eff. Date: Deduct: Out of Pocket Max: Life Max:  CIR: SNF:  Outpatient: Co-Pay:  Home Health: Co-Pay:  DME: Co-Pay:   Emergency Contact Information Contact Information    Name Relation Home Work [place] F Spouse [phone]     Tilley,Mimmi Daughter [phone]       Current Medical History  Patient Admitting Diagnosis: Bilateral herniation of L5-S1 with radiculopathy   History of Present Illness: A 75 y.o. right handed female with history of glaucoma, hypertension, atrial  fibrillation maintained on aspirin 325 mg daily, CAD status post CABG, diabetes mellitus and peripheral neuropathy, chronic renal insufficiency with baseline creatinine 2.74-3.07 and TIA. Per chart review patient lives with spouse. Independent with a cane prior to admission with noted frequent falls. Two-level home with bedroom on first floor. Presented 12/01/2015 with persistent low back pain radiating to left lower extremity. MRI imaging showed L5-S1 circumferential disc bulging, disc space height loss and moderate facet arthrosis with radiculopathy, moderate bilateral neuroforaminal stenosis. Also L4-S5 mild disc bulge and mild facet hypertrophy. Patient has failed conservative treatment. Underwent bilateral L5-S1 microdiscectomy 12/01/2015 per Dr. Lorin Mercy. Hospital course pain management. Acute blood loss anemia 8.4 and monitored. Creatinine currently 3.00. Bilateral LE dopplers done 12/04/15. Dopplers negative for DVT. Physical therapy evaluation completed and ongoing with recommendations of physical medicine rehabilitation consult.   Past Medical History  Past Medical History  Diagnosis Date  . Anemia   . Glaucoma   . Hypertension   . Blood transfusion   . GERD (gastroesophageal reflux disease)   . Arthritis   . Neuromuscular disorder ([place])     CARPEL TUNNEL  . Atrial fibrillation ([place])   . CAD (coronary artery disease)   . Carotid artery occlusion     Carotid Endartectom,y - left 2009. Blockage Right being watched by Dr Scot Dock.  Marland Kitchen HOH (hard of hearing)   . Shortness of breath   . Depression   . History of kidney stones     passed  . Carotid stenosis   . Complication of anesthesia     pt. states that she was difficult to wake  . Stroke ([place])     hx of TIA  . 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 Cancer ([place])     . top of head- melonoma  . Dysrhythmia   . History of  pneumonia   . Diabetes mellitus without complication ([place])   . Myocardial infarction ([place])   . History of hiatal hernia   . Pneumonia     Family  History  family history includes Cancer in her father; Deep vein thrombosis in her daughter; Diabetes in her daughter and mother; Heart attack in her mother; Heart disease in her daughter and mother; Hyperlipidemia in her daughter and father; Hypertension in her daughter, father, and mother; Peripheral vascular disease in her daughter; Stroke in her mother.  Prior Rehab/Hospitalizations: Had HHPT about 8 months ago for her back  Has the patient had major surgery during 100 days prior to admission? No  Current Medications   Current facility-administered medications:  . 0.45 % sodium chloride infusion, , Intravenous, Continuous, Lanae Crumbly, PA-C, Last Rate: 75 mL/hr at 12/02/15 1109 . 0.9 % sodium chloride infusion, , Intravenous, Continuous, Catalina Gravel, MD, Last Rate: 20 mL/hr at 12/01/15 0917 . 0.9 % sodium chloride infusion, 250 mL, Intravenous, Continuous, Lanae Crumbly, PA-C, 250 mL at 12/01/15 1527 . allopurinol (ZYLOPRIM) tablet 100 mg, 100 mg, Oral, BID, Lanae Crumbly, PA-C, Stopped at 12/04/15 1000 . amLODipine (NORVASC) tablet 10 mg, 10 mg, Oral, Daily, Lanae Crumbly, PA-C, 10 mg at 12/04/15 0744 . atorvastatin (LIPITOR) tablet 10 mg, 10 mg, Oral, Daily, Lanae Crumbly, PA-C, 10 mg at 12/04/15 [id number] . brimonidine (ALPHAGAN) 0.15 % ophthalmic solution 1 drop, 1 drop, Both Eyes, BID, Lanae Crumbly, PA-C, 1 drop at 12/04/15 0758 . calcitRIOL (ROCALTROL) capsule 0.25 mcg, 0.25 mcg, Oral, QODAY, Lanae Crumbly, PA-C, 0.25 mcg at 12/04/15 0744 . citalopram (CELEXA) tablet 20 mg, 20 mg, Oral, Daily, Lanae Crumbly, PA-C, 20 mg at 12/04/15 0743 . docusate sodium (COLACE) capsule 100 mg, 100 mg, Oral, BID, Lanae Crumbly, PA-C, 100 mg at 12/04/15 [id number] . furosemide (LASIX) tablet 80 mg, 80 mg, Oral, BID, Lanae Crumbly, PA-C, 80 mg at 12/04/15 0743 . HYDROcodone-acetaminophen (NORCO) 10-325 MG per tablet 1 tablet, 1 tablet, Oral, Q6H PRN, Lanae Crumbly, PA-C, 1 tablet at 12/04/15 1205 . insulin aspart (novoLOG) injection 0-15 Units, 0-15 Units, Subcutaneous, TID WC, Lanae Crumbly, PA-C, 3 Units at 12/04/15 [phone] . insulin glargine (LANTUS) injection 20 Units, 20 Units, Subcutaneous, QHS, Lanae Crumbly, PA-C, 20 Units at 12/03/15 2202 . latanoprost (XALATAN) 0.005 % ophthalmic solution 1 drop, 1 drop, Left Eye, QHS, Lanae Crumbly, PA-C, 1 drop at 12/03/15 2036 . linagliptin (TRADJENTA) tablet 5 mg, 5 mg, Oral, Q breakfast, Lanae Crumbly, PA-C, 5 mg at 12/04/15 0743 . menthol-cetylpyridinium (CEPACOL) lozenge 3 mg, 1 lozenge, Oral, PRN **OR** phenol (CHLORASEPTIC) mouth spray 1 spray, 1 spray, Mouth/Throat, PRN, Lanae Crumbly, PA-C . methocarbamol (ROBAXIN) tablet 500 mg, 500 mg, Oral, Q6H PRN, 500 mg at 12/04/15 0237 **OR** methocarbamol (ROBAXIN) 500 mg in dextrose 5 % 50 mL IVPB, 500 mg, Intravenous, Q6H PRN, Lanae Crumbly, PA-C . metoprolol (LOPRESSOR) tablet 50 mg, 50 mg, Oral, BID, Lanae Crumbly, PA-C, 50 mg at 12/04/15 0741 . ondansetron [facility]) injection 4 mg, 4 mg, Intravenous, Q4H PRN, Lanae Crumbly, PA-C, 4 mg at 12/04/15 [id number] . pantoprazole (PROTONIX) EC tablet 40 mg, 40 mg, Oral, Daily, Lanae Crumbly, PA-C, 40 mg at 12/04/15 0743 . polyethylene glycol (MIRALAX / GLYCOLAX) packet 17 g, 17 g, Oral, Daily PRN, Lanae Crumbly, PA-C, 17 g at 12/03/15 0957 . sodium chloride flush (NS) 0.9 % injection 3 mL, 3 mL, Intravenous, Q12H, Lanae Crumbly, PA-C, 3 mL at 12/04/15 0748 . sodium chloride flush (NS) 0.9 % injection 3 mL, 3 mL, Intravenous, PRN, Lanae Crumbly, PA-C .  timolol (TIMOPTIC) 0.5 % ophthalmic solution 1 drop, 1 drop, Left Eye, Daily, Lanae Crumbly, PA-C, 1 drop at 12/04/15 [id number]  Patients Current Diet: Diet Carb Modified Fluid consistency:: Thin; Room service appropriate?:  Yes Diet - low sodium heart healthy Diet - low sodium heart healthy  Precautions / Restrictions Precautions Precautions: Back, Fall Precaution Booklet Issued: No Precaution Comments: Pt unable to recall any back precautions due to being preoccupied with significant pain. Restrictions Weight Bearing Restrictions: No   Has the patient had 2 or more falls or a fall with injury in the past year?Yes. Has had 2 falls with no injury.  Prior Activity Level Limited Community (1-2x/wk): Went out 1-2 X a week  Development worker, international aid / Equipment Home Equipment: Environmental consultant - 4 wheels, Environmental consultant - 2 wheels, [place] - single point, Bedside commode, Grab bars - toilet  Prior Device Use: Indicate devices/aids used by the patient prior to current illness, exacerbation or injury? Walker and cane. Has mostly been using a cane, but has a walker.  Prior Functional Level Prior Function Level of Independence: Needs assistance Gait / Transfers Assistance Needed: frequent falls due to imbalance; only uses cane when goes out; uses no device inside ADL's / Homemaking Assistance Needed: Daughter assists with dressing and IADLs  Self Care: Did the patient need help bathing, dressing, using the toilet or eating? Independent  Indoor Mobility: Did the patient need assistance with walking from room to room (with or without device)? Independent  Stairs: Did the patient need assistance with internal or external stairs (with or without device)? Independent  Functional Cognition: Did the patient need help planning regular tasks such as shopping or remembering to take medications? Independent  Current Functional Level Cognition  Overall Cognitive Status: Within Functional Limits for tasks assessed Orientation Level: Oriented X4   Extremity Assessment (includes Sensation/Coordination)  Upper Extremity Assessment: Overall WFL for tasks assessed  Lower Extremity Assessment: Generalized weakness    ADLs   Overall ADL's : Needs assistance/impaired Grooming: Wash/dry hands, Sitting, Set up Upper Body Bathing: Moderate assistance, Sitting Lower Body Bathing: Maximal assistance, Sit to/from stand Lower Body Bathing Details (indicate cue type and reason): pt. states she is unable to cross legs over knees at this time, states her spouse will be able to assist at home with LB ADLS Upper Body Dressing : Moderate assistance, Sitting Lower Body Dressing: Maximal assistance, Sit to/from stand Lower Body Dressing Details (indicate cue type and reason): pt. states she is unable to cross legs over knees at this time, states her spouse will be able to assist at home with LB ADLS Toilet Transfer: Minimal assistance, Ambulation, BSC, RW, Cueing for safety Toilet Transfer Details (indicate cue type and reason): Heavy min assist for boost to stand and for balance. Pt only able to ambulate 4' and then requested BSC be brought to her due to increased pain. Toileting- Clothing Manipulation and Hygiene: Minimal assistance, Sit to/from stand, Cueing for safety Toileting - Clothing Manipulation Details (indicate cue type and reason): Cues for safe hand placement and balance. Assist for sit-stand and balance during pericare Functional mobility during ADLs: Minimal assistance, Rolling walker, Cueing for safety General ADL Comments: Pt required increased physical assistance this session and husband reports he and his daughter are unable to provide this level of assist at this time. Pt and husband agree that pt would not be safe to d/c home at this time and are agreeable to SNF for continued rehab.    Mobility  Overal bed  mobility: Needs Assistance Bed Mobility: Rolling, Sit to Sidelying Rolling: Min assist Sit to sidelying: Mod assist General bed mobility comments: HOB flat, heavy use of bedrails. Assist to move BLE onto bed and to position hips using bed pad. Verbal cues for proper log roll technique throughout however  pt with poor return demonstration due to pain    Transfers  Overall transfer level: Needs assistance Equipment used: Rolling walker (2 wheeled) Transfers: Sit to/from Stand, Stand Pivot Transfers Sit to Stand: Min assist Stand pivot transfers: Min assist General transfer comment: Min assist for boost to stand, balance upon standing and balance during transfers. Verbal cues for safety, safe use of DME, and safe hand placement throughout transfers.    Ambulation / Gait / Stairs / Wheelchair Mobility  Ambulation/Gait Ambulation/Gait assistance: Museum/gallery curator (Feet): 5 Feet (x 2) Assistive device: Rolling walker (2 wheeled) Gait Pattern/deviations: Step-to pattern, Decreased stride length General Gait Details: Pt. with limited tolerance to gait due to increasing pain in low back. Needs assist for stability/safety Gait velocity: decreased    Posture / Balance Balance Overall balance assessment: Needs assistance Sitting-balance support: No upper extremity supported, Feet supported Sitting balance-Leahy Scale: Fair Standing balance support: Bilateral upper extremity supported, During functional activity Standing balance-Leahy Scale: Poor Standing balance comment: Heavy reliance on RW for UE support to maintain balance    Special needs/care consideration BiPAP/CPAP No CPM No Continuous Drip IV No Dialysis No  Life Vest No Oxygen No Special Bed No Trach Size No Wound Vac (area) No  Skin Has a lumbar incision with dressing  Bowel mgmt: Last BM 11/30/15 Bladder mgmt: Urinary retention post op Diabetic mgmt Yes, on both oral medications and insulin at home    Previous Home Environment Living Arrangements: Spouse/significant other Available Help at Discharge: Family, Available 24 hours/day (husband and daughter) Type of Home: House Home Layout: Two level, Able to live on main level with bedroom/bathroom (basement is  main level (no stairs to enter)) Alternate Level Stairs-Number of Steps: flight Home Access: Level entry Bathroom Shower/Tub: Gaffer, Door [employer] Toilet: Handicapped height Bathroom Accessibility: Yes Additional Comments: Daughter lives in a house behind pt  Discharge Living Setting Plans for Discharge Living Setting: Patient's home, House, Lives with (comment) (Lives with husband.) Type of Home at Discharge: House Discharge Home Layout: Two level, Able to live on main level with bedroom/bathroom Alternate Level Stairs-Number of Steps: Flight Discharge Home Access: Level entry  Social/Family/Support Systems Patient Roles: Spouse, Parent, Other (Comment) (Has husband, daughter, 2 granddaughters.) Contact Information: [name] - spouse Anticipated Caregiver: Husband, daughter Anticipated Caregiver's Contact Information: Jeneen Rinks - husband - [phone] Ability/Limitations of Caregiver: Husband can assist. Dtr and granddaughters live close by. Caregiver Availability: 24/7 Discharge Plan Discussed with Primary Caregiver: Yes Is Caregiver In Agreement with Plan?: Yes Does Caregiver/Family have Issues with Lodging/Transportation while Pt is in Rehab?: No  Goals/Additional Needs Patient/Family Goal for Rehab: PT/OT mod I and supervision goals Expected length of stay: 7-11 days Cultural Considerations: [facility] Dietary Needs: Carb mod, med cal, thin liquids Equipment Needs: TBD Pt/Family Agrees to Admission and willing to participate: Yes Program Orientation Provided & Reviewed with Pt/Caregiver Including Roles & Responsibilities: Yes  Decrease burden of Care through IP rehab admission: N/A  Possible need for SNF placement upon discharge: Not planned  Patient Condition: This patient's condition remains as documented in the consult dated 12/04/15, in which the Rehabilitation Physician determined and documented that the patient's condition is appropriate for intensive  rehabilitative  care in an inpatient rehabilitation facility. Will admit to inpatient rehab today.  Preadmission Screen Completed By: Retta Diones, 12/04/2015 2:38 PM ______________________________________________________________________  Discussed status with Dr. Posey Pronto on 12/04/15 at 1438 and received telephone approval for admission today.  Admission Coordinator: Retta Diones, time1438/Date05/22/17          Cosigned by: Ankit Lorie Phenix, MD at 12/04/2015 2:46 PM  Revision History     Date/Time User Provider Type Action   12/04/2015 2:46 PM Ankit Lorie Phenix, MD Physician Cosign   12/04/2015 2:38 PM Retta Diones, RN Rehab Admission Coordinator Sign

## 2015-12-05 NOTE — Progress Notes (Signed)
Ankit Lorie Phenix, MD Physician Signed Physical Medicine and Rehabilitation Consult Note 12/04/2015 6:52 AM  Related encounter: Admission (Discharged) from 12/01/2015 in [place] Collapse [facility]        Physical Medicine and Rehabilitation Consult Reason for Consult: Bilateral herniation of L5-S1 with radiculopathy Referring Physician: Dr. Lorin Mercy   HPI: [name] is a 75 y.o. right handed female with history of glaucoma, hypertension, atrial fibrillation maintained on aspirin 325 mg daily, CAD status post CABG, diabetes mellitus and peripheral neuropathy, chronic renal insufficiency with baseline creatinine 2.74-3.07 and TIA. Per chart review patient lives with spouse. Independent with a cane prior to admission with noted frequent falls. Two-level home with bedroom on first floor. Presented 12/01/2015 with persistent low back pain radiating to left lower extremity. MRI imaging showed L5-S1 circumferential disc bulging, disc space height loss and moderate facet arthrosis with radiculopathy, moderate bilateral neuroforaminal stenosis. Also L4-S5 mild disc bulge and mild facet hypertrophy. Patient has failed conservative treatment. Underwent bilateral L5-S1 microdiscectomy 12/01/2015 per Dr. Lorin Mercy. Hospital course pain management. Acute blood loss anemia 8.4 and monitored. Creatinine currently 3.00. Physical therapy evaluation completed and ongoing with recommendations of physical medicine rehabilitation consult.   Review of Systems  Constitutional: Negative for fever and chills.  HENT: Positive for hearing loss.  Eyes: Negative for blurred vision and double vision.  Respiratory: Negative for cough.   Shortness of breath on exertion  Cardiovascular: Positive for leg swelling. Negative for chest pain.  Gastrointestinal: Positive for constipation. Negative for nausea and vomiting.   GERD  Musculoskeletal: Positive for myalgias,  back pain and falls.  Skin: Negative for rash.  Neurological: Positive for weakness. Negative for seizures and headaches.  Psychiatric/Behavioral: Positive for depression.  All other systems reviewed and are negative.  Past Medical History  Diagnosis Date  . Anemia   . Glaucoma   . Hypertension   . Blood transfusion   . GERD (gastroesophageal reflux disease)   . Arthritis   . Neuromuscular disorder ([place])     CARPEL TUNNEL  . Atrial fibrillation ([place])   . CAD (coronary artery disease)   . Carotid artery occlusion     Carotid Endartectom,y - left 2009. Blockage Right being watched by Dr Scot Dock.  Marland Kitchen HOH (hard of hearing)   . Shortness of breath   . Depression   . History of kidney stones     passed  . Carotid stenosis   . Complication of anesthesia     pt. states that she was difficult to wake  . Stroke ([place])     hx of TIA  . 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 Cancer ([place])     . top of head- melonoma  . Dysrhythmia   . History of pneumonia   . Diabetes mellitus without complication ([place])   . Myocardial infarction ([place])   . History of hiatal hernia   . Pneumonia    Past Surgical History  Procedure Laterality Date  . Carpel tunnel    . Neck surgery    . Esophagogastroduodenoscopy  07/25/2011    Procedure: ESOPHAGOGASTRODUODENOSCOPY (EGD); Surgeon: Winfield Cunas., MD; Location: [facility] ENDOSCOPY; Service: Endoscopy; Laterality: N/A;  . Colonoscopy  07/25/2011    Procedure: COLONOSCOPY; Surgeon: Winfield Cunas., MD; Location[facility] ENDOSCOPY; Service: Endoscopy; Laterality: N/A;  . Coronary artery bypass graft  07/29/2011    Procedure: CORONARY ARTERY BYPASS GRAFTING (CABG);  Surgeon: Gaye Pollack, MD; Location: [place]; Service: Open Heart Surgery; Laterality: N/A;  . Carotid endarterectomy Left 2009     CEA  . Av fistula placement Left 05/31/2014    Procedure: Creation of Left Arm arteriovenous brachiocephalic Fistula; Surgeon: Angelia Mould, MD; Location: [place]; Service: Vascular; Laterality: Left;  . Fistulogram Left 08/29/2014    Procedure: FISTULOGRAM; Surgeon: Angelia Mould, MD; Location: [facility] CATH LAB; Service: Cardiovascular; Laterality: Left;  . Av fistula placement Left 09/01/2014    Procedure: INSERTION OF ARTERIOVENOUS (AV) GORE-TEX GRAFT LEFT UPPER ARM USING 4-7 MM X 45 CM SRTETCH GORETEX GRAFT; Surgeon: Angelia Mould, MD; Location: [place]; Service: Vascular; Laterality: Left;  . Cardiac catheterization    . Tonsillectomy    . Eye surgery    . Endarterectomy Right 04/21/2015    Procedure: ENDARTERECTOMY CAROTID; Surgeon: Angelia Mould, MD; [facility]; Service: Vascular; Laterality: Right;  . Knee surgery Left    Family History  Problem Relation Age of Onset  . Diabetes Mother   . Hypertension Mother   . Heart disease Mother     beofre age 27  . Heart attack Mother   . Stroke Mother   . Cancer Father   . Hyperlipidemia Father   . Hypertension Father   . Deep vein thrombosis Daughter   . Diabetes Daughter   . Hyperlipidemia Daughter   . Hypertension Daughter   . Heart disease Daughter   . Peripheral vascular disease Daughter    Social History:  reports that she has never smoked. She has never used smokeless tobacco. She reports that she does not drink alcohol or use illicit drugs. Allergies:  Allergies  Allergen Reactions  . Gabapentin Other (See Comments)    hallucinations  . Amlodipine     Other reaction(s): edema  . Other     Other reaction(s): Fatigue, Gout, Hallucintions  . Sulfa Antibiotics     Other reaction(s): psychiatric disturbances   Medications Prior to Admission    Medication Sig Dispense Refill  . acetaminophen (TYLENOL) 500 MG tablet Take 500 mg by mouth every 6 (six) hours as needed. For pain    . allopurinol (ZYLOPRIM) 100 MG tablet Take 100 mg by mouth 2 (two) times daily.     Marland Kitchen amLODipine (NORVASC) 10 MG tablet TAKE ONE TABLET BY MOUTH ONE TIME DAILY 30 tablet 9  . aspirin 325 MG tablet Take 325 mg by mouth daily.    Marland Kitchen atorvastatin (LIPITOR) 10 MG tablet TAKE ONE TABLET BY MOUTH ONE TIME DAILY (Patient taking differently: Take 10 mg by mouth daily. ) 30 tablet 0  . B Complex Vitamins (VITAMIN B COMPLEX PO) Take 1 tablet by mouth daily.    . brimonidine (ALPHAGAN P) 0.1 % SOLN Place 1 drop into the left eye 2 (two) times daily.    . calcitRIOL (ROCALTROL) 0.25 MCG capsule Take 0.25 mcg by mouth every other day.    . Cholecalciferol (VITAMIN D) 2000 UNITS tablet Take 2,000 Units by mouth 2 (two) times daily.    . citalopram (CELEXA) 20 MG tablet Take 20 mg by mouth daily.    . colchicine 0.6 MG tablet Take 0.6 mg by mouth 2 (two) times daily as needed. gout    . Cyanocobalamin (VITAMIN B 12 PO) Take 1,000 mg by mouth.    . fish oil-omega-3 fatty acids 1000 MG capsule Take 2 g by mouth daily.    . furosemide (LASIX) 40 MG tablet Take 80  mg by mouth 2 (two) times daily.     Marland Kitchen HUMALOG 100 UNIT/ML injection Inject 0-12 Units into the skin 3 (three) times daily with meals. Sliding scale    . insulin glargine (LANTUS) 100 UNIT/ML injection Inject 20-30 Units into the skin at bedtime.     Marland Kitchen linagliptin (TRADJENTA) 5 MG TABS tablet Take 1 tablet (5 mg total) by mouth daily with breakfast. 30 tablet 1  . metoprolol (LOPRESSOR) 50 MG tablet Take 50 mg by mouth 2 (two) times daily.    Marland Kitchen omeprazole (PRILOSEC) 20 MG capsule Take 20 mg by mouth daily as needed. Acid reflux    . oxycodone (OXY-IR) 5 MG capsule Take 5 mg by mouth every 6 (six) hours as needed for  pain.    Marland Kitchen timolol (TIMOPTIC) 0.5 % ophthalmic solution Place 1 drop into the left eye daily.    . travoprost, benzalkonium, (TRAVATAN) 0.004 % ophthalmic solution Place 1 drop into the left eye at bedtime.      Home: Home Living Family/patient expects to be discharged to:: Private residence Living Arrangements: Spouse/significant other Available Help at Discharge: Family, Available 24 hours/day (husband and daughter) Type of Home: House Home Access: Level entry Home Layout: Two level, Able to live on main level with bedroom/bathroom (basement is main level (no stairs to enter)) Alternate Level Stairs-Number of Steps: flight Bathroom Shower/Tub: Gaffer, Door [employer] Toilet: Handicapped height Bathroom Accessibility: Yes Home Equipment: Environmental consultant - 4 wheels, Environmental consultant - 2 wheels, [place] - single point, Bedside commode, Grab bars - toilet Additional Comments: Daughter lives in a house behind pt  Functional History: Prior Function Level of Independence: Needs assistance Gait / Transfers Assistance Needed: frequent falls due to imbalance; only uses cane when goes out; uses no device inside ADL's / Homemaking Assistance Needed: Daughter assists with dressing and IADLs Functional Status:  Mobility: Bed Mobility Overal bed mobility: Needs Assistance Bed Mobility: Rolling, Sidelying to Sit General bed mobility comments: sitting in recliner upon arrival Transfers Overall transfer level: Needs assistance Equipment used: Rolling walker (2 wheeled) Transfers: Sit to/from Stand Sit to Stand: Min guard Stand pivot transfers: Min guard General transfer comment: min guard for safety Ambulation/Gait Ambulation/Gait assistance: Min assist Ambulation Distance (Feet): 5 Feet (x 2) Assistive device: Rolling walker (2 wheeled) Gait Pattern/deviations: Step-to pattern, Decreased stride length General Gait Details: Pt. with limited tolerance to gait due to increasing pain in low  back. Needs assist for stability/safety Gait velocity: decreased    ADL: ADL Overall ADL's : Needs assistance/impaired Grooming: Wash/dry hands, Sitting, Set up Upper Body Bathing: Minimal assitance, Sitting Lower Body Bathing: Maximal assistance, Sit to/from stand Lower Body Bathing Details (indicate cue type and reason): pt. states she is unable to cross legs over knees at this time, states her spouse will be able to assist at home with LB ADLS Upper Body Dressing : Minimal assistance, Sitting Lower Body Dressing: Maximal assistance, Sit to/from stand Lower Body Dressing Details (indicate cue type and reason): pt. states she is unable to cross legs over knees at this time, states her spouse will be able to assist at home with LB ADLS Toilet Transfer: Min guard, RW, Ambulation Toilet Transfer Details (indicate cue type and reason): simulated with in room transfer from eob, then ambulation to recliner (declined b.room stating she had just finished using the b.room) Toileting- Clothing Manipulation and Hygiene: Minimal assistance, Sit to/from stand Toileting - Clothing Manipulation Details (indicate cue type and reason): simulated during transfer Functional mobility during  ADLs: Min guard, Cueing for sequencing, Rolling walker General ADL Comments: reviewed back precautions, a little more difficult this session as pt. reports her family took her hearing aides home with them last night so therapist asst. had to speak very loud and provide demonstration cues for instructions and back precautions  Cognition: Cognition Overall Cognitive Status: Within Functional Limits for tasks assessed Orientation Level: Oriented X4 Cognition Arousal/Alertness: Awake/alert Behavior During Therapy: WFL for tasks assessed/performed Overall Cognitive Status: Within Functional Limits for tasks assessed  Blood pressure 130/70, pulse 77, temperature 99.4 F (37.4 C), temperature source Oral, resp. rate 18,  weight 86.183 kg (190 lb), SpO2 95 %. Physical Exam  Vitals reviewed. Constitutional: She is oriented to person, place, and time. She appears well-developed and well-nourished.  HENT:  Head: Normocephalic and atraumatic.  Eyes: Conjunctivae and EOM are normal.  Neck: Normal range of motion. Neck supple. No thyromegaly present.  Cardiovascular: Normal rate and regular rhythm.  Respiratory: Effort normal and breath sounds normal.  GI: Soft. Bowel sounds are normal. She exhibits no distension.  Musculoskeletal: She exhibits edema. She exhibits no tenderness.  Neurological: She is alert and oriented to person, place, and time.  Patient is very hard of hearing.  Follows simple commands. DTRs symmetric Motor: B/l UE 4+/5 proximal to distal B/l LE: Hip flexion, knee extension 4-/5, ankle dorsi/plantar flexion 4/5  Skin: Skin is warm and dry.  Back incision clean and dry with dressing in place  Psychiatric: She has a normal mood and affect. Her behavior is normal.     Lab Results Last 24 Hours    Results for orders placed or performed during the hospital encounter of 12/01/15 (from the past 24 hour(s))  Glucose, capillary Status: Abnormal   Collection Time: 12/03/15 7:12 AM  Result Value Ref Range   Glucose-Capillary 172 (H) 65 - 99 mg/dL  Glucose, capillary Status: Abnormal   Collection Time: 12/03/15 11:26 AM  Result Value Ref Range   Glucose-Capillary 210 (H) 65 - 99 mg/dL  Glucose, capillary Status: Abnormal   Collection Time: 12/03/15 4:24 PM  Result Value Ref Range   Glucose-Capillary 187 (H) 65 - 99 mg/dL   Comment 1 Notify RN   Glucose, capillary Status: Abnormal   Collection Time: 12/03/15 9:19 PM  Result Value Ref Range   Glucose-Capillary 201 (H) 65 - 99 mg/dL  CBC Status: Abnormal   Collection Time: 12/04/15 3:16 AM  Result Value Ref Range   WBC 9.1 4.0 - 10.5 K/uL   RBC 2.73 (L)  3.87 - 5.11 MIL/uL   Hemoglobin 8.4 (L) 12.0 - 15.0 g/dL   HCT 25.9 (L) 36.0 - 46.0 %   MCV 94.9 78.0 - 100.0 fL   MCH 30.8 26.0 - 34.0 pg   MCHC 32.4 30.0 - 36.0 g/dL   RDW 15.5 11.5 - 15.5 %   Platelets 133 (L) 150 - 400 K/uL  Basic Metabolic Panel Status: Abnormal   Collection Time: 12/04/15 3:16 AM  Result Value Ref Range   Sodium 134 (L) 135 - 145 mmol/L   Potassium 3.6 3.5 - 5.1 mmol/L   Chloride 102 101 - 111 mmol/L   CO2 21 (L) 22 - 32 mmol/L   Glucose, Bld 167 (H) 65 - 99 mg/dL   BUN 66 (H) 6 - 20 mg/dL   Creatinine, Ser 3.00 (H) 0.44 - 1.00 mg/dL   Calcium 8.5 (L) 8.9 - 10.3 mg/dL   GFR calc non Af Amer 14 (L) >60 mL/min  GFR calc Af Amer 16 (L) >60 mL/min   Anion gap 11 5 - 15  Glucose, capillary Status: Abnormal   Collection Time: 12/04/15 6:14 AM  Result Value Ref Range   Glucose-Capillary 155 (H) 65 - 99 mg/dL      Imaging Results (Last 48 hours)    No results found.    Assessment/Plan: Diagnosis: Bilateral herniation of L5-S1 with radiculopathy Labs and images independently reviewed. Records reviewed and summated above.  1. Does the need for close, 24 hr/day medical supervision in concert with the patient's rehab needs make it unreasonable for this patient to be served in a less intensive setting? Yes 2. Co-Morbidities requiring supervision/potential complications: glaucoma, HTN (monitor and provide prns in accordance with increased physical exertion and pain), atrial fibrillation (cont meds, monitor with increased activity), CAD status post CABG (Monitor in accordance with increased physical activity and avoid UE resistance excercises), diabetes mellitus and peripheral neuropathy (Monitor in accordance with exercise and adjust meds as necessary), chronic renal insufficiency (avoid nephrotoxic meds), TIA (cont meds), pain management (Biofeedback training with  therapies to help reduce reliance on opiate pain medications, monitor pain control during therapies, and sedation at rest and titrate to maximum efficacy to ensure participation and gains in therapies), Acute blood loss anemia, hyponatremia (cont to monitor, treat if necessary), Thrombocytopenia (< 60,000/mm3 no resistive exercise) 3. Due to safety, skin/wound care, disease management, pain management and patient education, does the patient require 24 hr/day rehab nursing? Yes 4. Does the patient require coordinated care of a physician, rehab nurse, PT (1-2 hrs/day, 5 days/week) and OT (1-2 hrs/day, 5 days/week) to address physical and functional deficits in the context of the above medical diagnosis(es)? Yes Addressing deficits in the following areas: balance, endurance, locomotion, strength, transferring, dressing, toileting and psychosocial support 5. Can the patient actively participate in an intensive therapy program of at least 3 hrs of therapy per day at least 5 days per week? Potentially 6. The potential for patient to make measurable gains while on inpatient rehab is excellent 7. Anticipated functional outcomes upon discharge from inpatient rehab are modified independent and supervision with PT, modified independent and supervision with OT, n/a with SLP. 8. Estimated rehab length of stay to reach the above functional goals is: 7-11 days. 9. Does the patient have adequate social supports and living environment to accommodate these discharge functional goals? Yes 10. Anticipated D/C setting: Home 11. Anticipated post D/C treatments: HH therapy and Home excercise program 12. Overall Rehab/Functional Prognosis: excellent  RECOMMENDATIONS: This patient's condition is appropriate for continued rehabilitative care in the following setting: CIR Patient has agreed to participate in recommended program. Yes Note that insurance prior authorization may be required for reimbursement for recommended  care.  Comment: Rehab Admissions Coordinator to follow up.  Delice Lesch, MD 12/04/2015       Revision History     Date/Time User Provider Type Action   12/04/2015 12:39 PM Ankit Lorie Phenix, MD Physician Sign   12/04/2015 7:11 AM Cathlyn Parsons, PA-C Physician Assistant Pend   View Details Report       Routing History     Date/Time From To Method   12/04/2015 12:39 PM Ankit Lorie Phenix, MD Josetta Huddle, MD Fax

## 2015-12-05 NOTE — Care Management Important Message (Signed)
Important Message  Patient Details  Name: [name] MRN: [id number] Date of Birth: 1940-12-06   Medicare Important Message Given:  Yes    Riah Kehoe, Leroy Sea 12/05/2015, 1:26 PM

## 2015-12-05 NOTE — Progress Notes (Signed)
Patient information reviewed and entered into eRehab system by Wilkie Zenon, RN, CRRN, PPS Coordinator.  Information including medical coding and functional independence measure will be reviewed and updated through discharge.     Per nursing patient was given "Data Collection Information Summary for Patients in Inpatient Rehabilitation Facilities with attached "Privacy Act Statement-Health Care Records" upon admission.  

## 2015-12-05 NOTE — Progress Notes (Signed)
Nutrition Brief Note  Patient identified on the Malnutrition Screening Tool (MST) Report  Wt Readings from Last 15 Encounters:  12/01/15 190 lb (86.183 kg)  11/23/15 190 lb 3.2 oz (86.274 kg)  11/08/15 184 lb (83.462 kg)  09/21/15 189 lb 6.4 oz (85.911 kg)  08/31/15 191 lb (86.637 kg)  07/13/15 186 lb 8 oz (84.596 kg)  05/10/15 199 lb (90.266 kg)  04/21/15 199 lb 8.3 oz (90.5 kg)  04/19/15 185 lb (83.915 kg)  03/30/15 185 lb 3 oz (84 kg)  03/01/15 196 lb (88.905 kg)  09/14/14 200 lb (90.719 kg)  09/01/14 205 lb (92.987 kg)  08/29/14 205 lb (92.987 kg)  08/17/14 205 lb 14.4 oz (93.396 kg)  Weight has been stable per records.   Current diet order is carbohydrate modified, patient is consuming approximately 75-100% of meals at this time. Labs and medications reviewed.   No nutrition interventions warranted at this time. If nutrition issues arise, please consult RD.   Corrin Parker, MS, RD, LDN Pager # [phone] After hours/ weekend pager # [phone]

## 2015-12-06 ENCOUNTER — Inpatient Hospital Stay (HOSPITAL_COMMUNITY): Payer: Medicare Other | Admitting: Occupational Therapy

## 2015-12-06 ENCOUNTER — Inpatient Hospital Stay (HOSPITAL_COMMUNITY): Payer: Medicare Other | Admitting: Physical Therapy

## 2015-12-06 DIAGNOSIS — N189 Chronic kidney disease, unspecified: Secondary | ICD-10-CM

## 2015-12-06 DIAGNOSIS — R600 Localized edema: Secondary | ICD-10-CM

## 2015-12-06 LAB — CBC
HEMATOCRIT: 24.5 % — AB (ref 36.0–46.0)
Hemoglobin: 8 g/dL — ABNORMAL LOW (ref 12.0–15.0)
MCH: 31.3 pg (ref 26.0–34.0)
MCHC: 32.7 g/dL (ref 30.0–36.0)
MCV: 95.7 fL (ref 78.0–100.0)
PLATELETS: 154 10*3/uL (ref 150–400)
RBC: 2.56 MIL/uL — ABNORMAL LOW (ref 3.87–5.11)
RDW: 15.1 % (ref 11.5–15.5)
WBC: 7.9 10*3/uL (ref 4.0–10.5)

## 2015-12-06 LAB — GLUCOSE, CAPILLARY
Glucose-Capillary: 140 mg/dL — ABNORMAL HIGH (ref 65–99)
Glucose-Capillary: 167 mg/dL — ABNORMAL HIGH (ref 65–99)
Glucose-Capillary: 170 mg/dL — ABNORMAL HIGH (ref 65–99)
Glucose-Capillary: 159 mg/dL — ABNORMAL HIGH (ref 65–99)

## 2015-12-06 LAB — URINE CULTURE: Culture: NO GROWTH

## 2015-12-06 MED ORDER — FUROSEMIDE 40 MG PO TABS
40.0000 mg | ORAL_TABLET | Freq: Once | ORAL | Status: AC
  Administered 2015-12-06: 40 mg via ORAL
  Filled 2015-12-06: qty 1

## 2015-12-06 NOTE — Progress Notes (Signed)
Physical Therapy Session Note  Patient Details  Name: [name] MRN: [id number] Date of Birth: July 18, 1940  Today's Date: 12/06/2015 PT Individual Time: [id number] PT Individual Time Calculation (min): 46 min   Short Term Goals: Week 1:  PT Short Term Goal 1 (Week 1): Pt will perform bed mobility on flat bed, no rails with mod A and recall back precautions with 50% cues PT Short Term Goal 2 (Week 1): Pt will perform sit<> stand and stand pivot transfers with RW and min A consistently with 50% cues for safety and sequencing PT Short Term Goal 3 (Week 1): Pt will perform w/c mobility x 50' in controlled environment with supervision PT Short Term Goal 4 (Week 1): Pt will perform 4 stair negotiation with bilat rails and mod A PT Short Term Goal 5 (Week 1): Pt will ambulate with RW x 50' with min A and reporting pain at <5/10.  Skilled Therapeutic Interventions/Progress Updates:    Pt received in recliner with family present, pt noted 6/10 back pain but agreeable to PT. Pt unable to recall "BAT" back precautions & PT educated her on these. Transferred sit<>stand with Min A with cuing for hand placement on armrests instead of pulling up on RW. Gait training x 10 ft + 10 ft with pt sitting abruptly in chair due to c/o pain radiating down LLE. Educated pt on need to turn to square up to chair & reach back with BUE for stable surface before transferring. Pt able to propel w/c 20 ft + 10 ft with BUE & mod A for steering/completing turns. Utilized nu-step level 1 x 5 minutes with assistance placing LE onto pedals & with rest breaks during activity 2/2 LLE pain. Pt completed stand pivot nu-step>w/c with min A & RW & PT transported pt back to room via w/c total A. Pt transferred w/c>recliner in same manner as noted above & left in recliner with all needs within reach & husband present. Therapist educated RN on pt's significant pain throughout session causing her to become tearful at times, discussed benefits  of cold pack to help reduce inflammation s/p surgery & potentially help relieve pain; RN to follow up.   Therapy Documentation Precautions:  Precautions Precautions: Back, Fall Precaution Booklet Issued: No Precaution Comments: 2 of 3 BAT recalled during OT eval Restrictions Weight Bearing Restrictions: No  Pain: Pain Assessment Pain Assessment: 0-10 Pain Score: 6  Pain Location: Back Pain Intervention(s): Repositioned;Ambulation/increased activity   See Function Navigator for Current Functional Status.   Therapy/Group: Individual Therapy  Waunita Schooner 12/06/2015, 12:56 PM

## 2015-12-06 NOTE — Progress Notes (Signed)
Physical Therapy Session Note  Patient Details  Name: [name] MRN: [id number] Date of Birth: 10/14/1940  Today's Date: 12/06/2015 PT Individual Time: 1415-1500 PT Individual Time Calculation (min): 45 min   Short Term Goals: Week 1:  PT Short Term Goal 1 (Week 1): Pt will perform bed mobility on flat bed, no rails with mod A and recall back precautions with 50% cues PT Short Term Goal 2 (Week 1): Pt will perform sit<> stand and stand pivot transfers with RW and min A consistently with 50% cues for safety and sequencing PT Short Term Goal 3 (Week 1): Pt will perform w/c mobility x 50' in controlled environment with supervision PT Short Term Goal 4 (Week 1): Pt will perform 4 stair negotiation with bilat rails and mod A PT Short Term Goal 5 (Week 1): Pt will ambulate with RW x 50' with min A and reporting pain at <5/10.  Skilled Therapeutic Interventions/Progress Updates:    Pt received seated in recliner, c/o pain as below and agreeable to treatment with encouragement. Educated pt on importance of mobility for reducing pain, improving strength and endurance. Pt ambulated from recliner>chair x8' and chair>w/c x10' with min guard and RW. Then performed gait 2x27', 1x35' with RW and min guard. Sit <>stand for each gait trial modA faded to min guard by last trial. Cues to reduce reliance on rail in hallway with RUE. Seated rest breaks with pt cued for deep breathing to slow HR and RR, attempt to calm pain/anxiety. Returned to room and transferred w/c >recliner with min guard. Remained seated in recliner with BLEs elevated and all needs within reach at completion of session.   Therapy Documentation Precautions:  Precautions Precautions: Back, Fall Precaution Booklet Issued: No Precaution Comments: 2 of 3 BAT recalled during OT eval Restrictions Weight Bearing Restrictions: No Pain: Pain Assessment Pain Assessment: 0-10 Pain Score: 10-Worst pain ever Pain Type: Acute pain;Surgical  pain Pain Location: Back Pain Orientation: Mid;Lower Pain Radiating Towards: down both legs Pain Descriptors / Indicators: Aching;Grimacing;Sharp;Radiating Pain Frequency: Constant Pain Onset: On-going Patients Stated Pain Goal: 4 Pain Intervention(s): Medication (See eMAR);Repositioned (oxycodone 10 mg po)   See Function Navigator for Current Functional Status.   Therapy/Group: Individual Therapy  Luberta Mutter 12/06/2015, 3:45 PM

## 2015-12-06 NOTE — Progress Notes (Signed)
[place] PHYSICAL MEDICINE & REHABILITATION     PROGRESS NOTE    Subjective/Complaints: Had a much better night. Pain improved as a whole. Legs still with swelling above her baseline.   ROS: Pt denies fever, rash/itching, headache, blurred or double vision, nausea, vomiting,, diarrhea, chest pain, shortness of breath, palpitations, dysuria, dizziness, neck  pain, bleeding,  or depression  Objective: Vital Signs: Blood pressure 158/58, pulse 64, temperature 98.7 F (37.1 C), temperature source Oral, resp. rate 18, weight 92.579 kg (204 lb 1.6 oz), SpO2 100 %. Dg Chest 2 View  12/04/2015  CLINICAL DATA:  Wheezing. EXAM: CHEST  2 VIEW COMPARISON:  Nov 23, 2015. FINDINGS: Stable cardiomediastinal silhouette. Status post coronary artery bypass graft. No pneumothorax or pleural effusion is noted. No acute pulmonary disease is noted. Bony thorax is unremarkable. IMPRESSION: No active cardiopulmonary disease. Electronically Signed   By: Marijo Conception, M.D.   On: 12/04/2015 20:26   Dg Abd 1 View  12/04/2015  CLINICAL DATA:  Abdominal pain. EXAM: ABDOMEN - 1 VIEW COMPARISON:  None. FINDINGS: The bowel gas pattern is normal. No radio-opaque calculi or other significant radiographic abnormality are seen. IMPRESSION: No evidence of bowel obstruction or ileus. Electronically Signed   By: Marijo Conception, M.D.   On: 12/04/2015 20:28    Recent Labs  12/05/15 0450 12/06/15 0651  WBC 8.9 7.9  HGB 8.2* 8.0*  HCT 25.2* 24.5*  PLT 145* 154    Recent Labs  12/04/15 0316 12/05/15 0450  NA 134* 133*  K 3.6 3.8  CL 102 100*  GLUCOSE 167* 149*  BUN 66* 69*  CREATININE 3.00* 3.04*  CALCIUM 8.5* 8.7*   CBG (last 3)   Recent Labs  12/05/15 1658 12/05/15 2104 12/06/15 0643  GLUCAP 187* 190* 140*    Wt Readings from Last 3 Encounters:  12/06/15 92.579 kg (204 lb 1.6 oz)  12/01/15 86.183 kg (190 lb)  11/23/15 86.274 kg (190 lb 3.2 oz)    Physical Exam:   Nursing note and vitals  reviewed. Constitutional: She is oriented to person, place, and time. She appears well-developed and well-nourished. She appears comfortable lying on her side in bed.  HENT:  Head: Normocephalic and atraumatic.  Mouth/Throat: Oropharynx is clear and moist.  Eyes: Conjunctivae and EOM are normal. Pupils are equal, round, and reactive to light. Right eye exhibits no discharge.  Neck: Normal range of motion. Neck supple.  Cardiovascular: Normal rate and regular rhythm.  Respiratory: Effort normal. No stridor. She has wheezes (expiratory wheezes).  GI: Soft. She exhibits no distension. Bowel sounds are decreased. There is no tenderness.  Musculoskeletal: She exhibits edema (1+ pedal edema and B-tibially) and tenderness.  Positive thrill LUE graft  Neurological: She is alert and oriented to person, place, and time.  Patient is very hard of hearing.  Follows simple commands. DTRs symmetric Motor: B/l UE 4+/5 proximal to distal B/l LE: Hip flexion, knee extension 4-/5, ankle dorsi/plantar flexion 4/5  Skin: Skin is warm and dry.  Back incision clean and dry with dressing in place  Skin: Skin is warm and dry. No rash noted.  Back incision clean and dry with steristrps and foam dressing Psychiatric: She has a normal mood and affect. Her behavior is calm  .  Assessment/Plan: 1. 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 Physiatrist is providing close team supervision  and 24 hour management of active medical problems listed below. Physiatrist and rehab team continue to assess barriers to discharge/monitor patient progress toward functional and medical goals.  Function:  Bathing Bathing position Bathing activity did not occur: Refused Position: Shower  Bathing parts Body parts bathed by patient: Right arm, Left arm, Chest, Abdomen,  Front perineal area Body parts bathed by helper: Buttocks, Right upper leg, Left upper leg  Bathing assist        Upper Body Dressing/Undressing Upper body dressing   What is the patient wearing?: Hospital gown     Pull over shirt/dress - Perfomed by patient: Thread/unthread right sleeve, Thread/unthread left sleeve, Put head through opening Pull over shirt/dress - Perfomed by helper: Pull shirt over trunk        Upper body assist Assist Level: Touching or steadying assistance(Pt > 75%)      Lower Body Dressing/Undressing Lower body dressing   What is the patient wearing?: Non-skid slipper socks Underwear - Performed by patient: Pull underwear up/down Underwear - Performed by helper: Thread/unthread right underwear leg, Thread/unthread left underwear leg Pants- Performed by patient: Pull pants up/down Pants- Performed by helper: Thread/unthread right pants leg, Thread/unthread left pants leg   Non-skid slipper socks- Performed by helper: Don/doff right sock, Don/doff left sock                  Lower body assist Assist for lower body dressing: Touching or steadying assistance (Pt > 75%) (Max assist)      Toileting Toileting   Toileting steps completed by patient: Performs perineal hygiene, Adjust clothing after toileting Toileting steps completed by helper: Adjust clothing prior to toileting Toileting Assistive Devices: Prosthesis/orthosis  Toileting assist Assist level: Set up/obtain supplies   Transfers Chair/bed transfer   Chair/bed transfer method: Stand pivot Chair/bed transfer assist level: Maximal assist (Pt 25 - 49%/lift and lower) Chair/bed transfer assistive device: Armrests, Medical sales representative     Max distance: 15 Assist level: Touching or steadying assistance (Pt > 75%)   Wheelchair   Type: Manual   Assist Level: Dependent (Pt equals 0%)  Cognition Comprehension Comprehension assist level: Follows basic conversation/direction  with extra time/assistive device  Expression Expression assist level: Expresses basic needs/ideas: With no assist  Social Interaction Social Interaction assist level: Interacts appropriately 90% of the time - Needs monitoring or encouragement for participation or interaction.  Problem Solving Problem solving assist level: Solves basic 90% of the time/requires cueing < 10% of the time  Memory Memory assist level: Recognizes or recalls 90% of the time/requires cueing < 10% of the time   Medical Problem List and Plan: 1. 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  -continue CIR therapies  -provided positive reinforcement about prognosis/pain 2. DVT Prophylaxis/Anticoagulation: Pharmaceutical: Lovenox 3. Pain Management: pain improved today  -oxycodone increased to 10mg . Scheduled robaxin to help with muscle spasms and low dose ultram 50mg  qid  -. K pad additionally for local measures.  4. Mood: Needs a lot of encouragement and ego support as is self limiting. LCSW to follow for evaluation and support.  5. Neuropsych: This patient is capable of making decisions on her own behalf. 6. Skin/Wound Care: Monitor wound daily for healing 7. Fluids/Electrolytes/Nutrition: Monitor I/O. I personally reviewed the patient's labs today.  8. T2DM: Will monitor BS ac/hs. Continue lantus at bedtime with trajenta in am. Will use SSI for elevated BS.  9. CAD: Continue lopressor  and lipitor.  10. Wheezing: denies increase in chronic SOB.  CXR without acute disease. I independently reviewed the patient's images today. . 12. CKD with BLE edema: Continue lasix bid. Cr remains at 3. ---recheck tomorrow  -will give an additional diuretic to rx edema ---checking daily weights 13. Abdominal pain: KUB negative. Continue to work on regular bowel schedule.  14. Urinary retention:ua neg, cx still pending. Monitor PVRs and cath for volumes > 350cc   15. Acute on chronic anemia due to CKD: has had iron and epo? injections in the past.  16. H/o gout: off colchicine  17. Thrombocytopenia: Cont to monitor 18. Hyponatremia: Cont to monitor 19. AFib: Monitor HR with increased activity 20. HTN: Monitor with increased activity LOS (Days) 2 A FACE TO FACE EVALUATION WAS PERFORMED  Clement Deneault T 12/06/2015 10:57 AM

## 2015-12-06 NOTE — Progress Notes (Signed)
Social Work Patient ID: [name], female   DOB: December 24, 1940, 75 y.o.   MRN: [id number]   Met with pt, spouse and daughter this afternoon to review team conference info. All aware and agreeable with targeted d/c date of 6/2 and supervision/ min assist goals.  No concerns.  Antwaun Buth, LCSW

## 2015-12-06 NOTE — Progress Notes (Addendum)
Occupational Therapy Session Note  Patient Details  Name: [name] MRN: [id number] Date of Birth: [birth year]  Today's Date: 12/06/2015 OT Individual Time: [id number] and 1300-1345 OT Individual Time Calculation (min): 60 min and 45 min   Short Term Goals: Week 1:  OT Short Term Goal 1 (Week 1): Pt will complete lower body dressing using AE to maintain back precatuions 100% of the time OT Short Term Goal 2 (Week 1): Pt will complete toilet hygiene independently OT Short Term Goal 3 (Week 1): Pt will bathe, sitting and standing with steadying assist OT Short Term Goal 4 (Week 1): Pt will demo ability to perform light home making task using RW with min assist  Skilled Therapeutic Interventions/Progress Updates:    Session One: Pt seen for OT ADL bathing/dressing session. Pt in supine upon arrival, voicing 6/10 pain in L hip/ LE. She reported having been pre medicated prior to tx session.  She transferred to EOB with supervision using hospital bed functions. Mod A stand pivot with HHA completed to w/Espinoza.  She completed stand pivot into shower and bathed seated on tub bench. She required VCs for sequencing, use of LH sponge, and steadying assist to complete standing buttock hygiene.  She became very fatigued following showering task due to decreased activity tolerance. VCs provided for deep breathing techniques. She declined dressing, wanting to wear hospital gown due to fatigue. She completed grooming tasks from w/Espinoza level with set-up assist.  She ambulated ~4 ft with HHA to recliner with mod A. Pt left sitting up in recliner at end of session, all needs in reach and husband present.  Pt and husband educated throughout session regarding role of OT, OT vs PT, OT goals, and d/Espinoza planning.   Session Two: Pt seen for OT session focusing on dressing. Pt in recliner upon arrival with family members present. She voiced increased pain in LE, however, not yet due for pain meds. She completed dressing  task from recliner level. She required cues for orientation of sock aid, she was educated regarding use during yesterday's session, however, cont to require cues for proper use. She used reacher for donning pants/ underwear and stood at [employer] to pull pants up with steadying assist. Required extended rest breaks throughout session due to pain and decreased activity tolerance. She requested to return to bed at end of session. Min A stand pivot completed to bed. Required assist for management of B LEs into bed. She remained in side lying position as she was uncomfortable in supine. RN made aware of pt's complaints of pain and position.   Educated family and pt regarding AE, back precautions, benefits of mobility, importance of participation with ADLs/ IADLs, and d/Espinoza planning.   Therapy Documentation Precautions:  Precautions Precautions: Back, Fall Precaution Booklet Issued: No Precaution Comments: 2 of 3 BAT recalled during OT eval Restrictions Weight Bearing Restrictions: No Pain: Pain Assessment Pain Assessment: 0-10 Pain Score: 10-Worst pain ever Pain Type: Acute pain;Surgical pain Pain Location: Back Pain Orientation: Mid;Lower Pain Radiating Towards: down both legs Pain Descriptors / Indicators: Aching;Grimacing;Sharp;Radiating Pain Frequency: Constant Pain Onset: On-going Patients Stated Pain Goal: 4 Pain Intervention(s): Rest; Repositioned ADL: ADL ADL Comments: see Functional Assessment Tool  See Function Navigator for Current Functional Status.   Therapy/Group: Individual Therapy  Lewis, [name] 12/06/2015, 6:51 AM

## 2015-12-07 ENCOUNTER — Inpatient Hospital Stay (HOSPITAL_COMMUNITY): Payer: Medicare Other

## 2015-12-07 ENCOUNTER — Inpatient Hospital Stay (HOSPITAL_COMMUNITY): Payer: Medicare Other | Admitting: Occupational Therapy

## 2015-12-07 ENCOUNTER — Inpatient Hospital Stay (HOSPITAL_COMMUNITY): Payer: Medicare Other | Admitting: Physical Therapy

## 2015-12-07 LAB — URINALYSIS, ROUTINE W REFLEX MICROSCOPIC
Bilirubin Urine: NEGATIVE
GLUCOSE, UA: NEGATIVE mg/dL
KETONES UR: NEGATIVE mg/dL
LEUKOCYTES UA: NEGATIVE
NITRITE: POSITIVE — AB
PH: 5 (ref 5.0–8.0)
PROTEIN: 100 mg/dL — AB
Specific Gravity, Urine: 1.015 (ref 1.005–1.030)

## 2015-12-07 LAB — URINE MICROSCOPIC-ADD ON

## 2015-12-07 LAB — SODIUM, URINE, RANDOM: SODIUM UR: 25 mmol/L

## 2015-12-07 LAB — BASIC METABOLIC PANEL
Anion gap: 13 (ref 5–15)
BUN: 79 mg/dL — AB (ref 6–20)
CALCIUM: 9.3 mg/dL (ref 8.9–10.3)
CO2: 21 mmol/L — ABNORMAL LOW (ref 22–32)
CREATININE: 3.25 mg/dL — AB (ref 0.44–1.00)
Chloride: 98 mmol/L — ABNORMAL LOW (ref 101–111)
GFR, EST AFRICAN AMERICAN: 15 mL/min — AB (ref >60)
GFR, EST NON AFRICAN AMERICAN: 13 mL/min — AB (ref >60)
Glucose, Bld: 132 mg/dL — ABNORMAL HIGH (ref 65–99)
Potassium: 4.2 mmol/L (ref 3.5–5.1)
SODIUM: 132 mmol/L — AB (ref 135–145)

## 2015-12-07 LAB — IRON AND TIBC
IRON: 18 ug/dL — AB (ref 28–170)
Saturation Ratios: 7 % — ABNORMAL LOW (ref 10.4–31.8)
TIBC: 266 ug/dL (ref 250–450)
UIBC: 248 ug/dL

## 2015-12-07 LAB — GLUCOSE, CAPILLARY
GLUCOSE-CAPILLARY: 114 mg/dL — AB (ref 65–99)
Glucose-Capillary: 121 mg/dL — ABNORMAL HIGH (ref 65–99)
Glucose-Capillary: 129 mg/dL — ABNORMAL HIGH (ref 65–99)
Glucose-Capillary: 124 mg/dL — ABNORMAL HIGH (ref 65–99)

## 2015-12-07 LAB — URIC ACID: Uric Acid, Serum: 8.3 mg/dL — ABNORMAL HIGH (ref 2.3–6.6)

## 2015-12-07 LAB — CREATININE, URINE, RANDOM: Creatinine, Urine: 64.94 mg/dL

## 2015-12-07 MED ORDER — SORBITOL 70 % SOLN
60.0000 mL | Status: AC
  Filled 2015-12-07: qty 60

## 2015-12-07 MED ORDER — ALPRAZOLAM 0.25 MG PO TABS
0.2500 mg | ORAL_TABLET | Freq: Three times a day (TID) | ORAL | Status: DC | PRN
  Administered 2015-12-07 – 2015-12-10 (×3): 0.25 mg via ORAL
  Filled 2015-12-07 (×3): qty 1

## 2015-12-07 NOTE — Progress Notes (Signed)
Physical Therapy Session Note  Patient Details  Name: [name] MRN: [id number] Date of Birth: 1940/11/09  Today's Date: 12/07/2015 PT Individual Time: 1300-1415 PT Individual Time Calculation (min): 75 min   Short Term Goals: Week 1:  PT Short Term Goal 1 (Week 1): Pt will perform bed mobility on flat bed, no rails with mod A and recall back precautions with 50% cues PT Short Term Goal 2 (Week 1): Pt will perform sit<> stand and stand pivot transfers with RW and min A consistently with 50% cues for safety and sequencing PT Short Term Goal 3 (Week 1): Pt will perform w/c mobility x 50' in controlled environment with supervision PT Short Term Goal 4 (Week 1): Pt will perform 4 stair negotiation with bilat rails and mod A PT Short Term Goal 5 (Week 1): Pt will ambulate with RW x 50' with min A and reporting pain at <5/10.  Skilled Therapeutic Interventions/Progress Updates:    Pt received seated in w/c finishing lunch, c/o 10/10 back and L leg pain with daughter reporting she recently took pain medication. Gait training x6 trials (20', 20', 55', 22', 32', 25'). Requires extended seated rest breaks between each trial due to fatigue. Standing tolerance 3 x 1 min while throwing horseshoes at target. Performed car transfer minA with RW. Stair training on 6-inch stairs with B handrails and min guard. Cues for sequencing and posture. Pt with auditory/visual hallucinations occasionally during session; daughter reports this is new, and RN alerted. Returned to room in w/c totalA for energy conservation. Short distance gait in room from w/c >recliner with RW and min guard. Remained seated in recliner at completion of session, all needs within reach.   Therapy Documentation Precautions:  Precautions Precautions: Back, Fall Precaution Booklet Issued: No Precaution Comments: 2 of 3 BAT recalled during OT eval Restrictions Weight Bearing Restrictions: No   See Function Navigator for Current  Functional Status.   Therapy/Group: Individual Therapy  Luberta Mutter 12/07/2015, 3:36 PM

## 2015-12-07 NOTE — IPOC Note (Signed)
Overall Plan of Care [facility]) Patient Details Name: [name] MRN: [id number] DOB: [birth year]  Admitting Diagnosis: Lumbar Microdiscectomy  Hospital Problems: Active Problems:   Lumbar back pain with radiculopathy affecting left lower extremity   Bilateral calf pain   Pain   Wheezing   Abnormality of gait   Radiculopathy of lumbosacral region   Post-operative pain   Type 2 diabetes mellitus with peripheral neuropathy ([place])   Coronary artery disease involving native coronary artery of native heart without angina pectoris   CKD (chronic kidney disease)   AP (abdominal pain)   Urinary retention   Anemia of chronic disease   Acute blood loss anemia   Hx of gout   Thrombocytopenia (HCC)   Hyponatremia   Persistent atrial fibrillation (HCC)   Benign essential HTN     Functional Problem List: Nursing Bowel, Edema, Pain, Skin Integrity  PT Balance, Edema, Endurance, Motor, Pain, Sensory  OT Pain, Balance, Endurance  SLP    TR         Basic ADL's: OT Grooming, Bathing, Dressing, Toileting     Advanced  ADL's: OT Light Housekeeping (To reinforce back precautions)     Transfers: PT Bed Mobility, Bed to Chair, Car, Manufacturing systems engineer, Metallurgist: PT Ambulation, Emergency planning/management officer, Stairs     Additional Impairments: OT None  SLP        TR      Anticipated Outcomes Item Anticipated Outcome  Self Feeding Independent  Swallowing      Basic self-care  Supervision  Toileting  Mod I   Bathroom Transfers Supervision  Bowel/Bladder  Mod I  Transfers  Supervision  Locomotion  Supervision  Communication     Cognition     Pain  <5  Safety/Judgment  Supervision   Therapy Plan: PT Intensity: Minimum of 1-2 x/day ,45 to 90 minutes PT Frequency: 5 out of 7 days PT Duration Estimated Length of Stay: 10-14 days OT Intensity: Minimum of 1-2 x/day, 45 to 90 minutes OT Frequency: 5 out of 7 days OT Duration/Estimated Length of Stay: 10-12  days         Team Interventions: Nursing Interventions Patient/Family Education, Bowel Management, Disease Management/Prevention, Pain Management, Skin Care/Wound Management  PT interventions Ambulation/gait training, Balance/vestibular training, Discharge planning, DME/adaptive equipment instruction, Functional mobility training, Neuromuscular re-education, Pain management, Patient/family education, Psychosocial support, Splinting/orthotics, Stair training, Therapeutic Activities, Therapeutic Exercise, UE/LE Strength taining/ROM, Wheelchair propulsion/positioning  OT Interventions Therapeutic Exercise, Therapeutic Activities, Self Care/advanced ADL retraining, Patient/family education, DME/adaptive equipment instruction, Discharge planning, Pain management, Functional mobility training  SLP Interventions    TR Interventions    SW/CM Interventions Discharge Planning, Psychosocial Support, Patient/Family Education    Team Discharge Planning: Destination: PT-Home ,OT- Home , SLP-  Projected Follow-up: PT-Home health PT, 24 hour supervision/assistance, OT-  Home health OT, SLP-  Projected Equipment Needs: PT-Wheelchair (measurements), Wheelchair cushion (measurements), OT- To be determined, SLP-  Equipment Details: PT-manual w/c vs. transport w/c for community, OT-  Patient/family involved in discharge planning: PT- Patient, Family Midwife,  OT-Patient, Family member/caregiver, SLP-   MD ELOS: 10-12 days Medical Rehab Prognosis:  Excellent Assessment: The patient has been admitted for CIR therapies with the diagnosis of lumbar disk disease with radiculopathy s/p microdiscectomy. The team will be addressing functional mobility, strength, stamina, balance, safety, adaptive techniques and equipment, self-care, bowel and bladder mgt, patient and caregiver education, pain mgt, back precautions, wound care, anxiety control, ego support, community reintegration. Goals have been  set at mod I to  supervision for mobility and self-care. Anxiety and pain mgt have been barriers this far.    Meredith Staggers, MD, FAAPMR      See Team Conference Notes for weekly updates to the plan of care

## 2015-12-07 NOTE — Progress Notes (Signed)
Physical Therapy Session Note  Patient Details  Name: [name] MRN: [id number] Date of Birth: [birth year]  Today's Date: 12/07/2015 PT Individual Time: 0800-0900 PT Individual Time Calculation (min): 60 min   Short Term Goals: Week 1:  PT Short Term Goal 1 (Week 1): Pt will perform bed mobility on flat bed, no rails with mod A and recall back precautions with 50% cues PT Short Term Goal 2 (Week 1): Pt will perform sit<> stand and stand pivot transfers with RW and min A consistently with 50% cues for safety and sequencing PT Short Term Goal 3 (Week 1): Pt will perform w/c mobility x 50' in controlled environment with supervision PT Short Term Goal 4 (Week 1): Pt will perform 4 stair negotiation with bilat rails and mod A PT Short Term Goal 5 (Week 1): Pt will ambulate with RW x 50' with min A and reporting pain at <5/10.  Skilled Therapeutic Interventions/Progress Updates:    Patient received sitting in [facility] and agreeable to PT. PT donned patient's TED hose with max A due to inability to performed forward flexion to reach feet with Back precautions. Gait in room for 32ft with RW and min A from PT to bathroom of bowel and bladder movement. Min verbal and visual cues for AD management around obstacles through door to bathroom.  PT instructed patient in toilet transfer to [facility] in bathroom with min A and mod cues for improved UE placement for safety  Patient was able to perform Sit<>stand with min A at toilet with RW and perform personal hygiene while standing with min A from PT. Min cues for improved technique and to prevent increased trunk rotation to maintain back precautions.  Following toiletting, patient performed gait training for 10 ft with RW to [facility] with min A from PT and mod cues for proper UE placement with transfer to [place].  WC mobility performed in hall with BUE propulsion for 65ft with mod verbal and visual cues for improved obstacle navigation and  PT fit patient for ELR following WC  mobility to aide in edema control.   Patient Left in [facility] with call bell within reach.    Therapy Documentation Precautions:  Precautions Precautions: Back, Fall Precaution Booklet Issued: No Precaution Comments: 2 of 3 BAT recalled during OT eval Restrictions Weight Bearing Restrictions: No General:   Vital Signs: Therapy Vitals Temp: 98.2 F (36.8 C) Temp Source: Oral Pulse Rate: 66 Resp: 18 BP: 120/68 mmHg Patient Position (if appropriate): Sitting Oxygen Therapy SpO2: 100 % O2 Device: Not Delivered Pain: Pain Assessment Pain Assessment: 0-10 Pain Score: 7  Pain Type: Chronic pain Pain Location: Leg Pain Orientation: Left Pain Descriptors / Indicators: Shooting Pain Frequency: Constant Pain Onset: On-going Patients Stated Pain Goal: 4 Pain Intervention(s): Ambulation/increased activity    See Function Navigator for Current Functional Status.   Therapy/Group: Individual Therapy  Lorie Phenix 12/07/2015, 9:01 AM

## 2015-12-07 NOTE — Progress Notes (Addendum)
[facility]     PROGRESS NOTE    Subjective/Complaints: Having terrific pain this morning. Can't find a comfortable position. In tears. "everything hurts".   ROS: Pt denies fever, rash/itching, headache, blurred or double vision, nausea, vomiting,, diarrhea, chest pain, shortness of breath, palpitations, dysuria, dizziness, neck  pain, bleeding,  or depression  Objective: Vital Signs: Blood pressure 120/68, pulse 66, temperature 98.2 F (36.8 C), temperature source Oral, resp. rate 18, weight 92.579 kg (204 lb 1.6 oz), SpO2 100 %. No results found.  Recent Labs  12/05/15 0450 12/06/15 0651  WBC 8.9 7.9  HGB 8.2* 8.0*  HCT 25.2* 24.5*  PLT 145* 154    Recent Labs  12/05/15 0450 12/07/15 0521  NA 133* 132*  K 3.8 4.2  CL 100* 98*  GLUCOSE 149* 132*  BUN 69* 79*  CREATININE 3.04* 3.25*  CALCIUM 8.7* 9.3   CBG (last 3)   Recent Labs  12/06/15 1620 12/06/15 2040 12/07/15 0654  GLUCAP 170* 159* 114*    Wt Readings from Last 3 Encounters:  12/06/15 92.579 kg (204 lb 1.6 oz)  12/01/15 86.183 kg (190 lb)  11/23/15 86.274 kg (190 lb 3.2 oz)    Physical Exam:   Nursing note and vitals reviewed. Constitutional: She is oriented to person, place, and time. She appears well-developed and well-nourished. She appears comfortable lying on her side in bed.  HENT:  Head: Normocephalic and atraumatic.  Mouth/Throat: Oropharynx is clear and moist.  Eyes: Conjunctivae and EOM are normal. Pupils are equal, round, and reactive to light. Right eye exhibits no discharge.  Neck: Normal range of motion. Neck supple.  Cardiovascular: Normal rate and regular rhythm.  Respiratory: Effort normal. No stridor. She has wheezes (expiratory wheezes).  GI: Soft. She exhibits mild distension. Bowel sounds are present. There is no tenderness.  Musculoskeletal: She exhibits edema (1+ pedal edema and B-tibially) and tenderness.  Positive thrill LUE graft   Neurological: She is alert and oriented to person, place, and time.  Patient is very hard of hearing.  Follows simple commands. DTRs symmetric Motor: B/l UE 4+/5 proximal to distal B/l LE: Hip flexion, knee extension 4-/5, ankle dorsi/plantar flexion 4/5  Skin: Skin is warm and dry.  Back incision clean and dry with dressing in place  Skin: Skin is warm and dry. No rash noted.  Back incision clean and dry with steristrps and foam dressing Psychiatric: She has a normal mood and affect. Her behavior is anxious  .  Assessment/Plan: 1. 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 Physiatrist is providing close team supervision and 24 hour management of active medical problems listed below. Physiatrist and rehab team continue to assess barriers to discharge/monitor patient progress toward functional and medical goals.  Function:  Bathing Bathing position Bathing activity did not occur: Refused Position: Production manager parts bathed by patient: Right arm, Left arm, Chest, Abdomen, Front perineal area Body parts bathed by helper: Buttocks, Right upper leg, Left upper leg  Bathing assist        Upper Body Dressing/Undressing Upper body dressing   What is the patient wearing?: Pull over shirt/dress     Pull over shirt/dress - Perfomed by patient: Thread/unthread right sleeve, Thread/unthread left sleeve, Put head through opening, Pull shirt over trunk Pull over shirt/dress - Perfomed by helper: Pull shirt over trunk  Upper body assist Assist Level: Set up, Supervision or verbal cues   Set up : To obtain clothing/put away  Lower Body Dressing/Undressing Lower body dressing   What is the patient wearing?: Pants, Non-skid slipper socks, Underwear Underwear - Performed by patient: Thread/unthread right underwear  leg, Thread/unthread left underwear leg, Pull underwear up/down Underwear - Performed by helper: Thread/unthread right underwear leg, Thread/unthread left underwear leg Pants- Performed by patient: Thread/unthread right pants leg, Thread/unthread left pants leg, Pull pants up/down Pants- Performed by helper: Thread/unthread right pants leg, Thread/unthread left pants leg Non-skid slipper socks- Performed by patient: Don/doff right sock, Don/doff left sock Non-skid slipper socks- Performed by helper: Don/doff right sock, Don/doff left sock                  Lower body assist Assist for lower body dressing: Touching or steadying assistance (Pt > 75%) (Reacher)      Toileting Toileting   Toileting steps completed by patient: Performs perineal hygiene, Adjust clothing after toileting Toileting steps completed by helper: Adjust clothing prior to toileting Toileting Assistive Devices: Prosthesis/orthosis  Toileting assist Assist level: Set up/obtain supplies   Transfers Chair/bed transfer   Chair/bed transfer method: Stand pivot Chair/bed transfer assist level: Touching or steadying assistance (Pt > 75%) Chair/bed transfer assistive device: Walker, Air cabin crew     Max distance: 10 ft Assist level: Touching or steadying assistance (Pt > 75%)   Wheelchair   Type: Manual Max wheelchair distance: 20 ft Assist Level: Moderate assistance (Pt 50 - 74%)  Cognition Comprehension Comprehension assist level: Follows basic conversation/direction with extra time/assistive device  Expression Expression assist level: Expresses basic needs/ideas: With no assist  Social Interaction Social Interaction assist level: Interacts appropriately 90% of the time - Needs monitoring or encouragement for participation or interaction.  Problem Solving Problem solving assist level: Solves basic 90% of the time/requires cueing < 10% of the time  Memory Memory assist level: Recognizes or  recalls 90% of the time/requires cueing < 10% of the time   Medical Problem List and Plan: 1. 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  -continue CIR therapies  -provided positive reinforcement about prognosis/pain 2. DVT Prophylaxis/Anticoagulation: Pharmaceutical: Lovenox 3. Pain Management: pain improved today  -oxycodone increased to 10mg . Scheduled robaxin to help with muscle spasms and low dose ultram 50mg  qid  -. K pad additionally for local measures.  4. Mood: Needs a lot of encouragement and ego support as is self limiting.   -anxiety a major issue. Add prn xanax.   -request neuropsych.  5. Neuropsych: This patient is capable of making decisions on her own behalf. 6. Skin/Wound Care: Monitor wound daily for healing 7. Fluids/Electrolytes/Nutrition: Monitor I/O. I personally reviewed the patient's labs today.  8. T2DM: Will monitor BS ac/hs. Continue lantus at bedtime with trajenta in am. Will use SSI for elevated BS.  9. CAD: Continue lopressor and lipitor.  10. Wheezing: denies increase in chronic SOB.  CXR without acute disease. I independently reviewed the patient's images today. . 12. CKD with persistent BLE edema: Continue lasix bid. Cr up to 3.25 today (baseline around 3?)--recheck tomorrow  -gave additional dose of lasix yesterday  -consider nephrology consult (sees Dr. Mercy Moore) 63. Abdominal pain: KUB negative. Continue to work on regular bowel schedule.  -sorbitol today, fleet enema if needed 14. Urinary retention:ua neg, cx negative Monitor PVRs and cath for volumes > 350cc  15. Acute  on chronic anemia due to CKD: has had iron and epo? injections in the past.  16. H/o gout: off colchicine  17. Thrombocytopenia: Cont to monitor 18. Hyponatremia: Cont to monitor 19. AFib: Monitor HR with increased activity 20. HTN: Monitor with increased activity  LOS (Days) 3 A FACE TO FACE  EVALUATION WAS PERFORMED  Miranda Frese T 12/07/2015 8:20 AM

## 2015-12-07 NOTE — Progress Notes (Signed)
Occupational Therapy Session Note  Patient Details  Name: [name] MRN: [id number] Date of Birth: [birth year]  Today's Date: 12/07/2015 OT Individual Time: 1100-1200 OT Individual Time Calculation (min): 60 min    Short Term Goals: Week 1:  OT Short Term Goal 1 (Week 1): Pt will complete lower body dressing using AE to maintain back precatuions 100% of the time OT Short Term Goal 2 (Week 1): Pt will complete toilet hygiene independently OT Short Term Goal 3 (Week 1): Pt will bathe, sitting and standing with steadying assist OT Short Term Goal 4 (Week 1): Pt will demo ability to perform light home making task using RW with min assist  Skilled Therapeutic Interventions/Progress Updates:    Pt seen for OT ADL bathing/dressing session. Pt asleep in supine upon arrival. She voiced increased fatigue and required extra encouragement for participation in therapy session. She transferred to EOB with VCs for hand palcement and log rolling technique. She required min A to stand at RW, and then immediately needed another seated rest break prior to amblating to w/c. She walked ~77ft to w/c with min A.  She completed bathing/ dressing seated in w/c at sink with set-up assist and rest breaks throughout. Cues provided for deep breathing technique as pt became very overwhelmed with fatigue and LE discomfort. She required cues to initiate use of LH sponge and to recall spinal precautions during functional tasks. She used reacher to assist with LB dressing and stood with steadying assist to pull pants up. Pt left sitting in w/c at end of session, all needs in reach, and family members present. .  Pt's family educated regarding importance of participation with ADLs, benefits of OOB, and d/c planning.   Therapy Documentation Precautions:  Precautions Precautions: Back, Fall Precaution Booklet Issued: No Precaution Comments: 2 of 3 BAT recalled during OT eval Restrictions Weight Bearing Restrictions:  No Pain: Pain Assessment Pain Assessment: 0-10 Pain Score: 8  Pain Type: Chronic pain Pain Location: Back Pain Orientation: Mid;Lower Pain Descriptors / Indicators: Aching;Discomfort;Grimacing Pain Frequency: Constant Pain Onset: On-going Patients Stated Pain Goal: 4 Pain Intervention(s): Repositioned ADL: ADL ADL Comments: see Functional Assessment Tool  See Function Navigator for Current Functional Status.   Therapy/Group: Individual Therapy  Lewis, Seynabou Fults C 12/07/2015, 7:09 AM

## 2015-12-07 NOTE — Progress Notes (Signed)
Patient had large bm this am. Sorbitol not given. Continue with plan of care.  [name]

## 2015-12-07 NOTE — Consult Note (Signed)
Reason for Consult:CKD4, AKI Referring Physician: Dr. Sharl Ma ROZLYN YERBY is an 75 y.o. female.  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  , on basis of DM and HTN.Marland Kitchen Also hx of CAD, Carotid dz, renal stones, depression, GERD, melanoma of scalp, HOH, DJD, Glaucoma, Gout,  and DDD.  Admitted and had micro discectomy, on 5/19.  Now on rehab.Having severe pain, in back and Espinoza leg, and Cr 3.4., Cr 2.6 on admit.  Having prob with voiding.  Now screaming and not able to give ROS. Review of systems not obtained due to patient factors.    Past Medical History  Diagnosis Date  . Anemia   . Glaucoma   . Hypertension   . Blood transfusion   . GERD (gastroesophageal reflux disease)   . Arthritis   . Neuromuscular disorder (HCC)     CARPEL TUNNEL  . Atrial fibrillation (HCC)   . CAD (coronary artery disease)   . Carotid artery occlusion     Carotid Endartectom,y - left 2009.  Blockage Right being watched by Dr Edilia Bo.  Marland Kitchen HOH (hard of hearing)   . Shortness of breath   . Depression   . History of kidney stones     passed  . Carotid stenosis   . Complication of anesthesia     pt. states that she was difficult to wake  . Stroke (HCC)     hx of TIA  . 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 Cancer (HCC)     .  top of head- melonoma  . Dysrhythmia   . History of pneumonia   . Diabetes mellitus without complication (HCC)   . Myocardial infarction (HCC)   . History of hiatal hernia   . Pneumonia     Past Surgical History  Procedure Laterality Date  . Carpel tunnel    . Neck surgery    . Esophagogastroduodenoscopy  07/25/2011    Procedure: ESOPHAGOGASTRODUODENOSCOPY (EGD);  Surgeon: Vertell Novak., MD;  Location: [facility] ENDOSCOPY;  Service: Endoscopy;  Laterality: N/A;  . Colonoscopy  07/25/2011    Procedure: COLONOSCOPY;  Surgeon: Vertell Novak., MD;  Location: [facility] ENDOSCOPY;  Service: Endoscopy;  Laterality: N/A;  . Coronary artery bypass graft  07/29/2011     Procedure: CORONARY ARTERY BYPASS GRAFTING (CABG);  Surgeon: Alleen Borne, MD;  Location: [facility] OR;  Service: Open Heart Surgery;  Laterality: N/A;  . Carotid endarterectomy Left 2009     CEA  . Av fistula placement Left 05/31/2014    Procedure: Creation of Left Arm arteriovenous brachiocephalic Fistula;  Surgeon: Chuck Hint, MD;  Location: [facility] OR;  Service: Vascular;  Laterality: Left;  . Fistulogram Left 08/29/2014    Procedure: FISTULOGRAM;  Surgeon: Chuck Hint, MD;  Location: [facility] CATH LAB;  Service: Cardiovascular;  Laterality: Left;  . Av fistula placement Left 09/01/2014    Procedure: INSERTION OF ARTERIOVENOUS (AV) GORE-TEX GRAFT LEFT UPPER ARM USING  4-7 MM X 45 CM SRTETCH GORETEX GRAFT;  Surgeon: Chuck Hint, MD;  Location: [facility] OR;  Service: Vascular;  Laterality: Left;  . Cardiac catheterization    . Tonsillectomy    . Eye surgery    . Endarterectomy Right 04/21/2015    Procedure: ENDARTERECTOMY CAROTID;  Surgeon: Chuck Hint, MD;  Location: [facility] OR;  Service: Vascular;  Laterality: Right;  . Knee surgery Left   . Lumbar laminectomy/decompression microdiscectomy N/A 12/01/2015  Procedure: L5-S1 Decompression and Bilateral Microdiscectomy;  Surgeon: Marybelle Killings, MD;  Location: [place];  Service: Orthopedics;  Laterality: N/A;    Family History  Problem Relation Age of Onset  . Diabetes Mother   . Hypertension Mother   . Heart disease Mother     beofre age 85  . Heart attack Mother   . Stroke Mother   . Cancer Father   . Hyperlipidemia Father   . Hypertension Father   . Deep vein thrombosis Daughter   . Diabetes Daughter   . Hyperlipidemia Daughter   . Hypertension Daughter   . Heart disease Daughter   . Peripheral vascular disease Daughter     Social History:  reports that she has never smoked. She has never used smokeless tobacco. She reports that she does not drink alcohol or use illicit drugs.  Allergies:  Allergies  Allergen  Reactions  . Gabapentin Other (See Comments)    hallucinations  . Amlodipine     Other reaction(s): edema  . Other     Other reaction(s): Fatigue, Gout, Hallucintions  . Sulfa Antibiotics     Other reaction(s): psychiatric disturbances    Medications:  I have reviewed the patient's current medications. Prior to Admission:  Prescriptions prior to admission  Medication Sig Dispense Refill Last Dose  . acetaminophen (TYLENOL) 500 MG tablet Take 500 mg by mouth every 6 (six) hours as needed. For pain   Past Week at Unknown time  . allopurinol (ZYLOPRIM) 100 MG tablet Take 100 mg by mouth 2 (two) times daily.    12/01/2015 at Unknown time  . amLODipine (NORVASC) 10 MG tablet TAKE ONE TABLET BY MOUTH ONE TIME DAILY 30 tablet 9 12/01/2015 at Unknown time  . aspirin 325 MG tablet Take 325 mg by mouth daily.   11/30/2015 at Unknown time  . atorvastatin (LIPITOR) 10 MG tablet TAKE ONE TABLET BY MOUTH ONE TIME DAILY (Patient taking differently: Take 10 mg by mouth daily. ) 30 tablet 0 11/30/2015 at Unknown time  . B Complex Vitamins (VITAMIN B COMPLEX PO) Take 1 tablet by mouth daily.   Past Week at Unknown time  . brimonidine (ALPHAGAN P) 0.1 % SOLN Place 1 drop into the left eye 2 (two) times daily.   12/01/2015 at Unknown time  . calcitRIOL (ROCALTROL) 0.25 MCG capsule Take 0.25 mcg by mouth every other day.   11/30/2015 at Unknown time  . Cholecalciferol (VITAMIN D) 2000 UNITS tablet Take 2,000 Units by mouth 2 (two) times daily.   Past Week at Unknown time  . citalopram (CELEXA) 20 MG tablet Take 20 mg by mouth daily.   12/01/2015 at Unknown time  . colchicine 0.6 MG tablet Take 0.6 mg by mouth 2 (two) times daily as needed. gout   Past Month at Unknown time  . Cyanocobalamin (VITAMIN B 12 PO) Take 1,000 mg by mouth.   Past Week at Unknown time  . fish oil-omega-3 fatty acids 1000 MG capsule Take 2 g by mouth daily.   Past Week at Unknown time  . furosemide (LASIX) 40 MG tablet Take 80 mg by mouth 2  (two) times daily.    11/30/2015 at Unknown time  . HUMALOG 100 UNIT/ML injection Inject 0-12 Units into the skin 3 (three) times daily with meals. Sliding scale   12/01/2015 at Unknown time  . insulin glargine (LANTUS) 100 UNIT/ML injection Inject 20-30 Units into the skin at bedtime.    11/30/2015 at Unknown time  . linagliptin (  TRADJENTA) 5 MG TABS tablet Take 1 tablet (5 mg total) by mouth daily with breakfast. 30 tablet 1 11/30/2015 at Unknown time  . metoprolol (LOPRESSOR) 50 MG tablet Take 50 mg by mouth 2 (two) times daily.   12/01/2015 at 0700  . omeprazole (PRILOSEC) 20 MG capsule Take 20 mg by mouth daily as needed. Acid reflux   Past Week at Unknown time  . oxycodone (OXY-IR) 5 MG capsule Take 5 mg by mouth every 6 (six) hours as needed for pain.   12/01/2015 at Unknown time  . oxyCODONE-acetaminophen (ROXICET) 5-325 MG tablet Take 1 tablet by mouth every 4 (four) hours as needed for severe pain. 60 tablet 0   . timolol (TIMOPTIC) 0.5 % ophthalmic solution Place 1 drop into the left eye daily.   12/01/2015 at Unknown time  . travoprost, benzalkonium, (TRAVATAN) 0.004 % ophthalmic solution Place 1 drop into the left eye at bedtime.   12/01/2015 at Unknown time   , Calcitriol .25 mcg po. , Procrit at Short stay,   Results for orders placed or performed during the hospital encounter of 12/04/15 (from the past 48 hour(s))  Glucose, capillary     Status: Abnormal   Collection Time: 12/05/15  4:58 PM  Result Value Ref Range   Glucose-Capillary 187 (H) 65 - 99 mg/dL   Comment 1 Notify RN   Glucose, capillary     Status: Abnormal   Collection Time: 12/05/15  9:04 PM  Result Value Ref Range   Glucose-Capillary 190 (H) 65 - 99 mg/dL  Glucose, capillary     Status: Abnormal   Collection Time: 12/06/15  6:43 AM  Result Value Ref Range   Glucose-Capillary 140 (H) 65 - 99 mg/dL  CBC     Status: Abnormal   Collection Time: 12/06/15  6:51 AM  Result Value Ref Range   WBC 7.9 4.0 - 10.5 K/uL    RBC 2.56 (Espinoza) 3.87 - 5.11 MIL/uL   Hemoglobin 8.0 (Espinoza) 12.0 - 15.0 g/dL   HCT 24.5 (Espinoza) 36.0 - 46.0 %   MCV 95.7 78.0 - 100.0 fL   MCH 31.3 26.0 - 34.0 pg   MCHC 32.7 30.0 - 36.0 g/dL   RDW 15.1 11.5 - 15.5 %   Platelets 154 150 - 400 K/uL  Glucose, capillary     Status: Abnormal   Collection Time: 12/06/15 11:25 AM  Result Value Ref Range   Glucose-Capillary 167 (H) 65 - 99 mg/dL  Glucose, capillary     Status: Abnormal   Collection Time: 12/06/15  4:20 PM  Result Value Ref Range   Glucose-Capillary 170 (H) 65 - 99 mg/dL   Comment 1 Notify RN   Glucose, capillary     Status: Abnormal   Collection Time: 12/06/15  8:40 PM  Result Value Ref Range   Glucose-Capillary 159 (H) 65 - 99 mg/dL  Basic metabolic panel     Status: Abnormal   Collection Time: 12/07/15  5:21 AM  Result Value Ref Range   Sodium 132 (Espinoza) 135 - 145 mmol/Espinoza   Potassium 4.2 3.5 - 5.1 mmol/Espinoza   Chloride 98 (Espinoza) 101 - 111 mmol/Espinoza   CO2 21 (Espinoza) 22 - 32 mmol/Espinoza   Glucose, Bld 132 (H) 65 - 99 mg/dL   BUN 79 (H) 6 - 20 mg/dL   Creatinine, Ser 3.25 (H) 0.44 - 1.00 mg/dL   Calcium 9.3 8.9 - 10.3 mg/dL   GFR calc non Af Amer 13 (Espinoza) >60 mL/min  GFR calc Af Amer 15 (Espinoza) >60 mL/min    Comment: (NOTE) The eGFR has been calculated using the CKD EPI equation. This calculation has not been validated in all clinical situations. eGFR's persistently <60 mL/min signify possible Chronic Kidney Disease.    Anion gap 13 5 - 15  Glucose, capillary     Status: Abnormal   Collection Time: 12/07/15  6:54 AM  Result Value Ref Range   Glucose-Capillary 114 (H) 65 - 99 mg/dL  Glucose, capillary     Status: Abnormal   Collection Time: 12/07/15 11:54 AM  Result Value Ref Range   Glucose-Capillary 121 (H) 65 - 99 mg/dL    No results found.  ROS Blood pressure 97/53, pulse 67, temperature 97.6 F (36.4 C), temperature source Oral, resp. rate 16, weight 93.033 kg (205 lb 1.6 oz), SpO2 100 %. Physical Exam Physical Examination: General  appearance - uncooperative and screaming Ox3 Mental status - alert, oriented to person, place, and time, so anxious will not obey commands or cooperate but is coherent Eyes - pupils equal and reactive, extraocular eye movements intact Mouth - mucous membranes moist, pharynx normal without lesions Neck - adenopathy noted PCL Lymphatics - posterior cervical nodes Chest - decreased bs, no R.R or W Heart - normal rate, regular rhythm, normal S1, S2, no murmurs, rubs, clicks or gallops, S1 and S2 normal, systolic murmur KT6/2 at apex Abdomen - obese, striae, mod distension Neurological - moves all extrem, CN sym, will not coop Extremities - AVG LUA,  2-3+edema,  Skin - pale, distended in legs, seb kerratoses, striae  Assessment/Plan: 1 CKD 4 with AKI suspect multifactorial with obstruction, and now low bps.  Low bps from antiHTN, and narcotics.  Vol xs but low bps.  Hold off fluids/diuretics now.  Needs foley, stop bp meds, allow bp to rise. 2 Back surgery with ongoing pain per Rehab 3 Hypertension: low now stop meds 4. Anemia gvie Procrit 5. Metabolic Bone Disease: check PTH  Cont calcitriol 6 Obesity 7 DM 8  Gout eval 9  A fib 10  Carotid dz, stroke    P u/s, foley, stop bp meds, urine chem, check Uric Acid, give ESA.  [name] 12/07/2015, 2:51 PM

## 2015-12-08 ENCOUNTER — Inpatient Hospital Stay (HOSPITAL_COMMUNITY): Payer: Medicare Other | Admitting: Physical Therapy

## 2015-12-08 ENCOUNTER — Inpatient Hospital Stay (HOSPITAL_COMMUNITY): Payer: Medicare Other | Admitting: Occupational Therapy

## 2015-12-08 DIAGNOSIS — N179 Acute kidney failure, unspecified: Secondary | ICD-10-CM

## 2015-12-08 DIAGNOSIS — N184 Chronic kidney disease, stage 4 (severe): Secondary | ICD-10-CM

## 2015-12-08 LAB — COMPREHENSIVE METABOLIC PANEL
ALT: 6 U/L — AB (ref 14–54)
AST: 14 U/L — ABNORMAL LOW (ref 15–41)
Albumin: 2.5 g/dL — ABNORMAL LOW (ref 3.5–5.0)
Alkaline Phosphatase: 54 U/L (ref 38–126)
Anion gap: 10 (ref 5–15)
BILIRUBIN TOTAL: 0.8 mg/dL (ref 0.3–1.2)
BUN: 82 mg/dL — AB (ref 6–20)
CALCIUM: 8.8 mg/dL — AB (ref 8.9–10.3)
CHLORIDE: 102 mmol/L (ref 101–111)
CO2: 23 mmol/L (ref 22–32)
CREATININE: 3.08 mg/dL — AB (ref 0.44–1.00)
GFR, EST AFRICAN AMERICAN: 16 mL/min — AB (ref >60)
GFR, EST NON AFRICAN AMERICAN: 14 mL/min — AB (ref >60)
Glucose, Bld: 104 mg/dL — ABNORMAL HIGH (ref 65–99)
Potassium: 3.7 mmol/L (ref 3.5–5.1)
Sodium: 135 mmol/L (ref 135–145)
TOTAL PROTEIN: 6 g/dL — AB (ref 6.5–8.1)

## 2015-12-08 LAB — CBC
HCT: 25 % — ABNORMAL LOW (ref 36.0–46.0)
HEMOGLOBIN: 8.2 g/dL — AB (ref 12.0–15.0)
MCH: 30.8 pg (ref 26.0–34.0)
MCHC: 32.8 g/dL (ref 30.0–36.0)
MCV: 94 fL (ref 78.0–100.0)
Platelets: 188 10*3/uL (ref 150–400)
RBC: 2.66 MIL/uL — ABNORMAL LOW (ref 3.87–5.11)
RDW: 14.9 % (ref 11.5–15.5)
WBC: 8 10*3/uL (ref 4.0–10.5)

## 2015-12-08 LAB — GLUCOSE, CAPILLARY
GLUCOSE-CAPILLARY: 115 mg/dL — AB (ref 65–99)
GLUCOSE-CAPILLARY: 140 mg/dL — AB (ref 65–99)
GLUCOSE-CAPILLARY: 124 mg/dL — AB (ref 65–99)
Glucose-Capillary: 162 mg/dL — ABNORMAL HIGH (ref 65–99)

## 2015-12-08 LAB — PHOSPHORUS: PHOSPHORUS: 5.5 mg/dL — AB (ref 2.5–4.6)

## 2015-12-08 LAB — PARATHYROID HORMONE, INTACT (NO CA): PTH: 84 pg/mL — ABNORMAL HIGH (ref 15–65)

## 2015-12-08 MED ORDER — ACETAMINOPHEN 325 MG PO TABS
650.0000 mg | ORAL_TABLET | Freq: Four times a day (QID) | ORAL | Status: DC | PRN
  Administered 2015-12-08 – 2015-12-15 (×4): 650 mg via ORAL
  Filled 2015-12-08 (×6): qty 2

## 2015-12-08 MED ORDER — SODIUM CHLORIDE 0.9 % IV SOLN
510.0000 mg | Freq: Once | INTRAVENOUS | Status: AC
  Administered 2015-12-08: 510 mg via INTRAVENOUS
  Filled 2015-12-08: qty 17

## 2015-12-08 NOTE — Progress Notes (Signed)
Occupational Therapy Session Note  Patient Details  Name: [name] MRN: [id number] Date of Birth: August 18, 1940  Today's Date: 12/08/2015 OT Individual Time: 1430-1500 OT Individual Time Calculation (min): 30 min    Short Term Goals: Week 1:  OT Short Term Goal 1 (Week 1): Pt will complete lower body dressing using AE to maintain back precatuions 100% of the time OT Short Term Goal 2 (Week 1): Pt will complete toilet hygiene independently OT Short Term Goal 3 (Week 1): Pt will bathe, sitting and standing with steadying assist OT Short Term Goal 4 (Week 1): Pt will demo ability to perform light home making task using RW with min assist  Skilled Therapeutic Interventions/Progress Updates:    Pt seen for OT session focusing on functional transfers. Pt sitting up in recliner upon arrival, requiring encouragement for participation as pt voicing LE pain and fatigue. She stood with mod A and ambulated with CGA to w/c with RW. In ADL apartment, completed simulated shower stall transfer. Following demonstration for technique, pt completed transfer with min A using RW and grab bar. Pt does not have grab bar at home currently. Recommended placing grab bars. Will need to cont to practice to reach supervision goal level.  Pt very limited by pain and anxiousness with all mobility, requiring VCs throughout for deep breathing in order to self soother. She returned to room at end of session and requested return to bed. Mod A and verbal/ tactile cues required for log rolling technique back to bed. Left in supine with bed alarm on and all needs in reach. Husband present throughout session and left in room with pt.   Therapy Documentation Precautions:  Precautions Precautions: Back, Fall Precaution Booklet Issued: No Precaution Comments: 2 of 3 BAT recalled during OT eval Restrictions Weight Bearing Restrictions: No Pain: Pain Assessment Pain Assessment: 0-10 Pain Score: 8  Pain Type: Chronic  pain Pain Location: Back Pain Orientation: Lower Pain Descriptors / Indicators: Aching Pain Frequency: Constant Pain Onset: On-going Patients Stated Pain Goal: 2 Pain Intervention(s): Repositioned; increased activity Multiple Pain Sites: No ADL: ADL ADL Comments: see Functional Assessment Tool  See Function Navigator for Current Functional Status.   Therapy/Group: Individual Therapy  Lewis, Arrin Ishler C 12/08/2015, 3:30 PM

## 2015-12-08 NOTE — Progress Notes (Signed)
[facility] PHYSICAL MEDICINE & REHABILITATION     PROGRESS NOTE    Subjective/Complaints: Patient seen sleeping comfortably in bed this morning. However, after waking up patient states "I didn't sleep at all". She states that she has pain all over.  ROS: + Generalized pain. Denies CP, SOB, nausea, vomiting, diarrhea.  Objective: Vital Signs: Blood pressure 165/53, pulse 81, temperature 99.4 F (37.4 C), temperature source Oral, resp. rate 18, weight 93.033 kg (205 lb 1.6 oz), SpO2 93 %. US Renal  12/07/2015  CLINICAL DATA:  75 year old female with acute kidney injury on chronic kidney disease. EXAM: RENAL / URINARY TRACT ULTRASOUND COMPLETE COMPARISON:  None. FINDINGS: Right Kidney: Length: 12 cm. Cortical atrophy noted with increased renal echogenicity. There is no evidence of hydronephrosis or solid mass. A 1.2 cm cyst is present. Left Kidney: Length: 10.6 cm. Cortical atrophy noted with increased renal echogenicity. There is no evidence of hydronephrosis or solid mass. Bladder: A Foley catheter is present within a collapsed bladder. IMPRESSION: Bilateral renal cortical atrophy and increased renal echogenicity compatible with medical renal disease. No evidence of hydronephrosis. Electronically Signed   By: Margarette Canada M.D.   On: 12/07/2015 16:45    Recent Labs  12/06/15 0651 12/08/15 0415  WBC 7.9 8.0  HGB 8.0* 8.2*  HCT 24.5* 25.0*  PLT 154 188    Recent Labs  12/07/15 0521 12/08/15 0415  NA 132* 135  K 4.2 3.7  CL 98* 102  GLUCOSE 132* 104*  BUN 79* 82*  CREATININE 3.25* 3.08*  CALCIUM 9.3 8.8*   CBG (last 3)   Recent Labs  12/07/15 1642 12/07/15 2026 12/08/15 0642  GLUCAP 129* 124* 115*    Wt Readings from Last 3 Encounters:  12/07/15 93.033 kg (205 lb 1.6 oz)  12/01/15 86.183 kg (190 lb)  11/23/15 86.274 kg (190 lb 3.2 oz)    Physical Exam:   Nursing note and vitals reviewed. Constitutional: She appears well-developed and well-nourished. NAD.  HENT:  Normocephalic and atraumatic.  Mouth/Throat: Oropharynx is clear and moist.  Eyes: Conjunctivae and EOM are normal.  Cardiovascular: Normal rate and regular rhythm.  Respiratory: Effort normal. No stridor. She has wheezes (expiratory wheezes).  GI: Soft. Nontender. Bowel sounds normal.  Musculoskeletal: She exhibits edema (1+ pedal edema and B-tibially) and tenderness.  Positive thrill LUE graft  Neurological: She is alert and oriented to person, place, and time.  Patient is very hard of hearing.  Motor: B/l UE 4+/5 proximal to distal B/l LE: Hip flexion, knee extension 4-/5, ankle dorsi/plantar flexion 4/5  Skin: Skin is warm and dry.  Skin: Skin is warm and dry. No rash noted.  Back incision clean and dry with steristrps and foam dressing Psychiatric: She has a normal mood and affect. Her behavior is anxious  .  Assessment/Plan: 1. 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 Physiatrist is providing close team supervision and 24 hour management of active medical problems listed below. Physiatrist and rehab team continue to assess barriers to discharge/monitor patient progress toward functional and medical goals.  Function:  Bathing Bathing position Bathing activity did not occur: Refused Position: Shower  Bathing parts Body parts bathed by patient: Right arm, Left arm, Chest, Abdomen, Front perineal area, Right upper leg, Left upper leg, Right lower leg, Left lower leg Body parts bathed by helper: Buttocks, Back  Bathing assist Assist Level:  Touching or steadying assistance(Pt > 75%)      Upper Body Dressing/Undressing Upper body dressing   What is the patient wearing?: Pull over shirt/dress     Pull over shirt/dress - Perfomed by patient: Thread/unthread right sleeve, Thread/unthread left sleeve, Put head through  opening, Pull shirt over trunk Pull over shirt/dress - Perfomed by helper: Pull shirt over trunk        Upper body assist Assist Level: Set up   Set up : To obtain clothing/put away  Lower Body Dressing/Undressing Lower body dressing   What is the patient wearing?: Pants, Non-skid slipper socks, Underwear, [employer]  - Performed by patient: Thread/unthread right underwear leg, Thread/unthread left underwear leg, Pull underwear up/down Underwear - Performed by helper: Thread/unthread right underwear leg, Thread/unthread left underwear leg Pants- Performed by patient: Pull pants up/down Pants- Performed by helper: Thread/unthread right pants leg, Thread/unthread left pants leg Non-skid slipper socks- Performed by patient: Don/doff right sock, Don/doff left sock Non-skid slipper socks- Performed by helper: Don/doff right sock, Don/doff left sock               TED Hose - Performed by helper: Don/doff right TED hose, Don/doff left TED hose  Lower body assist Assist for lower body dressing: Touching or steadying assistance (Pt > 75%)      Toileting Toileting   Toileting steps completed by patient: Performs perineal hygiene, Adjust clothing after toileting Toileting steps completed by helper: Adjust clothing prior to toileting Toileting Assistive Devices: Prosthesis/orthosis  Toileting assist Assist level: Set up/obtain supplies   Transfers Chair/bed transfer   Chair/bed transfer method: Stand pivot Chair/bed transfer assist level: Maximal assist (Pt 25 - 49%/lift and lower) Chair/bed transfer assistive device: Walker, Air cabin crew     Max distance: 46ft Assist level: Touching or steadying assistance (Pt > 75%)   Wheelchair   Type: Manual Max wheelchair distance: 68ft Assist Level: Supervision or verbal cues  Cognition Comprehension Comprehension assist level: Follows basic conversation/direction with extra time/assistive device  Expression  Expression assist level: Expresses basic needs/ideas: With no assist  Social Interaction Social Interaction assist level: Interacts appropriately 90% of the time - Needs monitoring or encouragement for participation or interaction.  Problem Solving Problem solving assist level: Solves basic 90% of the time/requires cueing < 10% of the time  Memory Memory assist level: Recognizes or recalls 90% of the time/requires cueing < 10% of the time   Medical Problem List and Plan: 1. 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  -continue CIR therapies  -provided positive reinforcement about prognosis/pain 2. DVT Prophylaxis/Anticoagulation: Pharmaceutical: Lovenox 3. Pain Management: pain improved today  -oxycodone increased to 10mg . Scheduled robaxin to help with muscle spasms and low dose ultram 50mg  qid  -. K pad additionally for local measures.  4. Mood: Needs a lot of encouragement and ego support as is self limiting.   -anxiety a major issue. Added prn xanax.   -request neuropsych.  5. Neuropsych: This patient is capable of making decisions on her own behalf. 6. Skin/Wound Care: Monitor wound daily for healing 7. Fluids/Electrolytes/Nutrition: Monitor I/O.   BMP stable on 5/26 8. T2DM: Will monitor BS ac/hs. Continue lantus at bedtime with trajenta in am. Continue SSI for elevated BS.  9. CAD: Continue lopressor and lipitor.  10. Wheezing: denies increase in chronic SOB.  CXR without acute disease. 12. CKD with persistent BLE edema: (baseline around 3?)  -Lasix  being held per nephrology   -Appreciate nephrology consult and recs. Now BP appears to be elevated, will defer to nephro for management  -Renal ultrasound reviewed on 5/25 suggesting medical renal disease 13. Abdominal pain: KUB negative. Continue to work on regular bowel schedule.  Good bowel movements on 5/25 14. Urinary retention:ua neg, cx negative Monitor  PVRs and cath for volumes > 350cc  15. Acute on chronic anemia due to CKD: has had iron and epo? injections in the past.  16. H/o gout: off colchicine  17. Thrombocytopenia: Cont to monitor, improving 18. Hyponatremia: Cont to monitor 19. AFib: Monitor HR with increased activity 20. HTN: Monitor with increased activity  LOS (Days) 4 A FACE TO FACE EVALUATION WAS PERFORMED  Nashon Erbes Lorie Phenix 12/08/2015 9:48 AM

## 2015-12-08 NOTE — Progress Notes (Signed)
Social Work  Social Work Assessment and Plan  Patient Details  Name: [name] MRN: [id number] Date of Birth: 05-09-1941  Today's Date: 12/05/2015  Problem List:  Patient Active Problem List   Diagnosis Date Noted  . AKI (acute kidney injury) ([place])   . Lumbar back pain with radiculopathy affecting left lower extremity 12/04/2015  . Bilateral calf pain   . Pain   . Wheezing   . Abnormality of gait   . Radiculopathy of lumbosacral region   . Post-operative pain   . Type 2 diabetes mellitus with peripheral neuropathy (HCC)   . Coronary artery disease involving native coronary artery of native heart without angina pectoris   . CKD (chronic kidney disease)   . AP (abdominal pain)   . Urinary retention   . Anemia of chronic disease   . Acute blood loss anemia   . Hx of gout   . Thrombocytopenia ([place])   . Hyponatremia   . Persistent atrial fibrillation ([place])   . Benign essential HTN   . S/P lumbar discectomy 12/01/2015  . 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 Acute delirium   . Acute encephalopathy   . Chronic kidney disease (CKD), stage V ([place]) 07/13/2015  . 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 Hypertension   . Glaucoma   . GERD (gastroesophageal reflux disease)   . Atrial fibrillation ([place])   . CAD (coronary artery disease)   . Depression   . Carotid stenosis   . Stroke ([place])   . Diabetes mellitus without complication ([place])   . Gastroesophageal reflux disease with esophagitis   . Carotid artery stenosis 04/21/2015  . Coronary arteriosclerosis 06/08/2014  . Essential hypertension 06/08/2014  . Hyperlipidemia 06/08/2014  . End stage renal disease ([place]) 05/18/2014  . Occlusion and stenosis of carotid artery without mention of cerebral infarction 01/01/2012   Past Medical History:  Past Medical History  Diagnosis Date  . Anemia   . Glaucoma   . Hypertension   . Blood transfusion    . GERD (gastroesophageal reflux disease)   . Arthritis   . Neuromuscular disorder ([place])     CARPEL TUNNEL  . Atrial fibrillation ([place])   . CAD (coronary artery disease)   . Carotid artery occlusion     Carotid Endartectom,y - left 2009.  Blockage Right being watched by Dr Scot Dock.  Marland Kitchen HOH (hard of hearing)   . Shortness of breath   . Depression   . History of kidney stones     passed  . Carotid stenosis   . Complication of anesthesia     pt. states that she was difficult to wake  . Stroke ([place])     hx of TIA  . 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 Cancer ([place])     .  top of head- melonoma  . Dysrhythmia   . History of pneumonia   . Diabetes mellitus without complication ([place])   . Myocardial infarction ([place])   . History of hiatal hernia   . Pneumonia    Past Surgical History:  Past Surgical History  Procedure Laterality Date  . Carpel tunnel    . Neck surgery    . Esophagogastroduodenoscopy  07/25/2011    Procedure: ESOPHAGOGASTRODUODENOSCOPY (EGD);  Surgeon: Winfield Cunas., MD;  [facility] ENDOSCOPY;  Service: Endoscopy;  Laterality: N/A;  . Colonoscopy  07/25/2011    Procedure: COLONOSCOPY;  Surgeon: Winfield Cunas., MD;  Location: [facility] ENDOSCOPY;  Service: Endoscopy;  Laterality: N/A;  . Coronary artery bypass graft  07/29/2011    Procedure: CORONARY ARTERY BYPASS GRAFTING (CABG);  Surgeon: Gaye Pollack, MD;  Location: [place];  Service: Open Heart Surgery;  Laterality: N/A;  . Carotid endarterectomy Left 2009     CEA  . Av fistula placement Left 05/31/2014    Procedure: Creation of Left Arm arteriovenous brachiocephalic Fistula;  Surgeon: Angelia Mould, MD;  Location: [place];  Service: Vascular;  Laterality: Left;  . Fistulogram Left 08/29/2014    Procedure: FISTULOGRAM;  Surgeon: Angelia Mould, MD;  Location: [facility] CATH LAB;  Service: Cardiovascular;  Laterality: Left;  . Av fistula placement Left 09/01/2014    Procedure: INSERTION  OF ARTERIOVENOUS (AV) GORE-TEX GRAFT LEFT UPPER ARM USING  4-7 MM X 45 CM SRTETCH GORETEX GRAFT;  Surgeon: Angelia Mould, MD;  Location: [place];  Service: Vascular;  Laterality: Left;  . Cardiac catheterization    . Tonsillectomy    . Eye surgery    . Endarterectomy Right 04/21/2015    Procedure: ENDARTERECTOMY CAROTID;  Surgeon: Angelia Mould, MD;  Location: [place];  Service: Vascular;  Laterality: Right;  . Knee surgery Left   . Lumbar laminectomy/decompression microdiscectomy N/A 12/01/2015    Procedure: L5-S1 Decompression and Bilateral Microdiscectomy;  Surgeon: Marybelle Killings, MD;  Location: [facility] [place];  Service: Orthopedics;  Laterality: N/A;   Social History:  reports that she has never smoked. She has never used smokeless tobacco. She reports that she does not drink alcohol or use illicit drugs.  Family / Support Systems Marital Status: Married Patient Roles: Spouse, Parent Spouse/Significant Other: Truc Seeton @ [phone] Children: daughter, Bryan Lemma @ [phone] (she and her family live "right behing [place]") Anticipated Caregiver: Husband, daughter Ability/Limitations of Caregiver: Husband can assist.  Dtr and granddaughters live close by. Caregiver Availability: 24/7 Family Dynamics: Pt describes very close family, relationship.  Denies any concerns about support upon d/c.  Social History Preferred language: English Religion: Methodist Cultural Background: NA Education: HS Read: Yes Write: Yes Employment Status: Retired Freight forwarder Issues: None Guardian/Conservator: None - per MD, pt is capable of making decisions on her own behalf.   Abuse/Neglect Physical Abuse: Denies Verbal Abuse: Denies Sexual Abuse: Denies Exploitation of patient/patient's resources: Denies Self-Neglect: Denies  Emotional Status Pt's affect, behavior adn adjustment status: Pt sitting up in w/c and reports much fatigue from therapies.  Very pleasant and able to complete  assessment interview without difficulty.  Husband present and very pleasant.  Pt laughs as she questions if surgery "was a good idea" but agrees that she will need to allow herself time to recover.  She denies any significant s/s of emotional distress.  Will monitor. Recent Psychosocial Issues: None Pyschiatric History: None Substance Abuse History: NOne  Patient / Family Perceptions, Expectations & Goals Pt/Family understanding of illness & functional limitations: Pt and family with general understanding of the surgery and her current functional limitations/ need for CIR. Premorbid pt/family roles/activities: Pt was independent overall  Anticipated changes in roles/activities/participation: Little change anticipated if able to reach supervision levels as spouse was assisting as needed PTA Pt/family expectations/goals: "I just want to feel better."  [employer]: None Premorbid Home Care/DME Agencies: Other (Comment) ([facility] in past) Transportation available at discharge: yes  Discharge Planning Living Arrangements: Spouse/significant other Support Systems: Spouse/significant other, Children Insurance Resources: [employer], Multimedia programmer (specify) Nurse, mental health)  Financial Resources: Radio broadcast assistant Screen Referred: No Living Expenses: Own Money Management: Spouse Does the patient have any problems obtaining your medications?: No Home Management: pt and spouse Patient/Family Preliminary Plans: Pt to return home with her husband and family providing any assistance needed. Social Work Anticipated Follow Up Needs: HH/OP Expected length of stay: 10-12 days  Clinical Impression Very pleasant woman here following back surgery and husband at bedside/ supportive.  She c/o fatigue from therapies but hopeful she will be able to regain her strength.  She denies any s/s of emotional distress but will monitor.  Will follow for support and d/c planning needs.  Willmar Stockinger,  Aliviah Spain 12/05/2015, 4:19 PM

## 2015-12-08 NOTE — Progress Notes (Signed)
Patient ID: [name], female   DOB: 08-Sep-1940, 75 y.o.   MRN: [id number]  [facility] Progress Note   Assessment/ Plan:   1. AKI on CKD IV (baseline Cr 2.4-2.6): multifactorial with some retention and intermittent relative hypotension-- with fair UOP overnight and some improvement in renal fucntion 2. Hypertension: some elevation of blood pressure noted this morning after stopping diuretic and amlodipine yesterday to improve renal perfusion. Continue current management and will attempt to restart diuretic tomorrow. 3. S/p Microdiskectomy for L5/S1 radiculopathy: ongoing effort at in-patient rehabilitation that appear to be affected by her pain and narcotic associated somnolence/constipation 4. Anemia: Low iron stores--replace with IV Fe and start ESA  Subjective:   Reports to be feeling fair--complains of pain and poor sleep last night   Objective:   BP 165/53 mmHg  Pulse 81  Temp(Src) 99.4 F (37.4 C) (Oral)  Resp 18  Wt 93.033 kg (205 lb 1.6 oz)  SpO2 93%  Intake/Output Summary (Last 24 hours) at 12/08/15 1127 Last data filed at 12/08/15 0428  Gross per 24 hour  Intake      0 ml  Output   1600 ml  Net  -1600 ml   Weight change:   Physical Exam: [id number] sitting in wheelchair [id number] RRR, S1 and S2 with 3/6 HSM Resp:Decreased breath sounds over bases bilaterally, no rales/rhonchi [id number], obese, NT, BS normal Ext:2-3+ LE edema  Imaging: US Renal  12/07/2015  CLINICAL DATA:  75 year old female with acute kidney injury on chronic kidney disease. EXAM: RENAL / URINARY TRACT ULTRASOUND COMPLETE COMPARISON:  None. FINDINGS: Right Kidney: Length: 12 cm. Cortical atrophy noted with increased renal echogenicity. There is no evidence of hydronephrosis or solid mass. A 1.2 cm cyst is present. Left Kidney: Length: 10.6 cm. Cortical atrophy noted with increased renal echogenicity. There is no evidence of hydronephrosis or solid mass. Bladder: A Foley  catheter is present within a collapsed bladder. IMPRESSION: Bilateral renal cortical atrophy and increased renal echogenicity compatible with medical renal disease. No evidence of hydronephrosis. Electronically Signed   By: Margarette Canada M.D.   On: 12/07/2015 16:45    Labs: BMET  Recent Labs Lab 12/02/15 0531 12/03/15 0502 12/04/15 0316 12/05/15 0450 12/07/15 0521 12/08/15 0415  NA 136 132* 134* 133* 132* 135  K 3.5 3.7 3.6 3.8 4.2 3.7  CL 101 99* 102 100* 98* 102  CO2 24 22 21* 20* 21* 23  GLUCOSE 87 170* 167* 149* 132* 104*  BUN 62* 63* 66* 69* 79* 82*  CREATININE 2.71* 3.07* 3.00* 3.04* 3.25* 3.08*  CALCIUM 8.2* 8.3* 8.5* 8.7* 9.3 8.8*  PHOS  --   --   --   --   --  5.5*   CBC  Recent Labs Lab 12/04/15 0316 12/05/15 0450 12/06/15 0651 12/08/15 0415  WBC 9.1 8.9 7.9 8.0  NEUTROABS  --  6.1  --   --   HGB 8.4* 8.2* 8.0* 8.2*  HCT 25.9* 25.2* 24.5* 25.0*  MCV 94.9 94.4 95.7 94.0  PLT 133* 145* 154 188    Medications:    . allopurinol  100 mg Oral BID  . atorvastatin  10 mg Oral Daily  . brimonidine  1 drop Both Eyes BID  . calcitRIOL  0.25 mcg Oral QODAY  . citalopram  20 mg Oral Daily  . docusate sodium  100 mg Oral BID  . enoxaparin (LOVENOX) injection  30 mg Subcutaneous Q24H  . insulin aspart  0-15 Units Subcutaneous TID WC  .  insulin glargine  20 Units Subcutaneous QHS  . latanoprost  1 drop Left Eye QHS  . lidocaine  1 patch Transdermal Q24H  . linagliptin  5 mg Oral Q breakfast  . methocarbamol  500 mg Oral QID  . metoprolol  50 mg Oral BID  . MUSCLE RUB   Topical BID  . polyethylene glycol  17 g Oral BID  . timolol  1 drop Left Eye Daily  . traMADol  50 mg Oral TID PC & HS   Elmarie Shiley, MD 12/08/2015, 11:27 AM

## 2015-12-08 NOTE — Progress Notes (Signed)
Physical Therapy Session Note  Patient Details  Name: [name] MRN: [id number] Date of Birth: 1941-07-08  Today's Date: 12/08/2015 PT Individual Time: 1300-1400 PT Individual Time Calculation (min): 60 min   Short Term Goals: Week 1:  PT Short Term Goal 1 (Week 1): Pt will perform bed mobility on flat bed, no rails with mod A and recall back precautions with 50% cues PT Short Term Goal 2 (Week 1): Pt will perform sit<> stand and stand pivot transfers with RW and min A consistently with 50% cues for safety and sequencing PT Short Term Goal 3 (Week 1): Pt will perform w/c mobility x 50' in controlled environment with supervision PT Short Term Goal 4 (Week 1): Pt will perform 4 stair negotiation with bilat rails and mod A PT Short Term Goal 5 (Week 1): Pt will ambulate with RW x 50' with min A and reporting pain at <5/10.  Skilled Therapeutic Interventions/Progress Updates:    Pt received seated in w/c, c/o 10/10 pain in low back and L hip however agreeable to treatment. Gait x50' with RW and min guard. Stand pivot transfer w/c <>bed in ADL apartment with RW and min guard. Sit <>supine x2 trials with modA decreased to minA for assisting LE's into bed. Pt able to accurately name 2/3 BAT back precautions. All activities require increased time and max encouragement due to anxiety, kinesiophobia, hyperventilation. Cueing for pursed lip breathing to decrease RR, anxiety and pain perception. Gait x15' in hallway with RW and close S; pt reported needing to stop after 6-7' however able to continue when encouraged. Gait within room to return to recliner with RW and min guard. Remained seated in recliner at completion of session, all needs within reach.   Therapy Documentation Precautions:  Precautions Precautions: Back, Fall Precaution Booklet Issued: No Precaution Comments: 2 of 3 BAT recalled during OT eval Restrictions Weight Bearing Restrictions: No Pain: Pain Assessment Pain Assessment:  0-10 Pain Score: 8  Pain Type: Chronic pain Pain Location: Back Pain Orientation: Lower Pain Descriptors / Indicators: Aching Pain Frequency: Constant Pain Onset: On-going Patients Stated Pain Goal: 2 Pain Intervention(s): Medication (See eMAR) Multiple Pain Sites: No   See Function Navigator for Current Functional Status.   Therapy/Group: Individual Therapy  Luberta Mutter 12/08/2015, 2:43 PM

## 2015-12-08 NOTE — Progress Notes (Signed)
Occupational Therapy Session Note  Patient Details  Name: [name] MRN: [id number] Date of Birth: [birth year]  Today's Date: 12/08/2015 OT Individual Time: 1030-1100 OT Individual Time Calculation (min): 30 min    Short Term Goals: Week 1:  OT Short Term Goal 1 (Week 1): Pt will complete lower body dressing using AE to maintain back precatuions 100% of the time OT Short Term Goal 2 (Week 1): Pt will complete toilet hygiene independently OT Short Term Goal 3 (Week 1): Pt will bathe, sitting and standing with steadying assist OT Short Term Goal 4 (Week 1): Pt will demo ability to perform light home making task using RW with min assist  Skilled Therapeutic Interventions/Progress Updates:    Pt worked on dressing techniques using AE.  Pt used reacher to don underwear over her feet with extra time and cues and min A to guide pants over left foot. She was having difficulty with the reacher due to new R elbow pain.  Cued pt to use L hand more and to not bear weight on R elbow so much.  Sit to stands with mod A to stand up and guiding A to sit. Supervision in standing to pull pants up. Pt resting in w/c with all needs met.    Therapy Documentation Precautions:  Precautions Precautions: Back, Fall Precaution Booklet Issued: No Precaution Comments: 2 of 3 BAT recalled during OT eval Restrictions Weight Bearing Restrictions: No  Pain: Pain Assessment Pain Assessment: 0-10 Pain Score: 6  Pain Type: Chronic pain Pain Location: Back Pain Orientation: Lower Pain Descriptors / Indicators: Aching Pain Frequency: Constant Pain Onset: On-going Patients Stated Pain Goal: 2 Pain Intervention(s): RN made aware Multiple Pain Sites: No ADL: ADL ADL Comments: see Functional Assessment Tool  See Function Navigator for Current Functional Status.   Therapy/Group: Individual Therapy  [place] 12/08/2015, 12:24 PM

## 2015-12-08 NOTE — Progress Notes (Signed)
Occupational Therapy Session Note  Patient Details  Name: [name] MRN: [id number] Date of Birth: 12-Jun-1941  Today's Date: 12/08/2015 OT Individual Time: 0845-1000 OT Individual Time Calculation (min): 75 min    Short Term Goals: Week 1:  OT Short Term Goal 1 (Week 1): Pt will complete lower body dressing using AE to maintain back precatuions 100% of the time OT Short Term Goal 2 (Week 1): Pt will complete toilet hygiene independently OT Short Term Goal 3 (Week 1): Pt will bathe, sitting and standing with steadying assist OT Short Term Goal 4 (Week 1): Pt will demo ability to perform light home making task using RW with min assist  Skilled Therapeutic Interventions/Progress Updates:    Session One: Pt seen for OT ADL bathing/dressing session. Pt sitting EOB upon arrival, voicing increased pain in L hip and knee. Multiple attempts required to stand from EOB with max A and max cuing for technique and weight shift. Cuing required throughout all attempts for deep breathing as pt with short shallow breathes.  RN made aware of pt's pain level and medication administered.  She completed stand pivot to w/c and then w/c > tub transfer bench. She bathed seated on bench with increased assist for sit <> stand at grab bars compared to previous sessions. She used LH sponge for LB bathing with cuing. She completed grooming tasks with set-up assist from w/c level.  TED hose and socks donned total A. She wore hospital gown with plans to get dressed during next OT session 30 minutes from the completion of this session.  Rest breaks required throughout session due to decreased activity tolerance and pain.  Pt left sitting in w/c with all needs in reach and husband present.   Therapy Documentation Precautions:  Precautions Precautions: Back, Fall Precaution Booklet Issued: No Precaution Comments: 2 of 3 BAT recalled during OT eval Restrictions Weight Bearing Restrictions: No Pain: Pain  Assessment Pain Assessment: 0-10 Pain Score: 8   RN made aware; shower; repositioned ADL: ADL ADL Comments: see Functional Assessment Tool  See Function Navigator for Current Functional Status.   Therapy/Group: Individual Therapy  Lewis, Anjelita Sheahan C 12/08/2015, 9:45 AM

## 2015-12-09 ENCOUNTER — Inpatient Hospital Stay (HOSPITAL_COMMUNITY): Payer: Medicare Other

## 2015-12-09 LAB — GLUCOSE, CAPILLARY
GLUCOSE-CAPILLARY: 129 mg/dL — AB (ref 65–99)
Glucose-Capillary: 97 mg/dL (ref 65–99)
Glucose-Capillary: 161 mg/dL — ABNORMAL HIGH (ref 65–99)
Glucose-Capillary: 123 mg/dL — ABNORMAL HIGH (ref 65–99)

## 2015-12-09 LAB — BASIC METABOLIC PANEL
Anion gap: 16 — ABNORMAL HIGH (ref 5–15)
BUN: 83 mg/dL — AB (ref 6–20)
CALCIUM: 8.8 mg/dL — AB (ref 8.9–10.3)
CHLORIDE: 95 mmol/L — AB (ref 101–111)
CO2: 19 mmol/L — AB (ref 22–32)
CREATININE: 3.06 mg/dL — AB (ref 0.44–1.00)
GFR calc non Af Amer: 14 mL/min — ABNORMAL LOW (ref >60)
GFR, EST AFRICAN AMERICAN: 16 mL/min — AB (ref >60)
Glucose, Bld: 144 mg/dL — ABNORMAL HIGH (ref 65–99)
Potassium: 4 mmol/L (ref 3.5–5.1)
Sodium: 130 mmol/L — ABNORMAL LOW (ref 135–145)

## 2015-12-09 LAB — OCCULT BLOOD X 1 CARD TO LAB, STOOL: FECAL OCCULT BLD: NEGATIVE

## 2015-12-09 NOTE — Plan of Care (Signed)
Problem: RH BLADDER ELIMINATION Goal: RH STG MANAGE BLADDER WITH ASSISTANCE STG Manage Bladder With Assistance. Mod I  Outcome: Not Progressing Foley cath intact due to urinary retention

## 2015-12-09 NOTE — Progress Notes (Signed)
[place] PHYSICAL MEDICINE & REHABILITATION     PROGRESS NOTE    Subjective/Complaints: No problems overnite but has elbow pain mainly when rising  ROS: + Generalized pain. Denies CP, SOB, nausea, vomiting, diarrhea.  Objective: Vital Signs: Blood pressure 142/57, pulse 68, temperature 98.2 F (36.8 C), temperature source Oral, resp. rate 18, weight 93.622 kg (206 lb 6.4 oz), SpO2 94 %. US Renal  12/07/2015  CLINICAL DATA:  75 year old female with acute kidney injury on chronic kidney disease. EXAM: RENAL / URINARY TRACT ULTRASOUND COMPLETE COMPARISON:  None. FINDINGS: Right Kidney: Length: 12 cm. Cortical atrophy noted with increased renal echogenicity. There is no evidence of hydronephrosis or solid mass. A 1.2 cm cyst is present. Left Kidney: Length: 10.6 cm. Cortical atrophy noted with increased renal echogenicity. There is no evidence of hydronephrosis or solid mass. Bladder: A Foley catheter is present within a collapsed bladder. IMPRESSION: Bilateral renal cortical atrophy and increased renal echogenicity compatible with medical renal disease. No evidence of hydronephrosis. Electronically Signed   By: Margarette Canada M.D.   On: 12/07/2015 16:45    Recent Labs  12/08/15 0415  WBC 8.0  HGB 8.2*  HCT 25.0*  PLT 188    Recent Labs  12/07/15 0521 12/08/15 0415  NA 132* 135  K 4.2 3.7  CL 98* 102  GLUCOSE 132* 104*  BUN 79* 82*  CREATININE 3.25* 3.08*  CALCIUM 9.3 8.8*   CBG (last 3)   Recent Labs  12/08/15 1650 12/08/15 2050 12/09/15 0705  GLUCAP 140* 124* 97    Wt Readings from Last 3 Encounters:  12/09/15 93.622 kg (206 lb 6.4 oz)  12/01/15 86.183 kg (190 lb)  11/23/15 86.274 kg (190 lb 3.2 oz)    Physical Exam:   Nursing note and vitals reviewed. Constitutional: She appears well-developed and well-nourished. NAD.  HENT: Normocephalic and atraumatic.  Mouth/Throat: Oropharynx is clear and moist.  Eyes: Conjunctivae and EOM are normal.   Cardiovascular: Normal rate and regular rhythm.  Respiratory: Effort normal. No stridor. She has wheezes (expiratory wheezes).  GI: Soft. Nontender. Bowel sounds normal.  Musculoskeletal: She exhibits edema (1+ pedal edema and B-tibially) and tenderness. Pain with R elbow ROM, lascks end range flexion and ext    Neurological: She is alert and oriented to person, place, and time.  Patient is very hard of hearing.  Motor: B/l UE 4+/5 proximal to distal B/l LE: Hip flexion, knee extension 4-/5, ankle dorsi/plantar flexion 4/5  Skin: Skin is warm and dry.  Skin: Skin is warm and dry. No rash noted.  Back incision clean and dry with steristrps and foam dressing Psychiatric: She has a normal mood and affect. Her behavior is anxious  .  Assessment/Plan: 1. 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 Physiatrist is providing close team supervision and 24 hour management of active medical problems listed below. Physiatrist and rehab team continue to assess barriers to discharge/monitor patient progress toward functional and medical goals.  Function:  Bathing Bathing position Bathing activity did not occur: Refused Position: Shower  Bathing parts Body parts bathed by patient: Right arm, Left arm, Chest, Abdomen, Front perineal area, Right upper leg, Left upper leg, Right lower leg, Left lower leg Body parts bathed by helper: Buttocks, Back  Bathing assist Assist Level: Touching or steadying assistance(Pt > 75%)      Upper Body Dressing/Undressing Upper  body dressing   What is the patient wearing?: Pull over shirt/dress     Pull over shirt/dress - Perfomed by patient: Thread/unthread right sleeve, Thread/unthread left sleeve, Put head through opening, Pull shirt over trunk Pull over shirt/dress - Perfomed by helper: Pull shirt over  trunk        Upper body assist Assist Level: Set up   Set up : To obtain clothing/put away  Lower Body Dressing/Undressing Lower body dressing   What is the patient wearing?: Pants, Underwear Underwear - Performed by patient: Thread/unthread right underwear leg, Thread/unthread left underwear leg, Pull underwear up/down Underwear - Performed by helper: Thread/unthread right underwear leg, Thread/unthread left underwear leg Pants- Performed by patient: Pull pants up/down, Thread/unthread right pants leg Pants- Performed by helper: Thread/unthread left pants leg Non-skid slipper socks- Performed by patient: Don/doff right sock, Don/doff left sock Non-skid slipper socks- Performed by helper: Don/doff right sock, Don/doff left sock               TED Hose - Performed by helper: Don/doff right TED hose, Don/doff left TED hose  Lower body assist Assist for lower body dressing: Touching or steadying assistance (Pt > 75%)      Toileting Toileting   Toileting steps completed by patient: Performs perineal hygiene, Adjust clothing after toileting Toileting steps completed by helper: Adjust clothing prior to toileting Toileting Assistive Devices: Prosthesis/orthosis  Toileting assist Assist level: Set up/obtain supplies   Transfers Chair/bed transfer   Chair/bed transfer method: Stand pivot Chair/bed transfer assist level: Touching or steadying assistance (Pt > 75%) Chair/bed transfer assistive device: Walker, Air cabin crew     Max distance: 50 Assist level: Touching or steadying assistance (Pt > 75%)   Wheelchair   Type: Manual Max wheelchair distance: 36ft Assist Level: Supervision or verbal cues  Cognition Comprehension Comprehension assist level: Follows basic conversation/direction with extra time/assistive device  Expression Expression assist level: Expresses basic needs/ideas: With no assist  Social Interaction Social Interaction assist level:  Interacts appropriately 90% of the time - Needs monitoring or encouragement for participation or interaction.  Problem Solving Problem solving assist level: Solves basic 90% of the time/requires cueing < 10% of the time  Memory Memory assist level: Recognizes or recalls 90% of the time/requires cueing < 10% of the time   Medical Problem List and Plan: 1. 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  -continue CIR therapies  -provided positive reinforcement about prognosis/pain 2. DVT Prophylaxis/Anticoagulation: Pharmaceutical: Lovenox 3. Pain Management: pain improved today  -oxycodone increased to 10mg . Scheduled robaxin to help with muscle spasms and low dose ultram 50mg  qid  -. K pad additionally for local measures.  4. Mood: Needs a lot of encouragement and ego support as is self limiting.   -anxiety a major issue. Added prn xanax.   -request neuropsych.  5. Neuropsych: This patient is capable of making decisions on her own behalf. 6. Skin/Wound Care: Monitor wound daily for healing 7. Fluids/Electrolytes/Nutrition: Monitor I/O. Intake 50-75% BMP stable on 5/26 8. T2DM: Will monitor BS ac/hs. Continue lantus at bedtime with trajenta in am. Continue SSI for elevated BS.  9. CAD: Continue lopressor and lipitor.  10. Wheezing: denies increase in chronic SOB.  CXR without acute disease. 12. CKD with persistent BLE edema: (baseline around 3?)  -Lasix being held per nephrology   -Appreciate nephrology consult and recs. Now BP appears to be elevated, will defer to nephro  for management  -Renal ultrasound reviewed on 5/25 suggesting medical renal disease 13. Abdominal pain: KUB negative. Continue to work on regular bowel schedule.  Good bowel movements on 5/25, incont bowel 5/26 14. Urinary retention:ua neg, cx negative Monitor PVRs and cath for volumes > 350cc  15. Acute on chronic anemia due to CKD: has had iron and  epo? injections in the past.  16. H/o gout: off colchicine, renal fxn limiting colchicine use, if pt has flare may need steroid  17. Thrombocytopenia: Cont to monitor, improving 18. Hyponatremia: Cont to monitor 19. AFib: Monitor HR with increased activity 20. HTN: Monitor with increased activity 21.  Right elbow pain, limited ROM on exam, no evidence of inflammation, no bony tenderness, maybe early gout?, check Xray LOS (Days) 5 A FACE TO FACE EVALUATION WAS PERFORMED  Charlett Blake 12/09/2015 7:48 AM

## 2015-12-10 ENCOUNTER — Inpatient Hospital Stay (HOSPITAL_COMMUNITY): Payer: Medicare Other | Admitting: Physical Therapy

## 2015-12-10 LAB — CBC
HEMATOCRIT: 26.1 % — AB (ref 36.0–46.0)
HEMOGLOBIN: 8.4 g/dL — AB (ref 12.0–15.0)
MCH: 30.7 pg (ref 26.0–34.0)
MCHC: 32.2 g/dL (ref 30.0–36.0)
MCV: 95.3 fL (ref 78.0–100.0)
Platelets: 233 10*3/uL (ref 150–400)
RBC: 2.74 MIL/uL — ABNORMAL LOW (ref 3.87–5.11)
RDW: 14.8 % (ref 11.5–15.5)
WBC: 9.4 10*3/uL (ref 4.0–10.5)

## 2015-12-10 LAB — GLUCOSE, CAPILLARY
GLUCOSE-CAPILLARY: 110 mg/dL — AB (ref 65–99)
GLUCOSE-CAPILLARY: 152 mg/dL — AB (ref 65–99)
Glucose-Capillary: 132 mg/dL — ABNORMAL HIGH (ref 65–99)
Glucose-Capillary: 159 mg/dL — ABNORMAL HIGH (ref 65–99)

## 2015-12-10 LAB — RENAL FUNCTION PANEL
ALBUMIN: 2.4 g/dL — AB (ref 3.5–5.0)
Anion gap: 10 (ref 5–15)
BUN: 83 mg/dL — AB (ref 6–20)
CHLORIDE: 100 mmol/L — AB (ref 101–111)
CO2: 22 mmol/L (ref 22–32)
CREATININE: 2.88 mg/dL — AB (ref 0.44–1.00)
Calcium: 9.1 mg/dL (ref 8.9–10.3)
GFR calc Af Amer: 17 mL/min — ABNORMAL LOW (ref >60)
GFR, EST NON AFRICAN AMERICAN: 15 mL/min — AB (ref >60)
Glucose, Bld: 103 mg/dL — ABNORMAL HIGH (ref 65–99)
POTASSIUM: 4.1 mmol/L (ref 3.5–5.1)
Phosphorus: 4.8 mg/dL — ABNORMAL HIGH (ref 2.5–4.6)
Sodium: 132 mmol/L — ABNORMAL LOW (ref 135–145)

## 2015-12-10 MED ORDER — DICLOFENAC SODIUM 1 % TD GEL
2.0000 g | Freq: Four times a day (QID) | TRANSDERMAL | Status: DC
  Administered 2015-12-10 – 2015-12-15 (×18): 2 g via TOPICAL
  Filled 2015-12-10: qty 100

## 2015-12-10 MED ORDER — TORSEMIDE 20 MG PO TABS
20.0000 mg | ORAL_TABLET | Freq: Every day | ORAL | Status: DC
  Administered 2015-12-10 – 2015-12-11 (×2): 20 mg via ORAL
  Filled 2015-12-10 (×2): qty 1

## 2015-12-10 MED ORDER — SODIUM CHLORIDE 0.9 % IV SOLN
510.0000 mg | Freq: Once | INTRAVENOUS | Status: AC
  Administered 2015-12-11: 510 mg via INTRAVENOUS
  Filled 2015-12-10 (×2): qty 17

## 2015-12-10 MED ORDER — DARBEPOETIN ALFA 100 MCG/0.5ML IJ SOSY
100.0000 ug | PREFILLED_SYRINGE | INTRAMUSCULAR | Status: DC
  Administered 2015-12-10: 100 ug via SUBCUTANEOUS
  Filled 2015-12-10 (×2): qty 0.5

## 2015-12-10 NOTE — Progress Notes (Signed)
Physical Therapy Session Note  Patient Details  Name: [name] MRN: [id number] Date of Birth: 09/28/1940  Today's Date: 12/10/2015 PT Individual Time: 0800-0930 PT Individual Time Calculation (min): 90 min   Short Term Goals: Week 1:  PT Short Term Goal 1 (Week 1): Pt will perform bed mobility on flat bed, no rails with mod A and recall back precautions with 50% cues PT Short Term Goal 2 (Week 1): Pt will perform sit<> stand and stand pivot transfers with RW and min A consistently with 50% cues for safety and sequencing PT Short Term Goal 3 (Week 1): Pt will perform w/c mobility x 50' in controlled environment with supervision PT Short Term Goal 4 (Week 1): Pt will perform 4 stair negotiation with bilat rails and mod A PT Short Term Goal 5 (Week 1): Pt will ambulate with RW x 50' with min A and reporting pain at <5/10.  Skilled Therapeutic Interventions/Progress Updates:  Pt was seen bedside in the am. Pt sitting on edge of bed. Pt able to recall 2/3 back precautions. Pt performed multiple sit to stand and stand pivot transfers with min A and verbal cues. Pt at times required more than one attempt to stand during transfer. Pt ambulated 64, 10, and 400 feet x 2 with rolling walker and min guard with verbal cues. Pt ambulates with decreased cadence and step length. Pt propelled w/c about 75 feet with B UEs and S. Pt preformed step taps 3 sets x 10 reps each for LE strengthening. Pt ascended/descended curb with rolling walker and min A with verbal cues x 2. Following treatment pt returned to room and left sitting up in w/cwith call bell within reach.   Therapy Documentation Precautions:  Precautions Precautions: Back, Fall Precaution Booklet Issued: No Precaution Comments: 2 of 3 BAT recalled during OT eval Restrictions Weight Bearing Restrictions: No General:   Pain: Pt c/o 4/10 back pain.   See Function Navigator for Current Functional Status.   Therapy/Group: Individual  Therapy  Dub Amis 12/10/2015, 12:06 PM

## 2015-12-10 NOTE — Progress Notes (Signed)
[place] PHYSICAL MEDICINE & REHABILITATION     PROGRESS NOTE    Subjective/Complaints: Slept well, post elbow pain when pushing with transfers  ROS: + Generalized pain. Denies CP, SOB, nausea, vomiting, diarrhea.  Objective: Vital Signs: Blood pressure 147/52, pulse 73, temperature 97.9 F (36.6 C), temperature source Oral, resp. rate 16, weight 93.396 kg (205 lb 14.4 oz), SpO2 97 %. Dg Elbow 2 Views Right  12/09/2015  CLINICAL DATA:  Posterior elbow pain and swelling. EXAM: RIGHT ELBOW - 2 VIEW COMPARISON:  None. FINDINGS: There is no evidence of fracture, dislocation, or joint effusion. There is no evidence of arthropathy or other focal bone abnormality. Dystrophic calcification at the origin of the right common extensor tendon origin. There is peripheral vascular atherosclerotic disease. IMPRESSION: No acute osseous injury of the right elbow. Electronically Signed   By: Kathreen Devoid   On: 12/09/2015 11:50    Recent Labs  12/08/15 0415  WBC 8.0  HGB 8.2*  HCT 25.0*  PLT 188    Recent Labs  12/09/15 1059 12/10/15 0450  NA 130* 132*  K 4.0 4.1  CL 95* 100*  GLUCOSE 144* 103*  BUN 83* 83*  CREATININE 3.06* 2.88*  CALCIUM 8.8* 9.1   CBG (last 3)   Recent Labs  12/09/15 1643 12/09/15 2130 12/10/15 0635  GLUCAP 129* 123* 110*    Wt Readings from Last 3 Encounters:  12/10/15 93.396 kg (205 lb 14.4 oz)  12/01/15 86.183 kg (190 lb)  11/23/15 86.274 kg (190 lb 3.2 oz)    Physical Exam:   Nursing note and vitals reviewed. Constitutional: She appears well-developed and well-nourished. NAD.  HENT: Normocephalic and atraumatic.  Mouth/Throat: Oropharynx is clear and moist.  Eyes: Conjunctivae and EOM are normal.  Cardiovascular: Normal rate and regular rhythm.  Respiratory: Effort normal. No stridor. She has wheezes (expiratory wheezes).  GI: Soft. Nontender. Bowel sounds normal.  Musculoskeletal: She exhibits edema (1+ pedal edema and B-tibially) and  tenderness. Pain with R elbow ROM, lacks end range flexion has full ext, triceps insertion tenderness Neurological: She is alert and oriented to person, place, and time.  Patient is very hard of hearing.  Motor: B/l UE 4+/5 proximal to distal B/l LE: Hip flexion, knee extension 4-/5, ankle dorsi/plantar flexion 4/5  Skin: Skin is warm and dry.  Skin: Skin is warm and dry. No rash noted.  Back incision clean and dry with steristrps and foam dressing Psychiatric: She has a normal mood and affect. Her behavior is anxious  .  Assessment/Plan: 1. 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 Physiatrist is providing close team supervision and 24 hour management of active medical problems listed below. Physiatrist and rehab team continue to assess barriers to discharge/monitor patient progress toward functional and medical goals.  Function:  Bathing Bathing position Bathing activity did not occur: Refused Position: Production manager parts bathed by patient: Right arm, Left arm, Chest, Abdomen, Front perineal area, Right upper leg, Left upper leg, Right lower leg, Left lower leg Body parts bathed by helper: Buttocks, Back  Bathing assist Assist Level: Touching or steadying assistance(Pt > 75%)      Upper Body Dressing/Undressing Upper body dressing   What is the patient wearing?: Pull over shirt/dress     Pull over shirt/dress - Perfomed by patient: Thread/unthread right sleeve, Thread/unthread left sleeve, Put head through  opening, Pull shirt over trunk Pull over shirt/dress - Perfomed by helper: Pull shirt over trunk        Upper body assist Assist Level: Set up   Set up : To obtain clothing/put away  Lower Body Dressing/Undressing Lower body dressing   What is the patient wearing?: Pants, Underwear Underwear -  Performed by patient: Thread/unthread right underwear leg, Thread/unthread left underwear leg, Pull underwear up/down Underwear - Performed by helper: Thread/unthread right underwear leg, Thread/unthread left underwear leg Pants- Performed by patient: Pull pants up/down, Thread/unthread right pants leg Pants- Performed by helper: Thread/unthread left pants leg Non-skid slipper socks- Performed by patient: Don/doff right sock, Don/doff left sock Non-skid slipper socks- Performed by helper: Don/doff right sock, Don/doff left sock               TED Hose - Performed by helper: Don/doff right TED hose, Don/doff left TED hose  Lower body assist Assist for lower body dressing: Touching or steadying assistance (Pt > 75%)      Toileting Toileting   Toileting steps completed by patient: Performs perineal hygiene, Adjust clothing after toileting Toileting steps completed by helper: Adjust clothing prior to toileting Toileting Assistive Devices: Prosthesis/orthosis  Toileting assist Assist level: Set up/obtain supplies   Transfers Chair/bed transfer   Chair/bed transfer method: Stand pivot Chair/bed transfer assist level: Touching or steadying assistance (Pt > 75%) Chair/bed transfer assistive device: Walker, Air cabin crew     Max distance: 50 Assist level: Touching or steadying assistance (Pt > 75%)   Wheelchair   Type: Manual Max wheelchair distance: 45ft Assist Level: Supervision or verbal cues  Cognition Comprehension Comprehension assist level: Follows basic conversation/direction with extra time/assistive device  Expression Expression assist level: Expresses basic needs/ideas: With no assist  Social Interaction Social Interaction assist level: Interacts appropriately 90% of the time - Needs monitoring or encouragement for participation or interaction.  Problem Solving Problem solving assist level: Solves basic 90% of the time/requires cueing < 10% of the  time  Memory Memory assist level: Recognizes or recalls 90% of the time/requires cueing < 10% of the time   Medical Problem List and Plan: 1. 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  -continue CIR therapies  -discussed elbow xray pt pleased to hear no acute abnormality 2. DVT Prophylaxis/Anticoagulation: Pharmaceutical: Lovenox 3. Pain Management: pain improved today  -oxycodone increased to 10mg . Scheduled robaxin to help with muscle spasms and low dose ultram 50mg  qid  -. K pad additionally for local measures.  4. Mood: Needs a lot of encouragement and ego support as is self limiting.   -anxiety a major issue. Added prn xanax.   -request neuropsych.  5. Neuropsych: This patient is capable of making decisions on her own behalf. 6. Skin/Wound Care: Monitor wound daily for healing 7. Fluids/Electrolytes/Nutrition: Monitor I/O. Intake 50-75% BMP stable on 5/26 8. T2DM: Will monitor BS ac/hs. Continue lantus at bedtime with trajenta in am. Continue SSI for elevated BS.  9. CAD: Continue lopressor and lipitor.  10. Wheezing: denies increase in chronic SOB.  CXR without acute disease. 12. CKD with persistent BLE edema: (baseline around 3?)  -Lasix being held per nephrology   -Appreciate nephrology consult and recs. Now BP appears to be elevated, will defer to nephro for management  -Renal ultrasound reviewed on 5/25 suggesting medical renal disease 13. Abdominal pain: KUB negative. Continue to work on regular bowel schedule.  Good bowel  movements on 5/25, loose BM 5/26 14. Urinary retention:ua neg, cx negative Monitor PVRs and cath for volumes > 350cc  15. Acute on chronic anemia due to CKD: has had iron and epo? injections in the past.  16. H/o gout: off colchicine, due to renal fxn 17. Thrombocytopenia: Cont to monitor, improving 18. Hyponatremia: Cont to monitor 19. AFib: Monitor HR with increased  activity 20. HTN: Monitor with increased activity 21.  Right elbow pain, limited ROM on exam, no evidence of inflammation, elbow Xray showed lateral epicondyle calcifications, had many tennis elbow injections in past , triceps insertion tenderness on exam , will trial voltaren LOS (Days) 6 A FACE TO FACE EVALUATION WAS PERFORMED  KIRSTEINS,ANDREW E 12/10/2015 7:29 AM

## 2015-12-10 NOTE — Progress Notes (Signed)
Physical Therapy Session Note  Patient Details  Name: [name] MRN: [id number] Date of Birth: [birth year]  Today's Date: 12/10/2015 PT Individual Time: [id number] PT Individual Time Calculation (min): 30 min   Short Term Goals: Week 1:  PT Short Term Goal 1 (Week 1): Pt will perform bed mobility on flat bed, no rails with mod A and recall back precautions with 50% cues PT Short Term Goal 2 (Week 1): Pt will perform sit<> stand and stand pivot transfers with RW and min A consistently with 50% cues for safety and sequencing PT Short Term Goal 3 (Week 1): Pt will perform w/c mobility x 50' in controlled environment with supervision PT Short Term Goal 4 (Week 1): Pt will perform 4 stair negotiation with bilat rails and mod A PT Short Term Goal 5 (Week 1): Pt will ambulate with RW x 50' with min A and reporting pain at <5/10.  Skilled Therapeutic Interventions/Progress Updates:  Pt was seen bedside in the pm. Pt preformed multiple sit to stand transfers with rolling walker and min guard to min A with verbal cues. Pt ambulated distances of 15, 50, 45, and 20 feet with rolling walker and S to min guard. Pt left sitting up in recliner with call bell within reach.   Therapy Documentation Precautions:  Precautions Precautions: Back, Fall Precaution Booklet Issued: No Precaution Comments: 2 of 3 BAT recalled during OT eval Restrictions Weight Bearing Restrictions: No General:   Pain: Pt c/o R knee pain.   See Function Navigator for Current Functional Status.   Therapy/Group: Individual Therapy  Dub Amis 12/10/2015, 3:50 PM

## 2015-12-10 NOTE — Progress Notes (Signed)
Patient ID: BASYA GRUENBERG, female   DOB: Feb 07, 1941, 75 y.o.   MRN: [id number]  [facility] Progress Note   Assessment/ Plan:   1. AKI on CKD IV (baseline Cr 2.4-2.6): multifactorial with some retention and intermittent relative hypotension-- with fair UOP and slow improvement in renal function. Will restart diuretic today--torsemide 20mg  daily (instead of furosemide because of low albumin). Continue daily labs.  2. Hypertension: some elevation of blood pressure noted this morning after stopping diuretic and amlodipine to improve renal perfusion. Restart diuretic today-- may need to avoid amlodipine with her edema. 3. S/p Microdiskectomy for L5/S1 radiculopathy: ongoing effort at in-patient rehabilitation that appear to be affected by her pain and narcotic associated somnolence/constipation 4. Anemia: Low iron stores--replacing with IV Fe and will start ESA  Subjective:   Reports to be feeling fair--complains of leg swelling and irritated by IV/foley   Objective:   BP 147/52 mmHg  Pulse 73  Temp(Src) 97.9 F (36.6 C) (Oral)  Resp 16  Wt 93.396 kg (205 lb 14.4 oz)  SpO2 97%  Intake/Output Summary (Last 24 hours) at 12/10/15 1056 Last data filed at 12/10/15 0826  Gross per 24 hour  Intake    360 ml  Output   1275 ml  Net   -915 ml   Weight change: 0.454 kg (1 lb)  Physical Exam: [id number] sitting in wheelchair [id number] RRR, S1 and S2 with 3/6 HSM Resp:Decreased breath sounds over bases bilaterally, no rales/rhonchi [id number], obese, NT, BS normal Ext:2-3+ LE edema  Imaging: Dg Elbow 2 Views Right  12/09/2015  CLINICAL DATA:  Posterior elbow pain and swelling. EXAM: RIGHT ELBOW - 2 VIEW COMPARISON:  None. FINDINGS: There is no evidence of fracture, dislocation, or joint effusion. There is no evidence of arthropathy or other focal bone abnormality. Dystrophic calcification at the origin of the right common extensor tendon origin. There is peripheral vascular  atherosclerotic disease. IMPRESSION: No acute osseous injury of the right elbow. Electronically Signed   By: Kathreen Devoid   On: 12/09/2015 11:50    Labs: BMET  Recent Labs Lab 12/04/15 [id number] 12/05/15 0450 12/07/15 0521 12/08/15 0415 12/09/15 1059 12/10/15 0450  NA 134* 133* 132* 135 130* 132*  K 3.6 3.8 4.2 3.7 4.0 4.1  CL 102 100* 98* 102 95* 100*  CO2 21* 20* 21* 23 19* 22  GLUCOSE 167* 149* 132* 104* 144* 103*  BUN 66* 69* 79* 82* 83* 83*  CREATININE 3.00* 3.04* 3.25* 3.08* 3.06* 2.88*  CALCIUM 8.5* 8.7* 9.3 8.8* 8.8* 9.1  PHOS  --   --   --  5.5*  --  4.8*   CBC  Recent Labs Lab 12/04/15 0316 12/05/15 0450 12/06/15 0651 12/08/15 0415  WBC 9.1 8.9 7.9 8.0  NEUTROABS  --  6.1  --   --   HGB 8.4* 8.2* 8.0* 8.2*  HCT 25.9* 25.2* 24.5* 25.0*  MCV 94.9 94.4 95.7 94.0  PLT 133* 145* 154 188    Medications:    . allopurinol  100 mg Oral BID  . atorvastatin  10 mg Oral Daily  . brimonidine  1 drop Both Eyes BID  . calcitRIOL  0.25 mcg Oral QODAY  . citalopram  20 mg Oral Daily  . diclofenac sodium  2 g Topical QID  . docusate sodium  100 mg Oral BID  . enoxaparin (LOVENOX) injection  30 mg Subcutaneous Q24H  . [START ON 12/11/2015] ferumoxytol  510 mg Intravenous Once  . insulin aspart  0-15 Units Subcutaneous TID WC  . insulin glargine  20 Units Subcutaneous QHS  . latanoprost  1 drop Left Eye QHS  . lidocaine  1 patch Transdermal Q24H  . linagliptin  5 mg Oral Q breakfast  . methocarbamol  500 mg Oral QID  . metoprolol  50 mg Oral BID  . MUSCLE RUB   Topical BID  . polyethylene glycol  17 g Oral BID  . timolol  1 drop Left Eye Daily  . traMADol  50 mg Oral TID PC & HS   Elmarie Shiley, MD 12/10/2015, 10:56 AM

## 2015-12-10 NOTE — Progress Notes (Signed)
Physical Therapy Session Note  Patient Details  Name: [name] MRN: [id number] Date of Birth: February 04, 1941  Today's Date: 12/10/2015 PT Individual Time: [id number]  PT Individual Time Calculation (min): 30 min and 41 min  Short Term Goals: Week 1:  PT Short Term Goal 1 (Week 1): Pt will perform bed mobility on flat bed, no rails with mod A and recall back precautions with 50% cues PT Short Term Goal 2 (Week 1): Pt will perform sit<> stand and stand pivot transfers with RW and min A consistently with 50% cues for safety and sequencing PT Short Term Goal 3 (Week 1): Pt will perform w/c mobility x 50' in controlled environment with supervision PT Short Term Goal 4 (Week 1): Pt will perform 4 stair negotiation with bilat rails and mod A PT Short Term Goal 5 (Week 1): Pt will ambulate with RW x 50' with min A and reporting pain at <5/10.  Skilled Therapeutic Interventions/Progress Updates:    Treatment 1: Pt received in w/c & agreeable to PT, noting 6/10 pain & PT notified RN. Pt reported "I'm wore out" from previous PT session.  Pt completed 5x sit-to-stand with BUE support from w/c for strength & endurance training, requiring significantly extra time to complete task. Gait training 35 ft + 65 ft with RW & steady A. Pt with decreased gait speed & decreased step length BLE. Educated pt on pursed lip breathing throughout session while at rest & during ambulation. Educated pt on need to increase ambulation distances to increase endurance & pt agreeable. At end of session pt left in w/c with all needs within reach & daughter present to supervise.   Treatment 2: Pt received in recliner, noting 5/10 HA & 6-7/10 back pain but RN already aware. Pt agreeable to treatment & completed all sit<>stand transfers with supervision & cuing for proper hand placement on w/c armrests. Gait training x 20 ft + 35 ft +30 ft + 20 ft throughout session with RW & min guard assistance. Pt has anxiety & fear of  pain which causes pt to request rest breaks frequently. PT provided encouragement to increase ambulation distances & to maintain pursed lip breathing throughout gait training to help calm pt but pt with poor demonstration during activity. Pt also reports that her right knee "is about to give out" when walking, which causes her to request rest breaks.In gym, utilized nu-step level 1 x 11 minutes, with pt reporting she was "about give out" after 4 minutes but independently continued to participate in activity when engaged in conversation about family, cooking, etc. At end of session pt returned to room & left in recliner with all needs within reach & family present to supervise.   Therapy Documentation Precautions:  Precautions Precautions: Back, Fall Precaution Booklet Issued: No Precaution Comments: 2 of 3 BAT recalled during OT eval Restrictions Weight Bearing Restrictions: No   See Function Navigator for Current Functional Status.   Therapy/Group: Individual Therapy  Waunita Schooner 12/10/2015, 8:00 AM

## 2015-12-11 ENCOUNTER — Inpatient Hospital Stay (HOSPITAL_COMMUNITY): Payer: Medicare Other | Admitting: Occupational Therapy

## 2015-12-11 ENCOUNTER — Inpatient Hospital Stay (HOSPITAL_COMMUNITY): Payer: Medicare Other | Admitting: Physical Therapy

## 2015-12-11 LAB — GLUCOSE, CAPILLARY
Glucose-Capillary: 91 mg/dL (ref 65–99)
Glucose-Capillary: 122 mg/dL — ABNORMAL HIGH (ref 65–99)
Glucose-Capillary: 139 mg/dL — ABNORMAL HIGH (ref 65–99)
Glucose-Capillary: 131 mg/dL — ABNORMAL HIGH (ref 65–99)

## 2015-12-11 LAB — RENAL FUNCTION PANEL
ANION GAP: 10 (ref 5–15)
Albumin: 2.4 g/dL — ABNORMAL LOW (ref 3.5–5.0)
BUN: 81 mg/dL — ABNORMAL HIGH (ref 6–20)
CO2: 23 mmol/L (ref 22–32)
Calcium: 9 mg/dL (ref 8.9–10.3)
Chloride: 102 mmol/L (ref 101–111)
Creatinine, Ser: 2.69 mg/dL — ABNORMAL HIGH (ref 0.44–1.00)
GFR calc Af Amer: 19 mL/min — ABNORMAL LOW (ref >60)
GFR calc non Af Amer: 16 mL/min — ABNORMAL LOW (ref >60)
GLUCOSE: 91 mg/dL (ref 65–99)
POTASSIUM: 4 mmol/L (ref 3.5–5.1)
Phosphorus: 4.6 mg/dL (ref 2.5–4.6)
Sodium: 135 mmol/L (ref 135–145)

## 2015-12-11 MED ORDER — METOPROLOL TARTRATE 50 MG PO TABS
100.0000 mg | ORAL_TABLET | Freq: Two times a day (BID) | ORAL | Status: DC
  Administered 2015-12-11 – 2015-12-15 (×8): 100 mg via ORAL
  Filled 2015-12-11 (×8): qty 2

## 2015-12-11 MED ORDER — FUROSEMIDE 80 MG PO TABS
80.0000 mg | ORAL_TABLET | Freq: Every day | ORAL | Status: DC
  Administered 2015-12-11 – 2015-12-15 (×5): 80 mg via ORAL
  Filled 2015-12-11 (×5): qty 1

## 2015-12-11 NOTE — Progress Notes (Signed)
Physical Therapy Session Note  Patient Details  Name: [name] MRN: [id number] Date of Birth: Oct 30, 1940  Today's Date: 12/11/2015 PT Individual Time: 0800-0900 PT Individual Time Calculation (min): 60 min   Short Term Goals: Week 1:  PT Short Term Goal 1 (Week 1): Pt will perform bed mobility on flat bed, no rails with mod A and recall back precautions with 50% cues PT Short Term Goal 2 (Week 1): Pt will perform sit<> stand and stand pivot transfers with RW and min A consistently with 50% cues for safety and sequencing PT Short Term Goal 3 (Week 1): Pt will perform w/c mobility x 50' in controlled environment with supervision PT Short Term Goal 4 (Week 1): Pt will perform 4 stair negotiation with bilat rails and mod A PT Short Term Goal 5 (Week 1): Pt will ambulate with RW x 50' with min A and reporting pain at <5/10.  Skilled Therapeutic Interventions/Progress Updates:   Patient received sitting EOB eating breakfast. Patient stood from EOB with cues for hand placement and ambulated to sink using RW to brush teeth in standing with supervision. Throughout session, performed gait training using RW x 15 ft + 10 ft + 60 ft + 115 ft with supervision in controlled environment. Performed simulated car transfer using RW with min A and cues for hand placement to push up from stable surface instead of pulling up with BUE on RW. Gait training using RW up/down ramp and across uneven wood chip surface with supervision and up/down curb step using RW with min A. In room, patient returned to recliner using RW with supervision and left seated with BLE elevated and all needs in reach. Patient tolerated session well although she required prolonged seated rest breaks between all mobility tasks due to SOB but did not require any encouragement to participate or cues for pursed lip breathing.   Therapy Documentation Precautions:  Precautions Precautions: Back, Fall Precaution Booklet Issued: No Precaution  Comments: 2 of 3 BAT recalled during OT eval Restrictions Weight Bearing Restrictions: No Pain: Pain Assessment Pain Assessment: No/denies pain  See Function Navigator for Current Functional Status.   Therapy/Group: Individual Therapy  Laretta Alstrom 12/11/2015, 8:59 AM

## 2015-12-11 NOTE — Progress Notes (Signed)
Subjective: Interval History: has complaints legs swollen and Tender Foley out.  Objective: Vital signs in last 24 hours: Temp:  [98.3 F (36.8 C)-98.5 F (36.9 C)] 98.3 F (36.8 C) (05/29 0512) Pulse Rate:  [63-99] 99 (05/29 0512) Resp:  [19-20] 19 (05/29 0512) BP: (142-163)/(48-69) 161/57 mmHg (05/29 0512) SpO2:  [96 %-99 %] 96 % (05/29 0512) Weight:  [93.033 kg (205 lb 1.6 oz)] 93.033 kg (205 lb 1.6 oz) (05/29 0512) Weight change: -0.363 kg (-12.8 oz)  Intake/Output from previous day: 05/28 0701 - 05/29 0700 In: 360 [P.O.:360] Out: 1350 [Urine:1350] Intake/Output this shift: Total I/O In: 240 [P.O.:240] Out: -   General appearance: alert, cooperative, moderately obese and pale Resp: diminished breath sounds bilaterally Cardio: S1, S2 normal and systolic murmur: systolic ejection 2/6, decrescendo at 2nd left intercostal space GI: obese, pos bs, liver down 5 cm Extremities: Homans sign is negative, no sign of DVT  3+ edema Lab Results:  Recent Labs  12/10/15 1141  WBC 9.4  HGB 8.4*  HCT 26.1*  PLT 233   BMET:  Recent Labs  12/10/15 0450 12/11/15 0501  NA 132* 135  K 4.1 4.0  CL 100* 102  CO2 22 23  GLUCOSE 103* 91  BUN 83* 81*  CREATININE 2.88* 2.69*  CALCIUM 9.1 9.0   No results for input(s): PTH in the last 72 hours. Iron Studies: No results for input(s): IRON, TIBC, TRANSFERRIN, FERRITIN in the last 72 hours.  Studies/Results: No results found.  I have reviewed the patient's current medications.  Assessment/Plan: 1 CKD/AKI at baseline  . Vol xs use bid Lasix. Needs ongoing bladder scans with foley out. 2 Anemia add esa 3 HPTH vit D 4 Obesity 5DM controlled 6 S/P back surgery  Per rehab P follow bladder scans, po lasix, esa, vit D  Will s/o and see again at your request.  F/u with Dr. Mercy Moore after d/c    LOS: 7 days   Corliss Coggeshall L 12/11/2015,12:04 PM

## 2015-12-11 NOTE — Progress Notes (Signed)
[place] PHYSICAL MEDICINE & REHABILITATION     PROGRESS NOTE    Subjective/Complaints: Patient sitting up at the edge of her bed eating breakfast this morning. She states that her pain is improved since yesterday, but still present all over.  ROS: + Generalized pain. Denies CP, SOB, nausea, vomiting, diarrhea.  Objective: Vital Signs: Blood pressure 161/57, pulse 99, temperature 98.3 F (36.8 C), temperature source Oral, resp. rate 19, weight 93.033 kg (205 lb 1.6 oz), SpO2 96 %. Dg Elbow 2 Views Right  12/09/2015  CLINICAL DATA:  Posterior elbow pain and swelling. EXAM: RIGHT ELBOW - 2 VIEW COMPARISON:  None. FINDINGS: There is no evidence of fracture, dislocation, or joint effusion. There is no evidence of arthropathy or other focal bone abnormality. Dystrophic calcification at the origin of the right common extensor tendon origin. There is peripheral vascular atherosclerotic disease. IMPRESSION: No acute osseous injury of the right elbow. Electronically Signed   By: Kathreen Devoid   On: 12/09/2015 11:50    Recent Labs  12/10/15 1141  WBC 9.4  HGB 8.4*  HCT 26.1*  PLT 233    Recent Labs  12/10/15 0450 12/11/15 0501  NA 132* 135  K 4.1 4.0  CL 100* 102  GLUCOSE 103* 91  BUN 83* 81*  CREATININE 2.88* 2.69*  CALCIUM 9.1 9.0   CBG (last 3)   Recent Labs  12/10/15 1608 12/10/15 2145 12/11/15 0707  GLUCAP 152* 159* 91    Wt Readings from Last 3 Encounters:  12/11/15 93.033 kg (205 lb 1.6 oz)  12/01/15 86.183 kg (190 lb)  11/23/15 86.274 kg (190 lb 3.2 oz)    Physical Exam:   Nursing note and vitals reviewed. Constitutional: She appears well-developed and well-nourished. NAD.  HENT: Normocephalic and atraumatic.  Mouth/Throat: Oropharynx is clear and moist.  Eyes: Conjunctivae and EOM are normal.  Cardiovascular: Normal rate and regular rhythm.  Respiratory: Effort normal. No stridor.   GI: Soft. Nontender. Bowel sounds normal.  Musculoskeletal: She  exhibits edema (1+ pedal edema and B-tibially) and tenderness.  Neurological: She is alert and oriented to person, place, and time.  Patient is very hard of hearing.  Motor: B/l UE 4+/5 proximal to distal B/l LE: Hip flexion, knee extension 3+/5, ankle dorsi/plantar flexion 4+/5  Skin: Skin is warm and dry.  Skin: Skin is warm and dry. No rash noted.  Back incision clean and dry with steristrps and foam dressing Psychiatric: She has a normal mood and affect. Her behavior is anxious  .  Assessment/Plan: 1. 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 Physiatrist is providing close team supervision and 24 hour management of active medical problems listed below. Physiatrist and rehab team continue to assess barriers to discharge/monitor patient progress toward functional and medical goals.  Function:  Bathing Bathing position Bathing activity did not occur: Refused Position: Production manager parts bathed by patient: Right arm, Left arm, Chest, Abdomen, Front perineal area, Right upper leg, Left upper leg, Right lower leg, Left lower leg Body parts bathed by helper: Buttocks, Back  Bathing assist Assist Level: Touching or steadying assistance(Pt > 75%)      Upper Body Dressing/Undressing Upper body dressing   What is the patient wearing?: Pull over shirt/dress     Pull over shirt/dress - Perfomed by patient: Thread/unthread right sleeve, Thread/unthread left sleeve, Put head through  opening, Pull shirt over trunk Pull over shirt/dress - Perfomed by helper: Pull shirt over trunk        Upper body assist Assist Level: Set up   Set up : To obtain clothing/put away  Lower Body Dressing/Undressing Lower body dressing   What is the patient wearing?: Pants, Underwear Underwear - Performed by patient: Thread/unthread right  underwear leg, Thread/unthread left underwear leg, Pull underwear up/down Underwear - Performed by helper: Thread/unthread right underwear leg, Thread/unthread left underwear leg Pants- Performed by patient: Pull pants up/down, Thread/unthread right pants leg Pants- Performed by helper: Thread/unthread left pants leg Non-skid slipper socks- Performed by patient: Don/doff right sock, Don/doff left sock Non-skid slipper socks- Performed by helper: Don/doff right sock, Don/doff left sock               TED Hose - Performed by helper: Don/doff right TED hose, Don/doff left TED hose  Lower body assist Assist for lower body dressing: Touching or steadying assistance (Pt > 75%)      Toileting Toileting   Toileting steps completed by patient: Performs perineal hygiene, Adjust clothing after toileting Toileting steps completed by helper: Adjust clothing prior to toileting Toileting Assistive Devices: Prosthesis/orthosis  Toileting assist Assist level: Set up/obtain supplies   Transfers Chair/bed transfer   Chair/bed transfer method: Ambulatory Chair/bed transfer assist level: Supervision or verbal cues Chair/bed transfer assistive device: Armrests, Medical sales representative     Max distance: 115 Assist level: Supervision or verbal cues   Wheelchair   Type: Manual Max wheelchair distance: 75 Assist Level: Supervision or verbal cues  Cognition Comprehension Comprehension assist level: Follows complex conversation/direction with extra time/assistive device  Expression Expression assist level: Expresses complex ideas: With extra time/assistive device  Social Interaction Social Interaction assist level: Interacts appropriately with others with medication or extra time (anti-anxiety, antidepressant).  Problem Solving Problem solving assist level: Solves basic problems with no assist  Memory Memory assist level: Recognizes or recalls 90% of the time/requires cueing < 10% of the  time   Medical Problem List and Plan: 1. 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  -continue CIR therapies  -discussed elbow xray pt pleased to hear no acute abnormality 2. DVT Prophylaxis/Anticoagulation: Pharmaceutical: Lovenox 3. Pain Management: pain improved today  -oxycodone increased to 10mg . Scheduled robaxin to help with muscle spasms and low dose ultram 50mg  qid  -. K pad additionally for local measures.  4. Mood: Needs a lot of encouragement and ego support as is self limiting.   -anxiety a major issue. Added prn xanax.   -request neuropsych.  5. Neuropsych: This patient is capable of making decisions on her own behalf. 6. Skin/Wound Care: Monitor wound daily for healing 7. Fluids/Electrolytes/Nutrition: Monitor I/O. 8. T2DM: Will monitor BS ac/hs. Continue lantus at bedtime with trajenta in am. Continue SSI for elevated BS.  9. CAD: Continue lopressor and lipitor.  10. Wheezing: denies increase in chronic SOB.  CXR without acute disease. 12. CKD with persistent BLE edema: (baseline around 3?)  -Lasix being held per nephrology   -Appreciate nephrology consult and recs. Now BP appears to be elevated, will defer to nephro for management  -Renal ultrasound reviewed on 5/25 suggesting medical renal disease 13. Abdominal pain: KUB negative. Continue to work on regular bowel schedule.  Good bowel movements  14. Urinary retention:ua neg, cx negative Monitor PVRs and cath for volumes > 350cc after cath removal  15. Acute  on chronic anemia due to CKD: has had iron and epo? injections in the past.  16. H/o gout: off colchicine, due to renal fxn 17. Thrombocytopenia: Cont to monitor, improving 18. Hyponatremia: Cont to monitor 19. AFib: Monitor HR with increased activity 20. HTN: Monitor with increased activity  Metoprolol increased to 100 on 5/29 21.  Right elbow pain: Significantly improved  Limited ROM  on exam, no evidence of inflammation, elbow Xray showed lateral epicondyle calcifications, had many tennis elbow injections in past  Voltaren gel effective  LOS (Days) 7 A FACE TO FACE EVALUATION WAS PERFORMED  Latravious Levitt Lorie Phenix 12/11/2015 9:25 AM

## 2015-12-11 NOTE — Progress Notes (Signed)
Occupational Therapy Session Note  Patient Details  Name: [name] MRN: [id number] Date of Birth: 07-Jan-1941  Today's Date: 12/11/2015 OT Individual Time: [id number] OT Individual Time Calculation (min): 60 min    Short Term Goals: Week 1:  OT Short Term Goal 1 (Week 1): Pt will complete lower body dressing using AE to maintain back precatuions 100% of the time OT Short Term Goal 2 (Week 1): Pt will complete toilet hygiene independently OT Short Term Goal 3 (Week 1): Pt will bathe, sitting and standing with steadying assist OT Short Term Goal 4 (Week 1): Pt will demo ability to perform light home making task using RW with min assist  Skilled Therapeutic Interventions/Progress Updates:    Pt seen OT ADL bathing and dressing session. Pt sitting up in recliner upon arrival, agreeable to tx session. She ambulated throughout session with close supervision using RW. She bathed seated on tub bench, completing sit <> stands with use of grab to complete pericare/ buttock hygiene. She completed all sit <> stands with supervision and increased time, an improvement from previous sessions.  She dressed seated EOB following extended rest break. She was able to recall technique for use of reacher for LB dressing to maintain spinal precautions.  Pt returned to w/c at end of session, left set-up to complete grooming tasks with daughter present. Rest breaks required throughout session due to decreased activity tolerance, pain not a limiting factor this session as it has been in the past.  Pt and daughter educated throughout session regarding DME, incorporating activity into daily tasks, benefits of mobility, and d/c planning.   Therapy Documentation Precautions:  Precautions Precautions: Back, Fall Precaution Booklet Issued: No Precaution Comments: 2 of 3 BAT recalled during OT eval Restrictions Weight Bearing Restrictions: No Pain: Pain Assessment Pain Assessment: No/denies pain Pain Score: 5   Pain Location: Back Pain Orientation: Left;Lower Pain Intervention(s): Medication (See eMAR), Repositioned, shower, RN aware ADL: ADL ADL Comments: see Functional Assessment Tool   See Function Navigator for Current Functional Status.   Therapy/Group: Individual Therapy  Lewis, Analee Montee C 12/11/2015, 6:48 AM

## 2015-12-11 NOTE — Progress Notes (Signed)
Physical Therapy Session Note  Patient Details  Name: [name] MRN: [id number] Date of Birth: 06-23-1941  Today's Date: 12/11/2015 PT Individual Time: 1300-1415 PT Individual Time Calculation (min): 75 min   Short Term Goals: Week 1:  PT Short Term Goal 1 (Week 1): Pt will perform bed mobility on flat bed, no rails with mod A and recall back precautions with 50% cues PT Short Term Goal 2 (Week 1): Pt will perform sit<> stand and stand pivot transfers with RW and min A consistently with 50% cues for safety and sequencing PT Short Term Goal 3 (Week 1): Pt will perform w/c mobility x 50' in controlled environment with supervision PT Short Term Goal 4 (Week 1): Pt will perform 4 stair negotiation with bilat rails and mod A PT Short Term Goal 5 (Week 1): Pt will ambulate with RW x 50' with min A and reporting pain at <5/10.  Skilled Therapeutic Interventions/Progress Updates:    Pt received seated in w/c, c/o pain as described below and agreeable. Anxious about having to get another IV placed. Gait to/from bathroom with RW and S. Performs all clothing management and peri hygiene with S and grab bars. Gait x75', 135' with RW and S. Seated rest break between trials due to fatigue. Standing tolerance and dynamic standing balance while performing pipe tree on level surface and on balance foam. Fatigues more quickly on balance foam d/t increased energy expenditure, however able to tolerate approximately 2 min standing before requiring rest. Standing alternating toe taps to 2" and 3" steps with BLE; initial trial with RW to increase confidence, all remaining trials performed with R HHA and min/modA for balance. Performed 2x20 taps on each height step. Gait 3x20' with no AD and L HHA min/modA for balance. Initially with strong L limp and poor weight shift to L, improved with repetition and observable reduction in limping, pain avoidance behaviors. Sit >supine in bed with modA for LE management. Min cues  for repositioning in bed. Remained supine in bed at completion of session, all needs within reach.   Therapy Documentation Precautions:  Precautions Precautions: Back, Fall Precaution Booklet Issued: No Precaution Comments: 2 of 3 BAT recalled during OT eval Restrictions Weight Bearing Restrictions: No Pain: Pain Assessment Pain Score: 5    See Function Navigator for Current Functional Status.   Therapy/Group: Individual Therapy  Luberta Mutter 12/11/2015, 2:54 PM

## 2015-12-11 NOTE — Progress Notes (Addendum)
Recreational Therapy Session Note  Patient Details  Name: [name] MRN: [id number] Date of Birth: [birth year] Today's Date: 12/11/2015  Pain: no c/o Skilled Therapeutic Interventions/Progress Updates: Met with pt today to discuss TR services and the importance of staying active.  Pt with limited involvement in leisure tasks PTA stating that pain and low activity tolerance limited her activities.  Although they were sedentary, pt did state modifications she had made to allow her to continue to participate in previously enjoyed activities.  Nor further TR at this time.  Will continue to monitor through team. Therapy/Group: Individual Therapy  Cuong Moorman 12/11/2015, 9:29 AM

## 2015-12-12 ENCOUNTER — Inpatient Hospital Stay (HOSPITAL_COMMUNITY): Payer: Medicare Other | Admitting: Occupational Therapy

## 2015-12-12 ENCOUNTER — Inpatient Hospital Stay (HOSPITAL_COMMUNITY): Payer: Medicare Other | Admitting: Physical Therapy

## 2015-12-12 ENCOUNTER — Inpatient Hospital Stay (HOSPITAL_COMMUNITY): Payer: Medicare Other

## 2015-12-12 LAB — GLUCOSE, CAPILLARY
GLUCOSE-CAPILLARY: 96 mg/dL (ref 65–99)
Glucose-Capillary: 117 mg/dL — ABNORMAL HIGH (ref 65–99)
Glucose-Capillary: 169 mg/dL — ABNORMAL HIGH (ref 65–99)
Glucose-Capillary: 144 mg/dL — ABNORMAL HIGH (ref 65–99)

## 2015-12-12 LAB — OCCULT BLOOD X 1 CARD TO LAB, STOOL: FECAL OCCULT BLD: NEGATIVE

## 2015-12-12 MED ORDER — OXYCODONE HCL 5 MG PO TABS
5.0000 mg | ORAL_TABLET | ORAL | Status: DC | PRN
  Filled 2015-12-12: qty 1

## 2015-12-12 NOTE — Progress Notes (Signed)
Occupational Therapy Note  Patient Details  Name: [name] MRN: [id number] Date of Birth: 07-Oct-1940  Today's Date: 12/12/2015 OT Individual Time: 1330-1400 OT Individual Time Calculation (min): 30 min   Pt c/o low back pain (unrated); emotional support provided, exercise Individual therapy  Pt resting in recliner upon arrival with family present.  Pt amb with RW to door and back before engaging in LUE therex with theraband and 1kg weighted ball.  Pt attempted to engage RUE in therex but limited by decrease AROM and strength.  Pt remained in recliner with family present at end of session.   Leotis Shames [facility] 12/12/2015, 2:16 PM

## 2015-12-12 NOTE — Progress Notes (Signed)
Physical Therapy Session Note  Patient Details  Name: [name] MRN: [id number] Date of Birth: 1940-08-17  Today's Date: 12/12/2015 PT Individual Time: 1601-1630 PT Individual Time Calculation (min): 29 min   Short Term Goals: Week 2:  PT Short Term Goal 1 (Week 2): =LTG due to estimated LOS  Skilled Therapeutic Interventions/Progress Updates:    Pt received in recliner & agreeable to PT, noting 5/10 back pain but reports receiving medication prior to PT arrival. Assisted pt with donning ted hose total A & educated pt on need to wear hose to reduce BLE edema. Pt completed sit<>stand with supervision/steady A, and gait training x 100 ft + 170 ft with RW & seated rest breaks in between. Pt reported "I'm going to get the shakes" & PT observed LLE tremors during ambulation. Gait training x 15 ft w/c>recliner at end of session. Educated pt on "BAT" acronym to help remember back precautions & pt able to verbalize them at end of session. Upon PT exit pt left in recliner with BLE elevated, all needs within reach & husband present to supervise.   Therapy Documentation Precautions:  Precautions Precautions: Back, Fall Precaution Booklet Issued: No Precaution Comments: 2 of 3 BAT recalled during OT eval Restrictions Weight Bearing Restrictions: No  Pain: Pain Assessment Pain Assessment: 0-10 Pain Score: 5  Pain Intervention(s): Repositioned;Ambulation/increased activity   See Function Navigator for Current Functional Status.   Therapy/Group: Individual Therapy  Waunita Schooner 12/12/2015, 4:12 PM

## 2015-12-12 NOTE — Progress Notes (Signed)
Physical Therapy Weekly Progress Note  Patient Details  Name: [name] MRN: [id number] Date of Birth: 08-02-1940  Beginning of progress report period: Dec 05, 2015 End of progress report period: Dec 12, 2015  Today's Date: 12/12/2015 PT Individual Time: 1100-1200 PT Individual Time Calculation (min): 60 min   Patient has met 3 of 5 short term goals.  Pt requires min guard to S for all mobility with RW. Pt is limited primarily by pain, anxiety and fear of movement. However over the past several days pt has significantly increased ambulation distance, activity tolerance, and confidence with mobility. Pt ambulating up to 150' with S, short distances of 20-30' without AD and HHA, and standing tolerance of approximately 2-3 min before requiring seated rest break. Family is very supportive to encourage pt to be more active and reach maximum independence.   Patient continues to demonstrate the following deficits:  activity tolerance, balance, postural control, ability to compensate for deficits, awareness and knowledge of precautions and therefore will continue to benefit from skilled PT intervention to enhance overall performance with bed mobility, transfers, gait, stairs, home and community access.  Patient progressing toward long term goals..  Continue plan of care.  PT Short Term Goals Week 1:  PT Short Term Goal 1 (Week 1): Pt will perform bed mobility on flat bed, no rails with mod A and recall back precautions with 50% cues PT Short Term Goal 1 - Progress (Week 1): Met PT Short Term Goal 2 (Week 1): Pt will perform sit<> stand and stand pivot transfers with RW and min A consistently with 50% cues for safety and sequencing PT Short Term Goal 2 - Progress (Week 1): Met PT Short Term Goal 3 (Week 1): Pt will perform w/c mobility x 50' in controlled environment with supervision PT Short Term Goal 3 - Progress (Week 1): Not met PT Short Term Goal 4 (Week 1): Pt will perform 4 stair  negotiation with bilat rails and mod A PT Short Term Goal 4 - Progress (Week 1): Met PT Short Term Goal 5 (Week 1): Pt will ambulate with RW x 50' with min A and reporting pain at <5/10. PT Short Term Goal 5 - Progress (Week 1): Partly met Week 2:  PT Short Term Goal 1 (Week 2): =LTG due to estimated LOS  Skilled Therapeutic Interventions/Progress Updates:    Pt received in restroom with handoff from NT; c/o pain as below and agreeable to treatment. S for clothing management and peri hygiene with setup to retrieve and wet washcloth. Stand pivot transfer toilet>w/c with close S. Seated at sink, pt breathing heavily, sighing and requires increased time to initiate hand washing due to increased pain. Transported to gym totalA for energy conservation and pain management. Stand pivot transfer w/c >nustep with RW and S. Standing balance with toe taps to cones with RW and without UE assist; min/modA for standing balance due to pt fear, strong HHA on therapist for comfort/stability. Gait 2x50' with RW and close S. In room, gait with RW x15' to recliner with S. Daughter educated throughout session on importance of maintaining mobility at home; setting up environment to allow for short distance ambulation, situations that required pt to go to the activity/object instead of bringing things to pt. Daughter reports she has already started doing this in the hospital and pt has been getting frustrated. Pt remained seated in recliner, all needs within reach.   Therapy Documentation Precautions:  Precautions Precautions: Back, Fall Precaution Booklet Issued: No  Precaution Comments: 2 of 3 BAT recalled during OT eval Restrictions Weight Bearing Restrictions: No Pain: Pain Assessment Pain Assessment: 0-10 Pain Score: 10-Worst pain ever Pain Type: Chronic pain Pain Location: Back Pain Orientation: Lower Pain Descriptors / Indicators: Aching;Sore Pain Onset: With Activity Patients Stated Pain Goal: 3 Pain  Intervention(s): Repositioned;Ambulation/increased activity Multiple Pain Sites: No   See Function Navigator for Current Functional Status.  Therapy/Group: Individual Therapy  Luberta Mutter 12/12/2015, 12:12 PM

## 2015-12-12 NOTE — Progress Notes (Signed)
[facility] PHYSICAL MEDICINE & REHABILITATION     PROGRESS NOTE    Subjective/Complaints: Feeling better. Pain present but tolerable. Happy she's emptying bladder. In good spirits this morning  ROS: + Generalized pain. Denies CP, SOB, nausea, vomiting, diarrhea.  Objective: Vital Signs: Blood pressure 134/83, pulse 70, temperature 98.1 F (36.7 C), temperature source Oral, resp. rate 16, weight 93.033 kg (205 lb 1.6 oz), SpO2 98 %. No results found.  Recent Labs  12/10/15 1141  WBC 9.4  HGB 8.4*  HCT 26.1*  PLT 233    Recent Labs  12/10/15 0450 12/11/15 0501  NA 132* 135  K 4.1 4.0  CL 100* 102  GLUCOSE 103* 91  BUN 83* 81*  CREATININE 2.88* 2.69*  CALCIUM 9.1 9.0   CBG (last 3)   Recent Labs  12/11/15 1634 12/11/15 2207 12/12/15 0651  GLUCAP 139* 131* 96    Wt Readings from Last 3 Encounters:  12/11/15 93.033 kg (205 lb 1.6 oz)  12/01/15 86.183 kg (190 lb)  11/23/15 86.274 kg (190 lb 3.2 oz)    Physical Exam:   Nursing note and vitals reviewed. Constitutional: She appears well-developed and well-nourished. NAD.  HENT: Normocephalic and atraumatic.  Mouth/Throat: Oropharynx is clear and moist.  Eyes: Conjunctivae and EOM are normal.  Cardiovascular: Normal rate and regular rhythm.  Respiratory: Effort normal. No stridor.   GI: Soft. Nontender. Bowel sounds normal.  Musculoskeletal: She exhibits edema (1+ pedal edema and B-tibially) and tenderness.  Neurological: She is alert and oriented to person, place, and time.  Patient is very hard of hearing.  Motor: B/l UE 4+/5 proximal to distal B/l LE: Hip flexion, knee extension 3+/5, ankle dorsi/plantar flexion 4+/5  Skin: Skin is warm and dry.  Skin: Skin is warm and dry. No rash noted.  Back incision clean and dry with steristrps and foam dressing Psychiatric: She has a normal mood and affect. Her behavior is anxious  .  Assessment/Plan: 1. 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 Physiatrist is providing close team supervision and 24 hour management of active medical problems listed below. Physiatrist and rehab team continue to assess barriers to discharge/monitor patient progress toward functional and medical goals.  Function:  Bathing Bathing position Bathing activity did not occur: Refused Position: Production manager parts bathed by patient: Right arm, Left arm, Chest, Abdomen, Front perineal area, Right upper leg, Left upper leg, Right lower leg, Left lower leg, Buttocks Body parts bathed by helper: Back  Bathing assist Assist Level: Supervision or verbal cues      Upper Body Dressing/Undressing Upper body dressing   What is the patient wearing?: Pull over shirt/dress     Pull over shirt/dress - Perfomed by patient: Thread/unthread right sleeve, Thread/unthread left sleeve, Put head through opening, Pull shirt over trunk Pull over shirt/dress - Perfomed by helper: Pull shirt over trunk        Upper body assist Assist Level: Set up   Set up : To obtain clothing/put away  Lower Body Dressing/Undressing Lower body dressing   What is the patient wearing?: Pants, Underwear Underwear - Performed by patient: Thread/unthread right underwear leg, Thread/unthread left underwear leg, Pull underwear up/down Underwear - Performed by helper: Thread/unthread right underwear leg, Thread/unthread left underwear leg Pants- Performed by patient: Pull pants up/down, Thread/unthread right pants leg, Thread/unthread left pants leg Pants-  Performed by helper: Thread/unthread left pants leg Non-skid slipper socks- Performed by patient: Don/doff right sock, Don/doff left sock Non-skid slipper socks- Performed by helper: Don/doff right sock, Don/doff left sock               TED Hose - Performed by helper: Don/doff  right TED hose, Don/doff left TED hose  Lower body assist Assist for lower body dressing: Supervision or verbal cues      Toileting Toileting   Toileting steps completed by patient: Performs perineal hygiene, Adjust clothing after toileting Toileting steps completed by helper: Adjust clothing prior to toileting Toileting Assistive Devices: Grab bar or rail  Toileting assist Assist level: Supervision or verbal cues   Transfers Chair/bed transfer   Chair/bed transfer method: Ambulatory Chair/bed transfer assist level: Supervision or verbal cues Chair/bed transfer assistive device: Armrests, Medical sales representative     Max distance: 135 Assist level: Supervision or verbal cues   Wheelchair   Type: Manual Max wheelchair distance: 75 Assist Level: Supervision or verbal cues  Cognition Comprehension Comprehension assist level: Follows basic conversation/direction with extra time/assistive device  Expression Expression assist level: Expresses basic needs/ideas: With no assist  Social Interaction Social Interaction assist level: Interacts appropriately 90% of the time - Needs monitoring or encouragement for participation or interaction.  Problem Solving Problem solving assist level: Solves basic 90% of the time/requires cueing < 10% of the time  Memory Memory assist level: Recognizes or recalls 90% of the time/requires cueing < 10% of the time   Medical Problem List and Plan: 1. 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  -continue CIR therapies  -team conf today---on track for dc Friday potentially 2. DVT Prophylaxis/Anticoagulation: Pharmaceutical: Lovenox 3. Pain Management: pain improved today  -limit oxycodone due to effects on MS.  - Scheduled robaxin to help with muscle spasms and low dose ultram 50mg  qid  -. K pad additionally for local measures.  4. Mood: Needs a lot of encouragement and ego  support as is self limiting.   -anxiety a major issue. Added prn xanax.   -requested neuropsych.  5. Neuropsych: This patient is capable of making decisions on her own behalf. 6. Skin/Wound Care: Monitor wound daily for healing 7. Fluids/Electrolytes/Nutrition: Monitor I/O. 8. T2DM: Will monitor BS ac/hs. Continue lantus at bedtime with trajenta in am. Continue SSI for elevated BS.  9. CAD: Continue lopressor and lipitor.  10. Wheezing: denies increase in chronic SOB.  CXR without acute disease. 12. CKD with persistent BLE edema: (baseline around 3?)  -torsemide initiated yesterday per nephrology  -Appreciate nephrology consult and recs. Now BP appears to be elevated, will defer to nephro for management  -Renal ultrasound reviewed on 5/25 suggesting medical renal disease 13. Abdominal pain: KUB negative. Continue to work on regular bowel schedule.  Good bowel movements  14. Urinary retention:ua neg, cx negative. Foley out.   -now voiding with low PVR's 15. Acute on chronic anemia due to CKD: Fe++ and ESA 16. H/o gout: off colchicine, due to renal fxn 17. Thrombocytopenia: Cont to monitor, improving 18. Hyponatremia: Cont to monitor 19. AFib: Monitor HR with increased activity 20. HTN: Monitor with increased activity  Metoprolol increased to 100 on 5/29 21.  Right elbow pain: Significantly improved  Limited ROM on exam, no evidence of inflammation, elbow Xray showed lateral epicondyle calcifications, had many tennis elbow injections in past  Voltaren gel effective---continue  LOS (Days) 8 A  FACE TO FACE EVALUATION WAS PERFORMED  Charlesa Ehle T 12/12/2015 8:45 AM

## 2015-12-12 NOTE — Progress Notes (Signed)
Occupational Therapy Weekly Progress Note  Patient Details  Name: [name] MRN: [id number] Date of Birth: 1940/10/12  Beginning of progress report period: Dec 05, 2015 End of progress report period: Dec 12, 2015  Today's Date: 12/12/2015 OT Individual Time: 0930-1100 OT Individual Time Calculation (min): 90 min    Patient has met 4 of 4 short term goals.  Pt is progressing well with her ADLs, use of AE, following back precautions, and activity tolerance. She can now complete all sit to stands, mobility, and ADLs with S.  Patient continues to demonstrate the following deficits: LE and back pain, edema, low activity tolerance and therefore will continue to benefit from skilled OT intervention to enhance overall performance with BADL.  Patient progressing toward long term goals..  Continue plan of care.  The homemaking goal was discontinued as this is not a strong interest of pt's at this time. To be addressed through home health OT.  OT Short Term Goals Week 1:  OT Short Term Goal 1 (Week 1): Pt will complete lower body dressing using AE to maintain back precatuions 100% of the time OT Short Term Goal 1 - Progress (Week 1): Met OT Short Term Goal 2 (Week 1): Pt will complete toilet hygiene independently OT Short Term Goal 2 - Progress (Week 1): Met OT Short Term Goal 3 (Week 1): Pt will bathe, sitting and standing with steadying assist OT Short Term Goal 3 - Progress (Week 1): Met OT Short Term Goal 4 (Week 1): Pt will demo ability to perform light home making task using RW with min assist OT Short Term Goal 4 - Progress (Week 1): Discontinued (comment) (LTG of homemaking discontinued) Week 2:  OT Short Term Goal 1 (Week 2): STGs = LTGs  Skilled Therapeutic Interventions/Progress Updates:    Pt seen for skilled OT to facilitate activity tolerance and functional mobility during ADL training. Pt c/o leg pain in which she received medication. She was eager to shower. Pt needs extra  time due to need for frequent rest breaks, but is able to complete all self care except for TED hose with S using AE.  Daughter desires for her mom to have a shower chair with handles. Found a few options for her through an outside company she could order from. Discussed grab bar and use of memory foam shower mat.   Pt completed grooming in standing at sink and was then taken to gym for UE exercises. R shoulder RTC pain so focused on gentle AROM of shoulder with resisted (theraband level 1) elbow extension. Resisted exercises with band for RUE. Education and practice with postural exercises emphasizing shoulder retraction.  Pt in gym with hand off to next therapist.  Therapy Documentation Precautions:  Precautions Precautions: Back, Fall Precaution Booklet Issued: No Precaution Comments: 2 of 3 BAT recalled during OT eval Restrictions Weight Bearing Restrictions: No       Pain: Pain Assessment Pain Assessment: 0-10 Pain Score: 10-Worst pain ever Pain Type: Chronic pain Pain Location: Back Pain Orientation: Lower Pain Descriptors / Indicators: Aching Pain Onset: With Activity Patients Stated Pain Goal: 3 Pain Intervention(s): Medication (See eMAR) Multiple Pain Sites: No ADL: ADL ADL Comments: see Functional Assessment Tool  See Function Navigator for Current Functional Status.   Therapy/Group: Individual Therapy  [place] 12/12/2015, 12:36 PM

## 2015-12-12 NOTE — Addendum Note (Signed)
Addended by: Mena Goes on: 12/12/2015 04:53 PM   Modules accepted: Orders

## 2015-12-13 ENCOUNTER — Inpatient Hospital Stay (HOSPITAL_COMMUNITY): Payer: Medicare Other | Admitting: Physical Therapy

## 2015-12-13 ENCOUNTER — Inpatient Hospital Stay (HOSPITAL_COMMUNITY): Payer: Medicare Other | Admitting: Occupational Therapy

## 2015-12-13 ENCOUNTER — Inpatient Hospital Stay (HOSPITAL_COMMUNITY): Payer: Medicare Other

## 2015-12-13 ENCOUNTER — Encounter (HOSPITAL_COMMUNITY): Payer: Medicare Other

## 2015-12-13 LAB — GLUCOSE, CAPILLARY
GLUCOSE-CAPILLARY: 85 mg/dL (ref 65–99)
GLUCOSE-CAPILLARY: 114 mg/dL — AB (ref 65–99)
Glucose-Capillary: 122 mg/dL — ABNORMAL HIGH (ref 65–99)
Glucose-Capillary: 124 mg/dL — ABNORMAL HIGH (ref 65–99)

## 2015-12-13 NOTE — Progress Notes (Signed)
Physical Therapy Session Note  Patient Details  Name: [name] MRN: [id number] Date of Birth: 09/25/1940  Today's Date: 12/13/2015 PT Individual Time: 0900-1000 PT Individual Time Calculation (min): 60 min   Short Term Goals: Week 2:  PT Short Term Goal 1 (Week 2): =LTG due to estimated LOS  Skilled Therapeutic Interventions/Progress Updates:    Pt received seated in recliner, c/o pain as below and agreeable to treatment. Gait to bathroom with RW and S. Performed all clothing management and hygiene with S. W/c setup at sink to allow pt to sit while washing hands, however pt opted to stand at sink with S to perform hand hygiene. Seated rest break in chair x2 min. Gait 1x175', 1x70' with RW and S. Pt with improved gait speed, upright posture and less c/o pain than yesterday's session. Standing balance on foam 3x 2 min while performing BUE pipe tree for dynamic balance challenge, cognitive dual task, BUE coordination and reduced reliance on UEs for balance. Standing BUE reaching to retrieve and then throwing horseshoes to target; standbyA for balance. Gait x100' before limited by pain/fatigue; S with RW. Short distance gait in room to recliner with RW and S. Remained seated in recliner at completion of session, all needs within reach.   Therapy Documentation Precautions:  Precautions Precautions: Back, Fall Precaution Booklet Issued: No Precaution Comments: 2 of 3 BAT recalled during OT eval Restrictions Weight Bearing Restrictions: No Pain: Pain Assessment Pain Assessment: 0-10 Pain Score: 5  Faces Pain Scale: No hurt Pain Type: Chronic pain Pain Location: Back Pain Orientation: Lower Pain Descriptors / Indicators: Aching Pain Onset: On-going Patients Stated Pain Goal: 3 Pain Intervention(s): Ambulation/increased activity;Repositioned   See Function Navigator for Current Functional Status.   Therapy/Group: Individual Therapy  Luberta Mutter 12/13/2015, 10:00 AM

## 2015-12-13 NOTE — Progress Notes (Signed)
Social Work Patient ID: [name], female   DOB: 07-Aug-1940, 75 y.o.   MRN: [id number]   Lowella Curb, LCSW Social Worker Signed  Patient Care Conference 12/13/2015 11:19 AM    Expand All Collapse All   Inpatient RehabilitationTeam Conference and Plan of Care Update Date: 12/12/2015   Time: 2:05 PM     Patient Name: [name]       Medical Record Number: [id number]  Date of Birth: June 20, 1941 Sex: Female         Room/Bed: 4W22C/4W22C-01 Payor Info: Payor: MEDICARE / Plan: MEDICARE PART A AND B / Product Type: *No Product type* /    Admitting Diagnosis: Lumbar Microdiscectomy  Admit Date/Time:  12/04/2015  4:45 PM Admission Comments: No comment available   Primary Diagnosis:  <principal problem not specified> Principal Problem: <principal problem not specified>    Patient Active Problem List     Diagnosis  Date Noted   .  AKI (acute kidney injury) ([place])     .  Lumbar back pain with radiculopathy affecting left lower extremity  12/04/2015   .  Bilateral calf pain     .  Pain     .  Wheezing     .  Abnormality of gait     .  Radiculopathy of lumbosacral region     .  Post-operative pain     .  Type 2 diabetes mellitus with peripheral neuropathy (HCC)     .  Coronary artery disease involving native coronary artery of native heart without angina pectoris     .  CKD (chronic kidney disease)     .  AP (abdominal pain)     .  Urinary retention     .  Anemia of chronic disease     .  Acute blood loss anemia     .  Hx of gout     .  Thrombocytopenia ([place])     .  Hyponatremia     .  Persistent atrial fibrillation ([place])     .  Benign essential HTN     .  S/P lumbar discectomy  12/01/2015   .  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  Acute delirium     .  Acute encephalopathy     .  Chronic kidney disease (CKD), stage V ([place])  07/13/2015   .  Stroke (cerebrum) ([place])  07/13/2015   .  Hypertension      .  Glaucoma     .  GERD (gastroesophageal reflux disease)     .  Atrial fibrillation ([place])     .  CAD (coronary artery disease)     .  Depression     .  Carotid stenosis     .  Stroke ([place])     .  Diabetes mellitus without complication ([place])     .  Gastroesophageal reflux disease with esophagitis     .  Carotid artery stenosis  04/21/2015   .  Coronary arteriosclerosis  06/08/2014   .  Essential hypertension  06/08/2014   .  Hyperlipidemia  06/08/2014   .  End stage renal disease ([place])  05/18/2014   .  Occlusion and stenosis of carotid artery without mention of cerebral infarction  01/01/2012     Expected Discharge Date: Expected Discharge Date: 12/15/15  Team Members Present: Physician leading conference: Dr. Alger Simons Nurse Present:  Dorien Chihuahua, RN PT Present: Jorge Mandril, PT OT Present: Napoleon Form, OT SLP Present: Weston Anna, SLP PPS Coordinator present : Daiva Nakayama, RN, CRRN        Current Status/Progress  Goal  Weekly Team Focus   Medical     pain improving. now emptying bladder. renal function stabilized--nephrology following  pain control, increased mobility  renal mgt, pain and anxiety control    Bowel/Bladder     Cont x2; LBM 5/30; foley taken out 5/29, PVRs <50   Remain cont x2 while on Rehab  Continue toileting patient with appropriate devices    Swallow/Nutrition/ Hydration               ADL's     overall supervision with ADL transfers and ADLs, except A with TED hose and socks  Overall Supervision for B & D using AE, Mod I for toileting   ADL training, AE training, family education    Mobility     min guard-S for transfers, gait. Min/modA bed mobility, minA stairs   Supervision, minA stairs and bed mob   pain/anxiety management, activity tolerance, gait training with and without AD, dynamic standing balance   Communication               Safety/Cognition/ Behavioral Observations              Pain     scheduled tramadol, robaxin; PRN  tylenol; per family no oxy  < 4 on 0-10 pain scale  Assess and treat pain as patient continues therapy    Skin     Surgical site with steris and old drainage; MASD improving under abd skin fold  No new skin infection/breakdown while on Rehab   Continue to assess skin qshift    Rehab Goals Patient on target to meet rehab goals: Yes *See Care Plan and progress notes for long and short-term goals.    Barriers to Discharge:  size, behavioral issues, pain, edema/renal disease      Possible Resolutions to Barriers:   ongoing physical training. pacing, caregiver education      Discharge Planning/Teaching Needs:   Plan home with spouse who can provide 24/7 assistance   completing this week    Team Discussion:    Improved pain control and renal function. Making good gains in therapy and working on consistency.  Anxiety still an issue but lessened.  Ready for d/c end of week.   Revisions to Treatment Plan:    None    Continued Need for Acute Rehabilitation Level of Care: The patient requires daily medical management by a physician with specialized training in physical medicine and rehabilitation for the following conditions: Daily direction of a multidisciplinary physical rehabilitation program to ensure safe treatment while eliciting the highest outcome that is of practical value to the patient.: Yes Daily medical management of patient stability for increased activity during participation in an intensive rehabilitation regime.: Yes Daily analysis of laboratory values and/or radiology reports with any subsequent need for medication adjustment of medical intervention for : Wound care problems;Urological problems;Renal problems;Neurological problems  [name] 12/13/2015, 11:20 AM                 Lowella Curb, LCSW Social Worker Signed  Patient Care Conference 12/05/2015  3:58 PM    Expand All Collapse All   Inpatient RehabilitationTeam Conference and Plan of Care Update Date: 12/05/2015    Time: 2:10 PM     Patient Name: [name]  Medical Record Number: [id number]  Date of Birth: January 23, 1941 Sex: Female         Room/Bed: 4W22C/4W22C-01 Payor Info: Payor: MEDICARE / Plan: MEDICARE PART A AND B / Product Type: *No Product type* /    Admitting Diagnosis: Lumbar Microdiscectomy  Admit Date/Time:  12/04/2015  4:45 PM Admission Comments: No comment available   Primary Diagnosis:  <principal problem not specified> Principal Problem: <principal problem not specified>    Patient Active Problem List     Diagnosis  Date Noted   .  Lumbar back pain with radiculopathy affecting left lower extremity  12/04/2015   .  Bilateral calf pain     .  Pain     .  Wheezing     .  Abnormality of gait     .  Radiculopathy of lumbosacral region     .  Post-operative pain     .  Type 2 diabetes mellitus with peripheral neuropathy (HCC)     .  Coronary artery disease involving native coronary artery of native heart without angina pectoris     .  CKD (chronic kidney disease)     .  AP (abdominal pain)     .  Urinary retention     .  Anemia of chronic disease     .  Acute blood loss anemia     .  Hx of gout     .  Thrombocytopenia ([place])     .  Hyponatremia     .  Persistent atrial fibrillation ([place])     .  Benign essential HTN     .  S/P lumbar discectomy  12/01/2015   .  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  Acute delirium     .  Acute encephalopathy     .  Chronic kidney disease (CKD), stage V ([place])  07/13/2015   .  Stroke (cerebrum) ([place])  07/13/2015   .  Hypertension     .  Glaucoma     .  GERD (gastroesophageal reflux disease)     .  Atrial fibrillation ([place])     .  CAD (coronary artery disease)     .  Depression     .  Carotid stenosis     .  Stroke ([place])     .  Diabetes mellitus without complication ([place])     .  Gastroesophageal reflux disease with esophagitis     .  Carotid artery  stenosis  04/21/2015   .  Coronary arteriosclerosis  06/08/2014   .  Essential hypertension  06/08/2014   .  Hyperlipidemia  06/08/2014   .  End stage renal disease ([place])  05/18/2014   .  Occlusion and stenosis of carotid artery without mention of cerebral infarction  01/01/2012     Expected Discharge Date: Expected Discharge Date: 12/15/15  Team Members Present: Physician leading conference: Dr. Alger Simons Social Worker Present: Lennart Pall, LCSW Nurse Present: Heather Roberts, RN PT Present: Canary Brim, PT OT Present: Napoleon Form, OT SLP Present: Weston Anna, SLP PPS Coordinator present : Daiva Nakayama, RN, CRRN        Current Status/Progress  Goal  Weekly Team Focus   Medical     lumbar spine pain/radiculopathy s/p discectomy, pain issues, anemic  improve activity tolerance  wound care, pain mgt, electrolyte mgt,     Bowel/Bladder     cont x2;  LBM 5/22  Remain cont x2 while on Rehab  Continue toileting patient with appropriate devices    Swallow/Nutrition/ Hydration               ADL's     Min - Mod A with B & D, Mod A transfers   Overall Supervision for B & D using AE, Mod I for toileting   Adherence to back precautions, transfers, AE training, pain management, family ed   Mobility     Mod-max A overall  Supervision  pain/anxiety management, activity tolerance, transfers, gait and balance   Communication               Safety/Cognition/ Behavioral Observations              Pain     scheduled tramadol, PRN oxy  < 4 on 0-10 pain scale  Assess and treat pain as patient begins therapy    Skin     Surgical site with steris and foam   No new skin infection/breakdown while on Rehab   Continue to assess skin qshift    Rehab Goals Patient on target to meet rehab goals: Yes *See Care Plan and progress notes for long and short-term goals.    Barriers to Discharge:  pain, anemia, activity tolerance     Possible Resolutions to Barriers:   mgt of anemia, pain mgt,  strength/activity training      Discharge Planning/Teaching Needs:   Plan home with spouse who can provide 24/7 assistance   TBD    Team Discussion:    New eval.  Pain and anxiety are issues.  Mostly mod assist on eval and setting supervision to min assist goals.   Revisions to Treatment Plan:    None    Continued Need for Acute Rehabilitation Level of Care: The patient requires daily medical management by a physician with specialized training in physical medicine and rehabilitation for the following conditions: Daily direction of a multidisciplinary physical rehabilitation program to ensure safe treatment while eliciting the highest outcome that is of practical value to the patient.: Yes Daily medical management of patient stability for increased activity during participation in an intensive rehabilitation regime.: Yes Daily analysis of laboratory values and/or radiology reports with any subsequent need for medication adjustment of medical intervention for : Post surgical problems;Neurological problems;Wound care problems;Renal problems  Roy Snuffer 12/05/2015, 3:58 PM

## 2015-12-13 NOTE — Progress Notes (Signed)
Occupational Therapy Session Note  Patient Details  Name: [name] MRN: [id number] Date of Birth: 1940-12-06  Today's Date: 12/13/2015 OT Individual Time: 1045-1200 OT Individual Time Calculation (min): 75 min    Short Term Goals: Week 2:  OT Short Term Goal 1 (Week 2): STGs = LTGs  Skilled Therapeutic Interventions/Progress Updates:    Pt seen for OT ADL bathing/dresing session. Pt sitting up in recliner upon arrival, agreeable to tx session. She ambulated throughout room using RW with supervision She bathed seated on tub bench, completing sit <> stands with use of grab bar for buttock hygiene. Pt with loose BM when transferring out of shower, transferred to toilet to complete BM. She ambulated out of bathroom and completed dressing from w/c level with set-up assist and use of AE. Rest breaks required throughout due to decreased activity tolerance.   Pt ambulated throughout unit ~64ft with supervision using RW. She was unable to return to room due to fatigue, and required w/c assist to return to room.  Discussed at length bathroom set-up and DME with husband. Recommended grab bars to be installed in bathroom and location of shower chair in shower for optimum safety and accessibility. Discussed role of HHOT. Recommended to pt she use BSC for nighttime toileting needs as bathroom is not easily accessible and pt is high fall risk.  Pt left sitting in w/c set-up with meal tray and all needs in reach.   Therapy Documentation Precautions:  Precautions Precautions: Back, Fall Precaution Booklet Issued: No Precaution Comments: 2 of 3 BAT recalled during OT eval Restrictions Weight Bearing Restrictions: No Pain:   No/ denies pain ADL: ADL ADL Comments: see Functional Assessment Tool  See Function Navigator for Current Functional Status.   Therapy/Group: Individual Therapy  Lewis, Arion Morgan C 12/13/2015, 7:09 AM

## 2015-12-13 NOTE — Progress Notes (Signed)
Social Work Patient ID: [name], female   DOB: 1940-08-16, 75 y.o.   MRN: [id number]   Met yesterday afternoon with pt, spouse and daughter to review team conference.  All pleased with gains this past week and agree they are ready for d/c end of week.  Reviewed HH and DME needs.  No further concerns.  Veron Senner, LCSW

## 2015-12-13 NOTE — Progress Notes (Signed)
Physical Therapy Note  Patient Details  Name: [name] MRN: [id number] Date of Birth: 1941-02-02 Today's Date: 12/13/2015  1500-1600, 60 min individual tx Pain 7/10 Low back, premedicated  Husband and pt reported that she has diarrhea, and stomach hurts.  She was willing to work in room.  neuromuscular re-education via multimodal cues for 10 x 1 each  R/L: seated  long arc quad knee ext with 5 second hold; bil hip adduction against resistance of towel roll and bil hip abduction against yellow theraband, calf raises, toe raises, pelvic dissociation for reciprocal scooting without use of UEs, bil shoulder adduction.  Sit>< stand with supervision.  Standing with back of armchair for bil UE support  for: 5 x 1 each R/L hip abduction, toe raises.  Pt unable to stand more than 30 seconds due to feeling weak and increased back pain.  Instructed pt in self stretching bil hamstrings and heel cords in sitting with foot propped on foot stool, x 30 seconds x 2. PT reinforced slow deep breathing to manage pain and anxiety.  Pt able to state 3/3 back precautions accurately without cues  Pt left resting in recliner with all needs within reach, and husband present.   Lavere Stork 12/13/2015, 3:05 PM

## 2015-12-13 NOTE — Discharge Instructions (Signed)
Inpatient Rehab Discharge Instructions   [facility] Discharge date and time:    Activities/Precautions/ Functional Status: Activity: no lifting, driving, or strenuous exercise till cleared by MD. No bending, twisting or arching.  Diet: diabetic diet Wound Care: keep wound clean and dry. Contact Dr. Lorin Mercy if you develop redness, tenderness, drainage, fevers or chills.    Functional status:  ___ No restrictions     ___ Walk up steps independently ___ 24/7 supervision/assistance   ___ Walk up steps with assistance ___ Intermittent supervision/assistance  ___ Bathe/dress independently ___ Walk with walker     ___ Bathe/dress with assistance ___ Walk Independently    ___ Shower independently ___ Walk with assistance    ___ Shower with assistance ___ No alcohol     ___ Return to work/school ________   COMMUNITY REFERRALS UPON DISCHARGE:    Home Health:   PT    OT                      Agency:  [facility] Phone: [phone]   Medical Equipment/Items Ordered:   Transport wheelchair                                                        Agency/Supplier:  [facility] [phone]       Special Instructions:    My questions have been answered and I understand these instructions. I will adhere to these goals and the provided educational materials after my discharge from the hospital.  Patient/Caregiver Signature _______________________________ Date __________  Clinician Signature _______________________________________ Date __________  Please bring this form and your medication list with you to all your follow-up doctor's appointments.

## 2015-12-13 NOTE — Progress Notes (Signed)
[facility]     PROGRESS NOTE    Subjective/Complaints: In good spirits. Happy with progress and that she's going home Friday. Still some LE edema  ROS: + Generalized pain. Denies CP, SOB, nausea, vomiting, diarrhea.  Objective: Vital Signs: Blood pressure 168/59, pulse 76, temperature 98.3 F (36.8 C), temperature source Oral, resp. rate 18, weight 98.222 kg (216 lb 8.6 oz), SpO2 99 %. No results found.  Recent Labs  12/10/15 1141  WBC 9.4  HGB 8.4*  HCT 26.1*  PLT 233    Recent Labs  12/11/15 0501  NA 135  K 4.0  CL 102  GLUCOSE 91  BUN 81*  CREATININE 2.69*  CALCIUM 9.0   CBG (last 3)   Recent Labs  12/12/15 1636 12/12/15 2041 12/13/15 0650  GLUCAP 169* 144* 85    Wt Readings from Last 3 Encounters:  12/13/15 98.222 kg (216 lb 8.6 oz)  12/01/15 86.183 kg (190 lb)  11/23/15 86.274 kg (190 lb 3.2 oz)    Physical Exam:   Nursing note and vitals reviewed. Constitutional: She appears well-developed and well-nourished. NAD.  HENT: Normocephalic and atraumatic.  Mouth/Throat: Oropharynx is clear and moist.  Eyes: Conjunctivae and EOM are normal.  Cardiovascular: Normal rate and regular rhythm.  Respiratory: Effort normal. No stridor.   GI: Soft. Nontender. Bowel sounds normal.  Musculoskeletal: She exhibits edema (1+ pedal edema and B-tibially) and tenderness.  Neurological: She is alert and oriented to person, place, and time.  Patient is very hard of hearing.  Motor: B/l UE 4+/5 proximal to distal B/l LE: Hip flexion, knee extension 3+/5, ankle dorsi/plantar flexion 4+/5  Skin: Skin is warm and dry.  Skin: Skin is warm and dry. No rash noted.  Back incision clean and dry with steristrps and foam dressing Psychiatric: She has a normal mood and affect. Her behavior is anxious  .  Assessment/Plan: 1. 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 Physiatrist is providing close team supervision and 24 hour management of active medical problems listed below. Physiatrist and rehab team continue to assess barriers to discharge/monitor patient progress toward functional and medical goals.  Function:  Bathing Bathing position Bathing activity did not occur: Refused Position: Production manager parts bathed by patient: Right arm, Left arm, Chest, Abdomen, Front perineal area, Right upper leg, Left upper leg, Right lower leg, Left lower leg, Buttocks Body parts bathed by helper: Back  Bathing assist Assist Level: Supervision or verbal cues      Upper Body Dressing/Undressing Upper body dressing   What is the patient wearing?: Pull over shirt/dress     Pull over shirt/dress - Perfomed by patient: Thread/unthread right sleeve, Thread/unthread left sleeve, Put head through opening, Pull shirt over trunk Pull over shirt/dress - Perfomed by helper: Pull shirt over trunk        Upper body assist Assist Level: Set up   Set up : To obtain clothing/put away  Lower Body Dressing/Undressing Lower body dressing   What is the patient wearing?: Pants, Underwear, Ted Hose, Non-skid slipper socks Underwear - Performed by patient: Thread/unthread right underwear leg, Thread/unthread left underwear leg, Pull underwear up/down Underwear - Performed by helper: Thread/unthread right underwear leg, Thread/unthread left underwear leg Pants- Performed by patient: Pull pants up/down, Thread/unthread right pants leg, Thread/unthread left pants leg Pants- Performed by helper:  Thread/unthread left pants leg Non-skid slipper socks- Performed by patient: Don/doff right sock, Don/doff left sock Non-skid slipper socks- Performed by helper: Don/doff right sock, Don/doff left sock               TED Hose - Performed by helper: Don/doff right TED hose, Don/doff  left TED hose  Lower body assist Assist for lower body dressing: Supervision or verbal cues      Toileting Toileting   Toileting steps completed by patient: Performs perineal hygiene, Adjust clothing after toileting Toileting steps completed by helper: Adjust clothing prior to toileting Toileting Assistive Devices: Grab bar or rail  Toileting assist Assist level: Supervision or verbal cues   Transfers Chair/bed transfer   Chair/bed transfer method: Ambulatory Chair/bed transfer assist level: Supervision or verbal cues Chair/bed transfer assistive device: Armrests, Medical sales representative     Max distance: 170 ft Assist level: Touching or steadying assistance (Pt > 75%)   Wheelchair   Type: Manual Max wheelchair distance: 75 Assist Level: Supervision or verbal cues  Cognition Comprehension Comprehension assist level: Follows basic conversation/direction with extra time/assistive device  Expression Expression assist level: Expresses complex ideas: With no assist  Social Interaction Social Interaction assist level: Interacts appropriately with others with medication or extra time (anti-anxiety, antidepressant).  Problem Solving Problem solving assist level: Solves basic 90% of the time/requires cueing < 10% of the time  Memory Memory assist level: Recognizes or recalls 90% of the time/requires cueing < 10% of the time   Medical Problem List and Plan: 1. 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  -continue CIR therapies  -  for dc Friday   2. DVT Prophylaxis/Anticoagulation: Pharmaceutical: Lovenox 3. Pain Management: pain improved today  -limit oxycodone due to effects on MS.  - Scheduled robaxin to help with muscle spasms and low dose ultram 50mg  qid  -. K pad additionally for local measures.  4. Mood: Needs a lot of encouragement and ego support as is self limiting.   -anxiety a major issue.  Added prn xanax.   -has improved.  5. Neuropsych: This patient is capable of making decisions on her own behalf. 6. Skin/Wound Care: Monitor wound daily for healing 7. Fluids/Electrolytes/Nutrition: Monitor I/O. 8. T2DM: Will monitor BS ac/hs. Continue lantus at bedtime with trajenta in am. Continue SSI for elevated BS.  9. CAD: Continue lopressor and lipitor.  10. Wheezing: denies increase in chronic SOB.  CXR without acute disease. 12. CKD with persistent BLE edema: improving  -po lasix  -Appreciate nephrology consult and recs.  Follow up with [facility] as outpt  -Renal ultrasound reviewed on 5/25 suggesting medical renal disease 13. Abdominal pain: KUB negative. Continue to work on regular bowel schedule.  + bowel movements  14. Urinary retention:ua neg, cx negative. Foley out.   -now voiding with low PVR's 15. Acute on chronic anemia due to CKD: Fe++ and ESA 16. H/o gout: off colchicine, due to renal fxn 17. Thrombocytopenia: Cont to monitor, improving 18. Hyponatremia: Cont to monitor 19. AFib: Monitor HR with increased activity 20. HTN: Monitor with increased activity  Metoprolol increased to 100 on 5/29 21.  Right elbow pain: Significantly improved  -hx of tennis elbow  Voltaren gel effective---continue  LOS (Days) 9 A FACE TO FACE EVALUATION WAS PERFORMED  Shakai Dolley T 12/13/2015 9:12 AM

## 2015-12-13 NOTE — Patient Care Conference (Signed)
Inpatient RehabilitationTeam Conference and Plan of Care Update Date: 12/12/2015   Time: 2:05 PM    Patient Name: [name]      Medical Record Number: [id number]  Date of Birth: 1940/07/19 Sex: Female         Room/Bed: 4W22C/4W22C-01 Payor Info: Payor: MEDICARE / Plan: MEDICARE PART A AND B / Product Type: *No Product type* /    Admitting Diagnosis: Lumbar Microdiscectomy  Admit Date/Time:  12/04/2015  4:45 PM Admission Comments: No comment available   Primary Diagnosis:  <principal problem not specified> Principal Problem: <principal problem not specified>  Patient Active Problem List   Diagnosis Date Noted  . AKI (acute kidney injury) ([place])   . Lumbar back pain with radiculopathy affecting left lower extremity 12/04/2015  . Bilateral calf pain   . Pain   . Wheezing   . Abnormality of gait   . Radiculopathy of lumbosacral region   . Post-operative pain   . Type 2 diabetes mellitus with peripheral neuropathy (HCC)   . Coronary artery disease involving native coronary artery of native heart without angina pectoris   . CKD (chronic kidney disease)   . AP (abdominal pain)   . Urinary retention   . Anemia of chronic disease   . Acute blood loss anemia   . Hx of gout   . Thrombocytopenia ([place])   . Hyponatremia   . Persistent atrial fibrillation ([place])   . Benign essential HTN   . S/P lumbar discectomy 12/01/2015  . 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 Acute delirium   . Acute encephalopathy   . Chronic kidney disease (CKD), stage V ([place]) 07/13/2015  . 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 Hypertension   . Glaucoma   . GERD (gastroesophageal reflux disease)   . Atrial fibrillation ([place])   . CAD (coronary artery disease)   . Depression   . Carotid stenosis   . Stroke ([place])   . Diabetes mellitus without complication ([place])   . Gastroesophageal reflux disease with esophagitis   . Carotid  artery stenosis 04/21/2015  . Coronary arteriosclerosis 06/08/2014  . Essential hypertension 06/08/2014  . Hyperlipidemia 06/08/2014  . End stage renal disease ([place]) 05/18/2014  . Occlusion and stenosis of carotid artery without mention of cerebral infarction 01/01/2012    Expected Discharge Date: Expected Discharge Date: 12/15/15  Team Members Present: Physician leading conference: Dr. Alger Simons Nurse Present: Dorien Chihuahua, RN PT Present: Jorge Mandril, PT OT Present: Napoleon Form, OT SLP Present: Weston Anna, SLP PPS Coordinator present : Daiva Nakayama, RN, CRRN     Current Status/Progress Goal Weekly Team Focus  Medical   pain improving. now emptying bladder. renal function stabilized--nephrology following  pain control, increased mobility  renal mgt, pain and anxiety control   Bowel/Bladder   Cont x2; LBM 5/30; foley taken out 5/29, PVRs <50  Remain cont x2 while on Rehab  Continue toileting patient with appropriate devices   Swallow/Nutrition/ Hydration             ADL's   overall supervision with ADL transfers and ADLs, except A with TED hose and socks  Overall Supervision for B & D using AE, Mod I for toileting  ADL training, AE training, family education   Mobility   min guard-S for transfers, gait. Min/modA bed mobility, minA stairs   Supervision, minA stairs and bed mob  pain/anxiety management, activity tolerance, gait training with and without AD,  dynamic standing balance   Communication             Safety/Cognition/ Behavioral Observations            Pain   scheduled tramadol, robaxin; PRN tylenol; per family no oxy  < 4 on 0-10 pain scale  Assess and treat pain as patient continues therapy   Skin   Surgical site with steris and old drainage; MASD improving under abd skin fold  No new skin infection/breakdown while on Rehab  Continue to assess skin qshift    Rehab Goals Patient on target to meet rehab goals: Yes *See Care Plan and progress notes for  long and short-term goals.  Barriers to Discharge: size, behavioral issues, pain, edema/renal disease    Possible Resolutions to Barriers:  ongoing physical training. pacing, caregiver education    Discharge Planning/Teaching Needs:  Plan home with spouse who can provide 24/7 assistance  completing this week   Team Discussion:  Improved pain control and renal function. Making good gains in therapy and working on consistency.  Anxiety still an issue but lessened.  Ready for d/c end of week.  Revisions to Treatment Plan:  None   Continued Need for Acute Rehabilitation Level of Care: The patient requires daily medical management by a physician with specialized training in physical medicine and rehabilitation for the following conditions: Daily direction of a multidisciplinary physical rehabilitation program to ensure safe treatment while eliciting the highest outcome that is of practical value to the patient.: Yes Daily medical management of patient stability for increased activity during participation in an intensive rehabilitation regime.: Yes Daily analysis of laboratory values and/or radiology reports with any subsequent need for medication adjustment of medical intervention for : Wound care problems;Urological problems;Renal problems;Neurological problems  Ilias Stcharles 12/13/2015, 11:20 AM

## 2015-12-14 ENCOUNTER — Inpatient Hospital Stay (HOSPITAL_COMMUNITY): Payer: Medicare Other | Admitting: Physical Therapy

## 2015-12-14 ENCOUNTER — Inpatient Hospital Stay (HOSPITAL_COMMUNITY): Payer: Medicare Other | Admitting: Occupational Therapy

## 2015-12-14 DIAGNOSIS — E876 Hypokalemia: Secondary | ICD-10-CM

## 2015-12-14 DIAGNOSIS — R531 Weakness: Secondary | ICD-10-CM

## 2015-12-14 HISTORY — DX: Hypokalemia: E87.6

## 2015-12-14 HISTORY — DX: Weakness: R53.1

## 2015-12-14 LAB — GLUCOSE, CAPILLARY
GLUCOSE-CAPILLARY: 140 mg/dL — AB (ref 65–99)
Glucose-Capillary: 86 mg/dL (ref 65–99)
Glucose-Capillary: 111 mg/dL — ABNORMAL HIGH (ref 65–99)
Glucose-Capillary: 129 mg/dL — ABNORMAL HIGH (ref 65–99)

## 2015-12-14 NOTE — Progress Notes (Signed)
Physical Therapy Discharge Summary  Patient Details  Name: [name] MRN: [id number] Date of Birth: 1941-05-21  Today's Date: 12/14/2015 PT Individual Time: 1740-8144 and 1400-1500 PT Individual Time Calculation (min): 30 min and 60 min (total 90 min)    Patient has met 10 of 10 long term goals due to improved activity tolerance, improved balance, improved postural control, increased strength, decreased pain, ability to compensate for deficits, functional use of  right lower extremity and left lower extremity and improved awareness.  Patient to discharge at an ambulatory level Supervision.   Patient's care partner is independent to provide the necessary physical and cognitive assistance at discharge.  Reasons goals not met: All goals met  Recommendation:  Patient will benefit from ongoing skilled PT services in home health setting to continue to advance safe functional mobility, address ongoing impairments in strength, balance, activity tolerance, and minimize fall risk.  Equipment: Transport chair  Reasons for discharge: treatment goals met and discharge from hospital  Patient/family agrees with progress made and goals achieved: Yes  PT Discharge Precautions/Restrictions Precautions Precautions: Back;Fall Precaution Booklet Issued: No Precaution Comments: Cont to require VCs for adherence during functional tasks. Able to verbalize 3/3 BAT precautions Restrictions Weight Bearing Restrictions: No Pain Pain Assessment Pain Assessment: 0-10 Pain Score: 6  Faces Pain Scale: No hurt Pain Type: Chronic pain Pain Location: Back Pain Orientation: Lower Pain Descriptors / Indicators: Aching;Discomfort Pain Frequency: Constant Pain Onset: On-going Patients Stated Pain Goal: 3 Pain Intervention(s): Ambulation/increased activity;Repositioned Multiple Pain Sites: No Cognition Overall Cognitive Status: Within Functional Limits for tasks assessed Arousal/Alertness:  Awake/alert Orientation Level: Oriented X4 Memory: Appears intact Awareness: Appears intact Problem Solving: Appears intact Safety/Judgment: Appears intact Sensation Sensation Light Touch: Impaired Detail Light Touch Impaired Details: Impaired RLE;Impaired LLE Stereognosis: Not tested Hot/Cold: Not tested Proprioception: Impaired by gross assessment Additional Comments: Cont to have edema in B LEs, TED hose cont to be used.  Coordination Gross Motor Movements are Fluid and Coordinated: Yes Fine Motor Movements are Fluid and Coordinated: No (Slowed finger opposition, likely secondary to arthritis) Heel Shin Test: decreased excursion BLE d/t pain Motor  Motor Motor: Abnormal postural alignment and control Motor - Discharge Observations: Requires UE support for upirght posture/ alignment, forward flexed posture  Mobility Bed Mobility Bed Mobility: Supine to Sit;Sit to Supine Supine to Sit: 5: Supervision Supine to Sit Details (indicate cue type and reason): leg lifter Sit to Supine: 5: Supervision Sit to Supine - Details (indicate cue type and reason): leg lifter Transfers Transfers: Yes Sit to Stand: 5: Supervision Sit to Stand Details: Verbal cues for precautions/safety Stand to Sit: 5: Supervision Stand to Sit Details (indicate cue type and reason): Verbal cues for precautions/safety Stand Pivot Transfers: 5: Supervision Stand Pivot Transfer Details: Verbal cues for precautions/safety Locomotion  Ambulation Ambulation: Yes Ambulation/Gait Assistance: 5: Supervision Ambulation Distance (Feet): 175 Feet Assistive device: Rolling walker Ambulation/Gait Assistance Details: Verbal cues for precautions/safety;Verbal cues for safe use of DME/AE Gait Gait: Yes Gait Pattern: Impaired Gait Pattern: Poor foot clearance - left;Trunk flexed;Lateral trunk lean to right;Decreased stride length;Decreased stance time - left Gait velocity: 1.03 ft/sec Stairs / Additional  Locomotion Stairs: Yes Stairs Assistance: 5: Supervision Stair Management Technique: Two rails;Step to pattern Number of Stairs: 12 Height of Stairs: 6 Ramp: 5: Supervision Curb: 5: Supervision Wheelchair Mobility Wheelchair Mobility: No  Trunk/Postural Assessment  Cervical Assessment Cervical Assessment: Within Functional Limits Thoracic Assessment Thoracic Assessment: Exceptions to [facility] (Flexed posture; Spinal precautions) Lumbar Assessment Lumbar Assessment: Exceptions  to [facility] (Posterior pelvic tilt) Lumbar AROM Overall Lumbar AROM: Due to precautions Postural Control Postural Control: Within Functional Limits (With UE support during extended standing trials. )  Balance Balance Balance Assessed: Yes Standardized Balance Assessment Standardized Balance Assessment: Berg Balance Test Berg Balance Test Sit to Stand: Able to stand  independently using hands Standing Unsupported: Able to stand 2 minutes with supervision Sitting with Back Unsupported but Feet Supported on Floor or Stool: Able to sit safely and securely 2 minutes Stand to Sit: Uses backs of legs against chair to control descent Transfers: Able to transfer with verbal cueing and /or supervision Standing Unsupported with Eyes Closed: Able to stand 10 seconds with supervision Standing Ubsupported with Feet Together: Needs help to attain position and unable to hold for 15 seconds From Standing, Reach Forward with Outstretched Arm: Loses balance while trying/requires external support From Standing Position, Pick up Object from Floor: Unable to try/needs assist to keep balance (NT d/t back precautions) From Standing Position, Turn to Look Behind Over each Shoulder: Needs supervision when turning Turn 360 Degrees: Needs assistance while turning Standing Unsupported, Alternately Place Feet on Step/Stool: Able to complete >2 steps/needs minimal assist Standing Unsupported, One Foot in Front: Able to take small step  independently and hold 30 seconds Standing on One Leg: Unable to try or needs assist to prevent fall Total Score: 21 Static Sitting Balance Static Sitting - Balance Support: Feet supported Static Sitting - Level of Assistance: 6: Modified independent (Device/Increase time) Dynamic Sitting Balance Dynamic Sitting - Balance Support: Feet supported;During functional activity Dynamic Sitting - Level of Assistance: 6: Modified independent (Device/Increase time) Static Standing Balance Static Standing - Balance Support: During functional activity Static Standing - Level of Assistance: 5: Stand by assistance Dynamic Standing Balance Dynamic Standing - Balance Support: During functional activity Dynamic Standing - Level of Assistance: 5: Stand by assistance Extremity Assessment  RUE Assessment RUE Assessment: Within Functional Limits LUE Assessment LUE Assessment: Within Functional Limits RLE Assessment RLE Assessment: Exceptions to [facility] (hip flexion 4/5, knee extension/flexion 4+/5, ankle dorsiflexion 4+/5, ankle plantarflexion 4/5) LLE Assessment LLE Assessment: Exceptions to [facility] (hip flexion 4-/5, knee extension 4-/5, knee flexion 4/5, ankle dorsiflexion 4-/5, ankle plantarflexion 4/5)  Skilled Therapeutic Intervention: Tx 1: Pt received seated in recliner, c/o pain as described above and agreeable to treatment. Assessed stairs, car transfer, curb and gait on uneven surface as described above with S overall. Pt requires several rest breaks due to fatigue, however with overall improved endurance and activity tolerance. Gait 2x50' with RW and S; gait speed assessed at 1.03 ft/sec. Returned to room and performed gait with RW x15' w/c >recliner. Remained seated in recliner at completion of session, all needs within reach.    Tx 2: Pt received seated in recliner, denies pain and agreeable to treatment. Gait within room at beginning/end of session with RW and S recliner <> w/c. Assessed remainder of  mobility as described above including gait x175', bed mobility using leg lifter. Assessed Berg as above. Patient demonstrates increased fall risk as noted by score of   21/56 on Berg Balance Scale.  (<36= high risk for falls, close to 100%; 37-45 significant >80%; 46-51 moderate >50%; 52-55 lower >25%). Pt and family educated on high falls risk and recommendation that pt continue using RW at d/c; pt agreeable. Nustep x10 min on level 4, BLE with average  Gait 2x75' to return to room with pt back pain gradually increasing over the course of session. Remained seated in  recliner at completion of session, all needs within reach.     See Function Navigator for Current Functional Status.  Benjiman Core Tygielski 12/14/2015, 9:09 AM

## 2015-12-14 NOTE — Progress Notes (Signed)
[facility] PHYSICAL MEDICINE & REHABILITATION     PROGRESS NOTE    Subjective/Complaints: Feeling well today. Had a "rough" day yesterday with diarrhea after they gave me "that pill"  ROS: + Generalized pain. Denies CP, SOB, nausea, vomiting, diarrhea.  Objective: Vital Signs: Blood pressure 188/63, pulse 77, temperature 98 F (36.7 C), temperature source Oral, resp. rate 18, weight 84.188 kg (185 lb 9.6 oz), SpO2 98 %. No results found. No results for input(s): WBC, HGB, HCT, PLT in the last 72 hours. No results for input(s): NA, K, CL, GLUCOSE, BUN, CREATININE, CALCIUM in the last 72 hours.  Invalid input(s): CO CBG (last 3)   Recent Labs  12/13/15 1649 12/13/15 2041 12/14/15 0627  GLUCAP 122* 124* 86    Wt Readings from Last 3 Encounters:  12/14/15 84.188 kg (185 lb 9.6 oz)  12/01/15 86.183 kg (190 lb)  11/23/15 86.274 kg (190 lb 3.2 oz)    Physical Exam:   Nursing note and vitals reviewed. Constitutional: She appears well-developed and well-nourished. NAD.  HENT: Normocephalic and atraumatic.  Mouth/Throat: Oropharynx is clear and moist.  Eyes: Conjunctivae and EOM are normal.  Cardiovascular: Normal rate and regular rhythm.  Respiratory: Effort normal. No stridor.   GI: Soft. Nontender. Bowel sounds normal.  Musculoskeletal: She exhibits edema (1+ pedal edema and B-tibially) and tenderness.  Neurological: She is alert and oriented to person, place, and time.  Patient is very hard of hearing.  Motor: B/l UE 4+/5 proximal to distal B/l LE: Hip flexion, knee extension 3+/5, ankle dorsi/plantar flexion 4+/5  Skin: Skin is warm and dry.  Skin: Skin is warm and dry. No rash noted.  Back incision clean and dry with steristrps and foam dressing Psychiatric: She has a normal mood and affect. Her behavior is anxious  .  Assessment/Plan: 1. 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 Physiatrist is providing close team supervision and 24 hour management of active medical problems listed below. Physiatrist and rehab team continue to assess barriers to discharge/monitor patient progress toward functional and medical goals.  Function:  Bathing Bathing position Bathing activity did not occur: Refused Position: Production manager parts bathed by patient: Right arm, Left arm, Chest, Abdomen, Front perineal area, Right upper leg, Left upper leg, Right lower leg, Left lower leg, Buttocks, Back Body parts bathed by helper: Back  Bathing assist Assist Level: Supervision or verbal cues      Upper Body Dressing/Undressing Upper body dressing   What is the patient wearing?: Pull over shirt/dress     Pull over shirt/dress - Perfomed by patient: Thread/unthread right sleeve, Thread/unthread left sleeve, Put head through opening, Pull shirt over trunk Pull over shirt/dress - Perfomed by helper: Pull shirt over trunk        Upper body assist Assist Level: Set up   Set up : To obtain clothing/put away  Lower Body Dressing/Undressing Lower body dressing   What is the patient wearing?: Pants, Underwear, Ted Hose, Non-skid slipper socks Underwear - Performed by patient: Thread/unthread right underwear leg, Thread/unthread left underwear leg, Pull underwear up/down Underwear - Performed by helper: Thread/unthread right underwear leg, Thread/unthread left underwear leg Pants- Performed by patient: Pull pants up/down, Thread/unthread right pants leg, Thread/unthread left pants leg Pants- Performed by helper: Thread/unthread left pants leg Non-skid slipper socks- Performed by patient: Don/doff right sock, Don/doff left  sock Non-skid slipper socks- Performed by helper: Don/doff right sock, Don/doff left sock               TED Hose - Performed by helper: Don/doff right TED hose, Don/doff left  TED hose  Lower body assist Assist for lower body dressing: Supervision or verbal cues      Toileting Toileting   Toileting steps completed by patient: Performs perineal hygiene, Adjust clothing after toileting Toileting steps completed by helper: Adjust clothing prior to toileting Toileting Assistive Devices: Grab bar or rail  Toileting assist Assist level: Supervision or verbal cues   Transfers Chair/bed transfer   Chair/bed transfer method: Ambulatory Chair/bed transfer assist level: Supervision or verbal cues Chair/bed transfer assistive device: Armrests, Medical sales representative     Max distance: 175 Assist level: Supervision or verbal cues   Wheelchair   Type: Manual Max wheelchair distance: 75 Assist Level: Supervision or verbal cues  Cognition Comprehension Comprehension assist level: Follows basic conversation/direction with extra time/assistive device  Expression Expression assist level: Expresses basic needs/ideas: With no assist  Social Interaction Social Interaction assist level: Interacts appropriately 90% of the time - Needs monitoring or encouragement for participation or interaction.  Problem Solving Problem solving assist level: Solves basic 90% of the time/requires cueing < 10% of the time  Memory Memory assist level: Recognizes or recalls 90% of the time/requires cueing < 10% of the time   Medical Problem List and Plan: 1. 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  -continue CIR therapies  -  Finalize dc planning   2. DVT Prophylaxis/Anticoagulation: Pharmaceutical: Lovenox 3. Pain Management: pain improved today  -limit oxycodone due to effects on MS.  - Scheduled robaxin to help with muscle spasms and low dose ultram 50mg  qid  -. K pad additionally for local measures.  4. Mood: Needs a lot of encouragement and ego support as is self limiting.   -anxiety a major issue. Added  prn xanax.   -has improved.  5. Neuropsych: This patient is capable of making decisions on her own behalf. 6. Skin/Wound Care: Monitor wound daily for healing 7. Fluids/Electrolytes/Nutrition: Monitor I/O. 8. T2DM: Will monitor BS ac/hs. Continue lantus at bedtime with trajenta in am. Continue SSI for elevated BS.  9. CAD: Continue lopressor and lipitor.  10. Wheezing: denies increase in chronic SOB.  CXR without acute disease. 12. CKD with persistent BLE edema: improving  -po lasix  -Appreciate nephrology consult and recs.  Follow up with [facility]  -Renal ultrasound reviewed on 5/25 suggesting medical renal disease 13. Abdominal pain: KUB negative. Continue to work on regular bowel schedule.  + bowel movements   -hold colace and miralax 14. Urinary retention:ua neg, cx negative. Foley out.   -now voiding with low PVR's 15. Acute on chronic anemia due to CKD: Fe++ and ESA 16. H/o gout: off colchicine, due to renal fxn 17. Thrombocytopenia: Cont to monitor, improving 18. Hyponatremia: Cont to monitor 19. AFib: Monitor HR with increased activity 20. HTN: Bp's more elevated over last 2+ days.   Metoprolol increased to 100 on 5/29  -consider another agent. Given diarrhea/anxiety associated, I will not make changes today 21.  Right elbow pain: Significantly improved  -hx of tennis elbow  Voltaren gel effective---continue  LOS (Days) 10 A FACE TO FACE EVALUATION WAS PERFORMED  Borghild Thaker T 12/14/2015 8:27 AM

## 2015-12-14 NOTE — Progress Notes (Signed)
Occupational Therapy Discharge Summary  Patient Details  Name: [name] MRN: [id number] Date of Birth: 1940-10-15   Patient has met 8 of 8 long term goals due to improved activity tolerance, improved balance, postural control and ability to compensate for deficits.  Patient to discharge at overall Supervision level.  Patient's care partner is independent to provide the necessary physical assistance at discharge.    Family actively involved during [place] stay. Provided recommendation for using rw at all times and using a BSC at night beside of bed to prevent falls. Also, recommend pt prioritizing and taking rest breaks throughout day. She may be slightly limiting herself or have pain during activities, but once provided with distractors she does well and is usually able to finish activity and is motivated to do something else.    Recommendation:  Patient will benefit from ongoing skilled OT services in home health setting to continue to advance functional skills in the area of BADL and iADL.  Equipment: Reccomendation for shower chair, but pt family plans on buying one privately   Reasons for discharge: treatment goals met and discharge from hospital  Patient/family agrees with progress made and goals achieved: Yes  OT Discharge Precautions/Restrictions  Precautions Precautions: Back;Fall Precaution Comments: Cont to require VCs for adherence during functional tasks. Restrictions Weight Bearing Restrictions: No ADL ADL ADL Comments: see Functional Assessment Tool Vision/Perception  Vision- History Baseline Vision/History: Wears glasses Wears Glasses: At all times Patient Visual Report: No change from baseline  Cognition Overall Cognitive Status: Within Functional Limits for tasks assessed Arousal/Alertness: Awake/alert Orientation Level: Oriented X4 Memory: Appears intact Awareness: Appears intact Problem Solving: Appears intact Safety/Judgment: Appears  intact Sensation Sensation Light Touch: Impaired Detail Light Touch Impaired Details: Impaired RLE;Impaired LLE Additional Comments: Cont to have edema in B LEs, TED hose cont to be used.  Coordination Gross Motor Movements are Fluid and Coordinated: Yes Fine Motor Movements are Fluid and Coordinated: No (Slowed finger opposition, likely secondary to arthritis) Motor  Motor Motor: Abnormal postural alignment and control Motor - Discharge Observations: Requires UE support for upirght posture/ alignment Mobility     Trunk/Postural Assessment  Cervical Assessment Cervical Assessment: Within Functional Limits Thoracic Assessment Thoracic Assessment: Exceptions to [facility] (Flexed posture; Spinal precautions) Lumbar Assessment Lumbar Assessment: Exceptions to [facility] (Posterior pelvic tilt) Lumbar AROM Overall Lumbar AROM: Due to precautions Postural Control Postural Control: Within Functional Limits (With UE support during extended standing trials. )  Balance Balance Balance Assessed: Yes Static Sitting Balance Static Sitting - Balance Support: Feet supported Static Sitting - Level of Assistance: 6: Modified independent (Device/Increase time) Dynamic Sitting Balance Dynamic Sitting - Balance Support: Feet supported;During functional activity Dynamic Sitting - Level of Assistance: 6: Modified independent (Device/Increase time) Sitting balance - Comments: During seated bathing task Static Standing Balance Static Standing - Balance Support: During functional activity Static Standing - Level of Assistance: 5: Stand by assistance Dynamic Standing Balance Dynamic Standing - Balance Support: During functional activity Dynamic Standing - Level of Assistance: 5: Stand by assistance Dynamic Standing - Comments: During standing dressing and grooming tasks.  Extremity/Trunk Assessment RUE Assessment RUE Assessment: Within Functional Limits LUE Assessment LUE Assessment: Within Functional  Limits   See Function Navigator for Current Functional Status.  Lewis, Bobbette Eakes C 12/14/2015, 8:59 AM

## 2015-12-14 NOTE — Progress Notes (Signed)
Occupational Therapy Session Note  Patient Details  Name: [name] MRN: [id number] Date of Birth: August 31, 1940  Today's Date: 12/14/2015 OT Individual Time: [id number] and 1115-1200 OT Individual Time Calculation (min): 55 min and 45 min   Short Term Goals: Week 2:  OT Short Term Goal 1 (Week 2): STGs = LTGs  Skilled Therapeutic Interventions/Progress Updates:    Session one: Pt seen for OT ADL and dressing session. Upon entering room pt was sitting in recliner and agreeable to OT. Pt used walker to ambulate to pick out clothes and walk to shower.   OT provided education for functional management and safety of walker use.  Pt was able to doff clothes with supervision and the use of a AE for LE. Pt showered with supervision. OT provided verbal cues to remind pt of back precautions and educate on how to wash bottom without standing or using grab bars (since they will not be in the shower at home). Pt was able to bathe self using adaptive equipment to wash BLE.  Pt dressed upper body with supervision and used a reacher and sock aide for BLE. Pt brushed teeth and hair without rest breaks standing at the sink with with walker and sink for support. Pt ambulated around the room with little rest breaks showing increased endurance. Pt returned back to recliner to eat breakfest and was educated by the OT on ways to conserve energy throughout the day by pacing activities, planning ahead and prioritizing.   Session two: Upon arrival pt was sitting in recliner and agreeable to OT. Pt ambulated to apartment to practice getting into bed without breaking back precautions. Pt was able to recall 3/3 back precautions.OT provided instruction and demonstration on using leg loop to get into bed. Pt was able to return demonstration and practiced getting in and out of bed using leg loop. Following activity pt had to sit for several minutes due to back and neck pain. OTS encouraged pt to take deep breaths while OT went to  ask nurse for pain medication. Pt then practiced making up the bed without breaking back precautions being educated by the OT to use the reacher and bend at the knees not at the hip. OT encouraged pt that she will do a great job at home and will do great incorporating the things she has learned in therapy. Pt completed toileting with Mod I. OT provided education on using a BSC at night beside of bed to prevent falls. OT also encouraged pt to have [place] OT come to access environment and make recommendations for home management. Pt returned to room in recliner with husband and nurse tech present. Pt was educated on the needs of assist for mobility and to use call bell.   Therapy Documentation Precautions:  Precautions Precautions: Back, Fall Precaution Booklet Issued: No Precaution Comments: Cont to require VCs for adherence during functional tasks. Able to verbalize 3/3 BAT precautions Restrictions Weight Bearing Restrictions: No   Pain: Pain Assessment Pain Score: 3  Faces Pain Scale: Hurts even more Pain Location: Neck Pain Descriptors / Indicators: Aching Pain Intervention(s): RN made aware;Ambulation/increased activity;Repositioned ADL: ADL ADL Comments: see Functional Assessment Tool  See Function Navigator for Current Functional Status.   Therapy/Group: Individual Therapy  Matilde Bash 12/14/2015, 12:07 PM

## 2015-12-15 LAB — CBC
HCT: 30.7 % — ABNORMAL LOW (ref 36.0–46.0)
HEMOGLOBIN: 9.5 g/dL — AB (ref 12.0–15.0)
MCH: 30.3 pg (ref 26.0–34.0)
MCHC: 30.9 g/dL (ref 30.0–36.0)
MCV: 97.8 fL (ref 78.0–100.0)
PLATELETS: 270 10*3/uL (ref 150–400)
RBC: 3.14 MIL/uL — ABNORMAL LOW (ref 3.87–5.11)
RDW: 15.4 % (ref 11.5–15.5)
WBC: 9.3 10*3/uL (ref 4.0–10.5)

## 2015-12-15 LAB — BASIC METABOLIC PANEL
Anion gap: 7 (ref 5–15)
BUN: 46 mg/dL — AB (ref 6–20)
CALCIUM: 9.4 mg/dL (ref 8.9–10.3)
CHLORIDE: 105 mmol/L (ref 101–111)
CO2: 27 mmol/L (ref 22–32)
CREATININE: 1.99 mg/dL — AB (ref 0.44–1.00)
GFR, EST AFRICAN AMERICAN: 27 mL/min — AB (ref >60)
GFR, EST NON AFRICAN AMERICAN: 23 mL/min — AB (ref >60)
Glucose, Bld: 112 mg/dL — ABNORMAL HIGH (ref 65–99)
Potassium: 3.5 mmol/L (ref 3.5–5.1)
SODIUM: 139 mmol/L (ref 135–145)

## 2015-12-15 LAB — GLUCOSE, CAPILLARY: GLUCOSE-CAPILLARY: 115 mg/dL — AB (ref 65–99)

## 2015-12-15 MED ORDER — METOPROLOL TARTRATE 100 MG PO TABS
100.0000 mg | ORAL_TABLET | Freq: Two times a day (BID) | ORAL | Status: DC
Start: 2015-12-15 — End: 2016-09-16

## 2015-12-15 MED ORDER — FUROSEMIDE 80 MG PO TABS
80.0000 mg | ORAL_TABLET | Freq: Every day | ORAL | Status: DC
Start: 2015-12-15 — End: 2016-01-18

## 2015-12-15 MED ORDER — DICLOFENAC SODIUM 1 % TD GEL
2.0000 g | Freq: Four times a day (QID) | TRANSDERMAL | Status: DC
Start: 2015-12-15 — End: 2016-02-05

## 2015-12-15 MED ORDER — TRAMADOL HCL 50 MG PO TABS: 50.0000 mg | ORAL_TABLET | Freq: Three times a day (TID) | ORAL | Status: DC

## 2015-12-15 MED ORDER — ACETAMINOPHEN 325 MG PO TABS: 650.0000 mg | ORAL_TABLET | Freq: Four times a day (QID) | ORAL | Status: DC | PRN

## 2015-12-15 MED ORDER — INSULIN GLARGINE 100 UNIT/ML ~~LOC~~ SOLN: 20.0000 [IU] | Freq: Every day | SUBCUTANEOUS | Status: DC

## 2015-12-15 MED ORDER — METHOCARBAMOL 500 MG PO TABS: 500.0000 mg | ORAL_TABLET | Freq: Four times a day (QID) | ORAL | Status: DC

## 2015-12-15 MED ORDER — MUSCLE RUB 10-15 % EX CREA: 1.0000 "application " | TOPICAL_CREAM | Freq: Two times a day (BID) | CUTANEOUS | Status: DC

## 2015-12-15 MED ORDER — PRAZOSIN HCL 1 MG PO CAPS
1.0000 mg | ORAL_CAPSULE | Freq: Every day | ORAL | Status: DC
Start: 2015-12-15 — End: 2016-01-09

## 2015-12-15 MED ORDER — PRAZOSIN HCL 1 MG PO CAPS: 1.0000 mg | ORAL_CAPSULE | Freq: Every day | ORAL | Status: DC

## 2015-12-15 NOTE — Progress Notes (Signed)
Social Work  Discharge Note  The overall goal for the admission was met for:   Discharge location: Yes - home with spouse who, along with other family, will provide 24/7 assistance  Length of Stay: Yes - 11 days  Discharge activity level: Yes - supervision  Home/community participation: Yes  Services provided included: MD, RD, PT, OT, RN, TR, Pharmacy and [place]: Medicare and Private Insurance: [place]  Follow-up services arranged: Home Health: PT, OT via [place], DME: transport w/c via [place] and Patient/Family has no preference for HH/DME agencies  Comments (or additional information):  Patient/Family verbalized understanding of follow-up arrangements: Yes  Individual responsible for coordination of the follow-up plan: pt  Confirmed correct DME delivered: Duchess Armendarez 12/15/2015    Akshar Starnes

## 2015-12-15 NOTE — Discharge Summary (Signed)
Patient ID: [name] MRN: [id number] DOB/AGE: 03-03-1941 75 y.o.  Admit date: 12/01/2015 Discharge date: 12/15/2015  Admission Diagnoses:  Active Problems:   S/P lumbar discectomy   Discharge Diagnoses:  Active Problems:   S/P lumbar discectomy  status post Procedure(s): L5-S1 Decompression and Bilateral Microdiscectomy  Past Medical History  Diagnosis Date  . Anemia   . Glaucoma   . Hypertension   . Blood transfusion   . GERD (gastroesophageal reflux disease)   . Arthritis   . Neuromuscular disorder ([place])     CARPEL TUNNEL  . Atrial fibrillation ([place])   . CAD (coronary artery disease)   . Carotid artery occlusion     Carotid Endartectom,y - left 2009.  Blockage Right being watched by Dr Scot Dock.  Marland Kitchen HOH (hard of hearing)   . Shortness of breath   . Depression   . History of kidney stones     passed  . Carotid stenosis   . Complication of anesthesia     pt. states that she was difficult to wake  . Stroke ([place])     hx of TIA  . 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 Cancer ([place])     .  top of head- melonoma  . Dysrhythmia   . History of pneumonia   . Diabetes mellitus without complication ([place])   . Myocardial infarction ([place])   . History of hiatal hernia   . Pneumonia     Surgeries: Procedure(s): L5-S1 Decompression and Bilateral Microdiscectomy on 12/01/2015   Consultants:    Discharged Condition: Improved  Hospital Course: [name] is an 75 y.o. female who was admitted 12/01/2015 for operative treatment of lumbar stenosis/HNP. Patient failed conservative treatments (please see the history and physical for the specifics) and had severe unremitting pain that affects sleep, daily activities and work/hobbies. After pre-op clearance, the patient was taken to the operating room on 12/01/2015 and underwent  Procedure(s): L5-S1 Decompression and Bilateral Microdiscectomy.    Patient was given perioperative antibiotics: Anti-infectives     Start     Dose/Rate Route Frequency Ordered Stop   12/01/15 1830  ceFAZolin (ANCEF) IVPB 1 g/50 mL premix     1 g 100 mL/hr over 30 Minutes Intravenous Every 8 hours 12/01/15 1429 12/02/15 0702   12/01/15 1000  ceFAZolin (ANCEF) IVPB 2g/100 mL premix     2 g 200 mL/hr over 30 Minutes Intravenous To [facility] 11/30/15 1237 12/01/15 1017       Patient was given sequential compression devices and early ambulation to prevent DVT.   Patient benefited maximally from hospital stay and there were no complications. At the time of discharge, the patient was urinating/moving their bowels without difficulty, tolerating a regular diet, pain is controlled with oral pain medications and they have been cleared by PT/OT.   Recent vital signs: No data found.    Recent laboratory studies:  Recent Labs  12/15/15 0446  WBC 9.3  HGB 9.5*  HCT 30.7*  PLT 270  NA 139  K 3.5  CL 105  CO2 27  BUN 46*  CREATININE 1.99*  GLUCOSE 112*  CALCIUM 9.4     Discharge Medications:     Medication List    STOP taking these medications        acetaminophen 500 MG tablet  Commonly known as:  TYLENOL     amLODipine 10 MG tablet  Commonly known as:  NORVASC  colchicine 0.6 MG tablet     furosemide 40 MG tablet  Commonly known as:  LASIX     insulin glargine 100 UNIT/ML injection  Commonly known as:  LANTUS     metoprolol 50 MG tablet  Commonly known as:  LOPRESSOR     oxycodone 5 MG capsule  Commonly known as:  OXY-IR      TAKE these medications        allopurinol 100 MG tablet  Commonly known as:  ZYLOPRIM  Take 100 mg by mouth 2 (two) times daily.     aspirin 325 MG tablet  Take 325 mg by mouth daily.     atorvastatin 10 MG tablet  Commonly known as:  LIPITOR  TAKE ONE TABLET BY MOUTH ONE TIME DAILY     brimonidine 0.1 % Soln  Commonly known as:  ALPHAGAN P  Place 1 drop into the left eye 2 (two) times daily.     calcitRIOL 0.25 MCG capsule  Commonly known as:   ROCALTROL  Take 0.25 mcg by mouth every other day.     citalopram 20 MG tablet  Commonly known as:  CELEXA  Take 20 mg by mouth daily.     fish oil-omega-3 fatty acids 1000 MG capsule  Take 2 g by mouth daily.     HUMALOG 100 UNIT/ML injection  Generic drug:  insulin lispro  Inject 0-12 Units into the skin 3 (three) times daily with meals. Sliding scale     linagliptin 5 MG Tabs tablet  Commonly known as:  TRADJENTA  Take 1 tablet (5 mg total) by mouth daily with breakfast.     omeprazole 20 MG capsule  Commonly known as:  PRILOSEC  Take 20 mg by mouth daily as needed. Acid reflux     timolol 0.5 % ophthalmic solution  Commonly known as:  TIMOPTIC  Place 1 drop into the left eye daily.     travoprost (benzalkonium) 0.004 % ophthalmic solution  Commonly known as:  TRAVATAN  Place 1 drop into the left eye at bedtime.     VITAMIN B 12 PO  Take 1,000 mg by mouth.     VITAMIN B COMPLEX PO  Take 1 tablet by mouth daily.     Vitamin D 2000 units tablet  Take 2,000 Units by mouth 2 (two) times daily.        Diagnostic Studies: Dg Chest 2 View  12/04/2015  CLINICAL DATA:  Wheezing. EXAM: CHEST  2 VIEW COMPARISON:  Nov 23, 2015. FINDINGS: Stable cardiomediastinal silhouette. Status post coronary artery bypass graft. No pneumothorax or pleural effusion is noted. No acute pulmonary disease is noted. Bony thorax is unremarkable. IMPRESSION: No active cardiopulmonary disease. Electronically Signed   By: Marijo Conception, M.D.   On: 12/04/2015 20:26   Dg Chest 2 View  11/23/2015  CLINICAL DATA:  Preoperative study prior to lumbar surgery; history of coronary artery disease, CVA, and atrial fibrillation EXAM: CHEST  2 VIEW COMPARISON:  PA and lateral chest x-ray of July 13, 2015 FINDINGS: The lungs are hyperinflated with hemidiaphragm flattening. There is no focal infiltrate or pleural effusion. The cardiac silhouette is enlarged. The patient has undergone previous CABG. There is no  pulmonary vascular congestion. The sternal wires are intact. The retrosternal soft tissues appear normal. There is mild multilevel degenerative disc disease of the thoracic spine. IMPRESSION: Cardiomegaly without pulmonary edema. Mild hyperinflation which may be voluntary but likely reflects chronic bronchitic change or reactive airway disease.  There is no pneumonia. Electronically Signed   By: David  Martinique M.D.   On: 11/23/2015 13:46   Dg Lumbar Spine 2-3 Views  12/01/2015  CLINICAL DATA:  Portable imaging of the lumbar spine for lumbar spine surgical localization. EXAM: LUMBAR SPINE - 2-3 VIEW COMPARISON:  Lumbar MRI, 10/14/2015 FINDINGS: Initial image shows placement of a surgical needle posterior to the L4-L5 disc. Subsequent image shows placement of a surgical insert it between posterior skin retractors. The tip projects 3 cm posterior to the posterior margin of the L5-S1 disc. IMPRESSION: Surgical localization imaging as described. Electronically Signed   By: Lajean Manes M.D.   On: 12/01/2015 11:02   Dg Elbow 2 Views Right  12/09/2015  CLINICAL DATA:  Posterior elbow pain and swelling. EXAM: RIGHT ELBOW - 2 VIEW COMPARISON:  None. FINDINGS: There is no evidence of fracture, dislocation, or joint effusion. There is no evidence of arthropathy or other focal bone abnormality. Dystrophic calcification at the origin of the right common extensor tendon origin. There is peripheral vascular atherosclerotic disease. IMPRESSION: No acute osseous injury of the right elbow. Electronically Signed   By: Kathreen Devoid   On: 12/09/2015 11:50   Dg Abd 1 View  12/04/2015  CLINICAL DATA:  Abdominal pain. EXAM: ABDOMEN - 1 VIEW COMPARISON:  None. FINDINGS: The bowel gas pattern is normal. No radio-opaque calculi or other significant radiographic abnormality are seen. IMPRESSION: No evidence of bowel obstruction or ileus. Electronically Signed   By: Marijo Conception, M.D.   On: 12/04/2015 20:28   US Renal  12/07/2015   CLINICAL DATA:  75 year old female with acute kidney injury on chronic kidney disease. EXAM: RENAL / URINARY TRACT ULTRASOUND COMPLETE COMPARISON:  None. FINDINGS: Right Kidney: Length: 12 cm. Cortical atrophy noted with increased renal echogenicity. There is no evidence of hydronephrosis or solid mass. A 1.2 cm cyst is present. Left Kidney: Length: 10.6 cm. Cortical atrophy noted with increased renal echogenicity. There is no evidence of hydronephrosis or solid mass. Bladder: A Foley catheter is present within a collapsed bladder. IMPRESSION: Bilateral renal cortical atrophy and increased renal echogenicity compatible with medical renal disease. No evidence of hydronephrosis. Electronically Signed   By: Margarette Canada M.D.   On: 12/07/2015 16:45        Discharge Instructions    Call MD / Call 911    Complete by:  As directed   If you experience chest pain or shortness of breath, CALL 911 and be transported to the hospital emergency room.  If you develope a fever above 101 F, pus (white drainage) or increased drainage or redness at the wound, or calf pain, call your surgeon's office.     Call MD / Call 911    Complete by:  As directed   If you experience chest pain or shortness of breath, CALL 911 and be transported to the hospital emergency room.  If you develope a fever above 101 F, pus (white drainage) or increased drainage or redness at the wound, or calf pain, call your surgeon's office.     Constipation Prevention    Complete by:  As directed   Drink plenty of fluids.  Prune juice may be helpful.  You may use a stool softener, such as Colace (over the counter) 100 mg twice a day.  Use MiraLax (over the counter) for constipation as needed.     Constipation Prevention    Complete by:  As directed   Drink plenty of fluids.  Prune juice may be helpful.  You may use a stool softener, such as Colace (over the counter) 100 mg twice a day.  Use MiraLax (over the counter) for constipation as needed.      Diet - low sodium heart healthy    Complete by:  As directed      Diet - low sodium heart healthy    Complete by:  As directed      Increase activity slowly as tolerated    Complete by:  As directed      Increase activity slowly as tolerated    Complete by:  As directed            Follow-up Information    Follow up with YATES,MARK C, MD In 1 week.   Specialty:  Orthopedic Surgery   Contact information:   [place] [address] [phone]       Follow up with [facility].   Why:  someone from [facility] will contact you to arrange start date and time for therapy.   Contact information:   [address] [place] [address] [phone]       Discharge Plan:  discharge to home  Disposition:     Signed: Lanae Crumbly for Rodell Perna MD 12/15/2015, 6:16 PM

## 2015-12-15 NOTE — Discharge Summary (Signed)
Physician Discharge Summary  Patient ID: [name] MRN: [id number] DOB/AGE: [birth year] 75 y.o.  Admit date: 12/04/2015 Discharge date: 12/15/2015  Discharge Diagnoses:  Active Problems:   Lumbar back pain with radiculopathy affecting left lower extremity   Bilateral calf pain   Pain   Wheezing   Abnormality of gait   Radiculopathy of lumbosacral region   Post-operative pain   Type 2 diabetes mellitus with peripheral neuropathy (HCC)   Coronary artery disease involving native coronary artery of native heart without angina pectoris   CKD (chronic kidney disease)   AP (abdominal pain)   Urinary retention   Anemia of chronic disease   Acute blood loss anemia   Hx of gout   Thrombocytopenia (HCC)   Hyponatremia   Persistent atrial fibrillation (HCC)   Benign essential HTN   AKI (acute kidney injury) ([facility])   Discharged Condition:  Stable.   Significant Diagnostic Studies: Dg Chest 2 View  12/04/2015  CLINICAL DATA:  Wheezing. EXAM: CHEST  2 VIEW COMPARISON:  Nov 23, 2015. FINDINGS: Stable cardiomediastinal silhouette. Status post coronary artery bypass graft. No pneumothorax or pleural effusion is noted. No acute pulmonary disease is noted. Bony thorax is unremarkable. IMPRESSION: No active cardiopulmonary disease. Electronically Signed   By: Marijo Conception, M.D.   On: 12/04/2015 20:26    Dg Abd 1 View  12/04/2015  CLINICAL DATA:  Abdominal pain. EXAM: ABDOMEN - 1 VIEW COMPARISON:  None. FINDINGS: The bowel gas pattern is normal. No radio-opaque calculi or other significant radiographic abnormality are seen. IMPRESSION: No evidence of bowel obstruction or ileus. Electronically Signed   By: Marijo Conception, M.D.   On: 12/04/2015 20:28   US Renal  12/07/2015  CLINICAL DATA:  75 year old female with acute kidney injury on chronic kidney disease. EXAM: RENAL / URINARY TRACT ULTRASOUND COMPLETE COMPARISON:  None. FINDINGS: Right Kidney: Length: 12 cm. Cortical atrophy noted with  increased renal echogenicity. There is no evidence of hydronephrosis or solid mass. A 1.2 cm cyst is present. Left Kidney: Length: 10.6 cm. Cortical atrophy noted with increased renal echogenicity. There is no evidence of hydronephrosis or solid mass. Bladder: A Foley catheter is present within a collapsed bladder. IMPRESSION: Bilateral renal cortical atrophy and increased renal echogenicity compatible with medical renal disease. No evidence of hydronephrosis. Electronically Signed   By: Margarette Canada M.D.   On: 12/07/2015 16:45    Labs:  Basic Metabolic Panel:  Recent Labs Lab 12/09/15 1059 12/10/15 0450 12/11/15 0501 12/15/15 0446  NA 130* 132* 135 139  K 4.0 4.1 4.0 3.5  CL 95* 100* 102 105  CO2 19* 22 23 27   GLUCOSE 144* 103* 91 112*  BUN 83* 83* 81* 46*  CREATININE 3.06* 2.88* 2.69* 1.99*  CALCIUM 8.8* 9.1 9.0 9.4  PHOS  --  4.8* 4.6  --     CBC:  Recent Labs Lab 12/10/15 1141 12/15/15 0446  WBC 9.4 9.3  HGB 8.4* 9.5*  HCT 26.1* 30.7*  MCV 95.3 97.8  PLT 233 270    CBG:  Recent Labs Lab 12/14/15 0627 12/14/15 1205 12/14/15 1645 12/14/15 2036 12/15/15 0636  GLUCAP 86 111* 140* 129* 115*    Brief HPI:   [name] is a 75 y.o. female with history of HTN, CAD s/p CABG with brief PAF, depression, encephalopathy due to AKI, T2DM with neuropathy, right CAS, CKD-stage IV (Dr. 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 She elected to undergo bilateral  L5-S1 microdiskectomy by Dr. Lorin Mercy on 12/02/15. Post op has had back pain, inability to recall back precautions with poor safety as well as LLE instability affecting mobility. She has also had problems with urinary retention requiring intermittent caths and ABLA. BLE dopplers done due to complaints of bilateral calf pain with LE edema and were negative for DVT. Therapy ongoing and CIR recommended by Rehab team.    Hospital Course: [name] was admitted to rehab 12/04/2015 for inpatient  therapies to consist of PT, ST and OT at least three hours five days a week. Past admission physiatrist, therapy team and rehab RN have worked together to provide customized collaborative inpatient rehab. She continued to have high levels of anxiety, pain and lethargy affecting mobility.  Team has provided ego support with great improvement in participation and mood. She was started on bowel regimen to help with OIC and this has resolved. .   She continued to have problems with urinary retention requiring I/O caths as well as worsening of renal status. Renal ultrasound was done and was negative for obstruction.  Dr. Jimmy Footman was consulted and felt that patient with AKI due to low blood pressures and BP meds were discontinued.  Her renal status has slowly improved and foley was discontinued with voiding monitored with PVR check. She is voiding without retention but has had increase in BLE edema. Lasix was resumed and titrated to 80 mg daily. As blood pressures are trending upwards minipress was added for tighter control. She is to follow up with Dr. Mercy Moore for further titration of medications.   Po intake has been good and she is currently continent of bowel and bladder. Diabetes has been monitored with ac/hs checks and blood sugars are improving. She is to continue home dose lantus 20-30 units depending on BS with trajenta daily.  CBC done past admission and revealed acute on chronic anemia with hgb down to 8.4. Marland Kitchen She received dose of IV feramheme and aranesp for supplementation on 05/29. She is to resume her outpatient schedule at short stay after discharge.  Back incision has been monitored and is healing well without signs of infection. Pain has been managed with use of low dose ultram and robaxin. Lethargy has resolved with discontinuation of oxycodone.  She has had improvement in activity tolerance and is currently at supervision level.  She continues to require rest breaks as well as cues for safety. She  will continue to receive follow up [place], [place] and [place] by [facility] after discharge.    Rehab course: During patient's stay in rehab weekly team conferences were held to monitor patient's progress, set goals and discuss barriers to discharge.  At admission, patient required moderate to max assist with mobility and moderate assist with ADL tasks. She has had improvement in activity tolerance, balance and safety.  She is able to to complete ADL tasks with supervision. She is able to transfer and ambulate 52' with RW/supervision.  Family education was done with husband who will assist as needed after discharge.     Disposition: 01-Home or Self Care  Diet: Diabetic diet. Low salt.   Special Instructions: 1. HHRN to draw BMET on 06/8 with results to Dr. Mercy Moore. 2.  Contact short stay for  Aransep injection in 2 weeks.  3. No driving, lifting or strenuous activity till cleared by MD.    Discharge Instructions    Ambulatory referral to Physical Medicine Rehab    Complete by:  As directed   1-2  week follow up/lumbar stenosis            Medication List    STOP taking these medications        amLODipine 10 MG tablet  Commonly known as:  NORVASC     colchicine 0.6 MG tablet     oxycodone 5 MG capsule  Commonly known as:  OXY-IR     oxyCODONE-acetaminophen 5-325 MG tablet  Commonly known as:  ROXICET      TAKE these medications        acetaminophen 325 MG tablet  Commonly known as:  TYLENOL  Take 2 tablets (650 mg total) by mouth every 6 (six) hours as needed for mild pain.     allopurinol 100 MG tablet  Commonly known as:  ZYLOPRIM  Take 100 mg by mouth 2 (two) times daily.     aspirin 325 MG tablet  Take 325 mg by mouth daily.     atorvastatin 10 MG tablet  Commonly known as:  LIPITOR  TAKE ONE TABLET BY MOUTH ONE TIME DAILY     brimonidine 0.1 % Soln  Commonly known as:  ALPHAGAN P  Place 1 drop into the left eye 2 (two) times daily.     calcitRIOL 0.25  MCG capsule  Commonly known as:  ROCALTROL  Take 0.25 mcg by mouth every other day.     citalopram 20 MG tablet  Commonly known as:  CELEXA  Take 20 mg by mouth daily.     diclofenac sodium 1 % Gel  Commonly known as:  VOLTAREN  Apply 2 g topically 4 (four) times daily.     fish oil-omega-3 fatty acids 1000 MG capsule  Take 2 g by mouth daily.     furosemide 80 MG tablet  Commonly known as:  LASIX  Take 1 tablet (80 mg total) by mouth daily.     HUMALOG 100 UNIT/ML injection  Generic drug:  insulin lispro  Inject 0-12 Units into the skin 3 (three) times daily with meals. Sliding scale     insulin glargine 100 UNIT/ML injection  Commonly known as:  LANTUS  Inject 0.2-0.3 mLs (20-30 Units total) into the skin at bedtime.     linagliptin 5 MG Tabs tablet  Commonly known as:  TRADJENTA  Take 1 tablet (5 mg total) by mouth daily with breakfast.     methocarbamol 500 MG tablet Rx# 120  Pills   Commonly known as:  ROBAXIN  Take 1 tablet (500 mg total) by mouth 4 (four) times daily. Wean to one pill four times a day as needed after a week.     metoprolol 100 MG tablet  Commonly known as:  LOPRESSOR  Take 1 tablet (100 mg total) by mouth 2 (two) times daily.     MUSCLE RUB 10-15 % Crea  Apply 1 application topically 2 (two) times daily.     omeprazole 20 MG capsule  Commonly known as:  PRILOSEC  Take 20 mg by mouth daily as needed. Acid reflux     prazosin 1 MG capsule  Commonly known as:  MINIPRESS  Take 1 capsule (1 mg total) by mouth at bedtime.     timolol 0.5 % ophthalmic solution  Commonly known as:  TIMOPTIC  Place 1 drop into the left eye daily.     traMADol 50 MG tablet--Rx # 120 pills  Commonly known as:  ULTRAM  Take 1 tablet (50 mg total) by mouth 4 (four) times daily - after meals  and at bedtime.     travoprost (benzalkonium) 0.004 % ophthalmic solution  Commonly known as:  TRAVATAN  Place 1 drop into the left eye at bedtime.     VITAMIN B 12 PO   Take 1,000 mg by mouth.     VITAMIN B COMPLEX PO  Take 1 tablet by mouth daily.     Vitamin D 2000 units tablet  Take 2,000 Units by mouth 2 (two) times daily.           Follow-up Information    Follow up with Meredith Staggers, MD.   Specialty:  Physical Medicine and Rehabilitation   Why:  office will call you for follow up appointment/ Address after June 8 th: [address], [address]. [place], [place] [address]. [phone]   Contact information:   [address], [place] [address] [phone]       Follow up with Marybelle Killings, MD. Call today.   Specialty:  Orthopedic Surgery   Why:  for post op follow up appointment   Contact information:   [place] [address] [phone]       Follow up with Henrine Screws, MD On 12/22/2015.   Specialty:  Internal Medicine   Why:  @ 2:45 pm  (hospital follow up)   Contact information:   [address] [place] [address] [phone]       Follow up with Windy Kalata, MD. Call today.   Specialty:  Nephrology   Why:  for follow up appointment in 2-3 weeks.    Contact information:   [place]  [address] [phone]       Signed: Bary Leriche 12/15/2015, 10:02 AM

## 2015-12-15 NOTE — Progress Notes (Signed)
Pt. Got d/c papers and instructions,pt. Is ready to go home with her husband.

## 2015-12-15 NOTE — Progress Notes (Addendum)
[facility]IC[facility] RE[facility]ABILITATION     PROGRESS NOTE    Subjective/Complaints: In good spirits. Happy to go home. No further diarrhea. Back sore last night  ROS: + Generalized pain. Denies CP, SOB, nausea, vomiting,  .  Objective: Vital Signs: Blood pressure 191/68, pulse 80, temperature 98.6 F (37 C), temperature source Oral, resp. rate 18, weight 87 kg (191 lb 12.8 oz), SpO2 97 %. No results found.  Recent Labs  12/15/15 0446  WBC 9.3  HGB 9.5*  HCT 30.7*  PLT 270    Recent Labs  12/15/15 0446  NA 139  K 3.5  CL 105  GLUCOSE 112*  BUN 46*  CREATININE 1.99*  CALCIUM 9.4   CBG (last 3)   Recent Labs  12/14/15 1645 12/14/15 2036 12/15/15 0636  GLUCAP 140* 129* 115*    Wt Readings from Last 3 Encounters:  12/15/15 87 kg (191 lb 12.8 oz)  12/01/15 86.183 kg (190 lb)  11/23/15 86.274 kg (190 lb 3.2 oz)    Physical Exam:   Nursing note and vitals reviewed. Constitutional: She appears well-developed and well-nourished. NAD.  HENT: Normocephalic and atraumatic.  Mouth/Throat: Oropharynx is clear and moist.  Eyes: Conjunctivae and EOM are normal.  Cardiovascular: Normal rate and regular rhythm.  Respiratory: Effort normal. No stridor.   GI: Soft. Nontender. Bowel sounds normal.  Musculoskeletal: She exhibits edema (1+ pedal edema and B-tibially) and tenderness.  Neurological: She is alert and oriented to person, place, and time.  Patient is very hard of hearing.  Motor: B/l UE 4+/5 proximal to distal B/l LE: Hip flexion, knee extension 3+/5, ankle dorsi/plantar flexion 4+/5  Skin: Skin is warm and dry.  Skin: Skin is warm and dry. No rash noted.  Back incision clean and dry with steristrps and foam dressing Psychiatric: She has a normal mood and affect. Her behavior is anxious  .  Assessment/Plan: 1. Gait and functional deficits secondary to bilateral HNP of L5-S1 with radicu s/p microdisckectomy which require 3+ hours per day of  interdisciplinary therapy in a comprehensive inpatient rehab setting. Physiatrist is providing close team supervision and 24 hour management of active medical problems listed below. Physiatrist and rehab team continue to assess barriers to discharge/monitor patient progress toward functional and medical goals.  Function:  Bathing Bathing position Bathing activity did not occur: Refused Position: Production manager parts bathed by patient: Right arm, Left arm, Chest, Abdomen, Front perineal area, Right upper leg, Left upper leg, Right lower leg, Left lower leg, Buttocks, Back Body parts bathed by helper: Back  Bathing assist Assist Level: Supervision or verbal cues      Upper Body Dressing/Undressing Upper body dressing   What is the patient wearing?: Pull over shirt/dress     Pull over shirt/dress - Perfomed by patient: Thread/unthread right sleeve, Thread/unthread left sleeve, Put head through opening, Pull shirt over trunk Pull over shirt/dress - Perfomed by helper: Pull shirt over trunk        Upper body assist Assist Level: More than reasonable time   Set up : To obtain clothing/put away  Lower Body Dressing/Undressing Lower body dressing   What is the patient wearing?: Pants, Underwear, Ted Hose, Non-skid slipper socks Underwear - Performed by patient: Thread/unthread right underwear leg, Thread/unthread left underwear leg, Pull underwear up/down Underwear - Performed by helper: Thread/unthread right underwear leg, Thread/unthread left underwear leg Pants- Performed by patient: Pull pants up/down, Thread/unthread right pants leg, Thread/unthread left pants leg Pants- Performed  by helper: Thread/unthread left pants leg Non-skid slipper socks- Performed by patient: Don/doff right sock, Don/doff left sock Non-skid slipper socks- Performed by helper: Don/doff right sock, Don/doff left sock               TED Hose - Performed by helper: Don/doff right TED hose,  Don/doff left TED hose  Lower body assist Assist for lower body dressing: Supervision or verbal cues      Toileting Toileting   Toileting steps completed by patient: Adjust clothing prior to toileting, Performs perineal hygiene, Adjust clothing after toileting Toileting steps completed by helper: Adjust clothing prior to toileting Toileting Assistive Devices: Grab bar or rail  Toileting assist Assist level: Supervision or verbal cues   Transfers Chair/bed transfer   Chair/bed transfer method: Ambulatory Chair/bed transfer assist level: Supervision or verbal cues Chair/bed transfer assistive device: Armrests, Medical sales representative     Max distance: 175 Assist level: Supervision or verbal cues   Wheelchair Wheelchair activity did not occur: N/A Type: Manual Max wheelchair distance: 75 Assist Level: Supervision or verbal cues  Cognition Comprehension Comprehension assist level: Follows basic conversation/direction with extra time/assistive device  Expression Expression assist level: Expresses basic needs/ideas: With no assist  Social Interaction Social Interaction assist level: Interacts appropriately 90% of the time - Needs monitoring or encouragement for participation or interaction.  Problem Solving Problem solving assist level: Solves complex 90% of the time/cues < 10% of the time  Memory Memory assist level: Recognizes or recalls 90% of the time/requires cueing < 10% of the time   Medical Problem List and Plan: 1. 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  -dc home today.   -Patient to see me in the office for transitional care encounter in 1-2 weeks.  -follow up with NS, nephrologist as outpt,  2. DVT Prophylaxis/Anticoagulation: Pharmaceutical: Lovenox---dc today 3. Pain Management: pain improved today  -limit oxycodone due to effects on MS.  - Scheduled robaxin to help with muscle spasms and  low dose ultram 50mg  qid  -. K pad additionally for local measures.  4. Mood: Needs a lot of encouragement and ego support as is self limiting.   -anxiety a major issue. Added prn xanax.   -has improved.  5. Neuropsych: This patient is capable of making decisions on her own behalf. 6. Skin/Wound Care: Monitor wound daily for healing 7. Fluids/Electrolytes/Nutrition: Monitor I/O. 8. T2DM: Will monitor BS ac/hs. Continue lantus at bedtime with trajenta in am. Continue SSI for elevated BS.  9. CAD: Continue lopressor and lipitor.  10. Wheezing: denies increase in chronic SOB.  CXR without acute disease. 12. CKD with persistent BLE edema: improving  -po lasix  -Appreciate nephrology consult and recs.  Follow up with Mercy Moore as outpt  -Renal ultrasound reviewed on 5/25 suggesting medical renal disease 13. Abdominal pain: KUB negative. Continue to work on regular bowel schedule.  + bowel movements   -held colace and miralax 14. Urinary retention:ua neg, cx negative. Foley out.   -now voiding with low PVR's 15. Acute on chronic anemia due to CKD: Fe++ and ESA 16. H/o gout: off colchicine, due to renal fxn 17. Thrombocytopenia: Cont to monitor, improving 18. Hyponatremia: Cont to monitor 19. AFib: Monitor HR with increased activity 20. HTN: Bp's more elevated over last 2+ days.   Metoprolol increased to 100 on 5/29  -will add low dose prazosin today---needs follow up as outpt 21.  Right elbow  pain: Significantly improved  -hx of tennis elbow  Voltaren gel effective---continue  LOS (Days) 11 A FACE TO FACE EVALUATION WAS PERFORMED  [name] T 12/15/2015 9:10 AM

## 2015-12-16 DIAGNOSIS — Z6834 Body mass index (BMI) 34.0-34.9, adult: Secondary | ICD-10-CM | POA: Diagnosis not present

## 2015-12-16 DIAGNOSIS — K219 Gastro-esophageal reflux disease without esophagitis: Secondary | ICD-10-CM | POA: Diagnosis not present

## 2015-12-16 DIAGNOSIS — F329 Major depressive disorder, single episode, unspecified: Secondary | ICD-10-CM | POA: Diagnosis not present

## 2015-12-16 DIAGNOSIS — E1122 Type 2 diabetes mellitus with diabetic chronic kidney disease: Secondary | ICD-10-CM | POA: Diagnosis not present

## 2015-12-16 DIAGNOSIS — I252 Old myocardial infarction: Secondary | ICD-10-CM | POA: Diagnosis not present

## 2015-12-16 DIAGNOSIS — I2510 Atherosclerotic heart disease of native coronary artery without angina pectoris: Secondary | ICD-10-CM | POA: Diagnosis not present

## 2015-12-16 DIAGNOSIS — Z4789 Encounter for other orthopedic aftercare: Secondary | ICD-10-CM | POA: Diagnosis not present

## 2015-12-16 DIAGNOSIS — I120 Hypertensive chronic kidney disease with stage 5 chronic kidney disease or end stage renal disease: Secondary | ICD-10-CM | POA: Diagnosis not present

## 2015-12-16 DIAGNOSIS — Z794 Long term (current) use of insulin: Secondary | ICD-10-CM | POA: Diagnosis not present

## 2015-12-16 DIAGNOSIS — E669 Obesity, unspecified: Secondary | ICD-10-CM | POA: Diagnosis not present

## 2015-12-16 DIAGNOSIS — Z5181 Encounter for therapeutic drug level monitoring: Secondary | ICD-10-CM | POA: Diagnosis not present

## 2015-12-16 DIAGNOSIS — N185 Chronic kidney disease, stage 5: Secondary | ICD-10-CM | POA: Diagnosis not present

## 2015-12-18 DIAGNOSIS — E1122 Type 2 diabetes mellitus with diabetic chronic kidney disease: Secondary | ICD-10-CM | POA: Diagnosis not present

## 2015-12-18 DIAGNOSIS — F329 Major depressive disorder, single episode, unspecified: Secondary | ICD-10-CM | POA: Diagnosis not present

## 2015-12-18 DIAGNOSIS — I2510 Atherosclerotic heart disease of native coronary artery without angina pectoris: Secondary | ICD-10-CM | POA: Diagnosis not present

## 2015-12-18 DIAGNOSIS — N185 Chronic kidney disease, stage 5: Secondary | ICD-10-CM | POA: Diagnosis not present

## 2015-12-18 DIAGNOSIS — Z4789 Encounter for other orthopedic aftercare: Secondary | ICD-10-CM | POA: Diagnosis not present

## 2015-12-18 DIAGNOSIS — I120 Hypertensive chronic kidney disease with stage 5 chronic kidney disease or end stage renal disease: Secondary | ICD-10-CM | POA: Diagnosis not present

## 2015-12-19 ENCOUNTER — Telehealth: Payer: Self-pay

## 2015-12-19 NOTE — Telephone Encounter (Signed)
1. Are you/is patient experiencing any problems since coming home? Are there any questions regarding any aspect of care? Having issues with gout flare up. No appetite.  2. Are there any questions regarding medications administration/dosing? Are meds being taken as prescribed? Patient should review meds with caller to confirm. Meds have been confirmed.  3. Have there been any falls? No  4. Has Home Health been to the house and/or have they contacted you? If not, have you tried to contact them? Can we help you contact them? PT and OT have been to house.  5. Are bowels and bladder emptying properly? Are there any unexpected incontinence issues? If applicable, is patient following bowel/bladder programs? No issues.  6. Any fevers, problems with breathing, unexpected pain? No 7. Are there any skin problems or new areas of breakdown? No 8. Has the patient/family member arranged specialty MD follow up (ie cardiology/neurology/renal/surgical/etc)?  Can we help arrange? Made follow up appointments.  9. Does the patient need any other services or support that we can help arrange? No 10. Are caregivers following through as expected in assisting the patient? Yes, husband and daughter 46. Has the patient quit smoking, drinking alcohol, or using drugs as recommended? Pt is not smoking, drinking alcohol or using drugs.

## 2015-12-19 NOTE — Telephone Encounter (Signed)
Pt needs to be called this afternoon to verify if her appt on 12/27/15 @ 12:30 will be okay.

## 2015-12-19 NOTE — Telephone Encounter (Signed)
Spoke to patient's husband and she is not able to make the 12:30 appointment. [name] changed the appointment to 3:30 on the same day.

## 2015-12-20 ENCOUNTER — Inpatient Hospital Stay (HOSPITAL_COMMUNITY)
Admission: EM | Admit: 2015-12-20 | Discharge: 2015-12-25 | DRG: 300 | Disposition: A | Discharge status: Home Health | Payer: Medicare Other | Attending: Internal Medicine | Admitting: Internal Medicine

## 2015-12-20 DIAGNOSIS — B965 Pseudomonas (aeruginosa) (mallei) (pseudomallei) as the cause of diseases classified elsewhere: Secondary | ICD-10-CM | POA: Diagnosis present

## 2015-12-20 DIAGNOSIS — E1169 Type 2 diabetes mellitus with other specified complication: Secondary | ICD-10-CM | POA: Diagnosis present

## 2015-12-20 DIAGNOSIS — M79606 Pain in leg, unspecified: Secondary | ICD-10-CM

## 2015-12-20 DIAGNOSIS — E785 Hyperlipidemia, unspecified: Secondary | ICD-10-CM | POA: Diagnosis present

## 2015-12-20 DIAGNOSIS — S299XXA Unspecified injury of thorax, initial encounter: Secondary | ICD-10-CM | POA: Diagnosis not present

## 2015-12-20 DIAGNOSIS — M545 Low back pain, unspecified: Secondary | ICD-10-CM | POA: Diagnosis present

## 2015-12-20 DIAGNOSIS — H409 Unspecified glaucoma: Secondary | ICD-10-CM | POA: Diagnosis present

## 2015-12-20 DIAGNOSIS — Z833 Family history of diabetes mellitus: Secondary | ICD-10-CM

## 2015-12-20 DIAGNOSIS — I252 Old myocardial infarction: Secondary | ICD-10-CM

## 2015-12-20 DIAGNOSIS — Z791 Long term (current) use of non-steroidal anti-inflammatories (NSAID): Secondary | ICD-10-CM

## 2015-12-20 DIAGNOSIS — Z7982 Long term (current) use of aspirin: Secondary | ICD-10-CM

## 2015-12-20 DIAGNOSIS — S3992XA Unspecified injury of lower back, initial encounter: Secondary | ICD-10-CM | POA: Diagnosis not present

## 2015-12-20 DIAGNOSIS — E1151 Type 2 diabetes mellitus with diabetic peripheral angiopathy without gangrene: Secondary | ICD-10-CM | POA: Diagnosis present

## 2015-12-20 DIAGNOSIS — N185 Chronic kidney disease, stage 5: Secondary | ICD-10-CM | POA: Diagnosis present

## 2015-12-20 DIAGNOSIS — E1122 Type 2 diabetes mellitus with diabetic chronic kidney disease: Secondary | ICD-10-CM | POA: Diagnosis not present

## 2015-12-20 DIAGNOSIS — N189 Chronic kidney disease, unspecified: Secondary | ICD-10-CM

## 2015-12-20 DIAGNOSIS — IMO0001 Reserved for inherently not codable concepts without codable children: Secondary | ICD-10-CM

## 2015-12-20 DIAGNOSIS — Z8739 Personal history of other diseases of the musculoskeletal system and connective tissue: Secondary | ICD-10-CM

## 2015-12-20 DIAGNOSIS — R531 Weakness: Secondary | ICD-10-CM

## 2015-12-20 DIAGNOSIS — D631 Anemia in chronic kidney disease: Secondary | ICD-10-CM | POA: Diagnosis present

## 2015-12-20 DIAGNOSIS — H9190 Unspecified hearing loss, unspecified ear: Secondary | ICD-10-CM | POA: Diagnosis present

## 2015-12-20 DIAGNOSIS — R29898 Other symptoms and signs involving the musculoskeletal system: Secondary | ICD-10-CM

## 2015-12-20 DIAGNOSIS — R52 Pain, unspecified: Secondary | ICD-10-CM

## 2015-12-20 DIAGNOSIS — Z8673 Personal history of transient ischemic attack (TIA), and cerebral infarction without residual deficits: Secondary | ICD-10-CM

## 2015-12-20 DIAGNOSIS — N390 Urinary tract infection, site not specified: Secondary | ICD-10-CM | POA: Diagnosis present

## 2015-12-20 DIAGNOSIS — I480 Paroxysmal atrial fibrillation: Secondary | ICD-10-CM | POA: Diagnosis present

## 2015-12-20 DIAGNOSIS — Z794 Long term (current) use of insulin: Secondary | ICD-10-CM

## 2015-12-20 DIAGNOSIS — I120 Hypertensive chronic kidney disease with stage 5 chronic kidney disease or end stage renal disease: Secondary | ICD-10-CM | POA: Diagnosis present

## 2015-12-20 DIAGNOSIS — S199XXA Unspecified injury of neck, initial encounter: Secondary | ICD-10-CM | POA: Diagnosis not present

## 2015-12-20 DIAGNOSIS — E869 Volume depletion, unspecified: Secondary | ICD-10-CM | POA: Diagnosis present

## 2015-12-20 DIAGNOSIS — M546 Pain in thoracic spine: Secondary | ICD-10-CM | POA: Diagnosis not present

## 2015-12-20 DIAGNOSIS — G253 Myoclonus: Secondary | ICD-10-CM | POA: Diagnosis present

## 2015-12-20 DIAGNOSIS — M109 Gout, unspecified: Secondary | ICD-10-CM | POA: Diagnosis present

## 2015-12-20 DIAGNOSIS — M6281 Muscle weakness (generalized): Secondary | ICD-10-CM | POA: Diagnosis not present

## 2015-12-20 DIAGNOSIS — E876 Hypokalemia: Secondary | ICD-10-CM | POA: Diagnosis present

## 2015-12-20 DIAGNOSIS — M11262 Other chondrocalcinosis, left knee: Secondary | ICD-10-CM | POA: Diagnosis present

## 2015-12-20 DIAGNOSIS — Z888 Allergy status to other drugs, medicaments and biological substances status: Secondary | ICD-10-CM

## 2015-12-20 DIAGNOSIS — I70213 Atherosclerosis of native arteries of extremities with intermittent claudication, bilateral legs: Secondary | ICD-10-CM | POA: Diagnosis not present

## 2015-12-20 DIAGNOSIS — Z79899 Other long term (current) drug therapy: Secondary | ICD-10-CM

## 2015-12-20 DIAGNOSIS — I10 Essential (primary) hypertension: Secondary | ICD-10-CM | POA: Diagnosis present

## 2015-12-20 DIAGNOSIS — I482 Chronic atrial fibrillation: Secondary | ICD-10-CM | POA: Diagnosis not present

## 2015-12-20 DIAGNOSIS — Z951 Presence of aortocoronary bypass graft: Secondary | ICD-10-CM

## 2015-12-20 DIAGNOSIS — Z823 Family history of stroke: Secondary | ICD-10-CM

## 2015-12-20 DIAGNOSIS — M5416 Radiculopathy, lumbar region: Secondary | ICD-10-CM | POA: Diagnosis present

## 2015-12-20 DIAGNOSIS — K219 Gastro-esophageal reflux disease without esophagitis: Secondary | ICD-10-CM | POA: Diagnosis present

## 2015-12-20 DIAGNOSIS — E119 Type 2 diabetes mellitus without complications: Secondary | ICD-10-CM

## 2015-12-20 DIAGNOSIS — Z882 Allergy status to sulfonamides status: Secondary | ICD-10-CM

## 2015-12-20 DIAGNOSIS — Z8249 Family history of ischemic heart disease and other diseases of the circulatory system: Secondary | ICD-10-CM

## 2015-12-20 DIAGNOSIS — M5489 Other dorsalgia: Secondary | ICD-10-CM | POA: Diagnosis not present

## 2015-12-20 DIAGNOSIS — I2510 Atherosclerotic heart disease of native coronary artery without angina pectoris: Secondary | ICD-10-CM | POA: Diagnosis present

## 2015-12-20 DIAGNOSIS — I4891 Unspecified atrial fibrillation: Secondary | ICD-10-CM | POA: Diagnosis present

## 2015-12-20 DIAGNOSIS — S0990XA Unspecified injury of head, initial encounter: Secondary | ICD-10-CM | POA: Diagnosis not present

## 2015-12-20 DIAGNOSIS — Z9889 Other specified postprocedural states: Secondary | ICD-10-CM

## 2015-12-20 DIAGNOSIS — F329 Major depressive disorder, single episode, unspecified: Secondary | ICD-10-CM | POA: Diagnosis present

## 2015-12-20 HISTORY — DX: Hypokalemia: E87.6

## 2015-12-20 HISTORY — DX: Weakness: R53.1

## 2015-12-21 ENCOUNTER — Inpatient Hospital Stay (HOSPITAL_COMMUNITY): Payer: Medicare Other

## 2015-12-21 ENCOUNTER — Encounter (HOSPITAL_COMMUNITY): Payer: Self-pay | Admitting: Emergency Medicine

## 2015-12-21 ENCOUNTER — Emergency Department (HOSPITAL_COMMUNITY): Payer: Medicare Other

## 2015-12-21 DIAGNOSIS — Z794 Long term (current) use of insulin: Secondary | ICD-10-CM | POA: Diagnosis not present

## 2015-12-21 DIAGNOSIS — S99912A Unspecified injury of left ankle, initial encounter: Secondary | ICD-10-CM | POA: Diagnosis not present

## 2015-12-21 DIAGNOSIS — M545 Low back pain, unspecified: Secondary | ICD-10-CM | POA: Diagnosis present

## 2015-12-21 DIAGNOSIS — Z8249 Family history of ischemic heart disease and other diseases of the circulatory system: Secondary | ICD-10-CM | POA: Diagnosis not present

## 2015-12-21 DIAGNOSIS — Z79899 Other long term (current) drug therapy: Secondary | ICD-10-CM | POA: Diagnosis not present

## 2015-12-21 DIAGNOSIS — R531 Weakness: Secondary | ICD-10-CM

## 2015-12-21 DIAGNOSIS — I482 Chronic atrial fibrillation: Secondary | ICD-10-CM | POA: Diagnosis present

## 2015-12-21 DIAGNOSIS — S199XXA Unspecified injury of neck, initial encounter: Secondary | ICD-10-CM | POA: Diagnosis not present

## 2015-12-21 DIAGNOSIS — Z888 Allergy status to other drugs, medicaments and biological substances status: Secondary | ICD-10-CM | POA: Diagnosis not present

## 2015-12-21 DIAGNOSIS — I2510 Atherosclerotic heart disease of native coronary artery without angina pectoris: Secondary | ICD-10-CM | POA: Diagnosis present

## 2015-12-21 DIAGNOSIS — G250 Essential tremor: Secondary | ICD-10-CM | POA: Diagnosis not present

## 2015-12-21 DIAGNOSIS — S0990XA Unspecified injury of head, initial encounter: Secondary | ICD-10-CM | POA: Diagnosis not present

## 2015-12-21 DIAGNOSIS — E1122 Type 2 diabetes mellitus with diabetic chronic kidney disease: Secondary | ICD-10-CM | POA: Diagnosis present

## 2015-12-21 DIAGNOSIS — E869 Volume depletion, unspecified: Secondary | ICD-10-CM | POA: Diagnosis present

## 2015-12-21 DIAGNOSIS — E876 Hypokalemia: Secondary | ICD-10-CM | POA: Diagnosis not present

## 2015-12-21 DIAGNOSIS — K219 Gastro-esophageal reflux disease without esophagitis: Secondary | ICD-10-CM | POA: Diagnosis present

## 2015-12-21 DIAGNOSIS — G253 Myoclonus: Secondary | ICD-10-CM | POA: Diagnosis not present

## 2015-12-21 DIAGNOSIS — I480 Paroxysmal atrial fibrillation: Secondary | ICD-10-CM

## 2015-12-21 DIAGNOSIS — I120 Hypertensive chronic kidney disease with stage 5 chronic kidney disease or end stage renal disease: Secondary | ICD-10-CM | POA: Diagnosis present

## 2015-12-21 DIAGNOSIS — Z791 Long term (current) use of non-steroidal anti-inflammatories (NSAID): Secondary | ICD-10-CM | POA: Diagnosis not present

## 2015-12-21 DIAGNOSIS — M11262 Other chondrocalcinosis, left knee: Secondary | ICD-10-CM | POA: Diagnosis present

## 2015-12-21 DIAGNOSIS — N184 Chronic kidney disease, stage 4 (severe): Secondary | ICD-10-CM | POA: Diagnosis not present

## 2015-12-21 DIAGNOSIS — H409 Unspecified glaucoma: Secondary | ICD-10-CM | POA: Diagnosis present

## 2015-12-21 DIAGNOSIS — I252 Old myocardial infarction: Secondary | ICD-10-CM | POA: Diagnosis not present

## 2015-12-21 DIAGNOSIS — Z951 Presence of aortocoronary bypass graft: Secondary | ICD-10-CM | POA: Diagnosis not present

## 2015-12-21 DIAGNOSIS — N390 Urinary tract infection, site not specified: Secondary | ICD-10-CM | POA: Diagnosis present

## 2015-12-21 DIAGNOSIS — Z8673 Personal history of transient ischemic attack (TIA), and cerebral infarction without residual deficits: Secondary | ICD-10-CM | POA: Diagnosis not present

## 2015-12-21 DIAGNOSIS — Z833 Family history of diabetes mellitus: Secondary | ICD-10-CM | POA: Diagnosis not present

## 2015-12-21 DIAGNOSIS — M79605 Pain in left leg: Secondary | ICD-10-CM | POA: Diagnosis not present

## 2015-12-21 DIAGNOSIS — I70213 Atherosclerosis of native arteries of extremities with intermittent claudication, bilateral legs: Secondary | ICD-10-CM | POA: Diagnosis present

## 2015-12-21 DIAGNOSIS — E119 Type 2 diabetes mellitus without complications: Secondary | ICD-10-CM

## 2015-12-21 DIAGNOSIS — E785 Hyperlipidemia, unspecified: Secondary | ICD-10-CM | POA: Diagnosis present

## 2015-12-21 DIAGNOSIS — S3992XA Unspecified injury of lower back, initial encounter: Secondary | ICD-10-CM | POA: Diagnosis not present

## 2015-12-21 DIAGNOSIS — B965 Pseudomonas (aeruginosa) (mallei) (pseudomallei) as the cause of diseases classified elsewhere: Secondary | ICD-10-CM | POA: Diagnosis present

## 2015-12-21 DIAGNOSIS — S8992XA Unspecified injury of left lower leg, initial encounter: Secondary | ICD-10-CM | POA: Diagnosis not present

## 2015-12-21 DIAGNOSIS — Z7982 Long term (current) use of aspirin: Secondary | ICD-10-CM | POA: Diagnosis not present

## 2015-12-21 DIAGNOSIS — M109 Gout, unspecified: Secondary | ICD-10-CM | POA: Diagnosis present

## 2015-12-21 DIAGNOSIS — F329 Major depressive disorder, single episode, unspecified: Secondary | ICD-10-CM | POA: Diagnosis present

## 2015-12-21 DIAGNOSIS — M7989 Other specified soft tissue disorders: Secondary | ICD-10-CM | POA: Diagnosis not present

## 2015-12-21 DIAGNOSIS — N185 Chronic kidney disease, stage 5: Secondary | ICD-10-CM

## 2015-12-21 DIAGNOSIS — H9190 Unspecified hearing loss, unspecified ear: Secondary | ICD-10-CM | POA: Diagnosis present

## 2015-12-21 DIAGNOSIS — S99922A Unspecified injury of left foot, initial encounter: Secondary | ICD-10-CM | POA: Diagnosis not present

## 2015-12-21 DIAGNOSIS — Z823 Family history of stroke: Secondary | ICD-10-CM | POA: Diagnosis not present

## 2015-12-21 DIAGNOSIS — E1151 Type 2 diabetes mellitus with diabetic peripheral angiopathy without gangrene: Secondary | ICD-10-CM | POA: Diagnosis present

## 2015-12-21 DIAGNOSIS — S299XXA Unspecified injury of thorax, initial encounter: Secondary | ICD-10-CM | POA: Diagnosis not present

## 2015-12-21 DIAGNOSIS — G4752 REM sleep behavior disorder: Secondary | ICD-10-CM | POA: Diagnosis not present

## 2015-12-21 DIAGNOSIS — M546 Pain in thoracic spine: Secondary | ICD-10-CM | POA: Diagnosis not present

## 2015-12-21 DIAGNOSIS — R29898 Other symptoms and signs involving the musculoskeletal system: Secondary | ICD-10-CM | POA: Diagnosis not present

## 2015-12-21 DIAGNOSIS — M79609 Pain in unspecified limb: Secondary | ICD-10-CM | POA: Diagnosis not present

## 2015-12-21 DIAGNOSIS — D631 Anemia in chronic kidney disease: Secondary | ICD-10-CM | POA: Diagnosis not present

## 2015-12-21 DIAGNOSIS — Z882 Allergy status to sulfonamides status: Secondary | ICD-10-CM | POA: Diagnosis not present

## 2015-12-21 LAB — URINALYSIS, ROUTINE W REFLEX MICROSCOPIC
BILIRUBIN URINE: NEGATIVE
GLUCOSE, UA: NEGATIVE mg/dL
KETONES UR: NEGATIVE mg/dL
NITRITE: POSITIVE — AB
PH: 6.5 (ref 5.0–8.0)
Protein, ur: >300 mg/dL — AB
Specific Gravity, Urine: 1.013 (ref 1.005–1.030)

## 2015-12-21 LAB — BASIC METABOLIC PANEL WITH GFR
Anion gap: 10 (ref 5–15)
BUN: 40 mg/dL — ABNORMAL HIGH (ref 6–20)
CO2: 27 mmol/L (ref 22–32)
Calcium: 8.6 mg/dL — ABNORMAL LOW (ref 8.9–10.3)
Chloride: 105 mmol/L (ref 101–111)
Creatinine, Ser: 2.15 mg/dL — ABNORMAL HIGH (ref 0.44–1.00)
GFR calc Af Amer: 24 mL/min — ABNORMAL LOW
GFR calc non Af Amer: 21 mL/min — ABNORMAL LOW
Glucose, Bld: 125 mg/dL — ABNORMAL HIGH (ref 65–99)
Potassium: 3 mmol/L — ABNORMAL LOW (ref 3.5–5.1)
Sodium: 142 mmol/L (ref 135–145)

## 2015-12-21 LAB — CBG MONITORING, ED: Glucose-Capillary: 119 mg/dL — ABNORMAL HIGH (ref 65–99)

## 2015-12-21 LAB — CBC WITH DIFFERENTIAL/PLATELET
Basophils Absolute: 0 K/uL (ref 0.0–0.1)
Basophils Relative: 0 %
Eosinophils Absolute: 0.1 K/uL (ref 0.0–0.7)
Eosinophils Relative: 1 %
HCT: 35 % — ABNORMAL LOW (ref 36.0–46.0)
Hemoglobin: 10.9 g/dL — ABNORMAL LOW (ref 12.0–15.0)
Lymphocytes Relative: 19 %
Lymphs Abs: 1.7 K/uL (ref 0.7–4.0)
MCH: 30.4 pg (ref 26.0–34.0)
MCHC: 31.1 g/dL (ref 30.0–36.0)
MCV: 97.8 fL (ref 78.0–100.0)
Monocytes Absolute: 0.7 K/uL (ref 0.1–1.0)
Monocytes Relative: 8 %
Neutro Abs: 6.3 K/uL (ref 1.7–7.7)
Neutrophils Relative %: 72 %
Platelets: 189 K/uL (ref 150–400)
RBC: 3.58 MIL/uL — ABNORMAL LOW (ref 3.87–5.11)
RDW: 15.5 % (ref 11.5–15.5)
WBC: 8.8 K/uL (ref 4.0–10.5)

## 2015-12-21 LAB — C-REACTIVE PROTEIN

## 2015-12-21 LAB — BASIC METABOLIC PANEL
Anion gap: 10 (ref 5–15)
BUN: 40 mg/dL — AB (ref 6–20)
CALCIUM: 8.5 mg/dL — AB (ref 8.9–10.3)
CHLORIDE: 107 mmol/L (ref 101–111)
CO2: 24 mmol/L (ref 22–32)
CREATININE: 2.14 mg/dL — AB (ref 0.44–1.00)
GFR calc Af Amer: 25 mL/min — ABNORMAL LOW (ref >60)
GFR calc non Af Amer: 21 mL/min — ABNORMAL LOW (ref >60)
GLUCOSE: 106 mg/dL — AB (ref 65–99)
Potassium: 5.1 mmol/L (ref 3.5–5.1)
Sodium: 141 mmol/L (ref 135–145)

## 2015-12-21 LAB — PROCALCITONIN: PROCALCITONIN: 0.49 ng/mL

## 2015-12-21 LAB — URINE MICROSCOPIC-ADD ON

## 2015-12-21 LAB — MAGNESIUM: MAGNESIUM: 1.3 mg/dL — AB (ref 1.7–2.4)

## 2015-12-21 LAB — GLUCOSE, CAPILLARY
GLUCOSE-CAPILLARY: 147 mg/dL — AB (ref 65–99)
Glucose-Capillary: 96 mg/dL (ref 65–99)

## 2015-12-21 LAB — SEDIMENTATION RATE: Sed Rate: 30 mm/hr — ABNORMAL HIGH (ref 0–22)

## 2015-12-21 LAB — URIC ACID: URIC ACID, SERUM: 7.2 mg/dL — AB (ref 2.3–6.6)

## 2015-12-21 LAB — PHOSPHORUS: Phosphorus: 3.3 mg/dL (ref 2.5–4.6)

## 2015-12-21 LAB — BRAIN NATRIURETIC PEPTIDE: B NATRIURETIC PEPTIDE 5: 796.6 pg/mL — AB (ref 0.0–100.0)

## 2015-12-21 MED ORDER — ASPIRIN 325 MG PO TABS
325.0000 mg | ORAL_TABLET | Freq: Every day | ORAL | Status: DC
Start: 2015-12-21 — End: 2015-12-25
  Administered 2015-12-21 – 2015-12-25 (×5): 325 mg via ORAL
  Filled 2015-12-21 (×5): qty 1

## 2015-12-21 MED ORDER — METOPROLOL TARTRATE 25 MG PO TABS
100.0000 mg | ORAL_TABLET | Freq: Two times a day (BID) | ORAL | Status: DC
  Administered 2015-12-21 – 2015-12-25 (×8): 100 mg via ORAL
  Filled 2015-12-21 (×8): qty 4

## 2015-12-21 MED ORDER — OXYCODONE HCL 5 MG PO TABS
5.0000 mg | ORAL_TABLET | ORAL | Status: DC | PRN
  Administered 2015-12-21 – 2015-12-22 (×2): 5 mg via ORAL
  Filled 2015-12-21 (×2): qty 1

## 2015-12-21 MED ORDER — POTASSIUM CHLORIDE 10 MEQ/100ML IV SOLN
10.0000 meq | INTRAVENOUS | Status: AC
  Administered 2015-12-21: 10 meq via INTRAVENOUS
  Filled 2015-12-21 (×2): qty 100

## 2015-12-21 MED ORDER — ACETAMINOPHEN 650 MG RE SUPP: 650.0000 mg | Freq: Four times a day (QID) | RECTAL | Status: DC | PRN

## 2015-12-21 MED ORDER — MUSCLE RUB 10-15 % EX CREA
1.0000 "application " | TOPICAL_CREAM | Freq: Two times a day (BID) | CUTANEOUS | Status: DC
  Administered 2015-12-21 – 2015-12-24 (×5): 1 via TOPICAL
  Filled 2015-12-21: qty 85

## 2015-12-21 MED ORDER — DICLOFENAC SODIUM 1 % TD GEL
2.0000 g | Freq: Four times a day (QID) | TRANSDERMAL | Status: DC
  Administered 2015-12-21 – 2015-12-24 (×11): 2 g via TOPICAL
  Filled 2015-12-21: qty 100

## 2015-12-21 MED ORDER — ALLOPURINOL 100 MG PO TABS
100.0000 mg | ORAL_TABLET | Freq: Two times a day (BID) | ORAL | Status: DC
  Administered 2015-12-21 – 2015-12-25 (×8): 100 mg via ORAL
  Filled 2015-12-21 (×8): qty 1

## 2015-12-21 MED ORDER — ACETAMINOPHEN 325 MG PO TABS
650.0000 mg | ORAL_TABLET | Freq: Four times a day (QID) | ORAL | Status: DC | PRN
  Administered 2015-12-22 – 2015-12-25 (×5): 650 mg via ORAL
  Filled 2015-12-21 (×5): qty 2

## 2015-12-21 MED ORDER — ACETAMINOPHEN 500 MG PO TABS
1000.0000 mg | ORAL_TABLET | Freq: Once | ORAL | Status: AC
  Administered 2015-12-21: 1000 mg via ORAL
  Filled 2015-12-21: qty 2

## 2015-12-21 MED ORDER — ATORVASTATIN CALCIUM 10 MG PO TABS
10.0000 mg | ORAL_TABLET | Freq: Every day | ORAL | Status: DC
  Administered 2015-12-21 – 2015-12-24 (×4): 10 mg via ORAL
  Filled 2015-12-21 (×4): qty 1

## 2015-12-21 MED ORDER — TRAVOPROST (BAK FREE) 0.004 % OP SOLN
1.0000 [drp] | Freq: Every day | OPHTHALMIC | Status: DC
  Administered 2015-12-21 – 2015-12-24 (×4): 1 [drp] via OPHTHALMIC
  Filled 2015-12-21: qty 2.5

## 2015-12-21 MED ORDER — OMEGA-3-ACID ETHYL ESTERS 1 G PO CAPS
2.0000 g | ORAL_CAPSULE | Freq: Every day | ORAL | Status: DC
  Administered 2015-12-21 – 2015-12-25 (×5): 2 g via ORAL
  Filled 2015-12-21 (×4): qty 2

## 2015-12-21 MED ORDER — PANTOPRAZOLE SODIUM 40 MG PO TBEC
40.0000 mg | DELAYED_RELEASE_TABLET | Freq: Every day | ORAL | Status: DC
  Administered 2015-12-21 – 2015-12-25 (×5): 40 mg via ORAL
  Filled 2015-12-21 (×5): qty 1

## 2015-12-21 MED ORDER — DEXTROSE 5 % IV SOLN
1.0000 g | INTRAVENOUS | Status: DC
  Administered 2015-12-21 – 2015-12-23 (×3): 1 g via INTRAVENOUS
  Filled 2015-12-21 (×4): qty 10

## 2015-12-21 MED ORDER — TIMOLOL MALEATE 0.5 % OP SOLN
1.0000 [drp] | Freq: Every day | OPHTHALMIC | Status: DC
Start: 2015-12-21 — End: 2015-12-25
  Administered 2015-12-21 – 2015-12-25 (×5): 1 [drp] via OPHTHALMIC
  Filled 2015-12-21: qty 5

## 2015-12-21 MED ORDER — VITAMIN B 12 100 MCG PO LOZG: 1000.0000 mg | LOZENGE | Freq: Every morning | ORAL | Status: DC

## 2015-12-21 MED ORDER — FUROSEMIDE 20 MG PO TABS
80.0000 mg | ORAL_TABLET | Freq: Every day | ORAL | Status: DC
  Administered 2015-12-22 – 2015-12-25 (×4): 80 mg via ORAL
  Filled 2015-12-21 (×4): qty 4

## 2015-12-21 MED ORDER — INSULIN ASPART 100 UNIT/ML ~~LOC~~ SOLN
0.0000 [IU] | Freq: Three times a day (TID) | SUBCUTANEOUS | Status: DC
  Administered 2015-12-22 – 2015-12-23 (×2): 2 [IU] via SUBCUTANEOUS
  Administered 2015-12-23: 1 [IU] via SUBCUTANEOUS
  Administered 2015-12-23 – 2015-12-24 (×2): 2 [IU] via SUBCUTANEOUS
  Administered 2015-12-24 – 2015-12-25 (×3): 1 [IU] via SUBCUTANEOUS

## 2015-12-21 MED ORDER — INSULIN GLARGINE 100 UNIT/ML ~~LOC~~ SOLN
20.0000 [IU] | Freq: Every day | SUBCUTANEOUS | Status: DC
  Administered 2015-12-21 – 2015-12-24 (×4): 20 [IU] via SUBCUTANEOUS
  Filled 2015-12-21 (×5): qty 0.2

## 2015-12-21 MED ORDER — CALCITRIOL 0.25 MCG PO CAPS
0.2500 ug | ORAL_CAPSULE | ORAL | Status: DC
  Administered 2015-12-21 – 2015-12-25 (×3): 0.25 ug via ORAL
  Filled 2015-12-21 (×4): qty 1

## 2015-12-21 MED ORDER — METOPROLOL TARTRATE 25 MG PO TABS
100.0000 mg | ORAL_TABLET | Freq: Once | ORAL | Status: AC
  Administered 2015-12-21: 100 mg via ORAL
  Filled 2015-12-21: qty 4

## 2015-12-21 MED ORDER — CITALOPRAM HYDROBROMIDE 20 MG PO TABS
20.0000 mg | ORAL_TABLET | Freq: Every day | ORAL | Status: DC
  Administered 2015-12-21 – 2015-12-25 (×5): 20 mg via ORAL
  Filled 2015-12-21 (×5): qty 1

## 2015-12-21 MED ORDER — ISOSORBIDE MONONITRATE ER 30 MG PO TB24
30.0000 mg | ORAL_TABLET | Freq: Every day | ORAL | Status: DC
  Administered 2015-12-22 – 2015-12-25 (×4): 30 mg via ORAL
  Filled 2015-12-21 (×4): qty 1

## 2015-12-21 MED ORDER — INSULIN ASPART 100 UNIT/ML ~~LOC~~ SOLN: 0.0000 [IU] | Freq: Every day | SUBCUTANEOUS | Status: DC

## 2015-12-21 MED ORDER — POTASSIUM CHLORIDE CRYS ER 20 MEQ PO TBCR
40.0000 meq | EXTENDED_RELEASE_TABLET | Freq: Once | ORAL | Status: AC
  Administered 2015-12-21: 40 meq via ORAL
  Filled 2015-12-21: qty 2

## 2015-12-21 MED ORDER — VITAMIN B-12 1000 MCG PO TABS
1000.0000 ug | ORAL_TABLET | Freq: Every day | ORAL | Status: DC
  Administered 2015-12-23 – 2015-12-25 (×3): 1000 ug via ORAL
  Filled 2015-12-21 (×4): qty 1

## 2015-12-21 MED ORDER — BRIMONIDINE TARTRATE 0.15 % OP SOLN
1.0000 [drp] | Freq: Two times a day (BID) | OPHTHALMIC | Status: DC
  Administered 2015-12-21 – 2015-12-25 (×8): 1 [drp] via OPHTHALMIC
  Filled 2015-12-21: qty 5

## 2015-12-21 MED ORDER — POTASSIUM CHLORIDE 10 MEQ/100ML IV SOLN
10.0000 meq | Freq: Once | INTRAVENOUS | Status: AC
  Administered 2015-12-21: 10 meq via INTRAVENOUS

## 2015-12-21 MED ORDER — OXYCODONE-ACETAMINOPHEN 5-325 MG PO TABS
1.0000 | ORAL_TABLET | ORAL | Status: AC
  Administered 2015-12-21: 1 via ORAL
  Filled 2015-12-21: qty 1

## 2015-12-21 MED ORDER — FUROSEMIDE 20 MG PO TABS
80.0000 mg | ORAL_TABLET | Freq: Once | ORAL | Status: AC
  Administered 2015-12-21: 80 mg via ORAL
  Filled 2015-12-21: qty 4

## 2015-12-21 MED ORDER — PRAZOSIN HCL 1 MG PO CAPS
1.0000 mg | ORAL_CAPSULE | Freq: Every day | ORAL | Status: DC
  Administered 2015-12-22 – 2015-12-24 (×3): 1 mg via ORAL
  Filled 2015-12-21 (×3): qty 1

## 2015-12-21 MED ORDER — VITAMIN D 1000 UNITS PO TABS
2000.0000 [IU] | ORAL_TABLET | Freq: Two times a day (BID) | ORAL | Status: DC
Start: 2015-12-21 — End: 2015-12-25
  Administered 2015-12-21 – 2015-12-25 (×8): 2000 [IU] via ORAL
  Filled 2015-12-21 (×9): qty 2

## 2015-12-21 MED ORDER — SODIUM CHLORIDE 0.9% FLUSH
3.0000 mL | Freq: Two times a day (BID) | INTRAVENOUS | Status: DC
  Administered 2015-12-21 – 2015-12-24 (×6): 3 mL via INTRAVENOUS

## 2015-12-21 MED ORDER — OMEGA-3 FATTY ACIDS 1000 MG PO CAPS: 2.0000 g | ORAL_CAPSULE | Freq: Every day | ORAL | Status: DC

## 2015-12-21 MED ORDER — PRAZOSIN HCL 1 MG PO CAPS
1.0000 mg | ORAL_CAPSULE | Freq: Once | ORAL | Status: AC
  Administered 2015-12-21: 1 mg via ORAL
  Filled 2015-12-21: qty 1

## 2015-12-21 MED ORDER — LORAZEPAM 1 MG PO TABS
1.0000 mg | ORAL_TABLET | Freq: Once | ORAL | Status: AC
  Administered 2015-12-21: 1 mg via ORAL
  Filled 2015-12-21: qty 1

## 2015-12-21 MED ORDER — MORPHINE SULFATE (PF) 2 MG/ML IV SOLN: 1.0000 mg | INTRAVENOUS | Status: DC | PRN

## 2015-12-21 MED ORDER — LINAGLIPTIN 5 MG PO TABS
5.0000 mg | ORAL_TABLET | Freq: Every day | ORAL | Status: DC
  Administered 2015-12-22 – 2015-12-25 (×4): 5 mg via ORAL
  Filled 2015-12-21 (×4): qty 1

## 2015-12-21 MED ORDER — HEPARIN SODIUM (PORCINE) 5000 UNIT/ML IJ SOLN
5000.0000 [IU] | Freq: Three times a day (TID) | INTRAMUSCULAR | Status: DC
  Administered 2015-12-21 – 2015-12-25 (×11): 5000 [IU] via SUBCUTANEOUS
  Filled 2015-12-21 (×11): qty 1

## 2015-12-21 NOTE — ED Notes (Signed)
The nurse on 5a had me page admitting MD to ask about pt's bp. Paged MD. Asked for BP med. No new orders at this time.

## 2015-12-21 NOTE — ED Provider Notes (Signed)
CSN: [id number]     Arrival date & time 12/20/15  2359 History  By signing my name below, I, Emmanuella Mensah, attest that this documentation has been prepared under the direction and in the presence of Sharlett Iles, MD. Electronically Signed: Judithann Sauger, ED Scribe. 12/21/2015. 12:30 AM.    Chief Complaint  Patient presents with  . Back Pain   The history is provided by the patient. No language interpreter was used.   HPI Comments: [name] is a 75 y.o. female with a hx of HTN, CAD, and DM who presents to the Emergency Department complaining of sudden onset of gradually worsening irritated back pain s/p fall that occurred PTA. She explains that her legs began to feel weak and like "jelly" as she was ambulating to the bathroom but her granddaughter caught her and helped her down slowly to the floor. She did not actually fall. She denies any LOC or head injuries. She also denies any other injuries during the fall. Pt is currently on daily aspirin. No n/v or bowel/bladder incontinence.   Pt had back surgery on Dec 01, 2015 and was discharged on December 15, 2015. Pt had a follow up appointment with her surgeon today and was informed that her incisions were healing well and to return in 5 weeks.    Past Medical History  Diagnosis Date  . Anemia   . Glaucoma   . Hypertension   . Blood transfusion   . GERD (gastroesophageal reflux disease)   . Arthritis   . Neuromuscular disorder ([place])     CARPEL TUNNEL  . Atrial fibrillation ([place])   . CAD (coronary artery disease)   . Carotid artery occlusion     Carotid Endartectom,y - left 2009.  Blockage Right being watched by Dr Scot Dock.  Marland Kitchen HOH (hard of hearing)   . Shortness of breath   . Depression   . History of kidney stones     passed  . Carotid stenosis   . Complication of anesthesia     pt. states that she was difficult to wake  . Stroke ([place])     hx of TIA  . 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 Cancer  ([place])     .  top of head- melonoma  . Dysrhythmia   . History of pneumonia   . Diabetes mellitus without complication ([place])   . Myocardial infarction ([place])   . History of hiatal hernia   . Pneumonia    Past Surgical History  Procedure Laterality Date  . Carpel tunnel    . Neck surgery    . Esophagogastroduodenoscopy  07/25/2011    Procedure: ESOPHAGOGASTRODUODENOSCOPY (EGD);  Surgeon: Winfield Cunas., MD;  Location: [facility] ENDOSCOPY;  Service: Endoscopy;  Laterality: N/A;  . Colonoscopy  07/25/2011    Procedure: COLONOSCOPY;  Surgeon: Winfield Cunas., MD;  Location: [facility] ENDOSCOPY;  Service: Endoscopy;  Laterality: N/A;  . Coronary artery bypass graft  07/29/2011    Procedure: CORONARY ARTERY BYPASS GRAFTING (CABG);  Surgeon: Gaye Pollack, MD;  Location: [place];  Service: Open Heart Surgery;  Laterality: N/A;  . Carotid endarterectomy Left 2009     CEA  . Av fistula placement Left 05/31/2014    Procedure: Creation of Left Arm arteriovenous brachiocephalic Fistula;  Surgeon: Angelia Mould, MD;  Location: [place];  Service: Vascular;  Laterality: Left;  . Fistulogram Left 08/29/2014    Procedure: FISTULOGRAM;  Surgeon:  Angelia Mould, MD;  Location: [facility] CATH LAB;  Service: Cardiovascular;  Laterality: Left;  . Av fistula placement Left 09/01/2014    Procedure: INSERTION OF ARTERIOVENOUS (AV) GORE-TEX GRAFT LEFT UPPER ARM USING  4-7 MM X 45 CM SRTETCH GORETEX GRAFT;  Surgeon: Angelia Mould, MD;  Location: [place];  Service: Vascular;  Laterality: Left;  . Cardiac catheterization    . Tonsillectomy    . Eye surgery    . Endarterectomy Right 04/21/2015    Procedure: ENDARTERECTOMY CAROTID;  Surgeon: Angelia Mould, MD;  Location: [place];  Service: Vascular;  Laterality: Right;  . Knee surgery Left   . Lumbar laminectomy/decompression microdiscectomy N/A 12/01/2015    Procedure: L5-S1 Decompression and Bilateral Microdiscectomy;  Surgeon: Marybelle Killings, MD;  Location:  [place];  Service: Orthopedics;  Laterality: N/A;   Family History  Problem Relation Age of Onset  . Diabetes Mother   . Hypertension Mother   . Heart disease Mother     beofre age 76  . Heart attack Mother   . Stroke Mother   . Cancer Father   . Hyperlipidemia Father   . Hypertension Father   . Deep vein thrombosis Daughter   . Diabetes Daughter   . Hyperlipidemia Daughter   . Hypertension Daughter   . Heart disease Daughter   . Peripheral vascular disease Daughter    Social History  Substance Use Topics  . Smoking status: Never Smoker   . Smokeless tobacco: Never Used  . Alcohol Use: No   OB History    No data available     Review of Systems  A complete 10 system review of systems was obtained and all systems are negative except as noted in the HPI and PMH.    Allergies  Gabapentin; Other; Sulfa antibiotics; and Amlodipine  Home Medications   Prior to Admission medications   Medication Sig Start Date End Date Taking? Authorizing Provider  acetaminophen (TYLENOL) 325 MG tablet Take 2 tablets (650 mg total) by mouth every 6 (six) hours as needed for mild pain. 12/15/15   Bary Leriche, PA-C  allopurinol (ZYLOPRIM) 100 MG tablet Take 100 mg by mouth 2 (two) times daily.     Historical Provider, MD  aspirin 325 MG tablet Take 325 mg by mouth daily.    Historical Provider, MD  atorvastatin (LIPITOR) 10 MG tablet TAKE ONE TABLET BY MOUTH ONE TIME DAILY Patient taking differently: Take 10 mg by mouth daily.  02/21/14   Jettie Booze, MD  B Complex Vitamins (VITAMIN B COMPLEX PO) Take 1 tablet by mouth daily.    Historical Provider, MD  brimonidine (ALPHAGAN P) 0.1 % SOLN Place 1 drop into the left eye 2 (two) times daily.    Historical Provider, MD  calcitRIOL (ROCALTROL) 0.25 MCG capsule Take 0.25 mcg by mouth every other day.    Historical Provider, MD  Cholecalciferol (VITAMIN D) 2000 UNITS tablet Take 2,000 Units by mouth 2 (two) times daily.    Historical Provider,  MD  citalopram (CELEXA) 20 MG tablet Take 20 mg by mouth daily.    Historical Provider, MD  Cyanocobalamin (VITAMIN B 12 PO) Take 1,000 mg by mouth.    Historical Provider, MD  diclofenac sodium (VOLTAREN) 1 % GEL Apply 2 g topically 4 (four) times daily. 12/15/15   Bary Leriche, PA-C  fish oil-omega-3 fatty acids 1000 MG capsule Take 2 g by mouth daily.    Historical Provider, MD  furosemide (  LASIX) 80 MG tablet Take 1 tablet (80 mg total) by mouth daily. 12/15/15   Ivan Anchors Love, PA-C  HUMALOG 100 UNIT/ML injection Inject 0-12 Units into the skin 3 (three) times daily with meals. Sliding scale 04/17/14   Historical Provider, MD  insulin glargine (LANTUS) 100 UNIT/ML injection Inject 0.2-0.3 mLs (20-30 Units total) into the skin at bedtime. 12/15/15   Bary Leriche, PA-C  linagliptin (TRADJENTA) 5 MG TABS tablet Take 1 tablet (5 mg total) by mouth daily with breakfast. 08/02/11   Donielle Liston Alba, PA-C  Menthol-Methyl Salicylate (MUSCLE RUB) 10-15 % CREA Apply 1 application topically 2 (two) times daily. 12/15/15   Bary Leriche, PA-C  methocarbamol (ROBAXIN) 500 MG tablet Take 1 tablet (500 mg total) by mouth 4 (four) times daily. Wean to one pill four times a day as needed after a week. 12/15/15   Bary Leriche, PA-C  metoprolol (LOPRESSOR) 100 MG tablet Take 1 tablet (100 mg total) by mouth 2 (two) times daily. 12/15/15   Bary Leriche, PA-C  omeprazole (PRILOSEC) 20 MG capsule Take 20 mg by mouth daily as needed. Acid reflux    Historical Provider, MD  prazosin (MINIPRESS) 1 MG capsule Take 1 capsule (1 mg total) by mouth at bedtime. 12/15/15   Ivan Anchors Love, PA-C  timolol (TIMOPTIC) 0.5 % ophthalmic solution Place 1 drop into the left eye daily.    Historical Provider, MD  traMADol (ULTRAM) 50 MG tablet Take 1 tablet (50 mg total) by mouth 4 (four) times daily - after meals and at bedtime. 12/15/15   Ivan Anchors Love, PA-C  travoprost, benzalkonium, (TRAVATAN) 0.004 % ophthalmic solution Place 1 drop into the  left eye at bedtime.    Historical Provider, MD   BP 151/123 mmHg  Pulse 83  Temp(Src) 98.4 F (36.9 C) (Oral)  Resp 14  Wt 200 lb (90.719 kg)  SpO2 97% Physical Exam  Constitutional: She is oriented to person, place, and time. She appears well-developed and well-nourished. No distress.  HENT:  Head: Normocephalic and atraumatic.  Moist mucous membranes  Eyes: Conjunctivae are normal. Pupils are equal, round, and reactive to light.  Neck: Neck supple.  Cardiovascular: Normal rate, regular rhythm and normal heart sounds.   No murmur heard. Pulmonary/Chest: Effort normal and breath sounds normal.  Abdominal: Soft. Bowel sounds are normal. She exhibits no distension. There is no tenderness.  Musculoskeletal: She exhibits edema.  Mild BLE edema  Neurological: She is alert and oriented to person, place, and time.  Fluent speech Sensation intact BLE  Skin: Skin is warm and dry.  Healed incision site on lumbar spine without surrounding erythema or drainage.   Psychiatric: She has a normal mood and affect. Judgment normal.  Nursing note and vitals reviewed.   ED Course  Procedures (including critical care time) DIAGNOSTIC STUDIES: Oxygen Saturation is 100% on RA, normal by my interpretation.    COORDINATION OF CARE: 12:28 AM- Pt advised of plan for treatment and pt agrees. Pt will receive lumbar and thoracic imaging for further evaluation.    Labs Review Labs Reviewed  BASIC METABOLIC PANEL - Abnormal; Notable for the following:    Potassium 3.0 (*)    Glucose, Bld 125 (*)    BUN 40 (*)    Creatinine, Ser 2.15 (*)    Calcium 8.6 (*)    GFR calc non Af Amer 21 (*)    GFR calc Af Amer 24 (*)    All other components  within normal limits  BRAIN NATRIURETIC PEPTIDE - Abnormal; Notable for the following:    B Natriuretic Peptide 796.6 (*)    All other components within normal limits  CBC WITH DIFFERENTIAL/PLATELET - Abnormal; Notable for the following:    RBC 3.58 (*)     Hemoglobin 10.9 (*)    HCT 35.0 (*)    All other components within normal limits  CBG MONITORING, ED - Abnormal; Notable for the following:    Glucose-Capillary 119 (*)    All other components within normal limits    Imaging Review Dg Thoracic Spine W/swimmers  12/21/2015  CLINICAL DATA:  Started to fall.  Back pain.  Initial encounter. EXAM: THORACIC SPINE - 3 VIEWS COMPARISON:  Two-view chest x-ray 12/04/2015 FINDINGS: There is no evidence of thoracic spine fracture. Mildly exaggerated kyphosis with diffuse degenerative disc narrowing and endplate spurring. Osteopenia. IMPRESSION: No acute finding. Electronically Signed   By: Monte Fantasia M.D.   On: 12/21/2015 01:42   Dg Lumbar Spine 2-3 Views  12/21/2015  CLINICAL DATA:  Started to fall.  Low back pain.  Initial encounter. EXAM: LUMBAR SPINE - 2-3 VIEW COMPARISON:  12/01/2015 FINDINGS: Normal lumbar segmentation. No convincing fracture. Differential appearance of the superior endplate of L5 is likely related to obliquity of imaging. No traumatic malalignment. Multilevel spondylosis and disc degeneration with narrowing and spurring greatest at L2-3. Extensive atherosclerotic calcification. Osteopenia. IMPRESSION: No convincing fracture. Electronically Signed   By: Monte Fantasia M.D.   On: 12/21/2015 01:41   Ct Head Wo Contrast  12/21/2015  CLINICAL DATA:  Fall with occipital scalp injury. Initial encounter. EXAM: CT HEAD WITHOUT CONTRAST CT CERVICAL SPINE WITHOUT CONTRAST TECHNIQUE: Multidetector CT imaging of the head and cervical spine was performed following the standard protocol without intravenous contrast. Multiplanar CT image reconstructions of the cervical spine were also generated. COMPARISON:  None. FINDINGS: CT HEAD FINDINGS Skull and Sinuses:Negative for fracture or destructive process. The visualized mastoids, middle ears, and imaged paranasal sinuses are clear. Visualized orbits: Negative. Brain: No evidence of acute infarction,  hemorrhage, hydrocephalus, or mass lesion/mass effect. Generalized atrophy, mild to moderate for age. CT CERVICAL SPINE FINDINGS Negative for acute fracture or subluxation. No prevertebral edema. No gross cervical canal hematoma. Advanced and diffuse disc and facet degeneration. C4-5 ACDF or ankylosis. Posterior endplate ridging narrows the spinal canal at multiple levels. Slight anterolisthesis at C7-T1, facet mediated. IMPRESSION: No evidence of acute intracranial or cervical spine injury. Electronically Signed   By: Monte Fantasia M.D.   On: 12/21/2015 07:15   Ct Cervical Spine Wo Contrast  12/21/2015  CLINICAL DATA:  Fall with occipital scalp injury. Initial encounter. EXAM: CT HEAD WITHOUT CONTRAST CT CERVICAL SPINE WITHOUT CONTRAST TECHNIQUE: Multidetector CT imaging of the head and cervical spine was performed following the standard protocol without intravenous contrast. Multiplanar CT image reconstructions of the cervical spine were also generated. COMPARISON:  None. FINDINGS: CT HEAD FINDINGS Skull and Sinuses:Negative for fracture or destructive process. The visualized mastoids, middle ears, and imaged paranasal sinuses are clear. Visualized orbits: Negative. Brain: No evidence of acute infarction, hemorrhage, hydrocephalus, or mass lesion/mass effect. Generalized atrophy, mild to moderate for age. CT CERVICAL SPINE FINDINGS Negative for acute fracture or subluxation. No prevertebral edema. No gross cervical canal hematoma. Advanced and diffuse disc and facet degeneration. C4-5 ACDF or ankylosis. Posterior endplate ridging narrows the spinal canal at multiple levels. Slight anterolisthesis at C7-T1, facet mediated. IMPRESSION: No evidence of acute intracranial or cervical spine injury. Electronically  Signed   By: Monte Fantasia M.D.   On: 12/21/2015 07:15       MDM   Final diagnoses:  Low back pain  Leg weakness, bilateral   Patient presents with mild low-back "irritation" after she began  feeling weak and granddaughter lowered her to the ground. No actual fall. She was well-appearing and comfortable on exam. Vital signs notable for hypertension at 219/87. I did note in her discharge note that she was taken off of some blood pressure medications, patient unsure whether she has had her blood pressure medications this evening. She was neurologically intact distally. Obtained plain filmswhich were negative for acute process.   During transfer from bed to bedside commode, the nurse tech witnessed pt start having jerking movements and she struck the back of her head, no fall or LOC. Pt neuro intact during my exam, unchanged from original exam. Obtained CT head and c-spine which were negative. Pt's daughter reports that the patient has had b/l leg weakness and difficulty walking over the past day, which is different from the progress she has made at home. I discussed w/ ortho, Dr. Sharol Given, and per his recs I have ordered MRI. Labs have shown K 3.0, for which I have given oral and IV potassium. We are awaiting MRI results. If MRI negative for acute process, will consult social work to assist with assessment to determine whether pt will require higher level of care such as rehab. Pt signed out to oncoming provider.   I personally performed the services described in this documentation, which was scribed in my presence. The recorded information has been reviewed and is accurate.   Sharlett Iles, MD 12/21/15 [phone]

## 2015-12-21 NOTE — ED Notes (Signed)
I was requested to assist Pt to the bedside commode [facility]). Pt stated she could not stand unassisted. Upon standing her, her legs we shaky and only partial weight bearing. Once ready to stand, I assisted the Pt off the [facility], at which point her legs got weak. I sat Pt back on the [facility] and called for assistance through the call bell. Pt stated her gown was wet. As I turned around to grab a new gown, Pt's daughter stood up in alarm and I heard a "bang" noise. Pt stated she had hit her head against the wall behind her while seated on the [facility]. Pt then began to shake in a full body thrash/spasm. I called once more for assistance through the call bell and helped stabilize the Pt. Assistance arrived promptly. Pt felt weak and needed to be lifted back in bed.

## 2015-12-21 NOTE — Consult Note (Signed)
Patient ID: [name] MRN: [id number] DOB/AGE: Nov 05, 1940 75 y.o.  Admit date: 12/20/2015  Admission Diagnoses:  Principal Problem:   Generalized weakness Active Problems:   Essential hypertension   Hyperlipidemia   Atrial fibrillation (HCC)   Diabetes mellitus, insulin dependent (IDDM), controlled (HCC)   Chronic kidney disease (CKD), stage V (HCC)   S/P lumbar discectomy   Lumbar back pain with radiculopathy affecting left lower extremity   Anemia of chronic kidney failure   Hx of gout   Acute hypokalemia   HPI: 75 y.o wf who is well known to me is being seen for increased left leg, knee, ankle and foot pain.  S/p L5-S1 decompression/microdiscectomy by Dr Rodell Perna 01 Dec 2015.  She was just seen by Dr Lorin Mercy yesterday in our office for postop appointment. Discharged from [facility] inpatient rehab last week.  When seen in our office yesterday she was complaining of left foot and ankle pain and was felt to be having a gout attack.  States that while at home she was getting up from the toilet left leg started hurting, shaking and got weak. She did not fall and grandaughter helped her to sit down.  She was brought to the ED and we were notified by the physician. Currently states that she is having pain in left buttock that radiates to left foot.  Left leg weakness when she stands but also has increased pain in left knee/ankle/foot as well.  Did have chronic left leg weakness preop. Lumbar spine MRI ordered in the ED and this was reviewed by Dr Basil Dess spine surgeon in our office. Denies right leg pain.    Past Medical History: Past Medical History  Diagnosis Date  . Anemia   . Glaucoma   . Hypertension   . Blood transfusion   . GERD (gastroesophageal reflux disease)   . Arthritis   . Neuromuscular disorder ([place])     CARPEL TUNNEL  . Atrial fibrillation ([place])   . CAD (coronary artery disease)   . Carotid artery occlusion     Carotid Endartectom,y - left 2009.  Blockage  Right being watched by Dr Scot Dock.  Marland Kitchen HOH (hard of hearing)   . Shortness of breath   . Depression   . History of kidney stones     passed  . Carotid stenosis   . Complication of anesthesia     pt. states that she was difficult to wake  . Stroke ([place])     hx of TIA  . 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 Cancer ([place])     .  top of head- melonoma  . Dysrhythmia   . History of pneumonia   . Diabetes mellitus without complication ([place])   . Myocardial infarction ([place])   . History of hiatal hernia   . Pneumonia     Surgical History: Past Surgical History  Procedure Laterality Date  . Carpel tunnel    . Neck surgery    . Esophagogastroduodenoscopy  07/25/2011    Procedure: ESOPHAGOGASTRODUODENOSCOPY (EGD);  Surgeon: Winfield Cunas., MD;  Location: [facility] ENDOSCOPY;  Service: Endoscopy;  Laterality: N/A;  . Colonoscopy  07/25/2011    Procedure: COLONOSCOPY;  Surgeon: Winfield Cunas., MD;  Location: [facility] ENDOSCOPY;  Service: Endoscopy;  Laterality: N/A;  . Coronary artery bypass graft  07/29/2011    Procedure: CORONARY ARTERY BYPASS GRAFTING (CABG);  Surgeon: Gaye Pollack, MD;  Location: [place];  Service: Open Heart Surgery;  Laterality: N/A;  . Carotid endarterectomy Left 2009     CEA  . Av fistula placement Left 05/31/2014    Procedure: Creation of Left Arm arteriovenous brachiocephalic Fistula;  Surgeon: Angelia Mould, MD;  Location: [place];  Service: Vascular;  Laterality: Left;  . Fistulogram Left 08/29/2014    Procedure: FISTULOGRAM;  Surgeon: Angelia Mould, MD;  Location: [facility] CATH LAB;  Service: Cardiovascular;  Laterality: Left;  . Av fistula placement Left 09/01/2014    Procedure: INSERTION OF ARTERIOVENOUS (AV) GORE-TEX GRAFT LEFT UPPER ARM USING  4-7 MM X 45 CM SRTETCH GORETEX GRAFT;  Surgeon: Angelia Mould, MD;  Location: [place];  Service: Vascular;  Laterality: Left;  . Cardiac catheterization    . Tonsillectomy    . Eye surgery     . Endarterectomy Right 04/21/2015    Procedure: ENDARTERECTOMY CAROTID;  Surgeon: Angelia Mould, MD;  Location: [place];  Service: Vascular;  Laterality: Right;  . Knee surgery Left   . Lumbar laminectomy/decompression microdiscectomy N/A 12/01/2015    Procedure: L5-S1 Decompression and Bilateral Microdiscectomy;  Surgeon: Marybelle Killings, MD;  Location: [place];  Service: Orthopedics;  Laterality: N/A;    Family History: Family History  Problem Relation Age of Onset  . Diabetes Mother   . Hypertension Mother   . Heart disease Mother     beofre age 92  . Heart attack Mother   . Stroke Mother   . Cancer Father   . Hyperlipidemia Father   . Hypertension Father   . Deep vein thrombosis Daughter   . Diabetes Daughter   . Hyperlipidemia Daughter   . Hypertension Daughter   . Heart disease Daughter   . Peripheral vascular disease Daughter     Social History: Social History   Social History  . Marital Status: Married    Spouse Name: N/A  . Number of Children: N/A  . Years of Education: N/A   Occupational History  . Not on file.   Social History Main Topics  . Smoking status: Never Smoker   . Smokeless tobacco: Never Used  . Alcohol Use: No  . Drug Use: No  . Sexual Activity: No   Other Topics Concern  . Not on file   Social History Narrative    Allergies: Gabapentin; Other; Sulfa antibiotics; and Amlodipine  Medications: I have reviewed the patient's current medications.  Vital Signs: Patient Vitals for the past 24 hrs:  BP Temp Temp src Pulse Resp SpO2 Height Weight  12/21/15 1830 (!) 183/67 mmHg - - - - - - -  12/21/15 1725 (!) 202/84 mmHg - - 94 18 97 % 5' 4.5" (1.638 m) 90.5 kg (199 lb 8.3 oz)  12/21/15 1656 188/92 mmHg - - 90 18 98 % - -  12/21/15 1430 183/95 mmHg - - 97 18 97 % - -  12/21/15 1400 194/99 mmHg - - 97 18 95 % - -  12/21/15 1330 189/90 mmHg - - 100 - 98 % - -  12/21/15 1300 (!) 186/117 mmHg - - 91 - 98 % - -  12/21/15 1230 185/61 mmHg -  - 95 - 96 % - -  12/21/15 1127 188/84 mmHg - - 88 18 97 % - -  12/21/15 0900 190/80 mmHg - - 86 16 98 % - -  12/21/15 0815 185/76 mmHg - - 83 17 98 % - -  12/21/15 0800 - - - 83 14 97 % - -  12/21/15 0700 (!) 151/123 mmHg - - 83 14 96 % - -  12/21/15 0528 (!) 208/84 mmHg - - - - - - -  12/21/15 0515 (!) 208/84 mmHg - - 95 17 98 % - -  12/21/15 0500 (!) 202/94 mmHg - - 97 19 97 % - -  12/21/15 0430 (!) 214/87 mmHg - - 93 18 97 % - -  12/21/15 0415 182/83 mmHg - - 89 15 97 % - -  12/21/15 0215 198/98 mmHg - - 94 14 97 % - -  12/21/15 0200 197/81 mmHg - - 95 15 98 % - -  12/21/15 0145 (!) 207/76 mmHg - - 94 13 97 % - -  12/21/15 0130 (!) 209/82 mmHg - - 93 15 96 % - -  12/21/15 0126 164/93 mmHg - - - 14 - - -  12/21/15 0030 (!) 212/89 mmHg - - - 15 - - -  12/21/15 0015 (!) 214/91 mmHg - - 92 16 99 % - -  12/21/15 0008 (!) 219/87 mmHg - - 93 - 99 % - -  12/21/15 0003 (!) 219/87 mmHg 98.4 F (36.9 C) Oral 96 16 100 % - 90.719 kg (200 lb)    Radiology: Dg Chest 2 View  12/04/2015  CLINICAL DATA:  Wheezing. EXAM: CHEST  2 VIEW COMPARISON:  Nov 23, 2015. FINDINGS: Stable cardiomediastinal silhouette. Status post coronary artery bypass graft. No pneumothorax or pleural effusion is noted. No acute pulmonary disease is noted. Bony thorax is unremarkable. IMPRESSION: No active cardiopulmonary disease. Electronically Signed   By: Marijo Conception, M.D.   On: 12/04/2015 20:26   Dg Chest 2 View  11/23/2015  CLINICAL DATA:  Preoperative study prior to lumbar surgery; history of coronary artery disease, CVA, and atrial fibrillation EXAM: CHEST  2 VIEW COMPARISON:  PA and lateral chest x-ray of July 13, 2015 FINDINGS: The lungs are hyperinflated with hemidiaphragm flattening. There is no focal infiltrate or pleural effusion. The cardiac silhouette is enlarged. The patient has undergone previous CABG. There is no pulmonary vascular congestion. The sternal wires are intact. The retrosternal soft tissues  appear normal. There is mild multilevel degenerative disc disease of the thoracic spine. IMPRESSION: Cardiomegaly without pulmonary edema. Mild hyperinflation which may be voluntary but likely reflects chronic bronchitic change or reactive airway disease. There is no pneumonia. Electronically Signed   By: David  Martinique M.D.   On: 11/23/2015 13:46   Dg Thoracic Spine W/swimmers  12/21/2015  CLINICAL DATA:  Started to fall.  Back pain.  Initial encounter. EXAM: THORACIC SPINE - 3 VIEWS COMPARISON:  Two-view chest x-ray 12/04/2015 FINDINGS: There is no evidence of thoracic spine fracture. Mildly exaggerated kyphosis with diffuse degenerative disc narrowing and endplate spurring. Osteopenia. IMPRESSION: No acute finding. Electronically Signed   By: Monte Fantasia M.D.   On: 12/21/2015 01:42   Dg Lumbar Spine 2-3 Views  12/21/2015  CLINICAL DATA:  Started to fall.  Low back pain.  Initial encounter. EXAM: LUMBAR SPINE - 2-3 VIEW COMPARISON:  12/01/2015 FINDINGS: Normal lumbar segmentation. No convincing fracture. Differential appearance of the superior endplate of L5 is likely related to obliquity of imaging. No traumatic malalignment. Multilevel spondylosis and disc degeneration with narrowing and spurring greatest at L2-3. Extensive atherosclerotic calcification. Osteopenia. IMPRESSION: No convincing fracture. Electronically Signed   By: Monte Fantasia M.D.   On: 12/21/2015 01:41   Dg Lumbar Spine 2-3 Views  12/01/2015  CLINICAL DATA:  Portable imaging of the lumbar spine  for lumbar spine surgical localization. EXAM: LUMBAR SPINE - 2-3 VIEW COMPARISON:  Lumbar MRI, 10/14/2015 FINDINGS: Initial image shows placement of a surgical needle posterior to the L4-L5 disc. Subsequent image shows placement of a surgical insert it between posterior skin retractors. The tip projects 3 cm posterior to the posterior margin of the L5-S1 disc. IMPRESSION: Surgical localization imaging as described. Electronically Signed   By:  Lajean Manes M.D.   On: 12/01/2015 11:02   Dg Elbow 2 Views Right  12/09/2015  CLINICAL DATA:  Posterior elbow pain and swelling. EXAM: RIGHT ELBOW - 2 VIEW COMPARISON:  None. FINDINGS: There is no evidence of fracture, dislocation, or joint effusion. There is no evidence of arthropathy or other focal bone abnormality. Dystrophic calcification at the origin of the right common extensor tendon origin. There is peripheral vascular atherosclerotic disease. IMPRESSION: No acute osseous injury of the right elbow. Electronically Signed   By: Kathreen Devoid   On: 12/09/2015 11:50   Dg Abd 1 View  12/04/2015  CLINICAL DATA:  Abdominal pain. EXAM: ABDOMEN - 1 VIEW COMPARISON:  None. FINDINGS: The bowel gas pattern is normal. No radio-opaque calculi or other significant radiographic abnormality are seen. IMPRESSION: No evidence of bowel obstruction or ileus. Electronically Signed   By: Marijo Conception, M.D.   On: 12/04/2015 20:28   Ct Head Wo Contrast  12/21/2015  CLINICAL DATA:  Fall with occipital scalp injury. Initial encounter. EXAM: CT HEAD WITHOUT CONTRAST CT CERVICAL SPINE WITHOUT CONTRAST TECHNIQUE: Multidetector CT imaging of the head and cervical spine was performed following the standard protocol without intravenous contrast. Multiplanar CT image reconstructions of the cervical spine were also generated. COMPARISON:  None. FINDINGS: CT HEAD FINDINGS Skull and Sinuses:Negative for fracture or destructive process. The visualized mastoids, middle ears, and imaged paranasal sinuses are clear. Visualized orbits: Negative. Brain: No evidence of acute infarction, hemorrhage, hydrocephalus, or mass lesion/mass effect. Generalized atrophy, mild to moderate for age. CT CERVICAL SPINE FINDINGS Negative for acute fracture or subluxation. No prevertebral edema. No gross cervical canal hematoma. Advanced and diffuse disc and facet degeneration. C4-5 ACDF or ankylosis. Posterior endplate ridging narrows the spinal canal at  multiple levels. Slight anterolisthesis at C7-T1, facet mediated. IMPRESSION: No evidence of acute intracranial or cervical spine injury. Electronically Signed   By: Monte Fantasia M.D.   On: 12/21/2015 07:15   Ct Cervical Spine Wo Contrast  12/21/2015  CLINICAL DATA:  Fall with occipital scalp injury. Initial encounter. EXAM: CT HEAD WITHOUT CONTRAST CT CERVICAL SPINE WITHOUT CONTRAST TECHNIQUE: Multidetector CT imaging of the head and cervical spine was performed following the standard protocol without intravenous contrast. Multiplanar CT image reconstructions of the cervical spine were also generated. COMPARISON:  None. FINDINGS: CT HEAD FINDINGS Skull and Sinuses:Negative for fracture or destructive process. The visualized mastoids, middle ears, and imaged paranasal sinuses are clear. Visualized orbits: Negative. Brain: No evidence of acute infarction, hemorrhage, hydrocephalus, or mass lesion/mass effect. Generalized atrophy, mild to moderate for age. CT CERVICAL SPINE FINDINGS Negative for acute fracture or subluxation. No prevertebral edema. No gross cervical canal hematoma. Advanced and diffuse disc and facet degeneration. C4-5 ACDF or ankylosis. Posterior endplate ridging narrows the spinal canal at multiple levels. Slight anterolisthesis at C7-T1, facet mediated. IMPRESSION: No evidence of acute intracranial or cervical spine injury. Electronically Signed   By: Monte Fantasia M.D.   On: 12/21/2015 07:15   Mr Lumbar Spine Wo Contrast  12/21/2015  CLINICAL DATA:  68 -year-old female with  recurrent lumbar back pain after recent fall. Generalized weakness. Lower extremity muscle spasms. Initial encounter. EXAM: MRI LUMBAR SPINE WITHOUT CONTRAST TECHNIQUE: Multiplanar, multisequence MR imaging of the lumbar spine was performed. No intravenous contrast was administered. COMPARISON:  Lumbar radiographs 0045 hours today. Lumbar MRI for 117. FINDINGS: Study is intermittently degraded by motion  artifact despite repeated imaging attempts. Segmentation: Normal as demonstrated on today radiographs common this is the same numbering system used on the prior MRI. Alignment: Stable since April. No compression fracture. No definite spondylolisthesis. Vertebrae: Postoperative changes to the bilateral posterior elements at L5-S1, see details below. No superimposed acute osseous abnormality. T1 bone marrow signal appears stable and within normal limits. Conus medullaris: Extends to the L1-L2 level and appears normal. No lower thoracic spinal cord signal abnormality. Paraspinal and other soft tissues: Stable and negative visualized abdominal viscera. Postoperative changes to the posterior paraspinal soft tissues at L4, L5, and S1. Subcutaneous edema, including at the skin incision site to the left of midline at L4-L5 (series 5, image 10). This fluid does not appear organized or drainable. Mild fluid or edema along the surgical approach is through the posterior elements at L5-S1, greater on the right (series 5, image 4). See additional details of that level below. Disc levels: The levels T10-T11 through L3-L4 appear stable and largely unremarkable for age. L4-L5: Stable mostly far lateral disc osteophyte complex. Stable posterior elements. No spinal, lateral recess, or significant foraminal stenosis. L5-S1: Postoperative changes tracking from the bilateral posterior elements to the bilateral lateral recesses best seen on series 6, images 34 and 35. Architectural distortion at the lateral recesses, more so the right. Underlying disc space loss with bulky circumferential disc osteophyte complex and moderate bilateral facet hypertrophy. Mildly decreased thecal sac patency compared to the preoperative study (series 6, image 35). Previously-seen moderate to severe bilateral L5 foraminal stenosis does not appear significantly changed. IMPRESSION: 1. Postoperative changes bilaterally at L5-S1, greater on the right and with  associated lateral recess architectural distortion. Mildly decreased thecal sac patency compared to the preoperative study in April. 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 2. Posterior paraspinal soft tissue edema without organized or drainable fluid collection. 3. Other lumbar levels are stable since April with no other lumbar spinal stenosis. Electronically Signed   By: Genevie Ann M.D.   On: 12/21/2015 10:40   US Renal  12/07/2015  CLINICAL DATA:  75 year old female with acute kidney injury on chronic kidney disease. EXAM: RENAL / URINARY TRACT ULTRASOUND COMPLETE COMPARISON:  None. FINDINGS: Right Kidney: Length: 12 cm. Cortical atrophy noted with increased renal echogenicity. There is no evidence of hydronephrosis or solid mass. A 1.2 cm cyst is present. Left Kidney: Length: 10.6 cm. Cortical atrophy noted with increased renal echogenicity. There is no evidence of hydronephrosis or solid mass. Bladder: A Foley catheter is present within a collapsed bladder. IMPRESSION: Bilateral renal cortical atrophy and increased renal echogenicity compatible with medical renal disease. No evidence of hydronephrosis. Electronically Signed   By: Margarette Canada M.D.   On: 12/07/2015 16:45    Labs:  Recent Labs  12/21/15 0445  WBC 8.8  RBC 3.58*  HCT 35.0*  PLT 189    Recent Labs  12/21/15 0445 12/21/15 1803  NA 142 141  K 3.0* 5.1  CL 105 107  CO2 27 24  BUN 40* 40*  CREATININE 2.15* 2.14*  GLUCOSE 125* 106*  CALCIUM 8.6* 8.5*   No results for input(s): LABPT, INR in the last  72 hours.  Review of Systems: Review of Systems  Constitutional: Negative for fever.  HENT: Negative.   Respiratory: Negative.   Musculoskeletal: Positive for back pain and joint pain. Negative for falls.  Neurological: Positive for focal weakness and weakness. Negative for loss of consciousness.  Psychiatric/Behavioral: Negative.     Physical Exam: Very pleasant elderly wf alert and  oriented in no acute distress.  No respiratory distress.  Head is normocephalic, atraumatic.  Patient does have swelling of upper and lower extremities which is more than she had preop and during her hospital admission for back surgery.  Negative log roll bilat hips.  She can do a bilat straight leg raise.  Left knee does have some swelling with small effusion. Left knee is diffusely tender.  No signs of infection.  Good knee ROM.  Left calf some swelling and mod to markedly tender.  Left ankle and foot has some swelling and also diffusely tender. She does have some left anterior tib, ehl and gastroc weakness but this seems improved possibly from preop. Strong on the right.  Right calf nontender.    Assessment and Plan: 1) S/p L5-S1 decompression/microdiscectomy 01 Dec 2015 2) Increased left lower pain and feeling of weakness.  Extensive atherosclerotic calcification seen on lumbar xray. 3) left knee/ankle/foot pain.  Has had previous left knee arthroscopy. Worse last few days.  Question Gout attack.  Elevated uric acid.   Dr Louanne Skye will review sed rate tomorrow.  He did look at lumbar MRI earlier and does not think that there is an infectious process.  C reactive protein normal.  Will get xrays left knee, ankle and foot. Did discuss with patient possibly trying dx/tx knee, ankle injections.   Also ordered STAT left LE venous and arterial dopplers.  May consider getting a NCV/EMG study depending on the results vs neurology consult to get there opinion.  Discussed treatment plan with patient and family members present.     Alyson Locket. Ricard Dillon PA-C For Rodell Perna MD [facility] orthopedics

## 2015-12-21 NOTE — H&P (Signed)
History and Physical    [name] [id number] DOB: 1940-08-21 DOA: 12/20/2015  PCP: Henrine Screws, MD   Patient coming from/Resides with: Private residence/lives with husband  Chief Complaint: Generalized weakness  HPI: [name] is a 75 y.o. 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 Patient recently underwent elective lumbar discectomy in late May and went to rehabilitation at [facility] and was discharged on 6/2 with home health PT with pain managed effectively with tramadol 50 mg 4 times a day. Yesterday the patient went to see her orthopedic surgeon and was ambulating well with only minimal back pain but by the time she returned home from appointment she began complaining of generalized weakness and was unable to stand any longer and was assisted to the floor by her granddaughter. She did not fall and she did not sustain any injuries. Shortly thereafter she presented to the ER for evaluation and treatment. An MRI demonstrated no evidence of infectious processes or abnormalities at the surgical site. Patient has had significant pain in her back and left hip that has been difficult to control even after arrival to the ER. Today she was assisted up to the bedside commode and developed generalized myoclonus type activity without true seizure activity or postictal phase noting she remained alert during the entire episode. Patient does not have a history of seizures. Poor oral intake since dc.  ED Course:  Initial vital signs: po 98.4-BP 212/89-pulse 92-respirations 15-room air saturations 99% Most recent vital signs: BP183/95-pulse 97-respirations 18 X-ray thoracic spine: No acute findings X-ray lumbar spine: No convincing fracture CT cervical spine and head  without contrast: No evidence of acute intracranial or cervical spine injury MRI spine without contrast: Stable postoperative changes; posterior paraspinal soft tissue edema without organized drainable fluid collections Lab Data: Sodium 142, potassium 3.0, BUN 40, creatinine 2.15, glucose 125, anion gap 10, BNP 797, WBCs 8800 with normal differential, hemoglobin 10.9, platelets 189,000 Lopressor 100 mg by mouth 1 Minipress 1 mg by mouth 1 Potassium bolus 10 mEq 2 K Dur 40 mEq by mouth 1 Lasix 80 mg by mouth 1 Tylenol 1000 mg by mouth 1 Percocet 5/325 by mouth 1   Review of Systems:  In addition to the HPI above,  No Fever-chills, myalgias or other constitutional symptoms No Headache, changes with Vision or hearing, tingling, numbness in any extremity, generalized weakness with what appears to be exertional or postural myoclonus No problems swallowing food or Liquids, indigestion/reflux No Chest pain, Cough or Shortness of Breath, palpitations, orthopnea or DOE No Abdominal pain, N/V; no melena or hematochezia, no dark tarry stools, Bowel movements are regular, No dysuria, hematuria or flank pain No new skin rashes, lesions, masses or bruises, No new joints pains-aches No recent weight gain or loss No polyuria, polydypsia or polyphagia,   Past Medical History  Diagnosis Date  . Anemia   . Glaucoma   . Hypertension   . Blood transfusion   . GERD (gastroesophageal reflux disease)   . Arthritis   . Neuromuscular disorder ([place])     CARPEL TUNNEL  . Atrial fibrillation ([place])   . CAD (coronary artery disease)   . Carotid artery occlusion     Carotid Endartectom,y - left 2009.  Blockage Right being watched by Dr Scot Dock.  Marland Kitchen HOH (hard of hearing)   . Shortness  of breath   . Depression   . History of kidney stones     passed  . Carotid stenosis   . Complication of anesthesia     pt. states that she was difficult to wake  . Stroke ([place])     hx of TIA  . Chronic kidney  disease     patient states stage IV  . Cancer ([place])     .  top of head- melonoma  . Dysrhythmia   . History of pneumonia   . Diabetes mellitus without complication ([place])   . Myocardial infarction ([place])   . History of hiatal hernia   . Pneumonia     Past Surgical History  Procedure Laterality Date  . Carpel tunnel    . Neck surgery    . Esophagogastroduodenoscopy  07/25/2011    Procedure: ESOPHAGOGASTRODUODENOSCOPY (EGD);  Surgeon: Winfield Cunas., MD;  Location: [facility] ENDOSCOPY;  Service: Endoscopy;  Laterality: N/A;  . Colonoscopy  07/25/2011    Procedure: COLONOSCOPY;  Surgeon: Winfield Cunas., MD;  Location: [facility] ENDOSCOPY;  Service: Endoscopy;  Laterality: N/A;  . Coronary artery bypass graft  07/29/2011    Procedure: CORONARY ARTERY BYPASS GRAFTING (CABG);  Surgeon: Gaye Pollack, MD;  Location: [place];  Service: Open Heart Surgery;  Laterality: N/A;  . Carotid endarterectomy Left 2009     CEA  . Av fistula placement Left 05/31/2014    Procedure: Creation of Left Arm arteriovenous brachiocephalic Fistula;  Surgeon: Angelia Mould, MD;  Location: [place];  Service: Vascular;  Laterality: Left;  . Fistulogram Left 08/29/2014    Procedure: FISTULOGRAM;  Surgeon: Angelia Mould, MD;  Location: [facility] CATH LAB;  Service: Cardiovascular;  Laterality: Left;  . Av fistula placement Left 09/01/2014    Procedure: INSERTION OF ARTERIOVENOUS (AV) GORE-TEX GRAFT LEFT UPPER ARM USING  4-7 MM X 45 CM SRTETCH GORETEX GRAFT;  Surgeon: Angelia Mould, MD;  Location: [place];  Service: Vascular;  Laterality: Left;  . Cardiac catheterization    . Tonsillectomy    . Eye surgery    . Endarterectomy Right 04/21/2015    Procedure: ENDARTERECTOMY CAROTID;  Surgeon: Angelia Mould, MD;  Location: [place];  Service: Vascular;  Laterality: Right;  . Knee surgery Left   . Lumbar laminectomy/decompression microdiscectomy N/A 12/01/2015    Procedure: L5-S1 Decompression and Bilateral  Microdiscectomy;  Surgeon: Marybelle Killings, MD;  Location: [place];  Service: Orthopedics;  Laterality: N/A;    Social History   Social History  . Marital Status: Married    Spouse Name: N/A  . Number of Children: N/A  . Years of Education: N/A   Occupational History  . Not on file.   Social History Main Topics  . Smoking status: Never Smoker   . Smokeless tobacco: Never Used  . Alcohol Use: No  . Drug Use: No  . Sexual Activity: No   Other Topics Concern  . Not on file   Social History Narrative    Mobility: Rolling walker Work history: Not obtained   Allergies  Allergen Reactions  . Gabapentin Other (See Comments)    hallucinations  . Other     Other reaction(s): Fatigue, Gout, Hallucintions  . Sulfa Antibiotics     Other reaction(s): psychiatric disturbances  . Amlodipine Other (See Comments)    edema    Family History  Problem Relation Age of Onset  . Diabetes Mother   . Hypertension Mother   .  Heart disease Mother     beofre age 27  . Heart attack Mother   . Stroke Mother   . Cancer Father   . Hyperlipidemia Father   . Hypertension Father   . Deep vein thrombosis Daughter   . Diabetes Daughter   . Hyperlipidemia Daughter   . Hypertension Daughter   . Heart disease Daughter   . Peripheral vascular disease Daughter      Prior to Admission medications   Medication Sig Start Date End Date Taking? Authorizing Provider  acetaminophen (TYLENOL) 325 MG tablet Take 2 tablets (650 mg total) by mouth every 6 (six) hours as needed for mild pain. 12/15/15   Bary Leriche, PA-C  allopurinol (ZYLOPRIM) 100 MG tablet Take 100 mg by mouth 2 (two) times daily.     Historical Provider, MD  aspirin 325 MG tablet Take 325 mg by mouth daily.    Historical Provider, MD  atorvastatin (LIPITOR) 10 MG tablet TAKE ONE TABLET BY MOUTH ONE TIME DAILY Patient taking differently: Take 10 mg by mouth daily.  02/21/14   Jettie Booze, MD  B Complex Vitamins (VITAMIN B COMPLEX  PO) Take 1 tablet by mouth daily.    Historical Provider, MD  brimonidine (ALPHAGAN P) 0.1 % SOLN Place 1 drop into the left eye 2 (two) times daily.    Historical Provider, MD  calcitRIOL (ROCALTROL) 0.25 MCG capsule Take 0.25 mcg by mouth every other day.    Historical Provider, MD  Cholecalciferol (VITAMIN D) 2000 UNITS tablet Take 2,000 Units by mouth 2 (two) times daily.    Historical Provider, MD  citalopram (CELEXA) 20 MG tablet Take 20 mg by mouth daily.    Historical Provider, MD  Cyanocobalamin (VITAMIN B 12 PO) Take 1,000 mg by mouth.    Historical Provider, MD  diclofenac sodium (VOLTAREN) 1 % GEL Apply 2 g topically 4 (four) times daily. 12/15/15   Bary Leriche, PA-C  fish oil-omega-3 fatty acids 1000 MG capsule Take 2 g by mouth daily.    Historical Provider, MD  furosemide (LASIX) 80 MG tablet Take 1 tablet (80 mg total) by mouth daily. 12/15/15   Ivan Anchors Love, PA-C  HUMALOG 100 UNIT/ML injection Inject 0-12 Units into the skin 3 (three) times daily with meals. Sliding scale 04/17/14   Historical Provider, MD  insulin glargine (LANTUS) 100 UNIT/ML injection Inject 0.2-0.3 mLs (20-30 Units total) into the skin at bedtime. 12/15/15   Bary Leriche, PA-C  linagliptin (TRADJENTA) 5 MG TABS tablet Take 1 tablet (5 mg total) by mouth daily with breakfast. 08/02/11   Donielle Liston Alba, PA-C  Menthol-Methyl Salicylate (MUSCLE RUB) 10-15 % CREA Apply 1 application topically 2 (two) times daily. 12/15/15   Bary Leriche, PA-C  methocarbamol (ROBAXIN) 500 MG tablet Take 1 tablet (500 mg total) by mouth 4 (four) times daily. Wean to one pill four times a day as needed after a week. 12/15/15   Bary Leriche, PA-C  metoprolol (LOPRESSOR) 100 MG tablet Take 1 tablet (100 mg total) by mouth 2 (two) times daily. 12/15/15   Bary Leriche, PA-C  omeprazole (PRILOSEC) 20 MG capsule Take 20 mg by mouth daily as needed. Acid reflux    Historical Provider, MD  prazosin (MINIPRESS) 1 MG capsule Take 1 capsule (1 mg  total) by mouth at bedtime. 12/15/15   Ivan Anchors Love, PA-C  timolol (TIMOPTIC) 0.5 % ophthalmic solution Place 1 drop into the left eye daily.  Historical Provider, MD  traMADol (ULTRAM) 50 MG tablet Take 1 tablet (50 mg total) by mouth 4 (four) times daily - after meals and at bedtime. 12/15/15   Ivan Anchors Love, PA-C  travoprost, benzalkonium, (TRAVATAN) 0.004 % ophthalmic solution Place 1 drop into the left eye at bedtime.    Historical Provider, MD    Physical Exam: Filed Vitals:   12/21/15 1300 12/21/15 1330 12/21/15 1400 12/21/15 1430  BP: 186/117 189/90 194/99 183/95  Pulse: 91 100 97 97  Temp:      TempSrc:      Resp:   18 18  Weight:      SpO2: 98% 98% 95% 97%      Constitutional: NAD, calm, comfortableWhen not moving Eyes: PERRL, lids and conjunctivae normal ENMT: Mucous membranes are moist. Posterior pharynx clear of any exudate or lesions.Normal dentition.  Neck: normal, supple, no masses, no thyromegaly Respiratory: clear to auscultation bilaterally, no wheezing, no crackles. Normal respiratory effort. No accessory muscle use.  Cardiovascular: Regular rate and rhythm, no murmurs / rubs / gallops. No extremity edema. 2+ pedal pulses. No carotid bruits.  Abdomen: no tenderness, no masses palpated. No hepatosplenomegaly. Bowel sounds positive.  Musculoskeletal: no clubbing / cyanosis. No joint deformity upper and lower extremities. Good ROM, no contractures. Normal muscle tone. Tenderness reproduced with palpation over left hip/psoas muscle region-left knee hot to touch without obvious erythema Skin: no rashes, lesions, ulcers. No induration-her surgical scar well-healed although tender to palpation without evidence of fluid collection on inspection and palpation Neurologic: CN 2-12 grossly intact-Baseline. Sensation intact, DTR normal without evidence of hyperreflexia. Strength 4/5 bilateral lower extremities with upper extremity strength normal Psychiatric: Normal judgment and  insight. Alert and oriented x 3. Normal mood.    Labs on Admission: I have personally reviewed following labs and imaging studies  CBC:  Recent Labs Lab 12/15/15 0446 12/21/15 0445  WBC 9.3 8.8  NEUTROABS  --  6.3  HGB 9.5* 10.9*  HCT 30.7* 35.0*  MCV 97.8 97.8  PLT 270 [id number]   Basic Metabolic Panel:  Recent Labs Lab 12/15/15 0446 12/21/15 0445  NA 139 142  K 3.5 3.0*  CL 105 105  CO2 27 27  GLUCOSE 112* 125*  BUN 46* 40*  CREATININE 1.99* 2.15*  CALCIUM 9.4 8.6*   GFR: Estimated Creatinine Clearance: 24.5 mL/min (by C-G formula based on Cr of 2.15). Liver Function Tests: No results for input(s): AST, ALT, ALKPHOS, BILITOT, PROT, ALBUMIN in the last 168 hours. No results for input(s): LIPASE, AMYLASE in the last 168 hours. No results for input(s): AMMONIA in the last 168 hours. Coagulation Profile: No results for input(s): INR, PROTIME in the last 168 hours. Cardiac Enzymes: No results for input(s): CKTOTAL, CKMB, CKMBINDEX, TROPONINI in the last 168 hours. BNP (last 3 results) No results for input(s): PROBNP in the last 8760 hours. HbA1C: No results for input(s): HGBA1C in the last 72 hours. CBG:  Recent Labs Lab 12/14/15 1645 12/14/15 2036 12/15/15 0636 12/21/15 0359  GLUCAP 140* 129* 115* 119*   Lipid Profile: No results for input(s): CHOL, HDL, LDLCALC, TRIG, CHOLHDL, LDLDIRECT in the last 72 hours. Thyroid Function Tests: No results for input(s): TSH, T4TOTAL, FREET4, T3FREE, THYROIDAB in the last 72 hours. Anemia Panel: No results for input(s): VITAMINB12, FOLATE, FERRITIN, TIBC, IRON, RETICCTPCT in the last 72 hours. Urine analysis:    Component Value Date/Time   COLORURINE YELLOW 12/07/2015 1509   APPEARANCEUR HAZY* 12/07/2015 1509   LABSPEC 1.015 12/07/2015  [place] 5.0 12/07/2015 1509   GLUCOSEU NEGATIVE 12/07/2015 1509   HGBUR TRACE* 12/07/2015 1509   BILIRUBINUR NEGATIVE 12/07/2015 1509   [place] 12/07/2015 1509    PROTEINUR 100* 12/07/2015 1509   UROBILINOGEN 0.2 04/19/2015 1248   NITRITE POSITIVE* 12/07/2015 1509   LEUKOCYTESUR NEGATIVE 12/07/2015 1509   Sepsis Labs: @LABRCNTIP (procalcitonin:4,lacticidven:4) )No results found for this or any previous visit (from the past 240 hour(s)).   Radiological Exams on Admission: Dg Thoracic Spine W/swimmers  12/21/2015  CLINICAL DATA:  Started to fall.  Back pain.  Initial encounter. EXAM: THORACIC SPINE - 3 VIEWS COMPARISON:  Two-view chest x-ray 12/04/2015 FINDINGS: There is no evidence of thoracic spine fracture. Mildly exaggerated kyphosis with diffuse degenerative disc narrowing and endplate spurring. Osteopenia. IMPRESSION: No acute finding. Electronically Signed   By: Monte Fantasia M.D.   On: 12/21/2015 01:42   Dg Lumbar Spine 2-3 Views  12/21/2015  CLINICAL DATA:  Started to fall.  Low back pain.  Initial encounter. EXAM: LUMBAR SPINE - 2-3 VIEW COMPARISON:  12/01/2015 FINDINGS: Normal lumbar segmentation. No convincing fracture. Differential appearance of the superior endplate of L5 is likely related to obliquity of imaging. No traumatic malalignment. Multilevel spondylosis and disc degeneration with narrowing and spurring greatest at L2-3. Extensive atherosclerotic calcification. Osteopenia. IMPRESSION: No convincing fracture. Electronically Signed   By: Monte Fantasia M.D.   On: 12/21/2015 01:41   Ct Head Wo Contrast  12/21/2015  CLINICAL DATA:  Fall with occipital scalp injury. Initial encounter. EXAM: CT HEAD WITHOUT CONTRAST CT CERVICAL SPINE WITHOUT CONTRAST TECHNIQUE: Multidetector CT imaging of the head and cervical spine was performed following the standard protocol without intravenous contrast. Multiplanar CT image reconstructions of the cervical spine were also generated. COMPARISON:  None. FINDINGS: CT HEAD FINDINGS Skull and Sinuses:Negative for fracture or destructive process. The visualized mastoids, middle ears, and imaged paranasal sinuses  are clear. Visualized orbits: Negative. Brain: No evidence of acute infarction, hemorrhage, hydrocephalus, or mass lesion/mass effect. Generalized atrophy, mild to moderate for age. CT CERVICAL SPINE FINDINGS Negative for acute fracture or subluxation. No prevertebral edema. No gross cervical canal hematoma. Advanced and diffuse disc and facet degeneration. C4-5 ACDF or ankylosis. Posterior endplate ridging narrows the spinal canal at multiple levels. Slight anterolisthesis at C7-T1, facet mediated. IMPRESSION: No evidence of acute intracranial or cervical spine injury. Electronically Signed   By: Monte Fantasia M.D.   On: 12/21/2015 07:15   Ct Cervical Spine Wo Contrast  12/21/2015  CLINICAL DATA:  Fall with occipital scalp injury. Initial encounter. EXAM: CT HEAD WITHOUT CONTRAST CT CERVICAL SPINE WITHOUT CONTRAST TECHNIQUE: Multidetector CT imaging of the head and cervical spine was performed following the standard protocol without intravenous contrast. Multiplanar CT image reconstructions of the cervical spine were also generated. COMPARISON:  None. FINDINGS: CT HEAD FINDINGS Skull and Sinuses:Negative for fracture or destructive process. The visualized mastoids, middle ears, and imaged paranasal sinuses are clear. Visualized orbits: Negative. Brain: No evidence of acute infarction, hemorrhage, hydrocephalus, or mass lesion/mass effect. Generalized atrophy, mild to moderate for age. CT CERVICAL SPINE FINDINGS Negative for acute fracture or subluxation. No prevertebral edema. No gross cervical canal hematoma. Advanced and diffuse disc and facet degeneration. C4-5 ACDF or ankylosis. Posterior endplate ridging narrows the spinal canal at multiple levels. Slight anterolisthesis at C7-T1, facet mediated. IMPRESSION: No evidence of acute intracranial or cervical spine injury. Electronically Signed   By: Monte Fantasia M.D.   On: 12/21/2015 07:15   Mr Lumbar  Spine Wo Contrast  12/21/2015  CLINICAL DATA:   25 -year-old female with recurrent lumbar back pain after recent fall. Generalized weakness. Lower extremity muscle spasms. Initial encounter. EXAM: MRI LUMBAR SPINE WITHOUT CONTRAST TECHNIQUE: Multiplanar, multisequence MR imaging of the lumbar spine was performed. No intravenous contrast was administered. COMPARISON:  Lumbar radiographs 0045 hours today. Lumbar MRI for 117. FINDINGS: Study is intermittently degraded by motion artifact despite repeated imaging attempts. Segmentation: Normal as demonstrated on today radiographs common this is the same numbering system used on the prior MRI. Alignment: Stable since April. No compression fracture. No definite spondylolisthesis. Vertebrae: Postoperative changes to the bilateral posterior elements at L5-S1, see details below. No superimposed acute osseous abnormality. T1 bone marrow signal appears stable and within normal limits. Conus medullaris: Extends to the L1-L2 level and appears normal. No lower thoracic spinal cord signal abnormality. Paraspinal and other soft tissues: Stable and negative visualized abdominal viscera. Postoperative changes to the posterior paraspinal soft tissues at L4, L5, and S1. Subcutaneous edema, including at the skin incision site to the left of midline at L4-L5 (series 5, image 10). This fluid does not appear organized or drainable. Mild fluid or edema along the surgical approach is through the posterior elements at L5-S1, greater on the right (series 5, image 4). See additional details of that level below. Disc levels: The levels T10-T11 through L3-L4 appear stable and largely unremarkable for age. L4-L5: Stable mostly far lateral disc osteophyte complex. Stable posterior elements. No spinal, lateral recess, or significant foraminal stenosis. L5-S1: Postoperative changes tracking from the bilateral posterior elements to the bilateral lateral recesses best seen on series 6, images 34 and 35. Architectural distortion at the  lateral recesses, more so the right. Underlying disc space loss with bulky circumferential disc osteophyte complex and moderate bilateral facet hypertrophy. Mildly decreased thecal sac patency compared to the preoperative study (series 6, image 35). Previously-seen moderate to severe bilateral L5 foraminal stenosis does not appear significantly changed. IMPRESSION: 1. Postoperative changes bilaterally at L5-S1, greater on the right and with associated lateral recess architectural distortion. Mildly decreased thecal sac patency compared to the preoperative study in April. 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 2. Posterior paraspinal soft tissue edema without organized or drainable fluid collection. 3. Other lumbar levels are stable since April with no other lumbar spinal stenosis. Electronically Signed   By: Genevie Ann M.D.   On: 12/21/2015 10:40      Assessment/Plan Principal Problem:   Generalized weakness/ Lumbar back pain with radiculopathy affecting left lower extremity S/P lumbar discectomy -Patient with recent discectomy for significant sciatica to left leg resolved postoperatively now with acute onset of weakness with myoclonus type activity with exertion/postural changes-MRI of lumbar spine does not reveal any postoperative complications or issues with surgical procedure -Orthopedic evaluation pending-consultation called by EDP -Differential includes: Orthostasis versus myoclonus secondary to tramadol and Robaxin-patient has stage IV chronic kidney disease and this may be influencing excretion of medications -Generalized weakness could also be reflective of UTI so we'll check urinalysis and culture -Check orthostatic vital signs before initiating any IV fluids -Hold Robaxin and tramadol for now -We'll utilize low-dose OxyIR for now -PT/OT evaluation-patient and family adamant about not going to a skilled nursing facility even for rehabilitation but are willing to  return to CIR if recommended  Active Problems:   Essential hypertension -Currently not well controlled but this in setting of ongoing back pain -Home BP medications on hold until orthostatic vital signs completed -  Up note Norvasc recently discontinued secondary to edema -Patient on an alpha-blocker and if she truly is orthostatic this may need to be discontinued or dosage adjusted -Also on a beta blocker which typically does not exert the best antihypertensive control (see below regarding history of atrial fibrillation) -During previous hospitalization patient actually had issues with hypotension prompting temporal rarely discontinuation of antihypertensive agents and diuretics; these were slowly uptitrated to current home medications prior to discharge from rehabilitation  **Was not orthostatic and suspect patient's pain contributing to uncontrolled blood pressure. We'll resume preadmission beta blocker and Minipress and have added Imdur 30 mg daily    Acute hypokalemia -Likely secondary to utilization of Lasix without supplemental potassium-suspect given degree of chronic kidney disease patient was not initially prescribed potassium for fear of inducing hyperkalemia -Oral replete -Check electrolytes now along with phosphorus and magnesium -Electrolytes disturbances can also contribute to myoclonic activity with exertion    Diabetes mellitus, insulin dependent (IDDM), controlled  -Continue Lantus 20 units daily and Tradjenta -SSI -Hgb A1c 7.1 in May    Chronic kidney disease (CKD), stage V  -Current renal function: 40/2.15 which is stable and at baseline -Had vascular access placed in April 2017 -Preadmission Lasix on hold until orthostatics completed    Hx of gout -Patient has been reporting increasing left knee pain and erythema and they're suspicious she is having a gout flare -Check uric acid -Continue allopurinol for now but may need to hold if truly having gout flare -Prior  colchicine had been discontinued given renal function may need steroid burst instead    Hyperlipidemia -Continue Lipitor and aspirin (patient with apparent history of CVA)    Atrial fibrillation  -Not on anticoagulation -CHADVASc = 6 -May need to continue beta blocker for this reason    Anemia of chronic kidney failure -Hemoglobin slightly higher than baseline is between 8.4 and 9.5 which is suggestive of volume depletion -Continue to follow labs      DVT prophylaxis: Subcutaneous heparin Code Status: No intubation but otherwise full resuscitation  Family Communication: Daughter and husband at bedside  Disposition Plan: Pending PT/OT evaluation and once patient medically stable anticipate will return to previous home environment; if meets criteria family amenable to return to CIR Consults called: Orthopedics by EDP/[facility]  Admission status: Observation/telemetry    ELLIS,ALLISON L. ANP-BC [facility] Pager [phone]   If 7PM-7AM, please contact night-coverage [email or web address] Password [email or web address]  12/21/2015, 3:35 PM

## 2015-12-21 NOTE — ED Notes (Signed)
Per [facility]  Back surgery on the May 19th. Released the 2nd of June. Pt had an assisted fall and is having lower back pain while ambulating and standing. 0/10 while  sedentary.  A&O x4. No respiratory issues.  216/118 HTN L arm restricted. To start dialysis soon.   BPs R arm only. 209/100 205/89 90s HR 97-98% RA 14R-18R

## 2015-12-21 NOTE — ED Provider Notes (Addendum)
Discussed MRI findings with Dr. on call for Dr. Lorin Mercy.  Admit to general medicine.  Nat Christen, MD 12/21/15 [place], MD 01/11/16 1355

## 2015-12-21 NOTE — ED Notes (Signed)
Tried calling report to [facility]. Bed not assigned yet

## 2015-12-21 NOTE — Progress Notes (Signed)
BP high when in ED. ED nurse made dr aware of BP systolic in [id number]. ED nurse reported to this nurse that dr is aware and no new orders given. Rapid Response nurse aware. BP now183/67. Made Dr aware. Pt denies chest pain.

## 2015-12-22 ENCOUNTER — Inpatient Hospital Stay (HOSPITAL_COMMUNITY): Payer: Medicare Other

## 2015-12-22 ENCOUNTER — Encounter (HOSPITAL_COMMUNITY): Payer: Self-pay | Admitting: General Practice

## 2015-12-22 ENCOUNTER — Other Ambulatory Visit: Payer: Self-pay

## 2015-12-22 DIAGNOSIS — M79609 Pain in unspecified limb: Secondary | ICD-10-CM

## 2015-12-22 DIAGNOSIS — N184 Chronic kidney disease, stage 4 (severe): Secondary | ICD-10-CM

## 2015-12-22 DIAGNOSIS — G4752 REM sleep behavior disorder: Secondary | ICD-10-CM

## 2015-12-22 DIAGNOSIS — M79605 Pain in left leg: Secondary | ICD-10-CM

## 2015-12-22 DIAGNOSIS — G253 Myoclonus: Secondary | ICD-10-CM

## 2015-12-22 DIAGNOSIS — R29898 Other symptoms and signs involving the musculoskeletal system: Secondary | ICD-10-CM

## 2015-12-22 DIAGNOSIS — D631 Anemia in chronic kidney disease: Secondary | ICD-10-CM

## 2015-12-22 DIAGNOSIS — G250 Essential tremor: Secondary | ICD-10-CM

## 2015-12-22 LAB — CBC
HCT: 37.7 % (ref 36.0–46.0)
HEMOGLOBIN: 11.9 g/dL — AB (ref 12.0–15.0)
MCH: 31.4 pg (ref 26.0–34.0)
MCHC: 31.6 g/dL (ref 30.0–36.0)
MCV: 99.5 fL (ref 78.0–100.0)
Platelets: 191 10*3/uL (ref 150–400)
RBC: 3.79 MIL/uL — AB (ref 3.87–5.11)
RDW: 15.6 % — ABNORMAL HIGH (ref 11.5–15.5)
WBC: 13.6 10*3/uL — AB (ref 4.0–10.5)

## 2015-12-22 LAB — BASIC METABOLIC PANEL
ANION GAP: 11 (ref 5–15)
BUN: 38 mg/dL — ABNORMAL HIGH (ref 6–20)
CALCIUM: 8.7 mg/dL — AB (ref 8.9–10.3)
CO2: 25 mmol/L (ref 22–32)
Chloride: 107 mmol/L (ref 101–111)
Creatinine, Ser: 2.14 mg/dL — ABNORMAL HIGH (ref 0.44–1.00)
GFR, EST AFRICAN AMERICAN: 25 mL/min — AB (ref >60)
GFR, EST NON AFRICAN AMERICAN: 21 mL/min — AB (ref >60)
Glucose, Bld: 101 mg/dL — ABNORMAL HIGH (ref 65–99)
Potassium: 3.7 mmol/L (ref 3.5–5.1)
SODIUM: 143 mmol/L (ref 135–145)

## 2015-12-22 LAB — GLUCOSE, CAPILLARY
GLUCOSE-CAPILLARY: 106 mg/dL — AB (ref 65–99)
GLUCOSE-CAPILLARY: 117 mg/dL — AB (ref 65–99)
Glucose-Capillary: 94 mg/dL (ref 65–99)
Glucose-Capillary: 108 mg/dL — ABNORMAL HIGH (ref 65–99)
Glucose-Capillary: 161 mg/dL — ABNORMAL HIGH (ref 65–99)
Glucose-Capillary: 195 mg/dL — ABNORMAL HIGH (ref 65–99)

## 2015-12-22 LAB — AMMONIA: AMMONIA: 34 umol/L (ref 9–35)

## 2015-12-22 MED ORDER — ALPRAZOLAM 0.25 MG PO TABS
0.2500 mg | ORAL_TABLET | Freq: Every day | ORAL | Status: DC | PRN
  Administered 2015-12-22: 0.25 mg via ORAL
  Filled 2015-12-22 (×2): qty 1

## 2015-12-22 MED ORDER — HYDRALAZINE HCL 20 MG/ML IJ SOLN
5.0000 mg | Freq: Four times a day (QID) | INTRAMUSCULAR | Status: DC | PRN
  Filled 2015-12-22 (×2): qty 1

## 2015-12-22 NOTE — Progress Notes (Signed)
Subjective: Doing well. Continues to have some leg pain. States that she did not go down for venous and arterial dopplers last night as I ordered.     Objective: Vital signs in last 24 hours: Temp:  [98.2 F (36.8 C)-98.3 F (36.8 C)] 98.3 F (36.8 C) (06/09 0504) Pulse Rate:  [86-103] 88 (06/09 0819) Resp:  [16-20] 20 (06/09 0504) BP: (168-202)/(61-117) 172/80 mmHg (06/09 0819) SpO2:  [93 %-100 %] 93 % (06/09 0504) Weight:  [90.5 kg (199 lb 8.3 oz)-90.719 kg (200 lb)] 90.5 kg (199 lb 8.3 oz) (06/08 1725)  Intake/Output from previous day: 06/08 0701 - 06/09 0700 In: 227 [P.O.:227] Out: -  Intake/Output this shift: Total I/O In: -  Out: 300 [Urine:300]   Recent Labs  12/21/15 0445 12/22/15 0554  HGB 10.9* 11.9*    Recent Labs  12/21/15 0445 12/22/15 0554  WBC 8.8 13.6*  RBC 3.58* 3.79*  HCT 35.0* 37.7  PLT 189 191    Recent Labs  12/21/15 1803 12/22/15 0554  NA 141 143  K 5.1 3.7  CL 107 107  CO2 24 25  BUN 40* 38*  CREATININE 2.14* 2.14*  GLUCOSE 106* 101*  CALCIUM 8.5* 8.7*   No results for input(s): LABPT, INR in the last 72 hours.  Exam:  Pleasant elderly wf, alert and oriented, NAD.  Left knee, ankle, foot still tender.  No signs of infection. Difficult to palpate pedal pulses.  Left calf tender.  Trace left ant tib/gastroc weakness.  Skin warm and dry.   Xray:   Left knee mild djd with chondrocalcinosis.  Left ankle mild djd. Left foot some djd.   No acute findings.  Vascular calcifications seen left knee into foot.   Assessment/Plan: 2nd stat order placed for arterial/venous dopplers.  RN called and advised that these need to be done. Will review studies later today if performed. May get vascular surgery consult depending on the results.  Continue present care.    OWENS,JAMES M 12/22/2015, 8:53 AM

## 2015-12-22 NOTE — Progress Notes (Addendum)
Unable to complete orthostatic VS because patient stating "i can't stand my legs give out." RN attempted to comfort and explain we can try safely, but patient still refusing.   Supine BP 172/84 HR 89 Sitting BP 172/80 HR 88  Daily BP meds given

## 2015-12-22 NOTE — Progress Notes (Addendum)
Orthostatic VS:  Supine BP 181/75 HR 80 Sitting BP 180/71 HR 81 Standing 133/60 HR 83 Standing 3 minutes BP 135/71 HR 83  Patient denied feeling dizzy. Patient stated legs felt weak and she was shaking. Physical therapy at bedside working with patient. Patient denied pain in knees or ankles, only "soreness" of whole left leg. Patient stated she has fallen in the past and "blacked out" but never saw a doctor about it. MD paged to update.

## 2015-12-22 NOTE — Progress Notes (Signed)
PROGRESS NOTE    [name]  [id number] DOB: 1941/01/08 DOA: 12/20/2015 PCP: Henrine Screws, MD   Brief Narrative: [name] is a 75 y.o. female with a Past Medical History of anemiua, glaucoma, HTN, GERD, Afib, CAD, SOB, carotid stenosis, CVA, CKD, MI who presents with progressive weakness and odd tremor. She was found to have UTI and started on rocephin. Orthopedics consulted and recommendations given.   Assessment & Plan:   Principal Problem:   Generalized weakness Active Problems:   Essential hypertension   Hyperlipidemia   Atrial fibrillation (HCC)   Diabetes mellitus, insulin dependent (IDDM), controlled (HCC)   Chronic kidney disease (CKD), stage V (HCC)   S/P lumbar discectomy   Lumbar back pain with radiculopathy affecting left lower extremity   Anemia of chronic kidney failure   Hx of gout   Acute hypokalemia   Generalized weakness/ lumbar back pain with radiculopathy/ s/p lumbar discectomy: MRI does not reveal any post operative complications.  Orthopedics consulted and recommendations given.  X rays of the ankle and foot are negative.  Venous duplex negative for DVT Arterial duplex : Unable to calculate bilateral ABIs due to noncompressible vessels bilaterally. Noncompressible vessels are suggestive of possible medial calcification. The right TBI is within normal limits, borderline abnormal. The left TBI is abnormal  PT /OT eval ordered.   Hypertension: Sub optimal.  Resume home meds.    Hypokalemia: repleted as needed.   Diabetes mellitus: CBG (last 3)   Recent Labs  12/22/15 0751 12/22/15 1156 12/22/15 1657  GLUCAP 94 108* 161*    hgba1c is 7.1 in May.  Resume lantus and SSI.    STAGE V CKD; Stable and at baseline.  Lasix on hold as she is orthostatic, will start gentle hydration.     Possible Gout flare:currently on allopurinol.  Will start her on steroids.    Chronic atrial fibrillation: Resume bb in am.   Anemia of  chronic disease: Appears to be stable.   UTI: started on rocephin.    DVT prophylaxis: (Heparin/ Code Status: (/Partial - no intubation Family Communication: none at bedside.  Disposition Plan: pending further management and PT eval.    Consultants:   Orthopedics and vascular surgery  Physical therapy     Procedures:   venous duplex. No dvt.   Arterial duplex.  :Unable to calculate bilateral ABIs due to noncompressible vessels bilaterally. Noncompressible vessels are suggestive of possible medial calcification. The right TBI is within normal limits, borderline abnormal. The left TBI is abnormal  S/p L5-S1 decompression/microdiscectomy 01 Dec 2015   xray osf the knee ankle and foot.    Antimicrobials: rocephin 6/8   Subjective: Reports being scared of falling, very anxious but pleasant.   Objective: Filed Vitals:   12/22/15 1500 12/22/15 1502 12/22/15 1511 12/22/15 1512  BP: 181/74 180/71 133/60 135/71  Pulse: 86 81 83 83  Temp:      TempSrc:      Resp:      Height:      Weight:      SpO2:        Intake/Output Summary (Last 24 hours) at 12/22/15 1635 Last data filed at 12/22/15 1353  Gross per 24 hour  Intake    347 ml  Output    650 ml  Net   -303 ml   Filed Weights   12/21/15 0003 12/21/15 1725  Weight: 90.719 kg (200 lb) 90.5 kg (199 lb 8.3 oz)    Examination:  General  exam: Appears calm and comfortable  Respiratory system: Clear to auscultation. Respiratory effort normal. Cardiovascular system: S1 & S2 heard, RRR. No JVD, murmurs, rubs, gallops or clicks. No pedal edema. Gastrointestinal system: Abdomen is nondistended, soft and nontender. No organomegaly or masses felt. Normal bowel sounds heard. Central nervous system: Alert and oriented. No new focal neurological deficits. Extremities: no pedale dema.  Skin: No rashes, lesions or ulcers     Data Reviewed: I have personally reviewed following labs and imaging studies  CBC:  Recent  Labs Lab 12/21/15 0445 12/22/15 0554  WBC 8.8 13.6*  NEUTROABS 6.3  --   HGB 10.9* 11.9*  HCT 35.0* 37.7  MCV 97.8 99.5  PLT 189 [id number]   Basic Metabolic Panel:  Recent Labs Lab 12/21/15 0445 12/21/15 1803 12/22/15 0554  NA 142 141 143  K 3.0* 5.1 3.7  CL 105 107 107  CO2 27 24 25   GLUCOSE 125* 106* 101*  BUN 40* 40* 38*  CREATININE 2.15* 2.14* 2.14*  CALCIUM 8.6* 8.5* 8.7*  MG  --  1.3*  --   PHOS  --  3.3  --    GFR: Estimated Creatinine Clearance: 24.6 mL/min (by C-G formula based on Cr of 2.14). Liver Function Tests: No results for input(s): AST, ALT, ALKPHOS, BILITOT, PROT, ALBUMIN in the last 168 hours. No results for input(s): LIPASE, AMYLASE in the last 168 hours. No results for input(s): AMMONIA in the last 168 hours. Coagulation Profile: No results for input(s): INR, PROTIME in the last 168 hours. Cardiac Enzymes: No results for input(s): CKTOTAL, CKMB, CKMBINDEX, TROPONINI in the last 168 hours. BNP (last 3 results) No results for input(s): PROBNP in the last 8760 hours. HbA1C: No results for input(s): HGBA1C in the last 72 hours. CBG:  Recent Labs Lab 12/21/15 2013 12/22/15 0017 12/22/15 0438 12/22/15 0751 12/22/15 1156  GLUCAP 147* 117* 106* 94 108*   Lipid Profile: No results for input(s): CHOL, HDL, LDLCALC, TRIG, CHOLHDL, LDLDIRECT in the last 72 hours. Thyroid Function Tests: No results for input(s): TSH, T4TOTAL, FREET4, T3FREE, THYROIDAB in the last 72 hours. Anemia Panel: No results for input(s): VITAMINB12, FOLATE, FERRITIN, TIBC, IRON, RETICCTPCT in the last 72 hours. Sepsis Labs:  Recent Labs Lab 12/21/15 1756  PROCALCITON 0.49    No results found for this or any previous visit (from the past 240 hour(s)).       Radiology Studies: Dg Thoracic Spine W/swimmers  12/21/2015  CLINICAL DATA:  Started to fall.  Back pain.  Initial encounter. EXAM: THORACIC SPINE - 3 VIEWS COMPARISON:  Two-view chest x-ray 12/04/2015 FINDINGS:  There is no evidence of thoracic spine fracture. Mildly exaggerated kyphosis with diffuse degenerative disc narrowing and endplate spurring. Osteopenia. IMPRESSION: No acute finding. Electronically Signed   By: Monte Fantasia M.D.   On: 12/21/2015 01:42   Dg Lumbar Spine 2-3 Views  12/21/2015  CLINICAL DATA:  Started to fall.  Low back pain.  Initial encounter. EXAM: LUMBAR SPINE - 2-3 VIEW COMPARISON:  12/01/2015 FINDINGS: Normal lumbar segmentation. No convincing fracture. Differential appearance of the superior endplate of L5 is likely related to obliquity of imaging. No traumatic malalignment. Multilevel spondylosis and disc degeneration with narrowing and spurring greatest at L2-3. Extensive atherosclerotic calcification. Osteopenia. IMPRESSION: No convincing fracture. Electronically Signed   By: Monte Fantasia M.D.   On: 12/21/2015 01:41   Dg Knee 1-2 Views Left  12/21/2015  CLINICAL DATA:  Fall, left knee swelling EXAM: LEFT KNEE - 1-2 VIEW COMPARISON:  None. FINDINGS: No fracture or dislocation is seen. Mild tricompartmental degenerative changes with chondrocalcinosis in the medial and lateral compartments. Visualized soft tissues are within normal limits, noting vascular calcifications. No suprapatellar knee joint effusion. IMPRESSION: No fracture or dislocation is seen. Mild degenerative changes with chondrocalcinosis. Electronically Signed   By: Julian Hy M.D.   On: 12/21/2015 21:20   Dg Ankle 2 Views Left  12/21/2015  CLINICAL DATA:  Fall, ankle swelling EXAM: LEFT ANKLE - 2 VIEW COMPARISON:  None. FINDINGS: Evaluation is mildly constrained by obliquity. No fracture or dislocation is seen. The ankle mortise is intact. Possible mild medial soft tissue swelling. Vascular calcifications. IMPRESSION: No fracture or dislocation is seen. Electronically Signed   By: Julian Hy M.D.   On: 12/21/2015 21:22   Ct Head Wo Contrast  12/21/2015  CLINICAL DATA:  Fall with occipital scalp  injury. Initial encounter. EXAM: CT HEAD WITHOUT CONTRAST CT CERVICAL SPINE WITHOUT CONTRAST TECHNIQUE: Multidetector CT imaging of the head and cervical spine was performed following the standard protocol without intravenous contrast. Multiplanar CT image reconstructions of the cervical spine were also generated. COMPARISON:  None. FINDINGS: CT HEAD FINDINGS Skull and Sinuses:Negative for fracture or destructive process. The visualized mastoids, middle ears, and imaged paranasal sinuses are clear. Visualized orbits: Negative. Brain: No evidence of acute infarction, hemorrhage, hydrocephalus, or mass lesion/mass effect. Generalized atrophy, mild to moderate for age. CT CERVICAL SPINE FINDINGS Negative for acute fracture or subluxation. No prevertebral edema. No gross cervical canal hematoma. Advanced and diffuse disc and facet degeneration. C4-5 ACDF or ankylosis. Posterior endplate ridging narrows the spinal canal at multiple levels. Slight anterolisthesis at C7-T1, facet mediated. IMPRESSION: No evidence of acute intracranial or cervical spine injury. Electronically Signed   By: Monte Fantasia M.D.   On: 12/21/2015 07:15   Ct Cervical Spine Wo Contrast  12/21/2015  CLINICAL DATA:  Fall with occipital scalp injury. Initial encounter. EXAM: CT HEAD WITHOUT CONTRAST CT CERVICAL SPINE WITHOUT CONTRAST TECHNIQUE: Multidetector CT imaging of the head and cervical spine was performed following the standard protocol without intravenous contrast. Multiplanar CT image reconstructions of the cervical spine were also generated. COMPARISON:  None. FINDINGS: CT HEAD FINDINGS Skull and Sinuses:Negative for fracture or destructive process. The visualized mastoids, middle ears, and imaged paranasal sinuses are clear. Visualized orbits: Negative. Brain: No evidence of acute infarction, hemorrhage, hydrocephalus, or mass lesion/mass effect. Generalized atrophy, mild to moderate for age. CT CERVICAL SPINE FINDINGS Negative for  acute fracture or subluxation. No prevertebral edema. No gross cervical canal hematoma. Advanced and diffuse disc and facet degeneration. C4-5 ACDF or ankylosis. Posterior endplate ridging narrows the spinal canal at multiple levels. Slight anterolisthesis at C7-T1, facet mediated. IMPRESSION: No evidence of acute intracranial or cervical spine injury. Electronically Signed   By: Monte Fantasia M.D.   On: 12/21/2015 07:15   Mr Lumbar Spine Wo Contrast  12/21/2015  CLINICAL DATA:  89 -year-old female with recurrent lumbar back pain after recent fall. Generalized weakness. Lower extremity muscle spasms. Initial encounter. EXAM: MRI LUMBAR SPINE WITHOUT CONTRAST TECHNIQUE: Multiplanar, multisequence MR imaging of the lumbar spine was performed. No intravenous contrast was administered. COMPARISON:  Lumbar radiographs 0045 hours today. Lumbar MRI for 117. FINDINGS: Study is intermittently degraded by motion artifact despite repeated imaging attempts. Segmentation: Normal as demonstrated on today radiographs common this is the same numbering system used on the prior MRI. Alignment: Stable since April. No compression fracture. No definite spondylolisthesis. Vertebrae: Postoperative changes to the bilateral  posterior elements at L5-S1, see details below. No superimposed acute osseous abnormality. T1 bone marrow signal appears stable and within normal limits. Conus medullaris: Extends to the L1-L2 level and appears normal. No lower thoracic spinal cord signal abnormality. Paraspinal and other soft tissues: Stable and negative visualized abdominal viscera. Postoperative changes to the posterior paraspinal soft tissues at L4, L5, and S1. Subcutaneous edema, including at the skin incision site to the left of midline at L4-L5 (series 5, image 10). This fluid does not appear organized or drainable. Mild fluid or edema along the surgical approach is through the posterior elements at L5-S1, greater on the right (series  5, image 4). See additional details of that level below. Disc levels: The levels T10-T11 through L3-L4 appear stable and largely unremarkable for age. L4-L5: Stable mostly far lateral disc osteophyte complex. Stable posterior elements. No spinal, lateral recess, or significant foraminal stenosis. L5-S1: Postoperative changes tracking from the bilateral posterior elements to the bilateral lateral recesses best seen on series 6, images 34 and 35. Architectural distortion at the lateral recesses, more so the right. Underlying disc space loss with bulky circumferential disc osteophyte complex and moderate bilateral facet hypertrophy. Mildly decreased thecal sac patency compared to the preoperative study (series 6, image 35). Previously-seen moderate to severe bilateral L5 foraminal stenosis does not appear significantly changed. IMPRESSION: 1. Postoperative changes bilaterally at L5-S1, greater on the right and with associated lateral recess architectural distortion. Mildly decreased thecal sac patency compared to the preoperative study in April. 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 2. Posterior paraspinal soft tissue edema without organized or drainable fluid collection. 3. Other lumbar levels are stable since April with no other lumbar spinal stenosis. Electronically Signed   By: Genevie Ann M.D.   On: 12/21/2015 10:40   Dg Foot Complete Left  12/21/2015  CLINICAL DATA:  Fall, ankle swelling EXAM: LEFT FOOT - COMPLETE 3+ VIEW COMPARISON:  None. FINDINGS: No fracture or dislocation is seen. Mild degenerative changes at the MTP joint. Mild soft tissue swelling along the dorsal forefoot. Small plantar and posterior calcaneal enthesophytes. Vascular calcifications. IMPRESSION: No fracture or dislocation is seen. Electronically Signed   By: Julian Hy M.D.   On: 12/21/2015 21:21        Scheduled Meds: . allopurinol  100 mg Oral BID  . aspirin  325 mg Oral Daily  .  atorvastatin  10 mg Oral q1800  . brimonidine  1 drop Left Eye BID  . calcitRIOL  0.25 mcg Oral QODAY  . cefTRIAXone (ROCEPHIN)  IV  1 g Intravenous Q24H  . cholecalciferol  2,000 Units Oral BID  . citalopram  20 mg Oral Daily  . diclofenac sodium  2 g Topical QID  . furosemide  80 mg Oral Q breakfast  . heparin  5,000 Units Subcutaneous Q8H  . insulin aspart  0-5 Units Subcutaneous QHS  . insulin aspart  0-9 Units Subcutaneous TID WC  . insulin glargine  20 Units Subcutaneous QHS  . isosorbide mononitrate  30 mg Oral Daily  . linagliptin  5 mg Oral Q breakfast  . metoprolol  100 mg Oral BID  . MUSCLE RUB  1 application Topical BID  . omega-3 acid ethyl esters  2 g Oral Daily  . pantoprazole  40 mg Oral Daily  . prazosin  1 mg Oral QHS  . sodium chloride flush  3 mL Intravenous Q12H  . timolol  1 drop Left Eye Daily  . Travoprost (BAK  Free)  1 drop Left Eye QHS  . vitamin B-12  1,000 mcg Oral Daily   Continuous Infusions:    LOS: 1 day    Time spent: 40 minutes.    Hosie Poisson, MD [facility] Pager [phone]   If 7PM-7AM, please contact night-coverage [email or web address] Password [facility] 12/22/2015, 4:35 PM

## 2015-12-22 NOTE — Consult Note (Signed)
   [facility] CM Inpatient Consult   12/22/2015  MYSTICAL STORZ 02-07-1941 [id number]  Patient evaluated for community based chronic disease management services with [place] [facility] as a benefit of patient's Medicare Insurance.  This is the patient's 4th admission in the past 6 months.  Spoke with patient, husband and granddaughter  at bedside to explain [place] [facility] services.This  75 y.o. female admitted to [facility] on 12/20/15 for weakness. Pt fell at home assisted to the floor by her granddaughter. MRI of spine indicated no abnormalities at the low back.  Pt with significant PMH  of a-fib, CAD, CEA, HOH, stroke, CKD, DM, MI, multiple falls, neck surgery, CABG, L knee surgery, and lumbar laminectomy 12/01/15.  Consent form signed.   Patient will receive post hospital discharge call and will be evaluated for monthly home visits for assessments and disease process education. Patient is active with [facility] prior to admission.    Left contact information and [facility] literature at bedside. Made Inpatient Case Manager aware that [place] Management following. Of note, [facility] Care Management services does not replace or interfere with any services that are arranged by inpatient case management or social work.  For additional questions or referrals please contact:    Natividad Brood, RN BSN [facility] Liaison  [phone] business mobile phone Toll free office [phone]

## 2015-12-22 NOTE — Progress Notes (Signed)
*  PRELIMINARY RESULTS* Vascular Ultrasound  Left lower extremity venous duplex completed. Left lower extremity is negative for deep vein thrombosis. There is no evidence of left Baker's cyst.  Left Lower Extremity Arterial Duplex has been completed. There is no obvious evidence of hemodynamically significant stenosis involving the visualized arteries of the left lower extremity.  ABI completed:  RIGHT    LEFT    PRESSURE WAVEFORM  PRESSURE WAVEFORM  BRACHIAL 210 Triphasic BRACHIAL  AVF  DP Noncompressible Biphasic DP Noncompressible Monophasic  AT   AT    PT Noncompressible Biphasic PT Noncompressible Triphasic  PER   PER    GREAT TOE 151 NA GREAT TOE 78 NA    RIGHT LEFT  TBI 0.72 0.37   Unable to calculate bilateral ABIs due to noncompressible vessels bilaterally. Noncompressible vessels are suggestive of possible medial calcification. The right TBI is within normal limits, borderline abnormal. The left TBI is abnormal.  12/22/2015 10:02 AM Maudry Mayhew, RVT, RDCS, RDMS

## 2015-12-22 NOTE — Progress Notes (Signed)
Patient ID: [name], female   DOB: 1941-04-19, 75 y.o.   MRN: [id number]   I did review venous and arterial doppler studies.  Venous negative for DVT. Arterial report read Unable to calculate bilateral ABIs due to noncompressible vessels bilaterally. Noncompressible vessels are suggestive of possible medial calcification. The right TBI is within normal limits, borderline abnormal. The left TBI is abnormal. [name] is an established patient with Dr [name] vascular surgeon and I did contact his office for a consult to get their input on patient's current left LE problem.

## 2015-12-22 NOTE — Evaluation (Signed)
Physical Therapy Evaluation Patient Details Name: [name] MRN: [id number] DOB: 06-Oct-1940 Today's Date: 12/22/2015   History of Present Illness  75 y.o. female admitted to [facility] on 12/20/15 for weakness.  Pt fell at home assisted to the floor by her granddaughter.  MRI of spine indicated no abnormalities at the low back.  Vascular surgery consulted to r/o claudication.  Medical workup in progress.  Pt with significant PMhx of a-fib, CAD, CEA, HOH, stroke, CKD, DM, MI, multiple falls, neck surgery, CABG, L knee surgery, and lumbar laminectomy 12/01/15.    Clinical Impression  Pt is very anxious and I believe this is affecting her ability to mobilize safely.  She is weaker on her left leg, but with h/o knee surgery and PMHx of nerve pain prior to back surgery here, I am not much surprised that it is weaker.  It did not buckle during standing and pivoting (except when pt became fearful and then she started to tremor in both legs and both arms).  PT will follow acutely and is hopeful for pt to be able to progress to where she can go home with family's support.  They were there 24/7 before with her.  She was active with home therapy.     Follow Up Recommendations Home health PT;Supervision/Assistance - 24 hour    Equipment Recommendations  None recommended by PT    Recommendations for Other Services   NA    Precautions / Restrictions Precautions Precautions: Back;Fall Precaution Comments: pt is very fearful of falling      Mobility  Bed Mobility Overal bed mobility: Needs Assistance Bed Mobility: Supine to Sit Rolling: Supervision   Supine to sit: Supervision     General bed mobility comments: Supervision for safety pt using bed rail to pull to sitting  Transfers Overall transfer level: Needs assistance Equipment used: Rolling walker (2 wheeled) Transfers: Sit to/from Stand Sit to Stand: +2 safety/equipment;Min assist Stand pivot transfers: +2 physical assistance;Min assist        General transfer comment: Two person min assist for first stand and first stand and pivot to [facility]. We were able to stand pivot back to bed and then to the chair with one person assist with repeated transfers.   Ambulation/Gait             General Gait Details: pt too nervous to try today.  Will do tomorrow with someone there to follow with recliner chair.          Balance Overall balance assessment: Needs assistance Sitting-balance support: Feet supported;No upper extremity supported Sitting balance-Leahy Scale: Good     Standing balance support: Bilateral upper extremity supported Standing balance-Leahy Scale: Poor                               Pertinent Vitals/Pain Pain Assessment: Faces Faces Pain Scale: Hurts little more Pain Location: left leg is sore Pain Descriptors / Indicators: Sore Pain Intervention(s): Limited activity within patient's tolerance;Monitored during session;Repositioned    Home Living Family/patient expects to be discharged to:: Private residence Living Arrangements: Spouse/significant other Available Help at Discharge: Family;Available 24 hours/day Type of Home: House Home Access: Level entry     Home Layout: Two level;Able to live on main level with bedroom/bathroom Home Equipment: Walker - 4 wheels;Walker - 2 wheels;Cane - single point;Bedside commode;Grab bars - toilet;Transport chair;Shower seat      Prior Function Level of Independence: Needs assistance  Gait / Transfers Assistance Needed: Pt was supervision for household gait with RW.  Family has been assisting since home from inpatient rehab.  Pt also active with HHPT.            Hand Dominance   Dominant Hand: Right    Extremity/Trunk Assessment   Upper Extremity Assessment: Defer to OT evaluation           Lower Extremity Assessment: LLE deficits/detail   LLE Deficits / Details: left leg with weakness throughout compared to right leg.  Both legs  with peripherial neuropathy, so no difference in sensation.  No buckling in standing unless pt got nervous and then she would start these jerking movements and buckle bil legs. #=-4/5  Cervical / Trunk Assessment: Other exceptions  Communication   Communication: HOH (uses hearing aids)  Cognition Arousal/Alertness: Awake/alert Behavior During Therapy: Anxious (very anxious, fearful of falling) Overall Cognitive Status: Within Functional Limits for tasks assessed                      General Comments General comments (skin integrity, edema, etc.): Pt with jerky tremor in sitting in bil upper extremities and lower extremities, none at rest in the bed.  The tremor seems to show up the more nervous and fearful she got.            Assessment/Plan    PT Assessment Patient needs continued PT services  PT Diagnosis Difficulty walking;Abnormality of gait;Generalized weakness   PT Problem List Decreased strength;Decreased activity tolerance;Decreased balance;Decreased mobility;Decreased coordination;Decreased knowledge of use of DME;Pain  PT Treatment Interventions DME instruction;Gait training;Functional mobility training;Therapeutic activities;Therapeutic exercise;Balance training;Neuromuscular re-education;Patient/family education;Modalities   PT Goals (Current goals can be found in the Care Plan section) Acute Rehab PT Goals Patient Stated Goal: to go home PT Goal Formulation: With patient/family Time For Goal Achievement: 01/05/16 Potential to Achieve Goals: Good    Frequency Min 3X/week           End of Session Equipment Utilized During Treatment: Gait belt Activity Tolerance: Other (comment) (limited by fear) Patient left: in chair;with call bell/phone within reach;with chair alarm set;with family/visitor present Nurse Communication: Mobility status         Time: 1456-1531 PT Time Calculation (min) (ACUTE ONLY): 35 min   Charges:   PT Evaluation $PT Eval  Moderate Complexity: 1 Procedure PT Treatments $Therapeutic Activity: 8-22 mins        Kellan Raffield B. [place], [place], DPT [phone]   12/22/2015, 3:48 PM

## 2015-12-22 NOTE — Consult Note (Signed)
Hospital Consult    Reason for Consult:  Left leg/hip pain Referring Physician:  Dr. Ricard Dillon MRN #:  [id number]  History of Present Illness: This is a 75 y.o. female who states that she had back surgery back in May.  She was recently released from [facility] on 12/15/15.  She presented to the orthopedic surgeon yesterday where she ambulated a short distance and then got weak and could not stand anymore and was helped to the floor.  She was admitted for further work up.  She states that before she was having any pain before having surgery, she could be out with her daughters shopping and after walking, she would get some cramping in her calf (left > right) and have to sit down and it would improve and she would continue. She denies any non healing wounds.  She does have a sore on her right 3rd toe, which she states is from the TED hose she had to wear with her surgery, but this is healing.  She has been having pain in the left hip and left leg, which radiates from her hip down her leg, which has worsened over the past week.  She did have an MRI that did not reveal any infectious process or abnormality at the surgical site.   She is followed by Dr. Scot Dock yearly and was last seen in April for carotid artery stenosis bilaterally and has had bilateral CEA.  She is set to f/u with Dr. Scot Dock in May 2018.  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 Scot Dock.  She is not yet on HD.    She does have DM and does not have feeling in her feet.  Her husband says that when she was able to walk and wore bedroom shoes, she could walk right out of them and not know it.  She is on insulin.    She is on a statin for cholesterol management.  She is on a beta blocker for hypertension.  She takes a daily aspirin.  Past Medical History  Diagnosis Date  . Anemia   . Glaucoma   . Hypertension   . Blood transfusion   . GERD (gastroesophageal  reflux disease)   . Arthritis   . Neuromuscular disorder ([place])     CARPEL TUNNEL  . Atrial fibrillation ([place])   . CAD (coronary artery disease)   . Carotid artery occlusion     Carotid Endartectom,y - left 2009.  Blockage Right being watched by Dr Scot Dock.  Marland Kitchen HOH (hard of hearing)   . Shortness of breath   . Depression   . History of kidney stones     passed  . Carotid stenosis   . Complication of anesthesia     pt. states that she was difficult to wake  . Stroke ([place])     hx of TIA  . 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 Cancer ([place])     .  top of head- melonoma  . Dysrhythmia   . History of pneumonia   . Diabetes mellitus without complication ([place])   . Myocardial infarction ([place])   . History of hiatal hernia   . Pneumonia     Past Surgical History  Procedure Laterality Date  . Carpel tunnel    . Neck surgery    . Esophagogastroduodenoscopy  07/25/2011  Procedure: ESOPHAGOGASTRODUODENOSCOPY (EGD);  Surgeon: Winfield Cunas., MD;  Location: [facility] ENDOSCOPY;  Service: Endoscopy;  Laterality: N/A;  . Colonoscopy  07/25/2011    Procedure: COLONOSCOPY;  Surgeon: Winfield Cunas., MD;  Location: [facility] ENDOSCOPY;  Service: Endoscopy;  Laterality: N/A;  . Coronary artery bypass graft  07/29/2011    Procedure: CORONARY ARTERY BYPASS GRAFTING (CABG);  Surgeon: Gaye Pollack, MD;  Location: [place];  Service: Open Heart Surgery;  Laterality: N/A;  . Carotid endarterectomy Left 2009     CEA  . Av fistula placement Left 05/31/2014    Procedure: Creation of Left Arm arteriovenous brachiocephalic Fistula;  Surgeon: Angelia Mould, MD;  Location: [place];  Service: Vascular;  Laterality: Left;  . Fistulogram Left 08/29/2014    Procedure: FISTULOGRAM;  Surgeon: Angelia Mould, MD;  Location: [facility] CATH LAB;  Service: Cardiovascular;  Laterality: Left;  . Av fistula placement Left 09/01/2014    Procedure: INSERTION OF ARTERIOVENOUS (AV) GORE-TEX GRAFT LEFT UPPER  ARM USING  4-7 MM X 45 CM SRTETCH GORETEX GRAFT;  Surgeon: Angelia Mould, MD;  Location: [place];  Service: Vascular;  Laterality: Left;  . Cardiac catheterization    . Tonsillectomy    . Eye surgery    . Endarterectomy Right 04/21/2015    Procedure: ENDARTERECTOMY CAROTID;  Surgeon: Angelia Mould, MD;  Location: [place];  Service: Vascular;  Laterality: Right;  . Knee surgery Left   . Lumbar laminectomy/decompression microdiscectomy N/A 12/01/2015    Procedure: L5-S1 Decompression and Bilateral Microdiscectomy;  Surgeon: Marybelle Killings, MD;  Location: [place];  Service: Orthopedics;  Laterality: N/A;    Allergies  Allergen Reactions  . Gabapentin Other (See Comments)    hallucinations  . Other     Other reaction(s): Fatigue, Gout, Hallucintions  . Sulfa Antibiotics     Other reaction(s): psychiatric disturbances  . Amlodipine Other (See Comments)    edema    Prior to Admission medications   Medication Sig Start Date End Date Taking? Authorizing Provider  acetaminophen (TYLENOL) 325 MG tablet Take 2 tablets (650 mg total) by mouth every 6 (six) hours as needed for mild pain. 12/15/15   Bary Leriche, PA-C  allopurinol (ZYLOPRIM) 100 MG tablet Take 100 mg by mouth 2 (two) times daily.     Historical Provider, MD  aspirin 325 MG tablet Take 325 mg by mouth daily.    Historical Provider, MD  atorvastatin (LIPITOR) 10 MG tablet TAKE ONE TABLET BY MOUTH ONE TIME DAILY Patient taking differently: Take 10 mg by mouth daily.  02/21/14   Jettie Booze, MD  B Complex Vitamins (VITAMIN B COMPLEX PO) Take 1 tablet by mouth daily.    Historical Provider, MD  brimonidine (ALPHAGAN P) 0.1 % SOLN Place 1 drop into the left eye 2 (two) times daily.    Historical Provider, MD  calcitRIOL (ROCALTROL) 0.25 MCG capsule Take 0.25 mcg by mouth every other day.    Historical Provider, MD  Cholecalciferol (VITAMIN D) 2000 UNITS tablet Take 2,000 Units by mouth 2 (two) times daily.    Historical  Provider, MD  citalopram (CELEXA) 20 MG tablet Take 20 mg by mouth daily.    Historical Provider, MD  Cyanocobalamin (VITAMIN B 12 PO) Take 1,000 mg by mouth.    Historical Provider, MD  diclofenac sodium (VOLTAREN) 1 % GEL Apply 2 g topically 4 (four) times daily. 12/15/15   Bary Leriche, PA-C  fish oil-omega-3 fatty  acids 1000 MG capsule Take 2 g by mouth daily.    Historical Provider, MD  furosemide (LASIX) 80 MG tablet Take 1 tablet (80 mg total) by mouth daily. 12/15/15   Ivan Anchors Love, PA-C  HUMALOG 100 UNIT/ML injection Inject 0-12 Units into the skin 3 (three) times daily with meals. Sliding scale 04/17/14   Historical Provider, MD  insulin glargine (LANTUS) 100 UNIT/ML injection Inject 0.2-0.3 mLs (20-30 Units total) into the skin at bedtime. 12/15/15   Bary Leriche, PA-C  linagliptin (TRADJENTA) 5 MG TABS tablet Take 1 tablet (5 mg total) by mouth daily with breakfast. 08/02/11   Donielle Liston Alba, PA-C  Menthol-Methyl Salicylate (MUSCLE RUB) 10-15 % CREA Apply 1 application topically 2 (two) times daily. 12/15/15   Bary Leriche, PA-C  methocarbamol (ROBAXIN) 500 MG tablet Take 1 tablet (500 mg total) by mouth 4 (four) times daily. Wean to one pill four times a day as needed after a week. 12/15/15   Bary Leriche, PA-C  metoprolol (LOPRESSOR) 100 MG tablet Take 1 tablet (100 mg total) by mouth 2 (two) times daily. 12/15/15   Bary Leriche, PA-C  omeprazole (PRILOSEC) 20 MG capsule Take 20 mg by mouth daily as needed. Acid reflux    Historical Provider, MD  prazosin (MINIPRESS) 1 MG capsule Take 1 capsule (1 mg total) by mouth at bedtime. 12/15/15   Ivan Anchors Love, PA-C  timolol (TIMOPTIC) 0.5 % ophthalmic solution Place 1 drop into the left eye daily.    Historical Provider, MD  traMADol (ULTRAM) 50 MG tablet Take 1 tablet (50 mg total) by mouth 4 (four) times daily - after meals and at bedtime. 12/15/15   Ivan Anchors Love, PA-C  travoprost, benzalkonium, (TRAVATAN) 0.004 % ophthalmic solution Place 1 drop  into the left eye at bedtime.    Historical Provider, MD    Social History   Social History  . Marital Status: Married    Spouse Name: N/A  . Number of Children: N/A  . Years of Education: N/A   Occupational History  . Not on file.   Social History Main Topics  . Smoking status: Never Smoker   . Smokeless tobacco: Never Used  . Alcohol Use: No  . Drug Use: No  . Sexual Activity: No   Other Topics Concern  . Not on file   Social History Narrative     Family History  Problem Relation Age of Onset  . Diabetes Mother   . Hypertension Mother   . Heart disease Mother     beofre age 76  . Heart attack Mother   . Stroke Mother   . Cancer Father   . Hyperlipidemia Father   . Hypertension Father   . Deep vein thrombosis Daughter   . Diabetes Daughter   . Hyperlipidemia Daughter   . Hypertension Daughter   . Heart disease Daughter   . Peripheral vascular disease Daughter     ROS: [x]  Positive   [ ]  Negative   [ ]  All sytems reviewed and are negative  Cardiovascular: []  chest pain/pressure []  palpitations [x]  hx afib  [x]  carotid disease []  SOB lying flat []  DOE []  pain in legs while walking [x]  pain in left leg/hip at rest (radiating from hip down) []  pain in legs at night []  non-healing ulcers []  hx of DVT []  swelling in legs  Pulmonary: []  productive cough []  asthma/wheezing []  home O2  Neurologic: []  weakness in []  arms []  legs []   numbness in []  arms []  legs []  hx of CVA [x]  mini stroke [] difficulty speaking or slurred speech []  temporary loss of vision in one eye []  dizziness  HENT: [x]  HOH--wears hearing aids  Hematologic: [x]  hx of cancer-melanoma []  bleeding problems []  problems with blood clotting easily  Endocrine:   [x]  diabetes []  thyroid disease  GI []  vomiting blood []  blood in stool [x]  hiatal hernia (GERD)  GU: [x]  CKD/renal failure []  HD--[]  M/W/F or []  T/T/S []  burning with urination []  blood in  urine  Psychiatric: []  anxiety [x]  depression  Musculoskeletal: []  arthritis []  joint pain  Integumentary: []  rashes []  ulcers  Constitutional: []  fever []  chills   Physical Examination  Filed Vitals:   12/22/15 0817 12/22/15 0819  BP: 172/84 172/80  Pulse: 89 88  Temp:    Resp:     Body mass index is 33.73 kg/(m^2).  General:  WDWN in NAD Gait: Not observed HENT: WNL, normocephalic; hearing aids in place Pulmonary: normal non-labored breathing, without Rales, rhonchi,  wheezing Cardiac: regular, without  Murmurs, rubs or gallops; without carotid bruits Abdomen:  soft, NT/ND, no masses Skin: without rashes Vascular Exam/Pulses:  Right Left  Radial 2+ (normal) 2+ (normal)  Ulnar 1+ (weak) 1+ (weak)  Femoral 2+ (normal) 2+ (normal)  Popliteal Unable to palpate  Unable to palpate   DP Biphasic doppler signal Biphasic doppler signal  PT Biphasic doppler signal Biphasic doppler signal (1+ palpable)  Peroneal  Biphasic doppler signal Biphasic doppler signal   Extremities: without ischemic changes, without Gangrene , without cellulitis; without open wounds; small healing sore on right 3rd toe; LUA AVG is pulsatile with +bruit present.   Musculoskeletal: no muscle wasting or atrophy  Neurologic: A&O X 3; sensory in both feet is absent Psychiatric:  Normal affect Lymph:  No inguinal lymphadenopathy   CBC    Component Value Date/Time   WBC 13.6* 12/22/2015 0554   RBC 3.79* 12/22/2015 0554   HGB 11.9* 12/22/2015 0554   HCT 37.7 12/22/2015 0554   PLT 191 12/22/2015 0554   MCV 99.5 12/22/2015 0554   MCH 31.4 12/22/2015 0554   MCHC 31.6 12/22/2015 0554   RDW 15.6* 12/22/2015 0554   LYMPHSABS 1.7 12/21/2015 0445   MONOABS 0.7 12/21/2015 0445   EOSABS 0.1 12/21/2015 0445   BASOSABS 0.0 12/21/2015 0445    BMET    Component Value Date/Time   NA 143 12/22/2015 0554   K 3.7 12/22/2015 0554   CL 107 12/22/2015 0554   CO2 25 12/22/2015 0554   GLUCOSE 101*  12/22/2015 0554   BUN 38* 12/22/2015 0554   CREATININE 2.14* 12/22/2015 0554   CALCIUM 8.7* 12/22/2015 0554   GFRNONAA 21* 12/22/2015 0554   GFRAA 25* 12/22/2015 0554    COAGS: Lab Results  Component Value Date   INR 1.02 11/23/2015   INR 1.01 07/13/2015   INR 1.00 04/19/2015     Non-Invasive Vascular Imaging:   ABI completed:  RIGHT    LEFT    PRESSURE WAVEFORM  PRESSURE WAVEFORM  BRACHIAL 210 Triphasic BRACHIAL  AVF  DP Noncompressible Biphasic DP Noncompressible Monophasic  AT   AT    PT Noncompressible Biphasic PT Noncompressible Triphasic  PER   PER    GREAT TOE 151 NA GREAT TOE 78 NA    RIGHT LEFT  TBI 0.72 0.37        Left lower extremity venous duplex completed. Left lower extremity is negative for deep vein thrombosis. There is no  evidence of left Baker's cyst.  Left Lower Extremity Arterial Duplex has been completed. There is no obvious evidence of hemodynamically significant stenosis involving the visualized arteries of the left lower extremity.  Statin:  Yes.   Beta Blocker:  Yes.   Aspirin:  Yes.   ACEI:  No. ARB:  No. Other antiplatelets/anticoagulants:  No.    ASSESSMENT/PLAN: This is a 75 y.o. female who recently  underwent bilateral L5-S1 microdiskectomy 12/02/15 with recent onset of left leg pain radiating from left hip down left leg   -pt has biphasic doppler signals in bilateral DP/PT/peroneal.  She does have some evidence of atherosclerosis, but this is not affecting the blood flow to her feet and therefore do not feel this is related to her pain.  It does sound like she had some component of intermittent claudication vs diabetic neuropathy before she started having back issues.  -she does have DM and does not have any feeling in her feet--I stressed to her and her family to be aware and take meticulous care of her feet. -CKD 4--functioning LUA AVG in place (placed May 2016) -bilateral  carotid artery stenosis with bilateral carotid endarterectomy in the past by Dr. Scot Dock.  Pt has appointment for follow up with Dr. Scot Dock in May 2018.  She will call sooner if she has any issues.   Leontine Locket, PA-C [facility] [phone]    I agree with the above.  I have seen and evaluated the patient.  She has excellent doppler signals in both feet.  Arterial doppler studies show arterial calcification with normal waveforms.  THE TBI on the left is abnormal, suggestion that she has vascular changes to the vessels below her ankle.  I do not think that her current symptoms are related to arterial insufficiency.  Please contact me with additional questions or concerns   Annamarie Major [phone]

## 2015-12-22 NOTE — Consult Note (Signed)
Neurology Consultation Reason for Consult: Leg weakness Referring Physician: Benjiman Core  CC: Legs gave out  History is obtained from: Patient, family  HPI: [name] is a 75 y.o. female who underwent lumbar decompression in late May. She then went to rehabilitation and was discharged on the second then discharged home. She states that she intermittently has sudden muscle weakness that has been going on for some time but has been particularly bad over the past few days. She states that even for maybe a couple of years, she will intermittently drop things because her muscles just give out and her arm. She also has noted that this happens when she walks in her legs will suddenly give way. She will not lose consciousness, but will suddenly not be able to support herself. She says that this is an acute strike, and resolves nearly instantly.  She also has been noticing tremor which is been present for quite some time. Her husband also describes that she acts out her dreams in her sleep.  She was found to have a urinary tract infection on admission.  She worked with physical therapy today and states that she was too scared of falling to work with them effectively.  ROS: A 14 point ROS was performed and is negative except as noted in the HPI.   Past Medical History  Diagnosis Date  . Anemia   . Glaucoma   . Hypertension   . Blood transfusion   . GERD (gastroesophageal reflux disease)   . Arthritis   . Neuromuscular disorder ([place])     CARPEL TUNNEL  . Atrial fibrillation ([place])   . CAD (coronary artery disease)   . Carotid artery occlusion     Carotid Endartectom,y - left 2009.  Blockage Right being watched by Dr Scot Dock.  Marland Kitchen HOH (hard of hearing)   . Shortness of breath   . Depression   . History of kidney stones     passed  . Carotid stenosis   . Complication of anesthesia     pt. states that she was difficult to wake  . Stroke ([place])     hx of TIA  . Chronic kidney disease      patient states stage IV  . Cancer ([place])     .  top of head- melonoma  . Dysrhythmia   . History of pneumonia   . Diabetes mellitus without complication ([place])   . Myocardial infarction ([place])   . History of hiatal hernia   . Pneumonia   . General weakness 12/2015  . Hypokalemia 12/2015     Family History  Problem Relation Age of Onset  . Diabetes Mother   . Hypertension Mother   . Heart disease Mother     beofre age 51  . Heart attack Mother   . Stroke Mother   . Cancer Father   . Hyperlipidemia Father   . Hypertension Father   . Deep vein thrombosis Daughter   . Diabetes Daughter   . Hyperlipidemia Daughter   . Hypertension Daughter   . Heart disease Daughter   . Peripheral vascular disease Daughter      Social History:  reports that she has never smoked. She has never used smokeless tobacco. She reports that she does not drink alcohol or use illicit drugs.   Exam: Current vital signs: BP 135/71 mmHg  Pulse 83  Temp(Src) 98.1 F (36.7 C) (Oral)  Resp 20  Ht 5' 4.5" (1.638 m)  Wt 90.5 kg (199  lb 8.3 oz)  BMI 33.73 kg/m2  SpO2 98% Vital signs in last 24 hours: Temp:  [98.1 F (36.7 C)-98.3 F (36.8 C)] 98.1 F (36.7 C) (06/09 1353) Pulse Rate:  [80-103] 83 (06/09 1512) Resp:  [20] 20 (06/09 1353) BP: (133-193)/(60-86) 135/71 mmHg (06/09 1512) SpO2:  [93 %-100 %] 98 % (06/09 1353)   Physical Exam  Constitutional: Appears well-developed and well-nourished.  Psych: Affect appropriate to situation Eyes: No scleral injection HENT: No OP obstrucion Head: Normocephalic.  Cardiovascular: Normal rate and regular rhythm.  Respiratory: Effort normal and breath sounds normal to anterior ascultation GI: Soft.  No distension. There is no tenderness.  Skin: WDI  Neuro: Mental Status: Patient is awake, alert, oriented to person, place, month, year, and situation. Patient is able to give a clear and coherent history. No signs of aphasia or neglect Cranial  Nerves: II: Visual Fields are full. Pupils are equal, round, and reactive to light.   III,IV, VI: EOMI without ptosis or diploplia.  V: Facial sensation is symmetric to temperature VII: Facial movement is symmetric.  VIII: hearing is intact to voice X: Uvula elevates symmetrically XI: Shoulder shrug is symmetric. XII: tongue is midline without atrophy or fasciculations.  Motor: Tone is normal. Bulk is normal. 5/5 strength was present in bilateral arms. She has symmetric strength proximally at 4/5 in her legs, though with decreased effort due to pain in her left leg. She has decreased dorsiflexion on her left leg as well. Sensory: Sensation is symmetric to light touch in the arms and legs. Cerebellar: No ataxia, but she does have significant tremor bilaterally  She has no clear asterixis on exam  I have reviewed labs in epic and the results pertinent to this consultation are: Elevated creatinine  I have reviewed the images obtained: MRI L-spine-postoperative changes  Impression: 75 year old female with intermittent sudden onset muscle weakness and muscle jerks which by description sound most consistent with myoclonus. He does not have any asterixis on exam. I think that the most likely culprit in her case might be her renal insufficiency, she is also currently on narcotics which could cause worsening as well.  With underlying myoclonus, urinary tract infection certainly cause an exacerbation of this.  Recommendations: 1) would favor holding narcotics 2) treatment of UTI.  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 5) will follow.    Roland Rack, MD [facility] [phone]  If 7pm- 7am, please page neurology on call as listed in [place].

## 2015-12-23 LAB — GLUCOSE, CAPILLARY
Glucose-Capillary: 152 mg/dL — ABNORMAL HIGH (ref 65–99)
Glucose-Capillary: 168 mg/dL — ABNORMAL HIGH (ref 65–99)
Glucose-Capillary: 132 mg/dL — ABNORMAL HIGH (ref 65–99)

## 2015-12-23 LAB — PROCALCITONIN: Procalcitonin: 0.55 ng/mL

## 2015-12-23 NOTE — Evaluation (Signed)
Occupational Therapy Evaluation Patient Details Name: [name] MRN: [id number] DOB: [birth year] Today's Date: 12/23/2015    History of Present Illness 75 y.o. female admitted to [facility] on 12/20/15 for weakness.  Pt fell at home assisted to the floor by her granddaughter.  MRI of spine indicated no abnormalities at the low back.  Vascular surgery consulted to r/o claudication.  Medical workup in progress.  Pt with significant PMhx of a-fib, CAD, CEA, HOH, stroke, CKD, DM, MI, multiple falls, neck surgery, CABG, L knee surgery, and lumbar laminectomy 12/01/15.     Clinical Impression   Pt reports she required min assist for ADLs PTA. Currently pt requires min assist overall for ADLs and functional mobility. Pt seems to fatigue quickly with functional activities, requiring seated rest breaks throughout, but is eager to participate in therapy and return to functional independence. Pt seems to be anxious about falling during functional mobility and reports hx of multiple falls at home. Recommending HHOT for follow up in order to maximize independence and safety with ADLs and functional mobility upon return home. Pt planning to return home with 24/7 supervision from family. Pt would benefit from continued skilled OT to address established goals.    Follow Up Recommendations  Home health OT;Supervision/Assistance - 24 hour    Equipment Recommendations  None recommended by OT    Recommendations for Other Services       Precautions / Restrictions Precautions Precautions: Back;Fall Precaution Comments: reviewed log roll to get OOB, pt less anxious today Restrictions Weight Bearing Restrictions: No      Mobility Bed Mobility Overal bed mobility: Needs Assistance Bed Mobility: Rolling;Sidelying to Sit Rolling: Supervision Sidelying to sit: Supervision;HOB elevated       General bed mobility comments: Verbal cues for log roll.  HOB mildly elevated and pt using railing for leverage at trunk.     Transfers Overall transfer level: Needs assistance Equipment used: Rolling walker (2 wheeled) Transfers: Sit to/from [facility] Sit to Stand: Min guard;Min assist Stand pivot transfers: Min assist       General transfer comment: Min to min guard assist depending on height of surface she is standing from and if it has armrests or not.  Pt does better from higher surface with armrests.  Verbal cues for safe hand placement and precautions when using RW.     Balance Overall balance assessment: Needs assistance Sitting-balance support: Feet supported;No upper extremity supported Sitting balance-Leahy Scale: Good     Standing balance support: Bilateral upper extremity supported;No upper extremity supported Standing balance-Leahy Scale: Fair Standing balance comment: Preformed ADLs at sink for ~8 mins without sitting down with support of one, both hands or trunk against sink for balance.                             ADL Overall ADL's : Needs assistance/impaired Eating/Feeding: Set up;Sitting   Grooming: Min guard;Standing;Sitting;Wash/dry hands;Wash/dry face;Oral care;Brushing hair   Upper Body Bathing: Set up;Supervision/ safety;Sitting   Lower Body Bathing: Sit to/from stand;Minimal assistance   Upper Body Dressing : Set up;Supervision/safety;Sitting Upper Body Dressing Details (indicate cue type and reason): to don hospital gown Lower Body Dressing: Sit to/from stand;Minimal assistance   Toilet Transfer: Minimal assistance;Stand-pivot;BSC   Toileting- Clothing Manipulation and Hygiene: Sit to/from stand;Minimal assistance       Functional mobility during ADLs: Rolling walker;Minimal assistance General ADL Comments: Pt fatigues quickly, seems anxious with mobility, and requires rest breaks  but able to perform grooming standing for ~8 minutes. Pt aware that she fatigues quickly and L leg gives out on her with distance mobility. Pt with SOB during  functional standing activities; resolved quickly with seated rest break.     Vision Vision Assessment?: No apparent visual deficits   Perception     Praxis      Pertinent Vitals/Pain Pain Assessment: Faces Faces Pain Scale: Hurts a little bit Pain Location: L leg Pain Descriptors / Indicators: Guarding Pain Intervention(s): Limited activity within patient's tolerance;Monitored during session;Repositioned     Hand Dominance Right   Extremity/Trunk Assessment Upper Extremity Assessment Upper Extremity Assessment: Generalized weakness   Lower Extremity Assessment Lower Extremity Assessment: Defer to PT evaluation   Cervical / Trunk Assessment Cervical / Trunk Assessment: Other exceptions Cervical / Trunk Exceptions: recent lumber laminectomy   Communication Communication Communication: HOH (uses hearing aids)   Cognition Arousal/Alertness: Awake/alert Behavior During Therapy: WFL for tasks assessed/performed Overall Cognitive Status: Within Functional Limits for tasks assessed                     General Comments       Exercises       Shoulder Instructions      Home Living Family/patient expects to be discharged to:: Private residence Living Arrangements: Spouse/significant other Available Help at Discharge: Family;Available 24 hours/day Type of Home: House Home Access: Level entry     Home Layout: Two level;Able to live on main level with bedroom/bathroom Alternate Level Stairs-Number of Steps: 10 steps to upper level Alternate Level Stairs-Rails: Right Bathroom Shower/Tub: Walk-in shower;Door   Bathroom Toilet: Handicapped height Bathroom Accessibility: Yes   Home Equipment: Environmental consultant - 4 wheels;Walker - 2 wheels;Cane - single point;Bedside commode;Grab bars - toilet;Transport chair;Shower seat          Prior Functioning/Environment Level of Independence: Needs assistance  Gait / Transfers Assistance Needed: Pt was supervision for household  gait with RW.  Family has been assisting since home from inpatient rehab.  Pt also active with HHPT.  ADL's / Homemaking Assistance Needed: Daughter assists with dressing and IADLs        OT Diagnosis: Generalized weakness   OT Problem List: Decreased strength;Decreased activity tolerance;Impaired balance (sitting and/or standing);Decreased safety awareness   OT Treatment/Interventions: Self-care/ADL training;Therapeutic exercise;Energy conservation;Therapeutic activities;Balance training;Patient/family education    OT Goals(Current goals can be found in the care plan section) Acute Rehab OT Goals Patient Stated Goal: to go home OT Goal Formulation: With patient Time For Goal Achievement: 01/06/16 Potential to Achieve Goals: Good ADL Goals Pt Will Perform Grooming: with supervision;standing Pt Will Perform Upper Body Bathing: with supervision;sitting Pt Will Perform Lower Body Bathing: with supervision;sit to/from stand Pt Will Transfer to Toilet: with supervision;ambulating;bedside commode Pt Will Perform Toileting - Clothing Manipulation and hygiene: with supervision;sit to/from stand Pt/caregiver will Perform Home Exercise Program: Increased strength;Both right and left upper extremity;With theraband;With written HEP provided;Independently Additional ADL Goal #1: Pt will independently verbalize 3 energy conservation strateiges for increased safety with ADLs and functional mobility.  OT Frequency: Min 2X/week   Barriers to D/C:            Co-evaluation PT/OT/SLP Co-Evaluation/Treatment: Yes Reason for Co-Treatment: For patient/therapist safety;Other (comment) (increase pts confidence/decrease anxiety)   OT goals addressed during session: ADL's and self-care;Other (comment) (functional mobility)      End of Session Equipment Utilized During Treatment: Gait belt;Rolling walker Nurse Communication: Mobility status (RN observed pt walking in hallway)  Activity Tolerance:  Patient tolerated treatment well Patient left: in chair;with call bell/phone within reach;with chair alarm set;with nursing/sitter in room   Time: [id number] OT Time Calculation (min): 29 min Charges:  OT General Charges $OT Visit: 1 Procedure OT Evaluation $OT Eval Moderate Complexity: 1 Procedure G-Codes:     Binnie Kand M.S., OTR/L Pager: [phone]  12/23/2015, 12:13 PM

## 2015-12-23 NOTE — Progress Notes (Signed)
PROGRESS NOTE    [name]  [id number] DOB: 04/15/1941 DOA: 12/20/2015 PCP: Henrine Screws, MD   Brief Narrative: [name] is a 75 y.o. female with a Past Medical History of anemiua, glaucoma, HTN, GERD, Afib, CAD, SOB, carotid stenosis, CVA, CKD, MI who presents with progressive weakness and odd tremor. She was found to have UTI and started on rocephin. Orthopedics consulted and recommendations given.   Assessment & Plan:   Principal Problem:   Generalized weakness Active Problems:   Essential hypertension   Hyperlipidemia   Atrial fibrillation (HCC)   Diabetes mellitus, insulin dependent (IDDM), controlled (HCC)   Chronic kidney disease (CKD), stage V (HCC)   S/P lumbar discectomy   Lumbar back pain with radiculopathy affecting left lower extremity   Anemia of chronic kidney failure   Hx of gout   Acute hypokalemia   Generalized weakness/ lumbar back pain with radiculopathy/ s/p lumbar discectomy: MRI does not reveal any post operative complications.  Orthopedics consulted and recommendations given.  X rays of the ankle and foot are negative.  Venous duplex negative for DVT Arterial duplex : Unable to calculate bilateral ABIs due to noncompressible vessels bilaterally. Noncompressible vessels are suggestive of possible medial calcification. The right TBI is within normal limits, borderline abnormal. The left TBI is abnormal  PT /OT eval ordered.   Her leg pain probably from claudication and questionable myoclonus.  She reports much improvement today and worked with PT and appears cheerful. Reports the xanax helped her.   Hypertension: Sub optimal.  Resume home meds.    Hypokalemia: repleted as needed.   Diabetes mellitus: CBG (last 3)   Recent Labs  12/22/15 1657 12/22/15 2104 12/23/15 1255  GLUCAP 161* 195* 152*    hgba1c is 7.1 in May.  Resume lantus and SSI.    STAGE V CKD; Stable and at baseline.  Lasix on hold as she is  orthostatic, will start gentle hydration if still orthostatic today.     Possible Gout flare:currently on allopurinol.    Chronic atrial fibrillation: Resume bb in am.   Anemia of chronic disease: Appears to be stable.   UTI sec to pseudomonas: started on rocephin.    DVT prophylaxis: (Heparin) Code Status: (/Partial - no intubation) Family Communication: none at bedside.  Disposition Plan: pending further management and PT eval.    Consultants:   Orthopedics and vascular surgery  Physical therapy     Procedures:   venous duplex. No dvt.   Arterial duplex.  :Unable to calculate bilateral ABIs due to noncompressible vessels bilaterally. Noncompressible vessels are suggestive of possible medial calcification. The right TBI is within normal limits, borderline abnormal. The left TBI is abnormal  S/p L5-S1 decompression/microdiscectomy 01 Dec 2015   xray osf the knee ankle and foot.    Antimicrobials: rocephin 6/8   Subjective: Sitting in the chair , cheerful.   Objective: Filed Vitals:   12/23/15 0620 12/23/15 1419 12/23/15 1422 12/23/15 1630  BP:  124/47 99/40 142/60  Pulse:  73 135 84  Temp: 98.2 F (36.8 C)   98.2 F (36.8 C)  TempSrc: Oral   Oral  Resp:    18  Height:      Weight:      SpO2:    98%    Intake/Output Summary (Last 24 hours) at 12/23/15 1634 Last data filed at 12/23/15 1300  Gross per 24 hour  Intake    863 ml  Output    500 ml  Net    363 ml   Filed Weights   Dec 27, 2015 0003 2015/12/27 1725 12/23/15 0533  Weight: 90.719 kg (200 lb) 90.5 kg (199 lb 8.3 oz) 85.5 kg (188 lb 7.9 oz)    Examination:  General exam: Appears calm and comfortable  Respiratory system: Clear to auscultation. Respiratory effort normal. Cardiovascular system: S1 & S2 heard, RRR. No JVD, murmurs, rubs, gallops or clicks. No pedal edema. Gastrointestinal system: Abdomen is nondistended, soft and nontender. No organomegaly or masses felt. Normal bowel sounds  heard. Central nervous system: Alert and oriented. No new focal neurological deficits. Extremities: no pedal  edema.  Skin: No rashes, lesions or ulcers     Data Reviewed: I have personally reviewed following labs and imaging studies  CBC:  Recent Labs Lab 12-27-15 0445 12/22/15 0554  WBC 8.8 13.6*  NEUTROABS 6.3  --   HGB 10.9* 11.9*  HCT 35.0* 37.7  MCV 97.8 99.5  PLT 189 [id number]   Basic Metabolic Panel:  Recent Labs Lab 12-27-15 0445 2015-12-27 1803 12/22/15 0554  NA 142 141 143  K 3.0* 5.1 3.7  CL 105 107 107  CO2 27 24 25   GLUCOSE 125* 106* 101*  BUN 40* 40* 38*  CREATININE 2.15* 2.14* 2.14*  CALCIUM 8.6* 8.5* 8.7*  MG  --  1.3*  --   PHOS  --  3.3  --    GFR: Estimated Creatinine Clearance: 23.9 mL/min (by C-G formula based on Cr of 2.14). Liver Function Tests: No results for input(s): AST, ALT, ALKPHOS, BILITOT, PROT, ALBUMIN in the last 168 hours. No results for input(s): LIPASE, AMYLASE in the last 168 hours.  Recent Labs Lab 12/22/15 2058  AMMONIA 34   Coagulation Profile: No results for input(s): INR, PROTIME in the last 168 hours. Cardiac Enzymes: No results for input(s): CKTOTAL, CKMB, CKMBINDEX, TROPONINI in the last 168 hours. BNP (last 3 results) No results for input(s): PROBNP in the last 8760 hours. HbA1C: No results for input(s): HGBA1C in the last 72 hours. CBG:  Recent Labs Lab 12/22/15 0751 12/22/15 1156 12/22/15 1657 12/22/15 2104 12/23/15 1255  GLUCAP 94 108* 161* 195* 152*   Lipid Profile: No results for input(s): CHOL, HDL, LDLCALC, TRIG, CHOLHDL, LDLDIRECT in the last 72 hours. Thyroid Function Tests: No results for input(s): TSH, T4TOTAL, FREET4, T3FREE, THYROIDAB in the last 72 hours. Anemia Panel: No results for input(s): VITAMINB12, FOLATE, FERRITIN, TIBC, IRON, RETICCTPCT in the last 72 hours. Sepsis Labs:  Recent Labs Lab 2015-12-27 1756 12/23/15 0555  PROCALCITON 0.49 0.55    Recent Results (from the past  240 hour(s))  Urine culture     Status: Abnormal (Preliminary result)   Collection Time: 2015-12-27 10:22 PM  Result Value Ref Range Status   Specimen Description URINE, CLEAN CATCH  Final   Special Requests NONE  Final   Culture >=100,000 COLONIES/mL PSEUDOMONAS AERUGINOSA (A)  Final   Report Status PENDING  Incomplete         Radiology Studies: Dg Knee 1-2 Views Left  27-Dec-2015  CLINICAL DATA:  Fall, left knee swelling EXAM: LEFT KNEE - 1-2 VIEW COMPARISON:  None. FINDINGS: No fracture or dislocation is seen. Mild tricompartmental degenerative changes with chondrocalcinosis in the medial and lateral compartments. Visualized soft tissues are within normal limits, noting vascular calcifications. No suprapatellar knee joint effusion. IMPRESSION: No fracture or dislocation is seen. Mild degenerative changes with chondrocalcinosis. Electronically Signed   By: Julian Hy M.D.   On: December 27, 2015 21:20  Dg Ankle 2 Views Left  12/21/2015  CLINICAL DATA:  Fall, ankle swelling EXAM: LEFT ANKLE - 2 VIEW COMPARISON:  None. FINDINGS: Evaluation is mildly constrained by obliquity. No fracture or dislocation is seen. The ankle mortise is intact. Possible mild medial soft tissue swelling. Vascular calcifications. IMPRESSION: No fracture or dislocation is seen. Electronically Signed   By: Julian Hy M.D.   On: 12/21/2015 21:22   Dg Foot Complete Left  12/21/2015  CLINICAL DATA:  Fall, ankle swelling EXAM: LEFT FOOT - COMPLETE 3+ VIEW COMPARISON:  None. FINDINGS: No fracture or dislocation is seen. Mild degenerative changes at the MTP joint. Mild soft tissue swelling along the dorsal forefoot. Small plantar and posterior calcaneal enthesophytes. Vascular calcifications. IMPRESSION: No fracture or dislocation is seen. Electronically Signed   By: Julian Hy M.D.   On: 12/21/2015 21:21        Scheduled Meds: . allopurinol  100 mg Oral BID  . aspirin  325 mg Oral Daily  . atorvastatin  10  mg Oral q1800  . brimonidine  1 drop Left Eye BID  . calcitRIOL  0.25 mcg Oral QODAY  . cefTRIAXone (ROCEPHIN)  IV  1 g Intravenous Q24H  . cholecalciferol  2,000 Units Oral BID  . citalopram  20 mg Oral Daily  . diclofenac sodium  2 g Topical QID  . furosemide  80 mg Oral Q breakfast  . heparin  5,000 Units Subcutaneous Q8H  . insulin aspart  0-5 Units Subcutaneous QHS  . insulin aspart  0-9 Units Subcutaneous TID WC  . insulin glargine  20 Units Subcutaneous QHS  . isosorbide mononitrate  30 mg Oral Daily  . linagliptin  5 mg Oral Q breakfast  . metoprolol  100 mg Oral BID  . MUSCLE RUB  1 application Topical BID  . omega-3 acid ethyl esters  2 g Oral Daily  . pantoprazole  40 mg Oral Daily  . prazosin  1 mg Oral QHS  . sodium chloride flush  3 mL Intravenous Q12H  . timolol  1 drop Left Eye Daily  . Travoprost (BAK Free)  1 drop Left Eye QHS  . vitamin B-12  1,000 mcg Oral Daily   Continuous Infusions:    LOS: 2 days    Time spent: 20 minutes.    Hosie Poisson, MD [facility] Pager [phone]   If 7PM-7AM, please contact night-coverage [email or web address] Password [facility] 12/23/2015, 4:34 PM

## 2015-12-23 NOTE — Progress Notes (Signed)
Patient ID: [name], female   DOB: 09-24-1940, 75 y.o.   MRN: [id number] Patient is seen this morning she is working with physical therapy. Patient is making good progress with her therapy. Patient's lower extremity weakness and claudication does seem to be due to her noncompressible calcified arterial circulation to her lower extremities left worse than right. I will follow-up as needed.

## 2015-12-23 NOTE — Progress Notes (Signed)
Physical Therapy Treatment Patient Details Name: [name] MRN: [id number] DOB: 1941/07/10 Today's Date: 12/23/2015    History of Present Illness 75 y.o. female admitted to [facility] on 12/20/15 for weakness.  Pt fell at home assisted to the floor by her granddaughter.  MRI of spine indicated no abnormalities at the low back.  Vascular surgery consulted to r/o claudication.  Medical workup in progress.  Pt with significant PMhx of a-fib, CAD, CEA, HOH, stroke, CKD, DM, MI, multiple falls, neck surgery, CABG, L knee surgery, and lumbar laminectomy 12/01/15.      PT Comments    Pt did much better physically today and was able to progress gait down the hallway with min assist overall and chair to follow to increase safety and make pt more confident/less anxious.  She did well and I am more confident in my recommendation for HHPT and family assist at discharge.  PT will continue to follow acutely to progress mobility to prepare for d/c home.   Follow Up Recommendations  Home health PT;Supervision/Assistance - 24 hour     Equipment Recommendations  None recommended by PT    Recommendations for Other Services   NA     Precautions / Restrictions Precautions Precautions: Back;Fall Precaution Comments: reviewed log roll to get OOB, pt less anxious today    Mobility  Bed Mobility Overal bed mobility: Needs Assistance Bed Mobility: Rolling;Sidelying to Sit Rolling: Supervision Sidelying to sit: Supervision;HOB elevated       General bed mobility comments: Verbal cues for log roll.  HOB mildly elevated and pt using railing for leverage at trunk.    Transfers Overall transfer level: Needs assistance Equipment used: Rolling walker (2 wheeled) Transfers: Sit to/from Stand Sit to Stand: Min guard;Min assist Stand pivot transfers: Min assist       General transfer comment: Min to min guard assist depending on height of surface she is standing from and if it has armrests or not.  Pt does  better from higher surface with armrests.  Verbal cues for safe hand placement and precautions when using RW.   Ambulation/Gait Ambulation/Gait assistance: +2 safety/equipment;Min assist;Min guard Ambulation Distance (Feet): 75 Feet (x2 with seated rest break) Assistive device: Rolling walker (2 wheeled) Gait Pattern/deviations: Step-through pattern;Decreased weight shift to left;Antalgic Gait velocity: decreased Gait velocity interpretation: Below normal speed for age/gender General Gait Details: Pt did well with gait with chair to follow to increase confidence.  She did show signs of pain and weakness in left leg as she continued to increase her gait distance.  After she took a seated rest break it seemed to help and then as she got further down the hallway her left leg symptoms (per pt weakness more than pain) would increase.            Balance Overall balance assessment: Needs assistance Sitting-balance support: Feet supported;No upper extremity supported Sitting balance-Leahy Scale: Good     Standing balance support: Bilateral upper extremity supported;No upper extremity supported;Single extremity supported Standing balance-Leahy Scale: Fair Standing balance comment: Preformed ADLs at sink for ~8 mins without sitting down with support of one, both hands or trunk against sink for balance.                     Cognition Arousal/Alertness: Awake/alert Behavior During Therapy: WFL for tasks assessed/performed Overall Cognitive Status: Within Functional Limits for tasks assessed  General Comments General comments (skin integrity, edema, etc.): Tremor/jerks much improved today.  Interestingly enough when I walked in and she was asleep they were present.       Pertinent Vitals/Pain Pain Assessment: Faces Faces Pain Scale: Hurts a little bit Pain Location: left leg Pain Descriptors / Indicators: Guarding (almost antalgic gait pattern as she  walks further) Pain Intervention(s): Limited activity within patient's tolerance;Monitored during session;Repositioned           PT Goals (current goals can now be found in the care plan section) Acute Rehab PT Goals Patient Stated Goal: to go home Progress towards PT goals: Progressing toward goals    Frequency  Min 3X/week    PT Plan Current plan remains appropriate    Co-evaluation PT/OT/SLP Co-Evaluation/Treatment: Yes Reason for Co-Treatment: For patient/therapist safety (increase pt's confidence/decrease anxiety)         End of Session Equipment Utilized During Treatment: Gait belt Activity Tolerance: Patient limited by fatigue;Patient limited by pain Patient left: in chair;with call bell/phone within reach;with chair alarm set;with nursing/sitter in room     Time: [id number] PT Time Calculation (min) (ACUTE ONLY): 37 min  Charges:  $Gait Training: 8-22 mins                      Marge Vandermeulen B. Adams, [place], DPT [phone]   12/23/2015, 8:24 AM

## 2015-12-23 NOTE — Progress Notes (Signed)
Patient seems to be improving with physical therapy. The description that she gives of her lower extremity weakness certainly could be myoclonus, but it is very difficult to be certain based on her history. She does describe shaking and weakness, which could be claudication but I still wonder if myoclonus is the culprit given that she also occasionally will drop things from her arms. She also has a clear description) behavior disorder from her husband. She is awake, alert, interactive and appropriate.  Diagnosis: Essential tremor Myoclonus REM behavior disorder  Recommendations: 1) limit narcotic use 2) treatment of UTI 3) no further recommendations at this time, she continues to have myoclonus then Keppra or Depakote could be attempted but I would not favor doing this at this time. Neurology will sign off please call with any further questions or concerns  Roland Rack, MD [facility] [phone]  If 7pm- 7am, please page neurology on call as listed in [place].

## 2015-12-24 DIAGNOSIS — I10 Essential (primary) hypertension: Secondary | ICD-10-CM

## 2015-12-24 DIAGNOSIS — M5417 Radiculopathy, lumbosacral region: Secondary | ICD-10-CM

## 2015-12-24 DIAGNOSIS — Z8639 Personal history of other endocrine, nutritional and metabolic disease: Secondary | ICD-10-CM

## 2015-12-24 LAB — BASIC METABOLIC PANEL
ANION GAP: 13 (ref 5–15)
BUN: 53 mg/dL — ABNORMAL HIGH (ref 6–20)
CALCIUM: 8.2 mg/dL — AB (ref 8.9–10.3)
CHLORIDE: 105 mmol/L (ref 101–111)
CO2: 21 mmol/L — ABNORMAL LOW (ref 22–32)
CREATININE: 2.86 mg/dL — AB (ref 0.44–1.00)
GFR calc non Af Amer: 15 mL/min — ABNORMAL LOW (ref >60)
GFR, EST AFRICAN AMERICAN: 17 mL/min — AB (ref >60)
Glucose, Bld: 127 mg/dL — ABNORMAL HIGH (ref 65–99)
Potassium: 4.9 mmol/L (ref 3.5–5.1)
SODIUM: 139 mmol/L (ref 135–145)

## 2015-12-24 LAB — URINE CULTURE: Culture: >=100000 — AB

## 2015-12-24 LAB — CBC
HCT: 31.8 % — ABNORMAL LOW (ref 36.0–46.0)
HEMOGLOBIN: 10 g/dL — AB (ref 12.0–15.0)
MCH: 30.8 pg (ref 26.0–34.0)
MCHC: 31.4 g/dL (ref 30.0–36.0)
MCV: 97.8 fL (ref 78.0–100.0)
Platelets: 134 10*3/uL — ABNORMAL LOW (ref 150–400)
RBC: 3.25 MIL/uL — ABNORMAL LOW (ref 3.87–5.11)
RDW: 15.6 % — AB (ref 11.5–15.5)
WBC: 6.8 10*3/uL (ref 4.0–10.5)

## 2015-12-24 LAB — GLUCOSE, CAPILLARY
GLUCOSE-CAPILLARY: 145 mg/dL — AB (ref 65–99)
GLUCOSE-CAPILLARY: 132 mg/dL — AB (ref 65–99)
GLUCOSE-CAPILLARY: 158 mg/dL — AB (ref 65–99)
Glucose-Capillary: 165 mg/dL — ABNORMAL HIGH (ref 65–99)

## 2015-12-24 MED ORDER — TRAMADOL HCL 50 MG PO TABS: 50.0000 mg | ORAL_TABLET | Freq: Two times a day (BID) | ORAL | Status: DC | PRN

## 2015-12-24 MED ORDER — CIPROFLOXACIN HCL 500 MG PO TABS
250.0000 mg | ORAL_TABLET | Freq: Two times a day (BID) | ORAL | Status: DC
  Administered 2015-12-24 – 2015-12-25 (×3): 250 mg via ORAL
  Filled 2015-12-24 (×3): qty 1

## 2015-12-24 NOTE — Progress Notes (Signed)
Physical Therapy Treatment Patient Details Name: [name] MRN: [id number] DOB: April 10, 1941 Today's Date: 12/24/2015    History of Present Illness 75 y.o. female admitted to [facility] on 12/20/15 for weakness.  Pt fell at home assisted to the floor by her granddaughter.  MRI of spine indicated no abnormalities at the low back.  Vascular surgery consulted to r/o claudication.  Medical workup in progress.  Pt with significant PMhx of a-fib, CAD, CEA, HOH, stroke, CKD, DM, MI, multiple falls, neck surgery, CABG, L knee surgery, and lumbar laminectomy 12/01/15.      PT Comments    Pt admitted with above diagnosis. Pt currently with functional limitations due to endurance deficits. Pt was able to ambulate well today and should be able to go home when MD is ready and get [place] f/u.   Pt will benefit from skilled PT to increase their independence and safety with mobility to allow discharge to the venue listed below.    Follow Up Recommendations  Home health PT;Supervision/Assistance - 24 hour     Equipment Recommendations  None recommended by PT    Recommendations for Other Services       Precautions / Restrictions Precautions Precautions: Back;Fall Precaution Booklet Issued: No Precaution Comments: reviewed log roll to get OOB, pt less anxious today Restrictions Weight Bearing Restrictions: No    Mobility  Bed Mobility               General bed mobility comments: in chair  Transfers Overall transfer level: Needs assistance Equipment used: Rolling walker (2 wheeled) Transfers: Sit to/from Stand Sit to Stand: Min guard         General transfer comment: No physical assist needed  Ambulation/Gait Ambulation/Gait assistance: Min guard Ambulation Distance (Feet): 200 Feet Assistive device: Rolling walker (2 wheeled) Gait Pattern/deviations: Step-through pattern;Decreased stride length   Gait velocity interpretation: Below normal speed for age/gender General Gait Details: Pt  with good safetywith RW today.  States her left leg feels so much better.  Pt and husband feel like pt is ready to go home.Plan is tomororw.    Stairs            Wheelchair Mobility    Modified Rankin (Stroke Patients Only)       Balance Overall balance assessment: Needs assistance Sitting-balance support: No upper extremity supported;Feet supported Sitting balance-Leahy Scale: Good     Standing balance support: Bilateral upper extremity supported;During functional activity Standing balance-Leahy Scale: Fair Standing balance comment: can stand statically without UE support.                    Cognition Arousal/Alertness: Awake/alert Behavior During Therapy: WFL for tasks assessed/performed Overall Cognitive Status: Within Functional Limits for tasks assessed       Memory: Decreased recall of precautions              Exercises      General Comments        Pertinent Vitals/Pain Pain Assessment: No/denies pain  VSS    Home Living                      Prior Function            PT Goals (current goals can now be found in the care plan section) Progress towards PT goals: Progressing toward goals    Frequency  Min 3X/week    PT Plan Current plan remains appropriate    Co-evaluation  End of Session Equipment Utilized During Treatment: Gait belt Activity Tolerance: Patient limited by fatigue Patient left: in chair;with call bell/phone within reach;with chair alarm set;with family/visitor present     Time: [id number] PT Time Calculation (min) (ACUTE ONLY): 13 min  Charges:  $Gait Training: 8-22 mins                    G Codes:      [name] 01-09-16, 1:42 PM  [place] Leib Elahi,PT Acute Rehabilitation [phone] (pager)

## 2015-12-24 NOTE — Progress Notes (Signed)
PROGRESS NOTE    [name]  [id number] DOB: December 28, 1940 DOA: 12/20/2015 PCP: Henrine Screws, MD   Brief Narrative: [name] is a 75 y.o. female with a Past Medical History of anemiua, glaucoma, HTN, GERD, Afib, CAD, SOB, carotid stenosis, CVA, CKD, MI who presents with progressive weakness and odd tremor. She was found to have UTI and started on rocephin. Orthopedics consulted and recommendations given.   Assessment & Plan:   Principal Problem:   Generalized weakness Active Problems:   Essential hypertension   Hyperlipidemia   Atrial fibrillation (HCC)   Diabetes mellitus, insulin dependent (IDDM), controlled (HCC)   Chronic kidney disease (CKD), stage V (HCC)   S/P lumbar discectomy   Lumbar back pain with radiculopathy affecting left lower extremity   Anemia of chronic kidney failure   Hx of gout   Acute hypokalemia   Generalized weakness/ lumbar back pain with radiculopathy/ s/p lumbar discectomy: MRI does not reveal any post operative complications.  Orthopedics consulted and recommendations given.  X rays of the ankle and foot are negative.  Venous duplex negative for DVT Arterial duplex : Unable to calculate bilateral ABIs due to noncompressible vessels bilaterally. Noncompressible vessels are suggestive of possible medial calcification. The right TBI is within normal limits, borderline abnormal. The left TBI is abnormal  PT /OT eval ordered.   Her leg pain probably from claudication and questionable myoclonus.  She reports much improvement today and worked with PT and appears cheerful. Reports the xanax helped her.   Hypertension: Better controlled.  Resume home meds.    Hypokalemia: repleted as needed.   Diabetes mellitus: CBG (last 3)   Recent Labs  12/23/15 2138 12/24/15 0840 12/24/15 1220  GLUCAP 132* 145* 132*    hgba1c is 7.1 in May.  Resume lantus and SSI.    STAGE V CKD; Stable and at baseline.  Lasix on hold as she is  orthostatic, will start gentle hydration if still orthostatic today.     Possible Gout flare:  currently on allopurinol.    Chronic atrial fibrillation: Resume bb .   Anemia of chronic disease: Appears to be stable.   UTI sec to pseudomonas: rocephin changed to ciprofloxacin.    Loose Bm; ?diarrhea, will get stool for c diff if she has more diarrhea.    DVT prophylaxis: (Heparin) Code Status: (/Partial - no intubation) Family Communication: none at bedside.  Disposition Plan: pending further management and PT eval.    Consultants:   Orthopedics and vascular surgery  Physical therapy     Procedures:   venous duplex. No dvt.   Arterial duplex.  :Unable to calculate bilateral ABIs due to noncompressible vessels bilaterally. Noncompressible vessels are suggestive of possible medial calcification. The right TBI is within normal limits, borderline abnormal. The left TBI is abnormal  S/p L5-S1 decompression/microdiscectomy 01 Dec 2015   xray osf the knee ankle and foot.    Antimicrobials: rocephin 6/8   Subjective: Sitting in the chair , cheerful.   Objective: Filed Vitals:   12/24/15 0811 12/24/15 1223 12/24/15 1229 12/24/15 1232  BP: 162/72 148/52 127/51 143/57  Pulse: 62 63 67 68  Temp: 98.1 F (36.7 C) 98 F (36.7 C)    TempSrc: Oral Oral    Resp: 16 16    Height:      Weight:      SpO2:  98% 97%     Intake/Output Summary (Last 24 hours) at 12/24/15 1506 Last data filed at 12/24/15 1006  Gross per 24 hour  Intake    870 ml  Output    900 ml  Net    -30 ml   Filed Weights   12/21/15 1725 12/23/15 0533 12/24/15 0604  Weight: 90.5 kg (199 lb 8.3 oz) 85.5 kg (188 lb 7.9 oz) 84.142 kg (185 lb 8 oz)    Examination:  General exam: Appears calm and comfortable  Respiratory system: Clear to auscultation. Respiratory effort normal. Cardiovascular system: S1 & S2 heard, RRR. No JVD, murmurs, rubs, gallops or clicks. No pedal edema. Gastrointestinal  system: Abdomen is nondistended, soft and nontender. No organomegaly or masses felt. Normal bowel sounds heard. Central nervous system: Alert and oriented. No new focal neurological deficits. Extremities: no pedal  edema.  Skin: No rashes, lesions or ulcers     Data Reviewed: I have personally reviewed following labs and imaging studies  CBC:  Recent Labs Lab 12/21/15 0445 12/22/15 0554 12/24/15 0552  WBC 8.8 13.6* 6.8  NEUTROABS 6.3  --   --   HGB 10.9* 11.9* 10.0*  HCT 35.0* 37.7 31.8*  MCV 97.8 99.5 97.8  PLT 189 191 [id number]*   Basic Metabolic Panel:  Recent Labs Lab 12/21/15 0445 12/21/15 1803 12/22/15 0554 12/24/15 0552  NA 142 141 143 139  K 3.0* 5.1 3.7 4.9  CL 105 107 107 105  CO2 27 24 25  21*  GLUCOSE 125* 106* 101* 127*  BUN 40* 40* 38* 53*  CREATININE 2.15* 2.14* 2.14* 2.86*  CALCIUM 8.6* 8.5* 8.7* 8.2*  MG  --  1.3*  --   --   PHOS  --  3.3  --   --    GFR: Estimated Creatinine Clearance: 17.8 mL/min (by C-G formula based on Cr of 2.86). Liver Function Tests: No results for input(s): AST, ALT, ALKPHOS, BILITOT, PROT, ALBUMIN in the last 168 hours. No results for input(s): LIPASE, AMYLASE in the last 168 hours.  Recent Labs Lab 12/22/15 2058  AMMONIA 34   Coagulation Profile: No results for input(s): INR, PROTIME in the last 168 hours. Cardiac Enzymes: No results for input(s): CKTOTAL, CKMB, CKMBINDEX, TROPONINI in the last 168 hours. BNP (last 3 results) No results for input(s): PROBNP in the last 8760 hours. HbA1C: No results for input(s): HGBA1C in the last 72 hours. CBG:  Recent Labs Lab 12/23/15 1255 12/23/15 1746 12/23/15 2138 12/24/15 0840 12/24/15 1220  GLUCAP 152* 168* 132* 145* 132*   Lipid Profile: No results for input(s): CHOL, HDL, LDLCALC, TRIG, CHOLHDL, LDLDIRECT in the last 72 hours. Thyroid Function Tests: No results for input(s): TSH, T4TOTAL, FREET4, T3FREE, THYROIDAB in the last 72 hours. Anemia Panel: No results  for input(s): VITAMINB12, FOLATE, FERRITIN, TIBC, IRON, RETICCTPCT in the last 72 hours. Sepsis Labs:  Recent Labs Lab 12/21/15 1756 12/23/15 0555  PROCALCITON 0.49 0.55    Recent Results (from the past 240 hour(s))  Urine culture     Status: Abnormal   Collection Time: 12/21/15 10:22 PM  Result Value Ref Range Status   Specimen Description URINE, CLEAN CATCH  Final   Special Requests NONE  Final   Culture >=100,000 COLONIES/mL PSEUDOMONAS AERUGINOSA (A)  Final   Report Status 12/24/2015 FINAL  Final   Organism ID, Bacteria PSEUDOMONAS AERUGINOSA (A)  Final      Susceptibility   Pseudomonas aeruginosa - MIC*    CEFTAZIDIME 4 SENSITIVE Sensitive     CIPROFLOXACIN 0.5 SENSITIVE Sensitive     GENTAMICIN 8 INTERMEDIATE Intermediate     IMIPENEM  2 SENSITIVE Sensitive     PIP/TAZO 8 SENSITIVE Sensitive     CEFEPIME 8 SENSITIVE Sensitive     * >=100,000 COLONIES/mL PSEUDOMONAS AERUGINOSA         Radiology Studies: No results found.      Scheduled Meds: . allopurinol  100 mg Oral BID  . aspirin  325 mg Oral Daily  . atorvastatin  10 mg Oral q1800  . brimonidine  1 drop Left Eye BID  . calcitRIOL  0.25 mcg Oral QODAY  . cholecalciferol  2,000 Units Oral BID  . ciprofloxacin  250 mg Oral BID  . citalopram  20 mg Oral Daily  . diclofenac sodium  2 g Topical QID  . furosemide  80 mg Oral Q breakfast  . heparin  5,000 Units Subcutaneous Q8H  . insulin aspart  0-5 Units Subcutaneous QHS  . insulin aspart  0-9 Units Subcutaneous TID WC  . insulin glargine  20 Units Subcutaneous QHS  . isosorbide mononitrate  30 mg Oral Daily  . linagliptin  5 mg Oral Q breakfast  . metoprolol  100 mg Oral BID  . MUSCLE RUB  1 application Topical BID  . omega-3 acid ethyl esters  2 g Oral Daily  . pantoprazole  40 mg Oral Daily  . prazosin  1 mg Oral QHS  . sodium chloride flush  3 mL Intravenous Q12H  . timolol  1 drop Left Eye Daily  . Travoprost (BAK Free)  1 drop Left Eye QHS  .  vitamin B-12  1,000 mcg Oral Daily   Continuous Infusions:    LOS: 3 days    Time spent: 20 minutes.    Hosie Poisson, MD [facility] Pager [phone]   If 7PM-7AM, please contact night-coverage [email or web address] Password [email or web address] 12/24/2015, 3:06 PM

## 2015-12-24 NOTE — Progress Notes (Signed)
Was notified by NT, Pts B/P 166/48. Notified MD via text.  Will monitor.

## 2015-12-25 LAB — VAS US LOWER EXTREMITY ARTERIAL DUPLEX
LATIBDISTSYS: 86 cm/s
LPTIBDISTSYS: 49 cm/s
LPTIBPROXSYS: -100 cm/s
LSFDPSV: -77 cm/s
LSFMPSV: -131 cm/s
LSFPPSV: -134 cm/s
Left ant tibial mid sys: -135 cm/s
Left ant tibial sys PSV: 122 cm/s
Left popliteal dist sys PSV: 103 cm/s
Left popliteal prox sys PSV: 93 cm/s
left post tibial mid sys: 89 cm/s

## 2015-12-25 LAB — GLUCOSE, CAPILLARY
GLUCOSE-CAPILLARY: 131 mg/dL — AB (ref 65–99)
GLUCOSE-CAPILLARY: 141 mg/dL — AB (ref 65–99)
Glucose-Capillary: 92 mg/dL (ref 65–99)

## 2015-12-25 LAB — PROCALCITONIN: PROCALCITONIN: 0.49 ng/mL

## 2015-12-25 MED ORDER — CLONAZEPAM 0.5 MG PO TABS: 0.2500 mg | ORAL_TABLET | Freq: Every day | ORAL | Status: DC | PRN

## 2015-12-25 MED ORDER — ISOSORBIDE MONONITRATE ER 60 MG PO TB24: 60.0000 mg | ORAL_TABLET | Freq: Every day | ORAL | Status: DC

## 2015-12-25 MED ORDER — CIPROFLOXACIN HCL 250 MG PO TABS: 250.0000 mg | ORAL_TABLET | Freq: Two times a day (BID) | ORAL | Status: DC

## 2015-12-25 MED ORDER — METHOCARBAMOL 500 MG PO TABS: 500.0000 mg | ORAL_TABLET | Freq: Four times a day (QID) | ORAL | Status: DC | PRN

## 2015-12-25 MED ORDER — ISOSORBIDE MONONITRATE ER 30 MG PO TB24: 30.0000 mg | ORAL_TABLET | Freq: Every day | ORAL | Status: DC

## 2015-12-25 MED ORDER — DARBEPOETIN ALFA 100 MCG/0.5ML IJ SOSY
100.0000 ug | PREFILLED_SYRINGE | Freq: Once | INTRAMUSCULAR | Status: DC
  Filled 2015-12-25: qty 0.5

## 2015-12-25 MED ORDER — ISOSORBIDE MONONITRATE ER 30 MG PO TB24
30.0000 mg | ORAL_TABLET | Freq: Every day | ORAL | Status: DC
  Administered 2015-12-25: 30 mg via ORAL
  Filled 2015-12-25: qty 1

## 2015-12-25 NOTE — Progress Notes (Signed)
Return call received.  Ordered to give the lopressor.  Will continue to monitor.

## 2015-12-25 NOTE — Discharge Summary (Addendum)
Physician Discharge Summary  SELLA BUYS [id number] DOB: Feb 29, 1941 DOA: 12/20/2015  PCP: Henrine Screws, MD  Admit date: 12/20/2015 Discharge date: 12/25/2015  Admitted From: Home, Disposition: (Home   Recommendations for Outpatient Follow-up:  1. Follow up with PCP in 1-2 weeks 2. Please obtain BMP/CBC in one week 3. Please follow up on the following pending results:  Home Health:yes. )  Discharge Condition: stable.  CODE STATUS:full  Diet recommendation: Heart Healthy /  Brief/Interim Summary: [name] is a 75 y.o. female with a Past Medical History of anemiua, glaucoma, HTN, GERD, Afib, CAD, SOB, carotid stenosis, CVA, CKD, MI who presents with progressive weakness and odd tremor. She was found to have UTI and started on rocephin. Orthopedics consulted and recommendations given.  Discharge Diagnoses:  Principal Problem:   Generalized weakness Active Problems:   Essential hypertension   Hyperlipidemia   Atrial fibrillation (HCC)   Diabetes mellitus, insulin dependent (IDDM), controlled (HCC)   Chronic kidney disease (CKD), stage V (HCC)   S/P lumbar discectomy   Lumbar back pain with radiculopathy affecting left lower extremity   Anemia of chronic kidney failure   Hx of gout   Acute hypokalemia  Generalized weakness/ lumbar back pain with radiculopathy/ s/p lumbar discectomy: Probably from deconditioning and infection.  MRI does not reveal any post operative complications.  Orthopedics consulted and recommendations given.  X rays of the ankle and foot are negative.  Venous duplex negative for DVT Arterial duplex : Unable to calculate bilateral ABIs due to noncompressible vessels bilaterally. Noncompressible vessels are suggestive of possible medial calcification. The right TBI is within normal limits, borderline abnormal. The left TBI is abnormal    Her leg pain probably from claudication and questionable myoclonus.  She reports much improvement  today and worked with PT and appears cheerful. Reports the xanax helped her.   Hypertension: Better controlled.  Resume home meds.    Hypokalemia: repleted as needed.   Diabetes mellitus: CBG (last 3)   Recent Labs (last 2 labs)      Recent Labs  12/23/15 2138 12/24/15 0840 12/24/15 1220  GLUCAP 132* 145* 132*      hgba1c is 7.1 in May.  Resume lantus and SSI.    STAGE V CKD; Stable and at baseline.     Possible Gout flare:  currently on allopurinol.    Chronic atrial fibrillation: Resume beta blocker  , rate controlled. .   Anemia of chronic disease: Appears to be stable.   UTI sec to pseudomonas: rocephin changed to ciprofloxacin on discharge.           Discharge Instructions  Discharge Instructions    AMB Referral to [facility]    Complete by:  As directed   Reason for consult:  Multiple admissions  Expected date of contact:  1-3 days (reserved for hospital discharges)  Please assign to community nurse for transition of care calls and assess for home visits. Multiple admissions, HX with [facility].  Questions please call:   Natividad Brood, RN BSN [facility] Liaison  [phone] business mobile phone Toll free office [phone]     Diet - low sodium heart healthy    Complete by:  As directed      Discharge instructions    Complete by:  As directed   Please follow up with PCP in 1 to 2 weeks.  please follow up with neurology in 1 to 2 weeks if you have recurrent symptoms for an  EMG.            Medication List    STOP taking these medications        predniSONE 5 MG tablet  Commonly known as:  DELTASONE      TAKE these medications        acetaminophen 325 MG tablet  Commonly known as:  TYLENOL  Take 2 tablets (650 mg total) by mouth every 6 (six) hours as needed for mild pain.     allopurinol 100 MG tablet  Commonly known as:  ZYLOPRIM  Take 100 mg by mouth 2 (two) times daily.      aspirin 325 MG tablet  Take 325 mg by mouth daily.     atorvastatin 10 MG tablet  Commonly known as:  LIPITOR  TAKE ONE TABLET BY MOUTH ONE TIME DAILY     brimonidine 0.1 % Soln  Commonly known as:  ALPHAGAN P  Place 1 drop into the left eye 2 (two) times daily.     calcitRIOL 0.25 MCG capsule  Commonly known as:  ROCALTROL  Take 0.25 mcg by mouth every other day.     ciprofloxacin 250 MG tablet  Commonly known as:  CIPRO  Take 1 tablet (250 mg total) by mouth 2 (two) times daily.  Notes to Patient:  antibiotic     citalopram 20 MG tablet  Commonly known as:  CELEXA  Take 20 mg by mouth daily.     clonazePAM 0.5 MG tablet  Commonly known as:  KLONOPIN  Take 0.5 tablets (0.25 mg total) by mouth daily as needed (for anxiety).     colchicine 0.6 MG tablet  Take 0.6 mg by mouth daily as needed. Gout flare up     diclofenac sodium 1 % Gel  Commonly known as:  VOLTAREN  Apply 2 g topically 4 (four) times daily.     fish oil-omega-3 fatty acids 1000 MG capsule  Take 2 g by mouth daily.     furosemide 80 MG tablet  Commonly known as:  LASIX  Take 1 tablet (80 mg total) by mouth daily.     HUMALOG 100 UNIT/ML injection  Generic drug:  insulin lispro  Inject 0-12 Units into the skin 3 (three) times daily with meals. Sliding scale     insulin glargine 100 UNIT/ML injection  Commonly known as:  LANTUS  Inject 0.2-0.3 mLs (20-30 Units total) into the skin at bedtime.     isosorbide mononitrate 30 MG 24 hr tablet  Commonly known as:  IMDUR  Take 1 tablet (30 mg total) by mouth daily.     JANUVIA 25 MG tablet  Generic drug:  sitaGLIPtin  Take 25 mg by mouth daily.     linagliptin 5 MG Tabs tablet  Commonly known as:  TRADJENTA  Take 1 tablet (5 mg total) by mouth daily with breakfast.     methocarbamol 500 MG tablet  Commonly known as:  ROBAXIN  Take 1 tablet (500 mg total) by mouth every 6 (six) hours as needed for muscle spasms. Wean to one pill four times a day  as needed after a week.     metoprolol 100 MG tablet  Commonly known as:  LOPRESSOR  Take 1 tablet (100 mg total) by mouth 2 (two) times daily.     MUSCLE RUB 10-15 % Crea  Apply 1 application topically 2 (two) times daily.     nitroGLYCERIN 0.4 MG SL tablet  Commonly known as:  NITROSTAT  Place  0.4 mg under the tongue every 5 (five) minutes as needed. Chest pain     omeprazole 20 MG capsule  Commonly known as:  PRILOSEC  Take 20 mg by mouth daily as needed. Acid reflux     prazosin 1 MG capsule  Commonly known as:  MINIPRESS  Take 1 capsule (1 mg total) by mouth at bedtime.     timolol 0.5 % ophthalmic solution  Commonly known as:  TIMOPTIC  Place 1 drop into the left eye daily.     travoprost (benzalkonium) 0.004 % ophthalmic solution  Commonly known as:  TRAVATAN  Place 1 drop into the left eye at bedtime.     VITAMIN B 12 PO  Take 1,000 mg by mouth daily.     VITAMIN B COMPLEX PO  Take 1 tablet by mouth daily.     Vitamin D 2000 units tablet  Take 2,000 Units by mouth 2 (two) times daily.           Follow-up Information    Follow up with [facility].   Why:  Resumption of home health PT/OT arranged   Contact information:   [address] [place] [address] [phone]       Follow up with GATES,ROBERT NEVILL, MD In 1 week.   Specialty:  Internal Medicine   Contact information:   [address] [place] [address] [phone]       Follow up with Neurology .   Why:  1-2 weeks as needed if symptoms worsen     Allergies  Allergen Reactions  . Gabapentin Other (See Comments)    hallucinations  . Other     Other reaction(s): Fatigue, Gout, Hallucintions  . Sulfa Antibiotics     Other reaction(s): psychiatric disturbances  . Amlodipine Other (See Comments)    edema    Consultations: Orthopedics.  Neurology.   Procedures/Studies: Dg Chest 2 View  12/04/2015  CLINICAL DATA:  Wheezing. EXAM: CHEST   2 VIEW COMPARISON:  Nov 23, 2015. FINDINGS: Stable cardiomediastinal silhouette. Status post coronary artery bypass graft. No pneumothorax or pleural effusion is noted. No acute pulmonary disease is noted. Bony thorax is unremarkable. IMPRESSION: No active cardiopulmonary disease. Electronically Signed   By: Marijo Conception, M.D.   On: 12/04/2015 20:26   Dg Thoracic Spine W/swimmers  12/21/2015  CLINICAL DATA:  Started to fall.  Back pain.  Initial encounter. EXAM: THORACIC SPINE - 3 VIEWS COMPARISON:  Two-view chest x-ray 12/04/2015 FINDINGS: There is no evidence of thoracic spine fracture. Mildly exaggerated kyphosis with diffuse degenerative disc narrowing and endplate spurring. Osteopenia. IMPRESSION: No acute finding. Electronically Signed   By: Monte Fantasia M.D.   On: 12/21/2015 01:42   Dg Lumbar Spine 2-3 Views  12/21/2015  CLINICAL DATA:  Started to fall.  Low back pain.  Initial encounter. EXAM: LUMBAR SPINE - 2-3 VIEW COMPARISON:  12/01/2015 FINDINGS: Normal lumbar segmentation. No convincing fracture. Differential appearance of the superior endplate of L5 is likely related to obliquity of imaging. No traumatic malalignment. Multilevel spondylosis and disc degeneration with narrowing and spurring greatest at L2-3. Extensive atherosclerotic calcification. Osteopenia. IMPRESSION: No convincing fracture. Electronically Signed   By: Monte Fantasia M.D.   On: 12/21/2015 01:41   Dg Lumbar Spine 2-3 Views  12/01/2015  CLINICAL DATA:  Portable imaging of the lumbar spine for lumbar spine surgical localization. EXAM: LUMBAR SPINE - 2-3 VIEW COMPARISON:  Lumbar MRI, 10/14/2015 FINDINGS: Initial image shows  placement of a surgical needle posterior to the L4-L5 disc. Subsequent image shows placement of a surgical insert it between posterior skin retractors. The tip projects 3 cm posterior to the posterior margin of the L5-S1 disc. IMPRESSION: Surgical localization imaging as described. Electronically  Signed   By: Lajean Manes M.D.   On: 12/01/2015 11:02   Dg Elbow 2 Views Right  12/09/2015  CLINICAL DATA:  Posterior elbow pain and swelling. EXAM: RIGHT ELBOW - 2 VIEW COMPARISON:  None. FINDINGS: There is no evidence of fracture, dislocation, or joint effusion. There is no evidence of arthropathy or other focal bone abnormality. Dystrophic calcification at the origin of the right common extensor tendon origin. There is peripheral vascular atherosclerotic disease. IMPRESSION: No acute osseous injury of the right elbow. Electronically Signed   By: Kathreen Devoid   On: 12/09/2015 11:50   Dg Knee 1-2 Views Left  12/21/2015  CLINICAL DATA:  Fall, left knee swelling EXAM: LEFT KNEE - 1-2 VIEW COMPARISON:  None. FINDINGS: No fracture or dislocation is seen. Mild tricompartmental degenerative changes with chondrocalcinosis in the medial and lateral compartments. Visualized soft tissues are within normal limits, noting vascular calcifications. No suprapatellar knee joint effusion. IMPRESSION: No fracture or dislocation is seen. Mild degenerative changes with chondrocalcinosis. Electronically Signed   By: Julian Hy M.D.   On: 12/21/2015 21:20   Dg Ankle 2 Views Left  12/21/2015  CLINICAL DATA:  Fall, ankle swelling EXAM: LEFT ANKLE - 2 VIEW COMPARISON:  None. FINDINGS: Evaluation is mildly constrained by obliquity. No fracture or dislocation is seen. The ankle mortise is intact. Possible mild medial soft tissue swelling. Vascular calcifications. IMPRESSION: No fracture or dislocation is seen. Electronically Signed   By: Julian Hy M.D.   On: 12/21/2015 21:22   Dg Abd 1 View  12/04/2015  CLINICAL DATA:  Abdominal pain. EXAM: ABDOMEN - 1 VIEW COMPARISON:  None. FINDINGS: The bowel gas pattern is normal. No radio-opaque calculi or other significant radiographic abnormality are seen. IMPRESSION: No evidence of bowel obstruction or ileus. Electronically Signed   By: Marijo Conception, M.D.   On: 12/04/2015  20:28   Ct Head Wo Contrast  12/21/2015  CLINICAL DATA:  Fall with occipital scalp injury. Initial encounter. EXAM: CT HEAD WITHOUT CONTRAST CT CERVICAL SPINE WITHOUT CONTRAST TECHNIQUE: Multidetector CT imaging of the head and cervical spine was performed following the standard protocol without intravenous contrast. Multiplanar CT image reconstructions of the cervical spine were also generated. COMPARISON:  None. FINDINGS: CT HEAD FINDINGS Skull and Sinuses:Negative for fracture or destructive process. The visualized mastoids, middle ears, and imaged paranasal sinuses are clear. Visualized orbits: Negative. Brain: No evidence of acute infarction, hemorrhage, hydrocephalus, or mass lesion/mass effect. Generalized atrophy, mild to moderate for age. CT CERVICAL SPINE FINDINGS Negative for acute fracture or subluxation. No prevertebral edema. No gross cervical canal hematoma. Advanced and diffuse disc and facet degeneration. C4-5 ACDF or ankylosis. Posterior endplate ridging narrows the spinal canal at multiple levels. Slight anterolisthesis at C7-T1, facet mediated. IMPRESSION: No evidence of acute intracranial or cervical spine injury. Electronically Signed   By: Monte Fantasia M.D.   On: 12/21/2015 07:15   Ct Cervical Spine Wo Contrast  12/21/2015  CLINICAL DATA:  Fall with occipital scalp injury. Initial encounter. EXAM: CT HEAD WITHOUT CONTRAST CT CERVICAL SPINE WITHOUT CONTRAST TECHNIQUE: Multidetector CT imaging of the head and cervical spine was performed following the standard protocol without intravenous contrast. Multiplanar CT image reconstructions of the cervical  spine were also generated. COMPARISON:  None. FINDINGS: CT HEAD FINDINGS Skull and Sinuses:Negative for fracture or destructive process. The visualized mastoids, middle ears, and imaged paranasal sinuses are clear. Visualized orbits: Negative. Brain: No evidence of acute infarction, hemorrhage, hydrocephalus, or mass lesion/mass effect.  Generalized atrophy, mild to moderate for age. CT CERVICAL SPINE FINDINGS Negative for acute fracture or subluxation. No prevertebral edema. No gross cervical canal hematoma. Advanced and diffuse disc and facet degeneration. C4-5 ACDF or ankylosis. Posterior endplate ridging narrows the spinal canal at multiple levels. Slight anterolisthesis at C7-T1, facet mediated. IMPRESSION: No evidence of acute intracranial or cervical spine injury. Electronically Signed   By: Monte Fantasia M.D.   On: 12/21/2015 07:15   Mr Lumbar Spine Wo Contrast  12/21/2015  CLINICAL DATA:  46 -year-old female with recurrent lumbar back pain after recent fall. Generalized weakness. Lower extremity muscle spasms. Initial encounter. EXAM: MRI LUMBAR SPINE WITHOUT CONTRAST TECHNIQUE: Multiplanar, multisequence MR imaging of the lumbar spine was performed. No intravenous contrast was administered. COMPARISON:  Lumbar radiographs 0045 hours today. Lumbar MRI for 117. FINDINGS: Study is intermittently degraded by motion artifact despite repeated imaging attempts. Segmentation: Normal as demonstrated on today radiographs common this is the same numbering system used on the prior MRI. Alignment: Stable since April. No compression fracture. No definite spondylolisthesis. Vertebrae: Postoperative changes to the bilateral posterior elements at L5-S1, see details below. No superimposed acute osseous abnormality. T1 bone marrow signal appears stable and within normal limits. Conus medullaris: Extends to the L1-L2 level and appears normal. No lower thoracic spinal cord signal abnormality. Paraspinal and other soft tissues: Stable and negative visualized abdominal viscera. Postoperative changes to the posterior paraspinal soft tissues at L4, L5, and S1. Subcutaneous edema, including at the skin incision site to the left of midline at L4-L5 (series 5, image 10). This fluid does not appear organized or drainable. Mild fluid or edema along the  surgical approach is through the posterior elements at L5-S1, greater on the right (series 5, image 4). See additional details of that level below. Disc levels: The levels T10-T11 through L3-L4 appear stable and largely unremarkable for age. L4-L5: Stable mostly far lateral disc osteophyte complex. Stable posterior elements. No spinal, lateral recess, or significant foraminal stenosis. L5-S1: Postoperative changes tracking from the bilateral posterior elements to the bilateral lateral recesses best seen on series 6, images 34 and 35. Architectural distortion at the lateral recesses, more so the right. Underlying disc space loss with bulky circumferential disc osteophyte complex and moderate bilateral facet hypertrophy. Mildly decreased thecal sac patency compared to the preoperative study (series 6, image 35). Previously-seen moderate to severe bilateral L5 foraminal stenosis does not appear significantly changed. IMPRESSION: 1. Postoperative changes bilaterally at L5-S1, greater on the right and with associated lateral recess architectural distortion. Mildly decreased thecal sac patency compared to the preoperative study in April. 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 2. Posterior paraspinal soft tissue edema without organized or drainable fluid collection. 3. Other lumbar levels are stable since April with no other lumbar spinal stenosis. Electronically Signed   By: Genevie Ann M.D.   On: 12/21/2015 10:40   US Renal  12/07/2015  CLINICAL DATA:  75 year old female with acute kidney injury on chronic kidney disease. EXAM: RENAL / URINARY TRACT ULTRASOUND COMPLETE COMPARISON:  None. FINDINGS: Right Kidney: Length: 12 cm. Cortical atrophy noted with increased renal echogenicity. There is no evidence of hydronephrosis or solid mass. A 1.2 cm cyst is present.  Left Kidney: Length: 10.6 cm. Cortical atrophy noted with increased renal echogenicity. There is no evidence of hydronephrosis or  solid mass. Bladder: A Foley catheter is present within a collapsed bladder. IMPRESSION: Bilateral renal cortical atrophy and increased renal echogenicity compatible with medical renal disease. No evidence of hydronephrosis. Electronically Signed   By: Margarette Canada M.D.   On: 12/07/2015 16:45   Dg Foot Complete Left  12/21/2015  CLINICAL DATA:  Fall, ankle swelling EXAM: LEFT FOOT - COMPLETE 3+ VIEW COMPARISON:  None. FINDINGS: No fracture or dislocation is seen. Mild degenerative changes at the MTP joint. Mild soft tissue swelling along the dorsal forefoot. Small plantar and posterior calcaneal enthesophytes. Vascular calcifications. IMPRESSION: No fracture or dislocation is seen. Electronically Signed   By: Julian Hy M.D.   On: 12/21/2015 21:21       Subjective: No new complaints.   Discharge Exam: Filed Vitals:   12/25/15 0520 12/25/15 0944  BP: 179/81 151/60  Pulse: 88 61  Temp: 97.7 F (36.5 C)   Resp: 16    Filed Vitals:   12/24/15 2117 12/25/15 0520 12/25/15 0700 12/25/15 0944  BP: 166/48 179/81  151/60  Pulse: 77 88  61  Temp: 98 F (36.7 C) 97.7 F (36.5 C)    TempSrc:  Oral    Resp: 18 16    Height:      Weight:   85.31 kg (188 lb 1.2 oz)   SpO2: 96% 96%      General: Pt is alert, awake, not in acute distress Cardiovascular: RRR, S1/S2 +, no rubs, no gallops Respiratory: CTA bilaterally, no wheezing, no rhonchi Abdominal: Soft, NT, ND, bowel sounds + Extremities: no edema, no cyanosis    The results of significant diagnostics from this hospitalization (including imaging, microbiology, ancillary and laboratory) are listed below for reference.     Microbiology: Recent Results (from the past 240 hour(s))  Urine culture     Status: Abnormal   Collection Time: 12/21/15 10:22 PM  Result Value Ref Range Status   Specimen Description URINE, CLEAN CATCH  Final   Special Requests NONE  Final   Culture >=100,000 COLONIES/mL PSEUDOMONAS AERUGINOSA (A)  Final    Report Status 12/24/2015 FINAL  Final   Organism ID, Bacteria PSEUDOMONAS AERUGINOSA (A)  Final      Susceptibility   Pseudomonas aeruginosa - MIC*    CEFTAZIDIME 4 SENSITIVE Sensitive     CIPROFLOXACIN 0.5 SENSITIVE Sensitive     GENTAMICIN 8 INTERMEDIATE Intermediate     IMIPENEM 2 SENSITIVE Sensitive     PIP/TAZO 8 SENSITIVE Sensitive     CEFEPIME 8 SENSITIVE Sensitive     * >=100,000 COLONIES/mL PSEUDOMONAS AERUGINOSA     Labs: BNP (last 3 results)  Recent Labs  12/21/15 0445  BNP [id number]   Basic Metabolic Panel:  Recent Labs Lab 12/21/15 0445 12/21/15 1803 12/22/15 0554 12/24/15 0552  NA 142 141 143 139  K 3.0* 5.1 3.7 4.9  CL 105 107 107 105  CO2 27 24 25  21*  GLUCOSE 125* 106* 101* 127*  BUN 40* 40* 38* 53*  CREATININE 2.15* 2.14* 2.14* 2.86*  CALCIUM 8.6* 8.5* 8.7* 8.2*  MG  --  1.3*  --   --   PHOS  --  3.3  --   --    Liver Function Tests: No results for input(s): AST, ALT, ALKPHOS, BILITOT, PROT, ALBUMIN in the last 168 hours. No results for input(s): LIPASE, AMYLASE in the last 168 hours.  Recent Labs Lab 12/22/15 2058  AMMONIA 34   CBC:  Recent Labs Lab 12/21/15 0445 12/22/15 0554 12/24/15 0552  WBC 8.8 13.6* 6.8  NEUTROABS 6.3  --   --   HGB 10.9* 11.9* 10.0*  HCT 35.0* 37.7 31.8*  MCV 97.8 99.5 97.8  PLT 189 191 134*   Cardiac Enzymes: No results for input(s): CKTOTAL, CKMB, CKMBINDEX, TROPONINI in the last 168 hours. BNP: Invalid input(s): POCBNP CBG:  Recent Labs Lab 12/24/15 1220 12/24/15 1729 12/24/15 2112 12/25/15 0805 12/25/15 1158  GLUCAP 132* 158* 165* 92 141*   D-Dimer No results for input(s): DDIMER in the last 72 hours. Hgb A1c No results for input(s): HGBA1C in the last 72 hours. Lipid Profile No results for input(s): CHOL, HDL, LDLCALC, TRIG, CHOLHDL, LDLDIRECT in the last 72 hours. Thyroid function studies No results for input(s): TSH, T4TOTAL, T3FREE, THYROIDAB in the last 72 hours.  Invalid  input(s): FREET3 Anemia work up No results for input(s): VITAMINB12, FOLATE, FERRITIN, TIBC, IRON, RETICCTPCT in the last 72 hours. Urinalysis    Component Value Date/Time   COLORURINE YELLOW 12/21/2015 2222   APPEARANCEUR CLEAR 12/21/2015 2222   LABSPEC 1.013 12/21/2015 2222   PHURINE 6.5 12/21/2015 2222   GLUCOSEU NEGATIVE 12/21/2015 2222   HGBUR MODERATE* 12/21/2015 2222   BILIRUBINUR NEGATIVE 12/21/2015 2222   KETONESUR NEGATIVE 12/21/2015 2222   PROTEINUR >300* 12/21/2015 2222   UROBILINOGEN 0.2 04/19/2015 1248   NITRITE POSITIVE* 12/21/2015 2222   LEUKOCYTESUR TRACE* 12/21/2015 2222   Sepsis Labs Invalid input(s): PROCALCITONIN,  WBC,  LACTICIDVEN Microbiology Recent Results (from the past 240 hour(s))  Urine culture     Status: Abnormal   Collection Time: 12/21/15 10:22 PM  Result Value Ref Range Status   Specimen Description URINE, CLEAN CATCH  Final   Special Requests NONE  Final   Culture >=100,000 COLONIES/mL PSEUDOMONAS AERUGINOSA (A)  Final   Report Status 12/24/2015 FINAL  Final   Organism ID, Bacteria PSEUDOMONAS AERUGINOSA (A)  Final      Susceptibility   Pseudomonas aeruginosa - MIC*    CEFTAZIDIME 4 SENSITIVE Sensitive     CIPROFLOXACIN 0.5 SENSITIVE Sensitive     GENTAMICIN 8 INTERMEDIATE Intermediate     IMIPENEM 2 SENSITIVE Sensitive     PIP/TAZO 8 SENSITIVE Sensitive     CEFEPIME 8 SENSITIVE Sensitive     * >=100,000 COLONIES/mL PSEUDOMONAS AERUGINOSA     Time coordinating discharge: Over 30 minutes  SIGNED:   Hosie Poisson, MD  [facility] 12/25/2015, 7:56 PM Pager  [phone] If 7PM-7AM, please contact night-coverage [email or web address] Password [email or web address]

## 2015-12-25 NOTE — Progress Notes (Signed)
Occupational Therapy Treatment Patient Details Name: [name] MRN: [id number] DOB: [birth year] Today's Date: 12/25/2015    History of present illness 75 y.o. female admitted to [facility] on 12/20/15 for weakness.  Pt fell at home assisted to the floor by her granddaughter.  MRI of spine indicated no abnormalities at the low back.  Vascular surgery consulted to r/o claudication.  Medical workup in progress.  Pt with significant PMhx of a-fib, CAD, CEA, HOH, stroke, CKD, DM, MI, multiple falls, neck surgery, CABG, L knee surgery, and lumbar laminectomy 12/01/15.     OT comments  Pt making good progress. Family available and participated in session. Pt safe to D?C home with initial 24/7 S and HHOT to facilitate return to PLOF. Reviewed handouts regarding reducing risk of falls with pt/family. Pt very appreciative.   Follow Up Recommendations  Home health OT;Supervision/Assistance - 24 hour    Equipment Recommendations  None recommended by OT    Recommendations for Other Services      Precautions / Restrictions Precautions Precautions: Back;Fall Restrictions Weight Bearing Restrictions: No       Mobility Bed Mobility               General bed mobility comments: see above. Able to complete bed mobilty using long sitting technique after demonstration  Transfers   Equipment used: Rolling walker (2 wheeled) Transfers: Risk manager;Sit to/from Stand Sit to Stand: Supervision Stand pivot transfers: Supervision            Balance             Standing balance-Leahy Scale: Fair                     ADL                                         General ADL Comments: Completing functional mobility for ADL with S @ RW level. Pt states she has returned to only requiring set up with ADL. Pt having difficulty getting out/in bed, however, pt has adjustable bed at home. Demonstrated how to use bed to increase her independence. Pt able to return  demonstrate.       Vision                     Perception     Praxis      Cognition   Behavior During Therapy: WFL for tasks assessed/performed Overall Cognitive Status: Within Functional Limits for tasks assessed                       Extremity/Trunk Assessment               Exercises     Shoulder Instructions       General Comments      Pertinent Vitals/ Pain       Pain Assessment: No/denies pain  Home Living                                          Prior Functioning/Environment              Frequency Min 2X/week     Progress Toward Goals  OT Goals(current goals can now be found in the care plan section)  Progress towards OT goals: Progressing toward goals  Acute Rehab OT Goals Patient Stated Goal: to go home OT Goal Formulation: With patient Time For Goal Achievement: 01/06/16 Potential to Achieve Goals: Good ADL Goals Pt Will Perform Grooming: with supervision;standing Pt Will Perform Upper Body Bathing: with supervision;sitting Pt Will Perform Lower Body Bathing: with supervision;sit to/from stand Pt Will Perform Lower Body Dressing: with supervision;with adaptive equipment;sit to/from stand Pt Will Transfer to Toilet: with supervision;ambulating;bedside commode Pt Will Perform Toileting - Clothing Manipulation and hygiene: with supervision;sit to/from stand Pt Will Perform Tub/Shower Transfer: Shower transfer;with supervision;ambulating;shower seat;rolling walker Pt/caregiver will Perform Home Exercise Program: Increased strength;Both right and left upper extremity;With theraband;With written HEP provided;Independently Additional ADL Goal #1: Pt will independently verbalize 3 energy conservation strateiges for increased safety with ADLs and functional mobility.  Plan Discharge plan remains appropriate;Frequency remains appropriate    Co-evaluation                 End of Session Equipment Utilized  During Treatment: Gait belt;Rolling walker   Activity Tolerance Patient tolerated treatment well   Patient Left in chair;with call bell/phone within reach;with family/visitor present   Nurse Communication Mobility status        Time: [id number] OT Time Calculation (min): 17 min  Charges: OT General Charges $OT Visit: 1 Procedure OT Treatments $Self Care/Home Management : 8-22 mins  Kasten Leveque,HILLARY 12/25/2015, 11:25 AM   Maurie Boettcher, OTR/L  [phone] 12/25/2015

## 2015-12-25 NOTE — Progress Notes (Signed)
[name] to be D/Espinoza'd Home per MD order.  Discussed with the patient and all questions fully answered.  VSS, Skin clean, dry and intact without evidence of skin break down, no evidence of skin tears noted. IV catheter discontinued intact. Site without signs and symptoms of complications. Dressing and pressure applied.  An After Visit Summary was printed and given to the patient. Patient received prescription.  D/Espinoza education completed with patient/family including follow up instructions, medication list, d/Espinoza activities limitations if indicated, with other d/Espinoza instructions as indicated by MD - patient able to verbalize understanding, all questions fully answered.   Patient instructed to return to ED, call 911, or call MD for any changes in condition.   Patient to be escorted via [place], and D/Espinoza home via private auto.  [name], [name] 12/25/2015 12:35 PM

## 2015-12-25 NOTE — Care Management Important Message (Signed)
Important Message  Patient Details  Name: [name] MRN: [id number] Date of Birth: [birth year]   Medicare Important Message Given:  Yes    Nathen May 12/25/2015, 11:16 AM

## 2015-12-25 NOTE — Care Management Note (Addendum)
Case Management Note  Patient Details  Name: [name] MRN: [id number] Date of Birth: 11/19/1940  Subjective/Objective:                 Pt with  Past Medical History of anemiua, glaucoma, HTN, GERD, Afib, CAD, SOB, carotid stenosis, CVA, CKD, MI who presents with progressive weakness and odd tremor. She was found to have UTI. From with husband ,BENETA WEICHMAN (Spouse)  [phone]. PCP: Josetta Huddle.    Action/Plan: Plan is to d/c to home today with home health services.  Expected Discharge Date:                  Expected Discharge Plan:  [place]  In-House Referral:     Discharge planning Services  CM Consult  Post Acute Care Choice:  Resumption of Svcs/PTA Provider Choice offered to:  Patient  DME Arranged:    DME Agency:    [place] Arranged:  PT/OT HH Agency:  [place]  Status of Service:  Completed, signed off  Whitman Hero RN,BSN,CM [id number] 12/25/2015, 10:40 AM

## 2015-12-25 NOTE — Progress Notes (Signed)
Patient's daughter asking about getting her hemoglobin checked because patient goes to short stay every few weeks to get it checked. Short Stay contacted and stated that if patient's hemoglobin is less than 12 they give her procrit 20000 units. Dr. Karleen Hampshire made aware

## 2015-12-26 ENCOUNTER — Other Ambulatory Visit: Payer: Self-pay | Admitting: *Deleted

## 2015-12-26 DIAGNOSIS — E1122 Type 2 diabetes mellitus with diabetic chronic kidney disease: Secondary | ICD-10-CM | POA: Diagnosis not present

## 2015-12-26 DIAGNOSIS — Z4789 Encounter for other orthopedic aftercare: Secondary | ICD-10-CM | POA: Diagnosis not present

## 2015-12-26 DIAGNOSIS — I120 Hypertensive chronic kidney disease with stage 5 chronic kidney disease or end stage renal disease: Secondary | ICD-10-CM | POA: Diagnosis not present

## 2015-12-26 DIAGNOSIS — N185 Chronic kidney disease, stage 5: Secondary | ICD-10-CM | POA: Diagnosis not present

## 2015-12-26 DIAGNOSIS — F329 Major depressive disorder, single episode, unspecified: Secondary | ICD-10-CM | POA: Diagnosis not present

## 2015-12-26 DIAGNOSIS — I2510 Atherosclerotic heart disease of native coronary artery without angina pectoris: Secondary | ICD-10-CM | POA: Diagnosis not present

## 2015-12-26 NOTE — Patient Outreach (Signed)
12/26/15- Pt discharged from hospital 12/25/15- RN CM called patient's home for transition of care week 1, patient's daughter Mimmi answered the phone and states " mom is asleep right now and is exhausted, you'll need to call back another day".  PLAN RN CM to call pt tomorrow for transition of care week [facility], [place] Coordinator [phone]

## 2015-12-27 ENCOUNTER — Other Ambulatory Visit: Payer: Self-pay | Admitting: *Deleted

## 2015-12-27 ENCOUNTER — Encounter: Payer: Medicare Other | Admitting: Physical Medicine & Rehabilitation

## 2015-12-27 ENCOUNTER — Ambulatory Visit: Payer: Medicare Other

## 2015-12-27 NOTE — Patient Outreach (Signed)
12/27/15- Telephone call to pt for transition of care week 1/ 2nd attempt, called patient's home number and no answer to telephone, no option to leave voicemail, telephone call to cell phone and received message that states " voicemail not set up"  PLAN Attempt to reach pt tomorrow, if unsuccessful, mail letter to pt home.  Jacqlyn Larsen [facility], [place] Coordinator [phone]

## 2015-12-28 ENCOUNTER — Encounter: Payer: Self-pay | Admitting: *Deleted

## 2015-12-28 ENCOUNTER — Other Ambulatory Visit: Payer: Self-pay | Admitting: *Deleted

## 2015-12-28 DIAGNOSIS — E1122 Type 2 diabetes mellitus with diabetic chronic kidney disease: Secondary | ICD-10-CM | POA: Diagnosis not present

## 2015-12-28 DIAGNOSIS — N185 Chronic kidney disease, stage 5: Secondary | ICD-10-CM | POA: Diagnosis not present

## 2015-12-28 DIAGNOSIS — I2510 Atherosclerotic heart disease of native coronary artery without angina pectoris: Secondary | ICD-10-CM | POA: Diagnosis not present

## 2015-12-28 DIAGNOSIS — I120 Hypertensive chronic kidney disease with stage 5 chronic kidney disease or end stage renal disease: Secondary | ICD-10-CM | POA: Diagnosis not present

## 2015-12-28 DIAGNOSIS — Z4789 Encounter for other orthopedic aftercare: Secondary | ICD-10-CM | POA: Diagnosis not present

## 2015-12-28 DIAGNOSIS — F329 Major depressive disorder, single episode, unspecified: Secondary | ICD-10-CM | POA: Diagnosis not present

## 2015-12-28 NOTE — Patient Outreach (Signed)
12/28/15- Telephone call to pt for transition of care week 1/ 3rd attempt, spoke with pt and permission given to speak with granddaughter Elmore Guise, per Jonelle Sidle pt has good family support and assistance with medication management. Family provides transportation, pt currently on antibiotic for urinary tract infection and was instructed not to take medication for gout while on antibiotic. Home health RN will be obtaining urine specimen.  Pt checks CBG and spouse administers insulin.  RN CthnM reviewed 24 hour nurse line and calling early for change in health status or symptoms, reviewed other resources to call such as home health, MD.  CBG today 142. RN CM faxed barrier letter and transition of care note to primary MD Dr. Josetta Huddle. THN CM Care Plan Problem One        Most Recent Value   Care Plan Problem One  Knowledge deficit related to diabetes   Role Documenting the Problem One  Care Management Coordinator   THN Long Term Goal (31-90 days)  pt will have better understanding of diabetes within 90 days aeb improvement in blood sugar , Hgb AIC   THN Long Term Goal Start Date  12/28/15   Interventions for Problem One Long Term Goal  RN CM reviewed being mindful of foods high in carbohydrates and limiting carb choices to approximately 3 per meal.   THN CM Short Term Goal #1 (0-30 days)  pt will check CBG TID and log within 30 days.   THN CM Short Term Goal #1 Start Date  12/28/15   Interventions for Short Term Goal #1  RN CM reviewed importance of checking CBG consistenly everyday and recording in log    [facility] CM Care Plan Problem Two        Most Recent Value   Care Plan Problem Two  Knowledge deficit related to urinary tract infection   Role Documenting the Problem Two  Care Management Coordinator   Care Plan for Problem Two  Active   THN CM Short Term Goal #1 (0-30 days)  pt will verbalize ways to decrease urinary tract infection within 30 days.   THN CM Short Term Goal #1 Start Date  12/28/15    Interventions for Short Term Goal #2   RN CM reviewed signs/ symptoms urinary tract infection and importance of calling MD for early intervention, reviewed correlation of infection and elevated CBG.      PLAN  continue weekly transition of care calls See pt for initial home visit 01/04/16  Jacqlyn Larsen [facility], [place] Coordinator [phone]

## 2016-01-01 DIAGNOSIS — I120 Hypertensive chronic kidney disease with stage 5 chronic kidney disease or end stage renal disease: Secondary | ICD-10-CM | POA: Diagnosis not present

## 2016-01-01 DIAGNOSIS — N185 Chronic kidney disease, stage 5: Secondary | ICD-10-CM | POA: Diagnosis not present

## 2016-01-01 DIAGNOSIS — Z4789 Encounter for other orthopedic aftercare: Secondary | ICD-10-CM | POA: Diagnosis not present

## 2016-01-01 DIAGNOSIS — I2510 Atherosclerotic heart disease of native coronary artery without angina pectoris: Secondary | ICD-10-CM | POA: Diagnosis not present

## 2016-01-01 DIAGNOSIS — E1122 Type 2 diabetes mellitus with diabetic chronic kidney disease: Secondary | ICD-10-CM | POA: Diagnosis not present

## 2016-01-01 DIAGNOSIS — F329 Major depressive disorder, single episode, unspecified: Secondary | ICD-10-CM | POA: Diagnosis not present

## 2016-01-02 DIAGNOSIS — F329 Major depressive disorder, single episode, unspecified: Secondary | ICD-10-CM | POA: Diagnosis not present

## 2016-01-02 DIAGNOSIS — I2510 Atherosclerotic heart disease of native coronary artery without angina pectoris: Secondary | ICD-10-CM | POA: Diagnosis not present

## 2016-01-02 DIAGNOSIS — E1122 Type 2 diabetes mellitus with diabetic chronic kidney disease: Secondary | ICD-10-CM | POA: Diagnosis not present

## 2016-01-02 DIAGNOSIS — I120 Hypertensive chronic kidney disease with stage 5 chronic kidney disease or end stage renal disease: Secondary | ICD-10-CM | POA: Diagnosis not present

## 2016-01-02 DIAGNOSIS — N185 Chronic kidney disease, stage 5: Secondary | ICD-10-CM | POA: Diagnosis not present

## 2016-01-02 DIAGNOSIS — Z4789 Encounter for other orthopedic aftercare: Secondary | ICD-10-CM | POA: Diagnosis not present

## 2016-01-03 ENCOUNTER — Encounter: Payer: Self-pay | Admitting: Physical Medicine & Rehabilitation

## 2016-01-03 ENCOUNTER — Encounter: Payer: Medicare Other | Attending: Physical Medicine & Rehabilitation | Admitting: Physical Medicine & Rehabilitation

## 2016-01-03 VITALS — BP 178/68 | HR 67 | Resp 14

## 2016-01-03 DIAGNOSIS — I6529 Occlusion and stenosis of unspecified carotid artery: Secondary | ICD-10-CM | POA: Diagnosis not present

## 2016-01-03 DIAGNOSIS — M7989 Other specified soft tissue disorders: Secondary | ICD-10-CM | POA: Insufficient documentation

## 2016-01-03 DIAGNOSIS — Z85820 Personal history of malignant melanoma of skin: Secondary | ICD-10-CM | POA: Diagnosis not present

## 2016-01-03 DIAGNOSIS — G7089 Other specified myoneural disorders: Secondary | ICD-10-CM | POA: Diagnosis not present

## 2016-01-03 DIAGNOSIS — I252 Old myocardial infarction: Secondary | ICD-10-CM | POA: Diagnosis not present

## 2016-01-03 DIAGNOSIS — H409 Unspecified glaucoma: Secondary | ICD-10-CM | POA: Diagnosis not present

## 2016-01-03 DIAGNOSIS — M7062 Trochanteric bursitis, left hip: Secondary | ICD-10-CM | POA: Insufficient documentation

## 2016-01-03 DIAGNOSIS — Z8701 Personal history of pneumonia (recurrent): Secondary | ICD-10-CM | POA: Diagnosis not present

## 2016-01-03 DIAGNOSIS — K219 Gastro-esophageal reflux disease without esophagitis: Secondary | ICD-10-CM | POA: Diagnosis not present

## 2016-01-03 DIAGNOSIS — F329 Major depressive disorder, single episode, unspecified: Secondary | ICD-10-CM | POA: Insufficient documentation

## 2016-01-03 DIAGNOSIS — Z8673 Personal history of transient ischemic attack (TIA), and cerebral infarction without residual deficits: Secondary | ICD-10-CM | POA: Insufficient documentation

## 2016-01-03 DIAGNOSIS — Z87442 Personal history of urinary calculi: Secondary | ICD-10-CM | POA: Diagnosis not present

## 2016-01-03 DIAGNOSIS — N189 Chronic kidney disease, unspecified: Secondary | ICD-10-CM | POA: Diagnosis not present

## 2016-01-03 DIAGNOSIS — E876 Hypokalemia: Secondary | ICD-10-CM | POA: Insufficient documentation

## 2016-01-03 DIAGNOSIS — Z951 Presence of aortocoronary bypass graft: Secondary | ICD-10-CM | POA: Diagnosis not present

## 2016-01-03 DIAGNOSIS — R269 Unspecified abnormalities of gait and mobility: Secondary | ICD-10-CM | POA: Insufficient documentation

## 2016-01-03 DIAGNOSIS — N184 Chronic kidney disease, stage 4 (severe): Secondary | ICD-10-CM | POA: Insufficient documentation

## 2016-01-03 DIAGNOSIS — E1122 Type 2 diabetes mellitus with diabetic chronic kidney disease: Secondary | ICD-10-CM | POA: Insufficient documentation

## 2016-01-03 DIAGNOSIS — I1310 Hypertensive heart and chronic kidney disease without heart failure, with stage 1 through stage 4 chronic kidney disease, or unspecified chronic kidney disease: Secondary | ICD-10-CM | POA: Diagnosis not present

## 2016-01-03 DIAGNOSIS — M1990 Unspecified osteoarthritis, unspecified site: Secondary | ICD-10-CM | POA: Diagnosis not present

## 2016-01-03 DIAGNOSIS — K449 Diaphragmatic hernia without obstruction or gangrene: Secondary | ICD-10-CM | POA: Insufficient documentation

## 2016-01-03 DIAGNOSIS — I2510 Atherosclerotic heart disease of native coronary artery without angina pectoris: Secondary | ICD-10-CM | POA: Insufficient documentation

## 2016-01-03 DIAGNOSIS — D631 Anemia in chronic kidney disease: Secondary | ICD-10-CM

## 2016-01-03 DIAGNOSIS — I4891 Unspecified atrial fibrillation: Secondary | ICD-10-CM | POA: Insufficient documentation

## 2016-01-03 DIAGNOSIS — M5417 Radiculopathy, lumbosacral region: Secondary | ICD-10-CM | POA: Diagnosis not present

## 2016-01-03 DIAGNOSIS — M5416 Radiculopathy, lumbar region: Secondary | ICD-10-CM

## 2016-01-03 DIAGNOSIS — Z8739 Personal history of other diseases of the musculoskeletal system and connective tissue: Secondary | ICD-10-CM

## 2016-01-03 DIAGNOSIS — R0602 Shortness of breath: Secondary | ICD-10-CM | POA: Diagnosis not present

## 2016-01-03 DIAGNOSIS — Z9889 Other specified postprocedural states: Secondary | ICD-10-CM | POA: Insufficient documentation

## 2016-01-03 DIAGNOSIS — N390 Urinary tract infection, site not specified: Secondary | ICD-10-CM | POA: Insufficient documentation

## 2016-01-03 DIAGNOSIS — Z8639 Personal history of other endocrine, nutritional and metabolic disease: Secondary | ICD-10-CM | POA: Diagnosis not present

## 2016-01-03 NOTE — Patient Instructions (Signed)
CONSIDER TART CHERRY EXTRACT, APPLE CIDER VINEGAR FOR GOUT CONTROL   Trochanteric Bursitis You have hip pain due to trochanteric bursitis. Bursitis means that the sack near the outside of the hip is filled with fluid and inflamed. This sack is made up of protective soft tissue. The pain from trochanteric bursitis can be severe and keep you from sleep. It can radiate to the buttocks or down the outside of the thigh to the knee. The pain is almost always worse when rising from the seated or lying position and with walking. Pain can improve after you take a few steps. It happens more often in people with hip joint and lumbar spine problems, such as arthritis or previous surgery. Very rarely the trochanteric bursa can become infected, and antibiotics and/or surgery may be needed. Treatment often includes an injection of local anesthetic mixed with cortisone medicine. This medicine is injected into the area where it is most tender over the hip. Repeat injections may be necessary if the response to treatment is slow. You can apply ice packs over the tender area for 30 minutes every 2 hours for the next few days. Anti-inflammatory and/or narcotic pain medicine may also be helpful. Limit your activity for the next few days if the pain continues. See your caregiver in 5-10 days if you are not greatly improved.  SEEK IMMEDIATE MEDICAL CARE IF:  You develop severe pain, fever, or increased redness.  You have pain that radiates below the knee. EXERCISES STRETCHING EXERCISES - Trochanteric Bursitis  These exercises may help you when beginning to rehabilitate your injury. Your symptoms may resolve with or without further involvement from your physician, physical therapist, or athletic trainer. While completing these exercises, remember:   Restoring tissue flexibility helps normal motion to return to the joints. This allows healthier, less painful movement and activity.  An effective stretch should be held for at  least 30 seconds.  A stretch should never be painful. You should only feel a gentle lengthening or release in the stretched tissue. STRETCH - Iliotibial Band  On the floor or bed, lie on your side so your injured leg is on top. Bend your knee and grab your ankle.  Slowly bring your knee back so that your thigh is in line with your trunk. Keep your heel at your buttocks and gently arch your back so your head, shoulders and hips line up.  Slowly lower your leg so that your knee approaches the floor/bed until you feel a gentle stretch on the outside of your thigh. If you do not feel a stretch and your knee will not fall farther, place the heel of your opposite foot on top of your knee and pull your thigh down farther.  Hold this stretch for __________ seconds.  Repeat __________ times. Complete this exercise __________ times per day. STRETCH - Hamstrings, Supine   Lie on your back. Loop a belt or towel over the ball of your foot as shown.  Straighten your knee and slowly pull on the belt to raise your injured leg. Do not allow the knee to bend. Keep your opposite leg flat on the floor.  Raise the leg until you feel a gentle stretch behind your knee or thigh. Hold this position for __________ seconds.  Repeat __________ times. Complete this stretch __________ times per day. STRETCH - Quadriceps, Prone   Lie on your stomach on a firm surface, such as a bed or padded floor.  Bend your knee and grasp your ankle. If you  are unable to reach your ankle or pant leg, use a belt around your foot to lengthen your reach.  Gently pull your heel toward your buttocks. Your knee should not slide out to the side. You should feel a stretch in the front of your thigh and/or knee.  Hold this position for __________ seconds.  Repeat __________ times. Complete this stretch __________ times per day. STRETCHING - Hip Flexors, Lunge Half kneel with your knee on the floor and your opposite knee bent and  directly over your ankle.  Keep good posture with your head over your shoulders. Tighten your buttocks to point your tailbone downward; this will prevent your back from arching too much.  You should feel a gentle stretch in the front of your thigh and/or hip. If you do not feel any resistance, slightly slide your opposite foot forward and then slowly lunge forward so your knee once again lines up over your ankle. Be sure your tailbone remains pointed downward.  Hold this stretch for __________ seconds.  Repeat __________ times. Complete this stretch __________ times per day. STRETCH - Adductors, Lunge  While standing, spread your legs.  Lean away from your injured leg by bending your opposite knee. You may rest your hands on your thigh for balance.  You should feel a stretch in your inner thigh. Hold for __________ seconds.  Repeat __________ times. Complete this exercise __________ times per day.   This information is not intended to replace advice given to you by your health care provider. Make sure you discuss any questions you have with your health care provider.   Document Released: 08/08/2004 Document Revised: 11/15/2014 Document Reviewed: 10/13/2008 Elsevier Interactive Patient Education [employer].

## 2016-01-03 NOTE — Progress Notes (Signed)
Subjective:    Patient ID: [name], female    DOB: Mar 26, 1941, 75 y.o.   MRN: [id number]  HPI   Marlesha is here in follow up of her lumbar spine surgery and associated pain syndrome. She was on rehab with [place] in late May. She returned to the hospital on 6/12 with a UTI and reaction to the tramadol she was taking. She is off all pain medication now except for tylenol. She is using voltaren gel for her left hip which remains painful. Therapy is coming to the house.    Pain Inventory Average Pain 6 Pain Right Now 7 My pain is sharp, dull and aching  In the last 24 hours, has pain interfered with the following? General activity 6 Relation with others 5 Enjoyment of life 7 What TIME of day is your pain at its worst? daytime Sleep (in general) Fair  Pain is worse with: walking, sitting, standing and some activites Pain improves with: rest, pacing activities and medication Relief from Meds: 6  Mobility walk with assistance use a walker ability to climb steps?  no do you drive?  no use a wheelchair needs help with transfers  Function retired I need assistance with the following:  meal prep and household duties  Neuro/Psych weakness tremor trouble walking spasms anxiety  Prior Studies x-rays CT/MRI  Physicians involved in your care Primary care [place]   Family History  Problem Relation Age of Onset  . Diabetes Mother   . Hypertension Mother   . Heart disease Mother     beofre age 35  . Heart attack Mother   . Stroke Mother   . Cancer Father   . Hyperlipidemia Father   . Hypertension Father   . Deep vein thrombosis Daughter   . Diabetes Daughter   . Hyperlipidemia Daughter   . Hypertension Daughter   . Heart disease Daughter   . Peripheral vascular disease Daughter    Social History   Social History  . Marital Status: Married    Spouse Name: N/A  . Number of Children: N/A  . Years of Education: N/A    Social History Main Topics  . Smoking status: Never Smoker   . Smokeless tobacco: Never Used  . Alcohol Use: No  . Drug Use: No  . Sexual Activity: No   Other Topics Concern  . None   Social History Narrative   Past Surgical History  Procedure Laterality Date  . Carpel tunnel    . Neck surgery    . Esophagogastroduodenoscopy  07/25/2011    Procedure: ESOPHAGOGASTRODUODENOSCOPY (EGD);  Surgeon: Winfield Cunas., MD;  [facility] ENDOSCOPY;  Service: Endoscopy;  Laterality: N/A;  . Colonoscopy  07/25/2011    Procedure: COLONOSCOPY;  Surgeon: Winfield Cunas., MD;  Location[facility] ENDOSCOPY;  Service: Endoscopy;  Laterality: N/A;  . Coronary artery bypass graft  07/29/2011    Procedure: CORONARY ARTERY BYPASS GRAFTING (CABG);  Surgeon: Gaye Pollack, MD;  Location: [place];  Service: Open Heart Surgery;  Laterality: N/A;  . Carotid endarterectomy Left 2009     CEA  . Av fistula placement Left 05/31/2014    Procedure: Creation of Left Arm arteriovenous brachiocephalic Fistula;  Surgeon: Angelia Mould, MD;  Location: [place];  Service: Vascular;  Laterality: Left;  . Fistulogram Left 08/29/2014    Procedure: FISTULOGRAM;  Surgeon: Angelia Mould, MD;  Location[facility] CATH LAB;  Service: Cardiovascular;  Laterality: Left;  .  Av fistula placement Left 09/01/2014    Procedure: INSERTION OF ARTERIOVENOUS (AV) GORE-TEX GRAFT LEFT UPPER ARM USING  4-7 MM X 45 CM SRTETCH GORETEX GRAFT;  Surgeon: Angelia Mould, MD;  Location: [place];  Service: Vascular;  Laterality: Left;  . Cardiac catheterization    . Tonsillectomy    . Eye surgery    . Endarterectomy Right 04/21/2015    Procedure: ENDARTERECTOMY CAROTID;  Surgeon: Angelia Mould, MD;  Location: [place];  Service: Vascular;  Laterality: Right;  . Knee surgery Left   . Lumbar laminectomy/decompression microdiscectomy N/A 12/01/2015    Procedure: L5-S1 Decompression and Bilateral Microdiscectomy;  Surgeon: Marybelle Killings,  MD;  Location: [place];  Service: Orthopedics;  Laterality: N/A;   Past Medical History  Diagnosis Date  . Anemia   . Glaucoma   . Hypertension   . Blood transfusion   . GERD (gastroesophageal reflux disease)   . Arthritis   . Neuromuscular disorder ([place])     CARPEL TUNNEL  . Atrial fibrillation ([place])   . CAD (coronary artery disease)   . Carotid artery occlusion     Carotid Endartectom,y - left 2009.  Blockage Right being watched by Dr Scot Dock.  Marland Kitchen HOH (hard of hearing)   . Shortness of breath   . Depression   . History of kidney stones     passed  . Carotid stenosis   . Complication of anesthesia     pt. states that she was difficult to wake  . Stroke ([place])     hx of TIA  . 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 Cancer ([place])     .  top of head- melonoma  . Dysrhythmia   . History of pneumonia   . Diabetes mellitus without complication ([place])   . Myocardial infarction ([place])   . History of hiatal hernia   . Pneumonia   . General weakness 12/2015  . Hypokalemia 12/2015   BP 178/68 mmHg  Pulse 67  Resp 14  SpO2 96%  Opioid Risk Score:   Fall Risk Score:  `1  Depression screen PHQ 2/9  Depression screen PHQ 2/9 01/03/2016  Decreased Interest 2  Down, Depressed, Hopeless 1  PHQ - 2 Score 3  Altered sleeping 3  Tired, decreased energy 1  Change in appetite 0  Feeling bad or failure about yourself  2  Trouble concentrating 2  Moving slowly or fidgety/restless 2  Suicidal thoughts 0  PHQ-9 Score 13        Review of Systems  Constitutional:       High blood sugar  HENT: Negative.   Respiratory: Positive for shortness of breath.   Cardiovascular:       Limb swelling   Genitourinary: Positive for difficulty urinating.  Neurological: Negative.   All other systems reviewed and are negative.      Objective:   Physical Exam Constitutional: She appears well-developed and well-nourished. NAD.  HENT: Normocephalic and atraumatic.   Mouth/Throat: Oropharynx is clear and moist.  Eyes: Conjunctivae and EOM are normal.  Cardiovascular: Normal rate and regular rhythm.  Respiratory: Effort normal. No stridor.  GI: Soft. Nontender. Bowel sounds normal.  Musculoskeletal: She exhibits edema (1+ pedal edema and B-tibially) and tenderness. Left knee tender and ankle tender without warmth or erthema. Left greater troch tender to palpation. +cross leg maneuver. She ambulated with shuffling gait. More antalgic on left .posture is fair, tends to flex. Neurological: She  is alert and oriented to person, place, and time.  Patient is very hard of hearing.  Motor: B/l UE 4+/5 proximal to distal B/l LE: Hip flexion, knee extension 3+/5, ankle dorsi/plantar flexion 4+/5  Skin: Skin is warm and dry.  Skin: Skin is warm and dry. No rash noted.  Back incision clean and dry  Psychiatric: She has a normal mood and affect. Her behavior is anxious       Assessment & Plan:  1. 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 -continue HHPT 3. Pain Management: --tylenol only. (see below) 4. Trochanteric bursitis (left)  -ice/voltaren gel.   -injection this month by ortho? 8. T2DM: per primary  9. CAD: Continue lopressor and lipitor.     12. CKD-outpt nephrology  14. Urinary retention/recent UTI  -she sees primary next Monday. If symptomatic a sample could be re-collected. No sx at present. Just completed abx Sunday   16. H/o gout: off colchicine, due to renal fxn  -discussed supplements to rx including ACV and tart cherry extract    Pt has ample follow up at home. Will not reschedule unless needed. Thirty minutes of face to face patient care time were spent during this visit. All questions were encouraged and answered.

## 2016-01-04 ENCOUNTER — Encounter: Payer: Self-pay | Admitting: *Deleted

## 2016-01-04 ENCOUNTER — Other Ambulatory Visit: Payer: Self-pay | Admitting: *Deleted

## 2016-01-04 DIAGNOSIS — N185 Chronic kidney disease, stage 5: Secondary | ICD-10-CM | POA: Diagnosis not present

## 2016-01-04 DIAGNOSIS — F329 Major depressive disorder, single episode, unspecified: Secondary | ICD-10-CM | POA: Diagnosis not present

## 2016-01-04 DIAGNOSIS — E1122 Type 2 diabetes mellitus with diabetic chronic kidney disease: Secondary | ICD-10-CM | POA: Diagnosis not present

## 2016-01-04 DIAGNOSIS — I120 Hypertensive chronic kidney disease with stage 5 chronic kidney disease or end stage renal disease: Secondary | ICD-10-CM | POA: Diagnosis not present

## 2016-01-04 DIAGNOSIS — Z4789 Encounter for other orthopedic aftercare: Secondary | ICD-10-CM | POA: Diagnosis not present

## 2016-01-04 DIAGNOSIS — I2510 Atherosclerotic heart disease of native coronary artery without angina pectoris: Secondary | ICD-10-CM | POA: Diagnosis not present

## 2016-01-04 NOTE — Patient Outreach (Signed)
[facility]) Care Management   01/04/2016  [name] [birth year] [id number]  [name] is an 75 y.o. female  Subjectivehu: Initial home visit with pt, HIPAA verified, spouse present, daughter and granddaughter present for part of visit, daughter provides oversight with medications but not with insulin, spouse states he uses same sliding scale for humalog and lantus and did not question whether this was correct.  Pt states she has home health with [place], PT, OT and this has helped her greatly.  Pt states she has "some mild depression" but refuses to have any medication changes.  Objective:   Filed Vitals:   01/04/16 1829  BP: 126/58  Pulse: 56  Resp: 18  Height: 1.638 m (5' 4.5")  Weight: 184 lb (83.462 kg)  SpO2: 97%  CBG 14 day average 179 21 day average 172 28 day average 174 ROS  Physical Exam  Constitutional: She is oriented to person, place, and time. She appears well-developed and well-nourished.  HENT:  Head: Normocephalic.  Neck: Normal range of motion. Neck supple.  Cardiovascular: Normal rate and regular rhythm.   Respiratory: Effort normal and breath sounds normal.  GI: Soft. Bowel sounds are normal.  Musculoskeletal: She exhibits edema.  2+ edema lower extremities bil  Neurological: She is alert and oriented to person, place, and time.  Skin: Skin is warm and dry.  Psychiatric: She has a normal mood and affect. Her behavior is normal. Judgment and thought content normal.    Encounter Medications:   Outpatient Encounter Prescriptions as of 01/04/2016  Medication Sig Note  . acetaminophen (TYLENOL) 325 MG tablet Take 2 tablets (650 mg total) by mouth every 6 (six) hours as needed for mild pain.   Marland Kitchen allopurinol (ZYLOPRIM) 100 MG tablet Take 100 mg by mouth 2 (two) times daily. Reported on 12/28/2015   . aspirin 325 MG tablet Take 325 mg by mouth daily.   Marland Kitchen atorvastatin (LIPITOR) 10 MG tablet TAKE ONE TABLET BY MOUTH ONE  TIME DAILY (Patient taking differently: Take 10 mg by mouth daily. )   . B Complex Vitamins (VITAMIN B COMPLEX PO) Take 1 tablet by mouth daily.   . brimonidine (ALPHAGAN P) 0.1 % SOLN Place 1 drop into the left eye 2 (two) times daily.   . calcitRIOL (ROCALTROL) 0.25 MCG capsule Take 0.25 mcg by mouth every other day.   . Cholecalciferol (VITAMIN D) 2000 UNITS tablet Take 2,000 Units by mouth 2 (two) times daily.   . citalopram (CELEXA) 20 MG tablet Take 20 mg by mouth daily.   . clonazePAM (KLONOPIN) 0.5 MG tablet Take 0.5 tablets (0.25 mg total) by mouth daily as needed (for anxiety).   . colchicine 0.6 MG tablet Take 0.6 mg by mouth daily as needed. Reported on 12/28/2015 12/24/2015: Received from: [facility]) Received Sig:   . Cyanocobalamin (VITAMIN B 12 PO) Take 1,000 mg by mouth daily.    . diclofenac sodium (VOLTAREN) 1 % GEL Apply 2 g topically 4 (four) times daily.   . fish oil-omega-3 fatty acids 1000 MG capsule Take 2 g by mouth daily.   . furosemide (LASIX) 80 MG tablet Take 1 tablet (80 mg total) by mouth daily.   Marland Kitchen HUMALOG 100 UNIT/ML injection Inject 0-12 Units into the skin 3 (three) times daily with meals. Sliding scale 01/04/2016: Pt has sliding scale and is taking 10 units- 18 units  . insulin glargine (LANTUS) 100 UNIT/ML  injection Inject 0.2-0.3 mLs (20-30 Units total) into the skin at bedtime.   . isosorbide mononitrate (IMDUR) 30 MG 24 hr tablet Take 1 tablet (30 mg total) by mouth daily.   Marland Kitchen linagliptin (TRADJENTA) 5 MG TABS tablet Take 1 tablet (5 mg total) by mouth daily with breakfast.   . Menthol-Methyl Salicylate (MUSCLE RUB) 10-15 % CREA Apply 1 application topically 2 (two) times daily.   . methocarbamol (ROBAXIN) 500 MG tablet Take 1 tablet (500 mg total) by mouth every 6 (six) hours as needed for muscle spasms. Wean to one pill four times a day as needed after a week.   . metoprolol (LOPRESSOR) 100 MG tablet  Take 1 tablet (100 mg total) by mouth 2 (two) times daily.   . nitroGLYCERIN (NITROSTAT) 0.4 MG SL tablet Place 0.4 mg under the tongue every 5 (five) minutes as needed. Chest pain 12/24/2015: Received from: [facility]) Received Sig:   . omeprazole (PRILOSEC) 20 MG capsule Take 20 mg by mouth daily as needed. Acid reflux   . prazosin (MINIPRESS) 1 MG capsule Take 1 capsule (1 mg total) by mouth at bedtime.   . sitaGLIPtin (JANUVIA) 25 MG tablet Take 25 mg by mouth daily. Reported on 12/28/2015 12/24/2015: Received from: [facility]) Received Sig:   . timolol (TIMOPTIC) 0.5 % ophthalmic solution Place 1 drop into the left eye daily.   . travoprost, benzalkonium, (TRAVATAN) 0.004 % ophthalmic solution Place 1 drop into the left eye at bedtime.   . [DISCONTINUED] ciprofloxacin (CIPRO) 250 MG tablet Take 1 tablet (250 mg total) by mouth 2 (two) times daily.    No facility-administered encounter medications on file as of 01/04/2016.    Functional Status:   In your present state of health, do you have any difficulty performing the following activities: 12/22/2015 11/23/2015  Hearing? Tempie Donning  Vision? N N  Difficulty concentrating or making decisions? N N  Walking or climbing stairs? Y Y  Dressing or bathing? N N  Doing errands, shopping? N N    Fall/Depression Screening:    PHQ 2/9 Scores 01/04/2016 01/03/2016  PHQ - 2 Score 2 3  PHQ- 9 Score 9 13   Fall Risk  01/04/2016 01/03/2016  Falls in the past year? No No  Risk for fall due to : Impaired balance/gait;History of fall(s) -    Assessment:  RN CM reviewed sliding scale for humalog insulin (pt no longer on novolog as insurance will not reimburse per patient's spouse)  Pt takes lantus 20-30 units at hs and spouse has been using same sliding scale for lantus as he does novolog and does not really know how much lantus to give.  RN CM reviewed  safety precautions and importance of working with home health to achieve goals.  Pt has finished antibiotic and denies any symptoms of urinary tract infection. Pt to see primary care MD Monday.  RN CM faxed initial home visit, barrier letter to primary MD Dr. Inda Merlin and requested a written sliding scale for insulin and dosing instructions for pt and to please give pt a copy when she sees MD on Monday 01/08/16.    THN CM Care Plan Problem One        Most Recent Value   Care Plan Problem One  Knowledge deficit related to diabetes   Role Documenting the Problem One  Care Management [place] for Problem One  Active   THN Long Term Goal (31-90 days)  pt will have better understanding of diabetes within 90 days aeb improvement in blood sugar , Hgb AIC within 90 days   THN Long Term Goal Start Date  12/28/15   Interventions for Problem One Long Term Goal  RN CM gave Living Better with Diabetes booklet and reviwed with pt and spouse, ask pt to read over diabetic materials provided.   THN CM Short Term Goal #1 (0-30 days)  pt will check CBG TID and log within 30 days.   THN CM Short Term Goal #1 Start Date  12/28/15   Interventions for Short Term Goal #1  RN CM reiterated importance of checking CBG consistenly everyday and recording in log   THN CM Short Term Goal #2 (0-30 days)  pt will verbalize plate method within 30 days.   THN CM Short Term Goal #2 Start Date  01/04/16   Interventions for Short Term Goal #2  RN CM reviewed plate method with pt and spouse, provided picture of plate method.    THN CM Care Plan Problem Two        Most Recent Value   Care Plan Problem Two  Knowledge deficit related to urinary tract infection   Role Documenting the Problem Two  Care Management Coordinator   Care Plan for Problem Two  Active   THN CM Short Term Goal #1 (0-30 days)  pt will verbalize ways to decrease urinary tract infection within 30 days.   THN CM Short Term Goal #1 Start Date  12/28/15    Interventions for Short Term Goal #2   RN CM reinforced signs/ symptoms urinary tract infection and importance of calling MD for early intervention, reviewed correlation of infection and elevated CBG.      Plan: continue weekly transition of care calls Follow up with home visit 02/01/16 Assess CBG log Review sliding scale  Jacqlyn Larsen [facility], [place] Coordinator [phone]

## 2016-01-05 ENCOUNTER — Other Ambulatory Visit: Payer: Self-pay | Admitting: *Deleted

## 2016-01-05 DIAGNOSIS — I2510 Atherosclerotic heart disease of native coronary artery without angina pectoris: Secondary | ICD-10-CM | POA: Diagnosis not present

## 2016-01-05 DIAGNOSIS — F329 Major depressive disorder, single episode, unspecified: Secondary | ICD-10-CM | POA: Diagnosis not present

## 2016-01-05 DIAGNOSIS — Z4789 Encounter for other orthopedic aftercare: Secondary | ICD-10-CM | POA: Diagnosis not present

## 2016-01-05 DIAGNOSIS — E1122 Type 2 diabetes mellitus with diabetic chronic kidney disease: Secondary | ICD-10-CM | POA: Diagnosis not present

## 2016-01-05 DIAGNOSIS — I120 Hypertensive chronic kidney disease with stage 5 chronic kidney disease or end stage renal disease: Secondary | ICD-10-CM | POA: Diagnosis not present

## 2016-01-05 DIAGNOSIS — N185 Chronic kidney disease, stage 5: Secondary | ICD-10-CM | POA: Diagnosis not present

## 2016-01-05 NOTE — Patient Outreach (Signed)
01/05/16- Telephone call to Dr. Inda Merlin office, spoke with receptionist and then transferred to voicemail, RN CM left voicemail reporting letter was faxed over requesting clarification for humalog sliding scale and correct dosing for lantus and that pt will be coming in to see MD on Monday 01/08/16, RN CM requests written out dosing of insulin to be given to pt on Monday.  Jacqlyn Larsen [facility], [place] Coordinator [phone]

## 2016-01-08 DIAGNOSIS — Z794 Long term (current) use of insulin: Secondary | ICD-10-CM | POA: Diagnosis not present

## 2016-01-08 DIAGNOSIS — N390 Urinary tract infection, site not specified: Secondary | ICD-10-CM | POA: Diagnosis not present

## 2016-01-08 DIAGNOSIS — N184 Chronic kidney disease, stage 4 (severe): Secondary | ICD-10-CM | POA: Diagnosis not present

## 2016-01-08 DIAGNOSIS — E119 Type 2 diabetes mellitus without complications: Secondary | ICD-10-CM | POA: Diagnosis not present

## 2016-01-08 DIAGNOSIS — E1165 Type 2 diabetes mellitus with hyperglycemia: Secondary | ICD-10-CM | POA: Diagnosis not present

## 2016-01-08 DIAGNOSIS — E0840 Diabetes mellitus due to underlying condition with diabetic neuropathy, unspecified: Secondary | ICD-10-CM | POA: Diagnosis not present

## 2016-01-08 DIAGNOSIS — Z7984 Long term (current) use of oral hypoglycemic drugs: Secondary | ICD-10-CM | POA: Diagnosis not present

## 2016-01-09 ENCOUNTER — Other Ambulatory Visit: Payer: Self-pay | Admitting: Physical Medicine and Rehabilitation

## 2016-01-09 DIAGNOSIS — I120 Hypertensive chronic kidney disease with stage 5 chronic kidney disease or end stage renal disease: Secondary | ICD-10-CM | POA: Diagnosis not present

## 2016-01-09 DIAGNOSIS — F329 Major depressive disorder, single episode, unspecified: Secondary | ICD-10-CM | POA: Diagnosis not present

## 2016-01-09 DIAGNOSIS — I2510 Atherosclerotic heart disease of native coronary artery without angina pectoris: Secondary | ICD-10-CM | POA: Diagnosis not present

## 2016-01-09 DIAGNOSIS — E1122 Type 2 diabetes mellitus with diabetic chronic kidney disease: Secondary | ICD-10-CM | POA: Diagnosis not present

## 2016-01-09 DIAGNOSIS — Z4789 Encounter for other orthopedic aftercare: Secondary | ICD-10-CM | POA: Diagnosis not present

## 2016-01-09 DIAGNOSIS — N185 Chronic kidney disease, stage 5: Secondary | ICD-10-CM | POA: Diagnosis not present

## 2016-01-10 ENCOUNTER — Other Ambulatory Visit: Payer: Self-pay | Admitting: *Deleted

## 2016-01-10 ENCOUNTER — Other Ambulatory Visit (HOSPITAL_COMMUNITY): Payer: Self-pay | Admitting: *Deleted

## 2016-01-10 DIAGNOSIS — N185 Chronic kidney disease, stage 5: Secondary | ICD-10-CM | POA: Diagnosis not present

## 2016-01-10 DIAGNOSIS — I120 Hypertensive chronic kidney disease with stage 5 chronic kidney disease or end stage renal disease: Secondary | ICD-10-CM | POA: Diagnosis not present

## 2016-01-10 DIAGNOSIS — F329 Major depressive disorder, single episode, unspecified: Secondary | ICD-10-CM | POA: Diagnosis not present

## 2016-01-10 DIAGNOSIS — Z4789 Encounter for other orthopedic aftercare: Secondary | ICD-10-CM | POA: Diagnosis not present

## 2016-01-10 DIAGNOSIS — I2510 Atherosclerotic heart disease of native coronary artery without angina pectoris: Secondary | ICD-10-CM | POA: Diagnosis not present

## 2016-01-10 DIAGNOSIS — E1122 Type 2 diabetes mellitus with diabetic chronic kidney disease: Secondary | ICD-10-CM | POA: Diagnosis not present

## 2016-01-10 NOTE — Patient Outreach (Signed)
01/10/16- Telephone call to pt for transition of care week 3, spoke with pt, HIPAA verified, pt states she is working with home health personnel at present and cannot talk very long.  Pt reports she did see primary MD this week and written copy of sliding scale insulin and reports taking as prescribed.  CBG today 114, no new concerns verbalized at present.  THN CM Care Plan Problem One        Most Recent Value   Care Plan Problem One  Knowledge deficit related to diabetes   Role Documenting the Problem One  Care Management Coordinator   Care Plan for Problem One  Active   THN Long Term Goal (31-90 days)  pt will have better understanding of diabetes within 90 days aeb improvement in blood sugar , Hgb AIC within 90 days   THN Long Term Goal Start Date  12/28/15   Interventions for Problem One Long Term Goal  RN CM confirmed pt did see primary care MD this week and did receive written copy of sliding scale insulin dosage, pt unable to discuss over the phone as she is with home health at present, pt states she is taking insulin as prescribed by MD   [facility] CM Short Term Goal #1 (0-30 days)  pt will check CBG TID and log within 30 days.   THN CM Short Term Goal #1 Start Date  12/28/15   Interventions for Short Term Goal #1  RN CM reminded pt of importance of checking CBG consistenly everyday and recording in log   THN CM Short Term Goal #2 (0-30 days)  pt will verbalize plate method within 30 days.   THN CM Short Term Goal #2 Start Date  01/04/16   Interventions for Short Term Goal #2  RN CM reiterated plate method    THN CM Care Plan Problem Two        Most Recent Value   Care Plan Problem Two  Knowledge deficit related to urinary tract infection   Role Documenting the Problem Two  Care Management Coordinator   Care Plan for Problem Two  Active   THN CM Short Term Goal #1 (0-30 days)  pt will verbalize ways to decrease urinary tract infection within 30 days.   THN CM Short Term Goal #1 Start Date   12/28/15      PLAN Continue weekly transition of care calls  Jacqlyn Larsen [facility], [place] Coordinator [phone]

## 2016-01-11 ENCOUNTER — Encounter (HOSPITAL_COMMUNITY)
Admission: RE | Admit: 2016-01-11 | Discharge: 2016-01-11 | Disposition: A | Discharge status: Home / Self Care | Payer: Medicare Other | Source: Ambulatory Visit | Attending: Nephrology | Admitting: Nephrology

## 2016-01-11 DIAGNOSIS — N184 Chronic kidney disease, stage 4 (severe): Secondary | ICD-10-CM | POA: Insufficient documentation

## 2016-01-11 DIAGNOSIS — D631 Anemia in chronic kidney disease: Secondary | ICD-10-CM | POA: Diagnosis not present

## 2016-01-11 DIAGNOSIS — H401131 Primary open-angle glaucoma, bilateral, mild stage: Secondary | ICD-10-CM | POA: Diagnosis not present

## 2016-01-11 LAB — POCT HEMOGLOBIN-HEMACUE: HEMOGLOBIN: 11.3 g/dL — AB (ref 12.0–15.0)

## 2016-01-11 MED ORDER — EPOETIN ALFA 20000 UNIT/ML IJ SOLN
INTRAMUSCULAR | Status: AC
  Filled 2016-01-11: qty 1

## 2016-01-11 MED ORDER — EPOETIN ALFA 20000 UNIT/ML IJ SOLN
20000.0000 [IU] | INTRAMUSCULAR | Status: DC
  Administered 2016-01-11: 20000 [IU] via SUBCUTANEOUS

## 2016-01-12 DIAGNOSIS — E1122 Type 2 diabetes mellitus with diabetic chronic kidney disease: Secondary | ICD-10-CM | POA: Diagnosis not present

## 2016-01-12 DIAGNOSIS — I120 Hypertensive chronic kidney disease with stage 5 chronic kidney disease or end stage renal disease: Secondary | ICD-10-CM | POA: Diagnosis not present

## 2016-01-12 DIAGNOSIS — F329 Major depressive disorder, single episode, unspecified: Secondary | ICD-10-CM | POA: Diagnosis not present

## 2016-01-12 DIAGNOSIS — Z4789 Encounter for other orthopedic aftercare: Secondary | ICD-10-CM | POA: Diagnosis not present

## 2016-01-12 DIAGNOSIS — I2510 Atherosclerotic heart disease of native coronary artery without angina pectoris: Secondary | ICD-10-CM | POA: Diagnosis not present

## 2016-01-12 DIAGNOSIS — N185 Chronic kidney disease, stage 5: Secondary | ICD-10-CM | POA: Diagnosis not present

## 2016-01-15 ENCOUNTER — Other Ambulatory Visit: Payer: Self-pay | Admitting: Physical Medicine and Rehabilitation

## 2016-01-15 NOTE — Telephone Encounter (Signed)
Pt needs refill

## 2016-01-15 NOTE — Telephone Encounter (Signed)
Would you like to fill or send to PCP?

## 2016-01-18 ENCOUNTER — Telehealth: Payer: Self-pay | Admitting: Physical Medicine & Rehabilitation

## 2016-01-18 ENCOUNTER — Other Ambulatory Visit: Payer: Self-pay | Admitting: *Deleted

## 2016-01-18 DIAGNOSIS — I2510 Atherosclerotic heart disease of native coronary artery without angina pectoris: Secondary | ICD-10-CM | POA: Diagnosis not present

## 2016-01-18 DIAGNOSIS — I120 Hypertensive chronic kidney disease with stage 5 chronic kidney disease or end stage renal disease: Secondary | ICD-10-CM | POA: Diagnosis not present

## 2016-01-18 DIAGNOSIS — Z4789 Encounter for other orthopedic aftercare: Secondary | ICD-10-CM | POA: Diagnosis not present

## 2016-01-18 DIAGNOSIS — N185 Chronic kidney disease, stage 5: Secondary | ICD-10-CM | POA: Diagnosis not present

## 2016-01-18 DIAGNOSIS — E1122 Type 2 diabetes mellitus with diabetic chronic kidney disease: Secondary | ICD-10-CM | POA: Diagnosis not present

## 2016-01-18 DIAGNOSIS — F329 Major depressive disorder, single episode, unspecified: Secondary | ICD-10-CM | POA: Diagnosis not present

## 2016-01-18 MED ORDER — FUROSEMIDE 80 MG PO TABS: 80.0000 mg | ORAL_TABLET | Freq: Every day | ORAL | Status: DC

## 2016-01-18 MED ORDER — PRAZOSIN HCL 1 MG PO CAPS: ORAL_CAPSULE | ORAL | Status: DC

## 2016-01-18 NOTE — Telephone Encounter (Signed)
I will refill x 2. Further RF needs to go through PCP. thanks

## 2016-01-18 NOTE — Patient Outreach (Signed)
01/18/16- Telephone call to pt for transition of care week 4, spoke with pt, HIPAA verified, pt states she is home alone and cannot hear well enough over the telephone and requests RN CM call her daughter or granddaughter.  RN CM called granddaughter Elmore Guise who reports home health has discharged pt and states " she's doing really good, cooking and doing other things around the house", reports " blood sugar excellent, mostly in low 100's range" and pt following insulin dosage scale as written out per MD. No new concerns or issues voiced.  THN CM Care Plan Problem One        Most Recent Value   Care Plan Problem One  Knowledge deficit related to diabetes   Role Documenting the Problem One  Care Management Coordinator   Care Plan for Problem One  Active   THN Long Term Goal (31-90 days)  pt will have better understanding of diabetes within 90 days aeb improvement in blood sugar , Hgb AIC within 90 days   THN Long Term Goal Start Date  12/28/15   Interventions for Problem One Long Term Goal  RN CM reviewed with granddaughter CBG readings   THN CM Short Term Goal #1 (0-30 days)  pt will check CBG TID and log within 30 days.   THN CM Short Term Goal #1 Start Date  12/28/15   Interventions for Short Term Goal #1  RN CM reiterated importance of checking CBG consistenly everyday and recording in log   THN CM Short Term Goal #2 (0-30 days)  pt will verbalize plate method within 30 days.   THN CM Short Term Goal #2 Start Date  01/04/16    [facility] CM Care Plan Problem Two        Most Recent Value   Care Plan Problem Two  Knowledge deficit related to urinary tract infection   Role Documenting the Problem Two  Care Management Coordinator   Care Plan for Problem Two  Active   THN CM Short Term Goal #1 (0-30 days)  pt will verbalize ways to decrease urinary tract infection within 30 days.   THN CM Short Term Goal #1 Start Date  12/28/15      PLAN Follow up with home visit 02/01/16  Jacqlyn Larsen [facility],  [place] Coordinator [phone]

## 2016-01-18 NOTE — Telephone Encounter (Signed)
Would you like to refill or send to PCP? 

## 2016-01-18 NOTE — Telephone Encounter (Signed)
[facility] pharmacy is calling about refills on Furosemide and Prazosin.  Please call at [phone].

## 2016-01-19 DIAGNOSIS — I120 Hypertensive chronic kidney disease with stage 5 chronic kidney disease or end stage renal disease: Secondary | ICD-10-CM | POA: Diagnosis not present

## 2016-01-19 DIAGNOSIS — E1122 Type 2 diabetes mellitus with diabetic chronic kidney disease: Secondary | ICD-10-CM | POA: Diagnosis not present

## 2016-01-19 DIAGNOSIS — I2510 Atherosclerotic heart disease of native coronary artery without angina pectoris: Secondary | ICD-10-CM | POA: Diagnosis not present

## 2016-01-19 DIAGNOSIS — F329 Major depressive disorder, single episode, unspecified: Secondary | ICD-10-CM | POA: Diagnosis not present

## 2016-01-19 DIAGNOSIS — Z4789 Encounter for other orthopedic aftercare: Secondary | ICD-10-CM | POA: Diagnosis not present

## 2016-01-19 DIAGNOSIS — N185 Chronic kidney disease, stage 5: Secondary | ICD-10-CM | POA: Diagnosis not present

## 2016-01-22 DIAGNOSIS — D631 Anemia in chronic kidney disease: Secondary | ICD-10-CM | POA: Diagnosis not present

## 2016-01-22 DIAGNOSIS — N184 Chronic kidney disease, stage 4 (severe): Secondary | ICD-10-CM | POA: Diagnosis not present

## 2016-01-22 DIAGNOSIS — N2581 Secondary hyperparathyroidism of renal origin: Secondary | ICD-10-CM | POA: Diagnosis not present

## 2016-01-22 DIAGNOSIS — E669 Obesity, unspecified: Secondary | ICD-10-CM | POA: Diagnosis not present

## 2016-01-22 DIAGNOSIS — I129 Hypertensive chronic kidney disease with stage 1 through stage 4 chronic kidney disease, or unspecified chronic kidney disease: Secondary | ICD-10-CM | POA: Diagnosis not present

## 2016-01-24 DIAGNOSIS — M25552 Pain in left hip: Secondary | ICD-10-CM | POA: Diagnosis not present

## 2016-01-25 DIAGNOSIS — E1122 Type 2 diabetes mellitus with diabetic chronic kidney disease: Secondary | ICD-10-CM | POA: Diagnosis not present

## 2016-01-25 DIAGNOSIS — N185 Chronic kidney disease, stage 5: Secondary | ICD-10-CM | POA: Diagnosis not present

## 2016-01-25 DIAGNOSIS — I2510 Atherosclerotic heart disease of native coronary artery without angina pectoris: Secondary | ICD-10-CM | POA: Diagnosis not present

## 2016-01-25 DIAGNOSIS — I120 Hypertensive chronic kidney disease with stage 5 chronic kidney disease or end stage renal disease: Secondary | ICD-10-CM | POA: Diagnosis not present

## 2016-01-25 DIAGNOSIS — F329 Major depressive disorder, single episode, unspecified: Secondary | ICD-10-CM | POA: Diagnosis not present

## 2016-01-25 DIAGNOSIS — Z4789 Encounter for other orthopedic aftercare: Secondary | ICD-10-CM | POA: Diagnosis not present

## 2016-01-30 ENCOUNTER — Other Ambulatory Visit (HOSPITAL_COMMUNITY): Payer: Self-pay | Admitting: *Deleted

## 2016-01-31 ENCOUNTER — Encounter (HOSPITAL_COMMUNITY)
Admission: RE | Admit: 2016-01-31 | Discharge: 2016-01-31 | Disposition: A | Discharge status: Home / Self Care | Payer: Medicare Other | Source: Ambulatory Visit | Attending: Nephrology | Admitting: Nephrology

## 2016-01-31 DIAGNOSIS — N184 Chronic kidney disease, stage 4 (severe): Secondary | ICD-10-CM | POA: Insufficient documentation

## 2016-01-31 DIAGNOSIS — D631 Anemia in chronic kidney disease: Secondary | ICD-10-CM | POA: Diagnosis not present

## 2016-01-31 LAB — FERRITIN: Ferritin: 486 ng/mL — ABNORMAL HIGH (ref 11–307)

## 2016-01-31 LAB — POCT HEMOGLOBIN-HEMACUE: Hemoglobin: 10.6 g/dL — ABNORMAL LOW (ref 12.0–15.0)

## 2016-01-31 MED ORDER — EPOETIN ALFA 20000 UNIT/ML IJ SOLN
20000.0000 [IU] | INTRAMUSCULAR | Status: DC
  Administered 2016-01-31: 20000 [IU] via SUBCUTANEOUS

## 2016-01-31 MED ORDER — EPOETIN ALFA 20000 UNIT/ML IJ SOLN
INTRAMUSCULAR | Status: AC
  Filled 2016-01-31: qty 1

## 2016-02-01 ENCOUNTER — Encounter: Payer: Self-pay | Admitting: *Deleted

## 2016-02-01 ENCOUNTER — Other Ambulatory Visit: Payer: Self-pay | Admitting: *Deleted

## 2016-02-01 NOTE — Patient Outreach (Signed)
[name] [facility]) Care Management   02/01/2016  [name] 02/24/1941 [id number]  [name] is an 75 y.o. female  Subjective: Routine home visit with pt, HIPAA verified, pt reports blood sugar " is better" since "going by the scale for insulin the correct way" and is not keeping a log of CBG readings but does continue to check at least TID, most all readings in 100's per pt and occasional 200 "if I eat something I shouldn't"  Pt states home health RN is continuing to see her and PT has discharged.    Objective:   Filed Vitals:   02/01/16 1356  BP: 136/58  Pulse: 56  Resp: 16  SpO2: 98%   ROS  Physical Exam  Constitutional: She is oriented to person, place, and time. She appears well-developed and well-nourished.  HENT:  Head: Normocephalic.  Neck: Normal range of motion.  Cardiovascular: Normal rate and regular rhythm.   Respiratory: Effort normal and breath sounds normal.  GI: Soft. Bowel sounds are normal.  Musculoskeletal: Normal range of motion. She exhibits edema.  2+ edema left lower extremity 1+ edema right lower extremity  Neurological: She is alert and oriented to person, place, and time.  Skin: Skin is warm.  Psychiatric: She has a normal mood and affect. Her behavior is normal. Judgment and thought content normal.    Encounter Medications:   Outpatient Encounter Prescriptions as of 02/01/2016  Medication Sig Note  . acetaminophen (TYLENOL) 325 MG tablet Take 2 tablets (650 mg total) by mouth every 6 (six) hours as needed for mild pain.   Marland Kitchen allopurinol (ZYLOPRIM) 100 MG tablet Take 100 mg by mouth 2 (two) times daily. Reported on 12/28/2015   . aspirin 325 MG tablet Take 325 mg by mouth daily.   Marland Kitchen atorvastatin (LIPITOR) 10 MG tablet TAKE ONE TABLET BY MOUTH ONE TIME DAILY (Patient taking differently: Take 10 mg by mouth daily. )   . B Complex Vitamins (VITAMIN B COMPLEX PO) Take 1 tablet by mouth daily.   . brimonidine (ALPHAGAN P) 0.1 % SOLN  Place 1 drop into the left eye 2 (two) times daily.   . calcitRIOL (ROCALTROL) 0.25 MCG capsule Take 0.25 mcg by mouth every other day.   . Cholecalciferol (VITAMIN D) 2000 UNITS tablet Take 2,000 Units by mouth 2 (two) times daily.   . citalopram (CELEXA) 20 MG tablet Take 20 mg by mouth daily.   . clonazePAM (KLONOPIN) 0.5 MG tablet Take 0.5 tablets (0.25 mg total) by mouth daily as needed (for anxiety).   . colchicine 0.6 MG tablet Take 0.6 mg by mouth daily as needed. Reported on 12/28/2015 12/24/2015: Received from: [facility]) Received Sig:   . Cyanocobalamin (VITAMIN B 12 PO) Take 1,000 mg by mouth daily.    . diclofenac sodium (VOLTAREN) 1 % GEL Apply 2 g topically 4 (four) times daily.   . fish oil-omega-3 fatty acids 1000 MG capsule Take 2 g by mouth daily.   . furosemide (LASIX) 80 MG tablet Take 1 tablet (80 mg total) by mouth daily.   Marland Kitchen HUMALOG 100 UNIT/ML injection Inject 2-15 Units into the skin 3 (three) times daily with meals. Sliding scale >100, <150- 2 units 151-200 - 4 units 201-250 - 6 units 251-300 - 8 units 301-350 - 10 units 351-400-   12 units Over 400 - 15 units and call MD 01/04/2016: Pt has sliding scale and is taking 10 units- 18  units  . insulin glargine (LANTUS) 100 UNIT/ML injection Inject 0.2-0.3 mLs (20-30 Units total) into the skin at bedtime. (Patient taking differently: Inject 30 Units into the skin at bedtime. )   . isosorbide mononitrate (IMDUR) 30 MG 24 hr tablet Take 1 tablet (30 mg total) by mouth daily.   Marland Kitchen linagliptin (TRADJENTA) 5 MG TABS tablet Take 1 tablet (5 mg total) by mouth daily with breakfast.   . Menthol-Methyl Salicylate (MUSCLE RUB) 10-15 % CREA Apply 1 application topically 2 (two) times daily.   . methocarbamol (ROBAXIN) 500 MG tablet Take 1 tablet (500 mg total) by mouth every 6 (six) hours as needed for muscle spasms. Wean to one pill four times a day as needed after a week.   .  metoprolol (LOPRESSOR) 100 MG tablet Take 1 tablet (100 mg total) by mouth 2 (two) times daily.   . nitroGLYCERIN (NITROSTAT) 0.4 MG SL tablet Place 0.4 mg under the tongue every 5 (five) minutes as needed. Chest pain 12/24/2015: Received from: [facility]) Received Sig:   . omeprazole (PRILOSEC) 20 MG capsule Take 20 mg by mouth daily as needed. Acid reflux   . prazosin (MINIPRESS) 1 MG capsule TAKE 1 CAPSULE (1 MG TOTAL) BY MOUTH AT BEDTIME.   . sitaGLIPtin (JANUVIA) 25 MG tablet Take 25 mg by mouth daily. Reported on 12/28/2015 12/24/2015: Received from: [facility]) Received Sig:   . timolol (TIMOPTIC) 0.5 % ophthalmic solution Place 1 drop into the left eye daily.   . travoprost, benzalkonium, (TRAVATAN) 0.004 % ophthalmic solution Place 1 drop into the left eye at bedtime.    No facility-administered encounter medications on file as of 02/01/2016.    Functional Status:   In your present state of health, do you have any difficulty performing the following activities: 01/08/2016 12/22/2015  Hearing? Tempie Donning  Vision? N N  Difficulty concentrating or making decisions? N N  Walking or climbing stairs? Y Y  Dressing or bathing? N N  Doing errands, shopping? Y N    Fall/Depression Screening:    PHQ 2/9 Scores 01/04/2016 01/03/2016  PHQ - 2 Score 2 3  PHQ- 9 Score 9 13    Assessment:  Urinary tract infection resolved,  RN CM reviewed sliding scale for insulin and pt states she and caregiver are adherent to the scale.  Pt appears stronger, is moving around more and using walker some of the time if needed.  THN CM Care Plan Problem One        Most Recent Value   Care Plan Problem One  Knowledge deficit related to diabetes   Role Documenting the Problem One  Care Management Coordinator   Care Plan for Problem One  Active   THN Long Term Goal (31-90 days)  pt will have better understanding  of diabetes within 90 days aeb improvement in blood sugar , Hgb AIC within 90 days   THN Long Term Goal Start Date  12/28/15   Interventions for Problem One Long Term Goal  RN CM reviewed insulin dosage written out on paper by MD, pt and caregiver verbalize understanding.   THN CM Short Term Goal #1 (0-30 days)  pt will check CBG TID and log within 30 days.   THN CM Short Term Goal #1 Start Date  02/01/16 [goal restarted]   Interventions for Short Term Goal #1  RN CM reviewed importance of checking CBG consistenly everyday  and recording in log   THN CM Short Term Goal #2 (0-30 days)  pt will verbalize plate method within 30 days.   THN CM Short Term Goal #2 Start Date  02/01/16 [goal restarted]   Interventions for Short Term Goal #2  RN CM reviewed plate method    THN CM Care Plan Problem Two        Most Recent Value   Care Plan Problem Two  Knowledge deficit related to urinary tract infection   Role Documenting the Problem Two  Care Management Coordinator   Care Plan for Problem Two  Active   THN CM Short Term Goal #1 (0-30 days)  pt will verbalize ways to decrease urinary tract infection within 30 days.   THN CM Short Term Goal #1 Start Date  12/28/15   [facility] CM Short Term Goal #1 Met Date   02/01/16      Plan: follow up with home visit 02/29/16 Assess CBG log  Jacqlyn Larsen [facility], BSN [place] Coordinator [phone]

## 2016-02-05 ENCOUNTER — Encounter: Payer: Self-pay | Admitting: Neurology

## 2016-02-05 ENCOUNTER — Ambulatory Visit (INDEPENDENT_AMBULATORY_CARE_PROVIDER_SITE_OTHER): Payer: Medicare Other | Admitting: Neurology

## 2016-02-05 VITALS — BP 170/60 | HR 54 | Ht 64.0 in | Wt 186.2 lb

## 2016-02-05 DIAGNOSIS — I2510 Atherosclerotic heart disease of native coronary artery without angina pectoris: Secondary | ICD-10-CM | POA: Diagnosis not present

## 2016-02-05 DIAGNOSIS — E119 Type 2 diabetes mellitus without complications: Secondary | ICD-10-CM | POA: Diagnosis not present

## 2016-02-05 DIAGNOSIS — G9340 Encephalopathy, unspecified: Secondary | ICD-10-CM | POA: Diagnosis not present

## 2016-02-05 DIAGNOSIS — I25709 Atherosclerosis of coronary artery bypass graft(s), unspecified, with unspecified angina pectoris: Secondary | ICD-10-CM | POA: Diagnosis not present

## 2016-02-05 DIAGNOSIS — I10 Essential (primary) hypertension: Secondary | ICD-10-CM | POA: Diagnosis not present

## 2016-02-05 NOTE — Progress Notes (Signed)
STROKE NEUROLOGY FOLLOW UP NOTE  NAME: [name] DOB: [birth year]  REASON FOR VISIT: stroke follow up HISTORY FROM: pt and husband and chart  Today we had the pleasure of seeing [name] in follow-up at our Neurology Clinic. Pt was accompanied by husband.   History Summary [name] is a 75 y.o. female with history of HTN, DM, CAD s/p CABG with brief afib not on anticoagulation, bilateral carotid artery stenosis and CEAs, and TIA was admitted on 07/13/15 for abnormal speech output and confusion. She did not receive IV t-PA due to minimal deficits.MRI negative for stroke. MRA no large vessel occlusion. CUS, TTE, EEG x 2 all unremarkable. LDL 81 and A1C 7.0. Her symptoms were not considered as TIA at that time but more likely encephalopathy and acute delirium due to AKI vs. Possible bacteremia vs. Possible seizure with prolonged post ictal. She was treated with vanco with trending down WBC and afebrile. Her AKI also much improved. Her symptoms gradually improved over time and more awake and alert and orientated. She was then discharged with continued ASA and lipitor. Of note, she did have brief afib s/p CABG procedure but there is no further evidence of further afib as per her cardiologist Dr. Irish Lack, so anticoagulation was not considered on discharge.   09/21/15 follow up - the patient has been doing well. No recurrent AMS or seizure or stroke. Check BP at home was good, today BP 126/59. Glucose at home around 170s, not compliant with diabetic diet. Has follow up with Dr. Irish Lack and had 30 day cardiac event monitoring and no afib found. Continued on ASA and lipitor.    Interval History During the interval time, pt had LB surgery in 11/2015. She stayed in rehab for 2 weeks before back home. Currently finished home PT/OT, but she still complains of left hip and back pain on walking. She came in today with wheelchair, but she is able to walk short distance with walker at home.  Her BP high today 170/60 and she attributed it to bacon she ate recently. Her glucose in better control, today at home 88. She is going to see Dr. Inda Merlin next month. So far husband declined outpt PT/OT and they would like to discuss with Dr. Inda Merlin.  REVIEW OF SYSTEMS: Full 14 system review of systems performed and notable only for those listed below and in HPI above, all others are negative:  Constitutional:   Cardiovascular: feet swelling Ear/Nose/Throat:  Hearing loss, ringing in ears Skin:  Eyes:   Respiratory:   Gastroitestinal:   Genitourinary:  Hematology/Lymphatic:  anemia Endocrine: feeling cold Musculoskeletal:  Joint pain, back pain, walking difficulties Allergy/Immunology:   Neurological:  HA Psychiatric:  Sleep:   The following represents the patient's updated allergies and side effects list: Allergies  Allergen Reactions  . Gabapentin Other (See Comments)    hallucinations  . Other     Other reaction(s): Fatigue, Gout, Hallucintions  . Sulfa Antibiotics     Other reaction(s): psychiatric disturbances  . Amlodipine Other (See Comments)    edema    The neurologically relevant items on the patient's problem list were reviewed on today's visit.  Neurologic Examination  A problem focused neurological exam (12 or more points of the single system neurologic examination, vital signs counts as 1 point, cranial nerves count for 8 points) was performed.  Blood pressure (!) 170/60, pulse (!) 54, height 5\' 4"  (1.626 m), weight 186 lb 3.2 oz (84.5 kg).  General -  Well nourished, well developed, in no apparent distress.  Ophthalmologic - Fundi not visualized due to small pupils.  Cardiovascular - Regular rate and rhythm with no murmur.  Mental Status -  Level of arousal and orientation to time, place, and person were intact. Language including expression, naming, repetition, comprehension was assessed and found intact. Fund of Knowledge was assessed and was  intact.  Cranial Nerves II - XII - II - Visual field intact OU. III, IV, VI - Extraocular movements intact. V - Facial sensation intact bilaterally. VII - Facial movement intact bilaterally. VIII - Hard of hearing & vestibular intact bilaterally. X - Palate elevates symmetrically. XI - Chin turning & shoulder shrug intact bilaterally. XII - Tongue protrusion intact.  Motor Strength - The patient's strength was normal in BUEs and 4/5 BLEs and pronator drift was absent.  Bulk was normal and fasciculations were absent.   Motor Tone - Muscle tone was assessed at the neck and appendages and was normal.  Reflexes - The patient's reflexes were 1+ in all extremities and she had no pathological reflexes.  Sensory - Light touch, temperature/pinprick were assessed and were normal.    Coordination - The patient had normal movements in the hands and feet with no ataxia or dysmetria.  Tremor was absent.  Gait and Station - in wheelchair today.   Data reviewed: I personally reviewed the images and agree with the radiology interpretations.  Ct Head Wo Contrast 07/13/2015 No acute intracranial abnormality noted.   MRI HEAD 07/13/2015 : No acute intracranial process, specifically no acute ischemia on this mildly motion degraded examination. Negative noncontrast MRI brain for age.   MRA HEAD 07/13/2015 No acute large vessel occlusion or definite high-grade stenosis on this moderately motion degraded examination.   EEG 07/15/15 - This EEG is abnormal with mild nonspecific generalized slowing of cerebral activity which can be seen with metabolic as well as toxic and degenerative encephalopathies. No evidence of an epileptic disorder was demonstrated. Compared to previous study on 07/14/2015, there is slight improvement in background cerebral activity.  EEG 07/14/15- this is an abnormal awake and asleep EEG that demonstrated findings consistent with a mild non specific encephalopathy. No  electrographic seizures seen.  CUS - Findings suggest 1-39% internal carotid artery stenosis bilaterally. The left vertebral artery is patent with antegrade flow. Unable to visualize the right vertebral artery.  TTE - - Left ventricle: The cavity size was normal. Wall thickness was normal. Systolic function was normal. The estimated ejection fraction was in the range of 55% to 60%. - Mitral valve: Calcified annulus. Mildly thickened leaflets . - Left atrium: The atrium was mildly dilated. - Atrial septum: No defect or patent foramen ovale was identified.  30 day cardia event monitoring - no afib  Component     Latest Ref Rng 07/13/2015 07/14/2015  Cholesterol     0 - 200 mg/dL 177   Triglycerides     <150 mg/dL 280 (H)   HDL Cholesterol     >40 mg/dL 40 (L)   Total CHOL/HDL Ratio      4.4   VLDL     0 - 40 mg/dL 56 (H)   LDL (calc)     0 - 99 mg/dL 81   Hemoglobin A1C     4.8 - 5.6 % 7.0 (H)   Mean Plasma Glucose      154   Ammonia     9 - 35 umol/L  14  TSH  0.350 - 4.500 uIU/mL  2.039  T4,Free(Direct)     0.61 - 1.12 ng/dL  0.77  Vitamin B12     180 - 914 pg/mL  4206 (H)  RPR     Non Reactive  Non Reactive  HIV     Non Reactive  Non Reactive  Thyroglobulin Antibody     0.0 - 0.9 IU/mL  <1.0  Thyroperoxidase Ab SerPl-aCnc     0 - 34 IU/mL  21    Assessment: As you may recall, she is a 75 y.o. Caucasian female with PMH of HTN, DM, CAD s/p CABG with brief afib not on anticoagulation, bilateral carotid artery stenosis and CEAs, and TIA was admitted on 07/13/15 for abnormal speech and confusion. MRI negative for stroke. MRA no large vessel occlusion. CUS, TTE, EEG x 2 all unremarkable. LDL 81 and A1C 7.0. Her symptoms were more likely encephalopathy and acute delirium due to AKI vs. Possible bacteremia vs. Possible seizure with prolonged post ictal. EEG negative. Her symptoms gradually improved over time and more awake and alert and orientated. She was then  discharged with continued ASA and lipitor. Of note, she did have brief afib s/p CABG procedure but there is no evidence of further afib. She follow up with Dr. Irish Lack and had 30 day cardiac event monitoring and no afib found. Had recent LB surgery and still has back and left hip pain. Finished home PT/OT and we recommend outpt PT/OT but husband would like to discuss with PCP. BP still not in good control.  Plan:  - continue ASA and lipitor for stroke prevention - check BP and glucose at home and record and bring over to Dr. Inda Merlin next visit - follow up with Cardiology regularly. - Follow up with your primary care physician for stroke risk factor modification. Recommend maintain blood pressure goal <130/80, diabetes with hemoglobin A1c goal below 6.5% and lipids with LDL cholesterol goal below 70 mg/dL.  - I recommend outpt PT/OT. Pt would like to discuss with Dr. Ninetta Lights next visit in 02/2016. - follow up as needed.  I spent more than 25 minutes of face to face time with the patient. Greater than 50% of time was spent in counseling and coordination of care.  No orders of the defined types were placed in this encounter.   Meds ordered this encounter  Medications  . b complex vitamins tablet    Sig: daily    Patient Instructions  - continue ASA and lipitor for stroke prevention - check BP and glucose at home and record and bring over to Dr. Inda Merlin next visit - follow up with Cardiology. - Follow up with your primary care physician for stroke risk factor modification. Recommend maintain blood pressure goal <130/80, diabetes with hemoglobin A1c goal below 6.5% and lipids with LDL cholesterol goal below 70 mg/dL.  - I recommend outpt PT/OT. Please discuss with Dr. Ninetta Lights next visit in 02/2016. - follow up as needed.   Rosalin Hawking, MD PhD [facility] [address] [place], [place] [address] [phone]

## 2016-02-05 NOTE — Patient Instructions (Addendum)
-   continue ASA and lipitor for stroke prevention - check BP and glucose at home and record and bring over to Dr. Inda Merlin next visit - follow up with Cardiology. - Follow up with your primary care physician for stroke risk factor modification. Recommend maintain blood pressure goal <130/80, diabetes with hemoglobin A1c goal below 6.5% and lipids with LDL cholesterol goal below 70 mg/dL.  - I recommend outpt PT/OT. Please discuss with Dr. Ninetta Lights next visit in 02/2016. - follow up as needed.

## 2016-02-08 DIAGNOSIS — I120 Hypertensive chronic kidney disease with stage 5 chronic kidney disease or end stage renal disease: Secondary | ICD-10-CM | POA: Diagnosis not present

## 2016-02-08 DIAGNOSIS — F329 Major depressive disorder, single episode, unspecified: Secondary | ICD-10-CM | POA: Diagnosis not present

## 2016-02-08 DIAGNOSIS — E1122 Type 2 diabetes mellitus with diabetic chronic kidney disease: Secondary | ICD-10-CM | POA: Diagnosis not present

## 2016-02-08 DIAGNOSIS — N185 Chronic kidney disease, stage 5: Secondary | ICD-10-CM | POA: Diagnosis not present

## 2016-02-08 DIAGNOSIS — I2510 Atherosclerotic heart disease of native coronary artery without angina pectoris: Secondary | ICD-10-CM | POA: Diagnosis not present

## 2016-02-08 DIAGNOSIS — Z4789 Encounter for other orthopedic aftercare: Secondary | ICD-10-CM | POA: Diagnosis not present

## 2016-02-13 ENCOUNTER — Ambulatory Visit
Admission: RE | Admit: 2016-02-13 | Discharge: 2016-02-13 | Disposition: A | Discharge status: Home / Self Care | Payer: Medicare Other | Source: Ambulatory Visit

## 2016-02-13 DIAGNOSIS — Z1231 Encounter for screening mammogram for malignant neoplasm of breast: Secondary | ICD-10-CM | POA: Diagnosis not present

## 2016-02-20 ENCOUNTER — Other Ambulatory Visit (HOSPITAL_COMMUNITY): Payer: Self-pay | Admitting: *Deleted

## 2016-02-20 DIAGNOSIS — E113592 Type 2 diabetes mellitus with proliferative diabetic retinopathy without macular edema, left eye: Secondary | ICD-10-CM | POA: Diagnosis not present

## 2016-02-20 DIAGNOSIS — H3561 Retinal hemorrhage, right eye: Secondary | ICD-10-CM | POA: Diagnosis not present

## 2016-02-20 DIAGNOSIS — H35372 Puckering of macula, left eye: Secondary | ICD-10-CM | POA: Diagnosis not present

## 2016-02-20 DIAGNOSIS — E113491 Type 2 diabetes mellitus with severe nonproliferative diabetic retinopathy without macular edema, right eye: Secondary | ICD-10-CM | POA: Diagnosis not present

## 2016-02-21 ENCOUNTER — Encounter (HOSPITAL_COMMUNITY)
Admission: RE | Admit: 2016-02-21 | Discharge: 2016-02-21 | Disposition: A | Discharge status: Home / Self Care | Payer: Medicare Other | Source: Ambulatory Visit | Attending: Nephrology | Admitting: Nephrology

## 2016-02-21 DIAGNOSIS — N185 Chronic kidney disease, stage 5: Secondary | ICD-10-CM

## 2016-02-21 DIAGNOSIS — D631 Anemia in chronic kidney disease: Secondary | ICD-10-CM | POA: Insufficient documentation

## 2016-02-21 DIAGNOSIS — N184 Chronic kidney disease, stage 4 (severe): Secondary | ICD-10-CM | POA: Diagnosis not present

## 2016-02-21 LAB — POCT HEMOGLOBIN-HEMACUE: Hemoglobin: 11.7 g/dL — ABNORMAL LOW (ref 12.0–15.0)

## 2016-02-21 MED ORDER — EPOETIN ALFA 20000 UNIT/ML IJ SOLN
INTRAMUSCULAR | Status: AC
  Filled 2016-02-21: qty 1

## 2016-02-21 MED ORDER — EPOETIN ALFA 20000 UNIT/ML IJ SOLN
20000.0000 [IU] | INTRAMUSCULAR | Status: DC
  Administered 2016-02-21: 20000 [IU] via SUBCUTANEOUS

## 2016-02-29 ENCOUNTER — Encounter: Payer: Self-pay | Admitting: *Deleted

## 2016-02-29 ENCOUNTER — Other Ambulatory Visit: Payer: Self-pay | Admitting: *Deleted

## 2016-02-29 NOTE — Patient Outreach (Signed)
[facility]) Care Management   02/29/2016  ZAKIYAH DIOP 1940-07-20 [id number]  [name] is an 75 y.o. female  Subjective: Routine home visit with pt, HIPAA verified, pt reports she has yeast under breast and has itching, has been using antifungal cream and states " this is helping".  Pt reports no medication changes and trying to "walk around every day, leaving home also when I can"  Pt reports she has been upset today over death of friend and says " my blood pressure may be up a little"  Objective:   Vitals:   02/29/16 1600  BP: (!) 146/78  Pulse: 62  Resp: 18  SpO2: 97%  14 day average 144 30 day average 152 Fasting CBG ranges from log 78-138 Random ranges from log 140-269 ROS  Physical Exam  Constitutional: She is oriented to person, place, and time. She appears well-developed and well-nourished.  HENT:  Head: Normocephalic.  Neck: Normal range of motion. Neck supple.  Cardiovascular: Normal rate and regular rhythm.   Respiratory: Effort normal and breath sounds normal.  GI: Soft. Bowel sounds are normal.  Musculoskeletal: Normal range of motion. She exhibits edema.  2+ edema lower extremities bil.  Neurological: She is alert and oriented to person, place, and time.  Skin: Skin is warm and dry.  Psychiatric: She has a normal mood and affect. Her behavior is normal. Judgment and thought content normal.    Encounter Medications:   Outpatient Encounter Prescriptions as of 02/29/2016  Medication Sig Note  . acetaminophen (TYLENOL) 325 MG tablet Take 2 tablets (650 mg total) by mouth every 6 (six) hours as needed for mild pain.   Marland Kitchen allopurinol (ZYLOPRIM) 100 MG tablet Take 100 mg by mouth 2 (two) times daily. Reported on 12/28/2015   . aspirin 325 MG tablet Take 325 mg by mouth daily.   Marland Kitchen atorvastatin (LIPITOR) 10 MG tablet TAKE ONE TABLET BY MOUTH ONE TIME DAILY (Patient taking differently: Take 10 mg by mouth daily. )   . B Complex Vitamins  (VITAMIN B COMPLEX PO) Take 1 tablet by mouth daily.   Marland Kitchen b complex vitamins tablet daily 02/05/2016: Received from: [place] ([place] and [place])  . brimonidine (ALPHAGAN P) 0.1 % SOLN Place 1 drop into the left eye 2 (two) times daily.   . calcitRIOL (ROCALTROL) 0.25 MCG capsule Take 0.25 mcg by mouth every other day.   . Cholecalciferol (VITAMIN D) 2000 UNITS tablet Take 2,000 Units by mouth 2 (two) times daily.   . citalopram (CELEXA) 20 MG tablet Take 20 mg by mouth daily.   . clonazePAM (KLONOPIN) 0.5 MG tablet Take 0.5 tablets (0.25 mg total) by mouth daily as needed (for anxiety).   . colchicine 0.6 MG tablet Take 0.6 mg by mouth daily as needed. Reported on 12/28/2015 12/24/2015: Received from: [place] [facility]) Received Sig:   . Cyanocobalamin (VITAMIN B 12 PO) Take 1,000 mg by mouth daily.    . fish oil-omega-3 fatty acids 1000 MG capsule Take 2 g by mouth daily.   . furosemide (LASIX) 80 MG tablet Take 1 tablet (80 mg total) by mouth daily.   Marland Kitchen HUMALOG 100 UNIT/ML injection Inject 2-15 Units into the skin 3 (three) times daily with meals. Sliding scale >100, <150- 2 units 151-200 - 4 units 201-250 - 6 units 251-300 - 8 units 301-350 - 10 units 351-400-   12 units Over 400 - 15  units and call MD   . insulin glargine (LANTUS) 100 UNIT/ML injection Inject 0.2-0.3 mLs (20-30 Units total) into the skin at bedtime. (Patient taking differently: Inject 30 Units into the skin at bedtime. )   . isosorbide mononitrate (IMDUR) 30 MG 24 hr tablet Take 1 tablet (30 mg total) by mouth daily.   Marland Kitchen linagliptin (TRADJENTA) 5 MG TABS tablet Take 1 tablet (5 mg total) by mouth daily with breakfast.   . Menthol-Methyl Salicylate (MUSCLE RUB) 10-15 % CREA Apply 1 application topically 2 (two) times daily.   . methocarbamol (ROBAXIN) 500 MG tablet Take 1 tablet (500 mg total) by mouth every 6 (six) hours as needed for  muscle spasms. Wean to one pill four times a day as needed after a week.   . metoprolol (LOPRESSOR) 100 MG tablet Take 1 tablet (100 mg total) by mouth 2 (two) times daily.   . nitroGLYCERIN (NITROSTAT) 0.4 MG SL tablet Place 0.4 mg under the tongue every 5 (five) minutes as needed. Chest pain 12/24/2015: Received from: [place] [facility]) Received Sig:   . omeprazole (PRILOSEC) 20 MG capsule Take 20 mg by mouth daily as needed. Acid reflux   . prazosin (MINIPRESS) 1 MG capsule TAKE 1 CAPSULE (1 MG TOTAL) BY MOUTH AT BEDTIME.   . sitaGLIPtin (JANUVIA) 25 MG tablet Take 25 mg by mouth daily. Reported on 12/28/2015 12/24/2015: Received from: [facility]) Received Sig:   . timolol (TIMOPTIC) 0.5 % ophthalmic solution Place 1 drop into the left eye daily.   . travoprost, benzalkonium, (TRAVATAN) 0.004 % ophthalmic solution Place 1 drop into the left eye at bedtime.    No facility-administered encounter medications on file as of 02/29/2016.     Functional Status:   In your present state of health, do you have any difficulty performing the following activities: 02/21/2016 01/08/2016  Hearing? N Y  Vision? N N  Difficulty concentrating or making decisions? N N  Walking or climbing stairs? N Y  Dressing or bathing? N N  Doing errands, shopping? - Y  Some recent data might be hidden    Fall/Depression Screening:    PHQ 2/9 Scores 01/04/2016 01/03/2016  PHQ - 2 Score 2 3  PHQ- 9 Score 9 13    Assessment:  RN CM talked with pt about discharge plan for next month is blood sugar is maintained or improved.  Pt to see primary MD 02/15/16 and nephrologist 03/25/16.  RN CM praised and encouraged pt for recording blood sugar readings and keeping good records, pt does show improvement in blood sugar readings.  [facility] CM Care Plan Problem One   Flowsheet Row Most Recent Value  Care Plan Problem One  Knowledge  deficit related to diabetes  Role Documenting the Problem One  Care Management [place] for Problem One  Active  THN Long Term Goal (31-90 days)  pt will have better understanding of diabetes within 90 days aeb improvement in blood sugar , Hgb AIC within 90 days  THN Long Term Goal Start Date  12/28/15  Interventions for Problem One Long Term Goal  RN CM reinforced insulin dosage and importance of taking as prescribed  THN CM Short Term Goal #1 (0-30 days)  pt will check CBG TID and log within 30 days.  THN CM Short Term Goal #1 Start Date  02/01/16 [goal restarted]  THN CM Short Term Goal #1 Met Date  02/29/16  Interventions for Short Term Goal #1  RN CM reiterated importance of checking CBG consistenly everyday and recording in log  THN CM Short Term Goal #2 (0-30 days)  pt will verbalize plate method within 30 days.  THN CM Short Term Goal #2 Start Date  02/29/16 [goal restarted]  Interventions for Short Term Goal #2  RN CM reviewed plate method and food choices    THN CM Care Plan Problem Two   Flowsheet Row Most Recent Value  Care Plan Problem Two  Alteration in skin integrity  Role Documenting the Problem Two  Care Management Coordinator  Care Plan for Problem Two  Active  THN CM Short Term Goal #1 (0-30 days)  pt will have resolution to yeast infection under left breast within 30 days  THN CM Short Term Goal #1 Start Date  02/29/16  Interventions for Short Term Goal #2   RN CM reviewed with pt keeping area under breast clean and dry and to continue what pt is already doing- applying antifungal cream daily.      Plan: follow up with home visit 03/26/16 Assess CBG log Possible discharge  Jacqlyn Larsen [facility] [place], [place] Coordinator [phone]

## 2016-03-02 ENCOUNTER — Encounter (INDEPENDENT_AMBULATORY_CARE_PROVIDER_SITE_OTHER): Payer: Self-pay

## 2016-03-02 DIAGNOSIS — M48061 Spinal stenosis, lumbar region without neurogenic claudication: Secondary | ICD-10-CM

## 2016-03-11 ENCOUNTER — Other Ambulatory Visit: Payer: Self-pay | Admitting: *Deleted

## 2016-03-11 MED ORDER — FUROSEMIDE 80 MG PO TABS: 80.0000 mg | ORAL_TABLET | Freq: Every day | ORAL | 1 refills | Status: DC

## 2016-03-13 ENCOUNTER — Encounter (HOSPITAL_COMMUNITY)
Admission: RE | Admit: 2016-03-13 | Discharge: 2016-03-13 | Disposition: A | Discharge status: Home / Self Care | Payer: Medicare Other | Source: Ambulatory Visit | Attending: Nephrology | Admitting: Nephrology

## 2016-03-13 DIAGNOSIS — E1121 Type 2 diabetes mellitus with diabetic nephropathy: Secondary | ICD-10-CM | POA: Diagnosis not present

## 2016-03-13 DIAGNOSIS — N184 Chronic kidney disease, stage 4 (severe): Secondary | ICD-10-CM | POA: Diagnosis not present

## 2016-03-13 DIAGNOSIS — E782 Mixed hyperlipidemia: Secondary | ICD-10-CM | POA: Diagnosis not present

## 2016-03-13 DIAGNOSIS — M545 Low back pain: Secondary | ICD-10-CM | POA: Diagnosis not present

## 2016-03-13 DIAGNOSIS — D81818 Other biotin-dependent carboxylase deficiency: Secondary | ICD-10-CM | POA: Diagnosis not present

## 2016-03-13 DIAGNOSIS — Z6832 Body mass index (BMI) 32.0-32.9, adult: Secondary | ICD-10-CM | POA: Diagnosis not present

## 2016-03-13 DIAGNOSIS — Z23 Encounter for immunization: Secondary | ICD-10-CM | POA: Diagnosis not present

## 2016-03-13 DIAGNOSIS — E0840 Diabetes mellitus due to underlying condition with diabetic neuropathy, unspecified: Secondary | ICD-10-CM | POA: Diagnosis not present

## 2016-03-13 DIAGNOSIS — E11319 Type 2 diabetes mellitus with unspecified diabetic retinopathy without macular edema: Secondary | ICD-10-CM | POA: Diagnosis not present

## 2016-03-13 DIAGNOSIS — Z0000 Encounter for general adult medical examination without abnormal findings: Secondary | ICD-10-CM | POA: Diagnosis not present

## 2016-03-13 DIAGNOSIS — Z7984 Long term (current) use of oral hypoglycemic drugs: Secondary | ICD-10-CM | POA: Diagnosis not present

## 2016-03-13 DIAGNOSIS — Z1389 Encounter for screening for other disorder: Secondary | ICD-10-CM | POA: Diagnosis not present

## 2016-03-13 DIAGNOSIS — N185 Chronic kidney disease, stage 5: Secondary | ICD-10-CM

## 2016-03-13 DIAGNOSIS — I10 Essential (primary) hypertension: Secondary | ICD-10-CM | POA: Diagnosis not present

## 2016-03-13 DIAGNOSIS — E559 Vitamin D deficiency, unspecified: Secondary | ICD-10-CM | POA: Diagnosis not present

## 2016-03-13 DIAGNOSIS — E1165 Type 2 diabetes mellitus with hyperglycemia: Secondary | ICD-10-CM | POA: Diagnosis not present

## 2016-03-13 DIAGNOSIS — D631 Anemia in chronic kidney disease: Secondary | ICD-10-CM | POA: Diagnosis not present

## 2016-03-13 LAB — IRON AND TIBC
IRON: 66 ug/dL (ref 28–170)
SATURATION RATIOS: 25 % (ref 10.4–31.8)
TIBC: 262 ug/dL (ref 250–450)
UIBC: 196 ug/dL

## 2016-03-13 LAB — POCT HEMOGLOBIN-HEMACUE: HEMOGLOBIN: 11.7 g/dL — AB (ref 12.0–15.0)

## 2016-03-13 MED ORDER — EPOETIN ALFA 20000 UNIT/ML IJ SOLN
20000.0000 [IU] | INTRAMUSCULAR | Status: DC
  Administered 2016-03-13: 20000 [IU] via SUBCUTANEOUS

## 2016-03-13 MED ORDER — EPOETIN ALFA 20000 UNIT/ML IJ SOLN
INTRAMUSCULAR | Status: AC
  Filled 2016-03-13: qty 1

## 2016-03-25 DIAGNOSIS — N2581 Secondary hyperparathyroidism of renal origin: Secondary | ICD-10-CM | POA: Diagnosis not present

## 2016-03-25 DIAGNOSIS — D631 Anemia in chronic kidney disease: Secondary | ICD-10-CM | POA: Diagnosis not present

## 2016-03-25 DIAGNOSIS — I129 Hypertensive chronic kidney disease with stage 1 through stage 4 chronic kidney disease, or unspecified chronic kidney disease: Secondary | ICD-10-CM | POA: Diagnosis not present

## 2016-03-25 DIAGNOSIS — N184 Chronic kidney disease, stage 4 (severe): Secondary | ICD-10-CM | POA: Diagnosis not present

## 2016-03-25 DIAGNOSIS — E669 Obesity, unspecified: Secondary | ICD-10-CM | POA: Diagnosis not present

## 2016-03-26 ENCOUNTER — Encounter: Payer: Self-pay | Admitting: *Deleted

## 2016-03-26 ENCOUNTER — Other Ambulatory Visit: Payer: Self-pay | Admitting: *Deleted

## 2016-03-26 NOTE — Patient Outreach (Signed)
[facility]) Care Management   03/26/2016  TANIAYA RUDDER 1941/02/22 [id number]  QUANESHA KLIMASZEWSKI is an 75 y.o. female  Subjective: Pt called and cancelled scheduled home visit for today and prefers telephonic assessment.  RN CM spoke with pt, HIPAA verified.  Pt reports she saw "kidney doctor yesterday and got a good report"  Pt reports CBG is 90-110 range fasting and in afternoon never over 200.  No medication changes to report, no other changes or issues reported.  Objective:   ROS  Physical Exam  Encounter Medications:   Outpatient Encounter Prescriptions as of 03/26/2016  Medication Sig Note  . acetaminophen (TYLENOL) 325 MG tablet Take 2 tablets (650 mg total) by mouth every 6 (six) hours as needed for mild pain.   Marland Kitchen allopurinol (ZYLOPRIM) 100 MG tablet Take 100 mg by mouth 2 (two) times daily. Reported on 12/28/2015   . aspirin 325 MG tablet Take 325 mg by mouth daily.   Marland Kitchen atorvastatin (LIPITOR) 10 MG tablet TAKE ONE TABLET BY MOUTH ONE TIME DAILY (Patient taking differently: Take 10 mg by mouth daily. )   . B Complex Vitamins (VITAMIN B COMPLEX PO) Take 1 tablet by mouth daily.   Marland Kitchen b complex vitamins tablet daily 02/05/2016: Received from: [facility] [facility] [place])  . brimonidine (ALPHAGAN P) 0.1 % SOLN Place 1 drop into the left eye 2 (two) times daily.   . calcitRIOL (ROCALTROL) 0.25 MCG capsule Take 0.25 mcg by mouth every other day.   . Cholecalciferol (VITAMIN D) 2000 UNITS tablet Take 2,000 Units by mouth 2 (two) times daily.   . citalopram (CELEXA) 20 MG tablet Take 20 mg by mouth daily.   . clonazePAM (KLONOPIN) 0.5 MG tablet Take 0.5 tablets (0.25 mg total) by mouth daily as needed (for anxiety).   . colchicine 0.6 MG tablet Take 0.6 mg by mouth daily as needed. Reported on 12/28/2015 12/24/2015: Received from: [facility]) Received Sig:   . Cyanocobalamin  (VITAMIN B 12 PO) Take 1,000 mg by mouth daily.    . fish oil-omega-3 fatty acids 1000 MG capsule Take 2 g by mouth daily.   . furosemide (LASIX) 80 MG tablet Take 1 tablet (80 mg total) by mouth daily.   Marland Kitchen HUMALOG 100 UNIT/ML injection Inject 2-15 Units into the skin 3 (three) times daily with meals. Sliding scale >100, <150- 2 units 151-200 - 4 units 201-250 - 6 units 251-300 - 8 units 301-350 - 10 units 351-400-   12 units Over 400 - 15 units and call MD   . insulin glargine (LANTUS) 100 UNIT/ML injection Inject 0.2-0.3 mLs (20-30 Units total) into the skin at bedtime. (Patient taking differently: Inject 30 Units into the skin at bedtime. )   . isosorbide mononitrate (IMDUR) 30 MG 24 hr tablet Take 1 tablet (30 mg total) by mouth daily.   Marland Kitchen linagliptin (TRADJENTA) 5 MG TABS tablet Take 1 tablet (5 mg total) by mouth daily with breakfast.   . Menthol-Methyl Salicylate (MUSCLE RUB) 10-15 % CREA Apply 1 application topically 2 (two) times daily.   . methocarbamol (ROBAXIN) 500 MG tablet Take 1 tablet (500 mg total) by mouth every 6 (six) hours as needed for muscle spasms. Wean to one pill four times a day as needed after a week.   . metoprolol (LOPRESSOR) 100 MG tablet Take 1 tablet (100 mg total) by mouth 2 (two) times daily.   Marland Kitchen  nitroGLYCERIN (NITROSTAT) 0.4 MG SL tablet Place 0.4 mg under the tongue every 5 (five) minutes as needed. Chest pain 12/24/2015: Received from: [facility]) Received Sig:   . omeprazole (PRILOSEC) 20 MG capsule Take 20 mg by mouth daily as needed. Acid reflux   . prazosin (MINIPRESS) 1 MG capsule TAKE 1 CAPSULE (1 MG TOTAL) BY MOUTH AT BEDTIME.   . sitaGLIPtin (JANUVIA) 25 MG tablet Take 25 mg by mouth daily. Reported on 12/28/2015 12/24/2015: Received from: [facility]) Received Sig:   . timolol (TIMOPTIC) 0.5 % ophthalmic solution Place 1 drop into the left  eye daily.   . travoprost, benzalkonium, (TRAVATAN) 0.004 % ophthalmic solution Place 1 drop into the left eye at bedtime.    No facility-administered encounter medications on file as of 03/26/2016.     Functional Status:   In your present state of health, do you have any difficulty performing the following activities: 03/13/2016 02/21/2016  Hearing? N N  Vision? N N  Difficulty concentrating or making decisions? N N  Walking or climbing stairs? N N  Dressing or bathing? N N  Doing errands, shopping? - -  Some recent data might be hidden    Fall/Depression Screening:    PHQ 2/9 Scores 01/04/2016 01/03/2016  PHQ - 2 Score 2 3  PHQ- 9 Score 9 13    Assessment:  RN CM discussed discharge plan with pt and pt agreeable for discharge from community today, RN CM offered health coach services and pt refuses.  RN CM reminded pt of action plan if any issues with blood sugar or change in health status/ symptoms.  RN CM faxed case closure letter to primary MD Dr. Josetta Huddle and mailed case closure letter to patient's home.  [facility] CM Care Plan Problem One   Flowsheet Row Most Recent Value  Care Plan Problem One  Knowledge deficit related to diabetes  Role Documenting the Problem One  Care Management Coordinator  Care Plan for Problem One  Active  [facility] Long Term Goal (31-90 days)  pt will have better understanding of diabetes within 90 days aeb improvement in blood sugar , Hgb AIC within 90 days  [facility] Long Term Goal Start Date  12/28/15  [facility] Long Term Goal Met Date  03/26/16  Interventions for Problem One Long Term Goal  RN CM reviewed insulin dosage and importance of taking as prescribed  [facility] CM Short Term Goal #1 (0-30 days)  pt will check CBG TID and log within 30 days.  [facility] CM Short Term Goal #1 Start Date  02/01/16 [goal restarted]  [facility] CM Short Term Goal #1 Met Date  03/26/16  Interventions for Short Term Goal #1  RN CM reviewed importance of checking CBG consistenly everyday and recording in  log  [facility] CM Short Term Goal #2 (0-30 days)  pt will verbalize plate method within 30 days.  [facility] CM Short Term Goal #2 Start Date  02/29/16 [goal restarted]  [facility] CM Short Term Goal #2 Met Date  03/26/16  Interventions for Short Term Goal #2  RN CM reinforced plate method and food choices    [facility] CM Care Plan Problem Two   Flowsheet Row Most Recent Value  Care Plan Problem Two  Alteration in skin integrity  Role Documenting the Problem Two  Care Management Coordinator  Care Plan for Problem Two  Active  [facility] CM Short Term Goal #1 (0-30 days)  pt will have resolution to yeast infection under left breast within 30 days  [facility] CM Short Term Goal #1 Start Date  02/29/16  [facility] CM Short Term Goal #1 Met Date   03/26/16  Interventions for Short Term Goal #2   RN CM reinforced with pt keeping area under breast clean and dry and to continue what pt is already doing- applying antifungal cream daily.      Plan: Discharge today  Jacqlyn Larsen [facility], BSN [place] Coordinator [phone]

## 2016-04-03 ENCOUNTER — Encounter (HOSPITAL_COMMUNITY)
Admission: RE | Admit: 2016-04-03 | Discharge: 2016-04-03 | Disposition: A | Discharge status: Home / Self Care | Payer: Medicare Other | Source: Ambulatory Visit | Attending: Nephrology | Admitting: Nephrology

## 2016-04-03 DIAGNOSIS — D631 Anemia in chronic kidney disease: Secondary | ICD-10-CM | POA: Diagnosis not present

## 2016-04-03 DIAGNOSIS — N184 Chronic kidney disease, stage 4 (severe): Secondary | ICD-10-CM | POA: Diagnosis not present

## 2016-04-03 DIAGNOSIS — N185 Chronic kidney disease, stage 5: Secondary | ICD-10-CM

## 2016-04-03 DIAGNOSIS — Z23 Encounter for immunization: Secondary | ICD-10-CM | POA: Diagnosis not present

## 2016-04-03 LAB — POCT HEMOGLOBIN-HEMACUE: HEMOGLOBIN: 11.6 g/dL — AB (ref 12.0–15.0)

## 2016-04-03 MED ORDER — EPOETIN ALFA 20000 UNIT/ML IJ SOLN
20000.0000 [IU] | INTRAMUSCULAR | Status: DC
  Administered 2016-04-03: 20000 [IU] via SUBCUTANEOUS

## 2016-04-03 MED ORDER — EPOETIN ALFA 20000 UNIT/ML IJ SOLN
INTRAMUSCULAR | Status: AC
  Filled 2016-04-03: qty 1

## 2016-04-09 ENCOUNTER — Other Ambulatory Visit: Payer: Self-pay | Admitting: Physical Medicine & Rehabilitation

## 2016-04-18 DIAGNOSIS — H401131 Primary open-angle glaucoma, bilateral, mild stage: Secondary | ICD-10-CM | POA: Diagnosis not present

## 2016-04-24 ENCOUNTER — Encounter (HOSPITAL_COMMUNITY)
Admission: RE | Admit: 2016-04-24 | Discharge: 2016-04-24 | Disposition: A | Discharge status: Home / Self Care | Payer: Medicare Other | Source: Ambulatory Visit | Attending: Nephrology | Admitting: Nephrology

## 2016-04-24 DIAGNOSIS — D631 Anemia in chronic kidney disease: Secondary | ICD-10-CM | POA: Diagnosis not present

## 2016-04-24 DIAGNOSIS — N184 Chronic kidney disease, stage 4 (severe): Secondary | ICD-10-CM | POA: Insufficient documentation

## 2016-04-24 DIAGNOSIS — N185 Chronic kidney disease, stage 5: Secondary | ICD-10-CM

## 2016-04-24 LAB — FERRITIN: Ferritin: 352 ng/mL — ABNORMAL HIGH (ref 11–307)

## 2016-04-24 MED ORDER — EPOETIN ALFA 20000 UNIT/ML IJ SOLN
20000.0000 [IU] | INTRAMUSCULAR | Status: DC
Start: 2016-04-24 — End: 2016-04-25
  Administered 2016-04-24: 20000 [IU] via SUBCUTANEOUS

## 2016-04-24 MED ORDER — EPOETIN ALFA 20000 UNIT/ML IJ SOLN
INTRAMUSCULAR | Status: AC
  Administered 2016-04-24: 20000 [IU] via SUBCUTANEOUS
  Filled 2016-04-24: qty 1

## 2016-04-25 LAB — POCT HEMOGLOBIN-HEMACUE: HEMOGLOBIN: 10.1 g/dL — AB (ref 12.0–15.0)

## 2016-04-30 ENCOUNTER — Ambulatory Visit (INDEPENDENT_AMBULATORY_CARE_PROVIDER_SITE_OTHER): Payer: Medicare Other | Admitting: Orthopaedic Surgery

## 2016-04-30 DIAGNOSIS — M48061 Spinal stenosis, lumbar region without neurogenic claudication: Secondary | ICD-10-CM

## 2016-05-06 DIAGNOSIS — L570 Actinic keratosis: Secondary | ICD-10-CM | POA: Diagnosis not present

## 2016-05-10 ENCOUNTER — Other Ambulatory Visit (HOSPITAL_COMMUNITY): Payer: Self-pay | Admitting: *Deleted

## 2016-05-13 ENCOUNTER — Encounter (HOSPITAL_COMMUNITY)
Admission: RE | Admit: 2016-05-13 | Discharge: 2016-05-13 | Disposition: A | Discharge status: Home / Self Care | Payer: Medicare Other | Source: Ambulatory Visit | Attending: Nephrology | Admitting: Nephrology

## 2016-05-13 DIAGNOSIS — D631 Anemia in chronic kidney disease: Secondary | ICD-10-CM | POA: Diagnosis not present

## 2016-05-13 DIAGNOSIS — N185 Chronic kidney disease, stage 5: Secondary | ICD-10-CM

## 2016-05-13 DIAGNOSIS — N184 Chronic kidney disease, stage 4 (severe): Secondary | ICD-10-CM | POA: Diagnosis not present

## 2016-05-13 LAB — POCT HEMOGLOBIN-HEMACUE: Hemoglobin: 10.2 g/dL — ABNORMAL LOW (ref 12.0–15.0)

## 2016-05-13 MED ORDER — EPOETIN ALFA 20000 UNIT/ML IJ SOLN
20000.0000 [IU] | INTRAMUSCULAR | Status: DC
  Administered 2016-05-13: 20000 [IU] via SUBCUTANEOUS

## 2016-05-13 MED ORDER — EPOETIN ALFA 20000 UNIT/ML IJ SOLN
INTRAMUSCULAR | Status: AC
  Filled 2016-05-13: qty 1

## 2016-05-15 ENCOUNTER — Encounter (HOSPITAL_COMMUNITY): Payer: Medicare Other

## 2016-05-27 DIAGNOSIS — E669 Obesity, unspecified: Secondary | ICD-10-CM | POA: Diagnosis not present

## 2016-05-27 DIAGNOSIS — D631 Anemia in chronic kidney disease: Secondary | ICD-10-CM | POA: Diagnosis not present

## 2016-05-27 DIAGNOSIS — I129 Hypertensive chronic kidney disease with stage 1 through stage 4 chronic kidney disease, or unspecified chronic kidney disease: Secondary | ICD-10-CM | POA: Diagnosis not present

## 2016-05-27 DIAGNOSIS — N2581 Secondary hyperparathyroidism of renal origin: Secondary | ICD-10-CM | POA: Diagnosis not present

## 2016-05-27 DIAGNOSIS — N184 Chronic kidney disease, stage 4 (severe): Secondary | ICD-10-CM | POA: Diagnosis not present

## 2016-06-04 ENCOUNTER — Other Ambulatory Visit (HOSPITAL_COMMUNITY): Payer: Self-pay | Admitting: *Deleted

## 2016-06-05 ENCOUNTER — Encounter (HOSPITAL_COMMUNITY)
Admission: RE | Admit: 2016-06-05 | Discharge: 2016-06-05 | Disposition: A | Discharge status: Home / Self Care | Payer: Medicare Other | Source: Ambulatory Visit | Attending: Nephrology | Admitting: Nephrology

## 2016-06-05 DIAGNOSIS — N184 Chronic kidney disease, stage 4 (severe): Secondary | ICD-10-CM | POA: Insufficient documentation

## 2016-06-05 DIAGNOSIS — D631 Anemia in chronic kidney disease: Secondary | ICD-10-CM | POA: Insufficient documentation

## 2016-06-05 DIAGNOSIS — N185 Chronic kidney disease, stage 5: Secondary | ICD-10-CM

## 2016-06-05 LAB — IRON AND TIBC
IRON: 72 ug/dL (ref 28–170)
Saturation Ratios: 25 % (ref 10.4–31.8)
TIBC: 290 ug/dL (ref 250–450)
UIBC: 218 ug/dL

## 2016-06-05 LAB — POCT HEMOGLOBIN-HEMACUE: Hemoglobin: 10.6 g/dL — ABNORMAL LOW (ref 12.0–15.0)

## 2016-06-05 MED ORDER — EPOETIN ALFA 20000 UNIT/ML IJ SOLN
20000.0000 [IU] | INTRAMUSCULAR | Status: DC
  Administered 2016-06-05: 20000 [IU] via SUBCUTANEOUS

## 2016-06-05 MED ORDER — EPOETIN ALFA 20000 UNIT/ML IJ SOLN
INTRAMUSCULAR | Status: AC
  Administered 2016-06-05: 20000 [IU] via SUBCUTANEOUS
  Filled 2016-06-05: qty 1

## 2016-06-26 ENCOUNTER — Encounter (HOSPITAL_COMMUNITY)
Admission: RE | Admit: 2016-06-26 | Discharge: 2016-06-26 | Disposition: A | Discharge status: Home / Self Care | Payer: Medicare Other | Source: Ambulatory Visit | Attending: Nephrology | Admitting: Nephrology

## 2016-06-26 DIAGNOSIS — N185 Chronic kidney disease, stage 5: Secondary | ICD-10-CM

## 2016-06-26 DIAGNOSIS — D631 Anemia in chronic kidney disease: Secondary | ICD-10-CM | POA: Insufficient documentation

## 2016-06-26 DIAGNOSIS — N184 Chronic kidney disease, stage 4 (severe): Secondary | ICD-10-CM | POA: Diagnosis not present

## 2016-06-26 LAB — POCT HEMOGLOBIN-HEMACUE: Hemoglobin: 11.5 g/dL — ABNORMAL LOW (ref 12.0–15.0)

## 2016-06-26 MED ORDER — EPOETIN ALFA 20000 UNIT/ML IJ SOLN
20000.0000 [IU] | INTRAMUSCULAR | Status: DC
  Administered 2016-06-26: 20000 [IU] via SUBCUTANEOUS

## 2016-06-26 MED ORDER — EPOETIN ALFA 20000 UNIT/ML IJ SOLN
INTRAMUSCULAR | Status: AC
  Filled 2016-06-26: qty 1

## 2016-07-17 ENCOUNTER — Encounter (HOSPITAL_COMMUNITY)
Admission: RE | Admit: 2016-07-17 | Discharge: 2016-07-17 | Disposition: A | Discharge status: Home / Self Care | Payer: Medicare Other | Source: Ambulatory Visit | Attending: Nephrology | Admitting: Nephrology

## 2016-07-17 DIAGNOSIS — D631 Anemia in chronic kidney disease: Secondary | ICD-10-CM | POA: Insufficient documentation

## 2016-07-17 DIAGNOSIS — N185 Chronic kidney disease, stage 5: Secondary | ICD-10-CM

## 2016-07-17 DIAGNOSIS — N184 Chronic kidney disease, stage 4 (severe): Secondary | ICD-10-CM | POA: Insufficient documentation

## 2016-07-17 LAB — FERRITIN: Ferritin: 317 ng/mL — ABNORMAL HIGH (ref 11–307)

## 2016-07-17 MED ORDER — EPOETIN ALFA 20000 UNIT/ML IJ SOLN
INTRAMUSCULAR | Status: AC
  Filled 2016-07-17: qty 1

## 2016-07-17 MED ORDER — EPOETIN ALFA 20000 UNIT/ML IJ SOLN
20000.0000 [IU] | INTRAMUSCULAR | Status: DC
  Administered 2016-07-17: 13:00:00 20000 [IU] via SUBCUTANEOUS

## 2016-07-18 LAB — POCT HEMOGLOBIN-HEMACUE: Hemoglobin: 11.4 g/dL — ABNORMAL LOW (ref 12.0–15.0)

## 2016-07-22 DIAGNOSIS — H401131 Primary open-angle glaucoma, bilateral, mild stage: Secondary | ICD-10-CM | POA: Diagnosis not present

## 2016-08-06 ENCOUNTER — Other Ambulatory Visit (HOSPITAL_COMMUNITY): Payer: Self-pay | Admitting: *Deleted

## 2016-08-07 ENCOUNTER — Encounter (HOSPITAL_COMMUNITY)
Admission: RE | Admit: 2016-08-07 | Discharge: 2016-08-07 | Disposition: A | Discharge status: Home / Self Care | Payer: Medicare Other | Source: Ambulatory Visit | Attending: Nephrology | Admitting: Nephrology

## 2016-08-07 ENCOUNTER — Encounter (HOSPITAL_COMMUNITY): Payer: Medicare Other

## 2016-08-07 DIAGNOSIS — N185 Chronic kidney disease, stage 5: Secondary | ICD-10-CM

## 2016-08-07 DIAGNOSIS — N184 Chronic kidney disease, stage 4 (severe): Secondary | ICD-10-CM | POA: Diagnosis not present

## 2016-08-07 DIAGNOSIS — D631 Anemia in chronic kidney disease: Secondary | ICD-10-CM | POA: Diagnosis not present

## 2016-08-07 LAB — POCT HEMOGLOBIN-HEMACUE: HEMOGLOBIN: 11.8 g/dL — AB (ref 12.0–15.0)

## 2016-08-07 MED ORDER — EPOETIN ALFA 20000 UNIT/ML IJ SOLN
INTRAMUSCULAR | Status: AC
  Administered 2016-08-07: 20000 [IU] via SUBCUTANEOUS
  Filled 2016-08-07: qty 1

## 2016-08-07 MED ORDER — EPOETIN ALFA 20000 UNIT/ML IJ SOLN
20000.0000 [IU] | INTRAMUSCULAR | Status: DC
  Administered 2016-08-07: 20000 [IU] via SUBCUTANEOUS

## 2016-08-08 IMAGING — CR DG ELBOW 2V*R*
2 series · 2 of 2 positions shown · non-contrast
Comparison: None.

CLINICAL DATA: Posterior elbow pain and swelling.

EXAM:
RIGHT ELBOW - 2 VIEW

[lateral]
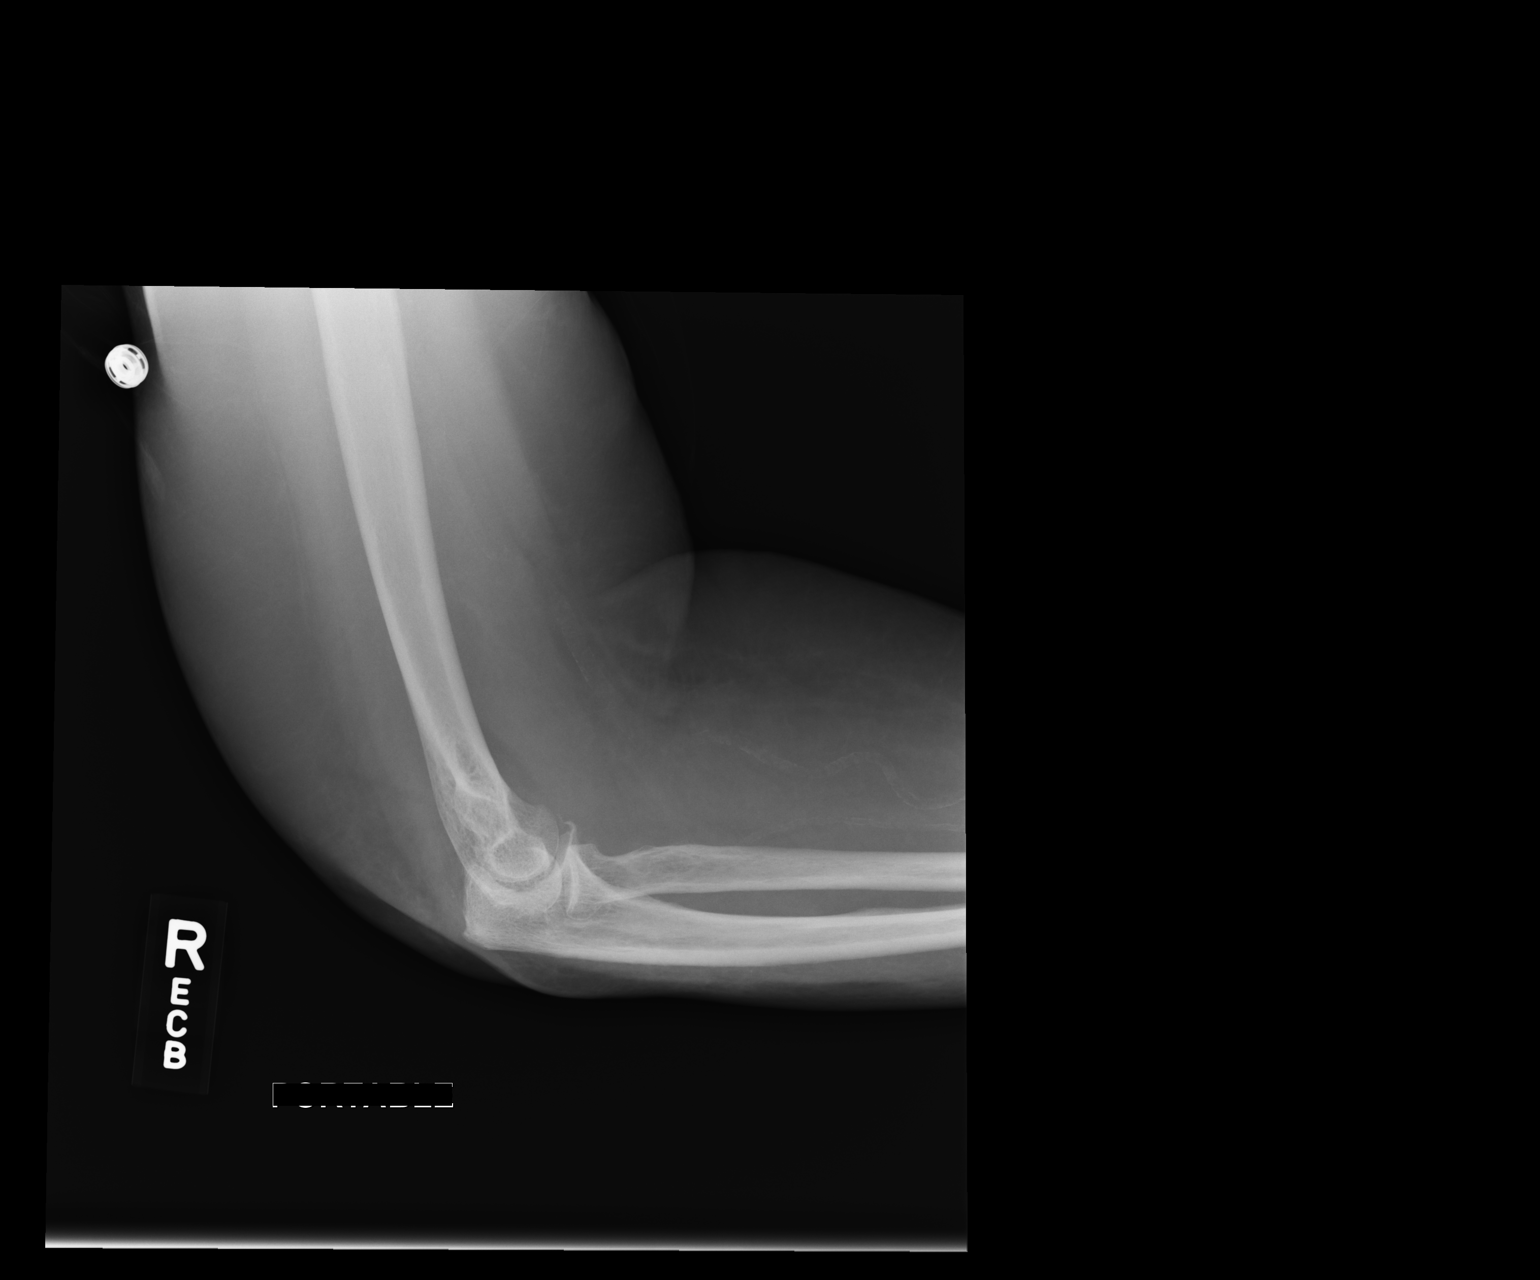

[AP]
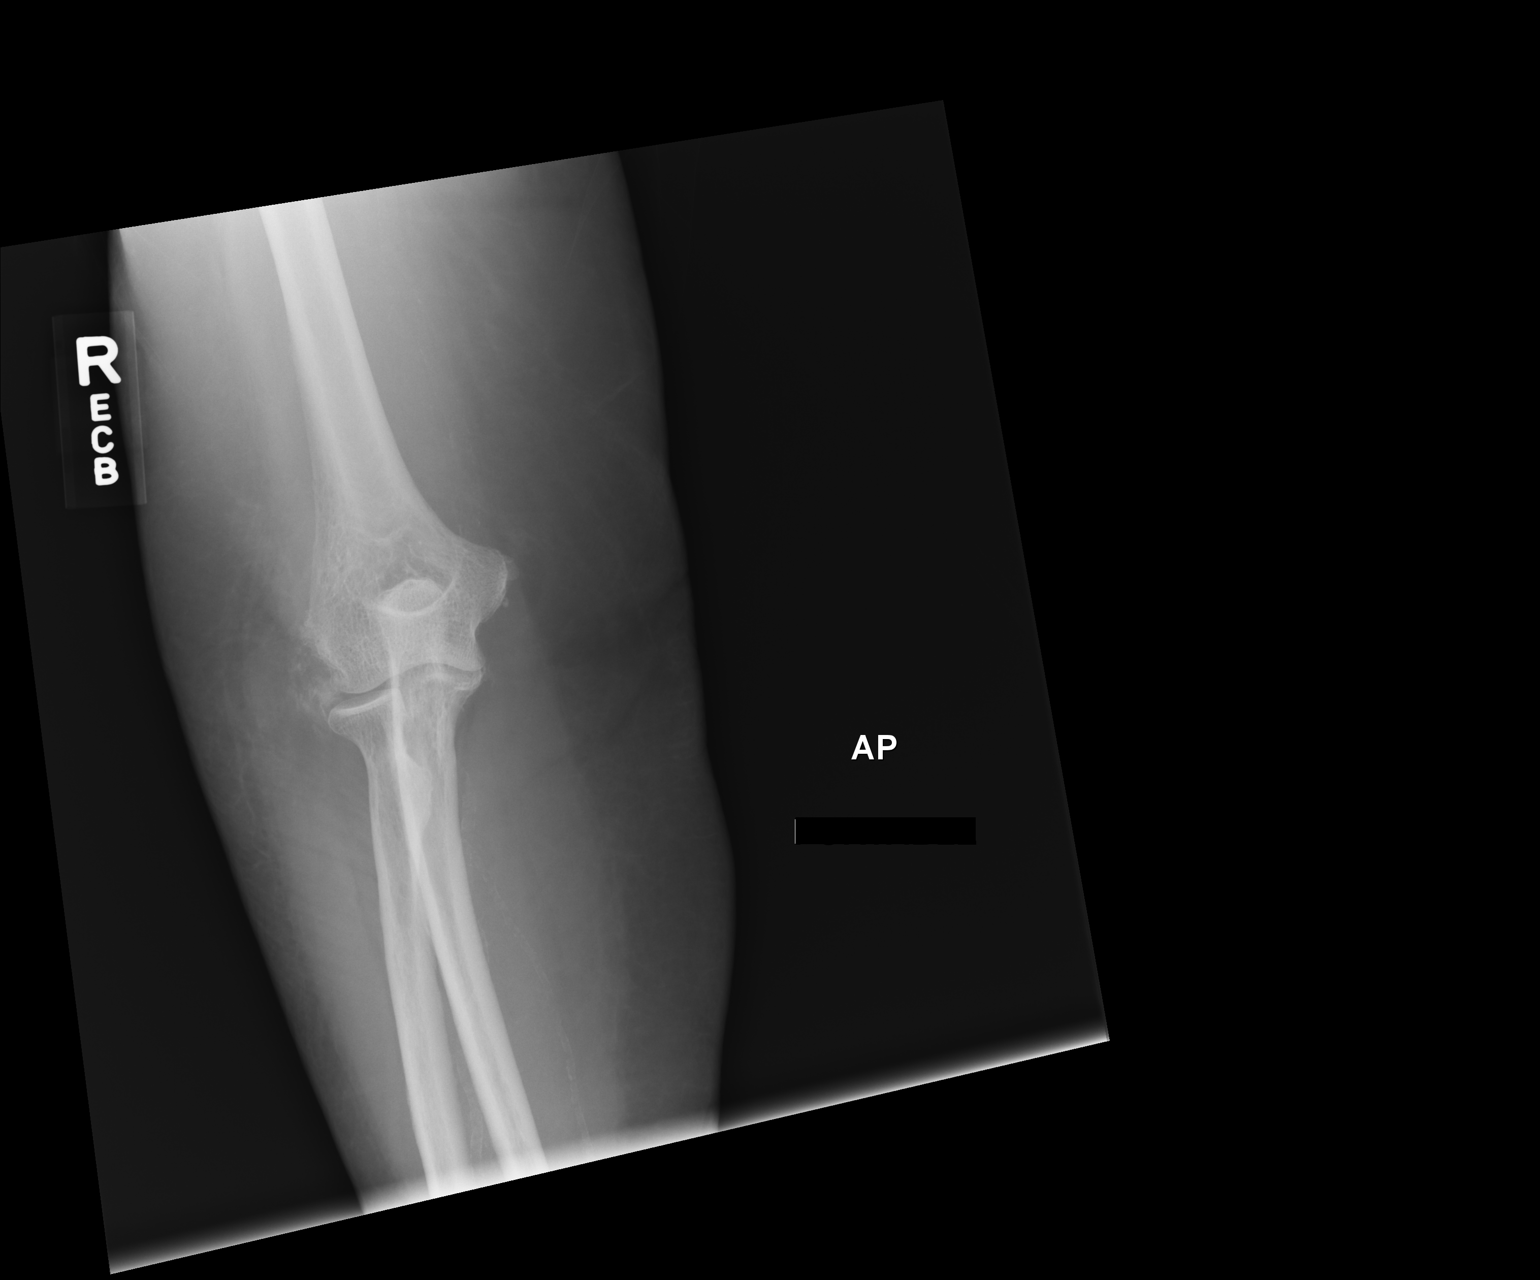

[2 of 2 positions shown; findings below may reference images not displayed]

FINDINGS: There is no evidence of fracture, dislocation, or joint effusion.
There is no evidence of arthropathy or other focal bone abnormality.
Dystrophic calcification at the origin of the right common extensor
tendon origin. There is peripheral vascular atherosclerotic disease.
IMPRESSION: No acute osseous injury of the right elbow.

## 2016-08-13 DIAGNOSIS — E669 Obesity, unspecified: Secondary | ICD-10-CM | POA: Diagnosis not present

## 2016-08-13 DIAGNOSIS — N2581 Secondary hyperparathyroidism of renal origin: Secondary | ICD-10-CM | POA: Diagnosis not present

## 2016-08-13 DIAGNOSIS — D631 Anemia in chronic kidney disease: Secondary | ICD-10-CM | POA: Diagnosis not present

## 2016-08-13 DIAGNOSIS — I129 Hypertensive chronic kidney disease with stage 1 through stage 4 chronic kidney disease, or unspecified chronic kidney disease: Secondary | ICD-10-CM | POA: Diagnosis not present

## 2016-08-13 DIAGNOSIS — N184 Chronic kidney disease, stage 4 (severe): Secondary | ICD-10-CM | POA: Diagnosis not present

## 2016-08-28 ENCOUNTER — Encounter (HOSPITAL_COMMUNITY)
Admission: RE | Admit: 2016-08-28 | Discharge: 2016-08-28 | Disposition: A | Discharge status: Home / Self Care | Payer: Medicare Other | Source: Ambulatory Visit | Attending: Nephrology | Admitting: Nephrology

## 2016-08-28 DIAGNOSIS — D631 Anemia in chronic kidney disease: Secondary | ICD-10-CM | POA: Insufficient documentation

## 2016-08-28 DIAGNOSIS — N184 Chronic kidney disease, stage 4 (severe): Secondary | ICD-10-CM | POA: Diagnosis not present

## 2016-08-28 DIAGNOSIS — N185 Chronic kidney disease, stage 5: Secondary | ICD-10-CM

## 2016-08-28 LAB — POCT HEMOGLOBIN-HEMACUE: HEMOGLOBIN: 11 g/dL — AB (ref 12.0–15.0)

## 2016-08-28 MED ORDER — EPOETIN ALFA 20000 UNIT/ML IJ SOLN
20000.0000 [IU] | INTRAMUSCULAR | Status: DC
  Administered 2016-08-28: 20000 [IU] via SUBCUTANEOUS

## 2016-08-28 MED ORDER — EPOETIN ALFA 20000 UNIT/ML IJ SOLN
INTRAMUSCULAR | Status: AC
  Filled 2016-08-28: qty 1

## 2016-09-04 DIAGNOSIS — R55 Syncope and collapse: Secondary | ICD-10-CM | POA: Diagnosis not present

## 2016-09-06 ENCOUNTER — Encounter: Payer: Self-pay | Admitting: Interventional Cardiology

## 2016-09-15 NOTE — Progress Notes (Signed)
Patient ID: [name], female   DOB: Nov 30, 1940, 76 y.o.   MRN: [id number]     Cardiology Office Note   Date:  09/16/2016   ID:  [name], DOB 1941/03/30, MRN [id number]  PCP:  Henrine Screws, MD    No chief complaint on file.  follow-up CAD   Wt Readings from Last 3 Encounters:  09/16/16 184 lb (83.5 kg)  01/24/16 179 lb (81.2 kg)  02/05/16 186 lb 3.2 oz (84.5 kg)       History of Present Illness: [name] is a 76 y.o. female  with a history of coronary artery disease. She also has renal insufficiency. She had an episode of right-sided weakness and confusion in December 2016. This was concerning for a TIA. She has a history of postoperative atrial fibrillation after bypass surgery several years ago. The concern was then made that perhaps she had paroxysmal atrial fibrillation, and embolism and her TIA symptoms. Her neurologic symptoms have improved significantly. Her mental status is back to normal.  A monitor was placed in early 2017. Preliminary results show no evidence of atrial fibrillation. The thought was that if she did have evidence of atrial fibrillation, she would require anticoagulation, likely with Coumadin due to her renal insufficiency. In the interim, she has only been on aspirin.  No further issues.  Family reports that she sleeps all of the time.  BP range om 67-341 systolic.  She has had some lightheaded spells but this improved with a decrease in her BP meds.  No recent syncope.  No CP or NTG usage.     Past Medical History:  Diagnosis Date  . Anemia   . Arthritis   . Atrial fibrillation ([place])   . Blood transfusion   . CAD (coronary artery disease)   . Cancer ([place])    .  top of head- melonoma  . Carotid artery occlusion    Carotid Endartectom,y - left 2009.  Blockage Right being watched by Dr Scot Dock.  . Carotid stenosis   . 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 Complication of anesthesia    pt. states that she was  difficult to wake  . Depression   . Diabetes mellitus without complication ([place])   . Dysrhythmia   . General weakness 12/2015  . GERD (gastroesophageal reflux disease)   . Glaucoma   . History of hiatal hernia   . History of kidney stones    passed  . History of pneumonia   . HOH (hard of hearing)   . Hypertension   . Hypokalemia 12/2015  . Myocardial infarction   . Neuromuscular disorder ([place])    CARPEL TUNNEL  . Pneumonia   . Shortness of breath   . Stroke ([place])    hx of TIA    Past Surgical History:  Procedure Laterality Date  . AV FISTULA PLACEMENT Left 05/31/2014   Procedure: Creation of Left Arm arteriovenous brachiocephalic Fistula;  Surgeon: Angelia Mould, MD;  Location: [place];  Service: Vascular;  Laterality: Left;  . AV FISTULA PLACEMENT Left 09/01/2014   Procedure: INSERTION OF ARTERIOVENOUS (AV) GORE-TEX GRAFT LEFT UPPER ARM USING  4-7 MM X 45 CM SRTETCH GORETEX GRAFT;  Surgeon: Angelia Mould, MD;  Location: [place];  Service: Vascular;  Laterality: Left;  . CARDIAC CATHETERIZATION    . CAROTID ENDARTERECTOMY Left 2009    CEA  . carpel tunnel    . COLONOSCOPY  07/25/2011  Procedure: COLONOSCOPY;  Surgeon: Winfield Cunas., MD;  Location: [facility] ENDOSCOPY;  Service: Endoscopy;  Laterality: N/A;  . CORONARY ARTERY BYPASS GRAFT  07/29/2011   Procedure: CORONARY ARTERY BYPASS GRAFTING (CABG);  Surgeon: Gaye Pollack, MD;  Location: [place];  Service: Open Heart Surgery;  Laterality: N/A;  . ENDARTERECTOMY Right 04/21/2015   Procedure: ENDARTERECTOMY CAROTID;  Surgeon: Angelia Mould, MD;  Location: [place];  Service: Vascular;  Laterality: Right;  . ESOPHAGOGASTRODUODENOSCOPY  07/25/2011   Procedure: ESOPHAGOGASTRODUODENOSCOPY (EGD);  Surgeon: Winfield Cunas., MD;  Location: [facility] ENDOSCOPY;  Service: Endoscopy;  Laterality: N/A;  . EYE SURGERY    . FISTULOGRAM Left 08/29/2014   Procedure: FISTULOGRAM;  Surgeon: Angelia Mould, MD;  Location: [facility]  CATH LAB;  Service: Cardiovascular;  Laterality: Left;  . KNEE SURGERY Left   . LUMBAR LAMINECTOMY/DECOMPRESSION MICRODISCECTOMY N/A 12/01/2015   Procedure: L5-S1 Decompression and Bilateral Microdiscectomy;  Surgeon: Marybelle Killings, MD;  Location: [place];  Service: Orthopedics;  Laterality: N/A;  . NECK SURGERY    . TONSILLECTOMY       Current Outpatient Prescriptions  Medication Sig Dispense Refill  . acetaminophen (TYLENOL) 325 MG tablet Take 2 tablets (650 mg total) by mouth every 6 (six) hours as needed for mild pain.    Marland Kitchen allopurinol (ZYLOPRIM) 100 MG tablet Take 100 mg by mouth 2 (two) times daily. Reported on 12/28/2015    . aspirin 325 MG tablet Take 325 mg by mouth daily.    Marland Kitchen atorvastatin (LIPITOR) 10 MG tablet TAKE ONE TABLET BY MOUTH ONE TIME DAILY (Patient taking differently: Take 10 mg by mouth daily. ) 30 tablet 0  . B Complex Vitamins (VITAMIN B COMPLEX PO) Take 1 tablet by mouth daily.    . brimonidine (ALPHAGAN P) 0.1 % SOLN Place 1 drop into the left eye 2 (two) times daily.    . calcitRIOL (ROCALTROL) 0.25 MCG capsule Take 0.25 mcg by mouth daily.     . Cholecalciferol (VITAMIN D) 2000 UNITS tablet Take 2,000 Units by mouth 2 (two) times daily.    . citalopram (CELEXA) 20 MG tablet Take 20 mg by mouth daily.    . colchicine 0.6 MG tablet Take 0.6 mg by mouth daily as needed. Reported on 12/28/2015    . Cyanocobalamin (VITAMIN B 12 PO) Take 1,000 mg by mouth daily.     . fish oil-omega-3 fatty acids 1000 MG capsule Take 2 g by mouth daily.    . furosemide (LASIX) 80 MG tablet Take 1 tablet (80 mg total) by mouth daily. 30 tablet 1  . insulin glargine (LANTUS) 100 UNIT/ML injection Inject 0.2-0.3 mLs (20-30 Units total) into the skin at bedtime. (Patient taking differently: Inject 30 Units into the skin at bedtime. ) 10 mL 11  . isosorbide mononitrate (IMDUR) 30 MG 24 hr tablet Take 1 tablet (30 mg total) by mouth daily. 30 tablet 0  . Menthol-Methyl Salicylate (MUSCLE RUB)  10-15 % CREA Apply 1 application topically 2 (two) times daily.  0  . metoprolol (LOPRESSOR) 100 MG tablet Take 1 tablet (100 mg total) by mouth 2 (two) times daily. (Patient taking differently: Take 50 mg by mouth 2 (two) times daily. ) 60 tablet 0  . nitroGLYCERIN (NITROSTAT) 0.4 MG SL tablet Place 0.4 mg under the tongue every 5 (five) minutes as needed. Chest pain    . omeprazole (PRILOSEC) 20 MG capsule Take 20 mg by mouth daily as needed. Acid reflux    .  prazosin (MINIPRESS) 1 MG capsule TAKE 1 CAPSULE (1 MG TOTAL) BY MOUTH AT BEDTIME. 30 capsule 1  . sitaGLIPtin (JANUVIA) 25 MG tablet Take 25 mg by mouth daily. Reported on 12/28/2015    . timolol (TIMOPTIC) 0.5 % ophthalmic solution Place 1 drop into the left eye daily.    . travoprost, benzalkonium, (TRAVATAN) 0.004 % ophthalmic solution Place 1 drop into the left eye at bedtime.     No current facility-administered medications for this visit.     Allergies:   Gabapentin; Other; Sulfa antibiotics; and Amlodipine    Social History:  The patient  reports that she has never smoked. She has never used smokeless tobacco. She reports that she does not drink alcohol or use drugs.   Family History:  The patient's family history includes Cancer in her father; Deep vein thrombosis in her daughter; Diabetes in her daughter and mother; Heart attack in her mother; Heart disease in her daughter and mother; Hyperlipidemia in her daughter and father; Hypertension in her daughter, father, and mother; Peripheral vascular disease in her daughter; Stroke in her mother.    ROS:  Please see the history of present illness.   Otherwise, review of systems are positive for fatigue and sleeping a lot.   All other systems are reviewed and negative.    PHYSICAL EXAM: VS:  BP 138/70 (BP Location: Left Arm, Patient Position: Sitting, Cuff Size: Normal)   Pulse (!) 55   Ht 5\' 4"  (1.626 m)   Wt 184 lb (83.5 kg)   SpO2 94%   BMI 31.58 kg/m  , BMI Body mass index  is 31.58 kg/m. GEN: Well nourished, well developed, in no acute distress  HEENT: normal  Neck: no JVD, carotid bruits, or masses Cardiac: RRR; no murmurs, rubs, or gallops,; minimal ankle edema  Respiratory:  clear to auscultation bilaterally, normal work of breathing GI: soft, nontender, nondistended, + BS MS: no deformity or atrophy  Skin: warm and dry, no rash Neuro:  Strength and sensation are intact Psych: euthymic mood, full affect    Recent Labs: 12/08/2015: ALT 6 12/21/2015: B Natriuretic Peptide 796.6; Magnesium 1.3 12/24/2015: BUN 53; Creatinine, Ser 2.86; Platelets 134; Potassium 4.9; Sodium 139 08/28/2016: Hemoglobin 11.0   Lipid Panel    Component Value Date/Time   CHOL 177 07/13/2015 1630   TRIG 280 (H) 07/13/2015 1630   HDL 40 (L) 07/13/2015 1630   CHOLHDL 4.4 07/13/2015 1630   VLDL 56 (H) 07/13/2015 1630   LDLCALC 81 07/13/2015 1630     Other studies Reviewed: Additional studies/ records that were reviewed today with results demonstrating: monitor results reviewed.   ASSESSMENT AND PLAN:  1. CAD: No angina. Doing well post bypass surgery several years ago.  COntinue aggressive secondary prevention. 2. Paroxysmal atrial fibrillation: This was noted after her bypass surgery. No evidence of recurrent atrial fibrillation by 2017 monitor results. I don't think we have strong enough evidence to commit her to long-term anticoagulation at this point, unless she had documented atrial fibrillation. She has not had any recurrence of TIA symptoms since 2016. I asked her to check her blood pressure at home. If her blood pressure machine gives her an irregular pulse signed, she should let us know. She does check her pulse on a regular basis and has not noted any irregularity. 3. Hyperlipidemia: LDL well controlled in 2016. Triglycerides were elevated, but this was likely a nonfasting sample. Followed with PMD.  LDL target less than 100.  4. HTN: BP  well controlled. Continue  current medicines.  Fatigue may be related to beta blocker and bradycardia.  Decrease metoprolol to 25 mg BID to help fatigue.  If BP increases, would add low dose amlodipine 2.5 mg daily.  She likely had edema with higher doses of amlodipine and may tolerate the lower dose.  5. CRI: Cr is now in the 4.1 at last check.  No ACE-I.      Current medicines are reviewed at length with the patient today.  The patient concerns regarding her medicines were addressed.  The following changes have been made:  No change  Labs/ tests ordered today include:  No orders of the defined types were placed in this encounter.   Recommend 150 minutes/week of aerobic exercise Low fat, low carb, high fiber diet recommended  Disposition:   FU in 1 year   Signed, Larae Grooms, MD  09/16/2016 3:16 PM    [facility] [address], [place], [place]  [address] Phone: [phone]; Fax: [phone]

## 2016-09-16 ENCOUNTER — Ambulatory Visit (INDEPENDENT_AMBULATORY_CARE_PROVIDER_SITE_OTHER): Payer: Medicare Other | Admitting: Interventional Cardiology

## 2016-09-16 ENCOUNTER — Encounter: Payer: Self-pay | Admitting: Interventional Cardiology

## 2016-09-16 VITALS — BP 138/70 | HR 55 | Ht 64.0 in | Wt 184.0 lb

## 2016-09-16 DIAGNOSIS — E1159 Type 2 diabetes mellitus with other circulatory complications: Secondary | ICD-10-CM | POA: Diagnosis not present

## 2016-09-16 DIAGNOSIS — E782 Mixed hyperlipidemia: Secondary | ICD-10-CM

## 2016-09-16 DIAGNOSIS — I2510 Atherosclerotic heart disease of native coronary artery without angina pectoris: Secondary | ICD-10-CM | POA: Diagnosis not present

## 2016-09-16 DIAGNOSIS — Z794 Long term (current) use of insulin: Secondary | ICD-10-CM

## 2016-09-16 DIAGNOSIS — I480 Paroxysmal atrial fibrillation: Secondary | ICD-10-CM

## 2016-09-16 DIAGNOSIS — I10 Essential (primary) hypertension: Secondary | ICD-10-CM

## 2016-09-16 MED ORDER — METOPROLOL TARTRATE 25 MG PO TABS: 25.0000 mg | ORAL_TABLET | Freq: Two times a day (BID) | ORAL | 3 refills | Status: DC

## 2016-09-16 NOTE — Patient Instructions (Signed)
Medication Instructions:  Decrease Metoprolol to 25 mg twice daily. All other medications remain the same.  Labwork: None  Testing/Procedures: None  Follow-Up: Your physician wants you to follow-up in: 1 year. You will receive a reminder letter in the mail two months in advance. If you don't receive a letter, please call our office to schedule the follow-up appointment.   Any Other Special Instructions Will Be Listed Below (If Applicable). Please monitor your blood pressure at home and call us at [phone] if your blood pressure is consistently over 130/80.    If you need a refill on your cardiac medications before your next appointment, please call your pharmacy.

## 2016-09-18 ENCOUNTER — Encounter (HOSPITAL_COMMUNITY)
Admission: RE | Admit: 2016-09-18 | Discharge: 2016-09-18 | Disposition: A | Discharge status: Home / Self Care | Payer: Medicare Other | Source: Ambulatory Visit | Attending: Nephrology | Admitting: Nephrology

## 2016-09-18 DIAGNOSIS — D631 Anemia in chronic kidney disease: Secondary | ICD-10-CM | POA: Insufficient documentation

## 2016-09-18 DIAGNOSIS — N184 Chronic kidney disease, stage 4 (severe): Secondary | ICD-10-CM | POA: Insufficient documentation

## 2016-09-18 DIAGNOSIS — N185 Chronic kidney disease, stage 5: Secondary | ICD-10-CM

## 2016-09-18 LAB — POCT HEMOGLOBIN-HEMACUE: Hemoglobin: 10.8 g/dL — ABNORMAL LOW (ref 12.0–15.0)

## 2016-09-18 MED ORDER — EPOETIN ALFA 20000 UNIT/ML IJ SOLN
20000.0000 [IU] | INTRAMUSCULAR | Status: DC
  Administered 2016-09-18: 13:00:00 20000 [IU] via SUBCUTANEOUS

## 2016-09-18 MED ORDER — EPOETIN ALFA 20000 UNIT/ML IJ SOLN
INTRAMUSCULAR | Status: AC
  Filled 2016-09-18: qty 1

## 2016-10-09 ENCOUNTER — Encounter (HOSPITAL_COMMUNITY)
Admission: RE | Admit: 2016-10-09 | Discharge: 2016-10-09 | Disposition: A | Discharge status: Home / Self Care | Payer: Medicare Other | Source: Ambulatory Visit | Attending: Nephrology | Admitting: Nephrology

## 2016-10-09 DIAGNOSIS — N184 Chronic kidney disease, stage 4 (severe): Secondary | ICD-10-CM | POA: Diagnosis not present

## 2016-10-09 DIAGNOSIS — D631 Anemia in chronic kidney disease: Secondary | ICD-10-CM | POA: Diagnosis not present

## 2016-10-09 DIAGNOSIS — N185 Chronic kidney disease, stage 5: Secondary | ICD-10-CM

## 2016-10-09 LAB — POCT HEMOGLOBIN-HEMACUE: Hemoglobin: 10.5 g/dL — ABNORMAL LOW (ref 12.0–15.0)

## 2016-10-09 LAB — IRON AND TIBC
IRON: 69 ug/dL (ref 28–170)
SATURATION RATIOS: 25 % (ref 10.4–31.8)
TIBC: 274 ug/dL (ref 250–450)
UIBC: 205 ug/dL

## 2016-10-09 LAB — FERRITIN: Ferritin: 505 ng/mL — ABNORMAL HIGH (ref 11–307)

## 2016-10-09 MED ORDER — EPOETIN ALFA 20000 UNIT/ML IJ SOLN
INTRAMUSCULAR | Status: AC
  Administered 2016-10-09: 20000 [IU] via SUBCUTANEOUS
  Filled 2016-10-09: qty 1

## 2016-10-09 MED ORDER — EPOETIN ALFA 20000 UNIT/ML IJ SOLN
20000.0000 [IU] | INTRAMUSCULAR | Status: DC
  Administered 2016-10-09: 20000 [IU] via SUBCUTANEOUS

## 2016-10-17 DIAGNOSIS — E11319 Type 2 diabetes mellitus with unspecified diabetic retinopathy without macular edema: Secondary | ICD-10-CM | POA: Diagnosis not present

## 2016-10-17 DIAGNOSIS — E1165 Type 2 diabetes mellitus with hyperglycemia: Secondary | ICD-10-CM | POA: Diagnosis not present

## 2016-10-17 DIAGNOSIS — D81818 Other biotin-dependent carboxylase deficiency: Secondary | ICD-10-CM | POA: Diagnosis not present

## 2016-10-17 DIAGNOSIS — R55 Syncope and collapse: Secondary | ICD-10-CM | POA: Diagnosis not present

## 2016-10-17 DIAGNOSIS — E559 Vitamin D deficiency, unspecified: Secondary | ICD-10-CM | POA: Diagnosis not present

## 2016-10-17 DIAGNOSIS — E1121 Type 2 diabetes mellitus with diabetic nephropathy: Secondary | ICD-10-CM | POA: Diagnosis not present

## 2016-10-17 DIAGNOSIS — E0840 Diabetes mellitus due to underlying condition with diabetic neuropathy, unspecified: Secondary | ICD-10-CM | POA: Diagnosis not present

## 2016-10-17 DIAGNOSIS — I25111 Atherosclerotic heart disease of native coronary artery with angina pectoris with documented spasm: Secondary | ICD-10-CM | POA: Diagnosis not present

## 2016-10-17 DIAGNOSIS — M545 Low back pain: Secondary | ICD-10-CM | POA: Diagnosis not present

## 2016-10-17 DIAGNOSIS — D631 Anemia in chronic kidney disease: Secondary | ICD-10-CM | POA: Diagnosis not present

## 2016-10-17 DIAGNOSIS — Z794 Long term (current) use of insulin: Secondary | ICD-10-CM | POA: Diagnosis not present

## 2016-10-17 DIAGNOSIS — N184 Chronic kidney disease, stage 4 (severe): Secondary | ICD-10-CM | POA: Diagnosis not present

## 2016-10-30 ENCOUNTER — Encounter (HOSPITAL_COMMUNITY)
Admission: RE | Admit: 2016-10-30 | Discharge: 2016-10-30 | Disposition: A | Discharge status: Home / Self Care | Payer: Medicare Other | Source: Ambulatory Visit | Attending: Nephrology | Admitting: Nephrology

## 2016-10-30 DIAGNOSIS — D631 Anemia in chronic kidney disease: Secondary | ICD-10-CM | POA: Insufficient documentation

## 2016-10-30 DIAGNOSIS — N184 Chronic kidney disease, stage 4 (severe): Secondary | ICD-10-CM | POA: Diagnosis not present

## 2016-10-30 DIAGNOSIS — N185 Chronic kidney disease, stage 5: Secondary | ICD-10-CM

## 2016-10-30 LAB — POCT HEMOGLOBIN-HEMACUE: HEMOGLOBIN: 10.7 g/dL — AB (ref 12.0–15.0)

## 2016-10-30 MED ORDER — EPOETIN ALFA 20000 UNIT/ML IJ SOLN
INTRAMUSCULAR | Status: AC
  Administered 2016-10-30: 20000 [IU] via SUBCUTANEOUS
  Filled 2016-10-30: qty 1

## 2016-10-30 MED ORDER — EPOETIN ALFA 20000 UNIT/ML IJ SOLN
20000.0000 [IU] | INTRAMUSCULAR | Status: DC
  Administered 2016-10-30: 20000 [IU] via SUBCUTANEOUS

## 2016-11-04 DIAGNOSIS — L570 Actinic keratosis: Secondary | ICD-10-CM | POA: Diagnosis not present

## 2016-11-04 DIAGNOSIS — L821 Other seborrheic keratosis: Secondary | ICD-10-CM | POA: Diagnosis not present

## 2016-11-04 DIAGNOSIS — H401131 Primary open-angle glaucoma, bilateral, mild stage: Secondary | ICD-10-CM | POA: Diagnosis not present

## 2016-11-05 DIAGNOSIS — I129 Hypertensive chronic kidney disease with stage 1 through stage 4 chronic kidney disease, or unspecified chronic kidney disease: Secondary | ICD-10-CM | POA: Diagnosis not present

## 2016-11-05 DIAGNOSIS — E669 Obesity, unspecified: Secondary | ICD-10-CM | POA: Diagnosis not present

## 2016-11-05 DIAGNOSIS — N184 Chronic kidney disease, stage 4 (severe): Secondary | ICD-10-CM | POA: Diagnosis not present

## 2016-11-05 DIAGNOSIS — N2581 Secondary hyperparathyroidism of renal origin: Secondary | ICD-10-CM | POA: Diagnosis not present

## 2016-11-05 DIAGNOSIS — D631 Anemia in chronic kidney disease: Secondary | ICD-10-CM | POA: Diagnosis not present

## 2016-11-13 ENCOUNTER — Encounter (HOSPITAL_COMMUNITY): Payer: Medicare Other

## 2016-11-13 ENCOUNTER — Ambulatory Visit: Payer: Medicare Other | Admitting: Vascular Surgery

## 2016-11-19 DIAGNOSIS — H35372 Puckering of macula, left eye: Secondary | ICD-10-CM | POA: Diagnosis not present

## 2016-11-19 DIAGNOSIS — E113592 Type 2 diabetes mellitus with proliferative diabetic retinopathy without macular edema, left eye: Secondary | ICD-10-CM | POA: Diagnosis not present

## 2016-11-19 DIAGNOSIS — H3561 Retinal hemorrhage, right eye: Secondary | ICD-10-CM | POA: Diagnosis not present

## 2016-11-19 DIAGNOSIS — E113491 Type 2 diabetes mellitus with severe nonproliferative diabetic retinopathy without macular edema, right eye: Secondary | ICD-10-CM | POA: Diagnosis not present

## 2016-11-20 ENCOUNTER — Encounter (HOSPITAL_COMMUNITY)
Admission: RE | Admit: 2016-11-20 | Discharge: 2016-11-20 | Disposition: A | Discharge status: Home / Self Care | Payer: Medicare Other | Source: Ambulatory Visit | Attending: Nephrology | Admitting: Nephrology

## 2016-11-20 DIAGNOSIS — D631 Anemia in chronic kidney disease: Secondary | ICD-10-CM | POA: Insufficient documentation

## 2016-11-20 DIAGNOSIS — N185 Chronic kidney disease, stage 5: Secondary | ICD-10-CM

## 2016-11-20 DIAGNOSIS — N184 Chronic kidney disease, stage 4 (severe): Secondary | ICD-10-CM | POA: Insufficient documentation

## 2016-11-20 LAB — POCT HEMOGLOBIN-HEMACUE: HEMOGLOBIN: 10.9 g/dL — AB (ref 12.0–15.0)

## 2016-11-20 MED ORDER — EPOETIN ALFA 20000 UNIT/ML IJ SOLN
INTRAMUSCULAR | Status: AC
  Administered 2016-11-20: 12:00:00 20000 [IU] via SUBCUTANEOUS
  Filled 2016-11-20: qty 1

## 2016-11-20 MED ORDER — EPOETIN ALFA 20000 UNIT/ML IJ SOLN
20000.0000 [IU] | INTRAMUSCULAR | Status: DC
  Administered 2016-11-20: 20000 [IU] via SUBCUTANEOUS

## 2016-12-11 ENCOUNTER — Encounter (HOSPITAL_COMMUNITY)
Admission: RE | Admit: 2016-12-11 | Discharge: 2016-12-11 | Disposition: A | Discharge status: Home / Self Care | Payer: Medicare Other | Source: Ambulatory Visit | Attending: Nephrology | Admitting: Nephrology

## 2016-12-11 DIAGNOSIS — D631 Anemia in chronic kidney disease: Secondary | ICD-10-CM | POA: Diagnosis not present

## 2016-12-11 DIAGNOSIS — N184 Chronic kidney disease, stage 4 (severe): Secondary | ICD-10-CM | POA: Diagnosis not present

## 2016-12-11 DIAGNOSIS — N185 Chronic kidney disease, stage 5: Secondary | ICD-10-CM

## 2016-12-11 LAB — IRON AND TIBC
Iron: 68 ug/dL (ref 28–170)
SATURATION RATIOS: 27 % (ref 10.4–31.8)
TIBC: 252 ug/dL (ref 250–450)
UIBC: 184 ug/dL

## 2016-12-11 LAB — POCT HEMOGLOBIN-HEMACUE: Hemoglobin: 10.4 g/dL — ABNORMAL LOW (ref 12.0–15.0)

## 2016-12-11 MED ORDER — EPOETIN ALFA 20000 UNIT/ML IJ SOLN
20000.0000 [IU] | INTRAMUSCULAR | Status: DC
  Administered 2016-12-11: 20000 [IU] via SUBCUTANEOUS

## 2016-12-11 MED ORDER — EPOETIN ALFA 20000 UNIT/ML IJ SOLN
INTRAMUSCULAR | Status: AC
  Filled 2016-12-11: qty 1

## 2017-01-01 ENCOUNTER — Encounter (HOSPITAL_COMMUNITY)
Admission: RE | Admit: 2017-01-01 | Discharge: 2017-01-01 | Disposition: A | Discharge status: Home / Self Care | Payer: Medicare Other | Source: Ambulatory Visit | Attending: Nephrology | Admitting: Nephrology

## 2017-01-01 DIAGNOSIS — D631 Anemia in chronic kidney disease: Secondary | ICD-10-CM | POA: Insufficient documentation

## 2017-01-01 DIAGNOSIS — N184 Chronic kidney disease, stage 4 (severe): Secondary | ICD-10-CM | POA: Diagnosis not present

## 2017-01-01 DIAGNOSIS — N185 Chronic kidney disease, stage 5: Secondary | ICD-10-CM

## 2017-01-01 LAB — POCT HEMOGLOBIN-HEMACUE: Hemoglobin: 10.4 g/dL — ABNORMAL LOW (ref 12.0–15.0)

## 2017-01-01 MED ORDER — EPOETIN ALFA 20000 UNIT/ML IJ SOLN
20000.0000 [IU] | INTRAMUSCULAR | Status: DC
  Administered 2017-01-01: 12:00:00 20000 [IU] via SUBCUTANEOUS

## 2017-01-01 MED ORDER — EPOETIN ALFA 20000 UNIT/ML IJ SOLN
INTRAMUSCULAR | Status: AC
  Filled 2017-01-01: qty 1

## 2017-01-02 DIAGNOSIS — Z6832 Body mass index (BMI) 32.0-32.9, adult: Secondary | ICD-10-CM | POA: Diagnosis not present

## 2017-01-02 DIAGNOSIS — E669 Obesity, unspecified: Secondary | ICD-10-CM | POA: Diagnosis not present

## 2017-01-02 DIAGNOSIS — N184 Chronic kidney disease, stage 4 (severe): Secondary | ICD-10-CM | POA: Diagnosis not present

## 2017-01-02 DIAGNOSIS — N2581 Secondary hyperparathyroidism of renal origin: Secondary | ICD-10-CM | POA: Diagnosis not present

## 2017-01-02 DIAGNOSIS — D631 Anemia in chronic kidney disease: Secondary | ICD-10-CM | POA: Diagnosis not present

## 2017-01-02 DIAGNOSIS — I129 Hypertensive chronic kidney disease with stage 1 through stage 4 chronic kidney disease, or unspecified chronic kidney disease: Secondary | ICD-10-CM | POA: Diagnosis not present

## 2017-01-22 ENCOUNTER — Encounter (HOSPITAL_COMMUNITY)
Admission: RE | Admit: 2017-01-22 | Discharge: 2017-01-22 | Disposition: A | Discharge status: Home / Self Care | Payer: Medicare Other | Source: Ambulatory Visit | Attending: Nephrology | Admitting: Nephrology

## 2017-01-22 DIAGNOSIS — D631 Anemia in chronic kidney disease: Secondary | ICD-10-CM | POA: Diagnosis not present

## 2017-01-22 DIAGNOSIS — N185 Chronic kidney disease, stage 5: Secondary | ICD-10-CM

## 2017-01-22 DIAGNOSIS — N184 Chronic kidney disease, stage 4 (severe): Secondary | ICD-10-CM | POA: Insufficient documentation

## 2017-01-22 LAB — POCT HEMOGLOBIN-HEMACUE: HEMOGLOBIN: 10.4 g/dL — AB (ref 12.0–15.0)

## 2017-01-22 MED ORDER — EPOETIN ALFA 20000 UNIT/ML IJ SOLN
INTRAMUSCULAR | Status: AC
  Administered 2017-01-22: 12:00:00 20000 [IU] via SUBCUTANEOUS
  Filled 2017-01-22: qty 1

## 2017-01-22 MED ORDER — EPOETIN ALFA 20000 UNIT/ML IJ SOLN
20000.0000 [IU] | INTRAMUSCULAR | Status: DC
  Administered 2017-01-22: 20000 [IU] via SUBCUTANEOUS

## 2017-01-25 DIAGNOSIS — S0990XA Unspecified injury of head, initial encounter: Secondary | ICD-10-CM | POA: Diagnosis not present

## 2017-01-25 DIAGNOSIS — I4510 Unspecified right bundle-branch block: Secondary | ICD-10-CM | POA: Diagnosis not present

## 2017-01-25 DIAGNOSIS — S61412A Laceration without foreign body of left hand, initial encounter: Secondary | ICD-10-CM | POA: Diagnosis not present

## 2017-01-25 DIAGNOSIS — S0181XA Laceration without foreign body of other part of head, initial encounter: Secondary | ICD-10-CM | POA: Diagnosis not present

## 2017-01-25 DIAGNOSIS — S06360A Traumatic hemorrhage of cerebrum, unspecified, without loss of consciousness, initial encounter: Secondary | ICD-10-CM | POA: Diagnosis not present

## 2017-01-25 DIAGNOSIS — T1490XA Injury, unspecified, initial encounter: Secondary | ICD-10-CM | POA: Diagnosis not present

## 2017-01-25 DIAGNOSIS — I69331 Monoplegia of upper limb following cerebral infarction affecting right dominant side: Secondary | ICD-10-CM | POA: Diagnosis not present

## 2017-01-25 DIAGNOSIS — I129 Hypertensive chronic kidney disease with stage 1 through stage 4 chronic kidney disease, or unspecified chronic kidney disease: Secondary | ICD-10-CM | POA: Diagnosis not present

## 2017-01-25 DIAGNOSIS — I2510 Atherosclerotic heart disease of native coronary artery without angina pectoris: Secondary | ICD-10-CM | POA: Diagnosis present

## 2017-01-25 DIAGNOSIS — S06360D Traumatic hemorrhage of cerebrum, unspecified, without loss of consciousness, subsequent encounter: Secondary | ICD-10-CM | POA: Diagnosis not present

## 2017-01-25 DIAGNOSIS — S062X0A Diffuse traumatic brain injury without loss of consciousness, initial encounter: Secondary | ICD-10-CM | POA: Diagnosis not present

## 2017-01-25 DIAGNOSIS — E1122 Type 2 diabetes mellitus with diabetic chronic kidney disease: Secondary | ICD-10-CM | POA: Diagnosis not present

## 2017-01-25 DIAGNOSIS — Z951 Presence of aortocoronary bypass graft: Secondary | ICD-10-CM | POA: Diagnosis not present

## 2017-01-25 DIAGNOSIS — S066X0A Traumatic subarachnoid hemorrhage without loss of consciousness, initial encounter: Secondary | ICD-10-CM | POA: Diagnosis not present

## 2017-01-25 DIAGNOSIS — S0081XA Abrasion of other part of head, initial encounter: Secondary | ICD-10-CM | POA: Diagnosis not present

## 2017-01-25 DIAGNOSIS — I609 Nontraumatic subarachnoid hemorrhage, unspecified: Secondary | ICD-10-CM

## 2017-01-25 DIAGNOSIS — I10 Essential (primary) hypertension: Secondary | ICD-10-CM | POA: Diagnosis not present

## 2017-01-25 DIAGNOSIS — E785 Hyperlipidemia, unspecified: Secondary | ICD-10-CM | POA: Diagnosis present

## 2017-01-25 DIAGNOSIS — Z888 Allergy status to other drugs, medicaments and biological substances status: Secondary | ICD-10-CM | POA: Diagnosis not present

## 2017-01-25 DIAGNOSIS — M109 Gout, unspecified: Secondary | ICD-10-CM | POA: Diagnosis not present

## 2017-01-25 DIAGNOSIS — Z9181 History of falling: Secondary | ICD-10-CM | POA: Diagnosis not present

## 2017-01-25 DIAGNOSIS — W010XXA Fall on same level from slipping, tripping and stumbling without subsequent striking against object, initial encounter: Secondary | ICD-10-CM | POA: Diagnosis not present

## 2017-01-25 DIAGNOSIS — Z794 Long term (current) use of insulin: Secondary | ICD-10-CM | POA: Diagnosis not present

## 2017-01-25 DIAGNOSIS — R9431 Abnormal electrocardiogram [ECG] [EKG]: Secondary | ICD-10-CM | POA: Diagnosis not present

## 2017-01-25 DIAGNOSIS — I160 Hypertensive urgency: Secondary | ICD-10-CM | POA: Diagnosis not present

## 2017-01-25 DIAGNOSIS — S098XXD Other specified injuries of head, subsequent encounter: Secondary | ICD-10-CM | POA: Diagnosis not present

## 2017-01-25 DIAGNOSIS — S098XXA Other specified injuries of head, initial encounter: Secondary | ICD-10-CM | POA: Diagnosis not present

## 2017-01-25 DIAGNOSIS — E119 Type 2 diabetes mellitus without complications: Secondary | ICD-10-CM | POA: Diagnosis not present

## 2017-01-25 DIAGNOSIS — S51812A Laceration without foreign body of left forearm, initial encounter: Secondary | ICD-10-CM | POA: Diagnosis not present

## 2017-01-25 DIAGNOSIS — S062X9A Diffuse traumatic brain injury with loss of consciousness of unspecified duration, initial encounter: Secondary | ICD-10-CM | POA: Diagnosis not present

## 2017-01-25 DIAGNOSIS — N184 Chronic kidney disease, stage 4 (severe): Secondary | ICD-10-CM | POA: Diagnosis not present

## 2017-01-25 DIAGNOSIS — W19XXXA Unspecified fall, initial encounter: Secondary | ICD-10-CM | POA: Diagnosis not present

## 2017-01-25 DIAGNOSIS — W19XXXD Unspecified fall, subsequent encounter: Secondary | ICD-10-CM | POA: Diagnosis not present

## 2017-01-25 HISTORY — DX: Nontraumatic subarachnoid hemorrhage, unspecified: I60.9

## 2017-01-29 DIAGNOSIS — S0181XD Laceration without foreign body of other part of head, subsequent encounter: Secondary | ICD-10-CM | POA: Diagnosis not present

## 2017-01-29 DIAGNOSIS — Z951 Presence of aortocoronary bypass graft: Secondary | ICD-10-CM | POA: Diagnosis not present

## 2017-01-29 DIAGNOSIS — Z794 Long term (current) use of insulin: Secondary | ICD-10-CM | POA: Diagnosis not present

## 2017-01-29 DIAGNOSIS — N184 Chronic kidney disease, stage 4 (severe): Secondary | ICD-10-CM | POA: Diagnosis not present

## 2017-01-29 DIAGNOSIS — I2510 Atherosclerotic heart disease of native coronary artery without angina pectoris: Secondary | ICD-10-CM | POA: Diagnosis not present

## 2017-01-29 DIAGNOSIS — I129 Hypertensive chronic kidney disease with stage 1 through stage 4 chronic kidney disease, or unspecified chronic kidney disease: Secondary | ICD-10-CM | POA: Diagnosis not present

## 2017-01-29 DIAGNOSIS — I69331 Monoplegia of upper limb following cerebral infarction affecting right dominant side: Secondary | ICD-10-CM | POA: Diagnosis not present

## 2017-01-29 DIAGNOSIS — E1122 Type 2 diabetes mellitus with diabetic chronic kidney disease: Secondary | ICD-10-CM | POA: Diagnosis not present

## 2017-01-29 DIAGNOSIS — W19XXXD Unspecified fall, subsequent encounter: Secondary | ICD-10-CM | POA: Diagnosis not present

## 2017-01-29 DIAGNOSIS — S066X9D Traumatic subarachnoid hemorrhage with loss of consciousness of unspecified duration, subsequent encounter: Secondary | ICD-10-CM | POA: Diagnosis not present

## 2017-01-30 DIAGNOSIS — I609 Nontraumatic subarachnoid hemorrhage, unspecified: Secondary | ICD-10-CM | POA: Diagnosis not present

## 2017-01-30 DIAGNOSIS — R269 Unspecified abnormalities of gait and mobility: Secondary | ICD-10-CM | POA: Diagnosis not present

## 2017-01-30 DIAGNOSIS — E7800 Pure hypercholesterolemia, unspecified: Secondary | ICD-10-CM | POA: Diagnosis not present

## 2017-01-30 DIAGNOSIS — N184 Chronic kidney disease, stage 4 (severe): Secondary | ICD-10-CM | POA: Diagnosis not present

## 2017-01-30 DIAGNOSIS — M25572 Pain in left ankle and joints of left foot: Secondary | ICD-10-CM | POA: Diagnosis not present

## 2017-01-31 DIAGNOSIS — S0181XD Laceration without foreign body of other part of head, subsequent encounter: Secondary | ICD-10-CM | POA: Diagnosis not present

## 2017-01-31 DIAGNOSIS — E1122 Type 2 diabetes mellitus with diabetic chronic kidney disease: Secondary | ICD-10-CM | POA: Diagnosis not present

## 2017-01-31 DIAGNOSIS — I69331 Monoplegia of upper limb following cerebral infarction affecting right dominant side: Secondary | ICD-10-CM | POA: Diagnosis not present

## 2017-01-31 DIAGNOSIS — S066X9D Traumatic subarachnoid hemorrhage with loss of consciousness of unspecified duration, subsequent encounter: Secondary | ICD-10-CM | POA: Diagnosis not present

## 2017-01-31 DIAGNOSIS — I2510 Atherosclerotic heart disease of native coronary artery without angina pectoris: Secondary | ICD-10-CM | POA: Diagnosis not present

## 2017-01-31 DIAGNOSIS — I129 Hypertensive chronic kidney disease with stage 1 through stage 4 chronic kidney disease, or unspecified chronic kidney disease: Secondary | ICD-10-CM | POA: Diagnosis not present

## 2017-02-03 ENCOUNTER — Ambulatory Visit (INDEPENDENT_AMBULATORY_CARE_PROVIDER_SITE_OTHER): Payer: Medicare Other | Admitting: Neurology

## 2017-02-03 ENCOUNTER — Encounter: Payer: Self-pay | Admitting: Neurology

## 2017-02-03 VITALS — BP 136/87 | HR 56 | Wt 183.8 lb

## 2017-02-03 DIAGNOSIS — E119 Type 2 diabetes mellitus without complications: Secondary | ICD-10-CM

## 2017-02-03 DIAGNOSIS — E785 Hyperlipidemia, unspecified: Secondary | ICD-10-CM | POA: Diagnosis not present

## 2017-02-03 DIAGNOSIS — I609 Nontraumatic subarachnoid hemorrhage, unspecified: Secondary | ICD-10-CM | POA: Insufficient documentation

## 2017-02-03 DIAGNOSIS — I10 Essential (primary) hypertension: Secondary | ICD-10-CM | POA: Diagnosis not present

## 2017-02-03 DIAGNOSIS — I2510 Atherosclerotic heart disease of native coronary artery without angina pectoris: Secondary | ICD-10-CM | POA: Diagnosis not present

## 2017-02-03 DIAGNOSIS — W19XXXA Unspecified fall, initial encounter: Secondary | ICD-10-CM | POA: Insufficient documentation

## 2017-02-03 DIAGNOSIS — W19XXXD Unspecified fall, subsequent encounter: Secondary | ICD-10-CM

## 2017-02-03 NOTE — Progress Notes (Signed)
STROKE NEUROLOGY FOLLOW UP NOTE  NAME: [name] DOB: [birth year]  REASON FOR VISIT: stroke follow up HISTORY FROM: pt and husband and chart  Today we had the pleasure of seeing [name] in follow-up at our Neurology Clinic. Pt was accompanied by husband.   History Summary [name] is a 76 y.o. female with history of HTN, DM, CAD s/p CABG with brief afib not on anticoagulation, bilateral carotid artery stenosis and CEAs, and TIA was admitted on 07/13/15 for abnormal speech output and confusion. She did not receive IV t-PA due to minimal deficits.MRI negative for stroke. MRA no large vessel occlusion. CUS, TTE, EEG x 2 all unremarkable. LDL 81 and A1C 7.0. Her symptoms were not considered as TIA at that time but more likely encephalopathy and acute delirium due to AKI vs. Possible bacteremia vs. Possible seizure with prolonged post ictal. She was treated with vanco with trending down WBC and afebrile. Her AKI also much improved. Her symptoms gradually improved over time and more awake and alert and orientated. She was then discharged with continued ASA and lipitor. Of note, she did have brief afib s/p CABG procedure but there is no further evidence of further afib as per her cardiologist Dr. Irish Lack, so anticoagulation was not considered on discharge.   09/21/15 follow up - the patient has been doing well. No recurrent AMS or seizure or stroke. Check BP at home was good, today BP 126/59. Glucose at home around 170s, not compliant with diabetic diet. Has follow up with Dr. Irish Lack and had 30 day cardiac event monitoring and no afib found. Continued on ASA and lipitor.    02/05/16 follow up - pt had LB surgery in 11/2015. She stayed in rehab for 2 weeks before back home. Currently finished home PT/OT, but she still complains of left hip and back pain on walking. She came in today with wheelchair, but she is able to walk short distance with walker at home. Her BP high today 170/60  and she attributed it to bacon she ate recently. Her glucose in better control, today at home 88. She is going to see Dr. Inda Merlin next month. So far husband declined outpt PT/OT and they would like to discuss with Dr. Inda Merlin.  Interval History During the interval time, patient has been doing well until 01/25/2017 when she had a fall in an [employer] store. Apparently, it was a mechanical fall when she made a turn and falling on her left. She was using cane at the time She struck her left forehead to the ground, needed sutures at left frontal above eyebrow. Head CT showed bilateral small frontal SAH, denies LOC, headache, or focal neurological deficit. Neurosurgery consulted, aspirin on hold, discharge with possible resume aspirin 2 weeks.  Since discharge, she has been doing well. No HA or cognitive impairment. She will go to [place] with family for vacation. BP 135/57. Stated her BP at home controlled well. Since the incident, she is walking with walker.   REVIEW OF SYSTEMS: Full 14 system review of systems performed and notable only for those listed below and in HPI above, all others are negative:  Constitutional:  Activity change, fatigue Cardiovascular:  Ear/Nose/Throat:  Hearing loss, runny nose Skin:  Eyes:   Respiratory:  SOB Gastroitestinal:  Diarrhea Genitourinary:  Hematology/Lymphatic:  Bleeding easily Endocrine: feeling cold Musculoskeletal:  Joint pain, back pain, walking difficulties, neck pain Allergy/Immunology:   Neurological:  Weakness, tremor Psychiatric: Confusion, nervousness, anxiety Sleep:  Daytime sleepiness, snoring, sleep talking  The following represents the patient's updated allergies and side effects list: Allergies  Allergen Reactions  . Gabapentin Other (See Comments)    hallucinations  . Other     Other reaction(s): Fatigue, Gout, Hallucintions  . Sulfa Antibiotics     Other reaction(s): psychiatric disturbances  . Amlodipine Other (See Comments)     edema    The neurologically relevant items on the patient's problem list were reviewed on today's visit.  Neurologic Examination  A problem focused neurological exam (12 or more points of the single system neurologic examination, vital signs counts as 1 point, cranial nerves count for 8 points) was performed.  Blood pressure 136/87, pulse (!) 56, weight 183 lb 12.8 oz (83.4 kg).  General - Well nourished, well developed, in no apparent distress. Left frontal with several sutures  Ophthalmologic - Fundi not visualized due to small pupils.  Cardiovascular - Regular rate and rhythm with no murmur.  Mental Status -  Level of arousal and orientation to time, place, and person were intact. Language including expression, naming, repetition, comprehension was assessed and found intact. Fund of Knowledge was assessed and was intact.  Cranial Nerves II - XII - II - Visual field intact OU. III, IV, VI - Extraocular movements intact. V - Facial sensation intact bilaterally. VII - Facial movement intact bilaterally. VIII - Hard of hearing & vestibular intact bilaterally. X - Palate elevates symmetrically. XI - Chin turning & shoulder shrug intact bilaterally. XII - Tongue protrusion intact.  Motor Strength - The patient's strength was normal in BUEs and 4+/5 BLEs and pronator drift was absent.  Bulk was normal and fasciculations were absent.   Motor Tone - Muscle tone was assessed at the neck and appendages and was normal.  Reflexes - The patient's reflexes were 1+ in all extremities and she had no pathological reflexes.  Sensory - Light touch, temperature/pinprick were assessed and were normal.    Coordination - The patient had normal movements in the hands and feet with no ataxia or dysmetria.  Tremor was absent.  Gait and Station - walk with walker, steady, small stride.   Data reviewed: I personally reviewed the images and agree with the radiology interpretations.  Ct Head Wo  Contrast 07/13/2015 No acute intracranial abnormality noted.   MRI HEAD 07/13/2015 : No acute intracranial process, specifically no acute ischemia on this mildly motion degraded examination. Negative noncontrast MRI brain for age.   MRA HEAD 07/13/2015 No acute large vessel occlusion or definite high-grade stenosis on this moderately motion degraded examination.   EEG 07/15/15 - This EEG is abnormal with mild nonspecific generalized slowing of cerebral activity which can be seen with metabolic as well as toxic and degenerative encephalopathies. No evidence of an epileptic disorder was demonstrated. Compared to previous study on 07/14/2015, there is slight improvement in background cerebral activity.  EEG 07/14/15- this is an abnormal awake and asleep EEG that demonstrated findings consistent with a mild non specific encephalopathy. No electrographic seizures seen.  CUS - Findings suggest 1-39% internal carotid artery stenosis bilaterally. The left vertebral artery is patent with antegrade flow. Unable to visualize the right vertebral artery.  TTE - Left ventricle: The cavity size was normal. Wall thickness was normal. Systolic function was normal. The estimated ejection fraction was in the range of 55% to 60%. - Mitral valve: Calcified annulus. Mildly thickened leaflets . - Left atrium: The atrium was mildly dilated. - Atrial septum: No defect or patent  foramen ovale was identified.  30 day cardia event monitoring - no afib  CT head 01/26/17 Small amount of subarachnoid hemorrhage adjacent to both frontal lobes without mass effect. No midline shift. Senescent changes. Mild sinusitis.  Component     Latest Ref Rng 07/13/2015 07/14/2015  Cholesterol     0 - 200 mg/dL 177   Triglycerides     <150 mg/dL 280 (H)   HDL Cholesterol     >40 mg/dL 40 (L)   Total CHOL/HDL Ratio      4.4   VLDL     0 - 40 mg/dL 56 (H)   LDL (calc)     0 - 99 mg/dL 81   Hemoglobin A1C     4.8  - 5.6 % 7.0 (H)   Mean Plasma Glucose      154   Ammonia     9 - 35 umol/L  14  TSH     0.350 - 4.500 uIU/mL  2.039  T4,Free(Direct)     0.61 - 1.12 ng/dL  0.77  Vitamin B12     180 - 914 pg/mL  4206 (H)  RPR     Non Reactive  Non Reactive  HIV     Non Reactive  Non Reactive  Thyroglobulin Antibody     0.0 - 0.9 IU/mL  <1.0  Thyroperoxidase Ab SerPl-aCnc     0 - 34 IU/mL  21    Assessment: As you may recall, she is a 76 y.o. Caucasian female with PMH of HTN, DM, CAD s/p CABG with brief afib not on anticoagulation, bilateral carotid artery stenosis and CEAs, and TIA was admitted on 07/13/15 for abnormal speech and confusion. MRI negative for stroke. MRA no large vessel occlusion. CUS, TTE, EEG x 2 all unremarkable. LDL 81 and A1C 7.0. Her symptoms were more likely encephalopathy and acute delirium due to AKI vs. Possible bacteremia vs. Possible seizure with prolonged post ictal. EEG negative. Her symptoms gradually improved over time and more awake and alert and orientated. She was then discharged with continued ASA and lipitor. Of note, she did have brief afib s/p CABG procedure but there is no evidence of further afib. She follow up with Dr. Irish Lack and had 30 day cardiac event monitoring and no afib found. Had recent LB surgery and still has back and left hip pain. Finished home PT/OT. BP and glucose in good control.  Had recent mechanical fall on 01/25/17, resulting of bilateral small frontal SAH. However no focal neurological deficit. Aspirin on hold for now. Will repeat CT head, if SAH resolved, will resume aspirin. Encourage walk with walker.  Plan:  - continue lipitor for stroke prevention. - hold off ASA for now. Will repeat CT head, once blood absorbed, will restart ASA. - check BP and glucose at home and record. - follow up with Cardiology regularly. - Follow up with your primary care physician for stroke risk factor modification. Recommend maintain blood pressure goal  <130/80, diabetes with hemoglobin A1c goal below 6.5% and lipids with LDL cholesterol goal below 70 mg/dL.  - use the walker for safety and avoid fall.  - follow up in 3 months.  I spent more than 25 minutes of face to face time with the patient. Greater than 50% of time was spent in counseling and coordination of care. We discuss about 60 repeat, aspirin management, walk with walker and avoid fall   Orders Placed This Encounter  Procedures  . CT HEAD WO CONTRAST  Standing Status:   Future    Standing Expiration Date:   05/06/2018    Order Specific Question:   Reason for Exam (SYMPTOM  OR DIAGNOSIS REQUIRED)    Answer:   SAH follow up    Order Specific Question:   Preferred imaging location?    Answer:   [id number]    Order Specific Question:   Radiology Contrast Protocol - do NOT remove file path    Answer:   [email or web address]    No orders of the defined types were placed in this encounter.   Patient Instructions  - continue lipitor for stroke prevention. - hold off ASA for now. Will repeat CT head once you come back from [facility]. If blood absorbed, will restart ASA. - check BP and glucose at home and record. - follow up with Cardiology regularly. - Follow up with your primary care physician for stroke risk factor modification. Recommend maintain blood pressure goal <130/80, diabetes with hemoglobin A1c goal below 6.5% and lipids with LDL cholesterol goal below 70 mg/dL.  - finish off the PT/OT at home and then self exercise at home.  - use the walker for safety and avoid fall.  - let us know if you want to have sleep study done.  - follow up in 3 months.   Rosalin Hawking, MD PhD [facility] [address], [place], [place] [address] [phone]

## 2017-02-03 NOTE — Patient Instructions (Addendum)
-   continue lipitor for stroke prevention. - hold off ASA for now. Will repeat CT head once you come back from [facility]. If blood absorbed, will restart ASA. - check BP and glucose at home and record. - follow up with Cardiology regularly. - Follow up with your primary care physician for stroke risk factor modification. Recommend maintain blood pressure goal <130/80, diabetes with hemoglobin A1c goal below 6.5% and lipids with LDL cholesterol goal below 70 mg/dL.  - finish off the PT/OT at home and then self exercise at home.  - use the walker for safety and avoid fall.  - let us know if you want to have sleep study done.  - follow up in 3 months.

## 2017-02-05 ENCOUNTER — Encounter (HOSPITAL_COMMUNITY): Payer: Medicare Other

## 2017-02-05 ENCOUNTER — Ambulatory Visit: Payer: Medicare Other | Admitting: Vascular Surgery

## 2017-02-10 ENCOUNTER — Other Ambulatory Visit: Payer: Medicare Other

## 2017-02-10 ENCOUNTER — Ambulatory Visit
Admission: RE | Admit: 2017-02-10 | Discharge: 2017-02-10 | Disposition: A | Discharge status: Home / Self Care | Payer: Medicare Other | Source: Ambulatory Visit | Attending: Neurology | Admitting: Neurology

## 2017-02-10 DIAGNOSIS — I609 Nontraumatic subarachnoid hemorrhage, unspecified: Secondary | ICD-10-CM

## 2017-02-10 DIAGNOSIS — E1122 Type 2 diabetes mellitus with diabetic chronic kidney disease: Secondary | ICD-10-CM | POA: Diagnosis not present

## 2017-02-10 DIAGNOSIS — R42 Dizziness and giddiness: Secondary | ICD-10-CM | POA: Diagnosis not present

## 2017-02-10 DIAGNOSIS — S066X9D Traumatic subarachnoid hemorrhage with loss of consciousness of unspecified duration, subsequent encounter: Secondary | ICD-10-CM | POA: Diagnosis not present

## 2017-02-10 DIAGNOSIS — I69331 Monoplegia of upper limb following cerebral infarction affecting right dominant side: Secondary | ICD-10-CM | POA: Diagnosis not present

## 2017-02-10 DIAGNOSIS — I2510 Atherosclerotic heart disease of native coronary artery without angina pectoris: Secondary | ICD-10-CM | POA: Diagnosis not present

## 2017-02-10 DIAGNOSIS — I129 Hypertensive chronic kidney disease with stage 1 through stage 4 chronic kidney disease, or unspecified chronic kidney disease: Secondary | ICD-10-CM | POA: Diagnosis not present

## 2017-02-10 DIAGNOSIS — S0181XD Laceration without foreign body of other part of head, subsequent encounter: Secondary | ICD-10-CM | POA: Diagnosis not present

## 2017-02-11 DIAGNOSIS — S066X9D Traumatic subarachnoid hemorrhage with loss of consciousness of unspecified duration, subsequent encounter: Secondary | ICD-10-CM | POA: Diagnosis not present

## 2017-02-11 DIAGNOSIS — I69331 Monoplegia of upper limb following cerebral infarction affecting right dominant side: Secondary | ICD-10-CM | POA: Diagnosis not present

## 2017-02-11 DIAGNOSIS — S0181XD Laceration without foreign body of other part of head, subsequent encounter: Secondary | ICD-10-CM | POA: Diagnosis not present

## 2017-02-11 DIAGNOSIS — E1122 Type 2 diabetes mellitus with diabetic chronic kidney disease: Secondary | ICD-10-CM | POA: Diagnosis not present

## 2017-02-11 DIAGNOSIS — I2510 Atherosclerotic heart disease of native coronary artery without angina pectoris: Secondary | ICD-10-CM | POA: Diagnosis not present

## 2017-02-11 DIAGNOSIS — I129 Hypertensive chronic kidney disease with stage 1 through stage 4 chronic kidney disease, or unspecified chronic kidney disease: Secondary | ICD-10-CM | POA: Diagnosis not present

## 2017-02-12 ENCOUNTER — Encounter (HOSPITAL_COMMUNITY)
Admission: RE | Admit: 2017-02-12 | Discharge: 2017-02-12 | Disposition: A | Discharge status: Home / Self Care | Payer: Medicare Other | Source: Ambulatory Visit | Attending: Nephrology | Admitting: Nephrology

## 2017-02-12 DIAGNOSIS — D631 Anemia in chronic kidney disease: Secondary | ICD-10-CM | POA: Insufficient documentation

## 2017-02-12 DIAGNOSIS — N185 Chronic kidney disease, stage 5: Secondary | ICD-10-CM

## 2017-02-12 DIAGNOSIS — N184 Chronic kidney disease, stage 4 (severe): Secondary | ICD-10-CM | POA: Insufficient documentation

## 2017-02-12 LAB — IRON AND TIBC
Iron: 71 ug/dL (ref 28–170)
SATURATION RATIOS: 25 % (ref 10.4–31.8)
TIBC: 280 ug/dL (ref 250–450)
UIBC: 209 ug/dL

## 2017-02-12 LAB — POCT HEMOGLOBIN-HEMACUE: HEMOGLOBIN: 10.2 g/dL — AB (ref 12.0–15.0)

## 2017-02-12 LAB — FERRITIN: Ferritin: 385 ng/mL — ABNORMAL HIGH (ref 11–307)

## 2017-02-12 MED ORDER — EPOETIN ALFA 20000 UNIT/ML IJ SOLN
20000.0000 [IU] | INTRAMUSCULAR | Status: DC
  Administered 2017-02-12: 20000 [IU] via SUBCUTANEOUS

## 2017-02-12 MED ORDER — EPOETIN ALFA 20000 UNIT/ML IJ SOLN
INTRAMUSCULAR | Status: AC
  Administered 2017-02-12: 13:00:00 20000 [IU] via SUBCUTANEOUS
  Filled 2017-02-12: qty 1

## 2017-02-14 ENCOUNTER — Telehealth: Payer: Self-pay

## 2017-02-14 DIAGNOSIS — I129 Hypertensive chronic kidney disease with stage 1 through stage 4 chronic kidney disease, or unspecified chronic kidney disease: Secondary | ICD-10-CM | POA: Diagnosis not present

## 2017-02-14 DIAGNOSIS — I2510 Atherosclerotic heart disease of native coronary artery without angina pectoris: Secondary | ICD-10-CM | POA: Diagnosis not present

## 2017-02-14 DIAGNOSIS — S066X9D Traumatic subarachnoid hemorrhage with loss of consciousness of unspecified duration, subsequent encounter: Secondary | ICD-10-CM | POA: Diagnosis not present

## 2017-02-14 DIAGNOSIS — I69331 Monoplegia of upper limb following cerebral infarction affecting right dominant side: Secondary | ICD-10-CM | POA: Diagnosis not present

## 2017-02-14 DIAGNOSIS — S0181XD Laceration without foreign body of other part of head, subsequent encounter: Secondary | ICD-10-CM | POA: Diagnosis not present

## 2017-02-14 DIAGNOSIS — E1122 Type 2 diabetes mellitus with diabetic chronic kidney disease: Secondary | ICD-10-CM | POA: Diagnosis not present

## 2017-02-14 NOTE — Telephone Encounter (Signed)
Rn call patient that the CT head done showed small blood absorbed. Please resume 325 aspirin. Try to avoid falls. Pt verbalized understanding.

## 2017-02-14 NOTE — Telephone Encounter (Signed)
-----   Message from Rosalin Hawking, MD sent at 02/13/2017 12:50 PM EDT ----- Could you please let the patient know that the CT head test done recently showed small blood all absorbed. She can resume her ASA 325mg  daily now. Continue to make effort to avoid fall. Thanks.  Rosalin Hawking, MD PhD Stroke Neurology 02/13/2017 12:50 PM

## 2017-02-17 DIAGNOSIS — S066X9D Traumatic subarachnoid hemorrhage with loss of consciousness of unspecified duration, subsequent encounter: Secondary | ICD-10-CM | POA: Diagnosis not present

## 2017-02-17 DIAGNOSIS — I129 Hypertensive chronic kidney disease with stage 1 through stage 4 chronic kidney disease, or unspecified chronic kidney disease: Secondary | ICD-10-CM | POA: Diagnosis not present

## 2017-02-17 DIAGNOSIS — E1122 Type 2 diabetes mellitus with diabetic chronic kidney disease: Secondary | ICD-10-CM | POA: Diagnosis not present

## 2017-02-17 DIAGNOSIS — I2510 Atherosclerotic heart disease of native coronary artery without angina pectoris: Secondary | ICD-10-CM | POA: Diagnosis not present

## 2017-02-17 DIAGNOSIS — S0181XD Laceration without foreign body of other part of head, subsequent encounter: Secondary | ICD-10-CM | POA: Diagnosis not present

## 2017-02-17 DIAGNOSIS — I69331 Monoplegia of upper limb following cerebral infarction affecting right dominant side: Secondary | ICD-10-CM | POA: Diagnosis not present

## 2017-02-18 DIAGNOSIS — I2510 Atherosclerotic heart disease of native coronary artery without angina pectoris: Secondary | ICD-10-CM | POA: Diagnosis not present

## 2017-02-18 DIAGNOSIS — S0181XD Laceration without foreign body of other part of head, subsequent encounter: Secondary | ICD-10-CM | POA: Diagnosis not present

## 2017-02-18 DIAGNOSIS — S066X9D Traumatic subarachnoid hemorrhage with loss of consciousness of unspecified duration, subsequent encounter: Secondary | ICD-10-CM | POA: Diagnosis not present

## 2017-02-18 DIAGNOSIS — I129 Hypertensive chronic kidney disease with stage 1 through stage 4 chronic kidney disease, or unspecified chronic kidney disease: Secondary | ICD-10-CM | POA: Diagnosis not present

## 2017-02-18 DIAGNOSIS — E1122 Type 2 diabetes mellitus with diabetic chronic kidney disease: Secondary | ICD-10-CM | POA: Diagnosis not present

## 2017-02-18 DIAGNOSIS — I69331 Monoplegia of upper limb following cerebral infarction affecting right dominant side: Secondary | ICD-10-CM | POA: Diagnosis not present

## 2017-02-19 DIAGNOSIS — I129 Hypertensive chronic kidney disease with stage 1 through stage 4 chronic kidney disease, or unspecified chronic kidney disease: Secondary | ICD-10-CM | POA: Diagnosis not present

## 2017-02-19 DIAGNOSIS — S0181XD Laceration without foreign body of other part of head, subsequent encounter: Secondary | ICD-10-CM | POA: Diagnosis not present

## 2017-02-19 DIAGNOSIS — S066X9D Traumatic subarachnoid hemorrhage with loss of consciousness of unspecified duration, subsequent encounter: Secondary | ICD-10-CM | POA: Diagnosis not present

## 2017-02-19 DIAGNOSIS — I69331 Monoplegia of upper limb following cerebral infarction affecting right dominant side: Secondary | ICD-10-CM | POA: Diagnosis not present

## 2017-02-19 DIAGNOSIS — I2510 Atherosclerotic heart disease of native coronary artery without angina pectoris: Secondary | ICD-10-CM | POA: Diagnosis not present

## 2017-02-19 DIAGNOSIS — E1122 Type 2 diabetes mellitus with diabetic chronic kidney disease: Secondary | ICD-10-CM | POA: Diagnosis not present

## 2017-02-20 DIAGNOSIS — E1122 Type 2 diabetes mellitus with diabetic chronic kidney disease: Secondary | ICD-10-CM | POA: Diagnosis not present

## 2017-02-20 DIAGNOSIS — I69331 Monoplegia of upper limb following cerebral infarction affecting right dominant side: Secondary | ICD-10-CM | POA: Diagnosis not present

## 2017-02-20 DIAGNOSIS — S0181XD Laceration without foreign body of other part of head, subsequent encounter: Secondary | ICD-10-CM | POA: Diagnosis not present

## 2017-02-20 DIAGNOSIS — I2510 Atherosclerotic heart disease of native coronary artery without angina pectoris: Secondary | ICD-10-CM | POA: Diagnosis not present

## 2017-02-20 DIAGNOSIS — S066X9D Traumatic subarachnoid hemorrhage with loss of consciousness of unspecified duration, subsequent encounter: Secondary | ICD-10-CM | POA: Diagnosis not present

## 2017-02-20 DIAGNOSIS — I129 Hypertensive chronic kidney disease with stage 1 through stage 4 chronic kidney disease, or unspecified chronic kidney disease: Secondary | ICD-10-CM | POA: Diagnosis not present

## 2017-02-25 ENCOUNTER — Encounter: Payer: Self-pay | Admitting: Vascular Surgery

## 2017-02-25 DIAGNOSIS — S066X9D Traumatic subarachnoid hemorrhage with loss of consciousness of unspecified duration, subsequent encounter: Secondary | ICD-10-CM | POA: Diagnosis not present

## 2017-02-25 DIAGNOSIS — S0181XD Laceration without foreign body of other part of head, subsequent encounter: Secondary | ICD-10-CM | POA: Diagnosis not present

## 2017-02-25 DIAGNOSIS — E1122 Type 2 diabetes mellitus with diabetic chronic kidney disease: Secondary | ICD-10-CM | POA: Diagnosis not present

## 2017-02-25 DIAGNOSIS — I69331 Monoplegia of upper limb following cerebral infarction affecting right dominant side: Secondary | ICD-10-CM | POA: Diagnosis not present

## 2017-02-25 DIAGNOSIS — I2510 Atherosclerotic heart disease of native coronary artery without angina pectoris: Secondary | ICD-10-CM | POA: Diagnosis not present

## 2017-02-25 DIAGNOSIS — I129 Hypertensive chronic kidney disease with stage 1 through stage 4 chronic kidney disease, or unspecified chronic kidney disease: Secondary | ICD-10-CM | POA: Diagnosis not present

## 2017-02-27 DIAGNOSIS — I2510 Atherosclerotic heart disease of native coronary artery without angina pectoris: Secondary | ICD-10-CM | POA: Diagnosis not present

## 2017-02-27 DIAGNOSIS — I129 Hypertensive chronic kidney disease with stage 1 through stage 4 chronic kidney disease, or unspecified chronic kidney disease: Secondary | ICD-10-CM | POA: Diagnosis not present

## 2017-02-27 DIAGNOSIS — S0181XD Laceration without foreign body of other part of head, subsequent encounter: Secondary | ICD-10-CM | POA: Diagnosis not present

## 2017-02-27 DIAGNOSIS — S066X9D Traumatic subarachnoid hemorrhage with loss of consciousness of unspecified duration, subsequent encounter: Secondary | ICD-10-CM | POA: Diagnosis not present

## 2017-02-27 DIAGNOSIS — I69331 Monoplegia of upper limb following cerebral infarction affecting right dominant side: Secondary | ICD-10-CM | POA: Diagnosis not present

## 2017-02-27 DIAGNOSIS — E1122 Type 2 diabetes mellitus with diabetic chronic kidney disease: Secondary | ICD-10-CM | POA: Diagnosis not present

## 2017-02-28 DIAGNOSIS — S0181XD Laceration without foreign body of other part of head, subsequent encounter: Secondary | ICD-10-CM | POA: Diagnosis not present

## 2017-02-28 DIAGNOSIS — E1122 Type 2 diabetes mellitus with diabetic chronic kidney disease: Secondary | ICD-10-CM | POA: Diagnosis not present

## 2017-02-28 DIAGNOSIS — I129 Hypertensive chronic kidney disease with stage 1 through stage 4 chronic kidney disease, or unspecified chronic kidney disease: Secondary | ICD-10-CM | POA: Diagnosis not present

## 2017-02-28 DIAGNOSIS — I2510 Atherosclerotic heart disease of native coronary artery without angina pectoris: Secondary | ICD-10-CM | POA: Diagnosis not present

## 2017-02-28 DIAGNOSIS — S066X9D Traumatic subarachnoid hemorrhage with loss of consciousness of unspecified duration, subsequent encounter: Secondary | ICD-10-CM | POA: Diagnosis not present

## 2017-02-28 DIAGNOSIS — I69331 Monoplegia of upper limb following cerebral infarction affecting right dominant side: Secondary | ICD-10-CM | POA: Diagnosis not present

## 2017-03-03 ENCOUNTER — Institutional Professional Consult (permissible substitution): Payer: Medicare Other | Admitting: Neurology

## 2017-03-05 ENCOUNTER — Encounter (HOSPITAL_COMMUNITY)
Admission: RE | Admit: 2017-03-05 | Discharge: 2017-03-05 | Disposition: A | Discharge status: Home / Self Care | Payer: Medicare Other | Source: Ambulatory Visit | Attending: Nephrology | Admitting: Nephrology

## 2017-03-05 DIAGNOSIS — N185 Chronic kidney disease, stage 5: Secondary | ICD-10-CM

## 2017-03-05 DIAGNOSIS — E1122 Type 2 diabetes mellitus with diabetic chronic kidney disease: Secondary | ICD-10-CM | POA: Diagnosis not present

## 2017-03-05 DIAGNOSIS — S0181XD Laceration without foreign body of other part of head, subsequent encounter: Secondary | ICD-10-CM | POA: Diagnosis not present

## 2017-03-05 DIAGNOSIS — D631 Anemia in chronic kidney disease: Secondary | ICD-10-CM | POA: Diagnosis not present

## 2017-03-05 DIAGNOSIS — N184 Chronic kidney disease, stage 4 (severe): Secondary | ICD-10-CM | POA: Diagnosis not present

## 2017-03-05 DIAGNOSIS — I2510 Atherosclerotic heart disease of native coronary artery without angina pectoris: Secondary | ICD-10-CM | POA: Diagnosis not present

## 2017-03-05 DIAGNOSIS — I129 Hypertensive chronic kidney disease with stage 1 through stage 4 chronic kidney disease, or unspecified chronic kidney disease: Secondary | ICD-10-CM | POA: Diagnosis not present

## 2017-03-05 DIAGNOSIS — I69331 Monoplegia of upper limb following cerebral infarction affecting right dominant side: Secondary | ICD-10-CM | POA: Diagnosis not present

## 2017-03-05 DIAGNOSIS — S066X9D Traumatic subarachnoid hemorrhage with loss of consciousness of unspecified duration, subsequent encounter: Secondary | ICD-10-CM | POA: Diagnosis not present

## 2017-03-05 LAB — POCT HEMOGLOBIN-HEMACUE: HEMOGLOBIN: 10.2 g/dL — AB (ref 12.0–15.0)

## 2017-03-05 MED ORDER — EPOETIN ALFA 20000 UNIT/ML IJ SOLN
INTRAMUSCULAR | Status: AC
Start: 2017-03-05 — End: 2017-03-06
  Filled 2017-03-05: qty 1

## 2017-03-05 MED ORDER — EPOETIN ALFA 20000 UNIT/ML IJ SOLN
20000.0000 [IU] | INTRAMUSCULAR | Status: DC
  Administered 2017-03-05: 12:00:00 20000 [IU] via SUBCUTANEOUS

## 2017-03-06 ENCOUNTER — Encounter: Payer: Self-pay | Admitting: Vascular Surgery

## 2017-03-06 ENCOUNTER — Ambulatory Visit (HOSPITAL_COMMUNITY)
Admission: RE | Admit: 2017-03-06 | Discharge: 2017-03-06 | Disposition: A | Discharge status: Home / Self Care | Payer: Medicare Other | Source: Ambulatory Visit | Attending: Vascular Surgery | Admitting: Vascular Surgery

## 2017-03-06 ENCOUNTER — Ambulatory Visit (INDEPENDENT_AMBULATORY_CARE_PROVIDER_SITE_OTHER): Payer: Medicare Other | Admitting: Vascular Surgery

## 2017-03-06 VITALS — BP 158/70 | HR 60 | Temp 98.5°F | Resp 20 | Ht 64.0 in | Wt 185.7 lb

## 2017-03-06 DIAGNOSIS — I6523 Occlusion and stenosis of bilateral carotid arteries: Secondary | ICD-10-CM | POA: Diagnosis not present

## 2017-03-06 LAB — VAS US CAROTID
LCCADSYS: 72 cm/s
LCCAPDIAS: 20 cm/s
LCCAPSYS: 97 cm/s
LEFT ECA DIAS: -3 cm/s
LEFT VERTEBRAL DIAS: 20 cm/s
LICADSYS: -89 cm/s
Left CCA dist dias: 12 cm/s
Left ICA dist dias: -22 cm/s
Left ICA prox dias: 21 cm/s
Left ICA prox sys: 73 cm/s
RCCADSYS: -67 cm/s
RCCAPDIAS: 8 cm/s
RCCAPSYS: 80 cm/s
RIGHT CCA MID DIAS: 9 cm/s
RIGHT ECA DIAS: -2 cm/s

## 2017-03-06 NOTE — Progress Notes (Signed)
Patient name: [name] MRN: [id number] DOB: 03/11/1941 Sex: female  REASON FOR VISIT:    Follow up of bilateral carotid disease.  HPI:   [name] is a pleasant 76 y.o. female who underwent a left carotid endarterectomy in 2009, and a right carotid endarterectomy in 2016. She comes in for yearly follow up visit.  Since I saw her last, she fell recently going to an [employer] and hit her head. She was in [place], and required stitches just above her left eye. A CT scan apparently showed some intracranial blood and she had to be flown by helicopter elsewhere. However follow up CT scan shows that this has all resolved.  She denies any history of stroke, TIAs, expressive or receptive aphasia, or amaurosis fugax. She denies any problems with palpitations. She has had 2 episodes where she felt slightly dizzy.  Past Medical History:  Diagnosis Date  . Anemia   . Arthritis   . Atrial fibrillation ([place])   . Blood transfusion   . CAD (coronary artery disease)   . Cancer ([place])    .  top of head- melonoma  . Carotid artery occlusion    Carotid Endartectom,y - left 2009.  Blockage Right being watched by Dr Scot Dock.  . Carotid stenosis   . 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 Complication of anesthesia    pt. states that she was difficult to wake  . Depression   . Diabetes mellitus without complication ([place])   . Dysrhythmia   . General weakness 12/2015  . GERD (gastroesophageal reflux disease)   . Glaucoma   . History of hiatal hernia   . History of kidney stones    passed  . History of pneumonia   . HOH (hard of hearing)   . Hypertension   . Hypokalemia 12/2015  . Myocardial infarction ([place])   . 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 Pneumonia   . Shortness of breath   . Stroke ([place])    hx of TIA    Family History  Problem Relation Age of Onset  . Diabetes Mother   . Hypertension Mother   . Heart disease Mother        beofre age  32  . Heart attack Mother   . Stroke Mother   . Cancer Father   . Hyperlipidemia Father   . Hypertension Father   . Deep vein thrombosis Daughter   . Diabetes Daughter   . Hyperlipidemia Daughter   . Hypertension Daughter   . Heart disease Daughter   . Peripheral vascular disease Daughter     SOCIAL HISTORY:  She is not a smoker. Social History  Substance Use Topics  . Smoking status: Never Smoker  . Smokeless tobacco: Never Used  . Alcohol use No    Allergies  Allergen Reactions  . Gabapentin Other (See Comments)    hallucinations  . Other     Other reaction(s): Fatigue, Gout, Hallucintions  . Sulfa Antibiotics     Other reaction(s): psychiatric disturbances  . Amlodipine Other (See Comments)    edema    Current Outpatient Prescriptions  Medication Sig Dispense Refill  . acetaminophen (TYLENOL) 325 MG tablet Take 2 tablets (650 mg total) by mouth every 6 (six) hours as needed for mild pain.    Marland Kitchen allopurinol (ZYLOPRIM) 100 MG tablet Take 100 mg by mouth 2 (two) times daily. Reported on 12/28/2015    .  aspirin 325 MG tablet Take 325 mg by mouth daily.    Marland Kitchen atorvastatin (LIPITOR) 10 MG tablet TAKE ONE TABLET BY MOUTH ONE TIME DAILY (Patient taking differently: Take 10 mg by mouth daily. ) 30 tablet 0  . B Complex Vitamins (VITAMIN B COMPLEX PO) Take 1 tablet by mouth daily.    . brimonidine (ALPHAGAN P) 0.1 % SOLN Place 1 drop into the left eye 2 (two) times daily.    . calcitRIOL (ROCALTROL) 0.25 MCG capsule Take 0.25 mcg by mouth daily.     . Cholecalciferol (VITAMIN D) 2000 UNITS tablet Take 2,000 Units by mouth 2 (two) times daily.    . citalopram (CELEXA) 20 MG tablet Take 20 mg by mouth daily.    . colchicine 0.6 MG tablet Take 0.6 mg by mouth daily as needed. Reported on 12/28/2015    . Cyanocobalamin (VITAMIN B 12 PO) Take 1,000 mg by mouth daily.     . fish oil-omega-3 fatty acids 1000 MG capsule Take 2 g by mouth daily.    . furosemide (LASIX) 80 MG tablet Take 1  tablet (80 mg total) by mouth daily. 30 tablet 1  . insulin glargine (LANTUS) 100 UNIT/ML injection Inject 0.2-0.3 mLs (20-30 Units total) into the skin at bedtime. (Patient taking differently: Inject 30 Units into the skin at bedtime. ) 10 mL 11  . isosorbide mononitrate (IMDUR) 30 MG 24 hr tablet Take 1 tablet (30 mg total) by mouth daily. 30 tablet 0  . Menthol-Methyl Salicylate (MUSCLE RUB) 10-15 % CREA Apply 1 application topically 2 (two) times daily.  0  . nitroGLYCERIN (NITROSTAT) 0.4 MG SL tablet Place 0.4 mg under the tongue every 5 (five) minutes as needed. Chest pain    . omeprazole (PRILOSEC) 20 MG capsule Take 20 mg by mouth daily as needed. Acid reflux    . prazosin (MINIPRESS) 1 MG capsule TAKE 1 CAPSULE (1 MG TOTAL) BY MOUTH AT BEDTIME. 30 capsule 1  . sitaGLIPtin (JANUVIA) 25 MG tablet Take 25 mg by mouth daily. Reported on 12/28/2015    . timolol (TIMOPTIC) 0.5 % ophthalmic solution Place 1 drop into the left eye daily.    . travoprost, benzalkonium, (TRAVATAN) 0.004 % ophthalmic solution Place 1 drop into the left eye at bedtime.    . metoprolol tartrate (LOPRESSOR) 25 MG tablet Take 1 tablet (25 mg total) by mouth 2 (two) times daily. 180 tablet 3   No current facility-administered medications for this visit.     REVIEW OF SYSTEMS:  [X]  denotes positive finding, [ ]  denotes negative finding Cardiac  Comments:  Chest pain or chest pressure:    Shortness of breath upon exertion:    Short of breath when lying flat:    Irregular heart rhythm:        Vascular    Pain in calf, thigh, or hip brought on by ambulation:    Pain in feet at night that wakes you up from your sleep:     Blood clot in your veins:    Leg swelling:         Pulmonary    Oxygen at home:    Productive cough:     Wheezing:         Neurologic    Sudden weakness in arms or legs:     Sudden numbness in arms or legs:     Sudden onset of difficulty speaking or slurred speech:    Temporary loss of  vision in one  eye:     Problems with dizziness:  X       Gastrointestinal    Blood in stool:     Vomited blood:         Genitourinary    Burning when urinating:     Blood in urine:        Psychiatric    Major depression:         Hematologic    Bleeding problems:    Problems with blood clotting too easily:        Skin    Rashes or ulcers:        Constitutional    Fever or chills:     PHYSICAL EXAM:   Vitals:   03/06/17 1349  BP: (!) 158/70  Pulse: 60  Resp: 20  Temp: 98.5 F (36.9 C)  TempSrc: Oral  SpO2: 97%  Weight: 185 lb 11.2 oz (84.2 kg)  Height: 5\' 4"  (1.626 m)    GENERAL: The patient is a well-nourished female, in no acute distress. The vital signs are documented above. CARDIAC: There is a regular rate and rhythm.  VASCULAR: I do not detect carotid bruits. Both feet are warm and well-perfused. She has mild bilateral lower extremity swelling. PULMONARY: There is good air exchange bilaterally without wheezing or rales. ABDOMEN: Soft and non-tender with normal pitched bowel sounds.  MUSCULOSKELETAL: There are no major deformities or cyanosis. NEUROLOGIC: No focal weakness or paresthesias are detected. SKIN: There are no ulcers or rashes noted. PSYCHIATRIC: The patient has a normal affect.  DATA:    CAROTID DUPLEX: I have independently interpreted her carotid duplex scan today.  On the right side the carotid is widely patent without evidence of restenosis.  On the left side, the carotid artery is widely patent without evidence of restenosis. There are mildly elevated velocities in the mid internal carotid artery but this is felt to be due to tortuosity.  MEDICAL ISSUES:   STATUS POST BILATERAL CAROTID ENDARTERECTOMIES: Both carotid endarterectomy sites are widely patent without evidence of restenosis. I ordered a follow up carotid duplex scan in 1 year and I'll see her back at that time. She is on aspirin and is on a statin.  With respect to her 2  episodes of dizziness, I do not think she has any evidence of vertebral basilar insufficiency. Both vertebral arteries are patent with antegrade flow. She is following up with her primary care physician Dr. Inda Merlin in early September. I will plan on seeing her back in one year with follow up carotid duplex scan. She knows to call sooner if she has problems.  She is on aspirin and is on a statin.  [name] [facility] [phone]

## 2017-03-07 ENCOUNTER — Encounter: Payer: Self-pay | Admitting: Cardiology

## 2017-03-07 ENCOUNTER — Other Ambulatory Visit: Payer: Self-pay | Admitting: Internal Medicine

## 2017-03-07 DIAGNOSIS — Z1231 Encounter for screening mammogram for malignant neoplasm of breast: Secondary | ICD-10-CM

## 2017-03-10 ENCOUNTER — Encounter: Payer: Self-pay | Admitting: Cardiology

## 2017-03-13 DIAGNOSIS — I129 Hypertensive chronic kidney disease with stage 1 through stage 4 chronic kidney disease, or unspecified chronic kidney disease: Secondary | ICD-10-CM | POA: Diagnosis not present

## 2017-03-13 DIAGNOSIS — D631 Anemia in chronic kidney disease: Secondary | ICD-10-CM | POA: Diagnosis not present

## 2017-03-13 DIAGNOSIS — E669 Obesity, unspecified: Secondary | ICD-10-CM | POA: Diagnosis not present

## 2017-03-13 DIAGNOSIS — N2581 Secondary hyperparathyroidism of renal origin: Secondary | ICD-10-CM | POA: Diagnosis not present

## 2017-03-13 DIAGNOSIS — N184 Chronic kidney disease, stage 4 (severe): Secondary | ICD-10-CM | POA: Diagnosis not present

## 2017-03-13 DIAGNOSIS — Z6832 Body mass index (BMI) 32.0-32.9, adult: Secondary | ICD-10-CM | POA: Diagnosis not present

## 2017-03-18 NOTE — Addendum Note (Signed)
Addended by: Lianne Cure A on: 03/18/2017 12:58 PM   Modules accepted: Orders

## 2017-03-19 ENCOUNTER — Other Ambulatory Visit: Payer: Self-pay

## 2017-03-19 DIAGNOSIS — T82510A Breakdown (mechanical) of surgically created arteriovenous fistula, initial encounter: Secondary | ICD-10-CM

## 2017-03-20 DIAGNOSIS — M109 Gout, unspecified: Secondary | ICD-10-CM | POA: Diagnosis not present

## 2017-03-20 DIAGNOSIS — M545 Low back pain: Secondary | ICD-10-CM | POA: Diagnosis not present

## 2017-03-20 DIAGNOSIS — I10 Essential (primary) hypertension: Secondary | ICD-10-CM | POA: Diagnosis not present

## 2017-03-20 DIAGNOSIS — D631 Anemia in chronic kidney disease: Secondary | ICD-10-CM | POA: Diagnosis not present

## 2017-03-20 DIAGNOSIS — R809 Proteinuria, unspecified: Secondary | ICD-10-CM | POA: Diagnosis not present

## 2017-03-20 DIAGNOSIS — I25111 Atherosclerotic heart disease of native coronary artery with angina pectoris with documented spasm: Secondary | ICD-10-CM | POA: Diagnosis not present

## 2017-03-20 DIAGNOSIS — E1121 Type 2 diabetes mellitus with diabetic nephropathy: Secondary | ICD-10-CM | POA: Diagnosis not present

## 2017-03-20 DIAGNOSIS — I609 Nontraumatic subarachnoid hemorrhage, unspecified: Secondary | ICD-10-CM | POA: Diagnosis not present

## 2017-03-20 DIAGNOSIS — Z0001 Encounter for general adult medical examination with abnormal findings: Secondary | ICD-10-CM | POA: Diagnosis not present

## 2017-03-20 DIAGNOSIS — E11319 Type 2 diabetes mellitus with unspecified diabetic retinopathy without macular edema: Secondary | ICD-10-CM | POA: Diagnosis not present

## 2017-03-20 DIAGNOSIS — E782 Mixed hyperlipidemia: Secondary | ICD-10-CM | POA: Diagnosis not present

## 2017-03-20 DIAGNOSIS — Z1389 Encounter for screening for other disorder: Secondary | ICD-10-CM | POA: Diagnosis not present

## 2017-03-20 DIAGNOSIS — E0840 Diabetes mellitus due to underlying condition with diabetic neuropathy, unspecified: Secondary | ICD-10-CM | POA: Diagnosis not present

## 2017-03-20 DIAGNOSIS — N184 Chronic kidney disease, stage 4 (severe): Secondary | ICD-10-CM | POA: Diagnosis not present

## 2017-03-20 DIAGNOSIS — K219 Gastro-esophageal reflux disease without esophagitis: Secondary | ICD-10-CM | POA: Diagnosis not present

## 2017-03-20 DIAGNOSIS — Z23 Encounter for immunization: Secondary | ICD-10-CM | POA: Diagnosis not present

## 2017-03-20 DIAGNOSIS — E559 Vitamin D deficiency, unspecified: Secondary | ICD-10-CM | POA: Diagnosis not present

## 2017-03-20 DIAGNOSIS — D81818 Other biotin-dependent carboxylase deficiency: Secondary | ICD-10-CM | POA: Diagnosis not present

## 2017-03-26 ENCOUNTER — Encounter (HOSPITAL_COMMUNITY)
Admission: RE | Admit: 2017-03-26 | Discharge: 2017-03-26 | Disposition: A | Discharge status: Home / Self Care | Payer: Medicare Other | Source: Ambulatory Visit | Attending: Nephrology | Admitting: Nephrology

## 2017-03-26 DIAGNOSIS — N184 Chronic kidney disease, stage 4 (severe): Secondary | ICD-10-CM | POA: Diagnosis not present

## 2017-03-26 DIAGNOSIS — D631 Anemia in chronic kidney disease: Secondary | ICD-10-CM | POA: Diagnosis not present

## 2017-03-26 DIAGNOSIS — N185 Chronic kidney disease, stage 5: Secondary | ICD-10-CM

## 2017-03-26 LAB — POCT HEMOGLOBIN-HEMACUE: HEMOGLOBIN: 10.1 g/dL — AB (ref 12.0–15.0)

## 2017-03-26 MED ORDER — EPOETIN ALFA 20000 UNIT/ML IJ SOLN
20000.0000 [IU] | INTRAMUSCULAR | Status: DC
Start: 2017-03-26 — End: 2017-03-27
  Administered 2017-03-26: 20000 [IU] via SUBCUTANEOUS

## 2017-03-26 MED ORDER — EPOETIN ALFA 20000 UNIT/ML IJ SOLN
INTRAMUSCULAR | Status: AC
  Administered 2017-03-26: 20000 [IU] via SUBCUTANEOUS
  Filled 2017-03-26: qty 1

## 2017-04-07 ENCOUNTER — Ambulatory Visit
Admission: RE | Admit: 2017-04-07 | Discharge: 2017-04-07 | Disposition: A | Discharge status: Home / Self Care | Payer: Medicare Other | Source: Ambulatory Visit | Attending: Internal Medicine | Admitting: Internal Medicine

## 2017-04-07 DIAGNOSIS — Z1231 Encounter for screening mammogram for malignant neoplasm of breast: Secondary | ICD-10-CM | POA: Diagnosis not present

## 2017-04-09 ENCOUNTER — Ambulatory Visit: Payer: Medicare Other | Admitting: Vascular Surgery

## 2017-04-16 ENCOUNTER — Encounter (HOSPITAL_COMMUNITY)
Admission: RE | Admit: 2017-04-16 | Discharge: 2017-04-16 | Disposition: A | Discharge status: Home / Self Care | Payer: Medicare Other | Source: Ambulatory Visit | Attending: Nephrology | Admitting: Nephrology

## 2017-04-16 DIAGNOSIS — D631 Anemia in chronic kidney disease: Secondary | ICD-10-CM | POA: Insufficient documentation

## 2017-04-16 DIAGNOSIS — N184 Chronic kidney disease, stage 4 (severe): Secondary | ICD-10-CM | POA: Insufficient documentation

## 2017-04-16 DIAGNOSIS — N185 Chronic kidney disease, stage 5: Secondary | ICD-10-CM

## 2017-04-16 LAB — POCT HEMOGLOBIN-HEMACUE: HEMOGLOBIN: 10.3 g/dL — AB (ref 12.0–15.0)

## 2017-04-16 MED ORDER — EPOETIN ALFA 20000 UNIT/ML IJ SOLN
20000.0000 [IU] | INTRAMUSCULAR | Status: DC
  Administered 2017-04-16: 20000 [IU] via SUBCUTANEOUS

## 2017-04-16 MED ORDER — EPOETIN ALFA 20000 UNIT/ML IJ SOLN
INTRAMUSCULAR | Status: AC
  Filled 2017-04-16: qty 1

## 2017-04-17 DIAGNOSIS — H401131 Primary open-angle glaucoma, bilateral, mild stage: Secondary | ICD-10-CM | POA: Diagnosis not present

## 2017-04-17 DIAGNOSIS — H43392 Other vitreous opacities, left eye: Secondary | ICD-10-CM | POA: Diagnosis not present

## 2017-04-17 DIAGNOSIS — E119 Type 2 diabetes mellitus without complications: Secondary | ICD-10-CM | POA: Diagnosis not present

## 2017-04-17 DIAGNOSIS — Z794 Long term (current) use of insulin: Secondary | ICD-10-CM | POA: Diagnosis not present

## 2017-04-17 DIAGNOSIS — Z961 Presence of intraocular lens: Secondary | ICD-10-CM | POA: Diagnosis not present

## 2017-04-23 ENCOUNTER — Encounter: Payer: Self-pay | Admitting: Vascular Surgery

## 2017-04-23 ENCOUNTER — Ambulatory Visit (HOSPITAL_COMMUNITY)
Admission: RE | Admit: 2017-04-23 | Discharge: 2017-04-23 | Disposition: A | Discharge status: Home / Self Care | Payer: Medicare Other | Source: Ambulatory Visit | Attending: Vascular Surgery | Admitting: Vascular Surgery

## 2017-04-23 ENCOUNTER — Ambulatory Visit (INDEPENDENT_AMBULATORY_CARE_PROVIDER_SITE_OTHER): Payer: Medicare Other | Admitting: Vascular Surgery

## 2017-04-23 VITALS — BP 115/49 | HR 53 | Temp 97.0°F | Resp 20 | Ht 64.0 in | Wt 180.0 lb

## 2017-04-23 DIAGNOSIS — X58XXXA Exposure to other specified factors, initial encounter: Secondary | ICD-10-CM | POA: Insufficient documentation

## 2017-04-23 DIAGNOSIS — I6523 Occlusion and stenosis of bilateral carotid arteries: Secondary | ICD-10-CM

## 2017-04-23 DIAGNOSIS — T82510A Breakdown (mechanical) of surgically created arteriovenous fistula, initial encounter: Secondary | ICD-10-CM

## 2017-04-23 DIAGNOSIS — Z992 Dependence on renal dialysis: Secondary | ICD-10-CM | POA: Diagnosis not present

## 2017-04-23 DIAGNOSIS — N186 End stage renal disease: Secondary | ICD-10-CM

## 2017-04-23 NOTE — Progress Notes (Signed)
Patient name: [name] MRN: [id number] DOB: [birth year] Sex: female  REASON FOR VISIT:    High-pitched bruit over left upper arm access. The consult is requested by Dr. Mercy Moore.   HPI:   [name] is a pleasant 76 y.o. female who is not yet on dialysis. She had a left upper arm brachiocephalic fistula that failed and on 09/01/2014 she had a new left upper arm AV graft placed. This is not being used. She had a high-pitched bruit on exam and was sent for further evaluation of her graft. She's had no pain or paresthesias in her left arm.  She has undergone bilateral previous carotid endarterectomies and remains asymptomatic. She denies any history of stroke, TIAs, expressible receptive aphasia, or amaurosis fugax. She did have a fall this summer and hit her head and apparently had some intracranial bleeding but has recovered from this.  I reviewed the records from [facility]. The patient has chronic kidney disease secondary to diabetes and hypertension. Labs on 03/13/2017 showed a GFR of 9. Creatinine was 4.35. Her blood pressure was under good control.  Past Medical History:  Diagnosis Date  . Anemia   . Arthritis   . Atrial fibrillation ([place])   . Blood transfusion   . CAD (coronary artery disease)   . Cancer ([place])    .  top of head- melonoma  . Carotid artery occlusion    Carotid Endartectom,y - left 2009.  Blockage Right being watched by Dr Scot Dock.  . Carotid stenosis   . 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 Complication of anesthesia    pt. states that she was difficult to wake  . Depression   . Diabetes mellitus without complication ([place])   . Dysrhythmia   . General weakness 12/2015  . GERD (gastroesophageal reflux disease)   . Glaucoma   . History of hiatal hernia   . History of kidney stones    passed  . History of pneumonia   . HOH (hard of hearing)   . Hypertension   . Hypokalemia 12/2015  . Myocardial infarction ([place])     . Neuromuscular disorder ([place])    CARPEL TUNNEL  . Pneumonia   . Shortness of breath   . Stroke ([place])    hx of TIA    Family History  Problem Relation Age of Onset  . Diabetes Mother   . Hypertension Mother   . Heart disease Mother        beofre age 68  . Heart attack Mother   . Stroke Mother   . Cancer Father   . Hyperlipidemia Father   . Hypertension Father   . Deep vein thrombosis Daughter   . Diabetes Daughter   . Hyperlipidemia Daughter   . Hypertension Daughter   . Heart disease Daughter   . Peripheral vascular disease Daughter   . Breast cancer Neg Hx     SOCIAL HISTORY: Social History  Substance Use Topics  . Smoking status: Never Smoker  . Smokeless tobacco: Never Used  . Alcohol use No    Allergies  Allergen Reactions  . Gabapentin Other (See Comments)    hallucinations  . Other     Other reaction(s): Fatigue, Gout, Hallucintions  . Sulfa Antibiotics     Other reaction(s): psychiatric disturbances  . Amlodipine Other (See Comments)    edema    Current Outpatient Prescriptions  Medication Sig Dispense Refill  . acetaminophen (TYLENOL) 325 MG tablet  Take 2 tablets (650 mg total) by mouth every 6 (six) hours as needed for mild pain.    Marland Kitchen allopurinol (ZYLOPRIM) 100 MG tablet Take 100 mg by mouth 2 (two) times daily. Reported on 12/28/2015    . aspirin 325 MG tablet Take 325 mg by mouth daily.    Marland Kitchen atorvastatin (LIPITOR) 10 MG tablet TAKE ONE TABLET BY MOUTH ONE TIME DAILY (Patient taking differently: Take 10 mg by mouth daily. ) 30 tablet 0  . B Complex Vitamins (VITAMIN B COMPLEX PO) Take 1 tablet by mouth daily.    . brimonidine (ALPHAGAN P) 0.1 % SOLN Place 1 drop into the left eye 2 (two) times daily.    . calcitRIOL (ROCALTROL) 0.25 MCG capsule Take 0.25 mcg by mouth daily.     . Cholecalciferol (VITAMIN D) 2000 UNITS tablet Take 2,000 Units by mouth 2 (two) times daily.    . citalopram (CELEXA) 20 MG tablet Take 20 mg by mouth daily.    .  colchicine 0.6 MG tablet Take 0.6 mg by mouth daily as needed. Reported on 12/28/2015    . Cyanocobalamin (VITAMIN B 12 PO) Take 1,000 mg by mouth daily.     . fish oil-omega-3 fatty acids 1000 MG capsule Take 2 g by mouth daily.    . furosemide (LASIX) 80 MG tablet Take 1 tablet (80 mg total) by mouth daily. 30 tablet 1  . insulin glargine (LANTUS) 100 UNIT/ML injection Inject 0.2-0.3 mLs (20-30 Units total) into the skin at bedtime. (Patient taking differently: Inject 30 Units into the skin at bedtime. ) 10 mL 11  . isosorbide mononitrate (IMDUR) 30 MG 24 hr tablet Take 1 tablet (30 mg total) by mouth daily. 30 tablet 0  . Menthol-Methyl Salicylate (MUSCLE RUB) 10-15 % CREA Apply 1 application topically 2 (two) times daily.  0  . nitroGLYCERIN (NITROSTAT) 0.4 MG SL tablet Place 0.4 mg under the tongue every 5 (five) minutes as needed. Chest pain    . omeprazole (PRILOSEC) 20 MG capsule Take 20 mg by mouth daily as needed. Acid reflux    . prazosin (MINIPRESS) 1 MG capsule TAKE 1 CAPSULE (1 MG TOTAL) BY MOUTH AT BEDTIME. 30 capsule 1  . sitaGLIPtin (JANUVIA) 25 MG tablet Take 25 mg by mouth daily. Reported on 12/28/2015    . timolol (TIMOPTIC) 0.5 % ophthalmic solution Place 1 drop into the left eye daily.    . travoprost, benzalkonium, (TRAVATAN) 0.004 % ophthalmic solution Place 1 drop into the left eye at bedtime.    . metoprolol tartrate (LOPRESSOR) 25 MG tablet Take 1 tablet (25 mg total) by mouth 2 (two) times daily. 180 tablet 3   No current facility-administered medications for this visit.     REVIEW OF SYSTEMS:  [X]  denotes positive finding, [ ]  denotes negative finding Cardiac  Comments:  Chest pain or chest pressure:    Shortness of breath upon exertion: X   Short of breath when lying flat: X   Irregular heart rhythm:        Vascular    Pain in calf, thigh, or hip brought on by ambulation: X   Pain in feet at night that wakes you up from your sleep:     Blood clot in your  veins:    Leg swelling:         Pulmonary    Oxygen at home:    Productive cough:     Wheezing:  Neurologic    Sudden weakness in arms or legs:     Sudden numbness in arms or legs:     Sudden onset of difficulty speaking or slurred speech:    Temporary loss of vision in one eye:     Problems with dizziness:  X       Gastrointestinal    Blood in stool:     Vomited blood:         Genitourinary    Burning when urinating:     Blood in urine:        Psychiatric    Major depression:         Hematologic    Bleeding problems:    Problems with blood clotting too easily:        Skin    Rashes or ulcers:        Constitutional    Fever or chills:     PHYSICAL EXAM:   Vitals:   04/23/17 0923  BP: (!) 115/49  Pulse: (!) 53  Resp: 20  Temp: (!) 97 F (36.1 C)  TempSrc: Oral  SpO2: 97%  Weight: 180 lb (81.6 kg)  Height: 5\' 4"  (1.626 m)    GENERAL: The patient is a well-nourished female, in no acute distress. The vital signs are documented above. CARDIAC: There is a regular rate and rhythm.  VASCULAR: Her left upper arm graft is slightly pulsatile. She has a palpable left radial pulse. PULMONARY: There is good air exchange bilaterally without wheezing or rales. NEUROLOGIC: No focal weakness or paresthesias are detected. SKIN: There are no ulcers or rashes noted. PSYCHIATRIC: The patient has a normal affect.  DATA:    DUPLEX OF LEFT UPPER ARM GRAFT: Her duplex of her graft shows an area of increased velocities in the upper arm. Peak systolic velocity 831 cm/s.  MEDICAL ISSUES:   STENOSIS WITHIN LEFT UPPER ARM GRAFT: The patient has a stenosis in her left upper arm graft by duplex. Normally I would recommend a fistulogram, however she is not yet on dialysis and I am reluctant to give her contrast. The other option would be to consider surgical revision however given that the stenosis is in an unusual location, and not at the venous anastomosis, I would prefer to  have a preoperative fistulogram to fully assess the graft. She is very careful about checking her bruit every day. All things considered, rather than giving her contrast, she will check her graft every day and call if the graft occludes. She could then undergo surgical thrombectomy and revision based on the intraoperative findings. I have ordered a follow duplex can 2 months and I'll see her back at that time. She'll call sooner if she has any problems.  [name] [facility] [phone]

## 2017-04-25 NOTE — Addendum Note (Signed)
Addended by: Lianne Cure A on: 04/25/2017 10:17 AM   Modules accepted: Orders

## 2017-05-07 ENCOUNTER — Inpatient Hospital Stay (HOSPITAL_COMMUNITY)
Admission: RE | Admit: 2017-05-07 | Discharge: 2017-05-07 | Disposition: A | Discharge status: Home / Self Care | Payer: Medicare Other | Source: Ambulatory Visit | Attending: Nephrology | Admitting: Nephrology

## 2017-05-13 ENCOUNTER — Ambulatory Visit (INDEPENDENT_AMBULATORY_CARE_PROVIDER_SITE_OTHER): Payer: Medicare Other | Admitting: Neurology

## 2017-05-13 ENCOUNTER — Encounter: Payer: Self-pay | Admitting: Neurology

## 2017-05-13 VITALS — BP 129/48 | HR 49 | Wt 183.0 lb

## 2017-05-13 DIAGNOSIS — E785 Hyperlipidemia, unspecified: Secondary | ICD-10-CM

## 2017-05-13 DIAGNOSIS — W19XXXD Unspecified fall, subsequent encounter: Secondary | ICD-10-CM

## 2017-05-13 DIAGNOSIS — F410 Panic disorder [episodic paroxysmal anxiety] without agoraphobia: Secondary | ICD-10-CM

## 2017-05-13 DIAGNOSIS — I6523 Occlusion and stenosis of bilateral carotid arteries: Secondary | ICD-10-CM | POA: Diagnosis not present

## 2017-05-13 DIAGNOSIS — I609 Nontraumatic subarachnoid hemorrhage, unspecified: Secondary | ICD-10-CM

## 2017-05-13 DIAGNOSIS — F419 Anxiety disorder, unspecified: Secondary | ICD-10-CM

## 2017-05-13 DIAGNOSIS — I10 Essential (primary) hypertension: Secondary | ICD-10-CM | POA: Diagnosis not present

## 2017-05-13 DIAGNOSIS — E119 Type 2 diabetes mellitus without complications: Secondary | ICD-10-CM | POA: Diagnosis not present

## 2017-05-13 NOTE — Patient Instructions (Signed)
-   continue ASA and lipitor for stroke prevention. - check BP and glucose at home and record. - follow up with Cardiology regularly. - Follow up with your primary care physician for stroke risk factor modification. Recommend maintain blood pressure goal <130/80, diabetes with hemoglobin A1c goal below 6.5% and lipids with LDL cholesterol goal below 70 mg/dL.  - will refer to psychologist for psychotherapy with anxiety, panic attack - refer to home PT/OT for gait and balance training - relaxation and build up confidence.  - follow up in 2 months.

## 2017-05-13 NOTE — Progress Notes (Signed)
STROKE NEUROLOGY FOLLOW UP NOTE  NAME: DARLENA KOVAL DOB: 1940/12/17  REASON FOR VISIT: stroke follow up HISTORY FROM: pt and husband and chart  Today we had the pleasure of seeing INICE SANLUIS in follow-up at our Neurology Clinic. Pt was accompanied by husband.   History Summary Ms. JERRIE SCHUSSLER is a 76 y.o. female with history of HTN, DM, CAD s/p CABG with brief afib not on anticoagulation, bilateral carotid artery stenosis and CEAs, and TIA was admitted on 07/13/15 for abnormal speech output and confusion. She did not receive IV t-PA due to minimal deficits.MRI negative for stroke. MRA no large vessel occlusion. CUS, TTE, EEG x 2 all unremarkable. LDL 81 and A1C 7.0. Her symptoms were not considered as TIA at that time but more likely encephalopathy and acute delirium due to AKI vs. Possible bacteremia vs. Possible seizure with prolonged post ictal. She was treated with vanco with trending down WBC and afebrile. Her AKI also much improved. Her symptoms gradually improved over time and more awake and alert and orientated. She was then discharged with continued ASA and lipitor. Of note, she did have brief afib s/p CABG procedure but there is no further evidence of further afib as per her cardiologist Dr. Irish Lack, so anticoagulation was not considered on discharge.   09/21/15 follow up - the patient has been doing well. No recurrent AMS or seizure or stroke. Check BP at home was good, today BP 126/59. Glucose at home around 170s, not compliant with diabetic diet. Has follow up with Dr. Irish Lack and had 30 day cardiac event monitoring and no afib found. Continued on ASA and lipitor.    02/05/16 follow up - pt had LB surgery in 11/2015. She stayed in rehab for 2 weeks before back home. Currently finished home PT/OT, but she still complains of left hip and back pain on walking. She came in today with wheelchair, but she is able to walk short distance with walker at home. Her BP high today 170/60  and she attributed it to bacon she ate recently. Her glucose in better control, today at home 88. She is going to see Dr. Inda Merlin next month. So far husband declined outpt PT/OT and they would like to discuss with Dr. Inda Merlin.  02/03/17 follow up - patient has been doing well until 01/25/2017 when she had a fall in an antique store. Apparently, it was a mechanical fall when she made a turn and falling on her left. She was using cane at the time She struck her left forehead to the ground, needed sutures at left frontal above eyebrow. Head CT showed bilateral small frontal SAH, denies LOC, headache, or focal neurological deficit. Neurosurgery consulted, aspirin on hold, discharge with possible resume aspirin 2 weeks. Since discharge, she has been doing well. No HA or cognitive impairment. She will go to [place] with family for vacation. BP 135/57. Stated her BP at home controlled well. Since the incident, she is walking with walker.   Interval History During the interval time, pt has developed walking difficulties. She tends to avoid walking as much as she can. When she is walking, she is hold on to walker tightly, looking down with cautious gait, slow walking. There are many episodes for the last 3 months that she either on standing up or during walking started to have feeling of falling forward and feeling of collapse. She will scream out but she will also holding the walker so tightly and her body  so rigid and she even can not sit down in chair. No fall, LOC, difficulty speech, or weakness or numbness.   As per family, her BP and glucose in good control at home. She had repeat CT in 01/2017 showed SAH all resolved and her ASA was resumed. Today BP 129/48. She still has hearing difficulty on hearing aid.   REVIEW OF SYSTEMS: Full 14 system review of systems performed and notable only for those listed below and in HPI above, all others are negative:  Constitutional:  fatigue Cardiovascular:    Ear/Nose/Throat:  Hearing loss, ringing in ears Skin:  Eyes: Light sensitivity Respiratory:  Gastroitestinal: Vomiting, diarrhea Genitourinary:  Hematology/Lymphatic:   Endocrine: feeling cold Musculoskeletal:  back pain, walking difficulties, neck pain Allergy/Immunology:   Neurological:  Weakness, tremor, dizziness Psychiatric: Depression, nervous, anxiety Sleep: Daytime sleepiness, snoring, restless leg  The following represents the patient's updated allergies and side effects list: Allergies  Allergen Reactions  . Gabapentin Other (See Comments)    hallucinations  . Other     Other reaction(s): Fatigue, Gout, Hallucintions  . Sulfa Antibiotics     Other reaction(s): psychiatric disturbances  . Amlodipine Other (See Comments)    edema    The neurologically relevant items on the patient's problem list were reviewed on today's visit.  Neurologic Examination  A problem focused neurological exam (12 or more points of the single system neurologic examination, vital signs counts as 1 point, cranial nerves count for 8 points) was performed.  Blood pressure (!) 129/48, pulse (!) 49, weight 183 lb (83 kg).  General - Well nourished, well developed, in no apparent distress.   Ophthalmologic - Fundi not visualized due to small pupils.  Cardiovascular - Regular rate and rhythm with no murmur.  Mental Status -  Level of arousal and orientation to time, place, and person were intact. Language including expression, naming, repetition, comprehension was assessed and found intact. Fund of Knowledge was assessed and was intact.  Cranial Nerves II - XII - II - Visual field intact OU. III, IV, VI - Extraocular movements intact. V - Facial sensation intact bilaterally. VII - Facial movement intact bilaterally. VIII - Hard of hearing & vestibular intact bilaterally. X - Palate elevates symmetrically. XI - Chin turning & shoulder shrug intact bilaterally. XII - Tongue protrusion  intact.  Motor Strength - The patient's strength was normal in BUEs and 4+/5 BLEs and pronator drift was absent.  Bulk was normal and fasciculations were absent.   Motor Tone - Muscle tone was assessed at the neck and appendages and was normal.  Reflexes - The patient's reflexes were 1+ in all extremities and she had no pathological reflexes.  Sensory - Light touch, temperature/pinprick were assessed and were normal.    Coordination - The patient had normal movements in the hands and feet with no ataxia or dysmetria.  Tremor was absent.  Gait and Station - walk with walker, slow, cautious gait, small stride, eyes looking downward.   Data reviewed: I personally reviewed the images and agree with the radiology interpretations.  Ct Head Wo Contrast 07/13/2015 No acute intracranial abnormality noted.   MRI HEAD 07/13/2015 : No acute intracranial process, specifically no acute ischemia on this mildly motion degraded examination. Negative noncontrast MRI brain for age.   MRA HEAD 07/13/2015 No acute large vessel occlusion or definite high-grade stenosis on this moderately motion degraded examination.   EEG 07/15/15 - This EEG is abnormal with mild nonspecific generalized slowing of cerebral activity which  can be seen with metabolic as well as toxic and degenerative encephalopathies. No evidence of an epileptic disorder was demonstrated. Compared to previous study on 07/14/2015, there is slight improvement in background cerebral activity.  EEG 07/14/15- this is an abnormal awake and asleep EEG that demonstrated findings consistent with a mild non specific encephalopathy. No electrographic seizures seen.  CUS - Findings suggest 1-39% internal carotid artery stenosis bilaterally. The left vertebral artery is patent with antegrade flow. Unable to visualize the right vertebral artery.  TTE - Left ventricle: The cavity size was normal. Wall thickness was normal. Systolic function was  normal. The estimated ejection fraction was in the range of 55% to 60%. - Mitral valve: Calcified annulus. Mildly thickened leaflets . - Left atrium: The atrium was mildly dilated. - Atrial septum: No defect or patent foramen ovale was identified.  30 day cardia event monitoring - no afib  CT head 01/26/17 Small amount of subarachnoid hemorrhage adjacent to both frontal lobes without mass effect. No midline shift. Senescent changes. Mild sinusitis.  Component     Latest Ref Rng 07/13/2015 07/14/2015  Cholesterol     0 - 200 mg/dL 177   Triglycerides     <150 mg/dL 280 (H)   HDL Cholesterol     >40 mg/dL 40 (L)   Total CHOL/HDL Ratio      4.4   VLDL     0 - 40 mg/dL 56 (H)   LDL (calc)     0 - 99 mg/dL 81   Hemoglobin A1C     4.8 - 5.6 % 7.0 (H)   Mean Plasma Glucose      154   Ammonia     9 - 35 umol/L  14  TSH     0.350 - 4.500 uIU/mL  2.039  T4,Free(Direct)     0.61 - 1.12 ng/dL  0.77  Vitamin B12     180 - 914 pg/mL  4206 (H)  RPR     Non Reactive  Non Reactive  HIV     Non Reactive  Non Reactive  Thyroglobulin Antibody     0.0 - 0.9 IU/mL  <1.0  Thyroperoxidase Ab SerPl-aCnc     0 - 34 IU/mL  21    Assessment: As you may recall, she is a 76 y.o. Caucasian female with PMH of HTN, DM, CAD s/p CABG with brief afib not on anticoagulation, bilateral carotid artery stenosis and CEAs, and TIA was admitted on 07/13/15 for abnormal speech and confusion. MRI negative for stroke. MRA no large vessel occlusion. CUS, TTE, EEG x 2 all unremarkable. LDL 81 and A1C 7.0. Her symptoms were more likely encephalopathy and acute delirium due to AKI vs. Possible bacteremia vs. Possible seizure with prolonged post ictal. EEG negative. Her symptoms gradually improved over time and more awake and alert and orientated. She was then discharged with continued ASA and lipitor. Of note, she did have brief afib s/p CABG procedure but there is no evidence of further afib. She follow up with  Dr. Irish Lack and had 30 day cardiac event monitoring and no afib found. Had recent LB surgery and still has back and left hip pain. Finished home PT/OT. BP and glucose in good control.  Had recent mechanical fall on 01/25/17, resulting of bilateral small frontal SAH. However no focal neurological deficit. Aspirin resumed after CT head repeat showed SAH resolution.  However, developed gait disability, cautious gait with walker, anxious on walking, panic attacks with fall feeling, consistent  with PTSD after the fall in 01/2017. Will refer to psychology and home PT/OT  Plan:  - continue ASA and lipitor for stroke prevention. - check BP and glucose at home and record. - follow up with Cardiology regularly. - Follow up with your primary care physician for stroke risk factor modification. Recommend maintain blood pressure goal <130/80, diabetes with hemoglobin A1c goal below 6.5% and lipids with LDL cholesterol goal below 70 mg/dL.  - will refer to psychologist for psychotherapy with anxiety, panic attack - refer to home PT/OT for gait and balance training - relaxation and build up confidence.  - follow up in 2 months.  A total of 45 minutes was spent face-to-face with this patient. Over half this time was spent on counseling patient on the walking difficulty diagnosis and different diagnostic and therapeutic options available. I had long discussion with pt, husband and daughter regarding the possible etiology and treatment plan. They expressed understanding and appreciation.    Orders Placed This Encounter  Procedures  . Ambulatory referral to Home Health    Referral Priority:   Routine    Referral Type:   Home Health Care    Referral Reason:   Specialty Services Required    Requested Specialty:   [place]    Number of Visits Requested:   1  . Ambulatory referral to Psychology    Referral Priority:   Routine    Referral Type:   Psychiatric    Referral Reason:   Specialty Services  Required    Requested Specialty:   Psychology    Number of Visits Requested:   1    No orders of the defined types were placed in this encounter.   Patient Instructions  - continue ASA and lipitor for stroke prevention. - check BP and glucose at home and record. - follow up with Cardiology regularly. - Follow up with your primary care physician for stroke risk factor modification. Recommend maintain blood pressure goal <130/80, diabetes with hemoglobin A1c goal below 6.5% and lipids with LDL cholesterol goal below 70 mg/dL.  - will refer to psychologist for psychotherapy with anxiety, panic attack - refer to home PT/OT for gait and balance training - relaxation and build up confidence.  - follow up in 2 months.   Rosalin Hawking, MD PhD [facility] [address], [place], [place] [address] [phone]

## 2017-05-14 ENCOUNTER — Encounter (HOSPITAL_COMMUNITY)
Admission: RE | Admit: 2017-05-14 | Discharge: 2017-05-14 | Disposition: A | Discharge status: Home / Self Care | Payer: Medicare Other | Source: Ambulatory Visit | Attending: Nephrology | Admitting: Nephrology

## 2017-05-14 DIAGNOSIS — N184 Chronic kidney disease, stage 4 (severe): Secondary | ICD-10-CM | POA: Diagnosis not present

## 2017-05-14 DIAGNOSIS — R2689 Other abnormalities of gait and mobility: Secondary | ICD-10-CM | POA: Diagnosis not present

## 2017-05-14 DIAGNOSIS — D631 Anemia in chronic kidney disease: Secondary | ICD-10-CM | POA: Diagnosis not present

## 2017-05-14 DIAGNOSIS — F410 Panic disorder [episodic paroxysmal anxiety] without agoraphobia: Secondary | ICD-10-CM | POA: Diagnosis not present

## 2017-05-14 DIAGNOSIS — N185 Chronic kidney disease, stage 5: Secondary | ICD-10-CM

## 2017-05-14 LAB — POCT HEMOGLOBIN-HEMACUE: Hemoglobin: 9.3 g/dL — ABNORMAL LOW (ref 12.0–15.0)

## 2017-05-14 MED ORDER — EPOETIN ALFA 20000 UNIT/ML IJ SOLN
INTRAMUSCULAR | Status: AC
  Filled 2017-05-14: qty 1

## 2017-05-14 MED ORDER — EPOETIN ALFA 20000 UNIT/ML IJ SOLN
20000.0000 [IU] | INTRAMUSCULAR | Status: DC
  Administered 2017-05-14: 12:00:00 20000 [IU] via SUBCUTANEOUS

## 2017-05-15 DIAGNOSIS — N184 Chronic kidney disease, stage 4 (severe): Secondary | ICD-10-CM | POA: Diagnosis not present

## 2017-05-15 DIAGNOSIS — D631 Anemia in chronic kidney disease: Secondary | ICD-10-CM | POA: Diagnosis not present

## 2017-05-15 DIAGNOSIS — N2581 Secondary hyperparathyroidism of renal origin: Secondary | ICD-10-CM | POA: Diagnosis not present

## 2017-05-15 DIAGNOSIS — I129 Hypertensive chronic kidney disease with stage 1 through stage 4 chronic kidney disease, or unspecified chronic kidney disease: Secondary | ICD-10-CM | POA: Diagnosis not present

## 2017-05-20 DIAGNOSIS — R2689 Other abnormalities of gait and mobility: Secondary | ICD-10-CM | POA: Diagnosis not present

## 2017-05-20 DIAGNOSIS — F410 Panic disorder [episodic paroxysmal anxiety] without agoraphobia: Secondary | ICD-10-CM | POA: Diagnosis not present

## 2017-05-21 ENCOUNTER — Telehealth: Payer: Self-pay | Admitting: Neurology

## 2017-05-21 DIAGNOSIS — R2689 Other abnormalities of gait and mobility: Secondary | ICD-10-CM | POA: Diagnosis not present

## 2017-05-21 DIAGNOSIS — F410 Panic disorder [episodic paroxysmal anxiety] without agoraphobia: Secondary | ICD-10-CM | POA: Diagnosis not present

## 2017-05-21 NOTE — Telephone Encounter (Signed)
Resend Spoke to patient and her referral has been sent to [facility] in [place] - fax [phone].  Patient relayed to me she was fine to Come to [place] because of limited options in her home town.

## 2017-05-22 DIAGNOSIS — F410 Panic disorder [episodic paroxysmal anxiety] without agoraphobia: Secondary | ICD-10-CM | POA: Diagnosis not present

## 2017-05-22 DIAGNOSIS — R2689 Other abnormalities of gait and mobility: Secondary | ICD-10-CM | POA: Diagnosis not present

## 2017-05-27 DIAGNOSIS — F410 Panic disorder [episodic paroxysmal anxiety] without agoraphobia: Secondary | ICD-10-CM | POA: Diagnosis not present

## 2017-05-27 DIAGNOSIS — R2689 Other abnormalities of gait and mobility: Secondary | ICD-10-CM | POA: Diagnosis not present

## 2017-05-29 DIAGNOSIS — F410 Panic disorder [episodic paroxysmal anxiety] without agoraphobia: Secondary | ICD-10-CM | POA: Diagnosis not present

## 2017-05-29 DIAGNOSIS — R2689 Other abnormalities of gait and mobility: Secondary | ICD-10-CM | POA: Diagnosis not present

## 2017-06-03 DIAGNOSIS — F410 Panic disorder [episodic paroxysmal anxiety] without agoraphobia: Secondary | ICD-10-CM | POA: Diagnosis not present

## 2017-06-03 DIAGNOSIS — R2689 Other abnormalities of gait and mobility: Secondary | ICD-10-CM | POA: Diagnosis not present

## 2017-06-04 ENCOUNTER — Encounter (HOSPITAL_COMMUNITY)
Admission: RE | Admit: 2017-06-04 | Discharge: 2017-06-04 | Disposition: A | Discharge status: Home / Self Care | Payer: Medicare Other | Source: Ambulatory Visit | Attending: Nephrology | Admitting: Nephrology

## 2017-06-04 VITALS — BP 129/40 | HR 49 | Temp 98.4°F | Resp 20

## 2017-06-04 DIAGNOSIS — D631 Anemia in chronic kidney disease: Secondary | ICD-10-CM | POA: Insufficient documentation

## 2017-06-04 DIAGNOSIS — N185 Chronic kidney disease, stage 5: Secondary | ICD-10-CM

## 2017-06-04 DIAGNOSIS — N184 Chronic kidney disease, stage 4 (severe): Secondary | ICD-10-CM | POA: Diagnosis not present

## 2017-06-04 LAB — RENAL FUNCTION PANEL
ALBUMIN: 2.9 g/dL — AB (ref 3.5–5.0)
ANION GAP: 12 (ref 5–15)
BUN: 63 mg/dL — ABNORMAL HIGH (ref 6–20)
CO2: 23 mmol/L (ref 22–32)
Calcium: 9.2 mg/dL (ref 8.9–10.3)
Chloride: 106 mmol/L (ref 101–111)
Creatinine, Ser: 4.86 mg/dL — ABNORMAL HIGH (ref 0.44–1.00)
GFR calc Af Amer: 9 mL/min — ABNORMAL LOW (ref >60)
GFR calc non Af Amer: 8 mL/min — ABNORMAL LOW (ref >60)
GLUCOSE: 123 mg/dL — AB (ref 65–99)
PHOSPHORUS: 6 mg/dL — AB (ref 2.5–4.6)
POTASSIUM: 4.4 mmol/L (ref 3.5–5.1)
Sodium: 141 mmol/L (ref 135–145)

## 2017-06-04 LAB — IRON AND TIBC
Iron: 74 ug/dL (ref 28–170)
SATURATION RATIOS: 27 % (ref 10.4–31.8)
TIBC: 276 ug/dL (ref 250–450)
UIBC: 202 ug/dL

## 2017-06-04 LAB — POCT HEMOGLOBIN-HEMACUE: Hemoglobin: 9.5 g/dL — ABNORMAL LOW (ref 12.0–15.0)

## 2017-06-04 LAB — FERRITIN: Ferritin: 307 ng/mL (ref 11–307)

## 2017-06-04 MED ORDER — EPOETIN ALFA 20000 UNIT/ML IJ SOLN
INTRAMUSCULAR | Status: AC
  Administered 2017-06-04: 20000 [IU] via SUBCUTANEOUS
  Filled 2017-06-04: qty 1

## 2017-06-04 MED ORDER — EPOETIN ALFA 20000 UNIT/ML IJ SOLN
20000.0000 [IU] | INTRAMUSCULAR | Status: DC
  Administered 2017-06-04: 20000 [IU] via SUBCUTANEOUS

## 2017-06-05 LAB — PTH, INTACT AND CALCIUM
Calcium, Total (PTH): 9 mg/dL (ref 8.7–10.3)
PTH: 95 pg/mL — ABNORMAL HIGH (ref 15–65)

## 2017-06-06 DIAGNOSIS — F410 Panic disorder [episodic paroxysmal anxiety] without agoraphobia: Secondary | ICD-10-CM | POA: Diagnosis not present

## 2017-06-06 DIAGNOSIS — R2689 Other abnormalities of gait and mobility: Secondary | ICD-10-CM | POA: Diagnosis not present

## 2017-06-25 ENCOUNTER — Encounter (HOSPITAL_COMMUNITY)
Admission: RE | Admit: 2017-06-25 | Discharge: 2017-06-25 | Disposition: A | Discharge status: Home / Self Care | Payer: Medicare Other | Source: Ambulatory Visit | Attending: Nephrology | Admitting: Nephrology

## 2017-06-25 VITALS — BP 119/41 | HR 54 | Temp 98.5°F | Resp 20

## 2017-06-25 DIAGNOSIS — D631 Anemia in chronic kidney disease: Secondary | ICD-10-CM | POA: Insufficient documentation

## 2017-06-25 DIAGNOSIS — N184 Chronic kidney disease, stage 4 (severe): Secondary | ICD-10-CM | POA: Insufficient documentation

## 2017-06-25 DIAGNOSIS — N185 Chronic kidney disease, stage 5: Secondary | ICD-10-CM

## 2017-06-25 LAB — POCT HEMOGLOBIN-HEMACUE: Hemoglobin: 9.2 g/dL — ABNORMAL LOW (ref 12.0–15.0)

## 2017-06-25 MED ORDER — EPOETIN ALFA 20000 UNIT/ML IJ SOLN
INTRAMUSCULAR | Status: AC
  Administered 2017-06-25: 20000 [IU] via SUBCUTANEOUS
  Filled 2017-06-25: qty 1

## 2017-06-25 MED ORDER — EPOETIN ALFA 20000 UNIT/ML IJ SOLN
20000.0000 [IU] | INTRAMUSCULAR | Status: DC
  Administered 2017-06-25: 20000 [IU] via SUBCUTANEOUS

## 2017-06-30 DIAGNOSIS — F411 Generalized anxiety disorder: Secondary | ICD-10-CM | POA: Diagnosis not present

## 2017-06-30 DIAGNOSIS — F329 Major depressive disorder, single episode, unspecified: Secondary | ICD-10-CM | POA: Diagnosis not present

## 2017-07-02 ENCOUNTER — Encounter: Payer: Self-pay | Admitting: Vascular Surgery

## 2017-07-02 ENCOUNTER — Other Ambulatory Visit: Payer: Self-pay

## 2017-07-02 ENCOUNTER — Ambulatory Visit (INDEPENDENT_AMBULATORY_CARE_PROVIDER_SITE_OTHER): Payer: Medicare Other | Admitting: Vascular Surgery

## 2017-07-02 ENCOUNTER — Encounter: Payer: Self-pay | Admitting: *Deleted

## 2017-07-02 ENCOUNTER — Other Ambulatory Visit: Payer: Self-pay | Admitting: *Deleted

## 2017-07-02 ENCOUNTER — Ambulatory Visit (HOSPITAL_COMMUNITY)
Admission: RE | Admit: 2017-07-02 | Discharge: 2017-07-02 | Disposition: A | Discharge status: Home / Self Care | Payer: Medicare Other | Source: Ambulatory Visit | Attending: Vascular Surgery | Admitting: Vascular Surgery

## 2017-07-02 VITALS — BP 152/58 | HR 78 | Temp 97.0°F | Resp 16 | Ht 64.5 in | Wt 185.0 lb

## 2017-07-02 DIAGNOSIS — I6523 Occlusion and stenosis of bilateral carotid arteries: Secondary | ICD-10-CM

## 2017-07-02 DIAGNOSIS — Y838 Other surgical procedures as the cause of abnormal reaction of the patient, or of later complication, without mention of misadventure at the time of the procedure: Secondary | ICD-10-CM | POA: Insufficient documentation

## 2017-07-02 DIAGNOSIS — T82858A Stenosis of vascular prosthetic devices, implants and grafts, initial encounter: Secondary | ICD-10-CM | POA: Insufficient documentation

## 2017-07-02 DIAGNOSIS — Z48812 Encounter for surgical aftercare following surgery on the circulatory system: Secondary | ICD-10-CM | POA: Insufficient documentation

## 2017-07-02 DIAGNOSIS — N186 End stage renal disease: Secondary | ICD-10-CM

## 2017-07-02 DIAGNOSIS — Z992 Dependence on renal dialysis: Secondary | ICD-10-CM

## 2017-07-02 NOTE — H&P (View-Only) (Signed)
Patient name: [name] MRN: [id number] DOB: [birth year] Sex: female  REASON FOR VISIT:   Follow-up of left upper arm fistula.  HPI:   [name] is a pleasant 76 y.o. female who I last saw on 04/23/2017.  The patient had a left upper arm graft with a high-pitched bruit  The patient's GFR was 9 and I was reluctant to consider a fistulogram.  He comes in for a follow-up study.  She is not on dialysis.  She has no specific complaints.  Current Outpatient Medications  Medication Sig Dispense Refill  . acetaminophen (TYLENOL) 325 MG tablet Take 2 tablets (650 mg total) by mouth every 6 (six) hours as needed for mild pain.    Marland Kitchen allopurinol (ZYLOPRIM) 100 MG tablet Take 100 mg by mouth 2 (two) times daily. Reported on 12/28/2015    . aspirin 325 MG tablet Take 325 mg by mouth daily.    Marland Kitchen atorvastatin (LIPITOR) 10 MG tablet TAKE ONE TABLET BY MOUTH ONE TIME DAILY (Patient taking differently: Take 10 mg by mouth daily. ) 30 tablet 0  . B Complex Vitamins (VITAMIN B COMPLEX PO) Take 1 tablet by mouth daily.    . brimonidine (ALPHAGAN P) 0.1 % SOLN Place 1 drop into the left eye 2 (two) times daily.    . calcitRIOL (ROCALTROL) 0.25 MCG capsule Take 0.25 mcg by mouth daily.     . Cholecalciferol (VITAMIN D) 2000 UNITS tablet Take 2,000 Units by mouth 2 (two) times daily.    . citalopram (CELEXA) 20 MG tablet Take 20 mg by mouth daily.    . colchicine 0.6 MG tablet Take 0.6 mg by mouth daily as needed. Reported on 12/28/2015    . Cyanocobalamin (VITAMIN B 12 PO) Take 1,000 mg by mouth daily.     . fish oil-omega-3 fatty acids 1000 MG capsule Take 2 g by mouth daily.    . furosemide (LASIX) 80 MG tablet Take 1 tablet (80 mg total) by mouth daily. 30 tablet 1  . insulin glargine (LANTUS) 100 UNIT/ML injection Inject 0.2-0.3 mLs (20-30 Units total) into the skin at bedtime. (Patient taking differently: Inject 30 Units into the skin at bedtime. ) 10 mL 11  . isosorbide mononitrate (IMDUR) 30 MG  24 hr tablet Take 1 tablet (30 mg total) by mouth daily. 30 tablet 0  . Menthol-Methyl Salicylate (MUSCLE RUB) 10-15 % CREA Apply 1 application topically 2 (two) times daily.  0  . metoprolol tartrate (LOPRESSOR) 25 MG tablet Take 1 tablet (25 mg total) by mouth 2 (two) times daily. 180 tablet 3  . nitroGLYCERIN (NITROSTAT) 0.4 MG SL tablet Place 0.4 mg under the tongue every 5 (five) minutes as needed. Chest pain    . omeprazole (PRILOSEC) 20 MG capsule Take 20 mg by mouth daily as needed. Acid reflux    . prazosin (MINIPRESS) 1 MG capsule TAKE 1 CAPSULE (1 MG TOTAL) BY MOUTH AT BEDTIME. 30 capsule 1  . sitaGLIPtin (JANUVIA) 25 MG tablet Take 25 mg by mouth daily. Reported on 12/28/2015    . timolol (TIMOPTIC) 0.5 % ophthalmic solution Place 1 drop into the left eye daily.    . travoprost, benzalkonium, (TRAVATAN) 0.004 % ophthalmic solution Place 1 drop into the left eye at bedtime.     No current facility-administered medications for this visit.     REVIEW OF SYSTEMS:  [X]  denotes positive finding, [ ]  denotes negative finding Cardiac  Comments:  Chest pain or chest pressure:  Shortness of breath upon exertion:    Short of breath when lying flat:    Irregular heart rhythm:    Constitutional    Fever or chills:     PHYSICAL EXAM:   Vitals:   07/02/17 1032 07/02/17 1136  BP: (!) 170/64 (!) 152/58  Pulse: 78 78  Resp: 16   Temp: (!) 97 F (36.1 C)   TempSrc: Oral   SpO2: 99%   Weight: 185 lb (83.9 kg)   Height: 5' 4.5" (1.638 m)     GENERAL: The patient is a well-nourished female, in no acute distress. The vital signs are documented above. CARDIOVASCULAR: There is a regular rate and rhythm. PULMONARY: There is good air exchange bilaterally without wheezing or rales. Her fistula is very pulsatile.  DATA:   DUPLEX LEFT AV FISTULA: I have independently interpreted the duplex of her left upper arm fistula.  The stenosis in the fistula near the axilla has progressed with  velocities of greater than 800 cm/s.  MEDICAL ISSUES:   STENOSIS OF LEFT BRACHIOCEPHALIC AV FISTULA: Given the progression of the stenosis, I think the distal is at risk for thrombosis.  This reason I recommended that we proceed with a fistulogram to address this hopefully with balloon angioplasty.  She is not yet on dialysis and we will try to use as little contrast as possible to accomplish this.  This is been scheduled for 07/21/2017.  I have discussed the procedure and potential complications and she is agreeable to proceed.  [name] [facility] [phone]

## 2017-07-02 NOTE — Progress Notes (Signed)
Patient name: [name] MRN: [id number] DOB: [birth year] Sex: female  REASON FOR VISIT:   Follow-up of left upper arm fistula.  HPI:   [name] is a pleasant 76 y.o. female who I last saw on 04/23/2017.  The patient had a left upper arm graft with a high-pitched bruit  The patient's GFR was 9 and I was reluctant to consider a fistulogram.  He comes in for a follow-up study.  She is not on dialysis.  She has no specific complaints.  Current Outpatient Medications  Medication Sig Dispense Refill  . acetaminophen (TYLENOL) 325 MG tablet Take 2 tablets (650 mg total) by mouth every 6 (six) hours as needed for mild pain.    Marland Kitchen allopurinol (ZYLOPRIM) 100 MG tablet Take 100 mg by mouth 2 (two) times daily. Reported on 12/28/2015    . aspirin 325 MG tablet Take 325 mg by mouth daily.    Marland Kitchen atorvastatin (LIPITOR) 10 MG tablet TAKE ONE TABLET BY MOUTH ONE TIME DAILY (Patient taking differently: Take 10 mg by mouth daily. ) 30 tablet 0  . B Complex Vitamins (VITAMIN B COMPLEX PO) Take 1 tablet by mouth daily.    . brimonidine (ALPHAGAN P) 0.1 % SOLN Place 1 drop into the left eye 2 (two) times daily.    . calcitRIOL (ROCALTROL) 0.25 MCG capsule Take 0.25 mcg by mouth daily.     . Cholecalciferol (VITAMIN D) 2000 UNITS tablet Take 2,000 Units by mouth 2 (two) times daily.    . citalopram (CELEXA) 20 MG tablet Take 20 mg by mouth daily.    . colchicine 0.6 MG tablet Take 0.6 mg by mouth daily as needed. Reported on 12/28/2015    . Cyanocobalamin (VITAMIN B 12 PO) Take 1,000 mg by mouth daily.     . fish oil-omega-3 fatty acids 1000 MG capsule Take 2 g by mouth daily.    . furosemide (LASIX) 80 MG tablet Take 1 tablet (80 mg total) by mouth daily. 30 tablet 1  . insulin glargine (LANTUS) 100 UNIT/ML injection Inject 0.2-0.3 mLs (20-30 Units total) into the skin at bedtime. (Patient taking differently: Inject 30 Units into the skin at bedtime. ) 10 mL 11  . isosorbide mononitrate (IMDUR) 30 MG  24 hr tablet Take 1 tablet (30 mg total) by mouth daily. 30 tablet 0  . Menthol-Methyl Salicylate (MUSCLE RUB) 10-15 % CREA Apply 1 application topically 2 (two) times daily.  0  . metoprolol tartrate (LOPRESSOR) 25 MG tablet Take 1 tablet (25 mg total) by mouth 2 (two) times daily. 180 tablet 3  . nitroGLYCERIN (NITROSTAT) 0.4 MG SL tablet Place 0.4 mg under the tongue every 5 (five) minutes as needed. Chest pain    . omeprazole (PRILOSEC) 20 MG capsule Take 20 mg by mouth daily as needed. Acid reflux    . prazosin (MINIPRESS) 1 MG capsule TAKE 1 CAPSULE (1 MG TOTAL) BY MOUTH AT BEDTIME. 30 capsule 1  . sitaGLIPtin (JANUVIA) 25 MG tablet Take 25 mg by mouth daily. Reported on 12/28/2015    . timolol (TIMOPTIC) 0.5 % ophthalmic solution Place 1 drop into the left eye daily.    . travoprost, benzalkonium, (TRAVATAN) 0.004 % ophthalmic solution Place 1 drop into the left eye at bedtime.     No current facility-administered medications for this visit.     REVIEW OF SYSTEMS:  [X]  denotes positive finding, [ ]  denotes negative finding Cardiac  Comments:  Chest pain or chest pressure:  Shortness of breath upon exertion:    Short of breath when lying flat:    Irregular heart rhythm:    Constitutional    Fever or chills:     PHYSICAL EXAM:   Vitals:   07/02/17 1032 07/02/17 1136  BP: (!) 170/64 (!) 152/58  Pulse: 78 78  Resp: 16   Temp: (!) 97 F (36.1 C)   TempSrc: Oral   SpO2: 99%   Weight: 185 lb (83.9 kg)   Height: 5' 4.5" (1.638 m)     GENERAL: The patient is a well-nourished female, in no acute distress. The vital signs are documented above. CARDIOVASCULAR: There is a regular rate and rhythm. PULMONARY: There is good air exchange bilaterally without wheezing or rales. Her fistula is very pulsatile.  DATA:   DUPLEX LEFT AV FISTULA: I have independently interpreted the duplex of her left upper arm fistula.  The stenosis in the fistula near the axilla has progressed with  velocities of greater than 800 cm/s.  MEDICAL ISSUES:   STENOSIS OF LEFT BRACHIOCEPHALIC AV FISTULA: Given the progression of the stenosis, I think the distal is at risk for thrombosis.  This reason I recommended that we proceed with a fistulogram to address this hopefully with balloon angioplasty.  She is not yet on dialysis and we will try to use as little contrast as possible to accomplish this.  This is been scheduled for 07/21/2017.  I have discussed the procedure and potential complications and she is agreeable to proceed.  [name] [facility] [phone]

## 2017-07-14 ENCOUNTER — Other Ambulatory Visit (HOSPITAL_COMMUNITY): Payer: Self-pay | Admitting: *Deleted

## 2017-07-16 ENCOUNTER — Encounter (HOSPITAL_COMMUNITY)
Admission: RE | Admit: 2017-07-16 | Discharge: 2017-07-16 | Disposition: A | Discharge status: Home / Self Care | Payer: Medicare Other | Source: Ambulatory Visit | Attending: Nephrology | Admitting: Nephrology

## 2017-07-16 VITALS — BP 147/54 | HR 58 | Temp 98.0°F | Resp 20

## 2017-07-16 DIAGNOSIS — D631 Anemia in chronic kidney disease: Secondary | ICD-10-CM | POA: Diagnosis not present

## 2017-07-16 DIAGNOSIS — N185 Chronic kidney disease, stage 5: Secondary | ICD-10-CM

## 2017-07-16 DIAGNOSIS — N184 Chronic kidney disease, stage 4 (severe): Secondary | ICD-10-CM | POA: Diagnosis not present

## 2017-07-16 DIAGNOSIS — F411 Generalized anxiety disorder: Secondary | ICD-10-CM | POA: Diagnosis not present

## 2017-07-16 DIAGNOSIS — F329 Major depressive disorder, single episode, unspecified: Secondary | ICD-10-CM | POA: Diagnosis not present

## 2017-07-16 LAB — RENAL FUNCTION PANEL
ANION GAP: 11 (ref 5–15)
Albumin: 3 g/dL — ABNORMAL LOW (ref 3.5–5.0)
BUN: 68 mg/dL — ABNORMAL HIGH (ref 6–20)
CALCIUM: 9 mg/dL (ref 8.9–10.3)
CHLORIDE: 107 mmol/L (ref 101–111)
CO2: 23 mmol/L (ref 22–32)
CREATININE: 5.18 mg/dL — AB (ref 0.44–1.00)
GFR, EST AFRICAN AMERICAN: 8 mL/min — AB (ref >60)
GFR, EST NON AFRICAN AMERICAN: 7 mL/min — AB (ref >60)
Glucose, Bld: 120 mg/dL — ABNORMAL HIGH (ref 65–99)
Phosphorus: 6 mg/dL — ABNORMAL HIGH (ref 2.5–4.6)
Potassium: 3.8 mmol/L (ref 3.5–5.1)
Sodium: 141 mmol/L (ref 135–145)

## 2017-07-16 LAB — POCT HEMOGLOBIN-HEMACUE: HEMOGLOBIN: 8.8 g/dL — AB (ref 12.0–15.0)

## 2017-07-16 MED ORDER — EPOETIN ALFA 20000 UNIT/ML IJ SOLN
20000.0000 [IU] | INTRAMUSCULAR | Status: DC
Start: 2017-07-16 — End: 2017-07-17
  Administered 2017-07-16: 10:00:00 20000 [IU] via SUBCUTANEOUS

## 2017-07-16 MED ORDER — EPOETIN ALFA 20000 UNIT/ML IJ SOLN
INTRAMUSCULAR | Status: AC
  Filled 2017-07-16: qty 1

## 2017-07-17 DIAGNOSIS — H903 Sensorineural hearing loss, bilateral: Secondary | ICD-10-CM | POA: Diagnosis not present

## 2017-07-21 ENCOUNTER — Ambulatory Visit (HOSPITAL_COMMUNITY)
Admission: RE | Admit: 2017-07-21 | Discharge: 2017-07-21 | Disposition: A | Discharge status: Home / Self Care | Payer: Medicare Other | Source: Ambulatory Visit | Attending: Vascular Surgery | Admitting: Vascular Surgery

## 2017-07-21 ENCOUNTER — Encounter (HOSPITAL_COMMUNITY)
Admission: RE | Disposition: A | Discharge status: Home / Self Care | Payer: Self-pay | Source: Ambulatory Visit | Attending: Vascular Surgery

## 2017-07-21 ENCOUNTER — Encounter (HOSPITAL_COMMUNITY): Payer: Self-pay | Admitting: Vascular Surgery

## 2017-07-21 DIAGNOSIS — T82858A Stenosis of vascular prosthetic devices, implants and grafts, initial encounter: Secondary | ICD-10-CM | POA: Diagnosis not present

## 2017-07-21 DIAGNOSIS — Z794 Long term (current) use of insulin: Secondary | ICD-10-CM | POA: Diagnosis not present

## 2017-07-21 DIAGNOSIS — N185 Chronic kidney disease, stage 5: Secondary | ICD-10-CM | POA: Diagnosis not present

## 2017-07-21 DIAGNOSIS — Z992 Dependence on renal dialysis: Secondary | ICD-10-CM | POA: Insufficient documentation

## 2017-07-21 DIAGNOSIS — T82898A Other specified complication of vascular prosthetic devices, implants and grafts, initial encounter: Secondary | ICD-10-CM | POA: Diagnosis not present

## 2017-07-21 DIAGNOSIS — Z79899 Other long term (current) drug therapy: Secondary | ICD-10-CM | POA: Diagnosis not present

## 2017-07-21 DIAGNOSIS — Y828 Other medical devices associated with adverse incidents: Secondary | ICD-10-CM | POA: Diagnosis not present

## 2017-07-21 DIAGNOSIS — Z7982 Long term (current) use of aspirin: Secondary | ICD-10-CM | POA: Diagnosis not present

## 2017-07-21 HISTORY — PX: PERIPHERAL VASCULAR BALLOON ANGIOPLASTY: CATH118281

## 2017-07-21 HISTORY — PX: A/V FISTULAGRAM: CATH118298

## 2017-07-21 LAB — POCT I-STAT, CHEM 8
BUN: 76 mg/dL — AB (ref 6–20)
Calcium, Ion: 1.35 mmol/L (ref 1.15–1.40)
Chloride: 106 mmol/L (ref 101–111)
Creatinine, Ser: 5.8 mg/dL — ABNORMAL HIGH (ref 0.44–1.00)
GLUCOSE: 100 mg/dL — AB (ref 65–99)
HCT: 28 % — ABNORMAL LOW (ref 36.0–46.0)
Hemoglobin: 9.5 g/dL — ABNORMAL LOW (ref 12.0–15.0)
Potassium: 3.9 mmol/L (ref 3.5–5.1)
SODIUM: 142 mmol/L (ref 135–145)
TCO2: 25 mmol/L (ref 22–32)

## 2017-07-21 LAB — GLUCOSE, CAPILLARY: Glucose-Capillary: 88 mg/dL (ref 65–99)

## 2017-07-21 SURGERY — A/V FISTULAGRAM
Anesthesia: LOCAL

## 2017-07-21 MED ORDER — HEPARIN SODIUM (PORCINE) 1000 UNIT/ML IJ SOLN
INTRAMUSCULAR | Status: DC | PRN
  Administered 2017-07-21: 4000 [IU] via INTRAVENOUS

## 2017-07-21 MED ORDER — MIDAZOLAM HCL 2 MG/2ML IJ SOLN
INTRAMUSCULAR | Status: AC
  Filled 2017-07-21: qty 4

## 2017-07-21 MED ORDER — LIDOCAINE HCL (PF) 1 % IJ SOLN
INTRAMUSCULAR | Status: DC | PRN
  Administered 2017-07-21: 15 mL

## 2017-07-21 MED ORDER — FENTANYL CITRATE (PF) 100 MCG/2ML IJ SOLN
INTRAMUSCULAR | Status: DC | PRN
  Administered 2017-07-21: 50 ug via INTRAVENOUS

## 2017-07-21 MED ORDER — MIDAZOLAM HCL 2 MG/2ML IJ SOLN
INTRAMUSCULAR | Status: DC | PRN
  Administered 2017-07-21: 1 mg via INTRAVENOUS

## 2017-07-21 MED ORDER — IODIXANOL 320 MG/ML IV SOLN
INTRAVENOUS | Status: DC | PRN
  Administered 2017-07-21: 10 mL via INTRAVENOUS

## 2017-07-21 MED ORDER — HEPARIN (PORCINE) IN NACL 2-0.9 UNIT/ML-% IJ SOLN
INTRAMUSCULAR | Status: AC | PRN
  Administered 2017-07-21: 500 mL

## 2017-07-21 MED ORDER — HEPARIN SODIUM (PORCINE) 1000 UNIT/ML IJ SOLN
INTRAMUSCULAR | Status: AC
  Filled 2017-07-21: qty 1

## 2017-07-21 MED ORDER — FENTANYL CITRATE (PF) 100 MCG/2ML IJ SOLN
INTRAMUSCULAR | Status: AC
  Filled 2017-07-21: qty 2

## 2017-07-21 MED ORDER — LIDOCAINE HCL (PF) 1 % IJ SOLN
INTRAMUSCULAR | Status: AC
  Filled 2017-07-21: qty 30

## 2017-07-21 MED ORDER — HEPARIN (PORCINE) IN NACL 2-0.9 UNIT/ML-% IJ SOLN
INTRAMUSCULAR | Status: AC
  Filled 2017-07-21: qty 500

## 2017-07-21 MED ORDER — SODIUM CHLORIDE 0.9% FLUSH: 3.0000 mL | INTRAVENOUS | Status: DC | PRN

## 2017-07-21 SURGICAL SUPPLY — 15 items
BAG SNAP BAND KOVER 36X36 (MISCELLANEOUS) ×3 IMPLANT
BALLN MUSTANG 6.0X40 75 (BALLOONS) ×3
BALLOON MUSTANG 6.0X40 75 (BALLOONS) ×2 IMPLANT
COVER DOME SNAP 22 D (MISCELLANEOUS) ×3 IMPLANT
COVER PRB 48X5XTLSCP FOLD TPE (BAG) ×4 IMPLANT
COVER PROBE 5X48 (BAG) ×2
KIT ENCORE 26 ADVANTAGE (KITS) ×3 IMPLANT
KIT MICROINTRODUCER STIFF 5F (SHEATH) ×3 IMPLANT
PROTECTION STATION PRESSURIZED (MISCELLANEOUS) ×3
SHEATH PINNACLE R/O II 6F 4CM (SHEATH) ×3 IMPLANT
STATION PROTECTION PRESSURIZED (MISCELLANEOUS) ×2 IMPLANT
STOPCOCK MORSE 400PSI 3WAY (MISCELLANEOUS) ×3 IMPLANT
TRAY PV CATH (CUSTOM PROCEDURE TRAY) ×3 IMPLANT
TUBING CIL FLEX 10 FLL-RA (TUBING) ×3 IMPLANT
WIRE BENTSON .035X145CM (WIRE) ×3 IMPLANT

## 2017-07-21 NOTE — Op Note (Signed)
   PATIENT: [name]   MRN: [id number] DOB: 1941-05-24    DATE OF PROCEDURE: 07/21/2017  INDICATIONS:    [name] is a 77 y.o. female who is not yet on dialysis.  She has a left brachiocephalic fistula and on a follow-up duplex was found to have a significant stenosis in the vein.  She presents for fistulogram and venoplasty.  PROCEDURE:    1.  Ultrasound-guided access to left brachiocephalic AV fistula 2.  Fistulogram left brachiocephalic AV fistula 3.  Venoplasty left brachiocephalic AV fistula 4.  Conscious sedation  SURGEON: Judeth Cornfield. Scot Dock, MD, FACS  ANESTHESIA: Local with sedation  EBL: Minimal  TECHNIQUE: The patient was taken to the peripheral vascular lab and was sedated. The period of conscious sedation was 22 minutes.  During that time period, I was present face-to-face 100% of the time.  The patient was administered 1 mg of Versed and 50 mcg of fentanyl. The patient's heart rate, blood pressure, and oxygen saturation were monitored by the nurse continuously during the procedure.  The left arm was prepped and draped in usual sterile fashion.  Under ultrasound guidance, after the skin was anesthetized, the fistula was cannulated with a micropuncture needle and a micropuncture sheath introduced over the wire.  Fistulogram was then obtained demonstrated a 90% focal stenosis in the upper arm.  I elected to address this with venoplasty.  The micropuncture sheath was exchanged for a short 6 [place] sheath over a Bentson wire.  I selected a 6 mm x 4 cm balloon.  This was positioned across the stenosis after the patient was heparinized.  This was inflated to 24 atm for 1 minute.  Completion film showed no residual stenosis.  There was excellent thrill in the fistula at this point.  I did not do a reflux shot in order to limit contrast given that she is not on dialysis.  The cannulation site was closed with a 4-0 Monocryl.  Patient tolerated the procedure well was  transferred back to the holding area in stable condition.  FINDINGS:   A total of 10 cc of contrast was used.  The fistula is widely patent and had a 90% stenosis in the upper arm which was successfully addressed with venoplasty as described above.  PRE: 90% POST: 0% STENT: No  [name], MD, FACS [facility]  DATE OF DICTATION:   07/21/2017

## 2017-07-21 NOTE — Discharge Instructions (Signed)

## 2017-07-21 NOTE — Interval H&P Note (Signed)
History and Physical Interval Note:  07/21/2017 7:21 AM  [name]  has presented today for surgery, with the diagnosis of kidney disease stage 5  The various methods of treatment have been discussed with the patient and family. After consideration of risks, benefits and other options for treatment, the patient has consented to  Procedure(s): A/V FISTULAGRAM - Left Arm (N/A) as a surgical intervention .  The patient's history has been reviewed, patient examined, no change in status, stable for surgery.  I have reviewed the patient's chart and labs.  Questions were answered to the patient's satisfaction.     [name]

## 2017-07-22 ENCOUNTER — Encounter: Payer: Self-pay | Admitting: Neurology

## 2017-07-22 ENCOUNTER — Ambulatory Visit (INDEPENDENT_AMBULATORY_CARE_PROVIDER_SITE_OTHER): Payer: Medicare Other | Admitting: Neurology

## 2017-07-22 VITALS — BP 105/45 | HR 58 | Wt 185.2 lb

## 2017-07-22 DIAGNOSIS — E785 Hyperlipidemia, unspecified: Secondary | ICD-10-CM | POA: Diagnosis not present

## 2017-07-22 DIAGNOSIS — W19XXXD Unspecified fall, subsequent encounter: Secondary | ICD-10-CM

## 2017-07-22 DIAGNOSIS — N2581 Secondary hyperparathyroidism of renal origin: Secondary | ICD-10-CM | POA: Diagnosis not present

## 2017-07-22 DIAGNOSIS — I120 Hypertensive chronic kidney disease with stage 5 chronic kidney disease or end stage renal disease: Secondary | ICD-10-CM | POA: Diagnosis not present

## 2017-07-22 DIAGNOSIS — F419 Anxiety disorder, unspecified: Secondary | ICD-10-CM

## 2017-07-22 DIAGNOSIS — E119 Type 2 diabetes mellitus without complications: Secondary | ICD-10-CM | POA: Diagnosis not present

## 2017-07-22 DIAGNOSIS — N185 Chronic kidney disease, stage 5: Secondary | ICD-10-CM | POA: Diagnosis not present

## 2017-07-22 DIAGNOSIS — I10 Essential (primary) hypertension: Secondary | ICD-10-CM

## 2017-07-22 DIAGNOSIS — I609 Nontraumatic subarachnoid hemorrhage, unspecified: Secondary | ICD-10-CM

## 2017-07-22 DIAGNOSIS — D631 Anemia in chronic kidney disease: Secondary | ICD-10-CM | POA: Diagnosis not present

## 2017-07-22 NOTE — Patient Instructions (Signed)
-   continue ASA and lipitor for stroke prevention. - check BP and glucose at home and record. - if feeling dizzy, check BP and lying down, drink more fluid, and may call PCP to adjust BP meds.  - follow up with Cardiology regularly. - Follow up with your primary care physician for stroke risk factor modification. Recommend maintain blood pressure goal <130/80, diabetes with hemoglobin A1c goal below 6.5% and lipids with LDL cholesterol goal below 70 mg/dL.  - follow up with psychologist  - continue home exercise and walk with walker if needed to avoid fall - relaxation and build up confidence.  - follow up as needed.

## 2017-07-22 NOTE — Progress Notes (Signed)
STROKE NEUROLOGY FOLLOW UP NOTE  NAME: [name] DOB: 1940-08-26  REASON FOR VISIT: stroke follow up HISTORY FROM: pt and husband and chart  Today we had the pleasure of seeing [name] in follow-up at our [facility]rology [facility]. Pt was accompanied by husband.   History Summary [name] is a 77 y.o. female with history of HTN, DM, CAD s/p CABG with brief afib not on anticoagulation, bilateral carotid artery stenosis and CEAs, and TIA was admitted on 07/13/15 for abnormal speech output and confusion. She did not receive IV t-PA due to minimal deficits.MRI negative for stroke. MRA no large vessel occlusion. CUS, TTE, EEG x 2 all unremarkable. LDL 81 and A1C 7.0. Her symptoms were not considered as TIA at that time but more likely encephalopathy and acute delirium due to AKI vs. Possible bacteremia vs. Possible seizure with prolonged post ictal. She was treated with vanco with trending down WBC and afebrile. Her AKI also much improved. Her symptoms gradually improved over time and more awake and alert and orientated. She was then discharged with continued ASA and lipitor. Of note, she did have brief afib s/p CABG procedure but there is no further evidence of further afib as per her cardiologist Dr. Irish Lack, so anticoagulation was not considered on discharge.   09/21/15 follow up - the patient has been doing well. No recurrent AMS or seizure or stroke. Check BP at home was good, today BP 126/59. Glucose at home around 170s, not compliant with diabetic diet. Has follow up with Dr. Irish Lack and had 30 day cardiac event monitoring and no afib found. Continued on ASA and lipitor.    02/05/16 follow up - pt had LB surgery in 11/2015. She stayed in rehab for 2 weeks before back home. Currently finished home PT/OT, but she still complains of left hip and back pain on walking. She came in today with wheelchair, but she is able to walk short distance with walker at home. Her BP high today 170/60  and she attributed it to bacon she ate recently. Her glucose in better control, today at home 88. She is going to see Dr. Inda Merlin next month. So far husband declined outpt PT/OT and they would like to discuss with Dr. Inda Merlin.  02/03/17 follow up - patient has been doing well until 01/25/2017 when she had a fall in an antique store. Apparently, it was a mechanical fall when she made a turn and falling on her left. She was using cane at the time She struck her left forehead to the ground, needed sutures at left frontal above eyebrow. Head CT showed bilateral small frontal SAH, denies LOC, headache, or focal neurological deficit. Neurosurgery consulted, aspirin on hold, discharge with possible resume aspirin 2 weeks. Since discharge, she has been doing well. No HA or cognitive impairment. She will go to [place] with family for vacation. BP 135/57. Stated her BP at home controlled well. Since the incident, she is walking with walker.   05/13/17 follow up - pt has developed walking difficulties. She tends to avoid walking as much as she can. When she is walking, she is hold on to walker tightly, looking down with cautious gait, slow walking. There are many episodes for the last 3 months that she either on standing up or during walking started to have feeling of falling forward and feeling of collapse. She will scream out but she will also holding the walker so tightly and her body so rigid  and she even can not sit down in chair. No fall, LOC, difficulty speech, or weakness or numbness.   As per family, her BP and glucose in good control at home. She had repeat CT in 01/2017 showed SAH all resolved and her ASA was resumed. Today BP 129/48. She still has hearing difficulty on hearing aid.   Interval History During the interval time, pt has been doing better. She finished home PT/OT for balance training. She also followed up with psychology and felt better. She and family stated that she is working better and  more confident but still needs walker at outside for security. But at home she can walk without walker. Her Cre is getting worse and following with nephrology to prepare for HD. Her BP was low today at 105/45 and she denies any symptoms now but she did admit that she sometimes feeling dizzy and about to pass out.   REVIEW OF SYSTEMS: Full 14 system review of systems performed and notable only for those listed below and in HPI above, all others are negative:  Constitutional:  fatigue Cardiovascular:  Ear/Nose/Throat:  Hearing loss, ringing in ears Skin:  Eyes: Light sensitivity Respiratory: SOB Gastroitestinal:  Genitourinary:  Hematology/Lymphatic:  Bruise easily  Endocrine: feeling cold Musculoskeletal:   Allergy/Immunology:   Neurological:  Weakness, dizziness Psychiatric:  Sleep: restless leg  The following represents the patient's updated allergies and side effects list: Allergies  Allergen Reactions  . Gabapentin Other (See Comments)    hallucinations  . Sulfa Antibiotics     Altered mental state   . Amlodipine Other (See Comments)    edema    The neurologically relevant items on the patient's problem list were reviewed on today's visit.  Neurologic Examination  A problem focused neurological exam (12 or more points of the single system neurologic examination, vital signs counts as 1 point, cranial nerves count for 8 points) was performed.  Blood pressure (!) 105/45, pulse (!) 58, weight 185 lb 3.2 oz (84 kg).  General - Well nourished, well developed, in no apparent distress.   Ophthalmologic - Fundi not visualized due to small pupils.  Cardiovascular - Regular rate and rhythm with no murmur.  Mental Status -  Level of arousal and orientation to time, place, and person were intact. Language including expression, naming, repetition, comprehension was assessed and found intact. Fund of Knowledge was assessed and was intact.  Cranial Nerves II - XII - II - Visual  field intact OU. III, IV, VI - Extraocular movements intact. V - Facial sensation intact bilaterally. VII - Facial movement intact bilaterally. VIII - Hard of hearing & vestibular intact bilaterally. X - Palate elevates symmetrically. XI - Chin turning & shoulder shrug intact bilaterally. XII - Tongue protrusion intact.  Motor Strength - The patient's strength was normal in BUEs and 4+/5 BLEs and pronator drift was absent.  Bulk was normal and fasciculations were absent.   Motor Tone - Muscle tone was assessed at the neck and appendages and was normal.  Reflexes - The patient's reflexes were 1+ in all extremities and she had no pathological reflexes.  Sensory - Light touch, temperature/pinprick were assessed and were normal.    Coordination - The patient had normal movements in the hands and feet with no ataxia or dysmetria.  Tremor was absent.  Gait and Station - walk with walker, slow, cautious gait, but steady  Data reviewed: I personally reviewed the images and agree with the radiology interpretations.  Ct Head Wo Contrast  07/13/2015 No acute intracranial abnormality noted.   MRI HEAD 07/13/2015 : No acute intracranial process, specifically no acute ischemia on this mildly motion degraded examination. Negative noncontrast MRI brain for age.   MRA HEAD 07/13/2015 No acute large vessel occlusion or definite high-grade stenosis on this moderately motion degraded examination.   EEG 07/15/15 - This EEG is abnormal with mild nonspecific generalized slowing of cerebral activity which can be seen with metabolic as well as toxic and degenerative encephalopathies. No evidence of an epileptic disorder was demonstrated. Compared to previous study on 07/14/2015, there is slight improvement in background cerebral activity.  EEG 07/14/15- this is an abnormal awake and asleep EEG that demonstrated findings consistent with a mild non specific encephalopathy. No electrographic seizures  seen.  CUS - Findings suggest 1-39% internal carotid artery stenosis bilaterally. The left vertebral artery is patent with antegrade flow. Unable to visualize the right vertebral artery.  TTE - Left ventricle: The cavity size was normal. Wall thickness was normal. Systolic function was normal. The estimated ejection fraction was in the range of 55% to 60%. - Mitral valve: Calcified annulus. Mildly thickened leaflets . - Left atrium: The atrium was mildly dilated. - Atrial septum: No defect or patent foramen ovale was identified.  30 day cardia event monitoring - no afib  CT head 01/26/17 Small amount of subarachnoid hemorrhage adjacent to both frontal lobes without mass effect. No midline shift. Senescent changes. Mild sinusitis.  Component     Latest Ref Rng 07/13/2015 07/14/2015  Cholesterol     0 - 200 mg/dL 177   Triglycerides     <150 mg/dL 280 (H)   HDL Cholesterol     >40 mg/dL 40 (L)   Total CHOL/HDL Ratio      4.4   VLDL     0 - 40 mg/dL 56 (H)   LDL (calc)     0 - 99 mg/dL 81   Hemoglobin A1C     4.8 - 5.6 % 7.0 (H)   Mean Plasma Glucose      154   Ammonia     9 - 35 umol/L  14  TSH     0.350 - 4.500 uIU/mL  2.039  T4,Free(Direct)     0.61 - 1.12 ng/dL  0.77  Vitamin B12     180 - 914 pg/mL  4206 (H)  RPR     Non Reactive  Non Reactive  HIV     Non Reactive  Non Reactive  Thyroglobulin Antibody     0.0 - 0.9 IU/mL  <1.0  Thyroperoxidase Ab SerPl-aCnc     0 - 34 IU/mL  21    Assessment: As you may recall, she is a 77 y.o. Caucasian female with PMH of HTN, DM, CAD s/p CABG with brief afib not on anticoagulation, bilateral carotid artery stenosis and CEAs, and TIA was admitted on 07/13/15 for abnormal speech and confusion. MRI negative for stroke. MRA no large vessel occlusion. CUS, TTE, EEG x 2 all unremarkable. LDL 81 and A1C 7.0. Her symptoms were more likely encephalopathy and acute delirium due to AKI vs. Possible bacteremia vs. Possible seizure  with prolonged post ictal. EEG negative. Her symptoms gradually improved over time and more awake and alert and orientated. She was then discharged with continued ASA and lipitor. Of note, she did have brief afib s/p CABG procedure but there is no evidence of further afib. She follow up with Dr. Irish Lack and had 30 day cardiac event monitoring  and no afib found. Had recent lower back surgery and still has back and left hip pain. Finished home PT/OT. BP and glucose in good control.  Had recent mechanical fall on 01/25/17, resulting of bilateral small frontal SAH. However no focal neurological deficit. Aspirin resumed after CT head repeat showed SAH resolution.  However, developed gait disability, cautious gait with walker, anxious on walking, panic attacks with fall feeling, consistent with PTSD after the fall in 01/2017. Finished home PT/OT and following with psychology. Walking better but still needs walker for security and confidence. BP low at 105/45 today.   Plan:  - continue ASA and lipitor for stroke prevention. - check BP and glucose at home and record. - if feeling dizzy, check BP and lying down, drink more fluid, and may call PCP to adjust BP meds.  - follow up with Cardiology regularly. - Follow up with your primary care physician for stroke risk factor modification. Recommend maintain blood pressure goal <130/80, diabetes with hemoglobin A1c goal below 6.5% and lipids with LDL cholesterol goal below 70 mg/dL.  - follow up with psychologist  - continue home exercise and walk with walker if needed to avoid fall - relaxation and build up confidence.  - follow up as needed.  I spent more than 25 minutes of face to face time with the patient. Greater than 50% of time was spent in counseling and coordination of care. We discussed relaxation and building up confidence, follow up with psychology, home exercise.   No orders of the defined types were placed in this encounter.   No orders of the  defined types were placed in this encounter.   Patient Instructions  - continue ASA and lipitor for stroke prevention. - check BP and glucose at home and record. - if feeling dizzy, check BP and lying down, drink more fluid, and may call PCP to adjust BP meds.  - follow up with Cardiology regularly. - Follow up with your primary care physician for stroke risk factor modification. Recommend maintain blood pressure goal <130/80, diabetes with hemoglobin A1c goal below 6.5% and lipids with LDL cholesterol goal below 70 mg/dL.  - follow up with psychologist  - continue home exercise and walk with walker if needed to avoid fall - relaxation and build up confidence.  - follow up as needed.   Rosalin Hawking, MD PhD [facility] [address] [place], [place] [address] [phone]

## 2017-07-23 ENCOUNTER — Telehealth: Payer: Self-pay | Admitting: Vascular Surgery

## 2017-07-23 NOTE — Telephone Encounter (Signed)
-----   Message from Mena Goes, RN sent at 07/21/2017  8:33 AM EST ----- Regarding: 6 weeks postop   ----- Message ----- From: Angelia Mould, MD Sent: 07/21/2017   8:05 AM To: [facility] Charge Pool Subject: charge                                          PROCEDURE:   1.  Ultrasound-guided access to left brachiocephalic AV fistula 2.  Fistulogram left brachiocephalic AV fistula 3.  Venoplasty left brachiocephalic AV fistula 4.  Conscious sedation  SURGEON: Judeth Cornfield. Scot Dock, MD, FACS  She will need a follow-up visit in 6 weeks with a duplex of her fistula at that time.  Thank you.

## 2017-07-23 NOTE — Telephone Encounter (Signed)
Sched appt 09/03/17; lab at 2:00 and MD at 2:45. Lm on hm#.

## 2017-07-25 ENCOUNTER — Other Ambulatory Visit: Payer: Self-pay

## 2017-07-25 DIAGNOSIS — Z48812 Encounter for surgical aftercare following surgery on the circulatory system: Secondary | ICD-10-CM

## 2017-07-25 DIAGNOSIS — N185 Chronic kidney disease, stage 5: Secondary | ICD-10-CM

## 2017-07-28 DIAGNOSIS — H401131 Primary open-angle glaucoma, bilateral, mild stage: Secondary | ICD-10-CM | POA: Diagnosis not present

## 2017-07-30 ENCOUNTER — Encounter (HOSPITAL_COMMUNITY)
Admission: RE | Admit: 2017-07-30 | Discharge: 2017-07-30 | Disposition: A | Discharge status: Home / Self Care | Payer: Medicare Other | Source: Ambulatory Visit | Attending: Nephrology | Admitting: Nephrology

## 2017-07-30 VITALS — BP 135/41 | HR 54 | Temp 98.1°F | Resp 16

## 2017-07-30 DIAGNOSIS — N185 Chronic kidney disease, stage 5: Secondary | ICD-10-CM

## 2017-07-30 DIAGNOSIS — N184 Chronic kidney disease, stage 4 (severe): Secondary | ICD-10-CM | POA: Diagnosis not present

## 2017-07-30 DIAGNOSIS — D631 Anemia in chronic kidney disease: Secondary | ICD-10-CM | POA: Diagnosis not present

## 2017-07-30 LAB — IRON AND TIBC
Iron: 56 ug/dL (ref 28–170)
SATURATION RATIOS: 19 % (ref 10.4–31.8)
TIBC: 294 ug/dL (ref 250–450)
UIBC: 238 ug/dL

## 2017-07-30 LAB — POCT HEMOGLOBIN-HEMACUE: Hemoglobin: 9.3 g/dL — ABNORMAL LOW (ref 12.0–15.0)

## 2017-07-30 MED ORDER — EPOETIN ALFA 20000 UNIT/ML IJ SOLN
INTRAMUSCULAR | Status: AC
  Administered 2017-07-30: 20000 [IU] via SUBCUTANEOUS
  Filled 2017-07-30: qty 1

## 2017-07-30 MED ORDER — EPOETIN ALFA 20000 UNIT/ML IJ SOLN
20000.0000 [IU] | INTRAMUSCULAR | Status: DC
  Administered 2017-07-30: 20000 [IU] via SUBCUTANEOUS

## 2017-08-06 ENCOUNTER — Encounter (HOSPITAL_COMMUNITY): Payer: Medicare Other

## 2017-08-13 ENCOUNTER — Encounter (HOSPITAL_COMMUNITY)
Admission: RE | Admit: 2017-08-13 | Discharge: 2017-08-13 | Disposition: A | Discharge status: Home / Self Care | Payer: Medicare Other | Source: Ambulatory Visit | Attending: Nephrology | Admitting: Nephrology

## 2017-08-13 VITALS — BP 142/35 | HR 62 | Temp 98.3°F | Resp 20

## 2017-08-13 DIAGNOSIS — F411 Generalized anxiety disorder: Secondary | ICD-10-CM | POA: Diagnosis not present

## 2017-08-13 DIAGNOSIS — N185 Chronic kidney disease, stage 5: Secondary | ICD-10-CM

## 2017-08-13 DIAGNOSIS — N184 Chronic kidney disease, stage 4 (severe): Secondary | ICD-10-CM | POA: Diagnosis not present

## 2017-08-13 DIAGNOSIS — F329 Major depressive disorder, single episode, unspecified: Secondary | ICD-10-CM | POA: Diagnosis not present

## 2017-08-13 DIAGNOSIS — D631 Anemia in chronic kidney disease: Secondary | ICD-10-CM | POA: Diagnosis not present

## 2017-08-13 MED ORDER — EPOETIN ALFA 20000 UNIT/ML IJ SOLN
20000.0000 [IU] | INTRAMUSCULAR | Status: DC
  Administered 2017-08-13: 20000 [IU] via SUBCUTANEOUS

## 2017-08-13 MED ORDER — EPOETIN ALFA 20000 UNIT/ML IJ SOLN
INTRAMUSCULAR | Status: AC
  Filled 2017-08-13: qty 1

## 2017-08-14 LAB — POCT HEMOGLOBIN-HEMACUE: Hemoglobin: 9 g/dL — ABNORMAL LOW (ref 12.0–15.0)

## 2017-08-19 DIAGNOSIS — H3561 Retinal hemorrhage, right eye: Secondary | ICD-10-CM | POA: Diagnosis not present

## 2017-08-19 DIAGNOSIS — E113491 Type 2 diabetes mellitus with severe nonproliferative diabetic retinopathy without macular edema, right eye: Secondary | ICD-10-CM | POA: Diagnosis not present

## 2017-08-19 DIAGNOSIS — E113592 Type 2 diabetes mellitus with proliferative diabetic retinopathy without macular edema, left eye: Secondary | ICD-10-CM | POA: Diagnosis not present

## 2017-08-19 DIAGNOSIS — H31009 Unspecified chorioretinal scars, unspecified eye: Secondary | ICD-10-CM | POA: Diagnosis not present

## 2017-08-26 ENCOUNTER — Other Ambulatory Visit (HOSPITAL_COMMUNITY): Payer: Self-pay

## 2017-08-27 ENCOUNTER — Encounter (HOSPITAL_COMMUNITY)
Admission: RE | Admit: 2017-08-27 | Discharge: 2017-08-27 | Disposition: A | Discharge status: Home / Self Care | Payer: Medicare Other | Source: Ambulatory Visit | Attending: Nephrology | Admitting: Nephrology

## 2017-08-27 VITALS — BP 105/35 | HR 50 | Temp 98.2°F | Resp 20 | Ht 64.5 in | Wt 187.0 lb

## 2017-08-27 DIAGNOSIS — D631 Anemia in chronic kidney disease: Secondary | ICD-10-CM | POA: Diagnosis not present

## 2017-08-27 DIAGNOSIS — N184 Chronic kidney disease, stage 4 (severe): Secondary | ICD-10-CM | POA: Insufficient documentation

## 2017-08-27 DIAGNOSIS — N185 Chronic kidney disease, stage 5: Secondary | ICD-10-CM

## 2017-08-27 LAB — RENAL FUNCTION PANEL
ALBUMIN: 3 g/dL — AB (ref 3.5–5.0)
ANION GAP: 16 — AB (ref 5–15)
BUN: 95 mg/dL — ABNORMAL HIGH (ref 6–20)
CHLORIDE: 107 mmol/L (ref 101–111)
CO2: 18 mmol/L — ABNORMAL LOW (ref 22–32)
Calcium: 9.1 mg/dL (ref 8.9–10.3)
Creatinine, Ser: 5.7 mg/dL — ABNORMAL HIGH (ref 0.44–1.00)
GFR calc Af Amer: 7 mL/min — ABNORMAL LOW (ref >60)
GFR, EST NON AFRICAN AMERICAN: 6 mL/min — AB (ref >60)
GLUCOSE: 112 mg/dL — AB (ref 65–99)
POTASSIUM: 3.9 mmol/L (ref 3.5–5.1)
Phosphorus: 7.2 mg/dL — ABNORMAL HIGH (ref 2.5–4.6)
Sodium: 141 mmol/L (ref 135–145)

## 2017-08-27 LAB — POCT HEMOGLOBIN-HEMACUE: HEMOGLOBIN: 10.1 g/dL — AB (ref 12.0–15.0)

## 2017-08-27 MED ORDER — SODIUM CHLORIDE 0.9 % IV SOLN
510.0000 mg | INTRAVENOUS | Status: DC
  Administered 2017-08-27: 510 mg via INTRAVENOUS
  Filled 2017-08-27: qty 17

## 2017-08-27 MED ORDER — EPOETIN ALFA 20000 UNIT/ML IJ SOLN
INTRAMUSCULAR | Status: AC
  Administered 2017-08-27: 20000 [IU] via SUBCUTANEOUS
  Filled 2017-08-27: qty 1

## 2017-08-27 MED ORDER — EPOETIN ALFA 20000 UNIT/ML IJ SOLN
20000.0000 [IU] | INTRAMUSCULAR | Status: DC
  Administered 2017-08-27: 20000 [IU] via SUBCUTANEOUS

## 2017-08-31 ENCOUNTER — Other Ambulatory Visit: Payer: Self-pay | Admitting: Interventional Cardiology

## 2017-09-03 ENCOUNTER — Encounter: Payer: Self-pay | Admitting: Vascular Surgery

## 2017-09-03 ENCOUNTER — Ambulatory Visit (INDEPENDENT_AMBULATORY_CARE_PROVIDER_SITE_OTHER): Payer: Medicare Other | Admitting: Vascular Surgery

## 2017-09-03 ENCOUNTER — Ambulatory Visit (HOSPITAL_COMMUNITY)
Admission: RE | Admit: 2017-09-03 | Discharge: 2017-09-03 | Disposition: A | Discharge status: Home / Self Care | Payer: Medicare Other | Source: Ambulatory Visit | Attending: Vascular Surgery | Admitting: Vascular Surgery

## 2017-09-03 VITALS — BP 110/42 | HR 55 | Temp 97.2°F | Resp 18 | Ht 64.5 in | Wt 178.6 lb

## 2017-09-03 DIAGNOSIS — Z4901 Encounter for fitting and adjustment of extracorporeal dialysis catheter: Secondary | ICD-10-CM | POA: Diagnosis not present

## 2017-09-03 DIAGNOSIS — N186 End stage renal disease: Secondary | ICD-10-CM | POA: Diagnosis not present

## 2017-09-03 DIAGNOSIS — Z992 Dependence on renal dialysis: Secondary | ICD-10-CM

## 2017-09-03 DIAGNOSIS — N185 Chronic kidney disease, stage 5: Secondary | ICD-10-CM | POA: Insufficient documentation

## 2017-09-03 DIAGNOSIS — Z48812 Encounter for surgical aftercare following surgery on the circulatory system: Secondary | ICD-10-CM | POA: Diagnosis not present

## 2017-09-03 NOTE — Progress Notes (Signed)
Patient name: VINNIE BOBST MRN: [id number] DOB: [birth year] Sex: female  REASON FOR VISIT:   Follow-up of AV fistula.  HPI:   FIONNUALA HEMMERICH is a pleasant 77 y.o. 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  She presented for a fistulogram.  On 07/21/2017 she underwent a fistulogram with venoplasty of the left brachiocephalic fistula.  She presents for a follow-up visit.    She underwent a fistulogram with very limited contrast.  This showed a tight focal stenosis in the upper arm which was successfully addressed with venoplasty.  She is not on dialysis.  She has had some problems with anemia and recently received iron and she felt much better.  Current Outpatient Medications  Medication Sig Dispense Refill  . acetaminophen (TYLENOL) 325 MG tablet Take 2 tablets (650 mg total) by mouth every 6 (six) hours as needed for mild pain.    Marland Kitchen allopurinol (ZYLOPRIM) 100 MG tablet Take 200 mg by mouth daily.     Marland Kitchen aspirin 325 MG tablet Take 325 mg by mouth daily.    Marland Kitchen atorvastatin (LIPITOR) 10 MG tablet TAKE ONE TABLET BY MOUTH ONE TIME DAILY 30 tablet 0  . B Complex Vitamins (VITAMIN B COMPLEX PO) Take 1 tablet by mouth daily.    . brimonidine (ALPHAGAN P) 0.1 % SOLN Place 1 drop into the left eye 2 (two) times daily.    . calcitRIOL (ROCALTROL) 0.25 MCG capsule Take 0.25 mcg by mouth daily.     . Cholecalciferol (VITAMIN D) 2000 UNITS tablet Take 2,000 Units by mouth 2 (two) times daily.    . citalopram (CELEXA) 20 MG tablet Take 20 mg by mouth daily.    . Cyanocobalamin (B-12) 5000 MCG CAPS Take 5,000 mcg by mouth daily.    . furosemide (LASIX) 40 MG tablet Take 80 mg by mouth 2 (two) times daily.    . insulin glargine (LANTUS) 100 UNIT/ML injection Inject 0.2-0.3 mLs (20-30 Units total) into the skin at bedtime. (Patient taking differently: Inject 10-30 Units into the skin at bedtime. May do an additional 10 units in the morning as needed for high  blood sugar) 10 mL 11  . isosorbide mononitrate (IMDUR) 30 MG 24 hr tablet Take 1 tablet (30 mg total) by mouth daily. 30 tablet 0  . Menthol-Methyl Salicylate (MUSCLE RUB) 10-15 % CREA Apply 1 application topically 2 (two) times daily. (Patient taking differently: Apply 1 application topically as needed for muscle pain. )  0  . metoprolol tartrate (LOPRESSOR) 25 MG tablet Take 1 tablet (25 mg total) by mouth 2 (two) times daily. Please keep upcoming appt for future refills. Thank you 180 tablet 0  . nitroGLYCERIN (NITROSTAT) 0.4 MG SL tablet Place 0.4 mg under the tongue every 5 (five) minutes as needed. Chest pain    . Omega-3 Fatty Acids (FISH OIL) 1200 MG CAPS Take 1,200 mg by mouth 2 (two) times daily.    Marland Kitchen omeprazole (PRILOSEC) 20 MG capsule Take 20 mg by mouth daily as needed (acid reflux).     . prazosin (MINIPRESS) 1 MG capsule TAKE 1 CAPSULE (1 MG TOTAL) BY MOUTH AT BEDTIME. 30 capsule 1  . sitaGLIPtin (JANUVIA) 25 MG tablet Take 25 mg by mouth daily.     . timolol (TIMOPTIC) 0.5 % ophthalmic solution Place 1 drop into the left eye daily.    . travoprost, benzalkonium, (TRAVATAN) 0.004 % ophthalmic solution Place 1 drop into both eyes  at bedtime.      No current facility-administered medications for this visit.     REVIEW OF SYSTEMS:  [X]  denotes positive finding, [ ]  denotes negative finding Cardiac  Comments:  Chest pain or chest pressure:    Shortness of breath upon exertion:    Short of breath when lying flat:    Irregular heart rhythm:    Constitutional    Fever or chills:     PHYSICAL EXAM:   Vitals:   09/03/17 1500  BP: (!) 110/42  Pulse: (!) 55  Resp: 18  Temp: (!) 97.2 F (36.2 C)  TempSrc: Oral  SpO2: 98%  Weight: 178 lb 9.6 oz (81 kg)  Height: 5' 4.5" (1.638 m)    GENERAL: The patient is a well-nourished female, in no acute distress. The vital signs are documented above. CARDIOVASCULAR: There is a regular rate and rhythm. PULMONARY: There is good air  exchange bilaterally without wheezing or rales. Her fistula has a good thrill and appears to be adequate for cannulation if needed.  DATA:   DUPLEX LEFT BRACHIOCEPHALIC AV FISTULA: I have been apparently interpreted the duplex of her left brachiocephalic AV fistula.  The diameters of the vein range from 0.6-0.9 cm.  She has an elevated velocities centrally.  MEDICAL ISSUES:   STATUS POST LEFT BRACHIOCEPHALIC AV FISTULA: She is undergone successful venoplasty of a tight stenosis within her left brachiocephalic AV fistula.  Based on her duplex the fistula appears to be adequate in size to be used for dialysis if needed.  Currently she is not on dialysis.  She does have some mildly elevated velocities in the upper arm but this does not appear to be an issue at this point.  If this progresses in the future she could potentially require repeat fistulogram and venoplasty.  [name] [facility] [phone]

## 2017-09-10 ENCOUNTER — Ambulatory Visit (HOSPITAL_COMMUNITY)
Admission: RE | Admit: 2017-09-10 | Discharge: 2017-09-10 | Disposition: A | Discharge status: Home / Self Care | Payer: Medicare Other | Source: Ambulatory Visit | Attending: Nephrology | Admitting: Nephrology

## 2017-09-10 VITALS — BP 134/42 | HR 54 | Temp 98.6°F | Resp 20

## 2017-09-10 DIAGNOSIS — D631 Anemia in chronic kidney disease: Secondary | ICD-10-CM | POA: Diagnosis not present

## 2017-09-10 DIAGNOSIS — N185 Chronic kidney disease, stage 5: Secondary | ICD-10-CM

## 2017-09-10 LAB — POCT HEMOGLOBIN-HEMACUE: HEMOGLOBIN: 10.4 g/dL — AB (ref 12.0–15.0)

## 2017-09-10 MED ORDER — EPOETIN ALFA 20000 UNIT/ML IJ SOLN
20000.0000 [IU] | INTRAMUSCULAR | Status: DC
  Administered 2017-09-10: 20000 [IU] via SUBCUTANEOUS

## 2017-09-10 MED ORDER — FERUMOXYTOL INJECTION 510 MG/17 ML
510.0000 mg | INTRAVENOUS | Status: DC
Start: 2017-09-10 — End: 2017-09-11
  Administered 2017-09-10: 11:00:00 510 mg via INTRAVENOUS
  Filled 2017-09-10: qty 17

## 2017-09-10 MED ORDER — EPOETIN ALFA 20000 UNIT/ML IJ SOLN
INTRAMUSCULAR | Status: AC
  Filled 2017-09-10: qty 1

## 2017-09-11 DIAGNOSIS — F329 Major depressive disorder, single episode, unspecified: Secondary | ICD-10-CM | POA: Diagnosis not present

## 2017-09-11 DIAGNOSIS — F411 Generalized anxiety disorder: Secondary | ICD-10-CM | POA: Diagnosis not present

## 2017-09-23 NOTE — Progress Notes (Signed)
Cardiology Office Note   Date:  09/25/2017   ID:  Debra, Espinoza 1941/07/14, MRN [id number]  PCP:  Josetta Huddle, MD    No chief complaint on file.  CAD  Wt Readings from Last 3 Encounters:  09/25/17 178 lb 6.4 oz (80.9 kg)  09/03/17 178 lb 9.6 oz (81 kg)  08/27/17 187 lb (84.8 kg)       History of Present Illness: [name] is a 77 y.o. female  with a history of coronary artery disease. She also has renal insufficiency. She had an episode of right-sided weakness and confusion in December 2016. This was concerning for a TIA. She has a history of postoperative atrial fibrillation after bypass surgery several years ago. The concern was then made that perhaps she had paroxysmal atrial fibrillation, and embolism and her TIA symptoms. Her neurologic symptoms have improved significantly.    A monitor was placed in early 2017. Preliminary results show no evidence of atrial fibrillation. The thought was that if she did have evidence of atrial fibrillation, she would require anticoagulation, likely with Coumadin due to her renal insufficiency. In the interim, she has only been on aspirin.  She had a fall while in [place] in 7/18.  She had some type of ICH.  She was at [facility].    Her balance has been a problem.  SHe is walking with a walker now.  She was anemic and this improved with iron IV.  Hbg increased to > 11, from 9.1.    She has a fistula placed in 2015.  This was revised in 2019.    Denies : Chest pain. Dizziness. Leg edema. Nitroglycerin use. Orthopnea. Palpitations. Paroxysmal nocturnal dyspnea. Shortness of breath. Syncope.   No recent falls.        Past Medical History:  Diagnosis Date  . Anemia   . Arthritis   . Atrial fibrillation ([place])   . Blood transfusion   . CAD (coronary artery disease)   . Cancer ([place])    .  top of head- melonoma  . Carotid artery occlusion    Carotid Endartectom,y - left 2009.  Blockage Right being watched by Dr  Scot Dock.  . Carotid stenosis   . 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 Complication of anesthesia    pt. states that she was difficult to wake  . Depression   . Diabetes mellitus without complication ([place])   . Dysrhythmia   . General weakness 12/2015  . GERD (gastroesophageal reflux disease)   . Glaucoma   . History of hiatal hernia   . History of kidney stones    passed  . History of pneumonia   . HOH (hard of hearing)   . Hypertension   . Hypokalemia 12/2015  . Myocardial infarction ([place])   . Neuromuscular disorder ([place])    CARPEL TUNNEL  . Pneumonia   . Shortness of breath   . Stroke ([place])    hx of TIA    Past Surgical History:  Procedure Laterality Date  . A/V FISTULAGRAM N/A 07/21/2017   Procedure: A/V FISTULAGRAM - Left Arm;  Surgeon: Angelia Mould, MD;  Location: [place] [facility];  Service: Cardiovascular;  Laterality: N/A;  . AV FISTULA PLACEMENT Left 05/31/2014   Procedure: Creation of Left Arm arteriovenous brachiocephalic Fistula;  Surgeon: Angelia Mould, MD;  Location: [place];  Service: Vascular;  Laterality: Left;  . AV FISTULA PLACEMENT Left 09/01/2014  Procedure: INSERTION OF ARTERIOVENOUS (AV) GORE-TEX GRAFT LEFT UPPER ARM USING  4-7 MM X 45 CM SRTETCH GORETEX GRAFT;  Surgeon: Angelia Mould, MD;  Location: [place];  Service: Vascular;  Laterality: Left;  . CARDIAC CATHETERIZATION    . CAROTID ENDARTERECTOMY Left 2009    CEA  . carpel tunnel    . COLONOSCOPY  07/25/2011   Procedure: COLONOSCOPY;  Surgeon: Winfield Cunas., MD;  Location: [facility] ENDOSCOPY;  Service: Endoscopy;  Laterality: N/A;  . CORONARY ARTERY BYPASS GRAFT  07/29/2011   Procedure: CORONARY ARTERY BYPASS GRAFTING (CABG);  Surgeon: Gaye Pollack, MD;  Location: [place];  Service: Open Heart Surgery;  Laterality: N/A;  . ENDARTERECTOMY Right 04/21/2015   Procedure: ENDARTERECTOMY CAROTID;  Surgeon: Angelia Mould, MD;  Location: [place];  Service: Vascular;   Laterality: Right;  . ESOPHAGOGASTRODUODENOSCOPY  07/25/2011   Procedure: ESOPHAGOGASTRODUODENOSCOPY (EGD);  Surgeon: Winfield Cunas., MD;  Location[facility] ENDOSCOPY;  Service: Endoscopy;  Laterality: N/A;  . EYE SURGERY    . FISTULOGRAM Left 08/29/2014   Procedure: FISTULOGRAM;  Surgeon: Angelia Mould, MD;  Location: [facility] CATH LAB;  Service: Cardiovascular;  Laterality: Left;  . KNEE SURGERY Left   . LUMBAR LAMINECTOMY/DECOMPRESSION MICRODISCECTOMY N/A 12/01/2015   Procedure: L5-S1 Decompression and Bilateral Microdiscectomy;  Surgeon: Marybelle Killings, MD;  Location: [place];  Service: Orthopedics;  Laterality: N/A;  . NECK SURGERY    . PERIPHERAL VASCULAR BALLOON ANGIOPLASTY  07/21/2017   Procedure: PERIPHERAL VASCULAR BALLOON ANGIOPLASTY;  Surgeon: Angelia Mould, MD;  Location: [place] [facility];  Service: Cardiovascular;;  LT Arm AVF  . TONSILLECTOMY       Current Outpatient Medications  Medication Sig Dispense Refill  . acetaminophen (TYLENOL) 325 MG tablet Take 2 tablets (650 mg total) by mouth every 6 (six) hours as needed for mild pain.    Marland Kitchen allopurinol (ZYLOPRIM) 100 MG tablet Take 200 mg by mouth daily.     Marland Kitchen aspirin 325 MG tablet Take 325 mg by mouth daily.    Marland Kitchen atorvastatin (LIPITOR) 10 MG tablet TAKE ONE TABLET BY MOUTH ONE TIME DAILY 30 tablet 0  . B Complex Vitamins (VITAMIN B COMPLEX PO) Take 1 tablet by mouth daily.    . brimonidine (ALPHAGAN P) 0.1 % SOLN Place 1 drop into the left eye 2 (two) times daily.    . calcitRIOL (ROCALTROL) 0.25 MCG capsule Take 0.25 mcg by mouth daily.     . Cholecalciferol (VITAMIN D) 2000 UNITS tablet Take 2,000 Units by mouth 2 (two) times daily.    . citalopram (CELEXA) 20 MG tablet Take 20 mg by mouth daily.    . Cyanocobalamin (B-12) 5000 MCG CAPS Take 5,000 mcg by mouth daily.    . furosemide (LASIX) 40 MG tablet Take 80 mg by mouth 2 (two) times daily.    . insulin glargine (LANTUS) 100 UNIT/ML injection Inject 0.2-0.3 mLs  (20-30 Units total) into the skin at bedtime. (Patient taking differently: Inject 10-30 Units into the skin at bedtime. May do an additional 10 units in the morning as needed for high blood sugar) 10 mL 11  . isosorbide mononitrate (IMDUR) 30 MG 24 hr tablet Take 1 tablet (30 mg total) by mouth daily. 30 tablet 0  . metoprolol tartrate (LOPRESSOR) 25 MG tablet Take 1 tablet (25 mg total) by mouth 2 (two) times daily. Please keep upcoming appt for future refills. Thank you 180 tablet 0  . nitroGLYCERIN (NITROSTAT)  0.4 MG SL tablet Place 0.4 mg under the tongue every 5 (five) minutes as needed. Chest pain    . Omega-3 Fatty Acids (FISH OIL) 1200 MG CAPS Take 1,200 mg by mouth 2 (two) times daily.    Marland Kitchen omeprazole (PRILOSEC) 20 MG capsule Take 20 mg by mouth daily as needed (acid reflux).     . prazosin (MINIPRESS) 1 MG capsule TAKE 1 CAPSULE (1 MG TOTAL) BY MOUTH AT BEDTIME. 30 capsule 1  . sitaGLIPtin (JANUVIA) 25 MG tablet Take 25 mg by mouth daily.     . timolol (TIMOPTIC) 0.5 % ophthalmic solution Place 1 drop into the left eye daily.    . travoprost, benzalkonium, (TRAVATAN) 0.004 % ophthalmic solution Place 1 drop into both eyes at bedtime.     . colchicine 0.6 MG tablet Take 0.6 mg by mouth 2 (two) times daily.    Marland Kitchen epoetin alfa (EPOGEN,PROCRIT) 29518 UNIT/ML injection Inject 20,000 Units into the skin every 21 ( twenty-one) days.    . Menthol-Methyl Salicylate (MUSCLE RUB) 10-15 % CREA Apply 1 application topically 2 (two) times daily. (Patient not taking: Reported on 09/25/2017)  0   No current facility-administered medications for this visit.     Allergies:   Gabapentin; Sulfa antibiotics; and Amlodipine    Social History:  The patient  reports that  has never smoked. she has never used smokeless tobacco. She reports that she does not drink alcohol or use drugs.   Family History:  The patient's family history includes Cancer in her father; Deep vein thrombosis in her daughter; Diabetes in  her daughter and mother; Heart attack in her mother; Heart disease in her daughter and mother; Hyperlipidemia in her daughter and father; Hypertension in her daughter, father, and mother; Peripheral vascular disease in her daughter; Stroke in her mother.    ROS:  Please see the history of present illness.   Otherwise, review of systems are positive for .   All other systems are reviewed and negative.    PHYSICAL EXAM: VS:  BP 118/62   Pulse 62   Ht 5' 4.5" (1.638 m)   Wt 178 lb 6.4 oz (80.9 kg)   SpO2 98%   BMI 30.15 kg/m  , BMI Body mass index is 30.15 kg/m. GEN: Well nourished, well developed, in no acute distress  HEENT: normal  Neck: no JVD, carotid bruits, or masses Cardiac: RRR; no murmurs, rubs, or gallops,no edema  Respiratory:  clear to auscultation bilaterally, normal work of breathing GI: soft, nontender, nondistended, + BS MS: no deformity or atrophy ; AV fistual on left arm Skin: warm and dry, no rash Neuro:  Strength and sensation are intact Psych: euthymic mood, full affect   EKG:   The ekg ordered today demonstrates NSR, bifascicular block; LAFB, RBBB, persistent inferolateral ST changes- unchanged   Recent Labs: 09/24/2017: BUN 74; Creatinine, Ser 5.03; Hemoglobin 11.3; Potassium 3.6; Sodium 142   Lipid Panel    Component Value Date/Time   CHOL 177 07/13/2015 1630   TRIG 280 (H) 07/13/2015 1630   HDL 40 (L) 07/13/2015 1630   CHOLHDL 4.4 07/13/2015 1630   VLDL 56 (H) 07/13/2015 1630   LDLCALC 81 07/13/2015 1630     Other studies Reviewed: Additional studies/ records that were reviewed today with results demonstrating: labs from yesterday reviewed; Hbg 11.3.   ASSESSMENT AND PLAN:  1. CAD: No angina on medical therapy.  Continue aggressive secondary prevention.  From a cardiac perspective, could reduce aspirin dose  to 81 mg daily.  Would request primary care doctor or nephrologist to check LFTs at the next blood draw. 2. PAF: No sx of AFib.  No  anticoagulation.  ICH post fall in 2018.  Currently, she is on high-dose aspirin.  From a cardiac perspective, she only needs to be on 81 mg aspirin.  There may be another reason, such as her AV fistula or some neurologic reason. 3. Hyperlipidemia: LDL 44. COntinue atorvastatin.  LFTs need to be checked.  4. CRI: Still avoiding dialysis.  Stage 4 CKD.  Has AV fistula in place. 5. HTN: The current medical regimen is effective;  continue present plan and medications.    Current medicines are reviewed at length with the patient today.  The patient concerns regarding her medicines were addressed.  The following changes have been made:  No change  Labs/ tests ordered today include:  No orders of the defined types were placed in this encounter.   Recommend 150 minutes/week of aerobic exercise Low fat, low carb, high fiber diet recommended  Disposition:   FU in 1 year   Signed, Larae Grooms, MD  09/25/2017 3:51 PM    [facility] [place], [place], [place]  [address] Phone: [phone]; Fax: [phone]

## 2017-09-24 ENCOUNTER — Encounter (HOSPITAL_COMMUNITY)
Admission: RE | Admit: 2017-09-24 | Discharge: 2017-09-24 | Disposition: A | Discharge status: Home / Self Care | Payer: Medicare Other | Source: Ambulatory Visit | Attending: Nephrology | Admitting: Nephrology

## 2017-09-24 ENCOUNTER — Encounter (HOSPITAL_COMMUNITY): Payer: Medicare Other

## 2017-09-24 VITALS — BP 127/39 | HR 55 | Temp 98.0°F | Resp 20

## 2017-09-24 DIAGNOSIS — E782 Mixed hyperlipidemia: Secondary | ICD-10-CM | POA: Diagnosis not present

## 2017-09-24 DIAGNOSIS — N184 Chronic kidney disease, stage 4 (severe): Secondary | ICD-10-CM | POA: Diagnosis not present

## 2017-09-24 DIAGNOSIS — E0840 Diabetes mellitus due to underlying condition with diabetic neuropathy, unspecified: Secondary | ICD-10-CM | POA: Diagnosis not present

## 2017-09-24 DIAGNOSIS — N185 Chronic kidney disease, stage 5: Secondary | ICD-10-CM

## 2017-09-24 DIAGNOSIS — E559 Vitamin D deficiency, unspecified: Secondary | ICD-10-CM | POA: Diagnosis not present

## 2017-09-24 DIAGNOSIS — E113293 Type 2 diabetes mellitus with mild nonproliferative diabetic retinopathy without macular edema, bilateral: Secondary | ICD-10-CM | POA: Diagnosis not present

## 2017-09-24 DIAGNOSIS — I10 Essential (primary) hypertension: Secondary | ICD-10-CM | POA: Diagnosis not present

## 2017-09-24 DIAGNOSIS — D81818 Other biotin-dependent carboxylase deficiency: Secondary | ICD-10-CM | POA: Diagnosis not present

## 2017-09-24 DIAGNOSIS — K219 Gastro-esophageal reflux disease without esophagitis: Secondary | ICD-10-CM | POA: Diagnosis not present

## 2017-09-24 DIAGNOSIS — D631 Anemia in chronic kidney disease: Secondary | ICD-10-CM | POA: Insufficient documentation

## 2017-09-24 DIAGNOSIS — E1121 Type 2 diabetes mellitus with diabetic nephropathy: Secondary | ICD-10-CM | POA: Diagnosis not present

## 2017-09-24 DIAGNOSIS — I25111 Atherosclerotic heart disease of native coronary artery with angina pectoris with documented spasm: Secondary | ICD-10-CM | POA: Diagnosis not present

## 2017-09-24 DIAGNOSIS — M545 Low back pain: Secondary | ICD-10-CM | POA: Diagnosis not present

## 2017-09-24 LAB — RENAL FUNCTION PANEL
Albumin: 3 g/dL — ABNORMAL LOW (ref 3.5–5.0)
Anion gap: 12 (ref 5–15)
BUN: 74 mg/dL — AB (ref 6–20)
CHLORIDE: 109 mmol/L (ref 101–111)
CO2: 21 mmol/L — AB (ref 22–32)
CREATININE: 5.03 mg/dL — AB (ref 0.44–1.00)
Calcium: 9 mg/dL (ref 8.9–10.3)
GFR calc Af Amer: 9 mL/min — ABNORMAL LOW (ref >60)
GFR calc non Af Amer: 8 mL/min — ABNORMAL LOW (ref >60)
GLUCOSE: 111 mg/dL — AB (ref 65–99)
Phosphorus: 5.8 mg/dL — ABNORMAL HIGH (ref 2.5–4.6)
Potassium: 3.6 mmol/L (ref 3.5–5.1)
Sodium: 142 mmol/L (ref 135–145)

## 2017-09-24 LAB — IRON AND TIBC
IRON: 79 ug/dL (ref 28–170)
SATURATION RATIOS: 33 % — AB (ref 10.4–31.8)
TIBC: 241 ug/dL — ABNORMAL LOW (ref 250–450)
UIBC: 162 ug/dL

## 2017-09-24 LAB — FERRITIN: FERRITIN: 779 ng/mL — AB (ref 11–307)

## 2017-09-24 LAB — POCT HEMOGLOBIN-HEMACUE: Hemoglobin: 11.3 g/dL — ABNORMAL LOW (ref 12.0–15.0)

## 2017-09-24 MED ORDER — EPOETIN ALFA 20000 UNIT/ML IJ SOLN
20000.0000 [IU] | INTRAMUSCULAR | Status: DC
  Administered 2017-09-24: 20000 [IU] via SUBCUTANEOUS

## 2017-09-24 MED ORDER — EPOETIN ALFA 20000 UNIT/ML IJ SOLN
INTRAMUSCULAR | Status: AC
  Administered 2017-09-24: 13:00:00 20000 [IU] via SUBCUTANEOUS
  Filled 2017-09-24: qty 1

## 2017-09-25 ENCOUNTER — Encounter: Payer: Self-pay | Admitting: Interventional Cardiology

## 2017-09-25 ENCOUNTER — Ambulatory Visit (INDEPENDENT_AMBULATORY_CARE_PROVIDER_SITE_OTHER): Payer: Medicare Other | Admitting: Interventional Cardiology

## 2017-09-25 VITALS — BP 118/62 | HR 62 | Ht 64.5 in | Wt 178.4 lb

## 2017-09-25 DIAGNOSIS — I2510 Atherosclerotic heart disease of native coronary artery without angina pectoris: Secondary | ICD-10-CM | POA: Diagnosis not present

## 2017-09-25 DIAGNOSIS — I10 Essential (primary) hypertension: Secondary | ICD-10-CM | POA: Diagnosis not present

## 2017-09-25 DIAGNOSIS — N184 Chronic kidney disease, stage 4 (severe): Secondary | ICD-10-CM

## 2017-09-25 DIAGNOSIS — E782 Mixed hyperlipidemia: Secondary | ICD-10-CM

## 2017-09-25 DIAGNOSIS — I480 Paroxysmal atrial fibrillation: Secondary | ICD-10-CM

## 2017-09-25 LAB — PTH, INTACT AND CALCIUM
Calcium, Total (PTH): 8.9 mg/dL (ref 8.7–10.3)
PTH: 46 pg/mL (ref 15–65)

## 2017-09-25 NOTE — Patient Instructions (Signed)

## 2017-09-29 DIAGNOSIS — N2581 Secondary hyperparathyroidism of renal origin: Secondary | ICD-10-CM | POA: Diagnosis not present

## 2017-09-29 DIAGNOSIS — D631 Anemia in chronic kidney disease: Secondary | ICD-10-CM | POA: Diagnosis not present

## 2017-09-29 DIAGNOSIS — I120 Hypertensive chronic kidney disease with stage 5 chronic kidney disease or end stage renal disease: Secondary | ICD-10-CM | POA: Diagnosis not present

## 2017-09-29 DIAGNOSIS — N185 Chronic kidney disease, stage 5: Secondary | ICD-10-CM | POA: Diagnosis not present

## 2017-09-30 ENCOUNTER — Telehealth: Payer: Self-pay

## 2017-09-30 MED ORDER — ASPIRIN EC 81 MG PO TBEC: 81.0000 mg | DELAYED_RELEASE_TABLET | Freq: Every day | ORAL | 3 refills | Status: DC

## 2017-09-30 NOTE — Telephone Encounter (Signed)
-----   Message from Jettie Booze, MD sent at 09/30/2017 10:33 AM EDT ----- OK to decrease aspirin to 81 mg daily.  JV ----- Message ----- From: Rexene Agent, MD Sent: 09/26/2017   3:08 PM To: Jettie Booze, MD  Yes, no disagreement. RS ----- Message ----- From: Jettie Booze, MD Sent: 09/25/2017   4:43 PM To: Rexene Agent, MD  Dr. Erlinda Hong and Dr. Joelyn Oms, Can aspirin dose be reduced to 81 mg daily in this patient.  THis is ok from a cardiac standpoint.  Also, she needs LFTs checked with next blood draw.  Thanks.  JV

## 2017-09-30 NOTE — Telephone Encounter (Signed)
Called and instructed patient to decrease ASA to 81 mg QD. Patient verbalized understanding and thanked me for the call.

## 2017-10-08 ENCOUNTER — Encounter (HOSPITAL_COMMUNITY)
Admission: RE | Admit: 2017-10-08 | Discharge: 2017-10-08 | Disposition: A | Discharge status: Home / Self Care | Payer: Medicare Other | Source: Ambulatory Visit | Attending: Nephrology | Admitting: Nephrology

## 2017-10-08 VITALS — BP 116/35 | HR 60 | Resp 18

## 2017-10-08 DIAGNOSIS — D631 Anemia in chronic kidney disease: Secondary | ICD-10-CM | POA: Diagnosis not present

## 2017-10-08 DIAGNOSIS — N184 Chronic kidney disease, stage 4 (severe): Secondary | ICD-10-CM | POA: Diagnosis not present

## 2017-10-08 DIAGNOSIS — N185 Chronic kidney disease, stage 5: Secondary | ICD-10-CM

## 2017-10-08 LAB — POCT HEMOGLOBIN-HEMACUE: Hemoglobin: 11.8 g/dL — ABNORMAL LOW (ref 12.0–15.0)

## 2017-10-08 MED ORDER — EPOETIN ALFA 20000 UNIT/ML IJ SOLN
INTRAMUSCULAR | Status: AC
  Administered 2017-10-08: 20000 [IU] via SUBCUTANEOUS
  Filled 2017-10-08: qty 1

## 2017-10-08 MED ORDER — EPOETIN ALFA 20000 UNIT/ML IJ SOLN
20000.0000 [IU] | INTRAMUSCULAR | Status: DC
  Administered 2017-10-08: 20000 [IU] via SUBCUTANEOUS

## 2017-10-22 ENCOUNTER — Encounter (HOSPITAL_COMMUNITY)
Admission: RE | Admit: 2017-10-22 | Discharge: 2017-10-22 | Disposition: A | Discharge status: Home / Self Care | Payer: Medicare Other | Source: Ambulatory Visit | Attending: Nephrology | Admitting: Nephrology

## 2017-10-22 VITALS — BP 139/46 | HR 60 | Temp 97.7°F | Resp 16

## 2017-10-22 DIAGNOSIS — N185 Chronic kidney disease, stage 5: Secondary | ICD-10-CM

## 2017-10-22 DIAGNOSIS — D631 Anemia in chronic kidney disease: Secondary | ICD-10-CM | POA: Diagnosis not present

## 2017-10-22 DIAGNOSIS — N184 Chronic kidney disease, stage 4 (severe): Secondary | ICD-10-CM | POA: Insufficient documentation

## 2017-10-22 LAB — RENAL FUNCTION PANEL
Albumin: 3 g/dL — ABNORMAL LOW (ref 3.5–5.0)
Anion gap: 17 — ABNORMAL HIGH (ref 5–15)
BUN: 85 mg/dL — ABNORMAL HIGH (ref 6–20)
CALCIUM: 9.4 mg/dL (ref 8.9–10.3)
CO2: 20 mmol/L — AB (ref 22–32)
CREATININE: 5.5 mg/dL — AB (ref 0.44–1.00)
Chloride: 104 mmol/L (ref 101–111)
GFR calc Af Amer: 8 mL/min — ABNORMAL LOW (ref >60)
GFR calc non Af Amer: 7 mL/min — ABNORMAL LOW (ref >60)
GLUCOSE: 150 mg/dL — AB (ref 65–99)
Phosphorus: 6.3 mg/dL — ABNORMAL HIGH (ref 2.5–4.6)
Potassium: 3.6 mmol/L (ref 3.5–5.1)
SODIUM: 141 mmol/L (ref 135–145)

## 2017-10-22 LAB — IRON AND TIBC
IRON: 70 ug/dL (ref 28–170)
Saturation Ratios: 28 % (ref 10.4–31.8)
TIBC: 246 ug/dL — AB (ref 250–450)
UIBC: 176 ug/dL

## 2017-10-22 MED ORDER — EPOETIN ALFA 20000 UNIT/ML IJ SOLN
INTRAMUSCULAR | Status: AC
  Filled 2017-10-22: qty 1

## 2017-10-22 MED ORDER — EPOETIN ALFA 20000 UNIT/ML IJ SOLN
20000.0000 [IU] | INTRAMUSCULAR | Status: DC
  Administered 2017-10-22: 20000 [IU] via SUBCUTANEOUS

## 2017-10-24 LAB — POCT HEMOGLOBIN-HEMACUE: Hemoglobin: 11.3 g/dL — ABNORMAL LOW (ref 12.0–15.0)

## 2017-11-05 ENCOUNTER — Encounter (HOSPITAL_COMMUNITY)
Admission: RE | Admit: 2017-11-05 | Discharge: 2017-11-05 | Disposition: A | Discharge status: Home / Self Care | Payer: Medicare Other | Source: Ambulatory Visit | Attending: Nephrology | Admitting: Nephrology

## 2017-11-05 VITALS — BP 133/45 | HR 51 | Temp 98.2°F | Resp 20

## 2017-11-05 DIAGNOSIS — N184 Chronic kidney disease, stage 4 (severe): Secondary | ICD-10-CM | POA: Diagnosis not present

## 2017-11-05 DIAGNOSIS — D631 Anemia in chronic kidney disease: Secondary | ICD-10-CM | POA: Diagnosis not present

## 2017-11-05 DIAGNOSIS — N185 Chronic kidney disease, stage 5: Secondary | ICD-10-CM

## 2017-11-05 LAB — POCT HEMOGLOBIN-HEMACUE: Hemoglobin: 11.1 g/dL — ABNORMAL LOW (ref 12.0–15.0)

## 2017-11-05 MED ORDER — EPOETIN ALFA 20000 UNIT/ML IJ SOLN
INTRAMUSCULAR | Status: AC
  Filled 2017-11-05: qty 1

## 2017-11-05 MED ORDER — EPOETIN ALFA 20000 UNIT/ML IJ SOLN
20000.0000 [IU] | INTRAMUSCULAR | Status: DC
  Administered 2017-11-05: 11:00:00 20000 [IU] via SUBCUTANEOUS

## 2017-11-19 ENCOUNTER — Ambulatory Visit (HOSPITAL_COMMUNITY)
Admission: RE | Admit: 2017-11-19 | Discharge: 2017-11-19 | Disposition: A | Discharge status: Home / Self Care | Payer: Medicare Other | Source: Ambulatory Visit | Attending: Nephrology | Admitting: Nephrology

## 2017-11-19 VITALS — BP 151/41 | HR 53 | Temp 98.3°F | Resp 20

## 2017-11-19 DIAGNOSIS — N184 Chronic kidney disease, stage 4 (severe): Secondary | ICD-10-CM | POA: Insufficient documentation

## 2017-11-19 DIAGNOSIS — H401131 Primary open-angle glaucoma, bilateral, mild stage: Secondary | ICD-10-CM | POA: Diagnosis not present

## 2017-11-19 DIAGNOSIS — D631 Anemia in chronic kidney disease: Secondary | ICD-10-CM | POA: Insufficient documentation

## 2017-11-19 DIAGNOSIS — N185 Chronic kidney disease, stage 5: Secondary | ICD-10-CM

## 2017-11-19 LAB — RENAL FUNCTION PANEL
Albumin: 2.8 g/dL — ABNORMAL LOW (ref 3.5–5.0)
Anion gap: 11 (ref 5–15)
BUN: 76 mg/dL — ABNORMAL HIGH (ref 6–20)
CHLORIDE: 108 mmol/L (ref 101–111)
CO2: 24 mmol/L (ref 22–32)
CREATININE: 4.9 mg/dL — AB (ref 0.44–1.00)
Calcium: 8.7 mg/dL — ABNORMAL LOW (ref 8.9–10.3)
GFR calc Af Amer: 9 mL/min — ABNORMAL LOW (ref >60)
GFR, EST NON AFRICAN AMERICAN: 8 mL/min — AB (ref >60)
Glucose, Bld: 137 mg/dL — ABNORMAL HIGH (ref 65–99)
POTASSIUM: 4.3 mmol/L (ref 3.5–5.1)
Phosphorus: 5.8 mg/dL — ABNORMAL HIGH (ref 2.5–4.6)
Sodium: 143 mmol/L (ref 135–145)

## 2017-11-19 LAB — IRON AND TIBC
IRON: 68 ug/dL (ref 28–170)
Saturation Ratios: 27 % (ref 10.4–31.8)
TIBC: 255 ug/dL (ref 250–450)
UIBC: 187 ug/dL

## 2017-11-19 LAB — POCT HEMOGLOBIN-HEMACUE: HEMOGLOBIN: 11 g/dL — AB (ref 12.0–15.0)

## 2017-11-19 MED ORDER — EPOETIN ALFA 20000 UNIT/ML IJ SOLN
INTRAMUSCULAR | Status: AC
  Filled 2017-11-19: qty 1

## 2017-11-19 MED ORDER — EPOETIN ALFA 20000 UNIT/ML IJ SOLN
20000.0000 [IU] | INTRAMUSCULAR | Status: DC
  Administered 2017-11-19: 20000 [IU] via SUBCUTANEOUS

## 2017-11-26 DIAGNOSIS — L570 Actinic keratosis: Secondary | ICD-10-CM | POA: Diagnosis not present

## 2017-11-26 DIAGNOSIS — L905 Scar conditions and fibrosis of skin: Secondary | ICD-10-CM | POA: Diagnosis not present

## 2017-11-26 DIAGNOSIS — Z85828 Personal history of other malignant neoplasm of skin: Secondary | ICD-10-CM | POA: Diagnosis not present

## 2017-12-03 ENCOUNTER — Ambulatory Visit (HOSPITAL_COMMUNITY)
Admission: RE | Admit: 2017-12-03 | Discharge: 2017-12-03 | Disposition: A | Discharge status: Home / Self Care | Payer: Medicare Other | Source: Ambulatory Visit | Attending: Nephrology | Admitting: Nephrology

## 2017-12-03 VITALS — BP 129/43 | HR 55 | Resp 20

## 2017-12-03 DIAGNOSIS — D631 Anemia in chronic kidney disease: Secondary | ICD-10-CM | POA: Insufficient documentation

## 2017-12-03 DIAGNOSIS — N184 Chronic kidney disease, stage 4 (severe): Secondary | ICD-10-CM | POA: Insufficient documentation

## 2017-12-03 DIAGNOSIS — N185 Chronic kidney disease, stage 5: Secondary | ICD-10-CM

## 2017-12-03 LAB — FERRITIN: FERRITIN: 669 ng/mL — AB (ref 11–307)

## 2017-12-03 LAB — IRON AND TIBC
IRON: 78 ug/dL (ref 28–170)
Saturation Ratios: 29 % (ref 10.4–31.8)
TIBC: 272 ug/dL (ref 250–450)
UIBC: 194 ug/dL

## 2017-12-03 LAB — POCT HEMOGLOBIN-HEMACUE: Hemoglobin: 10.9 g/dL — ABNORMAL LOW (ref 12.0–15.0)

## 2017-12-03 MED ORDER — EPOETIN ALFA 20000 UNIT/ML IJ SOLN
INTRAMUSCULAR | Status: AC
  Filled 2017-12-03: qty 1

## 2017-12-03 MED ORDER — EPOETIN ALFA 20000 UNIT/ML IJ SOLN
20000.0000 [IU] | INTRAMUSCULAR | Status: DC
  Administered 2017-12-03: 20000 [IU] via SUBCUTANEOUS

## 2017-12-04 LAB — PTH, INTACT AND CALCIUM
CALCIUM TOTAL (PTH): 9.3 mg/dL (ref 8.7–10.3)
PTH: 89 pg/mL — ABNORMAL HIGH (ref 15–65)

## 2017-12-10 ENCOUNTER — Other Ambulatory Visit: Payer: Self-pay | Admitting: Interventional Cardiology

## 2017-12-16 ENCOUNTER — Other Ambulatory Visit (HOSPITAL_COMMUNITY): Payer: Self-pay | Admitting: *Deleted

## 2017-12-17 ENCOUNTER — Ambulatory Visit (HOSPITAL_COMMUNITY)
Admission: RE | Admit: 2017-12-17 | Discharge: 2017-12-17 | Disposition: A | Discharge status: Home / Self Care | Payer: Medicare Other | Source: Ambulatory Visit | Attending: Nephrology | Admitting: Nephrology

## 2017-12-17 VITALS — BP 127/43 | HR 53 | Temp 98.2°F | Resp 20

## 2017-12-17 DIAGNOSIS — N185 Chronic kidney disease, stage 5: Secondary | ICD-10-CM

## 2017-12-17 DIAGNOSIS — N184 Chronic kidney disease, stage 4 (severe): Secondary | ICD-10-CM | POA: Insufficient documentation

## 2017-12-17 DIAGNOSIS — D631 Anemia in chronic kidney disease: Secondary | ICD-10-CM | POA: Insufficient documentation

## 2017-12-17 LAB — RENAL FUNCTION PANEL
Albumin: 3 g/dL — ABNORMAL LOW (ref 3.5–5.0)
Anion gap: 12 (ref 5–15)
BUN: 80 mg/dL — AB (ref 6–20)
CO2: 26 mmol/L (ref 22–32)
Calcium: 9.9 mg/dL (ref 8.9–10.3)
Chloride: 104 mmol/L (ref 101–111)
Creatinine, Ser: 5.71 mg/dL — ABNORMAL HIGH (ref 0.44–1.00)
GFR calc Af Amer: 7 mL/min — ABNORMAL LOW (ref >60)
GFR calc non Af Amer: 6 mL/min — ABNORMAL LOW (ref >60)
GLUCOSE: 136 mg/dL — AB (ref 65–99)
POTASSIUM: 3.9 mmol/L (ref 3.5–5.1)
Phosphorus: 6.8 mg/dL — ABNORMAL HIGH (ref 2.5–4.6)
SODIUM: 142 mmol/L (ref 135–145)

## 2017-12-17 LAB — POCT HEMOGLOBIN-HEMACUE: Hemoglobin: 11.8 g/dL — ABNORMAL LOW (ref 12.0–15.0)

## 2017-12-17 MED ORDER — EPOETIN ALFA 20000 UNIT/ML IJ SOLN
INTRAMUSCULAR | Status: AC
  Administered 2017-12-17: 20000 [IU] via SUBCUTANEOUS
  Filled 2017-12-17: qty 1

## 2017-12-17 MED ORDER — EPOETIN ALFA 20000 UNIT/ML IJ SOLN
20000.0000 [IU] | INTRAMUSCULAR | Status: DC
  Administered 2017-12-17: 20000 [IU] via SUBCUTANEOUS

## 2017-12-31 ENCOUNTER — Ambulatory Visit (HOSPITAL_COMMUNITY)
Admission: RE | Admit: 2017-12-31 | Discharge: 2017-12-31 | Disposition: A | Discharge status: Home / Self Care | Payer: Medicare Other | Source: Ambulatory Visit | Attending: Nephrology | Admitting: Nephrology

## 2017-12-31 VITALS — BP 137/46 | HR 50

## 2017-12-31 DIAGNOSIS — N184 Chronic kidney disease, stage 4 (severe): Secondary | ICD-10-CM | POA: Diagnosis not present

## 2017-12-31 DIAGNOSIS — N185 Chronic kidney disease, stage 5: Secondary | ICD-10-CM

## 2017-12-31 DIAGNOSIS — D631 Anemia in chronic kidney disease: Secondary | ICD-10-CM | POA: Insufficient documentation

## 2017-12-31 LAB — IRON AND TIBC
Iron: 81 ug/dL (ref 28–170)
Saturation Ratios: 30 % (ref 10.4–31.8)
TIBC: 267 ug/dL (ref 250–450)
UIBC: 186 ug/dL

## 2017-12-31 LAB — POCT HEMOGLOBIN-HEMACUE: Hemoglobin: 12.2 g/dL (ref 12.0–15.0)

## 2017-12-31 MED ORDER — EPOETIN ALFA 20000 UNIT/ML IJ SOLN: 20000.0000 [IU] | INTRAMUSCULAR | Status: DC

## 2018-01-05 DIAGNOSIS — D631 Anemia in chronic kidney disease: Secondary | ICD-10-CM | POA: Diagnosis not present

## 2018-01-05 DIAGNOSIS — N2581 Secondary hyperparathyroidism of renal origin: Secondary | ICD-10-CM | POA: Diagnosis not present

## 2018-01-05 DIAGNOSIS — N185 Chronic kidney disease, stage 5: Secondary | ICD-10-CM | POA: Diagnosis not present

## 2018-01-05 DIAGNOSIS — I120 Hypertensive chronic kidney disease with stage 5 chronic kidney disease or end stage renal disease: Secondary | ICD-10-CM | POA: Diagnosis not present

## 2018-01-13 ENCOUNTER — Encounter (HOSPITAL_COMMUNITY): Payer: Medicare Other

## 2018-01-14 ENCOUNTER — Ambulatory Visit (HOSPITAL_COMMUNITY)
Admission: RE | Admit: 2018-01-14 | Discharge: 2018-01-14 | Disposition: A | Discharge status: Home / Self Care | Payer: Medicare Other | Source: Ambulatory Visit | Attending: Nephrology | Admitting: Nephrology

## 2018-01-14 VITALS — BP 120/44 | HR 58 | Temp 98.2°F | Ht 64.5 in | Wt 172.0 lb

## 2018-01-14 DIAGNOSIS — D631 Anemia in chronic kidney disease: Secondary | ICD-10-CM | POA: Diagnosis not present

## 2018-01-14 DIAGNOSIS — N184 Chronic kidney disease, stage 4 (severe): Secondary | ICD-10-CM | POA: Diagnosis not present

## 2018-01-14 DIAGNOSIS — N185 Chronic kidney disease, stage 5: Secondary | ICD-10-CM

## 2018-01-14 LAB — RENAL FUNCTION PANEL
ANION GAP: 15 (ref 5–15)
Albumin: 3.1 g/dL — ABNORMAL LOW (ref 3.5–5.0)
BUN: 71 mg/dL — ABNORMAL HIGH (ref 8–23)
CHLORIDE: 99 mmol/L (ref 98–111)
CO2: 27 mmol/L (ref 22–32)
Calcium: 10.1 mg/dL (ref 8.9–10.3)
Creatinine, Ser: 6.49 mg/dL — ABNORMAL HIGH (ref 0.44–1.00)
GFR calc Af Amer: 6 mL/min — ABNORMAL LOW (ref >60)
GFR calc non Af Amer: 5 mL/min — ABNORMAL LOW (ref >60)
GLUCOSE: 122 mg/dL — AB (ref 70–99)
POTASSIUM: 3.6 mmol/L (ref 3.5–5.1)
Phosphorus: 6.4 mg/dL — ABNORMAL HIGH (ref 2.5–4.6)
Sodium: 141 mmol/L (ref 135–145)

## 2018-01-14 LAB — POCT HEMOGLOBIN-HEMACUE: HEMOGLOBIN: 11 g/dL — AB (ref 12.0–15.0)

## 2018-01-14 MED ORDER — EPOETIN ALFA 20000 UNIT/ML IJ SOLN
INTRAMUSCULAR | Status: AC
  Administered 2018-01-14: 20000 [IU] via SUBCUTANEOUS
  Filled 2018-01-14: qty 1

## 2018-01-14 MED ORDER — EPOETIN ALFA 20000 UNIT/ML IJ SOLN
20000.0000 [IU] | INTRAMUSCULAR | Status: DC
  Administered 2018-01-14: 20000 [IU] via SUBCUTANEOUS

## 2018-01-27 ENCOUNTER — Other Ambulatory Visit (HOSPITAL_COMMUNITY): Payer: Self-pay | Admitting: *Deleted

## 2018-01-28 ENCOUNTER — Ambulatory Visit (HOSPITAL_COMMUNITY)
Admission: RE | Admit: 2018-01-28 | Discharge: 2018-01-28 | Disposition: A | Discharge status: Home / Self Care | Payer: Medicare Other | Source: Ambulatory Visit | Attending: Nephrology | Admitting: Nephrology

## 2018-01-28 VITALS — BP 117/54 | HR 73 | Temp 98.3°F | Ht 64.5 in | Wt 170.0 lb

## 2018-01-28 DIAGNOSIS — Z5181 Encounter for therapeutic drug level monitoring: Secondary | ICD-10-CM | POA: Diagnosis not present

## 2018-01-28 DIAGNOSIS — N184 Chronic kidney disease, stage 4 (severe): Secondary | ICD-10-CM | POA: Diagnosis not present

## 2018-01-28 DIAGNOSIS — Z79899 Other long term (current) drug therapy: Secondary | ICD-10-CM | POA: Insufficient documentation

## 2018-01-28 DIAGNOSIS — N185 Chronic kidney disease, stage 5: Secondary | ICD-10-CM

## 2018-01-28 DIAGNOSIS — D631 Anemia in chronic kidney disease: Secondary | ICD-10-CM | POA: Diagnosis not present

## 2018-01-28 MED ORDER — EPOETIN ALFA 20000 UNIT/ML IJ SOLN
20000.0000 [IU] | INTRAMUSCULAR | Status: DC
  Administered 2018-01-28: 20000 [IU] via SUBCUTANEOUS

## 2018-01-28 MED ORDER — EPOETIN ALFA 20000 UNIT/ML IJ SOLN
INTRAMUSCULAR | Status: AC
  Filled 2018-01-28: qty 1

## 2018-01-29 LAB — POCT HEMOGLOBIN-HEMACUE: Hemoglobin: 10.5 g/dL — ABNORMAL LOW (ref 12.0–15.0)

## 2018-02-11 ENCOUNTER — Ambulatory Visit (HOSPITAL_COMMUNITY)
Admission: RE | Admit: 2018-02-11 | Discharge: 2018-02-11 | Disposition: A | Discharge status: Home / Self Care | Payer: Medicare Other | Source: Ambulatory Visit | Attending: Nephrology | Admitting: Nephrology

## 2018-02-11 VITALS — BP 160/46 | HR 52 | Resp 18

## 2018-02-11 DIAGNOSIS — N185 Chronic kidney disease, stage 5: Secondary | ICD-10-CM | POA: Insufficient documentation

## 2018-02-11 LAB — IRON AND TIBC
Iron: 87 ug/dL (ref 28–170)
Saturation Ratios: 31 % (ref 10.4–31.8)
TIBC: 283 ug/dL (ref 250–450)
UIBC: 196 ug/dL

## 2018-02-11 LAB — POCT HEMOGLOBIN-HEMACUE: Hemoglobin: 10.4 g/dL — ABNORMAL LOW (ref 12.0–15.0)

## 2018-02-11 MED ORDER — EPOETIN ALFA 20000 UNIT/ML IJ SOLN
INTRAMUSCULAR | Status: AC
  Filled 2018-02-11: qty 1

## 2018-02-11 MED ORDER — EPOETIN ALFA 20000 UNIT/ML IJ SOLN
20000.0000 [IU] | INTRAMUSCULAR | Status: DC
  Administered 2018-02-11: 20000 [IU] via SUBCUTANEOUS

## 2018-02-24 DIAGNOSIS — E113552 Type 2 diabetes mellitus with stable proliferative diabetic retinopathy, left eye: Secondary | ICD-10-CM | POA: Diagnosis not present

## 2018-02-24 DIAGNOSIS — H35372 Puckering of macula, left eye: Secondary | ICD-10-CM | POA: Diagnosis not present

## 2018-02-24 DIAGNOSIS — E113491 Type 2 diabetes mellitus with severe nonproliferative diabetic retinopathy without macular edema, right eye: Secondary | ICD-10-CM | POA: Diagnosis not present

## 2018-02-24 DIAGNOSIS — H3561 Retinal hemorrhage, right eye: Secondary | ICD-10-CM | POA: Diagnosis not present

## 2018-02-25 ENCOUNTER — Encounter (HOSPITAL_COMMUNITY)
Admission: RE | Admit: 2018-02-25 | Discharge: 2018-02-25 | Disposition: A | Discharge status: Home / Self Care | Payer: Medicare Other | Source: Ambulatory Visit | Attending: Nephrology | Admitting: Nephrology

## 2018-02-25 VITALS — BP 134/40 | HR 50 | Temp 98.7°F

## 2018-02-25 DIAGNOSIS — N184 Chronic kidney disease, stage 4 (severe): Secondary | ICD-10-CM | POA: Insufficient documentation

## 2018-02-25 DIAGNOSIS — D631 Anemia in chronic kidney disease: Secondary | ICD-10-CM | POA: Diagnosis not present

## 2018-02-25 DIAGNOSIS — N185 Chronic kidney disease, stage 5: Secondary | ICD-10-CM

## 2018-02-25 LAB — RENAL FUNCTION PANEL
ALBUMIN: 2.8 g/dL — AB (ref 3.5–5.0)
ANION GAP: 12 (ref 5–15)
BUN: 73 mg/dL — ABNORMAL HIGH (ref 8–23)
CALCIUM: 9.1 mg/dL (ref 8.9–10.3)
CO2: 24 mmol/L (ref 22–32)
Chloride: 106 mmol/L (ref 98–111)
Creatinine, Ser: 6.24 mg/dL — ABNORMAL HIGH (ref 0.44–1.00)
GFR, EST AFRICAN AMERICAN: 7 mL/min — AB (ref >60)
GFR, EST NON AFRICAN AMERICAN: 6 mL/min — AB (ref >60)
GLUCOSE: 133 mg/dL — AB (ref 70–99)
PHOSPHORUS: 6.6 mg/dL — AB (ref 2.5–4.6)
POTASSIUM: 4.5 mmol/L (ref 3.5–5.1)
Sodium: 142 mmol/L (ref 135–145)

## 2018-02-25 LAB — FERRITIN: Ferritin: 660 ng/mL — ABNORMAL HIGH (ref 11–307)

## 2018-02-25 LAB — POCT HEMOGLOBIN-HEMACUE: HEMOGLOBIN: 10.6 g/dL — AB (ref 12.0–15.0)

## 2018-02-25 MED ORDER — EPOETIN ALFA 20000 UNIT/ML IJ SOLN
20000.0000 [IU] | INTRAMUSCULAR | Status: DC
  Administered 2018-02-25: 20000 [IU] via SUBCUTANEOUS

## 2018-02-25 MED ORDER — EPOETIN ALFA 20000 UNIT/ML IJ SOLN
INTRAMUSCULAR | Status: AC
  Filled 2018-02-25: qty 1

## 2018-02-26 LAB — PTH, INTACT AND CALCIUM
Calcium, Total (PTH): 9 mg/dL (ref 8.7–10.3)
PTH: 88 pg/mL — AB (ref 15–65)

## 2018-03-03 DIAGNOSIS — D631 Anemia in chronic kidney disease: Secondary | ICD-10-CM | POA: Diagnosis not present

## 2018-03-03 DIAGNOSIS — I120 Hypertensive chronic kidney disease with stage 5 chronic kidney disease or end stage renal disease: Secondary | ICD-10-CM | POA: Diagnosis not present

## 2018-03-03 DIAGNOSIS — N185 Chronic kidney disease, stage 5: Secondary | ICD-10-CM | POA: Diagnosis not present

## 2018-03-03 DIAGNOSIS — N2581 Secondary hyperparathyroidism of renal origin: Secondary | ICD-10-CM | POA: Diagnosis not present

## 2018-03-10 DIAGNOSIS — E0869 Diabetes mellitus due to underlying condition with other specified complication: Secondary | ICD-10-CM | POA: Diagnosis not present

## 2018-03-10 DIAGNOSIS — N186 End stage renal disease: Secondary | ICD-10-CM | POA: Diagnosis not present

## 2018-03-11 ENCOUNTER — Encounter (HOSPITAL_COMMUNITY): Payer: Medicare Other

## 2018-03-12 DIAGNOSIS — N186 End stage renal disease: Secondary | ICD-10-CM | POA: Diagnosis not present

## 2018-03-12 DIAGNOSIS — E0869 Diabetes mellitus due to underlying condition with other specified complication: Secondary | ICD-10-CM | POA: Diagnosis not present

## 2018-03-13 DIAGNOSIS — E441 Mild protein-calorie malnutrition: Secondary | ICD-10-CM | POA: Insufficient documentation

## 2018-03-14 DIAGNOSIS — Z992 Dependence on renal dialysis: Secondary | ICD-10-CM | POA: Diagnosis not present

## 2018-03-14 DIAGNOSIS — E1129 Type 2 diabetes mellitus with other diabetic kidney complication: Secondary | ICD-10-CM | POA: Diagnosis not present

## 2018-03-14 DIAGNOSIS — N186 End stage renal disease: Secondary | ICD-10-CM | POA: Diagnosis not present

## 2018-03-14 DIAGNOSIS — E0869 Diabetes mellitus due to underlying condition with other specified complication: Secondary | ICD-10-CM | POA: Diagnosis not present

## 2018-03-15 DIAGNOSIS — N186 End stage renal disease: Secondary | ICD-10-CM | POA: Diagnosis not present

## 2018-03-15 DIAGNOSIS — Z992 Dependence on renal dialysis: Secondary | ICD-10-CM | POA: Diagnosis not present

## 2018-03-15 DIAGNOSIS — E1129 Type 2 diabetes mellitus with other diabetic kidney complication: Secondary | ICD-10-CM | POA: Diagnosis not present

## 2018-03-17 DIAGNOSIS — N186 End stage renal disease: Secondary | ICD-10-CM | POA: Diagnosis not present

## 2018-03-17 DIAGNOSIS — Z992 Dependence on renal dialysis: Secondary | ICD-10-CM | POA: Diagnosis not present

## 2018-03-17 DIAGNOSIS — D649 Anemia, unspecified: Secondary | ICD-10-CM | POA: Diagnosis not present

## 2018-03-19 DIAGNOSIS — N186 End stage renal disease: Secondary | ICD-10-CM | POA: Diagnosis not present

## 2018-03-19 DIAGNOSIS — D649 Anemia, unspecified: Secondary | ICD-10-CM | POA: Diagnosis not present

## 2018-03-21 DIAGNOSIS — N186 End stage renal disease: Secondary | ICD-10-CM | POA: Diagnosis not present

## 2018-03-21 DIAGNOSIS — D649 Anemia, unspecified: Secondary | ICD-10-CM | POA: Diagnosis not present

## 2018-03-24 DIAGNOSIS — D649 Anemia, unspecified: Secondary | ICD-10-CM | POA: Diagnosis not present

## 2018-03-24 DIAGNOSIS — N186 End stage renal disease: Secondary | ICD-10-CM | POA: Diagnosis not present

## 2018-03-26 DIAGNOSIS — D649 Anemia, unspecified: Secondary | ICD-10-CM | POA: Diagnosis not present

## 2018-03-26 DIAGNOSIS — N186 End stage renal disease: Secondary | ICD-10-CM | POA: Diagnosis not present

## 2018-03-28 DIAGNOSIS — N186 End stage renal disease: Secondary | ICD-10-CM | POA: Diagnosis not present

## 2018-03-28 DIAGNOSIS — D649 Anemia, unspecified: Secondary | ICD-10-CM | POA: Diagnosis not present

## 2018-03-31 DIAGNOSIS — Z992 Dependence on renal dialysis: Secondary | ICD-10-CM | POA: Diagnosis not present

## 2018-03-31 DIAGNOSIS — N186 End stage renal disease: Secondary | ICD-10-CM | POA: Diagnosis not present

## 2018-03-31 DIAGNOSIS — K649 Unspecified hemorrhoids: Secondary | ICD-10-CM | POA: Diagnosis not present

## 2018-03-31 DIAGNOSIS — F411 Generalized anxiety disorder: Secondary | ICD-10-CM | POA: Diagnosis not present

## 2018-03-31 DIAGNOSIS — Z23 Encounter for immunization: Secondary | ICD-10-CM | POA: Diagnosis not present

## 2018-03-31 DIAGNOSIS — D649 Anemia, unspecified: Secondary | ICD-10-CM | POA: Diagnosis not present

## 2018-04-02 ENCOUNTER — Emergency Department (HOSPITAL_COMMUNITY): Payer: Medicare Other

## 2018-04-02 ENCOUNTER — Encounter (HOSPITAL_COMMUNITY): Payer: Self-pay | Admitting: Emergency Medicine

## 2018-04-02 ENCOUNTER — Inpatient Hospital Stay (HOSPITAL_COMMUNITY)
Admission: EM | Admit: 2018-04-02 | Discharge: 2018-04-15 | DRG: 246 | Disposition: A | Discharge status: Rehabilitation Facility | Payer: Medicare Other | Attending: Internal Medicine | Admitting: Internal Medicine

## 2018-04-02 DIAGNOSIS — I468 Cardiac arrest due to other underlying condition: Secondary | ICD-10-CM | POA: Diagnosis not present

## 2018-04-02 DIAGNOSIS — I2510 Atherosclerotic heart disease of native coronary artery without angina pectoris: Secondary | ICD-10-CM | POA: Diagnosis present

## 2018-04-02 DIAGNOSIS — E1122 Type 2 diabetes mellitus with diabetic chronic kidney disease: Secondary | ICD-10-CM | POA: Diagnosis present

## 2018-04-02 DIAGNOSIS — N2581 Secondary hyperparathyroidism of renal origin: Secondary | ICD-10-CM | POA: Diagnosis present

## 2018-04-02 DIAGNOSIS — K219 Gastro-esophageal reflux disease without esophagitis: Secondary | ICD-10-CM | POA: Diagnosis present

## 2018-04-02 DIAGNOSIS — I160 Hypertensive urgency: Secondary | ICD-10-CM | POA: Diagnosis not present

## 2018-04-02 DIAGNOSIS — I4581 Long QT syndrome: Secondary | ICD-10-CM | POA: Diagnosis not present

## 2018-04-02 DIAGNOSIS — Z823 Family history of stroke: Secondary | ICD-10-CM | POA: Diagnosis not present

## 2018-04-02 DIAGNOSIS — J9601 Acute respiratory failure with hypoxia: Secondary | ICD-10-CM | POA: Diagnosis not present

## 2018-04-02 DIAGNOSIS — D638 Anemia in other chronic diseases classified elsewhere: Secondary | ICD-10-CM | POA: Diagnosis not present

## 2018-04-02 DIAGNOSIS — Z992 Dependence on renal dialysis: Secondary | ICD-10-CM

## 2018-04-02 DIAGNOSIS — K649 Unspecified hemorrhoids: Secondary | ICD-10-CM | POA: Diagnosis present

## 2018-04-02 DIAGNOSIS — Z951 Presence of aortocoronary bypass graft: Secondary | ICD-10-CM

## 2018-04-02 DIAGNOSIS — Z8349 Family history of other endocrine, nutritional and metabolic diseases: Secondary | ICD-10-CM

## 2018-04-02 DIAGNOSIS — I214 Non-ST elevation (NSTEMI) myocardial infarction: Secondary | ICD-10-CM | POA: Diagnosis not present

## 2018-04-02 DIAGNOSIS — Z049 Encounter for examination and observation for unspecified reason: Secondary | ICD-10-CM | POA: Diagnosis not present

## 2018-04-02 DIAGNOSIS — I10 Essential (primary) hypertension: Secondary | ICD-10-CM | POA: Diagnosis not present

## 2018-04-02 DIAGNOSIS — F329 Major depressive disorder, single episode, unspecified: Secondary | ICD-10-CM | POA: Diagnosis present

## 2018-04-02 DIAGNOSIS — Z7982 Long term (current) use of aspirin: Secondary | ICD-10-CM | POA: Diagnosis not present

## 2018-04-02 DIAGNOSIS — E1129 Type 2 diabetes mellitus with other diabetic kidney complication: Secondary | ICD-10-CM | POA: Diagnosis not present

## 2018-04-02 DIAGNOSIS — D696 Thrombocytopenia, unspecified: Secondary | ICD-10-CM | POA: Diagnosis present

## 2018-04-02 DIAGNOSIS — I509 Heart failure, unspecified: Secondary | ICD-10-CM | POA: Diagnosis present

## 2018-04-02 DIAGNOSIS — I480 Paroxysmal atrial fibrillation: Secondary | ICD-10-CM | POA: Diagnosis present

## 2018-04-02 DIAGNOSIS — I25709 Atherosclerosis of coronary artery bypass graft(s), unspecified, with unspecified angina pectoris: Secondary | ICD-10-CM | POA: Diagnosis not present

## 2018-04-02 DIAGNOSIS — Z8674 Personal history of sudden cardiac arrest: Secondary | ICD-10-CM | POA: Diagnosis not present

## 2018-04-02 DIAGNOSIS — I213 ST elevation (STEMI) myocardial infarction of unspecified site: Secondary | ICD-10-CM | POA: Diagnosis not present

## 2018-04-02 DIAGNOSIS — G931 Anoxic brain damage, not elsewhere classified: Secondary | ICD-10-CM | POA: Diagnosis present

## 2018-04-02 DIAGNOSIS — Z0189 Encounter for other specified special examinations: Secondary | ICD-10-CM

## 2018-04-02 DIAGNOSIS — M1990 Unspecified osteoarthritis, unspecified site: Secondary | ICD-10-CM | POA: Diagnosis present

## 2018-04-02 DIAGNOSIS — R402233 Coma scale, best verbal response, inappropriate words, at hospital admission: Secondary | ICD-10-CM | POA: Diagnosis not present

## 2018-04-02 DIAGNOSIS — I4891 Unspecified atrial fibrillation: Secondary | ICD-10-CM | POA: Diagnosis present

## 2018-04-02 DIAGNOSIS — Z833 Family history of diabetes mellitus: Secondary | ICD-10-CM | POA: Diagnosis not present

## 2018-04-02 DIAGNOSIS — Z888 Allergy status to other drugs, medicaments and biological substances status: Secondary | ICD-10-CM

## 2018-04-02 DIAGNOSIS — Z8701 Personal history of pneumonia (recurrent): Secondary | ICD-10-CM

## 2018-04-02 DIAGNOSIS — E785 Hyperlipidemia, unspecified: Secondary | ICD-10-CM | POA: Diagnosis present

## 2018-04-02 DIAGNOSIS — I462 Cardiac arrest due to underlying cardiac condition: Secondary | ICD-10-CM | POA: Diagnosis not present

## 2018-04-02 DIAGNOSIS — R402363 Coma scale, best motor response, obeys commands, at hospital admission: Secondary | ICD-10-CM | POA: Diagnosis not present

## 2018-04-02 DIAGNOSIS — R008 Other abnormalities of heart beat: Secondary | ICD-10-CM | POA: Diagnosis present

## 2018-04-02 DIAGNOSIS — R9431 Abnormal electrocardiogram [ECG] [EKG]: Secondary | ICD-10-CM

## 2018-04-02 DIAGNOSIS — H9190 Unspecified hearing loss, unspecified ear: Secondary | ICD-10-CM | POA: Diagnosis present

## 2018-04-02 DIAGNOSIS — H409 Unspecified glaucoma: Secondary | ICD-10-CM | POA: Diagnosis present

## 2018-04-02 DIAGNOSIS — Z955 Presence of coronary angioplasty implant and graft: Secondary | ICD-10-CM

## 2018-04-02 DIAGNOSIS — I132 Hypertensive heart and chronic kidney disease with heart failure and with stage 5 chronic kidney disease, or end stage renal disease: Secondary | ICD-10-CM | POA: Diagnosis present

## 2018-04-02 DIAGNOSIS — J9811 Atelectasis: Secondary | ICD-10-CM | POA: Diagnosis not present

## 2018-04-02 DIAGNOSIS — I252 Old myocardial infarction: Secondary | ICD-10-CM

## 2018-04-02 DIAGNOSIS — R059 Cough, unspecified: Secondary | ICD-10-CM

## 2018-04-02 DIAGNOSIS — E1165 Type 2 diabetes mellitus with hyperglycemia: Secondary | ICD-10-CM | POA: Diagnosis present

## 2018-04-02 DIAGNOSIS — K580 Irritable bowel syndrome with diarrhea: Secondary | ICD-10-CM | POA: Diagnosis present

## 2018-04-02 DIAGNOSIS — E1142 Type 2 diabetes mellitus with diabetic polyneuropathy: Secondary | ICD-10-CM

## 2018-04-02 DIAGNOSIS — R05 Cough: Secondary | ICD-10-CM

## 2018-04-02 DIAGNOSIS — D62 Acute posthemorrhagic anemia: Secondary | ICD-10-CM | POA: Diagnosis not present

## 2018-04-02 DIAGNOSIS — R0681 Apnea, not elsewhere classified: Secondary | ICD-10-CM | POA: Diagnosis not present

## 2018-04-02 DIAGNOSIS — I120 Hypertensive chronic kidney disease with stage 5 chronic kidney disease or end stage renal disease: Secondary | ICD-10-CM | POA: Diagnosis not present

## 2018-04-02 DIAGNOSIS — I340 Nonrheumatic mitral (valve) insufficiency: Secondary | ICD-10-CM | POA: Diagnosis not present

## 2018-04-02 DIAGNOSIS — R402364 Coma scale, best motor response, obeys commands, 24 hours or more after hospital admission: Secondary | ICD-10-CM | POA: Diagnosis not present

## 2018-04-02 DIAGNOSIS — I25810 Atherosclerosis of coronary artery bypass graft(s) without angina pectoris: Secondary | ICD-10-CM | POA: Diagnosis not present

## 2018-04-02 DIAGNOSIS — R5381 Other malaise: Secondary | ICD-10-CM

## 2018-04-02 DIAGNOSIS — Z79899 Other long term (current) drug therapy: Secondary | ICD-10-CM

## 2018-04-02 DIAGNOSIS — I472 Ventricular tachycardia, unspecified: Secondary | ICD-10-CM

## 2018-04-02 DIAGNOSIS — Z794 Long term (current) use of insulin: Secondary | ICD-10-CM

## 2018-04-02 DIAGNOSIS — I455 Other specified heart block: Secondary | ICD-10-CM | POA: Diagnosis not present

## 2018-04-02 DIAGNOSIS — R402133 Coma scale, eyes open, to sound, at hospital admission: Secondary | ICD-10-CM | POA: Diagnosis present

## 2018-04-02 DIAGNOSIS — Z8249 Family history of ischemic heart disease and other diseases of the circulatory system: Secondary | ICD-10-CM | POA: Diagnosis not present

## 2018-04-02 DIAGNOSIS — T17908A Unspecified foreign body in respiratory tract, part unspecified causing other injury, initial encounter: Secondary | ICD-10-CM | POA: Diagnosis not present

## 2018-04-02 DIAGNOSIS — R918 Other nonspecific abnormal finding of lung field: Secondary | ICD-10-CM | POA: Diagnosis not present

## 2018-04-02 DIAGNOSIS — E119 Type 2 diabetes mellitus without complications: Secondary | ICD-10-CM | POA: Diagnosis not present

## 2018-04-02 DIAGNOSIS — Z4659 Encounter for fitting and adjustment of other gastrointestinal appliance and device: Secondary | ICD-10-CM | POA: Diagnosis not present

## 2018-04-02 DIAGNOSIS — R001 Bradycardia, unspecified: Secondary | ICD-10-CM

## 2018-04-02 DIAGNOSIS — Z9911 Dependence on respirator [ventilator] status: Secondary | ICD-10-CM | POA: Diagnosis not present

## 2018-04-02 DIAGNOSIS — D631 Anemia in chronic kidney disease: Secondary | ICD-10-CM | POA: Diagnosis present

## 2018-04-02 DIAGNOSIS — Z66 Do not resuscitate: Secondary | ICD-10-CM | POA: Diagnosis present

## 2018-04-02 DIAGNOSIS — N186 End stage renal disease: Secondary | ICD-10-CM

## 2018-04-02 DIAGNOSIS — Z4682 Encounter for fitting and adjustment of non-vascular catheter: Secondary | ICD-10-CM | POA: Diagnosis not present

## 2018-04-02 DIAGNOSIS — J690 Pneumonitis due to inhalation of food and vomit: Secondary | ICD-10-CM

## 2018-04-02 DIAGNOSIS — J90 Pleural effusion, not elsewhere classified: Secondary | ICD-10-CM | POA: Diagnosis not present

## 2018-04-02 DIAGNOSIS — I469 Cardiac arrest, cause unspecified: Secondary | ICD-10-CM | POA: Diagnosis not present

## 2018-04-02 DIAGNOSIS — R0989 Other specified symptoms and signs involving the circulatory and respiratory systems: Secondary | ICD-10-CM | POA: Diagnosis not present

## 2018-04-02 DIAGNOSIS — R402144 Coma scale, eyes open, spontaneous, 24 hours or more after hospital admission: Secondary | ICD-10-CM | POA: Diagnosis not present

## 2018-04-02 DIAGNOSIS — Z882 Allergy status to sulfonamides status: Secondary | ICD-10-CM

## 2018-04-02 DIAGNOSIS — A0472 Enterocolitis due to Clostridium difficile, not specified as recurrent: Secondary | ICD-10-CM | POA: Diagnosis present

## 2018-04-02 DIAGNOSIS — D649 Anemia, unspecified: Secondary | ICD-10-CM | POA: Diagnosis not present

## 2018-04-02 DIAGNOSIS — R402254 Coma scale, best verbal response, oriented, 24 hours or more after hospital admission: Secondary | ICD-10-CM | POA: Diagnosis not present

## 2018-04-02 DIAGNOSIS — Z8673 Personal history of transient ischemic attack (TIA), and cerebral infarction without residual deficits: Secondary | ICD-10-CM

## 2018-04-02 DIAGNOSIS — Z87442 Personal history of urinary calculi: Secondary | ICD-10-CM

## 2018-04-02 DIAGNOSIS — I4901 Ventricular fibrillation: Secondary | ICD-10-CM | POA: Diagnosis not present

## 2018-04-02 DIAGNOSIS — S2243XA Multiple fractures of ribs, bilateral, initial encounter for closed fracture: Secondary | ICD-10-CM | POA: Diagnosis not present

## 2018-04-02 DIAGNOSIS — R4182 Altered mental status, unspecified: Secondary | ICD-10-CM | POA: Diagnosis not present

## 2018-04-02 LAB — I-STAT CG4 LACTIC ACID, ED
LACTIC ACID, VENOUS: 5.11 mmol/L — AB (ref 0.5–1.9)
Lactic Acid, Venous: 3.33 mmol/L (ref 0.5–1.9)

## 2018-04-02 LAB — CBC WITH DIFFERENTIAL/PLATELET
BASOS ABS: 0 10*3/uL (ref 0.0–0.1)
Basophils Relative: 0 %
EOS ABS: 0 10*3/uL (ref 0.0–0.7)
Eosinophils Relative: 0 %
HCT: 40.4 % (ref 36.0–46.0)
HEMOGLOBIN: 12.2 g/dL (ref 12.0–15.0)
LYMPHS ABS: 2.3 10*3/uL (ref 0.7–4.0)
Lymphocytes Relative: 12 %
MCH: 33.7 pg (ref 26.0–34.0)
MCHC: 30.2 g/dL (ref 30.0–36.0)
MCV: 111.6 fL — ABNORMAL HIGH (ref 78.0–100.0)
MONOS PCT: 8 %
Monocytes Absolute: 1.5 10*3/uL — ABNORMAL HIGH (ref 0.1–1.0)
NEUTROS ABS: 15.1 10*3/uL — AB (ref 1.7–7.7)
Neutrophils Relative %: 80 %
Platelets: 204 10*3/uL (ref 150–400)
RBC: 3.62 MIL/uL — ABNORMAL LOW (ref 3.87–5.11)
RDW: 14.6 % (ref 11.5–15.5)
WBC: 18.9 10*3/uL — ABNORMAL HIGH (ref 4.0–10.5)

## 2018-04-02 LAB — I-STAT CHEM 8, ED
BUN: 21 mg/dL (ref 8–23)
CALCIUM ION: 1.01 mmol/L — AB (ref 1.15–1.40)
Chloride: 100 mmol/L (ref 98–111)
Creatinine, Ser: 2 mg/dL — ABNORMAL HIGH (ref 0.44–1.00)
Glucose, Bld: 276 mg/dL — ABNORMAL HIGH (ref 70–99)
HEMATOCRIT: 31 % — AB (ref 36.0–46.0)
HEMOGLOBIN: 10.5 g/dL — AB (ref 12.0–15.0)
Potassium: 4 mmol/L (ref 3.5–5.1)
Sodium: 138 mmol/L (ref 135–145)
TCO2: 27 mmol/L (ref 22–32)

## 2018-04-02 LAB — I-STAT TROPONIN, ED: TROPONIN I, POC: 0.2 ng/mL — AB (ref 0.00–0.08)

## 2018-04-02 LAB — I-STAT ARTERIAL BLOOD GAS, ED
Bicarbonate: 25.9 mmol/L (ref 20.0–28.0)
O2 SAT: 100 %
TCO2: 27 mmol/L (ref 22–32)
pCO2 arterial: 49.3 mmHg — ABNORMAL HIGH (ref 32.0–48.0)
pH, Arterial: 7.328 — ABNORMAL LOW (ref 7.350–7.450)
pO2, Arterial: 280 mmHg — ABNORMAL HIGH (ref 83.0–108.0)

## 2018-04-02 LAB — COMPREHENSIVE METABOLIC PANEL
ALBUMIN: 3.1 g/dL — AB (ref 3.5–5.0)
ALT: 128 U/L — AB (ref 0–44)
AST: 196 U/L — AB (ref 15–41)
Alkaline Phosphatase: 91 U/L (ref 38–126)
Anion gap: 15 (ref 5–15)
BILIRUBIN TOTAL: 0.7 mg/dL (ref 0.3–1.2)
BUN: 18 mg/dL (ref 8–23)
CO2: 25 mmol/L (ref 22–32)
Calcium: 8.6 mg/dL — ABNORMAL LOW (ref 8.9–10.3)
Chloride: 100 mmol/L (ref 98–111)
Creatinine, Ser: 1.91 mg/dL — ABNORMAL HIGH (ref 0.44–1.00)
GFR calc Af Amer: 28 mL/min — ABNORMAL LOW (ref >60)
GFR calc non Af Amer: 24 mL/min — ABNORMAL LOW (ref >60)
GLUCOSE: 269 mg/dL — AB (ref 70–99)
Potassium: 3.8 mmol/L (ref 3.5–5.1)
Sodium: 140 mmol/L (ref 135–145)
TOTAL PROTEIN: 6.7 g/dL (ref 6.5–8.1)

## 2018-04-02 LAB — PROTIME-INR
INR: 1.04
PROTHROMBIN TIME: 13.5 s (ref 11.4–15.2)

## 2018-04-02 LAB — LACTIC ACID, PLASMA: LACTIC ACID, VENOUS: 3.4 mmol/L — AB (ref 0.5–1.9)

## 2018-04-02 MED ORDER — PIPERACILLIN-TAZOBACTAM 3.375 G IVPB
3.3750 g | Freq: Two times a day (BID) | INTRAVENOUS | Status: DC
  Administered 2018-04-03 (×2): 3.375 g via INTRAVENOUS
  Filled 2018-04-02 (×4): qty 50

## 2018-04-02 MED ORDER — SODIUM CHLORIDE 0.9 % IV SOLN
1.0000 g | Freq: Once | INTRAVENOUS | Status: AC
  Administered 2018-04-03: 1 g via INTRAVENOUS
  Filled 2018-04-02 (×2): qty 10

## 2018-04-02 MED ORDER — VANCOMYCIN HCL 10 G IV SOLR
1500.0000 mg | Freq: Once | INTRAVENOUS | Status: AC
  Administered 2018-04-03: 1500 mg via INTRAVENOUS
  Filled 2018-04-02 (×2): qty 1500

## 2018-04-02 MED ORDER — SODIUM CHLORIDE 0.9 % IV SOLN
INTRAVENOUS | Status: DC
  Administered 2018-04-02 – 2018-04-06 (×2): via INTRAVENOUS

## 2018-04-02 MED ORDER — HEPARIN SODIUM (PORCINE) 5000 UNIT/ML IJ SOLN
5000.0000 [IU] | Freq: Three times a day (TID) | INTRAMUSCULAR | Status: DC
  Administered 2018-04-02: 5000 [IU] via SUBCUTANEOUS
  Filled 2018-04-02: qty 1

## 2018-04-02 MED ORDER — VANCOMYCIN HCL IN DEXTROSE 1-5 GM/200ML-% IV SOLN
1000.0000 mg | INTRAVENOUS | Status: DC
  Administered 2018-04-04: 1000 mg via INTRAVENOUS
  Filled 2018-04-02 (×2): qty 200

## 2018-04-02 MED ORDER — FENTANYL CITRATE (PF) 100 MCG/2ML IJ SOLN
50.0000 ug | INTRAMUSCULAR | Status: DC | PRN
  Administered 2018-04-03 – 2018-04-04 (×5): 50 ug via INTRAVENOUS
  Filled 2018-04-02: qty 2

## 2018-04-02 MED ORDER — BISACODYL 10 MG RE SUPP: 10.0000 mg | Freq: Every day | RECTAL | Status: DC | PRN

## 2018-04-02 MED ORDER — PIPERACILLIN-TAZOBACTAM 3.375 G IVPB 30 MIN
3.3750 g | Freq: Once | INTRAVENOUS | Status: AC
  Administered 2018-04-02: 3.375 g via INTRAVENOUS
  Filled 2018-04-02: qty 50

## 2018-04-02 MED ORDER — FENTANYL CITRATE (PF) 100 MCG/2ML IJ SOLN: 50.0000 ug | INTRAMUSCULAR | Status: DC | PRN

## 2018-04-02 MED ORDER — ASPIRIN 81 MG PO CHEW
81.0000 mg | CHEWABLE_TABLET | Freq: Every day | ORAL | Status: DC
  Administered 2018-04-02 – 2018-04-04 (×3): 81 mg
  Filled 2018-04-02 (×3): qty 1

## 2018-04-02 MED ORDER — INSULIN ASPART 100 UNIT/ML ~~LOC~~ SOLN
0.0000 [IU] | SUBCUTANEOUS | Status: DC
  Administered 2018-04-03: 5 [IU] via SUBCUTANEOUS

## 2018-04-02 MED ORDER — FAMOTIDINE IN NACL 20-0.9 MG/50ML-% IV SOLN
20.0000 mg | Freq: Every day | INTRAVENOUS | Status: DC
  Administered 2018-04-02 – 2018-04-04 (×2): 20 mg via INTRAVENOUS
  Filled 2018-04-02 (×2): qty 50

## 2018-04-02 MED ORDER — MIDAZOLAM HCL 2 MG/2ML IJ SOLN
1.0000 mg | INTRAMUSCULAR | Status: DC | PRN
  Administered 2018-04-02 – 2018-04-03 (×2): 1 mg via INTRAVENOUS
  Filled 2018-04-02 (×2): qty 2

## 2018-04-02 MED ORDER — DOCUSATE SODIUM 50 MG/5ML PO LIQD
100.0000 mg | Freq: Two times a day (BID) | ORAL | Status: DC | PRN
  Filled 2018-04-02: qty 10

## 2018-04-02 MED ORDER — ATORVASTATIN CALCIUM 10 MG PO TABS
10.0000 mg | ORAL_TABLET | Freq: Every day | ORAL | Status: DC
  Administered 2018-04-03: 10 mg
  Filled 2018-04-02: qty 1

## 2018-04-02 MED ORDER — METOPROLOL TARTRATE 5 MG/5ML IV SOLN
INTRAVENOUS | Status: AC
  Administered 2018-04-02: 5 mg via INTRAVENOUS
  Filled 2018-04-02: qty 5

## 2018-04-02 MED ORDER — METOPROLOL TARTRATE 5 MG/5ML IV SOLN
2.5000 mg | INTRAVENOUS | Status: DC | PRN
  Administered 2018-04-02 – 2018-04-06 (×2): 5 mg via INTRAVENOUS
  Filled 2018-04-02: qty 5

## 2018-04-02 MED ORDER — SODIUM CHLORIDE 0.9 % IV SOLN: 250.0000 mL | INTRAVENOUS | Status: DC | PRN

## 2018-04-02 MED ORDER — FENTANYL CITRATE (PF) 100 MCG/2ML IJ SOLN
INTRAMUSCULAR | Status: AC
  Filled 2018-04-02: qty 2

## 2018-04-02 MED ORDER — MIDAZOLAM HCL 2 MG/2ML IJ SOLN
1.0000 mg | INTRAMUSCULAR | Status: DC | PRN
  Administered 2018-04-02: 1 mg via INTRAVENOUS
  Filled 2018-04-02: qty 2

## 2018-04-02 MED ORDER — HYDRALAZINE HCL 20 MG/ML IJ SOLN
10.0000 mg | INTRAMUSCULAR | Status: DC | PRN
  Administered 2018-04-06: 20 mg via INTRAVENOUS
  Filled 2018-04-02 (×2): qty 1

## 2018-04-02 MED ORDER — FENTANYL CITRATE (PF) 100 MCG/2ML IJ SOLN
50.0000 ug | INTRAMUSCULAR | Status: DC | PRN
  Administered 2018-04-02 – 2018-04-03 (×2): 50 ug via INTRAVENOUS
  Filled 2018-04-02 (×6): qty 2

## 2018-04-02 NOTE — H&P (Addendum)
NAME:  [name], MRN:  [id number], DOB:  1940-11-22, LOS: 0 ADMISSION DATE:  04/02/2018, CONSULTATION DATE:  04/02/18 REFERRING MD:  Ralene Bathe CHIEF COMPLAINT:  Cardiac Arrest   Brief History   [name] is a 77 y.o. female who has extensive PMH, presented to [facility] ED 9/19 after cardiac arrest at home possibly following a choking event (family heard her plate drop and thought she might have choked).  Family started CPR while awaiting EMS.  On EMS arrival, she was shocked once for VF before developing asystole.  Total of roughly 12 - 13 minutes of downtime prior to ROSC (including time that family provided CPR).  Significant Hospital Events   9/19 > admit.  Consults: date of consult/date signed off & final recs:  Cardiology, 9/19 >   Procedures (surgical and bedside):  ETT 9/19 >   Significant Diagnostic Tests:  CT head 9/19 > age related volume loss. Echo 9/20 >   Micro Data: Blood 9/19 >  Sputum 9/19 >   Antimicrobials:  Zosyn 9/19 >   Subjective:  On vent, follows basic commands intermittently.  Looks towards family when they call for her.  Objective   Blood pressure (!) 191/74, pulse 93, resp. rate 15, height 5\' 4"  (1.626 m), weight 80.9 kg, SpO2 94 %.    Vent Mode: PRVC FiO2 (%):  [40 %-100 %] 40 % Set Rate:  [18 bmp] 18 bmp Vt Set:  [440 mL-550 mL] 440 mL PEEP:  [5 cmH20] 5 cmH20 Plateau Pressure:  [26 cmH20] 26 cmH20  No intake or output data in the 24 hours ending 04/02/18 2055 Filed Weights   04/02/18 2006  Weight: 80.9 kg    Examination: General: Elderly female chronically ill appearing, in NAD. Neuro: Intermittently follows basic commands. HEENT: [place]/AT. Sclerae anicteric, pupils pinpoint. ETT in place. Cardiovascular: RRR, no M/R/G.  Lungs: Respirations even and unlabored.  Coarse in bases. Abdomen: BS x 4, soft, NT/ND.  Musculoskeletal: No gross deformities, no edema.  Skin: Intact, warm, no rashes.  Assessment & Plan:   Cardiac Arrest -  possibly following choking event, but not definite. Hx HTN, HLD, CAD, PAF, MI. - Cardiology consulted by EDP. - No cooling as she is following basic commands. - Trend troponin. - Assess echo. - Continue preadmission ASA, atorvastatin. - PRN lopressor and hydralazine. - Hold preadmission Imdur.  Bigeminy. - Cardiology consulted by EDP.  Respiratory insufficiency - due to inability to protect the airway in the setting of cardiac arrest. - Full vent support. - Assess ABG. - Wean as able. - Daily SBT. - Bronchial hygiene. - Follow CXR.  Possible choking / aspiration. - Empiric zosyn. - Follow cultures.  Elevated lactate - presumed due to cardiac arrest.  Possible component from aspiration. - Trend lactate.  ESRD on HD TTS - last session Thurs 9/19. Hypocalcemia. - Day team to please consult nephrology. - 1g Ca gluconate.  Hx DM. - SSI. - Hold preadmission sitagliptin, lantus,   Disposition / Summary of Today's Plan 04/02/18   ICU.    Diet: NPO. Pain/Anxiety/Delirium protocol (if indicated): Fentanyl PRN / Midazolam PRN. DVT prophylaxis: SCD's / heparin. GI prophylaxis: Famotidine. Hyperglycemia protocol: SSI. Mobility: Bedrest. Code Status: Full. Family Communication: Family updated by Dr. Jimmey Ralph.  Labs   CBC: Recent Labs  Lab 04/02/18 1918  HGB 10.5*  HCT 56.3*   Basic Metabolic Panel: Recent Labs  Lab 04/02/18 1918  NA 138  K 4.0  CL 100  GLUCOSE 276*  BUN 21  CREATININE 2.00*   GFR: Estimated Creatinine Clearance: 23.9 mL/min (A) (by C-G formula based on SCr of 2 mg/dL (H)). Recent Labs  Lab 04/02/18 1918 04/02/18 2049  LATICACIDVEN 5.11* 3.33*   Liver Function Tests: No results for input(s): AST, ALT, ALKPHOS, BILITOT, PROT, ALBUMIN in the last 168 hours. No results for input(s): LIPASE, AMYLASE in the last 168 hours. No results for input(s): AMMONIA in the last 168 hours. ABG    Component Value Date/Time   PHART 7.328 (L)  04/02/2018 1939   PCO2ART 49.3 (H) 04/02/2018 1939   PO2ART 280.0 (H) 04/02/2018 1939   HCO3 25.9 04/02/2018 1939   TCO2 27 04/02/2018 1939   ACIDBASEDEF 2.0 07/29/2011 2354   O2SAT 100.0 04/02/2018 1939    Coagulation Profile: No results for input(s): INR, PROTIME in the last 168 hours. Cardiac Enzymes: No results for input(s): CKTOTAL, CKMB, CKMBINDEX, TROPONINI in the last 168 hours. HbA1C: Hgb A1c MFr Bld  Date/Time Value Ref Range Status  11/23/2015 11:46 AM 7.1 (H) 4.8 - 5.6 % Final    Comment:    (NOTE)         Pre-diabetes: 5.7 - 6.4         Diabetes: >6.4         Glycemic control for adults with diabetes: <7.0   07/13/2015 04:30 PM 7.0 (H) 4.8 - 5.6 % Final    Comment:    (NOTE)         Pre-diabetes: 5.7 - 6.4         Diabetes: >6.4         Glycemic control for adults with diabetes: <7.0    CBG: No results for input(s): GLUCAP in the last 168 hours.  Admitting History of Present Illness.   Pt is encephelopathic; therefore, this HPI is obtained from chart review. [name] is a 77 y.o. female who  has a past medical history of Anemia, Arthritis, Atrial fibrillation ([place]), Blood transfusion, CAD (coronary artery disease), Cancer ([place]), Carotid artery occlusion, Carotid stenosis, Chronic kidney disease, Complication of anesthesia, Depression, Diabetes mellitus without complication ([place]), Dysrhythmia, General weakness (12/2015), GERD (gastroesophageal reflux disease), Glaucoma, History of hiatal hernia, History of kidney stones, History of pneumonia, HOH (hard of hearing), Hypertension, Hypokalemia (12/2015), Myocardial infarction [facility]), Neuromuscular disorder ([place]), Pneumonia, Shortness of breath, and Stroke ([place]).  Her last dialysis session was 9/19 and she completed a full dialysis treatment.  She presented to [facility] ED 9/19 after cardiac arrest at home.  Per family, she was facing away from them and heard her plate drop.  They thought she possibly had choked and when  they checked on her, they found her pulseless.  Family started CPR while awaiting EMS.  On EMS arrival, she was noted to be in V.Fib for which she was shocked x 1.  Then had asystole which required 2 minutes of CPR.  Total downtime before ROSC roughly 12 - 13 minutes.  In ED, she was intubated for airway protection.  Head CT was negative.  After 1 - 1.5 hours following intubation, she remained unresponsive (had received 140 Ketamine and 70 Rocuronium for intubation).  Prior to todays events, pt had complained of mild headache.  Otherwise no other complaints and had been in her usual state of health.  Review of Systems:   Unable to obtain as pt is encephalopathic.  Past medical history  She,  has a past medical history of Anemia, Arthritis, Atrial fibrillation ([place]), Blood transfusion, CAD (  coronary artery disease), Cancer [facility]), Carotid artery occlusion, Carotid stenosis, Chronic kidney disease, Complication of anesthesia, Depression, Diabetes mellitus without complication ([place]), Dysrhythmia, General weakness (12/2015), GERD (gastroesophageal reflux disease), Glaucoma, History of hiatal hernia, History of kidney stones, History of pneumonia, HOH (hard of hearing), Hypertension, Hypokalemia (12/2015), Myocardial infarction [facility]), Neuromuscular disorder ([place]), Pneumonia, Shortness of breath, and Stroke ([place]).   Surgical History    Past Surgical History:  Procedure Laterality Date  . A/V FISTULAGRAM N/A 07/21/2017   Procedure: A/V FISTULAGRAM - Left Arm;  Surgeon: Angelia Mould, MD;  Location: [place] CV LAB;  Service: Cardiovascular;  Laterality: N/A;  . AV FISTULA PLACEMENT Left 05/31/2014   Procedure: Creation of Left Arm arteriovenous brachiocephalic Fistula;  Surgeon: Angelia Mould, MD;  Location: [place];  Service: Vascular;  Laterality: Left;  . AV FISTULA PLACEMENT Left 09/01/2014   Procedure: INSERTION OF ARTERIOVENOUS (AV) GORE-TEX GRAFT LEFT UPPER ARM USING  4-7 MM X 45 CM  SRTETCH GORETEX GRAFT;  Surgeon: Angelia Mould, MD;  Location: [place];  Service: Vascular;  Laterality: Left;  . CARDIAC CATHETERIZATION    . CAROTID ENDARTERECTOMY Left 2009    CEA  . carpel tunnel    . COLONOSCOPY  07/25/2011   Procedure: COLONOSCOPY;  Surgeon: Winfield Cunas., MD;  Location: [facility] ENDOSCOPY;  Service: Endoscopy;  Laterality: N/A;  . CORONARY ARTERY BYPASS GRAFT  07/29/2011   Procedure: CORONARY ARTERY BYPASS GRAFTING (CABG);  Surgeon: Gaye Pollack, MD;  Location: [place];  Service: Open Heart Surgery;  Laterality: N/A;  . ENDARTERECTOMY Right 04/21/2015   Procedure: ENDARTERECTOMY CAROTID;  Surgeon: Angelia Mould, MD;  Location: [place];  Service: Vascular;  Laterality: Right;  . ESOPHAGOGASTRODUODENOSCOPY  07/25/2011   Procedure: ESOPHAGOGASTRODUODENOSCOPY (EGD);  Surgeon: Winfield Cunas., MD;  Location: [facility] ENDOSCOPY;  Service: Endoscopy;  Laterality: N/A;  . EYE SURGERY    . FISTULOGRAM Left 08/29/2014   Procedure: FISTULOGRAM;  Surgeon: Angelia Mould, MD;  Location: [facility] CATH LAB;  Service: Cardiovascular;  Laterality: Left;  . KNEE SURGERY Left   . LUMBAR LAMINECTOMY/DECOMPRESSION MICRODISCECTOMY N/A 12/01/2015   Procedure: L5-S1 Decompression and Bilateral Microdiscectomy;  Surgeon: Marybelle Killings, MD;  Location: [place];  Service: Orthopedics;  Laterality: N/A;  . NECK SURGERY    . PERIPHERAL VASCULAR BALLOON ANGIOPLASTY  07/21/2017   Procedure: PERIPHERAL VASCULAR BALLOON ANGIOPLASTY;  Surgeon: Angelia Mould, MD;  Location: [place] CV LAB;  Service: Cardiovascular;;  LT Arm AVF  . TONSILLECTOMY       Social History   Social History   Socioeconomic History  . Marital status: Married    Spouse name: Not on file  . Number of children: Not on file  . Years of education: Not on file  . Highest education level: Not on file  Occupational History  . Not on file  Social Needs  . Financial resource strain: Not on file  . Food  insecurity:    Worry: Not on file    Inability: Not on file  . Transportation needs:    Medical: Not on file    Non-medical: Not on file  Tobacco Use  . Smoking status: Never Smoker  . Smokeless tobacco: Never Used  Substance and Sexual Activity  . Alcohol use: No    Alcohol/week: 0.0 standard drinks  . Drug use: No  . Sexual activity: Never    Birth control/protection: Post-menopausal  Lifestyle  . Physical activity:  Days per week: Not on file    Minutes per session: Not on file  . Stress: Not on file  Relationships  . Social connections:    Talks on phone: Not on file    Gets together: Not on file    Attends religious service: Not on file    Active member of club or organization: Not on file    Attends meetings of clubs or organizations: Not on file    Relationship status: Not on file  . Intimate partner violence:    Fear of current or ex partner: Not on file    Emotionally abused: Not on file    Physically abused: Not on file    Forced sexual activity: Not on file  Other Topics Concern  . Not on file  Social History Narrative  . Not on file  ,  reports that she has never smoked. She has never used smokeless tobacco. She reports that she does not drink alcohol or use drugs.   Family history   Her family history includes Cancer in her father; Deep vein thrombosis in her daughter; Diabetes in her daughter and mother; Heart attack in her mother; Heart disease in her daughter and mother; Hyperlipidemia in her daughter and father; Hypertension in her daughter, father, and mother; Peripheral vascular disease in her daughter; Stroke in her mother. There is no history of Breast cancer.   Allergies Allergies  Allergen Reactions  . Gabapentin Other (See Comments)    Hallucinations and "Makes me go crazy"   . Sulfa Antibiotics Other (See Comments)    Altered mental state   . Amlodipine Other (See Comments)    "Makes my legs swell"; edema    Home meds  Prior to  Admission medications   Medication Sig Start Date End Date Taking? Authorizing Provider  acetaminophen (TYLENOL) 325 MG tablet Take 2 tablets (650 mg total) by mouth every 6 (six) hours as needed for mild pain. 12/15/15   Love, Ivan Anchors, PA-C  allopurinol (ZYLOPRIM) 100 MG tablet Take 200 mg by mouth daily.     [provider]  aspirin EC 81 MG tablet Take 1 tablet (81 mg total) by mouth daily. 09/30/17   Jettie Booze, MD  atorvastatin (LIPITOR) 10 MG tablet TAKE ONE TABLET BY MOUTH ONE TIME DAILY 02/21/14   Jettie Booze, MD  B Complex Vitamins (VITAMIN B COMPLEX PO) Take 1 tablet by mouth daily.    [provider]  brimonidine (ALPHAGAN P) 0.1 % SOLN Place 1 drop into the left eye 2 (two) times daily.    [provider]  calcitRIOL (ROCALTROL) 0.25 MCG capsule Take 0.25 mcg by mouth daily.     [provider]  Cholecalciferol (VITAMIN D) 2000 UNITS tablet Take 2,000 Units by mouth 2 (two) times daily.    [provider]  citalopram (CELEXA) 20 MG tablet Take 20 mg by mouth daily.    [provider]  colchicine 0.6 MG tablet Take 0.6 mg by mouth 2 (two) times daily.    [provider]  Cyanocobalamin (B-12) 5000 MCG CAPS Take 5,000 mcg by mouth daily.    [provider]  epoetin alfa (EPOGEN,PROCRIT) 47829 UNIT/ML injection Inject 20,000 Units into the skin every 21 ( twenty-one) days.    [provider]  furosemide (LASIX) 40 MG tablet Take 80 mg by mouth 2 (two) times daily.    [provider]  insulin glargine (LANTUS) 100 UNIT/ML injection Inject 0.2-0.3 mLs (  20-30 Units total) into the skin at bedtime. Patient taking differently: Inject 10-30 Units into the skin at bedtime. May do an additional 10 units in the morning as needed for high blood sugar 12/15/15   Love, Ivan Anchors, PA-C  isosorbide mononitrate (IMDUR) 30 MG 24 hr tablet Take 1 tablet (30 mg total) by mouth daily. 12/25/15   Hosie Poisson, MD  Menthol-Methyl Salicylate (MUSCLE RUB) 10-15 % CREA Apply 1 application topically 2 (two) times daily. 12/15/15   Love, Ivan Anchors, PA-C  metoprolol tartrate (LOPRESSOR) 25 MG tablet Take 1 tablet (25 mg total) by mouth 2 (two) times daily. 12/10/17   Jettie Booze, MD  nitroGLYCERIN (NITROSTAT) 0.4 MG SL tablet Place 0.4 mg under the tongue every 5 (five) minutes as needed. Chest pain 07/10/11   [provider]  Omega-3 Fatty Acids (FISH OIL) 1200 MG CAPS Take 1,200 mg by mouth 2 (two) times daily.    [provider]  omeprazole (PRILOSEC) 20 MG capsule Take 20 mg by mouth daily as needed (acid reflux).     [provider]  prazosin (MINIPRESS) 1 MG capsule TAKE 1 CAPSULE (1 MG TOTAL) BY MOUTH AT BEDTIME. 01/18/16   Meredith Staggers, MD  sitaGLIPtin (JANUVIA) 25 MG tablet Take 25 mg by mouth daily.  11/07/15   [provider]  timolol (TIMOPTIC) 0.5 % ophthalmic solution Place 1 drop into the left eye daily.    [provider]  travoprost, benzalkonium, (TRAVATAN) 0.004 % ophthalmic solution Place 1 drop into both eyes at bedtime.     [provider]    CC time: 40 min.   Montey Hora, [facility] Pager: [phone]  or [phone] 04/02/2018, 8:55 PM

## 2018-04-02 NOTE — ED Provider Notes (Signed)
[facility] EMERGENCY DEPARTMENT Provider Note   CSN: [id number] Arrival date & time: 04/02/18  1857     History   Chief Complaint Chief Complaint  Patient presents with  . Cardiac Arrest    HPI [name] is a 77 y.o. female.  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  The patient's family reports that she was sitting in a special seat eating her dinner.  They were not watching her eat as she was facing away from them, she suddenly dropped her plate and they found her not breathing.  The family attempted the Heimlich maneuver and this is not successful.  The patient family called EMS and they began CPR as they did not feel pulse.  When EMS arrived, within 10 minutes of their initial call, this of V. fib and defibrillated the patient the following rhythm was asystole and compressions were continued.  At that point, patient had return of spontaneous circulation.  She did not receive any epinephrine or bicarb.  The history is provided by the EMS personnel and a relative.  Cardiac Arrest  Witnessed by:  Family member Incident location:  Home Time before BLS initiated:  3-5 minutes Time before ALS initiated:  8-10 minutes Condition upon EMS arrival:  Unresponsive Pulse:  Absent Initial cardiac rhythm per EMS:  Ventricular fibrillation Treatments prior to arrival:  ACLS protocol and AED discharged Number of shocks delivered:  1 Defibrillation successful: no   Rhythm after defibrillation:  Asystole Airway:  Intubation prior to arrival Rhythm on admission to ED: bigeminy. Risk factors: diabetes mellitus and heart problem (CAD)   Risk factors: no head injury and no trauma   Risk factors comment:  ESRD   Past Medical History:  Diagnosis Date  . Anemia   . Arthritis   . Atrial fibrillation ([place])   . Blood transfusion   . CAD (coronary artery  disease)   . Cancer ([place])    .  top of head- melonoma  . Carotid artery occlusion    Carotid Endartectom,y - left 2009.  Blockage Right being watched by Dr Scot Dock.  . Carotid stenosis   . 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 Complication of anesthesia    pt. states that she was difficult to wake  . Depression   . Diabetes mellitus without complication ([place])   . Dysrhythmia   . General weakness 12/2015  . GERD (gastroesophageal reflux disease)   . Glaucoma   . History of hiatal hernia   . History of kidney stones    passed  . History of pneumonia   . HOH (hard of hearing)   . Hypertension   . Hypokalemia 12/2015  . Myocardial infarction ([place])   . Neuromuscular disorder ([place])    CARPEL TUNNEL  . Pneumonia   . Shortness of breath   . Stroke ([place])    hx of TIA    Patient Active Problem List   Diagnosis Date Noted  . QT prolongation 04/03/2018  . Cardiac arrest ([place]) 04/02/2018  . Anxiety 05/13/2017  . Panic attack 05/13/2017  . SAH (subarachnoid hemorrhage) ([place]) 02/03/2017  . Fall 02/03/2017  . Spinal stenosis, lumbar region 03/02/2016  . Trochanteric bursitis of left hip 01/03/2016  . Generalized weakness 12/21/2015  . Acute hypokalemia 12/21/2015  . Lumbar back pain with radiculopathy affecting left lower extremity 12/04/2015  .  Coronary artery disease involving native coronary artery of native heart without angina pectoris   . CRI (chronic renal insufficiency), stage 4 (severe) (HCC)   . Urinary retention   . Anemia of chronic kidney failure   . Hx of gout   . Thrombocytopenia ([place])   . S/P lumbar discectomy 12/01/2015  . 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 Acute encephalopathy   . Chronic kidney disease (CKD), stage V ([place]) 07/13/2015  . Glaucoma   . Atrial fibrillation ([facility])   . CAD (coronary artery disease)   . Depression   . Stroke ([place])   . Diabetes  mellitus without complication ([place])   . Gastroesophageal reflux disease with esophagitis   . Carotid artery stenosis 04/21/2015  . Essential hypertension 06/08/2014  . Hyperlipidemia 06/08/2014  . Occlusion and stenosis of carotid artery without mention of cerebral infarction 01/01/2012    Past Surgical History:  Procedure Laterality Date  . A/V FISTULAGRAM N/A 07/21/2017   Procedure: A/V FISTULAGRAM - Left Arm;  Surgeon: Angelia Mould, MD;  Location: [facility];  Service: Cardiovascular;  Laterality: N/A;  . AV FISTULA PLACEMENT Left 05/31/2014   Procedure: Creation of Left Arm arteriovenous brachiocephalic Fistula;  Surgeon: Angelia Mould, MD;  Location: [facility];  Service: Vascular;  Laterality: Left;  . AV FISTULA PLACEMENT Left 09/01/2014   Procedure: INSERTION OF ARTERIOVENOUS (AV) GORE-TEX GRAFT LEFT UPPER ARM USING  4-7 MM X 45 CM SRTETCH GORETEX GRAFT;  Surgeon: Angelia Mould, MD;  Location: [facility];  Service: Vascular;  Laterality: Left;  . CARDIAC CATHETERIZATION    . CAROTID ENDARTERECTOMY Left 2009    CEA  . carpel tunnel    . COLONOSCOPY  07/25/2011   Procedure: COLONOSCOPY;  Surgeon: Winfield Cunas., MD;  Location: [facility] ENDOSCOPY;  Service: Endoscopy;  Laterality: N/A;  . CORONARY ARTERY BYPASS GRAFT  07/29/2011   Procedure: CORONARY ARTERY BYPASS GRAFTING (CABG);  Surgeon: Gaye Pollack, MD;  Location: [facility];  Service: Open Heart Surgery;  Laterality: N/A;  . ENDARTERECTOMY Right 04/21/2015   Procedure: ENDARTERECTOMY CAROTID;  Surgeon: Angelia Mould, MD;  Location: [facility];  Service: Vascular;  Laterality: Right;  . ESOPHAGOGASTRODUODENOSCOPY  07/25/2011   Procedure: ESOPHAGOGASTRODUODENOSCOPY (EGD);  Surgeon: Winfield Cunas., MD;  Location: [facility] ENDOSCOPY;  Service: Endoscopy;  Laterality: N/A;  . EYE SURGERY    . FISTULOGRAM Left 08/29/2014   Procedure: FISTULOGRAM;  Surgeon: Angelia Mould, MD;  Location[facility] CATH LAB;  Service:  Cardiovascular;  Laterality: Left;  . KNEE SURGERY Left   . LUMBAR LAMINECTOMY/DECOMPRESSION MICRODISCECTOMY N/A 12/01/2015   Procedure: L5-S1 Decompression and Bilateral Microdiscectomy;  Surgeon: Marybelle Killings, MD;  Location: [facility];  Service: Orthopedics;  Laterality: N/A;  . NECK SURGERY    . PERIPHERAL VASCULAR BALLOON ANGIOPLASTY  07/21/2017   Procedure: PERIPHERAL VASCULAR BALLOON ANGIOPLASTY;  Surgeon: Angelia Mould, MD;  Location: [facility] CV LAB;  Service: Cardiovascular;;  LT Arm AVF  . TONSILLECTOMY       OB History   None      Home Medications    Prior to Admission medications   Medication Sig Start Date End Date Taking? Authorizing Provider  acetaminophen (TYLENOL) 325 MG tablet Take 2 tablets (650 mg total) by mouth every 6 (six) hours as needed for mild pain. 12/15/15  Yes Love, Ivan Anchors, PA-C  allopurinol (ZYLOPRIM) 100 MG tablet Take  200 mg by mouth daily.    Yes [provider]  ALPRAZolam (XANAX) 0.25 MG tablet Take 0.125 mg by mouth at bedtime.  03/31/18  Yes [provider]  aspirin EC 81 MG tablet Take 1 tablet (81 mg total) by mouth daily. 09/30/17  Yes Jettie Booze, MD  atorvastatin (LIPITOR) 10 MG tablet TAKE ONE TABLET BY MOUTH ONE TIME DAILY 02/21/14  Yes Jettie Booze, MD  b complex-C-folic acid 1 MG capsule Take 1 capsule by mouth daily.   Yes [provider]  brimonidine (ALPHAGAN P) 0.1 % SOLN Place 1 drop into the left eye 2 (two) times daily.   Yes [provider]  Cholecalciferol (VITAMIN D) 2000 UNITS tablet Take 2,000 Units by mouth 2 (two) times daily.   Yes [provider]  citalopram (CELEXA) 20 MG tablet Take 20 mg by mouth daily.   Yes [provider]  colchicine 0.6 MG tablet Take 0.6 mg by mouth 2 (two) times daily as needed (for gout flares).    Yes [provider]  Cyanocobalamin (B-12) 5000 MCG CAPS Take 5,000 mcg by mouth daily.   Yes [provider]    insulin glargine (LANTUS) 100 UNIT/ML injection Inject 0.2-0.3 mLs (20-30 Units total) into the skin at bedtime. Patient taking differently: Inject 10-30 Units into the skin See admin instructions. Inject 10-30 units into the skin at bedtime, per sliding scale, and may inject an additional 10 units in the morning as needed for high blood sugar 12/15/15  Yes Love, Ivan Anchors, PA-C  Menthol-Methyl Salicylate (MUSCLE RUB) 10-15 % CREA Apply 1 application topically 2 (two) times daily. Patient taking differently: Apply 1 application topically as needed for muscle pain.  12/15/15  Yes Love, Ivan Anchors, PA-C  metoprolol tartrate (LOPRESSOR) 25 MG tablet Take 1 tablet (25 mg total) by mouth 2 (two) times daily. Patient taking differently: Take 25 mg by mouth every morning.  12/10/17  Yes Jettie Booze, MD  nitroGLYCERIN (NITROSTAT) 0.4 MG SL tablet Place 0.4 mg under the tongue every 5 (five) minutes as needed for chest pain.  07/10/11  Yes [provider]  Omega-3 Fatty Acids (FISH OIL) 1200 MG CAPS Take 1,200 mg by mouth 2 (two) times daily.   Yes [provider]  omeprazole (PRILOSEC) 20 MG capsule Take 20 mg by mouth daily as needed (acid reflux).    Yes [provider]  PROCTOSOL HC 2.5 % rectal cream Place 1 application rectally 2 (two) times daily as needed for hemorrhoids.  03/31/18  Yes [provider]  sitaGLIPtin (JANUVIA) 25 MG tablet Take 25 mg by mouth daily.  11/07/15  Yes [provider]  timolol (TIMOPTIC) 0.5 % ophthalmic solution Place 1 drop into the left eye daily.   Yes [provider]  travoprost, benzalkonium, (TRAVATAN) 0.004 % ophthalmic solution Place 1 drop into both eyes at bedtime.    Yes [provider]  isosorbide mononitrate (IMDUR) 30 MG 24 hr tablet Take 1 tablet (30 mg total) by mouth daily. Patient not taking: Reported on 04/02/2018 12/25/15   Hosie Poisson, MD  prazosin (MINIPRESS) 1 MG capsule TAKE 1 CAPSULE (1  MG TOTAL) BY MOUTH AT BEDTIME. Patient not taking: Reported on 04/02/2018 01/18/16   Meredith Staggers, MD    Family History Family History  Problem Relation Age of Onset  . Diabetes Mother   . Hypertension Mother   . Heart disease Mother  beofre age 43  . Heart attack Mother   . Stroke Mother   . Cancer Father   . Hyperlipidemia Father   . Hypertension Father   . Deep vein thrombosis Daughter   . Diabetes Daughter   . Hyperlipidemia Daughter   . Hypertension Daughter   . Heart disease Daughter   . Peripheral vascular disease Daughter   . Breast cancer Neg Hx     Social History Social History   Tobacco Use  . Smoking status: Never Smoker  . Smokeless tobacco: Never Used  Substance Use Topics  . Alcohol use: No    Alcohol/week: 0.0 standard drinks  . Drug use: No     Allergies   Gabapentin; Sulfa antibiotics; and Amlodipine   Review of Systems Review of Systems  Unable to perform ROS: Mental status change     Physical Exam Updated Vital Signs BP (!) 141/53   Pulse 79   Temp 99.7 F (37.6 C) (Oral)   Resp (!) 21   Ht 5' 4"  (1.626 m)   Wt 80.9 kg   SpO2 96%   BMI 30.61 kg/m   Physical Exam  Constitutional: She appears well-developed and well-nourished.  HENT:  Head: Normocephalic and atraumatic.  ETT in place  Eyes: Pupils are equal, round, and reactive to light.  Neck: Neck supple.  Cardiovascular: Normal rate, regular rhythm and intact distal pulses.  No murmur heard. Pulmonary/Chest: No respiratory distress.  Mechanical breath sounds  Abdominal: Soft. She exhibits no distension.  Musculoskeletal: She exhibits no edema or deformity.  Skin: Skin is warm and dry.  Psychiatric: She has a normal mood and affect.  Nursing note and vitals reviewed.    ED Treatments / Results  Labs (all labs ordered are listed, but only abnormal results are displayed) Labs Reviewed  C DIFFICILE QUICK SCREEN W PCR REFLEX - Abnormal; Notable for the  following components:      Result Value   C Diff antigen POSITIVE (*)    All other components within normal limits  COMPREHENSIVE METABOLIC PANEL - Abnormal; Notable for the following components:   Glucose, Bld 269 (*)    Creatinine, Ser 1.91 (*)    Calcium 8.6 (*)    Albumin 3.1 (*)    AST 196 (*)    ALT 128 (*)    GFR calc non Af Amer 24 (*)    GFR calc Af Amer 28 (*)    All other components within normal limits  CBC WITH DIFFERENTIAL/PLATELET - Abnormal; Notable for the following components:   WBC 18.9 (*)    RBC 3.62 (*)    MCV 111.6 (*)    Neutro Abs 15.1 (*)    Monocytes Absolute 1.5 (*)    All other components within normal limits  TROPONIN I - Abnormal; Notable for the following components:   Troponin I 1.17 (*)    All other components within normal limits  LACTIC ACID, PLASMA - Abnormal; Notable for the following components:   Lactic Acid, Venous 3.4 (*)    All other components within normal limits  LACTIC ACID, PLASMA - Abnormal; Notable for the following components:   Lactic Acid, Venous 2.6 (*)    All other components within normal limits  GLUCOSE, CAPILLARY - Abnormal; Notable for the following components:   Glucose-Capillary 259 (*)    All other components within normal limits  I-STAT TROPONIN, ED - Abnormal; Notable for the following components:   Troponin i, poc 0.20 (*)  All other components within normal limits  I-STAT CHEM 8, ED - Abnormal; Notable for the following components:   Creatinine, Ser 2.00 (*)    Glucose, Bld 276 (*)    Calcium, Ion 1.01 (*)    Hemoglobin 10.5 (*)    HCT 31.0 (*)    All other components within normal limits  I-STAT CG4 LACTIC ACID, ED - Abnormal; Notable for the following components:   Lactic Acid, Venous 5.11 (*)    All other components within normal limits  I-STAT ARTERIAL BLOOD GAS, ED - Abnormal; Notable for the following components:   pH, Arterial 7.328 (*)    pCO2 arterial 49.3 (*)    pO2, Arterial 280.0 (*)     All other components within normal limits  I-STAT CG4 LACTIC ACID, ED - Abnormal; Notable for the following components:   Lactic Acid, Venous 3.33 (*)    All other components within normal limits  MRSA PCR SCREENING  URINE CULTURE  CULTURE, BLOOD (ROUTINE X 2)  CULTURE, BLOOD (ROUTINE X 2)  CULTURE, RESPIRATORY  CLOSTRIDIUM DIFFICILE BY PCR, REFLEXED  PROTIME-INR  URINALYSIS, ROUTINE W REFLEX MICROSCOPIC  TROPONIN I  CBC  BASIC METABOLIC PANEL  BLOOD GAS, ARTERIAL  MAGNESIUM  PHOSPHORUS  PROCALCITONIN  HEPARIN LEVEL (UNFRACTIONATED)  CBG MONITORING, ED    EKG EKG Interpretation  Date/Time:  Thursday April 02 2018 19:01:17 EDT Ventricular Rate:  96 PR Interval:    QRS Duration: 149 QT Interval:  458 QTC Calculation: 488 R Axis:   -84 Text Interpretation:  Sinus rhythm Ventricular bigeminy Left atrial enlargement Right bundle branch block Inferior infarct, old Baseline wander in lead(s) I II aVR aVF V1 Interpretation limited secondary to artifact Confirmed by Quintella Reichert [phone]) on 04/02/2018 7:11:17 PM   Radiology Ct Head Wo Contrast  Result Date: 04/02/2018 CLINICAL DATA:  Status post cardiac arrest with altered mental status EXAM: CT HEAD WITHOUT CONTRAST TECHNIQUE: Contiguous axial images were obtained from the base of the skull through the vertex without intravenous contrast. COMPARISON:  January 11, 2017 FINDINGS: Brain: There is age related volume loss. There is no intracranial mass, hemorrhage, extra-axial fluid collection, or midline shift. Slight periventricular small vessel disease is stable. There is no new gray-white compartment lesion. No evident acute infarct. Basal ganglia calcification is stable and felt to be physiologic in this age group. Vascular: No evident hyperdense vessel. There is calcification in the distal vertebral arteries and carotid siphon regions bilaterally. Skull: The bony calvarium appears intact. There is extensive ossification in the falx,  a stable finding. Sinuses/Orbits: There is mucosal thickening in the right maxillary antrum and to a much lesser extent in the left maxillary antrum. There is diffuse opacification of the left sphenoid sinus with extensive mucosal thickening in the right sphenoid sinus. There is a retention cyst in the posterior left ethmoid region. There is opacification of several other ethmoid air cells. Orbits appear symmetric bilaterally. Other: Mastoid air cells are clear. Hearing assist devices are present in each external auditory canal. There is probable cerumen in the right external auditory canal. There is advanced arthropathy in the temporomandibular joints with flattening of the mandibular condyles bilaterally. There is intra-articular calcification in the left temporomandibular joint. IMPRESSION: 1. Age related volume loss with slight periventricular small vessel disease. No acute infarct. No mass or hemorrhage. 2.  Multiple foci of arterial vascular calcification. 3. Extensive multifocal paranasal sinus disease. Probable cerumen in the right external auditory canal. 4. Extensive arthropathy in the temporomandibular joints,  more severe on the left than on the right. Electronically Signed   By: Lowella Grip III M.D.   On: 04/02/2018 20:04   Dg Chest Portable 1 View  Result Date: 04/02/2018 CLINICAL DATA:  Endotracheal tube adjustment EXAM: PORTABLE CHEST 1 VIEW COMPARISON:  Chest radiograph 04/02/2018 at 7:06 p.m. FINDINGS: Slight retraction of endotracheal tube, which is slightly above the carina. Retraction by 3 cm would place the tip at the level of the clavicular heads. The enteric tube tip is below the field of view. Redemonstration of multiple rib fractures. No pneumothorax. Bibasilar atelectasis. Mild cardiomegaly. IMPRESSION: Endotracheal tube tip now above the carina. Retraction by another 3 cm would place the tip at the level of the clavicular heads. Electronically Signed   By: Ulyses Jarred M.D.    On: 04/02/2018 19:49   Dg Chest Port 1 View  Result Date: 04/02/2018 CLINICAL DATA:  Post cardiac arrest EXAM: PORTABLE CHEST 1 VIEW COMPARISON:  12/20/2017, 12/04/2015 FINDINGS: Postsurgical changes of the mediastinum. Tip of the endotracheal tube encroaches the right mainstem bronchus origin. No focal opacity or pleural effusion. Mild cardiomegaly with aortic atherosclerosis. No pneumothorax is seen. Acute left first second and third rib fractures. Acute appearing right first and second rib fractures. IMPRESSION: 1. Endotracheal tube tip overlies the origin of right mainstem bronchus. Study nodes indicate that the ETT will be adjusted and a repeat image obtained. 2. Mild cardiomegaly 3. Bilateral upper rib fractures.  No definitive pneumothorax. Electronically Signed   By: Donavan Foil M.D.   On: 04/02/2018 19:26    Procedures Procedures (including critical care time)  Medications Ordered in ED Medications  metoprolol tartrate (LOPRESSOR) injection 2.5-5 mg (5 mg Intravenous Given 04/02/18 2037)  fentaNYL (SUBLIMAZE) injection 50 mcg (50 mcg Intravenous Given 04/02/18 2103)  fentaNYL (SUBLIMAZE) injection 50 mcg (50 mcg Intravenous Given 04/03/18 0055)  midazolam (VERSED) injection 1 mg (1 mg Intravenous Given 04/02/18 2313)  midazolam (VERSED) injection 1 mg (1 mg Intravenous Given 04/02/18 2102)  docusate (COLACE) 50 MG/5ML liquid 100 mg (has no administration in time range)  bisacodyl (DULCOLAX) suppository 10 mg (has no administration in time range)  0.9 %  sodium chloride infusion ( Intravenous New Bag/Given 04/02/18 2107)  0.9 %  sodium chloride infusion (has no administration in time range)  famotidine (PEPCID) IVPB 20 mg premix ( Intravenous Stopped 04/02/18 2346)  aspirin chewable tablet 81 mg (81 mg Per Tube Given 04/02/18 2314)  atorvastatin (LIPITOR) tablet 10 mg (has no administration in time range)  hydrALAZINE (APRESOLINE) injection 10-20 mg (has no administration in time range)    vancomycin (VANCOCIN) IVPB 1000 mg/200 mL premix (has no administration in time range)  piperacillin-tazobactam (ZOSYN) IVPB 3.375 g (has no administration in time range)  insulin aspart (novoLOG) injection 2-6 Units (has no administration in time range)  heparin ADULT infusion 100 units/mL (25000 units/240m sodium chloride 0.45%) (1,000 Units/hr Intravenous New Bag/Given 04/03/18 0328)  chlorhexidine gluconate (MEDLINE KIT) (PERIDEX) 0.12 % solution 15 mL (has no administration in time range)  MEDLINE mouth rinse (has no administration in time range)  piperacillin-tazobactam (ZOSYN) IVPB 3.375 g (0 g Intravenous Stopped 04/02/18 2200)  vancomycin (VANCOCIN) 1,500 mg in sodium chloride 0.9 % 500 mL IVPB (1,500 mg Intravenous New Bag/Given 04/03/18 0100)  calcium gluconate 1 g in sodium chloride 0.9 % 100 mL IVPB (1 g Intravenous New Bag/Given 04/03/18 0001)  heparin bolus via infusion 3,000 Units (3,000 Units Intravenous Bolus from Bag 04/03/18 0321)  Initial Impression / Assessment and Plan / ED Course  I have reviewed the triage vital signs and the nursing notes.  Pertinent labs & imaging results that were available during my care of the patient were reviewed by me and considered in my medical decision making (see chart for details).     The patient is a 77 year old female with past medical history of CAD and ESRD (dialysis today) who presents after a cardiac arrest.  The patient was eating dinner and was presumed to start choking.  The patient dropped her plate and her granddaughters are side found her apneic.  Betadine liquid maneuver and called 911.  They began chest compressions.  On EMS arrival, the patient was in V. fib and was defibrillated once.  The patient had resumption of chest compressions and then had ROSC.  She was intubated at the scene.  The patient has a GCS of 3 on arrival.  The patient began to become more responsive in the emergency department and was given PRN fentanyl  for pain and sedation.  The patient's labs revealed no significant electrolyte abnormalities and the patient had no evidence of a STEMI on her EKG.  The patient did have bigeminy.  The patient was found to be hypertensive, however at this time patient is not given any IV medications to lower blood pressure.  CT head reveals no intracranial bleed.  Patient admitted to the ICU for cardiac arrest and intubation.  Patient care supervised by Dr. Ralene Bathe.  Irven Baltimore, MD  Final Clinical Impressions(s) / ED Diagnoses   Final diagnoses:  None    ED Discharge Orders    None       Irven Baltimore, MD 04/03/18 4715    Quintella Reichert, MD 04/04/18 1101

## 2018-04-02 NOTE — Progress Notes (Signed)
Transported pt to CT and back to trauma B without complications. Pt suctioned prior to transport as well as on return to trauma bay. Pt tolerated well. Prior to transport ABG was obtained. PH 7.33 PCO2 49 PaO2 280 (reduced FIO2 from 100% nto 40% per protocol) HCO3 25.9  MD notified of ABG results.    RT will continue to monitor.

## 2018-04-02 NOTE — Progress Notes (Signed)
Pharmacy Antibiotic Note  KATALENA MALVEAUX is a 77 y.o. female admitted on 04/02/2018 with cardiac arrest.  Pharmacy has been consulted for vancomycin and zosyn dosing. Temperature not documented, WBC is elevated and lactic acid is elevated. Pt is on HD TTS.   Plan: Vancomycin 1500mg  IV x 1 then 1gm post-HD Zosyn 3.375gm IV Q12H (4 hr inf) F/u renal plans, C&S, clinical status and pre-HD level when appropriate  Height: 5\' 4"  (162.6 cm) Weight: 178 lb 5.6 oz (80.9 kg) IBW/kg (Calculated) : 54.7  No data recorded.  Recent Labs  Lab 04/02/18 1918  CREATININE 2.00*  LATICACIDVEN 5.11*    Estimated Creatinine Clearance: 23.9 mL/min (A) (by C-G formula based on SCr of 2 mg/dL (H)).    Allergies  Allergen Reactions  . Gabapentin Other (See Comments)    hallucinations  . Sulfa Antibiotics     Altered mental state   . Amlodipine Other (See Comments)    edema    Antimicrobials this admission: Vanc 9/19>> Zosyn 9/19>>  Dose adjustments this admission: N/A  Microbiology results: Pending  Thank you for allowing pharmacy to be a part of this patient's care.  Jaxen Samples, Rande Lawman 04/02/2018 8:22 PM

## 2018-04-02 NOTE — Consult Note (Signed)
Cardiology Consultation Note    Patient ID: [name], MRN: [id number], DOB/AGE: [birth year] 77 y.o. Admit date: 04/02/2018   Date of Consult: 04/02/2018 Primary Physician: Josetta Huddle, MD  Reason for Consultation: Cardiac arrest Requesting MD: Dr. Rosalyn Gess  HPI: [name] is a 77 y.o. female with a history of end-stage renal disease on HD, coronary artery disease status post remote bypass surgery, diabetes, who presents following witnessed cardiac arrest.  The patient was in her usual state of health today, and had an uneventful dialysis earlier in the afternoon.  She did complain of a mild headache and dizziness after her dialysis, although per report from family members, this was not unusual for her.  At no point was there any report of chest pains or shortness of breath.  She was eating dinner this evening, when her family members heard her plate fall to the ground, and found her to be unresponsive.  They called 911 and started CPR.  Upon EMS arrival initial rhythm was reportedly VF, for which she was defibrillated, but was then asystolic.  She ultimately regained spontaneous circulation following approximately 12 to 13 minutes of downtime.  She was then brought to the [facility], ED for further evaluation.  In the ED, she was intubated for airway protection with ketamine and rocuronium.  Twelve-lead ECG showed ventricular bigeminy.  There were no apparent acute ischemic changes.  Initial labs showed no notable electrolyte disturbances.  Her lactate was 5 on presentation, and initial troponin I was 0.2.  She was admitted to the pulmonary critical care service.  The patient was noted to follow basic commands, and thus a cooling protocol was deferred.  She is not currently on any vasoactive agents.  Cardiology is now consulted for aid in management of cardiac arrest.   Past Medical History:  Diagnosis Date  . Anemia   . Arthritis   . Atrial fibrillation ([place])   . Blood transfusion     . CAD (coronary artery disease)   . Cancer ([place])    .  top of head- melonoma  . Carotid artery occlusion    Carotid Endartectom,y - left 2009.  Blockage Right being watched by Dr Scot Dock.  . Carotid stenosis   . 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 Complication of anesthesia    pt. states that she was difficult to wake  . Depression   . Diabetes mellitus without complication ([place])   . Dysrhythmia   . General weakness 12/2015  . GERD (gastroesophageal reflux disease)   . Glaucoma   . History of hiatal hernia   . History of kidney stones    passed  . History of pneumonia   . HOH (hard of hearing)   . Hypertension   . Hypokalemia 12/2015  . Myocardial infarction ([place])   . Neuromuscular disorder ([place])    CARPEL TUNNEL  . Pneumonia   . Shortness of breath   . Stroke ([place])    hx of TIA      Surgical History:  Past Surgical History:  Procedure Laterality Date  . A/V FISTULAGRAM N/A 07/21/2017   Procedure: A/V FISTULAGRAM - Left Arm;  Surgeon: Angelia Mould, MD;  Location: [place] [facility];  Service: Cardiovascular;  Laterality: N/A;  . AV FISTULA PLACEMENT Left 05/31/2014   Procedure: Creation of Left Arm arteriovenous brachiocephalic Fistula;  Surgeon: Angelia Mould, MD;  Location: [place];  Service: Vascular;  Laterality: Left;  .  AV FISTULA PLACEMENT Left 09/01/2014   Procedure: INSERTION OF ARTERIOVENOUS (AV) GORE-TEX GRAFT LEFT UPPER ARM USING  4-7 MM X 45 CM SRTETCH GORETEX GRAFT;  Surgeon: Angelia Mould, MD;  Location: [place];  Service: Vascular;  Laterality: Left;  . CARDIAC CATHETERIZATION    . CAROTID ENDARTERECTOMY Left 2009    CEA  . carpel tunnel    . COLONOSCOPY  07/25/2011   Procedure: COLONOSCOPY;  Surgeon: Winfield Cunas., MD;  Location: [facility] ENDOSCOPY;  Service: Endoscopy;  Laterality: N/A;  . CORONARY ARTERY BYPASS GRAFT  07/29/2011   Procedure: CORONARY ARTERY BYPASS GRAFTING (CABG);  Surgeon: Gaye Pollack, MD;   Location: [place];  Service: Open Heart Surgery;  Laterality: N/A;  . ENDARTERECTOMY Right 04/21/2015   Procedure: ENDARTERECTOMY CAROTID;  Surgeon: Angelia Mould, MD;  Location: [place];  Service: Vascular;  Laterality: Right;  . ESOPHAGOGASTRODUODENOSCOPY  07/25/2011   Procedure: ESOPHAGOGASTRODUODENOSCOPY (EGD);  Surgeon: Winfield Cunas., MD;  Location: [facility] ENDOSCOPY;  Service: Endoscopy;  Laterality: N/A;  . EYE SURGERY    . FISTULOGRAM Left 08/29/2014   Procedure: FISTULOGRAM;  Surgeon: Angelia Mould, MD;  Location: [facility] CATH LAB;  Service: Cardiovascular;  Laterality: Left;  . KNEE SURGERY Left   . LUMBAR LAMINECTOMY/DECOMPRESSION MICRODISCECTOMY N/A 12/01/2015   Procedure: L5-S1 Decompression and Bilateral Microdiscectomy;  Surgeon: Marybelle Killings, MD;  Location: [place];  Service: Orthopedics;  Laterality: N/A;  . NECK SURGERY    . PERIPHERAL VASCULAR BALLOON ANGIOPLASTY  07/21/2017   Procedure: PERIPHERAL VASCULAR BALLOON ANGIOPLASTY;  Surgeon: Angelia Mould, MD;  Location: [place] [facility];  Service: Cardiovascular;;  LT Arm AVF  . TONSILLECTOMY       Home Meds: Prior to Admission medications   Medication Sig Start Date End Date Taking? Authorizing Provider  acetaminophen (TYLENOL) 325 MG tablet Take 2 tablets (650 mg total) by mouth every 6 (six) hours as needed for mild pain. 12/15/15  Yes Love, Ivan Anchors, PA-C  allopurinol (ZYLOPRIM) 100 MG tablet Take 200 mg by mouth daily.    Yes [provider]  ALPRAZolam (XANAX) 0.25 MG tablet Take 0.125 mg by mouth at bedtime.  03/31/18  Yes [provider]  aspirin EC 81 MG tablet Take 1 tablet (81 mg total) by mouth daily. 09/30/17  Yes Jettie Booze, MD  atorvastatin (LIPITOR) 10 MG tablet TAKE ONE TABLET BY MOUTH ONE TIME DAILY 02/21/14  Yes Jettie Booze, MD  b complex-C-folic acid 1 MG capsule Take 1 capsule by mouth daily.   Yes [provider]  brimonidine (ALPHAGAN P) 0.1 % SOLN  Place 1 drop into the left eye 2 (two) times daily.   Yes [provider]  Cholecalciferol (VITAMIN D) 2000 UNITS tablet Take 2,000 Units by mouth 2 (two) times daily.   Yes [provider]  citalopram (CELEXA) 20 MG tablet Take 20 mg by mouth daily.   Yes [provider]  colchicine 0.6 MG tablet Take 0.6 mg by mouth 2 (two) times daily as needed (for gout flares).    Yes [provider]  Cyanocobalamin (B-12) 5000 MCG CAPS Take 5,000 mcg by mouth daily.   Yes [provider]  insulin glargine (LANTUS) 100 UNIT/ML injection Inject 0.2-0.3 mLs (20-30 Units total) into the skin at bedtime. Patient taking differently: Inject 10-30 Units into the skin See admin instructions. Inject 10-30 units into the skin at bedtime, per sliding scale, and may inject an additional  10 units in the morning as needed for high blood sugar 12/15/15  Yes Love, Ivan Anchors, PA-C  Menthol-Methyl Salicylate (MUSCLE RUB) 10-15 % CREA Apply 1 application topically 2 (two) times daily. Patient taking differently: Apply 1 application topically as needed for muscle pain.  12/15/15  Yes Love, Ivan Anchors, PA-C  metoprolol tartrate (LOPRESSOR) 25 MG tablet Take 1 tablet (25 mg total) by mouth 2 (two) times daily. Patient taking differently: Take 25 mg by mouth every morning.  12/10/17  Yes Jettie Booze, MD  nitroGLYCERIN (NITROSTAT) 0.4 MG SL tablet Place 0.4 mg under the tongue every 5 (five) minutes as needed for chest pain.  07/10/11  Yes [provider]  Omega-3 Fatty Acids (FISH OIL) 1200 MG CAPS Take 1,200 mg by mouth 2 (two) times daily.   Yes [provider]  omeprazole (PRILOSEC) 20 MG capsule Take 20 mg by mouth daily as needed (acid reflux).    Yes [provider]  PROCTOSOL HC 2.5 % rectal cream Place 1 application rectally 2 (two) times daily as needed for hemorrhoids.  03/31/18  Yes [provider]  sitaGLIPtin (JANUVIA) 25 MG tablet Take 25 mg  by mouth daily.  11/07/15  Yes [provider]  timolol (TIMOPTIC) 0.5 % ophthalmic solution Place 1 drop into the left eye daily.   Yes [provider]  travoprost, benzalkonium, (TRAVATAN) 0.004 % ophthalmic solution Place 1 drop into both eyes at bedtime.    Yes [provider]  isosorbide mononitrate (IMDUR) 30 MG 24 hr tablet Take 1 tablet (30 mg total) by mouth daily. Patient not taking: Reported on 04/02/2018 12/25/15   Hosie Poisson, MD  prazosin (MINIPRESS) 1 MG capsule TAKE 1 CAPSULE (1 MG TOTAL) BY MOUTH AT BEDTIME. Patient not taking: Reported on 04/02/2018 01/18/16   Meredith Staggers, MD    Inpatient Medications:  . aspirin  81 mg Per Tube Daily  . [START ON 04/03/2018] atorvastatin  10 mg Per Tube q1800  . fentaNYL      . heparin  5,000 Units Subcutaneous Q8H  . insulin aspart  0-9 Units Subcutaneous Q4H   . sodium chloride 10 mL/hr at 04/02/18 2107  . sodium chloride    . calcium gluconate    . famotidine (PEPCID) IV    . [START ON 04/03/2018] piperacillin-tazobactam (ZOSYN)  IV    . vancomycin    . [START ON 04/04/2018] vancomycin      Allergies:  Allergies  Allergen Reactions  . Gabapentin Other (See Comments)    Hallucinations and "Makes me go crazy"   . Sulfa Antibiotics Other (See Comments)    Altered mental state   . Amlodipine Other (See Comments)    "Makes my legs swell"; edema    Social History   Socioeconomic History  . Marital status: Married    Spouse name: Not on file  . Number of children: Not on file  . Years of education: Not on file  . Highest education level: Not on file  Occupational History  . Not on file  Social Needs  . Financial resource strain: Not on file  . Food insecurity:    Worry: Not on file    Inability: Not on file  . Transportation needs:    Medical: Not on file    Non-medical: Not on file  Tobacco Use  . Smoking status: Never Smoker  . Smokeless tobacco: Never Used  Substance and Sexual  Activity  . Alcohol use: No  Alcohol/week: 0.0 standard drinks  . Drug use: No  . Sexual activity: Never    Birth control/protection: Post-menopausal  Lifestyle  . Physical activity:    Days per week: Not on file    Minutes per session: Not on file  . Stress: Not on file  Relationships  . Social connections:    Talks on phone: Not on file    Gets together: Not on file    Attends religious service: Not on file    Active member of club or organization: Not on file    Attends meetings of clubs or organizations: Not on file    Relationship status: Not on file  . Intimate partner violence:    Fear of current or ex partner: Not on file    Emotionally abused: Not on file    Physically abused: Not on file    Forced sexual activity: Not on file  Other Topics Concern  . Not on file  Social History Narrative  . Not on file     Family History  Problem Relation Age of Onset  . Diabetes Mother   . Hypertension Mother   . Heart disease Mother        beofre age 28  . Heart attack Mother   . Stroke Mother   . Cancer Father   . Hyperlipidemia Father   . Hypertension Father   . Deep vein thrombosis Daughter   . Diabetes Daughter   . Hyperlipidemia Daughter   . Hypertension Daughter   . Heart disease Daughter   . Peripheral vascular disease Daughter   . Breast cancer Neg Hx      Review of Systems: All other systems reviewed and are otherwise negative except as noted above.  Labs: No results for input(s): CKTOTAL, CKMB, TROPONINI in the last 72 hours. Lab Results  Component Value Date   WBC 18.9 (H) 04/02/2018   HGB 12.2 04/02/2018   HCT 40.4 04/02/2018   MCV 111.6 (H) 04/02/2018   PLT 204 04/02/2018    Recent Labs  Lab 04/02/18 2043  NA 140  K 3.8  CL 100  CO2 25  BUN 18  CREATININE 1.91*  CALCIUM 8.6*  PROT 6.7  BILITOT 0.7  ALKPHOS 91  ALT 128*  AST 196*  GLUCOSE 269*   Lab Results  Component Value Date   CHOL 177 07/13/2015   HDL 40 (L) 07/13/2015     LDLCALC 81 07/13/2015   TRIG 280 (H) 07/13/2015   No results found for: DDIMER  Radiology/Studies:  Ct Head Wo Contrast  Result Date: 04/02/2018 CLINICAL DATA:  Status post cardiac arrest with altered mental status EXAM: CT HEAD WITHOUT CONTRAST TECHNIQUE: Contiguous axial images were obtained from the base of the skull through the vertex without intravenous contrast. COMPARISON:  January 11, 2017 FINDINGS: Brain: There is age related volume loss. There is no intracranial mass, hemorrhage, extra-axial fluid collection, or midline shift. Slight periventricular small vessel disease is stable. There is no new gray-white compartment lesion. No evident acute infarct. Basal ganglia calcification is stable and felt to be physiologic in this age group. Vascular: No evident hyperdense vessel. There is calcification in the distal vertebral arteries and carotid siphon regions bilaterally. Skull: The bony calvarium appears intact. There is extensive ossification in the falx, a stable finding. Sinuses/Orbits: There is mucosal thickening in the right maxillary antrum and to a much lesser extent in the left maxillary antrum. There is diffuse opacification of the left sphenoid sinus with extensive  mucosal thickening in the right sphenoid sinus. There is a retention cyst in the posterior left ethmoid region. There is opacification of several other ethmoid air cells. Orbits appear symmetric bilaterally. Other: Mastoid air cells are clear. Hearing assist devices are present in each external auditory canal. There is probable cerumen in the right external auditory canal. There is advanced arthropathy in the temporomandibular joints with flattening of the mandibular condyles bilaterally. There is intra-articular calcification in the left temporomandibular joint. IMPRESSION: 1. Age related volume loss with slight periventricular small vessel disease. No acute infarct. No mass or hemorrhage. 2.  Multiple foci of arterial vascular  calcification. 3. Extensive multifocal paranasal sinus disease. Probable cerumen in the right external auditory canal. 4. 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 Electronically Signed   By: Lowella Grip III M.D.   On: 04/02/2018 20:04   Dg Chest Portable 1 View  Result Date: 04/02/2018 CLINICAL DATA:  Endotracheal tube adjustment EXAM: PORTABLE CHEST 1 VIEW COMPARISON:  Chest radiograph 04/02/2018 at 7:06 p.m. FINDINGS: Slight retraction of endotracheal tube, which is slightly above the carina. Retraction by 3 cm would place the tip at the level of the clavicular heads. The enteric tube tip is below the field of view. Redemonstration of multiple rib fractures. No pneumothorax. Bibasilar atelectasis. Mild cardiomegaly. IMPRESSION: Endotracheal tube tip now above the carina. Retraction by another 3 cm would place the tip at the level of the clavicular heads. Electronically Signed   By: Ulyses Jarred M.D.   On: 04/02/2018 19:49   Dg Chest Port 1 View  Result Date: 04/02/2018 CLINICAL DATA:  Post cardiac arrest EXAM: PORTABLE CHEST 1 VIEW COMPARISON:  12/20/2017, 12/04/2015 FINDINGS: Postsurgical changes of the mediastinum. Tip of the endotracheal tube encroaches the right mainstem bronchus origin. No focal opacity or pleural effusion. Mild cardiomegaly with aortic atherosclerosis. No pneumothorax is seen. Acute left first second and third rib fractures. Acute appearing right first and second rib fractures. IMPRESSION: 1. Endotracheal tube tip overlies the origin of right mainstem bronchus. Study nodes indicate that the ETT will be adjusted and a repeat image obtained. 2. Mild cardiomegaly 3. Bilateral upper rib fractures.  No definitive pneumothorax. Electronically Signed   By: Donavan Foil M.D.   On: 04/02/2018 19:26    Wt Readings from Last 3 Encounters:  04/02/18 80.9 kg  01/28/18 77.1 kg  01/14/18 78 kg    EKG: Ventricular bigeminy, no  acute ischemic changes  Physical Exam: Blood pressure (!) 100/47, pulse 73, resp. rate 17, height 5\' 4"  (1.626 m), weight 80.9 kg, SpO2 92 %. Body mass index is 30.61 kg/m. General: Well developed, well nourished, in no acute distress. Head: Normocephalic, atraumatic, sclera non-icteric, no xanthomas, nares are without discharge.  Neck: Negative for carotid bruits. JVD not elevated. Lungs: Mechanical breath sounds. Heart: Distant heart sounds, owing to breath sounds Abdomen: Soft, non-tender, non-distended with normoactive bowel sounds. No hepatomegaly. No rebound/guarding. No obvious abdominal masses. Msk:  Strength and tone appear normal for age. Extremities: No clubbing or cyanosis. No edema.  Distal pedal pulses are 2+ and equal bilaterally.     Assessment and Plan  77 year old lady with coronary artery disease status post CABG, end-stage renal disease on HD, who presents following a witnessed cardiac arrest.  The etiology the arrest is not entirely clear at this point, however, on aspiration event is high on the differential.  Ischemic etiologies are possible as well, especially considering that the  initial rhythm upon EMS arrival was reportedly VF.  Would recommend cycling cardiac biomarkers.  Initial mildly elevated troponin could be simply in the setting of prolonged downtime and pre-existing renal dysfunction.  If the second set of cardiac biomarkers shows a steep rate of rise, would recommend starting on IV heparin drip.  Plan for echocardiogram in the morning.  Would consider coronary angiogram at some point to redefine anatomy, although this is not emergent at this time, since there is no clear evidence that a plaque rupture event is the primary underlying etiology for her arrest.  Cardiology will continue to follow along with you.  Signed, Doylene Canning, MD 04/02/2018, 10:40 PM

## 2018-04-02 NOTE — ED Triage Notes (Signed)
Pt here from home called out for choking on ems arrival pt was found pulseless and not breathing , cpr started , pt went into vfib shocked times 1 went into asystole pt  Regained pulses after about 10 mins of cpr , 140 ketamine and 70 mg of rocc given for intubation , no other meds given

## 2018-04-03 ENCOUNTER — Inpatient Hospital Stay (HOSPITAL_COMMUNITY): Payer: Medicare Other

## 2018-04-03 ENCOUNTER — Encounter (HOSPITAL_COMMUNITY): Payer: Self-pay | Admitting: Nephrology

## 2018-04-03 DIAGNOSIS — I469 Cardiac arrest, cause unspecified: Secondary | ICD-10-CM

## 2018-04-03 DIAGNOSIS — I340 Nonrheumatic mitral (valve) insufficiency: Secondary | ICD-10-CM

## 2018-04-03 DIAGNOSIS — J690 Pneumonitis due to inhalation of food and vomit: Secondary | ICD-10-CM

## 2018-04-03 DIAGNOSIS — R9431 Abnormal electrocardiogram [ECG] [EKG]: Secondary | ICD-10-CM

## 2018-04-03 DIAGNOSIS — I160 Hypertensive urgency: Secondary | ICD-10-CM

## 2018-04-03 DIAGNOSIS — I4901 Ventricular fibrillation: Secondary | ICD-10-CM

## 2018-04-03 DIAGNOSIS — J9601 Acute respiratory failure with hypoxia: Secondary | ICD-10-CM

## 2018-04-03 DIAGNOSIS — I25709 Atherosclerosis of coronary artery bypass graft(s), unspecified, with unspecified angina pectoris: Secondary | ICD-10-CM

## 2018-04-03 DIAGNOSIS — T17908A Unspecified foreign body in respiratory tract, part unspecified causing other injury, initial encounter: Secondary | ICD-10-CM

## 2018-04-03 LAB — ECHOCARDIOGRAM COMPLETE
Height: 64 in
Weight: 2733.7 oz

## 2018-04-03 LAB — BASIC METABOLIC PANEL
Anion gap: 15 (ref 5–15)
BUN: 31 mg/dL — AB (ref 8–23)
CO2: 23 mmol/L (ref 22–32)
CREATININE: 2.42 mg/dL — AB (ref 0.44–1.00)
Calcium: 8.4 mg/dL — ABNORMAL LOW (ref 8.9–10.3)
Chloride: 102 mmol/L (ref 98–111)
GFR calc Af Amer: 21 mL/min — ABNORMAL LOW (ref >60)
GFR calc non Af Amer: 18 mL/min — ABNORMAL LOW (ref >60)
GLUCOSE: 203 mg/dL — AB (ref 70–99)
POTASSIUM: 3.8 mmol/L (ref 3.5–5.1)
Sodium: 140 mmol/L (ref 135–145)

## 2018-04-03 LAB — BLOOD GAS, ARTERIAL
Acid-base deficit: 0.2 mmol/L (ref 0.0–2.0)
Bicarbonate: 24.2 mmol/L (ref 20.0–28.0)
DRAWN BY: 51155
FIO2: 40
O2 Saturation: 95.2 %
PATIENT TEMPERATURE: 98.6
PCO2 ART: 41.1 mmHg (ref 32.0–48.0)
PEEP: 5 cmH2O
PH ART: 7.388 (ref 7.350–7.450)
PO2 ART: 85 mmHg (ref 83.0–108.0)
RATE: 18 resp/min
VT: 440 mL

## 2018-04-03 LAB — C DIFFICILE QUICK SCREEN W PCR REFLEX
C DIFFICILE (CDIFF) TOXIN: NEGATIVE
C Diff antigen: POSITIVE — AB

## 2018-04-03 LAB — CBC
HEMATOCRIT: 34.8 % — AB (ref 36.0–46.0)
Hemoglobin: 10.8 g/dL — ABNORMAL LOW (ref 12.0–15.0)
MCH: 33.4 pg (ref 26.0–34.0)
MCHC: 31 g/dL (ref 30.0–36.0)
MCV: 107.7 fL — AB (ref 78.0–100.0)
Platelets: 146 10*3/uL — ABNORMAL LOW (ref 150–400)
RBC: 3.23 MIL/uL — ABNORMAL LOW (ref 3.87–5.11)
RDW: 14.5 % (ref 11.5–15.5)
WBC: 15.9 10*3/uL — ABNORMAL HIGH (ref 4.0–10.5)

## 2018-04-03 LAB — GLUCOSE, CAPILLARY
GLUCOSE-CAPILLARY: 216 mg/dL — AB (ref 70–99)
GLUCOSE-CAPILLARY: 100 mg/dL — AB (ref 70–99)
Glucose-Capillary: 143 mg/dL — ABNORMAL HIGH (ref 70–99)
Glucose-Capillary: 149 mg/dL — ABNORMAL HIGH (ref 70–99)
Glucose-Capillary: 181 mg/dL — ABNORMAL HIGH (ref 70–99)
Glucose-Capillary: 259 mg/dL — ABNORMAL HIGH (ref 70–99)

## 2018-04-03 LAB — PROCALCITONIN: PROCALCITONIN: 1.72 ng/mL

## 2018-04-03 LAB — PHOSPHORUS: PHOSPHORUS: 2.8 mg/dL (ref 2.5–4.6)

## 2018-04-03 LAB — MAGNESIUM: Magnesium: 1.8 mg/dL (ref 1.7–2.4)

## 2018-04-03 LAB — MRSA PCR SCREENING: MRSA by PCR: NEGATIVE

## 2018-04-03 LAB — CLOSTRIDIUM DIFFICILE BY PCR, REFLEXED: CDIFFPCR: POSITIVE — AB

## 2018-04-03 LAB — LACTIC ACID, PLASMA: LACTIC ACID, VENOUS: 2.6 mmol/L — AB (ref 0.5–1.9)

## 2018-04-03 LAB — HEPARIN LEVEL (UNFRACTIONATED)
HEPARIN UNFRACTIONATED: 0.66 [IU]/mL (ref 0.30–0.70)
Heparin Unfractionated: 0.62 IU/mL (ref 0.30–0.70)

## 2018-04-03 LAB — TROPONIN I
TROPONIN I: 1.14 ng/mL — AB (ref <0.03)
Troponin I: 1.17 ng/mL (ref <0.03)

## 2018-04-03 MED ORDER — VANCOMYCIN 50 MG/ML ORAL SOLUTION
500.0000 mg | Freq: Four times a day (QID) | ORAL | Status: DC
  Administered 2018-04-03 – 2018-04-07 (×16): 500 mg via ORAL
  Filled 2018-04-03 (×18): qty 10

## 2018-04-03 MED ORDER — CHLORHEXIDINE GLUCONATE 0.12% ORAL RINSE (MEDLINE KIT)
15.0000 mL | Freq: Two times a day (BID) | OROMUCOSAL | Status: DC
  Administered 2018-04-03 – 2018-04-04 (×3): 15 mL via OROMUCOSAL

## 2018-04-03 MED ORDER — NITROGLYCERIN IN D5W 200-5 MCG/ML-% IV SOLN
0.0000 ug/min | INTRAVENOUS | Status: DC
  Administered 2018-04-03: 5 ug/min via INTRAVENOUS
  Filled 2018-04-03: qty 250

## 2018-04-03 MED ORDER — TIMOLOL MALEATE 0.5 % OP SOLN
1.0000 [drp] | Freq: Every day | OPHTHALMIC | Status: DC
  Administered 2018-04-03 – 2018-04-14 (×12): 1 [drp] via OPHTHALMIC
  Filled 2018-04-03: qty 5

## 2018-04-03 MED ORDER — INSULIN GLARGINE 100 UNIT/ML ~~LOC~~ SOLN
10.0000 [IU] | Freq: Every day | SUBCUTANEOUS | Status: DC
  Administered 2018-04-03 – 2018-04-04 (×2): 10 [IU] via SUBCUTANEOUS
  Filled 2018-04-03 (×3): qty 0.1

## 2018-04-03 MED ORDER — INSULIN ASPART 100 UNIT/ML ~~LOC~~ SOLN
2.0000 [IU] | SUBCUTANEOUS | Status: DC
  Administered 2018-04-03 (×2): 2 [IU] via SUBCUTANEOUS
  Administered 2018-04-03: 6 [IU] via SUBCUTANEOUS
  Administered 2018-04-03: 4 [IU] via SUBCUTANEOUS
  Administered 2018-04-04 (×2): 6 [IU] via SUBCUTANEOUS
  Administered 2018-04-04: 4 [IU] via SUBCUTANEOUS

## 2018-04-03 MED ORDER — HEPARIN (PORCINE) IN NACL 100-0.45 UNIT/ML-% IJ SOLN
1100.0000 [IU]/h | INTRAMUSCULAR | Status: DC
  Administered 2018-04-03 – 2018-04-05 (×3): 1000 [IU]/h via INTRAVENOUS
  Filled 2018-04-03 (×3): qty 250

## 2018-04-03 MED ORDER — MAGNESIUM SULFATE 2 GM/50ML IV SOLN: 2.0000 g | Freq: Once | INTRAVENOUS | Status: DC

## 2018-04-03 MED ORDER — VITAL HIGH PROTEIN PO LIQD: 1000.0000 mL | ORAL | Status: DC

## 2018-04-03 MED ORDER — HEPARIN BOLUS VIA INFUSION
3000.0000 [IU] | Freq: Once | INTRAVENOUS | Status: AC
  Administered 2018-04-03: 3000 [IU] via INTRAVENOUS
  Filled 2018-04-03: qty 3000

## 2018-04-03 MED ORDER — ORAL CARE MOUTH RINSE
15.0000 mL | OROMUCOSAL | Status: DC
  Administered 2018-04-03 – 2018-04-04 (×14): 15 mL via OROMUCOSAL

## 2018-04-03 MED ORDER — CHLORHEXIDINE GLUCONATE CLOTH 2 % EX PADS
6.0000 | MEDICATED_PAD | Freq: Every day | CUTANEOUS | Status: DC
  Administered 2018-04-04 – 2018-04-11 (×5): 6 via TOPICAL

## 2018-04-03 MED ORDER — VITAL HIGH PROTEIN PO LIQD
1000.0000 mL | ORAL | Status: DC
  Administered 2018-04-03: 1000 mL

## 2018-04-03 MED ORDER — LATANOPROST 0.005 % OP SOLN
1.0000 [drp] | Freq: Every day | OPHTHALMIC | Status: DC
  Administered 2018-04-03 – 2018-04-14 (×12): 1 [drp] via OPHTHALMIC
  Filled 2018-04-03: qty 2.5

## 2018-04-03 MED ORDER — VANCOMYCIN 50 MG/ML ORAL SOLUTION: 125.0000 mg | Freq: Four times a day (QID) | ORAL | Status: DC

## 2018-04-03 MED ORDER — BRIMONIDINE TARTRATE 0.15 % OP SOLN
1.0000 [drp] | Freq: Three times a day (TID) | OPHTHALMIC | Status: DC
  Administered 2018-04-03 – 2018-04-15 (×32): 1 [drp] via OPHTHALMIC
  Filled 2018-04-03: qty 5

## 2018-04-03 MED ORDER — PRO-STAT SUGAR FREE PO LIQD
30.0000 mL | Freq: Two times a day (BID) | ORAL | Status: DC
  Administered 2018-04-03: 30 mL
  Filled 2018-04-03: qty 30

## 2018-04-03 MED ORDER — MORPHINE SULFATE (PF) 2 MG/ML IV SOLN
2.0000 mg | INTRAVENOUS | Status: AC
  Administered 2018-04-03: 2 mg via INTRAVENOUS
  Filled 2018-04-03: qty 1

## 2018-04-03 MED ORDER — GERHARDT'S BUTT CREAM
TOPICAL_CREAM | CUTANEOUS | Status: DC | PRN
  Administered 2018-04-03 – 2018-04-11 (×4): 1 via TOPICAL
  Administered 2018-04-11 – 2018-04-13 (×3): via TOPICAL
  Filled 2018-04-03 (×2): qty 1

## 2018-04-03 MED ORDER — B COMPLEX-C PO TABS
1.0000 | ORAL_TABLET | Freq: Every day | ORAL | Status: DC
  Administered 2018-04-03 – 2018-04-15 (×13): 1
  Filled 2018-04-03 (×13): qty 1

## 2018-04-03 NOTE — Progress Notes (Signed)
Initial Nutrition Assessment  DOCUMENTATION CODES:   Not applicable  INTERVENTION:   Tube Feeding:  Vital High Protein @ 65 ml/hr Provides 1560 kcals, 137 g of protein and 1310 mL of free water  Add B-complex with Vitamin C (change to Rena-Vit once extubated)   NUTRITION DIAGNOSIS:   Inadequate oral intake related to acute illness, poor appetite as evidenced by NPO status.  GOAL:   Provide needs based on ASPEN/SCCM guidelines  MONITOR:   Vent status, Labs, Weight trends, TF tolerance  REASON FOR ASSESSMENT:   Consult, Ventilator Enteral/tube feeding initiation and management  ASSESSMENT:    77 yo female admitted post V.fib arrest with respiratory insufficiency requiring intubation, pneumonia post aspiration event, diarrhea with C.diff antigen +, toxin negative. PMH includes ESRD on HD, DM, HTN, HLD, CAD, PAF, MI   Patient is currently intubated on ventilator support, no sedation, not on TTM MV: 9.6 L/min Temp (24hrs), Avg:99 F (37.2 C), Min:97.9 F (36.6 C), Max:99.7 F (37.6 C)  Family at bedside; family indicates that pt has been experiencing poor appetite for while related to her progressive CKD. Pt has been on HD for 3 weeks but had a fistula access placed years ago. Pt tries to eat something 3 times a day but portions are sometimes small (like cottage cheese and crackers or a protein bar or protein shake)  Family believes pt weight to be stable reports weight at HD has been around 76 kg on arrival. EDW not charted in computer. Weight appears to be down howver over the past year. Weight around 84 kg early part of 2019, down to 81 kg and now 77.5 kg. EDW 76 kg per MD note Last HD session was yesterday prior to admission  Family indicates pt has been weak; had a fall recently, not getting around well.   +loose stool for a while per family report  Labs: Creatinine 2.42, BUN 31, CBGs 181-216, phosphorus 2.8 (Low for HD), potassium wdl Meds: ss novolog,  lantus   NUTRITION - FOCUSED PHYSICAL EXAM:    Most Recent Value  Orbital Region  No depletion  Upper Arm Region  No depletion  Thoracic and Lumbar Region  No depletion  Buccal Region  No depletion  Temple Region  No depletion  Clavicle Bone Region  No depletion  Clavicle and Acromion Bone Region  No depletion  Scapular Bone Region  No depletion  Dorsal Hand  Unable to assess  Patellar Region  Mild depletion  Anterior Thigh Region  Moderate depletion  Posterior Calf Region  Mild depletion  Edema (RD Assessment)  Mild       Diet Order:   Diet Order            Diet NPO time specified  Diet effective now              EDUCATION NEEDS:   Not appropriate for education at this time  Skin:  Skin Assessment: Reviewed RN Assessment  Last BM:  9/20  Height:   Ht Readings from Last 1 Encounters:  04/02/18 5\' 4"  (1.626 m)    Weight:   Wt Readings from Last 1 Encounters:  04/03/18 77.5 kg    Ideal Body Weight:     BMI:  Body mass index is 29.33 kg/m.  Estimated Nutritional Needs:   Kcal:  1546 kcals   Protein:  114-145 g  Fluid:  1000 mL plus UOP   [employer] MS, RD, LDN, CNSC [phone] Pager  [phone] Weekend/On-Call  Pager

## 2018-04-03 NOTE — Progress Notes (Addendum)
NAME:  [name], MRN:  [id number], DOB:  Aug 15, 1940, LOS: 1 ADMISSION DATE:  04/02/2018, CONSULTATION DATE:  04/02/18 REFERRING MD:  Ralene Bathe CHIEF COMPLAINT:  Cardiac Arrest   Brief History   [name] is a 77 y.o. female who has extensive PMH, presented to [facility] ED 9/19 after cardiac arrest at home possibly following a choking event (family heard her plate drop and thought she might have choked).  Family started CPR while awaiting EMS.  On EMS arrival, she was shocked once for VF before developing asystole.  Total of roughly 12 - 13 minutes of downtime prior to ROSC (including time that family provided CPR).  Significant Hospital Events   9/19 > admit. 9/20: Stable overnight however troponins did rise slightly.  Having loose stools, reporting chest discomfort to palpation.  Starting oral Vanco for C. difficile antigen positive, diarrhea, and concomitant antibiotic administration.  Consults: date of consult/date signed off & final recs:  Cardiology, 9/19 >  Nephrology consulted 9/20 Procedures (surgical and bedside):  ETT 9/19 >   Significant Diagnostic Tests:  CT head 9/19 > age related volume loss. Echo 9/20 >   Micro Data: Blood 9/19 >  Sputum 9/19: Few gram-positive cocci few yeast>>> C. difficile: Antigen positive toxin negative obtained on 9/20 Antimicrobials:  Zosyn 9/19 >  Vancomycin 9/19 Oral vancomycin 9/20 Subjective:  Reports chest pain to palpation  Objective   Blood pressure (Abnormal) 120/47, pulse 66, temperature 99.1 F (37.3 C), temperature source Oral, resp. rate (Abnormal) 23, height 5\' 4"  (1.626 m), weight 77.5 kg, SpO2 96 %.    Vent Mode: PRVC FiO2 (%):  [40 %-100 %] 40 % Set Rate:  [18 bmp] 18 bmp Vt Set:  [440 mL-550 mL] 440 mL PEEP:  [5 cmH20] 5 cmH20 Plateau Pressure:  [16 cmH20-26 cmH20] 16 cmH20   Intake/Output Summary (Last 24 hours) at 04/03/2018 0935 Last data filed at 04/03/2018 0800 Gross per 24 hour  Intake 792.32 ml  Output 350 ml    Net 442.32 ml   Filed Weights   04/02/18 2006 04/03/18 0500  Weight: 80.9 kg 77.5 kg    Examination: General: Chronically ill 77 year old white female currently mechanically ventilated no acute distress HEENT normocephalic atraumatic no jugular venous distention orally intubated pupils equal and reactive Pulmonary: Clear to auscultation diminished bases Cardiac: Regular rate and rhythm Chest: Winces and reports pain to palpation of chest particularly over sternum Abdomen: Soft, nontender.  Liquid stool from Flexi-Seal GU: Clear yellow Muscular skeletal: Equal strength and bulk no edema Neuro: Awake, follows commands, interactive.   Assessment & Plan:   Ventricular fibrillation cardiac Arrest - possibly following choking event, but not definite. Hx HTN, HLD, CAD, PAF, MI. -Having chest pain but seems primarily secondary to CPR -PACs and bigeminy improved Plan Continue aspirin and atorvastatin IV heparin Trend troponins Follow-up echocardiogram Will likely need ischemia evaluation   Respiratory insufficiency - due to inability to protect the airway in the setting of cardiac arrest. -Arterial blood gas reviewed Plan Full ventilator support VAP bundle A.m. chest x-ray Hold off from weaning until resolution of chest pain and downtrend of cardiac enzymes Anticipate SBT on 9/21, may need dialysis prior to successful extubation  Aspiration pneumonia -Portable chest x-ray personally reviewed: Mildly improved right basilar airspace disease.  Endotracheal tube in satisfactory position -White blood cell count coming down -Sputum culture still pending Plan Day #2 vancomycin and Zosyn Follow-up pending sputum cultures Trend CBC  Diarrhea.  C. difficile antigen positive  toxin negative. -In light of ongoing antibiotic support and diarrhea we will treat this Plan Start vancomycin via tube  Elevated lactate - presumed due to cardiac arrest.  Trended down Plan Discontinue  lactic acid monitoring  ESRD on HD TTS - last session Thurs 9/19. Plan Will consult nephrology Will likely need dialysis prior to extubation  Intermittent fluid and electrolyte imbalance: Hypocalcemia but this was replaced. Plan Follow-up a.m. Chemistry  Hx DM. -Still hyperglycemic Plan Holding preadmission meds Sliding scale insulin Add low-dose Lantus  Anemia of chronic disease no evidence of bleeding Mild thrombocytopenia Plan Serial CBCs  Disposition / Summary of Today's Plan 04/03/18   Clinically stable.  And seems to be trending down.  She had some chest pain, but on further evaluation this is likely musculoskeletal following CPR.  Plan for today is to treat supportively, treat pneumonia, start tube feeds, consult nephrology as well need dialysis on 9/21.  Like to see troponins continue to normalize, anticipate will work towards spontaneous breathing trial on 9/21 assuming today is uneventful    Diet: NPO.  Will start tube feeds 9/20 Pain/Anxiety/Delirium protocol (if indicated): Fentanyl PRN / Midazolam PRN. DVT prophylaxis: Systemic heparin GI prophylaxis: Famotidine. Hyperglycemia protocol: SSI. Mobility: Bedrest. Code Status: Full. Family Communication: Husband and granddaughter were updated at bedside  Labs   CBC: Recent Labs  Lab 04/02/18 1918 04/02/18 2043 04/03/18 0605  WBC  --  18.9* 15.9*  NEUTROABS  --  15.1*  --   HGB 10.5* 12.2 10.8*  HCT 31.0* 40.4 34.8*  MCV  --  111.6* 107.7*  PLT  --  204 950*   Basic Metabolic Panel: Recent Labs  Lab 04/02/18 1918 04/02/18 2043 04/03/18 0605  NA 138 140 140  K 4.0 3.8 3.8  CL 100 100 102  CO2  --  25 23  GLUCOSE 276* 269* 203*  BUN 21 18 31*  CREATININE 2.00* 1.91* 2.42*  CALCIUM  --  8.6* 8.4*  MG  --   --  1.8  PHOS  --   --  2.8   GFR: Estimated Creatinine Clearance: 19.3 mL/min (A) (by C-G formula based on SCr of 2.42 mg/dL (H)). Recent Labs  Lab 04/02/18 1918 04/02/18 2043  04/02/18 2044 04/02/18 2049 04/03/18 0054 04/03/18 0605  WBC  --  18.9*  --   --   --  15.9*  LATICACIDVEN 5.11*  --  3.4* 3.33* 2.6*  --    Liver Function Tests: Recent Labs  Lab 04/02/18 2043  AST 196*  ALT 128*  ALKPHOS 91  BILITOT 0.7  PROT 6.7  ALBUMIN 3.1*   No results for input(s): LIPASE, AMYLASE in the last 168 hours. No results for input(s): AMMONIA in the last 168 hours. ABG    Component Value Date/Time   PHART 7.388 04/03/2018 0406   PCO2ART 41.1 04/03/2018 0406   PO2ART 85.0 04/03/2018 0406   HCO3 24.2 04/03/2018 0406   TCO2 27 04/02/2018 1939   ACIDBASEDEF 0.2 04/03/2018 0406   O2SAT 95.2 04/03/2018 0406    Coagulation Profile: Recent Labs  Lab 04/02/18 2043  INR 1.04   Cardiac Enzymes: Recent Labs  Lab 04/03/18 0054 04/03/18 0605  TROPONINI 1.17* 1.14*   HbA1C: Hgb A1c MFr Bld  Date/Time Value Ref Range Status  11/23/2015 11:46 AM 7.1 (H) 4.8 - 5.6 % Final    Comment:    (NOTE)         Pre-diabetes: 5.7 - 6.4  Diabetes: >6.4         Glycemic control for adults with diabetes: <7.0   07/13/2015 04:30 PM 7.0 (H) 4.8 - 5.6 % Final    Comment:    (NOTE)         Pre-diabetes: 5.7 - 6.4         Diabetes: >6.4         Glycemic control for adults with diabetes: <7.0    CBG: Recent Labs  Lab 04/03/18 0002 04/03/18 0448 04/03/18 0732  GLUCAP 259* 216* [place] ACNP-BC [place] Pager # [phone] OR # [phone] if no answer 45 minutes ccm time

## 2018-04-03 NOTE — Progress Notes (Signed)
ANTICOAGULATION CONSULT NOTE - Initial Consult  Pharmacy Consult for heparin Indication: chest pain/ACS  Allergies  Allergen Reactions  . Gabapentin Other (See Comments)    Hallucinations and "Makes me go crazy"   . Sulfa Antibiotics Other (See Comments)    Altered mental state   . Amlodipine Other (See Comments)    "Makes my legs swell"; edema    Patient Measurements: Height: 5\' 4"  (162.6 cm) Weight: 178 lb 5.6 oz (80.9 kg) IBW/kg (Calculated) : 54.7 Heparin Dosing Weight: 72.1 kg  Vital Signs: BP: 168/65 (09/20 0200) Pulse Rate: 79 (09/20 0200)  Labs: Recent Labs    04/02/18 1918 04/02/18 2043 04/03/18 0054  HGB 10.5* 12.2  --   HCT 31.0* 40.4  --   PLT  --  204  --   LABPROT  --  13.5  --   INR  --  1.04  --   CREATININE 2.00* 1.91*  --   TROPONINI  --   --  1.17*    Estimated Creatinine Clearance: 25 mL/min (A) (by C-G formula based on SCr of 1.91 mg/dL (H)).   Medical History: Past Medical History:  Diagnosis Date  . Anemia   . Arthritis   . Atrial fibrillation ([place])   . Blood transfusion   . CAD (coronary artery disease)   . Cancer ([place])    .  top of head- melonoma  . Carotid artery occlusion    Carotid Endartectom,y - left 2009.  Blockage Right being watched by Dr Scot Dock.  . Carotid stenosis   . 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 Complication of anesthesia    pt. states that she was difficult to wake  . Depression   . Diabetes mellitus without complication ([place])   . Dysrhythmia   . General weakness 12/2015  . GERD (gastroesophageal reflux disease)   . Glaucoma   . History of hiatal hernia   . History of kidney stones    passed  . History of pneumonia   . HOH (hard of hearing)   . Hypertension   . Hypokalemia 12/2015  . Myocardial infarction ([place])   . 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 Pneumonia   . Shortness of breath   . Stroke ([place])    hx of TIA    Medications:  Medications Prior to Admission   Medication Sig Dispense Refill Last Dose  . acetaminophen (TYLENOL) 325 MG tablet Take 2 tablets (650 mg total) by mouth every 6 (six) hours as needed for mild pain.   Unk at [facility]  . allopurinol (ZYLOPRIM) 100 MG tablet Take 200 mg by mouth daily.    04/02/2018 at Unknown time  . ALPRAZolam (XANAX) 0.25 MG tablet Take 0.125 mg by mouth at bedtime.   0 04/01/2018 at pm  . aspirin EC 81 MG tablet Take 1 tablet (81 mg total) by mouth daily. 90 tablet 3 04/02/2018 at 0800  . atorvastatin (LIPITOR) 10 MG tablet TAKE ONE TABLET BY MOUTH ONE TIME DAILY 30 tablet 0 04/02/2018 at Unknown time  . b complex-C-folic acid 1 MG capsule Take 1 capsule by mouth daily.   04/02/2018 at Unknown time  . brimonidine (ALPHAGAN P) 0.1 % SOLN Place 1 drop into the left eye 2 (two) times daily.   04/02/2018 at Unknown time  . Cholecalciferol (VITAMIN D) 2000 UNITS tablet Take 2,000 Units by mouth 2 (two) times daily.   04/02/2018 at Unknown time  . citalopram (  CELEXA) 20 MG tablet Take 20 mg by mouth daily.   04/02/2018 at Unknown time  . colchicine 0.6 MG tablet Take 0.6 mg by mouth 2 (two) times daily as needed (for gout flares).    Unk at [employer]  . Cyanocobalamin (B-12) 5000 MCG CAPS Take 5,000 mcg by mouth daily.   04/02/2018 at Unknown time  . insulin glargine (LANTUS) 100 UNIT/ML injection Inject 0.2-0.3 mLs (20-30 Units total) into the skin at bedtime. (Patient taking differently: Inject 10-30 Units into the skin See admin instructions. Inject 10-30 units into the skin at bedtime, per sliding scale, and may inject an additional 10 units in the morning as needed for high blood sugar) 10 mL 11 04/01/2018 at pm  . Menthol-Methyl Salicylate (MUSCLE RUB) 10-15 % CREA Apply 1 application topically 2 (two) times daily. (Patient taking differently: Apply 1 application topically as needed for muscle pain. )  0 Unk at Unk  . metoprolol tartrate (LOPRESSOR) 25 MG tablet Take 1 tablet (25 mg total) by mouth 2 (two) times daily. (Patient taking  differently: Take 25 mg by mouth every morning. ) 180 tablet 2 04/02/2018 at 0900  . nitroGLYCERIN (NITROSTAT) 0.4 MG SL tablet Place 0.4 mg under the tongue every 5 (five) minutes as needed for chest pain.    Unk at [employer]  . Omega-3 Fatty Acids (FISH OIL) 1200 MG CAPS Take 1,200 mg by mouth 2 (two) times daily.   04/02/2018 at am  . omeprazole (PRILOSEC) 20 MG capsule Take 20 mg by mouth daily as needed (acid reflux).    Unk at [employer]  . PROCTOSOL HC 2.5 % rectal cream Place 1 application rectally 2 (two) times daily as needed for hemorrhoids.   1 Past Week at Unknown time  . sitaGLIPtin (JANUVIA) 25 MG tablet Take 25 mg by mouth daily.    04/02/2018 at am  . timolol (TIMOPTIC) 0.5 % ophthalmic solution Place 1 drop into the left eye daily.   04/02/2018 at am  . travoprost, benzalkonium, (TRAVATAN) 0.004 % ophthalmic solution Place 1 drop into both eyes at bedtime.    04/01/2018 at pm  . isosorbide mononitrate (IMDUR) 30 MG 24 hr tablet Take 1 tablet (30 mg total) by mouth daily. (Patient not taking: Reported on 04/02/2018) 30 tablet 0 Not Taking at Unknown time  . prazosin (MINIPRESS) 1 MG capsule TAKE 1 CAPSULE (1 MG TOTAL) BY MOUTH AT BEDTIME. (Patient not taking: Reported on 04/02/2018) 30 capsule 1 Not Taking at Unknown time    Assessment: 77 yo lady s/p cardiac arrest to start heparin.  She was not on anticoagulation PTA but did receive heparin 5000 units sq @ 23:14. Baseline Hg and PTLC wnl.   Goal of Therapy:  Heparin level 0.3-0.7 units/ml Monitor platelets by anticoagulation protocol: Yes   Plan:  Heparin 3000 unit bolus and drip at 1000 units/hr Check heparin level 8 hours after start and daily while on heparin Monitor for bleeding complications  [name] 04/03/2018,2:37 AM

## 2018-04-03 NOTE — Progress Notes (Signed)
  Echocardiogram 2D Echocardiogram has been performed.  [name] 04/03/2018, 1:03 PM

## 2018-04-03 NOTE — Progress Notes (Signed)
ANTICOAGULATION CONSULT NOTE - [place] for heparin Indication: chest pain/ACS  Allergies  Allergen Reactions  . Gabapentin Other (See Comments)    Hallucinations and "Makes me go crazy"   . Sulfa Antibiotics Other (See Comments)    Altered mental state   . Amlodipine Other (See Comments)    "Makes my legs swell"; edema    Patient Measurements: Height: 5\' 4"  (162.6 cm) Weight: 170 lb 13.7 oz (77.5 kg) IBW/kg (Calculated) : 54.7 Heparin Dosing Weight: 72.1 kg  Vital Signs: Temp: 98.9 F (37.2 C) (09/20 1130) Temp Source: Oral (09/20 1130) BP: 118/43 (09/20 1245) Pulse Rate: 66 (09/20 1245)  Labs: Recent Labs    04/02/18 1918 04/02/18 2043 04/03/18 0054 04/03/18 0605 04/03/18 1235  HGB 10.5* 12.2  --  10.8*  --   HCT 31.0* 40.4  --  34.8*  --   PLT  --  204  --  146*  --   LABPROT  --  13.5  --   --   --   INR  --  1.04  --   --   --   HEPARINUNFRC  --   --   --   --  0.66  CREATININE 2.00* 1.91*  --  2.42*  --   TROPONINI  --   --  1.17* 1.14*  --     Estimated Creatinine Clearance: 19.3 mL/min (A) (by C-G formula based on SCr of 2.42 mg/dL (H)).   Medical History: Past Medical History:  Diagnosis Date  . Anemia   . Arthritis   . Atrial fibrillation ([place])   . Blood transfusion   . CAD (coronary artery disease)   . Cancer ([place])    .  top of head- melonoma  . Carotid artery occlusion    Carotid Endartectom,y - left 2009.  Blockage Right being watched by Dr Scot Dock.  . Carotid stenosis   . 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 Complication of anesthesia    pt. states that she was difficult to wake  . Depression   . Diabetes mellitus without complication ([place])   . Dysrhythmia   . General weakness 12/2015  . GERD (gastroesophageal reflux disease)   . Glaucoma   . History of hiatal hernia   . History of kidney stones    passed  . History of pneumonia   . HOH (hard of hearing)   . Hypertension   .  Hypokalemia 12/2015  . Myocardial infarction ([place])   . 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 Pneumonia   . Shortness of breath   . Stroke ([place])    hx of TIA    Medications:  Medications Prior to Admission  Medication Sig Dispense Refill Last Dose  . acetaminophen (TYLENOL) 325 MG tablet Take 2 tablets (650 mg total) by mouth every 6 (six) hours as needed for mild pain.   Unk at [facility]  . allopurinol (ZYLOPRIM) 100 MG tablet Take 200 mg by mouth daily.    04/02/2018 at Unknown time  . ALPRAZolam (XANAX) 0.25 MG tablet Take 0.125 mg by mouth at bedtime.   0 04/01/2018 at pm  . aspirin EC 81 MG tablet Take 1 tablet (81 mg total) by mouth daily. 90 tablet 3 04/02/2018 at 0800  . atorvastatin (LIPITOR) 10 MG tablet TAKE ONE TABLET BY MOUTH ONE TIME DAILY 30 tablet 0 04/02/2018 at Unknown time  . b complex-C-folic acid 1 MG capsule  Take 1 capsule by mouth daily.   04/02/2018 at Unknown time  . brimonidine (ALPHAGAN P) 0.1 % SOLN Place 1 drop into the left eye 2 (two) times daily.   04/02/2018 at Unknown time  . Cholecalciferol (VITAMIN D) 2000 UNITS tablet Take 2,000 Units by mouth 2 (two) times daily.   04/02/2018 at Unknown time  . citalopram (CELEXA) 20 MG tablet Take 20 mg by mouth daily.   04/02/2018 at Unknown time  . colchicine 0.6 MG tablet Take 0.6 mg by mouth 2 (two) times daily as needed (for gout flares).    Unk at [employer]  . Cyanocobalamin (B-12) 5000 MCG CAPS Take 5,000 mcg by mouth daily.   04/02/2018 at Unknown time  . insulin glargine (LANTUS) 100 UNIT/ML injection Inject 0.2-0.3 mLs (20-30 Units total) into the skin at bedtime. (Patient taking differently: Inject 10-30 Units into the skin See admin instructions. Inject 10-30 units into the skin at bedtime, per sliding scale, and may inject an additional 10 units in the morning as needed for high blood sugar) 10 mL 11 04/01/2018 at pm  . Menthol-Methyl Salicylate (MUSCLE RUB) 10-15 % CREA Apply 1 application topically 2 (two) times  daily. (Patient taking differently: Apply 1 application topically as needed for muscle pain. )  0 Unk at Unk  . metoprolol tartrate (LOPRESSOR) 25 MG tablet Take 1 tablet (25 mg total) by mouth 2 (two) times daily. (Patient taking differently: Take 25 mg by mouth every morning. ) 180 tablet 2 04/02/2018 at 0900  . nitroGLYCERIN (NITROSTAT) 0.4 MG SL tablet Place 0.4 mg under the tongue every 5 (five) minutes as needed for chest pain.    Unk at [employer]  . Omega-3 Fatty Acids (FISH OIL) 1200 MG CAPS Take 1,200 mg by mouth 2 (two) times daily.   04/02/2018 at am  . omeprazole (PRILOSEC) 20 MG capsule Take 20 mg by mouth daily as needed (acid reflux).    Unk at [employer]  . PROCTOSOL HC 2.5 % rectal cream Place 1 application rectally 2 (two) times daily as needed for hemorrhoids.   1 Past Week at Unknown time  . sitaGLIPtin (JANUVIA) 25 MG tablet Take 25 mg by mouth daily.    04/02/2018 at am  . timolol (TIMOPTIC) 0.5 % ophthalmic solution Place 1 drop into the left eye daily.   04/02/2018 at am  . travoprost, benzalkonium, (TRAVATAN) 0.004 % ophthalmic solution Place 1 drop into both eyes at bedtime.    04/01/2018 at pm  . isosorbide mononitrate (IMDUR) 30 MG 24 hr tablet Take 1 tablet (30 mg total) by mouth daily. (Patient not taking: Reported on 04/02/2018) 30 tablet 0 Not Taking at Unknown time  . prazosin (MINIPRESS) 1 MG capsule TAKE 1 CAPSULE (1 MG TOTAL) BY MOUTH AT BEDTIME. (Patient not taking: Reported on 04/02/2018) 30 capsule 1 Not Taking at Unknown time    Assessment: 29 yoF s/p cardiac arrest on IV heparin for ACS r/o. Initial heparin level therapeutic at 1000 units/hr. CBC stable, trops up. Cardiology planning ischemic eval depending upon neurologic recovery.   Goal of Therapy:  Heparin level 0.3-0.7 units/ml Monitor platelets by anticoagulation protocol: Yes   Plan:  -Continue IV heparin 1000 units/hr -Check 8hr heparin level to confirm -Daily heparin level, CBC  Arrie Senate, PharmD,  BCPS Clinical Pharmacist [phone] Please check AMION for all [place] numbers 04/03/2018

## 2018-04-03 NOTE — Progress Notes (Signed)
Progress Note  Patient Name: [name] Date of Encounter: 04/03/2018  Primary Cardiologist: Larae Grooms, MD   Subjective   Opens eyes to voice and seems to have some purposeful movements, although still stuporous.  Inpatient Medications    Scheduled Meds: . aspirin  81 mg Per Tube Daily  . atorvastatin  10 mg Per Tube q1800  . chlorhexidine gluconate (MEDLINE KIT)  15 mL Mouth Rinse BID  . insulin aspart  2-6 Units Subcutaneous Q4H  . mouth rinse  15 mL Mouth Rinse 10 times per day   Continuous Infusions: . sodium chloride 10 mL/hr at 04/02/18 2107  . sodium chloride    . famotidine (PEPCID) IV Stopped (04/02/18 2346)  . heparin 1,000 Units/hr (04/03/18 0600)  . piperacillin-tazobactam (ZOSYN)  IV 3.375 g (04/03/18 0728)  . [START ON 04/04/2018] vancomycin     PRN Meds: sodium chloride, bisacodyl, docusate, fentaNYL (SUBLIMAZE) injection, fentaNYL (SUBLIMAZE) injection, hydrALAZINE, metoprolol tartrate, midazolam, midazolam   Vital Signs    Vitals:   04/03/18 0400 04/03/18 0500 04/03/18 0600 04/03/18 0700  BP: (!) 114/55 (!) 152/61 (!) 129/47 (!) 108/44  Pulse: 73 80 71 66  Resp: (!) 0 16 10 11   Temp:  99.4 F (37.4 C)    TempSrc:  Oral    SpO2: 96% 97% 96% 96%  Weight:  77.5 kg    Height:        Intake/Output Summary (Last 24 hours) at 04/03/2018 0813 Last data filed at 04/03/2018 0600 Gross per 24 hour  Intake 736.05 ml  Output 350 ml  Net 386.05 ml   Filed Weights   04/02/18 2006 04/03/18 0500  Weight: 80.9 kg 77.5 kg    Telemetry    Sinus rhythm- Personally Reviewed  ECG    No new tracing; yesterday's ECG shows sinus rhythm and 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   Neck: No JVD Cardiac:  RRR, widely split second heart sound, no murmurs, rubs, or gallops.  Left arm AV fistula with good thrill/bruit Respiratory: Clear to auscultation bilaterally. GI: Soft, nontender, non-distended  MS: No edema; No deformity. Neuro:  Nonfocal no nominate anybody not me to be sure she is her name is still Psych: Normal affect   Labs    Chemistry Recent Labs  Lab 04/02/18 1918 04/02/18 2043  NA 138 140  K 4.0 3.8  CL 100 100  CO2  --  25  GLUCOSE 276* 269*  BUN 21 18  CREATININE 2.00* 1.91*  CALCIUM  --  8.6*  PROT  --  6.7  ALBUMIN  --  3.1*  AST  --  196*  ALT  --  128*  ALKPHOS  --  91  BILITOT  --  0.7  GFRNONAA  --  24*  GFRAA  --  28*  ANIONGAP  --  15     Hematology Recent Labs  Lab 04/02/18 1918 04/02/18 2043 04/03/18 0605  WBC  --  18.9* 15.9*  RBC  --  3.62* 3.23*  HGB 10.5* 12.2 10.8*  HCT 31.0* 40.4 34.8*  MCV  --  111.6* 107.7*  MCH  --  33.7 33.4  MCHC  --  30.2 31.0  RDW  --  14.6 14.5  PLT  --  204  146*    Cardiac Enzymes Recent Labs  Lab 04/03/18 0054  TROPONINI 1.17*    Recent Labs  Lab 04/02/18 1916  TROPIPOC 0.20*     BNPNo results for input(s): BNP, PROBNP in the last 168 hours.   DDimer No results for input(s): DDIMER in the last 168 hours.   Radiology    Ct Head Wo Contrast  Result Date: 04/02/2018 CLINICAL DATA:  Status post cardiac arrest with altered mental status EXAM: CT HEAD WITHOUT CONTRAST TECHNIQUE: Contiguous axial images were obtained from the base of the skull through the vertex without intravenous contrast. COMPARISON:  January 11, 2017 FINDINGS: Brain: There is age related volume loss. There is no intracranial mass, hemorrhage, extra-axial fluid collection, or midline shift. Slight periventricular small vessel disease is stable. There is no new gray-white compartment lesion. No evident acute infarct. Basal ganglia calcification is stable and felt to be physiologic in this age group. Vascular: No evident hyperdense  vessel. There is calcification in the distal vertebral arteries and carotid siphon regions bilaterally. Skull: The bony calvarium appears intact. There is extensive ossification in the falx, a stable finding. Sinuses/Orbits: There is mucosal thickening in the right maxillary antrum and to a much lesser extent in the left maxillary antrum. There is diffuse opacification of the left sphenoid sinus with extensive mucosal thickening in the right sphenoid sinus. There is a retention cyst in the posterior left ethmoid region. There is opacification of several other ethmoid air cells. Orbits appear symmetric bilaterally. Other: Mastoid air cells are clear. Hearing assist devices are present in each external auditory canal. There is probable cerumen in the right external auditory canal. There is advanced arthropathy in the temporomandibular joints with flattening of the mandibular condyles bilaterally. There is intra-articular calcification in the left temporomandibular joint. IMPRESSION: 1. Age related volume loss with slight periventricular small vessel disease. No acute infarct. No mass or hemorrhage. 2.  Multiple foci of arterial vascular calcification. 3. Extensive multifocal paranasal sinus disease. Probable cerumen in the right external auditory canal. 4. 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 Electronically Signed   By: Lowella Grip III M.D.   On: 04/02/2018 20:04   Dg Chest Portable 1 View  Result Date: 04/02/2018 CLINICAL DATA:  Endotracheal tube adjustment EXAM: PORTABLE CHEST 1 VIEW COMPARISON:  Chest radiograph 04/02/2018 at 7:06 p.m. FINDINGS: Slight retraction of endotracheal tube, which is slightly above the carina. Retraction by 3 cm would place the tip at the level of the clavicular heads. The enteric tube tip is below the field of view. Redemonstration of multiple rib fractures. No pneumothorax. Bibasilar atelectasis. Mild cardiomegaly.  IMPRESSION: Endotracheal tube tip now above the carina. Retraction by another 3 cm would place the tip at the level of the clavicular heads. Electronically Signed   By: Ulyses Jarred M.D.   On: 04/02/2018 19:49   Dg Chest Port 1 View  Result Date: 04/02/2018 CLINICAL DATA:  Post cardiac arrest EXAM: PORTABLE CHEST 1 VIEW COMPARISON:  12/20/2017, 12/04/2015 FINDINGS: Postsurgical changes of the mediastinum. Tip of the endotracheal tube encroaches the right mainstem bronchus origin. No focal opacity or pleural effusion. Mild cardiomegaly with aortic atherosclerosis. No pneumothorax is seen. Acute left first second and third rib fractures. Acute appearing right first and second rib fractures. IMPRESSION: 1. Endotracheal tube tip overlies the origin of right mainstem bronchus. Study nodes indicate that the ETT will be adjusted and a repeat image obtained. 2. Mild cardiomegaly  3. Bilateral upper rib fractures.  No definitive pneumothorax. Electronically Signed   By: Donavan Foil M.D.   On: 04/02/2018 19:26    Cardiac Studies   Echo 2016 - Left ventricle: The cavity size was normal. Wall thickness was normal. Systolic function was normal. The estimated ejection fraction was in the range of 55% to 60%. - Mitral valve: Calcified annulus. Mildly thickened leaflets . - Left atrium: The atrium was mildly dilated. - Atrial septum: No defect or patent foramen ovale was identified.  Patient Profile     77 y.o. female with witnessed cardiac arrest (initial rhythm VF) with relatively rapid resuscitation, history of CAD s/p CABG, last known with normal left ventricular systolic function, ESRD, previous left carotid endarterectomy, history of TIA.  Assessment & Plan    1. VF arrest: Initial presentation notable for the absence of angina, prolonged QT interval but without other signs of acute ischemia on ECG, minimum elevation in cardiac troponin.  Very frequent PVCs/ventricular bigeminy on presentation, now  resolved.  Note hypocalcemia but no other major electrolyte abnormalities.  Will require ischemic work-up, probably coronary angiography, if she has good neurological recovery.  However, at this point without urgent indication for cardiac catheterization. 2. CAD s/p CABG: Clearly at risk for ischemic events.  Echo pending.  Repeat cardiac enzymes have been drawn.  Last ECG nonacute. 3. ESRD: Had uneventful dialysis the day of cardiac arrest. 4. Suspected aspiration pneumonia.     For questions or updates, please contact [place] Please consult [email or web address] for contact info under        Signed, Sanda Klein, MD  04/03/2018, 8:13 AM

## 2018-04-03 NOTE — Consult Note (Signed)
Renal Service Consult Note [facility]  [name] 04/03/2018 [place] Requesting Physician:  Dr. Jimmey Ralph  Reason for Consult: Renal failure HPI: The patient is a 77 y.o. year-old w/ hx of  CVA, HTN, MI/ CABG DM2, CKD, afib and CKD started hemodialysis about 3 weeks ago.  Family states she had one passing out spell last week after HD at home.  Pt had HD yest and was fine thereafter last night per family today.  This am she had cardiac arrest, possibly choking event. EMS was called and she was shocked for VF then had asystole.  After about 12 min CPR prior to ROSC.  On vent in ICU now, asked to see for ESRD.    Pt on vent, awake and responsive. Unable to provide history though.     Old chart:  04/2008 - L CEA  07/2011 - CABG x 3, hx HTN, DM2, CKD  04/2015 - R CEA  07/2015 - AMS, afib, dm, ckd IV  11/2015 - lumbar discectomy    Past Medical History  Past Medical History:  Diagnosis Date  . Anemia   . Arthritis   . Atrial fibrillation ([place])   . Blood transfusion   . CAD (coronary artery disease)   . Cancer ([place])    .  top of head- melonoma  . Carotid artery occlusion    Carotid Endartectom,y - left 2009.  Blockage Right being watched by Dr Scot Dock.  . Carotid stenosis   . 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 Complication of anesthesia    pt. states that she was difficult to wake  . Depression   . Diabetes mellitus without complication ([place])   . Dysrhythmia   . General weakness 12/2015  . GERD (gastroesophageal reflux disease)   . Glaucoma   . History of hiatal hernia   . History of kidney stones    passed  . History of pneumonia   . HOH (hard of hearing)   . Hypertension   . Hypokalemia 12/2015  . Myocardial infarction ([place])   . Neuromuscular disorder ([place])    CARPEL TUNNEL  . Pneumonia   . Shortness of breath   . Stroke ([place])    hx of TIA   Past Surgical History  Past Surgical History:  Procedure Laterality Date  .  A/V FISTULAGRAM N/A 07/21/2017   Procedure: A/V FISTULAGRAM - Left Arm;  Surgeon: Angelia Mould, MD;  Location: [place] [facility];  Service: Cardiovascular;  Laterality: N/A;  . AV FISTULA PLACEMENT Left 05/31/2014   Procedure: Creation of Left Arm arteriovenous brachiocephalic Fistula;  Surgeon: Angelia Mould, MD;  Location: [place];  Service: Vascular;  Laterality: Left;  . AV FISTULA PLACEMENT Left 09/01/2014   Procedure: INSERTION OF ARTERIOVENOUS (AV) GORE-TEX GRAFT LEFT UPPER ARM USING  4-7 MM X 45 CM SRTETCH GORETEX GRAFT;  Surgeon: Angelia Mould, MD;  Location: [place];  Service: Vascular;  Laterality: Left;  . CARDIAC CATHETERIZATION    . CAROTID ENDARTERECTOMY Left 2009    CEA  . carpel tunnel    . COLONOSCOPY  07/25/2011   Procedure: COLONOSCOPY;  Surgeon: Winfield Cunas., MD;  Location[facility] ENDOSCOPY;  Service: Endoscopy;  Laterality: N/A;  . CORONARY ARTERY BYPASS GRAFT  07/29/2011   Procedure: CORONARY ARTERY BYPASS GRAFTING (CABG);  Surgeon: Gaye Pollack, MD;  Location: [place];  Service: Open Heart Surgery;  Laterality: N/A;  . ENDARTERECTOMY Right 04/21/2015  Procedure: ENDARTERECTOMY CAROTID;  Surgeon: Angelia Mould, MD;  Location[facility] OR;  Service: Vascular;  Laterality: Right;  . ESOPHAGOGASTRODUODENOSCOPY  07/25/2011   Procedure: ESOPHAGOGASTRODUODENOSCOPY (EGD);  Surgeon: Winfield Cunas., MD;  Location[facility] ENDOSCOPY;  Service: Endoscopy;  Laterality: N/A;  . EYE SURGERY    . FISTULOGRAM Left 08/29/2014   Procedure: FISTULOGRAM;  Surgeon: Angelia Mould, MD;  Location: [facility] CATH LAB;  Service: Cardiovascular;  Laterality: Left;  . KNEE SURGERY Left   . LUMBAR LAMINECTOMY/DECOMPRESSION MICRODISCECTOMY N/A 12/01/2015   Procedure: L5-S1 Decompression and Bilateral Microdiscectomy;  Surgeon: Marybelle Killings, MD;  Location: [place];  Service: Orthopedics;  Laterality: N/A;  . NECK SURGERY    . PERIPHERAL VASCULAR BALLOON ANGIOPLASTY  07/21/2017    Procedure: PERIPHERAL VASCULAR BALLOON ANGIOPLASTY;  Surgeon: Angelia Mould, MD;  Location: [place] CV LAB;  Service: Cardiovascular;;  LT Arm AVF  . TONSILLECTOMY     Family History  Family History  Problem Relation Age of Onset  . Diabetes Mother   . Hypertension Mother   . Heart disease Mother        beofre age 64  . Heart attack Mother   . Stroke Mother   . Cancer Father   . Hyperlipidemia Father   . Hypertension Father   . Deep vein thrombosis Daughter   . Diabetes Daughter   . Hyperlipidemia Daughter   . Hypertension Daughter   . Heart disease Daughter   . Peripheral vascular disease Daughter   . Breast cancer Neg Hx    Social History  reports that she has never smoked. She has never used smokeless tobacco. She reports that she does not drink alcohol or use drugs. Allergies  Allergies  Allergen Reactions  . Gabapentin Other (See Comments)    Hallucinations and "Makes me go crazy"   . Sulfa Antibiotics Other (See Comments)    Altered mental state   . Amlodipine Other (See Comments)    "Makes my legs swell"; edema   Home medications Prior to Admission medications   Medication Sig Start Date End Date Taking? Authorizing Provider  acetaminophen (TYLENOL) 325 MG tablet Take 2 tablets (650 mg total) by mouth every 6 (six) hours as needed for mild pain. 12/15/15  Yes Love, Ivan Anchors, PA-C  allopurinol (ZYLOPRIM) 100 MG tablet Take 200 mg by mouth daily.    Yes [provider]  ALPRAZolam (XANAX) 0.25 MG tablet Take 0.125 mg by mouth at bedtime.  03/31/18  Yes [provider]  aspirin EC 81 MG tablet Take 1 tablet (81 mg total) by mouth daily. 09/30/17  Yes Jettie Booze, MD  atorvastatin (LIPITOR) 10 MG tablet TAKE ONE TABLET BY MOUTH ONE TIME DAILY 02/21/14  Yes Jettie Booze, MD  b complex-C-folic acid 1 MG capsule Take 1 capsule by mouth daily.   Yes [provider]  brimonidine (ALPHAGAN P) 0.1 % SOLN Place 1 drop into the  left eye 2 (two) times daily.   Yes [provider]  Cholecalciferol (VITAMIN D) 2000 UNITS tablet Take 2,000 Units by mouth 2 (two) times daily.   Yes [provider]  citalopram (CELEXA) 20 MG tablet Take 20 mg by mouth daily.   Yes [provider]  colchicine 0.6 MG tablet Take 0.6 mg by mouth 2 (two) times daily as needed (for gout flares).    Yes [provider]  Cyanocobalamin (B-12) 5000 MCG CAPS Take 5,000 mcg by mouth daily.  Yes [provider]  insulin glargine (LANTUS) 100 UNIT/ML injection Inject 0.2-0.3 mLs (20-30 Units total) into the skin at bedtime. Patient taking differently: Inject 10-30 Units into the skin See admin instructions. Inject 10-30 units into the skin at bedtime, per sliding scale, and may inject an additional 10 units in the morning as needed for high blood sugar 12/15/15  Yes Love, Ivan Anchors, PA-C  Menthol-Methyl Salicylate (MUSCLE RUB) 10-15 % CREA Apply 1 application topically 2 (two) times daily. Patient taking differently: Apply 1 application topically as needed for muscle pain.  12/15/15  Yes Love, Ivan Anchors, PA-C  metoprolol tartrate (LOPRESSOR) 25 MG tablet Take 1 tablet (25 mg total) by mouth 2 (two) times daily. Patient taking differently: Take 25 mg by mouth every morning.  12/10/17  Yes Jettie Booze, MD  nitroGLYCERIN (NITROSTAT) 0.4 MG SL tablet Place 0.4 mg under the tongue every 5 (five) minutes as needed for chest pain.  07/10/11  Yes [provider]  Omega-3 Fatty Acids (FISH OIL) 1200 MG CAPS Take 1,200 mg by mouth 2 (two) times daily.   Yes [provider]  omeprazole (PRILOSEC) 20 MG capsule Take 20 mg by mouth daily as needed (acid reflux).    Yes [provider]  PROCTOSOL HC 2.5 % rectal cream Place 1 application rectally 2 (two) times daily as needed for hemorrhoids.  03/31/18  Yes [provider]  sitaGLIPtin (JANUVIA) 25 MG tablet Take 25 mg by mouth daily.   11/07/15  Yes [provider]  timolol (TIMOPTIC) 0.5 % ophthalmic solution Place 1 drop into the left eye daily.   Yes [provider]  travoprost, benzalkonium, (TRAVATAN) 0.004 % ophthalmic solution Place 1 drop into both eyes at bedtime.    Yes [provider]  isosorbide mononitrate (IMDUR) 30 MG 24 hr tablet Take 1 tablet (30 mg total) by mouth daily. Patient not taking: Reported on 04/02/2018 12/25/15   Hosie Poisson, MD  prazosin (MINIPRESS) 1 MG capsule TAKE 1 CAPSULE (1 MG TOTAL) BY MOUTH AT BEDTIME. Patient not taking: Reported on 04/02/2018 01/18/16   Meredith Staggers, MD   Liver Function Tests Recent Labs  Lab 04/02/18 2043  AST 196*  ALT 128*  ALKPHOS 91  BILITOT 0.7  PROT 6.7  ALBUMIN 3.1*   No results for input(s): LIPASE, AMYLASE in the last 168 hours. CBC Recent Labs  Lab 04/02/18 1918 04/02/18 2043 04/03/18 0605  WBC  --  18.9* 15.9*  NEUTROABS  --  15.1*  --   HGB 10.5* 12.2 10.8*  HCT 31.0* 40.4 34.8*  MCV  --  111.6* 107.7*  PLT  --  204 967*   Basic Metabolic Panel Recent Labs  Lab 04/02/18 1918 04/02/18 2043 04/03/18 0605  NA 138 140 140  K 4.0 3.8 3.8  CL 100 100 102  CO2  --  25 23  GLUCOSE 276* 269* 203*  BUN 21 18 31*  CREATININE 2.00* 1.91* 2.42*  CALCIUM  --  8.6* 8.4*  PHOS  --   --  2.8   Iron/TIBC/Ferritin/ %Sat    Component Value Date/Time   IRON 87 02/11/2018 1204   TIBC 283 02/11/2018 1204   FERRITIN 660 (H) 02/25/2018 1252   IRONPCTSAT 31 02/11/2018 1204    Vitals:   04/03/18 0930 04/03/18 0945 04/03/18 1000 04/03/18 1130  BP: (!) 116/46 (!) 112/49 (!) 119/42   Pulse: 77 72 72   Resp: (!) 22 (!) 9 (!) 21  Temp:    98.9 F (37.2 C)  TempSrc:    Oral  SpO2: 96% 95% 94%   Weight:      Height:       Exam Gen elderly, chronic ill appearing, frail, obese, on vent/ w ETT No rash, cyanosis or gangrene Sclera anicteric  No jvd or bruits Chest clear bilats to bases RRR no MRG Abd soft ntnd  no mass or ascites +bs obese GU defer MS no joint effusions or deformity Ext no sig LE or dependent edema, no wounds or ulcers Neuro is alert, following commands LUA AVF+bruit    Home meds:  - alprazolam 0.125 hs/ citalopram 20  - prazosin 1mg  hs/ metoprolol tartrate 25 qam  - sitagliptin 25 qd/ insulin glargine 10- 30 unit hs  - allopurinol 200/ ecasa 81/ atorvastatin 10 / omeprazole 20/ imdur 30  - prns/ eyedrops / vitmains   Dialysis: [facility]   4h  76kg  3K/ 2Ca bath  LUA AVG  Hep none  - mircera 100 q 2     Impression: 1. ESRD - HD TTS. Plan HD tomorrow 2. Cardiac arrest - Vfib, cardiology evaluating 3. CAD sp CABG (2013) 4. Volume - up 1-2kg by wts,  5. HTN - on 2 bp meds at home, IV hydral here prn, per cards 6. DM2 on insulin 7. VDRF   Plan - as above  [name] MD [place] pager [phone]   04/03/2018, 12:05 PM

## 2018-04-03 NOTE — Progress Notes (Signed)
ANTICOAGULATION CONSULT NOTE - [place] for heparin Indication: chest pain/ACS  Allergies  Allergen Reactions  . Gabapentin Other (See Comments)    Hallucinations and "Makes me go crazy"   . Sulfa Antibiotics Other (See Comments)    Altered mental state   . Amlodipine Other (See Comments)    "Makes my legs swell"; edema    Assessment: 50 yoF s/p cardiac arrest on IV heparin for ACS r/o. Initial heparin level therapeutic at 1000 units/hr. CBC stable, trops up. Cardiology planning ischemic eval depending upon neurologic recovery.  Heparin level therapeutic this PM   Goal of Therapy:  Heparin level 0.3-0.7 units/ml Monitor platelets by anticoagulation protocol: Yes   Plan:  -Continue IV heparin 1000 units/hr -Daily heparin level, CBC  Thank you Anette Guarneri, PharmD Please check AMION for all [place] numbers 04/03/2018

## 2018-04-03 NOTE — Progress Notes (Signed)
Sputum sample collected and sent to the lab.  

## 2018-04-04 ENCOUNTER — Other Ambulatory Visit: Payer: Self-pay

## 2018-04-04 DIAGNOSIS — I214 Non-ST elevation (NSTEMI) myocardial infarction: Secondary | ICD-10-CM

## 2018-04-04 DIAGNOSIS — J9601 Acute respiratory failure with hypoxia: Secondary | ICD-10-CM

## 2018-04-04 LAB — GLUCOSE, CAPILLARY
Glucose-Capillary: 166 mg/dL — ABNORMAL HIGH (ref 70–99)
Glucose-Capillary: 172 mg/dL — ABNORMAL HIGH (ref 70–99)
Glucose-Capillary: 172 mg/dL — ABNORMAL HIGH (ref 70–99)
Glucose-Capillary: 179 mg/dL — ABNORMAL HIGH (ref 70–99)
Glucose-Capillary: 207 mg/dL — ABNORMAL HIGH (ref 70–99)
Glucose-Capillary: 222 mg/dL — ABNORMAL HIGH (ref 70–99)

## 2018-04-04 LAB — COMPREHENSIVE METABOLIC PANEL
ALK PHOS: 58 U/L (ref 38–126)
ALT: 53 U/L — AB (ref 0–44)
AST: 45 U/L — ABNORMAL HIGH (ref 15–41)
Albumin: 2.2 g/dL — ABNORMAL LOW (ref 3.5–5.0)
Anion gap: 14 (ref 5–15)
BUN: 51 mg/dL — ABNORMAL HIGH (ref 8–23)
CALCIUM: 8.6 mg/dL — AB (ref 8.9–10.3)
CO2: 24 mmol/L (ref 22–32)
CREATININE: 3.9 mg/dL — AB (ref 0.44–1.00)
Chloride: 102 mmol/L (ref 98–111)
GFR calc non Af Amer: 10 mL/min — ABNORMAL LOW (ref >60)
GFR, EST AFRICAN AMERICAN: 12 mL/min — AB (ref >60)
GLUCOSE: 206 mg/dL — AB (ref 70–99)
Potassium: 3.9 mmol/L (ref 3.5–5.1)
SODIUM: 140 mmol/L (ref 135–145)
Total Bilirubin: 0.6 mg/dL (ref 0.3–1.2)
Total Protein: 5.3 g/dL — ABNORMAL LOW (ref 6.5–8.1)

## 2018-04-04 LAB — CBC
HCT: 28.4 % — ABNORMAL LOW (ref 36.0–46.0)
HEMOGLOBIN: 8.7 g/dL — AB (ref 12.0–15.0)
MCH: 33.2 pg (ref 26.0–34.0)
MCHC: 30.6 g/dL (ref 30.0–36.0)
MCV: 108.4 fL — ABNORMAL HIGH (ref 78.0–100.0)
Platelets: 147 10*3/uL — ABNORMAL LOW (ref 150–400)
RBC: 2.62 MIL/uL — AB (ref 3.87–5.11)
RDW: 14.9 % (ref 11.5–15.5)
WBC: 10.1 10*3/uL (ref 4.0–10.5)

## 2018-04-04 LAB — HEPARIN LEVEL (UNFRACTIONATED): Heparin Unfractionated: 0.4 IU/mL (ref 0.30–0.70)

## 2018-04-04 LAB — PROCALCITONIN: PROCALCITONIN: 8.23 ng/mL

## 2018-04-04 MED ORDER — METOPROLOL TARTRATE 25 MG PO TABS
25.0000 mg | ORAL_TABLET | Freq: Two times a day (BID) | ORAL | Status: DC
  Administered 2018-04-04: 25 mg via NASOGASTRIC
  Filled 2018-04-04: qty 1

## 2018-04-04 MED ORDER — ATORVASTATIN CALCIUM 40 MG PO TABS: 40.0000 mg | ORAL_TABLET | Freq: Every day | ORAL | Status: DC

## 2018-04-04 MED ORDER — METOPROLOL TARTRATE 25 MG PO TABS
25.0000 mg | ORAL_TABLET | Freq: Two times a day (BID) | ORAL | Status: DC
  Administered 2018-04-04 – 2018-04-06 (×4): 25 mg via ORAL
  Filled 2018-04-04 (×4): qty 1

## 2018-04-04 MED ORDER — ATORVASTATIN CALCIUM 40 MG PO TABS
40.0000 mg | ORAL_TABLET | Freq: Every day | ORAL | Status: DC
  Administered 2018-04-04 – 2018-04-14 (×11): 40 mg via ORAL
  Filled 2018-04-04 (×11): qty 1

## 2018-04-04 MED ORDER — PIPERACILLIN-TAZOBACTAM 3.375 G IVPB
3.3750 g | Freq: Two times a day (BID) | INTRAVENOUS | Status: AC
  Administered 2018-04-04 – 2018-04-08 (×8): 3.375 g via INTRAVENOUS
  Filled 2018-04-04 (×8): qty 50

## 2018-04-04 MED ORDER — IBUPROFEN 200 MG PO TABS
400.0000 mg | ORAL_TABLET | Freq: Once | ORAL | Status: AC
  Administered 2018-04-04: 400 mg via ORAL
  Filled 2018-04-04: qty 2

## 2018-04-04 MED ORDER — BOOST / RESOURCE BREEZE PO LIQD CUSTOM
1.0000 | Freq: Three times a day (TID) | ORAL | Status: DC
  Administered 2018-04-04 – 2018-04-13 (×18): 1 via ORAL
  Filled 2018-04-04 (×7): qty 1

## 2018-04-04 MED ORDER — ORAL CARE MOUTH RINSE
15.0000 mL | Freq: Two times a day (BID) | OROMUCOSAL | Status: DC
  Administered 2018-04-04 – 2018-04-12 (×12): 15 mL via OROMUCOSAL

## 2018-04-04 MED ORDER — INSULIN ASPART 100 UNIT/ML ~~LOC~~ SOLN
2.0000 [IU] | Freq: Three times a day (TID) | SUBCUTANEOUS | Status: DC
  Administered 2018-04-05: 2 [IU] via SUBCUTANEOUS
  Administered 2018-04-05: 4 [IU] via SUBCUTANEOUS
  Administered 2018-04-05: 6 [IU] via SUBCUTANEOUS
  Administered 2018-04-06 (×2): 2 [IU] via SUBCUTANEOUS
  Administered 2018-04-06 – 2018-04-07 (×2): 6 [IU] via SUBCUTANEOUS
  Administered 2018-04-09 – 2018-04-10 (×2): 2 [IU] via SUBCUTANEOUS
  Administered 2018-04-10: 4 [IU] via SUBCUTANEOUS

## 2018-04-04 MED ORDER — ACETAMINOPHEN 325 MG PO TABS
650.0000 mg | ORAL_TABLET | Freq: Four times a day (QID) | ORAL | Status: DC | PRN
  Administered 2018-04-04 – 2018-04-15 (×4): 650 mg via ORAL
  Filled 2018-04-04 (×4): qty 2

## 2018-04-04 MED ORDER — ASPIRIN 81 MG PO CHEW
81.0000 mg | CHEWABLE_TABLET | Freq: Every day | ORAL | Status: DC
  Administered 2018-04-05 – 2018-04-15 (×11): 81 mg via ORAL
  Filled 2018-04-04 (×11): qty 1

## 2018-04-04 MED ORDER — INSULIN GLARGINE 100 UNIT/ML ~~LOC~~ SOLN
15.0000 [IU] | Freq: Every day | SUBCUTANEOUS | Status: DC
  Administered 2018-04-05 – 2018-04-15 (×10): 15 [IU] via SUBCUTANEOUS
  Filled 2018-04-04 (×12): qty 0.15

## 2018-04-04 NOTE — Progress Notes (Signed)
Progress Note  Patient Name: [name] Date of Encounter: 04/04/2018  Primary Cardiologist: Larae Grooms, MD   Subjective   Opens eyes to voice. Intubated. Being dialyzed.   Inpatient Medications    Scheduled Meds: . aspirin  81 mg Per Tube Daily  . atorvastatin  10 mg Per Tube q1800  . B-complex with vitamin C  1 tablet Per Tube Daily  . brimonidine  1 drop Left Eye TID  . chlorhexidine gluconate (MEDLINE KIT)  15 mL Mouth Rinse BID  . Chlorhexidine Gluconate Cloth  6 each Topical Q0600  . insulin aspart  2-6 Units Subcutaneous Q4H  . insulin glargine  10 Units Subcutaneous Daily  . latanoprost  1 drop Both Eyes QHS  . mouth rinse  15 mL Mouth Rinse 10 times per day  . timolol  1 drop Left Eye Daily  . vancomycin  500 mg Oral Q6H   Continuous Infusions: . sodium chloride 10 mL/hr at 04/03/18 1000  . sodium chloride    . famotidine (PEPCID) IV Stopped (04/04/18 0115)  . feeding supplement (VITAL HIGH PROTEIN) 1,000 mL (04/03/18 1553)  . heparin 1,000 Units/hr (04/04/18 0600)  . piperacillin-tazobactam (ZOSYN)  IV Stopped (04/04/18 0037)  . vancomycin     PRN Meds: sodium chloride, bisacodyl, docusate, fentaNYL (SUBLIMAZE) injection, fentaNYL (SUBLIMAZE) injection, Gerhardt's butt cream, hydrALAZINE, metoprolol tartrate, midazolam, midazolam   Vital Signs    Vitals:   04/04/18 0745 04/04/18 0800 04/04/18 0813 04/04/18 0815  BP: (!) 140/54 136/61  (!) 147/59  Pulse: 96 91 94 90  Resp: 20 18 (!) 27 (!) 23  Temp: 98.2 F (36.8 C)     TempSrc: Axillary     SpO2: 97% 100% 99% 100%  Weight:      Height:        Intake/Output Summary (Last 24 hours) at 04/04/2018 0832 Last data filed at 04/04/2018 0600 Gross per 24 hour  Intake 1234.46 ml  Output -  Net 1234.46 ml   Filed Weights   04/03/18 0500 04/04/18 0500 04/04/18 0700  Weight: 77.5 kg 79 kg 77.1 kg    Telemetry    Sinus rhythm with isolated PACs and PVCs- Personally Reviewed  ECG    No  new tracing; yesterday's ECG shows sinus rhythm and 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   Physical Exam  Opens eyes to voice and seems to track and follow some limited commands; intubated GEN: No acute distress.   Neck: No JVD Cardiac: RRR, normal S1-2, no murmurs, rubs, or gallops.  Left arm AV fistula with good thrill/bruit Respiratory: Clear to auscultation bilaterally. GI: Soft, nontender, non-distended  MS: No edema; No deformity. Neuro:  Nonfocal  Psych: Normal affect   Labs    Chemistry Recent Labs  Lab 04/02/18 2043 04/03/18 0605 04/04/18 0239  NA 140 140 140  K 3.8 3.8 3.9  CL 100 102 102  CO2 _0 GLUCOSE 269* 203* 206*  BUN 18 31* 51*  CREATININE 1.91* 2.42* 3.90*  CALCIUM 8.6* 8.4* 8.6*  PROT 6.7  --  5.3*  ALBUMIN 3.1*  --  2.2*  AST 196*  --  45*  ALT 128*  --  53*  ALKPHOS 91  --  58  BILITOT 0.7  --  0.6  GFRNONAA 24* 18* 10*  GFRAA 28* 21* 12*  ANIONGAP 15 15 14  Hematology Recent Labs  Lab 04/02/18 2043 04/03/18 0605 04/04/18 0239  WBC 18.9* 15.9* 10.1  RBC 3.62* 3.23* 2.62*  HGB 12.2 10.8* 8.7*  HCT 40.4 34.8* 28.4*  MCV 111.6* 107.7* 108.4*  MCH 33.7 33.4 33.2  MCHC 30.2 31.0 30.6  RDW 14.6 14.5 14.9  PLT 204 146* 147*    Cardiac Enzymes Recent Labs  Lab 04/03/18 0054 04/03/18 0605  TROPONINI 1.17* 1.14*    Recent Labs  Lab 04/02/18 1916  TROPIPOC 0.20*     BNPNo results for input(s): BNP, PROBNP in the last 168 hours.   DDimer No results for input(s): DDIMER in the last 168 hours.   Radiology    Dg Abd 1 View  Result Date: 04/03/2018 CLINICAL DATA:  OG tube placement EXAM: ABDOMEN - 1 VIEW COMPARISON:  12/04/2015 FINDINGS: OG tube tip is in the distal stomach or proximal duodenum. Nonobstructive bowel gas pattern noted in the upper abdomen. IMPRESSION: OG tube tip in the distal stomach or proximal duodenum.  Electronically Signed   By: Kevin  Dover M.D.   On: 04/03/2018 09:35   Ct Head Wo Contrast  Result Date: 04/02/2018 CLINICAL DATA:  Status post cardiac arrest with altered mental status EXAM: CT HEAD WITHOUT CONTRAST TECHNIQUE: Contiguous axial images were obtained from the base of the skull through the vertex without intravenous contrast. COMPARISON:  January 11, 2017 FINDINGS: Brain: There is age related volume loss. There is no intracranial mass, hemorrhage, extra-axial fluid collection, or midline shift. Slight periventricular small vessel disease is stable. There is no new gray-white compartment lesion. No evident acute infarct. Basal ganglia calcification is stable and felt to be physiologic in this age group. Vascular: No evident hyperdense vessel. There is calcification in the distal vertebral arteries and carotid siphon regions bilaterally. Skull: The bony calvarium appears intact. There is extensive ossification in the falx, a stable finding. Sinuses/Orbits: There is mucosal thickening in the right maxillary antrum and to a much lesser extent in the left maxillary antrum. There is diffuse opacification of the left sphenoid sinus with extensive mucosal thickening in the right sphenoid sinus. There is a retention cyst in the posterior left ethmoid region. There is opacification of several other ethmoid air cells. Orbits appear symmetric bilaterally. Other: Mastoid air cells are clear. Hearing assist devices are present in each external auditory canal. There is probable cerumen in the right external auditory canal. There is advanced arthropathy in the temporomandibular joints with flattening of the mandibular condyles bilaterally. There is intra-articular calcification in the left temporomandibular joint. IMPRESSION: 1. Age related volume loss with slight periventricular small vessel disease. No acute infarct. No mass or hemorrhage. 2.  Multiple foci of arterial vascular calcification. 3. Extensive  multifocal paranasal sinus disease. Probable cerumen in the right external auditory canal. 4. 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 Electronically Signed   By: William  Woodruff III M.D.   On: 04/02/2018 20:04   Dg Chest Port 1 View  Result Date: 04/03/2018 CLINICAL DATA:  Check endotracheal tube placement EXAM: PORTABLE CHEST 1 VIEW COMPARISON:  04/02/2018 FINDINGS: Cardiac shadow is within normal limits. Lungs are well aerated bilaterally. New increased density in the right lung base is noted consistent with early infiltrate. Endotracheal tube and nasogastric catheter are again seen and stable. Aortic calcifications are noted. No bony abnormality is seen. IMPRESSION: Right basilar infiltrate new from the prior exam. Electronically Signed   By: Mark  Lukens M.D.     On: 04/03/2018 08:33   Dg Chest Portable 1 View  Result Date: 04/02/2018 CLINICAL DATA:  Endotracheal tube adjustment EXAM: PORTABLE CHEST 1 VIEW COMPARISON:  Chest radiograph 04/02/2018 at 7:06 p.m. FINDINGS: Slight retraction of endotracheal tube, which is slightly above the carina. Retraction by 3 cm would place the tip at the level of the clavicular heads. The enteric tube tip is below the field of view. Redemonstration of multiple rib fractures. No pneumothorax. Bibasilar atelectasis. Mild cardiomegaly. IMPRESSION: Endotracheal tube tip now above the carina. Retraction by another 3 cm would place the tip at the level of the clavicular heads. Electronically Signed   By: Kevin  Herman M.D.   On: 04/02/2018 19:49   Dg Chest Port 1 View  Result Date: 04/02/2018 CLINICAL DATA:  Post cardiac arrest EXAM: PORTABLE CHEST 1 VIEW COMPARISON:  12/20/2017, 12/04/2015 FINDINGS: Postsurgical changes of the mediastinum. Tip of the endotracheal tube encroaches the right mainstem bronchus origin. No focal opacity or pleural effusion. Mild cardiomegaly with aortic atherosclerosis. No pneumothorax  is seen. Acute left first second and third rib fractures. Acute appearing right first and second rib fractures. IMPRESSION: 1. Endotracheal tube tip overlies the origin of right mainstem bronchus. Study nodes indicate that the ETT will be adjusted and a repeat image obtained. 2. Mild cardiomegaly 3. Bilateral upper rib fractures.  No definitive pneumothorax. Electronically Signed   By: Kim  Fujinaga M.D.   On: 04/02/2018 19:26    Cardiac Studies   Echo 2016 - Left ventricle: The cavity size was normal. Wall thickness was normal. Systolic function was normal. The estimated ejection fraction was in the range of 55% to 60%. - Mitral valve: Calcified annulus. Mildly thickened leaflets . - Left atrium: The atrium was mildly dilated. - Atrial septum: No defect or patent foramen ovale was identified.  Patient Profile     77 y.o. female with witnessed cardiac arrest (initial rhythm VF) with relatively rapid resuscitation, history of CAD s/p CABG, last known with normal left ventricular systolic function, ESRD, previous left carotid endarterectomy, history of TIA.  Echo: 04/03/18: Study Conclusions  - Left ventricle: The cavity size was normal. Wall thickness was   increased in a pattern of mild LVH. Systolic function was mildly   reduced. The estimated ejection fraction was in the range of 45%   to 50%. There is akinesis of the basal-midinferolateral and   inferior myocardium. Doppler parameters are consistent with   abnormal left ventricular relaxation (grade 1 diastolic   dysfunction). - Mitral valve: Calcified annulus. There was mild regurgitation.  Impressions:  - Akinesis of the basal/mid inferior and inferolateral walls with   overall mildly reduced LV systolic function; mild diastolic   dysfunction; mild LVH; mild MR.  Assessment & Plan    1. VF arrest: Initial presentation notable for the absence of angina, prolonged QT interval but without other signs of acute ischemia on ECG,  mild elevation in cardiac troponin to 1.14.  Very frequent PVCs/ventricular bigeminy on presentation, now resolved.  Ecg yesterday showed new ischemic changes laterally. Echo shows a new inferior/inferolateral wall motion abnormality with mild LV dysfunction. Concern over possible LCx distribution ischemia/infarct.  Would recommend  coronary angiography, if she has good neurological recovery and once extubated.  For now continue supportive care. On ASA. Resume statin and start beta blocker. On IV heparin. 2. CAD s/p CABG: see above. 3. ESRD: Had uneventful dialysis the day of cardiac arrest. Being dialyzed  Today. 4. Suspected aspiration pneumonia . 5. Acute respiratory   failure with hypoxia. Per CCM.     For questions or updates, please contact [facility] Please consult [email or web address] for contact info under        Signed, Peter Jordan, MD  04/04/2018, 8:32 AM    

## 2018-04-04 NOTE — Progress Notes (Signed)
NAME:  [name], MRN:  [id number], DOB:  December 22, 1940, LOS: 2 ADMISSION DATE:  04/02/2018, CONSULTATION DATE:  04/02/18 REFERRING MD:  Ralene Bathe CHIEF COMPLAINT:  Cardiac Arrest   Brief History   [name] is a 77 y.o. female who has extensive PMH, presented to [facility] ED 9/19 after cardiac arrest at home possibly following a choking event (family heard her plate drop and thought she might have choked).  Family started CPR while awaiting EMS.  On EMS arrival, she was shocked once for VF before developing asystole.  Total of roughly 12 - 13 minutes of downtime prior to ROSC (including time that family provided CPR).  Significant Hospital Events   9/19 > admit. 9/20: Stable overnight however troponins did rise slightly.  Having loose stools, reporting chest discomfort to palpation.  Starting oral Vanco for C. difficile antigen positive, diarrhea, and concomitant antibiotic administration.  04/04/18 Completed a SBT. Consults: date of consult/date signed off & final recs:  Cardiology, 9/19 >  Nephrology consulted 9/20 Procedures (surgical and bedside):  ETT 9/19 >   Significant Diagnostic Tests:  CT head 9/19 > age related volume loss. Echo 9/20 > Impressions:  - Akinesis of the basal/mid inferior and inferolateral walls with   overall mildly reduced LV systolic function; mild diastolic   dysfunction; mild LVH; mild MR.  Micro Data: Blood 9/19 >  Sputum 9/19: Few gram-positive cocci few yeast>>> C. difficile: Antigen positive toxin negative obtained on 9/20 Antimicrobials:  Zosyn 9/19 >  Vancomycin 9/19 Oral vancomycin 9/20 Subjective:  Reports chest pain to palpation. No change today. Awake and alert and on psv. Denies dyspnea.  Objective   Blood pressure (!) 132/59, pulse 88, temperature 99.1 F (37.3 C), temperature source Axillary, resp. rate 19, height 5\' 4"  (1.626 m), weight 75.5 kg, SpO2 98 %.    Vent Mode: PSV;CPAP FiO2 (%):  [40 %] 40 % Set Rate:  [18 bmp] 18 bmp Vt  Set:  [440 mL] 440 mL PEEP:  [5 cmH20] 5 cmH20 Pressure Support:  [5 cmH20] 5 cmH20 Plateau Pressure:  [15 cmH20-18 cmH20] 15 cmH20   Intake/Output Summary (Last 24 hours) at 04/04/2018 1349 Last data filed at 04/04/2018 1200 Gross per 24 hour  Intake 1121.4 ml  Output 1500 ml  Net -378.6 ml   Filed Weights   04/04/18 0500 04/04/18 0700 04/04/18 1200  Weight: 79 kg 77.1 kg 75.5 kg    Examination: General: Chronically ill 77 year old white female currently mechanically ventilated no acute distress. HEENT normocephalic atraumatic no jugular venous distention orally intubated pupils equal and reactive. No icterus. Pulmonary: Clear to auscultation diminished bases. No wheezes, crackles or rhonchi. Cardiac: Regular rate and rhythm. No murmur, gallop or rub. Chest: Winces and reports pain to palpation of chest particularly over sternum. No crepitus or sub q emphysema. Abdomen: Soft, nontender.  Liquid stool from Flexi-Seal. No organomegaly. GU: Clear yellow Muscular skeletal: Equal strength and bulk no edema Neuro: Awake, follows commands, interactive.   Assessment & Plan:   Ventricular fibrillation cardiac Arrest - possibly following choking event, but not definite. Family reports recent episode of syncope after HD two weeks ago. Had HD day of event. Hx HTN, HLD, CAD, PAF, MI. -Having chest pain but seems primarily secondary to CPR -PACs and bigeminy improved Plan Continue aspirin and atorvastatin IV heparin Troponin declining Will likely need ischemia evaluation. Defer to cardiology.   Respiratory insufficiency - due to inability to protect the airway in the setting of cardiac arrest.  Plan Extubate today.  Aspiration pneumonia  Plan Day #2 vancomycin and Zosyn Follow-up pending sputum cultures show no growth. Treatment likely to be empiric. Complete a 5-7 day course. Trend CBC  Diarrhea.  C. difficile antigen positive toxin negative. -In light of ongoing antibiotic  support and diarrhea we will treat this Plan Start vancomycin via tube. Complete a 10 day course.  Elevated lactate - presumed due to cardiac arrest.  Trended down Plan Discontinued lactic acid monitoring  ESRD on HD TTS - last session Thurs 9/19. Plan Will consult nephrology Will likely need dialysis prior to extubation  Intermittent fluid and electrolyte imbalance: Hypocalcemia but this was replaced. Plan Continue to monitor the bmp  Hx DM. -Still hyperglycemic Plan Holding preadmission meds Sliding scale insulin Increase lantus insulin to 15 units daily  Anemia of chronic disease no evidence of bleeding Mild thrombocytopenia Plan Serial CBCs. Monitor for active bleeding. None reported.  Disposition / Summary of Today's Plan 04/04/18   Extubate today. Cardiology to evaluate for possible ischemia.    Diet: begin po feeding after extubation. Pain/Anxiety/Delirium protocol (if indicated): n/a DVT prophylaxis: Systemic heparin GI prophylaxis: Famotidine. D/C today Hyperglycemia protocol: SSI, lantus insulin Mobility: Bedrest. Code Status: Full. Family Communication: Husband and granddaughter were updated at bedside  Labs   CBC: Recent Labs  Lab 04/02/18 1918 04/02/18 2043 04/03/18 0605 04/04/18 0239  WBC  --  18.9* 15.9* 10.1  NEUTROABS  --  15.1*  --   --   HGB 10.5* 12.2 10.8* 8.7*  HCT 31.0* 40.4 34.8* 28.4*  MCV  --  111.6* 107.7* 108.4*  PLT  --  204 146* 466*   Basic Metabolic Panel: Recent Labs  Lab 04/02/18 1918 04/02/18 2043 04/03/18 0605 04/04/18 0239  NA 138 140 140 140  K 4.0 3.8 3.8 3.9  CL 100 100 102 102  CO2  --  25 23 24   GLUCOSE 276* 269* 203* 206*  BUN 21 18 31* 51*  CREATININE 2.00* 1.91* 2.42* 3.90*  CALCIUM  --  8.6* 8.4* 8.6*  MG  --   --  1.8  --   PHOS  --   --  2.8  --    GFR: Estimated Creatinine Clearance: 11.8 mL/min (A) (by C-G formula based on SCr of 3.9 mg/dL (H)). Recent Labs  Lab 04/02/18 1918  04/02/18 2043 04/02/18 2044 04/02/18 2049 04/03/18 0054 04/03/18 0605 04/04/18 0239  PROCALCITON  --   --   --   --   --  1.72 8.23  WBC  --  18.9*  --   --   --  15.9* 10.1  LATICACIDVEN 5.11*  --  3.4* 3.33* 2.6*  --   --    Liver Function Tests: Recent Labs  Lab 04/02/18 2043 04/04/18 0239  AST 196* 45*  ALT 128* 53*  ALKPHOS 91 58  BILITOT 0.7 0.6  PROT 6.7 5.3*  ALBUMIN 3.1* 2.2*   No results for input(s): LIPASE, AMYLASE in the last 168 hours. No results for input(s): AMMONIA in the last 168 hours. ABG    Component Value Date/Time   PHART 7.388 04/03/2018 0406   PCO2ART 41.1 04/03/2018 0406   PO2ART 85.0 04/03/2018 0406   HCO3 24.2 04/03/2018 0406   TCO2 27 04/02/2018 1939   ACIDBASEDEF 0.2 04/03/2018 0406   O2SAT 95.2 04/03/2018 0406    Coagulation Profile: Recent Labs  Lab 04/02/18 2043  INR 1.04   Cardiac Enzymes: Recent Labs  Lab 04/03/18 0054 04/03/18 5993  TROPONINI 1.17* 1.14*   HbA1C: Hgb A1c MFr Bld  Date/Time Value Ref Range Status  11/23/2015 11:46 AM 7.1 (H) 4.8 - 5.6 % Final    Comment:    (NOTE)         Pre-diabetes: 5.7 - 6.4         Diabetes: >6.4         Glycemic control for adults with diabetes: <7.0   07/13/2015 04:30 PM 7.0 (H) 4.8 - 5.6 % Final    Comment:    (NOTE)         Pre-diabetes: 5.7 - 6.4         Diabetes: >6.4         Glycemic control for adults with diabetes: <7.0    CBG: Recent Labs  Lab 04/03/18 2029 04/04/18 0038 04/04/18 0422 04/04/18 0756 04/04/18 1155  GLUCAP 100* 207* 179* 172* 166*   CRITICAL CARE Performed by: Jesus Genera   Total critical care time: 38 minutes  Critical care time was exclusive of separately billable procedures and treating other patients.  Critical care was necessary to treat or prevent imminent or life-threatening deterioration.  Critical care was time spent personally by me on the following activities: development of treatment plan with patient and/or surrogate  as well as nursing, discussions with consultants, evaluation of patient's response to treatment, examination of patient, obtaining history from patient or surrogate, ordering and performing treatments and interventions, ordering and review of laboratory studies, ordering and review of radiographic studies, pulse oximetry and re-evaluation of patient's condition.

## 2018-04-04 NOTE — Progress Notes (Signed)
[facility] Progress Note Patient Name: [name] DOB: [birth year] MRN: [id number]   Date of Service  04/04/2018  HPI/Events of Note  Patient c/o pain d/t CPR. No relief with Tylenol PO.   eICU Interventions  Will order: 1. Motrin 400 mg PO X 1 now.      Intervention Category Intermediate Interventions: Pain - evaluation and management  Montanna Mcbain Eugene 04/04/2018, 8:18 PM

## 2018-04-04 NOTE — Procedures (Signed)
Extubation Procedure Note  Patient Details:   Name: [name] DOB: [birth year] MRN: [id number]   Airway Documentation:    Vent end date: 04/04/18 Vent end time: 1430   Evaluation  O2 sats: stable throughout Complications: No apparent complications Patient did tolerate procedure well. Bilateral Breath Sounds: Diminished, Rhonchi   Yes.  Extubated to 4L [place].    Kashif Pooler V 04/04/2018, 2:34 PM

## 2018-04-04 NOTE — Progress Notes (Signed)
ANTICOAGULATION CONSULT NOTE - [place] for heparin Indication: chest pain/ACS  Allergies  Allergen Reactions  . Gabapentin Other (See Comments)    Hallucinations and "Makes me go crazy"   . Sulfa Antibiotics Other (See Comments)    Altered mental state   . Amlodipine Other (See Comments)    "Makes my legs swell"; edema    Patient Measurements: Height: 5\' 4"  (162.6 cm) Weight: 169 lb 15.6 oz (77.1 kg) IBW/kg (Calculated) : 54.7 Heparin Dosing Weight: 72.1 kg  Vital Signs: Temp: 98.2 F (36.8 C) (09/21 0745) Temp Source: Axillary (09/21 0745) BP: 147/59 (09/21 0815) Pulse Rate: 90 (09/21 0815)  Labs: Recent Labs    04/02/18 2043 04/03/18 0054 04/03/18 0605 04/03/18 1235 04/03/18 2057 04/04/18 0239  HGB 12.2  --  10.8*  --   --  8.7*  HCT 40.4  --  34.8*  --   --  28.4*  PLT 204  --  146*  --   --  147*  LABPROT 13.5  --   --   --   --   --   INR 1.04  --   --   --   --   --   HEPARINUNFRC  --   --   --  0.66 0.62 0.40  CREATININE 1.91*  --  2.42*  --   --  3.90*  TROPONINI  --  1.17* 1.14*  --   --   --     Estimated Creatinine Clearance: 12 mL/min (A) (by C-G formula based on SCr of 3.9 mg/dL (H)).   Medical History: Past Medical History:  Diagnosis Date  . Anemia   . Arthritis   . Atrial fibrillation ([employer])   . Blood transfusion   . CAD (coronary artery disease)   . Cancer ([place])    .  top of head- melonoma  . Carotid artery occlusion    Carotid Endartectom,y - left 2009.  Blockage Right being watched by Dr Scot Dock.  . Carotid stenosis   . 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 Complication of anesthesia    pt. states that she was difficult to wake  . Depression   . Diabetes mellitus without complication ([place])   . Dysrhythmia   . General weakness 12/2015  . GERD (gastroesophageal reflux disease)   . Glaucoma   . History of hiatal hernia   . History of kidney stones    passed  . History of pneumonia    . HOH (hard of hearing)   . Hypertension   . Hypokalemia 12/2015  . Myocardial infarction ([place])   . Neuromuscular disorder ([place])    CARPEL TUNNEL  . Pneumonia   . Shortness of breath   . Stroke ([place])    hx of TIA    Medications:  Medications Prior to Admission  Medication Sig Dispense Refill Last Dose  . acetaminophen (TYLENOL) 325 MG tablet Take 2 tablets (650 mg total) by mouth every 6 (six) hours as needed for mild pain.   Unk at [facility]  . allopurinol (ZYLOPRIM) 100 MG tablet Take 200 mg by mouth daily.    04/02/2018 at Unknown time  . ALPRAZolam (XANAX) 0.25 MG tablet Take 0.125 mg by mouth at bedtime.   0 04/01/2018 at pm  . aspirin EC 81 MG tablet Take 1 tablet (81 mg total) by mouth daily. 90 tablet 3 04/02/2018 at 0800  . atorvastatin (LIPITOR) 10 MG tablet TAKE ONE TABLET  BY MOUTH ONE TIME DAILY 30 tablet 0 04/02/2018 at Unknown time  . b complex-C-folic acid 1 MG capsule Take 1 capsule by mouth daily.   04/02/2018 at Unknown time  . brimonidine (ALPHAGAN P) 0.1 % SOLN Place 1 drop into the left eye 2 (two) times daily.   04/02/2018 at Unknown time  . Cholecalciferol (VITAMIN D) 2000 UNITS tablet Take 2,000 Units by mouth 2 (two) times daily.   04/02/2018 at Unknown time  . citalopram (CELEXA) 20 MG tablet Take 20 mg by mouth daily.   04/02/2018 at Unknown time  . colchicine 0.6 MG tablet Take 0.6 mg by mouth 2 (two) times daily as needed (for gout flares).    Unk at [employer]  . Cyanocobalamin (B-12) 5000 MCG CAPS Take 5,000 mcg by mouth daily.   04/02/2018 at Unknown time  . insulin glargine (LANTUS) 100 UNIT/ML injection Inject 0.2-0.3 mLs (20-30 Units total) into the skin at bedtime. (Patient taking differently: Inject 10-30 Units into the skin See admin instructions. Inject 10-30 units into the skin at bedtime, per sliding scale, and may inject an additional 10 units in the morning as needed for high blood sugar) 10 mL 11 04/01/2018 at pm  . Menthol-Methyl Salicylate (MUSCLE RUB) 10-15 % CREA  Apply 1 application topically 2 (two) times daily. (Patient taking differently: Apply 1 application topically as needed for muscle pain. )  0 Unk at Unk  . metoprolol tartrate (LOPRESSOR) 25 MG tablet Take 1 tablet (25 mg total) by mouth 2 (two) times daily. (Patient taking differently: Take 25 mg by mouth every morning. ) 180 tablet 2 04/02/2018 at 0900  . nitroGLYCERIN (NITROSTAT) 0.4 MG SL tablet Place 0.4 mg under the tongue every 5 (five) minutes as needed for chest pain.    Unk at [employer]  . Omega-3 Fatty Acids (FISH OIL) 1200 MG CAPS Take 1,200 mg by mouth 2 (two) times daily.   04/02/2018 at am  . omeprazole (PRILOSEC) 20 MG capsule Take 20 mg by mouth daily as needed (acid reflux).    Unk at [employer]  . PROCTOSOL HC 2.5 % rectal cream Place 1 application rectally 2 (two) times daily as needed for hemorrhoids.   1 Past Week at Unknown time  . sitaGLIPtin (JANUVIA) 25 MG tablet Take 25 mg by mouth daily.    04/02/2018 at am  . timolol (TIMOPTIC) 0.5 % ophthalmic solution Place 1 drop into the left eye daily.   04/02/2018 at am  . travoprost, benzalkonium, (TRAVATAN) 0.004 % ophthalmic solution Place 1 drop into both eyes at bedtime.    04/01/2018 at pm  . isosorbide mononitrate (IMDUR) 30 MG 24 hr tablet Take 1 tablet (30 mg total) by mouth daily. (Patient not taking: Reported on 04/02/2018) 30 tablet 0 Not Taking at Unknown time  . prazosin (MINIPRESS) 1 MG capsule TAKE 1 CAPSULE (1 MG TOTAL) BY MOUTH AT BEDTIME. (Patient not taking: Reported on 04/02/2018) 30 capsule 1 Not Taking at Unknown time    Assessment: 67 yoF s/p cardiac arrest on IV heparin for ACS r/o. Heparin level 0.4 therapeutic at 1000 units/hr. CBC low but stable - no bleeding noted, trops up. Cardiology planning ischemic eval depending upon neurologic recovery.   Goal of Therapy:  Heparin level 0.3-0.7 units/ml Monitor platelets by anticoagulation protocol: Yes   Plan:  -Continue IV heparin 1000 units/hr -Daily heparin level,  CBC  [name] Nasuti Pharm.D. CPP, BCPS Clinical Pharmacist [phone] 04/04/2018 8:32 AM

## 2018-04-04 NOTE — Progress Notes (Signed)
[facility] Progress Note  Subjective: awake and alert on the vent  Vitals:   04/04/18 0930 04/04/18 0945 04/04/18 1000 04/04/18 1015  BP: 124/76 (!) 105/54 (!) 118/53 (!) 118/53  Pulse: 92 89 90 90  Resp: (!) 26 (!) 23 (!) 22 (!) 23  Temp:      TempSrc:      SpO2: 99% 100% 100% 100%  Weight:      Height:        Inpatient medications: . aspirin  81 mg Per Tube Daily  . atorvastatin  40 mg Per NG tube q1800  . B-complex with vitamin C  1 tablet Per Tube Daily  . brimonidine  1 drop Left Eye TID  . chlorhexidine gluconate (MEDLINE KIT)  15 mL Mouth Rinse BID  . Chlorhexidine Gluconate Cloth  6 each Topical Q0600  . insulin aspart  2-6 Units Subcutaneous Q4H  . insulin glargine  10 Units Subcutaneous Daily  . latanoprost  1 drop Both Eyes QHS  . mouth rinse  15 mL Mouth Rinse 10 times per day  . metoprolol tartrate  25 mg Per NG tube BID  . timolol  1 drop Left Eye Daily  . vancomycin  500 mg Oral Q6H   . sodium chloride 10 mL/hr at 04/04/18 0800  . sodium chloride    . famotidine (PEPCID) IV Stopped (04/04/18 0115)  . feeding supplement (VITAL HIGH PROTEIN) 1,000 mL (04/03/18 1553)  . heparin 1,000 Units/hr (04/04/18 1000)  . piperacillin-tazobactam (ZOSYN)  IV    . vancomycin     sodium chloride, bisacodyl, docusate, fentaNYL (SUBLIMAZE) injection, fentaNYL (SUBLIMAZE) injection, Gerhardt's butt cream, hydrALAZINE, metoprolol tartrate, midazolam, midazolam  Iron/TIBC/Ferritin/ %Sat    Component Value Date/Time   IRON 87 02/11/2018 1204   TIBC 283 02/11/2018 1204   FERRITIN 660 (H) 02/25/2018 1252   IRONPCTSAT 31 02/11/2018 1204    Home meds:  - alprazolam 0.125 hs/ citalopram 20  - prazosin 57m hs/ metoprolol tartrate 25 qam  - sitagliptin 25 qd/ insulin glargine 10- 30 unit hs  - allopurinol 200/ ecasa 81/ atorvastatin 10 / omeprazole 20/ imdur 30  - prns/ eyedrops / vitmains   Exam: Gen elderly, chronic ill appearing, frail, obese, on vent/ w  ETT No jvd or bruits Chest clear bilat to bases RRR no MRG Abd soft ntnd no mass or ascites +bs obese Ext no sig LE or dependent edema Neuro is alert, following commands LUA AVF+bruit   Dialysis: [facility]   4h  76kg  3K/ 2Ca bath  LUA AVG  Hep none  - mircera 100 q 2   Impression/Plan: 1. ESRD - HD TTS. Plan HD today in ICU 2. Cardiac arrest/ Vfib - +trop and new WMA, may need heart cath per cardiology 3. CAD sp CABG (2013) 4. Volume - up 1-2kg by wts 5. HTN - on 2 bp meds at home; here is on IV hydral prn 6. DM2 on insulin 7. VDRF 8. Fever/ ^wbc - on empiric IV abx vanc/ zosyn 9. Cdif +Ag/ -toxin - per primary   RKelly SplinterMD CSummit Parkpager [phone]  04/04/2018, 10:35 AM   Recent Labs  Lab 04/02/18 2043 04/03/18 0605 04/04/18 0239  NA 140 140 140  K 3.8 3.8 3.9  CL 100 102 102  CO2 25 23 24   GLUCOSE 269* 203* 206*  BUN 18 31* 51*  CREATININE 1.91* 2.42* 3.90*  CALCIUM 8.6* 8.4* 8.6*  PHOS  --  2.8  --   ALBUMIN 3.1*  --  2.2*  INR 1.04  --   --    Recent Labs  Lab 04/02/18 2043 04/04/18 0239  AST 196* 45*  ALT 128* 53*  ALKPHOS 91 58  BILITOT 0.7 0.6  PROT 6.7 5.3*   Recent Labs  Lab 04/02/18 2043 04/03/18 0605 04/04/18 0239  WBC 18.9* 15.9* 10.1  NEUTROABS 15.1*  --   --   HGB 12.2 10.8* 8.7*  HCT 40.4 34.8* 28.4*  MCV 111.6* 107.7* 108.4*  PLT 204 146* 147*

## 2018-04-05 LAB — GLUCOSE, CAPILLARY
GLUCOSE-CAPILLARY: 151 mg/dL — AB (ref 70–99)
Glucose-Capillary: 140 mg/dL — ABNORMAL HIGH (ref 70–99)
Glucose-Capillary: 226 mg/dL — ABNORMAL HIGH (ref 70–99)
Glucose-Capillary: 206 mg/dL — ABNORMAL HIGH (ref 70–99)

## 2018-04-05 LAB — CBC
HEMATOCRIT: 27.6 % — AB (ref 36.0–46.0)
HEMOGLOBIN: 8.5 g/dL — AB (ref 12.0–15.0)
MCH: 33.6 pg (ref 26.0–34.0)
MCHC: 30.8 g/dL (ref 30.0–36.0)
MCV: 109.1 fL — ABNORMAL HIGH (ref 78.0–100.0)
Platelets: 143 10*3/uL — ABNORMAL LOW (ref 150–400)
RBC: 2.53 MIL/uL — ABNORMAL LOW (ref 3.87–5.11)
RDW: 14.9 % (ref 11.5–15.5)
WBC: 11 10*3/uL — ABNORMAL HIGH (ref 4.0–10.5)

## 2018-04-05 LAB — CULTURE, RESPIRATORY: CULTURE: NORMAL

## 2018-04-05 LAB — HEPARIN LEVEL (UNFRACTIONATED): Heparin Unfractionated: 0.19 IU/mL — ABNORMAL LOW (ref 0.30–0.70)

## 2018-04-05 LAB — PROCALCITONIN: Procalcitonin: 7.94 ng/mL

## 2018-04-05 LAB — CULTURE, RESPIRATORY W GRAM STAIN

## 2018-04-05 MED ORDER — ENOXAPARIN SODIUM 30 MG/0.3ML ~~LOC~~ SOLN
30.0000 mg | SUBCUTANEOUS | Status: DC
  Administered 2018-04-05 – 2018-04-08 (×4): 30 mg via SUBCUTANEOUS
  Filled 2018-04-05 (×4): qty 0.3

## 2018-04-05 NOTE — Progress Notes (Signed)
ANTICOAGULATION CONSULT NOTE - [place] for heparin Indication: chest pain/ACS  Allergies  Allergen Reactions  . Gabapentin Other (See Comments)    Hallucinations and "Makes me go crazy"   . Sulfa Antibiotics Other (See Comments)    Altered mental state   . Amlodipine Other (See Comments)    "Makes my legs swell"; edema    Patient Measurements: Height: 5\' 4"  (162.6 cm) Weight: 172 lb 9.9 oz (78.3 kg) IBW/kg (Calculated) : 54.7 Heparin Dosing Weight: 72.1 kg  Vital Signs: Temp: 98.6 F (37 C) (09/22 0804) Temp Source: Oral (09/22 0804) BP: 151/89 (09/22 0800) Pulse Rate: 75 (09/22 0752)  Labs: Recent Labs    04/02/18 2043 04/03/18 0054 04/03/18 0605  04/03/18 2057 04/04/18 0239 04/05/18 0229  HGB 12.2  --  10.8*  --   --  8.7* 8.5*  HCT 40.4  --  34.8*  --   --  28.4* 27.6*  PLT 204  --  146*  --   --  147* 143*  LABPROT 13.5  --   --   --   --   --   --   INR 1.04  --   --   --   --   --   --   HEPARINUNFRC  --   --   --    < > 0.62 0.40 0.19*  CREATININE 1.91*  --  2.42*  --   --  3.90*  --   TROPONINI  --  1.17* 1.14*  --   --   --   --    < > = values in this interval not displayed.    Estimated Creatinine Clearance: 12 mL/min (A) (by C-G formula based on SCr of 3.9 mg/dL (H)).   Medical History: Past Medical History:  Diagnosis Date  . Anemia   . Arthritis   . Atrial fibrillation ([place])   . Blood transfusion   . CAD (coronary artery disease)   . Cancer ([place])    .  top of head- melonoma  . Carotid artery occlusion    Carotid Endartectom,y - left 2009.  Blockage Right being watched by Dr Scot Dock.  . Carotid stenosis   . 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 Complication of anesthesia    pt. states that she was difficult to wake  . Depression   . Diabetes mellitus without complication ([place])   . Dysrhythmia   . General weakness 12/2015  . GERD (gastroesophageal reflux disease)   . Glaucoma   . History  of hiatal hernia   . History of kidney stones    passed  . History of pneumonia   . HOH (hard of hearing)   . Hypertension   . Hypokalemia 12/2015  . Myocardial infarction ([place])   . Neuromuscular disorder ([place])    CARPEL TUNNEL  . Pneumonia   . Shortness of breath   . Stroke ([place])    hx of TIA    Medications:  Medications Prior to Admission  Medication Sig Dispense Refill Last Dose  . acetaminophen (TYLENOL) 325 MG tablet Take 2 tablets (650 mg total) by mouth every 6 (six) hours as needed for mild pain.   Unk at [facility]  . allopurinol (ZYLOPRIM) 100 MG tablet Take 200 mg by mouth daily.    04/02/2018 at Unknown time  . ALPRAZolam (XANAX) 0.25 MG tablet Take 0.125 mg by mouth at bedtime.   0 04/01/2018 at pm  .  aspirin EC 81 MG tablet Take 1 tablet (81 mg total) by mouth daily. 90 tablet 3 04/02/2018 at 0800  . atorvastatin (LIPITOR) 10 MG tablet TAKE ONE TABLET BY MOUTH ONE TIME DAILY 30 tablet 0 04/02/2018 at Unknown time  . b complex-C-folic acid 1 MG capsule Take 1 capsule by mouth daily.   04/02/2018 at Unknown time  . brimonidine (ALPHAGAN P) 0.1 % SOLN Place 1 drop into the left eye 2 (two) times daily.   04/02/2018 at Unknown time  . Cholecalciferol (VITAMIN D) 2000 UNITS tablet Take 2,000 Units by mouth 2 (two) times daily.   04/02/2018 at Unknown time  . citalopram (CELEXA) 20 MG tablet Take 20 mg by mouth daily.   04/02/2018 at Unknown time  . colchicine 0.6 MG tablet Take 0.6 mg by mouth 2 (two) times daily as needed (for gout flares).    Unk at [employer]  . Cyanocobalamin (B-12) 5000 MCG CAPS Take 5,000 mcg by mouth daily.   04/02/2018 at Unknown time  . insulin glargine (LANTUS) 100 UNIT/ML injection Inject 0.2-0.3 mLs (20-30 Units total) into the skin at bedtime. (Patient taking differently: Inject 10-30 Units into the skin See admin instructions. Inject 10-30 units into the skin at bedtime, per sliding scale, and may inject an additional 10 units in the morning as needed for high blood  sugar) 10 mL 11 04/01/2018 at pm  . Menthol-Methyl Salicylate (MUSCLE RUB) 10-15 % CREA Apply 1 application topically 2 (two) times daily. (Patient taking differently: Apply 1 application topically as needed for muscle pain. )  0 Unk at Unk  . metoprolol tartrate (LOPRESSOR) 25 MG tablet Take 1 tablet (25 mg total) by mouth 2 (two) times daily. (Patient taking differently: Take 25 mg by mouth every morning. ) 180 tablet 2 04/02/2018 at 0900  . nitroGLYCERIN (NITROSTAT) 0.4 MG SL tablet Place 0.4 mg under the tongue every 5 (five) minutes as needed for chest pain.    Unk at [employer]  . Omega-3 Fatty Acids (FISH OIL) 1200 MG CAPS Take 1,200 mg by mouth 2 (two) times daily.   04/02/2018 at am  . omeprazole (PRILOSEC) 20 MG capsule Take 20 mg by mouth daily as needed (acid reflux).    Unk at [employer]  . PROCTOSOL HC 2.5 % rectal cream Place 1 application rectally 2 (two) times daily as needed for hemorrhoids.   1 Past Week at Unknown time  . sitaGLIPtin (JANUVIA) 25 MG tablet Take 25 mg by mouth daily.    04/02/2018 at am  . timolol (TIMOPTIC) 0.5 % ophthalmic solution Place 1 drop into the left eye daily.   04/02/2018 at am  . travoprost, benzalkonium, (TRAVATAN) 0.004 % ophthalmic solution Place 1 drop into both eyes at bedtime.    04/01/2018 at pm  . isosorbide mononitrate (IMDUR) 30 MG 24 hr tablet Take 1 tablet (30 mg total) by mouth daily. (Patient not taking: Reported on 04/02/2018) 30 tablet 0 Not Taking at Unknown time  . prazosin (MINIPRESS) 1 MG capsule TAKE 1 CAPSULE (1 MG TOTAL) BY MOUTH AT BEDTIME. (Patient not taking: Reported on 04/02/2018) 30 capsule 1 Not Taking at Unknown time    Assessment: 54 yoF s/p cardiac arrest on IV heparin for ACS r/o. Heparin level low this am and heparin drip rate increased. 48hr post event will stop heparin and change to VTE px lovenox 30mg  daily.   CBC low but stable - no bleeding noted, trops up. Cardiology planning ischemic eval depending upon neurologic  recovery.   Goal  of Therapy:  Heparin level 0.3-0.7 units/ml Monitor platelets by anticoagulation protocol: Yes   Plan:  Dc heparin  Enoxaparin 30mg  daily  [name] Nasuti Pharm.D. CPP, BCPS Clinical Pharmacist [phone] 04/05/2018 8:25 AM

## 2018-04-05 NOTE — Progress Notes (Signed)
Progress Note  Patient Name: [name] Date of Encounter: 04/05/2018  Primary Cardiologist: Larae Grooms, MD   Subjective   Complains of some intermittent anterior chest pain. Extubated now. Oriented to self but otherwise confused.   Inpatient Medications    Scheduled Meds: . aspirin  81 mg Oral Daily  . atorvastatin  40 mg Oral q1800  . B-complex with vitamin C  1 tablet Per Tube Daily  . brimonidine  1 drop Left Eye TID  . Chlorhexidine Gluconate Cloth  6 each Topical Q0600  . feeding supplement  1 Container Oral TID BM  . insulin aspart  2-6 Units Subcutaneous TID WC  . insulin glargine  15 Units Subcutaneous Daily  . latanoprost  1 drop Both Eyes QHS  . mouth rinse  15 mL Mouth Rinse BID  . metoprolol tartrate  25 mg Oral BID  . timolol  1 drop Left Eye Daily  . vancomycin  500 mg Oral Q6H   Continuous Infusions: . sodium chloride 10 mL/hr at 04/04/18 0800  . sodium chloride    . heparin 1,100 Units/hr (04/05/18 0550)  . piperacillin-tazobactam (ZOSYN)  IV 3.375 g (04/04/18 2300)  . vancomycin 1,000 mg (04/04/18 1106)   PRN Meds: sodium chloride, acetaminophen, bisacodyl, docusate, fentaNYL (SUBLIMAZE) injection, fentaNYL (SUBLIMAZE) injection, Gerhardt's butt cream, hydrALAZINE, metoprolol tartrate, midazolam, midazolam   Vital Signs    Vitals:   04/05/18 0509 04/05/18 0750 04/05/18 0752 04/05/18 0804  BP:   (!) 153/53   Pulse:  72 75   Resp: 16 19 17    Temp:    98.6 F (37 C)  TempSrc:    Oral  SpO2:  (!) 89% 95%   Weight: 78.3 kg     Height:        Intake/Output Summary (Last 24 hours) at 04/05/2018 0806 Last data filed at 04/05/2018 0550 Gross per 24 hour  Intake 892.17 ml  Output 1620 ml  Net -727.83 ml   Filed Weights   04/04/18 0700 04/04/18 1200 04/05/18 0509  Weight: 77.1 kg 75.5 kg 78.3 kg    Telemetry    Sinus rhythm with rare PVCs- Personally Reviewed  ECG    No new tracing; 9/20 ECG shows sinus rhythm and 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   Physical Exam   GEN: No acute distress.  Awake and alert Neck: No JVD Cardiac: RRR, normal S1-2, no murmurs, rubs, or gallops.  Left arm AV fistula with good thrill/bruit Respiratory: Clear to auscultation bilaterally. GI: Soft, nontender, non-distended  MS: No edema; No deformity. Neuro:  Nonfocal, oriented x 1 only. Confused and pulling at lines/IVs Psych: Normal affect   Labs    Chemistry Recent Labs  Lab 04/02/18 2043 04/03/18 0605 04/04/18 0239  NA 140 140 140  K 3.8 3.8 3.9  CL 100 102 102  CO2 25 23 24   GLUCOSE 269* 203* 206*  BUN 18 31* 51*  CREATININE 1.91* 2.42* 3.90*  CALCIUM 8.6* 8.4* 8.6*  PROT 6.7  --  5.3*  ALBUMIN 3.1*  --  2.2*  AST 196*  --  45*  ALT 128*  --  53*  ALKPHOS 91  --  58  BILITOT 0.7  --  0.6  GFRNONAA 24* 18* 10*  GFRAA 28* 21* 12*  ANIONGAP 15 15 14      Hematology Recent Labs  Lab 04/03/18 [phone] 04/04/18 0239  04/05/18 0229  WBC 15.9* 10.1 11.0*  RBC 3.23* 2.62* 2.53*  HGB 10.8* 8.7* 8.5*  HCT 34.8* 28.4* 27.6*  MCV 107.7* 108.4* 109.1*  MCH 33.4 33.2 33.6  MCHC 31.0 30.6 30.8  RDW 14.5 14.9 14.9  PLT 146* 147* 143*    Cardiac Enzymes Recent Labs  Lab 04/03/18 0054 04/03/18 0605  TROPONINI 1.17* 1.14*    Recent Labs  Lab 04/02/18 1916  TROPIPOC 0.20*     BNPNo results for input(s): BNP, PROBNP in the last 168 hours.   DDimer No results for input(s): DDIMER in the last 168 hours.   Radiology    Dg Abd 1 View  Result Date: 04/03/2018 CLINICAL DATA:  OG tube placement EXAM: ABDOMEN - 1 VIEW COMPARISON:  12/04/2015 FINDINGS: OG tube tip is in the distal stomach or proximal duodenum. Nonobstructive bowel gas pattern noted in the upper abdomen. IMPRESSION: OG tube tip in the distal stomach or proximal duodenum. Electronically Signed   By: Rolm Baptise M.D.   On: 04/03/2018 09:35     Cardiac Studies   Echo 2016 - Left ventricle: The cavity size was normal. Wall thickness was normal. Systolic function was normal. The estimated ejection fraction was in the range of 55% to 60%. - Mitral valve: Calcified annulus. Mildly thickened leaflets . - Left atrium: The atrium was mildly dilated. - Atrial septum: No defect or patent foramen ovale was identified.  Patient Profile     77 y.o. female with witnessed cardiac arrest (initial rhythm VF) with relatively rapid resuscitation, history of CAD s/p CABG, last known with normal left ventricular systolic function, ESRD, previous left carotid endarterectomy, history of TIA.  Echo: 04/03/18: Study Conclusions  - Left ventricle: The cavity size was normal. Wall thickness was   increased in a pattern of mild LVH. Systolic function was mildly   reduced. The estimated ejection fraction was in the range of 45%   to 50%. There is akinesis of the basal-midinferolateral and   inferior myocardium. Doppler parameters are consistent with   abnormal left ventricular relaxation (grade 1 diastolic   dysfunction). - Mitral valve: Calcified annulus. There was mild regurgitation.  Impressions:  - Akinesis of the basal/mid inferior and inferolateral walls with   overall mildly reduced LV systolic function; mild diastolic   dysfunction; mild LVH; mild MR.  Assessment & Plan    1. VF arrest: Initial presentation notable for the absence of angina, prolonged QT interval but without other signs of acute ischemia on ECG, mild elevation in cardiac troponin to 1.14.  Very frequent PVCs/ventricular bigeminy on presentation, now resolved.  Ecg  showed new ischemic changes laterally. Echo shows a new inferior/inferolateral wall motion abnormality with mild LV dysfunction. Concern over possible LCx distribution ischemia/infarct.  Would recommend  coronary angiography once she has good neurological recovery.  Too confused at this point but will reassess  daily. For now continue supportive care. On ASA. Resume statin and start beta blocker. She has been anticoagulated with IV heparin for > 48 hours. Will transition to DVT prophylaxis.  2. CAD s/p CABG: see above. 3. ESRD: Had uneventful dialysis the day of cardiac arrest. Had dialysis yeserday. 4. Suspected aspiration pneumonia . 5. Acute respiratory failure with hypoxia. Per CCM.     For questions or updates, please contact [place] Please consult [email or web address] for contact info under        Signed, Donielle Kaigler [place], MD  04/05/2018, 8:06 AM

## 2018-04-05 NOTE — Progress Notes (Signed)
ANTICOAGULATION CONSULT NOTE - Follow Up Consult  Pharmacy Consult for heparin Indication: r/o ACS  Labs: Recent Labs    04/02/18 2043 04/03/18 0054 04/03/18 0605  04/03/18 2057 04/04/18 0239 04/05/18 0229  HGB 12.2  --  10.8*  --   --  8.7* 8.5*  HCT 40.4  --  34.8*  --   --  28.4* 27.6*  PLT 204  --  146*  --   --  147* 143*  LABPROT 13.5  --   --   --   --   --   --   INR 1.04  --   --   --   --   --   --   HEPARINUNFRC  --   --   --    < > 0.62 0.40 0.19*  CREATININE 1.91*  --  2.42*  --   --  3.90*  --   TROPONINI  --  1.17* 1.14*  --   --   --   --    < > = values in this interval not displayed.    Assessment: 77yo female subtherapeutic on heparin after three levels at goal though had been trending down; RN notes no gtt problems; Hgb down, no bleeding noted by RN other than some bleeding from IV after pt pulled it out.  Goal of Therapy:  Heparin level 0.3-0.7 units/ml   Plan:  Will increase heparin gtt by 10% to 1100 units/hr and check level in 8 hours.    Wynona Neat, PharmD, BCPS  04/05/2018,4:02 AM

## 2018-04-05 NOTE — Plan of Care (Signed)
Patient remains very confused.  Difficult to keep a pulse oximeter on hands or ear because she pulls off within minutes.  Also pulled out one IV.  Patient remains non combative and compliant with meds to this point.  Considered mitts however feel this restriction could make her more agitated.  Has slept very little also but due to confusion it is questionable to order any meds that can worsen confusion.  Vitals have remained stable and spot checked 02 sats have been normal parameters.

## 2018-04-05 NOTE — Progress Notes (Signed)
NAME:  [name], MRN:  [id number], DOB:  09/09/1940, LOS: 3 ADMISSION DATE:  04/02/2018, CONSULTATION DATE:  04/02/18 REFERRING MD:  Ralene Bathe CHIEF COMPLAINT:  Cardiac Arrest   Brief History   [name] is a 77 y.o. female who has extensive PMH, presented to [facility] ED 9/19 after cardiac arrest at home possibly following a choking event (family heard her plate drop and thought she might have choked).  Family started CPR while awaiting EMS.  On EMS arrival, she was shocked once for VF before developing asystole.  Total of roughly 12 - 13 minutes of downtime prior to ROSC (including time that family provided CPR).  Significant Hospital Events   9/19 > admit. 9/20: Stable overnight however troponins did rise slightly.  Having loose stools, reporting chest discomfort to palpation.  Starting oral Vanco for C. difficile antigen positive, diarrhea, and concomitant antibiotic administration.  04/04/18 Completed a SBT. Consults: date of consult/date signed off & final recs:  Cardiology, 9/19 >  Nephrology consulted 9/20 Procedures (surgical and bedside):  ETT 9/19 >  9/21- extubated  Significant Diagnostic Tests:  CT head 9/19 > age related volume loss. Echo 9/20 > Impressions:  - Akinesis of the basal/mid inferior and inferolateral walls with   overall mildly reduced LV systolic function; mild diastolic   dysfunction; mild LVH; mild MR.  Micro Data: Blood 9/19 >  Sputum 9/19: Few gram-positive cocci few yeast>>> C. difficile: Antigen positive toxin negative obtained on 9/20 Antimicrobials:  Zosyn 9/19 > will end after 5 total days Vancomycin 9/19> d/c'd 9/22. MRSA PCR negative. No growth of MRSA. Oral vancomycin 9/20 Subjective:  Patient is awake, confused. Good long term memory. Insufficient short term recall. No agitation.  Objective   Blood pressure (!) 151/89, pulse 75, temperature 98.6 F (37 C), temperature source Oral, resp. rate (!) 22, height 5\' 4"  (1.626 m), weight 78.3  kg, SpO2 100 %.    Intake/Output Summary (Last 24 hours) at 04/05/2018 1111 Last data filed at 04/05/2018 0800 Gross per 24 hour  Intake 699.61 ml  Output 1620 ml  Net -920.39 ml   Filed Weights   04/04/18 0700 04/04/18 1200 04/05/18 0509  Weight: 77.1 kg 75.5 kg 78.3 kg    Examination: General: awake and alert HEENT normocephalic atraumatic no jugular venous distention. No stridor. Pulmonary: Clear to auscultation diminished bases. No wheezes, crackles or rhonchi. Cardiac: Regular rate and rhythm. No murmur, gallop or rub. Chest: Winces and reports pain to palpation of chest particularly over sternum. No crepitus or sub q emphysema. Abdomen: Soft, nontender.  Liquid stool from Flexi-Seal. No organomegaly. GU: Clear yellow Muscular skeletal: Equal strength and bulk no edema Neuro: Awake, follows commands, interactive. Confused to person, place and time.   Assessment & Plan:   Ventricular fibrillation cardiac Arrest - possibly following choking event, but not definite. Family reports recent episode of syncope after HD two weeks ago. Had HD day of event. Hx HTN, HLD, CAD, PAF, MI.  Plan Continue aspirin and atorvastatin IV heparin stopped per cardiology Will likely need ischemia evaluation. Defer to cardiology.   Respiratory insufficiency - due to inability to protect the airway in the setting of cardiac arrest.  Plan -resolved  Aspiration pneumonia  Plan Vancomycin stopped. Complete 5 days of Zosyn Trend CBC; declining.  Diarrhea.  C. difficile antigen positive toxin negative. -In light of ongoing antibiotic support and diarrhea we will treat this Plan Vancomycin po. Complete a 10 day course.  Elevated lactate -  presumed due to cardiac arrest.  Trended down Plan   ESRD on HD TTS - last session Thurs 9/19. Plan Continue HD  Intermittent fluid and electrolyte imbalance: Hypocalcemia but this was replaced. Plan Continue to monitor the bmp  Hx DM. -Still  hyperglycemic Plan Holding preadmission meds Sliding scale insulin Increase lantus insulin to 15 units daily; improved glycemic control  Anemia of chronic disease no evidence of bleeding Mild thrombocytopenia Plan Serial CBCs. Monitor for active bleeding. None reported.  Disposition / Summary of Today's Plan 04/05/18   Cardiology to evaluate for possible ischemia.    Diet: heart healthy, carb controlled. Pain/Anxiety/Delirium protocol (if indicated): n/a DVT prophylaxis: Subq heparin GI prophylaxis: n/a Hyperglycemia protocol: SSI, lantus insulin Mobility: Bedrest Code Status: Full. Family Communication: Husband and granddaughter were updated at bedside  Labs   CBC: Recent Labs  Lab 04/02/18 1918 04/02/18 2043 04/03/18 0605 04/04/18 0239 04/05/18 0229  WBC  --  18.9* 15.9* 10.1 11.0*  NEUTROABS  --  15.1*  --   --   --   HGB 10.5* 12.2 10.8* 8.7* 8.5*  HCT 31.0* 40.4 34.8* 28.4* 27.6*  MCV  --  111.6* 107.7* 108.4* 109.1*  PLT  --  204 146* 147* 696*   Basic Metabolic Panel: Recent Labs  Lab 04/02/18 1918 04/02/18 2043 04/03/18 0605 04/04/18 0239  NA 138 140 140 140  K 4.0 3.8 3.8 3.9  CL 100 100 102 102  CO2  --  25 23 24   GLUCOSE 276* 269* 203* 206*  BUN 21 18 31* 51*  CREATININE 2.00* 1.91* 2.42* 3.90*  CALCIUM  --  8.6* 8.4* 8.6*  MG  --   --  1.8  --   PHOS  --   --  2.8  --    GFR: Estimated Creatinine Clearance: 12 mL/min (A) (by C-G formula based on SCr of 3.9 mg/dL (H)). Recent Labs  Lab 04/02/18 1918 04/02/18 2043 04/02/18 2044 04/02/18 2049 04/03/18 0054 04/03/18 0605 04/04/18 0239 04/05/18 0229  PROCALCITON  --   --   --   --   --  1.72 8.23 7.94  WBC  --  18.9*  --   --   --  15.9* 10.1 11.0*  LATICACIDVEN 5.11*  --  3.4* 3.33* 2.6*  --   --   --    Liver Function Tests: Recent Labs  Lab 04/02/18 2043 04/04/18 0239  AST 196* 45*  ALT 128* 53*  ALKPHOS 91 58  BILITOT 0.7 0.6  PROT 6.7 5.3*  ALBUMIN 3.1* 2.2*   No  results for input(s): LIPASE, AMYLASE in the last 168 hours. No results for input(s): AMMONIA in the last 168 hours. ABG    Component Value Date/Time   PHART 7.388 04/03/2018 0406   PCO2ART 41.1 04/03/2018 0406   PO2ART 85.0 04/03/2018 0406   HCO3 24.2 04/03/2018 0406   TCO2 27 04/02/2018 1939   ACIDBASEDEF 0.2 04/03/2018 0406   O2SAT 95.2 04/03/2018 0406    Coagulation Profile: Recent Labs  Lab 04/02/18 2043  INR 1.04   Cardiac Enzymes: Recent Labs  Lab 04/03/18 0054 04/03/18 0605  TROPONINI 1.17* 1.14*   HbA1C: Hgb A1c MFr Bld  Date/Time Value Ref Range Status  11/23/2015 11:46 AM 7.1 (H) 4.8 - 5.6 % Final    Comment:    (NOTE)         Pre-diabetes: 5.7 - 6.4         Diabetes: >6.4  Glycemic control for adults with diabetes: <7.0   07/13/2015 04:30 PM 7.0 (H) 4.8 - 5.6 % Final    Comment:    (NOTE)         Pre-diabetes: 5.7 - 6.4         Diabetes: >6.4         Glycemic control for adults with diabetes: <7.0    CBG: Recent Labs  Lab 04/04/18 0756 04/04/18 1155 04/04/18 1558 04/04/18 2114 04/05/18 0658  GLUCAP 172* 166* 222* 172* 140*   CRITICAL CARE Performed by: Jesus Genera   Total critical care time: 32 minutes  Critical care time was exclusive of separately billable procedures and treating other patients.  Critical care was necessary to treat or prevent imminent or life-threatening deterioration.  Critical care was time spent personally by me on the following activities: development of treatment plan with patient and/or surrogate as well as nursing, discussions with consultants, evaluation of patient's response to treatment, examination of patient, obtaining history from patient or surrogate, ordering and performing treatments and interventions, ordering and review of laboratory studies, ordering and review of radiographic studies, pulse oximetry and re-evaluation of patient's condition.

## 2018-04-05 NOTE — Progress Notes (Signed)
[facility] Progress Note  Subjective: extubated yest, on RA today.  Very confused overnight per RN. Pt w/o complaints.    Vitals:   04/05/18 0750 04/05/18 0752 04/05/18 0800 04/05/18 0804  BP:  (!) 153/53 (!) 151/89   Pulse: 72 75    Resp: 19 17 (!) 22   Temp:    98.6 F (37 C)  TempSrc:    Oral  SpO2: (!) 89% 95% 100%   Weight:      Height:        Inpatient medications: . aspirin  81 mg Oral Daily  . atorvastatin  40 mg Oral q1800  . B-complex with vitamin C  1 tablet Per Tube Daily  . brimonidine  1 drop Left Eye TID  . Chlorhexidine Gluconate Cloth  6 each Topical Q0600  . enoxaparin (LOVENOX) injection  30 mg Subcutaneous Q24H  . feeding supplement  1 Container Oral TID BM  . insulin aspart  2-6 Units Subcutaneous TID WC  . insulin glargine  15 Units Subcutaneous Daily  . latanoprost  1 drop Both Eyes QHS  . mouth rinse  15 mL Mouth Rinse BID  . metoprolol tartrate  25 mg Oral BID  . timolol  1 drop Left Eye Daily  . vancomycin  500 mg Oral Q6H   . sodium chloride 10 mL/hr (04/05/18 0835)  . sodium chloride    . piperacillin-tazobactam (ZOSYN)  IV Stopped (04/04/18 2305)  . vancomycin 1,000 mg (04/04/18 1106)   sodium chloride, acetaminophen, bisacodyl, docusate, fentaNYL (SUBLIMAZE) injection, fentaNYL (SUBLIMAZE) injection, Gerhardt's butt cream, hydrALAZINE, metoprolol tartrate, midazolam, midazolam  Iron/TIBC/Ferritin/ %Sat    Component Value Date/Time   IRON 87 02/11/2018 1204   TIBC 283 02/11/2018 1204   FERRITIN 660 (H) 02/25/2018 1252   IRONPCTSAT 31 02/11/2018 1204    Home meds:  - alprazolam 0.125 hs/ citalopram 20  - prazosin 1mg  hs/ metoprolol tartrate 25 qam  - sitagliptin 25 qd/ insulin glargine 10- 30 unit hs  - allopurinol 200/ ecasa 81/ atorvastatin 10 / omeprazole 20/ imdur 30  - prns/ eyedrops / vitmains   Exam: Gen elderly, chronic ill appearing, pleasant, on RA today No jvd or bruits Chest clear bilat to bases RRR no  MRG Abd soft ntnd no mass or ascites +bs obese Ext no sig LE or dependent edema Neuro is alert, following commands LUA AVF+bruit  CXR 9/20 - new R basilar infiltrate, no edema  [facility]: [facility]   4h  76kg  3K/ 2Ca bath  LUA AVG  Hep none  - mircera 100 q 2   Impression/Plan: 1. ESRD - HD TTS. HD yest in ICU, next HD on 9/24 2. Vfib cardiac arrest - +trop and new WMA, per cards may need cath 3. CAD sp CABG (2013) 4. Volume - no gross excess one xam, stable 5. HTN - on 2 bp meds at home; here is on IV hydral prn 6. DM2 on insulin 7. Resp failrue - extubated yest, stable on RA 8. Fever/ ^wbc - on empiric IV abx vanc/ zosyn 9. Cdif +Ag/ -toxin - per primary   [name] MD [facility] pager [phone]   04/05/2018, 9:45 AM   Recent Labs  Lab 04/02/18 2043 04/03/18 0605 04/04/18 0239  NA 140 140 140  K 3.8 3.8 3.9  CL 100 102 102  CO2 25 23 24   GLUCOSE 269* 203* 206*  BUN 18 31* 51*  CREATININE 1.91* 2.42* 3.90*  CALCIUM 8.6* 8.4*  8.6*  PHOS  --  2.8  --   ALBUMIN 3.1*  --  2.2*  INR 1.04  --   --    Recent Labs  Lab 04/02/18 2043 04/04/18 0239  AST 196* 45*  ALT 128* 53*  ALKPHOS 91 58  BILITOT 0.7 0.6  PROT 6.7 5.3*   Recent Labs  Lab 04/02/18 2043  04/04/18 0239 04/05/18 0229  WBC 18.9*   < > 10.1 11.0*  NEUTROABS 15.1*  --   --   --   HGB 12.2   < > 8.7* 8.5*  HCT 40.4   < > 28.4* 27.6*  MCV 111.6*   < > 108.4* 109.1*  PLT 204   < > 147* 143*   < > = values in this interval not displayed.

## 2018-04-06 DIAGNOSIS — T17908A Unspecified foreign body in respiratory tract, part unspecified causing other injury, initial encounter: Secondary | ICD-10-CM

## 2018-04-06 DIAGNOSIS — J690 Pneumonitis due to inhalation of food and vomit: Secondary | ICD-10-CM

## 2018-04-06 LAB — GLUCOSE, CAPILLARY
GLUCOSE-CAPILLARY: 144 mg/dL — AB (ref 70–99)
GLUCOSE-CAPILLARY: 149 mg/dL — AB (ref 70–99)
Glucose-Capillary: 216 mg/dL — ABNORMAL HIGH (ref 70–99)

## 2018-04-06 LAB — CBC
HCT: 28.1 % — ABNORMAL LOW (ref 36.0–46.0)
HEMOGLOBIN: 8.8 g/dL — AB (ref 12.0–15.0)
MCH: 33.7 pg (ref 26.0–34.0)
MCHC: 31.3 g/dL (ref 30.0–36.0)
MCV: 107.7 fL — ABNORMAL HIGH (ref 78.0–100.0)
Platelets: 155 10*3/uL (ref 150–400)
RBC: 2.61 MIL/uL — ABNORMAL LOW (ref 3.87–5.11)
RDW: 14.6 % (ref 11.5–15.5)
WBC: 10.3 10*3/uL (ref 4.0–10.5)

## 2018-04-06 MED ORDER — PENTAFLUOROPROP-TETRAFLUOROETH EX AERO: 1.0000 "application " | INHALATION_SPRAY | CUTANEOUS | Status: DC | PRN

## 2018-04-06 MED ORDER — CHLORHEXIDINE GLUCONATE CLOTH 2 % EX PADS
6.0000 | MEDICATED_PAD | Freq: Every day | CUTANEOUS | Status: DC
  Administered 2018-04-07 – 2018-04-11 (×4): 6 via TOPICAL

## 2018-04-06 MED ORDER — CALCIUM CARBONATE ANTACID 500 MG PO CHEW
1.0000 | CHEWABLE_TABLET | Freq: Four times a day (QID) | ORAL | Status: DC | PRN
  Administered 2018-04-06 – 2018-04-13 (×3): 200 mg via ORAL
  Filled 2018-04-06 (×5): qty 1

## 2018-04-06 MED ORDER — LIDOCAINE HCL (PF) 1 % IJ SOLN: 5.0000 mL | INTRAMUSCULAR | Status: DC | PRN

## 2018-04-06 MED ORDER — HYDRALAZINE HCL 10 MG PO TABS
10.0000 mg | ORAL_TABLET | Freq: Three times a day (TID) | ORAL | Status: DC
  Administered 2018-04-06: 10 mg via ORAL
  Filled 2018-04-06: qty 1

## 2018-04-06 MED ORDER — ALUM & MAG HYDROXIDE-SIMETH 200-200-20 MG/5ML PO SUSP: 15.0000 mL | Freq: Four times a day (QID) | ORAL | Status: DC | PRN

## 2018-04-06 MED ORDER — LIDOCAINE-PRILOCAINE 2.5-2.5 % EX CREA
1.0000 "application " | TOPICAL_CREAM | CUTANEOUS | Status: DC | PRN
  Filled 2018-04-06: qty 5

## 2018-04-06 MED ORDER — METOPROLOL TARTRATE 25 MG PO TABS
50.0000 mg | ORAL_TABLET | Freq: Two times a day (BID) | ORAL | Status: DC
  Administered 2018-04-06 – 2018-04-09 (×7): 50 mg via ORAL
  Filled 2018-04-06: qty 2
  Filled 2018-04-06 (×2): qty 1
  Filled 2018-04-06 (×2): qty 2
  Filled 2018-04-06 (×2): qty 1

## 2018-04-06 MED ORDER — HEPARIN SODIUM (PORCINE) 1000 UNIT/ML DIALYSIS
1000.0000 [IU] | INTRAMUSCULAR | Status: DC | PRN
  Filled 2018-04-06: qty 1

## 2018-04-06 MED ORDER — HYDRALAZINE HCL 25 MG PO TABS
25.0000 mg | ORAL_TABLET | Freq: Three times a day (TID) | ORAL | Status: DC
  Administered 2018-04-06 – 2018-04-10 (×11): 25 mg via ORAL
  Filled 2018-04-06 (×11): qty 1

## 2018-04-06 MED ORDER — LIDOCAINE-PRILOCAINE 2.5-2.5 % EX CREA: 1.0000 "application " | TOPICAL_CREAM | CUTANEOUS | Status: DC | PRN

## 2018-04-06 MED ORDER — SODIUM CHLORIDE 0.9 % IV SOLN: 100.0000 mL | INTRAVENOUS | Status: DC | PRN

## 2018-04-06 MED ORDER — ALTEPLASE 2 MG IJ SOLR: 2.0000 mg | Freq: Once | INTRAMUSCULAR | Status: DC | PRN

## 2018-04-06 NOTE — Progress Notes (Addendum)
Pt in trauma B after cardiac event per family.  Met w/ husband and daughters.  Provided compassionate presence, their pastor was already informed and en route. Chaplain remains available via pg to support.  Myra Gianotti resident, [phone]

## 2018-04-06 NOTE — Progress Notes (Signed)
Met rest of family and their pastor in [facility] waiting room.  Compassionate presence.  Myra Gianotti resident, [phone]

## 2018-04-06 NOTE — Progress Notes (Signed)
Progress Note  Patient Name: [name] Date of Encounter: 04/06/2018  Primary Cardiologist: Larae Grooms, MD   Subjective   Less confused today.  Answering questions with yes and no.  Much improved from yesterday.  Has some anterior chest wall discomfort from CPR.  Inpatient Medications    Scheduled Meds: . aspirin  81 mg Oral Daily  . atorvastatin  40 mg Oral q1800  . B-complex with vitamin C  1 tablet Per Tube Daily  . brimonidine  1 drop Left Eye TID  . Chlorhexidine Gluconate Cloth  6 each Topical Q0600  . Chlorhexidine Gluconate Cloth  6 each Topical Q0600  . enoxaparin (LOVENOX) injection  30 mg Subcutaneous Q24H  . feeding supplement  1 Container Oral TID BM  . hydrALAZINE  25 mg Oral Q8H  . insulin aspart  2-6 Units Subcutaneous TID WC  . insulin glargine  15 Units Subcutaneous Daily  . latanoprost  1 drop Both Eyes QHS  . mouth rinse  15 mL Mouth Rinse BID  . metoprolol tartrate  50 mg Oral BID  . timolol  1 drop Left Eye Daily  . vancomycin  500 mg Oral Q6H   Continuous Infusions: . sodium chloride Stopped (04/06/18 0314)  . sodium chloride    . piperacillin-tazobactam (ZOSYN)  IV Stopped (04/06/18 0308)   PRN Meds: sodium chloride, acetaminophen, bisacodyl, docusate, Gerhardt's butt cream, hydrALAZINE, midazolam, midazolam   Vital Signs    Vitals:   04/06/18 0500 04/06/18 0600 04/06/18 0700 04/06/18 0730  BP: (!) 162/119 (!) 163/72 (!) 180/72   Pulse: 81 80 80   Resp: (!) 24 (!) 23 18   Temp:    98.2 F (36.8 C)  TempSrc:    Oral  SpO2: 90% 96% (!) 89%   Weight:  76.9 kg    Height:        Intake/Output Summary (Last 24 hours) at 04/06/2018 1059 Last data filed at 04/06/2018 9937 Gross per 24 hour  Intake 961.5 ml  Output 400 ml  Net 561.5 ml   Filed Weights   04/04/18 1200 04/05/18 0509 04/06/18 0600  Weight: 75.5 kg 78.3 kg 76.9 kg    Telemetry    Sinus rhythm, occasional PVCs, no sustained VT- Personally Reviewed  ECG      Most recent ECG shows sinus rhythm with T wave inversion and ST segment depression in the anterior precordial leads with QTC in the 530 range.  Prolonged period- Personally Reviewed  Physical Exam   GEN: No acute distress.  Laying comfortably in bed, answers some questions. Neck: No JVD Cardiac: RRR, no murmurs, rubs, or gallops.  Respiratory: Clear to auscultation bilaterally. GI: Soft, nontender, non-distended  MS: No edema; No deformity. Neuro:  Nonfocal, less confused Psych: Normal affect, less confused  Labs    Chemistry Recent Labs  Lab 04/02/18 2043 04/03/18 0605 04/04/18 0239  NA 140 140 140  K 3.8 3.8 3.9  CL 100 102 102  CO2 25 23 24   GLUCOSE 269* 203* 206*  BUN 18 31* 51*  CREATININE 1.91* 2.42* 3.90*  CALCIUM 8.6* 8.4* 8.6*  PROT 6.7  --  5.3*  ALBUMIN 3.1*  --  2.2*  AST 196*  --  45*  ALT 128*  --  53*  ALKPHOS 91  --  58  BILITOT 0.7  --  0.6  GFRNONAA 24* 18* 10*  GFRAA 28* 21* 12*  ANIONGAP 15 15 14      Hematology Recent Labs  Lab 04/04/18 0239 04/05/18 0229 04/06/18 0232  WBC 10.1 11.0* 10.3  RBC 2.62* 2.53* 2.61*  HGB 8.7* 8.5* 8.8*  HCT 28.4* 27.6* 28.1*  MCV 108.4* 109.1* 107.7*  MCH 33.2 33.6 33.7  MCHC 30.6 30.8 31.3  RDW 14.9 14.9 14.6  PLT 147* 143* 155    Cardiac Enzymes Recent Labs  Lab 04/03/18 0054 04/03/18 0605  TROPONINI 1.17* 1.14*    Recent Labs  Lab 04/02/18 1916  TROPIPOC 0.20*     BNPNo results for input(s): BNP, PROBNP in the last 168 hours.   DDimer No results for input(s): DDIMER in the last 168 hours.   Radiology    No results found.  Cardiac Studies   04/03/2018 echo EF 45 to 50% calcified mitral valve annulus mild regurgitation akinesis of basal to mid inferior and inferior lateral walls.  Patient Profile     77 y.o. female with VF arrest, prolonged QT, CAD post CABG, end-stage renal disease with suspected aspiration pneumonia confusion  Assessment & Plan    VF arrest - No angina,  prolonged QT noted.  CPR.  Troponin 1.1.  Frequent PVCs bigeminy on presentation marked improved.  Echo with mildly reduced EF with inferolateral wall motion abnormality.  Could be circumflex.  Now that she is improving mentally, we can likely proceed with cardiac catheterization either tomorrow, Tuesday or Wednesday.  We will go ahead and tentatively set her up for tomorrow.  She also has hemodialysis tomorrow as well.  Prolonged QT -For now continue to avoid Celexa.  Look for ischemia.  End-stage renal disease -Hemodialysis on Tuesday.  Nephrology note reviewed.  Aspiration pneumonia suspected -Improved.  Essential hypertension - I will increase her hydralazine to 25 every 8 and increase her metoprolol to 50 twice daily.  I understand that she may drop during hemodialysis.  May need to be adjusted.  Enteric precautions noted.     For questions or updates, please contact [place] Please consult [email or web address] for contact info under        Signed, Candee Furbish, MD  04/06/2018, 10:59 AM

## 2018-04-06 NOTE — Progress Notes (Signed)
[facility] NEPHROLOGY PROGRESS NOTE  Assessment/ Plan: Pt is a 77 y.o. yo female ESRD, cardiac arrest at home  Dialysis:[facility]  4h 76kg 3K/ 2Ca bath LUA AVG Hep none - mircera 100 q 2  #VF arrest: Positive troponin echo with new wall motion abnormalities.  Cardiology recommended cardiac cath.  # ESRD: TTS, last dialysis on Saturday.  Plan for next dialysis tomorrow.  Left upper extremity AV graft for access.  # Anemia: Hemoglobin 8.8. Check iron stores.   # Secondary hyperparathyroidism: ca ok, phos 2.8. Not on binders.   # HTN/volume: Blood pressure elevated.  Continue metoprolol.  Starting hydralazine 3 times daily.  Monitor blood pressure.  Volume status looks acceptable.    Discussed with the patient and family.   Subjective: Seen and examined at bedside.  Patient is alert awake and following simple commands.  Denies chest pain, shortness of breath.  Family at bedside. Objective Vital signs in last 24 hours: Vitals:   04/06/18 0500 04/06/18 0600 04/06/18 0700 04/06/18 0730  BP: (!) 162/119 (!) 163/72 (!) 180/72   Pulse: 81 80 80   Resp: (!) 24 (!) 23 18   Temp:    98.2 F (36.8 C)  TempSrc:    Oral  SpO2: 90% 96% (!) 89%   Weight:  76.9 kg    Height:       Weight change: 1.4 kg  Intake/Output Summary (Last 24 hours) at 04/06/2018 0830 Last data filed at 04/06/2018 1610 Gross per 24 hour  Intake 1052.66 ml  Output 400 ml  Net 652.66 ml       Labs: Basic Metabolic Panel: Recent Labs  Lab 04/02/18 2043 04/03/18 0605 04/04/18 0239  NA 140 140 140  K 3.8 3.8 3.9  CL 100 102 102  CO2 25 23 24   GLUCOSE 269* 203* 206*  BUN 18 31* 51*  CREATININE 1.91* 2.42* 3.90*  CALCIUM 8.6* 8.4* 8.6*  PHOS  --  2.8  --    Liver Function Tests: Recent Labs  Lab 04/02/18 2043 04/04/18 0239  AST 196* 45*  ALT 128* 53*  ALKPHOS 91 58  BILITOT 0.7 0.6  PROT 6.7 5.3*  ALBUMIN 3.1* 2.2*   No results for input(s): LIPASE, AMYLASE in the last  168 hours. No results for input(s): AMMONIA in the last 168 hours. CBC: Recent Labs  Lab 04/02/18 2043 04/03/18 0605 04/04/18 0239 04/05/18 0229 04/06/18 0232  WBC 18.9* 15.9* 10.1 11.0* 10.3  NEUTROABS 15.1*  --   --   --   --   HGB 12.2 10.8* 8.7* 8.5* 8.8*  HCT 40.4 34.8* 28.4* 27.6* 28.1*  MCV 111.6* 107.7* 108.4* 109.1* 107.7*  PLT 204 146* 147* 143* 155   Cardiac Enzymes: Recent Labs  Lab 04/03/18 0054 04/03/18 0605  TROPONINI 1.17* 1.14*   CBG: Recent Labs  Lab 04/05/18 0658 04/05/18 1159 04/05/18 1640 04/05/18 2245 04/06/18 0639  GLUCAP 140* 226* 151* 206* 144*    Iron Studies: No results for input(s): IRON, TIBC, TRANSFERRIN, FERRITIN in the last 72 hours. Studies/Results: No results found.  Medications: Infusions: . sodium chloride Stopped (04/06/18 0314)  . sodium chloride    . piperacillin-tazobactam (ZOSYN)  IV Stopped (04/06/18 0308)    Scheduled Medications: . aspirin  81 mg Oral Daily  . atorvastatin  40 mg Oral q1800  . B-complex with vitamin C  1 tablet Per Tube Daily  . brimonidine  1 drop Left Eye TID  . Chlorhexidine Gluconate Cloth  6 each Topical Q0600  . enoxaparin (LOVENOX) injection  30 mg Subcutaneous Q24H  . feeding supplement  1 Container Oral TID BM  . insulin aspart  2-6 Units Subcutaneous TID WC  . insulin glargine  15 Units Subcutaneous Daily  . latanoprost  1 drop Both Eyes QHS  . mouth rinse  15 mL Mouth Rinse BID  . metoprolol tartrate  25 mg Oral BID  . timolol  1 drop Left Eye Daily  . vancomycin  500 mg Oral Q6H    have reviewed scheduled and prn medications.  Physical Exam: General:NAD, comfortable Heart:RRR, s1s2 nl Lungs: Bibasilar coarse breath sound, no wheezing Abdomen:soft, Non-tender, non-distended Extremities:No edema Dialysis Access: Left upper extremity AV graft has good bruit.  Oswin Johal Prasad Kushal Saunders 04/06/2018,8:30 AM  LOS: 4 days

## 2018-04-06 NOTE — Progress Notes (Signed)
NAME:  [name], MRN:  [id number], DOB:  [birth year], LOS: 4 ADMISSION DATE:  04/02/2018, CONSULTATION DATE:  04/02/18 REFERRING MD:  Ralene Bathe CHIEF COMPLAINT:  Cardiac Arrest   Brief History   [name] is a 77 y.o. female who has extensive PMH, presented to [facility] ED 9/19 after cardiac arrest at home possibly following a choking event (family heard her plate drop and thought she might have choked).  Family started CPR while awaiting EMS.  On EMS arrival, she was shocked once for VF before developing asystole.  Total of roughly 12 - 13 minutes of downtime prior to ROSC (including time that family provided CPR).  Significant Hospital Events   9/19 > admit. 9/20: Stable overnight however troponins did rise slightly.  Having loose stools, reporting chest discomfort to palpation.  Starting oral Vanco for C. difficile antigen positive, diarrhea, and concomitant antibiotic administration.  04/04/18 extubated  Consults: date of consult/date signed off & final recs:  Cardiology, 9/19 >  Nephrology 9/20>>> Procedures (surgical and bedside):  ETT 9/19 > 9/21  Significant Diagnostic Tests:  CT head 9/19 > age related volume loss. Echo 9/20 > Impressions: Akinesis of the basal/mid inferior and inferolateral walls with   overall mildly reduced LV systolic function; mild diastolic   dysfunction; mild LVH; mild MR.  Micro Data: Blood 9/19 >  Sputum 9/19: Few gram-positive cocci few yeast>>>normal flora  C. Difficile 9/20>>> Antigen positive toxin negative Antimicrobials:  Zosyn 9/19 >planned 5 days through 9/24 Vancomycin 9/19> 9/22 Oral vancomycin 9/20 Subjective:    Objective   Blood pressure (!) 180/72, pulse 80, temperature 98.2 F (36.8 C), temperature source Oral, resp. rate 18, height 5\' 4"  (1.626 m), weight 76.9 kg, SpO2 (!) 89 %.    Intake/Output Summary (Last 24 hours) at 04/06/2018 4098 Last data filed at 04/06/2018 1191 Gross per 24 hour  Intake 1052.66 ml  Output 400 ml    Net 652.66 ml   Filed Weights   04/04/18 1200 04/05/18 0509 04/06/18 0600  Weight: 75.5 kg 78.3 kg 76.9 kg    Examination: General:  Chronically ill appearing female, NAD in bed  HEENT: MM pink/moist Neuro: awake, answers questions, oriented x 2, some confusion at times, follows commands CV: s1s2 rrr, no m/r/g PULM: even/non-labored, lungs bilaterally clear  YN:WGNF, non-tender, bsx4 active, liquid stool from flexi-seal  Extremities: warm/dry, no edema  Skin: no rashes or lesions    Assessment & Plan:   Ventricular fibrillation cardiac Arrest - possibly following choking event, but not definite. Family reports recent episode of syncope after HD two weeks ago. Had HD day of event.  Echo now with new inferior/inferolateral wall motion abnormality with mild LV dysfunction Hx HTN, HLD, CAD, PAF, MI.  Plan Cardiology following - will need cath  Continue ASA, statin  Had IV heparin >48hrs - now on DVT proph dose  Monitor tele    Respiratory insufficiency - due to inability to protect the airway in the setting of cardiac arrest. Extubated 9/21.  Aspiration pneumonia Plan Mobilize  pulm hygiene  Complete 5 days of Zosyn   Diarrhea.  C. difficile antigen positive toxin negative. -In light of ongoing antibiotic support and diarrhea we will treat this Plan Vancomycin po. Complete a 10 day course.  Elevated lactate - presumed due to cardiac arrest.  Trended down Plan Monitor   ESRD on HD TTS - last session Thurs 9/19. Plan Renal following  Plan for HD 9/24 F/u chem   Hx DM.  Plan Holding preadmission meds Sliding scale insulin lantus 15 units daily  Anemia of chronic disease no evidence of bleeding Mild thrombocytopenia Plan Serial CBCs. Monitor for active bleeding  Disposition / Summary of Today's Plan 04/06/18   tx tele, [facility] to assume care 9/24 Cardiology following for timing of further ischemic w/u     Diet: heart healthy, carb  controlled. Pain/Anxiety/Delirium protocol (if indicated): n/a DVT prophylaxis: Subq heparin GI prophylaxis: n/a Hyperglycemia protocol: SSI, lantus insulin Mobility: Bedrest Code Status: Full. Family Communication: Husband and granddaughter were updated at bedside  Labs   CBC: Recent Labs  Lab 04/02/18 2043 04/03/18 7793 04/04/18 0239 04/05/18 0229 04/06/18 0232  WBC 18.9* 15.9* 10.1 11.0* 10.3  NEUTROABS 15.1*  --   --   --   --   HGB 12.2 10.8* 8.7* 8.5* 8.8*  HCT 40.4 34.8* 28.4* 27.6* 28.1*  MCV 111.6* 107.7* 108.4* 109.1* 107.7*  PLT 204 146* 147* 143* 903   Basic Metabolic Panel: Recent Labs  Lab 04/02/18 1918 04/02/18 2043 04/03/18 0605 04/04/18 0239  NA 138 140 140 140  K 4.0 3.8 3.8 3.9  CL 100 100 102 102  CO2  --  25 23 24   GLUCOSE 276* 269* 203* 206*  BUN 21 18 31* 51*  CREATININE 2.00* 1.91* 2.42* 3.90*  CALCIUM  --  8.6* 8.4* 8.6*  MG  --   --  1.8  --   PHOS  --   --  2.8  --    GFR: Estimated Creatinine Clearance: 11.9 mL/min (A) (by C-G formula based on SCr of 3.9 mg/dL (H)). Recent Labs  Lab 04/02/18 1918  04/02/18 2044 04/02/18 2049 04/03/18 0054 04/03/18 0605 04/04/18 0239 04/05/18 0229 04/06/18 0232  PROCALCITON  --   --   --   --   --  1.72 8.23 7.94  --   WBC  --    < >  --   --   --  15.9* 10.1 11.0* 10.3  LATICACIDVEN 5.11*  --  3.4* 3.33* 2.6*  --   --   --   --    < > = values in this interval not displayed.   Liver Function Tests: Recent Labs  Lab 04/02/18 2043 04/04/18 0239  AST 196* 45*  ALT 128* 53*  ALKPHOS 91 58  BILITOT 0.7 0.6  PROT 6.7 5.3*  ALBUMIN 3.1* 2.2*   No results for input(s): LIPASE, AMYLASE in the last 168 hours. No results for input(s): AMMONIA in the last 168 hours. ABG    Component Value Date/Time   PHART 7.388 04/03/2018 0406   PCO2ART 41.1 04/03/2018 0406   PO2ART 85.0 04/03/2018 0406   HCO3 24.2 04/03/2018 0406   TCO2 27 04/02/2018 1939   ACIDBASEDEF 0.2 04/03/2018 0406   O2SAT  95.2 04/03/2018 0406    Coagulation Profile: Recent Labs  Lab 04/02/18 2043  INR 1.04   Cardiac Enzymes: Recent Labs  Lab 04/03/18 0054 04/03/18 0605  TROPONINI 1.17* 1.14*   HbA1C: Hgb A1c MFr Bld  Date/Time Value Ref Range Status  11/23/2015 11:46 AM 7.1 (H) 4.8 - 5.6 % Final    Comment:    (NOTE)         Pre-diabetes: 5.7 - 6.4         Diabetes: >6.4         Glycemic control for adults with diabetes: <7.0   07/13/2015 04:30 PM 7.0 (H) 4.8 - 5.6 % Final    Comment:    (  NOTE)         Pre-diabetes: 5.7 - 6.4         Diabetes: >6.4         Glycemic control for adults with diabetes: <7.0    CBG: Recent Labs  Lab 04/05/18 0658 04/05/18 1159 04/05/18 1640 04/05/18 2245 04/06/18 [place], NP 04/06/2018  9:27 AM Pager: [phone] or [phone]

## 2018-04-07 LAB — CBC
HCT: 29.3 % — ABNORMAL LOW (ref 36.0–46.0)
Hemoglobin: 9.4 g/dL — ABNORMAL LOW (ref 12.0–15.0)
MCH: 34.1 pg — ABNORMAL HIGH (ref 26.0–34.0)
MCHC: 32.1 g/dL (ref 30.0–36.0)
MCV: 106.2 fL — AB (ref 78.0–100.0)
PLATELETS: 166 10*3/uL (ref 150–400)
RBC: 2.76 MIL/uL — AB (ref 3.87–5.11)
RDW: 14.4 % (ref 11.5–15.5)
WBC: 9.1 10*3/uL (ref 4.0–10.5)

## 2018-04-07 LAB — BASIC METABOLIC PANEL
Anion gap: 19 — ABNORMAL HIGH (ref 5–15)
BUN: 67 mg/dL — ABNORMAL HIGH (ref 8–23)
CO2: 25 mmol/L (ref 22–32)
CREATININE: 5.93 mg/dL — AB (ref 0.44–1.00)
Calcium: 9.4 mg/dL (ref 8.9–10.3)
Chloride: 93 mmol/L — ABNORMAL LOW (ref 98–111)
GFR, EST AFRICAN AMERICAN: 7 mL/min — AB (ref >60)
GFR, EST NON AFRICAN AMERICAN: 6 mL/min — AB (ref >60)
GLUCOSE: 83 mg/dL (ref 70–99)
Potassium: 4.2 mmol/L (ref 3.5–5.1)
Sodium: 137 mmol/L (ref 135–145)

## 2018-04-07 LAB — GLUCOSE, CAPILLARY
GLUCOSE-CAPILLARY: 244 mg/dL — AB (ref 70–99)
GLUCOSE-CAPILLARY: 71 mg/dL (ref 70–99)
Glucose-Capillary: 119 mg/dL — ABNORMAL HIGH (ref 70–99)
Glucose-Capillary: 86 mg/dL (ref 70–99)
Glucose-Capillary: 205 mg/dL — ABNORMAL HIGH (ref 70–99)

## 2018-04-07 LAB — CULTURE, BLOOD (ROUTINE X 2)
CULTURE: NO GROWTH
CULTURE: NO GROWTH

## 2018-04-07 LAB — IRON AND TIBC
IRON: 34 ug/dL (ref 28–170)
Saturation Ratios: 15 % (ref 10.4–31.8)
TIBC: 220 ug/dL — ABNORMAL LOW (ref 250–450)
UIBC: 186 ug/dL

## 2018-04-07 LAB — FERRITIN: Ferritin: 1432 ng/mL — ABNORMAL HIGH (ref 11–307)

## 2018-04-07 MED ORDER — SODIUM CHLORIDE 0.9% FLUSH
3.0000 mL | Freq: Two times a day (BID) | INTRAVENOUS | Status: DC
  Administered 2018-04-07 – 2018-04-08 (×2): 3 mL via INTRAVENOUS

## 2018-04-07 MED ORDER — SODIUM CHLORIDE 0.9 % IV SOLN
125.0000 mg | INTRAVENOUS | Status: DC
  Administered 2018-04-07 – 2018-04-14 (×2): 125 mg via INTRAVENOUS
  Filled 2018-04-07 (×3): qty 10

## 2018-04-07 MED ORDER — SODIUM CHLORIDE 0.9 % IV SOLN: INTRAVENOUS | Status: DC

## 2018-04-07 MED ORDER — SODIUM CHLORIDE 0.9% FLUSH: 3.0000 mL | INTRAVENOUS | Status: DC | PRN

## 2018-04-07 MED ORDER — VANCOMYCIN 50 MG/ML ORAL SOLUTION
125.0000 mg | Freq: Four times a day (QID) | ORAL | Status: AC
  Administered 2018-04-07 – 2018-04-12 (×21): 125 mg via ORAL
  Filled 2018-04-07 (×28): qty 2.5

## 2018-04-07 MED ORDER — SODIUM CHLORIDE 0.9 % IV SOLN: 250.0000 mL | INTRAVENOUS | Status: DC | PRN

## 2018-04-07 NOTE — Progress Notes (Signed)
Rehab Admissions Coordinator Note:  Patient was screened by Cleatrice Burke for appropriateness for an Inpatient Acute Rehab Consult per PT recommendation.   At this time, we are recommending Inpatient Rehab consult. Please place an order for an inpt rehab admit if pt/family would like her to be considered for admit. Please advise.   Danne Baxter, RN, MSN Rehab Admissions Coordinator [phone] 04/07/2018 6:05 PM

## 2018-04-07 NOTE — Progress Notes (Signed)
PROGRESS NOTE    [name]  WCH:[id number] DOB: [birth year] DOA: 04/02/2018 PCP: Josetta Huddle, MD      Brief Narrative:  [name] is a 77 y.o. F with ESRD on HD TThS, coronary disease, HTN, and IDDM who presented with VF arrest.  Not cooled because was able to interact immediately after resuscitation.  Also diarrhea on admission, Cdiff antigen positive and suspected aspiration pneumonia.     Assessment & Plan:  VF arrest 12 minutes OOH CPR.  ROSC by time of arrival to ER.  Not cooled due to ability to interact on arrival to hospital.  Echo now with reduced EF, RWMA.  Got 48 hours heparin gtt. -Consult cardiology -Continue aspirin, atorvastatin -Cardiology directing plans for cardiac geography   C. difficile diarrhea Profuse foul-smelling diarrhea at admission.  C. difficile antigen positive, toxin negative. -Continue oral vancomycin, reduce dose today to 125, day 5 of 10  Aspiration pneumonia Completed 5 days of Zosyn.  WBC resolved.  ESRD on HD -Consult nephrology, appreciate cares  Diabetes This is well controlled -Continue glargine -Continue sliding scale corrections  Anemia of chronic renal insufficiency Stable  Thrombocytopenia Resolved  Prolonged QTc -Continue telemetry -Avoid QT prolonging agents      DVT prophylaxis: Lovenox Code Status: FULL Family Communication: None present MDM and disposition Plan: The below labs and imaging reports were reviewed and summarized above.  Medication management as above.  The patient was admitted with VF arrest.  Did not require cooling.  Echo shows new RWMA.  Cardiology planning angio.  Once angiogram complete, will progress towards rehab placement.  PT ordered.   Consultants:   Cardiology  Nephrology  CCM  Procedures:   Intubation 9/20  Extubation 9/21  Echocardiogram 9/20 LV EF: 45% -   50%  ------------------------------------------------------------------- Indications:      Cardiac  arrest 427.5.  ------------------------------------------------------------------- History:   PMH:  Chronic kidney disease.  Atrial fibrillation. Coronary artery disease.  Congestive heart failure.  Risk factors: Hypertension. Dyslipidemia.  ------------------------------------------------------------------- Study Conclusions  - Left ventricle: The cavity size was normal. Wall thickness was   increased in a pattern of mild LVH. Systolic function was mildly   reduced. The estimated ejection fraction was in the range of 45%   to 50%. There is akinesis of the basal-midinferolateral and   inferior myocardium. Doppler parameters are consistent with   abnormal left ventricular relaxation (grade 1 diastolic   dysfunction). - Mitral valve: Calcified annulus. There was mild regurgitation.  Impressions:  - Akinesis of the basal/mid inferior and inferolateral walls with   overall mildly reduced LV systolic function; mild diastolic   dysfunction; mild LVH; mild MR.   Angiography planned  Antimicrobials:   Zosyn 9/19 >> 9/24  Oral vancomycin 9/20 >>    Subjective: Chest tenderness still present to palpation.  No dyspnea.  Cough present.  No fever.  No confusion.  Still very sleepy.  Objective: Vitals:   04/07/18 0730 04/07/18 0735 04/07/18 0800 04/07/18 0830  BP: (!) 181/59 (!) 155/79 (!) 170/67 (!) 124/51  Pulse: 74 71 78 77  Resp: 18 18 18 17   Temp: 98.9 F (37.2 C) 98.9 F (37.2 C)    TempSrc: Oral Oral    SpO2: 100% 100%  100%  Weight:      Height:        Intake/Output Summary (Last 24 hours) at 04/07/2018 3536 Last data filed at 04/06/2018 2021 Gross per 24 hour  Intake 240 ml  Output -  Net  240 ml   Filed Weights   04/06/18 0600 04/07/18 0551 04/07/18 0720  Weight: 76.9 kg 83.6 kg 83.6 kg    Examination: General appearance: Obese adult female, sleeping but rouses to noxious stimuli, no acute distress.   HEENT: Anicteric, conjunctiva pink, lids and lashes  normal. No nasal deformity, discharge, epistaxis.  Lips moist, OP tacky drry, no oral lesions, hearing normal.   Skin: Warm and dry.  No jaundice.   Cardiac: Regular rate, irregular, nl S1-S2, no murmurs appreciated.  Capillary refill is brisk.  JVP not visible.  No LE edema.  Radia pulses 2+ and symmetric. Respiratory: Normal respiratory rate and rhythm.  CTAB without rales or wheezes. Abdomen: Abdomen soft.  No TTP. No ascites, distension, hepatosplenomegaly.   MSK: No deformities or effusions in the large joints of the upper or lower extremities.. Neuro: Sleeping on dialysis but rousable.  Makes eye contact. EOMI, moves all extremities weakly. Speech fluent.    Psych: Sensorium intact and responding to questions, oriented to dialysis center, but thinks she is in [place], attention diminshed. Affect blunted.  Judgment and insight appear poor.    Data Reviewed: I have personally reviewed following labs and imaging studies:  CBC: Recent Labs  Lab 04/02/18 2043 04/03/18 0605 04/04/18 0239 04/05/18 0229 04/06/18 0232 04/07/18 0535  WBC 18.9* 15.9* 10.1 11.0* 10.3 9.1  NEUTROABS 15.1*  --   --   --   --   --   HGB 12.2 10.8* 8.7* 8.5* 8.8* 9.4*  HCT 40.4 34.8* 28.4* 27.6* 28.1* 29.3*  MCV 111.6* 107.7* 108.4* 109.1* 107.7* 106.2*  PLT 204 146* 147* 143* 155 937   Basic Metabolic Panel: Recent Labs  Lab 04/02/18 1918 04/02/18 2043 04/03/18 0605 04/04/18 0239 04/07/18 0535  NA 138 140 140 140 137  K 4.0 3.8 3.8 3.9 4.2  CL 100 100 102 102 93*  CO2  --  25 23 24 25   GLUCOSE 276* 269* 203* 206* 83  BUN 21 18 31* 51* 67*  CREATININE 2.00* 1.91* 2.42* 3.90* 5.93*  CALCIUM  --  8.6* 8.4* 8.6* 9.4  MG  --   --  1.8  --   --   PHOS  --   --  2.8  --   --    GFR: Estimated Creatinine Clearance: 8.2 mL/min (A) (by C-G formula based on SCr of 5.93 mg/dL (H)). Liver Function Tests: Recent Labs  Lab 04/02/18 2043 04/04/18 0239  AST 196* 45*  ALT 128* 53*  ALKPHOS 91 58  BILITOT  0.7 0.6  PROT 6.7 5.3*  ALBUMIN 3.1* 2.2*   No results for input(s): LIPASE, AMYLASE in the last 168 hours. No results for input(s): AMMONIA in the last 168 hours. Coagulation Profile: Recent Labs  Lab 04/02/18 2043  INR 1.04   Cardiac Enzymes: Recent Labs  Lab 04/03/18 0054 04/03/18 0605  TROPONINI 1.17* 1.14*   BNP (last 3 results) No results for input(s): PROBNP in the last 8760 hours. HbA1C: No results for input(s): HGBA1C in the last 72 hours. CBG: Recent Labs  Lab 04/06/18 0639 04/06/18 1157 04/06/18 1705 04/07/18 0025 04/07/18 0649  GLUCAP 144* 216* 149* 71 119*   Lipid Profile: No results for input(s): CHOL, HDL, LDLCALC, TRIG, CHOLHDL, LDLDIRECT in the last 72 hours. Thyroid Function Tests: No results for input(s): TSH, T4TOTAL, FREET4, T3FREE, THYROIDAB in the last 72 hours. Anemia Panel: Recent Labs    04/07/18 0535  FERRITIN 1,432*  TIBC 220*  IRON 34  Urine analysis:    Component Value Date/Time   COLORURINE YELLOW 12/21/2015 2222   APPEARANCEUR CLEAR 12/21/2015 2222   LABSPEC 1.013 12/21/2015 2222   PHURINE 6.5 12/21/2015 2222   GLUCOSEU NEGATIVE 12/21/2015 2222   HGBUR MODERATE (A) 12/21/2015 2222   BILIRUBINUR NEGATIVE 12/21/2015 2222   KETONESUR NEGATIVE 12/21/2015 2222   PROTEINUR >300 (A) 12/21/2015 2222   UROBILINOGEN 0.2 04/19/2015 1248   NITRITE POSITIVE (A) 12/21/2015 2222   LEUKOCYTESUR TRACE (A) 12/21/2015 2222   Sepsis Labs: @LABRCNTIP (procalcitonin:4,lacticacidven:4)  ) Recent Results (from the past 240 hour(s))  Culture, blood (routine x 2)     Status: None   Collection Time: 04/02/18  8:00 PM  Result Value Ref Range Status   Specimen Description BLOOD LEFT UPPER ARM  Final   Special Requests   Final    BOTTLES DRAWN AEROBIC ONLY Blood Culture results may not be optimal due to an inadequate volume of blood received in culture bottles   Culture   Final    NO GROWTH 5 DAYS Performed at [facility], [place] [address] [place], [place] [address]    Report Status 04/07/2018 FINAL  Final  Culture, blood (routine x 2)     Status: None   Collection Time: 04/02/18  8:37 PM  Result Value Ref Range Status   Specimen Description BLOOD RIGHT ANTECUBITAL  Final   Special Requests   Final    BOTTLES DRAWN AEROBIC AND ANAEROBIC Blood Culture results may not be optimal due to an inadequate volume of blood received in culture bottles   Culture   Final    NO GROWTH 5 DAYS Performed at [facility], [place] [address], [place], [place] [address]    Report Status 04/07/2018 FINAL  Final  MRSA PCR Screening     Status: None   Collection Time: 04/02/18 11:37 PM  Result Value Ref Range Status   MRSA by PCR NEGATIVE NEGATIVE Final    Comment:        The GeneXpert MRSA Assay (FDA approved for NASAL specimens only), is one component of a comprehensive MRSA colonization surveillance program. It is not intended to diagnose MRSA infection nor to guide or monitor treatment for MRSA infections. Performed at [facility], [place] [address] [place], [place] [address]   C difficile quick scan w PCR reflex     Status: Abnormal   Collection Time: 04/03/18  2:29 AM  Result Value Ref Range Status   C Diff antigen POSITIVE (A) NEGATIVE Final   C Diff toxin NEGATIVE NEGATIVE Final   C Diff interpretation Results are indeterminate. See PCR results.  Final    Comment: Performed at [facility], [place] [address], [place], [place] [address]  C. Diff by PCR, Reflexed     Status: Abnormal   Collection Time: 04/03/18  2:29 AM  Result Value Ref Range Status   Toxigenic C. Difficile by PCR POSITIVE (A) NEGATIVE Final    Comment: Positive for toxigenic C. difficile with little to no toxin production. Only treat if clinical presentation suggests symptomatic illness. Performed at [facility], [place] [address] [place], [place] [address]   Culture, respiratory (non-expectorated)     Status: None   Collection Time:  04/03/18  3:25 AM  Result Value Ref Range Status   Specimen Description TRACHEAL ASPIRATE  Final   Special Requests NONE  Final   Gram Stain   Final    MODERATE WBC PRESENT,  PREDOMINANTLY MONONUCLEAR FEW GRAM POSITIVE COCCI FEW YEAST    Culture   Final    Consistent with normal respiratory flora. Performed at [facility], [place] [address] [place], [place] [address]    Report Status 04/05/2018 FINAL  Final         Radiology Studies: No results found.      Scheduled Meds: . aspirin  81 mg Oral Daily  . atorvastatin  40 mg Oral q1800  . B-complex with vitamin C  1 tablet Per Tube Daily  . brimonidine  1 drop Left Eye TID  . Chlorhexidine Gluconate Cloth  6 each Topical Q0600  . Chlorhexidine Gluconate Cloth  6 each Topical Q0600  . enoxaparin (LOVENOX) injection  30 mg Subcutaneous Q24H  . feeding supplement  1 Container Oral TID BM  . hydrALAZINE  25 mg Oral Q8H  . insulin aspart  2-6 Units Subcutaneous TID WC  . insulin glargine  15 Units Subcutaneous Daily  . latanoprost  1 drop Both Eyes QHS  . mouth rinse  15 mL Mouth Rinse BID  . metoprolol tartrate  50 mg Oral BID  . timolol  1 drop Left Eye Daily  . vancomycin  125 mg Oral QID   Continuous Infusions: . sodium chloride 10 mL/hr at 04/06/18 2355  . sodium chloride    . sodium chloride    . sodium chloride    . piperacillin-tazobactam (ZOSYN)  IV 3.375 g (04/06/18 2357)     LOS: 5 days    Time spent: 71 minutes    Edwin Dada, MD [facility] 04/07/2018, 9:28 AM     Pager [phone] --- please page though AMION:  [email or web address] If 7PM-7AM, please contact night-coverage

## 2018-04-07 NOTE — Progress Notes (Signed)
[facility] NEPHROLOGY PROGRESS NOTE  Assessment/ Plan: Pt is a 77 y.o. yo female ESRD, cardiac arrest at home  Dialysis:[facility]  4h 76kg 3K/ 2Ca bath LUA AVG Hep none - mircera 100 q 2  #VF arrest: Positive troponin echo with new wall motion abnormalities.  Cardiology planning cardiac cath either today or tomorrow.   # ESRD: TTS, HD today, tolerating well.  Left upper extremity AV graft for access.  # Anemia: Hemoglobin 9.4. Iron sat 15 %, ferritin 1432, Start weekly nulecit.   # Secondary hyperparathyroidism: ca ok, phos 2.8. Not on binders.   # HTN/volume: Blood pressure better during dialysis. BP 124/51. On metoprol. Started hydralazine on 9/23.  Monitor blood pressure.  Volume status looks acceptable.    Subjective: Seen and examined at bedside.  Transferred out from ICU.  Seen during dialysis.  Denied nausea vomiting, chest pain or shortness of breath. Objective Vital signs in last 24 hours: Vitals:   04/07/18 0730 04/07/18 0735 04/07/18 0800 04/07/18 0830  BP: (!) 181/59 (!) 155/79 (!) 170/67 (!) 124/51  Pulse: 74 71 78 77  Resp: 18 18 18 17   Temp: 98.9 F (37.2 C) 98.9 F (37.2 C)    TempSrc: Oral Oral    SpO2: 100% 100%  100%  Weight:      Height:       Weight change: 6.7 kg  Intake/Output Summary (Last 24 hours) at 04/07/2018 0932 Last data filed at 04/06/2018 2021 Gross per 24 hour  Intake 240 ml  Output -  Net 240 ml       Labs: Basic Metabolic Panel: Recent Labs  Lab 04/03/18 0605 04/04/18 0239 04/07/18 0535  NA 140 140 137  K 3.8 3.9 4.2  CL 102 102 93*  CO2 23 24 25   GLUCOSE 203* 206* 83  BUN 31* 51* 67*  CREATININE 2.42* 3.90* 5.93*  CALCIUM 8.4* 8.6* 9.4  PHOS 2.8  --   --    Liver Function Tests: Recent Labs  Lab 04/02/18 2043 04/04/18 0239  AST 196* 45*  ALT 128* 53*  ALKPHOS 91 58  BILITOT 0.7 0.6  PROT 6.7 5.3*  ALBUMIN 3.1* 2.2*   No results for input(s): LIPASE, AMYLASE in the last 168 hours. No  results for input(s): AMMONIA in the last 168 hours. CBC: Recent Labs  Lab 04/02/18 2043 04/03/18 0605 04/04/18 0239 04/05/18 0229 04/06/18 0232 04/07/18 0535  WBC 18.9* 15.9* 10.1 11.0* 10.3 9.1  NEUTROABS 15.1*  --   --   --   --   --   HGB 12.2 10.8* 8.7* 8.5* 8.8* 9.4*  HCT 40.4 34.8* 28.4* 27.6* 28.1* 29.3*  MCV 111.6* 107.7* 108.4* 109.1* 107.7* 106.2*  PLT 204 146* 147* 143* 155 166   Cardiac Enzymes: Recent Labs  Lab 04/03/18 0054 04/03/18 0605  TROPONINI 1.17* 1.14*   CBG: Recent Labs  Lab 04/06/18 0639 04/06/18 1157 04/06/18 1705 04/07/18 0025 04/07/18 0649  GLUCAP 144* 216* 149* 71 119*    Iron Studies:  Recent Labs    04/07/18 0535  IRON 34  TIBC 220*  FERRITIN 1,432*   Studies/Results: No results found.  Medications: Infusions: . sodium chloride 10 mL/hr at 04/06/18 2355  . sodium chloride    . sodium chloride    . sodium chloride    . piperacillin-tazobactam (ZOSYN)  IV 3.375 g (04/06/18 2357)    Scheduled Medications: . aspirin  81 mg Oral Daily  . atorvastatin  40 mg Oral q1800  .  B-complex with vitamin C  1 tablet Per Tube Daily  . brimonidine  1 drop Left Eye TID  . Chlorhexidine Gluconate Cloth  6 each Topical Q0600  . Chlorhexidine Gluconate Cloth  6 each Topical Q0600  . enoxaparin (LOVENOX) injection  30 mg Subcutaneous Q24H  . feeding supplement  1 Container Oral TID BM  . hydrALAZINE  25 mg Oral Q8H  . insulin aspart  2-6 Units Subcutaneous TID WC  . insulin glargine  15 Units Subcutaneous Daily  . latanoprost  1 drop Both Eyes QHS  . mouth rinse  15 mL Mouth Rinse BID  . metoprolol tartrate  50 mg Oral BID  . timolol  1 drop Left Eye Daily  . vancomycin  125 mg Oral QID    have reviewed scheduled and prn medications.  Physical Exam: General: Not in distress, comfortable Heart:RRR, s1s2 nl Lungs: Clear bilateral, no wheezing Abdomen:soft, Non-tender, non-distended Extremities:No edema Dialysis Access: Left upper  extremity AV graft has good bruit.  Dron Prasad Bhandari 04/07/2018,9:32 AM  LOS: 5 days

## 2018-04-07 NOTE — Evaluation (Signed)
Physical Therapy Evaluation Patient Details Name: [name] MRN: [id number] DOB: 11/26/1940 Today's Date: 04/07/2018   History of Present Illness  Pt adm with vfib arrest with out of hospital CPR x 12 minutes. Intubated 9/19-9/21. Pt also +Cdiff. PMH - ESRD with recent initiation of HD, cabg, ckd, dm, mi, lt knee surgery, neck surgery, back surgery, afib, CEA, CVA, multiple falls.   Clinical Impression  Pt admitted with above diagnosis and presents to PT with functional limitations due to deficits listed below (See PT problem list). Pt needs skilled PT to maximize independence and safety. Recommend CIR for continued therapy prior to return home.      Follow Up Recommendations CIR;Supervision/Assistance - 24 hour    Equipment Recommendations  None recommended by PT    Recommendations for Other Services       Precautions / Restrictions Precautions Precautions: Fall Restrictions Weight Bearing Restrictions: No      Mobility  Bed Mobility Overal bed mobility: Needs Assistance Bed Mobility: Supine to Sit     Supine to sit: Mod assist     General bed mobility comments: Assist to move legs off of bed, elevate trunk into sitting and bring hips off EOB.   Transfers Overall transfer level: Needs assistance Equipment used: Rolling walker (2 wheeled) Transfers: Sit to/from [facility] Sit to Stand: +2 physical assistance;Mod assist Stand pivot transfers: +2 physical assistance;Min assist       General transfer comment: Assist to bring hips up from bed and for balance. Used walker for pivotal steps bed to chair.  Ambulation/Gait             General Gait Details: Pivotal steps to from bed to chair  Stairs            Wheelchair Mobility    Modified Rankin (Stroke Patients Only)       Balance Overall balance assessment: Needs assistance Sitting-balance support: Feet supported;Bilateral upper extremity supported Sitting balance-Leahy  Scale: Poor Sitting balance - Comments: UE support   Standing balance support: Bilateral upper extremity supported Standing balance-Leahy Scale: Poor Standing balance comment: walker and +2 min assist for static standing                             Pertinent Vitals/Pain Pain Assessment: Faces Faces Pain Scale: Hurts little more Pain Location: chest with coughing Pain Descriptors / Indicators: Grimacing;Guarding Pain Intervention(s): Limited activity within patient's tolerance    Home Living Family/patient expects to be discharged to:: Private residence Living Arrangements: Spouse/significant other Available Help at Discharge: Family;Available 24 hours/day Type of Home: House Home Access: Level entry     Home Layout: Two level;Able to live on main level with bedroom/bathroom Home Equipment: Gilford Rile - 2 wheels;Cane - single point;Bedside commode;Shower seat;Grab bars - toilet Additional Comments: Daughter lives in house behind pt    Prior Function Level of Independence: Independent with assistive device(s)         Comments: Uses rolling walker or cane. Frequent falls. Shares the housework with her husband.     Hand Dominance   Dominant Hand: Right    Extremity/Trunk Assessment   Upper Extremity Assessment Upper Extremity Assessment: Defer to OT evaluation    Lower Extremity Assessment Lower Extremity Assessment: Generalized weakness       Communication   Communication: HOH  Cognition Arousal/Alertness: Awake/alert Behavior During Therapy: WFL for tasks assessed/performed Overall Cognitive Status: Impaired/Different from baseline Area of Impairment: Memory  Memory: Decreased short-term memory                General Comments      Exercises     Assessment/Plan    PT Assessment Patient needs continued PT services  PT Problem List Decreased strength;Decreased activity tolerance;Decreased balance;Decreased  mobility;Decreased cognition;Obesity       PT Treatment Interventions DME instruction;Gait training;Functional mobility training;Therapeutic activities;Therapeutic exercise;Balance training;Patient/family education    PT Goals (Current goals can be found in the Care Plan section)  Acute Rehab PT Goals Patient Stated Goal: return home PT Goal Formulation: With patient Time For Goal Achievement: 04/21/18 Potential to Achieve Goals: Fair    Frequency Min 3X/week   Barriers to discharge        Co-evaluation               AM-PAC PT "6 Clicks" Daily Activity  Outcome Measure Difficulty turning over in bed (including adjusting bedclothes, sheets and blankets)?: Unable Difficulty moving from lying on back to sitting on the side of the bed? : Unable Difficulty sitting down on and standing up from a chair with arms (e.g., wheelchair, bedside commode, etc,.)?: Unable Help needed moving to and from a bed to chair (including a wheelchair)?: A Lot Help needed walking in hospital room?: Total Help needed climbing 3-5 steps with a railing? : Total 6 Click Score: 7    End of Session Equipment Utilized During Treatment: Gait belt Activity Tolerance: Patient limited by fatigue Patient left: in chair;with call bell/phone within reach;with chair alarm set;with family/visitor present Nurse Communication: Mobility status PT Visit Diagnosis: Unsteadiness on feet (R26.81);Other abnormalities of gait and mobility (R26.89);Repeated falls (R29.6);Muscle weakness (generalized) (M62.81)    Time: 6333-5456 PT Time Calculation (min) (ACUTE ONLY): 26 min   Charges:   PT Evaluation $PT Eval Moderate Complexity: 1 Mod PT Treatments $Therapeutic Activity: 8-22 mins        Jan [place] Pager [phone] Office [place] 04/07/2018, 5:17 PM

## 2018-04-07 NOTE — Progress Notes (Addendum)
Progress Note  Patient Name: [name] Date of Encounter: 04/07/2018  Primary Cardiologist: Larae Grooms, MD   Subjective   Still with some confusion today. Does not recall Dr. Marlou Porch speaking with her about plans for [facility] tomorrow. No other complaints at this time.   Inpatient Medications    Scheduled Meds: . aspirin  81 mg Oral Daily  . atorvastatin  40 mg Oral q1800  . B-complex with vitamin C  1 tablet Per Tube Daily  . brimonidine  1 drop Left Eye TID  . Chlorhexidine Gluconate Cloth  6 each Topical Q0600  . Chlorhexidine Gluconate Cloth  6 each Topical Q0600  . enoxaparin (LOVENOX) injection  30 mg Subcutaneous Q24H  . feeding supplement  1 Container Oral TID BM  . hydrALAZINE  25 mg Oral Q8H  . insulin aspart  2-6 Units Subcutaneous TID WC  . insulin glargine  15 Units Subcutaneous Daily  . latanoprost  1 drop Both Eyes QHS  . mouth rinse  15 mL Mouth Rinse BID  . metoprolol tartrate  50 mg Oral BID  . timolol  1 drop Left Eye Daily  . vancomycin  125 mg Oral QID   Continuous Infusions: . sodium chloride 10 mL/hr at 04/06/18 2355  . sodium chloride    . ferric gluconate (FERRLECIT/NULECIT) IV    . piperacillin-tazobactam (ZOSYN)  IV 3.375 g (04/06/18 2357)   PRN Meds: sodium chloride, acetaminophen, alum & mag hydroxide-simeth, bisacodyl, calcium carbonate, docusate, Gerhardt's butt cream, hydrALAZINE   Vital Signs    Vitals:   04/07/18 1100 04/07/18 1130 04/07/18 1145 04/07/18 1244  BP: (!) 154/66 (!) 130/53 138/63 136/61  Pulse: (!) 58 80 76 80  Resp:   17   Temp:   98.9 F (37.2 C)   TempSrc:   Oral   SpO2:   100%   Weight:   81.7 kg   Height:        Intake/Output Summary (Last 24 hours) at 04/07/2018 1310 Last data filed at 04/07/2018 1130 Gross per 24 hour  Intake 240 ml  Output 2000 ml  Net -1760 ml   Filed Weights   04/07/18 0551 04/07/18 0720 04/07/18 1145  Weight: 83.6 kg 83.6 kg 81.7 kg    Telemetry    Sinus rhythm with  PVCs - Personally Reviewed  Physical Exam   GEN: thin elderly lady laying in bed in no acute distress.   Neck: No JVD, no carotid bruits Cardiac: RRR, no murmurs, rubs, or gallops.  Respiratory: Clear to auscultation bilaterally, no wheezes/ rales/ rhonchi GI: NABS, Soft, nontender, non-distended  MS: No edema; No deformity. Neuro:  Nonfocal, moving all extremities spontaneously Psych: Normal affect   Labs    Chemistry Recent Labs  Lab 04/02/18 2043 04/03/18 0605 04/04/18 0239 04/07/18 0535  NA 140 140 140 137  K 3.8 3.8 3.9 4.2  CL 100 102 102 93*  CO2 25 23 24 25   GLUCOSE 269* 203* 206* 83  BUN 18 31* 51* 67*  CREATININE 1.91* 2.42* 3.90* 5.93*  CALCIUM 8.6* 8.4* 8.6* 9.4  PROT 6.7  --  5.3*  --   ALBUMIN 3.1*  --  2.2*  --   AST 196*  --  45*  --   ALT 128*  --  53*  --   ALKPHOS 91  --  58  --   BILITOT 0.7  --  0.6  --   GFRNONAA 24* 18* 10* 6*  GFRAA 28* 21*  12* 7*  ANIONGAP 15 15 14  19*     Hematology Recent Labs  Lab 04/05/18 0229 04/06/18 0232 04/07/18 0535  WBC 11.0* 10.3 9.1  RBC 2.53* 2.61* 2.76*  HGB 8.5* 8.8* 9.4*  HCT 27.6* 28.1* 29.3*  MCV 109.1* 107.7* 106.2*  MCH 33.6 33.7 34.1*  MCHC 30.8 31.3 32.1  RDW 14.9 14.6 14.4  PLT 143* 155 166    Cardiac Enzymes Recent Labs  Lab 04/03/18 0054 04/03/18 0605  TROPONINI 1.17* 1.14*    Recent Labs  Lab 04/02/18 1916  TROPIPOC 0.20*     BNPNo results for input(s): BNP, PROBNP in the last 168 hours.   DDimer No results for input(s): DDIMER in the last 168 hours.   Radiology    No results found.  Cardiac Studies   Echocardiogram 03/2018: ------------------------------------------------------------------- Study Conclusions  - Left ventricle: The cavity size was normal. Wall thickness was   increased in a pattern of mild LVH. Systolic function was mildly   reduced. The estimated ejection fraction was in the range of 45%   to 50%. There is akinesis of the basal-midinferolateral  and   inferior myocardium. Doppler parameters are consistent with   abnormal left ventricular relaxation (grade 1 diastolic   dysfunction). - Mitral valve: Calcified annulus. There was mild regurgitation.  Impressions:  - Akinesis of the basal/mid inferior and inferolateral walls with   overall mildly reduced LV systolic function; mild diastolic   dysfunction; mild LVH; mild MR.  Patient Profile     77 y.o. female with VF arrest, prolonged QT, CAD s/p post CABG, DM type 2, end-stage renal disease, hospital course complicated by suspected aspiration pneumonia and confusion  Assessment & Plan    1. VF arrest: patient presented with VF arrest requiring CR with ROSC achieved prior to arrival to the ER. Echo with EF 45-50% and inferolateral wall motion abnormalities. Hospitalization complicated by confusion which is improving. Will plan for ischemic evaluation tomorrow.  - NPO after MN for LHC tomorrow - tentatively planned for 2:30pm so will need to coordinate HD   2. Prolonged QT: Celexa discontinued. Planned for ischemic evaluation tomorrow - Continue to avoid QT prolonging medications.   3. HTN: BP overall stable after hydralazine increased yesterday - Continue hydralazine and metoprolol   4. HLD: LDL 81 - Continue atorvastatin  5. Suspected aspiration PNA: s/p 5 day course of zosyn  - Continue management per primary team  6. Cdiff: on po vancomycin day 5 of 10 - Continue management per primary team  7. ESRD on HD: Nephrology following. Planning for HD tomorrow - Continue management per Nephrology and primary team     For questions or updates, please contact [place] Please consult [email or web address] for contact info under Cardiology/STEMI.      Signed, Abigail Butts, PA-C  04/07/2018, 1:10 PM   [phone]  Personally seen and examined. Agree with above.  Confusion has improved day by day.  No CP, no SOB. Family at bedside. RRR, CTAB, soft abd, no edema.  MAE, non focal. Trouble with short term memory  Labs personally reviewed.   A/P:  Cardiac arrest  - plan LHC tomorrow. Risks and benefits explained to family and patient, including CVA, MI, death. Wanting to proceed.   See above for other details reviewed.   Candee Furbish, MD

## 2018-04-08 ENCOUNTER — Inpatient Hospital Stay (HOSPITAL_COMMUNITY)
Admission: EM | Disposition: A | Discharge status: Rehabilitation Facility | Payer: Self-pay | Source: Home / Self Care | Attending: Critical Care Medicine

## 2018-04-08 DIAGNOSIS — I4581 Long QT syndrome: Secondary | ICD-10-CM

## 2018-04-08 DIAGNOSIS — I25810 Atherosclerosis of coronary artery bypass graft(s) without angina pectoris: Secondary | ICD-10-CM

## 2018-04-08 HISTORY — PX: LEFT HEART CATH AND CORS/GRAFTS ANGIOGRAPHY: CATH118250

## 2018-04-08 HISTORY — PX: CORONARY STENT INTERVENTION: CATH118234

## 2018-04-08 LAB — GLUCOSE, CAPILLARY
GLUCOSE-CAPILLARY: 120 mg/dL — AB (ref 70–99)
GLUCOSE-CAPILLARY: 108 mg/dL — AB (ref 70–99)
Glucose-Capillary: 132 mg/dL — ABNORMAL HIGH (ref 70–99)
Glucose-Capillary: 143 mg/dL — ABNORMAL HIGH (ref 70–99)

## 2018-04-08 LAB — CBC
HCT: 28.3 % — ABNORMAL LOW (ref 36.0–46.0)
Hemoglobin: 8.8 g/dL — ABNORMAL LOW (ref 12.0–15.0)
MCH: 33.5 pg (ref 26.0–34.0)
MCHC: 31.1 g/dL (ref 30.0–36.0)
MCV: 107.6 fL — AB (ref 78.0–100.0)
PLATELETS: 156 10*3/uL (ref 150–400)
RBC: 2.63 MIL/uL — ABNORMAL LOW (ref 3.87–5.11)
RDW: 14.6 % (ref 11.5–15.5)
WBC: 8.9 10*3/uL (ref 4.0–10.5)

## 2018-04-08 LAB — BASIC METABOLIC PANEL
Anion gap: 17 — ABNORMAL HIGH (ref 5–15)
BUN: 32 mg/dL — AB (ref 8–23)
CALCIUM: 8.2 mg/dL — AB (ref 8.9–10.3)
CO2: 25 mmol/L (ref 22–32)
Chloride: 95 mmol/L — ABNORMAL LOW (ref 98–111)
Creatinine, Ser: 3.73 mg/dL — ABNORMAL HIGH (ref 0.44–1.00)
GFR calc Af Amer: 12 mL/min — ABNORMAL LOW (ref >60)
GFR, EST NON AFRICAN AMERICAN: 11 mL/min — AB (ref >60)
Glucose, Bld: 156 mg/dL — ABNORMAL HIGH (ref 70–99)
Potassium: 3.6 mmol/L (ref 3.5–5.1)
Sodium: 137 mmol/L (ref 135–145)

## 2018-04-08 LAB — POCT ACTIVATED CLOTTING TIME
ACTIVATED CLOTTING TIME: 274 s
ACTIVATED CLOTTING TIME: 158 s

## 2018-04-08 SURGERY — LEFT HEART CATH AND CORS/GRAFTS ANGIOGRAPHY
Anesthesia: LOCAL

## 2018-04-08 MED ORDER — CANGRELOR BOLUS VIA INFUSION
INTRAVENOUS | Status: DC | PRN
  Administered 2018-04-08: 1773 ug via INTRAVENOUS

## 2018-04-08 MED ORDER — MIDAZOLAM HCL 2 MG/2ML IJ SOLN
INTRAMUSCULAR | Status: AC
  Filled 2018-04-08: qty 2

## 2018-04-08 MED ORDER — LIDOCAINE HCL (PF) 1 % IJ SOLN
INTRAMUSCULAR | Status: DC | PRN
  Administered 2018-04-08: 15 mL

## 2018-04-08 MED ORDER — SODIUM CHLORIDE 0.9% FLUSH
3.0000 mL | Freq: Two times a day (BID) | INTRAVENOUS | Status: DC
  Administered 2018-04-08 – 2018-04-09 (×2): 3 mL via INTRAVENOUS

## 2018-04-08 MED ORDER — TICAGRELOR 90 MG PO TABS
ORAL_TABLET | ORAL | Status: DC | PRN
  Administered 2018-04-08: 180 mg via ORAL

## 2018-04-08 MED ORDER — ASPIRIN 81 MG PO CHEW: 81.0000 mg | CHEWABLE_TABLET | Freq: Every day | ORAL | Status: DC

## 2018-04-08 MED ORDER — FENTANYL CITRATE (PF) 100 MCG/2ML IJ SOLN
INTRAMUSCULAR | Status: DC | PRN
  Administered 2018-04-08: 12.5 ug via INTRAVENOUS

## 2018-04-08 MED ORDER — HEPARIN SODIUM (PORCINE) 5000 UNIT/ML IJ SOLN
5000.0000 [IU] | Freq: Three times a day (TID) | INTRAMUSCULAR | Status: DC
  Administered 2018-04-09 – 2018-04-15 (×20): 5000 [IU] via SUBCUTANEOUS
  Filled 2018-04-08 (×20): qty 1

## 2018-04-08 MED ORDER — HYDRALAZINE HCL 20 MG/ML IJ SOLN
INTRAMUSCULAR | Status: AC
  Filled 2018-04-08: qty 1

## 2018-04-08 MED ORDER — HEPARIN (PORCINE) IN NACL 1000-0.9 UT/500ML-% IV SOLN
INTRAVENOUS | Status: DC | PRN
  Administered 2018-04-08: 500 mL

## 2018-04-08 MED ORDER — LABETALOL HCL 5 MG/ML IV SOLN: 10.0000 mg | INTRAVENOUS | Status: AC | PRN

## 2018-04-08 MED ORDER — SODIUM CHLORIDE 0.9 % IV SOLN
INTRAVENOUS | Status: AC | PRN
  Administered 2018-04-08: 4 ug/kg/min via INTRAVENOUS

## 2018-04-08 MED ORDER — TICAGRELOR 90 MG PO TABS
90.0000 mg | ORAL_TABLET | Freq: Two times a day (BID) | ORAL | Status: DC
  Administered 2018-04-09 – 2018-04-15 (×13): 90 mg via ORAL
  Filled 2018-04-08 (×13): qty 1

## 2018-04-08 MED ORDER — CHLORHEXIDINE GLUCONATE CLOTH 2 % EX PADS: 6.0000 | MEDICATED_PAD | Freq: Every day | CUTANEOUS | Status: DC

## 2018-04-08 MED ORDER — HEART ATTACK BOUNCING BOOK
Freq: Once | Status: AC
  Administered 2018-04-08: 22:00:00 1
  Filled 2018-04-08: qty 1

## 2018-04-08 MED ORDER — SODIUM CHLORIDE 0.9 % IV SOLN: 250.0000 mL | INTRAVENOUS | Status: DC | PRN

## 2018-04-08 MED ORDER — SODIUM CHLORIDE 0.9% FLUSH: 3.0000 mL | INTRAVENOUS | Status: DC | PRN

## 2018-04-08 MED ORDER — FENTANYL CITRATE (PF) 100 MCG/2ML IJ SOLN
INTRAMUSCULAR | Status: AC
  Filled 2018-04-08: qty 2

## 2018-04-08 MED ORDER — HEPARIN (PORCINE) IN NACL 1000-0.9 UT/500ML-% IV SOLN
INTRAVENOUS | Status: AC
  Filled 2018-04-08: qty 500

## 2018-04-08 MED ORDER — HEPARIN SODIUM (PORCINE) 1000 UNIT/ML IJ SOLN
INTRAMUSCULAR | Status: DC | PRN
  Administered 2018-04-08: 2000 [IU] via INTRAVENOUS
  Administered 2018-04-08: 7000 [IU] via INTRAVENOUS

## 2018-04-08 MED ORDER — MIDAZOLAM HCL 2 MG/2ML IJ SOLN
INTRAMUSCULAR | Status: DC | PRN
  Administered 2018-04-08: 0.5 mg via INTRAVENOUS

## 2018-04-08 MED ORDER — CANGRELOR TETRASODIUM 50 MG IV SOLR
INTRAVENOUS | Status: AC
  Filled 2018-04-08: qty 50

## 2018-04-08 MED ORDER — HYDRALAZINE HCL 20 MG/ML IJ SOLN
INTRAMUSCULAR | Status: DC | PRN
  Administered 2018-04-08: 10 mg via INTRAVENOUS

## 2018-04-08 MED ORDER — SODIUM CHLORIDE 0.9 % IV SOLN
4.0000 ug/kg/min | INTRAVENOUS | Status: AC
  Filled 2018-04-08: qty 50

## 2018-04-08 MED ORDER — LIDOCAINE HCL (PF) 1 % IJ SOLN
INTRAMUSCULAR | Status: AC
  Filled 2018-04-08: qty 30

## 2018-04-08 MED ORDER — HYDRALAZINE HCL 20 MG/ML IJ SOLN
10.0000 mg | INTRAMUSCULAR | Status: DC | PRN
  Administered 2018-04-10: 19:00:00 20 mg via INTRAVENOUS
  Filled 2018-04-08 (×2): qty 1

## 2018-04-08 MED ORDER — ACETAMINOPHEN 325 MG PO TABS: 650.0000 mg | ORAL_TABLET | ORAL | Status: DC | PRN

## 2018-04-08 MED ORDER — HEPARIN SODIUM (PORCINE) 1000 UNIT/ML IJ SOLN
INTRAMUSCULAR | Status: AC
  Filled 2018-04-08: qty 1

## 2018-04-08 MED ORDER — ANGIOPLASTY BOOK
Freq: Once | Status: AC
  Administered 2018-04-08: 22:00:00 1
  Filled 2018-04-08: qty 1

## 2018-04-08 MED ORDER — HYDRALAZINE HCL 20 MG/ML IJ SOLN
5.0000 mg | INTRAMUSCULAR | Status: AC | PRN
  Administered 2018-04-08: 5 mg via INTRAVENOUS

## 2018-04-08 MED ORDER — ONDANSETRON HCL 4 MG/2ML IJ SOLN
4.0000 mg | Freq: Four times a day (QID) | INTRAMUSCULAR | Status: DC | PRN
  Filled 2018-04-08: qty 2

## 2018-04-08 SURGICAL SUPPLY — 23 items
BALLN EMERGE MR 2.0X8 (BALLOONS) ×2
BALLOON EMERGE MR 2.0X8 (BALLOONS) ×1 IMPLANT
CATH INFINITI 5 FR 3DRC (CATHETERS) ×2 IMPLANT
CATH INFINITI 5 FR IM (CATHETERS) ×2 IMPLANT
CATH INFINITI 5FR MULTPACK ANG (CATHETERS) ×2 IMPLANT
CATH VISTA GUIDE 6FR JR4 (CATHETERS) ×2 IMPLANT
KIT ENCORE 26 ADVANTAGE (KITS) ×2 IMPLANT
KIT HEART LEFT (KITS) ×2 IMPLANT
KIT HEMO VALVE WATCHDOG (MISCELLANEOUS) ×2 IMPLANT
PACK CARDIAC CATHETERIZATION (CUSTOM PROCEDURE TRAY) ×2 IMPLANT
SHEATH PINNACLE 5F 10CM (SHEATH) ×2 IMPLANT
SHEATH PINNACLE 6F 10CM (SHEATH) ×2 IMPLANT
SHEATH PROBE COVER 6X72 (BAG) ×2 IMPLANT
STENT RESOLUTE ONYX 2.0X15 (Permanent Stent) ×2 IMPLANT
TRANSDUCER W/STOPCOCK (MISCELLANEOUS) ×2 IMPLANT
TUBING CIL FLEX 10 FLL-RA (TUBING) ×2 IMPLANT
WIRE ASAHI PROWATER 180CM (WIRE) ×2 IMPLANT
WIRE EMERALD 3MM-J .035X150CM (WIRE) ×2 IMPLANT
WIRE EMERALD 3MM-J .035X260CM (WIRE) ×2 IMPLANT
WIRE EMERALD ST .035X150CM (WIRE) IMPLANT
WIRE FIGHTER CROSSING 190CM (WIRE) ×2 IMPLANT
WIRE HI TORQ BMW 190CM (WIRE) ×2 IMPLANT
WIRE HI TORQ VERSACORE-J 145CM (WIRE) ×2 IMPLANT

## 2018-04-08 NOTE — Clinical Social Work Note (Signed)
Clinical Social Work Assessment  Patient Details  Name: [name] MRN: [id number] Date of Birth: Oct 13, 1940  Date of referral:  04/08/18               Reason for consult:  Facility Placement, Discharge Planning                Permission sought to share information with:  Facility Sport and exercise psychologist, Family Supports Permission granted to share information::  Yes, Verbal Permission Granted  Name::     [place]::  SNFs  Relationship::  daughter  Contact Information:  [phone]  Housing/Transportation Living arrangements for the past 2 months:  Single Family Home Source of Information:  Patient, Adult Children, Spouse Patient Interpreter Needed:  None Criminal Activity/Legal Involvement Pertinent to Current Situation/Hospitalization:  No - Comment as needed Significant Relationships:  Adult Children, Spouse Lives with:  Spouse Do you feel safe going back to the place where you live?  Yes Need for family participation in patient care:  Yes (Comment)  Care giving concerns: Patient from home with spouse. Patient receives HD TTS. PT recommending CIR. CSW consulted for SNF backup to CIR.   Social Worker assessment / plan: CSW met with patient, spouse, and daughter at bedside. Patient alert and oriented, though appeared lethargic. CSW introduced self and role and discussed disposition planning - recommendation for CIR. Patient's family indicated a strong preference for CIR. Family is agreeable to SNF as a backup, daughter requesting list of SNFs.  Patient's family takes patient to dialysis in [place] and they prefer a SNF in [place] if CIR cannot take patient. Patient lives in [place], but family does not want a SNF in that area.    CSW to send out initial referrals and will provide bed offers when available. CSW to follow for patient's medical readiness and CIR consult. Will support with disposition planning.  Employment status:  Retired Forensic scientist:   Medicare PT Recommendations:  Inpatient [place] / Referral to community resources:  [place]  Patient/Family's Response to care: Patient and family appreciative of care.  Patient/Family's Understanding of and Emotional Response to Diagnosis, Current Treatment, and Prognosis: Patient's family with understanding of condition and hopeful for CIR, but agreeable to SNF as backup.  Emotional Assessment Appearance:  Appears stated age Attitude/Demeanor/Rapport:  Engaged Affect (typically observed):  Accepting, Appropriate Orientation:  Oriented to Self, Oriented to Place, Oriented to  Time Alcohol / Substance use:  Not Applicable Psych involvement (Current and /or in the community):  No (Comment)  Discharge Needs  Concerns to be addressed:  Care Coordination, Discharge Planning Concerns Readmission within the last 30 days:  No Current discharge risk:  Physical Impairment Barriers to Discharge:  Continued Medical Work up   Estanislado Emms, LCSW 04/08/2018, 11:40 AM

## 2018-04-08 NOTE — NC FL2 (Signed)
[place] LEVEL OF CARE SCREENING TOOL     IDENTIFICATION  Patient Name: [name] Birthdate: [birth year] Sex: female Admission Date (Current Location): 04/02/2018  [facility] and [place] Number:  Herbalist and Address:  [facility], [place] [address] [place], [place] [address]      Provider Number: [id number]  Attending Physician Name and Address:  Edwin Dada, *  Relative Name and Phone Number:  Magda Paganini, daughter, [phone]    Current Level of Care: Hospital Recommended Level of Care: [place] Prior Approval Number:    Date Approved/Denied:   PASRR Number: [id number]  Discharge Plan: SNF    Current Diagnoses: Patient Active Problem List   Diagnosis Date Noted  . Aspiration into airway   . NSTEMI (non-ST elevated myocardial infarction) ([place])   . QT prolongation 04/03/2018  . Acute respiratory failure with hypoxia ([place])   . Aspiration pneumonia due to gastric secretions ([place])   . Cardiac arrest ([place]) 04/02/2018  . Anxiety 05/13/2017  . Panic attack 05/13/2017  . SAH (subarachnoid hemorrhage) ([place]) 02/03/2017  . Fall 02/03/2017  . Spinal stenosis, lumbar region 03/02/2016  . Trochanteric bursitis of left hip 01/03/2016  . Generalized weakness 12/21/2015  . Acute hypokalemia 12/21/2015  . Lumbar back pain with radiculopathy affecting left lower extremity 12/04/2015  . Coronary artery disease involving native coronary artery of native heart without angina pectoris   . CRI (chronic renal insufficiency), stage 4 (severe) (HCC)   . Urinary retention   . Anemia of chronic kidney failure   . Hx of gout   . Thrombocytopenia ([place])   . S/P lumbar discectomy 12/01/2015  . 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 Acute encephalopathy   . Chronic kidney disease (CKD), stage V ([place]) 07/13/2015  . Glaucoma   .  Atrial fibrillation ([place])   . CAD (coronary artery disease)   . Depression   . Stroke ([place])   . Diabetes mellitus without complication ([place])   . Gastroesophageal reflux disease with esophagitis   . Carotid artery stenosis 04/21/2015  . Essential hypertension 06/08/2014  . Hyperlipidemia 06/08/2014  . Occlusion and stenosis of carotid artery without mention of cerebral infarction 01/01/2012    Orientation RESPIRATION BLADDER Height & Weight     Self, Time, Place  Normal Continent Weight: 130 lb 4.8 oz (59.1 kg) Height:  5\' 4"  (162.6 cm)  BEHAVIORAL SYMPTOMS/MOOD NEUROLOGICAL BOWEL NUTRITION STATUS      Incontinent Diet(please see DC summary)  AMBULATORY STATUS COMMUNICATION OF NEEDS Skin   Extensive Assist Verbally Normal                       Personal Care Assistance Level of Assistance  Bathing, Feeding, Dressing Bathing Assistance: Limited assistance Feeding assistance: Independent Dressing Assistance: Limited assistance     Functional Limitations Info  Sight, Hearing, Speech Sight Info: Adequate Hearing Info: Impaired Speech Info: Adequate    SPECIAL CARE FACTORS FREQUENCY  PT (By licensed PT), OT (By licensed OT)     PT Frequency: 5x/week OT Frequency: 5x/week            Contractures Contractures Info: Not present    Additional Factors Info  Code Status, Allergies, Insulin Sliding Scale Code Status Info: Full Allergies Info: Gabapentin, Sulfa Antibiotics, Amlodipine   Insulin Sliding Scale Info: novolog 3x/day with meals, lantus daily  Current Medications (04/08/2018):  This is the current hospital active medication list Current Facility-Administered Medications  Medication Dose Route Frequency Provider Last Rate Last Dose  . 0.9 %  sodium chloride infusion   Intravenous Continuous Marijean Heath, NP 10 mL/hr at 04/06/18 2355    . 0.9 %  sodium chloride infusion  250 mL Intravenous PRN Darlina Sicilian A, NP      . 0.9 %  sodium  chloride infusion  250 mL Intravenous PRN Kroeger, Krista M., PA-C      . 0.9 %  sodium chloride infusion   Intravenous Continuous Kroeger, Krista M., PA-C      . acetaminophen (TYLENOL) tablet 650 mg  650 mg Oral Q6H PRN Marijean Heath, NP   650 mg at 04/06/18 5465  . alum & mag hydroxide-simeth (MAALOX/MYLANTA) 200-200-20 MG/5ML suspension 15 mL  15 mL Oral Q6H PRN Rigoberto Noel, MD      . aspirin chewable tablet 81 mg  81 mg Oral Daily Whiteheart, Kathryn A, NP   81 mg at 04/08/18 0654  . atorvastatin (LIPITOR) tablet 40 mg  40 mg Oral q1800 Darlina Sicilian A, NP   40 mg at 04/07/18 1730  . B-complex with vitamin C tablet 1 tablet  1 tablet Per Tube Daily Whiteheart, Cristal Ford, NP   1 tablet at 04/08/18 0935  . bisacodyl (DULCOLAX) suppository 10 mg  10 mg Rectal Daily PRN Whiteheart, Kathryn A, NP      . brimonidine (ALPHAGAN) 0.15 % ophthalmic solution 1 drop  1 drop Left Eye TID Marijean Heath, NP   1 drop at 04/08/18 0936  . calcium carbonate (TUMS - dosed in mg elemental calcium) chewable tablet 200 mg of elemental calcium  1 tablet Oral Q6H PRN Rigoberto Noel, MD   200 mg of elemental calcium at 04/06/18 1743  . Chlorhexidine Gluconate Cloth 2 % PADS 6 each  6 each Topical Q0600 Marijean Heath, NP   6 each at 04/07/18 0548  . Chlorhexidine Gluconate Cloth 2 % PADS 6 each  6 each Topical Q0600 Marijean Heath, NP   6 each at 04/07/18 0547  . Chlorhexidine Gluconate Cloth 2 % PADS 6 each  6 each Topical Q0600 Rosita Fire, MD      . docusate (COLACE) 50 MG/5ML liquid 100 mg  100 mg Per Tube BID PRN Darlina Sicilian A, NP      . enoxaparin (LOVENOX) injection 30 mg  30 mg Subcutaneous Q24H Whiteheart, Kathryn A, NP   30 mg at 04/08/18 0933  . feeding supplement (BOOST / RESOURCE BREEZE) liquid 1 Container  1 Container Oral TID BM Marijean Heath, NP   1 Container at 04/07/18 1943  . ferric gluconate (NULECIT) 125 mg in sodium chloride 0.9 % 100  mL IVPB  125 mg Intravenous Q Tue-HD Rosita Fire, MD 110 mL/hr at 04/07/18 1945 125 mg at 04/07/18 1945  . Gerhardt's butt cream   Topical PRN Marijean Heath, NP   1 application at 03/54/65 1600  . hydrALAZINE (APRESOLINE) injection 10-20 mg  10-20 mg Intravenous Q4H PRN Darlina Sicilian A, NP   20 mg at 04/06/18 1448  . hydrALAZINE (APRESOLINE) tablet 25 mg  25 mg Oral Q8H Jerline Pain, MD   25 mg at 04/08/18 0654  . insulin aspart (novoLOG) injection 2-6 Units  2-6 Units Subcutaneous TID WC Marijean Heath, NP   6 Units at 04/07/18 1732  . insulin  glargine (LANTUS) injection 15 Units  15 Units Subcutaneous Daily Marijean Heath, NP   15 Units at 04/08/18 0934  . latanoprost (XALATAN) 0.005 % ophthalmic solution 1 drop  1 drop Both Eyes QHS Whiteheart, Kathryn A, NP   1 drop at 04/07/18 2220  . MEDLINE mouth rinse  15 mL Mouth Rinse BID Darlina Sicilian A, NP   15 mL at 04/06/18 2216  . metoprolol tartrate (LOPRESSOR) tablet 50 mg  50 mg Oral BID Jerline Pain, MD   50 mg at 04/08/18 0935  . sodium chloride flush (NS) 0.9 % injection 3 mL  3 mL Intravenous Q12H Kroeger, Krista M., PA-C   3 mL at 04/08/18 0936  . sodium chloride flush (NS) 0.9 % injection 3 mL  3 mL Intravenous PRN Kroeger, Krista M., PA-C      . timolol (TIMOPTIC) 0.5 % ophthalmic solution 1 drop  1 drop Left Eye Daily Whiteheart, Cristal Ford, NP   1 drop at 04/07/18 2220  . vancomycin (VANCOCIN) 50 mg/mL oral solution 125 mg  125 mg Oral QID Edwin Dada, MD   125 mg at 04/08/18 9030     Discharge Medications: Please see discharge summary for a list of discharge medications.  Relevant Imaging Results:  Relevant Lab Results:   Additional Information SSN: [id number]  Estanislado Emms, LCSW

## 2018-04-08 NOTE — Interval H&P Note (Signed)
Cath Lab Visit (complete for each Cath Lab visit)  Clinical Evaluation Leading to the Procedure:   ACS: Yes.    Non-ACS:    Anginal Classification: CCS IV  Anti-ischemic medical therapy: Minimal Therapy (1 class of medications)  Non-Invasive Test Results: No non-invasive testing performed  Prior CABG: Previous CABG  Cardiac arrest.    History and Physical Interval Note:  04/08/2018 2:09 PM  [name]  has presented today for surgery, with the diagnosis of vf arrest  The various methods of treatment have been discussed with the patient and family. After consideration of risks, benefits and other options for treatment, the patient has consented to  Procedure(s): LEFT HEART CATH AND CORS/GRAFTS ANGIOGRAPHY (N/A) as a surgical intervention .  The patient's history has been reviewed, patient examined, no change in status, stable for surgery.  I have reviewed the patient's chart and labs.  Questions were answered to the patient's satisfaction.     Larae Grooms

## 2018-04-08 NOTE — H&P (View-Only) (Signed)
Progress Note  Patient Name: [name] Date of Encounter: 04/08/2018  Primary Cardiologist: Larae Grooms, MD  Subjective   Pt feeling well today. No c/o of chest pain or palpitations. More alert and less confused today.   Inpatient Medications    Scheduled Meds: . aspirin  81 mg Oral Daily  . atorvastatin  40 mg Oral q1800  . B-complex with vitamin C  1 tablet Per Tube Daily  . brimonidine  1 drop Left Eye TID  . Chlorhexidine Gluconate Cloth  6 each Topical Q0600  . Chlorhexidine Gluconate Cloth  6 each Topical Q0600  . enoxaparin (LOVENOX) injection  30 mg Subcutaneous Q24H  . feeding supplement  1 Container Oral TID BM  . hydrALAZINE  25 mg Oral Q8H  . insulin aspart  2-6 Units Subcutaneous TID WC  . insulin glargine  15 Units Subcutaneous Daily  . latanoprost  1 drop Both Eyes QHS  . mouth rinse  15 mL Mouth Rinse BID  . metoprolol tartrate  50 mg Oral BID  . sodium chloride flush  3 mL Intravenous Q12H  . timolol  1 drop Left Eye Daily  . vancomycin  125 mg Oral QID   Continuous Infusions: . sodium chloride 10 mL/hr at 04/06/18 2355  . sodium chloride    . sodium chloride    . sodium chloride    . ferric gluconate (FERRLECIT/NULECIT) IV 125 mg (04/07/18 1945)   PRN Meds: sodium chloride, sodium chloride, acetaminophen, alum & mag hydroxide-simeth, bisacodyl, calcium carbonate, docusate, Gerhardt's butt cream, hydrALAZINE, sodium chloride flush   Vital Signs    Vitals:   04/07/18 1636 04/07/18 1941 04/07/18 2220 04/08/18 0426  BP: (!) 124/43 (!) 120/44 (!) 144/43 (!) 145/67  Pulse: 78  78 70  Resp: 18 20  19   Temp: 99 F (37.2 C) 98.1 F (36.7 C)  98.6 F (37 C)  TempSrc: Oral Oral  Oral  SpO2: 100%   98%  Weight:    59.1 kg  Height:        Intake/Output Summary (Last 24 hours) at 04/08/2018 0744 Last data filed at 04/08/2018 0345 Gross per 24 hour  Intake 744.11 ml  Output 2000 ml  Net -1255.89 ml   Filed Weights   04/07/18 0720  04/07/18 1145 04/08/18 0426  Weight: 83.6 kg 81.7 kg 59.1 kg    Physical Exam   General: Elderly, NAD Skin: Warm, dry, intact  Head: Normocephalic, atraumatic, clear, moist mucus membranes. Neck: Negative for carotid bruits. No JVD Lungs:Clear to ausculation bilaterally. No wheezes, rales, or rhonchi. Breathing is unlabored. Cardiovascular: RRR with S1 S2. No murmurs, rubs, gallops, or LV heave appreciated. Abdomen: Soft, non-tender, non-distended with normoactive bowel sounds. No obvious abdominal masses. MSK: Strength and tone appear normal for age. 5/5 in all extremities Extremities: No edema. No clubbing or cyanosis. DP/PT pulses 2+ bilaterally Neuro: Alert and oriented to person, place and situation. No focal deficits. No facial asymmetry. MAE spontaneously. Psych: Responds to questions appropriately with normal affect.    Labs    Chemistry Recent Labs  Lab 04/02/18 2043  04/04/18 0239 04/07/18 0535 04/08/18 0414  NA 140   < > 140 137 137  K 3.8   < > 3.9 4.2 3.6  CL 100   < > 102 93* 95*  CO2 25   < > 24 25 25   GLUCOSE 269*   < > 206* 83 156*  BUN 18   < > 51*  67* 32*  CREATININE 1.91*   < > 3.90* 5.93* 3.73*  CALCIUM 8.6*   < > 8.6* 9.4 8.2*  PROT 6.7  --  5.3*  --   --   ALBUMIN 3.1*  --  2.2*  --   --   AST 196*  --  45*  --   --   ALT 128*  --  53*  --   --   ALKPHOS 91  --  58  --   --   BILITOT 0.7  --  0.6  --   --   GFRNONAA 24*   < > 10* 6* 11*  GFRAA 28*   < > 12* 7* 12*  ANIONGAP 15   < > 14 19* 17*   < > = values in this interval not displayed.     Hematology Recent Labs  Lab 04/06/18 0232 04/07/18 0535 04/08/18 0414  WBC 10.3 9.1 8.9  RBC 2.61* 2.76* 2.63*  HGB 8.8* 9.4* 8.8*  HCT 28.1* 29.3* 28.3*  MCV 107.7* 106.2* 107.6*  MCH 33.7 34.1* 33.5  MCHC 31.3 32.1 31.1  RDW 14.6 14.4 14.6  PLT 155 166 156    Cardiac Enzymes Recent Labs  Lab 04/03/18 0054 04/03/18 0605  TROPONINI 1.17* 1.14*    Recent Labs  Lab 04/02/18 1916    TROPIPOC 0.20*     BNPNo results for input(s): BNP, PROBNP in the last 168 hours.   DDimer No results for input(s): DDIMER in the last 168 hours.   Radiology    No results found.  Telemetry    09/251/9 NSR with HR in the 70-80's - Personally Reviewed  ECG    No new tracings as of 04/08/2017- Personally Reviewed  Cardiac Studies   Echocardiogram 03/2018: Study Conclusions  - Left ventricle: The cavity size was normal. Wall thickness was increased in a pattern of mild LVH. Systolic function was mildly reduced. The estimated ejection fraction was in the range of 45% to 50%. There is akinesis of the basal-midinferolateral and inferior myocardium. Doppler parameters are consistent with abnormal left ventricular relaxation (grade 1 diastolic dysfunction). - Mitral valve: Calcified annulus. There was mild regurgitation.  Impressions:  - Akinesis of the basal/mid inferior and inferolateral walls with overall mildly reduced LV systolic function; mild diastolic dysfunction; mild LVH; mild MR.  Patient Profile     77 y.o. female with VF arrest, prolonged QT, CAD s/p post CABG, DM type 2, end-stage renal disease, hospital course complicated by suspected aspiration pneumonia and confusion  Assessment & Plan    1.  VF arrest: -Patient presented with VF arrest requiring CPR with ROSC achieved prior to ED arrival -Echocardiogram with decreased LVEF at 40 to 45% with inferolateral wall motion abnormalities -Hospitalization complicated by confusion and aspiration pneumonia -Plan for definitive ischemic evaluation with cardiac catheterization today, 04/08/2017 -Will need to coordinate with HD>>>creatiitine, 3.73 today  -Pt aware and understands  -Further management guided by findings today   2.  Prolonged QT: -Celexa discontinued -Plan for cardiac catheterization today, 04/08/2018 -Pt has been NPO -Continue to avoid QT prolonging medications  3.   HTN: -Stable, 145/67, 144/43, 120/44, 124/43 -Hydralazine increased on 04/06/2018 for elevated BP -Continue current regimen with hydralazine and metoprolol  4.  HLD: -Stable, LDL 81 -Continue atorvastatin  5.  Suspected aspiration pneumonia: -s/p 5-day course of Zosyn -Pending management per primary team  6.  C. Difficile: -On vancomycin, day 6 of 10 -Continue management per primary team  7.  ESRD on HD: -Nephrology following with plans for HD today, 04/08/2018 prior to cath?  -Continue management per nephrology and primary team  Signed, Kathyrn Drown NP-C [facility] Pager: [phone] 04/08/2018, 7:44 AM     For questions or updates, please contact   Please consult [email or web address] for contact info under Cardiology/STEMI.  Personally seen and examined. Agree with above.  77 year old with cardiac arrest.  Neurologically much more alert, less confused.  Short-term memory was more of an issue yesterday. She had hemodialysis yesterday.  I think she is ready for cardiac catheterization today.  Discussed with family. Physical exam unchanged from yesterday other than improved neurologic status, regular rate and rhythm no murmurs, lungs are clear, no edema.  Cardiac arrest -Cardiac catheterization, EF 40 to 45% with inferolateral wall motion abnormalities, question coronary artery disease  Prolonged QT -Continue to avoid Celexa if possible.  Avoid Zofran and other QT prolonging agents.  C. difficile - Per primary team.  End-stage renal disease -Had hemodialysis yesterday.  Candee Furbish, MD

## 2018-04-08 NOTE — Care Management Note (Signed)
Case Management Note  Patient Details  Name: [name] MRN: [id number] Date of Birth: Aug 21, 1940  Subjective/Objective:   S/p stent intervention, will be on brilinta, Nere RN will give patient the 30 day free coupon, copay is 3.40 per benefit check.                 Action/Plan: DC home when ready.  Expected Discharge Date:                  Expected Discharge Plan:  Home/Self Care  In-House Referral:     Discharge planning Services  CM Consult, Medication Assistance  Post Acute Care Choice:    Choice offered to:     DME Arranged:    DME Agency:     HH Arranged:    HH Agency:     Status of Service:  Completed, signed off  If discussed at [employer] of Stay Meetings, dates discussed:    Additional Comments:  Zenon Mayo, RN 04/08/2018, 5:35 PM

## 2018-04-08 NOTE — Progress Notes (Signed)
Site area: right groin  Site Prior to Removal:  Level 0  Pressure Applied For 15 MINUTES    Minutes Beginning at 21:08  Manual:   Yes.    Patient Status During Pull:  WNL  Post Pull Groin Site:  Level 0  Post Pull Instructions Given:  Yes.    Post Pull Pulses Present:  Yes.    Dressing Applied:  Yes.    Comments: Pt tolerated well.

## 2018-04-08 NOTE — Care Management (Signed)
04-08-18 BENEFITS CHECK :   5.  S/W JEROME  @ [employer] / [employer] # [phone]   BRILINTA  90 MG BID COVER- YES CO-PAY- $ 3.40  OR  5 % OF TOTAL COST TIER- 3 DRUG PRIOR APPROVAL- NO  PREFERRED PHARMACY : YES [facility], [facility] AND [facility]

## 2018-04-08 NOTE — Progress Notes (Signed)
Physical Therapy Treatment Patient Details Name: [name] MRN: [id number] DOB: 1940-10-14 Today's Date: 04/08/2018    History of Present Illness Pt adm with vfib arrest with out of hospital CPR x 12 minutes. Intubated 9/19-9/21. Pt also +Cdiff. PMH - ESRD with recent initiation of HD, cabg, ckd, dm, mi, lt knee surgery, neck surgery, back surgery, afib, CEA, CVA, multiple falls.     PT Comments    Pt making good progress with mobility. Continue to recommend CIR for further therapy.    Follow Up Recommendations  CIR;Supervision/Assistance - 24 hour     Equipment Recommendations  None recommended by PT    Recommendations for Other Services       Precautions / Restrictions Precautions Precautions: Fall Restrictions Weight Bearing Restrictions: No    Mobility  Bed Mobility Overal bed mobility: Needs Assistance Bed Mobility: Rolling;Sidelying to Sit Rolling: Min assist Sidelying to sit: Min assist Supine to sit: Mod assist     General bed mobility comments: Assist to move legs off of bed, elevate trunk into sitting, and bring hips to EOB  Transfers Overall transfer level: Needs assistance Equipment used: Rolling walker (2 wheeled) Transfers: Sit to/from Stand Sit to Stand: +2 physical assistance;Min assist Stand pivot transfers: +2 physical assistance;Min assist       General transfer comment: Assist to bring hips up and for balance. Verbal cues for hand placement  Ambulation/Gait Ambulation/Gait assistance: Min assist Gait Distance (Feet): 10 Feet(10' x 1, 5' x1) Assistive device: Rolling walker (2 wheeled) Gait Pattern/deviations: Step-through pattern;Decreased step length - right;Decreased step length - left;Trunk flexed Gait velocity: decr Gait velocity interpretation: <1.8 ft/sec, indicate of risk for recurrent falls General Gait Details: Assist for balance and support.    Stairs             Wheelchair Mobility    Modified Rankin (Stroke  Patients Only)       Balance Overall balance assessment: Needs assistance Sitting-balance support: Feet supported;No upper extremity supported Sitting balance-Leahy Scale: Fair Sitting balance - Comments: UE support   Standing balance support: Bilateral upper extremity supported Standing balance-Leahy Scale: Poor Standing balance comment: walker and min assist for static standing                            Cognition Arousal/Alertness: Awake/alert Behavior During Therapy: WFL for tasks assessed/performed Overall Cognitive Status: Impaired/Different from baseline Area of Impairment: Memory                     Memory: Decreased short-term memory                Exercises      General Comments        Pertinent Vitals/Pain Pain Assessment: Faces Faces Pain Scale: Hurts little more Pain Location: chest with coughing Pain Descriptors / Indicators: Grimacing;Guarding Pain Intervention(s): Limited activity within patient's tolerance;Monitored during session;Repositioned;Other (comment)(used pillow for splinting)    Home Living Family/patient expects to be discharged to:: (P) Private residence Living Arrangements: (P) Spouse/significant other Available Help at Discharge: (P) Family;Available 24 hours/day Type of Home: (P) House Home Access: (P) Level entry   Home Layout: (P) Two level;Able to live on main level with bedroom/bathroom Home Equipment: (P) Walker - 2 wheels;Cane - single point;Bedside commode;Shower seat;Grab bars - toilet Additional Comments: (P) Daughter lives in house behind pt    Prior Function Level of Independence: (P) Independent with assistive device(s)  Comments: (P) Uses rolling walker or cane. Frequent falls. Shares the housework with her husband.   PT Goals (current goals can now be found in the care plan section) Acute Rehab PT Goals Patient Stated Goal: return home Progress towards PT goals: Progressing toward  goals    Frequency    Min 3X/week      PT Plan Current plan remains appropriate    Co-evaluation PT/OT/SLP Co-Evaluation/Treatment: Yes Reason for Co-Treatment: For patient/therapist safety PT goals addressed during session: Mobility/safety with mobility OT goals addressed during session: ADL's and self-care      AM-PAC PT "6 Clicks" Daily Activity  Outcome Measure  Difficulty turning over in bed (including adjusting bedclothes, sheets and blankets)?: Unable Difficulty moving from lying on back to sitting on the side of the bed? : Unable Difficulty sitting down on and standing up from a chair with arms (e.g., wheelchair, bedside commode, etc,.)?: Unable Help needed moving to and from a bed to chair (including a wheelchair)?: A Lot Help needed walking in hospital room?: A Little Help needed climbing 3-5 steps with a railing? : Total 6 Click Score: 9    End of Session Equipment Utilized During Treatment: Gait belt Activity Tolerance: Patient tolerated treatment well Patient left: in chair;with call bell/phone within reach;with chair alarm set;with family/visitor present Nurse Communication: Mobility status PT Visit Diagnosis: Unsteadiness on feet (R26.81);Other abnormalities of gait and mobility (R26.89);Repeated falls (R29.6);Muscle weakness (generalized) (M62.81)     Time: 3668-1594 PT Time Calculation (min) (ACUTE ONLY): 28 min  Charges:  $Gait Training: 8-22 mins                     [place] Pager [phone] Office [place] 04/08/2018, 12:58 PM

## 2018-04-08 NOTE — Progress Notes (Signed)
PROGRESS NOTE    [name]  KWI:[id number] DOB: 1940-12-23 DOA: 04/02/2018 PCP: Josetta Huddle, MD      Brief Narrative:  [name] is a 77 y.o. F with ESRD on HD TThS, coronary disease, HTN, and IDDM who presented with VF arrest.  Not cooled because was able to interact immediately after resuscitation.  Also diarrhea on admission, Cdiff antigen positive and suspected aspiration pneumonia.     Assessment & Plan:  VF arrest 12 minutes OOH CPR.  ROSC by time of arrival to ER.  Not cooled due to ability to interact on arrival to hospital.  Echo now with reduced EF, RWMA.  Got 48 hours heparin gtt. -Consult to cardiology, appreciate expert cares -Continue aspirin, atorvastatin     C. difficile diarrhea Profuse foul-smelling diarrhea at admission.  C. difficile antigen positive, toxin negative. -Continue oral vancomycin, day 6 of 10  Aspiration pneumonia Completed 5 days of Zosyn.  WBC resolved.  ESRD on HD -Consult nephrology, appreciate cares  Diabetes Glucose well controlled -Continue glargine and sliding scale corrections  Anemia of chronic renal insufficiency Hemoglobin stable  Thrombocytopenia Resolved  Prolonged QTc -Continue telemetry -Avoid QT prolonging agents       DVT prophylaxis: Lovenox Code Status: FULL Family Communication: None present MDM and disposition Plan: The below labs and imaging reports were reviewed and summarized above.  Medication management as above.  The patient was admitted with a ventricular fibrillation cardiac arrest.  She did not require cooling.  Her echo shows no regional wall motion abnormality, and etiology of pending angiography today.  Once angiogram complete, will progress towards rehab placement.    Consultants:   Cardiology  Nephrology  CCM  Procedures:   Intubation 9/20  Extubation 9/21  Echocardiogram 9/20 LV EF: 45% -    50%  ------------------------------------------------------------------- Indications:      Cardiac arrest 427.5.  ------------------------------------------------------------------- History:   PMH:  Chronic kidney disease.  Atrial fibrillation. Coronary artery disease.  Congestive heart failure.  Risk factors: Hypertension. Dyslipidemia.  ------------------------------------------------------------------- Study Conclusions  - Left ventricle: The cavity size was normal. Wall thickness was   increased in a pattern of mild LVH. Systolic function was mildly   reduced. The estimated ejection fraction was in the range of 45%   to 50%. There is akinesis of the basal-midinferolateral and   inferior myocardium. Doppler parameters are consistent with   abnormal left ventricular relaxation (grade 1 diastolic   dysfunction). - Mitral valve: Calcified annulus. There was mild regurgitation.  Impressions:  - Akinesis of the basal/mid inferior and inferolateral walls with   overall mildly reduced LV systolic function; mild diastolic   dysfunction; mild LVH; mild MR.   Angiography planned  Antimicrobials:   Zosyn 9/19 >> 9/24  Oral vancomycin 9/20 >>    Subjective: Still coughing frequently, chest tenderness to palpation, feels malaise generally.  No new dyspnea, no new fever.  Vomiting, abdominal pain.  No orthopnea, leg swelling.  Objective: Vitals:   04/07/18 1941 04/07/18 2220 04/08/18 0426 04/08/18 0933  BP: (!) 120/44 (!) 144/43 (!) 145/67 (!) 142/122  Pulse:  78 70   Resp: 20  19   Temp: 98.1 F (36.7 C)  98.6 F (37 C)   TempSrc: Oral  Oral   SpO2:   98%   Weight:   59.1 kg   Height:        Intake/Output Summary (Last 24 hours) at 04/08/2018 1524 Last data filed at 04/08/2018 0345 Gross per 24 hour  Intake 624.11 ml  Output -  Net 624.11 ml   Filed Weights   04/07/18 0720 04/07/18 1145 04/08/18 0426  Weight: 83.6 kg 81.7 kg 59.1 kg     Examination: General appearance: Obese adult female, sitting in recliner, brushing her teeth, appears to have general malaise, no acute distress HEENT: Anicteric, conjunctiva pink, lids and lashes normal. No nasal deformity, discharge, epistaxis.  Lips moist, OP tacky drry, no oral lesions, hearing normal.   Skin: Warm and dry.  No jaundice.   Cardiac: Heart rate normal, irregular rhythm.  No murmurs appreciated.  JVP not visible due to body habitus.  No lower extremity edema. Respiratory: Somewhat tachypneic, lungs diminished throughout.  No wheezes appreciated Abdomen: Abdomen diffusely uncomfortable without rebound, guarding, distention MSK: No deformities or effusions in the large joints of the upper or lower extremities.. Neuro: Sitting up in recliner, oriented to place, following commands.  Moves all extremities with global weakness, normal strength.  Requires assistance for transfers from bed to chair.    Psych: Sensorium intact and responding to questions, attention diminished.  Affect blunted.    Data Reviewed: I have personally reviewed following labs and imaging studies:  CBC: Recent Labs  Lab 04/02/18 2043  04/04/18 0239 04/05/18 0229 04/06/18 0232 04/07/18 0535 04/08/18 0414  WBC 18.9*   < > 10.1 11.0* 10.3 9.1 8.9  NEUTROABS 15.1*  --   --   --   --   --   --   HGB 12.2   < > 8.7* 8.5* 8.8* 9.4* 8.8*  HCT 40.4   < > 28.4* 27.6* 28.1* 29.3* 28.3*  MCV 111.6*   < > 108.4* 109.1* 107.7* 106.2* 107.6*  PLT 204   < > 147* 143* 155 166 156   < > = values in this interval not displayed.   Basic Metabolic Panel: Recent Labs  Lab 04/02/18 2043 04/03/18 0605 04/04/18 0239 04/07/18 0535 04/08/18 0414  NA 140 140 140 137 137  K 3.8 3.8 3.9 4.2 3.6  CL 100 102 102 93* 95*  CO2 25 23 24 25 25   GLUCOSE 269* 203* 206* 83 156*  BUN 18 31* 51* 67* 32*  CREATININE 1.91* 2.42* 3.90* 5.93* 3.73*  CALCIUM 8.6* 8.4* 8.6* 9.4 8.2*  MG  --  1.8  --   --   --   PHOS  --   2.8  --   --   --    GFR: Estimated Creatinine Clearance: 10.7 mL/min (A) (by C-G formula based on SCr of 3.73 mg/dL (H)). Liver Function Tests: Recent Labs  Lab 04/02/18 2043 04/04/18 0239  AST 196* 45*  ALT 128* 53*  ALKPHOS 91 58  BILITOT 0.7 0.6  PROT 6.7 5.3*  ALBUMIN 3.1* 2.2*   No results for input(s): LIPASE, AMYLASE in the last 168 hours. No results for input(s): AMMONIA in the last 168 hours. Coagulation Profile: Recent Labs  Lab 04/02/18 2043  INR 1.04   Cardiac Enzymes: Recent Labs  Lab 04/03/18 0054 04/03/18 0605  TROPONINI 1.17* 1.14*   BNP (last 3 results) No results for input(s): PROBNP in the last 8760 hours. HbA1C: No results for input(s): HGBA1C in the last 72 hours. CBG: Recent Labs  Lab 04/07/18 1232 04/07/18 1634 04/07/18 2138 04/08/18 0749 04/08/18 1112  GLUCAP 86 244* 205* 132* 143*   Lipid Profile: No results for input(s): CHOL, HDL, LDLCALC, TRIG, CHOLHDL, LDLDIRECT in the last 72 hours. Thyroid Function Tests: No results for input(s): TSH, T4TOTAL,  FREET4, T3FREE, THYROIDAB in the last 72 hours. Anemia Panel: Recent Labs    04/07/18 0535  FERRITIN 1,432*  TIBC 220*  IRON 34   Urine analysis:    Component Value Date/Time   COLORURINE YELLOW 12/21/2015 2222   APPEARANCEUR CLEAR 12/21/2015 2222   LABSPEC 1.013 12/21/2015 2222   PHURINE 6.5 12/21/2015 2222   GLUCOSEU NEGATIVE 12/21/2015 2222   HGBUR MODERATE (A) 12/21/2015 2222   BILIRUBINUR NEGATIVE 12/21/2015 2222   KETONESUR NEGATIVE 12/21/2015 2222   PROTEINUR >300 (A) 12/21/2015 2222   UROBILINOGEN 0.2 04/19/2015 1248   NITRITE POSITIVE (A) 12/21/2015 2222   LEUKOCYTESUR TRACE (A) 12/21/2015 2222   Sepsis Labs: @LABRCNTIP (procalcitonin:4,lacticacidven:4)  ) Recent Results (from the past 240 hour(s))  Culture, blood (routine x 2)     Status: None   Collection Time: 04/02/18  8:00 PM  Result Value Ref Range Status   Specimen Description BLOOD LEFT UPPER ARM   Final   Special Requests   Final    BOTTLES DRAWN AEROBIC ONLY Blood Culture results may not be optimal due to an inadequate volume of blood received in culture bottles   Culture   Final    NO GROWTH 5 DAYS Performed at [facility], [place] [address], [place], [place] [address]    Report Status 04/07/2018 FINAL  Final  Culture, blood (routine x 2)     Status: None   Collection Time: 04/02/18  8:37 PM  Result Value Ref Range Status   Specimen Description BLOOD RIGHT ANTECUBITAL  Final   Special Requests   Final    BOTTLES DRAWN AEROBIC AND ANAEROBIC Blood Culture results may not be optimal due to an inadequate volume of blood received in culture bottles   Culture   Final    NO GROWTH 5 DAYS Performed at [facility], [place] [address] [place], [place] [address]    Report Status 04/07/2018 FINAL  Final  MRSA PCR Screening     Status: None   Collection Time: 04/02/18 11:37 PM  Result Value Ref Range Status   MRSA by PCR NEGATIVE NEGATIVE Final    Comment:        The GeneXpert MRSA Assay (FDA approved for NASAL specimens only), is one component of a comprehensive MRSA colonization surveillance program. It is not intended to diagnose MRSA infection nor to guide or monitor treatment for MRSA infections. Performed at [facility], [place] [address] [place], [place] [address]   C difficile quick scan w PCR reflex     Status: Abnormal   Collection Time: 04/03/18  2:29 AM  Result Value Ref Range Status   C Diff antigen POSITIVE (A) NEGATIVE Final   C Diff toxin NEGATIVE NEGATIVE Final   C Diff interpretation Results are indeterminate. See PCR results.  Final    Comment: Performed at [facility], [place] [address], [place], [place] [address]  C. Diff by PCR, Reflexed     Status: Abnormal   Collection Time: 04/03/18  2:29 AM  Result Value Ref Range Status   Toxigenic C. Difficile by PCR POSITIVE (A) NEGATIVE Final    Comment: Positive for toxigenic C. difficile with  little to no toxin production. Only treat if clinical presentation suggests symptomatic illness. Performed at [facility], [place] [address] [place], [place] [address]   Culture, respiratory (non-expectorated)     Status: None   Collection Time: 04/03/18  3:25 AM  Result Value Ref Range Status  Specimen Description TRACHEAL ASPIRATE  Final   Special Requests NONE  Final   Gram Stain   Final    MODERATE WBC PRESENT, PREDOMINANTLY MONONUCLEAR FEW GRAM POSITIVE COCCI FEW YEAST    Culture   Final    Consistent with normal respiratory flora. Performed at [facility], [place] [address] [place], [place] [address]    Report Status 04/05/2018 FINAL  Final         Radiology Studies: No results found.      Scheduled Meds: . [MAR Hold] aspirin  81 mg Oral Daily  . [MAR Hold] atorvastatin  40 mg Oral q1800  . [MAR Hold] B-complex with vitamin C  1 tablet Per Tube Daily  . [MAR Hold] brimonidine  1 drop Left Eye TID  . [MAR Hold] Chlorhexidine Gluconate Cloth  6 each Topical Q0600  . [MAR Hold] Chlorhexidine Gluconate Cloth  6 each Topical Q0600  . [MAR Hold] enoxaparin (LOVENOX) injection  30 mg Subcutaneous Q24H  . [MAR Hold] feeding supplement  1 Container Oral TID BM  . [MAR Hold] hydrALAZINE  25 mg Oral Q8H  . [MAR Hold] insulin aspart  2-6 Units Subcutaneous TID WC  . [MAR Hold] insulin glargine  15 Units Subcutaneous Daily  . [MAR Hold] latanoprost  1 drop Both Eyes QHS  . [MAR Hold] mouth rinse  15 mL Mouth Rinse BID  . [MAR Hold] metoprolol tartrate  50 mg Oral BID  . sodium chloride flush  3 mL Intravenous Q12H  . [MAR Hold] timolol  1 drop Left Eye Daily  . [MAR Hold] vancomycin  125 mg Oral QID   Continuous Infusions: . sodium chloride 10 mL/hr at 04/06/18 2355  . [MAR Hold] sodium chloride    . sodium chloride    . sodium chloride Stopped (04/08/18 1100)  . [MAR Hold] ferric gluconate (FERRLECIT/NULECIT) IV 125 mg (04/07/18 1945)     LOS: 6 days     Time spent: 25 minutes    Edwin Dada, MD [facility] 04/08/2018, 3:24 PM     Pager [phone] --- please page though AMION:  [email or web address] Password [email or web address] If 7PM-7AM, please contact night-coverage

## 2018-04-08 NOTE — Plan of Care (Signed)
  Problem: Activity: Goal: Risk for activity intolerance will decrease Outcome: Progressing   Problem: Clinical Measurements: Goal: Respiratory complications will improve Outcome: Completed/Met

## 2018-04-08 NOTE — Progress Notes (Signed)
[facility] NEPHROLOGY PROGRESS NOTE  Assessment/ Plan: Pt is a 77 y.o. yo female ESRD, cardiac arrest at home  Dialysis:[facility]  4h 76kg 3K/ 2Ca bath LUA AVG Hep none - mircera 100 q 2  #VF arrest: Positive troponin echo with new wall motion abnormalities.  Cardiology planning for cardiac cath today.   # ESRD: TTS, had dialysis yesterday with 2 L UF. Tolerated well. Plan for next HD tomorrow. Left upper extremity AV graft for access.  # Anemia: Hemoglobin 8-9. Iron sat 15 %, ferritin 1432, Started weekly nulecit.   # Secondary hyperparathyroidism: ca ok. Not on binders.   # HTN/volume: Blood pressure better controlled. On metoprol. Started hydralazine on 9/23.  Monitor blood pressure.  Volume status looks acceptable.    Subjective: Seen and examined at bedside. More alert today. No chest pain, SOB. Had HD yesterday. Plan for cardiac cath today.   Objective Vital signs in last 24 hours: Vitals:   04/07/18 1941 04/07/18 2220 04/08/18 0426 04/08/18 0933  BP: (!) 120/44 (!) 144/43 (!) 145/67 (!) 142/122  Pulse:  78 70   Resp: 20  19   Temp: 98.1 F (36.7 C)  98.6 F (37 C)   TempSrc: Oral  Oral   SpO2:   98%   Weight:   59.1 kg   Height:       Weight change: 0 kg  Intake/Output Summary (Last 24 hours) at 04/08/2018 1052 Last data filed at 04/08/2018 0345 Gross per 24 hour  Intake 744.11 ml  Output 2000 ml  Net -1255.89 ml       Labs: Basic Metabolic Panel: Recent Labs  Lab 04/03/18 0605 04/04/18 0239 04/07/18 0535 04/08/18 0414  NA 140 140 137 137  K 3.8 3.9 4.2 3.6  CL 102 102 93* 95*  CO2 23 24 25 25   GLUCOSE 203* 206* 83 156*  BUN 31* 51* 67* 32*  CREATININE 2.42* 3.90* 5.93* 3.73*  CALCIUM 8.4* 8.6* 9.4 8.2*  PHOS 2.8  --   --   --    Liver Function Tests: Recent Labs  Lab 04/02/18 2043 04/04/18 0239  AST 196* 45*  ALT 128* 53*  ALKPHOS 91 58  BILITOT 0.7 0.6  PROT 6.7 5.3*  ALBUMIN 3.1* 2.2*   No results for  input(s): LIPASE, AMYLASE in the last 168 hours. No results for input(s): AMMONIA in the last 168 hours. CBC: Recent Labs  Lab 04/02/18 2043  04/04/18 0239 04/05/18 0229 04/06/18 0232 04/07/18 0535 04/08/18 0414  WBC 18.9*   < > 10.1 11.0* 10.3 9.1 8.9  NEUTROABS 15.1*  --   --   --   --   --   --   HGB 12.2   < > 8.7* 8.5* 8.8* 9.4* 8.8*  HCT 40.4   < > 28.4* 27.6* 28.1* 29.3* 28.3*  MCV 111.6*   < > 108.4* 109.1* 107.7* 106.2* 107.6*  PLT 204   < > 147* 143* 155 166 156   < > = values in this interval not displayed.   Cardiac Enzymes: Recent Labs  Lab 04/03/18 0054 04/03/18 0605  TROPONINI 1.17* 1.14*   CBG: Recent Labs  Lab 04/07/18 0649 04/07/18 1232 04/07/18 1634 04/07/18 2138 04/08/18 0749  GLUCAP 119* 86 244* 205* 132*    Iron Studies:  Recent Labs    04/07/18 0535  IRON 34  TIBC 220*  FERRITIN 1,432*   Studies/Results: No results found.  Medications: Infusions: . sodium chloride 10 mL/hr at  04/06/18 2355  . sodium chloride    . sodium chloride    . sodium chloride    . ferric gluconate (FERRLECIT/NULECIT) IV 125 mg (04/07/18 1945)    Scheduled Medications: . aspirin  81 mg Oral Daily  . atorvastatin  40 mg Oral q1800  . B-complex with vitamin C  1 tablet Per Tube Daily  . brimonidine  1 drop Left Eye TID  . Chlorhexidine Gluconate Cloth  6 each Topical Q0600  . Chlorhexidine Gluconate Cloth  6 each Topical Q0600  . enoxaparin (LOVENOX) injection  30 mg Subcutaneous Q24H  . feeding supplement  1 Container Oral TID BM  . hydrALAZINE  25 mg Oral Q8H  . insulin aspart  2-6 Units Subcutaneous TID WC  . insulin glargine  15 Units Subcutaneous Daily  . latanoprost  1 drop Both Eyes QHS  . mouth rinse  15 mL Mouth Rinse BID  . metoprolol tartrate  50 mg Oral BID  . sodium chloride flush  3 mL Intravenous Q12H  . timolol  1 drop Left Eye Daily  . vancomycin  125 mg Oral QID    have reviewed scheduled and prn medications.  Physical  Exam: General: lying on bed comfortable, NAD Heart:RRR, s1s2 nl Lungs: clear bilateral, no wheezing Abdomen:soft, Non-tender, non-distended Extremities:no edema.  Dialysis Access: Left upper extremity AV graft has good bruit.  [name] 04/08/2018,10:52 AM  LOS: 6 days

## 2018-04-08 NOTE — Progress Notes (Addendum)
Progress Note  Patient Name: [name] Date of Encounter: 04/08/2018  Primary Cardiologist: Larae Grooms, MD  Subjective   Pt feeling well today. No c/o of chest pain or palpitations. More alert and less confused today.   Inpatient Medications    Scheduled Meds: . aspirin  81 mg Oral Daily  . atorvastatin  40 mg Oral q1800  . B-complex with vitamin C  1 tablet Per Tube Daily  . brimonidine  1 drop Left Eye TID  . Chlorhexidine Gluconate Cloth  6 each Topical Q0600  . Chlorhexidine Gluconate Cloth  6 each Topical Q0600  . enoxaparin (LOVENOX) injection  30 mg Subcutaneous Q24H  . feeding supplement  1 Container Oral TID BM  . hydrALAZINE  25 mg Oral Q8H  . insulin aspart  2-6 Units Subcutaneous TID WC  . insulin glargine  15 Units Subcutaneous Daily  . latanoprost  1 drop Both Eyes QHS  . mouth rinse  15 mL Mouth Rinse BID  . metoprolol tartrate  50 mg Oral BID  . sodium chloride flush  3 mL Intravenous Q12H  . timolol  1 drop Left Eye Daily  . vancomycin  125 mg Oral QID   Continuous Infusions: . sodium chloride 10 mL/hr at 04/06/18 2355  . sodium chloride    . sodium chloride    . sodium chloride    . ferric gluconate (FERRLECIT/NULECIT) IV 125 mg (04/07/18 1945)   PRN Meds: sodium chloride, sodium chloride, acetaminophen, alum & mag hydroxide-simeth, bisacodyl, calcium carbonate, docusate, Gerhardt's butt cream, hydrALAZINE, sodium chloride flush   Vital Signs    Vitals:   04/07/18 1636 04/07/18 1941 04/07/18 2220 04/08/18 0426  BP: (!) 124/43 (!) 120/44 (!) 144/43 (!) 145/67  Pulse: 78  78 70  Resp: 18 20  19   Temp: 99 F (37.2 C) 98.1 F (36.7 C)  98.6 F (37 C)  TempSrc: Oral Oral  Oral  SpO2: 100%   98%  Weight:    59.1 kg  Height:        Intake/Output Summary (Last 24 hours) at 04/08/2018 0744 Last data filed at 04/08/2018 0345 Gross per 24 hour  Intake 744.11 ml  Output 2000 ml  Net -1255.89 ml   Filed Weights   04/07/18 0720  04/07/18 1145 04/08/18 0426  Weight: 83.6 kg 81.7 kg 59.1 kg    Physical Exam   General: Elderly, NAD Skin: Warm, dry, intact  Head: Normocephalic, atraumatic, clear, moist mucus membranes. Neck: Negative for carotid bruits. No JVD Lungs:Clear to ausculation bilaterally. No wheezes, rales, or rhonchi. Breathing is unlabored. Cardiovascular: RRR with S1 S2. No murmurs, rubs, gallops, or LV heave appreciated. Abdomen: Soft, non-tender, non-distended with normoactive bowel sounds. No obvious abdominal masses. MSK: Strength and tone appear normal for age. 5/5 in all extremities Extremities: No edema. No clubbing or cyanosis. DP/PT pulses 2+ bilaterally Neuro: Alert and oriented to person, place and situation. No focal deficits. No facial asymmetry. MAE spontaneously. Psych: Responds to questions appropriately with normal affect.    Labs    Chemistry Recent Labs  Lab 04/02/18 2043  04/04/18 0239 04/07/18 0535 04/08/18 0414  NA 140   < > 140 137 137  K 3.8   < > 3.9 4.2 3.6  CL 100   < > 102 93* 95*  CO2 25   < > 24 25 25   GLUCOSE 269*   < > 206* 83 156*  BUN 18   < > 51*  67* 32*  CREATININE 1.91*   < > 3.90* 5.93* 3.73*  CALCIUM 8.6*   < > 8.6* 9.4 8.2*  PROT 6.7  --  5.3*  --   --   ALBUMIN 3.1*  --  2.2*  --   --   AST 196*  --  45*  --   --   ALT 128*  --  53*  --   --   ALKPHOS 91  --  58  --   --   BILITOT 0.7  --  0.6  --   --   GFRNONAA 24*   < > 10* 6* 11*  GFRAA 28*   < > 12* 7* 12*  ANIONGAP 15   < > 14 19* 17*   < > = values in this interval not displayed.     Hematology Recent Labs  Lab 04/06/18 0232 04/07/18 0535 04/08/18 0414  WBC 10.3 9.1 8.9  RBC 2.61* 2.76* 2.63*  HGB 8.8* 9.4* 8.8*  HCT 28.1* 29.3* 28.3*  MCV 107.7* 106.2* 107.6*  MCH 33.7 34.1* 33.5  MCHC 31.3 32.1 31.1  RDW 14.6 14.4 14.6  PLT 155 166 156    Cardiac Enzymes Recent Labs  Lab 04/03/18 0054 04/03/18 0605  TROPONINI 1.17* 1.14*    Recent Labs  Lab 04/02/18 1916    TROPIPOC 0.20*     BNPNo results for input(s): BNP, PROBNP in the last 168 hours.   DDimer No results for input(s): DDIMER in the last 168 hours.   Radiology    No results found.  Telemetry    09/251/9 NSR with HR in the 70-80's - Personally Reviewed  ECG    No new tracings as of 04/08/2017- Personally Reviewed  Cardiac Studies   Echocardiogram 03/2018: Study Conclusions  - Left ventricle: The cavity size was normal. Wall thickness was increased in a pattern of mild LVH. Systolic function was mildly reduced. The estimated ejection fraction was in the range of 45% to 50%. There is akinesis of the basal-midinferolateral and inferior myocardium. Doppler parameters are consistent with abnormal left ventricular relaxation (grade 1 diastolic dysfunction). - Mitral valve: Calcified annulus. There was mild regurgitation.  Impressions:  - Akinesis of the basal/mid inferior and inferolateral walls with overall mildly reduced LV systolic function; mild diastolic dysfunction; mild LVH; mild MR.  Patient Profile     77 y.o. female with VF arrest, prolonged QT, CAD s/p post CABG, DM type 2, end-stage renal disease, hospital course complicated by suspected aspiration pneumonia and confusion  Assessment & Plan    1.  VF arrest: -Patient presented with VF arrest requiring CPR with ROSC achieved prior to ED arrival -Echocardiogram with decreased LVEF at 40 to 45% with inferolateral wall motion abnormalities -Hospitalization complicated by confusion and aspiration pneumonia -Plan for definitive ischemic evaluation with cardiac catheterization today, 04/08/2017 -Will need to coordinate with HD>>>creatiitine, 3.73 today  -Pt aware and understands  -Further management guided by findings today   2.  Prolonged QT: -Celexa discontinued -Plan for cardiac catheterization today, 04/08/2018 -Pt has been NPO -Continue to avoid QT prolonging medications  3.   HTN: -Stable, 145/67, 144/43, 120/44, 124/43 -Hydralazine increased on 04/06/2018 for elevated BP -Continue current regimen with hydralazine and metoprolol  4.  HLD: -Stable, LDL 81 -Continue atorvastatin  5.  Suspected aspiration pneumonia: -s/p 5-day course of Zosyn -Pending management per primary team  6.  C. Difficile: -On vancomycin, day 6 of 10 -Continue management per primary team  7.  ESRD on HD: -Nephrology following with plans for HD today, 04/08/2018 prior to cath?  -Continue management per nephrology and primary team  Signed, Kathyrn Drown NP-C [facility] Pager: [phone] 04/08/2018, 7:44 AM     For questions or updates, please contact   Please consult [email or web address] for contact info under Cardiology/STEMI.  Personally seen and examined. Agree with above.  77 year old with cardiac arrest.  Neurologically much more alert, less confused.  Short-term memory was more of an issue yesterday. She had hemodialysis yesterday.  I think she is ready for cardiac catheterization today.  Discussed with family. Physical exam unchanged from yesterday other than improved neurologic status, regular rate and rhythm no murmurs, lungs are clear, no edema.  Cardiac arrest -Cardiac catheterization, EF 40 to 45% with inferolateral wall motion abnormalities, question coronary artery disease  Prolonged QT -Continue to avoid Celexa if possible.  Avoid Zofran and other QT prolonging agents.  C. difficile - Per primary team.  End-stage renal disease -Had hemodialysis yesterday.  Candee Furbish, MD

## 2018-04-08 NOTE — Evaluation (Signed)
Occupational Therapy Evaluation Patient Details Name: [name] MRN: [id number] DOB: [birth year] Today's Date: 04/08/2018    History of Present Illness Pt adm with vfib arrest with out of hospital CPR x 12 minutes. Intubated 9/19-9/21. Pt also +Cdiff. PMH - ESRD with recent initiation of HD, cabg, ckd, dm, mi, lt knee surgery, neck surgery, back surgery, afib, CEA, CVA, multiple falls.    Clinical Impression   PTA, pt was living with her husband and was independent in ADLs and light IADLs. Pt currently requiring Min A for UB ADLs, Max A for LB ADLs, and Min A +2 for functional mobility with RW. Pt presenting with decreased balance, strength, ROM, cognition, and activity tolerance. Pt agreeable to participate in therapy and family is very supportive. Pt would benefit from further acute OT to facilitate safe dc. Recommend dc to CIR for further OT to optimize safety, independence with ADLs, and return to PLOF as well as reduce caregiver burden.      Follow Up Recommendations  CIR;Supervision/Assistance - 24 hour    Equipment Recommendations  None recommended by OT    Recommendations for Other Services PT consult;Rehab consult     Precautions / Restrictions Precautions Precautions: Fall Restrictions Weight Bearing Restrictions: No      Mobility Bed Mobility Overal bed mobility: Needs Assistance Bed Mobility: Rolling;Sidelying to Sit Rolling: Min assist Sidelying to sit: Min assist Supine to sit: Min assist;+2 for safety/equipment;HOB elevated     General bed mobility comments: Assist to move legs off of bed, elevate trunk into sitting, and bring hips to EOB  Transfers Overall transfer level: Needs assistance Equipment used: Rolling walker (2 wheeled) Transfers: Sit to/from Stand Sit to Stand: +2 physical assistance;Min assist         General transfer comment: Assist to bring hips up and for balance. Verbal cues for hand placement    Balance Overall balance  assessment: Needs assistance Sitting-balance support: Feet supported;No upper extremity supported Sitting balance-Leahy Scale: Fair Sitting balance - Comments: UE support   Standing balance support: Bilateral upper extremity supported Standing balance-Leahy Scale: Poor Standing balance comment: walker and min assist for static standing                           ADL either performed or assessed with clinical judgement   ADL Overall ADL's : Needs assistance/impaired Eating/Feeding: Set up;Supervision/ safety;Sitting   Grooming: Oral care;Brushing hair;Supervision/safety;Set up;Sitting Grooming Details (indicate cue type and reason): Sitting in recliner, pt performing oral care and brushed her hair. Supervision and Min VCs for encouragement Upper Body Bathing: Minimal assistance;Sitting   Lower Body Bathing: Maximal assistance;Sit to/from stand Lower Body Bathing Details (indicate cue type and reason): Decreased ROM to bend forward due to chest pain Upper Body Dressing : Minimal assistance;Sitting   Lower Body Dressing: Maximal assistance;Sit to/from stand   Toilet Transfer: Minimal assistance;+2 for physical assistance;Ambulation;RW(Simulated to recliner) Toilet Transfer Details (indicate cue type and reason): Pt requiring Min A to power up into standing.          Functional mobility during ADLs: Minimal assistance;+2 for physical assistance;Rolling walker General ADL Comments: Pt presenting with decreased balance, strength, ROM, and activity tolerance.     Vision         Perception     Praxis      Pertinent Vitals/Pain Pain Assessment: Faces Faces Pain Scale: Hurts little more Pain Location: chest with coughing Pain Descriptors / Indicators: Grimacing;Guarding Pain Intervention(s):  Limited activity within patient's tolerance;Monitored during session;Repositioned;Other (comment)(used pillow for splinting)     Hand Dominance Right   Extremity/Trunk  Assessment Upper Extremity Assessment Upper Extremity Assessment: Generalized weakness   Lower Extremity Assessment Lower Extremity Assessment: Defer to PT evaluation       Communication Communication Communication: HOH   Cognition Arousal/Alertness: Awake/alert Behavior During Therapy: WFL for tasks assessed/performed Overall Cognitive Status: Impaired/Different from baseline Area of Impairment: Memory                     Memory: Decreased short-term memory     Awareness: Emergent   General Comments: Pt requiring increased time and cues during session. Pt presenting with decreased processing and memory   General Comments  VSS. Daughter and husband present.    Exercises     Shoulder Instructions      Home Living Family/patient expects to be discharged to:: Private residence Living Arrangements: Spouse/significant other Available Help at Discharge: Family;Available 24 hours/day Type of Home: House Home Access: Level entry     Home Layout: Two level;Able to live on main level with bedroom/bathroom     Bathroom Shower/Tub: Occupational psychologist: Handicapped height Bathroom Accessibility: Yes   Home Equipment: Environmental consultant - 2 wheels;Cane - single point;Bedside commode;Shower seat;Grab bars - toilet   Additional Comments: Daughter lives in house behind pt      Prior Functioning/Environment Level of Independence: Independent with assistive device(s)        Comments: Uses rolling walker or cane. Frequent falls. Shares the housework with her husband.        OT Problem List: Decreased strength;Decreased activity tolerance;Decreased range of motion;Impaired balance (sitting and/or standing);Decreased safety awareness;Decreased knowledge of use of DME or AE;Decreased knowledge of precautions;Decreased cognition;Pain      OT Treatment/Interventions: Self-care/ADL training;Therapeutic exercise;Energy conservation;DME and/or AE instruction;Therapeutic  activities;Patient/family education    OT Goals(Current goals can be found in the care plan section) Acute Rehab OT Goals Patient Stated Goal: return home OT Goal Formulation: With patient Time For Goal Achievement: 04/22/18 Potential to Achieve Goals: Good  OT Frequency: Min 2X/week   Barriers to D/C:            Co-evaluation PT/OT/SLP Co-Evaluation/Treatment: Yes Reason for Co-Treatment: For patient/therapist safety PT goals addressed during session: Mobility/safety with mobility OT goals addressed during session: ADL's and self-care      AM-PAC PT "6 Clicks" Daily Activity     Outcome Measure Help from another person eating meals?: None Help from another person taking care of personal grooming?: A Little Help from another person toileting, which includes using toliet, bedpan, or urinal?: A Little Help from another person bathing (including washing, rinsing, drying)?: A Lot Help from another person to put on and taking off regular upper body clothing?: A Little Help from another person to put on and taking off regular lower body clothing?: A Lot 6 Click Score: 17   End of Session Equipment Utilized During Treatment: Gait belt;Rolling walker Nurse Communication: Mobility status  Activity Tolerance: Patient tolerated treatment well;Patient limited by fatigue;Patient limited by pain Patient left: in chair;with call bell/phone within reach;with family/visitor present  OT Visit Diagnosis: Unsteadiness on feet (R26.81);Other abnormalities of gait and mobility (R26.89);Muscle weakness (generalized) (M62.81)                Time: 2094-7096 OT Time Calculation (min): 28 min Charges:  OT General Charges $OT Visit: 1 Visit OT Evaluation $OT Eval Moderate Complexity: 1 Mod  [place], OTR/L Acute Rehab Pager: [phone] Office: [place] 04/08/2018, 1:05 PM

## 2018-04-08 NOTE — Progress Notes (Signed)
Nutrition Follow Up  DOCUMENTATION CODES:   Not applicable  INTERVENTION:    Boost Breeze po TID, each supplement provides 250 kcal and 9 grams of protein  NEW NUTRITION DIAGNOSIS:   Increased nutrient needs related to acute illness as evidenced by estimated needs, ongoing  GOAL:   Patient will meet greater than or equal to 90% of their needs, progressing  MONITOR:   PO intake, Supplement acceptance, Labs, Skin, Weight trends, I & O's  ASSESSMENT:    77 yo female admitted post V.fib arrest with respiratory insufficiency requiring intubation, pneumonia post aspiration event, diarrhea with C.diff antigen +, toxin negative. PMH includes ESRD on HD, DM, HTN, HLD, CAD, PAF, MI   9/21 extubated, TF (Vital HP) discontinued via OGT  Several people in pt's room at time of visit; deferred speaking with [name]. Advanced to Clear Liquids post extubation, then HH/Carbohydrate Modified diet 9/22. Currently NPO for cardiac cath. Prior to this PO intake was variable at 25-60% per flowsheets.  Boost Breeze ordered TID 9/21. Per MAR, she is drinking some. Medications include B-complex with vitamin C.  Labs reviewed. BUN 32 (H). Cr 3.73 (H). CBG's 205-132-143.  PT recommending [facility].  Diet Order:   Diet Order            Diet NPO time specified Except for: Sips with Meds  Diet effective midnight             EDUCATION NEEDS:   Not appropriate for education at this time  Skin:  Skin Assessment: Skin Integrity Issues: Skin Integrity Issues:: Other (Comment) Other: MASD to buttocks  Last BM:  9/24 - rectal tube  Height:   Ht Readings from Last 1 Encounters:  04/02/18 5\' 4"  (1.626 m)   Weight:   Wt Readings from Last 1 Encounters:  04/08/18 59.1 kg   BMI:  Body mass index is 22.37 kg/m.  Estimated Nutritional Needs:   Kcal:  1600-1800  Protein:  80-95 gm  Fluid:  1,000 ml + UOP  Arthur Holms, RD, LDN Pager #: [phone] After-Hours Pager #:  [phone]

## 2018-04-08 NOTE — Progress Notes (Signed)
Inpatient Rehabilitation Admissions Coordinator  I have contacted Dr. Loleta Books via [facility] for recommendation for an inpt rehab consult  Danne Baxter, RN, MSN Rehab Admissions Coordinator [phone] 04/08/2018 4:50 PM

## 2018-04-09 ENCOUNTER — Encounter (HOSPITAL_COMMUNITY): Payer: Self-pay | Admitting: Interventional Cardiology

## 2018-04-09 DIAGNOSIS — R5381 Other malaise: Secondary | ICD-10-CM

## 2018-04-09 DIAGNOSIS — G931 Anoxic brain damage, not elsewhere classified: Secondary | ICD-10-CM

## 2018-04-09 LAB — BASIC METABOLIC PANEL
ANION GAP: 14 (ref 5–15)
Anion gap: 14 (ref 5–15)
BUN: 45 mg/dL — ABNORMAL HIGH (ref 8–23)
BUN: 15 mg/dL (ref 8–23)
CALCIUM: 8.7 mg/dL — AB (ref 8.9–10.3)
CHLORIDE: 98 mmol/L (ref 98–111)
CO2: 24 mmol/L (ref 22–32)
CO2: 28 mmol/L (ref 22–32)
CREATININE: 2.49 mg/dL — AB (ref 0.44–1.00)
Calcium: 8.8 mg/dL — ABNORMAL LOW (ref 8.9–10.3)
Chloride: 96 mmol/L — ABNORMAL LOW (ref 98–111)
Creatinine, Ser: 4.92 mg/dL — ABNORMAL HIGH (ref 0.44–1.00)
GFR calc Af Amer: 9 mL/min — ABNORMAL LOW (ref >60)
GFR calc non Af Amer: 8 mL/min — ABNORMAL LOW (ref >60)
GFR, EST AFRICAN AMERICAN: 20 mL/min — AB (ref >60)
GFR, EST NON AFRICAN AMERICAN: 17 mL/min — AB (ref >60)
GLUCOSE: 136 mg/dL — AB (ref 70–99)
Glucose, Bld: 119 mg/dL — ABNORMAL HIGH (ref 70–99)
POTASSIUM: 3.6 mmol/L (ref 3.5–5.1)
Potassium: 3.9 mmol/L (ref 3.5–5.1)
Sodium: 136 mmol/L (ref 135–145)
Sodium: 138 mmol/L (ref 135–145)

## 2018-04-09 LAB — GLUCOSE, CAPILLARY
GLUCOSE-CAPILLARY: 91 mg/dL (ref 70–99)
GLUCOSE-CAPILLARY: 138 mg/dL — AB (ref 70–99)
Glucose-Capillary: 102 mg/dL — ABNORMAL HIGH (ref 70–99)
Glucose-Capillary: 76 mg/dL (ref 70–99)

## 2018-04-09 LAB — CBC
HEMATOCRIT: 30.1 % — AB (ref 36.0–46.0)
HEMOGLOBIN: 9.4 g/dL — AB (ref 12.0–15.0)
MCH: 33.2 pg (ref 26.0–34.0)
MCHC: 31.2 g/dL (ref 30.0–36.0)
MCV: 106.4 fL — AB (ref 78.0–100.0)
Platelets: 174 10*3/uL (ref 150–400)
RBC: 2.83 MIL/uL — ABNORMAL LOW (ref 3.87–5.11)
RDW: 14.6 % (ref 11.5–15.5)
WBC: 10.1 10*3/uL (ref 4.0–10.5)

## 2018-04-09 LAB — MAGNESIUM: Magnesium: 2 mg/dL (ref 1.7–2.4)

## 2018-04-09 MED ORDER — SODIUM CHLORIDE 0.9 % IV SOLN: 100.0000 mL | INTRAVENOUS | Status: DC | PRN

## 2018-04-09 MED ORDER — HEPARIN SODIUM (PORCINE) 1000 UNIT/ML DIALYSIS: 1000.0000 [IU] | INTRAMUSCULAR | Status: DC | PRN

## 2018-04-09 MED ORDER — LIDOCAINE HCL (PF) 1 % IJ SOLN: 5.0000 mL | INTRAMUSCULAR | Status: DC | PRN

## 2018-04-09 MED ORDER — PENTAFLUOROPROP-TETRAFLUOROETH EX AERO: 1.0000 "application " | INHALATION_SPRAY | CUTANEOUS | Status: DC | PRN

## 2018-04-09 MED ORDER — ALTEPLASE 2 MG IJ SOLR: 2.0000 mg | Freq: Once | INTRAMUSCULAR | Status: DC | PRN

## 2018-04-09 MED ORDER — LIDOCAINE-PRILOCAINE 2.5-2.5 % EX CREA: 1.0000 "application " | TOPICAL_CREAM | CUTANEOUS | Status: DC | PRN

## 2018-04-09 MED ORDER — DARBEPOETIN ALFA 100 MCG/0.5ML IJ SOSY
100.0000 ug | PREFILLED_SYRINGE | INTRAMUSCULAR | Status: DC
  Administered 2018-04-09: 13:00:00 100 ug via INTRAVENOUS
  Filled 2018-04-09: qty 0.5

## 2018-04-09 MED ORDER — GUAIFENESIN 100 MG/5ML PO SOLN
5.0000 mL | ORAL | Status: DC | PRN
  Administered 2018-04-09: 100 mg via ORAL
  Filled 2018-04-09 (×2): qty 5

## 2018-04-09 MED FILL — Heparin Sod (Porcine)-NaCl IV Soln 1000 Unit/500ML-0.9%: INTRAVENOUS | Qty: 500 | Status: AC

## 2018-04-09 NOTE — Progress Notes (Signed)
PROGRESS NOTE    [name]  TGG:[id number] DOB: 1941-02-04 DOA: 04/02/2018 PCP: Josetta Huddle, MD      Brief Narrative:  [name] is a 77 y.o. F with ESRD on HD TThS, coronary disease, HTN, and IDDM who presented with VF arrest.  Not cooled because was able to interact immediately after resuscitation.  Also diarrhea on admission, Cdiff antigen positive and suspected aspiration pneumonia.     Assessment & Plan:  VF arrest 12 minutes OOH CPR.  ROSC by time of arrival to ER.  Not cooled due to ability to interact on arrival to hospital.  Echo now with reduced EF, RWMA.  Got 48 hours heparin gtt.  Left heart cath yesterday with DES to SVG to RCA. -Continue aspirin and Brilinta for 12 months -Continue atorvastatin -Continue metoprolol -ACEi/ARB contraindicated with new renal failure, unless determined otherwise by Nephrology -Consult to cardiology, appreciate expert cares   Hypertension -Continue metoprolol, hydralazine  C. difficile diarrhea Profuse foul-smelling diarrhea at admission.  C. difficile antigen positive, toxin negative. -Continue oral Vanc, day 7 of 10  Aspiration pneumonia Completed 5 days of Zosyn.  WBC resolved.  ESRD on HD -Continue nephrology, appreciate expert cares  Diabetes Glucose well controlled -Continue Glargine -Continue SSI  Anemia of chronic renal insufficiency No change to Hgb  Thrombocytopenia Resolved  Prolonged QTc -SSRI held       DVT prophylaxis: Heparin Code Status: FULL Family Communication: Daughter and husband MDM and disposition Plan: The below labs and imaging reports were reviewed and summarized above.  Medication management as above.  The patient was admitted with ventricular fibrillation cardiac arrest.  She had an out-of-hospital CPR, recovered spontaneous circulation around the time of arrival to the ER.  Did not get called because she was artery responsive.  Echocardiogram showed new regional wall motion  abnormality, and angiography was performed with stenting of her vein graft yesterday.  She is significantly debilitated from her cardiac arrest, and C. difficile diarrhea, and will need rehab debilitation placement for safe discharge.  Currently in evaluation for CIR.     Consultants:   Cardiology  Nephrology  CCM  Procedures:   Intubation 9/20  Extubation 9/21  Echocardiogram 9/20 LV EF: 45% -   50%  ------------------------------------------------------------------- Indications:      Cardiac arrest 427.5.  ------------------------------------------------------------------- History:   PMH:  Chronic kidney disease.  Atrial fibrillation. Coronary artery disease.  Congestive heart failure.  Risk factors: Hypertension. Dyslipidemia.  ------------------------------------------------------------------- Study Conclusions  - Left ventricle: The cavity size was normal. Wall thickness was   increased in a pattern of mild LVH. Systolic function was mildly   reduced. The estimated ejection fraction was in the range of 45%   to 50%. There is akinesis of the basal-midinferolateral and   inferior myocardium. Doppler parameters are consistent with   abnormal left ventricular relaxation (grade 1 diastolic   dysfunction). - Mitral valve: Calcified annulus. There was mild regurgitation.  Impressions:  - Akinesis of the basal/mid inferior and inferolateral walls with   overall mildly reduced LV systolic function; mild diastolic   dysfunction; mild LVH; mild MR.   Angiography 9/25  Ost 1st Mrg lesion is 75% stenosed.  Prox Cx lesion is 100% stenosed. Left to left collaterals. Small AV groove circumflex.  Ost 1st Diag lesion is 100% stenosed. SVG to diag is patent.  Prox LAD lesion is 95% stenosed. LIMA to LAD is patent.  Prox RCA lesion is 95% stenosed. Mid Graft lesion is 95% stenosed  in SVG to PLA.  A drug-eluting stent was successfully placed using a STENT RESOLUTE  ONYX 2.0X15.  Post intervention, there is a 0% residual stenosis.  LV end diastolic pressure is normal.  There is no aortic valve stenosis.  Calcified femoral artery.     Recommend uninterrupted dual antiplatelet therapy with Aspirin 81mg  daily and Ticagrelor 90mg  twice daily for a minimum of 12 months (ACS - Class I recommendation).       Antimicrobials:   Zosyn 9/19 >> 9/24  Oral vancomycin 9/20 >>    Subjective: Very weak and general malaise, but no chest pain, dyspnea, orthopnea, headache, confusion.  Objective: Vitals:   04/09/18 1145 04/09/18 1215 04/09/18 1245 04/09/18 1355  BP: (!) 111/47 (!) 117/45 (!) 119/49 (!) 169/39  Pulse: 76 77 76 79  Resp:    17  Temp:   97.9 F (36.6 C) 98 F (36.7 C)  TempSrc:   Oral Oral  SpO2:   98% 97%  Weight:   57 kg   Height:        Intake/Output Summary (Last 24 hours) at 04/09/2018 1540 Last data filed at 04/09/2018 1245 Gross per 24 hour  Intake -  Output 2200 ml  Net -2200 ml   Filed Weights   04/09/18 0400 04/09/18 0830 04/09/18 1245  Weight: 59.1 kg 59.2 kg 57 kg    Examination: General appearance: Obese adult female, lying in bed, on dialysis.  No obvious distress. HEENT: Anicteric, conjunctive are pink, lids and lashes normal.  No nasal deformity, discharge, or epistaxis.  Lips moist, dentition poor, oropharynx moist, no oral lesions.  Hearing normal. Skin: Warm and dry.  No jaundice.   Cardiac: Heart rate normal, no murmurs appreciated, JVP not visible, no lower extremity edema. Respiratory: Lungs clear to auscultation, respiratory rate normal.  No wheezes or rales. Abdomen: No abdominal discomfort or tenderness to palpation or guarding.  No distention. MSK: No deformities or effusions in the large joints of the upper or lower extremities.. Neuro: Seen on dialysis, oriented to place, follows commands.  Moves all extremities with global weakness, symmetric coordination. Psych: Sensorium intact and  responding to questions, attention diminished, affect blunted.    Data Reviewed: I have personally reviewed following labs and imaging studies:  CBC: Recent Labs  Lab 04/02/18 2043  04/05/18 0229 04/06/18 0232 04/07/18 0535 04/08/18 0414 04/09/18 0319  WBC 18.9*   < > 11.0* 10.3 9.1 8.9 10.1  NEUTROABS 15.1*  --   --   --   --   --   --   HGB 12.2   < > 8.5* 8.8* 9.4* 8.8* 9.4*  HCT 40.4   < > 27.6* 28.1* 29.3* 28.3* 30.1*  MCV 111.6*   < > 109.1* 107.7* 106.2* 107.6* 106.4*  PLT 204   < > 143* 155 166 156 174   < > = values in this interval not displayed.   Basic Metabolic Panel: Recent Labs  Lab 04/03/18 0605 04/04/18 0239 04/07/18 0535 04/08/18 0414 04/09/18 0319  NA 140 140 137 137 136  K 3.8 3.9 4.2 3.6 3.6  CL 102 102 93* 95* 98  CO2 23 24 25 25 24   GLUCOSE 203* 206* 83 156* 136*  BUN 31* 51* 67* 32* 45*  CREATININE 2.42* 3.90* 5.93* 3.73* 4.92*  CALCIUM 8.4* 8.6* 9.4 8.2* 8.8*  MG 1.8  --   --   --   --   PHOS 2.8  --   --   --   --  GFR: Estimated Creatinine Clearance: 8.1 mL/min (A) (by C-G formula based on SCr of 4.92 mg/dL (H)). Liver Function Tests: Recent Labs  Lab 04/02/18 2043 04/04/18 0239  AST 196* 45*  ALT 128* 53*  ALKPHOS 91 58  BILITOT 0.7 0.6  PROT 6.7 5.3*  ALBUMIN 3.1* 2.2*   No results for input(s): LIPASE, AMYLASE in the last 168 hours. No results for input(s): AMMONIA in the last 168 hours. Coagulation Profile: Recent Labs  Lab 04/02/18 2043  INR 1.04   Cardiac Enzymes: Recent Labs  Lab 04/03/18 0054 04/03/18 0605  TROPONINI 1.17* 1.14*   BNP (last 3 results) No results for input(s): PROBNP in the last 8760 hours. HbA1C: No results for input(s): HGBA1C in the last 72 hours. CBG: Recent Labs  Lab 04/08/18 1112 04/08/18 1710 04/08/18 2143 04/09/18 0601 04/09/18 1347  GLUCAP 143* 120* 108* 102* 91   Lipid Profile: No results for input(s): CHOL, HDL, LDLCALC, TRIG, CHOLHDL, LDLDIRECT in the last 72  hours. Thyroid Function Tests: No results for input(s): TSH, T4TOTAL, FREET4, T3FREE, THYROIDAB in the last 72 hours. Anemia Panel: Recent Labs    04/07/18 0535  FERRITIN 1,432*  TIBC 220*  IRON 34   Urine analysis:    Component Value Date/Time   COLORURINE YELLOW 12/21/2015 2222   APPEARANCEUR CLEAR 12/21/2015 2222   LABSPEC 1.013 12/21/2015 2222   PHURINE 6.5 12/21/2015 2222   GLUCOSEU NEGATIVE 12/21/2015 2222   HGBUR MODERATE (A) 12/21/2015 2222   BILIRUBINUR NEGATIVE 12/21/2015 2222   KETONESUR NEGATIVE 12/21/2015 2222   PROTEINUR >300 (A) 12/21/2015 2222   UROBILINOGEN 0.2 04/19/2015 1248   NITRITE POSITIVE (A) 12/21/2015 2222   LEUKOCYTESUR TRACE (A) 12/21/2015 2222   Sepsis Labs: @LABRCNTIP (procalcitonin:4,lacticacidven:4)  ) Recent Results (from the past 240 hour(s))  Culture, blood (routine x 2)     Status: None   Collection Time: 04/02/18  8:00 PM  Result Value Ref Range Status   Specimen Description BLOOD LEFT UPPER ARM  Final   Special Requests   Final    BOTTLES DRAWN AEROBIC ONLY Blood Culture results may not be optimal due to an inadequate volume of blood received in culture bottles   Culture   Final    NO GROWTH 5 DAYS Performed at [facility], [place] [address] [place], [place] [address]    Report Status 04/07/2018 FINAL  Final  Culture, blood (routine x 2)     Status: None   Collection Time: 04/02/18  8:37 PM  Result Value Ref Range Status   Specimen Description BLOOD RIGHT ANTECUBITAL  Final   Special Requests   Final    BOTTLES DRAWN AEROBIC AND ANAEROBIC Blood Culture results may not be optimal due to an inadequate volume of blood received in culture bottles   Culture   Final    NO GROWTH 5 DAYS Performed at [facility], [place] [address], [place], [place] [address]    Report Status 04/07/2018 FINAL  Final  MRSA PCR Screening     Status: None   Collection Time: 04/02/18 11:37 PM  Result Value Ref Range Status   MRSA by PCR  NEGATIVE NEGATIVE Final    Comment:        The GeneXpert MRSA Assay (FDA approved for NASAL specimens only), is one component of a comprehensive MRSA colonization surveillance program. It is not intended to diagnose MRSA infection nor to guide or monitor treatment for MRSA infections. Performed at [facility], [place]  [address], [place], [place] [address]   C difficile quick scan w PCR reflex     Status: Abnormal   Collection Time: 04/03/18  2:29 AM  Result Value Ref Range Status   C Diff antigen POSITIVE (A) NEGATIVE Final   C Diff toxin NEGATIVE NEGATIVE Final   C Diff interpretation Results are indeterminate. See PCR results.  Final    Comment: Performed at [facility], [place] [address], [place], [place] [address]  C. Diff by PCR, Reflexed     Status: Abnormal   Collection Time: 04/03/18  2:29 AM  Result Value Ref Range Status   Toxigenic C. Difficile by PCR POSITIVE (A) NEGATIVE Final    Comment: Positive for toxigenic C. difficile with little to no toxin production. Only treat if clinical presentation suggests symptomatic illness. Performed at [facility], [place] [address], [place], [place] [address]   Culture, respiratory (non-expectorated)     Status: None   Collection Time: 04/03/18  3:25 AM  Result Value Ref Range Status   Specimen Description TRACHEAL ASPIRATE  Final   Special Requests NONE  Final   Gram Stain   Final    MODERATE WBC PRESENT, PREDOMINANTLY MONONUCLEAR FEW GRAM POSITIVE COCCI FEW YEAST    Culture   Final    Consistent with normal respiratory flora. Performed at [facility], [place] [address] [place], [place] [address]    Report Status 04/05/2018 FINAL  Final         Radiology Studies: No results found.      Scheduled Meds: . aspirin  81 mg Oral Daily  . atorvastatin  40 mg Oral q1800  . B-complex with vitamin C  1 tablet Per Tube Daily  . brimonidine  1 drop Left Eye TID  . Chlorhexidine Gluconate Cloth  6 each  Topical Q0600  . Chlorhexidine Gluconate Cloth  6 each Topical Q0600  . darbepoetin (ARANESP) injection - DIALYSIS  100 mcg Intravenous Q Thu-HD  . feeding supplement  1 Container Oral TID BM  . heparin  5,000 Units Subcutaneous Q8H  . hydrALAZINE  25 mg Oral Q8H  . insulin aspart  2-6 Units Subcutaneous TID WC  . insulin glargine  15 Units Subcutaneous Daily  . latanoprost  1 drop Both Eyes QHS  . mouth rinse  15 mL Mouth Rinse BID  . metoprolol tartrate  50 mg Oral BID  . sodium chloride flush  3 mL Intravenous Q12H  . ticagrelor  90 mg Oral BID  . timolol  1 drop Left Eye Daily  . vancomycin  125 mg Oral QID   Continuous Infusions: . sodium chloride 10 mL/hr at 04/06/18 2355  . sodium chloride    . sodium chloride    . sodium chloride    . sodium chloride    . ferric gluconate (FERRLECIT/NULECIT) IV 125 mg (04/07/18 1945)     LOS: 7 days    Time spent: 25 minutes    Edwin Dada, MD [facility] 04/09/2018, 3:40 PM     Pager [phone] --- please page though AMION:  [email or web address] Password [email or web address] If 7PM-7AM, please contact night-coverage

## 2018-04-09 NOTE — Consult Note (Signed)
[facility] CM Primary Care Navigator  04/09/2018  [name] 1940-12-30 [id number]   Met with patient, husband [name]) and daughter [name]- lives next door) toidentify possible discharge needs. Per MD note, patient was admitted with a ventricular fibrillation cardiac arrest that led to thisadmission.  Patient's husbandendorsesDr.Robert Espinoza with [name] [place] the primary care provider.   [facility] [place] [facility].  Patient's daughter (works as med Designer, multimedia) has beenmanagingher medications at home, with use of "pill box" system filled twice a week.  Patient's husband has been driving and providing transportation to herdoctors' appointments.Patient's daughter or granddaughters are also able to provide transportation for patient.  Patient's husband and daughter serve as her primary caregivers at home. Granddaughters will also assist in providing care if needed after discharge.   Anticipated plan for discharge [place] per therapy recommendationfor rehabilitation.  Patient and familyvoiced understandingto callprimary careprovider'soffice whenshe returns backhomefor a post discharge follow-upvisitwithin1- 2 weeksor sooner if needs arise.Patient letter (with PCP's contact number) was provided as areminder.   Explained topatient and familyregarding [facility] CM services available for health management andresourcesat homeand denied anyneeds or concerns at thispoint. Daughter recalled that patient had previously used [facility]- CM services (managing DM) and husband states that they still have the [facility] [place] [facility] magnet on their refrigerator in case needed. Patient and family are aware and knowledgeable of ways to manage her health issues at home.   Patient and familyverbalizedunderstandingof needto seekreferral from primary care provider to [facility] care  management ifdeemed necessary and appropriatefor further servicesin the future- oncepatient returns back home.   Primary care provider's office is listed as providing transition of care (TOC) follow-up.    For additional questions please contact:  [name], BSN, RN-BC [facility] PRIMARY CARE Navigator Cell: [phone]

## 2018-04-09 NOTE — Progress Notes (Addendum)
Progress Note  Patient Name: [name] Date of Encounter: 04/09/2018  Primary Cardiologist: Larae Grooms, MD  Subjective   Pt seen in HD. Doing well. Right groin cath site unremarkable. Denies chest pain or palpitations.   Inpatient Medications    Scheduled Meds: . aspirin  81 mg Oral Daily  . atorvastatin  40 mg Oral q1800  . B-complex with vitamin C  1 tablet Per Tube Daily  . brimonidine  1 drop Left Eye TID  . Chlorhexidine Gluconate Cloth  6 each Topical Q0600  . Chlorhexidine Gluconate Cloth  6 each Topical Q0600  . darbepoetin (ARANESP) injection - DIALYSIS  100 mcg Intravenous Q Thu-HD  . feeding supplement  1 Container Oral TID BM  . heparin  5,000 Units Subcutaneous Q8H  . hydrALAZINE  25 mg Oral Q8H  . insulin aspart  2-6 Units Subcutaneous TID WC  . insulin glargine  15 Units Subcutaneous Daily  . latanoprost  1 drop Both Eyes QHS  . mouth rinse  15 mL Mouth Rinse BID  . metoprolol tartrate  50 mg Oral BID  . sodium chloride flush  3 mL Intravenous Q12H  . ticagrelor  90 mg Oral BID  . timolol  1 drop Left Eye Daily  . vancomycin  125 mg Oral QID   Continuous Infusions: . sodium chloride 10 mL/hr at 04/06/18 2355  . sodium chloride    . sodium chloride    . sodium chloride    . sodium chloride    . ferric gluconate (FERRLECIT/NULECIT) IV 125 mg (04/07/18 1945)   PRN Meds: sodium chloride, sodium chloride, sodium chloride, sodium chloride, acetaminophen, alteplase, alum & mag hydroxide-simeth, bisacodyl, calcium carbonate, docusate, Gerhardt's butt cream, heparin, hydrALAZINE, lidocaine (PF), lidocaine-prilocaine, ondansetron (ZOFRAN) IV, pentafluoroprop-tetrafluoroeth, sodium chloride flush   Vital Signs    Vitals:   04/09/18 0845 04/09/18 0850 04/09/18 0920 04/09/18 0945  BP: (!) 145/43 (!) 148/42 (!) 118/45 (!) 121/44  Pulse: 77 73 77 78  Resp: 19 18    Temp: 98.2 F (36.8 C)     TempSrc: Oral     SpO2: 96% 97%  96%  Weight:        Height:        Intake/Output Summary (Last 24 hours) at 04/09/2018 1021 Last data filed at 04/09/2018 0601 Gross per 24 hour  Intake -  Output 0 ml  Net 0 ml   Filed Weights   04/08/18 0426 04/09/18 0400 04/09/18 0830  Weight: 59.1 kg 59.1 kg 59.2 kg    Physical Exam   General: Elderly, NAD Skin: Warm, dry, intact  Head: Normocephalic, atraumatic, sclera non-icteric, no xanthomas, clear, moist mucus membranes. Neck: Negative for carotid bruits. No JVD Lungs:Clear to ausculation bilaterally. No wheezes, rales, or rhonchi. Breathing is unlabored. Cardiovascular: RRR with S1 S2. No murmurs, rubs, gallops, or LV heave appreciated. Abdomen: Soft, non-tender, non-distended with normoactive bowel sounds. No obvious abdominal masses. MSK: Strength and tone appear normal for age. 5/5 in all extremities Extremities: No edema. No clubbing or cyanosis. DP/PT pulses 2+ bilaterally Neuro: Alert and oriented. No focal deficits. No facial asymmetry. MAE spontaneously. Psych: Responds to questions appropriately with normal affect.    Labs    Chemistry Recent Labs  Lab 04/02/18 2043  04/04/18 0239 04/07/18 0535 04/08/18 0414 04/09/18 0319  NA 140   < > 140 137 137 136  K 3.8   < > 3.9 4.2 3.6 3.6  CL 100   < >  102 93* 95* 98  CO2 25   < > 24 25 25 24   GLUCOSE 269*   < > 206* 83 156* 136*  BUN 18   < > 51* 67* 32* 45*  CREATININE 1.91*   < > 3.90* 5.93* 3.73* 4.92*  CALCIUM 8.6*   < > 8.6* 9.4 8.2* 8.8*  PROT 6.7  --  5.3*  --   --   --   ALBUMIN 3.1*  --  2.2*  --   --   --   AST 196*  --  45*  --   --   --   ALT 128*  --  53*  --   --   --   ALKPHOS 91  --  58  --   --   --   BILITOT 0.7  --  0.6  --   --   --   GFRNONAA 24*   < > 10* 6* 11* 8*  GFRAA 28*   < > 12* 7* 12* 9*  ANIONGAP 15   < > 14 19* 17* 14   < > = values in this interval not displayed.     Hematology Recent Labs  Lab 04/07/18 0535 04/08/18 0414 04/09/18 0319  WBC 9.1 8.9 10.1  RBC 2.76* 2.63* 2.83*   HGB 9.4* 8.8* 9.4*  HCT 29.3* 28.3* 30.1*  MCV 106.2* 107.6* 106.4*  MCH 34.1* 33.5 33.2  MCHC 32.1 31.1 31.2  RDW 14.4 14.6 14.6  PLT 166 156 174    Cardiac Enzymes Recent Labs  Lab 04/03/18 0054 04/03/18 0605  TROPONINI 1.17* 1.14*    Recent Labs  Lab 04/02/18 1916  TROPIPOC 0.20*     BNPNo results for input(s): BNP, PROBNP in the last 168 hours.   DDimer No results for input(s): DDIMER in the last 168 hours.   Radiology    No results found.  Telemetry    04/09/18 NSR- Personally Reviewed  ECG    NSR with PVC 04/09/18- Personally Reviewed  Cardiac Studies   Cardiac catheterization 04/08/18:  Ost 1st Mrg lesion is 75% stenosed.  Prox Cx lesion is 100% stenosed. Left to left collaterals. Small AV groove circumflex.  Ost 1st Diag lesion is 100% stenosed. SVG to diag is patent.  Prox LAD lesion is 95% stenosed. LIMA to LAD is patent.  Prox RCA lesion is 95% stenosed. Mid Graft lesion is 95% stenosed in SVG to PLA.  A drug-eluting stent was successfully placed using a STENT RESOLUTE ONYX 2.0X15.  Post intervention, there is a 0% residual stenosis.  LV end diastolic pressure is normal.  There is no aortic valve stenosis.  Calcified femoral artery.   Echocardiogram 03/2018: Study Conclusions  - Left ventricle: The cavity size was normal. Wall thickness was increased in a pattern of mild LVH. Systolic function was mildly reduced. The estimated ejection fraction was in the range of 45% to 50%. There is akinesis of the basal-midinferolateral and inferior myocardium. Doppler parameters are consistent with abnormal left ventricular relaxation (grade 1 diastolic dysfunction). - Mitral valve: Calcified annulus. There was mild regurgitation.  Impressions:  - Akinesis of the basal/mid inferior and inferolateral walls with overall mildly reduced LV systolic function; mild diastolic dysfunction; mild LVH; mild MR.  Patient Profile      77 y.o. female with VF arrest, prolonged QT, CADs/ppost CABG,DM type 2,end-stage renal disease, hospital course complicated bysuspected aspiration pneumoniaandconfusion.   Assessment & Plan    1.  VF arrest: -Patient presented  with VF arrest requiring CPR with ROSC achieved prior to ED arrival -Echocardiogram with decreased LVEF at 40 to 45% with inferolateral wall motion abnormalities -Hospitalization complicated by confusion and aspiration pneumonia -Cardiac catheterization completed 04/08/2017 with successful PCI/DES to SVG to PLA with recommendations for DAPT with ASA 81 and Ticagrelor 90mg  twice daily for a minimum of 12 months  2.  Prolonged QT: -Celexa discontinued -Cardiac catheterization as noted above  -Continue to avoid QT prolonging medications  3.  HTN: -Stable, 121/44>118/45>148/42 -Hydralazine increased on 04/06/2018 for elevated BP -Continue current regimen with hydralazine and metoprolol  4.  HLD: -Stable, LDL 81 -Continue atorvastatin  5.  Suspected aspiration pneumonia: -s/p 5-day course of Zosyn -Pending management per primary team  6.  C. Difficile: -On vancomycin, day 6 of 10 -Continue management per primary team  7.  ESRD on HD: -Nephrology following, HD T, TH, Sat -Continue management per nephrology and primary team  Signed, Kathyrn Drown NP-C [facility] Pager: [phone] 04/09/2018, 10:21 AM     For questions or updates, please contact   Please consult [email or web address] for contact info under Cardiology/STEMI.  Personally seen and examined. Agree with above.  Feels weak but she is improving day by day with mental clarity.  No significant chest pain, no shortness of breath.  Currently in hemodialysis.  Exam: Alert, regular rate and rhythm, lungs are clear, hemodialysis access noted.  No significant edema  Cardiac cath 04/08/2018: Successful SVG to RCA stent placement  Assessment and plan:  VF arrest with revascularization SVG to  RCA stent -Continue with dual antiplatelet therapy for 12 months Brilinta, aspirin. -Continue with metoprolol. -Since revascularization took place, there is no indication for defibrillator. -Telemetry has been unremarkable. -Native right coronary artery has severe proximal calcification and would be high risk for revascularization, requiring atherectomy catheter.  Prolonged QT - Avoiding Celexa.  Avoiding QT prolonging agents.  Essential hypertension has been stable.  C. difficile -Precautions noted.  On vancomycin.  End-stage renal disease on hemodialysis - Tuesday Thursday Saturday.  Nephrology team.  I am comfortable with her discharge to rehab when able.  No further cardiac recommendations at this time.  Discussed with family.  We will sign off.  Please let us know if you have any further questions.  [facility] will sign off.   Medication Recommendations: As above Other recommendations (labs, testing, etc): None Follow up as an outpatient: 2 to 4 weeks with Dr. Irish Lack or APP on his team.  I will have arranged.  Candee Furbish, MD

## 2018-04-09 NOTE — Progress Notes (Signed)
[facility] NEPHROLOGY PROGRESS NOTE  Assessment/ Plan: Pt is a 77 y.o. yo female ESRD, cardiac arrest at home  Dialysis:[facility]  4h 76kg 3K/ 2Ca bath LUA AVG Hep none - mircera 100 q 2  #VF arrest: Positive troponin echo with new wall motion abnormalities.  S/p cath on 9/25 with stent placement. Now on aspirin and brilinta for at least 12 months. No chest pain or SOB today.  # ESRD: TTS, dialysis today. Tolerating well.  Left upper extremity AV graft for access.  # Anemia: Hemoglobin 8-9. Iron sat 15 %, ferritin 1432, Started weekly nulecit.   # Secondary hyperparathyroidism: ca ok. Not on binders.   # HTN/volume: monitor BP. On metoprol. Started hydralazine on 9/23.   Volume status looks acceptable.    Subjective: Seen and examined at dialysis unit.  Denies headache, dizziness, nausea vomiting chest pain shortness of breath.  Objective Vital signs in last 24 hours: Vitals:   04/09/18 0200 04/09/18 0400 04/09/18 0700 04/09/18 0742  BP: (!) 160/33 (!) 179/61 (!) 174/63 (!) 176/91  Pulse: 74 78  84  Resp: (!) 21 (!) 23 (!) 21 16  Temp:  97.8 F (36.6 C)  97.6 F (36.4 C)  TempSrc:  Oral  Oral  SpO2: 96% 95% 96% 96%  Weight:  59.1 kg    Height:       Weight change: -24.5 kg  Intake/Output Summary (Last 24 hours) at 04/09/2018 0932 Last data filed at 04/09/2018 0601 Gross per 24 hour  Intake -  Output 0 ml  Net 0 ml       Labs: Basic Metabolic Panel: Recent Labs  Lab 04/03/18 0605  04/07/18 0535 04/08/18 0414 04/09/18 0319  NA 140   < > 137 137 136  K 3.8   < > 4.2 3.6 3.6  CL 102   < > 93* 95* 98  CO2 23   < > 25 25 24   GLUCOSE 203*   < > 83 156* 136*  BUN 31*   < > 67* 32* 45*  CREATININE 2.42*   < > 5.93* 3.73* 4.92*  CALCIUM 8.4*   < > 9.4 8.2* 8.8*  PHOS 2.8  --   --   --   --    < > = values in this interval not displayed.   Liver Function Tests: Recent Labs  Lab 04/02/18 2043 04/04/18 0239  AST 196* 45*  ALT 128* 53*   ALKPHOS 91 58  BILITOT 0.7 0.6  PROT 6.7 5.3*  ALBUMIN 3.1* 2.2*   No results for input(s): LIPASE, AMYLASE in the last 168 hours. No results for input(s): AMMONIA in the last 168 hours. CBC: Recent Labs  Lab 04/02/18 2043  04/05/18 0229 04/06/18 0232 04/07/18 0535 04/08/18 0414 04/09/18 0319  WBC 18.9*   < > 11.0* 10.3 9.1 8.9 10.1  NEUTROABS 15.1*  --   --   --   --   --   --   HGB 12.2   < > 8.5* 8.8* 9.4* 8.8* 9.4*  HCT 40.4   < > 27.6* 28.1* 29.3* 28.3* 30.1*  MCV 111.6*   < > 109.1* 107.7* 106.2* 107.6* 106.4*  PLT 204   < > 143* 155 166 156 174   < > = values in this interval not displayed.   Cardiac Enzymes: Recent Labs  Lab 04/03/18 0054 04/03/18 0605  TROPONINI 1.17* 1.14*   CBG: Recent Labs  Lab 04/08/18 0749 04/08/18 1112 04/08/18 1710 04/08/18  2143 04/09/18 0601  GLUCAP 132* 143* 120* 108* 102*    Iron Studies:  Recent Labs    04/07/18 0535  IRON 34  TIBC 220*  FERRITIN 1,432*   Studies/Results: No results found.  Medications: Infusions: . sodium chloride 10 mL/hr at 04/06/18 2355  . sodium chloride    . sodium chloride    . sodium chloride    . sodium chloride    . ferric gluconate (FERRLECIT/NULECIT) IV 125 mg (04/07/18 1945)    Scheduled Medications: . aspirin  81 mg Oral Daily  . atorvastatin  40 mg Oral q1800  . B-complex with vitamin C  1 tablet Per Tube Daily  . brimonidine  1 drop Left Eye TID  . Chlorhexidine Gluconate Cloth  6 each Topical Q0600  . Chlorhexidine Gluconate Cloth  6 each Topical Q0600  . feeding supplement  1 Container Oral TID BM  . heparin  5,000 Units Subcutaneous Q8H  . hydrALAZINE  25 mg Oral Q8H  . insulin aspart  2-6 Units Subcutaneous TID WC  . insulin glargine  15 Units Subcutaneous Daily  . latanoprost  1 drop Both Eyes QHS  . mouth rinse  15 mL Mouth Rinse BID  . metoprolol tartrate  50 mg Oral BID  . sodium chloride flush  3 mL Intravenous Q12H  . ticagrelor  90 mg Oral BID  . timolol   1 drop Left Eye Daily  . vancomycin  125 mg Oral QID    have reviewed scheduled and prn medications.  Physical Exam: General: Lying in bed comfortable, not in distress Heart:RRR, s1s2 nl Lungs: Clear bilateral, no wheezing Abdomen:soft, Non-tender, non-distended Extremities:no edema.  Dialysis Access: Left upper extremity AV graft has good bruit.  Dron Prasad Bhandari 04/09/2018,9:32 AM  LOS: 7 days

## 2018-04-09 NOTE — PMR Pre-admission (Addendum)
PMR Admission Coordinator Pre-Admission Assessment  Patient: [name] is an 77 y.o., female MRN: [id number] DOB: 10/08/1940 Height: 5\' 4"  (162.6 cm) Weight: 72.8 kg              Insurance Information HMO:     PPO:      PCP:      IPA:      80/20:      OTHER: no HMO PRIMARY: Medicare a and b      Policy#: [id number]      Subscriber: pt Benefits:  Phone #: passport one online     Name: 04/09/2018 Eff. Date: 11/12/2004     Deduct: $1364      Out of Pocket Max: none      Life Max: none CIR: 100%      SNF: 20 full days Outpatient: 80%     Co-Pay: 20% Home Health: 100%      Co-Pay: none DME: 80%     Co-Pay: 20% Providers: pt choice  SECONDARY: [employer] [place] supplement      Policy#: [id number]      Subscriber: pt  Medicaid Application Date:       Case Manager:  Disability Application Date:       Case Worker:   Emergency Contact Information Contact Information    Name Relation Home Work [place]  [phone]   Magda Paganini Daughter [phone]       Current Medical History  Patient Admitting Diagnosis: debility, 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 [name] is a 77 year old right-handed female history of irritable bowel syndrome, end-stage renal disease with hemodialysis recently initiated approximately 3 to 4 weeks ago, atrial fibrillation, CAD with CABG 2013 maintained on aspirin, left carotid enterectomy, hypertension, diabetes mellitus as well as history of lumbar radiculopathy with L5-S1 microdiscectomy receiving inpatient rehab services May 2017.    Presented 04/02/2018 after witnessed out of hospital cardiac arrest.  CPR initiated.  EMS arrived within 10 minutes found in V. fib and intubated in the field.  Noted systolic blood pressure in the 220s.  Troponin 1.17, lactic acid 5.11, WBC 18,900.  Placed on heparin drip.  Echocardiogram with ejection fraction of 50% akinesis of the basal mid  inferior lateral and inferior myocardium grade 1 diastolic dysfunction.  Placed on broad-spectrum antibiotics for elevated WBC question aspiration pneumonia.  Cardiology follow-up cardiac catheterization 04/08/2018 that showed proximal LCx lesion 100% stenosis in proximal LAD 95% stenosis.  Patient underwent successful stenting per Dr.Varanasi and maintained on aspirin and Brilinta therapy.  Hemodialysis ongoing as per renal services.  Hospital course positive C. difficile with Flexi-Seal placed contact precautions completing course of vancomycin for C. difficile.  Subcutaneous heparin for DVT prophylaxis.  Patient did remain intubated through 04/04/2018.  Patient with episode of bradycardia 04/10/2018 and remained asymptomatic with follow-up per cardiology services with EKG showing P-P prolongation noted while coughing and also episode while sleeping beta-blocker was discontinued.   Bistolic discontinued today 10/2 due to continued pauses by cardiology service. Patient has been made a DNR by Nephrology for she was DNR at outpatient dialysis v=center when began dialysis.  Past Medical History  Past Medical History:  Diagnosis Date  . Anemia   . Arthritis   . Atrial fibrillation ([place])   . Blood transfusion   . CAD (coronary artery disease)   . Cancer ([place])    .  top of head- melonoma  . Carotid artery occlusion  Carotid Endartectom,y - left 2009.  Blockage Right being watched by Dr Scot Dock.  . Carotid stenosis   . 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 Complication of anesthesia    pt. states that she was difficult to wake  . Depression   . Diabetes mellitus without complication ([place])   . Dysrhythmia   . General weakness 12/2015  . GERD (gastroesophageal reflux disease)   . Glaucoma   . History of hiatal hernia   . History of kidney stones    passed  . History of pneumonia   . HOH (hard of hearing)   . Hypertension   . Hypokalemia 12/2015  . Myocardial infarction ([place])   .  Neuromuscular disorder ([place])    CARPEL TUNNEL  . Pneumonia   . Shortness of breath   . Stroke ([place])    hx of TIA   Family History  family history includes Cancer in her father; Deep vein thrombosis in her daughter; Diabetes in her daughter and mother; Heart attack in her mother; Heart disease in her daughter and mother; Hyperlipidemia in her daughter and father; Hypertension in her daughter, father, and mother; Peripheral vascular disease in her daughter; Stroke in her mother.  Prior Rehab/Hospitalizations:  Has the patient had major surgery during 100 days prior to admission? No  Previous CIR 11/2015  With Dr. Charm Barges team after back surgery for 11 days and discharged home with [facility].  Current Medications   Current Facility-Administered Medications:  .  acetaminophen (TYLENOL) tablet 650 mg, 650 mg, Oral, Q6H PRN, Jettie Booze, MD, 650 mg at 04/15/18 1048 .  alum & mag hydroxide-simeth (MAALOX/MYLANTA) 200-200-20 MG/5ML suspension 15 mL, 15 mL, Oral, Q6H PRN, Larae Grooms S, MD .  aspirin chewable tablet 81 mg, 81 mg, Oral, Daily, Jettie Booze, MD, 81 mg at 04/15/18 1032 .  atorvastatin (LIPITOR) tablet 40 mg, 40 mg, Oral, q1800, Jettie Booze, MD, 40 mg at 04/14/18 1727 .  B-complex with vitamin C tablet 1 tablet, 1 tablet, Per Tube, Daily, Jettie Booze, MD, 1 tablet at 04/15/18 1032 .  bisacodyl (DULCOLAX) suppository 10 mg, 10 mg, Rectal, Daily PRN, Larae Grooms S, MD .  brimonidine (ALPHAGAN) 0.15 % ophthalmic solution 1 drop, 1 drop, Left Eye, TID, Jettie Booze, MD, 1 drop at 04/15/18 1040 .  calcium carbonate (TUMS - dosed in mg elemental calcium) chewable tablet 200 mg of elemental calcium, 1 tablet, Oral, Q6H PRN, Jettie Booze, MD, 200 mg of elemental calcium at 04/13/18 2217 .  Chlorhexidine Gluconate Cloth 2 % PADS 6 each, 6 each, Topical, Q0600, Roney Jaffe, MD, 6 each at 04/15/18 [phone] .  Darbepoetin Alfa (ARANESP)  injection 100 mcg, 100 mcg, Intravenous, Q Thu-HD, Rosita Fire, MD, 100 mcg at 04/09/18 1316 .  docusate (COLACE) 50 MG/5ML liquid 100 mg, 100 mg, Per Tube, BID PRN, Jettie Booze, MD .  feeding supplement (NEPRO CARB STEADY) liquid 237 mL, 237 mL, Oral, BID BM, Pokhrel, Laxman, MD .  ferric gluconate (NULECIT) 125 mg in sodium chloride 0.9 % 100 mL IVPB, 125 mg, Intravenous, Q Tue-HD, Jettie Booze, MD, Stopped at 04/14/18 1209 .  Gerhardt's butt cream, , Topical, PRN, Jettie Booze, MD .  guaiFENesin-dextromethorphan [facility] DM) 100-10 MG/5ML syrup 5 mL, 5 mL, Oral, Q4H PRN, Roney Jaffe, MD, 5 mL at 04/14/18 2258 .  heparin injection 5,000 Units, 5,000 Units, Subcutaneous, Q8H, Jettie Booze, MD, 5,000 Units at  04/15/18 6144 .  hydrALAZINE (APRESOLINE) tablet 25 mg, 25 mg, Oral, Q8H, Rosita Fire, MD, 25 mg at 04/15/18 0651 .  insulin aspart (novoLOG) injection 2-6 Units, 2-6 Units, Subcutaneous, TID WC, Roney Jaffe, MD, 2 Units at 04/14/18 1726 .  insulin glargine (LANTUS) injection 15 Units, 15 Units, Subcutaneous, Daily, Jettie Booze, MD, 15 Units at 04/15/18 1039 .  latanoprost (XALATAN) 0.005 % ophthalmic solution 1 drop, 1 drop, Both Eyes, QHS, Jettie Booze, MD, 1 drop at 04/14/18 2258 .  MEDLINE mouth rinse, 15 mL, Mouth Rinse, BID, Jettie Booze, MD, 15 mL at 04/12/18 2215 .  ondansetron (ZOFRAN) injection 4 mg, 4 mg, Intravenous, Q6H PRN, Jettie Booze, MD .  ticagrelor [facility]) tablet 90 mg, 90 mg, Oral, BID, Jettie Booze, MD, 90 mg at 04/15/18 1032 .  timolol (TIMOPTIC) 0.5 % ophthalmic solution 1 drop, 1 drop, Left Eye, Daily, Jettie Booze, MD, 1 drop at 04/14/18 2258  Patients Current Diet:  Diet Order            Diet Carb Modified Fluid consistency: Thin; Room service appropriate? Yes  Diet effective now              Precautions / Restrictions Precautions Precautions:  Fall Precaution Comments: rectal tube Restrictions Weight Bearing Restrictions: No   Has the patient had 2 or more falls or a fall with injury in the past year?Yes  Prior Activity Level Community (5-7x/wk): very active and independent until 2 months ago. Recent start to hemodialyis.   Home Assistive Devices / Equipment Home Assistive Devices/Equipment: None Home Equipment: Environmental consultant - 2 wheels, [place] - single point, Bedside commode, Shower seat, Grab bars - toilet  Prior Device Use: Indicate devices/aids used by the patient prior to current illness, exacerbation or injury? Walker and cane as needed  Prior Functional Level Prior Function Level of Independence: (Mod I with cane and RW as needed) Comments: Uses rolling walker or cane. Frequent falls. Shares the housework with her husband.   Decline in function over past 2 months as renal function declined. Began Hemodialysis 3 to 4 weeks ago. Prior to that she was mowing lawn, Independent adls, cooking, etc.   Self Care: Did the patient need help bathing, dressing, using the toilet or eating?  Independent  Indoor Mobility: Did the patient need assistance with walking from room to room (with or without device)? Independent  Stairs: Did the patient need assistance with internal or external stairs (with or without device)? Independent  Functional Cognition: Did the patient need help planning regular tasks such as shopping or remembering to take medications? Independent  Current Functional Level Cognition  Overall Cognitive Status: Within Functional Limits for tasks assessed Orientation Level: Oriented X4 General Comments: pt requires increased time for movement. self-limiting by pain.     Extremity Assessment (includes Sensation/Coordination)  Upper Extremity Assessment: Generalized weakness  Lower Extremity Assessment: Defer to PT evaluation    ADLs  Overall ADL's : Needs assistance/impaired Eating/Feeding: Set up, Supervision/  safety, Sitting Grooming: Oral care, Brushing hair, Supervision/safety, Set up, Sitting Grooming Details (indicate cue type and reason): Sitting in recliner, pt performing oral care and brushed her hair. Supervision and Min VCs for encouragement Upper Body Bathing: Minimal assistance, Sitting Lower Body Bathing: Maximal assistance, Sit to/from stand Lower Body Bathing Details (indicate cue type and reason): Decreased ROM to bend forward due to chest pain Upper Body Dressing : Minimal assistance, Sitting Lower Body Dressing: Maximal assistance, Sit to/from  stand Toilet Transfer: Minimal assistance, +2 for physical assistance, Ambulation, RW(Simulated to recliner) Toilet Transfer Details (indicate cue type and reason): Pt requiring Min A to power up into standing.  Functional mobility during ADLs: Minimal assistance, +2 for physical assistance, Rolling walker General ADL Comments: Pt presenting with decreased balance, strength, ROM, and activity tolerance.    Mobility  Overal bed mobility: Needs Assistance Bed Mobility: Rolling, Sidelying to Sit Rolling: Modified independent (Device/Increase time) Sidelying to sit: Min assist Supine to sit: Mod assist, +2 for physical assistance General bed mobility comments: assist to scoot hips to edge of bed with use of pad. 1 hha to rise for side to sit.     Transfers  Overall transfer level: Needs assistance Equipment used: Rolling walker (2 wheeled) Transfers: Sit to/from Stand Sit to Stand: Min assist Stand pivot transfers: Min guard General transfer comment: cues for hand placement on bed and to reach back for chair. three standing trials, guard to rise, min assist while standing for quick fatigue, prolonged standing to get cleaned up. min guard for pivot to chair     Ambulation / Gait / Stairs / Wheelchair Mobility  Ambulation/Gait Ambulation/Gait assistance: Mod assist, +2 physical assistance Gait Distance (Feet): 2 Feet Assistive device: 2  person hand held assist Gait Pattern/deviations: Step-to pattern, Decreased stride length General Gait Details: side steps up to [facility] with fatigue and weakness with bilateral HHA Gait velocity: decr Gait velocity interpretation: <1.8 ft/sec, indicate of risk for recurrent falls    Posture / Balance Dynamic Sitting Balance Sitting balance - Comments: UE support Balance Overall balance assessment: Needs assistance Sitting-balance support: Feet supported Sitting balance-Leahy Scale: Fair Sitting balance - Comments: UE support Standing balance support: Bilateral upper extremity supported Standing balance-Leahy Scale: Poor Standing balance comment: bilateral UE support for standing    Special needs/care consideration BiPAP/CPAP n/a CPM n/a Continuous Drip IV n/a Dialysis ESRD on hemodialysis Tues, Thurs, Sat at [facility]. Family provides transport Armed forces training and education officer n/a Oxygen n/a Special Bed n/a Trach Size n/a Wound Vac n/a Skin tear to medial nose; left arm with skin tear; moisture associated skin damage to buttocks; left arm skin tear; HD access  LUE Bowel mgmt: cdiff diarrhea with flexiseal removed 04/14/18; pt with IBS Bladder mgmt: external catheter Diabetic mgmt yes Enteric precautions due to c diff diagnosis HOH Made DNR by nephrology for she was DNR at outpatient dialysis   Previous Home Environment Living Arrangements: Spouse/significant other  Lives With: Spouse Available Help at Discharge: Family, Available 24 hours/day Type of Home: House Home Layout: Two level, Able to live on main level with bedroom/bathroom Alternate Level Stairs-Rails: Right Alternate Level Stairs-Number of Steps: 10 steps to upper level Home Access: Level entry Bathroom Shower/Tub: Multimedia programmer: Handicapped height Bathroom Accessibility: Yes Home Care Services: No Additional Comments: Daughter lives in house behind pt  Discharge Living Setting Plans for Discharge Living Setting:  Patient's home, Lives with (comment)(spouse) Type of Home at Discharge: House Discharge Home Layout: Two level, Able to live on main level with bedroom/bathroom, Laundry or work area in basement Alternate Level Stairs-Rails: Right Alternate Level Stairs-Number of Steps: 10 steps to upper level Discharge Home Access: Level entry Discharge Bathroom Shower/Tub: Walk-in shower Discharge Bathroom Toilet: Handicapped height Discharge Bathroom Accessibility: Yes How Accessible: Accessible via walker Does the patient have any problems obtaining your medications?: No  Social/Family/Support Systems Patient Roles: Spouse, Parent Contact Information: spouse , Jeneen Rinks and daughter, Mimmi Anticipated Caregiver: sposue, daughter and grand daughters  Anticipated Caregiver's Contact Information: see above Ability/Limitations of Caregiver: spouse has a bad hip, daughter lives in home behind her parents Caregiver Availability: 24/7 Discharge Plan Discussed with Primary Caregiver: Yes Is Caregiver In Agreement with Plan?: Yes Does Caregiver/Family have Issues with Lodging/Transportation while Pt is in Rehab?: No  Goals/Additional Needs Patient/Family Goal for Rehab: Mod I to supervision with PT, OT, and SLP Expected length of stay: [facility] 7 to 10 days Special Service Needs: New to hemodaioysis for 3 weeks. HD Tues,Thurs, and Sat Additional Information: spouse and daughter transport patient to hemodialysis at [place]  Pt/Family Agrees to Admission and willing to participate: Yes Program Orientation Provided & Reviewed with Pt/Caregiver Including Roles  & Responsibilities: Yes  Decrease burden of Care through IP rehab admission: n/a  Possible need for SNF placement upon discharge: not anticipated  Patient Condition: This patient's medical and functional status has changed since the consult dated: 04/09/2018 in which the Rehabilitation Physician determined and documented that the patient's condition is  appropriate for intensive rehabilitative care in an inpatient rehabilitation facility. See "History of Present Illness" (above) for medical update. Functional changes are: overall mod asisst. Patient's medical and functional status update has been discussed with the Rehabilitation physician and patient remains appropriate for inpatient rehabilitation. Will admit to inpatient rehab today.  Preadmission Screen Completed By:  Cleatrice Burke, 04/15/2018 12:38 PM ______________________________________________________________________   Discussed status with Dr. Posey Pronto on 04/15/2018 at  1237 and received telephone approval for admission today.  Admission Coordinator:  Cleatrice Burke, time 3810 Date 04/15/2018

## 2018-04-09 NOTE — Discharge Instructions (Signed)

## 2018-04-09 NOTE — Consult Note (Signed)
Physical Medicine and Rehabilitation Consult Reason for Consult: Decreased functional mobility Referring Physician: [facility]   HPI: [name] is a 77 y.o. right-handed female with history of end-stage renal disease, atrial fibrillation, CAD with CABG 2013 maintained on aspirin, left carotid enterectomy, hypertension, diabetes mellitus.  Per chart review patient lives with spouse.  Independent with assistive device prior to admission.  Daughter lives next door.  Two-level home with bed and bath on main level.  Presented 04/02/2018 after witnessed out of hospital cardiac arrest.  CPR was initiated.  EMS arrived within 10 minutes found patient in V. fib and was intubated in the field.  Noted systolic blood pressure in the 220s.  Troponin 1.17, lactic acid 5.11, WBC 18,900.  Placed on heparin drip.  Echocardiogram with ejection fraction of 50%.  Akinesis of the basal mid inferior lateral and inferior myocardium grade 1 diastolic dysfunction.  Placed on broad-spectrum antibiotics for elevated WBC question aspiration pneumonia.  Cardiology follow-up plan for cardiac catheterization.  Hemodialysis ongoing for ESRD.  Hospital course positive C. difficile with Flexi-Seal in place and contact precautions initiated.  Patient remained intubated until 04/04/2018.  Therapy evaluations completed with recommendations of physical medicine rehab consult.   Review of Systems  Constitutional: Negative for chills and fever.  HENT: Positive for hearing loss.   Eyes: Negative for blurred vision and double vision.  Respiratory: Positive for shortness of breath. Negative for cough.   Cardiovascular: Positive for chest pain, palpitations and leg swelling.  Gastrointestinal: Positive for diarrhea. Negative for nausea and vomiting.       GERD  Genitourinary: Negative for dysuria, flank pain and hematuria.  Musculoskeletal: Positive for myalgias.  Skin: Negative for rash.  Neurological: Positive for weakness.    Psychiatric/Behavioral: Positive for depression.  All other systems reviewed and are negative.  Past Medical History:  Diagnosis Date  . Anemia   . Arthritis   . Atrial fibrillation ([place])   . Blood transfusion   . CAD (coronary artery disease)   . Cancer ([place])    .  top of head- melonoma  . Carotid artery occlusion    Carotid Endartectom,y - left 2009.  Blockage Right being watched by Dr Scot Dock.  . Carotid stenosis   . 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 Complication of anesthesia    pt. states that she was difficult to wake  . Depression   . Diabetes mellitus without complication ([place])   . Dysrhythmia   . General weakness 12/2015  . GERD (gastroesophageal reflux disease)   . Glaucoma   . History of hiatal hernia   . History of kidney stones    passed  . History of pneumonia   . HOH (hard of hearing)   . Hypertension   . Hypokalemia 12/2015  . Myocardial infarction ([place])   . Neuromuscular disorder ([place])    CARPEL TUNNEL  . Pneumonia   . Shortness of breath   . Stroke ([place])    hx of TIA   Past Surgical History:  Procedure Laterality Date  . A/V FISTULAGRAM N/A 07/21/2017   Procedure: A/V FISTULAGRAM - Left Arm;  Surgeon: Angelia Mould, MD;  Location: [facility];  Service: Cardiovascular;  Laterality: N/A;  . AV FISTULA PLACEMENT Left 05/31/2014   Procedure: Creation of Left Arm arteriovenous brachiocephalic Fistula;  Surgeon: Angelia Mould, MD;  Location: [facility];  Service: Vascular;  Laterality: Left;  . AV FISTULA PLACEMENT  Left 09/01/2014   Procedure: INSERTION OF ARTERIOVENOUS (AV) GORE-TEX GRAFT LEFT UPPER ARM USING  4-7 MM X 45 CM SRTETCH GORETEX GRAFT;  Surgeon: Angelia Mould, MD;  Location: [place];  Service: Vascular;  Laterality: Left;  . CARDIAC CATHETERIZATION    . CAROTID ENDARTERECTOMY Left 2009    CEA  . carpel tunnel    . COLONOSCOPY  07/25/2011   Procedure: COLONOSCOPY;  Surgeon: Winfield Cunas., MD;   [facility];  Service: Endoscopy;  Laterality: N/A;  . CORONARY ARTERY BYPASS GRAFT  07/29/2011   Procedure: CORONARY ARTERY BYPASS GRAFTING (CABG);  Surgeon: Gaye Pollack, MD;  Location: [place];  Service: Open Heart Surgery;  Laterality: N/A;  . ENDARTERECTOMY Right 04/21/2015   Procedure: ENDARTERECTOMY CAROTID;  Surgeon: Angelia Mould, MD;  Location: [place];  Service: Vascular;  Laterality: Right;  . ESOPHAGOGASTRODUODENOSCOPY  07/25/2011   Procedure: ESOPHAGOGASTRODUODENOSCOPY (EGD);  Surgeon: Winfield Cunas., MD;  Location: [facility] ENDOSCOPY;  Service: Endoscopy;  Laterality: N/A;  . EYE SURGERY    . FISTULOGRAM Left 08/29/2014   Procedure: FISTULOGRAM;  Surgeon: Angelia Mould, MD;  Location[facility] LAB;  Service: Cardiovascular;  Laterality: Left;  . KNEE SURGERY Left   . LUMBAR LAMINECTOMY/DECOMPRESSION MICRODISCECTOMY N/A 12/01/2015   Procedure: L5-S1 Decompression and Bilateral Microdiscectomy;  Surgeon: Marybelle Killings, MD;  Location: [place];  Service: Orthopedics;  Laterality: N/A;  . NECK SURGERY    . PERIPHERAL VASCULAR BALLOON ANGIOPLASTY  07/21/2017   Procedure: PERIPHERAL VASCULAR BALLOON ANGIOPLASTY;  Surgeon: Angelia Mould, MD;  Location: [place] [facility];  Service: Cardiovascular;;  LT Arm AVF  . TONSILLECTOMY     Family History  Problem Relation Age of Onset  . Diabetes Mother   . Hypertension Mother   . Heart disease Mother        beofre age 26  . Heart attack Mother   . Stroke Mother   . Cancer Father   . Hyperlipidemia Father   . Hypertension Father   . Deep vein thrombosis Daughter   . Diabetes Daughter   . Hyperlipidemia Daughter   . Hypertension Daughter   . Heart disease Daughter   . Peripheral vascular disease Daughter   . Breast cancer Neg Hx    Social History:  reports that she has never smoked. She has never used smokeless tobacco. She reports that she does not drink alcohol or use drugs. Allergies:  Allergies  Allergen  Reactions  . Gabapentin Other (See Comments)    Hallucinations and "Makes me go crazy"   . Sulfa Antibiotics Other (See Comments)    Altered mental state   . Amlodipine Other (See Comments)    "Makes my legs swell"; edema   Medications Prior to Admission  Medication Sig Dispense Refill  . acetaminophen (TYLENOL) 325 MG tablet Take 2 tablets (650 mg total) by mouth every 6 (six) hours as needed for mild pain.    Marland Kitchen allopurinol (ZYLOPRIM) 100 MG tablet Take 200 mg by mouth daily.     Marland Kitchen ALPRAZolam (XANAX) 0.25 MG tablet Take 0.125 mg by mouth at bedtime.   0  . aspirin EC 81 MG tablet Take 1 tablet (81 mg total) by mouth daily. 90 tablet 3  . atorvastatin (LIPITOR) 10 MG tablet TAKE ONE TABLET BY MOUTH ONE TIME DAILY 30 tablet 0  . b complex-C-folic acid 1 MG capsule Take 1 capsule by mouth daily.    . brimonidine (ALPHAGAN P)  0.1 % SOLN Place 1 drop into the left eye 2 (two) times daily.    . Cholecalciferol (VITAMIN D) 2000 UNITS tablet Take 2,000 Units by mouth 2 (two) times daily.    . citalopram (CELEXA) 20 MG tablet Take 20 mg by mouth daily.    . colchicine 0.6 MG tablet Take 0.6 mg by mouth 2 (two) times daily as needed (for gout flares).     . Cyanocobalamin (B-12) 5000 MCG CAPS Take 5,000 mcg by mouth daily.    . insulin glargine (LANTUS) 100 UNIT/ML injection Inject 0.2-0.3 mLs (20-30 Units total) into the skin at bedtime. (Patient taking differently: Inject 10-30 Units into the skin See admin instructions. Inject 10-30 units into the skin at bedtime, per sliding scale, and may inject an additional 10 units in the morning as needed for high blood sugar) 10 mL 11  . Menthol-Methyl Salicylate (MUSCLE RUB) 10-15 % CREA Apply 1 application topically 2 (two) times daily. (Patient taking differently: Apply 1 application topically as needed for muscle pain. )  0  . metoprolol tartrate (LOPRESSOR) 25 MG tablet Take 1 tablet (25 mg total) by mouth 2 (two) times daily. (Patient taking  differently: Take 25 mg by mouth every morning. ) 180 tablet 2  . nitroGLYCERIN (NITROSTAT) 0.4 MG SL tablet Place 0.4 mg under the tongue every 5 (five) minutes as needed for chest pain.     . Omega-3 Fatty Acids (FISH OIL) 1200 MG CAPS Take 1,200 mg by mouth 2 (two) times daily.    Marland Kitchen omeprazole (PRILOSEC) 20 MG capsule Take 20 mg by mouth daily as needed (acid reflux).     Marland Kitchen PROCTOSOL HC 2.5 % rectal cream Place 1 application rectally 2 (two) times daily as needed for hemorrhoids.   1  . sitaGLIPtin (JANUVIA) 25 MG tablet Take 25 mg by mouth daily.     . timolol (TIMOPTIC) 0.5 % ophthalmic solution Place 1 drop into the left eye daily.    . travoprost, benzalkonium, (TRAVATAN) 0.004 % ophthalmic solution Place 1 drop into both eyes at bedtime.     . isosorbide mononitrate (IMDUR) 30 MG 24 hr tablet Take 1 tablet (30 mg total) by mouth daily. (Patient not taking: Reported on 04/02/2018) 30 tablet 0  . prazosin (MINIPRESS) 1 MG capsule TAKE 1 CAPSULE (1 MG TOTAL) BY MOUTH AT BEDTIME. (Patient not taking: Reported on 04/02/2018) 30 capsule 1    Home: [place] expects to be discharged to:: Private residence Living Arrangements: Spouse/significant other Available Help at Discharge: Family, Available 24 hours/day Type of Home: House Home Access: Level entry Home Layout: Two level, Able to live on main level with bedroom/bathroom Bathroom Shower/Tub: Multimedia programmer: Handicapped height Bathroom Accessibility: Yes Home Equipment: Environmental consultant - 2 wheels, [place] - single point, Bedside commode, Shower seat, Grab bars - toilet Additional Comments: Daughter lives in house behind pt  Functional History: Prior Function Level of Independence: Independent with assistive device(s) Comments: Uses rolling walker or cane. Frequent falls. Shares the housework with her husband. Functional Status:  Mobility: Bed Mobility Overal bed mobility: Needs Assistance Bed Mobility: Rolling,  Sidelying to Sit Rolling: Min assist Sidelying to sit: Min assist Supine to sit: Min assist, +2 for safety/equipment, HOB elevated General bed mobility comments: Assist to move legs off of bed, elevate trunk into sitting, and bring hips to EOB Transfers Overall transfer level: Needs assistance Equipment used: Rolling walker (2 wheeled) Transfers: Sit to/from Stand Sit to Stand: +2 physical  assistance, Min assist Stand pivot transfers: +2 physical assistance, Min assist General transfer comment: Assist to bring hips up and for balance. Verbal cues for hand placement Ambulation/Gait Ambulation/Gait assistance: Min assist Gait Distance (Feet): 10 Feet(10' x 1, 5' x1) Assistive device: Rolling walker (2 wheeled) Gait Pattern/deviations: Step-through pattern, Decreased step length - right, Decreased step length - left, Trunk flexed General Gait Details: Assist for balance and support.  Gait velocity: decr Gait velocity interpretation: <1.8 ft/sec, indicate of risk for recurrent falls    ADL: ADL Overall ADL's : Needs assistance/impaired Eating/Feeding: Set up, Supervision/ safety, Sitting Grooming: Oral care, Brushing hair, Supervision/safety, Set up, Sitting Grooming Details (indicate cue type and reason): Sitting in recliner, pt performing oral care and brushed her hair. Supervision and Min VCs for encouragement Upper Body Bathing: Minimal assistance, Sitting Lower Body Bathing: Maximal assistance, Sit to/from stand Lower Body Bathing Details (indicate cue type and reason): Decreased ROM to bend forward due to chest pain Upper Body Dressing : Minimal assistance, Sitting Lower Body Dressing: Maximal assistance, Sit to/from stand Toilet Transfer: Minimal assistance, +2 for physical assistance, Ambulation, RW(Simulated to recliner) Toilet Transfer Details (indicate cue type and reason): Pt requiring Min A to power up into standing.  Functional mobility during ADLs: Minimal assistance, +2  for physical assistance, Rolling walker General ADL Comments: Pt presenting with decreased balance, strength, ROM, and activity tolerance.  Cognition: Cognition Overall Cognitive Status: Impaired/Different from baseline Orientation Level: Oriented to person, Oriented to place, Disoriented to time(forgetful of situation) Cognition Arousal/Alertness: Awake/alert Behavior During Therapy: WFL for tasks assessed/performed Overall Cognitive Status: Impaired/Different from baseline Area of Impairment: Memory Memory: Decreased short-term memory Awareness: Emergent General Comments: Pt requiring increased time and cues during session. Pt presenting with decreased processing and memory  Blood pressure (!) 179/61, pulse 78, temperature 97.8 F (36.6 C), temperature source Oral, resp. rate (!) 23, height 5\' 4"  (1.626 m), weight 59.1 kg, SpO2 95 %. Physical Exam  Constitutional: No distress.  Frail appearing  HENT:  Head: Normocephalic and atraumatic.  Neck: Normal range of motion.  Cardiovascular: Normal rate.  Respiratory: Effort normal. No respiratory distress.  GI: Soft.  Genitourinary:  Genitourinary Comments: Flexi-Seal in place  Musculoskeletal: She exhibits no edema.  Moves arms and legs freely without pain  Neurological: She is alert.  Resting comfortably.  Alert and oriented.  Follows simple commands. Decreased attention. Fair insight. UE 4/5 prox to distal. LE: 3/5 HF, KE and 4/5 ADF/PF. Senses pain in all 4.  Skin: Skin is warm.  Psychiatric: She has a normal mood and affect. Her behavior is normal.    Results for orders placed or performed during the hospital encounter of 04/02/18 (from the past 24 hour(s))  Glucose, capillary     Status: Abnormal   Collection Time: 04/08/18  7:49 AM  Result Value Ref Range   Glucose-Capillary 132 (H) 70 - 99 mg/dL  Glucose, capillary     Status: Abnormal   Collection Time: 04/08/18 11:12 AM  Result Value Ref Range   Glucose-Capillary 143  (H) 70 - 99 mg/dL  POCT Activated clotting time     Status: None   Collection Time: 04/08/18  3:28 PM  Result Value Ref Range   Activated Clotting Time 274 seconds  Glucose, capillary     Status: Abnormal   Collection Time: 04/08/18  5:10 PM  Result Value Ref Range   Glucose-Capillary 120 (H) 70 - 99 mg/dL   Comment 1 Notify RN    Comment  2 Document in Chart   POCT Activated clotting time     Status: None   Collection Time: 04/08/18  7:57 PM  Result Value Ref Range   Activated Clotting Time 158 seconds  Glucose, capillary     Status: Abnormal   Collection Time: 04/08/18  9:43 PM  Result Value Ref Range   Glucose-Capillary 108 (H) 70 - 99 mg/dL   Comment 1 Notify RN    Comment 2 Document in Chart   Basic metabolic panel     Status: Abnormal   Collection Time: 04/09/18  3:19 AM  Result Value Ref Range   Sodium 136 135 - 145 mmol/L   Potassium 3.6 3.5 - 5.1 mmol/L   Chloride 98 98 - 111 mmol/L   CO2 24 22 - 32 mmol/L   Glucose, Bld 136 (H) 70 - 99 mg/dL   BUN 45 (H) 8 - 23 mg/dL   Creatinine, Ser 4.92 (H) 0.44 - 1.00 mg/dL   Calcium 8.8 (L) 8.9 - 10.3 mg/dL   GFR calc non Af Amer 8 (L) >60 mL/min   GFR calc Af Amer 9 (L) >60 mL/min   Anion gap 14 5 - 15  CBC     Status: Abnormal   Collection Time: 04/09/18  3:19 AM  Result Value Ref Range   WBC 10.1 4.0 - 10.5 K/uL   RBC 2.83 (L) 3.87 - 5.11 MIL/uL   Hemoglobin 9.4 (L) 12.0 - 15.0 g/dL   HCT 30.1 (L) 36.0 - 46.0 %   MCV 106.4 (H) 78.0 - 100.0 fL   MCH 33.2 26.0 - 34.0 pg   MCHC 31.2 30.0 - 36.0 g/dL   RDW 14.6 11.5 - 15.5 %   Platelets 174 150 - 400 K/uL  Glucose, capillary     Status: Abnormal   Collection Time: 04/09/18  6:01 AM  Result Value Ref Range   Glucose-Capillary 102 (H) 70 - 99 mg/dL   Comment 1 Notify RN    Comment 2 Document in Chart    No results found.   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 Does the need for close, 24 hr/day medical  supervision in concert with the patient's rehab needs make it unreasonable for this patient to be served in a less intensive setting? Yes 2. Co-Morbidities requiring supervision/potential complications: ESRD on HD, C diff, afib,  3. Due to bladder management, bowel management, safety, skin/wound care, disease management, medication administration and patient education, does the patient require 24 hr/day rehab nursing? Yes 4. Does the patient require coordinated care of a physician, rehab nurse, PT (1-2 hrs/day, 5 days/week), OT (1-2 hrs/day, 5 days/week) and SLP (1-2 hrs/day, 5 days/week) to address physical and functional deficits in the context of the above medical diagnosis(es)? Yes Addressing deficits in the following areas: balance, endurance, locomotion, strength, transferring, bowel/bladder control, bathing, dressing, feeding, grooming, toileting, cognition and psychosocial support, stamina. 5. Can the patient actively participate in an intensive therapy program of at least 3 hrs of therapy per day at least 5 days per week? Yes 6. The potential for patient to make measurable gains while on inpatient rehab is excellent 7. Anticipated functional outcomes upon discharge from inpatient rehab are modified independent and supervision  with PT, modified independent and supervision with OT, modified independent with SLP. 8. Estimated rehab length of stay to reach the above functional goals is: 7-10 days 9. Anticipated D/C setting: Home 10. Anticipated post D/C treatments: Veteran therapy 11. Overall Rehab/Functional Prognosis:  excellent  RECOMMENDATIONS: This patient's condition is appropriate for continued rehabilitative care in the following setting: CIR Patient has agreed to participate in recommended program. Yes Note that insurance prior authorization may be required for reimbursement for recommended care.  Comment: Rehab Admissions Coordinator to follow up.  Thanks,  Meredith Staggers, MD,  Mellody Drown  I have personally performed a face to face diagnostic evaluation of this patient. Additionally, I have reviewed and concur with the physician assistant's documentation above.    Lavon Paganini Angiulli, PA-C 04/09/2018

## 2018-04-09 NOTE — Procedures (Signed)
Patient was seen on dialysis and the procedure was supervised.  BFR 400  Via AVG BP is  150/60.   Patient appears to be tolerating treatment well  [name] [name] 04/09/2018

## 2018-04-09 NOTE — Progress Notes (Signed)
Inpatient Rehabilitation Admissions Coordinator  I met with patient , spouse and daughter at bedside. We discussed goals and expectations of an inpt rehab admission. Pt previously admitted to CIR 11/2015 after back surgery and did well. Noted pt with c diff with need for Flexical to control her diarrhea. Once Flexiseal can be removed, I can pursue admit to inpt rehab. I will follow up tomorrow.  Danne Baxter, RN, MSN Rehab Admissions Coordinator [phone] 04/09/2018 4:06 PM

## 2018-04-09 NOTE — Progress Notes (Signed)
CARDIAC REHAB PHASE I   Appropriate education completed with pt and family. Pt given MI booklet. Discussed importance of ASA, Brilinta, and NTG. Will refer pt to CRP II [facility]. Cardiac Rehab will sign off as pt is only ambulating 7ft. Pt and family hopeful for CIR placement soon.  [phone] Rufina Falco, RN BSN 04/09/2018 2:29 PM

## 2018-04-10 ENCOUNTER — Inpatient Hospital Stay (HOSPITAL_COMMUNITY): Payer: Medicare Other

## 2018-04-10 DIAGNOSIS — Z951 Presence of aortocoronary bypass graft: Secondary | ICD-10-CM

## 2018-04-10 DIAGNOSIS — I472 Ventricular tachycardia: Secondary | ICD-10-CM

## 2018-04-10 LAB — GLUCOSE, CAPILLARY
GLUCOSE-CAPILLARY: 165 mg/dL — AB (ref 70–99)
GLUCOSE-CAPILLARY: 140 mg/dL — AB (ref 70–99)
GLUCOSE-CAPILLARY: 213 mg/dL — AB (ref 70–99)
Glucose-Capillary: 98 mg/dL (ref 70–99)

## 2018-04-10 LAB — BASIC METABOLIC PANEL
Anion gap: 11 (ref 5–15)
BUN: 19 mg/dL (ref 8–23)
CALCIUM: 8.7 mg/dL — AB (ref 8.9–10.3)
CHLORIDE: 98 mmol/L (ref 98–111)
CO2: 29 mmol/L (ref 22–32)
Creatinine, Ser: 3.13 mg/dL — ABNORMAL HIGH (ref 0.44–1.00)
GFR calc Af Amer: 15 mL/min — ABNORMAL LOW (ref >60)
GFR calc non Af Amer: 13 mL/min — ABNORMAL LOW (ref >60)
GLUCOSE: 124 mg/dL — AB (ref 70–99)
Potassium: 3.9 mmol/L (ref 3.5–5.1)
Sodium: 138 mmol/L (ref 135–145)

## 2018-04-10 LAB — CBC
HCT: 30.7 % — ABNORMAL LOW (ref 36.0–46.0)
Hemoglobin: 9.7 g/dL — ABNORMAL LOW (ref 12.0–15.0)
MCH: 33.9 pg (ref 26.0–34.0)
MCHC: 31.6 g/dL (ref 30.0–36.0)
MCV: 107.3 fL — AB (ref 78.0–100.0)
Platelets: 171 10*3/uL (ref 150–400)
RBC: 2.86 MIL/uL — AB (ref 3.87–5.11)
RDW: 14.5 % (ref 11.5–15.5)
WBC: 9.6 10*3/uL (ref 4.0–10.5)

## 2018-04-10 LAB — MAGNESIUM: MAGNESIUM: 2 mg/dL (ref 1.7–2.4)

## 2018-04-10 MED ORDER — HYDRALAZINE HCL 50 MG PO TABS
50.0000 mg | ORAL_TABLET | Freq: Three times a day (TID) | ORAL | Status: DC
  Administered 2018-04-10 – 2018-04-11 (×3): 50 mg via ORAL
  Filled 2018-04-10 (×3): qty 1

## 2018-04-10 MED ORDER — METOPROLOL TARTRATE 50 MG PO TABS
50.0000 mg | ORAL_TABLET | Freq: Two times a day (BID) | ORAL | Status: DC
  Administered 2018-04-10 – 2018-04-12 (×3): 50 mg via ORAL
  Filled 2018-04-10 (×4): qty 1

## 2018-04-10 MED ORDER — GUAIFENESIN-DM 100-10 MG/5ML PO SYRP
5.0000 mL | ORAL_SOLUTION | ORAL | Status: DC | PRN
  Administered 2018-04-10 – 2018-04-14 (×7): 5 mL via ORAL
  Filled 2018-04-10 (×8): qty 5

## 2018-04-10 MED ORDER — CHLORHEXIDINE GLUCONATE CLOTH 2 % EX PADS
6.0000 | MEDICATED_PAD | Freq: Every day | CUTANEOUS | Status: DC
  Administered 2018-04-10 – 2018-04-11 (×2): 6 via TOPICAL

## 2018-04-10 NOTE — Progress Notes (Signed)
Progress Note  Patient Name: [name] Date of Encounter: 04/10/2018  Primary Cardiologist: Larae Grooms, MD   Subjective   Yesterday evening at around 17 11-17 20 she had 3-4 episodes of ventricular tachycardia, torsades. Also had episodes of frequent PVCs last night, increase metoprolol, had bradycardia transiently into the 20s with brief loss of consciousness, this was in the setting of coughing, brown productive sputum, thought to be vagal.  Her metoprolol 50 mg twice a day was held.  we were asked to evaluate once again.  Currently asymptomatic  Inpatient Medications    Scheduled Meds: . aspirin  81 mg Oral Daily  . atorvastatin  40 mg Oral q1800  . B-complex with vitamin C  1 tablet Per Tube Daily  . brimonidine  1 drop Left Eye TID  . Chlorhexidine Gluconate Cloth  6 each Topical Q0600  . Chlorhexidine Gluconate Cloth  6 each Topical Q0600  . Chlorhexidine Gluconate Cloth  6 each Topical Q0600  . darbepoetin (ARANESP) injection - DIALYSIS  100 mcg Intravenous Q Thu-HD  . feeding supplement  1 Container Oral TID BM  . heparin  5,000 Units Subcutaneous Q8H  . hydrALAZINE  50 mg Oral Q8H  . insulin aspart  2-6 Units Subcutaneous TID WC  . insulin glargine  15 Units Subcutaneous Daily  . latanoprost  1 drop Both Eyes QHS  . mouth rinse  15 mL Mouth Rinse BID  . ticagrelor  90 mg Oral BID  . timolol  1 drop Left Eye Daily  . vancomycin  125 mg Oral QID   Continuous Infusions: . ferric gluconate (FERRLECIT/NULECIT) IV 125 mg (04/07/18 1945)   PRN Meds: acetaminophen, alteplase, alum & mag hydroxide-simeth, bisacodyl, calcium carbonate, docusate, Gerhardt's butt cream, guaiFENesin-dextromethorphan, heparin, hydrALAZINE, lidocaine (PF), lidocaine-prilocaine, ondansetron (ZOFRAN) IV, pentafluoroprop-tetrafluoroeth   Vital Signs    Vitals:   04/10/18 0127 04/10/18 0200 04/10/18 0500 04/10/18 0707  BP: (!) 159/30 (!) 172/41 (!) 186/83 (!) 184/83  Pulse: 72   64    Resp: (!) 24 20 15 20   Temp: 98.4 F (36.9 C)  97.8 F (36.6 C) 98.7 F (37.1 C)  TempSrc: Oral  Oral Oral  SpO2: 97% 97% 97% 97%  Weight: 57 kg     Height:        Intake/Output Summary (Last 24 hours) at 04/10/2018 0947 Last data filed at 04/09/2018 2000 Gross per 24 hour  Intake 120 ml  Output 2400 ml  Net -2280 ml   Filed Weights   04/09/18 0830 04/09/18 1245 04/10/18 0127  Weight: 59.2 kg 57 kg 57 kg    Telemetry    Nonsustained ventricular tachycardia, torsades noted yesterday 17: 20, 4 episodes.  Bradycardic episode noted earlier this morning during cough, vagal down to 20 bpm- Personally Reviewed  ECG    Prior EKG from 04/09/2018 shows right bundle branch block, sinus rhythm with QTC of 495- Personally Reviewed  Physical Exam   GEN: No acute distress.  Appears tired but smiling Neck: No JVD Cardiac: RRR occasional ectopy, no murmurs, rubs, or gallops.  Respiratory: Clear to auscultation bilaterally. GI: Soft, nontender, non-distended  MS: No edema; No deformity. Neuro:  Nonfocal  Psych: Normal affect, mild confusion  Labs    Chemistry Recent Labs  Lab 04/04/18 0239  04/09/18 0319 04/09/18 1922 04/10/18 0324  NA 140   < > 136 138 138  K 3.9   < > 3.6 3.9 3.9  CL 102   < > 98  96* 98  CO2 24   < > 24 28 29   GLUCOSE 206*   < > 136* 119* 124*  BUN 51*   < > 45* 15 19  CREATININE 3.90*   < > 4.92* 2.49* 3.13*  CALCIUM 8.6*   < > 8.8* 8.7* 8.7*  PROT 5.3*  --   --   --   --   ALBUMIN 2.2*  --   --   --   --   AST 45*  --   --   --   --   ALT 53*  --   --   --   --   ALKPHOS 58  --   --   --   --   BILITOT 0.6  --   --   --   --   GFRNONAA 10*   < > 8* 17* 13*  GFRAA 12*   < > 9* 20* 15*  ANIONGAP 14   < > 14 14 11    < > = values in this interval not displayed.     Hematology Recent Labs  Lab 04/23/2018 0414 04/09/18 0319 04/10/18 0324  WBC 8.9 10.1 9.6  RBC 2.63* 2.83* 2.86*  HGB 8.8* 9.4* 9.7*  HCT 28.3* 30.1* 30.7*  MCV 107.6* 106.4*  107.3*  MCH 33.5 33.2 33.9  MCHC 31.1 31.2 31.6  RDW 14.6 14.6 14.5  PLT 156 174 171    Cardiac EnzymesNo results for input(s): TROPONINI in the last 168 hours. No results for input(s): TROPIPOC in the last 168 hours.   BNPNo results for input(s): BNP, PROBNP in the last 168 hours.   DDimer No results for input(s): DDIMER in the last 168 hours.   Radiology    No results found.  Cardiac Studies  Cardiac catheterization 2018-04-23:  Ost 1st Mrg lesion is 75% stenosed.  Prox Cx lesion is 100% stenosed. Left to left collaterals. Small AV groove circumflex.  Ost 1st Diag lesion is 100% stenosed. SVG to diag is patent.  Prox LAD lesion is 95% stenosed. LIMA to LAD is patent.  Prox RCA lesion is 95% stenosed. Mid Graft lesion is 95% stenosed in SVG to PLA.  A drug-eluting stent was successfully placed using a STENT RESOLUTE ONYX 2.0X15.  Post intervention, there is a 0% residual stenosis.  LV end diastolic pressure is normal.  There is no aortic valve stenosis.  Calcified femoral artery.  Echocardiogram 03/2018: Study Conclusions  - Left ventricle: The cavity size was normal. Wall thickness was increased in a pattern of mild LVH. Systolic function was mildly reduced. The estimated ejection fraction was in the range of 45% to 50%. There is akinesis of the basal-midinferolateral and inferior myocardium. Doppler parameters are consistent with abnormal left ventricular relaxation (grade 1 diastolic dysfunction). - Mitral valve: Calcified annulus. There was mild regurgitation.  Impressions:  - Akinesis of the basal/mid inferior and inferolateral walls with overall mildly reduced LV systolic function; mild diastolic dysfunction; mild LVH; mild MR.  Patient Profile     77 y.o. female post cardiac VF arrest, revascularization of SVG to RCA graft on 04-23-18 with improving encephalopathy, end-stage renal disease on hemodialysis, aspiration pneumonia,  diabetes type 2 with recent bradycardic episode during cough  Assessment & Plan    Ventricular tachycardia nonsustained, torsades - Seen yesterday at approximately 5:11 PM, 4 separate episodes.  We had not seen this on telemetry throughout her hospitalization.  She did come in with VF arrest.  She was revascularized, SVG  to RCA stent placed by Dr. Irish Lack on 04/08/2018.  Was on metoprolol 50 mg twice a day until this morning.  This was stopped because of a bradycardic transient episode heart rate 20 bpm in the setting of cough, she was staring according to nursing.  Patient does not recall event. - Ordered new EKG. -Consulted electrophysiology -Off of Celexa since hospitalization.  Avoiding QT prolonging agents. -Magnesium yesterday was 2.0.  Potassium 3.9.  She is on hemodialysis.  Bradycardic episode 04/10/2018 morning - In the setting of cough, likely vagal. -Metoprolol 50 mg twice a day was held.  I will wait until EP consult before reintroducing.  VF arrest - Encephalopathy improving day by day. -Revascularization SVG to RCA -Awaiting rehab placement.  End-stage renal disease on hemodialysis - Per nephrology team.       For questions or updates, please contact [place] Please consult [email or web address] for contact info under        Signed, Candee Furbish, MD  04/10/2018, 9:47 AM

## 2018-04-10 NOTE — Consult Note (Addendum)
Cardiology Consultation:   Patient ID: [name] MRN: [id number]; DOB: [birth year]  Admit date: 04/02/2018 Date of Consult: 04/10/2018  Primary Care Provider: Josetta Huddle, MD Primary Cardiologist: Larae Grooms, MD  Primary Electrophysiologist:  None    Patient Profile:   [name] is a 77 y.o. 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 Marlou Porch.  History of Present Illness:   [name] in review of record: Admitted 04/03/18 was in her usual state of health today, and had an uneventful dialysis earlier in the afternoon.  She did complain of a mild headache and dizziness after her dialysis, although per report from family members, this was not unusual for her.  At no point was there any report of chest pains or shortness of breath.  She was eating dinner this evening, when her family members heard her plate fall to the ground, and found her to be unresponsive.  They called 911 and started CPR.  Upon EMS arrival initial rhythm was reportedly VF, for which she was defibrillated, but was then asystolic.  She ultimately regained spontaneous circulation following approximately 12 to 13 minutes of downtime In the ED, she was intubated for airway protection with ketamine and rocuronium.  Twelve-lead ECG showed ventricular bigeminy.  There were no apparent acute ischemic changes.  Initial labs showed no notable electrolyte disturbances.  Her lactate was 5 on presentation, and initial troponin I was 0.2.  She was admitted to the pulmonary critical care service.  The patient was noted to follow basic commands, and thus a cooling protocol was deferred.  Admitted 04/02/18, cardiac arrest Aspiration pneumonia likely, ? Choking event was mentioned in notes QT prolongation on EKG, home celexa stopped She was extubated 04/04/18,  confused Planned for cath pending extent of neuro recovery 04/08/18: LHC with stent to SVG>PLA 04/09/18 evening with recurrent NSPMVT episodes  LABS today K+ 3.9 Mag 2.0 BUN/Creat 19/3.13 WBC 9.6 H/H 9.7/30.7 PLTs 171  Past Medical History:  Diagnosis Date  . Anemia   . Arthritis   . Atrial fibrillation ([place])   . Blood transfusion   . CAD (coronary artery disease)   . Cancer ([place])    .  top of head- melonoma  . Carotid artery occlusion    Carotid Endartectom,y - left 2009.  Blockage Right being watched by Dr Scot Dock.  . Carotid stenosis   . 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 Complication of anesthesia    pt. states that she was difficult to wake  . Depression   . Diabetes mellitus without complication ([place])   . Dysrhythmia   . General weakness 12/2015  . GERD (gastroesophageal reflux disease)   . Glaucoma   . History of hiatal hernia   . History of kidney stones    passed  . History of pneumonia   . HOH (hard of hearing)   . Hypertension   . Hypokalemia 12/2015  . Myocardial infarction ([place])   . Neuromuscular disorder ([place])    CARPEL TUNNEL  . Pneumonia   . Shortness of breath   . Stroke ([place])    hx of TIA    Past Surgical History:  Procedure Laterality Date  . A/V FISTULAGRAM N/A 07/21/2017   Procedure: A/V FISTULAGRAM - Left Arm;  Surgeon:  Angelia Mould, MD;  Location: [place] [facility];  Service: Cardiovascular;  Laterality: N/A;  . AV FISTULA PLACEMENT Left 05/31/2014   Procedure: Creation of Left Arm arteriovenous brachiocephalic Fistula;  Surgeon: Angelia Mould, MD;  Location: [facility];  Service: Vascular;  Laterality: Left;  . AV FISTULA PLACEMENT Left 09/01/2014   Procedure: INSERTION OF ARTERIOVENOUS (AV) GORE-TEX GRAFT LEFT UPPER ARM USING  4-7 MM X 45 CM SRTETCH GORETEX GRAFT;  Surgeon: Angelia Mould, MD;  Location: [facility];  Service: Vascular;  Laterality: Left;  . CARDIAC CATHETERIZATION    . CAROTID  ENDARTERECTOMY Left 2009    CEA  . carpel tunnel    . COLONOSCOPY  07/25/2011   Procedure: COLONOSCOPY;  Surgeon: Winfield Cunas., MD;  Location: [facility] ENDOSCOPY;  Service: Endoscopy;  Laterality: N/A;  . CORONARY ARTERY BYPASS GRAFT  07/29/2011   Procedure: CORONARY ARTERY BYPASS GRAFTING (CABG);  Surgeon: Gaye Pollack, MD;  Location: [place];  Service: Open Heart Surgery;  Laterality: N/A;  . CORONARY STENT INTERVENTION N/A 04/08/2018   Procedure: CORONARY STENT INTERVENTION;  Surgeon: Jettie Booze, MD;  Location: [place] [facility];  Service: Cardiovascular;  Laterality: N/A;  . ENDARTERECTOMY Right 04/21/2015   Procedure: ENDARTERECTOMY CAROTID;  Surgeon: Angelia Mould, MD;  Location: [place];  Service: Vascular;  Laterality: Right;  . ESOPHAGOGASTRODUODENOSCOPY  07/25/2011   Procedure: ESOPHAGOGASTRODUODENOSCOPY (EGD);  Surgeon: Winfield Cunas., MD;  Location: [facility] ENDOSCOPY;  Service: Endoscopy;  Laterality: N/A;  . EYE SURGERY    . FISTULOGRAM Left 08/29/2014   Procedure: FISTULOGRAM;  Surgeon: Angelia Mould, MD;  [facility] C[facility] LAB;  Service: Cardiovascular;  Laterality: Left;  . KNEE SURGERY Left   . LEFT HEART CATH AND CORS/GRAFTS ANGIOGRAPHY N/A 04/08/2018   Procedure: LEFT HEART CATH AND CORS/GRAFTS ANGIOGRAPHY;  Surgeon: Jettie Booze, MD;  Location: [place] [facility];  Service: Cardiovascular;  Laterality: N/A;  . LUMBAR LAMINECTOMY/DECOMPRESSION MICRODISCECTOMY N/A 12/01/2015   Procedure: L5-S1 Decompression and Bilateral Microdiscectomy;  Surgeon: Marybelle Killings, MD;  Location: [place];  Service: Orthopedics;  Laterality: N/A;  . NECK SURGERY    . PERIPHERAL VASCULAR BALLOON ANGIOPLASTY  07/21/2017   Procedure: PERIPHERAL VASCULAR BALLOON ANGIOPLASTY;  Surgeon: Angelia Mould, MD;  Location: [place] [facility];  Service: Cardiovascular;;  LT Arm AVF  . TONSILLECTOMY       Home Medications:  Prior to Admission medications   Medication Sig  Start Date End Date Taking? Authorizing Provider  acetaminophen (TYLENOL) 325 MG tablet Take 2 tablets (650 mg total) by mouth every 6 (six) hours as needed for mild pain. 12/15/15  Yes Love, Ivan Anchors, PA-C  allopurinol (ZYLOPRIM) 100 MG tablet Take 200 mg by mouth daily.    Yes [provider]  ALPRAZolam (XANAX) 0.25 MG tablet Take 0.125 mg by mouth at bedtime.  03/31/18  Yes [provider]  aspirin EC 81 MG tablet Take 1 tablet (81 mg total) by mouth daily. 09/30/17  Yes Jettie Booze, MD  atorvastatin (LIPITOR) 10 MG tablet TAKE ONE TABLET BY MOUTH ONE TIME DAILY 02/21/14  Yes Jettie Booze, MD  b complex-C-folic acid 1 MG capsule Take 1 capsule by mouth daily.   Yes [provider]  brimonidine (ALPHAGAN P) 0.1 % SOLN Place 1 drop into the left eye 2 (two) times daily.   Yes [provider]  Cholecalciferol (VITAMIN D) 2000 UNITS tablet Take 2,000 Units  by mouth 2 (two) times daily.   Yes [provider]  citalopram (CELEXA) 20 MG tablet Take 20 mg by mouth daily.   Yes [provider]  colchicine 0.6 MG tablet Take 0.6 mg by mouth 2 (two) times daily as needed (for gout flares).    Yes [provider]  Cyanocobalamin (B-12) 5000 MCG CAPS Take 5,000 mcg by mouth daily.   Yes [provider]  insulin glargine (LANTUS) 100 UNIT/ML injection Inject 0.2-0.3 mLs (20-30 Units total) into the skin at bedtime. Patient taking differently: Inject 10-30 Units into the skin See admin instructions. Inject 10-30 units into the skin at bedtime, per sliding scale, and may inject an additional 10 units in the morning as needed for high blood sugar 12/15/15  Yes Love, Ivan Anchors, PA-C  Menthol-Methyl Salicylate (MUSCLE RUB) 10-15 % CREA Apply 1 application topically 2 (two) times daily. Patient taking differently: Apply 1 application topically as needed for muscle pain.  12/15/15  Yes Love, Ivan Anchors, PA-C  metoprolol tartrate (LOPRESSOR)  25 MG tablet Take 1 tablet (25 mg total) by mouth 2 (two) times daily. Patient taking differently: Take 25 mg by mouth every morning.  12/10/17  Yes Jettie Booze, MD  nitroGLYCERIN (NITROSTAT) 0.4 MG SL tablet Place 0.4 mg under the tongue every 5 (five) minutes as needed for chest pain.  07/10/11  Yes [provider]  Omega-3 Fatty Acids (FISH OIL) 1200 MG CAPS Take 1,200 mg by mouth 2 (two) times daily.   Yes [provider]  omeprazole (PRILOSEC) 20 MG capsule Take 20 mg by mouth daily as needed (acid reflux).    Yes [provider]  PROCTOSOL HC 2.5 % rectal cream Place 1 application rectally 2 (two) times daily as needed for hemorrhoids.  03/31/18  Yes [provider]  sitaGLIPtin (JANUVIA) 25 MG tablet Take 25 mg by mouth daily.  11/07/15  Yes [provider]  timolol (TIMOPTIC) 0.5 % ophthalmic solution Place 1 drop into the left eye daily.   Yes [provider]  travoprost, benzalkonium, (TRAVATAN) 0.004 % ophthalmic solution Place 1 drop into both eyes at bedtime.    Yes [provider]  isosorbide mononitrate (IMDUR) 30 MG 24 hr tablet Take 1 tablet (30 mg total) by mouth daily. Patient not taking: Reported on 04/02/2018 12/25/15   Hosie Poisson, MD  prazosin (MINIPRESS) 1 MG capsule TAKE 1 CAPSULE (1 MG TOTAL) BY MOUTH AT BEDTIME. Patient not taking: Reported on 04/02/2018 01/18/16   Meredith Staggers, MD    Inpatient Medications: Scheduled Meds: . aspirin  81 mg Oral Daily  . atorvastatin  40 mg Oral q1800  . B-complex with vitamin C  1 tablet Per Tube Daily  . brimonidine  1 drop Left Eye TID  . Chlorhexidine Gluconate Cloth  6 each Topical Q0600  . Chlorhexidine Gluconate Cloth  6 each Topical Q0600  . Chlorhexidine Gluconate Cloth  6 each Topical Q0600  . darbepoetin (ARANESP) injection - DIALYSIS  100 mcg Intravenous Q Thu-HD  . feeding supplement  1 Container Oral TID BM  . heparin  5,000 Units Subcutaneous Q8H    . hydrALAZINE  50 mg Oral Q8H  . insulin aspart  2-6 Units Subcutaneous TID WC  . insulin glargine  15 Units Subcutaneous Daily  . latanoprost  1 drop Both Eyes QHS  . mouth rinse  15 mL Mouth Rinse BID  . ticagrelor  90 mg Oral BID  . timolol  1 drop Left Eye Daily  . vancomycin  125 mg Oral QID   Continuous Infusions: . ferric gluconate (FERRLECIT/NULECIT) IV 125 mg (04/07/18 1945)   PRN Meds: acetaminophen, alteplase, alum & mag hydroxide-simeth, bisacodyl, calcium carbonate, docusate, Gerhardt's butt cream, guaiFENesin-dextromethorphan, heparin, hydrALAZINE, lidocaine (PF), lidocaine-prilocaine, ondansetron (ZOFRAN) IV, pentafluoroprop-tetrafluoroeth  Allergies:    Allergies  Allergen Reactions  . Gabapentin Other (See Comments)    Hallucinations and "Makes me go crazy"   . Sulfa Antibiotics Other (See Comments)    Altered mental state   . Amlodipine Other (See Comments)    "Makes my legs swell"; edema    Social History:   Social History   Socioeconomic History  . Marital status: Married    Spouse name: Not on file  . Number of children: Not on file  . Years of education: Not on file  . Highest education level: Not on file  Occupational History  . Not on file  Social Needs  . Financial resource strain: Not on file  . Food insecurity:    Worry: Not on file    Inability: Not on file  . Transportation needs:    Medical: Not on file    Non-medical: Not on file  Tobacco Use  . Smoking status: Never Smoker  . Smokeless tobacco: Never Used  Substance and Sexual Activity  . Alcohol use: No    Alcohol/week: 0.0 standard drinks  . Drug use: No  . Sexual activity: Never    Birth control/protection: Post-menopausal  Lifestyle  . Physical activity:    Days per week: Not on file    Minutes per session: Not on file  . Stress: Not on file  Relationships  . Social connections:    Talks on phone: Not on file    Gets together: Not on file    Attends religious  service: Not on file    Active member of club or organization: Not on file    Attends meetings of clubs or organizations: Not on file    Relationship status: Not on file  . Intimate partner violence:    Fear of current or ex partner: Not on file    Emotionally abused: Not on file    Physically abused: Not on file    Forced sexual activity: Not on file  Other Topics Concern  . Not on file  Social History Narrative  . Not on file    Family History:   Family History  Problem Relation Age of Onset  . Diabetes Mother   . Hypertension Mother   . Heart disease Mother        beofre age 25  . Heart attack Mother   . Stroke Mother   . Cancer Father   . Hyperlipidemia Father   . Hypertension Father   . Deep vein thrombosis Daughter   . Diabetes Daughter   . Hyperlipidemia Daughter   . Hypertension Daughter   . Heart disease Daughter   . Peripheral vascular disease Daughter   . Breast cancer Neg Hx      ROS:  Please see the history of present illness.  All other ROS reviewed and negative.     Physical Exam/Data:   Vitals:   04/10/18 0200 04/10/18 0500 04/10/18 0707 04/10/18 1056  BP: (!) 172/41 (!) 186/83 (!) 184/83 (!) 121/30  Pulse:   64 65  Resp: 20 15 20  (!) 22  Temp:  97.8 F (36.6 C) 98.7 F (37.1 C) 98.6 F (37 C)  TempSrc:  Oral Oral Oral  SpO2: 97% 97% 97% 96%  Weight:      Height:        Intake/Output Summary (Last 24 hours) at 04/10/2018 1415 Last data filed at 04/09/2018 2000 Gross per 24 hour  Intake 120 ml  Output 200 ml  Net -80 ml   Filed Weights   04/09/18 0830 04/09/18 1245 04/10/18 0127  Weight: 59.2 kg 57 kg 57 kg   Body mass index is 21.57 kg/m.  General:  Well nourished, well developed, in no acute distress, looks chronically ill HEENT: normal Lymph: no adenopathy Neck: no JVD Endocrine:  No thryomegaly Vascular: No carotid bruits  Cardiac:  RRR; no murmurs, gallops or rubs Lungs:  CTA b/l, no wheezing, rhonchi or rales  Abd: soft,  nontender  Ext: no edema Musculoskeletal:  No deformitiesl Skin: warm and dry  Neuro:   No gross focal abnormalities noted Psych:  Normal affect   EKG:  The EKG was personally reviewed and demonstrates:   Today is SR 62bpm, QTc, 546ms Initial EKG is SR, V bigemeny  Telemetry:  Telemetry was personally reviewed and demonstrates:   SR, occ PVCs, couplets, last evening with a period of a couple hours with recurrent PMVT (nonsustained) episodes, this so fa today has settled back don, with only occasional PVCs, rare couplet.  Very transient SB at times  Relevant CV Studies:  04/08/18: LHC/PCI to SVG to PLA  Ost 1st Mrg lesion is 75% stenosed.  Prox Cx lesion is 100% stenosed. Left to left collaterals. Small AV groove circumflex.  Ost 1st Diag lesion is 100% stenosed. SVG to diag is patent.  Prox LAD lesion is 95% stenosed. LIMA to LAD is patent.  Prox RCA lesion is 95% stenosed. Mid Graft lesion is 95% stenosed in SVG to PLA.  A drug-eluting stent was successfully placed using a STENT RESOLUTE ONYX 2.0X15.  Post intervention, there is a 0% residual stenosis.  LV end diastolic pressure is normal.  There is no aortic valve stenosis.  Calcified femoral artery.  Echo: 04/03/18:  Study Conclusions - Left ventricle: The cavity size was normal. Wall thickness was increased in a pattern of mild LVH. Systolic function was mildly reduced. The estimated ejection fraction was in the range of 45% to 50%. There is akinesis of the basal-midinferolateral and inferior myocardium. Doppler parameters are consistent with abnormal left ventricular relaxation (grade 1 diastolic dysfunction). - Mitral valve: Calcified annulus. There was mild regurgitation. Impressions: - Akinesis of the basal/mid inferior and inferolateral walls with overall mildly reduced LV systolic function; mild diastolic dysfunction; mild LVH; mild MR.  Laboratory Data:  Chemistry Recent Labs  Lab  04/09/18 0319 04/09/18 1922 04/10/18 0324  NA 136 138 138  K 3.6 3.9 3.9  CL 98 96* 98  CO2 24 28 29   GLUCOSE 136* 119* 124*  BUN 45* 15 19  CREATININE 4.92* 2.49* 3.13*  CALCIUM 8.8* 8.7* 8.7*  GFRNONAA 8* 17* 13*  GFRAA 9* 20* 15*  ANIONGAP 14 14 11     Recent Labs  Lab 04/04/18 0239  PROT 5.3*  ALBUMIN 2.2*  AST 45*  ALT 53*  ALKPHOS 58  BILITOT 0.6   Hematology Recent Labs  Lab 04/08/18 0414 04/09/18 0319 04/10/18 0324  WBC 8.9 10.1 9.6  RBC 2.63* 2.83* 2.86*  HGB 8.8* 9.4* 9.7*  HCT 28.3* 30.1* 30.7*  MCV 107.6* 106.4* 107.3*  MCH 33.5 33.2 33.9  MCHC 31.1 31.2 31.6  RDW 14.6 14.6 14.5  PLT 156 174 171   Cardiac EnzymesNo results for input(s): TROPONINI in the last 168 hours. No results for input(s): TROPIPOC in the last 168 hours.  BNPNo results for input(s): BNP, PROBNP in the last 168 hours.  DDimer No results for input(s): DDIMER in the last 168 hours.  Radiology/Studies:   Dg Chest 2 View Result Date: 04/10/2018 CLINICAL DATA:  ESRD on HD TThS, coronary disease, HTN, and IDDM who presented with VF arrest. Not cooled because was able to interact immediately after resuscitation. Also diarrhea on admission, Cdiff antigen positive and suspected aspiration pneumonia. EXAM: CHEST - 2 VIEW COMPARISON:  04/03/2018 FINDINGS: Status post median sternotomy and CABG. The heart is mildly enlarged. No pulmonary edema. There is focal opacity in the LEFT lung base, associated with small pleural effusion, consistent with early infiltrate or atelectasis. IMPRESSION: 1. LEFT LOWER lobe infiltrate/atelectasis and small effusion. 2. Cardiomegaly.  No edema. Electronically Signed   By: Nolon Nations M.D.   On: 04/10/2018 13:44    Assessment and Plan:   1. VF arrest 2. CAD (remote CABG)      S/p PCI to the SVG of PLA on 04/08/18       NSPMVT episodes last evening within 48 post PCI window, and could represent reperfusion These seem to have settled down without further  today If more, would add AAD Avoid QT prolonging meds Resume her BB Transient brady event this AM with clear P-P prolongation while coughing apparently associated with decreased mentation/breif LOC This was likely vagal with coughing episode, resume her BB  Dr. Curt Bears has seen and examined the patient   For questions or updates, please contact [facility] Please consult [email or web address] for contact info under     Signed, Baldwin Jamaica, PA-C  04/10/2018 2:15 PM   I have seen and examined this patient with Tommye Standard.  Agree with above, note added to reflect my findings.  On exam, RRR, no murmurs, lungs clear.  Patient presented to the hospital with cardiac arrest.  Was found to have an occluded saphenous vein status post intervention on 04/08/2018.  Yesterday evening, she had multiple episodes of nonsustained polymorphic ventricular tachycardia.  Fortunately for her this is within 48 hours of her intervention.  At this point, I do not feel that an ICD would be indicated.  She has had no further ventricular arrhythmias since her episodes yesterday evening.  We [facility] continue to monitor her overnight, and would call EP back should she have more.  Sagan Maselli M. Jaxyn Rout MD 04/10/2018 5:12 PM

## 2018-04-10 NOTE — Progress Notes (Signed)
[name] NEPHROLOGY PROGRESS NOTE  Assessment/ Plan: Pt is a 77 y.o. yo female ESRD, cardiac arrest at home  Dialysis:[facility]  4h 76kg 3K/ 2Ca bath LUA AVG Hep none - mircera 100 q 2  #VF arrest: Positive troponin echo with new wall motion abnormalities.  S/p cath on 9/25 with stent placement. Now on aspirin and brilinta for at least 12 months. No chest pain or SOB today. Had pause today therefore holding metoprolol.   # ESRD: TTS, had dialysis yesterday with 2.2 L UF, tolerated well. Left upper extremity AV graft for access. Next HD tomorrow.  # Anemia: Hemoglobin 8-9. Iron sat 15 %, ferritin 1432, Started weekly nulecit.   # Secondary hyperparathyroidism: ca ok. Not on binders.   # HTN/volume: BP elevated, off metoprol because of pause. Increased  Hydralazine to 50 mg TID. Volume status looks acceptable.    Subjective: Seen and examined at dialysis unit.  Had pause today per nurse.  Patient denied nausea, vomiting, chest pain or shortness of breath.  Objective Vital signs in last 24 hours: Vitals:   04/10/18 0127 04/10/18 0200 04/10/18 0500 04/10/18 0707  BP: (!) 159/30 (!) 172/41 (!) 186/83 (!) 184/83  Pulse: 72   64  Resp: (!) 24 20 15 20   Temp: 98.4 F (36.9 C)  97.8 F (36.6 C) 98.7 F (37.1 C)  TempSrc: Oral  Oral Oral  SpO2: 97% 97% 97% 97%  Weight: 57 kg     Height:       Weight change: 0.1 kg  Intake/Output Summary (Last 24 hours) at 04/10/2018 0934 Last data filed at 04/09/2018 2000 Gross per 24 hour  Intake 120 ml  Output 2400 ml  Net -2280 ml       Labs: Basic Metabolic Panel: Recent Labs  Lab 04/09/18 0319 04/09/18 1922 04/10/18 0324  NA 136 138 138  K 3.6 3.9 3.9  CL 98 96* 98  CO2 24 28 29   GLUCOSE 136* 119* 124*  BUN 45* 15 19  CREATININE 4.92* 2.49* 3.13*  CALCIUM 8.8* 8.7* 8.7*   Liver Function Tests: Recent Labs  Lab 04/04/18 0239  AST 45*  ALT 53*  ALKPHOS 58  BILITOT 0.6  PROT 5.3*  ALBUMIN 2.2*    No results for input(s): LIPASE, AMYLASE in the last 168 hours. No results for input(s): AMMONIA in the last 168 hours. CBC: Recent Labs  Lab 04/06/18 0232 04/07/18 0535 04/08/18 0414 04/09/18 0319 04/10/18 0324  WBC 10.3 9.1 8.9 10.1 9.6  HGB 8.8* 9.4* 8.8* 9.4* 9.7*  HCT 28.1* 29.3* 28.3* 30.1* 30.7*  MCV 107.7* 106.2* 107.6* 106.4* 107.3*  PLT 155 166 156 174 171   Cardiac Enzymes: No results for input(s): CKTOTAL, CKMB, CKMBINDEX, TROPONINI in the last 168 hours. CBG: Recent Labs  Lab 04/09/18 0601 04/09/18 1347 04/09/18 1739 04/09/18 2102 04/10/18 0516  GLUCAP 102* 91 138* 76 98    Iron Studies:  No results for input(s): IRON, TIBC, TRANSFERRIN, FERRITIN in the last 72 hours. Studies/Results: No results found.  Medications: Infusions: . ferric gluconate (FERRLECIT/NULECIT) IV 125 mg (04/07/18 1945)    Scheduled Medications: . aspirin  81 mg Oral Daily  . atorvastatin  40 mg Oral q1800  . B-complex with vitamin C  1 tablet Per Tube Daily  . brimonidine  1 drop Left Eye TID  . Chlorhexidine Gluconate Cloth  6 each Topical Q0600  . Chlorhexidine Gluconate Cloth  6 each Topical Q0600  . darbepoetin (ARANESP) injection -  DIALYSIS  100 mcg Intravenous Q Thu-HD  . feeding supplement  1 Container Oral TID BM  . heparin  5,000 Units Subcutaneous Q8H  . hydrALAZINE  25 mg Oral Q8H  . insulin aspart  2-6 Units Subcutaneous TID WC  . insulin glargine  15 Units Subcutaneous Daily  . latanoprost  1 drop Both Eyes QHS  . mouth rinse  15 mL Mouth Rinse BID  . ticagrelor  90 mg Oral BID  . timolol  1 drop Left Eye Daily  . vancomycin  125 mg Oral QID    have reviewed scheduled and prn medications.  Physical Exam: General: Lying in bed comfortable, not in distress Heart:RRR, s1s2 nl Lungs: Clear bilateral, good air entry. Abdomen:soft, Non-tender, non-distended Extremities: No edema Dialysis Access: Left upper extremity AV graft has good bruit.  Dron Prasad  Bhandari 04/10/2018,9:34 AM  LOS: 8 days

## 2018-04-10 NOTE — Progress Notes (Signed)
Physical Therapy Treatment Patient Details Name: [name] MRN: [id number] DOB: 11-11-1940 Today's Date: 04/10/2018    History of Present Illness Pt adm with vfib arrest with out of hospital CPR x 12 minutes. Intubated 9/19-9/21. Pt also +Cdiff. PMH - ESRD with recent initiation of HD, cabg, ckd, dm, mi, lt knee surgery, neck surgery, back surgery, afib, CEA, CVA, multiple falls.     PT Comments    Patient with limited tolerance to mobility this date.  Reports did not sleep well due to heart issues and has remained fatigued today.  Still participated in EOB and standing activities.  She will need continued skilled PT in the acute setting to maximize mobility and safety prior to d/c to CIR level rehab.    Follow Up Recommendations  CIR;Supervision/Assistance - 24 hour     Equipment Recommendations  None recommended by PT    Recommendations for Other Services       Precautions / Restrictions Precautions Precautions: Fall Precaution Comments: rectal tube    Mobility  Bed Mobility Overal bed mobility: Needs Assistance Bed Mobility: Rolling;Sidelying to Sit Rolling: Min assist Sidelying to sit: Mod assist Supine to sit: Mod assist;+2 for physical assistance     General bed mobility comments: assist for legs off bed and trunk upright, to sidelying assist for legs onto bed and to position  Transfers Overall transfer level: Needs assistance Equipment used: 2 person hand held assist Transfers: Sit to/from Stand Sit to Stand: Mod assist;+2 physical assistance         General transfer comment: some lifting help from EOB  Ambulation/Gait Ambulation/Gait assistance: Mod assist;+2 physical assistance Gait Distance (Feet): 2 Feet Assistive device: 2 person hand held assist Gait Pattern/deviations: Step-to pattern;Decreased stride length     General Gait Details: side steps up to [facility] with fatigue and weakness with bilateral HHA   Stairs             Wheelchair  Mobility    Modified Rankin (Stroke Patients Only)       Balance Overall balance assessment: Needs assistance Sitting-balance support: Feet supported Sitting balance-Leahy Scale: Fair     Standing balance support: Bilateral upper extremity supported Standing balance-Leahy Scale: Poor Standing balance comment: bilateral UE support for standing                            Cognition Arousal/Alertness: Awake/alert Behavior During Therapy: Anxious Overall Cognitive Status: Impaired/Different from baseline Area of Impairment: Memory;Problem solving                     Memory: Decreased short-term memory       Problem Solving: Slow processing;Difficulty sequencing;Requires verbal cues        Exercises General Exercises - Lower Extremity Long Arc Quad: AROM;Both;5 reps;Seated Hip ABduction/ADduction: AROM;10 reps;Both;Seated    General Comments General comments (skin integrity, edema, etc.): spouse in room with knee immobilizer on R leg reports hip dislocated 2x in 8 weeks (replacement in 2005)      Pertinent Vitals/Pain Pain Assessment: Faces Faces Pain Scale: Hurts even more Pain Location: chest with coughing; rectum after sitting Pain Descriptors / Indicators: Grimacing;Guarding Pain Intervention(s): Monitored during session;Repositioned;Other (comment)(education on splinting with pillow and rectum cleansed and cream applied)    Home Living                      Prior Function  PT Goals (current goals can now be found in the care plan section) Progress towards PT goals: Not progressing toward goals - comment    Frequency    Min 3X/week      PT Plan Current plan remains appropriate    Co-evaluation              AM-PAC PT "6 Clicks" Daily Activity  Outcome Measure  Difficulty turning over in bed (including adjusting bedclothes, sheets and blankets)?: Unable Difficulty moving from lying on back to sitting on  the side of the bed? : Unable Difficulty sitting down on and standing up from a chair with arms (e.g., wheelchair, bedside commode, etc,.)?: Unable Help needed moving to and from a bed to chair (including a wheelchair)?: A Lot Help needed walking in hospital room?: A Lot Help needed climbing 3-5 steps with a railing? : Total 6 Click Score: 8    End of Session Equipment Utilized During Treatment: Gait belt Activity Tolerance: Patient limited by fatigue Patient left: in bed;with bed alarm set;with family/visitor present;with call bell/phone within reach   PT Visit Diagnosis: Unsteadiness on feet (R26.81);Other abnormalities of gait and mobility (R26.89);Repeated falls (R29.6);Muscle weakness (generalized) (M62.81)     Time: 7092-9574 PT Time Calculation (min) (ACUTE ONLY): 29 min  Charges:  $Therapeutic Exercise: 8-22 mins $Therapeutic Activity: 8-22 mins                     Magda Kiel, [place] [facility] [phone] 04/10/2018    Reginia Naas 04/10/2018, 5:16 PM

## 2018-04-10 NOTE — Progress Notes (Signed)
Pt with transfer order to 2C09. Report given to [facility] @19 :42. T=98; BP=150/16;HR=78; O2 Sats=100% on RA. Pt is A & Ox4. Transferred without difficulty by SWOT RN & NT @ 20:20.

## 2018-04-10 NOTE — Progress Notes (Signed)
Inpatient Rehabilitation Admissions Coordinator  Noted medical issues overnight. Pt currently not in room, but I met with her spouse at bedside. He states heart rate issues after dialysis have occurred three times that he is aware of since beginning dialysis a few weeks ago. I will follow up on Monday as cardiology and electrophysiology have been consulted.  Danne Baxter, RN, MSN Rehab Admissions Coordinator [phone] 04/10/2018 12:16 PM

## 2018-04-10 NOTE — Progress Notes (Addendum)
PROGRESS NOTE    [name]  BTD:[id number] DOB: 06-Oct-1940 DOA: 04/02/2018 PCP: Josetta Huddle, MD    Brief Narrative:  [name] is a 77 y.o. F with ESRD on HD TThS, coronary disease, HTN, and IDDM who presented with VF arrest.  Not cooled because was able to interact immediately after resuscitation.  Also diarrhea on admission, Cdiff antigen positive and suspected aspiration pneumonia.   Subjective: A bit stronger, coughing a lot, had a bradycardic episode w/ some brief LOC per RN this am w/ HR's in the 20's.  RN feels it was vagal due to coughing.  Cough +prod brown sputum  Objective: Vitals:   04/10/18 0127 04/10/18 0200 04/10/18 0500 04/10/18 0707  BP: (!) 159/30 (!) 172/41 (!) 186/83 (!) 184/83  Pulse: 72   64  Resp: (!) 24 20 15 20   Temp: 98.4 F (36.9 C)  97.8 F (36.6 C) 98.7 F (37.1 C)  TempSrc: Oral  Oral Oral  SpO2: 97% 97% 97% 97%  Weight: 57 kg     Height:        Examination: General appearance: Obese adult female, lying in bed, on dialysis.  No obvious distress. Skin: Warm and dry.  No jaundice.   Cardiac: Heart rate normal, no murmurs appreciated, JVP not visible, no lower extremity edema. Respiratory: R base dec's BS/ Rales, some bronch BS. L side clear Abdomen: No abdominal discomfort or tenderness to palpation or guarding.  No distention. MSK: No deformities or effusions in the large joints of the upper or lower extremities.. Neuro: oriented to place, day of week, not to year, follows commands.  Moves all extremities with global weakness, symmetric coordination.    Assessment & Plan:  VF arrest 12 minutes OOH CPR.  ROSC by time of arrival to ER.  Not cooled due to ability to interact on arrival to hospital.  Echo now with reduced EF, RWMA.  Got 2d heparin gtt.  Left heart cath 9/26 with DES to SVG to PLA. -Continue aspirin and Brilinta for 12 months -Continue atorvastatin - Metoprolol will be held , see below -Cards signed  off  Bradycardia: assoc w/ coughing this am, ?vagal episode , HR was in the 20's w/ near LOC this am.  Will hold metop and ask cardiology to re-assess.   Hypertension -Continue hydralazine, hold metop for now  C. difficile diarrhea Profuse foul-smelling diarrhea at admission.  C. difficile antigen positive, toxin negative. -Continue oral Vanc 125 qid, day 8 of 10  Aspiration pneumonia Completed 5 days of Zosyn.  WBC resolved.  - new cough, brown sputum, check CXR today  ESRD on HD -Continue nephrology, appreciate care  Diabetes Glucose well controlled -Continue Glargine -Continue SSI  Anemia of chronic renal insufficiency No change to Hgb  Thrombocytopenia Resolved  Prolonged QTc -SSRI dc'd   [name] MD [facility] pgr [phone] 12/07/2017, 9:25 AM     DVT prophylaxis: Heparin Code Status: FULL Family Communication: no family here this am MDM and disposition Plan: currently in evaluation for CIR. Lab holiday tomorrow     Consultants:   Cardiology  Nephrology  CCM  Procedures:   Intubation 9/20  Extubation 9/21  Echocardiogram 9/20 LV EF: 45% -   50%  ------------------------------------------------------------------- Indications:      Cardiac arrest 427.5.  ------------------------------------------------------------------- History:   PMH:  Chronic kidney disease.  Atrial fibrillation. Coronary artery disease.  Congestive heart failure.  Risk factors: Hypertension. Dyslipidemia.  ------------------------------------------------------------------- Study Conclusions  - Left ventricle:  The cavity size was normal. Wall thickness was   increased in a pattern of mild LVH. Systolic function was mildly   reduced. The estimated ejection fraction was in the range of 45%   to 50%. There is akinesis of the basal-midinferolateral and   inferior myocardium. Doppler parameters are consistent with   abnormal left ventricular  relaxation (grade 1 diastolic   dysfunction). - Mitral valve: Calcified annulus. There was mild regurgitation.  Impressions:  - Akinesis of the basal/mid inferior and inferolateral walls with   overall mildly reduced LV systolic function; mild diastolic   dysfunction; mild LVH; mild MR.   Angiography 9/25  Ost 1st Mrg lesion is 75% stenosed.  Prox Cx lesion is 100% stenosed. Left to left collaterals. Small AV groove circumflex.  Ost 1st Diag lesion is 100% stenosed. SVG to diag is patent.  Prox LAD lesion is 95% stenosed. LIMA to LAD is patent.  Prox RCA lesion is 95% stenosed. Mid Graft lesion is 95% stenosed in SVG to PLA.  A drug-eluting stent was successfully placed using a STENT RESOLUTE ONYX 2.0X15.  Post intervention, there is a 0% residual stenosis.  LV end diastolic pressure is normal.  There is no aortic valve stenosis.  Calcified femoral artery.     Recommend uninterrupted dual antiplatelet therapy with Aspirin 81mg  daily and Ticagrelor 90mg  twice daily for a minimum of 12 months (ACS - Class I recommendation).       Antimicrobials:   Zosyn 9/19 >> 9/24  Oral vancomycin 9/20 >>        Data Reviewed: I have personally reviewed following labs and imaging studies:  CBC: Recent Labs  Lab 04/06/18 0232 04/07/18 0535 04/08/18 0414 04/09/18 0319 04/10/18 0324  WBC 10.3 9.1 8.9 10.1 9.6  HGB 8.8* 9.4* 8.8* 9.4* 9.7*  HCT 28.1* 29.3* 28.3* 30.1* 30.7*  MCV 107.7* 106.2* 107.6* 106.4* 107.3*  PLT 155 166 156 174 846   Basic Metabolic Panel: Recent Labs  Lab 04/07/18 0535 04/08/18 0414 04/09/18 0319 04/09/18 1922 04/10/18 0324  NA 137 137 136 138 138  K 4.2 3.6 3.6 3.9 3.9  CL 93* 95* 98 96* 98  CO2 25 25 24 28 29   GLUCOSE 83 156* 136* 119* 124*  BUN 67* 32* 45* 15 19  CREATININE 5.93* 3.73* 4.92* 2.49* 3.13*  CALCIUM 9.4 8.2* 8.8* 8.7* 8.7*  MG  --   --   --  2.0  --    GFR: Estimated Creatinine Clearance: 12.8 mL/min (A)  (by C-G formula based on SCr of 3.13 mg/dL (H)). Liver Function Tests: Recent Labs  Lab 04/04/18 0239  AST 45*  ALT 53*  ALKPHOS 58  BILITOT 0.6  PROT 5.3*  ALBUMIN 2.2*   No results for input(s): LIPASE, AMYLASE in the last 168 hours. No results for input(s): AMMONIA in the last 168 hours. Coagulation Profile: No results for input(s): INR, PROTIME in the last 168 hours. Cardiac Enzymes: No results for input(s): CKTOTAL, CKMB, CKMBINDEX, TROPONINI in the last 168 hours. BNP (last 3 results) No results for input(s): PROBNP in the last 8760 hours. HbA1C: No results for input(s): HGBA1C in the last 72 hours. CBG: Recent Labs  Lab 04/09/18 0601 04/09/18 1347 04/09/18 1739 04/09/18 2102 04/10/18 0516  GLUCAP 102* 91 138* 76 98   Lipid Profile: No results for input(s): CHOL, HDL, LDLCALC, TRIG, CHOLHDL, LDLDIRECT in the last 72 hours. Thyroid Function Tests: No results for input(s): TSH, T4TOTAL, FREET4, T3FREE, THYROIDAB in the last  72 hours. Anemia Panel: No results for input(s): VITAMINB12, FOLATE, FERRITIN, TIBC, IRON, RETICCTPCT in the last 72 hours. Urine analysis:    Component Value Date/Time   COLORURINE YELLOW 12/21/2015 2222   APPEARANCEUR CLEAR 12/21/2015 2222   LABSPEC 1.013 12/21/2015 2222   PHURINE 6.5 12/21/2015 2222   GLUCOSEU NEGATIVE 12/21/2015 2222   HGBUR MODERATE (A) 12/21/2015 2222   BILIRUBINUR NEGATIVE 12/21/2015 2222   KETONESUR NEGATIVE 12/21/2015 2222   PROTEINUR >300 (A) 12/21/2015 2222   UROBILINOGEN 0.2 04/19/2015 1248   NITRITE POSITIVE (A) 12/21/2015 2222   LEUKOCYTESUR TRACE (A) 12/21/2015 2222   Sepsis Labs: @LABRCNTIP (procalcitonin:4,lacticacidven:4)  ) Recent Results (from the past 240 hour(s))  Culture, blood (routine x 2)     Status: None   Collection Time: 04/02/18  8:00 PM  Result Value Ref Range Status   Specimen Description BLOOD LEFT UPPER ARM  Final   Special Requests   Final    BOTTLES DRAWN AEROBIC ONLY Blood  Culture results may not be optimal due to an inadequate volume of blood received in culture bottles   Culture   Final    NO GROWTH 5 DAYS Performed at [facility], [place] [address], [place], [place] [address]    Report Status 04/07/2018 FINAL  Final  Culture, blood (routine x 2)     Status: None   Collection Time: 04/02/18  8:37 PM  Result Value Ref Range Status   Specimen Description BLOOD RIGHT ANTECUBITAL  Final   Special Requests   Final    BOTTLES DRAWN AEROBIC AND ANAEROBIC Blood Culture results may not be optimal due to an inadequate volume of blood received in culture bottles   Culture   Final    NO GROWTH 5 DAYS Performed at [facility], [place] [address], [place], [place] [address]    Report Status 04/07/2018 FINAL  Final  MRSA PCR Screening     Status: None   Collection Time: 04/02/18 11:37 PM  Result Value Ref Range Status   MRSA by PCR NEGATIVE NEGATIVE Final    Comment:        The GeneXpert MRSA Assay (FDA approved for NASAL specimens only), is one component of a comprehensive MRSA colonization surveillance program. It is not intended to diagnose MRSA infection nor to guide or monitor treatment for MRSA infections. Performed at [facility], [place] [address] [place], [place] [address]   C difficile quick scan w PCR reflex     Status: Abnormal   Collection Time: 04/03/18  2:29 AM  Result Value Ref Range Status   C Diff antigen POSITIVE (A) NEGATIVE Final   C Diff toxin NEGATIVE NEGATIVE Final   C Diff interpretation Results are indeterminate. See PCR results.  Final    Comment: Performed at [facility], [place] [address] [place], [place] [address]  C. Diff by PCR, Reflexed     Status: Abnormal   Collection Time: 04/03/18  2:29 AM  Result Value Ref Range Status   Toxigenic C. Difficile by PCR POSITIVE (A) NEGATIVE Final    Comment: Positive for toxigenic C. difficile with little to no toxin production. Only treat if clinical presentation  suggests symptomatic illness. Performed at [facility], [place] [address], [place],  [address]   Culture, respiratory (non-expectorated)     Status: None   Collection Time: 04/03/18  3:25 AM  Result Value Ref Range Status   Specimen Description TRACHEAL ASPIRATE  Final  Special Requests NONE  Final   Gram Stain   Final    MODERATE WBC PRESENT, PREDOMINANTLY MONONUCLEAR FEW GRAM POSITIVE COCCI FEW YEAST    Culture   Final    Consistent with normal respiratory flora. Performed at [facility], [place] [address], [place], [place] [address]    Report Status 04/05/2018 FINAL  Final         Radiology Studies: No results found.      Scheduled Meds: . aspirin  81 mg Oral Daily  . atorvastatin  40 mg Oral q1800  . B-complex with vitamin C  1 tablet Per Tube Daily  . brimonidine  1 drop Left Eye TID  . Chlorhexidine Gluconate Cloth  6 each Topical Q0600  . Chlorhexidine Gluconate Cloth  6 each Topical Q0600  . darbepoetin (ARANESP) injection - DIALYSIS  100 mcg Intravenous Q Thu-HD  . feeding supplement  1 Container Oral TID BM  . heparin  5,000 Units Subcutaneous Q8H  . hydrALAZINE  25 mg Oral Q8H  . insulin aspart  2-6 Units Subcutaneous TID WC  . insulin glargine  15 Units Subcutaneous Daily  . latanoprost  1 drop Both Eyes QHS  . mouth rinse  15 mL Mouth Rinse BID  . ticagrelor  90 mg Oral BID  . timolol  1 drop Left Eye Daily  . vancomycin  125 mg Oral QID   Continuous Infusions: . ferric gluconate (FERRLECIT/NULECIT) IV 125 mg (04/07/18 1945)     LOS: 8 days    Time spent: 25 minutes

## 2018-04-11 ENCOUNTER — Other Ambulatory Visit: Payer: Self-pay

## 2018-04-11 LAB — RENAL FUNCTION PANEL
ALBUMIN: 2.2 g/dL — AB (ref 3.5–5.0)
ANION GAP: 16 — AB (ref 5–15)
BUN: 34 mg/dL — ABNORMAL HIGH (ref 8–23)
CO2: 25 mmol/L (ref 22–32)
Calcium: 8.9 mg/dL (ref 8.9–10.3)
Chloride: 94 mmol/L — ABNORMAL LOW (ref 98–111)
Creatinine, Ser: 4.69 mg/dL — ABNORMAL HIGH (ref 0.44–1.00)
GFR, EST AFRICAN AMERICAN: 9 mL/min — AB (ref >60)
GFR, EST NON AFRICAN AMERICAN: 8 mL/min — AB (ref >60)
Glucose, Bld: 115 mg/dL — ABNORMAL HIGH (ref 70–99)
PHOSPHORUS: 6.7 mg/dL — AB (ref 2.5–4.6)
POTASSIUM: 4 mmol/L (ref 3.5–5.1)
SODIUM: 135 mmol/L (ref 135–145)

## 2018-04-11 LAB — GLUCOSE, CAPILLARY
GLUCOSE-CAPILLARY: 99 mg/dL (ref 70–99)
GLUCOSE-CAPILLARY: 234 mg/dL — AB (ref 70–99)
GLUCOSE-CAPILLARY: 131 mg/dL — AB (ref 70–99)

## 2018-04-11 LAB — CBC
HEMATOCRIT: 29.1 % — AB (ref 36.0–46.0)
HEMOGLOBIN: 9.1 g/dL — AB (ref 12.0–15.0)
MCH: 33.3 pg (ref 26.0–34.0)
MCHC: 31.3 g/dL (ref 30.0–36.0)
MCV: 106.6 fL — ABNORMAL HIGH (ref 78.0–100.0)
Platelets: 188 10*3/uL (ref 150–400)
RBC: 2.73 MIL/uL — ABNORMAL LOW (ref 3.87–5.11)
RDW: 14.5 % (ref 11.5–15.5)
WBC: 9.6 10*3/uL (ref 4.0–10.5)

## 2018-04-11 MED ORDER — HYDRALAZINE HCL 50 MG PO TABS
25.0000 mg | ORAL_TABLET | Freq: Three times a day (TID) | ORAL | Status: DC
  Administered 2018-04-11 – 2018-04-15 (×10): 25 mg via ORAL
  Filled 2018-04-11 (×12): qty 1

## 2018-04-11 MED ORDER — INSULIN ASPART 100 UNIT/ML ~~LOC~~ SOLN
2.0000 [IU] | Freq: Three times a day (TID) | SUBCUTANEOUS | Status: DC
  Administered 2018-04-11: 6 [IU] via SUBCUTANEOUS
  Administered 2018-04-12: 4 [IU] via SUBCUTANEOUS
  Administered 2018-04-12 – 2018-04-13 (×2): 6 [IU] via SUBCUTANEOUS
  Administered 2018-04-13: 4 [IU] via SUBCUTANEOUS
  Administered 2018-04-14 – 2018-04-15 (×2): 2 [IU] via SUBCUTANEOUS

## 2018-04-11 MED ORDER — SODIUM CHLORIDE 0.9 % IV SOLN: 100.0000 mL | INTRAVENOUS | Status: DC | PRN

## 2018-04-11 NOTE — Progress Notes (Signed)
Patient HR dropped to 40's asymptomatic BP 101/41 with 2.2 sec pause. EKG done. I notified cardiology MD to update him about patient and for him to review EKG. I will continue to monitor.

## 2018-04-11 NOTE — Progress Notes (Signed)
[facility] NEPHROLOGY PROGRESS NOTE  Assessment/ Plan: Pt is a 77 y.o. yo female ESRD, cardiac arrest at home  Dialysis:[facility]  4h 76kg 3K/ 2Ca bath LUA AVG Hep none - mircera 100 q 2  #VF arrest: Positive troponin echo with new wall motion abnormalities.  S/p cath on 9/25 with stent placement. Now on aspirin and brilinta for at least 12 months. No chest pain or SOB today.  Bradycardia yesterday and seen by EP.  On metoprolol.    # ESRD: TTS, dialysis today, tolerating well. Left upper extremity AV graft for access. Next HD Monday  # Anemia: Hemoglobin 8-9. Iron sat 15 %, ferritin 1432, on weekly nulecit.   # Secondary hyperparathyroidism: ca ok. Phos 6.7, monitor   # HTN/volume: BP on lower side, I will reduce hydralazine to 25,  3 times daily.  Also on metoprolol. Volume status looks acceptable.    Subjective: Seen and examined during dialysis.  Tolerating well.  Denies chest pain, shortness of breath. Objective Vital signs in last 24 hours: Vitals:   04/11/18 0421 04/11/18 0815 04/11/18 0828 04/11/18 0900  BP: (!) 136/49 (!) 148/50 (!) 142/54 (!) 106/33  Pulse: 67 69 (!) 59 (!) 59  Resp: 16 (!) 22 (!) 23   Temp: 98.1 F (36.7 C) 98 F (36.7 C) 98 F (36.7 C)   TempSrc: Oral Oral Oral   SpO2: 97% 96% 97% 96%  Weight:  77.7 kg    Height:       Weight change:  No intake or output data in the 24 hours ending 04/11/18 0939     Labs: Basic Metabolic Panel: Recent Labs  Lab 04/09/18 1922 04/10/18 0324 04/11/18 0844  NA 138 138 135  K 3.9 3.9 4.0  CL 96* 98 94*  CO2 28 29 25   GLUCOSE 119* 124* 115*  BUN 15 19 34*  CREATININE 2.49* 3.13* 4.69*  CALCIUM 8.7* 8.7* 8.9  PHOS  --   --  6.7*   Liver Function Tests: Recent Labs  Lab 04/11/18 0844  ALBUMIN 2.2*   No results for input(s): LIPASE, AMYLASE in the last 168 hours. No results for input(s): AMMONIA in the last 168 hours. CBC: Recent Labs  Lab 04/07/18 0535 04/08/18 0414  04/09/18 0319 04/10/18 0324 04/11/18 0844  WBC 9.1 8.9 10.1 9.6 9.6  HGB 9.4* 8.8* 9.4* 9.7* 9.1*  HCT 29.3* 28.3* 30.1* 30.7* 29.1*  MCV 106.2* 107.6* 106.4* 107.3* 106.6*  PLT 166 156 174 171 188   Cardiac Enzymes: No results for input(s): CKTOTAL, CKMB, CKMBINDEX, TROPONINI in the last 168 hours. CBG: Recent Labs  Lab 04/09/18 2102 04/10/18 0516 04/10/18 1051 04/10/18 1739 04/10/18 2136  GLUCAP 76 98 165* 140* 213*    Iron Studies:  No results for input(s): IRON, TIBC, TRANSFERRIN, FERRITIN in the last 72 hours. Studies/Results: Dg Chest 2 View  Result Date: 04/10/2018 CLINICAL DATA:  ESRD on HD TThS, coronary disease, HTN, and IDDM who presented with VF arrest. Not cooled because was able to interact immediately after resuscitation. Also diarrhea on admission, Cdiff antigen positive and suspected aspiration pneumonia. EXAM: CHEST - 2 VIEW COMPARISON:  04/03/2018 FINDINGS: Status post median sternotomy and CABG. The heart is mildly enlarged. No pulmonary edema. There is focal opacity in the LEFT lung base, associated with small pleural effusion, consistent with early infiltrate or atelectasis. IMPRESSION: 1. LEFT LOWER lobe infiltrate/atelectasis and small effusion. 2. Cardiomegaly.  No edema. Electronically Signed   By: Nolon Nations M.D.  On: 04/10/2018 13:44    Medications: Infusions: . sodium chloride    . ferric gluconate (FERRLECIT/NULECIT) IV 125 mg (04/07/18 1945)    Scheduled Medications: . aspirin  81 mg Oral Daily  . atorvastatin  40 mg Oral q1800  . B-complex with vitamin C  1 tablet Per Tube Daily  . brimonidine  1 drop Left Eye TID  . Chlorhexidine Gluconate Cloth  6 each Topical Q0600  . Chlorhexidine Gluconate Cloth  6 each Topical Q0600  . Chlorhexidine Gluconate Cloth  6 each Topical Q0600  . darbepoetin (ARANESP) injection - DIALYSIS  100 mcg Intravenous Q Thu-HD  . feeding supplement  1 Container Oral TID BM  . heparin  5,000 Units  Subcutaneous Q8H  . hydrALAZINE  50 mg Oral Q8H  . insulin aspart  2-6 Units Subcutaneous TID WC  . insulin glargine  15 Units Subcutaneous Daily  . latanoprost  1 drop Both Eyes QHS  . mouth rinse  15 mL Mouth Rinse BID  . metoprolol tartrate  50 mg Oral BID  . ticagrelor  90 mg Oral BID  . timolol  1 drop Left Eye Daily  . vancomycin  125 mg Oral QID    have reviewed scheduled and prn medications.  Physical Exam: General: Lying in bed comfortable, not in distress Heart:RRR, s1s2 nl Lungs: Clear bilateral, good air entry Abdomen: Soft, nontender, nondistended Extremities: No edema Dialysis Access: Left upper extremity AV graft has good bruit.  Dron Prasad Bhandari 04/11/2018,9:39 AM  LOS: 9 days

## 2018-04-11 NOTE — Progress Notes (Signed)
Notified MD to update him that patient HR 20-30's non sustain asymptomatic with  BP 101/41. I will continue to monitor.

## 2018-04-11 NOTE — Progress Notes (Signed)
PROGRESS NOTE    [name]  RCV:[id number] DOB: 07-23-1940 DOA: 04/02/2018 PCP: Josetta Huddle, MD    Brief Narrative:  [name] is a 77 y.o. F with ESRD on HD TThS, coronary disease, HTN, and IDDM who presented with VF arrest.  Not cooled because was able to interact immediately after resuscitation.  Also diarrhea on admission, Cdiff antigen positive and suspected aspiration pneumonia.   Subjective: feeling better today, cough is not as bad  Objective: Vitals:   04/10/18 0127 04/10/18 0200 04/10/18 0500 04/10/18 0707  BP: (!) 159/30 (!) 172/41 (!) 186/83 (!) 184/83  Pulse: 72   64  Resp: (!) 24 20 15 20   Temp: 98.4 F (36.9 C)  97.8 F (36.6 C) 98.7 F (37.1 C)  TempSrc: Oral  Oral Oral  SpO2: 97% 97% 97% 97%  Weight: 57 kg     Height:        Examination: General appearance: Obese adult female, lying in bed, on dialysis.  No obvious distress. Skin: Warm and dry.  No jaundice.   Cardiac: Heart rate normal, no murmurs appreciated, JVP not visible, no lower extremity edema. Respiratory: R base dec's BS/ Rales, some bronch BS. L side clear Abdomen: No abdominal discomfort or tenderness to palpation or guarding.  No distention. MSK: No deformities or effusions in the large joints of the upper or lower extremities.. Neuro: oriented to place, day of week, not to year, follows commands.  Moves all extremities with global weakness, symmetric coordination.    Assessment & Plan:  VF arrest 12 minutes OOH CPR.  ROSC by time of arrival to ER.  Not cooled due to ability to interact on arrival to hospital.  Echo now with reduced EF, RWMA.  Got 2d heparin gtt.  Left heart cath 9/26 with DES to SVG to PLA. -Continue aspirin and Brilinta for 12 months -Continue atorvastatin - Metoprolol will be held , see below  VTach/ torsades, non-sustained: per cardiology , this occurred within 48 hr post PCI window and prob represents reperfusion. None further yesterday. - appreciate  cardiology / EP assistance - avoid QT prolonging meds - metoprolol resumed  Bradycardia - per cards likely this was a vagal episode, ok to resume BB  Hypertension -- cont hydralazine -- metoprolol resumed by cards at 50 mg bid  C. difficile diarrhea Profuse foul-smelling diarrhea at admission.  C. difficile antigen positive, toxin negative. -Continue oral Vanc 125 qid, day 9 of 10 -- still has rectal tube in place, nothing in I/O's suggesting OP is large, will dw nurse  Aspiration pneumonia - Completed 5 days of Zosyn.  WBC resolved.  - recurrent cough > got CXR  yest showed L base infiltrate/ atx on lateral film, however no fever/ ^wbc and with Cdif will hold on restarting antibiotics  ESRD on HD -Continue nephrology, appreciate care  Diabetes Glucose well controlled -Continue Glargine 15 qd -Continue SSI  Anemia of chronic renal insufficiency No change to Hgb, Hb 9 -10 range, darbe 100 IV weekly as esa + IV Fe load per renal   Thrombocytopenia Resolved  Prolonged QTc -SSRI dc'd   Debra Splinter MD [facility] pgr [phone] 12/07/2017, 9:25 AM     DVT prophylaxis: Heparin Code Status: FULL Family Communication: no family here this am Disposition: currently in evaluation for CIR     Consultants:   Cardiology  Nephrology  CCM  Procedures:   Intubation 9/20  Extubation 9/21  Echocardiogram 9/20 LV EF: 45% -  50%  ------------------------------------------------------------------- Indications:      Cardiac arrest 427.5.  ------------------------------------------------------------------- History:   PMH:  Chronic kidney disease.  Atrial fibrillation. Coronary artery disease.  Congestive heart failure.  Risk factors: Hypertension. Dyslipidemia.  ------------------------------------------------------------------- Study Conclusions  - Left ventricle: The cavity size was normal. Wall thickness was   increased in a pattern of  mild LVH. Systolic function was mildly   reduced. The estimated ejection fraction was in the range of 45%   to 50%. There is akinesis of the basal-midinferolateral and   inferior myocardium. Doppler parameters are consistent with   abnormal left ventricular relaxation (grade 1 diastolic   dysfunction). - Mitral valve: Calcified annulus. There was mild regurgitation.  Impressions:  - Akinesis of the basal/mid inferior and inferolateral walls with   overall mildly reduced LV systolic function; mild diastolic   dysfunction; mild LVH; mild MR.   Angiography 9/25  Ost 1st Mrg lesion is 75% stenosed.  Prox Cx lesion is 100% stenosed. Left to left collaterals. Small AV groove circumflex.  Ost 1st Diag lesion is 100% stenosed. SVG to diag is patent.  Prox LAD lesion is 95% stenosed. LIMA to LAD is patent.  Prox RCA lesion is 95% stenosed. Mid Graft lesion is 95% stenosed in SVG to PLA.  A drug-eluting stent was successfully placed using a STENT RESOLUTE ONYX 2.0X15.  Post intervention, there is a 0% residual stenosis.  LV end diastolic pressure is normal.  There is no aortic valve stenosis.  Calcified femoral artery.     Recommend uninterrupted dual antiplatelet therapy with Aspirin 81mg  daily and Ticagrelor 90mg  twice daily for a minimum of 12 months (ACS - Class I recommendation).       Antimicrobials:   Zosyn 9/19 >> 9/24  Oral vancomycin 9/20 >>        Data Reviewed: I have personally reviewed following labs and imaging studies:  CBC: Recent Labs  Lab 04/07/18 0535 04/08/18 0414 04/09/18 0319 04/10/18 0324 04/11/18 0844  WBC 9.1 8.9 10.1 9.6 9.6  HGB 9.4* 8.8* 9.4* 9.7* 9.1*  HCT 29.3* 28.3* 30.1* 30.7* 29.1*  MCV 106.2* 107.6* 106.4* 107.3* 106.6*  PLT 166 156 174 171 449   Basic Metabolic Panel: Recent Labs  Lab 04/08/18 0414 04/09/18 0319 04/09/18 1922 04/10/18 0324 04/10/18 1146 04/11/18 0844  NA 137 136 138 138  --  135  K 3.6  3.6 3.9 3.9  --  4.0  CL 95* 98 96* 98  --  94*  CO2 25 24 28 29   --  25  GLUCOSE 156* 136* 119* 124*  --  115*  BUN 32* 45* 15 19  --  34*  CREATININE 3.73* 4.92* 2.49* 3.13*  --  4.69*  CALCIUM 8.2* 8.8* 8.7* 8.7*  --  8.9  MG  --   --  2.0  --  2.0  --   PHOS  --   --   --   --   --  6.7*   GFR: Estimated Creatinine Clearance: 9.8 mL/min (A) (by C-G formula based on SCr of 4.69 mg/dL (H)). Liver Function Tests: Recent Labs  Lab 04/11/18 0844  ALBUMIN 2.2*   No results for input(s): LIPASE, AMYLASE in the last 168 hours. No results for input(s): AMMONIA in the last 168 hours. Coagulation Profile: No results for input(s): INR, PROTIME in the last 168 hours. Cardiac Enzymes: No results for input(s): CKTOTAL, CKMB, CKMBINDEX, TROPONINI in the last 168 hours. BNP (last 3 results) No results  for input(s): PROBNP in the last 8760 hours. HbA1C: No results for input(s): HGBA1C in the last 72 hours. CBG: Recent Labs  Lab 04/10/18 0516 04/10/18 1051 04/10/18 1739 04/10/18 2136 04/11/18 1248  GLUCAP 98 165* 140* 213* 99   Lipid Profile: No results for input(s): CHOL, HDL, LDLCALC, TRIG, CHOLHDL, LDLDIRECT in the last 72 hours. Thyroid Function Tests: No results for input(s): TSH, T4TOTAL, FREET4, T3FREE, THYROIDAB in the last 72 hours. Anemia Panel: No results for input(s): VITAMINB12, FOLATE, FERRITIN, TIBC, IRON, RETICCTPCT in the last 72 hours. Urine analysis:    Component Value Date/Time   COLORURINE YELLOW 12/21/2015 2222   APPEARANCEUR CLEAR 12/21/2015 2222   LABSPEC 1.013 12/21/2015 2222   PHURINE 6.5 12/21/2015 2222   GLUCOSEU NEGATIVE 12/21/2015 2222   HGBUR MODERATE (A) 12/21/2015 2222   BILIRUBINUR NEGATIVE 12/21/2015 2222   KETONESUR NEGATIVE 12/21/2015 2222   PROTEINUR >300 (A) 12/21/2015 2222   UROBILINOGEN 0.2 04/19/2015 1248   NITRITE POSITIVE (A) 12/21/2015 2222   LEUKOCYTESUR TRACE (A) 12/21/2015 2222   Sepsis  Labs: @LABRCNTIP (procalcitonin:4,lacticacidven:4)  ) Recent Results (from the past 240 hour(s))  Culture, blood (routine x 2)     Status: None   Collection Time: 04/02/18  8:00 PM  Result Value Ref Range Status   Specimen Description BLOOD LEFT UPPER ARM  Final   Special Requests   Final    BOTTLES DRAWN AEROBIC ONLY Blood Culture results may not be optimal due to an inadequate volume of blood received in culture bottles   Culture   Final    NO GROWTH 5 DAYS Performed at [facility], [place] [address] [place], [place] [address]    Report Status 04/07/2018 FINAL  Final  Culture, blood (routine x 2)     Status: None   Collection Time: 04/02/18  8:37 PM  Result Value Ref Range Status   Specimen Description BLOOD RIGHT ANTECUBITAL  Final   Special Requests   Final    BOTTLES DRAWN AEROBIC AND ANAEROBIC Blood Culture results may not be optimal due to an inadequate volume of blood received in culture bottles   Culture   Final    NO GROWTH 5 DAYS Performed at [facility], [place] [address] [place], [place] [address]    Report Status 04/07/2018 FINAL  Final  MRSA PCR Screening     Status: None   Collection Time: 04/02/18 11:37 PM  Result Value Ref Range Status   MRSA by PCR NEGATIVE NEGATIVE Final    Comment:        The GeneXpert MRSA Assay (FDA approved for NASAL specimens only), is one component of a comprehensive MRSA colonization surveillance program. It is not intended to diagnose MRSA infection nor to guide or monitor treatment for MRSA infections. Performed at [facility], [place] [address], [place], [place] [address]   C difficile quick scan w PCR reflex     Status: Abnormal   Collection Time: 04/03/18  2:29 AM  Result Value Ref Range Status   C Diff antigen POSITIVE (A) NEGATIVE Final   C Diff toxin NEGATIVE NEGATIVE Final   C Diff interpretation Results are indeterminate. See PCR results.  Final    Comment: Performed at [facility], [place]  [address] [place],  [address]  C. Diff by PCR, Reflexed     Status: Abnormal   Collection Time: 04/03/18  2:29 AM  Result Value Ref Range Status   Toxigenic C. Difficile by  PCR POSITIVE (A) NEGATIVE Final    Comment: Positive for toxigenic C. difficile with little to no toxin production. Only treat if clinical presentation suggests symptomatic illness. Performed at [facility], [place] [address], [place], [place] [address]   Culture, respiratory (non-expectorated)     Status: None   Collection Time: 04/03/18  3:25 AM  Result Value Ref Range Status   Specimen Description TRACHEAL ASPIRATE  Final   Special Requests NONE  Final   Gram Stain   Final    MODERATE WBC PRESENT, PREDOMINANTLY MONONUCLEAR FEW GRAM POSITIVE COCCI FEW YEAST    Culture   Final    Consistent with normal respiratory flora. Performed at [facility], [place] [address], [place], [place] [address]    Report Status 04/05/2018 FINAL  Final         Radiology Studies: Dg Chest 2 View  Result Date: 04/10/2018 CLINICAL DATA:  ESRD on HD TThS, coronary disease, HTN, and IDDM who presented with VF arrest. Not cooled because was able to interact immediately after resuscitation. Also diarrhea on admission, Cdiff antigen positive and suspected aspiration pneumonia. EXAM: CHEST - 2 VIEW COMPARISON:  04/03/2018 FINDINGS: Status post median sternotomy and CABG. The heart is mildly enlarged. No pulmonary edema. There is focal opacity in the LEFT lung base, associated with small pleural effusion, consistent with early infiltrate or atelectasis. IMPRESSION: 1. LEFT LOWER lobe infiltrate/atelectasis and small effusion. 2. Cardiomegaly.  No edema. Electronically Signed   By: Nolon Nations M.D.   On: 04/10/2018 13:44        Scheduled Meds: . aspirin  81 mg Oral Daily  . atorvastatin  40 mg Oral q1800  . B-complex with vitamin C  1 tablet Per Tube Daily  . brimonidine  1 drop Left Eye TID  . Chlorhexidine Gluconate  Cloth  6 each Topical Q0600  . Chlorhexidine Gluconate Cloth  6 each Topical Q0600  . Chlorhexidine Gluconate Cloth  6 each Topical Q0600  . darbepoetin (ARANESP) injection - DIALYSIS  100 mcg Intravenous Q Thu-HD  . feeding supplement  1 Container Oral TID BM  . heparin  5,000 Units Subcutaneous Q8H  . hydrALAZINE  25 mg Oral Q8H  . insulin aspart  2-6 Units Subcutaneous TID WC  . insulin glargine  15 Units Subcutaneous Daily  . latanoprost  1 drop Both Eyes QHS  . mouth rinse  15 mL Mouth Rinse BID  . metoprolol tartrate  50 mg Oral BID  . ticagrelor  90 mg Oral BID  . timolol  1 drop Left Eye Daily  . vancomycin  125 mg Oral QID   Continuous Infusions: . ferric gluconate (FERRLECIT/NULECIT) IV 125 mg (04/07/18 1945)     LOS: 9 days    Time spent: 25 minutes

## 2018-04-12 DIAGNOSIS — Z0189 Encounter for other specified special examinations: Secondary | ICD-10-CM

## 2018-04-12 DIAGNOSIS — R05 Cough: Secondary | ICD-10-CM

## 2018-04-12 LAB — BASIC METABOLIC PANEL
Anion gap: 12 (ref 5–15)
BUN: 22 mg/dL (ref 8–23)
CALCIUM: 8.9 mg/dL (ref 8.9–10.3)
CO2: 27 mmol/L (ref 22–32)
Chloride: 96 mmol/L — ABNORMAL LOW (ref 98–111)
Creatinine, Ser: 3.37 mg/dL — ABNORMAL HIGH (ref 0.44–1.00)
GFR, EST AFRICAN AMERICAN: 14 mL/min — AB (ref >60)
GFR, EST NON AFRICAN AMERICAN: 12 mL/min — AB (ref >60)
Glucose, Bld: 115 mg/dL — ABNORMAL HIGH (ref 70–99)
POTASSIUM: 3.6 mmol/L (ref 3.5–5.1)
SODIUM: 135 mmol/L (ref 135–145)

## 2018-04-12 LAB — HEPATITIS B SURFACE ANTIGEN: HEP B S AG: NEGATIVE

## 2018-04-12 LAB — CBC
HEMATOCRIT: 30.3 % — AB (ref 36.0–46.0)
Hemoglobin: 9.4 g/dL — ABNORMAL LOW (ref 12.0–15.0)
MCH: 33.2 pg (ref 26.0–34.0)
MCHC: 31 g/dL (ref 30.0–36.0)
MCV: 107.1 fL — ABNORMAL HIGH (ref 78.0–100.0)
PLATELETS: 186 10*3/uL (ref 150–400)
RBC: 2.83 MIL/uL — ABNORMAL LOW (ref 3.87–5.11)
RDW: 14.4 % (ref 11.5–15.5)
WBC: 11.3 10*3/uL — AB (ref 4.0–10.5)

## 2018-04-12 LAB — GLUCOSE, CAPILLARY
GLUCOSE-CAPILLARY: 113 mg/dL — AB (ref 70–99)
GLUCOSE-CAPILLARY: 230 mg/dL — AB (ref 70–99)
GLUCOSE-CAPILLARY: 192 mg/dL — AB (ref 70–99)
GLUCOSE-CAPILLARY: 72 mg/dL (ref 70–99)

## 2018-04-12 NOTE — Progress Notes (Signed)
PROGRESS NOTE  [name] JSE:[id number] DOB: 07-Jan-1941 DOA: 04/02/2018 PCP: Josetta Huddle, MD  HPI/Recap of past 24 hours: [name] is a 77 y.o. F with ESRD on HD TThS, coronary disease, HTN, and IDDM who presented with VF arrest.  Not cooled because was able to interact immediately after resuscitation.  Also diarrhea on admission, Cdiff antigen positive and suspected aspiration pneumonia.   04/12/2018: Patient seen and examined at bedside.  Reports pleuritic pain which is reproducible when she takes a deep breath and on palpation.  No dyspnea and no palpitations.  Assessment/Plan: Active Problems:   Cardiac arrest (HCC)   QT prolongation   Acute respiratory failure with hypoxia (HCC)   Aspiration pneumonia due to gastric secretions (HCC)   NSTEMI (non-ST elevated myocardial infarction) ([place])   Aspiration into airway  V. fib arrest ROSC time of arrival.  Not cooled due to ability to interact on arrival to the hospital Post left heart cath on 04/09/2018 with stent placement Continue aspirin and Brilinta for 12 months Continue atorvastatin Continue beta-blocker  Nonsustained V. Tach/torsades May represent reperfusion per cardiology Cardiology/electrophysiology following Continue metoprolol  C. difficile diarrhea Continues to have profuse foul-smelling diarrhea Continue oral vancomycin 125 4 times daily 10 out of 10 Continue rectal tube  QTC prolongation Twelve-lead EKG done on 04/11/2018 revealed QTC 525 Avoid QTC prolonging agents/ Obtain twelve-lead EKG in the morning  Type 2 diabetes Continue Lantus and insulin sliding scale -A1c  Hypertension Continue Lopressor  Aspirin pneumonia Has completed 5 days of Zosyn  Anemia of chronic disease Hemoglobin stable On Aranesp and iron supplement per nephrology No sign of overt bleeding Repeat CBC in the morning   Code Status: Full code  Family Communication: None at bedside  Disposition Plan: Home possibly  tomorrow or when cardiology and nephrology sign off.   Consultants:  Cardiology  Nephrology  Electrophysiology  Procedures:  None  Antimicrobials:  P.o. vancomycin  DVT prophylaxis: Subcu heparin 3 times daily   Objective: Vitals:   04/11/18 2104 04/12/18 0011 04/12/18 0500 04/12/18 0827  BP: (!) 119/29 (!) 142/36 (!) 150/44 (!) 125/38  Pulse: (!) 55 63    Resp:  15 (!) 21 18  Temp:  98.1 F (36.7 C) 98 F (36.7 C) 97.8 F (36.6 C)  TempSrc:  Oral Oral Oral  SpO2:  95% 98%   Weight:   73.6 kg   Height:        Intake/Output Summary (Last 24 hours) at 04/12/2018 1216 Last data filed at 04/12/2018 0800 Gross per 24 hour  Intake 360 ml  Output 2600 ml  Net -2240 ml   Filed Weights   04/11/18 0815 04/11/18 1229 04/12/18 0500  Weight: 77.7 kg 75.2 kg 73.6 kg    Exam:  . General: 77 y.o. year-old female well developed well nourished in no acute distress.  Alert and oriented x3. . Cardiovascular: Regular rate and rhythm with no rubs or gallops.  No thyromegaly or JVD noted.   Marland Kitchen Respiratory: Clear to auscultation with no wheezes or rales. Good inspiratory effort. . Abdomen: Soft nontender nondistended with normal bowel sounds x4 quadrants. . Musculoskeletal: No lower extremity edema. 2/4 pulses in all 4 extremities. . Skin: No ulcerative lesions noted or rashes, . Psychiatry: Mood is appropriate for condition and setting   Data Reviewed: CBC: Recent Labs  Lab 04/08/18 0414 04/09/18 0319 04/10/18 0324 04/11/18 0844 04/12/18 0910  WBC 8.9 10.1 9.6 9.6 11.3*  HGB 8.8* 9.4* 9.7* 9.1* 9.4*  HCT 28.3* 30.1* 30.7* 29.1* 30.3*  MCV 107.6* 106.4* 107.3* 106.6* 107.1*  PLT 156 174 171 188 124   Basic Metabolic Panel: Recent Labs  Lab 04/09/18 0319 04/09/18 1922 04/10/18 0324 04/10/18 1146 04/11/18 0844 04/12/18 0910  NA 136 138 138  --  135 135  K 3.6 3.9 3.9  --  4.0 3.6  CL 98 96* 98  --  94* 96*  CO2 24 28 29   --  25 27  GLUCOSE 136* 119* 124*   --  115* 115*  BUN 45* 15 19  --  34* 22  CREATININE 4.92* 2.49* 3.13*  --  4.69* 3.37*  CALCIUM 8.8* 8.7* 8.7*  --  8.9 8.9  MG  --  2.0  --  2.0  --   --   PHOS  --   --   --   --  6.7*  --    GFR: Estimated Creatinine Clearance: 13.5 mL/min (A) (by C-G formula based on SCr of 3.37 mg/dL (H)). Liver Function Tests: Recent Labs  Lab 04/11/18 0844  ALBUMIN 2.2*   No results for input(s): LIPASE, AMYLASE in the last 168 hours. No results for input(s): AMMONIA in the last 168 hours. Coagulation Profile: No results for input(s): INR, PROTIME in the last 168 hours. Cardiac Enzymes: No results for input(s): CKTOTAL, CKMB, CKMBINDEX, TROPONINI in the last 168 hours. BNP (last 3 results) No results for input(s): PROBNP in the last 8760 hours. HbA1C: No results for input(s): HGBA1C in the last 72 hours. CBG: Recent Labs  Lab 04/10/18 2136 04/11/18 1248 04/11/18 1700 04/11/18 2153 04/12/18 0824  GLUCAP 213* 99 234* 131* 113*   Lipid Profile: No results for input(s): CHOL, HDL, LDLCALC, TRIG, CHOLHDL, LDLDIRECT in the last 72 hours. Thyroid Function Tests: No results for input(s): TSH, T4TOTAL, FREET4, T3FREE, THYROIDAB in the last 72 hours. Anemia Panel: No results for input(s): VITAMINB12, FOLATE, FERRITIN, TIBC, IRON, RETICCTPCT in the last 72 hours. Urine analysis:    Component Value Date/Time   COLORURINE YELLOW 12/21/2015 2222   APPEARANCEUR CLEAR 12/21/2015 2222   LABSPEC 1.013 12/21/2015 2222   PHURINE 6.5 12/21/2015 2222   GLUCOSEU NEGATIVE 12/21/2015 2222   HGBUR MODERATE (A) 12/21/2015 2222   BILIRUBINUR NEGATIVE 12/21/2015 2222   KETONESUR NEGATIVE 12/21/2015 2222   PROTEINUR >300 (A) 12/21/2015 2222   UROBILINOGEN 0.2 04/19/2015 1248   NITRITE POSITIVE (A) 12/21/2015 2222   LEUKOCYTESUR TRACE (A) 12/21/2015 2222   Sepsis Labs: @LABRCNTIP (procalcitonin:4,lacticidven:4)  ) Recent Results (from the past 240 hour(s))  Culture, blood (routine x 2)      Status: None   Collection Time: 04/02/18  8:00 PM  Result Value Ref Range Status   Specimen Description BLOOD LEFT UPPER ARM  Final   Special Requests   Final    BOTTLES DRAWN AEROBIC ONLY Blood Culture results may not be optimal due to an inadequate volume of blood received in culture bottles   Culture   Final    NO GROWTH 5 DAYS Performed at [facility], [place] [address] [place], [place] [address]    Report Status 04/07/2018 FINAL  Final  Culture, blood (routine x 2)     Status: None   Collection Time: 04/02/18  8:37 PM  Result Value Ref Range Status   Specimen Description BLOOD RIGHT ANTECUBITAL  Final   Special Requests   Final    BOTTLES DRAWN AEROBIC AND ANAEROBIC Blood Culture results may not be optimal due to an  inadequate volume of blood received in culture bottles   Culture   Final    NO GROWTH 5 DAYS Performed at [facility], [place] [address] [place], [place] [address]    Report Status 04/07/2018 FINAL  Final  MRSA PCR Screening     Status: None   Collection Time: 04/02/18 11:37 PM  Result Value Ref Range Status   MRSA by PCR NEGATIVE NEGATIVE Final    Comment:        The GeneXpert MRSA Assay (FDA approved for NASAL specimens only), is one component of a comprehensive MRSA colonization surveillance program. It is not intended to diagnose MRSA infection nor to guide or monitor treatment for MRSA infections. Performed at [facility], [place] [address] [place], [place] [address]   C difficile quick scan w PCR reflex     Status: Abnormal   Collection Time: 04/03/18  2:29 AM  Result Value Ref Range Status   C Diff antigen POSITIVE (A) NEGATIVE Final   C Diff toxin NEGATIVE NEGATIVE Final   C Diff interpretation Results are indeterminate. See PCR results.  Final    Comment: Performed at [facility], [place] [address], [place], [place] [address]  C. Diff by PCR, Reflexed     Status: Abnormal   Collection Time: 04/03/18  2:29 AM  Result Value Ref  Range Status   Toxigenic C. Difficile by PCR POSITIVE (A) NEGATIVE Final    Comment: Positive for toxigenic C. difficile with little to no toxin production. Only treat if clinical presentation suggests symptomatic illness. Performed at [facility], [place] [address], [place], [place] [address]   Culture, respiratory (non-expectorated)     Status: None   Collection Time: 04/03/18  3:25 AM  Result Value Ref Range Status   Specimen Description TRACHEAL ASPIRATE  Final   Special Requests NONE  Final   Gram Stain   Final    MODERATE WBC PRESENT, PREDOMINANTLY MONONUCLEAR FEW GRAM POSITIVE COCCI FEW YEAST    Culture   Final    Consistent with normal respiratory flora. Performed at [facility], [place] [address], [place], [place] [address]    Report Status 04/05/2018 FINAL  Final      Studies: No results found.  Scheduled Meds: . aspirin  81 mg Oral Daily  . atorvastatin  40 mg Oral q1800  . B-complex with vitamin C  1 tablet Per Tube Daily  . brimonidine  1 drop Left Eye TID  . darbepoetin (ARANESP) injection - DIALYSIS  100 mcg Intravenous Q Thu-HD  . feeding supplement  1 Container Oral TID BM  . heparin  5,000 Units Subcutaneous Q8H  . hydrALAZINE  25 mg Oral Q8H  . insulin aspart  2-6 Units Subcutaneous TID WC  . insulin glargine  15 Units Subcutaneous Daily  . latanoprost  1 drop Both Eyes QHS  . mouth rinse  15 mL Mouth Rinse BID  . metoprolol tartrate  50 mg Oral BID  . ticagrelor  90 mg Oral BID  . timolol  1 drop Left Eye Daily  . vancomycin  125 mg Oral QID    Continuous Infusions: . ferric gluconate (FERRLECIT/NULECIT) IV 125 mg (04/07/18 1945)     LOS: 10 days     Kayleen Memos, MD [facility] Pager [phone]  If 7PM-7AM, please contact night-coverage [email or web address] Password [facility] 04/12/2018, 12:16 PM

## 2018-04-12 NOTE — Progress Notes (Signed)
Pt had a 6.22 second pause and HR dropped to 30s. Pt is asymptomatic and was unaware of what was happening. MD notified. Will continue to monitor.

## 2018-04-12 NOTE — Progress Notes (Signed)
[name]  Assessment/ Plan: Pt is a 77 y.o. yo female ESRD, cardiac arrest at home  Dialysis:[facility]  4h 76kg 3K/ 2Ca bath LUA AVG Hep none - mircera 100 q 2  #VF arrest: Positive troponin echo with new wall motion abnormalities.  S/p cath on 9/25 with stent placement. Now on aspirin and brilinta for at least 12 months. No chest pain or SOB today.  Seen by EP for bradycardia, resume metoprolol.   # ESRD: TTS, had dialysis yesterday with 2.5 L ultrafiltration.  Tolerated well.  Plan for next dialysis on Tuesday.  Left upper extremity AV graft for access.   # Anemia: Hemoglobin around 9. Iron sat 15 %, ferritin 1432, on weekly nulecit. On mircera.  # Secondary hyperparathyroidism: monitor ca, phos,   # HTN/volume: BP acceptable.  On metoprolol and hydralazine.  Volume status looks acceptable.    Subjective: Seen and examined at bedside.  Denies chest pain, shortness of breath.  Has some dry cough.  No nausea vomiting.   Objective Vital signs in last 24 hours: Vitals:   04/11/18 2104 04/12/18 0011 04/12/18 0500 04/12/18 0827  BP: (!) 119/29 (!) 142/36 (!) 150/44 (!) 125/38  Pulse: (!) 55 63    Resp:  15 (!) 21 18  Temp:  98.1 F (36.7 C) 98 F (36.7 C) 97.8 F (36.6 C)  TempSrc:  Oral Oral Oral  SpO2:  95% 98%   Weight:   73.6 kg   Height:       Weight change:   Intake/Output Summary (Last 24 hours) at 04/12/2018 0931 Last data filed at 04/12/2018 0800 Gross per 24 hour  Intake 360 ml  Output 2600 ml  Net -2240 ml       Labs: Basic Metabolic Panel: Recent Labs  Lab 04/09/18 1922 04/10/18 0324 04/11/18 0844  NA 138 138 135  K 3.9 3.9 4.0  CL 96* 98 94*  CO2 28 29 25   GLUCOSE 119* 124* 115*  BUN 15 19 34*  CREATININE 2.49* 3.13* 4.69*  CALCIUM 8.7* 8.7* 8.9  PHOS  --   --  6.7*   Liver Function Tests: Recent Labs  Lab 04/11/18 0844  ALBUMIN 2.2*   No results for input(s): LIPASE, AMYLASE in the last  168 hours. No results for input(s): AMMONIA in the last 168 hours. CBC: Recent Labs  Lab 04/07/18 0535 04/08/18 0414 04/09/18 0319 04/10/18 0324 04/11/18 0844  WBC 9.1 8.9 10.1 9.6 9.6  HGB 9.4* 8.8* 9.4* 9.7* 9.1*  HCT 29.3* 28.3* 30.1* 30.7* 29.1*  MCV 106.2* 107.6* 106.4* 107.3* 106.6*  PLT 166 156 174 171 188   Cardiac Enzymes: No results for input(s): CKTOTAL, CKMB, CKMBINDEX, TROPONINI in the last 168 hours. CBG: Recent Labs  Lab 04/10/18 2136 04/11/18 1248 04/11/18 1700 04/11/18 2153 04/12/18 0824  GLUCAP 213* 99 234* 131* 113*    Iron Studies:  No results for input(s): IRON, TIBC, TRANSFERRIN, FERRITIN in the last 72 hours. Studies/Results: Dg Chest 2 View  Result Date: 04/10/2018 CLINICAL DATA:  ESRD on HD TThS, coronary disease, HTN, and IDDM who presented with VF arrest. Not cooled because was able to interact immediately after resuscitation. Also diarrhea on admission, Cdiff antigen positive and suspected aspiration pneumonia. EXAM: CHEST - 2 VIEW COMPARISON:  04/03/2018 FINDINGS: Status post median sternotomy and CABG. The heart is mildly enlarged. No pulmonary edema. There is focal opacity in the LEFT lung base, associated with small pleural effusion, consistent with early  infiltrate or atelectasis. IMPRESSION: 1. LEFT LOWER lobe infiltrate/atelectasis and small effusion. 2. Cardiomegaly.  No edema. Electronically Signed   By: Nolon Nations M.D.   On: 04/10/2018 13:44    Medications: Infusions: . ferric gluconate (FERRLECIT/NULECIT) IV 125 mg (04/07/18 1945)    Scheduled Medications: . aspirin  81 mg Oral Daily  . atorvastatin  40 mg Oral q1800  . B-complex with vitamin C  1 tablet Per Tube Daily  . brimonidine  1 drop Left Eye TID  . darbepoetin (ARANESP) injection - DIALYSIS  100 mcg Intravenous Q Thu-HD  . feeding supplement  1 Container Oral TID BM  . heparin  5,000 Units Subcutaneous Q8H  . hydrALAZINE  25 mg Oral Q8H  . insulin aspart  2-6  Units Subcutaneous TID WC  . insulin glargine  15 Units Subcutaneous Daily  . latanoprost  1 drop Both Eyes QHS  . mouth rinse  15 mL Mouth Rinse BID  . metoprolol tartrate  50 mg Oral BID  . ticagrelor  90 mg Oral BID  . timolol  1 drop Left Eye Daily  . vancomycin  125 mg Oral QID    have reviewed scheduled and prn medications.  Physical Exam: General: Lying in bed comfortable, not in distress Heart:RRR, s1s2 nl Lungs: Clear bilateral, good air entry, no wheezing Abdomen: Soft, nontender, nondistended Extremities: No edema Dialysis Access: Left upper extremity AV graft has good bruit.  Rainna Nearhood Prasad Atha Muradyan 04/12/2018,9:31 AM  LOS: 10 days

## 2018-04-13 DIAGNOSIS — I455 Other specified heart block: Secondary | ICD-10-CM

## 2018-04-13 LAB — GLUCOSE, CAPILLARY
GLUCOSE-CAPILLARY: 104 mg/dL — AB (ref 70–99)
GLUCOSE-CAPILLARY: 170 mg/dL — AB (ref 70–99)
GLUCOSE-CAPILLARY: 202 mg/dL — AB (ref 70–99)
Glucose-Capillary: 138 mg/dL — ABNORMAL HIGH (ref 70–99)

## 2018-04-13 MED ORDER — METOPROLOL TARTRATE 50 MG PO TABS: 50.0000 mg | ORAL_TABLET | Freq: Two times a day (BID) | ORAL | Status: DC

## 2018-04-13 MED ORDER — NEBIVOLOL HCL 5 MG PO TABS
2.5000 mg | ORAL_TABLET | Freq: Every day | ORAL | Status: DC
  Administered 2018-04-13 – 2018-04-14 (×2): 2.5 mg via ORAL
  Filled 2018-04-13 (×2): qty 1

## 2018-04-13 MED ORDER — CHLORHEXIDINE GLUCONATE CLOTH 2 % EX PADS
6.0000 | MEDICATED_PAD | Freq: Every day | CUTANEOUS | Status: DC
  Administered 2018-04-14 – 2018-04-15 (×3): 6 via TOPICAL

## 2018-04-13 NOTE — Progress Notes (Signed)
Inpatient Rehabilitation Admissions Coordinator  Noted addition of bystolic today and 0.37 second pause and HR dropped to 30's last pm. Flexiseal remains. I will continue to follow as we as we await medical readiness to d/c to inpt rehab. Noted palliative consult.  Danne Baxter, RN, MSN Rehab Admissions Coordinator [phone] 04/13/2018 1:03 PM

## 2018-04-13 NOTE — Progress Notes (Signed)
Met w/ pt and family to discuss EOL issues, pt is a DNR at OP HD, I didn't realize this but they have paperwork here so I will change to DNR.  Pt will continue HD for now and she knows that she can stop at anytime if she gets tired of it.  Pt's daughter doesn't like hospice due to prior bad experience, she says when its' time they will take care of her and she can die at home.  They don't want to meet w/ palliative or hospice prior to dc, will cancel consult.     [name] MD [employer] pgr [phone]   04/13/2018, 5:13 PM

## 2018-04-13 NOTE — Plan of Care (Signed)
  Problem: Education: Goal: Knowledge of General Education information will improve Description Including pain rating scale, medication(s)/side effects and non-pharmacologic comfort measures Outcome: Progressing   Problem: Clinical Measurements: Goal: Ability to maintain clinical measurements within normal limits will improve Outcome: Progressing Goal: Will remain free from infection Outcome: Progressing Goal: Diagnostic test results will improve Outcome: Progressing Goal: Cardiovascular complication will be avoided Outcome: Progressing   Problem: Education: Goal: Understanding of cardiac disease, CV risk reduction, and recovery process will improve Outcome: Progressing Goal: Understanding of medication regimen will improve Outcome: Progressing   Problem: Activity: Goal: Ability to tolerate increased activity will improve Outcome: Progressing   Problem: Health Behavior/Discharge Planning: Goal: Ability to safely manage health-related needs after discharge will improve Outcome: Progressing

## 2018-04-13 NOTE — Progress Notes (Addendum)
PROGRESS NOTE        PATIENT DETAILS Name: [name] Age: 77 y.o. Sex: female Date of Birth: 10/03/1940 Admit Date: 04/02/2018 Admitting Physician Reyne Dumas, MD [id number], Herbie [place], MD  Brief Narrative: Patient is a 77 y.o. female with history of ESRD on HD, hypertension, IDDM-presented with a VF arrest.  Evaluated by cardiology, echo with reduced EF and regional wall motion abnormality, LHC performed on 9/26 with DES to SVG to PLA.  Hospital course complicated by aspiration pneumonia, C. difficile colitis, nonsustained V. tach, bradycardia and sinus pauses.  See below for further details  Subjective: Lying comfortably in bed-denies any chest pain, shortness of breath overnight.  Coughing spells have improved.  Patient did not have any lightheadedness or dizziness around the time she had the approximate 6-second pause last night.  Assessment/Plan: VF arrest: 12 minutes of outside hospital CPR, ROSC by time of arrival to ER.  Hypothermic protocol not required as patient was awake and alert.  TTE on 9/20 showed mildly reduced LV function (45% EF) and akinesia these of the basal/mid and inferolateral walls.  Evaluated by cards-underwent left heart cath and PCI on 9/26.  Continue aspirin/Brilinta and statin.  Nonsustained V. tach: Since this occurred within the 48-hour window post PCI-felt to be reperfusion mediated.  EP evaluation completed-recommendations are to avoid QT prolonging agents-continue telemetry monitoring.  Bradycardia with approximate 6-second pause last night: Per nursing staff-received metoprolol after a few days-bradycardia was felt to be a vagal response secondary to repeated episodes of coughing.  Metoprolol changed to bisoprolol by cardiology-discussed with Dr. Nishan-recommendations are to monitor closely.  C. difficile colitis: Has completed a course of vancomycin on 9/30-Per nursing staff-stools now getting more formed and leaking  around the Flexi-Seal-plan is to discontinue Flexi-Seal and follow.  Aspiration pneumonia: Completed 5 days of Zosyn.  Afebrile without any leukocytosis.  Prolonged QTc: Continue to avoid QTC prolonging agents including SSRI.  Follow magnesium and potassium.  Follow EKG periodically.  ESRD: HD TTS-nephrology following and directing care  Anemia: Secondary to CKD-nephrology dosing IV iron.  Insulin-dependent diabetes: CBG stable with Lantus and SSI.  Follow and adjust accordingly.  DVT Prophylaxis: Prophylactic heparin  Code Status: Full code   Family Communication: None at bedside  Disposition Plan: Remain inpatient-potential CIR on discharge over the next few days.  Antimicrobial agents: Anti-infectives (From admission, onward)   Start     Dose/Rate Route Frequency Ordered Stop   04/07/18 1000  vancomycin (VANCOCIN) 50 mg/mL oral solution 125 mg     125 mg Oral 4 times daily 04/07/18 0844 04/13/18 0959   04/04/18 1200  vancomycin (VANCOCIN) IVPB 1000 mg/200 mL premix  Status:  Discontinued     1,000 mg 200 mL/hr over 60 Minutes Intravenous Every T-Th-Sa (Hemodialysis) 04/02/18 2145 04/05/18 1144   04/04/18 1200  piperacillin-tazobactam (ZOSYN) IVPB 3.375 g     3.375 g 12.5 mL/hr over 240 Minutes Intravenous Every 12 hours 04/04/18 0922 04/08/18 0631   04/03/18 1200  vancomycin (VANCOCIN) 50 mg/mL oral solution 500 mg  Status:  Discontinued     500 mg Oral Every 6 hours 04/03/18 1017 04/07/18 0844   04/03/18 1000  vancomycin (VANCOCIN) 50 mg/mL oral solution 125 mg  Status:  Discontinued     125 mg Oral 4 times daily 04/03/18 0954 04/03/18 1017   04/03/18 0800  piperacillin-tazobactam (ZOSYN) IVPB 3.375 g  Status:  Discontinued     3.375 g 12.5 mL/hr over 240 Minutes Intravenous Every 12 hours 04/02/18 2145 04/04/18 0922   04/02/18 2030  piperacillin-tazobactam (ZOSYN) IVPB 3.375 g     3.375 g 100 mL/hr over 30 Minutes Intravenous  Once 04/02/18 2018 04/02/18 2200    04/02/18 2030  vancomycin (VANCOCIN) 1,500 mg in sodium chloride 0.9 % 500 mL IVPB     1,500 mg 250 mL/hr over 120 Minutes Intravenous  Once 04/02/18 2019 04/03/18 0305      Procedures: 9/25>> LHC:  Ost 1st Mrg lesion is 75% stenosed.  Prox Cx lesion is 100% stenosed. Left to left collaterals. Small AV groove circumflex.  Ost 1st Diag lesion is 100% stenosed. SVG to diag is patent.  Prox LAD lesion is 95% stenosed. LIMA to LAD is patent.  Prox RCA lesion is 95% stenosed. Mid Graft lesion is 95% stenosed in SVG to PLA.  A drug-eluting stent was successfully placed using a STENT RESOLUTE ONYX 2.0X15.  Post intervention, there is a 0% residual stenosis.  LV end diastolic pressure is normal.  There is no aortic valve stenosis.  Calcified femoral artery.  9/20>> echocardiogram:   Akinesis of the basal/mid inferior and inferolateral walls with overall mildly reduced LV systolic function; mild diastolic dysfunction; mild LVH; mild MR.  CONSULTS:  cardiology  CIR  Time spent: 25- minutes-Greater than 50% of this time was spent in counseling, explanation of diagnosis, planning of further management, and coordination of care.  MEDICATIONS: Scheduled Meds: . aspirin  81 mg Oral Daily  . atorvastatin  40 mg Oral q1800  . B-complex with vitamin C  1 tablet Per Tube Daily  . brimonidine  1 drop Left Eye TID  . [START ON 04/14/2018] Chlorhexidine Gluconate Cloth  6 each Topical Q0600  . darbepoetin (ARANESP) injection - DIALYSIS  100 mcg Intravenous Q Thu-HD  . feeding supplement  1 Container Oral TID BM  . heparin  5,000 Units Subcutaneous Q8H  . hydrALAZINE  25 mg Oral Q8H  . insulin aspart  2-6 Units Subcutaneous TID WC  . insulin glargine  15 Units Subcutaneous Daily  . latanoprost  1 drop Both Eyes QHS  . mouth rinse  15 mL Mouth Rinse BID  . nebivolol  2.5 mg Oral Daily  . ticagrelor  90 mg Oral BID  . timolol  1 drop Left Eye Daily   Continuous Infusions: .  ferric gluconate (FERRLECIT/NULECIT) IV 125 mg (04/07/18 1945)   PRN Meds:.acetaminophen, alum & mag hydroxide-simeth, bisacodyl, calcium carbonate, docusate, Gerhardt's butt cream, guaiFENesin-dextromethorphan, ondansetron (ZOFRAN) IV   PHYSICAL EXAM: Vital signs: Vitals:   04/13/18 0900 04/13/18 1000 04/13/18 1100 04/13/18 1211  BP:    (!) 126/50  Pulse: 66 (!) 58 75 60  Resp: (!) 27 (!) 22 (!) 22 17  Temp:    98.2 F (36.8 C)  TempSrc:    Oral  SpO2: 98% 98% 98% 98%  Weight:      Height:       Filed Weights   04/11/18 1229 04/12/18 0500 04/13/18 0500  Weight: 75.2 kg 73.6 kg 73.5 kg   Body mass index is 27.81 kg/m.   General appearance :Awake, alert, not in any distress.  Eyes:Pink conjunctiva HEENT: Atraumatic and Normocephalic Neck: supple Resp:Good air entry bilaterally, no added sounds  CVS: S1 S2 regular, no murmurs.  GI: Bowel sounds present, Non tender and not distended with no gaurding, rigidity or  rebound. Extremities: B/L Lower Ext shows no edema, both legs are warm to touch Neurology: Appears to have generalized weakness-nonfocal exam Musculoskeletal:No digital cyanosis Skin:No Rash, warm and dry Wounds:N/A  I have personally reviewed following labs and imaging studies  LABORATORY DATA: CBC: Recent Labs  Lab 04/08/18 0414 04/09/18 0319 04/10/18 0324 04/11/18 0844 04/12/18 0910  WBC 8.9 10.1 9.6 9.6 11.3*  HGB 8.8* 9.4* 9.7* 9.1* 9.4*  HCT 28.3* 30.1* 30.7* 29.1* 30.3*  MCV 107.6* 106.4* 107.3* 106.6* 107.1*  PLT 156 174 171 188 620    Basic Metabolic Panel: Recent Labs  Lab 04/09/18 0319 04/09/18 1922 04/10/18 0324 04/10/18 1146 04/11/18 0844 04/12/18 0910  NA 136 138 138  --  135 135  K 3.6 3.9 3.9  --  4.0 3.6  CL 98 96* 98  --  94* 96*  CO2 24 28 29   --  25 27  GLUCOSE 136* 119* 124*  --  115* 115*  BUN 45* 15 19  --  34* 22  CREATININE 4.92* 2.49* 3.13*  --  4.69* 3.37*  CALCIUM 8.8* 8.7* 8.7*  --  8.9 8.9  MG  --  2.0  --   2.0  --   --   PHOS  --   --   --   --  6.7*  --     GFR: Estimated Creatinine Clearance: 13.5 mL/min (A) (by C-G formula based on SCr of 3.37 mg/dL (H)).  Liver Function Tests: Recent Labs  Lab 04/11/18 0844  ALBUMIN 2.2*   No results for input(s): LIPASE, AMYLASE in the last 168 hours. No results for input(s): AMMONIA in the last 168 hours.  Coagulation Profile: No results for input(s): INR, PROTIME in the last 168 hours.  Cardiac Enzymes: No results for input(s): CKTOTAL, CKMB, CKMBINDEX, TROPONINI in the last 168 hours.  BNP (last 3 results) No results for input(s): PROBNP in the last 8760 hours.  HbA1C: No results for input(s): HGBA1C in the last 72 hours.  CBG: Recent Labs  Lab 04/12/18 1238 04/12/18 1610 04/12/18 2155 04/13/18 0751 04/13/18 1210  GLUCAP 230* 192* 72 104* 170*    Lipid Profile: No results for input(s): CHOL, HDL, LDLCALC, TRIG, CHOLHDL, LDLDIRECT in the last 72 hours.  Thyroid Function Tests: No results for input(s): TSH, T4TOTAL, FREET4, T3FREE, THYROIDAB in the last 72 hours.  Anemia Panel: No results for input(s): VITAMINB12, FOLATE, FERRITIN, TIBC, IRON, RETICCTPCT in the last 72 hours.  Urine analysis:    Component Value Date/Time   COLORURINE YELLOW 12/21/2015 2222   APPEARANCEUR CLEAR 12/21/2015 2222   LABSPEC 1.013 12/21/2015 2222   PHURINE 6.5 12/21/2015 2222   GLUCOSEU NEGATIVE 12/21/2015 2222   HGBUR MODERATE (A) 12/21/2015 2222   BILIRUBINUR NEGATIVE 12/21/2015 2222   KETONESUR NEGATIVE 12/21/2015 2222   PROTEINUR >300 (A) 12/21/2015 2222   UROBILINOGEN 0.2 04/19/2015 1248   NITRITE POSITIVE (A) 12/21/2015 2222   LEUKOCYTESUR TRACE (A) 12/21/2015 2222    Sepsis Labs: Lactic Acid, Venous    Component Value Date/Time   LATICACIDVEN 2.6 (HH) 04/03/2018 0054    MICROBIOLOGY: No results found for this or any previous visit (from the past 240 hour(s)).  RADIOLOGY STUDIES/RESULTS: Dg Chest 2 View  Result Date:  04/10/2018 CLINICAL DATA:  ESRD on HD TThS, coronary disease, HTN, and IDDM who presented with VF arrest. Not cooled because was able to interact immediately after resuscitation. Also diarrhea on admission, Cdiff antigen positive and suspected aspiration pneumonia. EXAM: CHEST - 2  VIEW COMPARISON:  04/03/2018 FINDINGS: Status post median sternotomy and CABG. The heart is mildly enlarged. No pulmonary edema. There is focal opacity in the LEFT lung base, associated with small pleural effusion, consistent with early infiltrate or atelectasis. IMPRESSION: 1. LEFT LOWER lobe infiltrate/atelectasis and small effusion. 2. Cardiomegaly.  No edema. Electronically Signed   By: Nolon Nations M.D.   On: 04/10/2018 13:44   Dg Abd 1 View  Result Date: 04/03/2018 CLINICAL DATA:  OG tube placement EXAM: ABDOMEN - 1 VIEW COMPARISON:  12/04/2015 FINDINGS: OG tube tip is in the distal stomach or proximal duodenum. Nonobstructive bowel gas pattern noted in the upper abdomen. IMPRESSION: OG tube tip in the distal stomach or proximal duodenum. Electronically Signed   By: Rolm Baptise M.D.   On: 04/03/2018 09:35   Ct Head Wo Contrast  Result Date: 04/02/2018 CLINICAL DATA:  Status post cardiac arrest with altered mental status EXAM: CT HEAD WITHOUT CONTRAST TECHNIQUE: Contiguous axial images were obtained from the base of the skull through the vertex without intravenous contrast. COMPARISON:  January 11, 2017 FINDINGS: Brain: There is age related volume loss. There is no intracranial mass, hemorrhage, extra-axial fluid collection, or midline shift. Slight periventricular small vessel disease is stable. There is no new gray-white compartment lesion. No evident acute infarct. Basal ganglia calcification is stable and felt to be physiologic in this age group. Vascular: No evident hyperdense vessel. There is calcification in the distal vertebral arteries and carotid siphon regions bilaterally. Skull: The bony calvarium appears intact.  There is extensive ossification in the falx, a stable finding. Sinuses/Orbits: There is mucosal thickening in the right maxillary antrum and to a much lesser extent in the left maxillary antrum. There is diffuse opacification of the left sphenoid sinus with extensive mucosal thickening in the right sphenoid sinus. There is a retention cyst in the posterior left ethmoid region. There is opacification of several other ethmoid air cells. Orbits appear symmetric bilaterally. Other: Mastoid air cells are clear. Hearing assist devices are present in each external auditory canal. There is probable cerumen in the right external auditory canal. There is advanced arthropathy in the temporomandibular joints with flattening of the mandibular condyles bilaterally. There is intra-articular calcification in the left temporomandibular joint. IMPRESSION: 1. Age related volume loss with slight periventricular small vessel disease. No acute infarct. No mass or hemorrhage. 2.  Multiple foci of arterial vascular calcification. 3. Extensive multifocal paranasal sinus disease. Probable cerumen in the right external auditory canal. 4. 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 Electronically Signed   By: Lowella Grip III M.D.   On: 04/02/2018 20:04   Dg Chest Port 1 View  Result Date: 04/03/2018 CLINICAL DATA:  Check endotracheal tube placement EXAM: PORTABLE CHEST 1 VIEW COMPARISON:  04/02/2018 FINDINGS: Cardiac shadow is within normal limits. Lungs are well aerated bilaterally. New increased density in the right lung base is noted consistent with early infiltrate. Endotracheal tube and nasogastric catheter are again seen and stable. Aortic calcifications are noted. No bony abnormality is seen. IMPRESSION: Right basilar infiltrate new from the prior exam. Electronically Signed   By: Inez Catalina M.D.   On: 04/03/2018 08:33   Dg Chest Portable 1 View  Result Date:  04/02/2018 CLINICAL DATA:  Endotracheal tube adjustment EXAM: PORTABLE CHEST 1 VIEW COMPARISON:  Chest radiograph 04/02/2018 at 7:06 p.m. FINDINGS: Slight retraction of endotracheal tube, which is slightly above the carina. Retraction by 3 cm would  place the tip at the level of the clavicular heads. The enteric tube tip is below the field of view. Redemonstration of multiple rib fractures. No pneumothorax. Bibasilar atelectasis. Mild cardiomegaly. IMPRESSION: Endotracheal tube tip now above the carina. Retraction by another 3 cm would place the tip at the level of the clavicular heads. Electronically Signed   By: Ulyses Jarred M.D.   On: 04/02/2018 19:49   Dg Chest Port 1 View  Result Date: 04/02/2018 CLINICAL DATA:  Post cardiac arrest EXAM: PORTABLE CHEST 1 VIEW COMPARISON:  12/20/2017, 12/04/2015 FINDINGS: Postsurgical changes of the mediastinum. Tip of the endotracheal tube encroaches the right mainstem bronchus origin. No focal opacity or pleural effusion. Mild cardiomegaly with aortic atherosclerosis. No pneumothorax is seen. Acute left first second and third rib fractures. Acute appearing right first and second rib fractures. IMPRESSION: 1. Endotracheal tube tip overlies the origin of right mainstem bronchus. Study nodes indicate that the ETT will be adjusted and a repeat image obtained. 2. Mild cardiomegaly 3. Bilateral upper rib fractures.  No definitive pneumothorax. Electronically Signed   By: Donavan Foil M.D.   On: 04/02/2018 19:26     LOS: 11 days   Oren Binet, MD  [facility]  If 7PM-7AM, please contact night-coverage  Please page via [email or web address]-Password [email or web address]-click on MD name and type text message  04/13/2018, 1:17 PM

## 2018-04-13 NOTE — Progress Notes (Addendum)
[facility] Progress Note  Subjective: pt states "my heart stopped again last night", not sure what she is referring to though.  No new c/o.  No CP, SOB or N/V.  Very tired.    Physical Exam:  Vitals:   04/13/18 0900 04/13/18 1000 04/13/18 1100 04/13/18 1211  BP:    (!) 126/50  Pulse: 66 (!) 58 75 60  Resp: (!) 27 (!) 22 (!) 22 17  Temp:    98.2 F (36.8 C)  TempSrc:    Oral  SpO2: 98% 98% 98% 98%  Weight:      Height:        Inpatient medications: . aspirin  81 mg Oral Daily  . atorvastatin  40 mg Oral q1800  . B-complex with vitamin C  1 tablet Per Tube Daily  . brimonidine  1 drop Left Eye TID  . darbepoetin (ARANESP) injection - DIALYSIS  100 mcg Intravenous Q Thu-HD  . feeding supplement  1 Container Oral TID BM  . heparin  5,000 Units Subcutaneous Q8H  . hydrALAZINE  25 mg Oral Q8H  . insulin aspart  2-6 Units Subcutaneous TID WC  . insulin glargine  15 Units Subcutaneous Daily  . latanoprost  1 drop Both Eyes QHS  . mouth rinse  15 mL Mouth Rinse BID  . nebivolol  2.5 mg Oral Daily  . ticagrelor  90 mg Oral BID  . timolol  1 drop Left Eye Daily   . ferric gluconate (FERRLECIT/NULECIT) IV 125 mg (04/07/18 1945)   acetaminophen, alum & mag hydroxide-simeth, bisacodyl, calcium carbonate, docusate, Gerhardt's butt cream, guaiFENesin-dextromethorphan, ondansetron (ZOFRAN) IV Exam: General: Lying tired elderly debilitated WF obese Heart:RRR, s1s2 nl Lungs: Clear bilateral, good air entry, no wheezing Abdomen: Soft, nontender, nondistended Extremities: No edema Dialysis Access: Left upper extremity AV graft has good bruit.   Dialysis:[facility]  4h 76kg 3K/ 2Ca bath LUA AVG Hep none - mircera 100 q 2   Impression/ Plan: 1) VF arrest: Positive troponin echo with new wall motion abnormalities. LVEF 40-45%.  S/p cath on 9/25 with stent placement/ PCI of SVG to RCA. Now on aspirin and brilinta for at least 12 months. Seen by EP, stable.   2)  Vent tachycardia nonsustained, torsades: 9/26- 9/27, reperfusion event after PCI per EP/ cards. No indication for ICD / PPM. Resolved.  3) ESRD: TTS HD. Next dialysis on Tuesday.  Left upper extremity AV graft  4) Anemia: Hemoglobin around 9. Iron sat 15 %, ferritin 1432, on weekly nulecit. On mircera.  5) Secondary hyperparathyroidism: monitor ca, phos,   6) HTN/volume: BP acceptable.  On nevibolol and hydralazine.  Volume OK  7) Prognosis: recent start to hemodialysis x 3-4 weeks, new Vfib cardiac arrest and overall decline (per patient ) over last 1 year w/ ^'ing debility.  Will consult pall care team for GOC/ EOL discussions.  Have d/w husband and patient.    [name] MD [employer] pager [phone]   04/13/2018, 12:32 PM   Recent Labs  Lab 04/11/18 0844 04/12/18 0910  NA 135 135  K 4.0 3.6  CL 94* 96*  CO2 25 27  GLUCOSE 115* 115*  BUN 34* 22  CREATININE 4.69* 3.37*  CALCIUM 8.9 8.9  PHOS 6.7*  --   ALBUMIN 2.2*  --    No results for input(s): AST, ALT, ALKPHOS, BILITOT, PROT in the last 168 hours. Recent Labs  Lab 04/11/18 0844 04/12/18 0910  WBC 9.6 11.3*  HGB  9.1* 9.4*  HCT 29.1* 30.3*  MCV 106.6* 107.1*  PLT 188 186

## 2018-04-13 NOTE — Progress Notes (Addendum)
Progress Note  Patient Name: [name] Date of Encounter: 04/13/2018  Primary Cardiologist: Larae Grooms, MD   Subjective   No chest pain or complaints this am Hard of hearing   Inpatient Medications    Scheduled Meds: . aspirin  81 mg Oral Daily  . atorvastatin  40 mg Oral q1800  . B-complex with vitamin C  1 tablet Per Tube Daily  . brimonidine  1 drop Left Eye TID  . darbepoetin (ARANESP) injection - DIALYSIS  100 mcg Intravenous Q Thu-HD  . feeding supplement  1 Container Oral TID BM  . heparin  5,000 Units Subcutaneous Q8H  . hydrALAZINE  25 mg Oral Q8H  . insulin aspart  2-6 Units Subcutaneous TID WC  . insulin glargine  15 Units Subcutaneous Daily  . latanoprost  1 drop Both Eyes QHS  . mouth rinse  15 mL Mouth Rinse BID  . [START ON 04/14/2018] metoprolol tartrate  50 mg Oral BID  . ticagrelor  90 mg Oral BID  . timolol  1 drop Left Eye Daily  . vancomycin  125 mg Oral QID   Continuous Infusions: . ferric gluconate (FERRLECIT/NULECIT) IV 125 mg (04/07/18 1945)   PRN Meds: acetaminophen, alum & mag hydroxide-simeth, bisacodyl, calcium carbonate, docusate, Gerhardt's butt cream, guaiFENesin-dextromethorphan, ondansetron (ZOFRAN) IV   Vital Signs    Vitals:   04/13/18 0009 04/13/18 0500 04/13/18 0530 04/13/18 0748  BP: (!) 133/39  (!) 151/60 (!) 161/52  Pulse:    74  Resp: 17  17 16   Temp: 97.6 F (36.4 C)  97.9 F (36.6 C) 97.8 F (36.6 C)  TempSrc: Oral  Oral Oral  SpO2: 97%  97% 98%  Weight:  73.5 kg    Height:       No intake or output data in the 24 hours ending 04/13/18 0805 Filed Weights   04/11/18 1229 04/12/18 0500 04/13/18 0500  Weight: 75.2 kg 73.6 kg 73.5 kg    Telemetry    Nonsustained ventricular tachycardia, torsades noted yesterday 17: 20, 4 episodes.  Bradycardic episode noted earlier this morning during cough, vagal down to 20 bpm- Personally Reviewed  ECG    Prior EKG from 04/09/2018 shows right bundle branch block,  sinus rhythm with QTC of 495- Personally Reviewed  Physical Exam  Mild confusion  GEN: No acute distress.  Appears tired but smiling Neck: No JVD Cardiac: RRR SEM AV sclerosis  Respiratory: Clear to auscultation bilaterally. GI: Soft, nontender, non-distended  MS: No edema; No deformity. Neuro:  Nonfocal  Psych: Normal affect, mild confusion Fistula LUE with thrill   Labs    Chemistry Recent Labs  Lab 04/10/18 0324 04/11/18 0844 04/12/18 0910  NA 138 135 135  K 3.9 4.0 3.6  CL 98 94* 96*  CO2 29 25 27   GLUCOSE 124* 115* 115*  BUN 19 34* 22  CREATININE 3.13* 4.69* 3.37*  CALCIUM 8.7* 8.9 8.9  ALBUMIN  --  2.2*  --   GFRNONAA 13* 8* 12*  GFRAA 15* 9* 14*  ANIONGAP 11 16* 12     Hematology Recent Labs  Lab 04/10/18 0324 04/11/18 0844 04/12/18 0910  WBC 9.6 9.6 11.3*  RBC 2.86* 2.73* 2.83*  HGB 9.7* 9.1* 9.4*  HCT 30.7* 29.1* 30.3*  MCV 107.3* 106.6* 107.1*  MCH 33.9 33.3 33.2  MCHC 31.6 31.3 31.0  RDW 14.5 14.5 14.4  PLT 171 188 186    Cardiac EnzymesNo results for input(s): TROPONINI in the last 168  hours. No results for input(s): TROPIPOC in the last 168 hours.   BNPNo results for input(s): BNP, PROBNP in the last 168 hours.   DDimer No results for input(s): DDIMER in the last 168 hours.   Radiology    No results found.  Cardiac Studies  Cardiac catheterization 2018/05/04:  Ost 1st Mrg lesion is 75% stenosed.  Prox Cx lesion is 100% stenosed. Left to left collaterals. Small AV groove circumflex.  Ost 1st Diag lesion is 100% stenosed. SVG to diag is patent.  Prox LAD lesion is 95% stenosed. LIMA to LAD is patent.  Prox RCA lesion is 95% stenosed. Mid Graft lesion is 95% stenosed in SVG to PLA.  A drug-eluting stent was successfully placed using a STENT RESOLUTE ONYX 2.0X15.  Post intervention, there is a 0% residual stenosis.  LV end diastolic pressure is normal.  There is no aortic valve stenosis.  Calcified femoral  artery.  Echocardiogram 03/2018: Study Conclusions  - Left ventricle: The cavity size was normal. Wall thickness was increased in a pattern of mild LVH. Systolic function was mildly reduced. The estimated ejection fraction was in the range of 45% to 50%. There is akinesis of the basal-midinferolateral and inferior myocardium. Doppler parameters are consistent with abnormal left ventricular relaxation (grade 1 diastolic dysfunction). - Mitral valve: Calcified annulus. There was mild regurgitation.  Impressions:  - Akinesis of the basal/mid inferior and inferolateral walls with overall mildly reduced LV systolic function; mild diastolic dysfunction; mild LVH; mild MR.  Patient Profile     77 y.o. female post cardiac VF arrest, revascularization of SVG to RCA graft on May 04, 2018 with improving encephalopathy, end-stage renal disease on hemodialysis, aspiration pneumonia, diabetes type 2 Telemetry  With some ploymorphic VT during first 48 hours tends toward bradycardia especially at night and with coughing   Assessment & Plan    Ventricular tachycardia nonsustained, torsades - within first 48 hours of MI and revascularization seen by EP and no indication for AICD/PPM -Off of Celexa since hospitalization.  Avoiding QT prolonging agents. -Magnesium and K ok   Bradycardic episode 04/10/2018 morning - In the setting of cough, likely vagal. -previously on lopressor 50 bid will change to bystolic 5 mg only   VF arrest - Encephalopathy improving day by day. -Revascularization SVG to RCA -Awaiting rehab placement.  End-stage renal disease on hemodialysis - Per nephrology team.       For questions or updates, please contact [place] Please consult [email or web address] for contact info under        Signed, Jenkins Rouge, MD  04/13/2018, 8:05 AM

## 2018-04-13 NOTE — Progress Notes (Signed)
Physical Therapy Treatment Patient Details Name: [name] MRN: [id number] DOB: 1940/11/26 Today's Date: 04/13/2018    History of Present Illness Pt adm with vfib arrest with out of hospital CPR x 12 minutes. Intubated 9/19-9/21. Pt also +Cdiff. PMH - ESRD with recent initiation of HD, cabg, ckd, dm, mi, lt knee surgery, neck surgery, back surgery, afib, CEA, CVA, multiple falls.     PT Comments    Pt pleasant on arrival, visiting with granddaughter. Pt reports feeling increased fatigue today, with bottom pain and leaking rectal pouch being a limiting factor during the session. Pt strength is increasing with transfers being min guard to min assist +1 throughout. Pt will benefit from a chair follow in order to progress ambulation. Pt is motivated to continue therapy and progress independence.    Follow Up Recommendations  CIR;Supervision/Assistance - 24 hour     Equipment Recommendations  None recommended by PT    Recommendations for Other Services       Precautions / Restrictions Precautions Precautions: Fall Precaution Comments: rectal tube Restrictions Weight Bearing Restrictions: No    Mobility  Bed Mobility Overal bed mobility: Needs Assistance Bed Mobility: Rolling;Sidelying to Sit Rolling: Modified independent (Device/Increase time) Sidelying to sit: Min assist       General bed mobility comments: assist to scoot hips to edge of bed with use of pad. 1 hha to rise for side to sit.   Transfers Overall transfer level: Needs assistance Equipment used: Rolling walker (2 wheeled) Transfers: Sit to/from Stand Sit to Stand: Min assist Stand pivot transfers: Min guard       General transfer comment: cues for hand placement on bed and to reach back for chair. three standing trials, guard to rise, min assist while standing for quick fatigue, prolonged standing to get cleaned up. min guard for pivot to chair   Ambulation/Gait                 Stairs             Wheelchair Mobility    Modified Rankin (Stroke Patients Only)       Balance Overall balance assessment: Needs assistance Sitting-balance support: Feet supported Sitting balance-Leahy Scale: Fair Sitting balance - Comments: UE support   Standing balance support: Bilateral upper extremity supported Standing balance-Leahy Scale: Poor Standing balance comment: bilateral UE support for standing                            Cognition Arousal/Alertness: Awake/alert Behavior During Therapy: WFL for tasks assessed/performed Overall Cognitive Status: Within Functional Limits for tasks assessed                                 General Comments: pt requires increased time for movement. self-limiting by pain.       Exercises      General Comments General comments (skin integrity, edema, etc.): granddaughter in room.       Pertinent Vitals/Pain Pain Assessment: Faces Faces Pain Scale: Hurts whole lot Pain Location: rectum sitting, chest with sitting up  Pain Intervention(s): Limited activity within patient's tolerance;Repositioned;Other (comment)(pt cleaned and cream applied. )    Home Living                      Prior Function            PT Goals (current goals  can now be found in the care plan section) Progress towards PT goals: Progressing toward goals    Frequency    Min 3X/week      PT Plan Current plan remains appropriate    Co-evaluation              AM-PAC PT "6 Clicks" Daily Activity  Outcome Measure  Difficulty turning over in bed (including adjusting bedclothes, sheets and blankets)?: Unable Difficulty moving from lying on back to sitting on the side of the bed? : Unable Difficulty sitting down on and standing up from a chair with arms (e.g., wheelchair, bedside commode, etc,.)?: Unable Help needed moving to and from a bed to chair (including a wheelchair)?: A Little Help needed walking in hospital  room?: A Little Help needed climbing 3-5 steps with a railing? : A Lot 6 Click Score: 11    End of Session Equipment Utilized During Treatment: Gait belt Activity Tolerance: Patient limited by fatigue;Patient limited by pain Patient left: in chair;with call bell/phone within reach;with family/visitor present Nurse Communication: Mobility status;Other (comment)(rectal pouch not sealed. ) PT Visit Diagnosis: Unsteadiness on feet (R26.81);Other abnormalities of gait and mobility (R26.89);Repeated falls (R29.6);Muscle weakness (generalized) (M62.81)     Time: 9311-2162 PT Time Calculation (min) (ACUTE ONLY): 37 min  Charges:  $Therapeutic Activity: 23-37 mins                     Samuella Bruin, [place]  Acute Rehab [phone]    Samuella Bruin 04/13/2018, 12:30 PM

## 2018-04-14 DIAGNOSIS — N186 End stage renal disease: Secondary | ICD-10-CM | POA: Diagnosis not present

## 2018-04-14 DIAGNOSIS — E1129 Type 2 diabetes mellitus with other diabetic kidney complication: Secondary | ICD-10-CM | POA: Diagnosis not present

## 2018-04-14 DIAGNOSIS — Z992 Dependence on renal dialysis: Secondary | ICD-10-CM | POA: Diagnosis not present

## 2018-04-14 LAB — CBC
HCT: 32.1 % — ABNORMAL LOW (ref 36.0–46.0)
Hemoglobin: 10.3 g/dL — ABNORMAL LOW (ref 12.0–15.0)
MCH: 33.6 pg (ref 26.0–34.0)
MCHC: 32.1 g/dL (ref 30.0–36.0)
MCV: 104.6 fL — AB (ref 78.0–100.0)
PLATELETS: 230 10*3/uL (ref 150–400)
RBC: 3.07 MIL/uL — ABNORMAL LOW (ref 3.87–5.11)
RDW: 14.3 % (ref 11.5–15.5)
WBC: 13.1 10*3/uL — AB (ref 4.0–10.5)

## 2018-04-14 LAB — RENAL FUNCTION PANEL
Albumin: 2.5 g/dL — ABNORMAL LOW (ref 3.5–5.0)
Anion gap: 21 — ABNORMAL HIGH (ref 5–15)
BUN: 47 mg/dL — AB (ref 8–23)
CHLORIDE: 94 mmol/L — AB (ref 98–111)
CO2: 19 mmol/L — AB (ref 22–32)
CREATININE: 5.88 mg/dL — AB (ref 0.44–1.00)
Calcium: 9 mg/dL (ref 8.9–10.3)
GFR calc Af Amer: 7 mL/min — ABNORMAL LOW (ref >60)
GFR, EST NON AFRICAN AMERICAN: 6 mL/min — AB (ref >60)
GLUCOSE: 138 mg/dL — AB (ref 70–99)
POTASSIUM: 4.1 mmol/L (ref 3.5–5.1)
Phosphorus: 7.2 mg/dL — ABNORMAL HIGH (ref 2.5–4.6)
Sodium: 134 mmol/L — ABNORMAL LOW (ref 135–145)

## 2018-04-14 LAB — GLUCOSE, CAPILLARY
Glucose-Capillary: 112 mg/dL — ABNORMAL HIGH (ref 70–99)
Glucose-Capillary: 145 mg/dL — ABNORMAL HIGH (ref 70–99)
Glucose-Capillary: 106 mg/dL — ABNORMAL HIGH (ref 70–99)

## 2018-04-14 LAB — MAGNESIUM: Magnesium: 2 mg/dL (ref 1.7–2.4)

## 2018-04-14 MED ORDER — NEPRO/CARBSTEADY PO LIQD
237.0000 mL | Freq: Two times a day (BID) | ORAL | Status: DC
  Filled 2018-04-14 (×3): qty 237

## 2018-04-14 NOTE — Progress Notes (Signed)
OT Cancellation Note  Patient Details Name: [name] MRN: [id number] DOB: 18-Aug-1940   Cancelled Treatment:    Reason Eval/Treat Not Completed: Patient at procedure or test/ unavailable. Pt in hemodialysis.   [place], OTR/L Acute Rehab Services Pager [phone] Office [phone]    Almon Register 04/14/2018, 9:32 AM

## 2018-04-14 NOTE — Progress Notes (Signed)
Progress Note  Patient Name: [name] Date of Encounter: 04/14/2018  Primary Cardiologist: Larae Grooms, MD   Subjective   No chest pain for dialysis   Inpatient Medications    Scheduled Meds: . aspirin  81 mg Oral Daily  . atorvastatin  40 mg Oral q1800  . B-complex with vitamin C  1 tablet Per Tube Daily  . brimonidine  1 drop Left Eye TID  . Chlorhexidine Gluconate Cloth  6 each Topical Q0600  . darbepoetin (ARANESP) injection - DIALYSIS  100 mcg Intravenous Q Thu-HD  . feeding supplement  1 Container Oral TID BM  . heparin  5,000 Units Subcutaneous Q8H  . hydrALAZINE  25 mg Oral Q8H  . insulin aspart  2-6 Units Subcutaneous TID WC  . insulin glargine  15 Units Subcutaneous Daily  . latanoprost  1 drop Both Eyes QHS  . mouth rinse  15 mL Mouth Rinse BID  . nebivolol  2.5 mg Oral Daily  . ticagrelor  90 mg Oral BID  . timolol  1 drop Left Eye Daily   Continuous Infusions: . ferric gluconate (FERRLECIT/NULECIT) IV 125 mg (04/07/18 1945)   PRN Meds: acetaminophen, alum & mag hydroxide-simeth, bisacodyl, calcium carbonate, docusate, Gerhardt's butt cream, guaiFENesin-dextromethorphan, ondansetron (ZOFRAN) IV   Vital Signs    Vitals:   04/13/18 1952 04/13/18 2349 04/14/18 0340 04/14/18 0346  BP: (!) 121/96 (!) 129/37  110/65  Pulse: 65 (!) 54 75 76  Resp: 17 14 (!) 23 (!) 21  Temp: 97.9 F (36.6 C) 97.8 F (36.6 C)  97.8 F (36.6 C)  TempSrc: Oral Oral  Oral  SpO2: 96% 99% 99% 98%  Weight:    73 kg  Height:        Intake/Output Summary (Last 24 hours) at 04/14/2018 0755 Last data filed at 04/13/2018 2135 Gross per 24 hour  Intake 240 ml  Output 250 ml  Net -10 ml   Filed Weights   04/12/18 0500 04/13/18 0500 04/14/18 0346  Weight: 73.6 kg 73.5 kg 73 kg    Telemetry    Nonsustained ventricular tachycardia, torsades noted yesterday 17: 20, 4 episodes.  Bradycardic episode noted earlier this morning during cough, vagal down to 20 bpm-  Personally Reviewed  ECG    Prior EKG from 04/09/2018 shows right bundle branch block, sinus rhythm with QTC of 495- Personally Reviewed  Physical Exam  Mild confusion  GEN: No acute distress.  Appears tired but smiling Neck: No JVD Cardiac: RRR SEM AV sclerosis  Respiratory: Clear to auscultation bilaterally. GI: Soft, nontender, non-distended  MS: No edema; No deformity. Neuro:  Nonfocal  Psych: Normal affect, mild confusion Fistula LUE with thrill   Labs    Chemistry Recent Labs  Lab 04/11/18 0844 04/12/18 0910 04/14/18 0221  NA 135 135 134*  K 4.0 3.6 4.1  CL 94* 96* 94*  CO2 25 27 19*  GLUCOSE 115* 115* 138*  BUN 34* 22 47*  CREATININE 4.69* 3.37* 5.88*  CALCIUM 8.9 8.9 9.0  ALBUMIN 2.2*  --  2.5*  GFRNONAA 8* 12* 6*  GFRAA 9* 14* 7*  ANIONGAP 16* 12 21*     Hematology Recent Labs  Lab 04/11/18 0844 04/12/18 0910 04/14/18 0221  WBC 9.6 11.3* 13.1*  RBC 2.73* 2.83* 3.07*  HGB 9.1* 9.4* 10.3*  HCT 29.1* 30.3* 32.1*  MCV 106.6* 107.1* 104.6*  MCH 33.3 33.2 33.6  MCHC 31.3 31.0 32.1  RDW 14.5 14.4 14.3  PLT 188 186  230    Cardiac EnzymesNo results for input(s): TROPONINI in the last 168 hours. No results for input(s): TROPIPOC in the last 168 hours.   BNPNo results for input(s): BNP, PROBNP in the last 168 hours.   DDimer No results for input(s): DDIMER in the last 168 hours.   Radiology    No results found.  Cardiac Studies  Cardiac catheterization 04-09-18:  Ost 1st Mrg lesion is 75% stenosed.  Prox Cx lesion is 100% stenosed. Left to left collaterals. Small AV groove circumflex.  Ost 1st Diag lesion is 100% stenosed. SVG to diag is patent.  Prox LAD lesion is 95% stenosed. LIMA to LAD is patent.  Prox RCA lesion is 95% stenosed. Mid Graft lesion is 95% stenosed in SVG to PLA.  A drug-eluting stent was successfully placed using a STENT RESOLUTE ONYX 2.0X15.  Post intervention, there is a 0% residual stenosis.  LV end diastolic  pressure is normal.  There is no aortic valve stenosis.  Calcified femoral artery.  Echocardiogram 03/2018: Study Conclusions  - Left ventricle: The cavity size was normal. Wall thickness was increased in a pattern of mild LVH. Systolic function was mildly reduced. The estimated ejection fraction was in the range of 45% to 50%. There is akinesis of the basal-midinferolateral and inferior myocardium. Doppler parameters are consistent with abnormal left ventricular relaxation (grade 1 diastolic dysfunction). - Mitral valve: Calcified annulus. There was mild regurgitation.  Impressions:  - Akinesis of the basal/mid inferior and inferolateral walls with overall mildly reduced LV systolic function; mild diastolic dysfunction; mild LVH; mild MR.  Patient Profile     77 y.o. female post cardiac VF arrest, revascularization of SVG to RCA graft on 09-Apr-2018 with improving encephalopathy, end-stage renal disease on hemodialysis, aspiration pneumonia, diabetes type 2 Telemetry  With some ploymorphic VT during first 48 hours tends toward bradycardia especially at night and with coughing   Assessment & Plan    Ventricular tachycardia nonsustained, torsades - within first 48 hours of MI and revascularization seen by EP and no indication for AICD/PPM -Off of Celexa since hospitalization.  Avoiding QT prolonging agents. -Magnesium and K ok  - Patient is DNR   Bradycardic episode 04/10/2018 morning - In the setting of cough, likely vagal. -previously on lopressor 50 bid No long pauses with bystolic so far   VF arrest - Encephalopathy improving day by day. -Revascularization SVG to RCA -Awaiting rehab placement.  End-stage renal disease on hemodialysis - Per nephrology team. Dialysis today        For questions or updates, please contact [place] Please consult [email or web address] for contact info under        Signed, Jenkins Rouge, MD  04/14/2018, 7:55 AM

## 2018-04-14 NOTE — Progress Notes (Signed)
OT Cancellation Note  Patient Details Name: NIAH HEINLE MRN: [id number] DOB: May 09, 1941   Cancelled Treatment:    Reason Eval/Treat Not Completed: Patient at procedure or test/ unavailable.  Pt in HD.  Will reattempt.  Lucille Passy, OTR/L Acute Rehabilitation Services Pager [phone] Office [phone]   Lucille Passy M 04/14/2018, 1:20 PM

## 2018-04-14 NOTE — Progress Notes (Addendum)
Nutrition Follow-up  DOCUMENTATION CODES:   Not applicable  INTERVENTION:   -D/c Boost Breeze po TID, each supplement provides 250 kcal and 9 grams of protein, due to poor acceptance -Continue B-complex with vitamin C MVI daily -Nepro Shake po BID, each supplement provides 425 kcal and 19 grams protein  NUTRITION DIAGNOSIS:   Increased nutrient needs related to acute illness as evidenced by estimated needs.  Ongoing  GOAL:   Patient will meet greater than or equal to 90% of their needs  Progressing  MONITOR:   PO intake, Supplement acceptance, Labs, Skin, Weight trends, I & O's  REASON FOR ASSESSMENT:   Consult, Ventilator Enteral/tube feeding initiation and management  ASSESSMENT:    77 yo female admitted post V.fib arrest with respiratory insufficiency requiring intubation, pneumonia post aspiration event, diarrhea with C.diff antigen +, toxin negative. PMH includes ESRD on HD, DM, HTN, HLD, CAD, PAF, MI  9/25- extubated, TF d/c, s/p cardiac cath 9/27- transferred from ICU to PCU 9/30- rectal tube d/c  Reviewed I/O's: -10 ml x 24 hours and -3.5 L since admission  Pt out of room for HD at time of visit. No family at bedside to provide additional history.   Pt with poor appetite; noted meal completion 10-60%. Breakfast tray was unattempted this morning. Pt also had a bag of food from [place] in the room. She consumed about 25% of one Boost Breeze supplement, however, another supplement was in room unopened.   Noted wt gain since last wee, which may be related to fluid overload.   Per CIR, may re-evaluate readiness for CIR admission once rectal tube is d/c. Pt declining hospice/palliative care involvement.   Labs reviewed: Na: 134, Phos: 7.2, Mg and K WDL, CBGS: 138-202 (inpatient orders for glycemic control are 2-6 units insulin aspart TID with meals and 15 units insulin glargine daily).   Diet Order:   Diet Order            Diet Carb Modified Fluid  consistency: Thin; Room service appropriate? Yes  Diet effective now              EDUCATION NEEDS:   Not appropriate for education at this time  Skin:  Skin Assessment: Skin Integrity Issues: Skin Integrity Issues:: Other (Comment) Other: MASD to buttocks  Last BM:  04/13/18 (250 ml via rectal tube)  Height:   Ht Readings from Last 1 Encounters:  04/02/18 5\' 4"  (1.626 m)    Weight:   Wt Readings from Last 1 Encounters:  04/14/18 73 kg    Ideal Body Weight:  54.5 kg  BMI:  Body mass index is 27.62 kg/m.  Estimated Nutritional Needs:   Kcal:  1600-1800  Protein:  80-95 gm  Fluid:  1,000 ml + UOP    Klaryssa Fauth A. Jimmye Norman, RD, LDN, CDE Pager: [phone] After hours Pager: [phone]

## 2018-04-14 NOTE — Progress Notes (Addendum)
PROGRESS NOTE        PATIENT DETAILS Name: [name] Age: 77 y.o. Sex: female Date of Birth: [birth year] Admit Date: 04/02/2018 Admitting Physician Reyne Dumas, MD EKC:[id number], Herbie [place], MD  Brief Narrative:  Patient is a 77 years old female with history of diabetes mellitus, hypertension, end-stage renal disease on hemodialysis presented to the hospital with ventricular fibrillation arrest.  Patient was evaluated by cardiology. 2 D echo showed reduced ejection fraction with regional wall motion abnormality.  Cardiac catheterization was performed on 04/09/2018 with placement of drug-eluting stent to the SVG to PLA.  Hospital course was complicated by aspiration pneumonia, C. difficile colitis, nonsustained V. tach, bradycardia and sinus pauses.  See below for details.  Subjective:  Patient complaint of mild chest discomfort.  Has dyspnea and dizziness nausea vomiting.  She still has some diarrhea from recent C. difficile but is starting to form  Assessment/Plan:  Ventricular fibrillation arrest at outside hospital.  ROSC by the time of arrival to the ER.  Patient did not undergo hypothermia protocol.  2D echocardiogram on 04/03/2018 showed reduced ejection fraction of 45%.  Patient underwent cardiac catheterization and PCI on 04/09/2018.  Patient is currently on aspirin, Brilinta and statin.  We will continue  Nonsustained ventricular tachycardia status post PCI.  Patient has been seen by EP cardiology.  There is no indication for AICD/permanent pacemaker.  Off Celexa.  Avoid QT prolonging medication and continue to monitor magnesium and potassium levels.  Patient is DNR.    Bradycardia with approximate 6-second pause on 04/10/2018.  Patient has been changed to Bystolic from metoprolol per cardiology recommendation..  No further pauses.  No intervention planned  End-stage renal disease on hemodialysis.  Tuesday, Thursday Saturday.  Nephrology on board for  hemodialysis.  Seen after hemodialysis today.  Recent C. difficile colitis: Completed course of vancomycin on 04/13/2018.  Stools are more formed.  Aspiration pneumonia: He has completed 5-day course of Zosyn.  Currently patient is afebrile.  Prolonged QTc: Avoid QTC prolonging medication including SSRI.  Anemia of chronic kidney disease.  Nephrology on board.    Diabetes mellitus type II.  Continue to monitor blood glucose levels.  On Lantus and sliding scale insulin.    DVT Prophylaxis: Heparin  Code Status: DO NOT RESUSCITATE  Family Communication: Spoke with multiple family members at bedside and updated about the clinical condition of the patient  Disposition Plan: Awaiting for inpatient rehabilitation as per physical therapy recommendation.  Antimicrobial agents: Anti-infectives (From admission, onward)   Start     Dose/Rate Route Frequency Ordered Stop   04/07/18 1000  vancomycin (VANCOCIN) 50 mg/mL oral solution 125 mg     125 mg Oral 4 times daily 04/07/18 0844 04/13/18 0959   04/04/18 1200  vancomycin (VANCOCIN) IVPB 1000 mg/200 mL premix  Status:  Discontinued     1,000 mg 200 mL/hr over 60 Minutes Intravenous Every T-Th-Sa (Hemodialysis) 04/02/18 2145 04/05/18 1144   04/04/18 1200  piperacillin-tazobactam (ZOSYN) IVPB 3.375 g     3.375 g 12.5 mL/hr over 240 Minutes Intravenous Every 12 hours 04/04/18 0922 04/08/18 0631   04/03/18 1200  vancomycin (VANCOCIN) 50 mg/mL oral solution 500 mg  Status:  Discontinued     500 mg Oral Every 6 hours 04/03/18 1017 04/07/18 0844   04/03/18 1000  vancomycin (VANCOCIN) 50 mg/mL oral solution 125 mg  Status:  Discontinued     125 mg Oral 4 times daily 04/03/18 0954 04/03/18 1017   04/03/18 0800  piperacillin-tazobactam (ZOSYN) IVPB 3.375 g  Status:  Discontinued     3.375 g 12.5 mL/hr over 240 Minutes Intravenous Every 12 hours 04/02/18 2145 04/04/18 0922   04/02/18 2030  piperacillin-tazobactam (ZOSYN) IVPB 3.375 g     3.375  g 100 mL/hr over 30 Minutes Intravenous  Once 04/02/18 2018 04/02/18 2200   04/02/18 2030  vancomycin (VANCOCIN) 1,500 mg in sodium chloride 0.9 % 500 mL IVPB     1,500 mg 250 mL/hr over 120 Minutes Intravenous  Once 04/02/18 2019 04/03/18 0305      Procedures: 9/25>> LHC:  Ost 1st Mrg lesion is 75% stenosed.  Prox Cx lesion is 100% stenosed. Left to left collaterals. Small AV groove circumflex.  Ost 1st Diag lesion is 100% stenosed. SVG to diag is patent.  Prox LAD lesion is 95% stenosed. LIMA to LAD is patent.  Prox RCA lesion is 95% stenosed. Mid Graft lesion is 95% stenosed in SVG to PLA.  A drug-eluting stent was successfully placed using a STENT RESOLUTE ONYX 2.0X15.  Post intervention, there is a 0% residual stenosis.  LV end diastolic pressure is normal.  There is no aortic valve stenosis.  Calcified femoral artery.  9/20>> echocardiogram:   Akinesis of the basal/mid inferior and inferolateral walls with overall mildly reduced LV systolic function; mild diastolic dysfunction; mild LVH; mild MR.  CONSULTS:  cardiology  CIR.  Time spent: 62- minutes-Greater than 50% of this time was spent in counseling, explanation of diagnosis, planning of further management, and coordination of care.  MEDICATIONS: Scheduled Meds: . aspirin  81 mg Oral Daily  . atorvastatin  40 mg Oral q1800  . B-complex with vitamin C  1 tablet Per Tube Daily  . brimonidine  1 drop Left Eye TID  . Chlorhexidine Gluconate Cloth  6 each Topical Q0600  . darbepoetin (ARANESP) injection - DIALYSIS  100 mcg Intravenous Q Thu-HD  . feeding supplement  1 Container Oral TID BM  . heparin  5,000 Units Subcutaneous Q8H  . hydrALAZINE  25 mg Oral Q8H  . insulin aspart  2-6 Units Subcutaneous TID WC  . insulin glargine  15 Units Subcutaneous Daily  . latanoprost  1 drop Both Eyes QHS  . mouth rinse  15 mL Mouth Rinse BID  . nebivolol  2.5 mg Oral Daily  . ticagrelor  90 mg Oral BID  .  timolol  1 drop Left Eye Daily   Continuous Infusions: . ferric gluconate (FERRLECIT/NULECIT) IV 125 mg (04/07/18 1945)   PRN Meds:.acetaminophen, alum & mag hydroxide-simeth, bisacodyl, calcium carbonate, docusate, Gerhardt's butt cream, guaiFENesin-dextromethorphan, ondansetron (ZOFRAN) IV   PHYSICAL EXAM: Vital signs: Vitals:   04/14/18 0752 04/14/18 0805 04/14/18 0810 04/14/18 0830  BP: (!) 171/48 (!) 157/57 98/65 (!) 85/29  Pulse: 66 (!) 58 (!) 59 61  Resp: 20     Temp: 97.7 F (36.5 C)     TempSrc: Oral     SpO2: 98%     Weight:      Height:       Filed Weights   04/12/18 0500 04/13/18 0500 04/14/18 0346  Weight: 73.6 kg 73.5 kg 73 kg   Body mass index is 27.62 kg/m.   Examination: General exam: Appears calm and comfortable ,Not in distress HEENT:PERRL,Oral mucosa moist, Ear/Nose normal on gross exam Respiratory system: Bilateral equal air entry, normal vesicular breath  sounds, no wheezes or crackles  Cardiovascular system: S1 & S2 heard, RRR.  Chest wall tenderness on palpation no JVD, murmurs, rubs, gallops or clicks. No pedal edema. Gastrointestinal system: Abdomen is nondistended, soft and nontender. No organomegaly or masses felt. Normal bowel sounds heard. Central nervous system: Alert and oriented. No focal neurological deficits. Extremities: No edema, no clubbing ,no cyanosis, distal peripheral pulses palpable. Skin: No rashes, lesions or ulcers,no icterus ,no pallor MSK: Normal muscle bulk,tone ,power  I have personally reviewed following labs and imaging studies  LABORATORY DATA: CBC: Recent Labs  Lab 04/09/18 0319 04/10/18 0324 04/11/18 0844 04/12/18 0910 04/14/18 0221  WBC 10.1 9.6 9.6 11.3* 13.1*  HGB 9.4* 9.7* 9.1* 9.4* 10.3*  HCT 30.1* 30.7* 29.1* 30.3* 32.1*  MCV 106.4* 107.3* 106.6* 107.1* 104.6*  PLT 174 171 188 186 301    Basic Metabolic Panel: Recent Labs  Lab 04/09/18 1922 04/10/18 0324 04/10/18 1146 04/11/18 0844  04/12/18 0910 04/14/18 0221  NA 138 138  --  135 135 134*  K 3.9 3.9  --  4.0 3.6 4.1  CL 96* 98  --  94* 96* 94*  CO2 28 29  --  25 27 19*  GLUCOSE 119* 124*  --  115* 115* 138*  BUN 15 19  --  34* 22 47*  CREATININE 2.49* 3.13*  --  4.69* 3.37* 5.88*  CALCIUM 8.7* 8.7*  --  8.9 8.9 9.0  MG 2.0  --  2.0  --   --  2.0  PHOS  --   --   --  6.7*  --  7.2*    GFR: Estimated Creatinine Clearance: 7.7 mL/min (A) (by C-G formula based on SCr of 5.88 mg/dL (H)).  Liver Function Tests: Recent Labs  Lab 04/11/18 0844 04/14/18 0221  ALBUMIN 2.2* 2.5*   No results for input(s): LIPASE, AMYLASE in the last 168 hours. No results for input(s): AMMONIA in the last 168 hours.  Coagulation Profile: No results for input(s): INR, PROTIME in the last 168 hours.  Cardiac Enzymes: No results for input(s): CKTOTAL, CKMB, CKMBINDEX, TROPONINI in the last 168 hours.  BNP (last 3 results) No results for input(s): PROBNP in the last 8760 hours.  HbA1C: No results for input(s): HGBA1C in the last 72 hours.  CBG: Recent Labs  Lab 04/12/18 2155 04/13/18 0751 04/13/18 1210 04/13/18 1624 04/13/18 2125  GLUCAP 72 104* 170* 202* 138*    Lipid Profile: No results for input(s): CHOL, HDL, LDLCALC, TRIG, CHOLHDL, LDLDIRECT in the last 72 hours.  Thyroid Function Tests: No results for input(s): TSH, T4TOTAL, FREET4, T3FREE, THYROIDAB in the last 72 hours.  Anemia Panel: No results for input(s): VITAMINB12, FOLATE, FERRITIN, TIBC, IRON, RETICCTPCT in the last 72 hours.  Urine analysis:    Component Value Date/Time   COLORURINE YELLOW 12/21/2015 2222   APPEARANCEUR CLEAR 12/21/2015 2222   LABSPEC 1.013 12/21/2015 2222   PHURINE 6.5 12/21/2015 2222   GLUCOSEU NEGATIVE 12/21/2015 2222   HGBUR MODERATE (A) 12/21/2015 2222   BILIRUBINUR NEGATIVE 12/21/2015 2222   KETONESUR NEGATIVE 12/21/2015 2222   PROTEINUR >300 (A) 12/21/2015 2222   UROBILINOGEN 0.2 04/19/2015 1248   NITRITE POSITIVE  (A) 12/21/2015 2222   LEUKOCYTESUR TRACE (A) 12/21/2015 2222    Sepsis Labs: Lactic Acid, Venous    Component Value Date/Time   LATICACIDVEN 2.6 (HH) 04/03/2018 0054    MICROBIOLOGY: No results found for this or any previous visit (from the past 240 hour(s)).  RADIOLOGY STUDIES/RESULTS:  Dg Chest 2 View  Result Date: 04/10/2018 CLINICAL DATA:  ESRD on HD TThS, coronary disease, HTN, and IDDM who presented with VF arrest. Not cooled because was able to interact immediately after resuscitation. Also diarrhea on admission, Cdiff antigen positive and suspected aspiration pneumonia. EXAM: CHEST - 2 VIEW COMPARISON:  04/03/2018 FINDINGS: Status post median sternotomy and CABG. The heart is mildly enlarged. No pulmonary edema. There is focal opacity in the LEFT lung base, associated with small pleural effusion, consistent with early infiltrate or atelectasis. IMPRESSION: 1. LEFT LOWER lobe infiltrate/atelectasis and small effusion. 2. Cardiomegaly.  No edema. Electronically Signed   By: Nolon Nations M.D.   On: 04/10/2018 13:44   Dg Abd 1 View  Result Date: 04/03/2018 CLINICAL DATA:  OG tube placement EXAM: ABDOMEN - 1 VIEW COMPARISON:  12/04/2015 FINDINGS: OG tube tip is in the distal stomach or proximal duodenum. Nonobstructive bowel gas pattern noted in the upper abdomen. IMPRESSION: OG tube tip in the distal stomach or proximal duodenum. Electronically Signed   By: Rolm Baptise M.D.   On: 04/03/2018 09:35   Ct Head Wo Contrast  Result Date: 04/02/2018 CLINICAL DATA:  Status post cardiac arrest with altered mental status EXAM: CT HEAD WITHOUT CONTRAST TECHNIQUE: Contiguous axial images were obtained from the base of the skull through the vertex without intravenous contrast. COMPARISON:  January 11, 2017 FINDINGS: Brain: There is age related volume loss. There is no intracranial mass, hemorrhage, extra-axial fluid collection, or midline shift. Slight periventricular small vessel disease is stable.  There is no new gray-white compartment lesion. No evident acute infarct. Basal ganglia calcification is stable and felt to be physiologic in this age group. Vascular: No evident hyperdense vessel. There is calcification in the distal vertebral arteries and carotid siphon regions bilaterally. Skull: The bony calvarium appears intact. There is extensive ossification in the falx, a stable finding. Sinuses/Orbits: There is mucosal thickening in the right maxillary antrum and to a much lesser extent in the left maxillary antrum. There is diffuse opacification of the left sphenoid sinus with extensive mucosal thickening in the right sphenoid sinus. There is a retention cyst in the posterior left ethmoid region. There is opacification of several other ethmoid air cells. Orbits appear symmetric bilaterally. Other: Mastoid air cells are clear. Hearing assist devices are present in each external auditory canal. There is probable cerumen in the right external auditory canal. There is advanced arthropathy in the temporomandibular joints with flattening of the mandibular condyles bilaterally. There is intra-articular calcification in the left temporomandibular joint. IMPRESSION: 1. Age related volume loss with slight periventricular small vessel disease. No acute infarct. No mass or hemorrhage. 2.  Multiple foci of arterial vascular calcification. 3. Extensive multifocal paranasal sinus disease. Probable cerumen in the right external auditory canal. 4. 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 Electronically Signed   By: Lowella Grip III M.D.   On: 04/02/2018 20:04   Dg Chest Port 1 View  Result Date: 04/03/2018 CLINICAL DATA:  Check endotracheal tube placement EXAM: PORTABLE CHEST 1 VIEW COMPARISON:  04/02/2018 FINDINGS: Cardiac shadow is within normal limits. Lungs are well aerated bilaterally. New increased density in the right lung base is noted consistent with  early infiltrate. Endotracheal tube and nasogastric catheter are again seen and stable. Aortic calcifications are noted. No bony abnormality is seen. IMPRESSION: Right basilar infiltrate new from the prior exam. Electronically Signed   By: Linus Mako.D.  On: 04/03/2018 08:33   Dg Chest Portable 1 View  Result Date: 04/02/2018 CLINICAL DATA:  Endotracheal tube adjustment EXAM: PORTABLE CHEST 1 VIEW COMPARISON:  Chest radiograph 04/02/2018 at 7:06 p.m. FINDINGS: Slight retraction of endotracheal tube, which is slightly above the carina. Retraction by 3 cm would place the tip at the level of the clavicular heads. The enteric tube tip is below the field of view. Redemonstration of multiple rib fractures. No pneumothorax. Bibasilar atelectasis. Mild cardiomegaly. IMPRESSION: Endotracheal tube tip now above the carina. Retraction by another 3 cm would place the tip at the level of the clavicular heads. Electronically Signed   By: Ulyses Jarred M.D.   On: 04/02/2018 19:49   Dg Chest Port 1 View  Result Date: 04/02/2018 CLINICAL DATA:  Post cardiac arrest EXAM: PORTABLE CHEST 1 VIEW COMPARISON:  12/20/2017, 12/04/2015 FINDINGS: Postsurgical changes of the mediastinum. Tip of the endotracheal tube encroaches the right mainstem bronchus origin. No focal opacity or pleural effusion. Mild cardiomegaly with aortic atherosclerosis. No pneumothorax is seen. Acute left first second and third rib fractures. Acute appearing right first and second rib fractures. IMPRESSION: 1. Endotracheal tube tip overlies the origin of right mainstem bronchus. Study nodes indicate that the ETT will be adjusted and a repeat image obtained. 2. Mild cardiomegaly 3. Bilateral upper rib fractures.  No definitive pneumothorax. Electronically Signed   By: Donavan Foil M.D.   On: 04/02/2018 19:26     LOS: 12 days   Flora Lipps, MD  [facility]  If 7PM-7AM, please contact night-coverage  Please page via  [email or web address] [email or web address]-click on MD name and type text message  04/14/2018, 9:09 AM

## 2018-04-14 NOTE — Progress Notes (Signed)
[facility] Progress Note  Subjective: pt w/o complaints, seen on HD.  No sob / cough or CP.     Physical Exam: Vitals:   04/14/18 1000 04/14/18 1030 04/14/18 1100 04/14/18 1130  BP: (!) 84/40 (!) 99/38 (!) 71/31 (!) 88/24  Pulse: 64 71 71 76  Resp:      Temp:      TempSrc:      SpO2:      Weight:      Height:        Inpatient medications: . aspirin  81 mg Oral Daily  . atorvastatin  40 mg Oral q1800  . B-complex with vitamin C  1 tablet Per Tube Daily  . brimonidine  1 drop Left Eye TID  . Chlorhexidine Gluconate Cloth  6 each Topical Q0600  . darbepoetin (ARANESP) injection - DIALYSIS  100 mcg Intravenous Q Thu-HD  . feeding supplement (NEPRO CARB STEADY)  237 mL Oral BID BM  . heparin  5,000 Units Subcutaneous Q8H  . hydrALAZINE  25 mg Oral Q8H  . insulin aspart  2-6 Units Subcutaneous TID WC  . insulin glargine  15 Units Subcutaneous Daily  . latanoprost  1 drop Both Eyes QHS  . mouth rinse  15 mL Mouth Rinse BID  . nebivolol  2.5 mg Oral Daily  . ticagrelor  90 mg Oral BID  . timolol  1 drop Left Eye Daily   . ferric gluconate (FERRLECIT/NULECIT) IV 125 mg (04/14/18 1109)   acetaminophen, alum & mag hydroxide-simeth, bisacodyl, calcium carbonate, docusate, Gerhardt's butt cream, guaiFENesin-dextromethorphan, ondansetron (ZOFRAN) IV Exam: General: Lying tired elderly debilitated WF obese Heart:RRR, s1s2 nl Lungs: Clear bilateral, good air entry, no wheezing Abdomen: Soft, nontender, nondistended Extremities: No edema Dialysis Access: Left upper extremity AV graft has good bruit.   Dialysis:[facility]  4h 76kg 3K/ 2Ca bath LUA AVG Hep none - mircera 100 q 2   Impression/ Plan: 1) VF arrest: Positive troponin echo with new wall motion abnormalities. LVEF 40-45%.  S/p cath on 9/25 with stent placement/ PCI of SVG to RCA. Now on aspirin and brilinta for at least 12 months. Seen by EP, stable.   2) Vent tachycardia nonsustained, torsades:  9/26- 9/27, reperfusion event after PCI, per EP/ cards. No indication for ICD / PPM. Resolved.  3) ESRD: TTS HD. Next dialysis today.  Left upper extremity AV graft  4) Anemia: Hemoglobin around 9. Iron sat 15 %, ferritin 1432, on weekly nulecit. On mircera.  5) Secondary hyperparathyroidism: monitor ca, phos,   6) HTN/volume: BP acceptable.  On nevibolol and hydralazine.  Volume OK  7) DNR: family and pt request.  Cont dialysis for now.    [name] MD [facility] pager [phone]   04/14/2018, 12:42 PM   Recent Labs  Lab 04/11/18 0844 04/12/18 0910 04/14/18 0221  NA 135 135 134*  K 4.0 3.6 4.1  CL 94* 96* 94*  CO2 25 27 19*  GLUCOSE 115* 115* 138*  BUN 34* 22 47*  CREATININE 4.69* 3.37* 5.88*  CALCIUM 8.9 8.9 9.0  PHOS 6.7*  --  7.2*  ALBUMIN 2.2*  --  2.5*   No results for input(s): AST, ALT, ALKPHOS, BILITOT, PROT in the last 168 hours. Recent Labs  Lab 04/12/18 0910 04/14/18 0221  WBC 11.3* 13.1*  HGB 9.4* 10.3*  HCT 30.3* 32.1*  MCV 107.1* 104.6*  PLT 186 230

## 2018-04-14 NOTE — Progress Notes (Signed)
Inpatient Rehabilitation Admissions Coordinator  I will follow up tomorrow for possible admission to inpt rehab once Cardiology clears for d/c to inpt rehab.   Danne Baxter, RN, MSN Rehab Admissions Coordinator [phone] 04/14/2018 5:20 PM

## 2018-04-14 NOTE — Progress Notes (Signed)
Patient off floor to dialysis

## 2018-04-15 ENCOUNTER — Encounter (HOSPITAL_COMMUNITY): Payer: Self-pay

## 2018-04-15 ENCOUNTER — Inpatient Hospital Stay (HOSPITAL_COMMUNITY)
Admission: RE | Admit: 2018-04-15 | Discharge: 2018-04-28 | DRG: 945 | Disposition: A | Discharge status: Home Health | Payer: Medicare Other | Source: Intra-hospital | Attending: Physical Medicine & Rehabilitation | Admitting: Physical Medicine & Rehabilitation

## 2018-04-15 DIAGNOSIS — N186 End stage renal disease: Secondary | ICD-10-CM | POA: Diagnosis present

## 2018-04-15 DIAGNOSIS — E1122 Type 2 diabetes mellitus with diabetic chronic kidney disease: Secondary | ICD-10-CM | POA: Diagnosis present

## 2018-04-15 DIAGNOSIS — Z8349 Family history of other endocrine, nutritional and metabolic diseases: Secondary | ICD-10-CM | POA: Diagnosis not present

## 2018-04-15 DIAGNOSIS — I10 Essential (primary) hypertension: Secondary | ICD-10-CM | POA: Diagnosis not present

## 2018-04-15 DIAGNOSIS — G931 Anoxic brain damage, not elsewhere classified: Secondary | ICD-10-CM | POA: Diagnosis present

## 2018-04-15 DIAGNOSIS — I4891 Unspecified atrial fibrillation: Secondary | ICD-10-CM | POA: Diagnosis present

## 2018-04-15 DIAGNOSIS — D638 Anemia in other chronic diseases classified elsewhere: Secondary | ICD-10-CM

## 2018-04-15 DIAGNOSIS — D62 Acute posthemorrhagic anemia: Secondary | ICD-10-CM

## 2018-04-15 DIAGNOSIS — I472 Ventricular tachycardia, unspecified: Secondary | ICD-10-CM

## 2018-04-15 DIAGNOSIS — E1142 Type 2 diabetes mellitus with diabetic polyneuropathy: Secondary | ICD-10-CM

## 2018-04-15 DIAGNOSIS — E119 Type 2 diabetes mellitus without complications: Secondary | ICD-10-CM

## 2018-04-15 DIAGNOSIS — Z8249 Family history of ischemic heart disease and other diseases of the circulatory system: Secondary | ICD-10-CM

## 2018-04-15 DIAGNOSIS — N2581 Secondary hyperparathyroidism of renal origin: Secondary | ICD-10-CM | POA: Diagnosis present

## 2018-04-15 DIAGNOSIS — R001 Bradycardia, unspecified: Secondary | ICD-10-CM

## 2018-04-15 DIAGNOSIS — I252 Old myocardial infarction: Secondary | ICD-10-CM

## 2018-04-15 DIAGNOSIS — R5381 Other malaise: Secondary | ICD-10-CM

## 2018-04-15 DIAGNOSIS — R0989 Other specified symptoms and signs involving the circulatory and respiratory systems: Secondary | ICD-10-CM

## 2018-04-15 DIAGNOSIS — Z794 Long term (current) use of insulin: Secondary | ICD-10-CM

## 2018-04-15 DIAGNOSIS — E785 Hyperlipidemia, unspecified: Secondary | ICD-10-CM | POA: Diagnosis present

## 2018-04-15 DIAGNOSIS — Z823 Family history of stroke: Secondary | ICD-10-CM | POA: Diagnosis not present

## 2018-04-15 DIAGNOSIS — A0472 Enterocolitis due to Clostridium difficile, not specified as recurrent: Secondary | ICD-10-CM | POA: Diagnosis present

## 2018-04-15 DIAGNOSIS — Z992 Dependence on renal dialysis: Secondary | ICD-10-CM | POA: Diagnosis not present

## 2018-04-15 DIAGNOSIS — Z7982 Long term (current) use of aspirin: Secondary | ICD-10-CM

## 2018-04-15 DIAGNOSIS — D631 Anemia in chronic kidney disease: Secondary | ICD-10-CM | POA: Diagnosis present

## 2018-04-15 DIAGNOSIS — Z833 Family history of diabetes mellitus: Secondary | ICD-10-CM

## 2018-04-15 DIAGNOSIS — Z8674 Personal history of sudden cardiac arrest: Secondary | ICD-10-CM | POA: Diagnosis not present

## 2018-04-15 DIAGNOSIS — E1165 Type 2 diabetes mellitus with hyperglycemia: Secondary | ICD-10-CM | POA: Diagnosis not present

## 2018-04-15 DIAGNOSIS — I120 Hypertensive chronic kidney disease with stage 5 chronic kidney disease or end stage renal disease: Secondary | ICD-10-CM | POA: Diagnosis present

## 2018-04-15 DIAGNOSIS — Z951 Presence of aortocoronary bypass graft: Secondary | ICD-10-CM

## 2018-04-15 DIAGNOSIS — K580 Irritable bowel syndrome with diarrhea: Secondary | ICD-10-CM | POA: Diagnosis present

## 2018-04-15 DIAGNOSIS — K649 Unspecified hemorrhoids: Secondary | ICD-10-CM | POA: Diagnosis present

## 2018-04-15 DIAGNOSIS — I2510 Atherosclerotic heart disease of native coronary artery without angina pectoris: Secondary | ICD-10-CM | POA: Diagnosis present

## 2018-04-15 DIAGNOSIS — F329 Major depressive disorder, single episode, unspecified: Secondary | ICD-10-CM | POA: Diagnosis present

## 2018-04-15 DIAGNOSIS — H409 Unspecified glaucoma: Secondary | ICD-10-CM | POA: Diagnosis present

## 2018-04-15 DIAGNOSIS — K219 Gastro-esophageal reflux disease without esophagitis: Secondary | ICD-10-CM | POA: Diagnosis present

## 2018-04-15 DIAGNOSIS — Z8673 Personal history of transient ischemic attack (TIA), and cerebral infarction without residual deficits: Secondary | ICD-10-CM

## 2018-04-15 LAB — CBC
HCT: 29.7 % — ABNORMAL LOW (ref 36.0–46.0)
HEMATOCRIT: 29.5 % — AB (ref 36.0–46.0)
HEMOGLOBIN: 9.3 g/dL — AB (ref 12.0–15.0)
Hemoglobin: 9.3 g/dL — ABNORMAL LOW (ref 12.0–15.0)
MCH: 33.6 pg (ref 26.0–34.0)
MCH: 33.8 pg (ref 26.0–34.0)
MCHC: 31.5 g/dL (ref 30.0–36.0)
MCHC: 31.3 g/dL (ref 30.0–36.0)
MCV: 106.5 fL — AB (ref 78.0–100.0)
MCV: 108 fL — AB (ref 78.0–100.0)
PLATELETS: 186 10*3/uL (ref 150–400)
Platelets: 208 10*3/uL (ref 150–400)
RBC: 2.77 MIL/uL — ABNORMAL LOW (ref 3.87–5.11)
RBC: 2.75 MIL/uL — AB (ref 3.87–5.11)
RDW: 14.4 % (ref 11.5–15.5)
RDW: 14.4 % (ref 11.5–15.5)
WBC: 9 10*3/uL (ref 4.0–10.5)
WBC: 9.8 10*3/uL (ref 4.0–10.5)

## 2018-04-15 LAB — BASIC METABOLIC PANEL
ANION GAP: 14 (ref 5–15)
BUN: 17 mg/dL (ref 8–23)
CHLORIDE: 95 mmol/L — AB (ref 98–111)
CO2: 27 mmol/L (ref 22–32)
Calcium: 8.7 mg/dL — ABNORMAL LOW (ref 8.9–10.3)
Creatinine, Ser: 3.21 mg/dL — ABNORMAL HIGH (ref 0.44–1.00)
GFR calc Af Amer: 15 mL/min — ABNORMAL LOW (ref >60)
GFR calc non Af Amer: 13 mL/min — ABNORMAL LOW (ref >60)
GLUCOSE: 95 mg/dL (ref 70–99)
POTASSIUM: 3.3 mmol/L — AB (ref 3.5–5.1)
Sodium: 136 mmol/L (ref 135–145)

## 2018-04-15 LAB — GLUCOSE, CAPILLARY
GLUCOSE-CAPILLARY: 159 mg/dL — AB (ref 70–99)
GLUCOSE-CAPILLARY: 182 mg/dL — AB (ref 70–99)
Glucose-Capillary: 89 mg/dL (ref 70–99)
Glucose-Capillary: 125 mg/dL — ABNORMAL HIGH (ref 70–99)

## 2018-04-15 LAB — MAGNESIUM: Magnesium: 2 mg/dL (ref 1.7–2.4)

## 2018-04-15 LAB — CREATININE, SERUM
Creatinine, Ser: 4.43 mg/dL — ABNORMAL HIGH (ref 0.44–1.00)
GFR calc Af Amer: 10 mL/min — ABNORMAL LOW (ref >60)
GFR calc non Af Amer: 9 mL/min — ABNORMAL LOW (ref >60)

## 2018-04-15 MED ORDER — LATANOPROST 0.005 % OP SOLN
1.0000 [drp] | Freq: Every day | OPHTHALMIC | Status: DC
  Administered 2018-04-15 – 2018-04-27 (×13): 1 [drp] via OPHTHALMIC
  Filled 2018-04-15: qty 2.5

## 2018-04-15 MED ORDER — DARBEPOETIN ALFA 100 MCG/0.5ML IJ SOSY
100.0000 ug | PREFILLED_SYRINGE | INTRAMUSCULAR | Status: DC
  Administered 2018-04-16: 100 ug via INTRAVENOUS
  Filled 2018-04-15: qty 0.5

## 2018-04-15 MED ORDER — GERHARDT'S BUTT CREAM
TOPICAL_CREAM | CUTANEOUS | Status: DC | PRN
  Administered 2018-04-15 – 2018-04-18 (×5): via TOPICAL
  Administered 2018-04-18: 1 via TOPICAL
  Administered 2018-04-19 – 2018-04-27 (×8): via TOPICAL
  Filled 2018-04-15 (×5): qty 1

## 2018-04-15 MED ORDER — ACETAMINOPHEN 325 MG PO TABS
650.0000 mg | ORAL_TABLET | Freq: Four times a day (QID) | ORAL | Status: DC | PRN
  Administered 2018-04-16 – 2018-04-28 (×19): 650 mg via ORAL
  Filled 2018-04-15 (×20): qty 2

## 2018-04-15 MED ORDER — B COMPLEX-C PO TABS
1.0000 | ORAL_TABLET | Freq: Every day | ORAL | Status: DC
  Administered 2018-04-16 – 2018-04-28 (×13): 1
  Filled 2018-04-15 (×13): qty 1

## 2018-04-15 MED ORDER — BRIMONIDINE TARTRATE 0.15 % OP SOLN
1.0000 [drp] | Freq: Three times a day (TID) | OPHTHALMIC | Status: DC
  Administered 2018-04-15 – 2018-04-28 (×33): 1 [drp] via OPHTHALMIC
  Filled 2018-04-15: qty 5

## 2018-04-15 MED ORDER — NEPRO/CARBSTEADY PO LIQD
237.0000 mL | Freq: Two times a day (BID) | ORAL | Status: DC
  Administered 2018-04-16 – 2018-04-27 (×16): 237 mL via ORAL

## 2018-04-15 MED ORDER — CALCIUM CARBONATE ANTACID 500 MG PO CHEW
1.0000 | CHEWABLE_TABLET | Freq: Four times a day (QID) | ORAL | Status: DC | PRN
  Administered 2018-04-19 – 2018-04-28 (×13): 200 mg via ORAL
  Filled 2018-04-15 (×12): qty 1

## 2018-04-15 MED ORDER — TIMOLOL MALEATE 0.5 % OP SOLN
1.0000 [drp] | Freq: Every day | OPHTHALMIC | Status: DC
  Administered 2018-04-15 – 2018-04-27 (×13): 1 [drp] via OPHTHALMIC
  Filled 2018-04-15: qty 5

## 2018-04-15 MED ORDER — HYDRALAZINE HCL 25 MG PO TABS
25.0000 mg | ORAL_TABLET | Freq: Three times a day (TID) | ORAL | Status: DC
  Administered 2018-04-15 – 2018-04-28 (×28): 25 mg via ORAL
  Filled 2018-04-15 (×33): qty 1

## 2018-04-15 MED ORDER — SODIUM CHLORIDE 0.9 % IV SOLN
125.0000 mg | INTRAVENOUS | Status: DC
  Administered 2018-04-28: 125 mg via INTRAVENOUS
  Filled 2018-04-15 (×4): qty 10

## 2018-04-15 MED ORDER — ASPIRIN 81 MG PO CHEW
81.0000 mg | CHEWABLE_TABLET | Freq: Every day | ORAL | Status: DC
  Administered 2018-04-16 – 2018-04-28 (×13): 81 mg via ORAL
  Filled 2018-04-15 (×13): qty 1

## 2018-04-15 MED ORDER — INSULIN ASPART 100 UNIT/ML ~~LOC~~ SOLN
2.0000 [IU] | Freq: Three times a day (TID) | SUBCUTANEOUS | Status: DC
  Administered 2018-04-15: 4 [IU] via SUBCUTANEOUS
  Administered 2018-04-16: 6 [IU] via SUBCUTANEOUS
  Administered 2018-04-17 – 2018-04-18 (×2): 4 [IU] via SUBCUTANEOUS
  Administered 2018-04-19: 2 [IU] via SUBCUTANEOUS
  Administered 2018-04-19 – 2018-04-22 (×4): 4 [IU] via SUBCUTANEOUS
  Administered 2018-04-23: 2 [IU] via SUBCUTANEOUS
  Administered 2018-04-24: 4 [IU] via SUBCUTANEOUS
  Administered 2018-04-24: 2 [IU] via SUBCUTANEOUS
  Administered 2018-04-25: 4 [IU] via SUBCUTANEOUS
  Administered 2018-04-26: 2 [IU] via SUBCUTANEOUS
  Administered 2018-04-26 – 2018-04-27 (×2): 4 [IU] via SUBCUTANEOUS
  Administered 2018-04-27: 2 [IU] via SUBCUTANEOUS

## 2018-04-15 MED ORDER — TICAGRELOR 90 MG PO TABS
90.0000 mg | ORAL_TABLET | Freq: Two times a day (BID) | ORAL | Status: DC
  Administered 2018-04-15 – 2018-04-28 (×26): 90 mg via ORAL
  Filled 2018-04-15 (×26): qty 1

## 2018-04-15 MED ORDER — CHLORHEXIDINE GLUCONATE CLOTH 2 % EX PADS: 6.0000 | MEDICATED_PAD | Freq: Every day | CUTANEOUS | Status: DC

## 2018-04-15 MED ORDER — HEPARIN SODIUM (PORCINE) 5000 UNIT/ML IJ SOLN: 5000.0000 [IU] | Freq: Three times a day (TID) | INTRAMUSCULAR | Status: DC

## 2018-04-15 MED ORDER — INSULIN GLARGINE 100 UNIT/ML ~~LOC~~ SOLN
15.0000 [IU] | Freq: Every day | SUBCUTANEOUS | Status: DC
  Administered 2018-04-16 – 2018-04-27 (×11): 15 [IU] via SUBCUTANEOUS
  Filled 2018-04-15 (×14): qty 0.15

## 2018-04-15 MED ORDER — GUAIFENESIN-DM 100-10 MG/5ML PO SYRP
5.0000 mL | ORAL_SOLUTION | ORAL | Status: DC | PRN
  Administered 2018-04-15 – 2018-04-17 (×2): 5 mL via ORAL
  Filled 2018-04-15 (×2): qty 5

## 2018-04-15 MED ORDER — HEPARIN SODIUM (PORCINE) 5000 UNIT/ML IJ SOLN
5000.0000 [IU] | Freq: Three times a day (TID) | INTRAMUSCULAR | Status: DC
  Administered 2018-04-15 – 2018-04-18 (×7): 5000 [IU] via SUBCUTANEOUS
  Filled 2018-04-15 (×7): qty 1

## 2018-04-15 MED ORDER — ATORVASTATIN CALCIUM 40 MG PO TABS
40.0000 mg | ORAL_TABLET | Freq: Every day | ORAL | Status: DC
  Administered 2018-04-15 – 2018-04-27 (×13): 40 mg via ORAL
  Filled 2018-04-15 (×13): qty 1

## 2018-04-15 NOTE — Progress Notes (Signed)
Inpatient Rehabilitation Admissions Coordinator  I contacted Dr. Johnsie Cancel to clarify medical readiness to admit to CIR today as well as Dr. Maryland Pink. Both in agreement to d/c to inpt rehab. I will make the arrangements to admit today. I notified dialysis, Caryl Pina, of pt's d/c to CIR today. I met with patient , spouse and daughter at bedside. They are in agreement to admit today.   Danne Baxter, RN, MSN Rehab Admissions Coordinator [phone] 04/15/2018 12:33 PM

## 2018-04-15 NOTE — Progress Notes (Signed)
Pt admit to 4west room 5 from 2C09. Oriented to room and rehab services, spouse and granddaughter accompanying pt. Call bell in reach, bed alarm and side rails up. Pt instruct to call for assist.

## 2018-04-15 NOTE — Plan of Care (Signed)
  Problem: Education: Goal: Knowledge of General Education information will improve Description Including pain rating scale, medication(s)/side effects and non-pharmacologic comfort measures Outcome: Progressing   Problem: Clinical Measurements: Goal: Ability to maintain clinical measurements within normal limits will improve Outcome: Progressing Goal: Will remain free from infection Outcome: Progressing Goal: Diagnostic test results will improve Outcome: Progressing Goal: Cardiovascular complication will be avoided Outcome: Progressing   Problem: Education: Goal: Understanding of cardiac disease, CV risk reduction, and recovery process will improve Outcome: Progressing Goal: Understanding of medication regimen will improve Outcome: Progressing   Problem: Activity: Goal: Ability to tolerate increased activity will improve Outcome: Progressing   Problem: Health Behavior/Discharge Planning: Goal: Ability to safely manage health-related needs after discharge will improve Outcome: Progressing

## 2018-04-15 NOTE — Progress Notes (Signed)
Was just notified by CMT pt had a 6.04sec pause,notified cardiology Roby Lofts PA. bistolic d/c'd. Will continue to monitor.

## 2018-04-15 NOTE — Plan of Care (Signed)
  Problem: Spiritual Needs Goal: Ability to function at adequate level Outcome: Not Progressing   Problem: Consults Goal: [facility] GENERAL PATIENT EDUCATION Description See Patient Education module for education specifics. Outcome: Not Progressing Goal: Skin Care Protocol Initiated - if Braden Score 18 or less Description If consults are not indicated, leave blank or document N/A Outcome: Not Progressing Goal: Nutrition Consult-if indicated Outcome: Not Progressing Goal: Diabetes Guidelines if Diabetic/Glucose > 140 Description If diabetic or lab glucose is > 140 mg/dl - Initiate Diabetes/Hyperglycemia Guidelines & Document Interventions  Outcome: Not Progressing   Problem: RH BOWEL ELIMINATION Goal: RH STG MANAGE BOWEL WITH ASSISTANCE Description STG Manage Bowel with min Assistance.  Outcome: Not Progressing Goal: RH STG MANAGE BOWEL W/MEDICATION W/ASSISTANCE Description STG Manage Bowel with Medication with min  Assistance.  Outcome: Not Progressing   Problem: RH SKIN INTEGRITY Goal: RH STG SKIN FREE OF INFECTION/BREAKDOWN Description Min A  Outcome: Not Progressing Goal: RH STG MAINTAIN SKIN INTEGRITY WITH ASSISTANCE Description STG Maintain Skin Integrity With min  Assistance.  Outcome: Not Progressing Goal: RH STG ABLE TO PERFORM INCISION/WOUND CARE W/ASSISTANCE Description STG Able To Perform Incision/Wound Care With min  Assistance.  Outcome: Not Progressing   Problem: RH SAFETY Goal: RH STG ADHERE TO SAFETY PRECAUTIONS W/ASSISTANCE/DEVICE Description STG Adhere to Safety Precautions With min  Assistance/Device.  Outcome: Not Progressing   Problem: RH PAIN MANAGEMENT Goal: RH STG PAIN MANAGED AT OR BELOW PT'S PAIN GOAL Outcome: Not Progressing   Problem: RH KNOWLEDGE DEFICIT GENERAL Goal: RH STG INCREASE KNOWLEDGE OF SELF CARE AFTER HOSPITALIZATION  Outcome: Not Progressing   Pt admit to unit, care plan initiated

## 2018-04-15 NOTE — Progress Notes (Signed)
Debra Gong, RN  Rehab Admission Coordinator  Physical Medicine and Rehabilitation  PMR Pre-admission  Addendum  Date of Service:  04/09/2018 4:32 PM       Related encounter: ED to Hosp-Admission (Discharged) from 04/02/2018 in [place]         Show:Clear all [x] Manual[x] Template[x] Copied  Added by: [x] Debra Gong, RN  [] Hover for details PMR Admission Coordinator Pre-Admission Assessment  Patient: [name] is an 77 y.o., female MRN: [id number] DOB: September 17, 1940 Height: 5\' 4"  (162.6 cm) Weight: 72.8 kg                                                                                                                                                  Insurance Information HMO:     PPO:      PCP:      IPA:      80/20:      OTHER: no HMO PRIMARY: Medicare a and b      Policy#: [id number]      Subscriber: pt Benefits:  Phone #: passport one online     Name: 04/09/2018 Eff. Date: 11/12/2004     Deduct: $1364      Out of Pocket Max: none      Life Max: none CIR: 100%      SNF: 20 full days Outpatient: 80%     Co-Pay: 20% Home Health: 100%      Co-Pay: none DME: 80%     Co-Pay: 20% Providers: pt choice  SECONDARY: BCBS of [place] supplement      Policy#: [id number]      Subscriber: pt  Medicaid Application Date:       Case Manager:  Disability Application Date:       Case Worker:   Emergency Contact Information         Contact Information    Name Relation Home Work [place]  [phone]   [name] Daughter [phone]       Current Medical History  Patient Admitting Diagnosis: debility, 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 Espinoza is a 77 year old right-handed female history ofirritable bowel syndrome,end-stage renal disease with hemodialysis recently initiated approximately 3 to 4 weeks ago, atrial fibrillation, CAD with CABG  2013 maintained on aspirin, left carotid enterectomy, hypertension, diabetes mellitus as well as history of lumbar radiculopathy with L5-S1 microdiscectomy receiving inpatient rehab services May 2017.  Presented 04/02/2018 after witnessed out of hospital cardiac arrest. CPR initiated. EMS arrived within 10 minutes found in V. fib and intubated in the field. Noted systolic blood pressure in the 220s. Troponin 1.17, lactic acid 5.11, WBC 18,900. Placed on heparin drip. Echocardiogram with ejection fraction of 50% akinesis of the basal mid inferior lateral and inferior myocardium grade 1 diastolic dysfunction. Placed  on broad-spectrum antibiotics for elevated WBC question aspiration pneumonia. Cardiology follow-up cardiac catheterization 04/08/2018 that showed proximal LCx lesion 100% stenosis in proximal LAD 95% stenosis. Patient underwent successful stenting per Dr.Varanasiand maintained on aspirin and Brilinta therapy. Hemodialysis ongoing as per renal services. Hospital course positive C. difficile with Flexi-Seal placed contact precautions completing course of vancomycin for C. difficile. Subcutaneous heparin for DVT prophylaxis. Patient did remain intubated through 04/04/2018. Patient with episode of bradycardia 9/27/2019and remained asymptomaticwith follow-up per cardiology services with EKG showing P-P prolongation noted while coughingand also episode while sleepingbeta-blocker was discontinued. Bistolic discontinued today 10/2 due to continued pauses by cardiology service. Patient has been made a DNR by Nephrology for she was DNR at outpatient dialysis v=center when began dialysis.  Past Medical History      Past Medical History:  Diagnosis Date  . Anemia   . Arthritis   . Atrial fibrillation ([place])   . Blood transfusion   . CAD (coronary artery disease)   . Cancer ([place])    .  top of head- melonoma  . Carotid artery occlusion    Carotid Endartectom,y - left 2009.   Blockage Right being watched by Dr Scot Dock.  . Carotid stenosis   . 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 Complication of anesthesia    pt. states that she was difficult to wake  . Depression   . Diabetes mellitus without complication ([place])   . Dysrhythmia   . General weakness 12/2015  . GERD (gastroesophageal reflux disease)   . Glaucoma   . History of hiatal hernia   . History of kidney stones    passed  . History of pneumonia   . HOH (hard of hearing)   . Hypertension   . Hypokalemia 12/2015  . Myocardial infarction ([place])   . Neuromuscular disorder ([place])    CARPEL TUNNEL  . Pneumonia   . Shortness of breath   . Stroke ([place])    hx of TIA   Family History  family history includes Cancer in her father; Deep vein thrombosis in her daughter; Diabetes in her daughter and mother; Heart attack in her mother; Heart disease in her daughter and mother; Hyperlipidemia in her daughter and father; Hypertension in her daughter, father, and mother; Peripheral vascular disease in her daughter; Stroke in her mother.  Prior Rehab/Hospitalizations:  Has the patient had major surgery during 100 days prior to admission? No  Previous CIR 11/2015  With Dr. Charm Barges team after back surgery for 11 days and discharged home with [facility].  Current Medications   Current Facility-Administered Medications:  .  acetaminophen (TYLENOL) tablet 650 mg, 650 mg, Oral, Q6H PRN, Jettie Booze, MD, 650 mg at 04/15/18 1048 .  alum & mag hydroxide-simeth (MAALOX/MYLANTA) 200-200-20 MG/5ML suspension 15 mL, 15 mL, Oral, Q6H PRN, Larae Grooms S, MD .  aspirin chewable tablet 81 mg, 81 mg, Oral, Daily, Jettie Booze, MD, 81 mg at 04/15/18 1032 .  atorvastatin (LIPITOR) tablet 40 mg, 40 mg, Oral, q1800, Jettie Booze, MD, 40 mg at 04/14/18 1727 .  B-complex with vitamin C tablet 1 tablet, 1 tablet, Per Tube, Daily, Jettie Booze, MD, 1 tablet at  04/15/18 1032 .  bisacodyl (DULCOLAX) suppository 10 mg, 10 mg, Rectal, Daily PRN, Larae Grooms S, MD .  brimonidine (ALPHAGAN) 0.15 % ophthalmic solution 1 drop, 1 drop, Left Eye, TID, Jettie Booze, MD, 1 drop at 04/15/18 1040 .  calcium carbonate (TUMS -  dosed in mg elemental calcium) chewable tablet 200 mg of elemental calcium, 1 tablet, Oral, Q6H PRN, Jettie Booze, MD, 200 mg of elemental calcium at 04/13/18 2217 .  Chlorhexidine Gluconate Cloth 2 % PADS 6 each, 6 each, Topical, Q0600, Roney Jaffe, MD, 6 each at 04/15/18 [phone] .  Darbepoetin Alfa (ARANESP) injection 100 mcg, 100 mcg, Intravenous, Q Thu-HD, Rosita Fire, MD, 100 mcg at 04/09/18 1316 .  docusate (COLACE) 50 MG/5ML liquid 100 mg, 100 mg, Per Tube, BID PRN, Jettie Booze, MD .  feeding supplement (NEPRO CARB STEADY) liquid 237 mL, 237 mL, Oral, BID BM, Pokhrel, Laxman, MD .  ferric gluconate (NULECIT) 125 mg in sodium chloride 0.9 % 100 mL IVPB, 125 mg, Intravenous, Q Tue-HD, Jettie Booze, MD, Stopped at 04/14/18 1209 .  Gerhardt's butt cream, , Topical, PRN, Jettie Booze, MD .  guaiFENesin-dextromethorphan [facility] DM) 100-10 MG/5ML syrup 5 mL, 5 mL, Oral, Q4H PRN, Roney Jaffe, MD, 5 mL at 04/14/18 2258 .  heparin injection 5,000 Units, 5,000 Units, Subcutaneous, Q8H, Jettie Booze, MD, 5,000 Units at 04/15/18 [phone] .  hydrALAZINE (APRESOLINE) tablet 25 mg, 25 mg, Oral, Q8H, Rosita Fire, MD, 25 mg at 04/15/18 0651 .  insulin aspart (novoLOG) injection 2-6 Units, 2-6 Units, Subcutaneous, TID WC, Roney Jaffe, MD, 2 Units at 04/14/18 1726 .  insulin glargine (LANTUS) injection 15 Units, 15 Units, Subcutaneous, Daily, Jettie Booze, MD, 15 Units at 04/15/18 1039 .  latanoprost (XALATAN) 0.005 % ophthalmic solution 1 drop, 1 drop, Both Eyes, QHS, Jettie Booze, MD, 1 drop at 04/14/18 2258 .  MEDLINE mouth rinse, 15 mL, Mouth Rinse, BID,  Jettie Booze, MD, 15 mL at 04/12/18 2215 .  ondansetron (ZOFRAN) injection 4 mg, 4 mg, Intravenous, Q6H PRN, Jettie Booze, MD .  ticagrelor [facility]) tablet 90 mg, 90 mg, Oral, BID, Jettie Booze, MD, 90 mg at 04/15/18 1032 .  timolol (TIMOPTIC) 0.5 % ophthalmic solution 1 drop, 1 drop, Left Eye, Daily, Jettie Booze, MD, 1 drop at 04/14/18 2258  Patients Current Diet:     Diet Order                  Diet Carb Modified Fluid consistency: Thin; Room service appropriate? Yes  Diet effective now               Precautions / Restrictions Precautions Precautions: Fall Precaution Comments: rectal tube Restrictions Weight Bearing Restrictions: No   Has the patient had 2 or more falls or a fall with injury in the past year?Yes  Prior Activity Level Community (5-7x/wk): very active and independent until 2 months ago. Recent start to hemodialyis.   Home Assistive Devices / Equipment Home Assistive Devices/Equipment: None Home Equipment: Environmental consultant - 2 wheels, [place] - single point, Bedside commode, Shower seat, Grab bars - toilet  Prior Device Use: Indicate devices/aids used by the patient prior to current illness, exacerbation or injury? Walker and cane as needed  Prior Functional Level Prior Function Level of Independence: (Mod I with cane and RW as needed) Comments: Uses rolling walker or cane. Frequent falls. Shares the housework with her husband.   Decline in function over past 2 months as renal function declined. Began Hemodialysis 3 to 4 weeks ago. Prior to that she was mowing lawn, Independent adls, cooking, etc.   Self Care: Did the patient need help bathing, dressing, using the toilet or eating?  Independent  Indoor Mobility: Did  the patient need assistance with walking from room to room (with or without device)? Independent  Stairs: Did the patient need assistance with internal or external stairs (with or without device)?  Independent  Functional Cognition: Did the patient need help planning regular tasks such as shopping or remembering to take medications? Independent  Current Functional Level Cognition  Overall Cognitive Status: Within Functional Limits for tasks assessed Orientation Level: Oriented X4 General Comments: pt requires increased time for movement. self-limiting by pain.     Extremity Assessment (includes Sensation/Coordination)  Upper Extremity Assessment: Generalized weakness  Lower Extremity Assessment: Defer to PT evaluation    ADLs  Overall ADL's : Needs assistance/impaired Eating/Feeding: Set up, Supervision/ safety, Sitting Grooming: Oral care, Brushing hair, Supervision/safety, Set up, Sitting Grooming Details (indicate cue type and reason): Sitting in recliner, pt performing oral care and brushed her hair. Supervision and Min VCs for encouragement Upper Body Bathing: Minimal assistance, Sitting Lower Body Bathing: Maximal assistance, Sit to/from stand Lower Body Bathing Details (indicate cue type and reason): Decreased ROM to bend forward due to chest pain Upper Body Dressing : Minimal assistance, Sitting Lower Body Dressing: Maximal assistance, Sit to/from stand Toilet Transfer: Minimal assistance, +2 for physical assistance, Ambulation, RW(Simulated to recliner) Toilet Transfer Details (indicate cue type and reason): Pt requiring Min A to power up into standing.  Functional mobility during ADLs: Minimal assistance, +2 for physical assistance, Rolling walker General ADL Comments: Pt presenting with decreased balance, strength, ROM, and activity tolerance.    Mobility  Overal bed mobility: Needs Assistance Bed Mobility: Rolling, Sidelying to Sit Rolling: Modified independent (Device/Increase time) Sidelying to sit: Min assist Supine to sit: Mod assist, +2 for physical assistance General bed mobility comments: assist to scoot hips to edge of bed with use of pad. 1 hha  to rise for side to sit.     Transfers  Overall transfer level: Needs assistance Equipment used: Rolling walker (2 wheeled) Transfers: Sit to/from Stand Sit to Stand: Min assist Stand pivot transfers: Min guard General transfer comment: cues for hand placement on bed and to reach back for chair. three standing trials, guard to rise, min assist while standing for quick fatigue, prolonged standing to get cleaned up. min guard for pivot to chair     Ambulation / Gait / Stairs / Wheelchair Mobility  Ambulation/Gait Ambulation/Gait assistance: Mod assist, +2 physical assistance Gait Distance (Feet): 2 Feet Assistive device: 2 person hand held assist Gait Pattern/deviations: Step-to pattern, Decreased stride length General Gait Details: side steps up to [facility] with fatigue and weakness with bilateral HHA Gait velocity: decr Gait velocity interpretation: <1.8 ft/sec, indicate of risk for recurrent falls    Posture / Balance Dynamic Sitting Balance Sitting balance - Comments: UE support Balance Overall balance assessment: Needs assistance Sitting-balance support: Feet supported Sitting balance-Leahy Scale: Fair Sitting balance - Comments: UE support Standing balance support: Bilateral upper extremity supported Standing balance-Leahy Scale: Poor Standing balance comment: bilateral UE support for standing    Special needs/care consideration BiPAP/CPAP n/a CPM n/a Continuous Drip IV n/a Dialysis ESRD on hemodialysis Tues, Thurs, Sat at [facility]. Family provides transport Armed forces training and education officer n/a Oxygen n/a Special Bed n/a Trach Size n/a Wound Vac n/a Skin tear to medial nose; left arm with skin tear; moisture associated skin damage to buttocks; left arm skin tear; HD access  LUE Bowel mgmt: cdiff diarrhea with flexiseal removed 04/14/18; pt with IBS Bladder mgmt: external catheter Diabetic mgmt yes Enteric precautions due to c diff diagnosis  HOH Made DNR by nephrology for she was DNR at  outpatient dialysis   Previous Home Environment Living Arrangements: Spouse/significant other  Lives With: Spouse Available Help at Discharge: Family, Available 24 hours/day Type of Home: House Home Layout: Two level, Able to live on main level with bedroom/bathroom Alternate Level Stairs-Rails: Right Alternate Level Stairs-Number of Steps: 10 steps to upper level Home Access: Level entry Bathroom Shower/Tub: Multimedia programmer: Handicapped height Bathroom Accessibility: Yes Home Care Services: No Additional Comments: Daughter lives in house behind pt  Discharge Living Setting Plans for Discharge Living Setting: Patient's home, Lives with (comment)(spouse) Type of Home at Discharge: House Discharge Home Layout: Two level, Able to live on main level with bedroom/bathroom, Laundry or work area in basement Alternate Level Stairs-Rails: Right Alternate Level Stairs-Number of Steps: 10 steps to upper level Discharge Home Access: Level entry Discharge Bathroom Shower/Tub: Walk-in shower Discharge Bathroom Toilet: Handicapped height Discharge Bathroom Accessibility: Yes How Accessible: Accessible via walker Does the patient have any problems obtaining your medications?: No  Social/Family/Support Systems Patient Roles: Spouse, Parent Contact Information: spouse , [name] and daughter, [name] Anticipated Caregiver: sposue, daughter and grand daughters Anticipated Caregiver's Contact Information: see above Ability/Limitations of Caregiver: spouse has a bad hip, daughter lives in home behind her parents Caregiver Availability: 24/7 Discharge Plan Discussed with Primary Caregiver: Yes Is Caregiver In Agreement with Plan?: Yes Does Caregiver/Family have Issues with Lodging/Transportation while Pt is in Rehab?: No  Goals/Additional Needs Patient/Family Goal for Rehab: Mod I to supervision with PT, OT, and SLP Expected length of stay: ELOS 7 to 10 days Special Service Needs:  New to hemodaioysis for 3 weeks. HD Tues,Thurs, and Sat Additional Information: spouse and daughter transport patient to hemodialysis at [place]  Pt/Family Agrees to Admission and willing to participate: Yes Program Orientation Provided & Reviewed with Pt/Caregiver Including Roles  & Responsibilities: Yes  Decrease burden of Care through IP rehab admission: n/a  Possible need for SNF placement upon discharge: not anticipated  Patient Condition: This patient's medical and functional status has changed since the consult dated: 04/09/2018 in which the Rehabilitation Physician determined and documented that the patient's condition is appropriate for intensive rehabilitative care in an inpatient rehabilitation facility. See "History of Present Illness" (above) for medical update. Functional changes are: overall mod asisst. Patient's medical and functional status update has been discussed with the Rehabilitation physician and patient remains appropriate for inpatient rehabilitation. Will admit to inpatient rehab today.  Preadmission Screen Completed By:  Cleatrice Burke, 04/15/2018 12:38 PM ______________________________________________________________________   Discussed status with Dr. Posey Pronto on 04/15/2018 at  1237 and received telephone approval for admission today.  Admission Coordinator:  Cleatrice Burke, time [id number] Date 04/15/2018       Cosigned by: Jamse Arn, MD at 04/15/2018 1:13 PM  Revision History

## 2018-04-15 NOTE — Progress Notes (Signed)
Debra Staggers, MD    Debra Staggers, MD  Physician  Physical Medicine and Rehabilitation      Consult Note  Signed     Date of Service:  04/09/2018  6:04 AM         Related encounter: ED to Hosp-Admission (Discharged) from 04/02/2018 in [place] All Collapse All            Expand widget buttonCollapse widget button    Show:Clear all   ManualTemplateCopied  Added by:     Angiulli, Lavon Paganini, PA-C  Debra Staggers, MD   Hover for detailscustomization button                                                                                                                                                                                untitled image              Physical Medicine and Rehabilitation Consult  Reason for Consult: Decreased functional mobility  Referring Physician: [facility]        HPI: [name] is a 77 y.o. right-handed female with history of end-stage renal disease, atrial fibrillation, CAD with CABG 2013 maintained on aspirin, left carotid enterectomy, hypertension, diabetes mellitus.  Per chart review patient lives with spouse.  Independent with assistive device prior to admission.  Daughter lives next door.  Two-level home with bed and bath on main level.  Presented 04/02/2018 after witnessed out of hospital cardiac arrest.  CPR was initiated.  EMS arrived within 10 minutes found patient in V. fib and was intubated in the field.  Noted systolic blood pressure in the 220s.  Troponin 1.17, lactic acid 5.11, WBC 18,900.  Placed on heparin drip.  Echocardiogram with ejection fraction of 50%.  Akinesis of the basal mid inferior lateral and inferior  myocardium grade 1 diastolic dysfunction.  Placed on broad-spectrum antibiotics for elevated WBC question aspiration pneumonia.  Cardiology follow-up plan for cardiac catheterization.  Hemodialysis ongoing for ESRD.  Hospital course positive C. difficile with Flexi-Seal in place and contact precautions initiated.  Patient remained intubated until 04/04/2018.  Therapy evaluations completed with recommendations of physical medicine rehab consult.        Review of Systems   Constitutional: Negative for chills and fever.   HENT: Positive for hearing loss.    Eyes: Negative for blurred vision and double vision.   Respiratory: Positive for shortness of breath.  Negative for cough.    Cardiovascular: Positive for chest pain, palpitations and leg swelling.   Gastrointestinal: Positive for diarrhea. Negative for nausea and vomiting.        GERD   Genitourinary: Negative for dysuria, flank pain and hematuria.   Musculoskeletal: Positive for myalgias.   Skin: Negative for rash.   Neurological: Positive for weakness.   Psychiatric/Behavioral: Positive for depression.   All other systems reviewed and are negative.          Past Medical History:    Diagnosis   Date    .   Anemia        .   Arthritis        .   Atrial fibrillation ([place])        .   Blood transfusion        .   CAD (coronary artery disease)        .   Cancer ([place])            .  top of head- melonoma    .   Carotid artery occlusion            Carotid Endartectom,y - left 2009.  Blockage Right being watched by Dr Scot Dock.    .   Carotid stenosis        .   Chronic kidney disease            patient states stage IV    .   Complication of anesthesia            pt. states that she was difficult to wake    .   Depression        .   Diabetes mellitus without complication ([place])        .   Dysrhythmia        .   General  weakness   12/2015    .   GERD (gastroesophageal reflux disease)        .   Glaucoma        .   History of hiatal hernia        .   History of kidney stones            passed    .   History of pneumonia        .   HOH (hard of hearing)        .   Hypertension        .   Hypokalemia   12/2015    .   Myocardial infarction ([place])        .   Neuromuscular disorder ([place])            CARPEL TUNNEL    .   Pneumonia        .   Shortness of breath        .   Stroke ([place])            hx of TIA             Past Surgical History:    Procedure   Laterality   Date    .   A/V FISTULAGRAM   N/A   07/21/2017        Procedure: A/V FISTULAGRAM - Left Arm;  Surgeon: Angelia Mould, MD;  Location: [facility];  Service: Cardiovascular;  Laterality: N/A;    .   AV FISTULA PLACEMENT   Left  05/31/2014        Procedure: Creation of Left Arm arteriovenous brachiocephalic Fistula;  Surgeon: Angelia Mould, MD;  Location: [facility];  Service: Vascular;  Laterality: Left;    .   AV FISTULA PLACEMENT   Left   09/01/2014        Procedure: INSERTION OF ARTERIOVENOUS (AV) GORE-TEX GRAFT LEFT UPPER ARM USING  4-7 MM X 45 CM SRTETCH GORETEX GRAFT;  Surgeon: Angelia Mould, MD;  Location: [facility];  Service: Vascular;  Laterality: Left;    .   CARDIAC CATHETERIZATION            .   CAROTID ENDARTERECTOMY   Left   2009         CEA    .   carpel tunnel            .   COLONOSCOPY       07/25/2011        Procedure: COLONOSCOPY;  Surgeon: Winfield Cunas., MD;  Location[facility] ENDOS[facility];  Service: Endoscopy;  Laterality: N/A;    .   CORONARY ARTERY BYPASS GRAFT       07/29/2011        Procedure: CORONARY ARTERY BYPASS GRAFTING (CABG);  Surgeon: Gaye Pollack, MD;  Location: [facility];  Service: Open Heart Surgery;   Laterality: N/A;    .   ENDARTERECTOMY   Right   04/21/2015        Procedure: ENDARTERECTOMY CAROTID;  Surgeon: Angelia Mould, MD;  Location: [place];  Service: Vascular;  Laterality: Right;    .   ESOPHAGOGASTRODUODENOSCOPY       07/25/2011        Procedure: ESOPHAGOGASTRODUODENOSCOPY (EGD);  Surgeon: Winfield Cunas., MD;  Location[facility] ENDOSCOPY;  Service: Endoscopy;  Laterality: N/A;    .   EYE SURGERY            .   FISTULOGRAM   Left   08/29/2014        Procedure: FISTULOGRAM;  Surgeon: Angelia Mould, MD;  Location: [facility] CATH LAB;  Service: Cardiovascular;  Laterality: Left;    .   KNEE SURGERY   Left        .   LUMBAR LAMINECTOMY/DECOMPRESSION MICRODISCECTOMY   N/A   12/01/2015        Procedure: L5-S1 Decompression and Bilateral Microdiscectomy;  Surgeon: Marybelle Killings, MD;  Location: [place];  Service: Orthopedics;  Laterality: N/A;    .   NECK SURGERY            .   PERIPHERAL VASCULAR BALLOON ANGIOPLASTY       07/21/2017        Procedure: PERIPHERAL VASCULAR BALLOON ANGIOPLASTY;  Surgeon: Angelia Mould, MD;  Location: [place] [facility];  Service: Cardiovascular;;  LT Arm AVF    .   TONSILLECTOMY                     Family History    Problem   Relation   Age of Onset    .   Diabetes   Mother        .   Hypertension   Mother        .   Heart disease   Mother                beofre age 81    .   Heart attack  Mother        .   Stroke   Mother        .   Cancer   Father        .   Hyperlipidemia   Father        .   Hypertension   Father        .   Deep vein thrombosis   Daughter        .   Diabetes   Daughter        .   Hyperlipidemia   Daughter        .   Hypertension   Daughter        .   Heart disease   Daughter        .   Peripheral vascular disease    Daughter        .   Breast cancer   Neg Hx           Social History:  reports that she has never smoked. She has never used smokeless tobacco. She reports that she does not drink alcohol or use drugs.  Allergies:         Allergies    Allergen   Reactions    .   Gabapentin   Other (See Comments)            Hallucinations and "Makes me go crazy"       .   Sulfa Antibiotics   Other (See Comments)            Altered mental state     .   Amlodipine   Other (See Comments)            "Makes my legs swell"; edema              Medications Prior to Admission    Medication   Sig   Dispense   Refill    .   acetaminophen (TYLENOL) 325 MG tablet   Take 2 tablets (650 mg total) by mouth every 6 (six) hours as needed for mild pain.            Marland Kitchen   allopurinol (ZYLOPRIM) 100 MG tablet   Take 200 mg by mouth daily.             Marland Kitchen   ALPRAZolam (XANAX) 0.25 MG tablet   Take 0.125 mg by mouth at bedtime.        0    .   aspirin EC 81 MG tablet   Take 1 tablet (81 mg total) by mouth daily.   90 tablet   3    .   atorvastatin (LIPITOR) 10 MG tablet   TAKE ONE TABLET BY MOUTH ONE TIME DAILY   30 tablet   0    .   b complex-C-folic acid 1 MG capsule   Take 1 capsule by mouth daily.            .   brimonidine (ALPHAGAN P) 0.1 % SOLN   Place 1 drop into the left eye 2 (two) times daily.            .   Cholecalciferol (VITAMIN D) 2000 UNITS tablet   Take 2,000 Units by mouth 2 (two) times daily.            .   citalopram (CELEXA) 20 MG tablet   Take 20 mg by mouth daily.            Marland Kitchen  colchicine 0.6 MG tablet   Take 0.6 mg by mouth 2 (two) times daily as needed (for gout flares).             .   Cyanocobalamin (B-12) 5000 MCG CAPS   Take 5,000 mcg by mouth daily.            .   insulin glargine (LANTUS) 100 UNIT/ML injection    Inject 0.2-0.3 mLs (20-30 Units total) into the skin at bedtime. (Patient taking differently: Inject 10-30 Units into the skin See admin instructions. Inject 10-30 units into the skin at bedtime, per sliding scale, and may inject an additional 10 units in the morning as needed for high blood sugar)   10 mL   11    .   Menthol-Methyl Salicylate (MUSCLE RUB) 10-15 % CREA   Apply 1 application topically 2 (two) times daily. (Patient taking differently: Apply 1 application topically as needed for muscle pain. )       0    .   metoprolol tartrate (LOPRESSOR) 25 MG tablet   Take 1 tablet (25 mg total) by mouth 2 (two) times daily. (Patient taking differently: Take 25 mg by mouth every morning. )   180 tablet   2    .   nitroGLYCERIN (NITROSTAT) 0.4 MG SL tablet   Place 0.4 mg under the tongue every 5 (five) minutes as needed for chest pain.             .   Omega-3 Fatty Acids (FISH OIL) 1200 MG CAPS   Take 1,200 mg by mouth 2 (two) times daily.            Marland Kitchen   omeprazole (PRILOSEC) 20 MG capsule   Take 20 mg by mouth daily as needed (acid reflux).             Marland Kitchen   PROCTOSOL HC 2.5 % rectal cream   Place 1 application rectally 2 (two) times daily as needed for hemorrhoids.        1    .   sitaGLIPtin (JANUVIA) 25 MG tablet   Take 25 mg by mouth daily.             .   timolol (TIMOPTIC) 0.5 % ophthalmic solution   Place 1 drop into the left eye daily.            .   travoprost, benzalkonium, (TRAVATAN) 0.004 % ophthalmic solution   Place 1 drop into both eyes at bedtime.             .   isosorbide mononitrate (IMDUR) 30 MG 24 hr tablet   Take 1 tablet (30 mg total) by mouth daily. (Patient not taking: Reported on 04/02/2018)   30 tablet   0    .   prazosin (MINIPRESS) 1 MG capsule   TAKE 1 CAPSULE (1 MG TOTAL) BY MOUTH AT BEDTIME. (Patient not taking: Reported on 04/02/2018)   30 capsule   1           Home:  [place] expects to be discharged to:: Private residence  Living Arrangements: Spouse/significant other  Available Help at Discharge: Family, Available 24 hours/day  Type of Home: House  Home Access: Level entry  [place]: Two level, Able to live on main level with bedroom/bathroom  Bathroom Shower/Tub: Tourist information centre manager: Handicapped height  Bathroom Accessibility: Yes  Home Equipment: Environmental consultant - 2 wheels, [place] - single  point, Bedside commode, Shower seat, Grab bars - toilet  Additional Comments: Daughter lives in house behind pt   Functional History:  Prior Function  Level of Independence: Independent with assistive device(s)  Comments: Uses rolling walker or cane. Frequent falls. Shares the housework with her husband.  Functional Status:   Mobility:  Bed Mobility  Overal bed mobility: Needs Assistance  Bed Mobility: Rolling, Sidelying to Sit  Rolling: Min assist  Sidelying to sit: Min assist  Supine to sit: Min assist, +2 for safety/equipment, HOB elevated  General bed mobility comments: Assist to move legs off of bed, elevate trunk into sitting, and bring hips to EOB  Transfers  Overall transfer level: Needs assistance  Equipment used: Rolling walker (2 wheeled)  Transfers: Sit to/from Stand  Sit to Stand: +2 physical assistance, Min assist  Stand pivot transfers: +2 physical assistance, Min assist  General transfer comment: Assist to bring hips up and for balance. Verbal cues for hand placement  Ambulation/Gait  Ambulation/Gait assistance: Min assist  Gait Distance (Feet): 10 Feet(10' x 1, 5' x1)  Assistive device: Rolling walker (2 wheeled)  Gait Pattern/deviations: Step-through pattern, Decreased step length - right, Decreased step length - left, Trunk flexed  General Gait Details: Assist for balance and support.   Gait velocity: decr  Gait velocity interpretation: <1.8 ft/sec,  indicate of risk for recurrent falls       ADL:  ADL  Overall ADL's : Needs assistance/impaired  Eating/Feeding: Set up, Supervision/ safety, Sitting  Grooming: Oral care, Brushing hair, Supervision/safety, Set up, Sitting  Grooming Details (indicate cue type and reason): Sitting in recliner, pt performing oral care and brushed her hair. Supervision and Min VCs for encouragement  Upper Body Bathing: Minimal assistance, Sitting  Lower Body Bathing: Maximal assistance, Sit to/from stand  Lower Body Bathing Details (indicate cue type and reason): Decreased ROM to bend forward due to chest pain  Upper Body Dressing : Minimal assistance, Sitting  Lower Body Dressing: Maximal assistance, Sit to/from stand  Toilet Transfer: Minimal assistance, +2 for physical assistance, Ambulation, RW(Simulated to recliner)  Toilet Transfer Details (indicate cue type and reason): Pt requiring Min A to power up into standing.   Functional mobility during ADLs: Minimal assistance, +2 for physical assistance, Rolling walker  General ADL Comments: Pt presenting with decreased balance, strength, ROM, and activity tolerance.     Cognition:  Cognition  Overall Cognitive Status: Impaired/Different from baseline  Orientation Level: Oriented to person, Oriented to place, Disoriented to time(forgetful of situation)  Cognition  Arousal/Alertness: Awake/alert  Behavior During Therapy: WFL for tasks assessed/performed  Overall Cognitive Status: Impaired/Different from baseline  Area of Impairment: Memory  Memory: Decreased short-term memory  Awareness: Emergent  General Comments: Pt requiring increased time and cues during session. Pt presenting with decreased processing and memory     Blood pressure (!) 179/61, pulse 78, temperature 97.8 F (36.6 C), temperature source Oral, resp. rate (!) 23, height 5\' 4"  (1.626 m), weight 59.1 kg, SpO2 95 %.  Physical Exam   Constitutional: No  distress.  Frail appearing   HENT:   Head: Normocephalic and atraumatic.   Neck: Normal range of motion.   Cardiovascular: Normal rate.   Respiratory: Effort normal. No respiratory distress.   GI: Soft.  Genitourinary:  Genitourinary Comments: Flexi-Seal in place  Musculoskeletal: She exhibits no edema.  Moves arms and legs freely without pain  Neurological: She is alert.  Resting comfortably.  Alert and oriented.  Follows simple commands. Decreased attention.  Fair insight. UE 4/5 prox to distal. LE: 3/5 HF, KE and 4/5 ADF/PF. Senses pain in all 4.   Skin: Skin is warm.  Psychiatric: She has a normal mood and affect. Her behavior is normal.         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 Yes   2.Co-Morbidities requiring supervision/potential complications: ESRD on HD, C diff, afib,    3.Due to bladder management, bowel management, safety, skin/wound care, disease management, medication administration and patient education, does the patient require 24 hr/day rehab nursing? Yes   4.Does the patient require coordinated care of a physician, rehab nurse, PT (1-2 hrs/day, 5 days/week), OT (1-2 hrs/day, 5 days/week) and SLP (1-2 hrs/day, 5 days/week) to address physical and functional deficits in the context of the above medical diagnosis(es)? Yes Addressing deficits in the following areas: balance, endurance, locomotion, strength, transferring, bowel/bladder control, bathing, dressing, feeding, grooming, toileting, cognition and psychosocial support, stamina.   5.Can the patient actively participate in an intensive therapy program of at least 3 hrs of therapy per day at least 5 days per week? Yes   6.The potential for patient to make measurable gains while on inpatient rehab is excellent   7.Anticipated functional outcomes upon discharge from inpatient rehab are modified independent and supervision  with PT, modified independent and supervision with OT, modified independent with SLP.   8.Estimated rehab length of stay to reach the above functional goals is: 7-10 days   9.Anticipated D/C setting: Home   10.Anticipated post D/C treatments: [place] therapy   11.Overall Rehab/Functional Prognosis: excellent      RECOMMENDATIONS:  This patient's condition is appropriate for continued rehabilitative care in the following setting: CIR  Patient has agreed to participate in recommended program. Yes  Note that insurance prior authorization may be required for reimbursement for recommended care.      Comment: Rehab Admissions Coordinator to follow up.     Thanks,     Debra Staggers, MD, [facility]rown     I have personally performed a face to face diagnostic evaluation of this patient. Additionally, I have reviewed and concur with the physician assistant's documentation above.       Lavon Paganini Angiulli, PA-C  04/09/2018                Revision History                                        Routing History

## 2018-04-15 NOTE — Progress Notes (Signed)
Physical Therapy Treatment Patient Details Name: [name] MRN: [id number] DOB: [birth year] Today's Date: 04/15/2018    History of Present Illness Pt adm with vfib arrest with out of hospital CPR x 12 minutes. Intubated 9/19-9/21. Pt also +Cdiff. PMH - ESRD with recent initiation of HD, cabg, ckd, dm, mi, lt knee surgery, neck surgery, back surgery, afib, CEA, CVA, multiple falls.     PT Comments    Patient limited this session by fatigue, sitting EOB with increased independence. Focused on sit to stand transfers x3 this visit, side stepping up to head of bed. Patient plants to admit to CIR today, agree with this plan and feel she will benefit greatly. SpO2 WNL on RA, HR in 60's during visit.     Follow Up Recommendations  CIR;Supervision/Assistance - 24 hour     Equipment Recommendations  None recommended by PT    Recommendations for Other Services       Precautions / Restrictions Precautions Precautions: Fall Restrictions Weight Bearing Restrictions: No    Mobility  Bed Mobility Overal bed mobility: Needs Assistance Bed Mobility: Rolling;Sidelying to Sit   Sidelying to sit: Min assist Supine to sit: Min assist     General bed mobility comments: pt coming to sitting EOB with contact guard min A at most.   Transfers Overall transfer level: Needs assistance Equipment used: Rolling walker (2 wheeled) Transfers: Sit to/from Stand Sit to Stand: Min assist         General transfer comment: min a to stand into RW unable to power herself up without assistance.  Ambulation/Gait                 Stairs             Wheelchair Mobility    Modified Rankin (Stroke Patients Only)       Balance Overall balance assessment: Needs assistance   Sitting balance-Leahy Scale: Fair     Standing balance support: Bilateral upper extremity supported Standing balance-Leahy Scale: Poor                              Cognition Arousal/Alertness:  Awake/alert Behavior During Therapy: WFL for tasks assessed/performed                                   General Comments: following commands, reports fatigue       Exercises General Exercises - Lower Extremity Long Arc Quad: AROM;Both;5 reps;Seated Hip ABduction/ADduction: AROM;10 reps;Both;Seated    General Comments        Pertinent Vitals/Pain Pain Assessment: Faces Faces Pain Scale: Hurts a little bit Pain Location: chest Pain Descriptors / Indicators: Grimacing;Guarding Pain Intervention(s): Limited activity within patient's tolerance;Monitored during session;Premedicated before session    Home Living                      Prior Function            PT Goals (current goals can now be found in the care plan section) Acute Rehab PT Goals PT Goal Formulation: With patient Time For Goal Achievement: 04/21/18 Potential to Achieve Goals: Fair Progress towards PT goals: Progressing toward goals    Frequency    Min 3X/week      PT Plan Current plan remains appropriate    Co-evaluation  AM-PAC PT "6 Clicks" Daily Activity  Outcome Measure  Difficulty turning over in bed (including adjusting bedclothes, sheets and blankets)?: Unable Difficulty moving from lying on back to sitting on the side of the bed? : Unable Difficulty sitting down on and standing up from a chair with arms (e.g., wheelchair, bedside commode, etc,.)?: Unable Help needed moving to and from a bed to chair (including a wheelchair)?: A Little Help needed walking in hospital room?: A Little Help needed climbing 3-5 steps with a railing? : A Lot 6 Click Score: 11    End of Session Equipment Utilized During Treatment: Gait belt Activity Tolerance: Patient limited by fatigue;Patient limited by pain Patient left: in bed;with call bell/phone within reach Nurse Communication: Mobility status PT Visit Diagnosis: Unsteadiness on feet (R26.81);Other abnormalities  of gait and mobility (R26.89);Repeated falls (R29.6);Muscle weakness (generalized) (M62.81)     Time: 0950-1006 PT Time Calculation (min) (ACUTE ONLY): 16 min  Charges:  $Therapeutic Activity: 8-22 mins                    Reinaldo Berber, PT, DPT Acute Rehabilitation Services Pager: [phone] Office: [phone]     Reinaldo Berber 04/15/2018, 1:06 PM

## 2018-04-15 NOTE — H&P (Signed)
  Physical Medicine and Rehabilitation Admission H&P    Chief Complaint  Patient presents with  . Cardiac Arrest  : HPI: [name] is a 77-year-old right-handed female history of irritable bowel syndrome, end-stage renal disease with hemodialysis recently initiated approximately 3 to 4 weeks ago, atrial fibrillation, CAD with CABG 2013 maintained on aspirin, left carotid enterectomy, hypertension, diabetes mellitus as well as history of lumbar radiculopathy with L5-S1 microdiscectomy receiving inpatient rehab services May 2017.  Per chart review and family, lives with spouse.  Independent with assistive device prior to admission.  Daughter lives next door.  2 level home with bed and bath on main level.  Presented 04/02/2018 after witnessed out of hospital cardiac arrest.  CPR initiated.  EMS arrived within 10 minutes found in V. fib and intubated in the field.  Noted systolic blood pressure in the 220s.  Troponin 1.17, lactic acid 5.11, WBC 18,900.  Placed on heparin drip.  Echocardiogram with ejection fraction of 50% akinesis of the basal mid inferior lateral and inferior myocardium grade 1 diastolic dysfunction.  Placed on broad-spectrum antibiotics for elevated WBC question aspiration pneumonia.  Cardiology follow-up cardiac catheterization 04/08/2018 that showed proximal LCx lesion 100% stenosis in proximal LAD 95% stenosis.  Patient underwent successful stenting per Dr.Varanasi and maintained on aspirin and Brilinta therapy.  Hemodialysis ongoing as per renal services.  Hospital course positive C. difficile with Flexi-Seal placed contact precautions completing course of vancomycin for C. difficile.  Subcutaneous heparin for DVT prophylaxis.  Patient did remain intubated through 04/04/2018.  Patient with episode of bradycardia 04/10/2018 and remained asymptomatic with follow-up per cardiology services with EKG showing P-P prolongation noted while coughing and also episode while sleeping.  Beta-blocker was discontinued.   Therapy evaluations completed with recommendations of physical medicine rehab consult.  Patient was admitted for a comprehensive rehab program.  Review of Systems  Constitutional: Positive for malaise/fatigue.  HENT: Positive for hearing loss.   Cardiovascular: Positive for leg swelling.  Gastrointestinal: Positive for diarrhea.       GERD  Musculoskeletal: Positive for myalgias.  Neurological: Positive for weakness.  Psychiatric/Behavioral: Positive for depression.  All other systems reviewed and are negative.  Past Medical History:  Diagnosis Date  . Anemia   . Arthritis   . Atrial fibrillation (HCC)   . Blood transfusion   . CAD (coronary artery disease)   . Cancer (HCC)    .  top of head- melonoma  . Carotid artery occlusion    Carotid Endartectom,y - left 2009.  Blockage Right being watched by Dr Dickson.  . Carotid stenosis   . 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 Complication of anesthesia    pt. states that she was difficult to wake  . Depression   . Diabetes mellitus without complication (HCC)   . Dysrhythmia   . General weakness 12/2015  . GERD (gastroesophageal reflux disease)   . Glaucoma   . History of hiatal hernia   . History of kidney stones    passed  . History of pneumonia   . HOH (hard of hearing)   . Hypertension   . Hypokalemia 12/2015  . Myocardial infarction (HCC)   . Neuromuscular disorder (HCC)    CARPEL TUNNEL  . Pneumonia   . Shortness of breath   . Stroke (HCC)    hx of TIA   Past Surgical History:  Procedure Laterality Date  . A/V FISTULAGRAM N/A 07/21/2017   Procedure: A/V   FISTULAGRAM - Left Arm;  Surgeon: Dickson, Christopher S, MD;  Location: [facility];  Service: Cardiovascular;  Laterality: N/A;  . AV FISTULA PLACEMENT Left 05/31/2014   Procedure: Creation of Left Arm arteriovenous brachiocephalic Fistula;  Surgeon: Christopher S Dickson, MD;  Location: [facility];  Service:  Vascular;  Laterality: Left;  . AV FISTULA PLACEMENT Left 09/01/2014   Procedure: INSERTION OF ARTERIOVENOUS (AV) GORE-TEX GRAFT LEFT UPPER ARM USING  4-7 MM X 45 CM SRTETCH GORETEX GRAFT;  Surgeon: Christopher S Dickson, MD;  Location: [facility];  Service: Vascular;  Laterality: Left;  . CARDIAC CATHETERIZATION    . CAROTID ENDARTERECTOMY Left 2009    CEA  . carpel tunnel    . COLONOSCOPY  07/25/2011   Procedure: COLONOSCOPY;  Surgeon: James L Edwards Jr., MD;  Location: [facility];  Service: Endoscopy;  Laterality: N/A;  . CORONARY ARTERY BYPASS GRAFT  07/29/2011   Procedure: CORONARY ARTERY BYPASS GRAFTING (CABG);  Surgeon: Bryan K Bartle, MD;  Location: [facility];  Service: Open Heart Surgery;  Laterality: N/A;  . CORONARY STENT INTERVENTION N/A 04/08/2018   Procedure: CORONARY STENT INTERVENTION;  Surgeon: Varanasi, Jayadeep S, MD;  [facility];  Service: Cardiovascular;  Laterality: N/A;  . ENDARTERECTOMY Right 04/21/2015   Procedure: ENDARTERECTOMY CAROTID;  Surgeon: Christopher S Dickson, MD;  Location: [facility];  Service: Vascular;  Laterality: Right;  . ESOPHAGOGASTRODUODENOSCOPY  07/25/2011   Procedure: ESOPHAGOGASTRODUODENOSCOPY (EGD);  Surgeon: James L Edwards Jr., MD;  [facility]: [facility];  Service: Endoscopy;  Laterality: N/A;  . EYE SURGERY    . FISTULOGRAM Left 08/29/2014   Procedure: FISTULOGRAM;  Surgeon: Christopher S Dickson, MD;  [facility]: [facility];  Service: Cardiovascular;  Laterality: Left;  . KNEE SURGERY Left   . LEFT HEART CATH AND CORS/GRAFTS ANGIOGRAPHY N/A 04/08/2018   Procedure: LEFT HEART CATH AND CORS/GRAFTS ANGIOGRAPHY;  Surgeon: Varanasi, Jayadeep S, MD;  [facility];  Service: Cardiovascular;  Laterality: N/A;  . LUMBAR LAMINECTOMY/DECOMPRESSION MICRODISCECTOMY N/A 12/01/2015   Procedure: L5-S1 Decompression and Bilateral Microdiscectomy;  Surgeon: Mark C Yates, MD;  Location: [facility];  Service: Orthopedics;  Laterality: N/A;  . NECK  SURGERY    . PERIPHERAL VASCULAR BALLOON ANGIOPLASTY  07/21/2017   Procedure: PERIPHERAL VASCULAR BALLOON ANGIOPLASTY;  Surgeon: Dickson, Christopher S, MD;  [facility];  Service: Cardiovascular;;  LT Arm AVF  . TONSILLECTOMY     Family History  Problem Relation Age of Onset  . Diabetes Mother   . Hypertension Mother   . Heart disease Mother        beofre age 60  . Heart attack Mother   . Stroke Mother   . Cancer Father   . Hyperlipidemia Father   . Hypertension Father   . Deep vein thrombosis Daughter   . Diabetes Daughter   . Hyperlipidemia Daughter   . Hypertension Daughter   . Heart disease Daughter   . Peripheral vascular disease Daughter   . Breast cancer Neg Hx    Social History:  reports that she has never smoked. She has never used smokeless tobacco. She reports that she does not drink alcohol or use drugs. Allergies:  Allergies  Allergen Reactions  . Gabapentin Other (See Comments)    Hallucinations and "Makes me go crazy"   . Sulfa Antibiotics Other (See Comments)    Altered mental state   . Amlodipine Other (See Comments)    "Makes my legs swell"; edema     Medications Prior to Admission  Medication Sig Dispense Refill  . acetaminophen (TYLENOL) 325 MG tablet Take 2 tablets (650 mg total) by mouth every 6 (six) hours as needed for mild pain.    . allopurinol (ZYLOPRIM) 100 MG tablet Take 200 mg by mouth daily.     . ALPRAZolam (XANAX) 0.25 MG tablet Take 0.125 mg by mouth at bedtime.   0  . aspirin EC 81 MG tablet Take 1 tablet (81 mg total) by mouth daily. 90 tablet 3  . atorvastatin (LIPITOR) 10 MG tablet TAKE ONE TABLET BY MOUTH ONE TIME DAILY 30 tablet 0  . b complex-C-folic acid 1 MG capsule Take 1 capsule by mouth daily.    . brimonidine (ALPHAGAN P) 0.1 % SOLN Place 1 drop into the left eye 2 (two) times daily.    . Cholecalciferol (VITAMIN D) 2000 UNITS tablet Take 2,000 Units by mouth 2 (two) times daily.    . citalopram (CELEXA) 20 MG  tablet Take 20 mg by mouth daily.    . colchicine 0.6 MG tablet Take 0.6 mg by mouth 2 (two) times daily as needed (for gout flares).     . Cyanocobalamin (B-12) 5000 MCG CAPS Take 5,000 mcg by mouth daily.    . insulin glargine (LANTUS) 100 UNIT/ML injection Inject 0.2-0.3 mLs (20-30 Units total) into the skin at bedtime. (Patient taking differently: Inject 10-30 Units into the skin See admin instructions. Inject 10-30 units into the skin at bedtime, per sliding scale, and may inject an additional 10 units in the morning as needed for high blood sugar) 10 mL 11  . isosorbide mononitrate (IMDUR) 30 MG 24 hr tablet Take 1 tablet (30 mg total) by mouth daily. (Patient not taking: Reported on 04/02/2018) 30 tablet 0  . Menthol-Methyl Salicylate (MUSCLE RUB) 10-15 % CREA Apply 1 application topically 2 (two) times daily. (Patient taking differently: Apply 1 application topically as needed for muscle pain. )  0  . metoprolol tartrate (LOPRESSOR) 25 MG tablet Take 1 tablet (25 mg total) by mouth 2 (two) times daily. (Patient taking differently: Take 25 mg by mouth every morning. ) 180 tablet 2  . nitroGLYCERIN (NITROSTAT) 0.4 MG SL tablet Place 0.4 mg under the tongue every 5 (five) minutes as needed for chest pain.     . Omega-3 Fatty Acids (FISH OIL) 1200 MG CAPS Take 1,200 mg by mouth 2 (two) times daily.    . omeprazole (PRILOSEC) 20 MG capsule Take 20 mg by mouth daily as needed (acid reflux).     . prazosin (MINIPRESS) 1 MG capsule TAKE 1 CAPSULE (1 MG TOTAL) BY MOUTH AT BEDTIME. (Patient not taking: Reported on 04/02/2018) 30 capsule 1  . PROCTOSOL HC 2.5 % rectal cream Place 1 application rectally 2 (two) times daily as needed for hemorrhoids.   1  . sitaGLIPtin (JANUVIA) 25 MG tablet Take 25 mg by mouth daily.     . timolol (TIMOPTIC) 0.5 % ophthalmic solution Place 1 drop into the left eye daily.    . travoprost, benzalkonium, (TRAVATAN) 0.004 % ophthalmic solution Place 1 drop into both eyes at  bedtime.       Drug Regimen Review Drug regimen was reviewed and remains appropriate with no significant issues identified  Home: Home Living Family/patient expects to be discharged to:: Private residence Living Arrangements: Spouse/significant other Available Help at Discharge: Family, Available 24 hours/day Type of Home: House Home Access: Level entry Home Layout: Two level, Able to live on main   level with bedroom/bathroom Alternate Level Stairs-Number of Steps: 10 steps to upper level Alternate Level Stairs-Rails: Right Bathroom Shower/Tub: Multimedia programmer: Handicapped height Bathroom Accessibility: Yes Home Equipment: Environmental consultant - 2 wheels, Cane - single point, Bedside commode, Shower seat, Grab bars - toilet Additional Comments: Daughter lives in house behind pt  Lives With: Spouse   Functional History: Prior Function Level of Independence: (Mod I with cane and RW as needed) Comments: Uses rolling walker or cane. Frequent falls. Shares the housework with her husband.  Functional Status:  Mobility: Bed Mobility Overal bed mobility: Needs Assistance Bed Mobility: Supine to Sit, Sit to Supine Rolling: Modified independent (Device/Increase time) Sidelying to sit: Min assist Supine to sit: Min guard Sit to supine: Min assist General bed mobility comments: min guard to raise trunk, increased time, assist for LEs back in to bed Transfers Overall transfer level: Needs assistance Equipment used: Rolling walker (2 wheeled) Transfers: Sit to/from Stand Sit to Stand: Min assist Stand pivot transfers: Min guard General transfer comment: min assist to rise and steady, cues for hand placement Ambulation/Gait Ambulation/Gait assistance: Mod assist, +2 physical assistance Gait Distance (Feet): 2 Feet Assistive device: 2 person hand held assist Gait Pattern/deviations: Step-to pattern, Decreased stride length General Gait Details: side steps up to [facility] with fatigue and  weakness with bilateral HHA Gait velocity: decr Gait velocity interpretation: <1.8 ft/sec, indicate of risk for recurrent falls    ADL: ADL Overall ADL's : Needs assistance/impaired Eating/Feeding: Set up, Sitting Grooming: Wash/dry hands, Wash/dry face, Sitting, Supervision/safety, Set up Grooming Details (indicate cue type and reason): Sitting in recliner, pt performing oral care and brushed her hair. Supervision and Min VCs for encouragement Upper Body Bathing: Minimal assistance, Sitting Lower Body Bathing: Maximal assistance, Sit to/from stand Lower Body Bathing Details (indicate cue type and reason): Decreased ROM to bend forward due to chest pain Upper Body Dressing : Minimal assistance, Sitting Lower Body Dressing: Maximal assistance, Sit to/from stand Toilet Transfer: Minimal assistance, Ambulation, RW, BSC Toilet Transfer Details (indicate cue type and reason): Pt requiring Min A to power up into standing.  Toileting- Clothing Manipulation and Hygiene: Minimal assistance, Sit to/from stand Functional mobility during ADLs: Minimal assistance, Rolling walker General ADL Comments: pt with chief complaint of fatigue, but feels like she will get well with rehab  Cognition: Cognition Overall Cognitive Status: Within Functional Limits for tasks assessed Orientation Level: Oriented X4 Cognition Arousal/Alertness: Awake/alert Behavior During Therapy: WFL for tasks assessed/performed Overall Cognitive Status: Within Functional Limits for tasks assessed Area of Impairment: Memory, Problem solving Memory: Decreased short-term memory Awareness: Emergent Problem Solving: Slow processing, Difficulty sequencing, Requires verbal cues General Comments: reports fatigue, pt HOH  Physical Exam: Blood pressure (!) 122/47, pulse 61, temperature 98.1 F (36.7 C), temperature source Axillary, resp. rate 16, height 5' 4" (1.626 m), weight 72.8 kg, SpO2 96 %. Physical Exam  Vitals  reviewed. Constitutional: She appears well-developed and well-nourished.  Fatigued  HENT:  Head: Normocephalic and atraumatic.  Eyes: EOM are normal. Right eye exhibits no discharge. Left eye exhibits no discharge.  Neck: Normal range of motion. Neck supple. No thyromegaly present.  Cardiovascular: Normal rate and regular rhythm.  Respiratory: Effort normal and breath sounds normal.  GI: Soft. Bowel sounds are normal. She exhibits no distension.  Musculoskeletal:  Generalized edema  Neurological: She is alert.   Limited Medical historian A&Ox2 Motor: 4-/5 grossly thoughout  Skin: Skin is warm and dry.  Psychiatric: Her speech is delayed. She is slowed.  Results for orders placed or performed during the hospital encounter of 04/02/18 (from the past 48 hour(s))  Glucose, capillary     Status: Abnormal   Collection Time: 04/13/18  4:24 PM  Result Value Ref Range   Glucose-Capillary 202 (H) 70 - 99 mg/dL   Comment 1 Document in Chart   Glucose, capillary     Status: Abnormal   Collection Time: 04/13/18  9:25 PM  Result Value Ref Range   Glucose-Capillary 138 (H) 70 - 99 mg/dL   Comment 1 Notify RN   Magnesium     Status: None   Collection Time: 04/14/18  2:21 AM  Result Value Ref Range   Magnesium 2.0 1.7 - 2.4 mg/dL    Comment: Performed at [facility], [address] [place], [place] [address]  CBC     Status: Abnormal   Collection Time: 04/14/18  2:21 AM  Result Value Ref Range   WBC 13.1 (H) 4.0 - 10.5 K/uL   RBC 3.07 (L) 3.87 - 5.11 MIL/uL   Hemoglobin 10.3 (L) 12.0 - 15.0 g/dL   HCT 32.1 (L) 36.0 - 46.0 %   MCV 104.6 (H) 78.0 - 100.0 fL   MCH 33.6 26.0 - 34.0 pg   MCHC 32.1 30.0 - 36.0 g/dL   RDW 14.3 11.5 - 15.5 %   Platelets 230 150 - 400 K/uL    Comment: Performed at [facility], [address] [place], [place] [address]  Renal function panel     Status: Abnormal   Collection Time: 04/14/18  2:21 AM  Result Value Ref Range   Sodium 134 (L)  135 - 145 mmol/L   Potassium 4.1 3.5 - 5.1 mmol/L   Chloride 94 (L) 98 - 111 mmol/L   CO2 19 (L) 22 - 32 mmol/L   Glucose, Bld 138 (H) 70 - 99 mg/dL   BUN 47 (H) 8 - 23 mg/dL   Creatinine, Ser 5.88 (H) 0.44 - 1.00 mg/dL    Comment: DELTA CHECK NOTED   Calcium 9.0 8.9 - 10.3 mg/dL   Phosphorus 7.2 (H) 2.5 - 4.6 mg/dL   Albumin 2.5 (L) 3.5 - 5.0 g/dL   GFR calc non Af Amer 6 (L) >60 mL/min   GFR calc Af Amer 7 (L) >60 mL/min    Comment: (NOTE) The eGFR has been calculated using the CKD EPI equation. This calculation has not been validated in all clinical situations. eGFR's persistently <60 mL/min signify possible Chronic Kidney Disease.    Anion gap 21 (H) 5 - 15    Comment: Performed at [facility], [address] [place], [place] [address]  Glucose, capillary     Status: Abnormal   Collection Time: 04/14/18  1:45 PM  Result Value Ref Range   Glucose-Capillary 112 (H) 70 - 99 mg/dL  Glucose, capillary     Status: Abnormal   Collection Time: 04/14/18  5:25 PM  Result Value Ref Range   Glucose-Capillary 145 (H) 70 - 99 mg/dL  Glucose, capillary     Status: Abnormal   Collection Time: 04/14/18  9:52 PM  Result Value Ref Range   Glucose-Capillary 106 (H) 70 - 99 mg/dL   Comment 1 Notify RN    Comment 2 Document in Chart   CBC     Status: Abnormal   Collection Time: 04/15/18  2:43 AM  Result Value Ref Range   WBC 9.0 4.0 - 10.5 K/uL   RBC 2.77 (L)   3.87 - 5.11 MIL/uL   Hemoglobin 9.3 (L) 12.0 - 15.0 g/dL   HCT 29.5 (L) 36.0 - 46.0 %   MCV 106.5 (H) 78.0 - 100.0 fL   MCH 33.6 26.0 - 34.0 pg   MCHC 31.5 30.0 - 36.0 g/dL   RDW 14.4 11.5 - 15.5 %   Platelets 186 150 - 400 K/uL    Comment: Performed at [facility], [address] [place], [place] [address]  Magnesium     Status: None   Collection Time: 04/15/18  2:43 AM  Result Value Ref Range   Magnesium 2.0 1.7 - 2.4 mg/dL    Comment: Performed at [facility], [address] [place], [place] [address]   Basic metabolic panel     Status: Abnormal   Collection Time: 04/15/18  2:43 AM  Result Value Ref Range   Sodium 136 135 - 145 mmol/L   Potassium 3.3 (L) 3.5 - 5.1 mmol/L    Comment: DELTA CHECK NOTED   Chloride 95 (L) 98 - 111 mmol/L   CO2 27 22 - 32 mmol/L   Glucose, Bld 95 70 - 99 mg/dL   BUN 17 8 - 23 mg/dL   Creatinine, Ser 3.21 (H) 0.44 - 1.00 mg/dL    Comment: DELTA CHECK NOTED   Calcium 8.7 (L) 8.9 - 10.3 mg/dL   GFR calc non Af Amer 13 (L) >60 mL/min   GFR calc Af Amer 15 (L) >60 mL/min    Comment: (NOTE) The eGFR has been calculated using the CKD EPI equation. This calculation has not been validated in all clinical situations. eGFR's persistently <60 mL/min signify possible Chronic Kidney Disease.    Anion gap 14 5 - 15    Comment: Performed at [facility], [address] [place], [place] [address]  Glucose, capillary     Status: None   Collection Time: 04/15/18  8:13 AM  Result Value Ref Range   Glucose-Capillary 89 70 - 99 mg/dL   Comment 1 Notify RN    Comment 2 Document in Chart   Glucose, capillary     Status: Abnormal   Collection Time: 04/15/18 12:33 PM  Result Value Ref Range   Glucose-Capillary 125 (H) 70 - 99 mg/dL   No results found.     Medical Problem List and Plan: 1.  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  DVT Prophylaxis/Anticoagulation: Subcutaneous heparin.  Monitor for any bleeding episodes 3. Pain Management: Tylenol as needed 4. Mood: Provide emotional support.  Off of Celexa since hospitalization.  Avoiding QT prolonging agents 5. Neuropsych: This patient is capable of making decisions on her own behalf. 6. Skin/Wound Care: Routine skin checks 7. Fluids/Electrolytes/Nutrition: Routine in and outs with follow-up chemistries 8.  Hypertension.  Hydralazine 25 mg every 8 hours 9.  Nonsustained V. Tach/bradycardia.  Patient is asymptomatic.  Avoid QT prolonging agents.   Follow-up per cardiology services.  Patient is DNR 10.  C. difficile.  Flexi-Seal discontinued.  Contact precautions.  Vancomycin completed 11.  End-stage renal disease with hemodialysis.  Hemodialysis recently initiated 3 to 4 weeks ago.  Follow-up per renal services 12.  Diabetes mellitus with peripheral neuropathy.  Lantus insulin 15 units daily.  Check blood sugars before meals and at bedtime 13.  History of CAD with CABG 2013.  Continue aspirin 14.  Hyperlipidemia.  Lipitor 15.  Acute on chronic anemia.  Continue Aranesp   Post Admission   Physician Evaluation: 1. Preadmission assessment reviewed and changes made below. 2. Functional deficits secondary  to debility. 3. Patient is admitted to receive collaborative, interdisciplinary care between the physiatrist, rehab nursing staff, and therapy team. 4. Patient's level of medical complexity and substantial therapy needs in context of that medical necessity cannot be provided at a lesser intensity of care such as a SNF. 5. Patient has experienced substantial functional loss from his/her baseline which was documented above under the "Functional History" and "Functional Status" headings.  Judging by the patient's diagnosis, physical exam, and functional history, the patient has potential for functional progress which will result in measurable gains while on inpatient rehab.  These gains will be of substantial and practical use upon discharge  in facilitating mobility and self-care at the household level. 6. Physiatrist will provide 24 hour management of medical needs as well as oversight of the therapy plan/treatment and provide guidance as appropriate regarding the interaction of the two. 7. 24 hour rehab nursing will assist with safety, disease management and patient education  and help integrate therapy concepts, techniques,education, etc. 8. PT will assess and treat for/with: Lower extremity strength, range of motion, stamina, balance, functional  mobility, safety, adaptive techniques and equipment, wound care, coping skills, pain control, education. Goals are: Supervision. 9. OT will assess and treat for/with: ADL's, functional mobility, safety, upper extremity strength, adaptive techniques and equipment, wound mgt, ego support, and community reintegration.   Goals are: Supervsion. Therapy may proceed with showering this patient. 10. Case Management and Social Worker will assess and treat for psychological issues and discharge planning. 11. Team conference will be held weekly to assess progress toward goals and to determine barriers to discharge. 12. Patient will receive at least 3 hours of therapy per day at least 5 days per week. 13. ELOS: 10-14 days.       14. Prognosis:  good  I have personally performed a face to face diagnostic evaluation, including, but not limited to relevant history and physical exam findings, of this patient and developed relevant assessment and plan.  Additionally, I have reviewed and concur with the physician assistant's documentation above.  The patient's status has not changed. The original post admission physician evaluation remains appropriate, and any changes from the pre-admission screening or documentation from the acute chart are noted above.   Ankit Patel, MD, ABPMR Daniel J Angiulli, PA-C 04/15/2018  

## 2018-04-15 NOTE — Progress Notes (Signed)
Progress Note  Patient Name: [name] Date of Encounter: 04/15/2018  Primary Cardiologist: Larae Grooms, MD   Subjective   No chest pain no syncope Ok at dialysis yesterday   Inpatient Medications    Scheduled Meds: . aspirin  81 mg Oral Daily  . atorvastatin  40 mg Oral q1800  . B-complex with vitamin C  1 tablet Per Tube Daily  . brimonidine  1 drop Left Eye TID  . Chlorhexidine Gluconate Cloth  6 each Topical Q0600  . darbepoetin (ARANESP) injection - DIALYSIS  100 mcg Intravenous Q Thu-HD  . feeding supplement (NEPRO CARB STEADY)  237 mL Oral BID BM  . heparin  5,000 Units Subcutaneous Q8H  . hydrALAZINE  25 mg Oral Q8H  . insulin aspart  2-6 Units Subcutaneous TID WC  . insulin glargine  15 Units Subcutaneous Daily  . latanoprost  1 drop Both Eyes QHS  . mouth rinse  15 mL Mouth Rinse BID  . ticagrelor  90 mg Oral BID  . timolol  1 drop Left Eye Daily   Continuous Infusions: . ferric gluconate (FERRLECIT/NULECIT) IV Stopped (04/14/18 1209)   PRN Meds: acetaminophen, alum & mag hydroxide-simeth, bisacodyl, calcium carbonate, docusate, Gerhardt's butt cream, guaiFENesin-dextromethorphan, ondansetron (ZOFRAN) IV   Vital Signs    Vitals:   04/14/18 2000 04/14/18 2300 04/15/18 0505 04/15/18 0632  BP: (!) 124/43 (!) 97/41 (!) 134/39   Pulse: 64 64 68   Resp: 18 14 (!) 22   Temp: 98.5 F (36.9 C) 97.7 F (36.5 C) 98.1 F (36.7 C)   TempSrc: Oral Oral Oral   SpO2: 93% 96% 94%   Weight:    72.8 kg  Height:        Intake/Output Summary (Last 24 hours) at 04/15/2018 0805 Last data filed at 04/14/2018 2020 Gross per 24 hour  Intake 120 ml  Output 1500 ml  Net -1380 ml   Filed Weights   04/14/18 0752 04/14/18 1205 04/15/18 0632  Weight: 73.6 kg 72 kg 72.8 kg    Telemetry    SR PVCls a couple of sinus pauses 3-4 seconds   ECG    Prior EKG from 04/09/2018 shows right bundle branch block, sinus rhythm with QTC of 495- Personally  Reviewed  Physical Exam  Mild confusion  GEN: No acute distress.  Appears tired but smiling Neck: No JVD Cardiac: RRR SEM AV sclerosis  Respiratory: Clear to auscultation bilaterally. GI: Soft, nontender, non-distended  MS: No edema; No deformity. Neuro:  Nonfocal  Psych: Normal affect, mild confusion Fistula LUE with thrill   Labs    Chemistry Recent Labs  Lab 04/11/18 0844 04/12/18 0910 04/14/18 0221 04/15/18 0243  NA 135 135 134* 136  K 4.0 3.6 4.1 3.3*  CL 94* 96* 94* 95*  CO2 25 27 19* 27  GLUCOSE 115* 115* 138* 95  BUN 34* 22 47* 17  CREATININE 4.69* 3.37* 5.88* 3.21*  CALCIUM 8.9 8.9 9.0 8.7*  ALBUMIN 2.2*  --  2.5*  --   GFRNONAA 8* 12* 6* 13*  GFRAA 9* 14* 7* 15*  ANIONGAP 16* 12 21* 14     Hematology Recent Labs  Lab 04/12/18 0910 04/14/18 0221 04/15/18 0243  WBC 11.3* 13.1* 9.0  RBC 2.83* 3.07* 2.77*  HGB 9.4* 10.3* 9.3*  HCT 30.3* 32.1* 29.5*  MCV 107.1* 104.6* 106.5*  MCH 33.2 33.6 33.6  MCHC 31.0 32.1 31.5  RDW 14.4 14.3 14.4  PLT 186 230 186  Cardiac EnzymesNo results for input(s): TROPONINI in the last 168 hours. No results for input(s): TROPIPOC in the last 168 hours.   BNPNo results for input(s): BNP, PROBNP in the last 168 hours.   DDimer No results for input(s): DDIMER in the last 168 hours.   Radiology    No results found.  Cardiac Studies  Cardiac catheterization 05-05-2018:  Ost 1st Mrg lesion is 75% stenosed.  Prox Cx lesion is 100% stenosed. Left to left collaterals. Small AV groove circumflex.  Ost 1st Diag lesion is 100% stenosed. SVG to diag is patent.  Prox LAD lesion is 95% stenosed. LIMA to LAD is patent.  Prox RCA lesion is 95% stenosed. Mid Graft lesion is 95% stenosed in SVG to PLA.  A drug-eluting stent was successfully placed using a STENT RESOLUTE ONYX 2.0X15.  Post intervention, there is a 0% residual stenosis.  LV end diastolic pressure is normal.  There is no aortic valve stenosis.  Calcified  femoral artery.  Echocardiogram 03/2018: Study Conclusions  - Left ventricle: The cavity size was normal. Wall thickness was increased in a pattern of mild LVH. Systolic function was mildly reduced. The estimated ejection fraction was in the range of 45% to 50%. There is akinesis of the basal-midinferolateral and inferior myocardium. Doppler parameters are consistent with abnormal left ventricular relaxation (grade 1 diastolic dysfunction). - Mitral valve: Calcified annulus. There was mild regurgitation.  Impressions:  - Akinesis of the basal/mid inferior and inferolateral walls with overall mildly reduced LV systolic function; mild diastolic dysfunction; mild LVH; mild MR.  Patient Profile     77 y.o. female post cardiac VF arrest, revascularization of SVG to RCA graft on 05-05-2018 with improving encephalopathy, end-stage renal disease on hemodialysis, aspiration pneumonia, diabetes type 2 Telemetry  With some ploymorphic VT during first 48 hours tends toward bradycardia especially at night and with coughing   Assessment & Plan    Ventricular tachycardia nonsustained, torsades - within first 48 hours of MI and revascularization seen by EP and no indication for AICD/PPM -Off of Celexa since hospitalization.  Avoiding QT prolonging agents. -Magnesium and K ok  - Patient is DNR   Bradycardic episode 04/10/2018 morning - In the setting of cough, likely vagal. -had a couple 3-4 second asymptomatic sinus pauses will stop bystolic and all beta blockers   VF arrest - Encephalopathy improving day by day. -Revascularization SVG to RCA -Awaiting rehab placement.  End-stage renal disease on hemodialysis - Per nephrology team. Dialysis today        For questions or updates, please contact [place] Please consult [email or web address] for contact info under        Signed, Jenkins Rouge, MD  04/15/2018, 8:05 AM

## 2018-04-15 NOTE — Progress Notes (Signed)
Cardiology notified of cardiac pauses (4seconds) and bradycardia (31 non-sustained). Pt is asymptomatic, sleeping through events. When awaken pt is alert and responsive with no complaints. Pt reiterates desire to be DNR, states "I just want to go naturally if its Gods will." Vitals remain stable, will continue monitoring

## 2018-04-15 NOTE — Progress Notes (Signed)
[facility] Progress Note  Subjective: had some BP drops on HD yest, was not symptomatic.  Very faituged today.     Physical Exam: Vitals:   04/15/18 0632 04/15/18 0728 04/15/18 1158 04/15/18 1230  BP:  135/67 (!) 139/39 (!) 130/37  Pulse:  (!) 56 (!) 52 66  Resp:  (!) 21 18 16   Temp:  97.6 F (36.4 C) 97.9 F (36.6 C) 98.1 F (36.7 C)  TempSrc:  Oral Oral Axillary  SpO2:  97% 99% 98%  Weight: 72.8 kg     Height:        Inpatient medications: . aspirin  81 mg Oral Daily  . atorvastatin  40 mg Oral q1800  . B-complex with vitamin C  1 tablet Per Tube Daily  . brimonidine  1 drop Left Eye TID  . Chlorhexidine Gluconate Cloth  6 each Topical Q0600  . darbepoetin (ARANESP) injection - DIALYSIS  100 mcg Intravenous Q Thu-HD  . feeding supplement (NEPRO CARB STEADY)  237 mL Oral BID BM  . heparin  5,000 Units Subcutaneous Q8H  . hydrALAZINE  25 mg Oral Q8H  . insulin aspart  2-6 Units Subcutaneous TID WC  . insulin glargine  15 Units Subcutaneous Daily  . latanoprost  1 drop Both Eyes QHS  . mouth rinse  15 mL Mouth Rinse BID  . ticagrelor  90 mg Oral BID  . timolol  1 drop Left Eye Daily   . ferric gluconate (FERRLECIT/NULECIT) IV Stopped (04/14/18 1209)   acetaminophen, alum & mag hydroxide-simeth, bisacodyl, calcium carbonate, docusate, Gerhardt's butt cream, guaiFENesin-dextromethorphan, ondansetron (ZOFRAN) IV Exam: General: Lying tired elderly debilitated WF obese pleasant Heart:RRR, s1s2 nl Lungs: Clear bilateral, good air entry, no wheezing Abdomen: Soft, nontender, nondistended Extremities: No edema Dialysis Access: Left upper extremity AV graft has good bruit.   Dialysis:[facility]  4h 76kg 3K/ 2Ca bath LUA AVG Hep none - mircera 100 q 2   Impression/ Plan: 1) VF arrest: Positive troponin echo with new wall motion abnormalities. LVEF 40-45%.  S/p cath on 9/25 w/ PCI of the SVG to RCA. Now on aspirin and brilinta for at least 12 months.  Seen by EP, stable.   2) Vent tachycardia nonsustained, torsades (9/26- 9/27): prob reperfusion event after PCI per EP. No indication for ICD / PPM. Resolved.  3) ESRD: TTS HD. HD tomorrow.  4) Anemia: Hemoglobin around 9. Iron sat 15 %, ferritin 1432, on weekly nulecit. On mircera.  5) Secondary hyperparathyroidism: monitor ca, phos,   6) HTN/volume: BP acceptable.  On hydralazine.  Volume OK  7) DNR: family and pt request.  Cont dialysis for now.    [name] MD [facility] pager [phone]   04/15/2018, 1:15 PM   Recent Labs  Lab 04/11/18 0844  04/14/18 0221 04/15/18 0243  NA 135   < > 134* 136  K 4.0   < > 4.1 3.3*  CL 94*   < > 94* 95*  CO2 25   < > 19* 27  GLUCOSE 115*   < > 138* 95  BUN 34*   < > 47* 17  CREATININE 4.69*   < > 5.88* 3.21*  CALCIUM 8.9   < > 9.0 8.7*  PHOS 6.7*  --  7.2*  --   ALBUMIN 2.2*  --  2.5*  --    < > = values in this interval not displayed.   No results for input(s): AST, ALT, ALKPHOS, BILITOT, PROT in the  last 168 hours. Recent Labs  Lab 04/14/18 0221 04/15/18 0243  WBC 13.1* 9.0  HGB 10.3* 9.3*  HCT 32.1* 29.5*  MCV 104.6* 106.5*  PLT 230 186

## 2018-04-15 NOTE — Progress Notes (Signed)
Pt with fatigue, but readily willing to work with OT. Pt to d/c to CIR later today.   04/15/18 1300  OT Visit Information  Last OT Received On 04/15/18  Assistance Needed +1  History of Present Illness Pt adm with vfib arrest with out of hospital CPR x 12 minutes. Intubated 9/19-9/21. Pt also +Cdiff. PMH - ESRD with recent initiation of HD, cabg, ckd, dm, mi, lt knee surgery, neck surgery, back surgery, afib, CEA, CVA, multiple falls.   Precautions  Precautions Fall  Pain Assessment  Pain Assessment Faces  Faces Pain Scale 4  Pain Location back and L scapula area  Pain Descriptors / Indicators Sore  Pain Intervention(s) Heat applied;Repositioned  Cognition  Arousal/Alertness Awake/alert  Behavior During Therapy WFL for tasks assessed/performed  Overall Cognitive Status Within Functional Limits for tasks assessed  General Comments reports fatigue, pt HOH  ADL  Overall ADL's  Needs assistance/impaired  Eating/Feeding Set up;Sitting  Grooming Wash/dry hands;Wash/dry face;Sitting;Supervision/safety;Set up  Upper Body Dressing  Minimal assistance;Sitting  Toilet Transfer Minimal assistance;Ambulation;RW;BSC  Toileting- Clothing Manipulation and Hygiene Minimal assistance;Sit to/from stand  Functional mobility during ADLs Minimal assistance;Rolling walker  General ADL Comments pt with chief complaint of fatigue, but feels like she will get well with rehab  Bed Mobility  Overal bed mobility Needs Assistance  Bed Mobility Supine to Sit;Sit to Supine  Supine to sit Min guard  Sit to supine Min assist  General bed mobility comments min guard to raise trunk, increased time, assist for LEs back in to bed  Balance  Overall balance assessment Needs assistance  Sitting balance-Leahy Scale Fair  Standing balance support Bilateral upper extremity supported  Standing balance-Leahy Scale Poor  Transfers  Overall transfer level Needs assistance  Equipment used Rolling walker (2 wheeled)   Transfers Sit to/from Stand  Sit to Stand Min assist  General transfer comment min assist to rise and steady, cues for hand placement  OT - End of Session  Equipment Utilized During Treatment Gait belt;Rolling walker  Activity Tolerance Patient limited by fatigue;Patient limited by pain  Patient left in bed;with call bell/phone within reach;with family/visitor present  OT Assessment/Plan  OT Plan Discharge plan remains appropriate  OT Visit Diagnosis Unsteadiness on feet (R26.81);Other abnormalities of gait and mobility (R26.89);Muscle weakness (generalized) (M62.81)  OT Frequency (ACUTE ONLY) Min 2X/week  Follow Up Recommendations CIR;Supervision/Assistance - 24 hour  OT Equipment None recommended by OT  AM-PAC OT "6 Clicks" Daily Activity Outcome Measure  Help from another person eating meals? 4  Help from another person taking care of personal grooming? 3  Help from another person toileting, which includes using toliet, bedpan, or urinal? 3  Help from another person bathing (including washing, rinsing, drying)? 2  Help from another person to put on and taking off regular upper body clothing? 3  Help from another person to put on and taking off regular lower body clothing? 2  6 Click Score 17  ADL G Code Conversion CK  OT Goal Progression  Progress towards OT goals Progressing toward goals  Acute Rehab OT Goals  Patient Stated Goal return home  OT Goal Formulation With patient  Time For Goal Achievement 04/22/18  Potential to Achieve Goals Good  OT Time Calculation  OT Start Time (ACUTE ONLY) 1311  OT Stop Time (ACUTE ONLY) 1330  OT Time Calculation (min) 19 min  OT General Charges  $OT Visit 1 Visit  OT Treatments  $Self Care/Home Management  8-22 mins  Nestor Lewandowsky, OTR/L  Acute Rehabilitation Services Pager: [phone] Office: [phone]

## 2018-04-15 NOTE — Discharge Summary (Addendum)
[facility]  Physician Discharge Summary   Patient ID: [name] MRN: [id number] DOB/AGE: Nov 20, 1940 77 y.o.  Admit date: 04/02/2018 Discharge date: 04/15/2018  PCP: Josetta Huddle, MD  DISCHARGE DIAGNOSES:  Ventricular fibrillation with cardiac arrest Nonsustained ventricular tachycardia Coronary artery disease Bradycardia End-stage renal disease Recent C. Difficile Aspiration pneumonia   PATIENT BEING DISCHARGED TO [facility]  RECOMMENDATIONS FOR OUTPATIENT FOLLOW UP: 1. Patient will need to be followed by nephrology for dialysis while at the inpatient rehab 2. Would also benefit from being followed by cardiology periodically   DISCHARGE CONDITION: fair  Diet recommendation: Modified carbohydrate  Filed Weights   04/14/18 0752 04/14/18 1205 04/15/18 9833  Weight: 73.6 kg 72 kg 72.8 kg    INITIAL HISTORY: Patient is a 77 years old female with history of diabetes mellitus, hypertension, end-stage renal disease on hemodialysis presented to the hospital with ventricular fibrillation arrest.  Patient was evaluated by cardiology. 2 D echo showed reduced ejection fraction with regional wall motion abnormality.  Cardiac catheterization was performed on 04/09/2018 with placement of drug-eluting stent to the SVG to PLA.  Hospital course was complicated by aspiration pneumonia, C. difficile colitis, nonsustained V. tach, bradycardia and sinus pauses.  See below for details.   Consultations:  Cardiology  Nephrology  Procedures:  Cardiac catheterization Conclusion     Ost 1st Mrg lesion is 75% stenosed.  Prox Cx lesion is 100% stenosed. Left to left collaterals. Small AV groove circumflex.  Ost 1st Diag lesion is 100% stenosed. SVG to diag is patent.  Prox LAD lesion is 95% stenosed. LIMA to LAD is patent.  Prox RCA lesion is 95% stenosed. Mid Graft lesion is 95% stenosed in SVG to PLA.  A drug-eluting stent was successfully placed  using a STENT RESOLUTE ONYX 2.0X15.  Post intervention, there is a 0% residual stenosis.  LV end diastolic pressure is normal.  There is no aortic valve stenosis.  Calcified femoral artery.     Recommend uninterrupted dual antiplatelet therapy with Aspirin 81mg  daily and Ticagrelor 90mg  twice daily for a minimum of 12 months (ACS - Class I recommendation).    Transthoracic echocardiogram Study Conclusions  - Left ventricle: The cavity size was normal. Wall thickness was   increased in a pattern of mild LVH. Systolic function was mildly   reduced. The estimated ejection fraction was in the range of 45%   to 50%. There is akinesis of the basal-midinferolateral and   inferior myocardium. Doppler parameters are consistent with   abnormal left ventricular relaxation (grade 1 diastolic   dysfunction). - Mitral valve: Calcified annulus. There was mild regurgitation.  Impressions:  - Akinesis of the basal/mid inferior and inferolateral walls with   overall mildly reduced LV systolic function; mild diastolic   dysfunction; mild LVH; mild MR.    HOSPITAL COURSE:   Ventricular fibrillation arrest at outside hospital.  ROSC by the time of arrival to the ER.  Patient did not undergo hypothermia protocol.  2D echocardiogram on 04/03/2018 showed reduced ejection fraction of 45%.  Patient underwent cardiac catheterization and PCI on 04/09/2018.  Patient is currently on aspirin, Brilinta and statin.    Nonsustained ventricular tachycardia status post PCI.  Patient has been seen by EP cardiology.  There is no indication for AICD/permanent pacemaker.  Off Celexa.  Avoid QT prolonging medication. Patient is DNR.    NSTEMI/Coronary artery disease status post PCI Patient underwent cardiac catheterization and stent placement.  Bradycardia with approximate 6-second pause on 04/10/2018.  Patient was initially changed to Bystolic from metoprolol.  Continues to have bradycardia and asymptomatic  pauses.  Bystolic discontinued by cardiology today.  Discussed with Dr. Johnsie Cancel.  Okay for discharge to inpatient rehabilitation.   End-stage renal disease on hemodialysis.  Tuesday, Thursday Saturday.  Nephrology was on board for hemodialysis. Will need to continue dialysis at inpatient rehabilitation  Recent C. difficile colitis: Completed course of vancomycin on 04/13/2018.  Stools are more formed.  Aspiration pneumonia: Has completed 5-day course of Zosyn.  Currently patient is afebrile.  Prolonged QTc: Avoid QTC prolonging medication including SSRI.  Anemia of chronic kidney disease.    Hemoglobin is stable.  No evidence for overt blood loss.  Diabetes mellitus type II.  Continue to monitor blood glucose levels.  On Lantus and sliding scale insulin.    Overall stable.  Okay for discharge to inpatient rehabilitation.    PERTINENT LABS:  The results of significant diagnostics from this hospitalization (including imaging, microbiology, ancillary and laboratory) are listed below for reference.     Labs: Basic Metabolic Panel: Recent Labs  Lab 04/09/18 1922 04/10/18 0324 04/10/18 1146 04/11/18 0844 04/12/18 0910 04/14/18 0221 04/15/18 0243  NA 138 138  --  135 135 134* 136  K 3.9 3.9  --  4.0 3.6 4.1 3.3*  CL 96* 98  --  94* 96* 94* 95*  CO2 28 29  --  25 27 19* 27  GLUCOSE 119* 124*  --  115* 115* 138* 95  BUN 15 19  --  34* 22 47* 17  CREATININE 2.49* 3.13*  --  4.69* 3.37* 5.88* 3.21*  CALCIUM 8.7* 8.7*  --  8.9 8.9 9.0 8.7*  MG 2.0  --  2.0  --   --  2.0 2.0  PHOS  --   --   --  6.7*  --  7.2*  --    Liver Function Tests: Recent Labs  Lab 04/11/18 0844 04/14/18 0221  ALBUMIN 2.2* 2.5*   CBC: Recent Labs  Lab 04/10/18 0324 04/11/18 0844 04/12/18 0910 04/14/18 0221 04/15/18 0243  WBC 9.6 9.6 11.3* 13.1* 9.0  HGB 9.7* 9.1* 9.4* 10.3* 9.3*  HCT 30.7* 29.1* 30.3* 32.1* 29.5*  MCV 107.3* 106.6* 107.1* 104.6* 106.5*  PLT 171 188 186 230 186     CBG: Recent Labs  Lab 04/14/18 1345 04/14/18 1725 04/14/18 2152 04/15/18 0813 04/15/18 1233  GLUCAP 112* 145* 106* 89 125*     IMAGING STUDIES Dg Chest 2 View  Result Date: 04/10/2018 CLINICAL DATA:  ESRD on HD TThS, coronary disease, HTN, and IDDM who presented with VF arrest. Not cooled because was able to interact immediately after resuscitation. Also diarrhea on admission, Cdiff antigen positive and suspected aspiration pneumonia. EXAM: CHEST - 2 VIEW COMPARISON:  04/03/2018 FINDINGS: Status post median sternotomy and CABG. The heart is mildly enlarged. No pulmonary edema. There is focal opacity in the LEFT lung base, associated with small pleural effusion, consistent with early infiltrate or atelectasis. IMPRESSION: 1. LEFT LOWER lobe infiltrate/atelectasis and small effusion. 2. Cardiomegaly.  No edema. Electronically Signed   By: Nolon Nations M.D.   On: 04/10/2018 13:44   Dg Abd 1 View  Result Date: 04/03/2018 CLINICAL DATA:  OG tube placement EXAM: ABDOMEN - 1 VIEW COMPARISON:  12/04/2015 FINDINGS: OG tube tip is in the distal stomach or proximal duodenum. Nonobstructive bowel gas pattern noted in the upper abdomen. IMPRESSION: OG tube tip in the distal stomach or proximal duodenum. Electronically Signed  By: Rolm Baptise M.D.   On: 04/03/2018 09:35   Ct Head Wo Contrast  Result Date: 04/02/2018 CLINICAL DATA:  Status post cardiac arrest with altered mental status EXAM: CT HEAD WITHOUT CONTRAST TECHNIQUE: Contiguous axial images were obtained from the base of the skull through the vertex without intravenous contrast. COMPARISON:  January 11, 2017 FINDINGS: Brain: There is age related volume loss. There is no intracranial mass, hemorrhage, extra-axial fluid collection, or midline shift. Slight periventricular small vessel disease is stable. There is no new gray-white compartment lesion. No evident acute infarct. Basal ganglia calcification is stable and felt to be physiologic in  this age group. Vascular: No evident hyperdense vessel. There is calcification in the distal vertebral arteries and carotid siphon regions bilaterally. Skull: The bony calvarium appears intact. There is extensive ossification in the falx, a stable finding. Sinuses/Orbits: There is mucosal thickening in the right maxillary antrum and to a much lesser extent in the left maxillary antrum. There is diffuse opacification of the left sphenoid sinus with extensive mucosal thickening in the right sphenoid sinus. There is a retention cyst in the posterior left ethmoid region. There is opacification of several other ethmoid air cells. Orbits appear symmetric bilaterally. Other: Mastoid air cells are clear. Hearing assist devices are present in each external auditory canal. There is probable cerumen in the right external auditory canal. There is advanced arthropathy in the temporomandibular joints with flattening of the mandibular condyles bilaterally. There is intra-articular calcification in the left temporomandibular joint. IMPRESSION: 1. Age related volume loss with slight periventricular small vessel disease. No acute infarct. No mass or hemorrhage. 2.  Multiple foci of arterial vascular calcification. 3. Extensive multifocal paranasal sinus disease. Probable cerumen in the right external auditory canal. 4. 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 Electronically Signed   By: Lowella Grip III M.D.   On: 04/02/2018 20:04   Dg Chest Port 1 View  Result Date: 04/03/2018 CLINICAL DATA:  Check endotracheal tube placement EXAM: PORTABLE CHEST 1 VIEW COMPARISON:  04/02/2018 FINDINGS: Cardiac shadow is within normal limits. Lungs are well aerated bilaterally. New increased density in the right lung base is noted consistent with early infiltrate. Endotracheal tube and nasogastric catheter are again seen and stable. Aortic calcifications are noted. No bony abnormality  is seen. IMPRESSION: Right basilar infiltrate new from the prior exam. Electronically Signed   By: Inez Catalina M.D.   On: 04/03/2018 08:33   Dg Chest Portable 1 View  Result Date: 04/02/2018 CLINICAL DATA:  Endotracheal tube adjustment EXAM: PORTABLE CHEST 1 VIEW COMPARISON:  Chest radiograph 04/02/2018 at 7:06 p.m. FINDINGS: Slight retraction of endotracheal tube, which is slightly above the carina. Retraction by 3 cm would place the tip at the level of the clavicular heads. The enteric tube tip is below the field of view. Redemonstration of multiple rib fractures. No pneumothorax. Bibasilar atelectasis. Mild cardiomegaly. IMPRESSION: Endotracheal tube tip now above the carina. Retraction by another 3 cm would place the tip at the level of the clavicular heads. Electronically Signed   By: Ulyses Jarred M.D.   On: 04/02/2018 19:49   Dg Chest Port 1 View  Result Date: 04/02/2018 CLINICAL DATA:  Post cardiac arrest EXAM: PORTABLE CHEST 1 VIEW COMPARISON:  12/20/2017, 12/04/2015 FINDINGS: Postsurgical changes of the mediastinum. Tip of the endotracheal tube encroaches the right mainstem bronchus origin. No focal opacity or pleural effusion. Mild cardiomegaly with aortic atherosclerosis. No pneumothorax is seen.  Acute left first second and third rib fractures. Acute appearing right first and second rib fractures. IMPRESSION: 1. Endotracheal tube tip overlies the origin of right mainstem bronchus. Study nodes indicate that the ETT will be adjusted and a repeat image obtained. 2. Mild cardiomegaly 3. Bilateral upper rib fractures.  No definitive pneumothorax. Electronically Signed   By: Donavan Foil M.D.   On: 04/02/2018 19:26    DISCHARGE EXAMINATION: Vitals:   04/15/18 4103 04/15/18 0728 04/15/18 1158 04/15/18 1230  BP:  135/67 (!) 139/39 (!) 130/37  Pulse:  (!) 56 (!) 52 66  Resp:  (!) 21 18 16   Temp:  97.6 F (36.4 C) 97.9 F (36.6 C) 98.1 F (36.7 C)  TempSrc:  Oral Oral Axillary  SpO2:  97%  99% 98%  Weight: 72.8 kg     Height:       General appearance: alert, cooperative, appears stated age and no distress Resp: clear to auscultation bilaterally Cardio: regular rate and rhythm, S1, S2 normal, no murmur, click, rub or gallop GI: soft, non-tender; bowel sounds normal; no masses,  no organomegaly  DISPOSITION: CIR  Discharge Instructions    AMB Referral to Cardiac Rehabilitation - Phase II   Complete by:  As directed    Diagnosis:  NSTEMI       Current Inpatient Medications:  Scheduled: . aspirin  81 mg Oral Daily  . atorvastatin  40 mg Oral q1800  . B-complex with vitamin C  1 tablet Per Tube Daily  . brimonidine  1 drop Left Eye TID  . Chlorhexidine Gluconate Cloth  6 each Topical Q0600  . darbepoetin (ARANESP) injection - DIALYSIS  100 mcg Intravenous Q Thu-HD  . feeding supplement (NEPRO CARB STEADY)  237 mL Oral BID BM  . heparin  5,000 Units Subcutaneous Q8H  . hydrALAZINE  25 mg Oral Q8H  . insulin aspart  2-6 Units Subcutaneous TID WC  . insulin glargine  15 Units Subcutaneous Daily  . latanoprost  1 drop Both Eyes QHS  . mouth rinse  15 mL Mouth Rinse BID  . ticagrelor  90 mg Oral BID  . timolol  1 drop Left Eye Daily   Continuous: . ferric gluconate (FERRLECIT/NULECIT) IV Stopped (04/14/18 1209)   UDT:[id number], alum & mag hydroxide-simeth, bisacodyl, calcium carbonate, docusate, Gerhardt's butt cream, guaiFENesin-dextromethorphan, ondansetron (ZOFRAN) IV    TOTAL DISCHARGE TIME: 34 MINS  [facility] Pager [phone]  04/15/2018, 2:19 PM

## 2018-04-16 ENCOUNTER — Inpatient Hospital Stay (HOSPITAL_COMMUNITY): Payer: Medicare Other | Admitting: Physical Therapy

## 2018-04-16 ENCOUNTER — Inpatient Hospital Stay (HOSPITAL_COMMUNITY): Payer: Medicare Other

## 2018-04-16 ENCOUNTER — Inpatient Hospital Stay (HOSPITAL_COMMUNITY): Payer: Medicare Other | Admitting: Speech Pathology

## 2018-04-16 DIAGNOSIS — E1142 Type 2 diabetes mellitus with diabetic polyneuropathy: Secondary | ICD-10-CM

## 2018-04-16 DIAGNOSIS — I10 Essential (primary) hypertension: Secondary | ICD-10-CM

## 2018-04-16 DIAGNOSIS — Z992 Dependence on renal dialysis: Secondary | ICD-10-CM

## 2018-04-16 DIAGNOSIS — D638 Anemia in other chronic diseases classified elsewhere: Secondary | ICD-10-CM

## 2018-04-16 DIAGNOSIS — N186 End stage renal disease: Secondary | ICD-10-CM

## 2018-04-16 DIAGNOSIS — E1165 Type 2 diabetes mellitus with hyperglycemia: Secondary | ICD-10-CM

## 2018-04-16 DIAGNOSIS — D62 Acute posthemorrhagic anemia: Secondary | ICD-10-CM

## 2018-04-16 DIAGNOSIS — R001 Bradycardia, unspecified: Secondary | ICD-10-CM

## 2018-04-16 DIAGNOSIS — I472 Ventricular tachycardia: Secondary | ICD-10-CM

## 2018-04-16 DIAGNOSIS — R5381 Other malaise: Principal | ICD-10-CM

## 2018-04-16 LAB — CBC
HEMATOCRIT: 28.4 % — AB (ref 36.0–46.0)
HEMOGLOBIN: 8.9 g/dL — AB (ref 12.0–15.0)
MCH: 33.6 pg (ref 26.0–34.0)
MCHC: 31.3 g/dL (ref 30.0–36.0)
MCV: 107.2 fL — AB (ref 78.0–100.0)
Platelets: 212 10*3/uL (ref 150–400)
RBC: 2.65 MIL/uL — ABNORMAL LOW (ref 3.87–5.11)
RDW: 14.4 % (ref 11.5–15.5)
WBC: 10.5 10*3/uL (ref 4.0–10.5)

## 2018-04-16 LAB — RENAL FUNCTION PANEL
ANION GAP: 16 — AB (ref 5–15)
Albumin: 2.3 g/dL — ABNORMAL LOW (ref 3.5–5.0)
BUN: 41 mg/dL — AB (ref 8–23)
CHLORIDE: 95 mmol/L — AB (ref 98–111)
CO2: 24 mmol/L (ref 22–32)
Calcium: 8.7 mg/dL — ABNORMAL LOW (ref 8.9–10.3)
Creatinine, Ser: 5.46 mg/dL — ABNORMAL HIGH (ref 0.44–1.00)
GFR calc Af Amer: 8 mL/min — ABNORMAL LOW (ref >60)
GFR calc non Af Amer: 7 mL/min — ABNORMAL LOW (ref >60)
GLUCOSE: 226 mg/dL — AB (ref 70–99)
PHOSPHORUS: 5.3 mg/dL — AB (ref 2.5–4.6)
POTASSIUM: 3.5 mmol/L (ref 3.5–5.1)
Sodium: 135 mmol/L (ref 135–145)

## 2018-04-16 LAB — GLUCOSE, CAPILLARY
GLUCOSE-CAPILLARY: 95 mg/dL (ref 70–99)
GLUCOSE-CAPILLARY: 219 mg/dL — AB (ref 70–99)
GLUCOSE-CAPILLARY: 102 mg/dL — AB (ref 70–99)
Glucose-Capillary: 129 mg/dL — ABNORMAL HIGH (ref 70–99)

## 2018-04-16 MED ORDER — LIDOCAINE-PRILOCAINE 2.5-2.5 % EX CREA
1.0000 "application " | TOPICAL_CREAM | CUTANEOUS | Status: DC | PRN
  Filled 2018-04-16: qty 5

## 2018-04-16 MED ORDER — PENTAFLUOROPROP-TETRAFLUOROETH EX AERO: 1.0000 "application " | INHALATION_SPRAY | CUTANEOUS | Status: DC | PRN

## 2018-04-16 MED ORDER — HEPARIN SODIUM (PORCINE) 1000 UNIT/ML DIALYSIS
1000.0000 [IU] | INTRAMUSCULAR | Status: DC | PRN
  Filled 2018-04-16: qty 1

## 2018-04-16 MED ORDER — SODIUM CHLORIDE 0.9 % IV SOLN: 100.0000 mL | INTRAVENOUS | Status: DC | PRN

## 2018-04-16 MED ORDER — DARBEPOETIN ALFA 100 MCG/0.5ML IJ SOSY
PREFILLED_SYRINGE | INTRAMUSCULAR | Status: AC
  Filled 2018-04-16: qty 0.5

## 2018-04-16 MED ORDER — ALTEPLASE 2 MG IJ SOLR: 2.0000 mg | Freq: Once | INTRAMUSCULAR | Status: DC | PRN

## 2018-04-16 MED ORDER — LIDOCAINE HCL (PF) 1 % IJ SOLN
5.0000 mL | INTRAMUSCULAR | Status: DC | PRN
  Filled 2018-04-16: qty 5

## 2018-04-16 NOTE — Progress Notes (Signed)
On call provider was called earlier in the shift due to patients request for cough syrup. Mentioned to provider the report given by previous nurse about code status & 6 second pause. Upon researching, patient had this event this morning prior to being admitted to our unit & was on telemonitoring. Her vitals were taken & pulse & oxygen are being monitored until rounds in the morning as per verbal order. At that time, her code staus will also be clarified. No acute distress noted.

## 2018-04-16 NOTE — Progress Notes (Signed)
Dr. Lavell Islam regarding pt's code status. No new orders at this time. Continue plan of care.  [name] W Willaim Mode

## 2018-04-16 NOTE — Progress Notes (Signed)
[facility] Progress Note  Subjective: no new c/o's   Physical Exam: Vitals:   04/15/18 1817 04/15/18 2005 04/15/18 2124 04/16/18 0405  BP:  (!) 150/37  126/67  Pulse:  65 66 78  Resp:  16  18  Temp:  98.2 F (36.8 C)  98 F (36.7 C)  TempSrc:  Oral  Oral  SpO2:  97% 96% 96%  Weight: 73.2 kg   73.3 kg  Height: 5\' 5"  (1.651 m)       Inpatient medications: . aspirin  81 mg Oral Daily  . atorvastatin  40 mg Oral q1800  . B-complex with vitamin C  1 tablet Per Tube Daily  . brimonidine  1 drop Left Eye TID  . darbepoetin (ARANESP) injection - DIALYSIS  100 mcg Intravenous Q Thu-HD  . feeding supplement (NEPRO CARB STEADY)  237 mL Oral BID BM  . heparin  5,000 Units Subcutaneous Q8H  . hydrALAZINE  25 mg Oral Q8H  . insulin aspart  2-6 Units Subcutaneous TID WC  . insulin glargine  15 Units Subcutaneous Daily  . latanoprost  1 drop Both Eyes QHS  . ticagrelor  90 mg Oral BID  . timolol  1 drop Left Eye Daily   . [START ON 04/21/2018] ferric gluconate (FERRLECIT/NULECIT) IV     acetaminophen, calcium carbonate, Gerhardt's butt cream, guaiFENesin-dextromethorphan Exam: General: Lying tired elderly debilitated WF obese pleasant Heart:RRR, s1s2 nl Lungs: Clear bilateral, good air entry, no wheezing Abdomen: Soft, nontender, nondistended Extremities: No edema Dialysis Access: Left upper extremity AV graft has good bruit.   Dialysis:[facility]  4h 76kg 3K/ 2Ca bath LUA AVG Hep none - mircera 100 q 2   Impression/ Plan: 1) VF arrest: Positive troponin echo with new wall motion abnormalities. LVEF 40-45%.  S/p cath on 9/25 w/ PCI of the SVG to RCA. Now on aspirin and brilinta for at least 12 months. Seen by EP, stable.   2) Vent tachycardia nonsustained, torsades (9/26- 9/27): prob reperfusion event after PCI per EP. No indication for ICD / PPM. Resolved.  3) ESRD: TTS HD. HD today. Under dry, no fluid off today.   4) Anemia: Hemoglobin around 9. Iron  sat 15 %, ferritin 1432, on weekly nulecit. On mircera.  5) Secondary hyperparathyroidism: monitor ca, phos,   6) HTN/volume: BP acceptable.  On hydralazine.  Volume OK  7) Limited code: yes to CPR and meds, no to intubation/ defibrillation/ NIPPV  8)  Debility: significant issue, on CIR now   [name] MD [facility] pager [phone]   04/16/2018, 10:50 AM   Recent Labs  Lab 04/11/18 0844  04/14/18 0221 04/15/18 0243 04/15/18 1708  NA 135   < > 134* 136  --   K 4.0   < > 4.1 3.3*  --   CL 94*   < > 94* 95*  --   CO2 25   < > 19* 27  --   GLUCOSE 115*   < > 138* 95  --   BUN 34*   < > 47* 17  --   CREATININE 4.69*   < > 5.88* 3.21* 4.43*  CALCIUM 8.9   < > 9.0 8.7*  --   PHOS 6.7*  --  7.2*  --   --   ALBUMIN 2.2*  --  2.5*  --   --    < > = values in this interval not displayed.   No results for input(s): AST, ALT, ALKPHOS,  BILITOT, PROT in the last 168 hours. Recent Labs  Lab 04/15/18 0243 04/15/18 1708  WBC 9.0 9.8  HGB 9.3* 9.3*  HCT 29.5* 29.7*  MCV 106.5* 108.0*  PLT 186 208

## 2018-04-16 NOTE — Progress Notes (Signed)
[facility]     PROGRESS NOTE  Subjective/Complaints:  Patient seen sitting up in bed eating breakfast, working with SLP this morning.  She states she slept well overnight.  She states she is tired, but ready to begin therapies.  ROS: Denies CP, S OB, nausea, vomiting, diarrhea.  Objective: Vital Signs: Blood pressure 126/67, pulse 78, temperature 98 F (36.7 C), temperature source Oral, resp. rate 18, height 5\' 5"  (1.651 m), weight 73.3 kg, SpO2 96 %. No results found. Recent Labs    04/15/18 0243 04/15/18 1708  WBC 9.0 9.8  HGB 9.3* 9.3*  HCT 29.5* 29.7*  PLT 186 208   Recent Labs    04/14/18 0221 04/15/18 0243 04/15/18 1708  NA 134* 136  --   K 4.1 3.3*  --   CL 94* 95*  --   GLUCOSE 138* 95  --   BUN 47* 17  --   CREATININE 5.88* 3.21* 4.43*  CALCIUM 9.0 8.7*  --    CBG (last 3)  Recent Labs    04/15/18 1706 04/15/18 2123 04/16/18 0613  GLUCAP 159* 182* 95    Wt Readings from Last 3 Encounters:  04/16/18 73.3 kg  04/15/18 72.8 kg  01/28/18 77.1 kg    Physical Exam:  BP 126/67 (BP Location: Right Arm)   Pulse 78   Temp 98 F (36.7 C) (Oral)   Resp 18   Ht 5\' 5"  (1.651 m)   Wt 73.3 kg   SpO2 96%   BMI 26.88 kg/m  Constitutional: She appears well-developed and well-nourished. Fatigued  HENT: Normocephalic and atraumatic.  Eyes: EOM are normal.  No discharge.  Cardiovascular: Normal rate and regular rhythm.  No JVD. Respiratory: Effort normal and breath sounds normal.  GI: Bowel sounds are normal. She exhibits no distension.  Musculoskeletal: Generalized edema  Neurological: She is alert.  A&Ox3 with cues Motor: 4-/5 grossly thoughout  Skin: Skin is warm and dry.  Psychiatric: Her speech is delayed. She is slowed.   Assessment/Plan: 1. Functional deficits secondary to debility which require 3+ hours per day of interdisciplinary therapy in a comprehensive inpatient rehab setting. Physiatrist is providing  close team supervision and 24 hour management of active medical problems listed below. Physiatrist and rehab team continue to assess barriers to discharge/monitor patient progress toward functional and medical goals.  Function:  Bathing Bathing position      Bathing parts      Bathing assist        Upper Body Dressing/Undressing Upper body dressing                    Upper body assist        Lower Body Dressing/Undressing Lower body dressing                                  Lower body assist        Toileting Toileting          Toileting assist     Transfers Chair/bed transfer             Locomotion Ambulation           Wheelchair          Cognition Comprehension    Expression    Social Interaction    Problem Solving    Memory      Medical Problem List and Plan:  1.  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  DVT Prophylaxis/Anticoagulation: Subcutaneous heparin.  Monitor for any bleeding episodes 3. Pain Management: Tylenol as needed 4. Mood: Provide emotional support.  Off of Celexa since hospitalization.  Avoiding QT prolonging agents 5. Neuropsych: This patient is ?fully capable of making decisions on her own behalf. 6. Skin/Wound Care: Routine skin checks 7. Fluids/Electrolytes/Nutrition: Routine in and outs with follow-up chemistries 8.  Hypertension.  Hydralazine 25 mg every 8 hours  Monitor with increased mobility 9.  Nonsustained V. Tach/bradycardia.  Patient is asymptomatic.  Avoid QT prolonging agents.  Follow-up per cardiology services.  Patient is DNR 10.  C. difficile.  Flexi-Seal discontinued.  Contact precautions.  Vancomycin completed  Will inquire about d/cing precautions 11.  End-stage renal disease with hemodialysis.  Hemodialysis recently initiated 3 to 4 weeks ago.  Follow-up per renal services 12.  Diabetes mellitus with peripheral  neuropathy.  Lantus insulin 15 units daily.  Check blood sugars before meals and at bedtime  Monitor with increased mobility 13.  History of CAD with CABG 2013.  Continue aspirin 14.  Hyperlipidemia.  Lipitor 15.  Acute on chronic anemia.  Continue Aranesp  Hemoglobin 9.3 on 10/2  Continue to monitor  LOS (Days) 1 A FACE TO FACE EVALUATION WAS PERFORMED  Cicily Bonano Lorie Phenix 04/16/2018 8:56 AM

## 2018-04-16 NOTE — Evaluation (Signed)
Speech Language Pathology Assessment and Plan  Patient Details  Name: [name] MRN: [id number] Date of Birth: [birth year]  SLP Diagnosis: N/A Rehab Potential: N/A ELOS: N/A   Today's Date: 04/16/2018 SLP Individual Time: 9518-8416 SLP Individual Time Calculation (min): 55 min   Problem List:  Patient Active Problem List   Diagnosis Date Noted  . Debility   . Benign essential HTN   . Bradycardia   . Nonsustained ventricular tachycardia ([place])   . Enteritis due to Clostridium difficile   . ESRD on dialysis ([place])   . Diabetic peripheral neuropathy ([place])   . Acute blood loss anemia   . Anemia of chronic disease   . Aspiration into airway   . NSTEMI (non-ST elevated myocardial infarction) ([place])   . QT prolongation 04/03/2018  . Acute respiratory failure with hypoxia ([place])   . Aspiration pneumonia due to gastric secretions ([place])   . Cardiac arrest ([place]) 04/02/2018  . Anxiety 05/13/2017  . Panic attack 05/13/2017  . SAH (subarachnoid hemorrhage) ([place]) 02/03/2017  . Fall 02/03/2017  . Spinal stenosis, lumbar region 03/02/2016  . Trochanteric bursitis of left hip 01/03/2016  . Generalized weakness 12/21/2015  . Acute hypokalemia 12/21/2015  . Lumbar back pain with radiculopathy affecting left lower extremity 12/04/2015  . Coronary artery disease involving native coronary artery of native heart without angina pectoris   . CRI (chronic renal insufficiency), stage 4 (severe) (HCC)   . Urinary retention   . Anemia of chronic kidney failure   . Hx of gout   . Thrombocytopenia ([place])   . S/P lumbar discectomy 12/01/2015  . 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 Acute encephalopathy   . Chronic kidney disease (CKD), stage V ([place]) 07/13/2015  . Glaucoma   . Atrial fibrillation ([place])   . CAD (coronary artery disease)   . Depression   . Stroke ([place])   . Diabetes mellitus without complication ([place])    . Gastroesophageal reflux disease with esophagitis   . Carotid artery stenosis 04/21/2015  . Essential hypertension 06/08/2014  . Hyperlipidemia 06/08/2014  . Occlusion and stenosis of carotid artery without mention of cerebral infarction 01/01/2012   Past Medical History:  Past Medical History:  Diagnosis Date  . Anemia   . Arthritis   . Atrial fibrillation ([place])   . Blood transfusion   . CAD (coronary artery disease)   . Cancer ([place])    .  top of head- melonoma  . Carotid artery occlusion    Carotid Endartectom,y - left 2009.  Blockage Right being watched by Dr Scot Dock.  . Carotid stenosis   . 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 Complication of anesthesia    pt. states that she was difficult to wake  . Depression   . Diabetes mellitus without complication ([place])   . Dysrhythmia   . General weakness 12/2015  . GERD (gastroesophageal reflux disease)   . Glaucoma   . History of hiatal hernia   . History of kidney stones    passed  . History of pneumonia   . HOH (hard of hearing)   . Hypertension   . Hypokalemia 12/2015  . Myocardial infarction ([place])   . Neuromuscular disorder ([place])    CARPEL TUNNEL  . Pneumonia   . Shortness of breath   . Stroke ([place])    hx of TIA   Past Surgical History:  Past Surgical History:  Procedure Laterality Date  . A/V FISTULAGRAM N/A 07/21/2017   Procedure: A/V FISTULAGRAM - Left Arm;  Surgeon: Angelia Mould, MD;  Location: [facility];  Service: Cardiovascular;  Laterality: N/A;  . AV FISTULA PLACEMENT Left 05/31/2014   Procedure: Creation of Left Arm arteriovenous brachiocephalic Fistula;  Surgeon: Angelia Mould, MD;  Location: [facility];  Service: Vascular;  Laterality: Left;  . AV FISTULA PLACEMENT Left 09/01/2014   Procedure: INSERTION OF ARTERIOVENOUS (AV) GORE-TEX GRAFT LEFT UPPER ARM USING  4-7 MM X 45 CM SRTETCH GORETEX GRAFT;  Surgeon: Angelia Mould, MD;  Location: [facility];  Service:  Vascular;  Laterality: Left;  . BACK SURGERY    . CARDIAC CATHETERIZATION    . CAROTID ENDARTERECTOMY Left 2009    CEA  . carpel tunnel    . COLONOSCOPY  07/25/2011   Procedure: COLONOSCOPY;  Surgeon: Winfield Cunas., MD;  Location: [facility];  Service: Endoscopy;  Laterality: N/A;  . CORONARY ARTERY BYPASS GRAFT  07/29/2011   Procedure: CORONARY ARTERY BYPASS GRAFTING (CABG);  Surgeon: Gaye Pollack, MD;  Location: [facility];  Service: Open Heart Surgery;  Laterality: N/A;  . CORONARY STENT INTERVENTION N/A 04/08/2018   Procedure: CORONARY STENT INTERVENTION;  Surgeon: Jettie Booze, MD;  Location: [facility];  Service: Cardiovascular;  Laterality: N/A;  . ENDARTERECTOMY Right 04/21/2015   Procedure: ENDARTERECTOMY CAROTID;  Surgeon: Angelia Mould, MD;  Location: [facility];  Service: Vascular;  Laterality: Right;  . ESOPHAGOGASTRODUODENOSCOPY  07/25/2011   Procedure: ESOPHAGOGASTRODUODENOSCOPY (EGD);  Surgeon: Winfield Cunas., MD;  Location: [facility];  Service: Endoscopy;  Laterality: N/A;  . EYE SURGERY    . FISTULOGRAM Left 08/29/2014   Procedure: FISTULOGRAM;  Surgeon: Angelia Mould, MD;  Location: [facility];  Service: Cardiovascular;  Laterality: Left;  . KNEE SURGERY Left   . LEFT HEART CATH AND CORS/GRAFTS ANGIOGRAPHY N/A 04/08/2018   Procedure: LEFT HEART CATH AND CORS/GRAFTS ANGIOGRAPHY;  Surgeon: Jettie Booze, MD;  Location: [facility];  Service: Cardiovascular;  Laterality: N/A;  . LUMBAR LAMINECTOMY/DECOMPRESSION MICRODISCECTOMY N/A 12/01/2015   Procedure: L5-S1 Decompression and Bilateral Microdiscectomy;  Surgeon: Marybelle Killings, MD;  Location: [facility];  Service: Orthopedics;  Laterality: N/A;  . NECK SURGERY    . PERIPHERAL VASCULAR BALLOON ANGIOPLASTY  07/21/2017   Procedure: PERIPHERAL VASCULAR BALLOON ANGIOPLASTY;  Surgeon: Angelia Mould, MD;  Location: [facility];  Service: Cardiovascular;;  LT Arm AVF  .  TONSILLECTOMY      Assessment / Plan / Recommendation Clinical Impression Patient is a 77 year old right-handed female history of irritable bowel syndrome, end-stage renal disease with hemodialysis recently initiated approximately 3 to 4 weeks ago, atrial fibrillation, CAD with CABG 2013 maintained on aspirin, left carotid enterectomy, hypertension, diabetes mellitus as well as history of lumbar radiculopathy with L5-S1 microdiscectomy receiving inpatient rehab services May 2017.  Per chart review and family, lives with spouse.  Independent with assistive device prior to admission.  Daughter lives next door.  2 level home with bed and bath on main level.  Presented 04/02/2018 after witnessed out of hospital cardiac arrest.  CPR initiated.  EMS arrived within 10 minutes found in V. fib and intubated in the field.  Noted systolic blood pressure in the 220s.  Troponin 1.17, lactic acid 5.11, WBC 18,900.  Placed on heparin drip.  Echocardiogram with ejection fraction of 50% akinesis of the basal mid inferior lateral and  inferior myocardium grade 1 diastolic dysfunction.  Placed on broad-spectrum antibiotics for elevated WBC question aspiration pneumonia.  Cardiology follow-up cardiac catheterization 04/08/2018 that showed proximal LCx lesion 100% stenosis in proximal LAD 95% stenosis.  Patient underwent successful stenting per Dr.Varanasi and maintained on aspirin and Brilinta therapy.  Hemodialysis ongoing as per renal services.  Hospital course positive C. difficile with Flexi-Seal placed contact precautions completing course of vancomycin for C. difficile.  Subcutaneous heparin for DVT prophylaxis.  Patient did remain intubated through 04/04/2018.  Patient with episode of bradycardia 04/10/2018 and remained asymptomatic with follow-up per cardiology services with EKG showing P-P prolongation noted while coughing and also episode while sleeping. Beta-blocker was discontinued.   Therapy evaluations completed with  recommendations of physical medicine rehab consult.  Patient was admitted for a comprehensive rehab program 04/15/18.  Patient appeared lethargic with frequent reports of feeling "worn out," (MD and RN aware) throughout evaluation. Although lethargic, patient's overall cognitive functioning appeared [facility] for all tasks assessed with patient sustaining attention to tasks, recalling daily and functional information and demonstrating efficient problem solving during basic tasks. Suspect patient is at her baseline level of cognitive functioning, therefore, skilled SLP intervention is not warranted at this time. Patient verbalized understanding and agreement.    Skilled Therapeutic Interventions          Administered a cognitive-linguistic evaluation, please see above for details. Educated patient on current cognitive functioning without SLP f/u warranted at this time. She verbalized understanding and agreement.   SLP Assessment  N/A   Recommendations  N/A   SLP Frequency N/A  SLP Duration  SLP Intensity  SLP Treatment/Interventions N/A  N/A  N/A   Pain No/Denies Pain   Short Term Goals: N/A   Refer to Care Plan for Long Term Goals  Recommendations for other services: Neuropsych  Discharge Criteria: Patient will be discharged from SLP if patient refuses treatment 3 consecutive times without medical reason, if treatment goals not met, if there is a change in medical status, if patient makes no progress towards goals or if patient is discharged from hospital.  The above assessment, treatment plan, treatment alternatives and goals were discussed and mutually agreed upon: by patient  Wyland Rastetter 04/16/2018, 7:01 AM

## 2018-04-16 NOTE — Evaluation (Signed)
Occupational Therapy Assessment and Plan  Patient Details  Name: [name] Date of Birth: 06-09-1941  OT Diagnosis: muscle weakness (generalized) Rehab Potential: Rehab Potential (ACUTE ONLY): Fair ELOS: 21-25 days   Today's Date: 04/16/2018 OT Individual Time: 1100-1204 OT Individual Time Calculation (min): 64 min     Problem List:  Patient Active Problem List   Diagnosis Date Noted  . Poorly controlled type 2 diabetes mellitus with peripheral neuropathy ([place])   . Debility   . Benign essential HTN   . Bradycardia   . Nonsustained ventricular tachycardia ([place])   . Enteritis due to Clostridium difficile   . ESRD on dialysis ([place])   . Diabetic peripheral neuropathy ([place])   . Acute blood loss anemia   . Anemia of chronic disease   . Aspiration into airway   . NSTEMI (non-ST elevated myocardial infarction) ([place])   . QT prolongation 04/03/2018  . Acute respiratory failure with hypoxia ([place])   . Aspiration pneumonia due to gastric secretions ([place])   . Cardiac arrest ([place]) 04/02/2018  . Anxiety 05/13/2017  . Panic attack 05/13/2017  . SAH (subarachnoid hemorrhage) ([place]) 02/03/2017  . Fall 02/03/2017  . Spinal stenosis, lumbar region 03/02/2016  . Trochanteric bursitis of left hip 01/03/2016  . Generalized weakness 12/21/2015  . Acute hypokalemia 12/21/2015  . Lumbar back pain with radiculopathy affecting left lower extremity 12/04/2015  . Coronary artery disease involving native coronary artery of native heart without angina pectoris   . CRI (chronic renal insufficiency), stage 4 (severe) (HCC)   . Urinary retention   . Anemia of chronic kidney failure   . Hx of gout   . Thrombocytopenia ([place])   . S/P lumbar discectomy 12/01/2015  . 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 Acute encephalopathy   . Chronic kidney disease (CKD), stage V ([place]) 07/13/2015  . Glaucoma   . Atrial  fibrillation ([place])   . CAD (coronary artery disease)   . Depression   . Stroke ([place])   . Diabetes mellitus without complication ([place])   . Gastroesophageal reflux disease with esophagitis   . Carotid artery stenosis 04/21/2015  . Essential hypertension 06/08/2014  . Hyperlipidemia 06/08/2014  . Occlusion and stenosis of carotid artery without mention of cerebral infarction 01/01/2012    Past Medical History:  Past Medical History:  Diagnosis Date  . Anemia   . Arthritis   . Atrial fibrillation ([place])   . Blood transfusion   . CAD (coronary artery disease)   . Cancer ([place])    .  top of head- melonoma  . Carotid artery occlusion    Carotid Endartectom,y - left 2009.  Blockage Right being watched by Dr Scot Dock.  . Carotid stenosis   . 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 Complication of anesthesia    pt. states that she was difficult to wake  . Depression   . Diabetes mellitus without complication ([place])   . Dysrhythmia   . General weakness 12/2015  . GERD (gastroesophageal reflux disease)   . Glaucoma   . History of hiatal hernia   . History of kidney stones    passed  . History of pneumonia   . HOH (hard of hearing)   . Hypertension   . Hypokalemia 12/2015  . Myocardial infarction ([place])   . Neuromuscular disorder ([place])    CARPEL TUNNEL  . Pneumonia   . Shortness of  breath   . Stroke ([place])    hx of TIA   Past Surgical History:  Past Surgical History:  Procedure Laterality Date  . A/V FISTULAGRAM N/A 07/21/2017   Procedure: A/V FISTULAGRAM - Left Arm;  Surgeon: Angelia Mould, MD;  Location: [facility];  Service: Cardiovascular;  Laterality: N/A;  . AV FISTULA PLACEMENT Left 05/31/2014   Procedure: Creation of Left Arm arteriovenous brachiocephalic Fistula;  Surgeon: Angelia Mould, MD;  Location: [facility];  Service: Vascular;  Laterality: Left;  . AV FISTULA PLACEMENT Left 09/01/2014   Procedure: INSERTION OF ARTERIOVENOUS (AV)  GORE-TEX GRAFT LEFT UPPER ARM USING  4-7 MM X 45 CM SRTETCH GORETEX GRAFT;  Surgeon: Angelia Mould, MD;  Location: [facility];  Service: Vascular;  Laterality: Left;  . BACK SURGERY    . CARDIAC CATHETERIZATION    . CAROTID ENDARTERECTOMY Left 2009    CEA  . carpel tunnel    . COLONOSCOPY  07/25/2011   Procedure: COLONOSCOPY;  Surgeon: Winfield Cunas., MD;  Location[facility] ENDOSCOPY;  Service: Endoscopy;  Laterality: N/A;  . CORONARY ARTERY BYPASS GRAFT  07/29/2011   Procedure: CORONARY ARTERY BYPASS GRAFTING (CABG);  Surgeon: Gaye Pollack, MD;  Location: [facility];  Service: Open Heart Surgery;  Laterality: N/A;  . CORONARY STENT INTERVENTION N/A 04/08/2018   Procedure: CORONARY STENT INTERVENTION;  Surgeon: Jettie Booze, MD;  Location: [facility];  Service: Cardiovascular;  Laterality: N/A;  . ENDARTERECTOMY Right 04/21/2015   Procedure: ENDARTERECTOMY CAROTID;  Surgeon: Angelia Mould, MD;  Location: [facility];  Service: Vascular;  Laterality: Right;  . ESOPHAGOGASTRODUODENOSCOPY  07/25/2011   Procedure: ESOPHAGOGASTRODUODENOSCOPY (EGD);  Surgeon: Winfield Cunas., MD;  Location[facility] ENDOSCOPY;  Service: Endoscopy;  Laterality: N/A;  . EYE SURGERY    . FISTULOGRAM Left 08/29/2014   Procedure: FISTULOGRAM;  Surgeon: Angelia Mould, MD;  Location: [facility] CATH LAB;  Service: Cardiovascular;  Laterality: Left;  . KNEE SURGERY Left   . LEFT HEART CATH AND CORS/GRAFTS ANGIOGRAPHY N/A 04/08/2018   Procedure: LEFT HEART CATH AND CORS/GRAFTS ANGIOGRAPHY;  Surgeon: Jettie Booze, MD;  Location: [facility] [facility];  Service: Cardiovascular;  Laterality: N/A;  . LUMBAR LAMINECTOMY/DECOMPRESSION MICRODISCECTOMY N/A 12/01/2015   Procedure: L5-S1 Decompression and Bilateral Microdiscectomy;  Surgeon: Marybelle Killings, MD;  Location: [facility];  Service: Orthopedics;  Laterality: N/A;  . NECK SURGERY    . PERIPHERAL VASCULAR BALLOON ANGIOPLASTY  07/21/2017   Procedure: PERIPHERAL VASCULAR  BALLOON ANGIOPLASTY;  Surgeon: Angelia Mould, MD;  Location: [facility];  Service: Cardiovascular;;  LT Arm AVF  . TONSILLECTOMY      Assessment & Plan Clinical Impression: [name] is a 77 year old right-handed female history of irritable bowel syndrome, end-stage renal disease with hemodialysis recently initiated approximately 3 to 4 weeks ago, atrial fibrillation, CAD with CABG 2013 maintained on aspirin, left carotid enterectomy, hypertension, diabetes mellitus as well as history of lumbar radiculopathy with L5-S1 microdiscectomy receiving inpatient rehab services May 2017.  Per chart review and family, lives with spouse.  Independent with assistive device prior to admission.  Daughter lives next door.  2 level home with bed and bath on main level.  Presented 04/02/2018 after witnessed out of hospital cardiac arrest.  CPR initiated.  EMS arrived within 10 minutes found in V. fib and intubated in the field.  Noted systolic blood pressure in the 220s.  Troponin 1.17, lactic acid 5.11,  WBC 18,900.  Placed on heparin drip.  Echocardiogram with ejection fraction of 50% akinesis of the basal mid inferior lateral and inferior myocardium grade 1 diastolic dysfunction.  Placed on broad-spectrum antibiotics for elevated WBC question aspiration pneumonia.  Cardiology follow-up cardiac catheterization 04/08/2018 that showed proximal LCx lesion 100% stenosis in proximal LAD 95% stenosis.  Patient underwent successful stenting per Dr.Varanasi and maintained on aspirin and Brilinta therapy.  Hemodialysis ongoing as per renal services.  Hospital course positive C. difficile with Flexi-Seal placed contact precautions completing course of vancomycin for C. difficile.  Subcutaneous heparin for DVT prophylaxis.  Patient did remain intubated through 04/04/2018.  Patient with episode of bradycardia 04/10/2018 and remained asymptomatic with follow-up per cardiology services with EKG showing P-P prolongation  noted while coughing and also episode while sleeping. Beta-blocker was discontinued.   Therapy evaluations completed with recommendations of physical medicine rehab consult.  Patient was admitted for a comprehensive rehab program.  Patient transferred to CIR on 04/15/2018 .    Patient currently requires mod with basic self-care skills secondary to muscle weakness, decreased cardiorespiratoy endurance and decreased sitting balance, decreased standing balance and decreased postural control.  Prior to hospitalization, patient could complete ADLs with modified independent .  Patient will benefit from skilled intervention to decrease level of assist with basic self-care skills and increase level of independence with iADL prior to discharge home with care partner.  Anticipate patient will require intermittent supervision and minimal physical assistance and follow up home health.  OT - End of Session Activity Tolerance: Tolerates 10 - 20 min activity with multiple rests Endurance Deficit: Yes Endurance Deficit Description: generalized weakness/deconditioning  OT Assessment Rehab Potential (ACUTE ONLY): Fair OT Patient demonstrates impairments in the following area(s): Balance;Endurance;Motor;Safety OT Basic ADL's Functional Problem(s): Eating;Grooming;Bathing;Dressing;Toileting OT Transfers Functional Problem(s): Toilet;Tub/Shower OT Additional Impairment(s): None OT Plan OT Intensity: Minimum of 1-2 x/day, 45 to 90 minutes OT Frequency: 5 out of 7 days OT Duration/Estimated Length of Stay: 21-25 days OT Treatment/Interventions: Balance/vestibular training;Community reintegration;Disease mangement/prevention;Patient/family education;Self Care/advanced ADL retraining;Therapeutic Exercise;UE/LE Coordination activities;Discharge planning;Cognitive remediation/compensation;DME/adaptive equipment instruction;Functional mobility training;Pain management;Psychosocial support;Skin care/wound  managment;Therapeutic Activities;UE/LE Strength taining/ROM OT Self Feeding Anticipated Outcome(s): mod I OT Basic Self-Care Anticipated Outcome(s): (S) OT Toileting Anticipated Outcome(s): (S) OT Bathroom Transfers Anticipated Outcome(s): (S) OT Recommendation Patient destination: Home Follow Up Recommendations: Home health OT Equipment Recommended: To be determined   Skilled Therapeutic Intervention Pt seen for skilled OT evaluation. Pt and her husband edu re OT POC, ELOS, d/c planning, and rehab expectations. Skilled monitoring of vitals throughout session, with BP 131/39 supine and 131/35 sitting EOB with c/o dizziness throughout session. Pt completed bathing EOB as described below with frequent vc for arousal and attention to task. Pt experienced bowel incontinence in brief and completed stand pivot to [facility] with mod A. Pt required total A for peri hygiene while in standing, progressing to requiring max A d/t fatigue. Pt required 3x brief change and clean up d/t heavy bowel incontinence. Pt returned to bed and was left supine in bed.   OT Evaluation Precautions/Restrictions  Precautions Precautions: Fall Restrictions Weight Bearing Restrictions: No General Chart Reviewed: Yes Family/Caregiver Present: Yes(husband) Vital Signs Therapy Vitals Temp: 97.9 F (36.6 C) Temp Source: Oral Pulse Rate: 73 Resp: 16 BP: (!) 120/42 Patient Position (if appropriate): Lying Oxygen Therapy SpO2: 100 % O2 Device: Nasal Cannula O2 Flow Rate (L/min): 2 L/min Pain Pain Assessment Pain Scale: 0-10 Pain Score: 0-No pain Home Living/Prior Functioning Home Living Family/patient expects to  be discharged to:: Private residence Living Arrangements: Spouse/significant other Available Help at Discharge: Family, Available 24 hours/day Type of Home: House Home Access: Level entry Home Layout: Two level, Able to live on main level with bedroom/bathroom Alternate Level Stairs-Number of Steps: 10  steps to upper level Alternate Level Stairs-Rails: Right Bathroom Shower/Tub: Multimedia programmer: Handicapped height Bathroom Accessibility: Yes Additional Comments: Daughter lives in house behind pt  Lives With: Spouse IADL History Homemaking Responsibilities: Yes Meal Prep Responsibility: Secondary Laundry Responsibility: Secondary Cleaning Responsibility: Secondary Bill Paying/Finance Responsibility: Secondary Shopping Responsibility: Secondary Current License: Yes Mode of Transportation: Car Occupation: Retired 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 Frequent falls. Shares the housework with her husband. ADL ADL Eating: Set up Where Assessed-Eating: Bed level Grooming: Supervision/safety Where Assessed-Grooming: Edge of bed Upper Body Bathing: Minimal cueing, Minimal assistance Where Assessed-Upper Body Bathing: Edge of bed Lower Body Bathing: Minimal cueing, Maximal assistance Where Assessed-Lower Body Bathing: Edge of bed Upper Body Dressing: Minimal assistance, Minimal cueing Where Assessed-Upper Body Dressing: Edge of bed Lower Body Dressing: Maximal assistance, Moderate cueing Where Assessed-Lower Body Dressing: Edge of bed Toileting: Maximal assistance, Moderate cueing Where Assessed-Toileting: Bedside Commode Toilet Transfer: Moderate assistance Toilet Transfer Method: Stand pivot Toilet Transfer Equipment: Bedside commode Tub/Shower Transfer: Unable to assess Vision Baseline Vision/History: Wears glasses Wears Glasses: At all times Patient Visual Report: No change from baseline Vision Assessment?: No apparent visual deficits Perception  Perception: Within Functional Limits Praxis Praxis:  Intact Cognition Overall Cognitive Status: Within Functional Limits for tasks assessed Arousal/Alertness: Lethargic Orientation Level: Person;Situation;Place Person: Oriented Place: Oriented Situation: Oriented Year: 2019 Month: October Day of Week: Incorrect Memory: Appears intact Memory Impairment: Decreased short term memory Decreased Short Term Memory: Functional complex;Verbal complex Immediate Memory Recall: Sock;Blue;Bed Memory Recall: Blue;Bed Memory Recall Blue: Without Cue Memory Recall Bed: Without Cue Attention: Selective Selective Attention: Appears intact Selective Attention Impairment: Verbal complex Awareness: Appears intact Problem Solving: Appears intact Problem Solving Impairment: Verbal complex;Functional complex Executive Function: Self Monitoring Initiating: Appears intact Safety/Judgment: Appears intact Sensation Sensation Light Touch: Appears Intact Hot/Cold: Appears Intact Coordination Gross Motor Movements are Fluid and Coordinated: No Fine Motor Movements are Fluid and Coordinated: No Coordination and Movement Description: generalized weakness/lethargy Motor  Motor Motor: Other (comment) Motor - Skilled Clinical Observations: generalized weakness Mobility  Bed Mobility Bed Mobility: Rolling Right;Rolling Left;Sit to Supine;Scooting to [facility] Rolling Right: Moderate Assistance - Patient 50-74% Rolling Left: Moderate Assistance - Patient 50-74% Sit to Supine: Moderate Assistance - Patient 50-74% Scooting to HOB: Moderate Assistance - Patient 50-74% Transfers Sit to Stand: Moderate Assistance - Patient 50-74% Stand to Sit: Moderate Assistance - Patient 50-74%  Trunk/Postural Assessment  Cervical Assessment Cervical Assessment: Exceptions to WFL(forward head) Thoracic Assessment Thoracic Assessment: Exceptions to WFL(kyphotic) Lumbar Assessment Lumbar Assessment: Exceptions to WFL(posterior pelvic tilt) Postural Control Postural Control:  Deficits on evaluation Righting Reactions: delayed and inadequate   Balance Balance Balance Assessed: Yes Static Sitting Balance Static Sitting - Balance Support: Feet supported;No upper extremity supported Static Sitting - Level of Assistance: 5: Stand by assistance Dynamic Sitting Balance Dynamic Sitting - Balance Support: Feet supported;No upper extremity supported Dynamic Sitting - Level of Assistance: 4: Min assist Sitting balance - Comments: during dressing Static Standing Balance Static Standing - Balance Support: During functional  activity Static Standing - Level of Assistance: 3: Mod assist Dynamic Standing Balance Dynamic Standing - Balance Support: During functional activity;Bilateral upper extremity supported Dynamic Standing - Level of Assistance: 2: Max assist Dynamic Standing - Balance Activities: Reaching across midline Extremity/Trunk Assessment RUE Assessment RUE Assessment: Exceptions to [facility] General Strength Comments: generalized weakness, 3+/5 LUE Assessment LUE Assessment: Exceptions to [facility] General Strength Comments: generalized weakness, 3+/5     Refer to Care Plan for Long Term Goals  Recommendations for other services: None    Discharge Criteria: Patient will be discharged from OT if patient refuses treatment 3 consecutive times without medical reason, if treatment goals not met, if there is a change in medical status, if patient makes no progress towards goals or if patient is discharged from hospital.  The above assessment, treatment plan, treatment alternatives and goals were discussed and mutually agreed upon: by patient and by family  Curtis Sites 04/16/2018, 3:59 PM

## 2018-04-16 NOTE — Progress Notes (Addendum)
Progress Note  Patient Name: [name] Date of Encounter: 04/16/2018  Primary Cardiologist: Larae Grooms, MD   Subjective   Feeling tired this AM. No chest pain or shortness of breath.   Inpatient Medications    Scheduled Meds: . aspirin  81 mg Oral Daily  . atorvastatin  40 mg Oral q1800  . B-complex with vitamin C  1 tablet Per Tube Daily  . brimonidine  1 drop Left Eye TID  . darbepoetin (ARANESP) injection - DIALYSIS  100 mcg Intravenous Q Thu-HD  . feeding supplement (NEPRO CARB STEADY)  237 mL Oral BID BM  . heparin  5,000 Units Subcutaneous Q8H  . hydrALAZINE  25 mg Oral Q8H  . insulin aspart  2-6 Units Subcutaneous TID WC  . insulin glargine  15 Units Subcutaneous Daily  . latanoprost  1 drop Both Eyes QHS  . ticagrelor  90 mg Oral BID  . timolol  1 drop Left Eye Daily   Continuous Infusions: . [START ON 04/21/2018] ferric gluconate (FERRLECIT/NULECIT) IV     PRN Meds: acetaminophen, calcium carbonate, Gerhardt's butt cream, guaiFENesin-dextromethorphan   Vital Signs    Vitals:   04/15/18 1817 04/15/18 2005 04/15/18 2124 04/16/18 0405  BP:  (!) 150/37  126/67  Pulse:  65 66 78  Resp:  16  18  Temp:  98.2 F (36.8 C)  98 F (36.7 C)  TempSrc:  Oral  Oral  SpO2:  97% 96% 96%  Weight: 73.2 kg   73.3 kg  Height: 5\' 5"  (1.651 m)       Intake/Output Summary (Last 24 hours) at 04/16/2018 1004 Last data filed at 04/16/2018 0837 Gross per 24 hour  Intake 120 ml  Output -  Net 120 ml   Filed Weights   04/15/18 1817 04/16/18 0405  Weight: 73.2 kg 73.3 kg    Telemetry    Patient is off telemetry.   ECG    None performed today. 10/1 EKG with NSR, RBB and TWI V1-V6  - Personally Reviewed  Physical Exam   GEN: somnolent but easily arousable to voice.  Neck: No JVD Cardiac: RRR, no murmurs, rubs, or gallops.  Respiratory: Clear to auscultation bilaterally. GI: Soft, nontender, non-distended  MS: No edema; No deformity. Neuro:  Nonfocal.  Somnolent but able to follow commands. Oriented x3.   Psych: Normal affect   Labs    Chemistry Recent Labs  Lab 04/11/18 0844 04/12/18 0910 04/14/18 0221 04/15/18 0243 04/15/18 1708  NA 135 135 134* 136  --   K 4.0 3.6 4.1 3.3*  --   CL 94* 96* 94* 95*  --   CO2 25 27 19* 27  --   GLUCOSE 115* 115* 138* 95  --   BUN 34* 22 47* 17  --   CREATININE 4.69* 3.37* 5.88* 3.21* 4.43*  CALCIUM 8.9 8.9 9.0 8.7*  --   ALBUMIN 2.2*  --  2.5*  --   --   GFRNONAA 8* 12* 6* 13* 9*  GFRAA 9* 14* 7* 15* 10*  ANIONGAP 16* 12 21* 14  --      Hematology Recent Labs  Lab 04/14/18 0221 04/15/18 0243 04/15/18 1708  WBC 13.1* 9.0 9.8  RBC 3.07* 2.77* 2.75*  HGB 10.3* 9.3* 9.3*  HCT 32.1* 29.5* 29.7*  MCV 104.6* 106.5* 108.0*  MCH 33.6 33.6 33.8  MCHC 32.1 31.5 31.3  RDW 14.3 14.4 14.4  PLT 230 186 208    Cardiac EnzymesNo results for  input(s): TROPONINI in the last 168 hours. No results for input(s): TROPIPOC in the last 168 hours.   BNPNo results for input(s): BNP, PROBNP in the last 168 hours.   DDimer No results for input(s): DDIMER in the last 168 hours.   Radiology    No results found.  Cardiac Studies   Cardiac catheterization 04/08/18:  Ost 1st Mrg lesion is 75% stenosed.  Prox Cx lesion is 100% stenosed. Left to left collaterals. Small AV groove circumflex.  Ost 1st Diag lesion is 100% stenosed. SVG to diag is patent.  Prox LAD lesion is 95% stenosed. LIMA to LAD is patent.  Prox RCA lesion is 95% stenosed. Mid Graft lesion is 95% stenosed in SVG to PLA.  A drug-eluting stent was successfully placed using a STENT RESOLUTE ONYX 2.0X15.  Post intervention, there is a 0% residual stenosis.  LV end diastolic pressure is normal.  There is no aortic valve stenosis.  Calcified femoral artery.  Echocardiogram 03/2018: Study Conclusions  - Left ventricle: The cavity size was normal. Wall thickness was increased in a pattern of mild LVH. Systolic function  was mildly reduced. The estimated ejection fraction was in the range of 45% to 50%. There is akinesis of the basal-midinferolateral and inferior myocardium. Doppler parameters are consistent with abnormal left ventricular relaxation (grade 1 diastolic dysfunction). - Mitral valve: Calcified annulus. There was mild regurgitation.  Impressions:  - Akinesis of the basal/mid inferior and inferolateral walls with overall mildly reduced LV systolic function; mild diastolic dysfunction; mild LVH; mild MR.  Patient Profile     77 y.o. female post cardiac VF arrest, revascularization of SVG to RCA graft on 04/08/2018 with improving encephalopathy, end-stage renal disease on hemodialysis, aspiration pneumonia, diabetes type 2 Telemetry  With some ploymorphic VT during first 48 hours tends toward bradycardia especially at night and with coughing   Assessment & Plan    Ventricular tachycardia nonsustained, torsades - Within first 48 hours of MI and revascularization seen by EP and no indication for AICD/PPM - Off of Celexa since hospitalization.  Avoiding QT prolonging agents. - Magnesium and K nl yesterday  - Patient is DNR   Bradycardic episode 04/10/2018 morning - Had a couple 3-4 second asymptomatic sinus pauses in the setting of cough spell. Suspect vagal episode. Now off bystolic and all beta blockers  - Now in rehab and off telemetry   VF arrest - Encephalopathy continues to improve  - Revascularization SVG to RCA  End-stage renal disease on hemodialysis - Nephrology following    For questions or updates, please contact [facility] Please consult [email or web address] for contact info under      Signed, Welford Roche, MD  04/16/2018, 10:04 AM    Rhythm appears stable off beta blockers Post MI with stent to sVG RCA  Stable at rehab  Jenkins Rouge

## 2018-04-16 NOTE — Progress Notes (Signed)
Physical Therapy Note  Patient Details  Name: [name] MRN: [id number] Date of Birth: 1941-06-07 Today's Date: 04/16/2018    Pt refused PT eval despite multiple attempts and encouragement. Pt states "I'm just giving up". Pt unwilling to participate in any PT treatment.  Will attempt eval as appropriate.   Keijuan Schellhase 04/16/2018, 10:18 AM

## 2018-04-16 NOTE — Plan of Care (Signed)
  Problem: Spiritual Needs Goal: Ability to function at adequate level Outcome: Progressing   Problem: Consults Goal: RH GENERAL PATIENT EDUCATION Description See Patient Education module for education specifics. Outcome: Progressing Goal: Skin Care Protocol Initiated - if Braden Score 18 or less Description If consults are not indicated, leave blank or document N/A Outcome: Progressing Goal: Nutrition Consult-if indicated Outcome: Progressing Goal: Diabetes Guidelines if Diabetic/Glucose > 140 Description If diabetic or lab glucose is > 140 mg/dl - Initiate Diabetes/Hyperglycemia Guidelines & Document Interventions  Outcome: Progressing   Problem: RH BOWEL ELIMINATION Goal: RH STG MANAGE BOWEL WITH ASSISTANCE Description STG Manage Bowel with min Assistance.  Outcome: Progressing Goal: RH STG MANAGE BOWEL W/MEDICATION W/ASSISTANCE Description STG Manage Bowel with Medication with min  Assistance.  Outcome: Progressing   Problem: RH SKIN INTEGRITY Goal: RH STG SKIN FREE OF INFECTION/BREAKDOWN Description Min A  Outcome: Progressing Goal: RH STG MAINTAIN SKIN INTEGRITY WITH ASSISTANCE Description STG Maintain Skin Integrity With min  Assistance.  Outcome: Progressing Goal: RH STG ABLE TO PERFORM INCISION/WOUND CARE W/ASSISTANCE Description STG Able To Perform Incision/Wound Care With min  Assistance.  Outcome: Progressing   Problem: RH SAFETY Goal: RH STG ADHERE TO SAFETY PRECAUTIONS W/ASSISTANCE/DEVICE Description STG Adhere to Safety Precautions With min  Assistance/Device.  Outcome: Progressing   Problem: RH PAIN MANAGEMENT Goal: RH STG PAIN MANAGED AT OR BELOW PT'S PAIN GOAL Outcome: Progressing   Problem: RH KNOWLEDGE DEFICIT GENERAL Goal: RH STG INCREASE KNOWLEDGE OF SELF CARE AFTER HOSPITALIZATION Outcome: Progressing

## 2018-04-16 NOTE — Progress Notes (Signed)
Patient's husband states that patient wanted to change from a full DNR to limited code.  She does want CPR and medications only in case of cardiac arrest.  I have ordered a limited code.   [name] MD [employer] pgr [phone]   04/16/2018, 10:26 AM

## 2018-04-16 NOTE — Progress Notes (Signed)
Patient information reviewed and entered into eRehab system by Reanne Nellums, RN, CRRN, PPS Coordinator.  Information including medical coding, functional ability and quality indicators will be reviewed and updated through discharge.     Per nursing patient was given "Data Collection Information Summary for Patients in Inpatient Rehabilitation Facilities with attached "Privacy Act Statement-Health Care Records" upon admission.  

## 2018-04-17 ENCOUNTER — Inpatient Hospital Stay (HOSPITAL_COMMUNITY): Payer: Medicare Other

## 2018-04-17 ENCOUNTER — Inpatient Hospital Stay (HOSPITAL_COMMUNITY): Payer: Medicare Other | Admitting: Physical Therapy

## 2018-04-17 DIAGNOSIS — A0472 Enterocolitis due to Clostridium difficile, not specified as recurrent: Secondary | ICD-10-CM

## 2018-04-17 LAB — GLUCOSE, CAPILLARY
GLUCOSE-CAPILLARY: 102 mg/dL — AB (ref 70–99)
GLUCOSE-CAPILLARY: 170 mg/dL — AB (ref 70–99)
Glucose-Capillary: 153 mg/dL — ABNORMAL HIGH (ref 70–99)
Glucose-Capillary: 93 mg/dL (ref 70–99)

## 2018-04-17 MED ORDER — SACCHAROMYCES BOULARDII 250 MG PO CAPS
250.0000 mg | ORAL_CAPSULE | Freq: Two times a day (BID) | ORAL | Status: DC
  Administered 2018-04-17 – 2018-04-28 (×23): 250 mg via ORAL
  Filled 2018-04-17 (×23): qty 1

## 2018-04-17 MED ORDER — CHLORHEXIDINE GLUCONATE CLOTH 2 % EX PADS
6.0000 | MEDICATED_PAD | Freq: Every day | CUTANEOUS | Status: DC
  Administered 2018-04-17 – 2018-04-20 (×2): 6 via TOPICAL

## 2018-04-17 NOTE — Evaluation (Signed)
Physical Therapy Assessment and Plan  Patient Details  Name: [name] MRN: [id number] Date of Birth: [birth year]  PT Diagnosis: Abnormal posture, Abnormality of gait, Coordination disorder, Difficulty walking, Low back pain and Muscle weakness Rehab Potential: Good ELOS: 3-4 weeks    Today's Date: 04/17/2018 PT Individual Time: 1100-1206 PT Individual Time Calculation (min): 66 min    Problem List:  Patient Active Problem List   Diagnosis Date Noted  . Poorly controlled type 2 diabetes mellitus with peripheral neuropathy ([place])   . Debility   . Benign essential HTN   . Bradycardia   . Nonsustained ventricular tachycardia ([place])   . Enteritis due to Clostridium difficile   . ESRD on dialysis ([place])   . Diabetic peripheral neuropathy ([place])   . Acute blood loss anemia   . Anemia of chronic disease   . Aspiration into airway   . NSTEMI (non-ST elevated myocardial infarction) ([place])   . QT prolongation 04/03/2018  . Acute respiratory failure with hypoxia ([place])   . Aspiration pneumonia due to gastric secretions ([place])   . Cardiac arrest ([place]) 04/02/2018  . Anxiety 05/13/2017  . Panic attack 05/13/2017  . SAH (subarachnoid hemorrhage) ([place]) 02/03/2017  . Fall 02/03/2017  . Spinal stenosis, lumbar region 03/02/2016  . Trochanteric bursitis of left hip 01/03/2016  . Generalized weakness 12/21/2015  . Acute hypokalemia 12/21/2015  . Lumbar back pain with radiculopathy affecting left lower extremity 12/04/2015  . Coronary artery disease involving native coronary artery of native heart without angina pectoris   . CRI (chronic renal insufficiency), stage 4 (severe) (HCC)   . Urinary retention   . Anemia of chronic kidney failure   . Hx of gout   . Thrombocytopenia ([place])   . S/P lumbar discectomy 12/01/2015  . 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 Acute encephalopathy   . Chronic kidney disease (CKD),  stage V ([place]) 07/13/2015  . Glaucoma   . Atrial fibrillation ([place])   . CAD (coronary artery disease)   . Depression   . Stroke ([place])   . Diabetes mellitus without complication ([place])   . Gastroesophageal reflux disease with esophagitis   . Carotid artery stenosis 04/21/2015  . Essential hypertension 06/08/2014  . Hyperlipidemia 06/08/2014  . Occlusion and stenosis of carotid artery without mention of cerebral infarction 01/01/2012    Past Medical History:  Past Medical History:  Diagnosis Date  . Anemia   . Arthritis   . Atrial fibrillation ([place])   . Blood transfusion   . CAD (coronary artery disease)   . Cancer ([place])    .  top of head- melonoma  . Carotid artery occlusion    Carotid Endartectom,y - left 2009.  Blockage Right being watched by Dr Scot Dock.  . Carotid stenosis   . 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 Complication of anesthesia    pt. states that she was difficult to wake  . Depression   . Diabetes mellitus without complication ([place])   . Dysrhythmia   . General weakness 12/2015  . GERD (gastroesophageal reflux disease)   . Glaucoma   . History of hiatal hernia   . History of kidney stones    passed  . History of pneumonia   . HOH (hard of hearing)   . Hypertension   . Hypokalemia 12/2015  . Myocardial infarction ([place])   . Neuromuscular disorder ([place])    CARPEL TUNNEL  .  Pneumonia   . Shortness of breath   . Stroke ([place])    hx of TIA   Past Surgical History:  Past Surgical History:  Procedure Laterality Date  . A/V FISTULAGRAM N/A 07/21/2017   Procedure: A/V FISTULAGRAM - Left Arm;  Surgeon: Angelia Mould, MD;  Location: [facility];  Service: Cardiovascular;  Laterality: N/A;  . AV FISTULA PLACEMENT Left 05/31/2014   Procedure: Creation of Left Arm arteriovenous brachiocephalic Fistula;  Surgeon: Angelia Mould, MD;  Location: [facility];  Service: Vascular;  Laterality: Left;  . AV FISTULA PLACEMENT Left 09/01/2014    Procedure: INSERTION OF ARTERIOVENOUS (AV) GORE-TEX GRAFT LEFT UPPER ARM USING  4-7 MM X 45 CM SRTETCH GORETEX GRAFT;  Surgeon: Angelia Mould, MD;  Location: [facility];  Service: Vascular;  Laterality: Left;  . BACK SURGERY    . CARDIAC CATHETERIZATION    . CAROTID ENDARTERECTOMY Left 2009    CEA  . carpel tunnel    . COLONOSCOPY  07/25/2011   Procedure: COLONOSCOPY;  Surgeon: Winfield Cunas., MD;  [facility];  Service: Endoscopy;  Laterality: N/A;  . CORONARY ARTERY BYPASS GRAFT  07/29/2011   Procedure: CORONARY ARTERY BYPASS GRAFTING (CABG);  Surgeon: Gaye Pollack, MD;  Location: [facility];  Service: Open Heart Surgery;  Laterality: N/A;  . CORONARY STENT INTERVENTION N/A 04/08/2018   Procedure: CORONARY STENT INTERVENTION;  Surgeon: Jettie Booze, MD;  Location: [facility];  Service: Cardiovascular;  Laterality: N/A;  . ENDARTERECTOMY Right 04/21/2015   Procedure: ENDARTERECTOMY CAROTID;  Surgeon: Angelia Mould, MD;  Location: [facility];  Service: Vascular;  Laterality: Right;  . ESOPHAGOGASTRODUODENOSCOPY  07/25/2011   Procedure: ESOPHAGOGASTRODUODENOSCOPY (EGD);  Surgeon: Winfield Cunas., MD;  [facility];  Service: Endoscopy;  Laterality: N/A;  . EYE SURGERY    . FISTULOGRAM Left 08/29/2014   Procedure: FISTULOGRAM;  Surgeon: Angelia Mould, MD;  Location: [facility] CATH LAB;  Service: Cardiovascular;  Laterality: Left;  . KNEE SURGERY Left   . LEFT HEART CATH AND CORS/GRAFTS ANGIOGRAPHY N/A 04/08/2018   Procedure: LEFT HEART CATH AND CORS/GRAFTS ANGIOGRAPHY;  Surgeon: Jettie Booze, MD;  Location: [place] [facility];  Service: Cardiovascular;  Laterality: N/A;  . LUMBAR LAMINECTOMY/DECOMPRESSION MICRODISCECTOMY N/A 12/01/2015   Procedure: L5-S1 Decompression and Bilateral Microdiscectomy;  Surgeon: Marybelle Killings, MD;  Location: [facility];  Service: Orthopedics;  Laterality: N/A;  . NECK SURGERY    . PERIPHERAL VASCULAR BALLOON  ANGIOPLASTY  07/21/2017   Procedure: PERIPHERAL VASCULAR BALLOON ANGIOPLASTY;  Surgeon: Angelia Mould, MD;  Location: [place] [facility];  Service: Cardiovascular;;  LT Arm AVF  . TONSILLECTOMY      Assessment & Plan Clinical Impression: Donita [place]. [place] is a 77 year old right-handed female history of irritable bowel syndrome, end-stage renal disease with hemodialysis recently initiated approximately 3 to 4 weeks ago, atrial fibrillation, CAD with CABG 2013 maintained on aspirin, left carotid enterectomy, hypertension, diabetes mellitus as well as history of lumbar radiculopathy with L5-S1 microdiscectomy receiving inpatient rehab services May 2017.  Per chart review and family, lives with spouse.  Independent with assistive device prior to admission.  Daughter lives next door.  2 level home with bed and bath on main level.  Presented 04/02/2018 after witnessed out of hospital cardiac arrest.  CPR initiated.  EMS arrived within 10 minutes found in V. fib and intubated in the field.  Noted systolic blood pressure in the 220s.  Troponin 1.17, lactic acid 5.11, WBC 18,900.  Placed on heparin drip.  Echocardiogram with ejection fraction of 50% akinesis of the basal mid inferior lateral and inferior myocardium grade 1 diastolic dysfunction.  Placed on broad-spectrum antibiotics for elevated WBC question aspiration pneumonia.  Cardiology follow-up cardiac catheterization 04/08/2018 that showed proximal LCx lesion 100% stenosis in proximal LAD 95% stenosis.  Patient underwent successful stenting per Dr.Varanasi and maintained on aspirin and Brilinta therapy.  Hemodialysis ongoing as per renal services.  Hospital course positive C. difficile with Flexi-Seal placed contact precautions completing course of vancomycin for C. difficile.  Subcutaneous heparin for DVT prophylaxis.  Patient did remain intubated through 04/04/2018.  Patient with episode of bradycardia 04/10/2018 and remained asymptomatic with follow-up per  cardiology services with EKG showing P-P prolongation noted while coughing and also episode while sleeping. Beta-blocker was discontinued.   Therapy evaluations completed with recommendations of physical medicine rehab consult.  Patient was admitted for a comprehensive rehab program.  Patient transferred to CIR on 04/15/2018 .   Patient currently requires mod with mobility secondary to muscle weakness, decreased cardiorespiratoy endurance, decreased coordination and decreased motor planning and decreased sitting balance, decreased standing balance, decreased postural control and decreased balance strategies.  Prior to hospitalization, patient was supervision with mobility and lived with Spouse in a House home.  Home access is  Level entry.  Patient will benefit from skilled PT intervention to maximize safe functional mobility, minimize fall risk and decrease caregiver burden for planned discharge home with 24 hour assist.  Anticipate patient will benefit from follow up [facility] at discharge.  PT - End of Session Activity Tolerance: Tolerates 30+ min activity with multiple rests Endurance Deficit: Yes Endurance Deficit Description: generalized weakness/deconditioning  PT Assessment Rehab Potential (ACUTE/IP ONLY): Good PT Patient demonstrates impairments in the following area(s): Balance;Endurance;Motor;Safety PT Transfers Functional Problem(s): Bed Mobility;Bed to Chair;Car;Furniture;Floor PT Locomotion Functional Problem(s): Ambulation;Wheelchair Mobility;Stairs PT Plan PT Intensity: Minimum of 1-2 x/day ,45 to 90 minutes PT Frequency: 5 out of 7 days PT Duration Estimated Length of Stay: 3-4 weeks  PT Treatment/Interventions: Ambulation/gait training;Community reintegration;DME/adaptive equipment instruction;Neuromuscular re-education;Psychosocial support;Stair training;UE/LE Strength taining/ROM;Wheelchair propulsion/positioning;Balance/vestibular training;Discharge planning;Therapeutic  Activities;UE/LE Coordination activities;Functional mobility training;Patient/family education;Therapeutic Exercise PT Transfers Anticipated Outcome(s): MinA with LRAD  PT Locomotion Anticipated Outcome(s): MinA with LRAD  PT Recommendation Recommendations for Other Services: Neuropsych consult;Therapeutic Recreation consult Therapeutic Recreation Interventions: Kitchen group;Pet therapy;Stress management Follow Up Recommendations: Home health PT Patient destination: Home Equipment Recommended: 3 in 1 bedside comode;Tub/shower bench  Skilled Therapeutic Intervention Patient received in bed with family present, fatigued but willing to participate in PT today with family encouragement. Able to complete rolling with min guard, however required ModA for all other functional mobility today, including functional transfers and stand-pivot to [facility] with RW. Very fatigued today and required multiple extended rest breaks and high levels of encouragement from PT and family to participate. Attempted gait however patient became very dizzy, BP found to be 96/37 and confirmed to be low by RN on manual cuff. Returned patient back to bed with [place] stand pivot transfer. Session severe limited by fatigue and symptomatic hypotension today. She was left in bed with all needs met, alarm activated, family present. Education provided regarding PT recommendations in terms of length of stay and expectations for ongoing participation moving forward, patient agreeable and very fatigued at EOS.   PT Evaluation Precautions/Restrictions Precautions Precautions: Fall Precaution Comments: chest soreness from CPR, watch vitals  Restrictions Weight Bearing Restrictions: No General Chart Reviewed: Yes  Response to Previous Treatment: Patient reporting fatigue but able to participate. Family/Caregiver Present: Yes Vital SignsTherapy Vitals Pulse Rate: 61 BP: (!) 98/38(PA notified) Patient Position (if appropriate):  Sitting Pain Pain Assessment Pain Scale: 0-10 Pain Score: 6  Pain Type: Acute pain Pain Location: Chest Pain Orientation: Medial;Anterior Pain Descriptors / Indicators: Aching;Constant;Discomfort Pain Onset: On-going Patients Stated Pain Goal: 0 Pain Intervention(s): Repositioned;Ambulation/increased activity Home Living/Prior Functioning Home Living Available Help at Discharge: Family;Available 24 hours/day Type of Home: House Home Access: Level entry Home Layout: Two level;Able to live on main level with bedroom/bathroom Alternate Level Stairs-Number of Steps: 10 steps to upper level Alternate Level Stairs-Rails: Right Bathroom Shower/Tub: Multimedia programmer: Handicapped height Bathroom Accessibility: Yes Additional Comments: Daughter lives in house behind pt  Lives With: Spouse Prior Function Level of Independence: Independent with basic ADLs;Requires assistive device for independence  Able to Take Stairs?: Yes Driving: (rarely ) Vocation: Retired Comments: Uses rolling walker or cane. Frequent falls. Shares the housework with her husband. Vision/Perception  Perception Perception: Within Functional Limits Praxis Praxis: Intact  Cognition Overall Cognitive Status: Within Functional Limits for tasks assessed Arousal/Alertness: Awake/alert Orientation Level: Oriented X4 Attention: Selective Selective Attention: Appears intact Selective Attention Impairment: Verbal complex;Functional complex Memory: Appears intact Memory Impairment: Decreased short term memory Decreased Short Term Memory: Functional complex;Verbal complex Awareness: Appears intact Problem Solving: Appears intact Problem Solving Impairment: Verbal complex;Functional complex Executive Function: Self Monitoring Initiating: Appears intact Self Monitoring: Impaired Self Monitoring Impairment: Verbal complex;Functional complex Behaviors: Poor frustration tolerance Safety/Judgment: Appears  intact Sensation Sensation Light Touch: Appears Intact Hot/Cold: Appears Intact Proprioception: Appears Intact Stereognosis: Appears Intact Coordination Gross Motor Movements are Fluid and Coordinated: No Fine Motor Movements are Fluid and Coordinated: No Coordination and Movement Description: weakness  Motor  Motor Motor: Other (comment) Motor - Skilled Clinical Observations: generalized weakness  Mobility Bed Mobility Bed Mobility: Rolling Right;Rolling Left;Sit to Supine;Supine to Sit Rolling Right: Supervision/verbal cueing Rolling Left: Supervision/Verbal cueing Supine to Sit: Moderate Assistance - Patient 50-74% Sit to Supine: Moderate Assistance - Patient 50-74% Transfers Transfers: Sit to Stand;Stand Pivot Transfers Sit to Stand: Moderate Assistance - Patient 50-74% Stand to Sit: Moderate Assistance - Patient 50-74% Stand Pivot Transfers: Moderate Assistance - Patient 50 - 74% Stand Pivot Transfer Details: Verbal cues for precautions/safety;Verbal cues for technique;Verbal cues for sequencing;Tactile cues for weight beaing;Tactile cues for posture;Tactile cues for weight shifting Transfer (Assistive device): Rolling walker Locomotion  Gait Ambulation: No Gait Gait: No Gait velocity: refused gait, fatigued  Stairs / Additional Locomotion Stairs: No Wheelchair Mobility Wheelchair Mobility: No  Trunk/Postural Assessment  Cervical Assessment Cervical Assessment: Exceptions to WFL(forward head ) Thoracic Assessment Thoracic Assessment: Exceptions to WFL(kyphotic ) Lumbar Assessment Lumbar Assessment: Exceptions to WFL(posterior pelvic tilt ) Postural Control Postural Control: Deficits on evaluation Righting Reactions: delayed and inadequate   Balance Balance Balance Assessed: Yes Static Sitting Balance Static Sitting - Balance Support: Feet supported;No upper extremity supported Static Sitting - Level of Assistance: 5: Stand by assistance Dynamic Sitting  Balance Dynamic Sitting - Balance Support: Feet supported;No upper extremity supported Dynamic Sitting - Level of Assistance: 4: Min Insurance risk surveyor Standing - Balance Support: During functional activity Static Standing - Level of Assistance: 4: Min assist Dynamic Standing Balance Dynamic Standing - Balance Support: During functional activity;Bilateral upper extremity supported Dynamic Standing - Level of Assistance: 3: Mod assist Extremity Assessment  RUE Assessment RUE Assessment: Not tested LUE Assessment LUE Assessment: Not tested RLE Assessment RLE Assessment: Exceptions  to [place] Strength Comments: grossly 3-/5  LLE Assessment LLE Assessment: Exceptions to [facility] Strength Comments: grossly 3-/5     Refer to Care Plan for Long Term Goals  Recommendations for other services: Neuropsych  Discharge Criteria: Patient will be discharged from PT if patient refuses treatment 3 consecutive times without medical reason, if treatment goals not met, if there is a change in medical status, if patient makes no progress towards goals or if patient is discharged from hospital.  The above assessment, treatment plan, treatment alternatives and goals were discussed and mutually agreed upon: by patient and by family  Deniece Ree PT, DPT, [place]    Pager [phone] Acute Rehab Office [phone]

## 2018-04-17 NOTE — Progress Notes (Signed)
[facility] PHYSICAL MEDICINE & REHABILITATION     PROGRESS NOTE  Subjective/Complaints:  Lying in bed. Fatigued from ongoing diarrhea. Was able to sleep some last night. No appetite to eat however.   ROS: Patient denies fever, rash, sore throat, blurred vision,   cough, shortness of breath or chest pain, joint or back pain, headache, or mood change.   Objective: Vital Signs: Blood pressure (!) 152/37, pulse 73, temperature (!) 97.5 F (36.4 C), temperature source Oral, resp. rate 12, height 5\' 5"  (1.651 m), weight 75.3 kg, SpO2 100 %. No results found. Recent Labs    04/15/18 1708 04/16/18 1409  WBC 9.8 10.5  HGB 9.3* 8.9*  HCT 29.7* 28.4*  PLT 208 212   Recent Labs    04/15/18 0243 04/15/18 1708 04/16/18 1409  NA 136  --  135  K 3.3*  --  3.5  CL 95*  --  95*  GLUCOSE 95  --  226*  BUN 17  --  41*  CREATININE 3.21* 4.43* 5.46*  CALCIUM 8.7*  --  8.7*   CBG (last 3)  Recent Labs    04/16/18 1833 04/16/18 2145 04/17/18 0646  GLUCAP 102* 129* 102*    Wt Readings from Last 3 Encounters:  04/17/18 75.3 kg  04/15/18 72.8 kg  01/28/18 77.1 kg    Physical Exam:  BP (!) 152/37 (BP Location: Right Arm)   Pulse 73   Temp (!) 97.5 F (36.4 C) (Oral)   Resp 12   Ht 5\' 5"  (1.651 m)   Wt 75.3 kg   SpO2 100%   BMI 27.62 kg/m  Constitutional: No distress . Vital signs reviewed. HEENT: EOMI, oral membranes moist Neck: supple Cardiovascular: RRR without murmur. No JVD    Respiratory: CTA Bilaterally without wheezes or rales. Normal effort    GI: BS +, non-tender, non-distended   Musculoskeletal: Generalized edema  Neurological: She is alert.  A&Ox3 with cues. Some delays in processing Motor: 3+ to 4/5 in all 4's. No sensory change Skin: Skin is warm and dry. Buttocks/peri-anal area with minimal erythema  Psychiatric: flat, cooperative   Assessment/Plan: 1. Functional deficits secondary to debility which require 3+ hours per day of interdisciplinary therapy in  a comprehensive inpatient rehab setting. Physiatrist is providing close team supervision and 24 hour management of active medical problems listed below. Physiatrist and rehab team continue to assess barriers to discharge/monitor patient progress toward functional and medical goals.  Function:  Bathing Bathing position      Bathing parts      Bathing assist        Upper Body Dressing/Undressing Upper body dressing                    Upper body assist        Lower Body Dressing/Undressing Lower body dressing                                  Lower body assist        Toileting Toileting          Toileting assist     Transfers Chair/bed transfer             Locomotion Ambulation           Wheelchair          Cognition Comprehension    Expression    Social Interaction    Problem  Solving    Memory      Medical Problem List and Plan: 1.  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  DVT Prophylaxis/Anticoagulation: Subcutaneous heparin.  Monitor for any bleeding episodes 3. Pain Management: Tylenol as needed 4. Mood: Provide emotional support.  Off of Celexa since hospitalization.  Avoiding QT prolonging agents 5. Neuropsych: This patient is ?fully capable of making decisions on her own behalf. 6. Skin/Wound Care: Routine skin checks  -local care to buttocks to keep clean/dry given diarrhea 7. Fluids/Electrolytes/Nutrition: Routine in and outs with follow-up chemistries 8.  Hypertension.  Hydralazine 25 mg every 8 hours  Monitor with increased mobility 9.  Nonsustained V. Tach/bradycardia.  Patient is asymptomatic.  Avoid QT prolonging agents.  Follow-up per cardiology services.  Patient is DNR 10.  C. difficile.  Flexi-Seal discontinued.  Contact precautions.    -Vancomycin completed  Pt with ongoing mushy/soft stools  -add florastor  -encourage PO  intake 11.  End-stage renal disease with hemodialysis.  Hemodialysis recently initiated 3 to 4 weeks ago.    -HD in afternoons after therapy  -electrolyte/volume mgt per nephrology 12.  Diabetes mellitus with peripheral neuropathy.  Lantus insulin 15 units daily.  Check blood sugars before meals and at bedtime  -cbg's labile given inconsistent/poor intake  -continue covg with SSI before making any standing changes 13.  History of CAD with CABG 2013.  Continue aspirin 14.  Hyperlipidemia.  Lipitor 15.  Acute on chronic anemia.  Continue Aranesp  Hemoglobin 9.3 on 10/2  Recheck with HD  LOS (Days) 2 A FACE TO FACE EVALUATION WAS PERFORMED  Meredith Staggers 04/17/2018 9:54 AM

## 2018-04-17 NOTE — Progress Notes (Signed)
   04/17/18 1500  Clinical Encounter Type  Visited With Patient and family together;Health care provider  Visit Type Follow-up;Spiritual support;Social support;Other (Comment) (AD consult request)  Referral From Nurse  Spiritual Encounters  Spiritual Needs Emotional  Stress Factors  Patient Stress Factors Exhausted   Responded to [facility] for new/updated AD.  Had previously met w/ this family when pt initially arrived through ED.  Spoke w/ pt, who was tired from PT and misses her cat.  She talked about her love for God and her family.  Expressed gratitude for chaplain visit.  She also noted her hearing difficulty, but with increased volume and diction from chaplain, she was able to hear.  Per pt's daughter, pt already did AD a year ago and daughter provided copy to hospital today to be entered into her chart.  Chaplain remains available for support.  Myra Gianotti resident, [phone]

## 2018-04-17 NOTE — Progress Notes (Addendum)
Occupational Therapy Session Note  Patient Details  Name: [name] MRN: [id number] Date of Birth: Jun 08, 1941  Today's Date: 04/17/2018 OT Individual Time: 2536-6440 Session 2: 3474-2595 OT Individual Time Calculation (min): 56 min Session 2: 81 min   Short Term Goals: Week 1:  OT Short Term Goal 1 (Week 1): Pt will don shirt EOB with CGA OT Short Term Goal 2 (Week 1): Pt will transfer to [facility] with min A OT Short Term Goal 3 (Week 1): Pt will don pants with mod A  OT Short Term Goal 4 (Week 1): Pt will complete peri hygiene following toileting with mod A  Skilled Therapeutic Interventions/Progress Updates:    Session focused on b/d tasks and functional activity tolerance. Pt completed stand pivot transfer to w/c with mod A. Pt transferred onto drop arm BSC in shower with min A. Pt completed UB bathing with set up. Mod A provided for LB bathing sitting. No c/o fatigue or SOB throughout. Pt stood in shower with cueing for safety and holding onto grab bars anteriorly. Pt required manual A for posterior peri bathing in standing- pt still experiencing very frequent BMs with blood present and visible hemorrhoids that are very painful, RN aware. Pt transferred back to w/c with min A and returned to bed with all needs met.   Session 2: Session focused on building therapeutic rapport, functional activity tolerance, and toileting tasks. Orthostatic BP taken supine and sitting, with results as documented below. Pt completed bed mobility with min A to w/c. Reports of pain in L axilla throughout session, RN aware. Pt was set up to perform grooming tasks at sink, with vc for visual attention to mirror. Pt was transported outside with granddaughter present to increase arousal and spirits d/t poor mood/self efficacy. Pt returned inside and completed standing level IADL task, crossing midline to retrieve pillow cases and placing in dresser. Pt required min A standing assistance and mod A power up from w/c. Pt  c/o BM incontinence in brief and was returned to room, with brief change performed at standing level with max A. Pt was left sitting up in w/c with family present and all needs met. Pt required frequent extended rest breaks throughout session.   Therapy Documentation Precautions:  Precautions Precautions: Fall Restrictions Weight Bearing Restrictions: Yes(fistula on RUE) Pain: Pain Assessment Pain Scale: Faces Faces Pain Scale: Hurts little more Pain Type: Acute pain Pain Location: Rectum Pain Descriptors / Indicators: Burning;Constant Pain Onset: On-going Patients Stated Pain Goal: 0 Pain Intervention(s): Medication (See eMAR)(tylenol given)   Therapy/Group: Individual Therapy  Curtis Sites 04/17/2018, 9:57 AM

## 2018-04-17 NOTE — Progress Notes (Signed)
Social Work  Social Work Assessment and Plan  Patient Details  Name: [name] MRN: [id number] Date of Birth: 02-17-1941  Today's Date: 04/17/2018  Problem List:  Patient Active Problem List   Diagnosis Date Noted  . Hemorrhoids   . Diabetes mellitus type 2 in nonobese (HCC)   . Anemia, chronic disease   . Labile blood pressure   . Poorly controlled type 2 diabetes mellitus with peripheral neuropathy ([place])   . Debility   . Benign essential HTN   . Bradycardia   . Nonsustained ventricular tachycardia ([place])   . Enteritis due to Clostridium difficile   . ESRD on dialysis ([place])   . Diabetic peripheral neuropathy ([place])   . Acute blood loss anemia   . Anemia of chronic disease   . Aspiration into airway   . NSTEMI (non-ST elevated myocardial infarction) ([place])   . QT prolongation 04/03/2018  . Acute respiratory failure with hypoxia ([place])   . Aspiration pneumonia due to gastric secretions ([place])   . Cardiac arrest ([place]) 04/02/2018  . Anxiety 05/13/2017  . Panic attack 05/13/2017  . SAH (subarachnoid hemorrhage) ([place]) 02/03/2017  . Fall 02/03/2017  . Spinal stenosis, lumbar region 03/02/2016  . Trochanteric bursitis of left hip 01/03/2016  . Generalized weakness 12/21/2015  . Acute hypokalemia 12/21/2015  . Lumbar back pain with radiculopathy affecting left lower extremity 12/04/2015  . Coronary artery disease involving native coronary artery of native heart without angina pectoris   . CRI (chronic renal insufficiency), stage 4 (severe) (HCC)   . Urinary retention   . Anemia of chronic kidney failure   . Hx of gout   . Thrombocytopenia ([place])   . S/P lumbar discectomy 12/01/2015  . 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 Acute encephalopathy   . Chronic kidney disease (CKD), stage V ([place]) 07/13/2015  . Glaucoma   . Atrial fibrillation ([place])   . CAD (coronary artery disease)   . Depression   .  Stroke ([place])   . Diabetes mellitus without complication ([place])   . Gastroesophageal reflux disease with esophagitis   . Carotid artery stenosis 04/21/2015  . Essential hypertension 06/08/2014  . Hyperlipidemia 06/08/2014  . Occlusion and stenosis of carotid artery without mention of cerebral infarction 01/01/2012   Past Medical History:  Past Medical History:  Diagnosis Date  . Anemia   . Arthritis   . Atrial fibrillation ([place])   . Blood transfusion   . CAD (coronary artery disease)   . Cancer ([place])    .  top of head- melonoma  . Carotid artery occlusion    Carotid Endartectom,y - left 2009.  Blockage Right being watched by Dr Scot Dock.  . Carotid stenosis   . 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 Complication of anesthesia    pt. states that she was difficult to wake  . Depression   . Diabetes mellitus without complication ([place])   . Dysrhythmia   . General weakness 12/2015  . GERD (gastroesophageal reflux disease)   . Glaucoma   . History of hiatal hernia   . History of kidney stones    passed  . History of pneumonia   . HOH (hard of hearing)   . Hypertension   . Hypokalemia 12/2015  . Myocardial infarction ([place])   . Neuromuscular disorder ([place])    CARPEL TUNNEL  . Pneumonia   . Shortness of breath   .  Stroke ([place])    hx of TIA   Past Surgical History:  Past Surgical History:  Procedure Laterality Date  . A/V FISTULAGRAM N/A 07/21/2017   Procedure: A/V FISTULAGRAM - Left Arm;  Surgeon: Angelia Mould, MD;  Location: [facility];  Service: Cardiovascular;  Laterality: N/A;  . AV FISTULA PLACEMENT Left 05/31/2014   Procedure: Creation of Left Arm arteriovenous brachiocephalic Fistula;  Surgeon: Angelia Mould, MD;  Location: [facility];  Service: Vascular;  Laterality: Left;  . AV FISTULA PLACEMENT Left 09/01/2014   Procedure: INSERTION OF ARTERIOVENOUS (AV) GORE-TEX GRAFT LEFT UPPER ARM USING  4-7 MM X 45 CM SRTETCH GORETEX GRAFT;   Surgeon: Angelia Mould, MD;  Location: [facility];  Service: Vascular;  Laterality: Left;  . BACK SURGERY    . CARDIAC CATHETERIZATION    . CAROTID ENDARTERECTOMY Left 2009    CEA  . carpel tunnel    . COLONOSCOPY  07/25/2011   Procedure: COLONOSCOPY;  Surgeon: Winfield Cunas., MD;  Location: [facility] ENDOSCOPY;  Service: Endoscopy;  Laterality: N/A;  . CORONARY ARTERY BYPASS GRAFT  07/29/2011   Procedure: CORONARY ARTERY BYPASS GRAFTING (CABG);  Surgeon: Gaye Pollack, MD;  Location: [facility];  Service: Open Heart Surgery;  Laterality: N/A;  . CORONARY STENT INTERVENTION N/A 04/08/2018   Procedure: CORONARY STENT INTERVENTION;  Surgeon: Jettie Booze, MD;  Location: [facility] CV LAB;  Service: Cardiovascular;  Laterality: N/A;  . ENDARTERECTOMY Right 04/21/2015   Procedure: ENDARTERECTOMY CAROTID;  Surgeon: Angelia Mould, MD;  Location: [facility];  Service: Vascular;  Laterality: Right;  . ESOPHAGOGASTRODUODENOSCOPY  07/25/2011   Procedure: ESOPHAGOGASTRODUODENOSCOPY (EGD);  Surgeon: Winfield Cunas., MD;  Location[facility] ENDOSCOPY;  Service: Endoscopy;  Laterality: N/A;  . EYE SURGERY    . FISTULOGRAM Left 08/29/2014   Procedure: FISTULOGRAM;  Surgeon: Angelia Mould, MD;  Location: [facility] CATH LAB;  Service: Cardiovascular;  Laterality: Left;  . KNEE SURGERY Left   . LEFT HEART CATH AND CORS/GRAFTS ANGIOGRAPHY N/A 04/08/2018   Procedure: LEFT HEART CATH AND CORS/GRAFTS ANGIOGRAPHY;  Surgeon: Jettie Booze, MD;  Location: [place] [facility] CV LAB;  Service: Cardiovascular;  Laterality: N/A;  . LUMBAR LAMINECTOMY/DECOMPRESSION MICRODISCECTOMY N/A 12/01/2015   Procedure: L5-S1 Decompression and Bilateral Microdiscectomy;  Surgeon: Marybelle Killings, MD;  Location: [facility];  Service: Orthopedics;  Laterality: N/A;  . NECK SURGERY    . PERIPHERAL VASCULAR BALLOON ANGIOPLASTY  07/21/2017   Procedure: PERIPHERAL VASCULAR BALLOON ANGIOPLASTY;  Surgeon: Angelia Mould, MD;  Location: [facility] CV LAB;  Service: Cardiovascular;;  LT Arm AVF  . TONSILLECTOMY     Social History:  reports that she has never smoked. She has never used smokeless tobacco. She reports that she does not drink alcohol or use drugs.  Family / Support Systems Marital Status: Married Patient Roles: Spouse, Parent, Other (Comment)(grandparent) Spouse/Significant Other: spouse, [name] @ (H) [phone] or [phone] Children: daughter, [name] (lives behind pt) @ (H) [phone] and grandaughter Anticipated Caregiver: sposue, daughter and grand daughters Ability/Limitations of Caregiver: spouse has a bad hip, daughter lives in home behind her parents Caregiver Availability: 24/7 Family Dynamics: Family very supportive - at bedside and encouraging her.  Pt notes they are able to assist at d/c.  Social History Preferred language: English Religion: Methodist Cultural Background: NA Read: Yes Write: Yes Employment Status: Retired Freight forwarder Issues: None Guardian/Conservator: None - per MD, pt is capable of making  decisions on her own behalf.   Abuse/Neglect Abuse/Neglect Assessment Can Be Completed: Yes Physical Abuse: Denies Verbal Abuse: Denies Sexual Abuse: Denies Exploitation of patient/patient's resources: Denies Self-Neglect: Denies  Emotional Status Pt's affect, behavior adn adjustment status: Pt lying in bed and appears very fatigued.  She remembers me from her prior CIR stay in 2017.  She admits she is frustrated with her situation but "grateful" she survived her cardiac arrest!  Her family is at bedside and very encouraging.  Pt denies any significant emotional distress, however, will monitor and refer for neuropsychology as indicated. Recent Psychosocial Issues: New to HD x ~ 1 month Pyschiatric History: None Substance Abuse History: None  Patient / Family Perceptions, Expectations & Goals Pt/Family understanding of illness & functional  limitations: Pt and family with good, basic understanding of her current level of debility following cardiac arrest and VDRF.  Good understanding of her current functional limitations/ need for CIR again. Premorbid pt/family roles/activities: Pt was independent overall PTA Anticipated changes in roles/activities/participation: per goals of min assist, spouse and family will need to provide an increased level of support. Pt/family expectations/goals: "I just want to get stronger."  [employer]: Other (Comment)(HD at [facility] location) Premorbid Home Care/DME Agencies: Other (Comment)([facility] in the past) Transportation available at discharge: yes Resource referrals recommended: Neuropsychology  Discharge Planning Living Arrangements: Spouse/significant other Support Systems: Spouse/significant other, Children, Other relatives, Friends/neighbors Type of Residence: Private residence Insurance Resources: [employer], Multimedia programmer (specify)(BCBS) Financial Resources: Radio broadcast assistant Screen Referred: No Living Expenses: Own Money Management: Spouse Does the patient have any problems obtaining your medications?: No Home Management: pt and spouse Patient/Family Preliminary Plans: Pt to d/c home with spouse as primary support.  Family living close by and can also assist quite a bit. Social Work Anticipated Follow Up Needs: HH/OP Expected length of stay: 21-25 days  Clinical Impression Very pleasant woman, familiar to this SW from prior CIR stay in 2017 and now returns with debility following cardiac arrest and VDRF.  Family very supportive and prepared to provide 24/7 assistance.  Pt hopeful she will regain her strength, however, admits she is discouraged by current level of weakness.  May benefit from neuropsychology involvement. SW to follow for support and d/c planning needs.  Earley Grobe 04/17/2018, 4:29 PM

## 2018-04-17 NOTE — Plan of Care (Signed)
  Problem: Spiritual Needs Goal: Ability to function at adequate level Outcome: Progressing   Problem: Consults Goal: RH GENERAL PATIENT EDUCATION Description See Patient Education module for education specifics. Outcome: Progressing Goal: Skin Care Protocol Initiated - if Braden Score 18 or less Description If consults are not indicated, leave blank or document N/A Outcome: Progressing Goal: Nutrition Consult-if indicated Outcome: Progressing Goal: Diabetes Guidelines if Diabetic/Glucose > 140 Description If diabetic or lab glucose is > 140 mg/dl - Initiate Diabetes/Hyperglycemia Guidelines & Document Interventions  Outcome: Progressing   Problem: RH BOWEL ELIMINATION Goal: RH STG MANAGE BOWEL WITH ASSISTANCE Description STG Manage Bowel with min Assistance.  Outcome: Progressing Goal: RH STG MANAGE BOWEL W/MEDICATION W/ASSISTANCE Description STG Manage Bowel with Medication with min  Assistance.  Outcome: Progressing   Problem: RH SKIN INTEGRITY Goal: RH STG SKIN FREE OF INFECTION/BREAKDOWN Description Min A  Outcome: Progressing Goal: RH STG MAINTAIN SKIN INTEGRITY WITH ASSISTANCE Description STG Maintain Skin Integrity With min  Assistance.  Outcome: Progressing Goal: RH STG ABLE TO PERFORM INCISION/WOUND CARE W/ASSISTANCE Description STG Able To Perform Incision/Wound Care With min  Assistance.  Outcome: Progressing   Problem: RH SAFETY Goal: RH STG ADHERE TO SAFETY PRECAUTIONS W/ASSISTANCE/DEVICE Description STG Adhere to Safety Precautions With min  Assistance/Device.  Outcome: Progressing   Problem: RH PAIN MANAGEMENT Goal: RH STG PAIN MANAGED AT OR BELOW PT'S PAIN GOAL Outcome: Progressing   Problem: RH KNOWLEDGE DEFICIT GENERAL Goal: RH STG INCREASE KNOWLEDGE OF SELF CARE AFTER HOSPITALIZATION Outcome: Progressing

## 2018-04-17 NOTE — IPOC Note (Signed)
Overall Plan of Care [facility]) Patient Details Name: [name] MRN: [id number] DOB: Jun 03, 1941  Admitting Diagnosis: <principal problem not specified>  Hospital Problems: Active Problems:   Debility   Poorly controlled type 2 diabetes mellitus with peripheral neuropathy ([place])     Functional Problem List: Nursing Bowel, Endurance, Medication Management, Safety, Pain, Skin Integrity  PT Balance, Endurance, Motor, Safety  OT Balance, Endurance, Motor, Safety  SLP    TR         Basic ADL's: OT Eating, Grooming, Bathing, Dressing, Toileting     Advanced  ADL's: OT       Transfers: PT Bed Mobility, Bed to Chair, Car, Furniture, Futures trader, Metallurgist: PT Ambulation, Emergency planning/management officer, Stairs     Additional Impairments: OT None  SLP None      TR      Anticipated Outcomes Item Anticipated Outcome  Self Feeding mod I  Swallowing      Basic self-care  (S)  Toileting  (S)   Bathroom Transfers (S)  Bowel/Bladder  anuric on dialysis, mod A with bowel  Transfers  MinA with LRAD   Locomotion  MinA with LRAD   Communication     Cognition     Pain  pain <2  Safety/Judgment  high fall risk   Therapy Plan: PT Intensity: Minimum of 1-2 x/day ,45 to 90 minutes PT Frequency: 5 out of 7 days PT Duration Estimated Length of Stay: 3-4 weeks  OT Intensity: Minimum of 1-2 x/day, 45 to 90 minutes OT Frequency: 5 out of 7 days OT Duration/Estimated Length of Stay: 21-25 days      Team Interventions: Nursing Interventions Patient/Family Education, Pain Management, Medication Management, Bowel Management, Skin Care/Wound Management, Psychosocial Support, Disease Management/Prevention  PT interventions Ambulation/gait training, Community reintegration, DME/adaptive equipment instruction, Neuromuscular re-education, Psychosocial support, Stair training, UE/LE Strength taining/ROM, Wheelchair propulsion/positioning, Training and development officer,  Discharge planning, Therapeutic Activities, UE/LE Coordination activities, Functional mobility training, Patient/family education, Therapeutic Exercise  OT Interventions Training and development officer, Community reintegration, Disease mangement/prevention, Barrister's clerk education, Self Care/advanced ADL retraining, Therapeutic Exercise, UE/LE Coordination activities, Discharge planning, Cognitive remediation/compensation, DME/adaptive equipment instruction, Functional mobility training, Pain management, Psychosocial support, Skin care/wound managment, Therapeutic Activities, UE/LE Strength taining/ROM  SLP Interventions    TR Interventions    SW/CM Interventions Discharge Planning, Psychosocial Support, Patient/Family Education   Barriers to Discharge MD  Medical stability  Nursing Hemodialysis, Medical stability pt had v-fib arrest prior to admit  PT      OT      SLP      SW       Team Discharge Planning: Destination: PT-Home ,OT- Home , SLP-Home Projected Follow-up: PT-Home health PT, OT-  Home health OT, SLP-None Projected Equipment Needs: PT-3 in 1 bedside comode, Tub/shower bench, OT- To be determined, SLP-None recommended by SLP Equipment Details: PT- , OT-  Patient/family involved in discharge planning: PT- Patient,  OT-Patient, Family member/caregiver, SLP-Patient  MD ELOS: 21-25 days Medical Rehab Prognosis:  Good Assessment: The patient has been admitted for CIR therapies with the diagnosis of debility. The team will be addressing functional mobility, strength, stamina, balance, safety, adaptive techniques and equipment, self-care, bowel and bladder mgt, patient and caregiver education, nutrition, pain control, community reentry, ego support. Goals have been set at supervision for self-care and min assist for basic transfers and mobility.    Meredith Staggers, MD, [facility]      See Team Conference Notes for weekly updates to the  plan of care

## 2018-04-17 NOTE — Progress Notes (Signed)
Pt stated she was feeling dizzy and reported increased fatigue. Manual BP 98/38. PA made aware. No new orders at this time. Continue plan of care.  [name]

## 2018-04-18 ENCOUNTER — Other Ambulatory Visit: Payer: Self-pay

## 2018-04-18 DIAGNOSIS — E119 Type 2 diabetes mellitus without complications: Secondary | ICD-10-CM

## 2018-04-18 DIAGNOSIS — D638 Anemia in other chronic diseases classified elsewhere: Secondary | ICD-10-CM

## 2018-04-18 DIAGNOSIS — R0989 Other specified symptoms and signs involving the circulatory and respiratory systems: Secondary | ICD-10-CM

## 2018-04-18 LAB — RENAL FUNCTION PANEL
Albumin: 2.2 g/dL — ABNORMAL LOW (ref 3.5–5.0)
Anion gap: 12 (ref 5–15)
BUN: 30 mg/dL — AB (ref 8–23)
CALCIUM: 9 mg/dL (ref 8.9–10.3)
CHLORIDE: 95 mmol/L — AB (ref 98–111)
CO2: 27 mmol/L (ref 22–32)
CREATININE: 4.55 mg/dL — AB (ref 0.44–1.00)
GFR calc Af Amer: 10 mL/min — ABNORMAL LOW (ref >60)
GFR calc non Af Amer: 8 mL/min — ABNORMAL LOW (ref >60)
GLUCOSE: 231 mg/dL — AB (ref 70–99)
Phosphorus: 4.9 mg/dL — ABNORMAL HIGH (ref 2.5–4.6)
Potassium: 4.3 mmol/L (ref 3.5–5.1)
SODIUM: 134 mmol/L — AB (ref 135–145)

## 2018-04-18 LAB — CBC
HCT: 26.2 % — ABNORMAL LOW (ref 36.0–46.0)
Hemoglobin: 8 g/dL — ABNORMAL LOW (ref 12.0–15.0)
MCH: 33.5 pg (ref 26.0–34.0)
MCHC: 30.5 g/dL (ref 30.0–36.0)
MCV: 109.6 fL — AB (ref 78.0–100.0)
PLATELETS: 168 10*3/uL (ref 150–400)
RBC: 2.39 MIL/uL — ABNORMAL LOW (ref 3.87–5.11)
RDW: 14.2 % (ref 11.5–15.5)
WBC: 6.7 10*3/uL (ref 4.0–10.5)

## 2018-04-18 LAB — GLUCOSE, CAPILLARY
GLUCOSE-CAPILLARY: 96 mg/dL (ref 70–99)
Glucose-Capillary: 188 mg/dL — ABNORMAL HIGH (ref 70–99)
Glucose-Capillary: 79 mg/dL (ref 70–99)
Glucose-Capillary: 212 mg/dL — ABNORMAL HIGH (ref 70–99)

## 2018-04-18 MED ORDER — HEPARIN SODIUM (PORCINE) 5000 UNIT/ML IJ SOLN
5000.0000 [IU] | Freq: Three times a day (TID) | INTRAMUSCULAR | Status: DC
  Administered 2018-04-18 – 2018-04-28 (×26): 5000 [IU] via SUBCUTANEOUS
  Filled 2018-04-18 (×27): qty 1

## 2018-04-18 MED ORDER — SEVELAMER CARBONATE 800 MG PO TABS
800.0000 mg | ORAL_TABLET | Freq: Three times a day (TID) | ORAL | Status: DC
  Administered 2018-04-18 – 2018-04-28 (×27): 800 mg via ORAL
  Filled 2018-04-18 (×27): qty 1

## 2018-04-18 NOTE — Plan of Care (Signed)
  Problem: Spiritual Needs Goal: Ability to function at adequate level Outcome: Not Progressing   Problem: Consults Goal: [facility] GENERAL PATIENT EDUCATION Description See Patient Education module for education specifics. Outcome: Not Progressing Goal: Skin Care Protocol Initiated - if Braden Score 18 or less Description If consults are not indicated, leave blank or document N/A Outcome: Not Progressing Goal: Nutrition Consult-if indicated Outcome: Not Progressing Goal: Diabetes Guidelines if Diabetic/Glucose > 140 Description If diabetic or lab glucose is > 140 mg/dl - Initiate Diabetes/Hyperglycemia Guidelines & Document Interventions  Outcome: Not Progressing   Problem: RH BOWEL ELIMINATION Goal: RH STG MANAGE BOWEL WITH ASSISTANCE Description STG Manage Bowel with min Assistance.  Outcome: Not Progressing Goal: RH STG MANAGE BOWEL W/MEDICATION W/ASSISTANCE Description STG Manage Bowel with Medication with min  Assistance.  Outcome: Not Progressing   Problem: RH SKIN INTEGRITY Goal: RH STG SKIN FREE OF INFECTION/BREAKDOWN Description Min A  Outcome: Not Progressing Goal: RH STG MAINTAIN SKIN INTEGRITY WITH ASSISTANCE Description STG Maintain Skin Integrity With min  Assistance.  Outcome: Not Progressing Goal: RH STG ABLE TO PERFORM INCISION/WOUND CARE W/ASSISTANCE Description STG Able To Perform Incision/Wound Care With min  Assistance.  Outcome: Not Progressing   Problem: RH SAFETY Goal: RH STG ADHERE TO SAFETY PRECAUTIONS W/ASSISTANCE/DEVICE Description STG Adhere to Safety Precautions With min  Assistance/Device.  Outcome: Not Progressing   Problem: RH PAIN MANAGEMENT Goal: RH STG PAIN MANAGED AT OR BELOW PT'S PAIN GOAL Outcome: Not Progressing   Problem: RH KNOWLEDGE DEFICIT GENERAL Goal: RH STG INCREASE KNOWLEDGE OF SELF CARE AFTER HOSPITALIZATION Outcome: Not Progressing  Pt total care for all ADLs, frequent loose bm's causing pain, c/o fatigue, pain.

## 2018-04-18 NOTE — Progress Notes (Signed)
[facility] for heparin Indication: VTE prophylaxis  Allergies  Allergen Reactions  . Gabapentin Other (See Comments)    Hallucinations and "Makes me go crazy"   . Sulfa Antibiotics Other (See Comments)    Altered mental state   . Amlodipine Other (See Comments)    "Makes my legs swell"; edema    Patient Measurements: Height: 5\' 5"  (165.1 cm) Weight: 159 lb 13.3 oz (72.5 kg) IBW/kg (Calculated) : 57 Heparin Dosing Weight: N/A  Vital Signs: Temp: 97.7 F (36.5 C) (10/05 0603) Temp Source: Oral (10/05 0603) BP: 155/43 (10/05 0603) Pulse Rate: 76 (10/05 0619)  Labs: Recent Labs    04/15/18 1708 04/16/18 1409  HGB 9.3* 8.9*  HCT 29.7* 28.4*  PLT 208 212  CREATININE 4.43* 5.46*    Estimated Creatinine Clearance: 8.5 mL/min (A) (by C-G formula based on SCr of 5.46 mg/dL (H)).   Medical History: Past Medical History:  Diagnosis Date  . Anemia   . Arthritis   . Atrial fibrillation ([place])   . Blood transfusion   . CAD (coronary artery disease)   . Cancer ([place])    .  top of head- melonoma  . Carotid artery occlusion    Carotid Endartectom,y - left 2009.  Blockage Right being watched by Dr Scot Dock.  . Carotid stenosis   . 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 Complication of anesthesia    pt. states that she was difficult to wake  . Depression   . Diabetes mellitus without complication ([place])   . Dysrhythmia   . General weakness 12/2015  . GERD (gastroesophageal reflux disease)   . Glaucoma   . History of hiatal hernia   . History of kidney stones    passed  . History of pneumonia   . HOH (hard of hearing)   . Hypertension   . Hypokalemia 12/2015  . Myocardial infarction ([place])   . Neuromuscular disorder ([place])    CARPEL TUNNEL  . Pneumonia   . Shortness of breath   . Stroke ([place])    hx of TIA    Medications:  Scheduled:  . aspirin  81 mg Oral Daily  . atorvastatin  40 mg Oral q1800  . B-complex  with vitamin C  1 tablet Per Tube Daily  . brimonidine  1 drop Left Eye TID  . Chlorhexidine Gluconate Cloth  6 each Topical Q0600  . darbepoetin (ARANESP) injection - DIALYSIS  100 mcg Intravenous Q Thu-HD  . feeding supplement (NEPRO CARB STEADY)  237 mL Oral BID BM  . hydrALAZINE  25 mg Oral Q8H  . insulin aspart  2-6 Units Subcutaneous TID WC  . insulin glargine  15 Units Subcutaneous Daily  . latanoprost  1 drop Both Eyes QHS  . saccharomyces boulardii  250 mg Oral BID  . ticagrelor  90 mg Oral BID  . 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 Pharmacy has been consulted for heparin dosing for VTE prophylaxis since she is on aspirin and brilinta, and is on HD. CBC is stable with Hgb at 8.9 and platelets at 212.  Goal of Therapy:  Monitor platelets by anticoagulation protocol: Yes   Plan:  Heparin 5000 units subcutaneous q8h Monitor CBC and for s/sx of bleeding  Pharmacy will sign off given  future dosing changes will be unlikely. Thank you for allowing pharmacy to be a part of this patient's care.  Jackson Latino, PharmD PGY1 Pharmacy Resident Phone [phone] 04/18/2018     12:28 PM

## 2018-04-18 NOTE — Progress Notes (Signed)
[employer] PHYSICAL MEDICINE & REHABILITATION     PROGRESS NOTE  Subjective/Complaints:  Patient seen laying in bed this morning.  She states she did not sleep well overnight.  She cannot identify reason.  ROS: Denies CP, SOB, N/V/D  Objective: Vital Signs: Blood pressure (!) 155/43, pulse 76, temperature 97.7 F (36.5 C), temperature source Oral, resp. rate 16, height 5\' 5"  (1.651 m), weight 72.5 kg, SpO2 100 %. No results found. Recent Labs    04/15/18 1708 04/16/18 1409  WBC 9.8 10.5  HGB 9.3* 8.9*  HCT 29.7* 28.4*  PLT 208 212   Recent Labs    04/15/18 1708 04/16/18 1409  NA  --  135  K  --  3.5  CL  --  95*  GLUCOSE  --  226*  BUN  --  41*  CREATININE 4.43* 5.46*  CALCIUM  --  8.7*   CBG (last 3)  Recent Labs    04/17/18 1715 04/17/18 2128 04/18/18 0652  GLUCAP 93 170* 96    Wt Readings from Last 3 Encounters:  04/18/18 72.5 kg  04/15/18 72.8 kg  01/28/18 77.1 kg    Physical Exam:  BP (!) 155/43 (BP Location: Right Arm)   Pulse 76   Temp 97.7 F (36.5 C) (Oral)   Resp 16   Ht 5\' 5"  (1.651 m)   Wt 72.5 kg   SpO2 100%   BMI 26.60 kg/m  Constitutional: No distress . Vital signs reviewed. HENT: Normocephalic.  Atraumatic. Eyes: EOMI. No discharge. Cardiovascular: RRR. No JVD. Respiratory: CTA Bilaterally. Normal effort. GI: BS +. Non-distended. Musculoskeletal: Generalized edema, stable  Neurological: She is alert.  A&Ox3 with cues.  HOH Some delays in processing Motor: 4-/5 grossly throughout Skin: Skin is warm and dry.   Psychiatric: flat, cooperative   Assessment/Plan: 1. Functional deficits secondary to debility which require 3+ hours per day of interdisciplinary therapy in a comprehensive inpatient rehab setting. Physiatrist is providing close team supervision and 24 hour management of active medical problems listed below. Physiatrist and rehab team continue to assess barriers to discharge/monitor patient progress toward functional  and medical goals.  Function:  Bathing Bathing position      Bathing parts      Bathing assist        Upper Body Dressing/Undressing Upper body dressing                    Upper body assist        Lower Body Dressing/Undressing Lower body dressing                                  Lower body assist        Toileting Toileting          Toileting assist     Transfers Chair/bed transfer             Locomotion Ambulation           Wheelchair          Cognition Comprehension    Expression    Social Interaction    Problem Solving    Memory      Medical Problem List and Plan: 1.  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  DVT Prophylaxis/Anticoagulation: Subcutaneous heparin.  Monitor for any bleeding episodes 3. Pain Management: Tylenol as needed 4. Mood:  Provide emotional support.  Off of Celexa since hospitalization.  Avoiding QT prolonging agents 5. Neuropsych: This patient is ?fully capable of making decisions on her own behalf. 6. Skin/Wound Care: Routine skin checks  -local care to buttocks to keep clean/dry given diarrhea 7. Fluids/Electrolytes/Nutrition: Routine in and outs with follow-up chemistries 8.  Hypertension.  Hydralazine 25 mg every 8 hours  Labile on 10/5  Monitor with increased mobility 9.  Nonsustained V. Tach/bradycardia.  Patient is asymptomatic.  Avoid QT prolonging agents.  Follow-up per cardiology services.  Patient is DNR 10.  C. difficile.  Flexi-Seal discontinued.  Contact precautions.    Vancomycin completed  Pt with ongoing mushy/soft stools  Added florastor  Encourage PO intake 11.  End-stage renal disease with hemodialysis.  Hemodialysis recently initiated 3 to 4 weeks ago.    -HD in afternoons after therapy  -electrolyte/volume mgt per nephrology 12.  Diabetes mellitus with peripheral neuropathy.  Lantus insulin 15 units  daily.  Check blood sugars before meals and at bedtime  inconsistent/poor intake  -continue SSI   Labile on 10/5  13.  History of CAD with CABG 2013.  Continue aspirin 14.  Hyperlipidemia.  Lipitor 15.  Acute on chronic anemia.  Continue Aranesp  Hemoglobin 8.9 on 10/3  Recheck with HD  LOS (Days) 3 A FACE TO FACE EVALUATION WAS PERFORMED  [name] 04/18/2018 8:13 AM

## 2018-04-18 NOTE — Progress Notes (Addendum)
[facility] Progress Note  Subjective: no new c/o's, working w/ PT, needs assist to get OOB and ambulate   Physical Exam: Vitals:   04/17/18 1600 04/17/18 1950 04/18/18 0603 04/18/18 0619  BP: (!) 116/39 (!) 156/40 (!) 155/43   Pulse: 61 78 (!) 36 76  Resp: 12 16 16    Temp: 97.6 F (36.4 C) 97.9 F (36.6 C) 97.7 F (36.5 C)   TempSrc: Oral Oral Oral   SpO2: 97% 98% 100% 100%  Weight:   72.5 kg   Height:        Inpatient medications: . aspirin  81 mg Oral Daily  . atorvastatin  40 mg Oral q1800  . B-complex with vitamin C  1 tablet Per Tube Daily  . brimonidine  1 drop Left Eye TID  . Chlorhexidine Gluconate Cloth  6 each Topical Q0600  . darbepoetin (ARANESP) injection - DIALYSIS  100 mcg Intravenous Q Thu-HD  . feeding supplement (NEPRO CARB STEADY)  237 mL Oral BID BM  . heparin  5,000 Units Subcutaneous Q8H  . hydrALAZINE  25 mg Oral Q8H  . insulin aspart  2-6 Units Subcutaneous TID WC  . insulin glargine  15 Units Subcutaneous Daily  . latanoprost  1 drop Both Eyes QHS  . saccharomyces boulardii  250 mg Oral BID  . ticagrelor  90 mg Oral BID  . timolol  1 drop Left Eye Daily   . sodium chloride    . [START ON 04/21/2018] ferric gluconate (FERRLECIT/NULECIT) IV     sodium chloride, acetaminophen, alteplase, calcium carbonate, Gerhardt's butt cream, guaiFENesin-dextromethorphan, heparin, lidocaine (PF), lidocaine-prilocaine, pentafluoroprop-tetrafluoroeth Exam: General: Lying tired elderly debilitated WF obese pleasant Heart:RRR, s1s2 nl Lungs: Clear bilateral, good air entry, no wheezing Abdomen: Soft, nontender, nondistended Extremities: No edema Dialysis Access: Left upper extremity AV graft has good bruit.   Dialysis:[facility]  4h 76kg 3K/ 2Ca bath LUA AVG Hep none - mircera 100 q 2   Impression/ Plan: 1) VF arrest: Positive troponin echo with new wall motion abnormalities. LVEF 40-45%.  S/p cath on 9/25 w/ PCI of the SVG to RCA. Now  on aspirin and brilinta for at least 12 months. Seen by EP, stable.   2) Vent tachycardia nonsustained, torsades (9/26- 9/27): prob reperfusion event after PCI per EP. No indication for ICD / PPM. Resolved.  3) ESRD: TTS HD. HD today. K+ low  4) Anemia: Hemoglobin around 9. Iron sat 15 %, ferritin 1432, on weekly nulecit. On mircera.  5) HTN/volume: stable BP on hydralazine 25 tid. Had very low BP's on HD 10/1, under dry, letting vol come up some, let wt's come up. No UF HD today.   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 Call w/ any questions over the rest of the weekend.   [name] MD [facility] pager [phone]   04/18/2018, 11:50 AM   Recent Labs  Lab 04/14/18 0221 04/15/18 0243 04/15/18 1708 04/16/18 1409  NA 134* 136  --  135  K 4.1 3.3*  --  3.5  CL 94* 95*  --  95*  CO2 19* 27  --  24  GLUCOSE 138* 95  --  226*  BUN 47* 17  --  41*  CREATININE 5.88* 3.21* 4.43* 5.46*  CALCIUM 9.0 8.7*  --  8.7*  PHOS 7.2*  --   --  5.3*  ALBUMIN 2.5*  --   --  2.3*   No results for input(s): AST, ALT, ALKPHOS, BILITOT, PROT in the last 168 hours. Recent Labs  Lab 04/15/18 1708 04/16/18 1409  WBC 9.8 10.5  HGB 9.3* 8.9*  HCT 29.7* 28.4*  MCV 108.0* 107.2*  PLT 208 212

## 2018-04-19 ENCOUNTER — Inpatient Hospital Stay (HOSPITAL_COMMUNITY): Payer: Medicare Other | Admitting: Occupational Therapy

## 2018-04-19 ENCOUNTER — Inpatient Hospital Stay (HOSPITAL_COMMUNITY): Payer: Medicare Other

## 2018-04-19 DIAGNOSIS — K649 Unspecified hemorrhoids: Secondary | ICD-10-CM

## 2018-04-19 LAB — GLUCOSE, CAPILLARY
GLUCOSE-CAPILLARY: 148 mg/dL — AB (ref 70–99)
Glucose-Capillary: 86 mg/dL (ref 70–99)
Glucose-Capillary: 142 mg/dL — ABNORMAL HIGH (ref 70–99)
Glucose-Capillary: 171 mg/dL — ABNORMAL HIGH (ref 70–99)

## 2018-04-19 MED ORDER — HYDROCORTISONE 2.5 % RE CREA
TOPICAL_CREAM | Freq: Two times a day (BID) | RECTAL | Status: DC
  Administered 2018-04-19 – 2018-04-20 (×4): via RECTAL
  Filled 2018-04-19: qty 28.35

## 2018-04-19 NOTE — Progress Notes (Signed)
[facility] Progress Note  Subjective: says not making progress she'd hoped for.  No new c/o.    Physical Exam: Vitals:   04/18/18 2041 04/19/18 0500 04/19/18 0555 04/19/18 0556  BP: (!) 163/43   (!) 160/41  Pulse: 76   73  Resp: 18   16  Temp: 98.3 F (36.8 C)  98.1 F (36.7 C)   TempSrc:   Oral   SpO2: 97%   97%  Weight:  72.3 kg    Height:        Inpatient medications: . aspirin  81 mg Oral Daily  . atorvastatin  40 mg Oral q1800  . B-complex with vitamin C  1 tablet Per Tube Daily  . brimonidine  1 drop Left Eye TID  . Chlorhexidine Gluconate Cloth  6 each Topical Q0600  . darbepoetin (ARANESP) injection - DIALYSIS  100 mcg Intravenous Q Thu-HD  . feeding supplement (NEPRO CARB STEADY)  237 mL Oral BID BM  . heparin  5,000 Units Subcutaneous Q8H  . hydrALAZINE  25 mg Oral Q8H  . hydrocortisone   Rectal BID  . insulin aspart  2-6 Units Subcutaneous TID WC  . insulin glargine  15 Units Subcutaneous Daily  . latanoprost  1 drop Both Eyes QHS  . saccharomyces boulardii  250 mg Oral BID  . sevelamer carbonate  800 mg Oral TID WC  . ticagrelor  90 mg Oral BID  . timolol  1 drop Left Eye Daily   . [START ON 04/21/2018] ferric gluconate (FERRLECIT/NULECIT) IV     acetaminophen, calcium carbonate, Gerhardt's butt cream, guaiFENesin-dextromethorphan Exam: General: Lying tired elderly debilitated WF obese pleasant Heart:RRR, s1s2 nl Lungs: Clear bilateral, good air entry, no wheezing Abdomen: Soft, nontender, nondistended Extremities: No edema Dialysis Access: Left upper extremity AV graft has good bruit.   [facility]  4h 76kg 3K/ 2Ca bath LUA AVG Hep none - mircera 100 q 2   Impression/ Plan: 1) VF arrest: Positive troponin echo with new wall motion abnormalities. LVEF 40-45%.  S/p cath on 9/25 w/ PCI of the SVG to RCA. Now on aspirin and brilinta for at least 12 months. Seen by EP, stable.   2) Vent tachycardia nonsustained, torsades  (9/26- 9/27): prob reperfusion event after PCI per EP. No indication for ICD / PPM. Resolved.  3) ESRD: TTS HD. HD Tuesday. K+ low  4) Anemia: Hemoglobin around 9. Iron sat 15 %, ferritin 1432 > weekly nulecit ordered. On mircera.  5) HTN/volume: stable BP on hydralazine 25 tid. Had very low BP's on HD 10/1, under dry, no need UF for now. wts stable.    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 Recent Labs  Lab 04/16/18 1409 04/18/18 1307  WBC 10.5 6.7  HGB 8.9* 8.0*  HCT 28.4* 26.2*  MCV 107.2*  109.6*  PLT 212 168

## 2018-04-19 NOTE — Progress Notes (Addendum)
Pt went to dayroom with family and friends to attend the dancing session. Pt was one person-stand by assist and followed directions.

## 2018-04-19 NOTE — Progress Notes (Signed)
Occupational Therapy Session Note  Patient Details  Name: [name] MRN: [id number] Date of Birth: May 16, 1941  Today's Date: 04/19/2018 OT Group Time: 1131-1201 OT Group Time Calculation (min): 30 min   Skilled Therapeutic Interventions/Progress Updates:    Pt participated in therapeutic w/c level dance group with focus on UE/LE strengthening, activity tolerance, and social participation for carryover during self care tasks. Pt was guided through various dance-based exercises involving UB/LB and trunk. Emphasis placed on functional endurance. 30 minutes missed due to RN care. When she arrived, pt was actively engaged in tx. Pt paced herself and took several rest breaks. She smiled and interacted with others. At end of session she was taken back to room by RT.   Therapy Documentation Precautions:  Precautions Precautions: Fall Precaution Comments: chest soreness from CPR, watch vitals  Restrictions Weight Bearing Restrictions: No Pain: No s/s pain during session  Pain Assessment Pain Scale: 0-10 Pain Score: 0-No pain ADL: ADL Eating: Set up Where Assessed-Eating: Bed level Grooming: Supervision/safety Where Assessed-Grooming: Edge of bed Upper Body Bathing: Minimal cueing, Minimal assistance Where Assessed-Upper Body Bathing: Edge of bed Lower Body Bathing: Minimal cueing, Maximal assistance Where Assessed-Lower Body Bathing: Edge of bed Upper Body Dressing: Minimal assistance, Minimal cueing Where Assessed-Upper Body Dressing: Edge of bed Lower Body Dressing: Maximal assistance, Moderate cueing Where Assessed-Lower Body Dressing: Edge of bed Toileting: Maximal assistance, Moderate cueing Where Assessed-Toileting: Bedside Commode Toilet Transfer: Moderate assistance Toilet Transfer Method: Stand pivot Toilet Transfer Equipment: Bedside commode Tub/Shower Transfer: Unable to assess      Therapy/Group: Group Therapy  Emmalena Canny A Blythe Veach 04/19/2018, 12:52 PM

## 2018-04-19 NOTE — Progress Notes (Signed)
Physical Therapy Session Note  Patient Details  Name: [name] MRN: [id number] Date of Birth: 1940-12-30  Today's Date: 04/19/2018 PT Individual Time: 0800-0857 PT Individual Time Calculation (min): 57 min   Short Term Goals: Week 1:  PT Short Term Goal 1 (Week 1): Patient to be able to complete functional bed mobility with min guard  PT Short Term Goal 2 (Week 1): Patient to be able to transfer to [facility] with MinA on consistent basis  PT Short Term Goal 3 (Week 1): Patient to be able to self-propel WC 17ft  PT Short Term Goal 4 (Week 1): Patient to initiate gait with LRAD   Skilled Therapeutic Interventions/Progress Updates:    Pt supine in bed upon PT arrival, agreeable to therapy tx and denies pain. Pt transferred from supine>sitting EOB with supervision and use of bedrails. Pt worked on sitting balance while sitting EOB to eat breakfast with supervision, BP in sitting 122/38. Pt performed x 5 sit<>stnads this session from EOB with RW and min-mod assist, verbal cues for techniques and pt limited by fatigue, seated rest breaks between. BP following standing 133/45. Pt incontinent of bowel. Pt transferred to supine with min assist for LE management. Pt performed rolling in both directions with supervision and use of bedrails, therapist cleaned peri area and donned clean brief total assist. Pt left supine in bed at end of session with needs in reach and bed alarm set.   Therapy Documentation Precautions:  Precautions Precautions: Fall Precaution Comments: chest soreness from CPR, watch vitals  Restrictions Weight Bearing Restrictions: No    Therapy/Group: Individual Therapy  Netta Corrigan, PT, DPT 04/19/2018, 7:55 AM

## 2018-04-19 NOTE — Progress Notes (Signed)
Occupational Therapy Session Note  Patient Details  Name: [name] MRN: [id number] Date of Birth: 11-23-1940  Today's Date: 04/19/2018 OT Individual Time: 4944-9675 OT Individual Time Calculation (min): 80 min   Short Term Goals: Week 1:  OT Short Term Goal 1 (Week 1): Pt will don shirt EOB with CGA OT Short Term Goal 2 (Week 1): Pt will transfer to [facility] with min A OT Short Term Goal 3 (Week 1): Pt will don pants with mod A  OT Short Term Goal 4 (Week 1): Pt will complete peri hygiene following toileting with mod A  Skilled Therapeutic Interventions/Progress Updates:    Pt greeted in w/c with family present. C/o buttocks pain. RN notified at start of session to provide pain medicine. After handwashing, pt was escorted to dayroom. Tx focus on standing endurance and self efficacy during meaningful IADL engagement. Per family, pt is an avid cleaner. Had her wash the large elevated table. Mod A sit<stand with cues for hand placement. Pt initially able to stand for 1 minute windows, progressing to 2 minutes 40 seconds at maximum. Pt expressed that fear of falling and anxiousness made standing very difficult. We practiced deep breathing techniques in sitting and standing positions, as well as positive self talk (which pt says she used to do at home). This appeared to help with increasing standing tolerance. Pt c/o minimal dizziness which absolved with seated rest. 02 sats on RA 98-99% throughout. BP WNL per RN. Pt then c/o intensified pain in buttocks, reporting brief was soiled. She was taken back to room and transferred to bed with Mod A using RW. Pt rolling Lt>Rt with Min A for perihygiene and brief change. She doffed shirt EOB and donned clean hospital gown with supervision. Pt then returned to supine with Min-Mod A. Using props, OT positioned her in sidelying for pressure relief off buttocks. Pt reported increased comfort in this position. Advised family to help her change positions in bed  frequently for pain mgt/pressure relief. They were present to observe and assist. At end of session pt was left with family and all needs.    Therapy Documentation Precautions:  Precautions Precautions: Fall Precaution Comments: chest soreness from CPR, watch vitals  Restrictions Weight Bearing Restrictions: No General: General OT Amount of Missed Time: 30 Minutes Vital Signs: Therapy Vitals Temp: 98.2 F (36.8 C) Pulse Rate: 66 BP: (!) 125/33 Patient Position (if appropriate): Sitting Oxygen Therapy SpO2: 98 % O2 Device: Room Air Pain: Stated/addressed above Pain Assessment Pain Scale: 0-10 Pain Score: 3  ADL: ADL Eating: Set up Where Assessed-Eating: Bed level Grooming: Supervision/safety Where Assessed-Grooming: Edge of bed Upper Body Bathing: Minimal cueing, Minimal assistance Where Assessed-Upper Body Bathing: Edge of bed Lower Body Bathing: Minimal cueing, Maximal assistance Where Assessed-Lower Body Bathing: Edge of bed Upper Body Dressing: Minimal assistance, Minimal cueing Where Assessed-Upper Body Dressing: Edge of bed Lower Body Dressing: Maximal assistance, Moderate cueing Where Assessed-Lower Body Dressing: Edge of bed Toileting: Maximal assistance, Moderate cueing Where Assessed-Toileting: Bedside Commode Toilet Transfer: Moderate assistance Toilet Transfer Method: Stand pivot Toilet Transfer Equipment: Bedside commode Tub/Shower Transfer: Unable to assess :     Therapy/Group: Individual Therapy  Adamariz Gillott A Torin Whisner 04/19/2018, 4:11 PM

## 2018-04-19 NOTE — Progress Notes (Signed)
[place] PHYSICAL MEDICINE & REHABILITATION     PROGRESS NOTE  Subjective/Complaints:  Patient seen lying in bed this morning.  She states she slept better overnight.  Per nursing, patient with hemorrhoids.  ROS: Denies CP, SOB, N/V/D  Objective: Vital Signs: Blood pressure (!) 160/41, pulse 73, temperature 98.1 F (36.7 C), temperature source Oral, resp. rate 16, height 5\' 5"  (1.651 m), weight 72.3 kg, SpO2 97 %. No results found. Recent Labs    04/16/18 1409 04/18/18 1307  WBC 10.5 6.7  HGB 8.9* 8.0*  HCT 28.4* 26.2*  PLT 212 168   Recent Labs    04/16/18 1409 04/18/18 1308  NA 135 134*  K 3.5 4.3  CL 95* 95*  GLUCOSE 226* 231*  BUN 41* 30*  CREATININE 5.46* 4.55*  CALCIUM 8.7* 9.0   CBG (last 3)  Recent Labs    04/18/18 1804 04/18/18 2211 04/19/18 0633  GLUCAP 79 212* 86    Wt Readings from Last 3 Encounters:  04/19/18 72.3 kg  04/15/18 72.8 kg  01/28/18 77.1 kg    Physical Exam:  BP (!) 160/41 (BP Location: Right Arm)   Pulse 73   Temp 98.1 F (36.7 C) (Oral)   Resp 16   Ht 5\' 5"  (1.651 m)   Wt 72.3 kg   SpO2 97%   BMI 26.52 kg/m  Constitutional: No distress . Vital signs reviewed. HENT: Normocephalic.  Atraumatic. Eyes: EOMI. No discharge. Cardiovascular: RRR.  No JVD. Respiratory: CTA bilaterally.  Normal effort. GI: BS +. Non-distended. Musculoskeletal: Generalized edema, unchanged Neurological: She is alert.  A&O.  HOH Some delays in processing Motor: 4/5 grossly throughout Skin: Skin is warm and dry.   Psychiatric: flat, cooperative   Assessment/Plan: 1. Functional deficits secondary to debility which require 3+ hours per day of interdisciplinary therapy in a comprehensive inpatient rehab setting. Physiatrist is providing close team supervision and 24 hour management of active medical problems listed below. Physiatrist and rehab team continue to assess barriers to discharge/monitor patient progress toward functional and medical  goals.  Function:  Bathing Bathing position      Bathing parts      Bathing assist        Upper Body Dressing/Undressing Upper body dressing                    Upper body assist        Lower Body Dressing/Undressing Lower body dressing                                  Lower body assist        Toileting Toileting          Toileting assist     Transfers Chair/bed transfer             Locomotion Ambulation           Wheelchair          Cognition Comprehension    Expression    Social Interaction    Problem Solving    Memory      Medical Problem List and Plan: 1.  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  DVT Prophylaxis/Anticoagulation: Subcutaneous heparin.  Monitor for any bleeding episodes 3. Pain Management: Tylenol as needed 4. Mood: Provide emotional support.  Off of Celexa since hospitalization.  Avoiding QT prolonging agents  5. Neuropsych: This patient is ?fully capable of making decisions on her own behalf. 6. Skin/Wound Care: Routine skin checks  -local care to buttocks to keep clean/dry given diarrhea 7. Fluids/Electrolytes/Nutrition: Routine in and outs with follow-up chemistries 8.  Hypertension.  Hydralazine 25 mg every 8 hours  ?  Trending up on 10/6, will consider medication adjustments if persistently elevated  Monitor with increased mobility 9.  Nonsustained V. Tach/bradycardia.  Patient is asymptomatic.  Avoid QT prolonging agents.  Follow-up per cardiology services.  Patient is DNR 10.  C. difficile.  Flexi-Seal discontinued.  Contact precautions.    Vancomycin completed  Pt with ongoing mushy/soft stools  Added florastor  Encourage PO intake 11.  End-stage renal disease with hemodialysis.  Hemodialysis recently initiated 3 to 4 weeks ago.    -HD in afternoons after therapy  -electrolyte/volume mgt per nephrology 12.  Diabetes  mellitus with peripheral neuropathy.  Lantus insulin 15 units daily.  Check blood sugars before meals and at bedtime  inconsistent/poor intake  -continue SSI   Labile on 10/6  13.  History of CAD with CABG 2013.  Continue aspirin 14.  Hyperlipidemia.  Lipitor 15.  Acute on chronic anemia.  Continue Aranesp  Hemoglobin 8.0 on 10/5  Recheck with HD 16.  Hemorrhoids  Anusol ordered on 10/6  LOS (Days) 4 A FACE TO FACE EVALUATION WAS PERFORMED  Ankit Lorie Phenix 04/19/2018 7:17 AM

## 2018-04-19 NOTE — Progress Notes (Signed)
Daughter would like case manager to assist with get a cpap before being discharge.

## 2018-04-19 NOTE — Progress Notes (Signed)
During this shift, patient had multiple loose bowel movements. She c/o pain & burning to the area & when checked, she had a small amount of stool. Patient has hemorrhoids that look like they are getting inflammed. Small ulcer to the right buttock is still open. Gerdharts cream applied and skin barrier also applied to her buttocks to protect against the moisture. A communication was placed for the physician for possible hemorrhoidal cream. Also spoke to the on call physician when he came for rounds this morning. No c/o pain this morning. During the night she complained of tightness & soreness to her chest, but it was after she pulled herself over onto her side during hygiene care. Her vitals were consistent with the previous vitals & no distress noted. Was noted sleeping shortly afterwards. Will continue to monitor

## 2018-04-19 NOTE — Progress Notes (Signed)
Pt noted it is difficult for her to sleep some nights, "its hard for her to fall asleep and/or she awaken at different intervals at night. Pt may need prn for sleep. She does not have any problems sleeping at home.

## 2018-04-19 NOTE — Plan of Care (Signed)
  Problem: RH SKIN INTEGRITY Goal: RH STG MAINTAIN SKIN INTEGRITY WITH ASSISTANCE Description STG Maintain Skin Integrity With min  Assistance.  Outcome: Progressing Goal: RH STG ABLE TO PERFORM INCISION/WOUND CARE W/ASSISTANCE Description STG Able To Perform Incision/Wound Care With min  Assistance.  Outcome: Progressing  Keep skin dry, administered barrier on skin as ordered.  Problem: RH SKIN INTEGRITY Goal: RH STG MAINTAIN SKIN INTEGRITY WITH ASSISTANCE Description STG Maintain Skin Integrity With min  Assistance.  Outcome: Progressing Goal: RH STG ABLE TO PERFORM INCISION/WOUND CARE W/ASSISTANCE Description STG Able To Perform Incision/Wound Care With min  Assistance.  Outcome: Progressing   Problem: RH SAFETY Goal: RH STG ADHERE TO SAFETY PRECAUTIONS W/ASSISTANCE/DEVICE Description STG Adhere to Safety Precautions With min  Assistance/Device.  Outcome: Progressing  Call light within reach, proper footwear,

## 2018-04-20 ENCOUNTER — Inpatient Hospital Stay (HOSPITAL_COMMUNITY): Payer: Medicare Other | Admitting: Physical Therapy

## 2018-04-20 ENCOUNTER — Inpatient Hospital Stay (HOSPITAL_COMMUNITY): Payer: Medicare Other

## 2018-04-20 LAB — GLUCOSE, CAPILLARY
Glucose-Capillary: 96 mg/dL (ref 70–99)
Glucose-Capillary: 107 mg/dL — ABNORMAL HIGH (ref 70–99)
Glucose-Capillary: 155 mg/dL — ABNORMAL HIGH (ref 70–99)
Glucose-Capillary: 112 mg/dL — ABNORMAL HIGH (ref 70–99)

## 2018-04-20 MED ORDER — CHLORHEXIDINE GLUCONATE CLOTH 2 % EX PADS: 6.0000 | MEDICATED_PAD | Freq: Every day | CUTANEOUS | Status: DC

## 2018-04-20 NOTE — Progress Notes (Signed)
Occupational Therapy Session Note  Patient Details  Name: [name] MRN: [id number] Date of Birth: 17-Apr-1941  Today's Date: 04/20/2018 OT Individual Time: 6754-4920 OT Individual Time Calculation (min): 70 min    Short Term Goals: Week 1:  OT Short Term Goal 1 (Week 1): Pt will don shirt EOB with CGA OT Short Term Goal 2 (Week 1): Pt will transfer to [facility] with min A OT Short Term Goal 3 (Week 1): Pt will don pants with mod A  OT Short Term Goal 4 (Week 1): Pt will complete peri hygiene following toileting with mod A  Skilled Therapeutic Interventions/Progress Updates:    Pt supine in bed agreeable to therapy with c/o pain as described below. Pt incontinent of urine/BM upon entry in brief. Pt completed stand pivot EOB > w/c > BSC in shower with min A. Vc required for UE placement during transfer. In shower pt required cueing for LB bathing. Several BM's in shower without pt knowledge. Pt required mod A to wash buttocks standing in shower. Pt able to use B grab bars to power up from [facility] with CGA and remain standing with CGA. RN entered room as requested d/t skin breakdown observed under abdominal skin fold and to apply cream to pt's buttocks during standing. Pt completed grooming tasks sitting at sink with set up. Pt stood with RW and required min A to pull up pants in standing with only L UE support. Pt completed 8 ft of functional mobility with mod A from sink to chair in room with increased time and vc for sequencing. Pt was left sitting up in chair, positioned for buttocks pressure relief. Pt required several rest breaks during session but overall with increased functional activity tolerance and arousal.   Therapy Documentation Precautions:  Precautions Precautions: Fall Precaution Comments: chest soreness from CPR, watch vitals  Restrictions Weight Bearing Restrictions: No Pain: Pain Assessment Pain Scale: Faces Faces Pain Scale: Hurts little more Pain Type: Acute pain Pain  Location: Back Pain Orientation: Medial Pain Descriptors / Indicators: Aching Pain Onset: On-going Pain Intervention(s): Shower ADL: ADL Eating: Set up Where Assessed-Eating: Bed level Grooming: Supervision/safety Where Assessed-Grooming: Edge of bed Upper Body Bathing: Minimal cueing, Contact guard Where Assessed-Upper Body Bathing: Shower Lower Body Bathing: Minimal cueing, Moderate assistance Where Assessed-Lower Body Bathing: Shower Upper Body Dressing: Minimal cueing, Contact guard Where Assessed-Upper Body Dressing: Wheelchair Lower Body Dressing: Minimal assistance, Minimal cueing Where Assessed-Lower Body Dressing: Standing at sink Toileting: Maximal assistance, Moderate cueing Where Assessed-Toileting: Glass blower/designer: Moderate assistance Toilet Transfer Method: Stand pivot Toilet Transfer Equipment: Engineer, technical sales Transfer: Unable to assess Social research officer, government: Moderate cueing, Minimal assistance Social research officer, government Method: Radiographer, therapeutic: Other (comment)(drop arm BSC)   Therapy/Group: Individual Therapy  Curtis Sites 04/20/2018, 9:50 AM

## 2018-04-20 NOTE — Progress Notes (Signed)
[facility] Progress Note  Subjective: feels better today, making some progress   Physical Exam: Vitals:   04/19/18 1435 04/19/18 1925 04/19/18 2206 04/20/18 0434  BP: (!) 125/33 (!) 171/49 (!) 157/53 (!) 177/61  Pulse: 66 90 91 95  Resp:  16  16  Temp:  98.3 F (36.8 C)  98.1 F (36.7 C)  TempSrc:  Oral  Oral  SpO2: 98% 96% 96% 96%  Weight:    72.8 kg  Height:        Inpatient medications: . aspirin  81 mg Oral Daily  . atorvastatin  40 mg Oral q1800  . B-complex with vitamin C  1 tablet Per Tube Daily  . brimonidine  1 drop Left Eye TID  . Chlorhexidine Gluconate Cloth  6 each Topical Q0600  . darbepoetin (ARANESP) injection - DIALYSIS  100 mcg Intravenous Q Thu-HD  . feeding supplement (NEPRO CARB STEADY)  237 mL Oral BID BM  . heparin  5,000 Units Subcutaneous Q8H  . hydrALAZINE  25 mg Oral Q8H  . hydrocortisone   Rectal BID  . insulin aspart  2-6 Units Subcutaneous TID WC  . insulin glargine  15 Units Subcutaneous Daily  . latanoprost  1 drop Both Eyes QHS  . saccharomyces boulardii  250 mg Oral BID  . sevelamer carbonate  800 mg Oral TID WC  . ticagrelor  90 mg Oral BID  . timolol  1 drop Left Eye Daily   . [START ON 04/21/2018] ferric gluconate (FERRLECIT/NULECIT) IV     acetaminophen, calcium carbonate, Gerhardt's butt cream, guaiFENesin-dextromethorphan Exam: General: Lying tired elderly debilitated WF obese pleasant Heart:RRR, s1s2 nl Lungs: Clear bilateral, good air entry, no wheezing Abdomen: Soft, nontender, nondistended Extremities: No edema Dialysis Access: Left upper extremity AV graft has good bruit.   Dialysis:[facility]  4h 76kg 3K/ 2Ca bath LUA AVG Hep none - mircera 100 q 2   Impression/ Plan: 1) VF arrest: Positive troponin echo with new wall motion abnormalities. LVEF 40-45%.  S/p cath on 9/25 w/ PCI of the SVG to RCA. Now on aspirin and brilinta for at least 12 months. Seen by EP, stable.   2) Vent tachycardia  nonsustained, torsades (9/26- 9/27): prob reperfusion event after PCI per EP. No indication for ICD / PPM. Resolved.  3) ESRD: TTS HD. HD Tuesday. K+ low  4) Anemia: Hemoglobin around 9. Iron sat 15 %, ferritin 1432 > weekly nulecit ordered. On mircera.  5) HTN/volume: stable BP on hydralazine 25 tid. Had very low BP's on HD 10/1. Wt's stable at 72kg, min UF on HD tomorrow.  6) Limited code: YES for CPR and meds // NO for intubation, defib and NIPPV  7)  Debility: on CIR now  8)  MBD of ckd: Ca ok, P 5-7, added sevelamer 800 tid ac as binder    [name] MD [place] pager [phone]   04/20/2018, 9:37 AM   Recent Labs  Lab 04/16/18 1409 04/18/18 1308  NA 135 134*  K 3.5 4.3  CL 95* 95*  CO2 24 27  GLUCOSE 226* 231*  BUN 41* 30*  CREATININE 5.46* 4.55*  CALCIUM 8.7* 9.0  PHOS 5.3* 4.9*  ALBUMIN 2.3* 2.2*   No results for input(s): AST, ALT, ALKPHOS, BILITOT, PROT in the last 168 hours. Recent Labs  Lab 04/16/18 1409 04/18/18 1307  WBC 10.5 6.7  HGB 8.9* 8.0*  HCT 28.4* 26.2*  MCV 107.2* 109.6*  PLT 212 168

## 2018-04-20 NOTE — Progress Notes (Signed)
Progress Note  Patient Name: DONIQUA SAXBY Date of Encounter: 04/20/2018  Primary Cardiologist: Larae Grooms, MD   Subjective   Doing fairly well. Progressing with rehab. No cardiac complaints.   Inpatient Medications    Scheduled Meds: . aspirin  81 mg Oral Daily  . atorvastatin  40 mg Oral q1800  . B-complex with vitamin C  1 tablet Per Tube Daily  . brimonidine  1 drop Left Eye TID  . Chlorhexidine Gluconate Cloth  6 each Topical Q0600  . Chlorhexidine Gluconate Cloth  6 each Topical Q0600  . darbepoetin (ARANESP) injection - DIALYSIS  100 mcg Intravenous Q Thu-HD  . feeding supplement (NEPRO CARB STEADY)  237 mL Oral BID BM  . heparin  5,000 Units Subcutaneous Q8H  . hydrALAZINE  25 mg Oral Q8H  . hydrocortisone   Rectal BID  . insulin aspart  2-6 Units Subcutaneous TID WC  . insulin glargine  15 Units Subcutaneous Daily  . latanoprost  1 drop Both Eyes QHS  . saccharomyces boulardii  250 mg Oral BID  . sevelamer carbonate  800 mg Oral TID WC  . ticagrelor  90 mg Oral BID  . timolol  1 drop Left Eye Daily   Continuous Infusions: . [START ON 04/21/2018] ferric gluconate (FERRLECIT/NULECIT) IV     PRN Meds: acetaminophen, calcium carbonate, Gerhardt's butt cream, guaiFENesin-dextromethorphan   Vital Signs    Vitals:   04/19/18 1435 04/19/18 1925 04/19/18 2206 04/20/18 0434  BP: (!) 125/33 (!) 171/49 (!) 157/53 (!) 177/61  Pulse: 66 90 91 95  Resp:  16  16  Temp:  98.3 F (36.8 C)  98.1 F (36.7 C)  TempSrc:  Oral  Oral  SpO2: 98% 96% 96% 96%  Weight:    72.8 kg  Height:        Intake/Output Summary (Last 24 hours) at 04/20/2018 1232 Last data filed at 04/20/2018 0815 Gross per 24 hour  Intake 180 ml  Output -  Net 180 ml   Filed Weights   04/18/18 1700 04/19/18 0500 04/20/18 0434  Weight: 72.5 kg 72.3 kg 72.8 kg    Telemetry    N/A (not on tele) - Personally Reviewed  ECG    Last EKG 10/1 showed NSR, RBBB, HR 70s - Personally  Reviewed  Physical Exam   GEN: No acute distress.   Neck: No JVD Cardiac: RRR, no murmurs, rubs, or gallops.  Respiratory: Clear to auscultation bilaterally. GI: Soft, nontender, non-distended  MS: No edema; No deformity. Neuro:  Nonfocal  Psych: Normal affect   Labs    Chemistry Recent Labs  Lab 04/14/18 0221 04/15/18 0243 04/15/18 1708 04/16/18 1409 04/18/18 1308  NA 134* 136  --  135 134*  K 4.1 3.3*  --  3.5 4.3  CL 94* 95*  --  95* 95*  CO2 19* 27  --  24 27  GLUCOSE 138* 95  --  226* 231*  BUN 47* 17  --  41* 30*  CREATININE 5.88* 3.21* 4.43* 5.46* 4.55*  CALCIUM 9.0 8.7*  --  8.7* 9.0  ALBUMIN 2.5*  --   --  2.3* 2.2*  GFRNONAA 6* 13* 9* 7* 8*  GFRAA 7* 15* 10* 8* 10*  ANIONGAP 21* 14  --  16* 12     Hematology Recent Labs  Lab 04/15/18 1708 04/16/18 1409 04/18/18 1307  WBC 9.8 10.5 6.7  RBC 2.75* 2.65* 2.39*  HGB 9.3* 8.9* 8.0*  HCT 29.7* 28.4* 26.2*  MCV 108.0* 107.2* 109.6*  MCH 33.8 33.6 33.5  MCHC 31.3 31.3 30.5  RDW 14.4 14.4 14.2  PLT 208 212 168    Cardiac EnzymesNo results for input(s): TROPONINI in the last 168 hours. No results for input(s): TROPIPOC in the last 168 hours.   BNPNo results for input(s): BNP, PROBNP in the last 168 hours.   DDimer No results for input(s): DDIMER in the last 168 hours.   Radiology    No results found.  Cardiac Studies   Cardiac catheterization 04/08/18:  Ost 1st Mrg lesion is 75% stenosed.  Prox Cx lesion is 100% stenosed. Left to left collaterals. Small AV groove circumflex.  Ost 1st Diag lesion is 100% stenosed. SVG to diag is patent.  Prox LAD lesion is 95% stenosed. LIMA to LAD is patent.  Prox RCA lesion is 95% stenosed. Mid Graft lesion is 95% stenosed in SVG to PLA.  A drug-eluting stent was successfully placed using a STENT RESOLUTE ONYX 2.0X15.  Post intervention, there is a 0% residual stenosis.  LV end diastolic pressure is normal.  There is no aortic valve  stenosis.  Calcified femoral artery.  Echocardiogram 03/2018: Study Conclusions  - Left ventricle: The cavity size was normal. Wall thickness was increased in a pattern of mild LVH. Systolic function was mildly reduced. The estimated ejection fraction was in the range of 45% to 50%. There is akinesis of the basal-midinferolateral and inferior myocardium. Doppler parameters are consistent with abnormal left ventricular relaxation (grade 1 diastolic dysfunction). - Mitral valve: Calcified annulus. There was mild regurgitation.  Impressions:  - Akinesis of the basal/mid inferior and inferolateral walls with overall mildly reduced LV systolic function; mild diastolic dysfunction; mild LVH; mild MR.  Patient Profile     77 y.o. female admitted for cardiac VF arrest s/p revascularization of SVG to RCA graft on 04/08/2018 with improving encephalopathy, end-stage renal disease on hemodialysis, aspiration pneumonia, diabetes type 2. Seen by EP and no indication for AICD/PPM. Once medically stable, pt was transferred to [facility] on 04/15/18.   Assessment & Plan    Pt doing well from a cardiac standpoint and progressing well with inpatient rehab. Pt examined today and appears stable. I also dicussed pt with RN. Vital signs have been stable. No issues. Cardiology will sign off. Please contact if any additional needs from cardiology.    For questions or updates, please contact [place] Please consult [email or web address] for contact info under        Signed, Lyda Jester, PA-C  04/20/2018, 12:32 PM

## 2018-04-20 NOTE — Plan of Care (Signed)
  Problem: RH SAFETY Goal: RH STG ADHERE TO SAFETY PRECAUTIONS W/ASSISTANCE/DEVICE Description STG Adhere to Safety Precautions With min Assistance/Device.  Outcome: Progressing   

## 2018-04-20 NOTE — Progress Notes (Addendum)
[facility] PHYSICAL MEDICINE & REHABILITATION     PROGRESS NOTE  Subjective/Complaints:  Pt beginning to feel better. Stools more formed, less frequent---only one early this morning since 10pm last night. Stomach is "sore".   ROS: Patient denies fever, rash, sore throat, blurred vision, nausea, vomiting,   cough, shortness of breath or chest pain, joint or back pain, headache, or mood change.    Objective: Vital Signs: Blood pressure (!) 177/61, pulse 95, temperature 98.1 F (36.7 C), temperature source Oral, resp. rate 16, height 5\' 5"  (1.651 m), weight 72.8 kg, SpO2 96 %. No results found. Recent Labs    04/18/18 1307  WBC 6.7  HGB 8.0*  HCT 26.2*  PLT 168   Recent Labs    04/18/18 1308  NA 134*  K 4.3  CL 95*  GLUCOSE 231*  BUN 30*  CREATININE 4.55*  CALCIUM 9.0   CBG (last 3)  Recent Labs    04/19/18 1641 04/19/18 2202 04/20/18 0649  GLUCAP 171* 148* 96    Wt Readings from Last 3 Encounters:  04/20/18 72.8 kg  04/15/18 72.8 kg  01/28/18 77.1 kg    Physical Exam:  BP (!) 177/61 (BP Location: Right Arm)   Pulse 95   Temp 98.1 F (36.7 C) (Oral)   Resp 16   Ht 5\' 5"  (1.651 m)   Wt 72.8 kg   SpO2 96%   BMI 26.71 kg/m  Constitutional: No distress . Vital signs reviewed. HEENT: EOMI, oral membranes moist Neck: supple Cardiovascular: RRR without murmur. No JVD    Respiratory: CTA Bilaterally without wheezes or rales. Normal effort    GI: BS + Slightly tender Musculoskeletal: Generalized edema, unchanged Neurological: She is alert.  A&O.  HOH Some delays in processing Motor: 4/5 grossly throughout Skin: Skin is warm and dry.   Psychiatric: smiling, in better spirits   Assessment/Plan: 1. Functional deficits secondary to debility which require 3+ hours per day of interdisciplinary therapy in a comprehensive inpatient rehab setting. Physiatrist is providing close team supervision and 24 hour management of active medical problems listed  below. Physiatrist and rehab team continue to assess barriers to discharge/monitor patient progress toward functional and medical goals.  Function:  Bathing Bathing position      Bathing parts      Bathing assist        Upper Body Dressing/Undressing Upper body dressing                    Upper body assist        Lower Body Dressing/Undressing Lower body dressing                                  Lower body assist        Toileting Toileting          Toileting assist     Transfers Chair/bed transfer             Locomotion Ambulation           Wheelchair          Cognition Comprehension    Expression    Social Interaction    Problem Solving    Memory      Medical Problem List and Plan: 1.  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  DVT Prophylaxis/Anticoagulation: Subcutaneous heparin.  Monitor for any  bleeding episodes 3. Pain Management: Tylenol as needed 4. Mood: Provide emotional support.  Off of Celexa since hospitalization.  Avoiding QT prolonging agents 5. Neuropsych: This patient is ?fully capable of making decisions on her own behalf. 6. Skin/Wound Care: Routine skin checks  -continue local care to buttocks to keep clean/dry given diarrhea 7. Fluids/Electrolytes/Nutrition: improved PO intake (50-75%) 8.  Hypertension.  Hydralazine 25 mg every 8 hours  -increased over last 24+ hours. No change yet until more consistently elevated  Monitor with increased mobility 9.  Nonsustained V. Tach/bradycardia.  Patient is asymptomatic.  Avoid QT prolonging agents.  Follow-up per cardiology services.  Patient is DNR 10.  C. difficile.  Flexi-Seal discontinued.  Contact precautions.    Vancomycin completed  Stool consistency better, still soft, less frequent  Continue florastor  PO intake picking up 11.  End-stage renal disease with hemodialysis.   Hemodialysis recently initiated 3 to 4 weeks ago.    -HD in afternoons after therapy  -electrolyte/volume mgt per nephrology 12.  Diabetes mellitus with peripheral neuropathy.  Lantus insulin 15 units daily.  Check blood sugars before meals and at bedtime    -continue SSI   Some improvement 10/7  13.  History of CAD with CABG 2013.  Continue aspirin 14.  Hyperlipidemia.  Lipitor 15.  Acute on chronic anemia.  Continue Aranesp  Hemoglobin 8.0 on 10/5  Folow up with HD 16.  Hemorrhoids  Continue Anusol  LOS (Days) 5 A FACE TO FACE EVALUATION WAS PERFORMED  Meredith Staggers 04/20/2018 8:54 AM

## 2018-04-20 NOTE — Progress Notes (Signed)
Physical Therapy Session Note  Patient Details  Name: [name] MRN: [id number] Date of Birth: [birth year]  Today's Date: 04/20/2018 PT Individual Time: 1030-1109 PT Individual Time Calculation (min): 39 min   Short Term Goals: Week 1:  PT Short Term Goal 1 (Week 1): Patient to be able to complete functional bed mobility with min guard  PT Short Term Goal 2 (Week 1): Patient to be able to transfer to [facility] with MinA on consistent basis  PT Short Term Goal 3 (Week 1): Patient to be able to self-propel WC 46ft  PT Short Term Goal 4 (Week 1): Patient to initiate gait with LRAD   Skilled Therapeutic Interventions/Progress Updates:    pt rec'd in recliner, states she feels fatigued but is willing to participate.  Pt requires frequent rests and encouragement to participate throughout session. Pt performs sit <> stand blocked practice x 5 with min/mod A, increased time.  Gait training with RW 20' x 2 with close supervision.  Pt then incontinent of bowel. Pt mod A for sit to supine.  Min A for rolling in bed with use of bedrails for hygiene and changing clothing. Pt left in bed with needs at hand, husband present.  Therapy Documentation Precautions:  Precautions Precautions: Fall Precaution Comments: chest soreness from CPR, watch vitals  Restrictions Weight Bearing Restrictions: No Pain: No c/o pain   Therapy/Group: Individual Therapy  Kharizma Lesnick 04/20/2018, 11:09 AM

## 2018-04-20 NOTE — Progress Notes (Signed)
Physical Therapy Session Note  Patient Details  Name: [name] MRN: [id number] Date of Birth: [birth year]  Today's Date: 04/20/2018 PT Individual Time: 1419-1530 PT Individual Time Calculation (min): 71 min   Short Term Goals: Week 1:  PT Short Term Goal 1 (Week 1): Patient to be able to complete functional bed mobility with min guard  PT Short Term Goal 2 (Week 1): Patient to be able to transfer to [facility] with MinA on consistent basis  PT Short Term Goal 3 (Week 1): Patient to be able to self-propel WC 28ft  PT Short Term Goal 4 (Week 1): Patient to initiate gait with LRAD   Skilled Therapeutic Interventions/Progress Updates:  Pt received in bed reporting burning pain in buttocks but did not rate - RN made aware & therapist & RN applied cream to buttocks during session & RN administered meds. Pt agreeable to tx & transferred to EOB with supervision and hospital bed features. Therapist threaded & donned pants max assist while pt sat EOB. Pt transferred sit>stand with supervision from elevated bed & cuing to push up from bed vs pulling on RW, and pt assisted with pulling pants over hips. Therapist donned slippers total assist. Pt ambulates bed>w/c with RW and close supervision and brushes hair while sitting in w/c with set up assist. Transported pt to/from gym via w/c total assist for time management. Pt completes sit<>stand with min assist with ongoing cuing for hand placement on RW & w/c for increased safety. Pt ambulates 50 ft + 31 ft with RW & steady assist with seated rest breaks in between 2/2 fatigue (pt reports 5/10 fatigue). After 2nd gait trial pt reports incontinence of bowels and is returned to room. Pt transferred to standing at sink to allow therapist to perform peri care total assist. After pt had been cleaned up she reported incontinence of urine and found to be incontinent of bladder & a second time for bowels. Therapist provided total assist for peri hygiene and donning clean brief.  Pt doffed shirt and donned clean gown with set up assist. Pt requests to return to bed & ambulates w/c>bed with RW & steady assist and transfers to supine in bed. Pt agreeable to bed level exercises and performs the following with BLE: short arc quads, heel slides, hip adduction squeezes with hold, and hip abduction slides. At end of session pt left in bed with alarm set, call bell in reach, & family present.   Therapy Documentation Precautions:  Precautions Precautions: Fall Precaution Comments: chest soreness from CPR, watch vitals  Restrictions Weight Bearing Restrictions: No    Therapy/Group: Individual Therapy  Waunita Schooner 04/20/2018, 3:36 PM

## 2018-04-20 NOTE — Care Management (Signed)
[facility] Individual Statement of Services  Patient Name:  [name]  Date:  04/20/2018  Welcome to the [facility].  Our goal is to provide you with an individualized program based on your diagnosis and situation, designed to meet your specific needs.  With this comprehensive rehabilitation program, you will be expected to participate in at least 3 hours of rehabilitation therapies Monday-Friday, with modified therapy programming on the weekends.  Your rehabilitation program will include the following services:  Physical Therapy (PT), Occupational Therapy (OT), 24 hour per day rehabilitation nursing, Therapeutic Recreaction (TR), Neuropsychology, Case Management (Social Worker), Rehabilitation Medicine, Nutrition Services and Pharmacy Services  Weekly team conferences will be held on Tuesdays to discuss your progress.  Your Social Worker will talk with you frequently to get your input and to update you on team discussions.  Team conferences with you and your family in attendance may also be held.  Expected length of stay: 21-25 days   Overall anticipated outcome: minimal assistance  Depending on your progress and recovery, your program may change. Your Social Worker will coordinate services and will keep you informed of any changes. Your Social Worker's name and contact numbers are listed  below.  The following services may also be recommended but are not provided by the [facility] will be made to provide these services after discharge if needed.  Arrangements include referral to agencies that provide these services.  Your insurance has been verified to be:  Medicare and [place] Your primary doctor is:  Josetta Huddle  Pertinent information will be shared with your doctor and your insurance company.  Social  Worker:  [place], [place] or [phone]   Information discussed with and copy given to patient by: Lennart Pall, 04/20/2018, 2:54 PM

## 2018-04-20 NOTE — Progress Notes (Signed)
did not administer lantus resulting arrived  too late (1308)  from pharmacy to administer informed charge nurse.

## 2018-04-21 ENCOUNTER — Inpatient Hospital Stay (HOSPITAL_COMMUNITY): Payer: Medicare Other

## 2018-04-21 ENCOUNTER — Inpatient Hospital Stay (HOSPITAL_COMMUNITY): Payer: Medicare Other | Admitting: Physical Therapy

## 2018-04-21 LAB — GLUCOSE, CAPILLARY
Glucose-Capillary: 91 mg/dL (ref 70–99)
Glucose-Capillary: 144 mg/dL — ABNORMAL HIGH (ref 70–99)
Glucose-Capillary: 96 mg/dL (ref 70–99)
Glucose-Capillary: 137 mg/dL — ABNORMAL HIGH (ref 70–99)

## 2018-04-21 LAB — RENAL FUNCTION PANEL
Albumin: 2.2 g/dL — ABNORMAL LOW (ref 3.5–5.0)
Anion gap: 15 (ref 5–15)
BUN: 39 mg/dL — AB (ref 8–23)
CHLORIDE: 97 mmol/L — AB (ref 98–111)
CO2: 27 mmol/L (ref 22–32)
Calcium: 9.6 mg/dL (ref 8.9–10.3)
Creatinine, Ser: 5.56 mg/dL — ABNORMAL HIGH (ref 0.44–1.00)
GFR calc Af Amer: 8 mL/min — ABNORMAL LOW (ref >60)
GFR, EST NON AFRICAN AMERICAN: 7 mL/min — AB (ref >60)
Glucose, Bld: 198 mg/dL — ABNORMAL HIGH (ref 70–99)
POTASSIUM: 4.4 mmol/L (ref 3.5–5.1)
Phosphorus: 4 mg/dL (ref 2.5–4.6)
Sodium: 139 mmol/L (ref 135–145)

## 2018-04-21 LAB — CBC
HEMATOCRIT: 27.7 % — AB (ref 36.0–46.0)
Hemoglobin: 8.4 g/dL — ABNORMAL LOW (ref 12.0–15.0)
MCH: 32.6 pg (ref 26.0–34.0)
MCHC: 30.3 g/dL (ref 30.0–36.0)
MCV: 107.4 fL — AB (ref 80.0–100.0)
NRBC: 0 % (ref 0.0–0.2)
Platelets: 210 10*3/uL (ref 150–400)
RBC: 2.58 MIL/uL — ABNORMAL LOW (ref 3.87–5.11)
RDW: 14 % (ref 11.5–15.5)
WBC: 7.1 10*3/uL (ref 4.0–10.5)

## 2018-04-21 LAB — C DIFFICILE QUICK SCREEN W PCR REFLEX
C DIFFICILE (CDIFF) TOXIN: NEGATIVE
C DIFFICLE (CDIFF) ANTIGEN: NEGATIVE
C Diff interpretation: NOT DETECTED

## 2018-04-21 MED ORDER — HYDROCORTISONE ACETATE 25 MG RE SUPP
25.0000 mg | Freq: Two times a day (BID) | RECTAL | Status: DC
  Administered 2018-04-21 – 2018-04-28 (×15): 25 mg via RECTAL
  Filled 2018-04-21 (×15): qty 1

## 2018-04-21 MED ORDER — CALCIUM POLYCARBOPHIL 625 MG PO TABS
625.0000 mg | ORAL_TABLET | Freq: Every day | ORAL | Status: DC
  Administered 2018-04-21 – 2018-04-28 (×8): 625 mg via ORAL
  Filled 2018-04-21 (×8): qty 1

## 2018-04-21 NOTE — Procedures (Signed)
   I was present at this dialysis session, have reviewed the session itself and made  appropriate changes [name] MD [place] pager [phone]   04/21/2018, 4:47 PM

## 2018-04-21 NOTE — Plan of Care (Signed)
  Problem: Spiritual Needs Goal: Ability to function at adequate level Outcome: Progressing   Problem: Consults Goal: RH GENERAL PATIENT EDUCATION Description See Patient Education module for education specifics. Outcome: Progressing Goal: Skin Care Protocol Initiated - if Braden Score 18 or less Description If consults are not indicated, leave blank or document N/A Outcome: Progressing Goal: Nutrition Consult-if indicated Outcome: Progressing Goal: Diabetes Guidelines if Diabetic/Glucose > 140 Description If diabetic or lab glucose is > 140 mg/dl - Initiate Diabetes/Hyperglycemia Guidelines & Document Interventions  Outcome: Progressing   Problem: RH BOWEL ELIMINATION Goal: RH STG MANAGE BOWEL WITH ASSISTANCE Description STG Manage Bowel with min Assistance.  Outcome: Progressing Goal: RH STG MANAGE BOWEL W/MEDICATION W/ASSISTANCE Description STG Manage Bowel with Medication with min  Assistance.  Outcome: Progressing   Problem: RH SKIN INTEGRITY Goal: RH STG SKIN FREE OF INFECTION/BREAKDOWN Description Min A  Outcome: Progressing Goal: RH STG MAINTAIN SKIN INTEGRITY WITH ASSISTANCE Description STG Maintain Skin Integrity With min  Assistance.  Outcome: Progressing Goal: RH STG ABLE TO PERFORM INCISION/WOUND CARE W/ASSISTANCE Description STG Able To Perform Incision/Wound Care With min  Assistance.  Outcome: Progressing   Problem: RH SAFETY Goal: RH STG ADHERE TO SAFETY PRECAUTIONS W/ASSISTANCE/DEVICE Description STG Adhere to Safety Precautions With min  Assistance/Device.  Outcome: Progressing   Problem: RH PAIN MANAGEMENT Goal: RH STG PAIN MANAGED AT OR BELOW PT'S PAIN GOAL Outcome: Progressing   Problem: RH KNOWLEDGE DEFICIT GENERAL Goal: RH STG INCREASE KNOWLEDGE OF SELF CARE AFTER HOSPITALIZATION Outcome: Progressing

## 2018-04-21 NOTE — Progress Notes (Signed)
Stool sample taken to lab. [name]

## 2018-04-21 NOTE — Progress Notes (Signed)
Pt did not receive Ferric gluconate at dialysis. Called Dr Justin Mend. Ok for pt to get the med at next dialysis treatment per Dr Justin Mend. Pharmacy notified. Continue plan of care.   Magan Winnett W Khaliya Golinski

## 2018-04-21 NOTE — Progress Notes (Signed)
Physical Therapy Session Note  Patient Details  Name: [name] MRN: [id number] Date of Birth: 09-24-1940  Today's Date: 04/21/2018 PT Individual Time: 1115-1150 PT Individual Time Calculation (min): 35 min   Short Term Goals: Week 1:  PT Short Term Goal 1 (Week 1): Patient to be able to complete functional bed mobility with min guard  PT Short Term Goal 2 (Week 1): Patient to be able to transfer to [facility] with MinA on consistent basis  PT Short Term Goal 3 (Week 1): Patient to be able to self-propel WC 73ft  PT Short Term Goal 4 (Week 1): Patient to initiate gait with LRAD   Skilled Therapeutic Interventions/Progress Updates:    session focused on activity tolerance through gait training with RW.  Pt performs supine <> Sit with min A, transfers sit <> stand with min A throughout session.  Gait with RW 50', 75' x 2 with supervision in controlled environment.  Curb step negotiation as pt has 1 step up into bathroom at home. Pt able to perform with RW and CGA. Pt limited by pain throughout session, unable to complete last 10 minutes of treatment due to pain. Pt left in sidelying for decreased pain on bottom.  Pt left with needs at hand, husband present.  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) General: PT Amount of Missed Time (min): 10 Minutes PT Missed Treatment Reason: Patient fatigue;Pain(hemorrhoid pain) Pain: Pt c/o 8/10 pain in bottom due to hemorrhoids, repositioned and rest during session.   Therapy/Group: Individual Therapy  Kaybree Williams 04/21/2018, 11:55 AM

## 2018-04-21 NOTE — Progress Notes (Signed)
Spoke with gastroenterology in regards to C. difficile which has been treated vancomycin completed FiberCon and Florastor ongoing.  Still consistency stool mushy and frequent.  Recommendations by GI were to repeat C. difficile testing

## 2018-04-21 NOTE — Progress Notes (Signed)
[facility] PHYSICAL MEDICINE [facility] REHABILITATION     PROGRESS NOTE  Subjective/Complaints:  After a good day Sunday, struggling again today with stools and hemorrhoids. Creams not helping.   Objective: Vital Signs: Blood pressure (!) 171/51, pulse 94, temperature 98.3 F (36.8 C), temperature source Oral, resp. rate 18, height 5\' 5"  (1.651 m), weight 73 kg, SpO2 97 %. No results found. Recent Labs    04/18/18 1307  WBC 6.7  HGB 8.0*  HCT 26.2*  PLT 168   Recent Labs    04/18/18 1308  NA 134*  K 4.3  CL 95*  GLUCOSE 231*  BUN 30*  CREATININE 4.55*  CALCIUM 9.0   CBG (last 3)  Recent Labs    04/20/18 1702 04/20/18 2132 04/21/18 0611  GLUCAP 155* 112* 91    Wt Readings from Last 3 Encounters:  04/21/18 73 kg  04/15/18 72.8 kg  01/28/18 77.1 kg    Physical Exam:  BP (!) 171/51 (BP Location: Right Arm) Comment: RN Notifed  Pulse 94   Temp 98.3 F (36.8 C) (Oral)   Resp 18   Ht 5\' 5"  (1.651 m)   Wt 73 kg   SpO2 97%   BMI 26.78 kg/m  Constitutional: No distress . Vital signs reviewed. HEENT: EOMI, oral membranes moist Neck: supple Cardiovascular: RRR without murmur. No JVD    Respiratory: CTA Bilaterally without wheezes or rales. Normal effort    GI: BS +, sl tender, non-distended  Musculoskeletal: Generalized edema, unchanged Neurological: She is alert.  A&O.  HOH Some delays in processing Motor: 4/5 grossly throughout Skin: Skin is warm and dry.   Psychiatric: quiet , fatigued  Assessment/Plan: 1. Functional deficits secondary to debility which require 3+ hours per day of interdisciplinary therapy in a comprehensive inpatient rehab setting. Physiatrist is providing close team supervision and 24 hour management of active medical problems listed below. Physiatrist and rehab team continue to assess barriers to discharge/monitor patient progress toward functional and medical goals.  Function:  Bathing Bathing position      Bathing parts       Bathing assist        Upper Body Dressing/Undressing Upper body dressing                    Upper body assist        Lower Body Dressing/Undressing Lower body dressing                                  Lower body assist        Toileting Toileting          Toileting assist     Transfers Chair/bed transfer             Locomotion Ambulation           Wheelchair          Cognition Comprehension    Expression    Social Interaction    Problem Solving    Memory      Medical Problem List and Plan: 1.  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  DVT Prophylaxis/Anticoagulation: Subcutaneous heparin.  Monitor for any bleeding episodes 3. Pain Management: Tylenol as needed 4. Mood: Provide emotional support.  Off of Celexa since hospitalization.  Avoiding QT prolonging agents 5. Neuropsych: This patient is ?fully capable of making  decisions on her own behalf. 6. Skin/Wound Care: Routine skin checks  -continue local care to buttocks to keep clean/dry given diarrhea 7. Fluids/Electrolytes/Nutrition: improved PO intake (50-75%) 8.  Hypertension.  Hydralazine 25 mg every 8 hours  -increased over last 24+ hours. No change yet until more consistently elevated  Monitor with increased mobility 9.  Nonsustained V. Tach/bradycardia.  Patient is asymptomatic.  Avoid QT prolonging agents.  Follow-up per cardiology services.  Patient is DNR 10.  C. difficile.  Flexi-Seal discontinued.  Contact precautions.    Vancomycin completed  Stool consistency better but stools still mushy, frequent  Continue florastor, add fiber  Encourage PO  ?GI consult 11.  End-stage renal disease with hemodialysis.  Hemodialysis recently initiated 3 to 4 weeks ago.    -HD in afternoons after therapy  -electrolyte/volume mgt per nephrology 12.  Diabetes mellitus with peripheral neuropathy.   Lantus insulin 15 units daily.  Check blood sugars before meals and at bedtime    -continue SSI   Some improvement 10/7  13.  History of CAD with CABG 2013.  Continue aspirin 14.  Hyperlipidemia.  Lipitor 15.  Acute on chronic anemia.  Continue Aranesp  Hemoglobin 8.0 on 10/5  Folow up with HD 16.  Hemorrhoids  Continue Anusol  -try suppository  -no Sitz bath due to moisture irritated skin  LOS (Days) 6 A FACE TO FACE EVALUATION WAS PERFORMED  Meredith Staggers 04/21/2018 8:40 AM

## 2018-04-21 NOTE — Progress Notes (Signed)
Patient verbalize discomfort of agonizing rectal pain from external protruding hemorroids and rectal irritation from episodes of loose stools., States current medication and creams are not working nor relieving her pain. Repositioned and turned,with comfort measures and apin medication provided  Monitor and assisted will speak with medical team this morning regarding these concerns

## 2018-04-21 NOTE — Patient Care Conference (Signed)
Inpatient RehabilitationTeam Conference and Plan of Care Update Date: 04/21/2018   Time: 2:40 PM    Patient Name: [name]      Medical Record Number: [id number]  Date of Birth: 08-14-1940 Sex: Female         Room/Bed: 4W05C/4W05C-01 Payor Info: Payor: MEDICARE / Plan: MEDICARE PART A AND B / Product Type: *No Product type* /    Admitting Diagnosis: vfib arrest esrd  Admit Date/Time:  04/15/2018  3:41 PM Admission Comments: No comment available   Primary Diagnosis:  <principal problem not specified> Principal Problem: <principal problem not specified>  Patient Active Problem List   Diagnosis Date Noted  . Hemorrhoids   . Diabetes mellitus type 2 in nonobese (HCC)   . Anemia, chronic disease   . Labile blood pressure   . Poorly controlled type 2 diabetes mellitus with peripheral neuropathy ([place])   . Debility   . Benign essential HTN   . Bradycardia   . Nonsustained ventricular tachycardia ([place])   . Enteritis due to Clostridium difficile   . ESRD on dialysis ([place])   . Diabetic peripheral neuropathy ([place])   . Acute blood loss anemia   . Anemia of chronic disease   . Aspiration into airway   . NSTEMI (non-ST elevated myocardial infarction) ([place])   . QT prolongation 04/03/2018  . Acute respiratory failure with hypoxia ([place])   . Aspiration pneumonia due to gastric secretions ([place])   . Cardiac arrest ([place]) 04/02/2018  . Anxiety 05/13/2017  . Panic attack 05/13/2017  . SAH (subarachnoid hemorrhage) ([place]) 02/03/2017  . Fall 02/03/2017  . Spinal stenosis, lumbar region 03/02/2016  . Trochanteric bursitis of left hip 01/03/2016  . Generalized weakness 12/21/2015  . Acute hypokalemia 12/21/2015  . Lumbar back pain with radiculopathy affecting left lower extremity 12/04/2015  . Coronary artery disease involving native coronary artery of native heart without angina pectoris   . CRI (chronic renal insufficiency), stage 4 (severe) (HCC)   . Urinary retention   . Anemia of  chronic kidney failure   . Hx of gout   . Thrombocytopenia ([place])   . S/P lumbar discectomy 12/01/2015  . 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 Acute encephalopathy   . Chronic kidney disease (CKD), stage V ([place]) 07/13/2015  . Glaucoma   . Atrial fibrillation ([place])   . CAD (coronary artery disease)   . Depression   . Stroke ([place])   . Diabetes mellitus without complication ([place])   . Gastroesophageal reflux disease with esophagitis   . Carotid artery stenosis 04/21/2015  . Essential hypertension 06/08/2014  . Hyperlipidemia 06/08/2014  . Occlusion and stenosis of carotid artery without mention of cerebral infarction 01/01/2012    Expected Discharge Date: Expected Discharge Date: 04/28/18  Team Members Present: Physician leading conference: Dr. Alger Simons Social Worker Present: Lennart Pall, LCSW Nurse Present: Dwaine Gale, RN PT Present: Roderic Ovens, PT OT Present: Other (comment)(Sandra Rosana Hoes, OT) SLP Present: Weston Anna, SLP PPS Coordinator present : Daiva Nakayama, RN, CRRN     Current Status/Progress Goal Weekly Team Focus  Medical   admitted for debility after multiple medical. ongoing diarrhea, c diff related. severe pain related to hemorrhoids also  improve stool consistency  GI involvement, adjusting diet and supplements, re-sample stool   Bowel/Bladder   Incontinent of Bowel/Bladder , Remain of Enteric Pecautions -C-Diff continue to have several loose stool throughout shift and 24hrs  (P) mod assist, restore  continence and Normal bowel patterns  Maintain Enteric Precaution with monitoring , educate patient and family on Restriction, monitor for    Swallow/Nutrition/ Hydration             ADL's   Set up UB, mod A LB, min/mod A ADL transfers  (S)  functional activity tolerance, dynamic/static standing balance   Mobility   supervision/min A gait and transfers with RW  supervision  overall  activity tolerance, family ed, d/c planning   Communication             Safety/Cognition/ Behavioral Observations            Pain   (P) c/o rectalpain secondary to external hemorrhoids and rectal irrtiation for sttool, Prn Tylenol 650mg  Does Not relieve patient pain nor foes Perforation ceeam or Gerhaardt dream,  (P) <2 pain mod assist  (P) Assess qs/prn with repositioning, Notify MD/PA of request for additional medications or treatment ,    Skin   (P) Echchymotic areas to abdomen, small abrasiion to facial area healing well, increase redness irritation to rectal area with external hemorroids          Rehab Goals Patient on target to meet rehab goals: Yes *See Care Plan and progress notes for long and short-term goals.     Barriers to Discharge  Current Status/Progress Possible Resolutions Date Resolved   Physician    Medical stability        rx loose stool      Nursing                  PT                    OT Other (comments)  Frequent BM's and pain associated with             SLP                SW                Discharge Planning/Teaching Needs:  Plan to d/c home with spouse as primary caregiver. Daughter and her family living next door and able to provide substantial assistance as well.  Teaching still to be scheduled.   Team Discussion:  Multiple medical issues;  +cdiff on CIR admit and now retesting as may need additional medication.  Pain in bottom is VERY limiting and easily fatigues.  Doing well with therapies overall and on track to meet supervision goals.    Revisions to Treatment Plan:  NA    Continued Need for Acute Rehabilitation Level of Care: The patient requires daily medical management by a physician with specialized training in physical medicine and rehabilitation for the following conditions: Daily direction of a multidisciplinary physical rehabilitation program to ensure safe treatment while eliciting the highest outcome that is of practical  value to the patient.: Yes Daily medical management of patient stability for increased activity during participation in an intensive rehabilitation regime.: Yes Daily analysis of laboratory values and/or radiology reports with any subsequent need for medication adjustment of medical intervention for : Other;Nutritional problems   I attest that I was present, lead the team conference, and concur with the assessment and plan of the team.   Annamarie Yamaguchi 04/22/2018, 9:20 AM

## 2018-04-21 NOTE — Progress Notes (Signed)
Occupational Therapy Session Note  Patient Details  Name: [name] MRN: [id number] Date of Birth: 06-11-1941  Today's Date: 04/21/2018 OT Individual Time: 0830-1000 OT Individual Time Calculation (min): 90 min    Short Term Goals: Week 1:  OT Short Term Goal 1 (Week 1): Pt will don shirt EOB with CGA OT Short Term Goal 2 (Week 1): Pt will transfer to [facility] with min A OT Short Term Goal 3 (Week 1): Pt will don pants with mod A  OT Short Term Goal 4 (Week 1): Pt will complete peri hygiene following toileting with mod A   Skilled Therapeutic Interventions/Progress Updates:    Session focused on b/d tasks and functional activity tolerance. Improvements noted this session include standing level posterior peri hygiene and LB dressing. With cueing for use of anterior grab bars pt able to unimanually wash buttocks standing in shower. Pt completed functional mobility in and out of shower with CGA, ~8 ft each way with RW. Pt sat EOB and threaded B LE through pants, however was now very limited by fatigue, although all VSS. Pt required frequent prolonged rest breaks with heavy deep breathing. Edu provided re rest breaks, activity pacing, and fall prevention. Throughout session pt had frequent, painful and incontinent BMs. Pt reported rectum pain relief with warm water in shower. Pt returned supine in bed for brief change and RN care, rolling R and L with min A. Pt transferred to w/c and completed oral care at sink with set up. Alerted RN to skin breakdown under abdomen tissue folds, with anti fungal powder applied. Pt left sitting up in w/c with all needs met and husband present.   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)     Pain: Pain Assessment Pain Scale: 0-10 Pain Score: 6  Faces Pain Scale: Hurts little more Pain Type: Acute pain Pain  Location: Rectum Pain Orientation: Posterior Pain Descriptors / Indicators: Aching;Burning Pain Onset: On-going Pain Intervention(s): Shower ADL: ADL Eating: Set up Where Assessed-Eating: Bed level Grooming: Supervision/safety Where Assessed-Grooming: Sitting at sink Upper Body Bathing: Setup Where Assessed-Upper Body Bathing: Shower Lower Body Bathing: Minimal cueing, Minimal assistance Where Assessed-Lower Body Bathing: Shower Upper Body Dressing: Setup Where Assessed-Upper Body Dressing: Edge of bed Lower Body Dressing: Minimal assistance, Minimal cueing Where Assessed-Lower Body Dressing: Edge of bed Toileting: Minimal assistance Where Assessed-Toileting: Glass blower/designer: Moderate assistance Toilet Transfer Method: Counselling psychologist: Energy manager: Unable to assess Social research officer, government: Minimal assistance, Minimal cueing Social research officer, government Method: Heritage manager: Other (comment)(drop arm BSC)   Therapy/Group: Individual Therapy  Curtis Sites 04/21/2018, 11:00 AM

## 2018-04-21 NOTE — Progress Notes (Signed)
Physical Therapy Session Note  Patient Details  Name: [name] MRN: [id number] Date of Birth: 27-Nov-1940  Today's Date: 04/21/2018 PT Individual Time: 1015-1055 PT Individual Time Calculation (min): 40 min   Short Term Goals: Week 1:  PT Short Term Goal 1 (Week 1): Patient to be able to complete functional bed mobility with min guard  PT Short Term Goal 2 (Week 1): Patient to be able to transfer to [facility] with MinA on consistent basis  PT Short Term Goal 3 (Week 1): Patient to be able to self-propel WC 40ft  PT Short Term Goal 4 (Week 1): Patient to initiate gait with LRAD   Skilled Therapeutic Interventions/Progress Updates:    Pt seated in w/c upon PT arrival, agreeable to therapy tx and reports 8/10 pain in buttocks region from hemorrhoids.Pt requesting to get back in bed, therapist encouraged pt to work on some standing first, pt agreeable. Pt performed sit<>stand from w/c with RW and min assist, pt able to remain standing x 60 sec while engaging in coversation with max encouragement to continue standing. Pt reports being incontinent of bladder. Pt performed sit<>stand with RW and min assist in order to doff dirty briefs, pt able to maintain balance with single UE support while assisting with clothing management. Pt seated to don clean briefs and pants, sit<>stand with RW and min assist to pull briefs/pants over hips. Pt performed stand pivot back to bed with min assist and use of bed rails. Pt transferred sit>supine>sidelying with min assist. Assisted pt into sidelying position in order to take pressure of buttocks for pain relief. Therapist also provided pt with roho cushion for the next time she is up in w/c for increased pressure relief and pain management.   Therapy Documentation Precautions:  Precautions Precautions: Fall Precaution Comments: chest soreness from CPR, watch vitals  Restrictions Weight Bearing Restrictions: No    Therapy/Group: Individual Therapy  Netta Corrigan, PT, DPT 04/21/2018, 7:58 AM

## 2018-04-22 ENCOUNTER — Inpatient Hospital Stay (HOSPITAL_COMMUNITY): Payer: Medicare Other | Admitting: Physical Therapy

## 2018-04-22 ENCOUNTER — Inpatient Hospital Stay (HOSPITAL_COMMUNITY): Payer: Medicare Other

## 2018-04-22 ENCOUNTER — Encounter (HOSPITAL_COMMUNITY): Payer: Medicare Other | Admitting: Psychology

## 2018-04-22 LAB — GLUCOSE, CAPILLARY
GLUCOSE-CAPILLARY: 86 mg/dL (ref 70–99)
GLUCOSE-CAPILLARY: 93 mg/dL (ref 70–99)
GLUCOSE-CAPILLARY: 128 mg/dL — AB (ref 70–99)
Glucose-Capillary: 161 mg/dL — ABNORMAL HIGH (ref 70–99)

## 2018-04-22 MED ORDER — DARBEPOETIN ALFA 150 MCG/0.3ML IJ SOSY
150.0000 ug | PREFILLED_SYRINGE | INTRAMUSCULAR | Status: DC
  Administered 2018-04-23: 150 ug via INTRAVENOUS
  Filled 2018-04-22: qty 0.3

## 2018-04-22 MED ORDER — CHLORHEXIDINE GLUCONATE CLOTH 2 % EX PADS: 6.0000 | MEDICATED_PAD | Freq: Every day | CUTANEOUS | Status: DC

## 2018-04-22 NOTE — Progress Notes (Signed)
Physical Therapy Session Note  Patient Details  Name: [name] MRN: [id number] Date of Birth: 08-17-1940  Today's Date: 04/22/2018 PT Individual Time: 1300-1415 PT Individual Time Calculation (min): 75 min   Short Term Goals: Week 1:  PT Short Term Goal 1 (Week 1): Patient to be able to complete functional bed mobility with min guard  PT Short Term Goal 2 (Week 1): Patient to be able to transfer to [facility] with MinA on consistent basis  PT Short Term Goal 3 (Week 1): Patient to be able to self-propel WC 75ft  PT Short Term Goal 4 (Week 1): Patient to initiate gait with LRAD   Skilled Therapeutic Interventions/Progress Updates:    Pt received seated in w/c in room, agreeable to PT. Pt reports soreness on her bottom due to frequency of liquid stool. Pt reports she feels she has been incontinent. Sit to stand with min A to grab bar in bathroom. Standing balance with Supervision for dependent brief change and pericare due to urinary incontinence. Ambulation 2 x 100 ft with RW and Supervision. Pt reports feeling SOA following gait, SpO2 100% and HR 81. Pt's breathing improves with seated rest break. Sit to stand 4 x 5 reps from progressively lower surface to RW with Supervision increasing to CGA with decrease in surface height. Nustep level 3 x 5 min with B UE/LE for global endurance. Manual w/c propulsion x 100 ft with BUE for global endurance. Pt left seated in w/c in room with needs in reach and family present.  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)   Therapy/Group: Individual Therapy  Excell Seltzer, PT, DPT  04/22/2018, 3:45 PM

## 2018-04-22 NOTE — Consult Note (Signed)
Neuropsychological Consultation   Patient:   [name]   DOB:   05-04-1941  MR Number:  [id number]  Location:  [facility] [id number] [place] [address] Dept: [phone] Loc: [phone]           Date of Service:   04/22/2018  Start Time:   11 AM End Time:   12 PM  Provider/Observer:  Ilean Skill, Psy.D.       Clinical Neuropsychologist       Billing Code/Service: [phone] Units  Chief Complaint:    [name] is a 77 year old female with history of IBS, ESRD with hemodialysis, Afib, CAB with CABG 2013, left carotid enterectomy, hypertension, diabetes and history of lumbar radiculopathy.  Presented on 04/02/2018 after cardiac arrest.  CPR initiated by granddaughters prior to first responders.  Patient underwent successful stenting.  Patient was intubated until 04/04/2018.  Patient has continued with debility and issues with coping and adjusting as well as fatigue.    Reason for Service:  HPI: [name]. [place] is a 77 year old right-handed female history of irritable bowel syndrome, end-stage renal disease with hemodialysis recently initiated approximately 3 to 4 weeks ago, atrial fibrillation, CAD with CABG 2013 maintained on aspirin, left carotid enterectomy, hypertension, diabetes mellitus as well as history of lumbar radiculopathy with L5-S1 microdiscectomy receiving inpatient rehab services May 2017.  Per chart review and family, lives with spouse.  Independent with assistive device prior to admission.  Daughter lives next door.  2 level home with bed and bath on main level.  Presented 04/02/2018 after witnessed out of hospital cardiac arrest.  CPR initiated.  EMS arrived within 10 minutes found in V. fib and intubated in the field.  Noted systolic blood pressure in the 220s.  Troponin 1.17, lactic acid 5.11, WBC 18,900.  Placed on heparin drip.  Echocardiogram with ejection fraction  of 50% akinesis of the basal mid inferior lateral and inferior myocardium grade 1 diastolic dysfunction.  Placed on broad-spectrum antibiotics for elevated WBC question aspiration pneumonia.  Cardiology follow-up cardiac catheterization 04/08/2018 that showed proximal LCx lesion 100% stenosis in proximal LAD 95% stenosis.  Patient underwent successful stenting per Dr.Varanasi and maintained on aspirin and Brilinta therapy.  Hemodialysis ongoing as per renal services.  Hospital course positive C. difficile with Flexi-Seal placed contact precautions completing course of vancomycin for C. difficile.  Subcutaneous heparin for DVT prophylaxis.  Patient did remain intubated through 04/04/2018.  Patient with episode of bradycardia 04/10/2018 and remained asymptomatic with follow-up per cardiology services with EKG showing P-P prolongation noted while coughing and also episode while sleeping. Beta-blocker was discontinued.   Therapy evaluations completed with recommendations of physical medicine rehab consult.  Patient was admitted for a comprehensive rehab program.  Current Status:  The was sleepy and lethargic during visit today.  Family helped assist her in answering some questions and the patient keep reporting how tired and fatigued she was.  THe patient reported that depressive symptoms are present and she worries that her functioning status will not improve even though she can see functional gains already in therapy.  While it was hard to assess due to [place], the patient is likely experiencing exacerbation of her pre-existing depression.   Behavioral Observation: [name]  presents as a 77 y.o.-year-old Right Caucasian Female who appeared her stated age. her dress was Appropriate and she was Well Groomed and her manners were Appropriate to  the situation.  her participation was indicative of Appropriate and Drowsy behaviors.  There were any physical disabilities noted.  she displayed an appropriate level  of cooperation and motivation.     Interactions:    Minimal Appropriate, Drowsy and Redirectable  Attention:   abnormal and attention span appeared shorter than expected for age  Memory:   abnormal; remote memory intact, recent memory impaired  Visuo-spatial:  not examined  Speech (Volume):  low  Speech:   normal; normal  Thought Process:  Coherent and Relevant  Though Content:  WNL; not suicidal and not homicidal  Orientation:   person, place and time/date  Judgment:   Fair  Planning:   Fair  Affect:    Flat and Lethargic  Mood:    Dysphoric  Insight:   Fair  Intelligence:   normal  Medical History:   Past Medical History:  Diagnosis Date  . Anemia   . Arthritis   . Atrial fibrillation ([place])   . Blood transfusion   . CAD (coronary artery disease)   . Cancer ([place])    .  top of head- melonoma  . Carotid artery occlusion    Carotid Endartectom,y - left 2009.  Blockage Right being watched by Dr Scot Dock.  . Carotid stenosis   . 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 Complication of anesthesia    pt. states that she was difficult to wake  . Depression   . Diabetes mellitus without complication ([place])   . Dysrhythmia   . General weakness 12/2015  . GERD (gastroesophageal reflux disease)   . Glaucoma   . History of hiatal hernia   . History of kidney stones    passed  . History of pneumonia   . HOH (hard of hearing)   . Hypertension   . Hypokalemia 12/2015  . Myocardial infarction ([place])   . Neuromuscular disorder ([place])    CARPEL TUNNEL  . Pneumonia   . Shortness of breath   . Stroke ([place])    hx of TIA       Psychiatric History:  The patient does have past history of depression and the current deficits are likely exacerbating these features.  Family Med/Psych History:  Family History  Problem Relation Age of Onset  . Diabetes Mother   . Hypertension Mother   . Heart disease Mother        beofre age 53  . Heart attack Mother   .  Stroke Mother   . Cancer Father   . Hyperlipidemia Father   . Hypertension Father   . Deep vein thrombosis Daughter   . Diabetes Daughter   . Hyperlipidemia Daughter   . Hypertension Daughter   . Heart disease Daughter   . Peripheral vascular disease Daughter   . Breast cancer Neg Hx     Risk of Suicide/Violence: virtually non-existent   Impression/DX:  Stormey Wilborn is a 77 year old female with history of IBS, ESRD with hemodialysis, Afib, CAB with CABG 2013, left carotid enterectomy, hypertension, diabetes and history of lumbar radiculopathy.  Presented on 04/02/2018 after cardiac arrest.  CPR initiated by granddaughters prior to first responders.  Patient underwent successful stenting.  Patient was intubated until 04/04/2018.  Patient has continued with debility and issues with coping and adjusting as well as fatigue.    The was sleepy and lethargic during visit today.  Family helped assist her in answering some questions and the patient keep reporting how tired and fatigued  she was.  THe patient reported that depressive symptoms are present and she worries that her functioning status will not improve even though she can see functional gains already in therapy.  While it was hard to assess due to [place], the patient is likely experiencing exacerbation of her pre-existing depression.  Disposition/Plan:  Will need to monitor depression past discharge as this is likely to negatively impact outpatient rehab efforts.          Electronically Signed   _______________________ Ilean Skill, Psy.D.

## 2018-04-22 NOTE — Progress Notes (Signed)
[facility] PHYSICAL MEDICINE & REHABILITATION     PROGRESS NOTE  Subjective/Complaints:  Feeling better this morning. Bottom is still sore, but in good spirits. eating breakfast.  ROS: Patient denies fever, rash, sore throat, blurred vision, nausea, vomiting,   cough, shortness of breath or chest pain, joint or back pain, headache, or mood change.   Objective: Vital Signs: Blood pressure (!) 96/33, pulse 64, temperature 98.6 F (37 C), temperature source Oral, resp. rate 18, height 5\' 5"  (1.651 m), weight 73 kg, SpO2 98 %. No results found. Recent Labs    04/21/18 1709  WBC 7.1  HGB 8.4*  HCT 27.7*  PLT 210   Recent Labs    04/21/18 1710  NA 139  K 4.4  CL 97*  GLUCOSE 198*  BUN 39*  CREATININE 5.56*  CALCIUM 9.6   CBG (last 3)  Recent Labs    04/21/18 1820 04/21/18 2131 04/22/18 0604  GLUCAP 96 137* 86    Wt Readings from Last 3 Encounters:  04/22/18 73 kg  04/15/18 72.8 kg  01/28/18 77.1 kg    Physical Exam:  BP (!) 96/33 (BP Location: Right Arm) Comment: RN Notified  Pulse 64   Temp 98.6 F (37 C) (Oral)   Resp 18   Ht 5\' 5"  (1.651 m)   Wt 73 kg   SpO2 98%   BMI 26.78 kg/m  Constitutional: No distress . Vital signs reviewed. HEENT: EOMI, oral membranes moist Neck: supple Cardiovascular: RRR without murmur. No JVD    Respiratory: CTA Bilaterally without wheezes or rales. Normal effort    GI: BS +, non-tender, non-distended  Musculoskeletal: Generalized edema improved Neurological: She is alert.  A&O x 3.  Motor: 4/5 grossly throughout Skin: Skin is warm and dry.  Buttocks not visualized today Psychiatric: pleasant and cooperative  Assessment/Plan: 1. Functional deficits secondary to debility which require 3+ hours per day of interdisciplinary therapy in a comprehensive inpatient rehab setting. Physiatrist is providing close team supervision and 24 hour management of active medical problems listed below. Physiatrist and rehab team continue to  assess barriers to discharge/monitor patient progress toward functional and medical goals.  Function:  Bathing Bathing position      Bathing parts      Bathing assist        Upper Body Dressing/Undressing Upper body dressing                    Upper body assist        Lower Body Dressing/Undressing Lower body dressing                                  Lower body assist        Toileting Toileting          Toileting assist     Transfers Chair/bed transfer             Locomotion Ambulation           Wheelchair          Cognition Comprehension    Expression    Social Interaction    Problem Solving    Memory      Medical Problem List and Plan: 1.  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  DVT Prophylaxis/Anticoagulation: Subcutaneous heparin.  Monitor for any bleeding episodes 3. Pain Management:  Tylenol as needed 4. Mood: Provide emotional support.  Off of Celexa since hospitalization.  Avoiding QT prolonging agents 5. Neuropsych: This patient is ?fully capable of making decisions on her own behalf. 6. Skin/Wound Care: Routine skin checks  -continue local care to buttocks to keep clean/dry given diarrhea 7. Fluids/Electrolytes/Nutrition: improved PO intake (50-75%) 8.  Hypertension.  Hydralazine 25 mg every 8 hours  -improved after HD 9.  Nonsustained V. Tach/bradycardia.  Patient is asymptomatic.  Avoid QT prolonging agents.  Follow-up per cardiology services.  Patient is DNR 10.  C. difficile.  Flexi-Seal discontinued.  Contact precautions.    Vancomycin completed  Stool consistency better but stools remain mushy, frequent  Continue florastor, added fiber  Encourage PO   -spoke with GI yesterday who agrees with what we're doing   -followup C diff sample negative 11.  End-stage renal disease with hemodialysis.  Hemodialysis  recently initiated 3 to 4 weeks ago.    -HD in afternoons after therapy. T TH S  -electrolyte/volume mgt per nephrology 12.  Diabetes mellitus with peripheral neuropathy.  Lantus insulin 15 units daily.  Check blood sugars before meals and at bedtime    -continue SSI   -improved 10/9  13.  History of CAD with CABG 2013.  Continue aspirin 14.  Hyperlipidemia.  Lipitor 15.  Acute on chronic anemia.  Continue Aranesp  Hemoglobin 8.0 on 10/5  Folow up with HD 16.  Hemorrhoids     -a little better results with anusol suppository  -no Sitz bath due to moisture irritated skin  LOS (Days) 7 A FACE TO FACE EVALUATION WAS PERFORMED  Meredith Staggers 04/22/2018 8:31 AM

## 2018-04-22 NOTE — Progress Notes (Signed)
Occupational Therapy Session Note  Patient Details  Name: [name] MRN: [id number] Date of Birth: 04/01/1941  Today's Date: 04/22/2018 OT Individual Time: 9528-4132 OT Individual Time Calculation (min): 60 min    Short Term Goals: Week 1:  OT Short Term Goal 1 (Week 1): Pt will don shirt EOB with CGA OT Short Term Goal 2 (Week 1): Pt will transfer to [facility] with min A OT Short Term Goal 3 (Week 1): Pt will don pants with mod A  OT Short Term Goal 4 (Week 1): Pt will complete peri hygiene following toileting with mod A  Skilled Therapeutic Interventions/Progress Updates:    Great improvements overall in b/d tasks this session! Pt completed functional mobility into bathroom with RW at [facility] level and transferred onto [facility]. Discussed potential need for additional AE/AD with husband and daughter present. Pt required manual cues for distal LE washing, d/t inability to reach far enough in small shower. Pt transferred back to EOB and donned pants with CGA! Pt able to complete all grooming tasks with distant (S) sitting at sink. Discussed skin integrity and healing on buttocks wound- RN instructed application of cream. Pt required frequent rest breaks following shower, with all VSS. Pt left sitting up in w/c with pressure relief cushion and family present.   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)   Pain: Pain Assessment Pain Scale: 0-10 Pain Score: 5  Pain Type: Acute pain Pain Location: Rib cage Pain Orientation: Right;Left Pain Descriptors / Indicators: Aching Pain Frequency: Intermittent Pain Onset: Gradual Pain Intervention(s): Medication (See eMAR) ADL: ADL Eating: Set up Where Assessed-Eating: Bed level Grooming: Supervision/safety Where Assessed-Grooming: Sitting at sink Upper Body Bathing: Setup Where Assessed-Upper Body  Bathing: Shower Lower Body Bathing: Minimal cueing, Minimal assistance Where Assessed-Lower Body Bathing: Shower Upper Body Dressing: Setup Where Assessed-Upper Body Dressing: Edge of bed Lower Body Dressing: Minimal cueing, Contact guard Where Assessed-Lower Body Dressing: Edge of bed Toileting: Contact guard Where Assessed-Toileting: Glass blower/designer: Therapist, music Method: Counselling psychologist: Energy manager: Unable to English as a second language teacher: Minimal cueing, Curator Method: Heritage manager: Other (comment)(BSC)   Therapy/Group: Individual Therapy  Curtis Sites 04/22/2018, 11:18 AM

## 2018-04-22 NOTE — Progress Notes (Signed)
Physical Therapy Session Note  Patient Details  Name: [name] MRN: [id number] Date of Birth: 09-24-1940  Today's Date: 04/22/2018 PT Individual Time: 0805-0900 PT Individual Time Calculation (min): 55 min   Short Term Goals: Week 1:  PT Short Term Goal 1 (Week 1): Patient to be able to complete functional bed mobility with min guard  PT Short Term Goal 2 (Week 1): Patient to be able to transfer to [facility] with MinA on consistent basis  PT Short Term Goal 3 (Week 1): Patient to be able to self-propel WC 71f  PT Short Term Goal 4 (Week 1): Patient to initiate gait with LRAD   Skilled Therapeutic Interventions/Progress Updates:   Pt received supine in bed and agreeable to PT. Supine>sit transfer with supervision assist with HOB elevated.   Sit<>stand EOB to done pants with supervision assist into standing and min assist for clothing management. Pt performed sit<>stand throughout the rest of PT treatment with supervision assist and BUE support on Arm rests and RW.   Pt performed self care tasks at sink with while sitting and standing supervision assist from PT. RN present for medication management during self care tasks.   Gait training with RW x [id number] supervision assist from PT. Min cues for AD management from PT throughout for improved safety.   PT instructed pt in modified [place] strengthening program, level A. Supervision assist overall with with intermittent CGA for improved safety and tacticle cues for technique.   Pt returned to room and performed stand pivot transfer to bed with supervision assist. Sit>supine completed with mod assist to manage BLE per pt request. Ptleft supine in bed with call bell in reach and all needs met.           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)   Pain: Pain Assessment Pain Scale:  0-10 Pain Score: 5  Pain Type: Acute pain Pain Location: Rib cage Pain Orientation: Right;Left Pain Descriptors / Indicators: Aching Pain Frequency: Intermittent Pain Onset: Gradual Pain Intervention(s): Medication (See eMAR)    Therapy/Group: Individual Therapy  AuLorie Phenix0/03/2018, 11:56 AM

## 2018-04-22 NOTE — Progress Notes (Signed)
Social Work Patient ID: [name], female   DOB: Dec 20, 1940, 77 y.o.   MRN: [id number]  Met with pt, spouse and daughter today to review team conference.  All aware of targeted d/c date of 10/15 and supervision goals overall.  Spouse and daughter openly report that they feel this d/c date is "too quick" and are very concerned that pt "...will do stuff for your therapists but when we get home she won't do anything...".  We discussed need for valid reasoning to extend LOS and, if pt is meeting goals, then we don't really have that justification.  Stressed to all that pt's mobility at home is essential to her remaining mobile and independent.  Pt states, "I'm listening but I'm just so tired."  Daughter frustrated with pt as we talk when she keeps closes her eyes and is not engaged in discussion.  Daughter also very concerned about the pt's skin/ bottom and "still has leakage".  Will alert MD/PA to this concern and will discuss pt's progress with team to see if any extension is warranted at all.    Hennie Gosa, LCSW

## 2018-04-22 NOTE — Progress Notes (Addendum)
[name] Progress Note   Subjective:   Just returned from PT.  Feeling tired.  No new complaints.  Objective Vitals:   04/21/18 1700 04/21/18 1714 04/21/18 1928 04/22/18 0617  BP: (!) 116/30 (!) 121/55 (!) 130/45 (!) 96/33  Pulse: 94 94 (!) 101 64  Resp:  14 18 18   Temp:  98.7 F (37.1 C) 98.6 F (37 C) 98.6 F (37 C)  TempSrc:  Oral  Oral  SpO2:  97% 94% 98%  Weight:  73.3 kg  73 kg  Height:       Physical Exam General:NAD, elderly, obese female sitting in wheelchair. Heart:RRR, no MRG Lungs:CTAB, nml WOB Abdomen:soft, NTND Extremities:no edema Dialysis Access: LU AVG +b/t  Filed Weights   04/21/18 1323 04/21/18 1714 04/22/18 0617  Weight: 74.3 kg 73.3 kg 73 kg    Intake/Output Summary (Last 24 hours) at 04/22/2018 1320 Last data filed at 04/22/2018 1232 Gross per 24 hour  Intake 280 ml  Output 917 ml  Net -637 ml    Additional Objective Labs: Basic Metabolic Panel: Recent Labs  Lab 04/16/18 1409 04/18/18 1308 04/21/18 1710  NA 135 134* 139  K 3.5 4.3 4.4  CL 95* 95* 97*  CO2 24 27 27   GLUCOSE 226* 231* 198*  BUN 41* 30* 39*  CREATININE 5.46* 4.55* 5.56*  CALCIUM 8.7* 9.0 9.6  PHOS 5.3* 4.9* 4.0   Liver Function Tests: Recent Labs  Lab 04/16/18 1409 04/18/18 1308 04/21/18 1710  ALBUMIN 2.3* 2.2* 2.2*   No results for input(s): LIPASE, AMYLASE in the last 168 hours. CBC: Recent Labs  Lab 04/15/18 1708 04/16/18 1409 04/18/18 1307 04/21/18 1709  WBC 9.8 10.5 6.7 7.1  HGB 9.3* 8.9* 8.0* 8.4*  HCT 29.7* 28.4* 26.2* 27.7*  MCV 108.0* 107.2* 109.6* 107.4*  PLT 208 212 168 210   Blood Culture    Component Value Date/Time   SDES TRACHEAL ASPIRATE 04/03/2018 0325   SPECREQUEST NONE 04/03/2018 0325   CULT  04/03/2018 0325    Consistent with normal respiratory flora. Performed at [facility], [place] [address] [place], [place] [address]    REPTSTATUS 04/05/2018 FINAL 04/03/2018 0325    CBG: Recent Labs  Lab  04/21/18 1157 04/21/18 1820 04/21/18 2131 04/22/18 0604 04/22/18 1142  GLUCAP 144* 96 137* 86 93  Studies/Results: No results found.  Medications: . ferric gluconate (FERRLECIT/NULECIT) IV     . aspirin  81 mg Oral Daily  . atorvastatin  40 mg Oral q1800  . B-complex with vitamin C  1 tablet Per Tube Daily  . brimonidine  1 drop Left Eye TID  . Chlorhexidine Gluconate Cloth  6 each Topical Q0600  . Chlorhexidine Gluconate Cloth  6 each Topical Q0600  . [START ON 04/23/2018] darbepoetin (ARANESP) injection - DIALYSIS  150 mcg Intravenous Q Thu-HD  . feeding supplement (NEPRO CARB STEADY)  237 mL Oral BID BM  . heparin  5,000 Units Subcutaneous Q8H  . hydrALAZINE  25 mg Oral Q8H  . hydrocortisone  25 mg Rectal BID  . insulin aspart  2-6 Units Subcutaneous TID WC  . insulin glargine  15 Units Subcutaneous Daily  . latanoprost  1 drop Both Eyes QHS  . polycarbophil  625 mg Oral Daily  . saccharomyces boulardii  250 mg Oral BID  . sevelamer carbonate  800 mg Oral TID WC  . ticagrelor  90 mg Oral BID  . timolol  1 drop Left Eye Daily  Dialysis Orders: [facility] TTS  4h 76kg 3K/ 2Ca bath LUA AVG Hep none - mircera 100 q 2wks  Assessment/Plan: 1. VF arrest: positive troponin.  ECHO showed EF 40-45% & new wall motion abnormalities.  S/p cath on 9/25 w/PCI of SVG to RCA.  Now on ASA & brilinta for minimum 12 months.  Seen by EP, stable.  2. V Tach nonsustained, torsades (9/26-9/27): likely reperfusion event following PCI per EP.  No indication for ICD/PPM. Resolved.   3. Stool incontinence - ongoing. Painful. C diff negative.  Per primary. 3. ESRD - on HD TTS.  K 4.4. Continue per regular schedule, orders written for tomorrow, minimal UF. 4. Anemia of CKD- Hgb 8.4, Tsat15%, ferritin 1432 - weekly Fe, Aranesp 161mcg qThurs 5. Secondary hyperparathyroidism - Ca 9.6, Phos 4.0. Continue binders 6. HTN/volume - BP variable, currently low.  On hydralazine.  7. Nutrition - Alb 2.2.  Renal diet with fluid restrictions.  8. Debility - on CIR now 9. Limited code: Yes for CPR & meds;, No intubation, Defib or NIPPV  Jen Mow, PA-C [facility] Pager: [phone] 04/22/2018,1:20 PM  LOS: 7 days   Pt seen, examined and agree w A/P as above.  [name] MD [employer] pager [phone]   04/22/2018, 3:19 PM

## 2018-04-23 ENCOUNTER — Inpatient Hospital Stay (HOSPITAL_COMMUNITY): Payer: Medicare Other | Admitting: Occupational Therapy

## 2018-04-23 ENCOUNTER — Inpatient Hospital Stay (HOSPITAL_COMMUNITY): Payer: Medicare Other | Admitting: Physical Therapy

## 2018-04-23 LAB — RENAL FUNCTION PANEL
Albumin: 2.3 g/dL — ABNORMAL LOW (ref 3.5–5.0)
Anion gap: 15 (ref 5–15)
BUN: 35 mg/dL — ABNORMAL HIGH (ref 8–23)
CALCIUM: 9.5 mg/dL (ref 8.9–10.3)
CHLORIDE: 94 mmol/L — AB (ref 98–111)
CO2: 26 mmol/L (ref 22–32)
CREATININE: 4.7 mg/dL — AB (ref 0.44–1.00)
GFR, EST AFRICAN AMERICAN: 9 mL/min — AB (ref >60)
GFR, EST NON AFRICAN AMERICAN: 8 mL/min — AB (ref >60)
Glucose, Bld: 189 mg/dL — ABNORMAL HIGH (ref 70–99)
Phosphorus: 3.3 mg/dL (ref 2.5–4.6)
Potassium: 3.6 mmol/L (ref 3.5–5.1)
SODIUM: 135 mmol/L (ref 135–145)

## 2018-04-23 LAB — CBC
HCT: 26.6 % — ABNORMAL LOW (ref 36.0–46.0)
Hemoglobin: 8.1 g/dL — ABNORMAL LOW (ref 12.0–15.0)
MCH: 32.4 pg (ref 26.0–34.0)
MCHC: 30.5 g/dL (ref 30.0–36.0)
MCV: 106.4 fL — AB (ref 80.0–100.0)
NRBC: 0 % (ref 0.0–0.2)
PLATELETS: 210 10*3/uL (ref 150–400)
RBC: 2.5 MIL/uL — AB (ref 3.87–5.11)
RDW: 13.4 % (ref 11.5–15.5)
WBC: 7.1 10*3/uL (ref 4.0–10.5)

## 2018-04-23 LAB — GLUCOSE, CAPILLARY
GLUCOSE-CAPILLARY: 85 mg/dL (ref 70–99)
GLUCOSE-CAPILLARY: 134 mg/dL — AB (ref 70–99)
GLUCOSE-CAPILLARY: 87 mg/dL (ref 70–99)
GLUCOSE-CAPILLARY: 131 mg/dL — AB (ref 70–99)

## 2018-04-23 MED ORDER — ALTEPLASE 2 MG IJ SOLR: 2.0000 mg | Freq: Once | INTRAMUSCULAR | Status: DC | PRN

## 2018-04-23 MED ORDER — DARBEPOETIN ALFA 150 MCG/0.3ML IJ SOSY
PREFILLED_SYRINGE | INTRAMUSCULAR | Status: AC
  Administered 2018-04-23: 150 ug
  Filled 2018-04-23: qty 0.3

## 2018-04-23 MED ORDER — HEPARIN SODIUM (PORCINE) 1000 UNIT/ML DIALYSIS
1000.0000 [IU] | INTRAMUSCULAR | Status: DC | PRN
  Filled 2018-04-23: qty 1

## 2018-04-23 MED ORDER — LIDOCAINE-PRILOCAINE 2.5-2.5 % EX CREA
1.0000 "application " | TOPICAL_CREAM | CUTANEOUS | Status: DC | PRN
  Filled 2018-04-23: qty 5

## 2018-04-23 MED ORDER — LIDOCAINE HCL (PF) 1 % IJ SOLN
5.0000 mL | INTRAMUSCULAR | Status: DC | PRN
  Filled 2018-04-23: qty 5

## 2018-04-23 MED ORDER — SODIUM CHLORIDE 0.9 % IV SOLN: 100.0000 mL | INTRAVENOUS | Status: DC | PRN

## 2018-04-23 MED ORDER — PENTAFLUOROPROP-TETRAFLUOROETH EX AERO: 1.0000 "application " | INHALATION_SPRAY | CUTANEOUS | Status: DC | PRN

## 2018-04-23 NOTE — Plan of Care (Signed)
  Problem: Spiritual Needs Goal: Ability to function at adequate level Outcome: Progressing   Problem: Consults Goal: RH GENERAL PATIENT EDUCATION Description See Patient Education module for education specifics. Outcome: Progressing Goal: Skin Care Protocol Initiated - if Braden Score 18 or less Description If consults are not indicated, leave blank or document N/A Outcome: Progressing Goal: Nutrition Consult-if indicated Outcome: Progressing Goal: Diabetes Guidelines if Diabetic/Glucose > 140 Description If diabetic or lab glucose is > 140 mg/dl - Initiate Diabetes/Hyperglycemia Guidelines & Document Interventions  Outcome: Progressing   Problem: RH BOWEL ELIMINATION Goal: RH STG MANAGE BOWEL WITH ASSISTANCE Description STG Manage Bowel with min Assistance.  Outcome: Not Progressing Goal: RH STG MANAGE BOWEL W/MEDICATION W/ASSISTANCE Description STG Manage Bowel with Medication with min  Assistance.  Outcome: Not Progressing   Problem: RH SKIN INTEGRITY Goal: RH STG SKIN FREE OF INFECTION/BREAKDOWN Description Min A  Outcome: Not Progressing Goal: RH STG MAINTAIN SKIN INTEGRITY WITH ASSISTANCE Description STG Maintain Skin Integrity With min  Assistance.  Outcome: Not Progressing Goal: RH STG ABLE TO PERFORM INCISION/WOUND CARE W/ASSISTANCE Description STG Able To Perform Incision/Wound Care With min  Assistance.  Outcome: Not Progressing   Problem: RH SAFETY Goal: RH STG ADHERE TO SAFETY PRECAUTIONS W/ASSISTANCE/DEVICE Description STG Adhere to Safety Precautions With min  Assistance/Device.  Outcome: Progressing   Problem: RH PAIN MANAGEMENT Goal: RH STG PAIN MANAGED AT OR BELOW PT'S PAIN GOAL Description Less than 4  Outcome: Progressing   Problem: RH KNOWLEDGE DEFICIT GENERAL Goal: RH STG INCREASE KNOWLEDGE OF SELF CARE AFTER HOSPITALIZATION Description Patient and family will be able to verbalize self care in home setting with cues/handouts    Outcome: Progressing    Pt continues with bowel incontinence, stool leaking, breakdown to buttocks r/t stool, gerrhardts and barrier cream used, hemmorrhoids continue to be a problem for pt

## 2018-04-23 NOTE — Progress Notes (Addendum)
[facility] Progress Note   Subjective:  Seen on HD. Feeling a little better day by day.  Reports formed stool on commode today.  No new complaints.  Objective Vitals:   04/22/18 1949 04/22/18 1950 04/23/18 0633 04/23/18 0641  BP: (!) 141/45 (!) 141/45 (!) 155/57   Pulse:  93 97   Resp:  18 18   Temp:  98.2 F (36.8 C) 98.3 F (36.8 C)   TempSrc:      SpO2:  97% 97%   Weight:    74.3 kg  Height:       Physical Exam General:NAD, elderly, obese female laying in bed Heart:RRR Lungs:nml WOB Abdomen:soft, NTND Extremities:no edema Dialysis Access: LU AVG cannulated   Filed Weights   04/21/18 1714 04/22/18 0617 04/23/18 0641  Weight: 73.3 kg 73 kg 74.3 kg    Intake/Output Summary (Last 24 hours) at 04/23/2018 1320 Last data filed at 04/23/2018 1200 Gross per 24 hour  Intake 892 ml  Output -  Net 892 ml    Additional Objective Labs: Basic Metabolic Panel: Recent Labs  Lab 04/16/18 1409 04/18/18 1308 04/21/18 1710  NA 135 134* 139  K 3.5 4.3 4.4  CL 95* 95* 97*  CO2 24 27 27   GLUCOSE 226* 231* 198*  BUN 41* 30* 39*  CREATININE 5.46* 4.55* 5.56*  CALCIUM 8.7* 9.0 9.6  PHOS 5.3* 4.9* 4.0   Liver Function Tests: Recent Labs  Lab 04/16/18 1409 04/18/18 1308 04/21/18 1710  ALBUMIN 2.3* 2.2* 2.2*   CBC: Recent Labs  Lab 04/16/18 1409 04/18/18 1307 04/21/18 1709  WBC 10.5 6.7 7.1  HGB 8.9* 8.0* 8.4*  HCT 28.4* 26.2* 27.7*  MCV 107.2* 109.6* 107.4*  PLT 212 168 210   CBG: Recent Labs  Lab 04/22/18 1142 04/22/18 1636 04/22/18 2153 04/23/18 0631 04/23/18 1152  GLUCAP 93 161* 128* 85 134*   Studies/Results: No results found.  Medications: . sodium chloride    . sodium chloride    . ferric gluconate (FERRLECIT/NULECIT) IV     . aspirin  81 mg Oral Daily  . atorvastatin  40 mg Oral q1800  . B-complex with vitamin C  1 tablet Per Tube Daily  . brimonidine  1 drop Left Eye TID  . Chlorhexidine Gluconate Cloth  6 each  Topical Q0600  . darbepoetin (ARANESP) injection - DIALYSIS  150 mcg Intravenous Q Thu-HD  . feeding supplement (NEPRO CARB STEADY)  237 mL Oral BID BM  . heparin  5,000 Units Subcutaneous Q8H  . hydrALAZINE  25 mg Oral Q8H  . hydrocortisone  25 mg Rectal BID  . insulin aspart  2-6 Units Subcutaneous TID WC  . insulin glargine  15 Units Subcutaneous Daily  . latanoprost  1 drop Both Eyes QHS  . polycarbophil  625 mg Oral Daily  . saccharomyces boulardii  250 mg Oral BID  . sevelamer carbonate  800 mg Oral TID WC  . ticagrelor  90 mg Oral BID  . timolol  1 drop Left Eye Daily    Dialysis Orders: [facility]  4h 76kg 3K/ 2Ca bath LUA AVG Hep none - mircera 100 q 2wks  Assessment/Plan: 1. VF arrest: positive troponin.  ECHO showed EF 40-45% & new wall motion abnormalities.  S/p cath on 9/25 w/PCI of SVG to RCA.  Now on ASA & brilinta for minimum 12 months.  Seen by EP, stable.  2. V Tach nonsustained, torsades (9/26-9/27): likely reperfusion event following PCI per EP.  No  indication for ICD/PPM. Resolved.   3. Stool incontinence - had BM on commode today.  Per primary. 3. ESRD - on HD TTS.  K 4.4. HD today per regular schedule.  Minimal volume removal.   4. Anemia of CKD- Hgb 8.4, Tsat15%, ferritin 1432 - weekly Fe, Aranesp 120mcg qThurs. No new labs. 5. Secondary hyperparathyroidism - Ca 9.6, Phos 4.0. Continue binders. No new labs. 6. HTN/volume - BP variable. On hydralazine. Does not appear volume overloaded on exam. Minimal gains. 7. Nutrition - Alb 2.2. Renal diet with fluid restrictions.  Nepro.  8. Debility - on CIR now 9. Limited code: Yes for CPR & meds; No intubation, Defib or NIPPV  Jen Mow, PA-C [facility] Pager: [phone] 04/23/2018,1:20 PM  LOS: 8 days   Pt seen, examined and agree w A/P as above.  [name] MD [employer] pager [phone]   04/23/2018, 2:17 PM

## 2018-04-23 NOTE — Progress Notes (Signed)
[place] PHYSICAL MEDICINE & REHABILITATION     PROGRESS NOTE  Subjective/Complaints:  Slept pretty well last night. per-rectal area still raw  ROS: Patient denies fever, rash, sore throat, blurred vision, nausea, vomiting,  cough, shortness of breath or chest pain, joint or back pain, headache, or mood change.    Objective: Vital Signs: Blood pressure (!) 155/57, pulse 97, temperature 98.3 F (36.8 C), resp. rate 18, height 5\' 5"  (1.651 m), weight 74.3 kg, SpO2 97 %. No results found. Recent Labs    04/21/18 1709  WBC 7.1  HGB 8.4*  HCT 27.7*  PLT 210   Recent Labs    04/21/18 1710  NA 139  K 4.4  CL 97*  GLUCOSE 198*  BUN 39*  CREATININE 5.56*  CALCIUM 9.6   CBG (last 3)  Recent Labs    04/22/18 1636 04/22/18 2153 04/23/18 0631  GLUCAP 161* 128* 85    Wt Readings from Last 3 Encounters:  04/23/18 74.3 kg  04/15/18 72.8 kg  01/28/18 77.1 kg    Physical Exam:  BP (!) 155/57 (BP Location: Right Arm)   Pulse 97   Temp 98.3 F (36.8 C)   Resp 18   Ht 5\' 5"  (1.651 m)   Wt 74.3 kg   SpO2 97%   BMI 27.26 kg/m  Constitutional: No distress . Vital signs reviewed. HEENT: EOMI, oral membranes moist Neck: supple Cardiovascular: RRR without murmur. No JVD    Respiratory: CTA Bilaterally without wheezes or rales. Normal effort    GI: BS +, non-tender, non-distended  Musculoskeletal: Generalized edema improved Neurological: She is alert.  A&O x 3.  Motor: 4/5 grossly throughout Skin: Skin is warm and dry.  Peri-anal area slightly red, minimal maceration.  Psychiatric: in good spirits  Assessment/Plan: 1. Functional deficits secondary to debility which require 3+ hours per day of interdisciplinary therapy in a comprehensive inpatient rehab setting. Physiatrist is providing close team supervision and 24 hour management of active medical problems listed below. Physiatrist and rehab team continue to assess barriers to discharge/monitor patient progress  toward functional and medical goals.  Function:  Bathing Bathing position      Bathing parts      Bathing assist        Upper Body Dressing/Undressing Upper body dressing                    Upper body assist        Lower Body Dressing/Undressing Lower body dressing                                  Lower body assist        Toileting Toileting          Toileting assist     Transfers Chair/bed transfer             Locomotion Ambulation           Wheelchair          Cognition Comprehension    Expression    Social Interaction    Problem Solving    Memory      Medical Problem List and Plan: 1.  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  DVT Prophylaxis/Anticoagulation: Subcutaneous heparin.  Monitor for any bleeding episodes 3. Pain Management: Tylenol as needed 4. Mood:  Provide emotional support.  Off of Celexa since hospitalization.  Avoiding QT prolonging agents 5. Neuropsych: This patient is ?fully capable of making decisions on her own behalf. 6. Skin/Wound Care: Routine skin checks  -continue local care to buttocks to keep clean/dry given diarrhea 7. Fluids/Electrolytes/Nutrition: improved PO intake (50-75%) 8.  Hypertension.  Hydralazine 25 mg every 8 hours  -labile based on volume status 9.  Nonsustained V. Tach/bradycardia.  Patient is asymptomatic.  Avoid QT prolonging agents.  Follow-up per cardiology services.  Patient is DNR 10.  C. difficile.  Flexi-Seal discontinued.  Contact precautions.    Vancomycin completed  Stool consistency better but stools remain mushy, frequent  Continue florastor, added fiber  Encourage PO   -spoke with GI   who agrees with current plan   -followup C diff sample negative  -expressed to pt and staff that I want her getting up to bathroom to empty 11.  End-stage renal disease with  hemodialysis.  Hemodialysis recently initiated 3 to 4 weeks ago.    -HD in afternoons after therapy. T TH S  -electrolyte/volume mgt per nephrology 12.  Diabetes mellitus with peripheral neuropathy.  Lantus insulin 15 units daily.  Check blood sugars before meals and at bedtime    -continue SSI   -improved 10/10  13.  History of CAD with CABG 2013.  Continue aspirin 14.  Hyperlipidemia.  Lipitor 15.  Acute on chronic anemia.  Continue Aranesp  Hemoglobin 8.0 on 10/5  Folow up with HD 16.  Hemorrhoids   -sl better results with anusol suppository  -no Sitz bath due to moisture irritated skin  LOS (Days) 8 A FACE TO FACE EVALUATION WAS PERFORMED  Meredith Staggers 04/23/2018 9:12 AM

## 2018-04-23 NOTE — Progress Notes (Signed)
Occupational Therapy Session Note  Patient Details  Name: [name] MRN: [id number] Date of Birth: 11-May-1941  Today's Date: 04/23/2018 OT Individual Time:0902-1000 and 6384-6659 OT Individual Time Calculation (min): 58 min and 47 min    Short Term Goals: Week 1:  OT Short Term Goal 1 (Week 1): Pt will don shirt EOB with CGA OT Short Term Goal 2 (Week 1): Pt will transfer to [facility] with min A OT Short Term Goal 3 (Week 1): Pt will don pants with mod A  OT Short Term Goal 4 (Week 1): Pt will complete peri hygiene following toileting with mod A  Skilled Therapeutic Interventions/Progress Updates:    Session 1: Pt received supine in bed pleasant and willing to participate in therapy session. Pt's daughter arriving with pt breakfast, pt transfers supine>sit with supervision. Pt sat EOB with supervision for sitting balance while eating breakfast. Transitioned into completion of shower task, pt ambulating EOB to BSC in shower using RW with minA. Pt performs bathing task with overall minA for sit<>stand and minA for task completion. Transferred to w/c stand pivot with minA. Due to time constraints assisted pt with LB dressing with pt able to assist with advancing pants over hips in standing with CGA. Pt left seated in w/c end of session with handff to PT to complete UB dressing/start treatment session.   Session 2: Pt received sitting up in w/c reporting increased fatigue from previous therapy sessions but agreeable to treatment session. Transported pt totalA via w/c to dayroom for energy conservation. Pt engaging in seated bil UE/LE exercise x10-15 reps each and with seated rest breaks throughout. Incorporated 1lb dowel rod into UB exercise for added strengthening. Noted pt with decreased ROM to R shoulder therefore limited UB exercise to <90*. Pt transported back to room in manner described above, washed hands seated in w/c and setup for lunch where pt was left with call bell and needs within reach,  daughter present.   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

## 2018-04-23 NOTE — Progress Notes (Signed)
Physical Therapy Weekly Progress Note  Patient Details  Name: [name] MRN: [id number] Date of Birth: 10/21/1940  Beginning of progress report period: April 17, 2018 End of progress report period: April 23, 2018  Today's Date: 04/23/2018 PT Individual Time: 1000-1115 PT Individual Time Calculation (min): 75 min   Pt rec'd in w/c after shower with OT.  Pt dons shirt with set up assist, performs grooming at sink with set up assist. Pt requires total A to don socks and shoes.  Pt performs gait 110', 100' x 3 throughout session with RW with supervision.  [place] therex performed for strengthening and improving balance with pt able to perform with supervision, cues for technique, frequent rests due to fatigue.  nustep 2 mins, 3 mins level 3 for UE/LE strength and endurance.  Pt then incontinent of bowel.  Pt stands at sink with supervision with total A for hygiene. Pt left in room with needs at hand, family present.  Patient has met 4 of 4 short term goals.  Pt improving and is supervision with gait, supervision/min A with transfers and sit <> stand, min A with stair negotiation.  Patient continues to demonstrate the following deficits muscle weakness, decreased cardiorespiratoy endurance and decreased standing balance and decreased balance strategies and therefore will continue to benefit from skilled PT intervention to increase functional independence with mobility.  Patient progressing toward long term goals..  Continue plan of care.  PT Short Term Goals Week 1:  PT Short Term Goal 1 (Week 1): Patient to be able to complete functional bed mobility with min guard  PT Short Term Goal 1 - Progress (Week 1): Met PT Short Term Goal 2 (Week 1): Patient to be able to transfer to WC with MinA on consistent basis  PT Short Term Goal 2 - Progress (Week 1): Met PT Short Term Goal 3 (Week 1): Patient to be able to self-propel WC 20ft  PT Short Term Goal 3 - Progress (Week 1): Met PT Short Term  Goal 4 (Week 1): Patient to initiate gait with LRAD  PT Short Term Goal 4 - Progress (Week 1): Met Week 2:  PT Short Term Goal 1 (Week 2): = LTG  Skilled Therapeutic Interventions/Progress Updates:  Ambulation/gait training;Community reintegration;DME/adaptive equipment instruction;Neuromuscular re-education;Psychosocial support;Stair training;UE/LE Strength taining/ROM;Wheelchair propulsion/positioning;Balance/vestibular training;Discharge planning;Therapeutic Activities;UE/LE Coordination activities;Functional mobility training;Patient/family education;Therapeutic Exercise;Pain management;Splinting/orthotics   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) Pain:  pt c/o pain in buttocks after BM, relieved with changing brief and applying cream  Therapy/Group: Individual Therapy  DONAWERTH,KAREN 04/23/2018, 10:57 AM  

## 2018-04-24 ENCOUNTER — Inpatient Hospital Stay (HOSPITAL_COMMUNITY): Payer: Medicare Other

## 2018-04-24 ENCOUNTER — Inpatient Hospital Stay (HOSPITAL_COMMUNITY): Payer: Medicare Other | Admitting: Occupational Therapy

## 2018-04-24 ENCOUNTER — Inpatient Hospital Stay (HOSPITAL_COMMUNITY): Payer: Medicare Other | Admitting: Physical Therapy

## 2018-04-24 LAB — GLUCOSE, CAPILLARY
GLUCOSE-CAPILLARY: 91 mg/dL (ref 70–99)
GLUCOSE-CAPILLARY: 158 mg/dL — AB (ref 70–99)
Glucose-Capillary: 136 mg/dL — ABNORMAL HIGH (ref 70–99)
Glucose-Capillary: 101 mg/dL — ABNORMAL HIGH (ref 70–99)

## 2018-04-24 MED ORDER — CHLORHEXIDINE GLUCONATE CLOTH 2 % EX PADS
6.0000 | MEDICATED_PAD | Freq: Every day | CUTANEOUS | Status: DC
  Administered 2018-04-24 – 2018-04-25 (×2): 6 via TOPICAL

## 2018-04-24 NOTE — Progress Notes (Signed)
[facility] Progress Note   Subjective:  Walked in the halls, no new c/o's.    Objective Vitals:   04/23/18 2111 04/24/18 0441 04/24/18 0500 04/24/18 0624  BP: (!) 140/47 (!) 163/59    Pulse: 88 96    Resp: 15 15    Temp: 98.3 F (36.8 C) 98.6 F (37 C)  (!) 97.5 F (36.4 C)  TempSrc: Oral Oral  Oral  SpO2: 96% 99%    Weight:   75.2 kg   Height:       Physical Exam General:NAD, elderly, obese female laying in bed Heart:RRR Lungs:nml WOB Abdomen:soft, NTND Extremities:trace edema Dialysis Access: LU AVG cannulated   Filed Weights   04/23/18 0641 04/23/18 1700 04/24/18 0500  Weight: 74.3 kg 76 kg 75.2 kg    Intake/Output Summary (Last 24 hours) at 04/24/2018 1134 Last data filed at 04/24/2018 0830 Gross per 24 hour  Intake 270 ml  Output 0 ml  Net 270 ml    Additional Objective Labs: Basic Metabolic Panel: Recent Labs  Lab 04/18/18 1308 04/21/18 1710 04/23/18 1359  NA 134* 139 135  K 4.3 4.4 3.6  CL 95* 97* 94*  CO2 27 27 26   GLUCOSE 231* 198* 189*  BUN 30* 39* 35*  CREATININE 4.55* 5.56* 4.70*  CALCIUM 9.0 9.6 9.5  PHOS 4.9* 4.0 3.3   Liver Function Tests: Recent Labs  Lab 04/18/18 1308 04/21/18 1710 04/23/18 1359  ALBUMIN 2.2* 2.2* 2.3*   CBC: Recent Labs  Lab 04/18/18 1307 04/21/18 1709 04/23/18 1358  WBC 6.7 7.1 7.1  HGB 8.0* 8.4* 8.1*  HCT 26.2* 27.7* 26.6*  MCV 109.6* 107.4* 106.4*  PLT 168 210 210   CBG: Recent Labs  Lab 04/23/18 1152 04/23/18 1830 04/23/18 2114 04/24/18 0646 04/24/18 1123  GLUCAP 134* 87 131* 91 158*   Studies/Results: No results found.  Medications: . ferric gluconate (FERRLECIT/NULECIT) IV     . aspirin  81 mg Oral Daily  . atorvastatin  40 mg Oral q1800  . B-complex with vitamin C  1 tablet Per Tube Daily  . brimonidine  1 drop Left Eye TID  . Chlorhexidine Gluconate Cloth  6 each Topical Q0600  . darbepoetin (ARANESP) injection - DIALYSIS  150 mcg Intravenous Q Thu-HD  .  feeding supplement (NEPRO CARB STEADY)  237 mL Oral BID BM  . heparin  5,000 Units Subcutaneous Q8H  . hydrALAZINE  25 mg Oral Q8H  . hydrocortisone  25 mg Rectal BID  . insulin aspart  2-6 Units Subcutaneous TID WC  . insulin glargine  15 Units Subcutaneous Daily  . latanoprost  1 drop Both Eyes QHS  . polycarbophil  625 mg Oral Daily  . saccharomyces boulardii  250 mg Oral BID  . sevelamer carbonate  800 mg Oral TID WC  . ticagrelor  90 mg Oral BID  . timolol  1 drop Left Eye Daily    Dialysis Orders: [facility]  4h 76kg 3K/ 2Ca bath LUA AVG Hep none - mircera 100 q 2wks  Assessment/Plan: 1. VF arrest: positive troponin.  ECHO showed EF 40-45% & new wall motion abnormalities.  S/p cath on 9/25 w/PCI of SVG to RCA.  Now on ASA & brilinta for minimum 12 months.  Seen by EP, stable.  2. V Tach nonsustained, torsades (9/26-9/27): likely reperfusion event following PCI per EP.  No indication for ICD/PPM. Resolved.   3. ESRD - on HD TTS.  4. Anemia of CKD- Hgb 8's, Tsat15%,  ferritin 1432 - weekly Fe, Aranesp 177mcg qThurs. No new labs. 5. Secondary hyperparathyroidism - Ca 9.6, Phos 4.0. Continue binders. 6. HTN/volume - BP variable. On hydralazine. Letting vol come up due to hypotensive episodes last week on HD. UF 1L on HD tomorrow.  7. Nutrition - Alb 2.2. Renal diet with fluid restrictions.  Nepro.  8. Debility - on CIR now 9. Limited code: Yes for CPR & meds; No intubation, Defib or NIPPV   [name] MD [facility] pager [phone]   04/24/2018, 11:34 AM

## 2018-04-24 NOTE — Progress Notes (Signed)
[place] PHYSICAL MEDICINE & REHABILITATION     PROGRESS NOTE  Subjective/Complaints:  Had a formed BM this morning!!! Frequency decreasing. In good spirits  ROS: Patient denies fever, rash, sore throat, blurred vision, nausea, vomiting, diarrhea, cough, shortness of breath or chest pain, joint or back pain, headache, or mood change.    Objective: Vital Signs: Blood pressure (!) 163/59, pulse 96, temperature (!) 97.5 F (36.4 C), temperature source Oral, resp. rate 15, height 5\' 5"  (1.651 m), weight 75.2 kg, SpO2 99 %. No results found. Recent Labs    04/21/18 1709 04/23/18 1358  WBC 7.1 7.1  HGB 8.4* 8.1*  HCT 27.7* 26.6*  PLT 210 210   Recent Labs    04/21/18 1710 04/23/18 1359  NA 139 135  K 4.4 3.6  CL 97* 94*  GLUCOSE 198* 189*  BUN 39* 35*  CREATININE 5.56* 4.70*  CALCIUM 9.6 9.5   CBG (last 3)  Recent Labs    04/23/18 1830 04/23/18 2114 04/24/18 0646  GLUCAP 87 131* 91    Wt Readings from Last 3 Encounters:  04/24/18 75.2 kg  04/15/18 72.8 kg  01/28/18 77.1 kg    Physical Exam:  BP (!) 163/59 (BP Location: Right Arm)   Pulse 96   Temp (!) 97.5 F (36.4 C) (Oral) Comment: covered with extra blanket  Resp 15   Ht 5\' 5"  (1.651 m)   Wt 75.2 kg   SpO2 99%   BMI 27.59 kg/m  Constitutional: No distress . Vital signs reviewed. HEENT: EOMI, oral membranes moist Neck: supple Cardiovascular: RRR without murmur. No JVD    Respiratory: CTA Bilaterally without wheezes or rales. Normal effort    GI: BS +, non-tender, non-distended  Musculoskeletal: tr LE edema Neurological: She is alert.  A&O x 3.  Motor: 4/5 grossly throughout Skin: Skin is warm and dry.  Peri-anal area slightly red with minimal maceration.  Psychiatric: in good spirits  Assessment/Plan: 1. Functional deficits secondary to debility which require 3+ hours per day of interdisciplinary therapy in a comprehensive inpatient rehab setting. Physiatrist is providing close team  supervision and 24 hour management of active medical problems listed below. Physiatrist and rehab team continue to assess barriers to discharge/monitor patient progress toward functional and medical goals.  Function:  Bathing Bathing position      Bathing parts      Bathing assist        Upper Body Dressing/Undressing Upper body dressing                    Upper body assist        Lower Body Dressing/Undressing Lower body dressing                                  Lower body assist        Toileting Toileting          Toileting assist     Transfers Chair/bed transfer             Locomotion Ambulation           Wheelchair          Cognition Comprehension    Expression    Social Interaction    Problem Solving    Memory      Medical Problem List and Plan: 1.  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  DVT Prophylaxis/Anticoagulation: Subcutaneous heparin.  Monitor for any bleeding episodes 3. Pain Management: Tylenol as needed 4. Mood: Provide emotional support.  Off of Celexa since hospitalization.  Avoiding QT prolonging agents 5. Neuropsych: This patient is ?fully capable of making decisions on her own behalf. 6. Skin/Wound Care: Routine skin checks  -continue local care to buttocks to keep clean/dry given diarrhea 7. Fluids/Electrolytes/Nutrition: improved PO intake (50-75%) 8.  Hypertension.  Hydralazine 25 mg every 8 hours  -some fluctuation based on volume status 9.  Nonsustained V. Tach/bradycardia.  Patient is asymptomatic.  Avoid QT prolonging agents.  Follow-up per cardiology services.  Patient is DNR 10.  C. difficile.  Flexi-Seal discontinued.  Contact precautions.    Vancomycin completed  Stool consistency better but stools remain mushy, frequent  Continue florastor, added fiber  Encourage PO   -spoke with GI    who agrees with current plan   -followup C diff sample negative  - up to bathroom to toilet 11.  End-stage renal disease with hemodialysis.  Hemodialysis recently initiated 3 to 4 weeks ago.    -HD in afternoons after therapy. T TH S  -electrolyte/volume mgt per nephrology 12.  Diabetes mellitus with peripheral neuropathy.  Lantus insulin 15 units daily.     -continue SSI   -improved 10/11  13.  History of CAD with CABG 2013.  Continue aspirin 14.  Hyperlipidemia.  Lipitor 15.  Acute on chronic anemia.  Continue Aranesp  Hemoglobin 8.1  Folow up with HD 16.  Hemorrhoids   -sl better results with anusol suppository  -no Sitz bath due to moisture irritated skin   -should continue to improve with stools decreasing  LOS (Days) 9 A FACE TO FACE EVALUATION WAS PERFORMED  Meredith Staggers 04/24/2018 9:11 AM

## 2018-04-24 NOTE — Progress Notes (Signed)
Occupational Therapy Weekly Progress Note  Patient Details  Name: [name] MRN: [id number] Date of Birth: 12/15/1940  Beginning of progress report period: April 16, 2018 End of progress report period: April 24, 2018  Today's Date: 04/24/2018 OT Individual Time: 0730-0830 OT Individual Time Calculation (min): 60 min    Patient has met 4 of 4 short term goals.  Pt has made excellent progress this reporting period. Pt's functional activity tolerance has improved significantly, as well as her independence with ADLs and functional mobility. Pt is able to don UB/LB clothing sitting EOB with (S)- CGA and complete all bathing with (S)/ set up.   Patient continues to demonstrate the following deficits: muscle weakness, decreased cardiorespiratoy endurance and decreased standing balance, decreased postural control and decreased balance strategies and therefore will continue to benefit from skilled OT intervention to enhance overall performance with BADL.  Patient progressing toward long term goals..  Continue plan of care.  OT Short Term Goals Week 1:  OT Short Term Goal 1 (Week 1): Pt will don shirt EOB with CGA OT Short Term Goal 1 - Progress (Week 1): Met OT Short Term Goal 2 (Week 1): Pt will transfer to BSC with min A OT Short Term Goal 2 - Progress (Week 1): Met OT Short Term Goal 3 (Week 1): Pt will don pants with mod A  OT Short Term Goal 3 - Progress (Week 1): Met OT Short Term Goal 4 (Week 1): Pt will complete peri hygiene following toileting with mod A OT Short Term Goal 4 - Progress (Week 1): Met Week 2:  OT Short Term Goal 1 (Week 2): STG=LTG d/t ELOS  Skilled Therapeutic Interventions/Progress Updates:    Session focused on functional activity tolerance and b/d tasks at shower level. C/o pain in rectum only. Introduced LH sponge for distal LE bathing, with pt able to use and complete all bathing now at set up level with sit <> stand. Increased time in shower d/t warm water  providing rectum pain relief. Pt completed functional mobility in and out of shower with (S) and good safety awareness. Pt provided with min A for dressing for time management. Prolonged seated rest break required following shower. Pt returned to bed and RN entered room.   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)   Pain: Pain Assessment Pain Scale: Faces Faces Pain Scale: Hurts a little bit Pain Type: Acute pain Pain Location: Rectum Pain Orientation: Medial Pain Descriptors / Indicators: Burning Pain Onset: On-going Patients Stated Pain Goal: 0 Pain Intervention(s): Repositioned ADL: ADL Eating: Set up Where Assessed-Eating: Bed level Grooming: Supervision/safety Where Assessed-Grooming: Sitting at sink Upper Body Bathing: Setup Where Assessed-Upper Body Bathing: Shower Lower Body Bathing: Minimal cueing, Minimal assistance Where Assessed-Lower Body Bathing: Shower Upper Body Dressing: Setup Where Assessed-Upper Body Dressing: Edge of bed Lower Body Dressing: Minimal cueing, Contact guard Where Assessed-Lower Body Dressing: Edge of bed Toileting: Contact guard Where Assessed-Toileting: Toilet Toilet Transfer: Contact guard Toilet Transfer Method: Ambulating Toilet Transfer Equipment: Grab bars Tub/Shower Transfer: Unable to assess Walk-In Shower Transfer: Minimal cueing, Contact guard Walk-In Shower Transfer Method: Ambulating Walk-In Shower Equipment: Other (comment)(BSC)   Therapy/Group: Individual Therapy   H  04/24/2018, 12:07 PM  

## 2018-04-24 NOTE — Plan of Care (Signed)
  Problem: Spiritual Needs Goal: Ability to function at adequate level Outcome: Progressing   Problem: Consults Goal: RH GENERAL PATIENT EDUCATION Description See Patient Education module for education specifics. Outcome: Progressing Goal: Skin Care Protocol Initiated - if Braden Score 18 or less Description If consults are not indicated, leave blank or document N/A Outcome: Progressing Goal: Nutrition Consult-if indicated Outcome: Progressing Goal: Diabetes Guidelines if Diabetic/Glucose > 140 Description If diabetic or lab glucose is > 140 mg/dl - Initiate Diabetes/Hyperglycemia Guidelines & Document Interventions  Outcome: Progressing   Problem: RH BOWEL ELIMINATION Goal: RH STG MANAGE BOWEL WITH ASSISTANCE Description STG Manage Bowel with min Assistance.  Outcome: Progressing Goal: RH STG MANAGE BOWEL W/MEDICATION W/ASSISTANCE Description STG Manage Bowel with Medication with min  Assistance.  Outcome: Progressing   Problem: RH SKIN INTEGRITY Goal: RH STG SKIN FREE OF INFECTION/BREAKDOWN Description Min A  Outcome: Progressing Goal: RH STG MAINTAIN SKIN INTEGRITY WITH ASSISTANCE Description STG Maintain Skin Integrity With min  Assistance.  Outcome: Progressing Goal: RH STG ABLE TO PERFORM INCISION/WOUND CARE W/ASSISTANCE Description STG Able To Perform Incision/Wound Care With min  Assistance.  Outcome: Progressing   Problem: RH SAFETY Goal: RH STG ADHERE TO SAFETY PRECAUTIONS W/ASSISTANCE/DEVICE Description STG Adhere to Safety Precautions With min  Assistance/Device.  Outcome: Progressing   Problem: RH PAIN MANAGEMENT Goal: RH STG PAIN MANAGED AT OR BELOW PT'S PAIN GOAL Description Less than 4  Outcome: Progressing   Problem: RH KNOWLEDGE DEFICIT GENERAL Goal: RH STG INCREASE KNOWLEDGE OF SELF CARE AFTER HOSPITALIZATION Description Patient and family will be able to verbalize self care in home setting with cues/handouts   Outcome: Progressing

## 2018-04-24 NOTE — Progress Notes (Signed)
Occupational Therapy Session Note  Patient Details  Name: [name] MRN: [id number] Date of Birth: 12-16-1940  Today's Date: 04/24/2018 OT Individual Time: 1000-1030 OT Individual Time Calculation (min): 30 min    Short Term Goals: Week 1:  OT Short Term Goal 1 (Week 1): Pt will don shirt EOB with CGA OT Short Term Goal 1 - Progress (Week 1): Met OT Short Term Goal 2 (Week 1): Pt will transfer to [facility] with min A OT Short Term Goal 2 - Progress (Week 1): Met OT Short Term Goal 3 (Week 1): Pt will don pants with mod A  OT Short Term Goal 3 - Progress (Week 1): Met OT Short Term Goal 4 (Week 1): Pt will complete peri hygiene following toileting with mod A OT Short Term Goal 4 - Progress (Week 1): Met Week 2:  OT Short Term Goal 1 (Week 2): STG=LTG d/t ELOS  Skilled Therapeutic Interventions/Progress Updates:    1:1 Therapeutic activity: Focus on functional ambulation in the hallway with RW ~75 feet with extra time and close supervision. Pt performed 3 min 2x  on the SCI Fit in standing for cardio pulmonary endurance. Pt propelled back to room with bilateral LEs for strengthening with extra time and min A. 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

## 2018-04-24 NOTE — Progress Notes (Signed)
Physical Therapy Session Note  Patient Details  Name: [name] MRN: [id number] Date of Birth: Apr 20, 1941  Today's Date: 04/24/2018 PT Individual Time: 1330-1420 PT Individual Time Calculation (min): 50 min   Missed Minutes:  10 min Skilled Therapeutic Interventions/Progress Updates:    Upon therapist entering the room, pt reports that she is having burning on her bottom due to incontinence of bowel/bladder.  Session focused on improving mobility and independence with ADL's as well as LE there ex for D/C home.  Pt ambulated to restroom and performed toilet transfer - required total A to remove depend. Due to significant blood clots in stool, nursing called.  Nursing performed peri care with max A while PT stabilized walker for pt.  Pt then ambulated back to bed and vitals taken as below by nursing who also issued meds.  Pt then performed standing otago based exercise standing at EOB with bed elevated.  Performed 2 x10 heels raises and 2 x 10 standing hip abduction.  Due to fatigue, pt then transferred back to bed with mod A  X 2 for LE.  Pt declined walking today due to fatigue.  Following session, pt left in care of tech [place] with husband in room and bed alarm on.  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) General: PT Amount of Missed Time (min): 10 Minutes PT Missed Treatment Reason: Pain;Patient fatigue Vital Signs: Therapy Vitals BP: (!) 155/70 Pain: Pain Assessment Pain Scale: 0-10 Pain Score: 7  Faces Pain Scale: Hurts a little bit Pain Type: Acute pain Pain Location: Rectum Pain Orientation: Medial;Lower Pain Descriptors / Indicators: Burning Pain Frequency: Constant Pain Onset: On-going Patients Stated Pain Goal: 0 Pain Intervention(s): Medication (See eMAR);Repositioned(tylenol) Multiple Pain Sites:  No   Therapy/Group: Individual Therapy  [name] Hilario Quarry 04/24/2018, 2:41 PM

## 2018-04-24 NOTE — Progress Notes (Signed)
Physical Therapy Session Note  Patient Details  Name: [name] MRN: [id number] Date of Birth: May 16, 1941  Today's Date: 04/24/2018 PT Individual Time: 0830-0924 PT Individual Time Calculation (min): 54 min   Short Term Goals: Week 2:  PT Short Term Goal 1 (Week 2): = LTG  Skilled Therapeutic Interventions/Progress Updates:    pt performs supine to sit with supervision.  Stand pivot transfers throughout session with RW and supervision.  Gait with RW 150', 100' with supervision.  Gait with obstacle negotiation with RW and supervision, good awareness of obstacles and safety with RW.  Step up training to simulate 1 step to enter bathroom at home with pt able to perform multiple reps with close supervision.  Standing balance on foam with clothespin reaching task with pt requiring min A for balance without UE support, CGA for balance with 1 UE support.  Pt then incontinent of bowel, able to stand at sink with supervision, total A for hygiene. Pt left in room with needs at hand.  Therapy Documentation Precautions:  Precautions Precautions: Fall Precaution Comments: chest soreness from CPR, watch vitals  Restrictions Weight Bearing Restrictions: (Left arm fistula) Pain: Pt c/o buttock/rectal pain, eased with rest and repositioning, meds given prior to session   Therapy/Group: Individual Therapy  Azaleah Usman 04/24/2018, 9:25 AM

## 2018-04-25 ENCOUNTER — Inpatient Hospital Stay (HOSPITAL_COMMUNITY): Payer: Medicare Other | Admitting: Physical Therapy

## 2018-04-25 LAB — RENAL FUNCTION PANEL
ALBUMIN: 2.4 g/dL — AB (ref 3.5–5.0)
Anion gap: 11 (ref 5–15)
BUN: 35 mg/dL — AB (ref 8–23)
CHLORIDE: 98 mmol/L (ref 98–111)
CO2: 25 mmol/L (ref 22–32)
CREATININE: 4.91 mg/dL — AB (ref 0.44–1.00)
Calcium: 9.6 mg/dL (ref 8.9–10.3)
GFR, EST AFRICAN AMERICAN: 9 mL/min — AB (ref >60)
GFR, EST NON AFRICAN AMERICAN: 8 mL/min — AB (ref >60)
Glucose, Bld: 214 mg/dL — ABNORMAL HIGH (ref 70–99)
Phosphorus: 2.9 mg/dL (ref 2.5–4.6)
Potassium: 3.6 mmol/L (ref 3.5–5.1)
Sodium: 134 mmol/L — ABNORMAL LOW (ref 135–145)

## 2018-04-25 LAB — GLUCOSE, CAPILLARY
GLUCOSE-CAPILLARY: 88 mg/dL (ref 70–99)
Glucose-Capillary: 84 mg/dL (ref 70–99)
Glucose-Capillary: 168 mg/dL — ABNORMAL HIGH (ref 70–99)
Glucose-Capillary: 148 mg/dL — ABNORMAL HIGH (ref 70–99)

## 2018-04-25 LAB — CBC
HCT: 27.2 % — ABNORMAL LOW (ref 36.0–46.0)
Hemoglobin: 8.4 g/dL — ABNORMAL LOW (ref 12.0–15.0)
MCH: 33.2 pg (ref 26.0–34.0)
MCHC: 30.9 g/dL (ref 30.0–36.0)
MCV: 107.5 fL — AB (ref 80.0–100.0)
NRBC: 0 % (ref 0.0–0.2)
PLATELETS: 215 10*3/uL (ref 150–400)
RBC: 2.53 MIL/uL — AB (ref 3.87–5.11)
RDW: 13.8 % (ref 11.5–15.5)
WBC: 7 10*3/uL (ref 4.0–10.5)

## 2018-04-25 MED ORDER — CALCIUM CARBONATE ANTACID 500 MG PO CHEW
CHEWABLE_TABLET | ORAL | Status: AC
  Filled 2018-04-25: qty 1

## 2018-04-25 NOTE — Plan of Care (Signed)
  Problem: Spiritual Needs Goal: Ability to function at adequate level Outcome: Progressing   Problem: Consults Goal: RH GENERAL PATIENT EDUCATION Description See Patient Education module for education specifics. Outcome: Progressing Goal: Skin Care Protocol Initiated - if Braden Score 18 or less Description If consults are not indicated, leave blank or document N/A Outcome: Progressing Goal: Nutrition Consult-if indicated Outcome: Progressing Goal: Diabetes Guidelines if Diabetic/Glucose > 140 Description If diabetic or lab glucose is > 140 mg/dl - Initiate Diabetes/Hyperglycemia Guidelines & Document Interventions  Outcome: Progressing   Problem: RH BOWEL ELIMINATION Goal: RH STG MANAGE BOWEL WITH ASSISTANCE Description STG Manage Bowel with min Assistance.  Outcome: Progressing Goal: RH STG MANAGE BOWEL W/MEDICATION W/ASSISTANCE Description STG Manage Bowel with Medication with min  Assistance.  Outcome: Progressing   Problem: RH SKIN INTEGRITY Goal: RH STG SKIN FREE OF INFECTION/BREAKDOWN Description Min A  Outcome: Progressing Goal: RH STG MAINTAIN SKIN INTEGRITY WITH ASSISTANCE Description STG Maintain Skin Integrity With min  Assistance.  Outcome: Progressing Goal: RH STG ABLE TO PERFORM INCISION/WOUND CARE W/ASSISTANCE Description STG Able To Perform Incision/Wound Care With min  Assistance.  Outcome: Progressing   Problem: RH SAFETY Goal: RH STG ADHERE TO SAFETY PRECAUTIONS W/ASSISTANCE/DEVICE Description STG Adhere to Safety Precautions With min  Assistance/Device.  Outcome: Progressing   Problem: RH PAIN MANAGEMENT Goal: RH STG PAIN MANAGED AT OR BELOW PT'S PAIN GOAL Description Less than 4  Outcome: Progressing   Problem: RH KNOWLEDGE DEFICIT GENERAL Goal: RH STG INCREASE KNOWLEDGE OF SELF CARE AFTER HOSPITALIZATION Description Patient and family will be able to verbalize self care in home setting with cues/handouts   Outcome: Progressing

## 2018-04-25 NOTE — Progress Notes (Signed)
[place] PHYSICAL MEDICINE & REHABILITATION     PROGRESS NOTE  Subjective/Complaints:  Eating lunch, family at bedside.  ROS: Patient denies fever, rash, sore throat, blurred vision, nausea, vomiting, diarrhea, cough, shortness of breath or chest pain, joint or back pain, headache, or mood change.    Objective: Vital Signs: Blood pressure (!) 132/50, pulse 90, temperature 98.6 F (37 C), temperature source Oral, resp. rate 16, height 5\' 5"  (1.651 m), weight 75.1 kg, SpO2 100 %. No results found. Recent Labs    04/23/18 1358  WBC 7.1  HGB 8.1*  HCT 26.6*  PLT 210   Recent Labs    04/23/18 1359  NA 135  K 3.6  CL 94*  GLUCOSE 189*  BUN 35*  CREATININE 4.70*  CALCIUM 9.5   CBG (last 3)  Recent Labs    04/24/18 2105 04/25/18 0602 04/25/18 1155  GLUCAP 101* 84 168*    Wt Readings from Last 3 Encounters:  04/25/18 75.1 kg  04/15/18 72.8 kg  01/28/18 77.1 kg    Physical Exam:  BP (!) 132/50 (BP Location: Right Arm)   Pulse 90   Temp 98.6 F (37 C) (Oral)   Resp 16   Ht 5\' 5"  (1.651 m)   Wt 75.1 kg   SpO2 100%   BMI 27.55 kg/m  Constitutional: No distress . Vital signs reviewed. HEENT: EOMI, oral membranes moist Neck: supple Cardiovascular: RRR without murmur. No JVD    Respiratory: CTA Bilaterally without wheezes or rales. Normal effort    GI: BS +, non-tender, non-distended  Musculoskeletal: tr LE edema Neurological: She is alert.  A&O x 3.  Motor: 4/5 grossly throughout Skin: Skin is warm and dry.  Peri-anal area slightly red with minimal maceration.  Psychiatric: in good spirits  Assessment/Plan: 1. Functional deficits secondary to debility which require 3+ hours per day of interdisciplinary therapy in a comprehensive inpatient rehab setting. Physiatrist is providing close team supervision and 24 hour management of active medical problems listed below. Physiatrist and rehab team continue to assess barriers to discharge/monitor patient progress  toward functional and medical goals.  Function:  Bathing Bathing position      Bathing parts      Bathing assist        Upper Body Dressing/Undressing Upper body dressing                    Upper body assist        Lower Body Dressing/Undressing Lower body dressing                                  Lower body assist        Toileting Toileting          Toileting assist     Transfers Chair/bed transfer             Locomotion Ambulation           Wheelchair          Cognition Comprehension    Expression    Social Interaction    Problem Solving    Memory      Medical Problem List and Plan: 1.  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  DVT Prophylaxis/Anticoagulation: Subcutaneous heparin.  Monitor for any bleeding episodes 3. Pain Management: Tylenol as needed 4.  Mood: Provide emotional support.  Off of Celexa since hospitalization.  Avoiding QT prolonging agents 5. Neuropsych: This patient is ?fully capable of making decisions on her own behalf. 6. Skin/Wound Care: Routine skin checks  -continue local care to buttocks to keep clean/dry given diarrhea 7. Fluids/Electrolytes/Nutrition: improved PO intake (50-75%) 8.  Hypertension.  Hydralazine 25 mg every 8 hours   Vitals:   04/24/18 2150 04/25/18 0427  BP: (!) 152/64 (!) 132/50  Pulse:  90  Resp:  16  Temp:  98.6 F (37 C)  SpO2:  100%   9.  Nonsustained V. Tach/bradycardia.  Patient is asymptomatic.  Avoid QT prolonging agents.  Follow-up per cardiology services.  Patient is DNR 10.  C. difficile.  Flexi-Seal discontinued.  Contact precautions.    Vancomycin completed  Stool consistency better but stools remain mushy, frequent  Continue florastor, added fiber  Encourage PO   -spoke with GI   who agrees with current plan   -followup C diff sample negative  - up to  bathroom to toilet 11.  End-stage renal disease with hemodialysis.  Hemodialysis recently initiated 3 to 4 weeks ago.    -HD in afternoons after therapy. T TH S  -electrolyte/volume mgt per nephrology 12.  Diabetes mellitus with peripheral neuropathy.  Lantus insulin 15 units daily.     -continue SSI   -improved 10/11  13.  History of CAD with CABG 2013.  Continue aspirin 14.  Hyperlipidemia.  Lipitor 15.  Acute on chronic anemia.  Continue Aranesp  Hemoglobin 8.1, stable versus 04/21/2018 when it was 8.4 we will continue to monitor  Folow up with HD 16.  Hemorrhoids no complaints of pain today   -sl better results with anusol suppository  -no Sitz bath due to moisture irritated skin   -should continue to improve with stools decreasing  LOS (Days) 10 A FACE TO FACE EVALUATION WAS PERFORMED  Charlett Blake 04/25/2018 12:51 PM

## 2018-04-25 NOTE — Progress Notes (Addendum)
Physical Therapy Session Note  Patient Details  Name: [name] MRN: [id number] Date of Birth: 09/04/1940  Today's Date: 04/25/2018 PT Individual Time: 1002-1045 PT Individual Time Calculation (min): 43 min   Short Term Goals: Week 2:  PT Short Term Goal 1 (Week 2): = LTG  Skilled Therapeutic Interventions/Progress Updates:   Pt in supine and agreeable to therapy, denies pain but reports soreness at chest from yesterday's therapy sessions. Session focused on endurance w/ functional mobility. Performed bathing and dressing at w/c level at sink w/ primarily supervision and verbal cues for technique, needed min assist to thread pants and wash ends of legs and bottom. CGA toilet transfer w/ total assist for pericare, pt states she cannot reach back 2/2 chest soreness. Ambulated 150' x2 w/ close supervision to work on endurance. Brief seated rest break before returning to room. Moderate increase in work of breathing w/ all functional mobility, resolves w/ brief rest and verbal reminders to rest. Ended session in w/c and in care of family, all needs met.   Therapy Documentation Precautions:  Precautions Precautions: Fall Precaution Comments: chest soreness from CPR, watch vitals  Restrictions Weight Bearing Restrictions: Yes(A/V fistula to LUE)  Therapy/Group: Individual Therapy   K  04/25/2018, 10:49 AM  

## 2018-04-26 ENCOUNTER — Inpatient Hospital Stay (HOSPITAL_COMMUNITY): Payer: Medicare Other | Admitting: Occupational Therapy

## 2018-04-26 LAB — GLUCOSE, CAPILLARY
GLUCOSE-CAPILLARY: 100 mg/dL — AB (ref 70–99)
GLUCOSE-CAPILLARY: 184 mg/dL — AB (ref 70–99)
GLUCOSE-CAPILLARY: 87 mg/dL (ref 70–99)
Glucose-Capillary: 142 mg/dL — ABNORMAL HIGH (ref 70–99)

## 2018-04-26 NOTE — Plan of Care (Signed)
  Problem: Spiritual Needs Goal: Ability to function at adequate level Outcome: Progressing   Problem: Consults Goal: RH GENERAL PATIENT EDUCATION Description See Patient Education module for education specifics. Outcome: Progressing Goal: Skin Care Protocol Initiated - if Braden Score 18 or less Description If consults are not indicated, leave blank or document N/A Outcome: Progressing Goal: Nutrition Consult-if indicated Outcome: Progressing Goal: Diabetes Guidelines if Diabetic/Glucose > 140 Description If diabetic or lab glucose is > 140 mg/dl - Initiate Diabetes/Hyperglycemia Guidelines & Document Interventions  Outcome: Progressing   Problem: RH BOWEL ELIMINATION Goal: RH STG MANAGE BOWEL WITH ASSISTANCE Description STG Manage Bowel with min Assistance.  Outcome: Progressing Goal: RH STG MANAGE BOWEL W/MEDICATION W/ASSISTANCE Description STG Manage Bowel with Medication with min  Assistance.  Outcome: Progressing   Problem: RH SKIN INTEGRITY Goal: RH STG SKIN FREE OF INFECTION/BREAKDOWN Description Min A  Outcome: Progressing Goal: RH STG MAINTAIN SKIN INTEGRITY WITH ASSISTANCE Description STG Maintain Skin Integrity With min  Assistance.  Outcome: Progressing Goal: RH STG ABLE TO PERFORM INCISION/WOUND CARE W/ASSISTANCE Description STG Able To Perform Incision/Wound Care With min  Assistance.  Outcome: Progressing   Problem: RH SAFETY Goal: RH STG ADHERE TO SAFETY PRECAUTIONS W/ASSISTANCE/DEVICE Description STG Adhere to Safety Precautions With min  Assistance/Device.  Outcome: Progressing   Problem: RH PAIN MANAGEMENT Goal: RH STG PAIN MANAGED AT OR BELOW PT'S PAIN GOAL Description Less than 4  Outcome: Progressing   Problem: RH KNOWLEDGE DEFICIT GENERAL Goal: RH STG INCREASE KNOWLEDGE OF SELF CARE AFTER HOSPITALIZATION Description Patient and family will be able to verbalize self care in home setting with cues/handouts   Outcome: Progressing

## 2018-04-26 NOTE — Progress Notes (Signed)
[place] PHYSICAL MEDICINE & REHABILITATION     PROGRESS NOTE  Subjective/Complaints:  Husband at bedside no new complaints.  Had dialysis yesterday afternoon  ROS: Patient denies fever, rash, sore throat, blurred vision, nausea, vomiting, diarrhea, cough, shortness of breath or chest pain, joint or back pain, headache, or mood change.    Objective: Vital Signs: Blood pressure (!) 157/53, pulse 96, temperature 98.2 F (36.8 C), temperature source Oral, resp. rate 18, height 5\' 5"  (1.651 m), weight 75.4 kg, SpO2 96 %. No results found. Recent Labs    04/23/18 1358 04/25/18 1322  WBC 7.1 7.0  HGB 8.1* 8.4*  HCT 26.6* 27.2*  PLT 210 215   Recent Labs    04/23/18 1359 04/25/18 1322  NA 135 134*  K 3.6 3.6  CL 94* 98  GLUCOSE 189* 214*  BUN 35* 35*  CREATININE 4.70* 4.91*  CALCIUM 9.5 9.6   CBG (last 3)  Recent Labs    04/25/18 1813 04/25/18 2117 04/26/18 0552  GLUCAP 88 148* 100*    Wt Readings from Last 3 Encounters:  04/26/18 75.4 kg  04/15/18 72.8 kg  01/28/18 77.1 kg    Physical Exam:  BP (!) 157/53 (BP Location: Right Arm)   Pulse 96   Temp 98.2 F (36.8 C) (Oral)   Resp 18   Ht 5\' 5"  (1.651 m)   Wt 75.4 kg   SpO2 96%   BMI 27.66 kg/m  Constitutional: No distress . Vital signs reviewed. HEENT: EOMI, oral membranes moist Neck: supple Cardiovascular: RRR without murmur. No JVD    Respiratory: CTA Bilaterally without wheezes or rales. Normal effort    GI: BS +, non-tender, non-distended  Musculoskeletal: tr LE edema Neurological: She is alert.  A&O x 3.  Motor: 4/5 grossly throughout Skin: Skin is warm and dry.  Peri-anal area slightly red with minimal maceration.  Psychiatric: in good spirits  Assessment/Plan: 1. Functional deficits secondary to debility which require 3+ hours per day of interdisciplinary therapy in a comprehensive inpatient rehab setting. Physiatrist is providing close team supervision and 24 hour management of active  medical problems listed below. Physiatrist and rehab team continue to assess barriers to discharge/monitor patient progress toward functional and medical goals.  Function:  Bathing Bathing position      Bathing parts      Bathing assist        Upper Body Dressing/Undressing Upper body dressing                    Upper body assist        Lower Body Dressing/Undressing Lower body dressing                                  Lower body assist        Toileting Toileting          Toileting assist     Transfers Chair/bed transfer             Locomotion Ambulation           Wheelchair          Cognition Comprehension    Expression    Social Interaction    Problem Solving    Memory      Medical Problem List and Plan: 1.  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  DVT Prophylaxis/Anticoagulation: Subcutaneous heparin.  Monitor for any bleeding episodes 3. Pain Management: Tylenol as needed 4. Mood: Provide emotional support.  Off of Celexa since hospitalization.  Avoiding QT prolonging agents 5. Neuropsych: This patient is ?fully capable of making decisions on her own behalf. 6. Skin/Wound Care: Routine skin checks  -continue local care to buttocks to keep clean/dry given diarrhea 7. Fluids/Electrolytes/Nutrition: improved PO intake (50-75%) 8.  Hypertension.  Hydralazine 25 mg every 8 hours   Vitals:   04/25/18 1948 04/26/18 0555  BP: (!) 117/31 (!) 157/53  Pulse: 92 96  Resp: 18 18  Temp: 98.6 F (37 C) 98.2 F (36.8 C)  SpO2: 97% 96%   9.  Nonsustained V. Tach/bradycardia.  Patient is asymptomatic.  Avoid QT prolonging agents.  Follow-up per cardiology services.  Patient is DNR 10.  C. difficile.    Dramatically improved 1 mushy BM yesterday, none thus far today     11.  End-stage renal disease with hemodialysis.   Hemodialysis recently initiated 3 to 4 weeks ago.    -HD in afternoons after therapy. T TH S, no hypotension during dialysis  -electrolyte/volume mgt per nephrology 12.  Diabetes mellitus with peripheral neuropathy.  Lantus insulin 15 units daily.     -continue SSI   -improved 10/11  13.  History of CAD with CABG 2013.  Continue aspirin 14.  Hyperlipidemia.  Lipitor 15.  Acute on chronic anemia.  Continue Aranesp  Hemoglobin  stable at 8.4 on 04/25/2018 folow up with HD 16.  Hemorrhoids no complaints of pain today   -sl better results with anusol suppository  -no Sitz bath due to moisture irritated skin   -should continue to improve with stools decreasing  LOS (Days) 11 A FACE TO FACE EVALUATION WAS PERFORMED  Charlett Blake 04/26/2018 11:09 AM

## 2018-04-26 NOTE — Progress Notes (Signed)
Occupational Therapy Session Note  Patient Details  Name: [name] MRN: [id number] Date of Birth: [birth year]  Today's Date: 04/26/2018 OT Group Time: 1100-1200 OT Group Time Calculation (min): 60 min  Skilled Therapeutic Interventions/Progress Updates:  Pt engaged in therapeutic w/c level dance group focusing on patient choice, UE/LE strengthening, salience, activity tolerance, and social participation. Pt was guided through various dance-based exercises involving UEs/LEs and trunk. All music was selected by group members. Emphasis placed on endurance, as pt declined standing. Pt was engaged throughout session, smiling, moving UEs/LEs simultaneously, and required minimal rest breaks. At end of session spouse escorted her back to room.    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) VPain: No s/s pain during session  Pain Assessment Pain Scale: 0-10 Pain Score: 4  Faces Pain Scale: No hurt Pain Type: Acute pain Pain Location: Rectum Pain Orientation: Medial Pain Descriptors / Indicators: Burning Pain Onset: On-going Patients Stated Pain Goal: 0 Pain Intervention(s): Medication (See eMAR)(tylenol given) Multiple Pain Sites: No ADL: ADL Eating: Set up Where Assessed-Eating: Bed level Grooming: Supervision/safety Where Assessed-Grooming: Sitting at sink Upper Body Bathing: Setup Where Assessed-Upper Body Bathing: Shower Lower Body Bathing: Minimal cueing, Minimal assistance Where Assessed-Lower Body Bathing: Shower Upper Body Dressing: Setup Where Assessed-Upper Body Dressing: Edge of bed Lower Body Dressing: Minimal cueing, Contact guard Where Assessed-Lower Body Dressing: Edge of bed Toileting: Contact guard Where Assessed-Toileting: Glass blower/designer: Therapist, music Method: Information systems manager: Energy manager: Unable to English as a second language teacher: Minimal cueing, Curator Method: Heritage manager: Other (comment)(BSC)     Therapy/Group: Group Therapy  Wavie Hashimi A Lazara Grieser 04/26/2018, 12:32 PM

## 2018-04-27 ENCOUNTER — Inpatient Hospital Stay (HOSPITAL_COMMUNITY): Payer: Medicare Other

## 2018-04-27 ENCOUNTER — Inpatient Hospital Stay (HOSPITAL_COMMUNITY): Payer: Medicare Other | Admitting: Physical Therapy

## 2018-04-27 ENCOUNTER — Inpatient Hospital Stay (HOSPITAL_COMMUNITY): Payer: Medicare Other | Admitting: Occupational Therapy

## 2018-04-27 LAB — GLUCOSE, CAPILLARY
GLUCOSE-CAPILLARY: 99 mg/dL (ref 70–99)
GLUCOSE-CAPILLARY: 147 mg/dL — AB (ref 70–99)
Glucose-Capillary: 165 mg/dL — ABNORMAL HIGH (ref 70–99)

## 2018-04-27 MED ORDER — CHLORHEXIDINE GLUCONATE CLOTH 2 % EX PADS: 6.0000 | MEDICATED_PAD | Freq: Every day | CUTANEOUS | Status: DC

## 2018-04-27 NOTE — Progress Notes (Signed)
[name] PHYSICAL MEDICINE & REHABILITATION     PROGRESS NOTE  Subjective/Complaints:  Had a good weekend. Stools continue to be solid. Butt better but still raw.   ROS: Patient denies fever, rash, sore throat, blurred vision, nausea, vomiting, diarrhea, cough, shortness of breath or chest pain, joint or back pain, headache, or mood change.   Objective: Vital Signs: Blood pressure 137/62, pulse 100, temperature 98.2 F (36.8 C), temperature source Oral, resp. rate 18, height 5\' 5"  (1.651 m), weight 75 kg, SpO2 98 %. No results found. Recent Labs    04/25/18 1322  WBC 7.0  HGB 8.4*  HCT 27.2*  PLT 215   Recent Labs    04/25/18 1322  NA 134*  K 3.6  CL 98  GLUCOSE 214*  BUN 35*  CREATININE 4.91*  CALCIUM 9.6   CBG (last 3)  Recent Labs    04/26/18 1656 04/26/18 2126 04/27/18 0639  GLUCAP 142* 87 99    Wt Readings from Last 3 Encounters:  04/27/18 75 kg  04/15/18 72.8 kg  01/28/18 77.1 kg    Physical Exam:  BP 137/62 (BP Location: Right Arm)   Pulse 100   Temp 98.2 F (36.8 C) (Oral)   Resp 18   Ht 5\' 5"  (1.651 m)   Wt 75 kg   SpO2 98%   BMI 27.51 kg/m  Constitutional: No distress . Vital signs reviewed. HEENT: EOMI, oral membranes moist Neck: supple Cardiovascular: RRR without murmur. No JVD    Respiratory: CTA Bilaterally without wheezes or rales. Normal effort    GI: BS +, non-tender, non-distended  Musculoskeletal: tr LE edema Neurological: She is alert.  A&O x 3.  Motor: 4/5 grossly throughout Skin: per=rectal area intact Psychiatric: in good spirits  Assessment/Plan: 1. Functional deficits secondary to debility which require 3+ hours per day of interdisciplinary therapy in a comprehensive inpatient rehab setting. Physiatrist is providing close team supervision and 24 hour management of active medical problems listed below. Physiatrist and rehab team continue to assess barriers to discharge/monitor patient progress toward functional and  medical goals.  Function:  Bathing Bathing position      Bathing parts      Bathing assist        Upper Body Dressing/Undressing Upper body dressing                    Upper body assist        Lower Body Dressing/Undressing Lower body dressing                                  Lower body assist        Toileting Toileting          Toileting assist     Transfers Chair/bed transfer             Locomotion Ambulation           Wheelchair          Cognition Comprehension    Expression    Social Interaction    Problem Solving    Memory      Medical Problem List and Plan: 1.  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    2.  DVT Prophylaxis/Anticoagulation: Subcutaneous heparin.  Monitor for any  bleeding episodes 3. Pain Management: Tylenol as needed 4. Mood: Provide emotional support.  Off of Celexa since hospitalization.  Avoiding QT prolonging agents 5. Neuropsych: This patient is ?fully capable of making decisions on her own behalf. 6. Skin/Wound Care: Routine skin checks  -continue local care to buttocks to keep clean/dry given diarrhea 7. Fluids/Electrolytes/Nutrition: improved PO intake (50-75%) 8.  Hypertension.  Hydralazine 25 mg every 8 hours   Vitals:   04/26/18 2055 04/27/18 0428  BP: (!) 167/56 137/62  Pulse: 95 100  Resp: 18 18  Temp: 98 F (36.7 C) 98.2 F (36.8 C)  SpO2: 97% 98%   9.  Nonsustained V. Tach/bradycardia.  Patient is asymptomatic.  Avoid QT prolonging agents.  Follow-up per cardiology services.  Patient is DNR 10.  C. difficile.    Dramatically improved  11.  End-stage renal disease with hemodialysis.  Hemodialysis recently initiated 3 to 4 weeks ago.    -HD in afternoons after therapy. T TH S, no hypotension during  dialysis  -electrolyte/volume mgt per nephrology 12.  Diabetes mellitus with peripheral neuropathy.  Lantus insulin 15 units daily.     -continue SSI   -improved 10/11  13.  History of CAD with CABG 2013.  Continue aspirin 14.  Hyperlipidemia.  Lipitor 15.  Acute on chronic anemia.  Continue Aranesp  Hemoglobin  stable at 8.4 on 04/25/2018 folow up with HD 16.  Hemorrhoids     -PRN anusol suppository   -should continue to improve with stools decreasing  LOS (Days) 12 A FACE TO FACE EVALUATION WAS PERFORMED  Meredith Staggers 04/27/2018 9:06 AM

## 2018-04-27 NOTE — Progress Notes (Signed)
Occupational Therapy Discharge Summary  Patient Details  Name: [name] MRN: [id number] Date of Birth: 1941/01/18  Today's Date: 04/27/2018 OT Individual Time: 0730-0828 Session 2: 7672-0947 OT Individual Time Calculation (min): 58 min Session 2: 57 min   Patient has met 10 of 10 long term goals due to improved activity tolerance, improved balance, ability to compensate for deficits, improved awareness and improved coordination.  Patient to discharge at overall Supervision level.  Patient's care partner is independent to provide the necessary physical assistance at discharge.    Reasons goals not met: All goals met  Recommendation:  Patient will benefit from ongoing skilled OT services in home health setting to continue to advance functional skills in the area of BADL.  Equipment: No equipment provided  Reasons for discharge: treatment goals met and discharge from hospital  Patient/family agrees with progress made and goals achieved: Yes   Skilled OT Intervention: Session 1: Session focused on d/c planning and b/d at shower level. Pt used RW to complete functional mobility into bathroom and transfer onto [facility] in walk in shower with (S). She completed all UB/LB bathing with (S), with use of LH sponge for distal LE bathing. Pt demo-ed good carryover of safety awareness when transitioning in and out of shower. Pt sat EOB and donned UB/LB clothing with (S) only. Pt's husband expressed concern re d/c stating "she hasn't practiced wiping her bottom". This OT assured husband pt has been performing this task every day with therapy at (S) level. Pt was left sitting up in w/c with all needs met and husband present.   Session 2: Session focused on toileting tasks. Pt completed functional mobility into bathroom with RW at (S) level. Pt able to complete peri hygiene in standing with (S). Pt required assistance to apply cream to buttocks. Pt completed clothing management with (S). Pt was  transported to gym where she completed functional reaching in standing, throwing bean bags between her legs to simulate posterior peri hygiene with no more than (S) provided throughout. Pt requested medicine per RN for heartburn. Pt completed 100 ft of w/c propulsion with (S) and cueing for encouragement. Pt was returned to room and left sitting up with all needs met.    OT Discharge Precautions/Restrictions  Precautions Precautions: Fall Restrictions Weight Bearing Restrictions: No   Pain Pain Assessment Pain Scale: 0-10 Pain Score: 0-No pain ADL ADL Eating: Set up Where Assessed-Eating: Bed level Grooming: Supervision/safety Where Assessed-Grooming: Sitting at sink Upper Body Bathing: Setup Where Assessed-Upper Body Bathing: Shower Lower Body Bathing: Minimal cueing, Minimal assistance Where Assessed-Lower Body Bathing: Shower Upper Body Dressing: Setup Where Assessed-Upper Body Dressing: Edge of bed Lower Body Dressing: Minimal cueing, Contact guard Where Assessed-Lower Body Dressing: Edge of bed Toileting: Contact guard Where Assessed-Toileting: Glass blower/designer: Therapist, music Method: Counselling psychologist: Energy manager: Unable to assess [employer] Transfer: Minimal cueing, Curator Method: Heritage manager: Other (comment)(BSC) Vision Baseline Vision/History: Wears glasses Wears Glasses: At all times Patient Visual Report: No change from baseline Vision Assessment?: No apparent visual deficits Perception  Perception: Within Functional Limits Praxis Praxis: Intact Cognition Overall Cognitive Status: Within Functional Limits for tasks assessed Arousal/Alertness: Awake/alert Orientation Level: Oriented X4 Attention: Selective Selective Attention: Appears intact Memory: Appears intact Awareness: Appears intact Problem Solving: Appears intact Initiating:  Appears intact Safety/Judgment: Appears intact Sensation Sensation Light Touch: Appears Intact Hot/Cold: Appears Intact Proprioception: Appears Intact Stereognosis: Appears Intact Coordination Gross Motor Movements  are Fluid and Coordinated: No Fine Motor Movements are Fluid and Coordinated: Yes Coordination and Movement Description: Pt still limited by fatigue, d/c at (S) level Finger Nose Finger Test: [facility] Motor  Motor Motor: Within Functional Limits Motor - Discharge Observations: Limited by fatigue/deconditioning  Mobility  Bed Mobility Bed Mobility: Rolling Right;Rolling Left;Sit to Supine;Supine to Sit Rolling Right: Independent with assistive device Rolling Left: Independent with assistive device Supine to Sit: Independent with assistive device Sit to Supine: Independent with assistive device Scooting to [facility]: Independent with assistive device Transfers Sit to Stand: Supervision/Verbal cueing Stand to Sit: Supervision/Verbal cueing  Trunk/Postural Assessment  Cervical Assessment Cervical Assessment: Exceptions to WFL(forward head) Thoracic Assessment Thoracic Assessment: Within Functional Limits Lumbar Assessment Lumbar Assessment: Exceptions to WFL(sits in posterior pelvic tilt) Postural Control Postural Control: Deficits on evaluation Righting Reactions: Postural control has improved great but remains delayed  Balance Balance Balance Assessed: Yes Static Sitting Balance Static Sitting - Balance Support: Feet supported;No upper extremity supported Static Sitting - Level of Assistance: 7: Independent Dynamic Sitting Balance Dynamic Sitting - Balance Support: Feet supported;No upper extremity supported Dynamic Sitting - Level of Assistance: 5: Stand by assistance Sitting balance - Comments: during dressing Static Standing Balance Static Standing - Balance Support: During functional activity Static Standing - Level of Assistance: 5: Stand by assistance Dynamic  Standing Balance Dynamic Standing - Balance Support: During functional activity;Bilateral upper extremity supported Dynamic Standing - Level of Assistance: 5: Stand by assistance Extremity/Trunk Assessment RUE Assessment RUE Assessment: Exceptions to [facility] General Strength Comments: generalized weakness, 4-/5 LUE Assessment LUE Assessment: Exceptions to [facility] General Strength Comments: 4-/5   Curtis Sites 04/27/2018, 9:44 AM

## 2018-04-27 NOTE — Progress Notes (Signed)
[facility] Progress Note   Subjective:  No c/o's  Objective Vitals:   04/26/18 0555 04/26/18 1420 04/26/18 2055 04/27/18 0428  BP: (!) 157/53 (!) 138/49 (!) 167/56 137/62  Pulse: 96 83 95 100  Resp: 18  18 18   Temp: 98.2 F (36.8 C) 97.9 F (36.6 C) 98 F (36.7 C) 98.2 F (36.8 C)  TempSrc: Oral   Oral  SpO2: 96% 99% 97% 98%  Weight:    75 kg  Height:       Physical Exam General:NAD, elderly, obese female laying in bed, looks more energetic Heart:RRR Lungs:nml WOB Abdomen:soft, NTND Extremities:trace edema Dialysis Access: LU AVG cannulated    Dialysis Orders: [facility] TTS  4h 76kg 3K/ 2Ca bath LUA AVG Hep none - mircera 100 q 2wks  Assessment/Plan: 1. VF arrest: positive troponin.  ECHO showed EF 40-45% & new wall motion abnormalities.  S/p cath on 9/25 w/PCI of SVG to RCA.  ASA & brilinta for minimum 12 mos 2. V Tach nonsustained, torsades (9/26-9/27): likely reperfusion event following PCI per EP.  No indication for ICD/PPM.  3. ESRD - on HD TTS.  HD tomorrow.  4. Anemia of CKD- Hgb 8's, Tsat15%, ferritin 1432 - weekly Fe, Aranesp 170mcg qThurs. No new labs. 5. Secondary hyperparathyroidism - Ca 9.6, Phos 4.0. Continue binders. 6. HTN/volume - BP variable. On hydralazine. UF 1-2 L on HD tomorrow.  7. Nutrition - Alb 2.2. Renal diet with fluid restrictions.  Nepro.  8. Debility - on CIR now 9. Limited code: Yes for CPR & meds; No intubation, Defib or NIPPV   [name] MD [facility] pager [phone]   04/27/2018, 7:23 AM  Filed Weights   04/25/18 1712 04/26/18 0500 04/27/18 0428  Weight: 77.1 kg 75.4 kg 75 kg    Intake/Output Summary (Last 24 hours) at 04/27/2018 0723 Last data filed at 04/26/2018 2312 Gross per 24 hour  Intake 360 ml  Output -  Net 360 ml    Additional Objective Labs: Basic Metabolic Panel: Recent Labs  Lab 04/21/18 1710 04/23/18 1359 04/25/18 1322  NA 139 135 134*  K 4.4 3.6 3.6  CL  97* 94* 98  CO2 27 26 25   GLUCOSE 198* 189* 214*  BUN 39* 35* 35*  CREATININE 5.56* 4.70* 4.91*  CALCIUM 9.6 9.5 9.6  PHOS 4.0 3.3 2.9   Liver Function Tests: Recent Labs  Lab 04/21/18 1710 04/23/18 1359 04/25/18 1322  ALBUMIN 2.2* 2.3* 2.4*   CBC: Recent Labs  Lab 04/21/18 1709 04/23/18 1358 04/25/18 1322  WBC 7.1 7.1 7.0  HGB 8.4* 8.1* 8.4*  HCT 27.7* 26.6* 27.2*  MCV 107.4* 106.4* 107.5*  PLT 210 210 215   CBG: Recent Labs  Lab 04/26/18 0552 04/26/18 1229 04/26/18 1656 04/26/18 2126 04/27/18 0639  GLUCAP 100* 184* 142* 87 99   Studies/Results: No results found.  Medications: . ferric gluconate (FERRLECIT/NULECIT) IV     . aspirin  81 mg Oral Daily  . atorvastatin  40 mg Oral q1800  . B-complex with vitamin C  1 tablet Per Tube Daily  . brimonidine  1 drop Left Eye TID  . darbepoetin (ARANESP) injection - DIALYSIS  150 mcg Intravenous Q Thu-HD  . feeding supplement (NEPRO CARB STEADY)  237 mL Oral BID BM  . heparin  5,000 Units Subcutaneous Q8H  . hydrALAZINE  25 mg Oral Q8H  . hydrocortisone  25 mg Rectal BID  . insulin aspart  2-6 Units Subcutaneous TID WC  .  insulin glargine  15 Units Subcutaneous Daily  . latanoprost  1 drop Both Eyes QHS  . polycarbophil  625 mg Oral Daily  . saccharomyces boulardii  250 mg Oral BID  . sevelamer carbonate  800 mg Oral TID WC  . ticagrelor  90 mg Oral BID  . timolol  1 drop Left Eye Daily

## 2018-04-27 NOTE — Discharge Instructions (Signed)
Inpatient Rehab Discharge Instructions  [name] Discharge date and time: No discharge date for patient encounter.   Activities/Precautions/ Functional Status: Activity: activity as tolerated Diet: diabetic diet Wound Care: none needed Functional status:  ___ No restrictions     ___ Walk up steps independently ___ 24/7 supervision/assistance   ___ Walk up steps with assistance ___ Intermittent supervision/assistance  ___ Bathe/dress independently ___ Walk with walker     _x__ Bathe/dress with assistance ___ Walk Independently    ___ Shower independently ___ Walk with assistance    ___ Shower with assistance ___ No alcohol     ___ Return to work/school ________    .COMMUNITY REFERRALS UPON DISCHARGE:    Home Health:   PT     OT     RN                      Agency: [facility] Phone:  [phone]      Special Instructions: Continue hemodialysis as directed  Please remain off of Celexa until further notice   My questions have been answered and I understand these instructions. I will adhere to these goals and the provided educational materials after my discharge from the hospital.  Patient/Caregiver Signature _______________________________ Date __________  Clinician Signature _______________________________________ Date __________  Please bring this form and your medication list with you to all your follow-up doctor's appointments.

## 2018-04-27 NOTE — Progress Notes (Signed)
Physical Therapy Discharge Summary  Patient Details  Name: [name] MRN: [id number] Date of Birth: 1941/01/10  Today's Date: 04/27/2018 PT Individual Time: 0930-1013 PT Individual Time Calculation (min): 43 min   Pt performs gait, transfers, w/c mobility and bed mobility with supervision with RW.  Simulated car transfer with supervision with RW.  Gait on uneven surface with supervision with RW.  1 step up to simulate entrance to bathroom with pt performing with min A with RW.  Pt continues to require frequent rest breaks with all mobility, but is motivated to improve.  Pt's husband is aware of recommendation for supervision with all mobility. Pt/husband state they feel prepared to d/c home at this level of care.  Patient has met 6 of 6 long term goals due to improved activity tolerance, improved balance, increased strength and ability to compensate for deficits.  Patient to discharge at an ambulatory level Supervision.   Patient's care partner is independent to provide the necessary supervision assistance at discharge.  Reasons goals not met: n/a  Recommendation:  Patient will benefit from ongoing skilled PT services in home health setting to continue to advance safe functional mobility, address ongoing impairments in strength, balance, gait, and minimize fall risk.  Equipment: No equipment provided  Reasons for discharge: treatment goals met and discharge from hospital  Patient/family agrees with progress made and goals achieved: Yes  PT Discharge Precautions/Restrictions Precautions Precautions: Fall Restrictions Weight Bearing Restrictions: No Pain Pain Assessment Pain Scale: 0-10 Pain Score: 0-No pain Vision/Perception  Perception Perception: Within Functional Limits Praxis Praxis: Intact  Cognition Overall Cognitive Status: Within Functional Limits for tasks assessed Arousal/Alertness: Awake/alert Orientation Level: Oriented X4 Attention: Selective Selective  Attention: Appears intact Memory: Appears intact Awareness: Appears intact Problem Solving: Appears intact Initiating: Appears intact Safety/Judgment: Appears intact Sensation Sensation Light Touch: Impaired Detail Light Touch Impaired Details: Impaired RLE;Impaired LLE Hot/Cold: Appears Intact Proprioception: Appears Intact Stereognosis: Appears Intact Coordination Gross Motor Movements are Fluid and Coordinated: No Fine Motor Movements are Fluid and Coordinated: Yes Coordination and Movement Description: Pt still limited by fatigue, d/c at (S) level Finger Nose Finger Test: [facility] Motor  Motor Motor: Within Functional Limits Motor - Discharge Observations: Limited by fatigue/deconditioning   Mobility Bed Mobility Bed Mobility: Rolling Right;Rolling Left;Sit to Supine;Supine to Sit Rolling Right: Independent Rolling Left: Independent Supine to Sit: Independent Sit to Supine: Independent Scooting to HOB: Independent with assistive device Transfers Transfers: Sit to Stand;Stand Pivot Transfers Sit to Stand: Supervision/Verbal cueing Stand to Sit: Supervision/Verbal cueing Stand Pivot Transfers: Supervision/Verbal cueing Transfer (Assistive device): Rolling walker Locomotion  Gait Ambulation: Yes Gait Assistance: Supervision/Verbal cueing Gait Distance (Feet): 150 Feet Assistive device: Rolling walker Stairs / Additional Locomotion Ramp: Supervision/Verbal cueing Curb: Minimal Assistance - Patient >75%  Trunk/Postural Assessment  Cervical Assessment Cervical Assessment: (fwd head) Thoracic Assessment Thoracic Assessment: Within Functional Limits Lumbar Assessment Lumbar Assessment: (posterior pelvic tilt) Postural Control Postural Control: Deficits on evaluation Righting Reactions: Postural control has improved great but remains delayed  Balance Balance Balance Assessed: Yes Static Sitting Balance Static Sitting - Balance Support: Feet supported;No upper  extremity supported Static Sitting - Level of Assistance: 7: Independent Dynamic Sitting Balance Dynamic Sitting - Balance Support: Feet supported;No upper extremity supported Dynamic Sitting - Level of Assistance: 7: Independent Sitting balance - Comments: during dressing Static Standing Balance Static Standing - Balance Support: During functional activity Static Standing - Level of Assistance: 5: Stand by assistance Dynamic Standing Balance Dynamic Standing - Balance Support: During functional  activity;Bilateral upper extremity supported Dynamic Standing - Level of Assistance: 5: Stand by assistance Extremity Assessment  RUE Assessment RUE Assessment: Exceptions to [facility] General Strength Comments: generalized weakness, 4-/5 LUE Assessment LUE Assessment: Exceptions to [facility] General Strength Comments: 4-/5 RLE Assessment General Strength Comments: grossly 4/5 except hip abduction 3/5 LLE Assessment General Strength Comments: grossly 4/5 except hip abduction 3/5    [name] 04/27/2018, 10:17 AM

## 2018-04-27 NOTE — Plan of Care (Signed)
  Problem: Spiritual Needs Goal: Ability to function at adequate level Outcome: Progressing   Problem: Consults Goal: RH GENERAL PATIENT EDUCATION Description See Patient Education module for education specifics. Outcome: Progressing Goal: Skin Care Protocol Initiated - if Braden Score 18 or less Description If consults are not indicated, leave blank or document N/A Outcome: Progressing Goal: Nutrition Consult-if indicated Outcome: Progressing Goal: Diabetes Guidelines if Diabetic/Glucose > 140 Description If diabetic or lab glucose is > 140 mg/dl - Initiate Diabetes/Hyperglycemia Guidelines & Document Interventions  Outcome: Progressing   Problem: RH BOWEL ELIMINATION Goal: RH STG MANAGE BOWEL WITH ASSISTANCE Description STG Manage Bowel with min Assistance.  Outcome: Progressing Goal: RH STG MANAGE BOWEL W/MEDICATION W/ASSISTANCE Description STG Manage Bowel with Medication with min  Assistance.  Outcome: Progressing   Problem: RH SKIN INTEGRITY Goal: RH STG SKIN FREE OF INFECTION/BREAKDOWN Description Min A  Outcome: Progressing Goal: RH STG MAINTAIN SKIN INTEGRITY WITH ASSISTANCE Description STG Maintain Skin Integrity With min  Assistance.  Outcome: Progressing Goal: RH STG ABLE TO PERFORM INCISION/WOUND CARE W/ASSISTANCE Description STG Able To Perform Incision/Wound Care With min  Assistance.  Outcome: Progressing   Problem: RH SAFETY Goal: RH STG ADHERE TO SAFETY PRECAUTIONS W/ASSISTANCE/DEVICE Description STG Adhere to Safety Precautions With min  Assistance/Device.  Outcome: Progressing   Problem: RH PAIN MANAGEMENT Goal: RH STG PAIN MANAGED AT OR BELOW PT'S PAIN GOAL Description Less than 4  Outcome: Progressing   Problem: RH KNOWLEDGE DEFICIT GENERAL Goal: RH STG INCREASE KNOWLEDGE OF SELF CARE AFTER HOSPITALIZATION Description Patient and family will be able to verbalize self care in home setting with cues/handouts   Outcome: Progressing

## 2018-04-27 NOTE — Progress Notes (Signed)
Occupational Therapy Session Note  Patient Details  Name: [name] MRN: [id number] Date of Birth: [birth year]  Today's Date: 04/27/2018 OT Individual Time: 8270-7867 OT Individual Time Calculation (min): 31 min    Short Term Goals: Week 2:  OT Short Term Goal 1 (Week 2): STG=LTG d/t ELOS  Skilled Therapeutic Interventions/Progress Updates:    Pt greeted in w/c with spouse present. No c/o pain and amenable to tx. Started session with focus on dynamic balance, endurance, and functional ambulation with device during bedmaking. Pt transported linen from dresser to bed with instruction to drape linen over device. Pt removing B UEs to spread sheets onto bed and side-stepped with supervision-steady assist using RW. 1 LOB while attempting to don pillowcases in standing with Mod A to recover. She continued donning pillowcases while seated. Before donning blankets, pt reported urgent need to urinate. She completed ambulatory transfer with RW to toilet with steady assist and voided bladder after lowering clothing. There was a small amount of bleeding on belly with RN notified and in to assess. Pt was left with RN, applying pressure to belly to subside bleeding.     Therapy Documentation Precautions:  Precautions Precautions: Fall Precaution Comments: chest soreness from CPR, watch vitals  Restrictions Weight Bearing Restrictions: No Pain: Pain Assessment Pain Scale: 0-10 Pain Score: 0-No pain ADL: ADL Eating: Set up Where Assessed-Eating: Bed level Grooming: Supervision/safety Where Assessed-Grooming: Sitting at sink Upper Body Bathing: Setup Where Assessed-Upper Body Bathing: Shower Lower Body Bathing: Minimal cueing, Minimal assistance Where Assessed-Lower Body Bathing: Shower Upper Body Dressing: Setup Where Assessed-Upper Body Dressing: Edge of bed Lower Body Dressing: Minimal cueing, Contact guard Where Assessed-Lower Body Dressing: Edge of bed Toileting: Contact guard Where  Assessed-Toileting: Glass blower/designer: Therapist, music Method: Counselling psychologist: Energy manager: Unable to assess [employer] Transfer: Minimal cueing, Curator Method: Heritage manager: Other (comment)(BSC) Vision Baseline Vision/History: Wears glasses Wears Glasses: At all times Patient Visual Report: No change from baseline Vision Assessment?: No apparent visual deficits Perception  Perception: Within Functional Limits Praxis Praxis: Intact   Therapy/Group: Individual Therapy  Eleanor Dimichele A Mandy Peeks 04/27/2018, 11:45 AM

## 2018-04-27 NOTE — Discharge Summary (Signed)
Discharge summary job [id number]

## 2018-04-27 NOTE — Discharge Summary (Signed)
NAME: [name], [name] MEDICAL RECORD [id number] ACCOUNT [id number] DATE OF BIRTH:03-24-1941 FACILITY: [facility] LOCATION: [facility] PHYSICIAN:ZACHARY Naaman Plummer, MD  DISCHARGE SUMMARY  DATE OF DISCHARGE:  04/28/2018  DISCHARGE DIAGNOSES:   1.  Debilitation secondary to ventricular fibrillation cardiac arrest, status post stenting with ventilatory dependent respiratory failure.   2.  Subcutaneous heparin for deep venous thrombosis prophylaxis. 3.  Hypertension. 4.  Nonsustained ventricular tachycardia with bradycardia. 5.  Clostridium difficile. 6.  Diabetes mellitus. 7.  Peripheral neuropathy.   8.  History of coronary artery disease with coronary artery bypass grafting. 9 . Hyperlipidemia.   10.  Acute on chronic anemia. 11.  Hemorrhoids.  HOSPITAL COURSE:  This is a 77 year old right-handed female, history of irritable bowel syndrome, end-stage renal disease with hemodialysis.  Risks recently initiated 3 to 4 weeks ago, atrial fibrillation, CAD with CABG, hypertension, diabetes mellitus  as well as lumbar radiculopathy with L5-S1 microdiskectomy receiving inpatient rehabilitation services May 2017.  Per chart review lives with spouse, independent with assistive device prior to admission.  Daughter lives next door.    Presented 04/02/2018 after witnessed out of house cardiac arrest.  CPR initiated and EMS arrived within 10 minutes, found in V-fib and intubated in the field.  Noted systolic blood pressures in the 220s.  Troponin 1.17, lactic acid 5.11, WBC 18,900.   Placed on heparin drip.    Echocardiogram with ejection fraction of 50%, akinesis of the basal mid inferolateral and inferior myocardium, grade I diastolic dysfunction.  Placed on broad spectrum antibiotics for elevated WBC.  Question aspiration pneumonia.  Cardiology service  followup cardiac catheterization 04/08/2018 showed proximal LCX lesion, 100% stenosis, proximal LAD 95% stenosis.  Underwent successful stenting, maintained  on aspirin as well as Brilinta.  Hemodialysis ongoing.  HOSPITAL COURSE:  Positive Clostridium difficile.  A rectal tube in place.  Contact precautions.  Completing course of vancomycin.  Subcutaneous heparin for DVT prophylaxis.  She remained intubated through 04/04/2018.  Episode of bradycardia 04/10/2018.   Remained asymptomatic.  Follow up per cardiology services.  The patient was admitted for comprehensive rehabilitation program.  PAST MEDICAL HISTORY:  See discharge diagnoses.  SOCIAL HISTORY:  Lives with spouse, independent with assistive device prior to admission.  FUNCTIONAL STATUS:  Upon admission to rehab services was minimal guard, stand pivot transfers, moderate assist ambulation, minimal assist sit to supine, minimal assist upper body, max assist lower body ADLs.  PHYSICAL EXAMINATION: VITAL SIGNS:  Blood pressure 122/47, pulse 61, temperature 98, respirations 16. GENERAL:  Alert female in no acute distress. HEENT:  EOMs intact. NECK:  Supple, nontender, no JVD. CARDIOVASCULAR:  Rate controlled. ABDOMEN:  Soft, nontender, good bowel sounds. LUNGS:  Clear to auscultation. NEUROLOGIC:  Motor strength 4-/5 grossly throughout.  REHABILITATION HOSPITAL COURSE:  The patient was admitted to inpatient rehabilitation services.  Therapies initiated on a 3-hour daily basis, consisting of physical therapy, occupational therapy and rehabilitation nursing.  The following issues were  addressed during patient's rehabilitation stay:  Pertaining to the patient's debility related to atrial fibrillation cardiac arrest, she had undergone stenting, remained intubated for a time.  Follow up per cardiology services.  She was attending full  therapies.    Subcutaneous heparin for DVT prophylaxis.  No bleeding episodes.    Pain management with the use of Tylenol.    Mood stabilization.  She would remain off her Celexa avoiding QT prolonging agents at the discretion of cardiology services.     Blood pressures remain controlled.  Again, follow up per  cardiology services.  No chest pain or increasing shortness of breath.    She remained on contact precautions for Clostridium difficile.  Bowel program.  Continued to improve.  Bulk adding agents noted.  Follow up C. diff specimen negative.    She remained on hemodialysis as follow up per renal services.    Blood sugars overall controlled with Lantus insulin as directed.    Acute on chronic anemia 8.4.  She remained on Aranesp.    Noted hemorrhoid related to her Clostridium difficile.  Improved with the use of Anusol suppository.  No sitz bath was initiated due to moisture irritated skin.    The patient received weekly collaborative interdisciplinary team conferences to discuss estimated length of stay, family teaching, any barriers to her discharge.  She required contact guard assistance.  Toilet transfers, ambulating 150 feet x2 close  supervision.  Working with energy conservation techniques.  Perform bathing and dressing at wheelchair level and sink with primary supervision and modest verbal cues.  Needed minimal assist to thread her pants and wash the bottoms of her legs.  Full  family teaching was completed and planned discharge to home.  DISCHARGE MEDICATIONS:  Included aspirin 81 mg p.o. daily, Lipitor 40 mg p.o. daily, B complex 1 tablet daily, Alphagan ophthalmic solution 1 drop t.i.d. left eye, Aranesp weekly with hemodialysis.  Hydralazine 25 mg p.o. q. 8h.  Anusol suppository twice  daily, Lantus insulin 15 units subcutaneous daily, Xalatan ophthalmic solution 1 drop at bedtime both eyes.  FiberCon daily, Florastor 250 mg p.o. b.i.d., Renvela 800 mg p.o. t.i.d., Brilinta 90 mg p.o. b.i.d., Timoptic ophthalmic solution 0.5% 1 drop  daily left eye.  DIET:  Diabetic renal diet.  The patient will be followed by Dr. Alger Simons at the outpatient rehab service office as directed; Dr. Irish Lack, cardiology services.  Call for  appointment.  Dr. Roney Jaffe, renal services; Dr. Josetta Huddle, medical management.  AN/NUANCE D:04/27/2018 T:04/27/2018 JOB:[id number]

## 2018-04-28 LAB — GLUCOSE, CAPILLARY
GLUCOSE-CAPILLARY: 79 mg/dL (ref 70–99)
Glucose-Capillary: 118 mg/dL — ABNORMAL HIGH (ref 70–99)
Glucose-Capillary: 69 mg/dL — ABNORMAL LOW (ref 70–99)

## 2018-04-28 LAB — RENAL FUNCTION PANEL
ALBUMIN: 2.4 g/dL — AB (ref 3.5–5.0)
Anion gap: 11 (ref 5–15)
BUN: 37 mg/dL — AB (ref 8–23)
CALCIUM: 10 mg/dL (ref 8.9–10.3)
CO2: 26 mmol/L (ref 22–32)
Chloride: 101 mmol/L (ref 98–111)
Creatinine, Ser: 5.47 mg/dL — ABNORMAL HIGH (ref 0.44–1.00)
GFR calc Af Amer: 8 mL/min — ABNORMAL LOW (ref >60)
GFR calc non Af Amer: 7 mL/min — ABNORMAL LOW (ref >60)
GLUCOSE: 101 mg/dL — AB (ref 70–99)
PHOSPHORUS: 3.9 mg/dL (ref 2.5–4.6)
Potassium: 4.5 mmol/L (ref 3.5–5.1)
SODIUM: 138 mmol/L (ref 135–145)

## 2018-04-28 LAB — CBC WITH DIFFERENTIAL/PLATELET
Abs Immature Granulocytes: 0.09 10*3/uL — ABNORMAL HIGH (ref 0.00–0.07)
BASOS ABS: 0.1 10*3/uL (ref 0.0–0.1)
BASOS PCT: 1 %
Eosinophils Absolute: 0.7 10*3/uL — ABNORMAL HIGH (ref 0.0–0.5)
Eosinophils Relative: 9 %
HCT: 27.8 % — ABNORMAL LOW (ref 36.0–46.0)
HEMOGLOBIN: 8.6 g/dL — AB (ref 12.0–15.0)
Immature Granulocytes: 1 %
Lymphocytes Relative: 22 %
Lymphs Abs: 1.6 10*3/uL (ref 0.7–4.0)
MCH: 33.9 pg (ref 26.0–34.0)
MCHC: 30.9 g/dL (ref 30.0–36.0)
MCV: 109.4 fL — ABNORMAL HIGH (ref 80.0–100.0)
Monocytes Absolute: 0.7 10*3/uL (ref 0.1–1.0)
Monocytes Relative: 10 %
NEUTROS ABS: 4 10*3/uL (ref 1.7–7.7)
NEUTROS PCT: 57 %
NRBC: 0.3 % — AB (ref 0.0–0.2)
Platelets: 216 10*3/uL (ref 150–400)
RBC: 2.54 MIL/uL — AB (ref 3.87–5.11)
RDW: 14.6 % (ref 11.5–15.5)
WBC: 7.1 10*3/uL (ref 4.0–10.5)

## 2018-04-28 MED ORDER — TICAGRELOR 90 MG PO TABS: 90.0000 mg | ORAL_TABLET | Freq: Two times a day (BID) | ORAL | 1 refills | Status: DC

## 2018-04-28 MED ORDER — SACCHAROMYCES BOULARDII 250 MG PO CAPS: 250.0000 mg | ORAL_CAPSULE | Freq: Two times a day (BID) | ORAL | 0 refills | Status: DC

## 2018-04-28 MED ORDER — CALCIUM CARBONATE ANTACID 500 MG PO CHEW: 1.0000 | CHEWABLE_TABLET | Freq: Four times a day (QID) | ORAL | Status: DC | PRN

## 2018-04-28 MED ORDER — GERHARDT'S BUTT CREAM: 5.0000 "application " | TOPICAL_CREAM | CUTANEOUS | 1 refills | Status: DC | PRN

## 2018-04-28 MED ORDER — INSULIN GLARGINE 100 UNIT/ML SOLOSTAR PEN: 15.0000 [IU] | PEN_INJECTOR | Freq: Every day | SUBCUTANEOUS | 11 refills | Status: DC

## 2018-04-28 MED ORDER — HYDRALAZINE HCL 25 MG PO TABS: 25.0000 mg | ORAL_TABLET | Freq: Three times a day (TID) | ORAL | 0 refills | Status: DC

## 2018-04-28 MED ORDER — ATORVASTATIN CALCIUM 40 MG PO TABS: 40.0000 mg | ORAL_TABLET | Freq: Every day | ORAL | 1 refills | Status: DC

## 2018-04-28 MED ORDER — CALCIUM POLYCARBOPHIL 625 MG PO TABS: 625.0000 mg | ORAL_TABLET | Freq: Every day | ORAL | 0 refills | Status: DC

## 2018-04-28 MED ORDER — SEVELAMER CARBONATE 800 MG PO TABS: 800.0000 mg | ORAL_TABLET | Freq: Three times a day (TID) | ORAL | 0 refills | Status: DC

## 2018-04-28 MED ORDER — HYDROCORTISONE ACETATE 25 MG RE SUPP: 25.0000 mg | Freq: Two times a day (BID) | RECTAL | 0 refills | Status: DC

## 2018-04-28 NOTE — Procedures (Signed)
   I was present at this dialysis session, have reviewed the session itself and made  appropriate changes [name] MD [place] pager [phone]   04/28/2018, 1:50 PM

## 2018-04-28 NOTE — Progress Notes (Signed)
[name]     PROGRESS NOTE  Subjective/Complaints:  In good spirits. Thrilled to be leaving today  ROS: Patient denies fever, rash, sore throat, blurred vision, nausea, vomiting, diarrhea, cough, shortness of breath or chest pain, joint or back pain, headache, or mood change. .   Objective: Vital Signs: Blood pressure (!) 155/61, pulse 99, temperature 98.3 F (36.8 C), temperature source Oral, resp. rate 18, height 5\' 5"  (1.651 m), weight 75 kg, SpO2 100 %. No results found. Recent Labs    04/25/18 1322  WBC 7.0  HGB 8.4*  HCT 27.2*  PLT 215   Recent Labs    04/25/18 1322  NA 134*  K 3.6  CL 98  GLUCOSE 214*  BUN 35*  CREATININE 4.91*  CALCIUM 9.6   CBG (last 3)  Recent Labs    04/27/18 1641 04/27/18 2233 04/28/18 0643  GLUCAP 165* 118* 79    Wt Readings from Last 3 Encounters:  04/27/18 75 kg  04/15/18 72.8 kg  01/28/18 77.1 kg    Physical Exam:  BP (!) 155/61 (BP Location: Right Arm)   Pulse 99   Temp 98.3 F (36.8 C) (Oral)   Resp 18   Ht 5\' 5"  (1.651 m)   Wt 75 kg   SpO2 100%   BMI 27.51 kg/m  Constitutional: No distress . Vital signs reviewed. HEENT: EOMI, oral membranes moist Neck: supple Cardiovascular: RRR without murmur. No JVD    Respiratory: CTA Bilaterally without wheezes or rales. Normal effort    GI: BS +, non-tender, non-distended  Musculoskeletal: tr LE edema Neurological: She is alert.  A&O x 3.  Motor: 4/5 grossly throughout Skin: intact, decreased buttock irritation  Psychiatric: in good spirits  Assessment/Plan: 1. Functional deficits secondary to debility which require 3+ hours per day of interdisciplinary therapy in a comprehensive inpatient rehab setting. Physiatrist is providing close team supervision and 24 hour management of active medical problems listed below. Physiatrist and rehab team continue to assess barriers to discharge/monitor patient progress toward functional and  medical goals.  Function:  Bathing Bathing position      Bathing parts      Bathing assist        Upper Body Dressing/Undressing Upper body dressing                    Upper body assist        Lower Body Dressing/Undressing Lower body dressing                                  Lower body assist        Toileting Toileting          Toileting assist     Transfers Chair/bed transfer             Locomotion Ambulation           Wheelchair          Cognition Comprehension    Expression    Social Interaction    Problem Solving    Memory      Medical Problem List and Plan: 1.  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    2.  DVT Prophylaxis/Anticoagulation: Subcutaneous heparin.  Monitor for any bleeding  episodes 3. Pain Management: Tylenol as needed 4. Mood: Provide emotional support.  Off of Celexa since hospitalization.  Avoiding QT prolonging agents 5. Neuropsych: This patient is ?fully capable of making decisions on her own behalf. 6. Skin/Wound Care: Routine skin checks  -skin healing 7. Fluids/Electrolytes/Nutrition: improved PO intake (50-75%) 8.  Hypertension.  Hydralazine 25 mg every 8 hours   Vitals:   04/27/18 2208 04/28/18 0630  BP: (!) 126/51 (!) 155/61  Pulse: 81 99  Resp: 18   Temp: (!) 97.5 F (36.4 C) 98.3 F (36.8 C)  SpO2: 100%    9.  Nonsustained V. Tach/bradycardia.  Patient is asymptomatic.  Avoid QT prolonging agents.  Follow-up per cardiology services.  Patient is DNR 10.  C. difficile.    Dramatically improved  11.  End-stage renal disease with hemodialysis.  Hemodialysis recently initiated 3 to 4 weeks ago.    -HD in afternoons after therapy. T TH S, no hypotension during dialysis  -electrolyte/volume mgt per nephrology 12.   Diabetes mellitus with peripheral neuropathy.  Lantus insulin 15 units daily.     -continue SSI   -improved 10/15  13.  History of CAD with CABG 2013.  Continue aspirin 14.  Hyperlipidemia.  Lipitor 15.  Acute on chronic anemia.  Continue Aranesp  Hemoglobin  stable at 8.4 on 04/25/2018 folow up with HD 16.  Hemorrhoids  --improved   -PRN anusol suppository      LOS (Days) 13 A FACE TO FACE EVALUATION WAS PERFORMED  Meredith Staggers 04/28/2018 8:56 AM

## 2018-04-28 NOTE — Progress Notes (Signed)
Discharge Note  The overall goal for the admission was met for:   Discharge location: Yes - home with spouse who can provide 24/7 supervision  Length of Stay: Yes - 13 days  Discharge activity level: Yes -supervision/ min assist  Home/community participation: Yes  Services provided included: MD, RD, PT, OT, RN, CM, TR, Pharmacy, Neuropsych and SW  Financial Services: Medicare and Private Insurance: [employer]  Follow-up services arranged: Home Health: RN, PT, OT via [facility] and Patient/Family request agency HH: [facility], DME: NA  -had all needed DME  Comments (or additional information):  Patient/Family verbalized understanding of follow-up arrangements: Yes  Individual responsible for coordination of the follow-up plan: pt  Confirmed correct DME delivered: NA - had DME already    Sherian Valenza

## 2018-04-28 NOTE — Progress Notes (Signed)
Discharged to home accompanied by spouse. Discharge instructions given by Alcide Goodness, PAC and no questions noted. Family packed belongings. Taken out in wheelchair. Margarito Liner

## 2018-04-29 ENCOUNTER — Telehealth: Payer: Self-pay

## 2018-04-29 DIAGNOSIS — Z8673 Personal history of transient ischemic attack (TIA), and cerebral infarction without residual deficits: Secondary | ICD-10-CM | POA: Diagnosis not present

## 2018-04-29 DIAGNOSIS — Z992 Dependence on renal dialysis: Secondary | ICD-10-CM | POA: Diagnosis not present

## 2018-04-29 DIAGNOSIS — F329 Major depressive disorder, single episode, unspecified: Secondary | ICD-10-CM | POA: Diagnosis not present

## 2018-04-29 DIAGNOSIS — Z7982 Long term (current) use of aspirin: Secondary | ICD-10-CM | POA: Diagnosis not present

## 2018-04-29 DIAGNOSIS — Z8701 Personal history of pneumonia (recurrent): Secondary | ICD-10-CM | POA: Diagnosis not present

## 2018-04-29 DIAGNOSIS — Z794 Long term (current) use of insulin: Secondary | ICD-10-CM | POA: Diagnosis not present

## 2018-04-29 DIAGNOSIS — I472 Ventricular tachycardia: Secondary | ICD-10-CM | POA: Diagnosis not present

## 2018-04-29 DIAGNOSIS — E119 Type 2 diabetes mellitus without complications: Secondary | ICD-10-CM

## 2018-04-29 DIAGNOSIS — M5416 Radiculopathy, lumbar region: Secondary | ICD-10-CM | POA: Diagnosis not present

## 2018-04-29 DIAGNOSIS — I1311 Hypertensive heart and chronic kidney disease without heart failure, with stage 5 chronic kidney disease, or end stage renal disease: Secondary | ICD-10-CM | POA: Diagnosis not present

## 2018-04-29 DIAGNOSIS — E1122 Type 2 diabetes mellitus with diabetic chronic kidney disease: Secondary | ICD-10-CM | POA: Diagnosis not present

## 2018-04-29 DIAGNOSIS — G931 Anoxic brain damage, not elsewhere classified: Secondary | ICD-10-CM | POA: Diagnosis not present

## 2018-04-29 DIAGNOSIS — Z79899 Other long term (current) drug therapy: Secondary | ICD-10-CM | POA: Diagnosis not present

## 2018-04-29 DIAGNOSIS — Z85820 Personal history of malignant melanoma of skin: Secondary | ICD-10-CM | POA: Diagnosis not present

## 2018-04-29 DIAGNOSIS — D649 Anemia, unspecified: Secondary | ICD-10-CM | POA: Diagnosis not present

## 2018-04-29 DIAGNOSIS — Z7902 Long term (current) use of antithrombotics/antiplatelets: Secondary | ICD-10-CM | POA: Diagnosis not present

## 2018-04-29 DIAGNOSIS — E1142 Type 2 diabetes mellitus with diabetic polyneuropathy: Secondary | ICD-10-CM | POA: Diagnosis not present

## 2018-04-29 DIAGNOSIS — Z951 Presence of aortocoronary bypass graft: Secondary | ICD-10-CM | POA: Diagnosis not present

## 2018-04-29 DIAGNOSIS — H9190 Unspecified hearing loss, unspecified ear: Secondary | ICD-10-CM | POA: Diagnosis not present

## 2018-04-29 DIAGNOSIS — A0472 Enterocolitis due to Clostridium difficile, not specified as recurrent: Secondary | ICD-10-CM | POA: Diagnosis not present

## 2018-04-29 DIAGNOSIS — N186 End stage renal disease: Secondary | ICD-10-CM | POA: Diagnosis not present

## 2018-04-29 DIAGNOSIS — Z955 Presence of coronary angioplasty implant and graft: Secondary | ICD-10-CM | POA: Diagnosis not present

## 2018-04-29 DIAGNOSIS — I2510 Atherosclerotic heart disease of native coronary artery without angina pectoris: Secondary | ICD-10-CM | POA: Diagnosis not present

## 2018-04-29 DIAGNOSIS — Z8674 Personal history of sudden cardiac arrest: Secondary | ICD-10-CM | POA: Diagnosis not present

## 2018-04-29 DIAGNOSIS — H409 Unspecified glaucoma: Secondary | ICD-10-CM | POA: Diagnosis not present

## 2018-04-29 NOTE — Telephone Encounter (Signed)
She didn't receive these because they are OTC. There are NUMEROUS types and sizes as well. If they go to the pharmacy and it will help if we write an rx, have her call back and let us know what size/type she needs. (I'm sorry but I don't know what size she was using)

## 2018-04-29 NOTE — Telephone Encounter (Signed)
Patient called, stated did not receive the pen needles with the insulin lantus solostar pen system they recieved.  Please advise on the type of needles that need to be ordered for this system.

## 2018-04-30 ENCOUNTER — Telehealth: Payer: Self-pay

## 2018-04-30 DIAGNOSIS — N186 End stage renal disease: Secondary | ICD-10-CM | POA: Diagnosis not present

## 2018-04-30 DIAGNOSIS — D649 Anemia, unspecified: Secondary | ICD-10-CM | POA: Diagnosis not present

## 2018-04-30 MED ORDER — PEN NEEDLES 31G X 8 MM MISC: 15.0000 [IU] | Freq: Every day | 9 refills | Status: DC

## 2018-04-30 NOTE — Telephone Encounter (Signed)
Transitional Care call  Patient name: [name]) DOB: (September 10, 1940) 1. Are you/is patient experiencing any problems since coming home? (NO) a. Are there any questions regarding any aspect of care? (NO) 2. Are there any questions regarding medications administration/dosing? (NO) a. Are meds being taken as prescribed? (NO, WORKING ON ACQUIRING PEN NEEDLES FOR LANTIS SOLOSTAR PEN, ALREADY BEING HANDLED BY PROVIDER.) b. "Patient should review meds with caller to confirm"  3. Have there been any falls? (NO) 4. Has Home Health been to the house and/or have they contacted you? (YES) a. If not, have you tried to contact them? (NA) b. Can we help you contact them? (NA) 5. Are bowels and bladder emptying properly? (YES) a. Are there any unexpected incontinence issues? (NA) b. If applicable, is patient following bowel/bladder programs? (NA) 6. Any fevers, problems with breathing, unexpected pain? (NO) 7. Are there any skin problems or new areas of breakdown? (NO) 8. Has the patient/family member arranged specialty MD follow up (ie cardiology/neurology/renal/surgical/etc.)?  (YES) a. Can we help arrange? (NA) 9. Does the patient need any other services or support that we can help arrange? (NO) 10. Are caregivers following through as expected in assisting the patient? (YES) 11. Has the patient quit smoking, drinking alcohol, or using drugs as recommended? (NA)  Appointment date/time (05-11-2018 / 2:20pm), arrive time (2:00) and who it is with here (Dr. Posey Pronto) [place]

## 2018-04-30 NOTE — Telephone Encounter (Signed)
rx for 70mm needles sent to pharmacy.

## 2018-04-30 NOTE — Telephone Encounter (Signed)
Called and informed patient. 

## 2018-04-30 NOTE — Telephone Encounter (Signed)
Called and verified with pharmacy, yes they do sell them OTC, but are also expensive as a box of 100 is at minimal $50.00 +  They did recommend using a prescription for them as insurance typically helps with that price.  According to the manufacturer of the solostar pen:  THE LANTUS SOLOSTAR INSULIN PEN CAN BE USED WITH THE BD ULTRA-FINE PEN NEEDLES*.  Your healthcare provider may prescribe Ultra-Fine pen needles. They come in the following sizes:    4 mm (5/32) 32G Nano NDC/HRI No. [id number]   5 mm (3/16) 31G Mini NDC/HRI No. [id number]   8 mm (5/16) 31G Short NDC/HRI No. [id number]   *Ultra-Fine is a [employer] of [employer].   The pharmacy did say also that in the prescription, there must be an ICD code and exact amounts of  daily usage of the pen and needles.

## 2018-05-01 DIAGNOSIS — I1311 Hypertensive heart and chronic kidney disease without heart failure, with stage 5 chronic kidney disease, or end stage renal disease: Secondary | ICD-10-CM | POA: Diagnosis not present

## 2018-05-01 DIAGNOSIS — Z8674 Personal history of sudden cardiac arrest: Secondary | ICD-10-CM | POA: Diagnosis not present

## 2018-05-01 DIAGNOSIS — G931 Anoxic brain damage, not elsewhere classified: Secondary | ICD-10-CM | POA: Diagnosis not present

## 2018-05-01 DIAGNOSIS — I2510 Atherosclerotic heart disease of native coronary artery without angina pectoris: Secondary | ICD-10-CM | POA: Diagnosis not present

## 2018-05-01 DIAGNOSIS — E1142 Type 2 diabetes mellitus with diabetic polyneuropathy: Secondary | ICD-10-CM | POA: Diagnosis not present

## 2018-05-01 DIAGNOSIS — E1122 Type 2 diabetes mellitus with diabetic chronic kidney disease: Secondary | ICD-10-CM | POA: Diagnosis not present

## 2018-05-02 DIAGNOSIS — N186 End stage renal disease: Secondary | ICD-10-CM | POA: Diagnosis not present

## 2018-05-02 DIAGNOSIS — D649 Anemia, unspecified: Secondary | ICD-10-CM | POA: Diagnosis not present

## 2018-05-04 ENCOUNTER — Telehealth: Payer: Self-pay

## 2018-05-04 DIAGNOSIS — E1122 Type 2 diabetes mellitus with diabetic chronic kidney disease: Secondary | ICD-10-CM | POA: Diagnosis not present

## 2018-05-04 DIAGNOSIS — Z8674 Personal history of sudden cardiac arrest: Secondary | ICD-10-CM | POA: Diagnosis not present

## 2018-05-04 DIAGNOSIS — I1311 Hypertensive heart and chronic kidney disease without heart failure, with stage 5 chronic kidney disease, or end stage renal disease: Secondary | ICD-10-CM | POA: Diagnosis not present

## 2018-05-04 DIAGNOSIS — E1142 Type 2 diabetes mellitus with diabetic polyneuropathy: Secondary | ICD-10-CM | POA: Diagnosis not present

## 2018-05-04 DIAGNOSIS — G931 Anoxic brain damage, not elsewhere classified: Secondary | ICD-10-CM | POA: Diagnosis not present

## 2018-05-04 DIAGNOSIS — I2510 Atherosclerotic heart disease of native coronary artery without angina pectoris: Secondary | ICD-10-CM | POA: Diagnosis not present

## 2018-05-04 NOTE — Telephone Encounter (Signed)
Patients representative called today, states that she has developed loose stools again and is concerned since patient had C-diff while in hospital.  Please advise on what to do for this patient.

## 2018-05-05 DIAGNOSIS — D649 Anemia, unspecified: Secondary | ICD-10-CM | POA: Diagnosis not present

## 2018-05-05 DIAGNOSIS — N186 End stage renal disease: Secondary | ICD-10-CM | POA: Diagnosis not present

## 2018-05-05 DIAGNOSIS — E0869 Diabetes mellitus due to underlying condition with other specified complication: Secondary | ICD-10-CM | POA: Diagnosis not present

## 2018-05-05 NOTE — Telephone Encounter (Signed)
I recommend that the [facility] collect another specimen for C diff.   Also make sure she's on no softeners or laxatives  Should be continuing on probiotic and fiber

## 2018-05-06 ENCOUNTER — Encounter: Payer: Self-pay | Admitting: Cardiology

## 2018-05-06 ENCOUNTER — Ambulatory Visit (INDEPENDENT_AMBULATORY_CARE_PROVIDER_SITE_OTHER): Payer: Medicare Other | Admitting: Cardiology

## 2018-05-06 VITALS — BP 112/56 | HR 82 | Ht 65.0 in | Wt 169.1 lb

## 2018-05-06 DIAGNOSIS — I2510 Atherosclerotic heart disease of native coronary artery without angina pectoris: Secondary | ICD-10-CM

## 2018-05-06 DIAGNOSIS — Z9861 Coronary angioplasty status: Secondary | ICD-10-CM | POA: Diagnosis not present

## 2018-05-06 DIAGNOSIS — G931 Anoxic brain damage, not elsewhere classified: Secondary | ICD-10-CM | POA: Diagnosis not present

## 2018-05-06 DIAGNOSIS — I5042 Chronic combined systolic (congestive) and diastolic (congestive) heart failure: Secondary | ICD-10-CM

## 2018-05-06 DIAGNOSIS — E1122 Type 2 diabetes mellitus with diabetic chronic kidney disease: Secondary | ICD-10-CM | POA: Diagnosis not present

## 2018-05-06 DIAGNOSIS — Z8674 Personal history of sudden cardiac arrest: Secondary | ICD-10-CM | POA: Diagnosis not present

## 2018-05-06 DIAGNOSIS — I10 Essential (primary) hypertension: Secondary | ICD-10-CM | POA: Diagnosis not present

## 2018-05-06 DIAGNOSIS — E1142 Type 2 diabetes mellitus with diabetic polyneuropathy: Secondary | ICD-10-CM | POA: Diagnosis not present

## 2018-05-06 DIAGNOSIS — I6523 Occlusion and stenosis of bilateral carotid arteries: Secondary | ICD-10-CM

## 2018-05-06 DIAGNOSIS — I1311 Hypertensive heart and chronic kidney disease without heart failure, with stage 5 chronic kidney disease, or end stage renal disease: Secondary | ICD-10-CM | POA: Diagnosis not present

## 2018-05-06 MED ORDER — TICAGRELOR 90 MG PO TABS: 90.0000 mg | ORAL_TABLET | Freq: Two times a day (BID) | ORAL | 3 refills | Status: DC

## 2018-05-06 MED ORDER — NITROGLYCERIN 0.4 MG SL SUBL: 0.4000 mg | SUBLINGUAL_TABLET | SUBLINGUAL | 3 refills | Status: DC | PRN

## 2018-05-06 MED ORDER — HYDRALAZINE HCL 50 MG PO TABS: 50.0000 mg | ORAL_TABLET | Freq: Two times a day (BID) | ORAL | 3 refills | Status: DC

## 2018-05-06 NOTE — Progress Notes (Signed)
05/06/2018 [name]   Mar 20, 1941  [id number]  Primary Physician Josetta Huddle, MD Primary Cardiologist: Dr. Irish Lack  Electrophysiologist: Seen by Dr. Curt Bears in consult only 04/10/18 (no plans to f/u w/ EP)  Reason for Visit/CC: Post hospital f/u for cardiac arrest, NSTEMI s/p PCI  HPI: [name] is a 77 y/o female with CAD who presents to clinic today for post hospital f/u after cardiac arrest.   Her history includes end-stage renal disease on HD, coronary artery disease status post remote bypass surgery and diabetes.    Pt had wittnessed out of hospital cardiac arrest on 04/02/18. Upon EMS arrival initial rhythm was reportedly VF, for which she was defibrillated, but was then asystolic.  She ultimately regained spontaneous circulation following approximately 12 to 13 minutes of downtime.  She was then brought to the [facility] ED for further evaluation.  In the ED, she was intubated for airway protection.  Twelve-lead ECG showed ventricular bigeminy.  There were no apparent acute ischemic changes.  Initial labs showed no notable electrolyte disturbances.  Her lactate was 5 on presentation, and initial troponin I was 0.2.  She was admitted to the pulmonary critical care service. Troponin peaked at 1.1. Echocardiogram showed decreased LVEF at 40 to 45% with inferolateral wall motion abnormalities. She underwent LHC on 9/25 and had PCI to occluded SVR-RCA, which was felt to be the culprit vessel. She was placed on DAPT w/ ASA and Brilinta, to be continued x 12 months. She was evaluated by EP for consideration for ICD. Given she had underwent PCI to occluded SVG and had had no recurrent ventricular arrhythmias post revascularization, there was felt to be no need for ICD. She continued to do well from a cardiac standpoint. Her hospitalization however was complicated by aspiration PNA and she was treated with antibiotics. Pt also had a couple 3-4 second asymptomatic sinus pauses in the setting of  cough spell. Suspect vagal episode. However, her BB was discontinued. Once stable from a medical standpoint, she was transition to inpatient rehab for therapy. She did well and was eventually discharged.   She is back today for post hospital f/u. She is here with her husband. She is doing well. No angina. No dyspnea. She is getting home health services. She reports full compliance w/ ASA and Brilinta. Going to HD T, Th, sat. BP drops at HD and she often has to end her sessions early. she is only on 1 antihypertensive medication, hydralazine 25 mg TID.     Cardiac Studies Procedures   CORONARY STENT INTERVENTION 04/08/18  LEFT HEART CATH AND CORS/GRAFTS ANGIOGRAPHY  Conclusion     Ost 1st Mrg lesion is 75% stenosed.  Prox Cx lesion is 100% stenosed. Left to left collaterals. Small AV groove circumflex.  Ost 1st Diag lesion is 100% stenosed. SVG to diag is patent.  Prox LAD lesion is 95% stenosed. LIMA to LAD is patent.  Prox RCA lesion is 95% stenosed. Mid Graft lesion is 95% stenosed in SVG to PLA.  A drug-eluting stent was successfully placed using a STENT RESOLUTE ONYX 2.0X15.  Post intervention, there is a 0% residual stenosis.  LV end diastolic pressure is normal.  There is no aortic valve stenosis.  Calcified femoral artery.     Recommend uninterrupted dual antiplatelet therapy with Aspirin 81mg  daily and Ticagrelor 90mg  twice daily for a minimum of 12 months (ACS - Class I recommendation).    2D Echo 04/03/18 Study Conclusions  - Left ventricle: The cavity  size was normal. Wall thickness was   increased in a pattern of mild LVH. Systolic function was mildly   reduced. The estimated ejection fraction was in the range of 45%   to 50%. There is akinesis of the basal-midinferolateral and   inferior myocardium. Doppler parameters are consistent with   abnormal left ventricular relaxation (grade 1 diastolic   dysfunction). - Mitral valve: Calcified annulus. There  was mild regurgitation.  Impressions:  - Akinesis of the basal/mid inferior and inferolateral walls with   overall mildly reduced LV systolic function; mild diastolic   dysfunction; mild LVH; mild MR.  Current Meds  Medication Sig  . acetaminophen (TYLENOL) 325 MG tablet Take 2 tablets (650 mg total) by mouth every 6 (six) hours as needed for mild pain.  Marland Kitchen allopurinol (ZYLOPRIM) 100 MG tablet Take 200 mg by mouth daily.   Marland Kitchen aspirin EC 81 MG tablet Take 1 tablet (81 mg total) by mouth daily.  Marland Kitchen atorvastatin (LIPITOR) 40 MG tablet Take 1 tablet (40 mg total) by mouth daily at 6 PM.  . b complex-C-folic acid 1 MG capsule Take 1 capsule by mouth daily.  . brimonidine (ALPHAGAN P) 0.1 % SOLN Place 1 drop into the left eye 2 (two) times daily.  . calcium carbonate (TUMS - DOSED IN MG ELEMENTAL CALCIUM) 500 MG chewable tablet Chew 1 tablet (200 mg of elemental calcium total) by mouth every 6 (six) hours as needed for indigestion or heartburn.  . Cholecalciferol (VITAMIN D) 2000 UNITS tablet Take 2,000 Units by mouth 2 (two) times daily.  . colchicine 0.6 MG tablet Take 0.6 mg by mouth 2 (two) times daily as needed (for gout flares).   . Cyanocobalamin (B-12) 5000 MCG CAPS Take 5,000 mcg by mouth daily.  . hydrALAZINE (APRESOLINE) 25 MG tablet Take 1 tablet (25 mg total) by mouth every 8 (eight) hours.  . Hydrocortisone (GERHARDT'S BUTT CREAM) CREA Apply 5 application topically as needed for irritation.  . Insulin Glargine (LANTUS) 100 UNIT/ML Solostar Pen Inject 15 Units into the skin daily.  . Insulin Pen Needle (PEN NEEDLES) 31G X 8 MM MISC 15 Units by Does not apply route daily.  . nitroGLYCERIN (NITROSTAT) 0.4 MG SL tablet Place 0.4 mg under the tongue every 5 (five) minutes as needed for chest pain.   . Omega-3 Fatty Acids (FISH OIL) 1200 MG CAPS Take 1,200 mg by mouth 2 (two) times daily.  Marland Kitchen omeprazole (PRILOSEC) 20 MG capsule Take 20 mg by mouth daily as needed (acid reflux).   .  polycarbophil (FIBERCON) 625 MG tablet Take 1 tablet (625 mg total) by mouth daily.  Marland Kitchen PROCTOSOL HC 2.5 % rectal cream Place 1 application rectally 2 (two) times daily as needed for hemorrhoids.   . saccharomyces boulardii (FLORASTOR) 250 MG capsule Take 1 capsule (250 mg total) by mouth 2 (two) times daily.  . sevelamer carbonate (RENVELA) 800 MG tablet Take 1 tablet (800 mg total) by mouth 3 (three) times daily with meals.  . ticagrelor (BRILINTA) 90 MG TABS tablet Take 1 tablet (90 mg total) by mouth 2 (two) times daily.  . timolol (TIMOPTIC) 0.5 % ophthalmic solution Place 1 drop into the left eye daily.  . travoprost, benzalkonium, (TRAVATAN) 0.004 % ophthalmic solution Place 1 drop into both eyes at bedtime.    Allergies  Allergen Reactions  . Gabapentin Other (See Comments)    Hallucinations and "Makes me go crazy"   . Sulfa Antibiotics Other (See Comments)  Altered mental state   . Amlodipine Other (See Comments)    "Makes my legs swell"; edema   Past Medical History:  Diagnosis Date  . Anemia   . Arthritis   . Atrial fibrillation ([place])   . Blood transfusion   . CAD (coronary artery disease)   . Cancer ([place])    .  top of head- melonoma  . Carotid artery occlusion    Carotid Endartectom,y - left 2009.  Blockage Right being watched by Dr Scot Dock.  . Carotid stenosis   . 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 Complication of anesthesia    pt. states that she was difficult to wake  . Depression   . Diabetes mellitus without complication ([place])   . Dysrhythmia   . General weakness 12/2015  . GERD (gastroesophageal reflux disease)   . Glaucoma   . History of hiatal hernia   . History of kidney stones    passed  . History of pneumonia   . HOH (hard of hearing)   . Hypertension   . Hypokalemia 12/2015  . Myocardial infarction ([place])   . Neuromuscular disorder ([place])    CARPEL TUNNEL  . Pneumonia   . Shortness of breath   . Stroke ([place])    hx of TIA    Family History  Problem Relation Age of Onset  . Diabetes Mother   . Hypertension Mother   . Heart disease Mother        beofre age 42  . Heart attack Mother   . Stroke Mother   . Cancer Father   . Hyperlipidemia Father   . Hypertension Father   . Deep vein thrombosis Daughter   . Diabetes Daughter   . Hyperlipidemia Daughter   . Hypertension Daughter   . Heart disease Daughter   . Peripheral vascular disease Daughter   . Breast cancer Neg Hx    Past Surgical History:  Procedure Laterality Date  . A/V FISTULAGRAM N/A 07/21/2017   Procedure: A/V FISTULAGRAM - Left Arm;  Surgeon: Angelia Mould, MD;  Location: [place] [facility];  Service: Cardiovascular;  Laterality: N/A;  . AV FISTULA PLACEMENT Left 05/31/2014   Procedure: Creation of Left Arm arteriovenous brachiocephalic Fistula;  Surgeon: Angelia Mould, MD;  Location: [place];  Service: Vascular;  Laterality: Left;  . AV FISTULA PLACEMENT Left 09/01/2014   Procedure: INSERTION OF ARTERIOVENOUS (AV) GORE-TEX GRAFT LEFT UPPER ARM USING  4-7 MM X 45 CM SRTETCH GORETEX GRAFT;  Surgeon: Angelia Mould, MD;  Location: [place];  Service: Vascular;  Laterality: Left;  . BACK SURGERY    . CARDIAC CATHETERIZATION    . CAROTID ENDARTERECTOMY Left 2009    CEA  . carpel tunnel    . COLONOSCOPY  07/25/2011   Procedure: COLONOSCOPY;  Surgeon: Winfield Cunas., MD;  Location: [facility] ENDOSCOPY;  Service: Endoscopy;  Laterality: N/A;  . CORONARY ARTERY BYPASS GRAFT  07/29/2011   Procedure: CORONARY ARTERY BYPASS GRAFTING (CABG);  Surgeon: Gaye Pollack, MD;  Location: [place];  Service: Open Heart Surgery;  Laterality: N/A;  . CORONARY STENT INTERVENTION N/A 04/08/2018   Procedure: CORONARY STENT INTERVENTION;  Surgeon: Jettie Booze, MD;  Location: [place] [facility];  Service: Cardiovascular;  Laterality: N/A;  . ENDARTERECTOMY Right 04/21/2015   Procedure: ENDARTERECTOMY CAROTID;  Surgeon: Angelia Mould, MD;   Location: [place];  Service: Vascular;  Laterality: Right;  . ESOPHAGOGASTRODUODENOSCOPY  07/25/2011  Procedure: ESOPHAGOGASTRODUODENOSCOPY (EGD);  Surgeon: Winfield Cunas., MD;  Location: [facility] ENDOSCOPY;  Service: Endoscopy;  Laterality: N/A;  . EYE SURGERY    . FISTULOGRAM Left 08/29/2014   Procedure: FISTULOGRAM;  Surgeon: Angelia Mould, MD;  Location: [facility] CATH LAB;  Service: Cardiovascular;  Laterality: Left;  . KNEE SURGERY Left   . LEFT HEART CATH AND CORS/GRAFTS ANGIOGRAPHY N/A 04/08/2018   Procedure: LEFT HEART CATH AND CORS/GRAFTS ANGIOGRAPHY;  Surgeon: Jettie Booze, MD;  Location: [place] CV [facility];  Service: Cardiovascular;  Laterality: N/A;  . LUMBAR LAMINECTOMY/DECOMPRESSION MICRODISCECTOMY N/A 12/01/2015   Procedure: L5-S1 Decompression and Bilateral Microdiscectomy;  Surgeon: Marybelle Killings, MD;  Location: [place];  Service: Orthopedics;  Laterality: N/A;  . NECK SURGERY    . PERIPHERAL VASCULAR BALLOON ANGIOPLASTY  07/21/2017   Procedure: PERIPHERAL VASCULAR BALLOON ANGIOPLASTY;  Surgeon: Angelia Mould, MD;  Location: [place] CV LAB;  Service: Cardiovascular;;  LT Arm AVF  . TONSILLECTOMY     Social History   Socioeconomic History  . Marital status: Married    Spouse name: Not on file  . Number of children: Not on file  . Years of education: Not on file  . Highest education level: Not on file  Occupational History  . Not on file  Social Needs  . Financial resource strain: Not on file  . Food insecurity:    Worry: Not on file    Inability: Not on file  . Transportation needs:    Medical: Not on file    Non-medical: Not on file  Tobacco Use  . Smoking status: Never Smoker  . Smokeless tobacco: Never Used  Substance and Sexual Activity  . Alcohol use: No    Alcohol/week: 0.0 standard drinks  . Drug use: No  . Sexual activity: Not Currently    Birth control/protection: Post-menopausal  Lifestyle  . Physical activity:    Days per week: Not  on file    Minutes per session: Not on file  . Stress: Not on file  Relationships  . Social connections:    Talks on phone: Not on file    Gets together: Not on file    Attends religious service: Not on file    Active member of club or organization: Not on file    Attends meetings of clubs or organizations: Not on file    Relationship status: Not on file  . Intimate partner violence:    Fear of current or ex partner: Not on file    Emotionally abused: Not on file    Physically abused: Not on file    Forced sexual activity: Not on file  Other Topics Concern  . Not on file  Social History Narrative  . Not on file     Review of Systems: General: negative for chills, fever, night sweats or weight changes.  Cardiovascular: negative for chest pain, dyspnea on exertion, edema, orthopnea, palpitations, paroxysmal nocturnal dyspnea or shortness of breath Dermatological: negative for rash Respiratory: negative for cough or wheezing Urologic: negative for hematuria Abdominal: negative for nausea, vomiting, diarrhea, bright red blood per rectum, melena, or hematemesis Neurologic: negative for visual changes, syncope, or dizziness All other systems reviewed and are otherwise negative except as noted above.   Physical Exam:  Height 5\' 5"  (1.651 m), weight 169 lb 1.9 oz (76.7 kg).  General appearance: alert, cooperative and no distress Neck: no carotid bruit and no JVD Lungs: clear to auscultation bilaterally Heart: regular rate and  rhythm, S1, S2 normal, no murmur, click, rub or gallop Extremities: extremities normal, atraumatic, no cyanosis or edema Pulses: 2+ and symmetric Skin: Skin color, texture, turgor normal. No rashes or lesions Neurologic: Grossly normal  EKG NSR with bifascicular  block -- personally reviewed   ASSESSMENT AND PLAN:   1. Cardiac Arrest: 2/2 NSTEMI. Now w/p PCI. Seen by EP and no indication for PCI as she had no further ventricular arrhthymias post  revascularization.   2. CAD: h/o CABG and recent NSTEMi 2/2 occluded SVG-RCA treated w/ PCI + DES. Doing well w/o any recurrent angina. Continue DAPT w/ ASA and Brilinta x 12 months + statin therapy w/ Lipitor (LDL controlled at 44 mgd/L). No BB due to bradycardia.   3. ESRD: on HD Tue, Thur, Sat.   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 Volume controlled at HD.   5. HTN: controlled on hydrazine but she often has hypotension during HD and has to stop sessions prematurely. We will reduce hydralazine down to 25 mg BID, morning and night. Hold AM dose on HD days (starts HD at 1pm). Monitor BP closely at home and at HD. Call our office if SBPs >140.   6. DM: management per PCP.   7. Bradycardia: BB discontinued during hospitalization. EKG with HR in the 80s. Continue to avoid AV nodal blocking agents.    Follow-Up w/ Dr. Irish Lack in 2 months  Kyiah Canepa Ladoris Gene, MHS [facility] 05/06/2018 9:21 AM

## 2018-05-06 NOTE — Telephone Encounter (Signed)
Spoke with patients husband and passed on Dr. Charm Barges advise. Advised husband to have [facility] call us to secure lab orders

## 2018-05-06 NOTE — Patient Instructions (Addendum)
Medication Instructions:  Your physician has recommended you make the following change in your medication:  1.  DECREASE the Hydralazine to 50 mg twice a day.  ON DIALYSIS DAYS, HOLD THE MORNING DOSE OF THIS. If you need a refill on your cardiac medications before your next appointment, please call your pharmacy.   Lab work: None ordered  If you have labs (blood work) drawn today and your tests are completely normal, you will receive your results only by: Marland Kitchen MyChart Message (if you have MyChart) OR . A paper copy in the mail If you have any lab test that is abnormal or we need to change your treatment, we will call you to review the results.  Testing/Procedures: None ordered  Follow-Up: Your physician recommends that you schedule a follow-up appointment in: 07/30/18 WITH DR. VARANASI AT 9:20  Any Other Special Instructions Will Be Listed Below (If Applicable). Monitor your blood pressure at home and at Dialysis.  If you notice your blood pressure is running higher than 140 on the top consistently, please call the office.

## 2018-05-07 DIAGNOSIS — D649 Anemia, unspecified: Secondary | ICD-10-CM | POA: Diagnosis not present

## 2018-05-07 DIAGNOSIS — N186 End stage renal disease: Secondary | ICD-10-CM | POA: Diagnosis not present

## 2018-05-08 ENCOUNTER — Telehealth: Payer: Self-pay | Admitting: *Deleted

## 2018-05-08 DIAGNOSIS — I1311 Hypertensive heart and chronic kidney disease without heart failure, with stage 5 chronic kidney disease, or end stage renal disease: Secondary | ICD-10-CM | POA: Diagnosis not present

## 2018-05-08 DIAGNOSIS — E1142 Type 2 diabetes mellitus with diabetic polyneuropathy: Secondary | ICD-10-CM | POA: Diagnosis not present

## 2018-05-08 DIAGNOSIS — G931 Anoxic brain damage, not elsewhere classified: Secondary | ICD-10-CM | POA: Diagnosis not present

## 2018-05-08 DIAGNOSIS — I2510 Atherosclerotic heart disease of native coronary artery without angina pectoris: Secondary | ICD-10-CM | POA: Diagnosis not present

## 2018-05-08 DIAGNOSIS — E1122 Type 2 diabetes mellitus with diabetic chronic kidney disease: Secondary | ICD-10-CM | POA: Diagnosis not present

## 2018-05-08 DIAGNOSIS — Z8674 Personal history of sudden cardiac arrest: Secondary | ICD-10-CM | POA: Diagnosis not present

## 2018-05-08 NOTE — Telephone Encounter (Signed)
Magda Paganini PT [facility] called for VO for POC for 2 wk 4.  Approval given.

## 2018-05-09 DIAGNOSIS — D649 Anemia, unspecified: Secondary | ICD-10-CM | POA: Diagnosis not present

## 2018-05-09 DIAGNOSIS — N186 End stage renal disease: Secondary | ICD-10-CM | POA: Diagnosis not present

## 2018-05-11 ENCOUNTER — Encounter: Payer: Medicare Other | Attending: Physical Medicine & Rehabilitation | Admitting: Physical Medicine & Rehabilitation

## 2018-05-11 ENCOUNTER — Encounter: Payer: Self-pay | Admitting: Physical Medicine & Rehabilitation

## 2018-05-11 VITALS — BP 134/71 | HR 85 | Resp 14 | Ht 65.0 in | Wt 169.0 lb

## 2018-05-11 DIAGNOSIS — I10 Essential (primary) hypertension: Secondary | ICD-10-CM

## 2018-05-11 DIAGNOSIS — M1990 Unspecified osteoarthritis, unspecified site: Secondary | ICD-10-CM | POA: Insufficient documentation

## 2018-05-11 DIAGNOSIS — H9190 Unspecified hearing loss, unspecified ear: Secondary | ICD-10-CM

## 2018-05-11 DIAGNOSIS — Z8249 Family history of ischemic heart disease and other diseases of the circulatory system: Secondary | ICD-10-CM | POA: Insufficient documentation

## 2018-05-11 DIAGNOSIS — E785 Hyperlipidemia, unspecified: Secondary | ICD-10-CM | POA: Insufficient documentation

## 2018-05-11 DIAGNOSIS — Z951 Presence of aortocoronary bypass graft: Secondary | ICD-10-CM | POA: Diagnosis not present

## 2018-05-11 DIAGNOSIS — A0472 Enterocolitis due to Clostridium difficile, not specified as recurrent: Secondary | ICD-10-CM

## 2018-05-11 DIAGNOSIS — Z8674 Personal history of sudden cardiac arrest: Secondary | ICD-10-CM | POA: Diagnosis not present

## 2018-05-11 DIAGNOSIS — R5381 Other malaise: Secondary | ICD-10-CM | POA: Insufficient documentation

## 2018-05-11 DIAGNOSIS — Z955 Presence of coronary angioplasty implant and graft: Secondary | ICD-10-CM | POA: Diagnosis not present

## 2018-05-11 DIAGNOSIS — I4891 Unspecified atrial fibrillation: Secondary | ICD-10-CM | POA: Diagnosis not present

## 2018-05-11 DIAGNOSIS — H409 Unspecified glaucoma: Secondary | ICD-10-CM | POA: Diagnosis not present

## 2018-05-11 DIAGNOSIS — I252 Old myocardial infarction: Secondary | ICD-10-CM | POA: Insufficient documentation

## 2018-05-11 DIAGNOSIS — K648 Other hemorrhoids: Secondary | ICD-10-CM

## 2018-05-11 DIAGNOSIS — M5416 Radiculopathy, lumbar region: Secondary | ICD-10-CM | POA: Diagnosis not present

## 2018-05-11 DIAGNOSIS — I2510 Atherosclerotic heart disease of native coronary artery without angina pectoris: Secondary | ICD-10-CM | POA: Diagnosis not present

## 2018-05-11 DIAGNOSIS — F419 Anxiety disorder, unspecified: Secondary | ICD-10-CM | POA: Diagnosis not present

## 2018-05-11 DIAGNOSIS — Z87442 Personal history of urinary calculi: Secondary | ICD-10-CM | POA: Insufficient documentation

## 2018-05-11 DIAGNOSIS — Z79899 Other long term (current) drug therapy: Secondary | ICD-10-CM

## 2018-05-11 DIAGNOSIS — N186 End stage renal disease: Secondary | ICD-10-CM | POA: Diagnosis not present

## 2018-05-11 DIAGNOSIS — N189 Chronic kidney disease, unspecified: Secondary | ICD-10-CM | POA: Diagnosis not present

## 2018-05-11 DIAGNOSIS — Z8673 Personal history of transient ischemic attack (TIA), and cerebral infarction without residual deficits: Secondary | ICD-10-CM | POA: Diagnosis not present

## 2018-05-11 DIAGNOSIS — Z7902 Long term (current) use of antithrombotics/antiplatelets: Secondary | ICD-10-CM

## 2018-05-11 DIAGNOSIS — E1122 Type 2 diabetes mellitus with diabetic chronic kidney disease: Secondary | ICD-10-CM | POA: Insufficient documentation

## 2018-05-11 DIAGNOSIS — F329 Major depressive disorder, single episode, unspecified: Secondary | ICD-10-CM | POA: Diagnosis not present

## 2018-05-11 DIAGNOSIS — K219 Gastro-esophageal reflux disease without esophagitis: Secondary | ICD-10-CM | POA: Diagnosis not present

## 2018-05-11 DIAGNOSIS — Z794 Long term (current) use of insulin: Secondary | ICD-10-CM

## 2018-05-11 DIAGNOSIS — D649 Anemia, unspecified: Secondary | ICD-10-CM | POA: Diagnosis not present

## 2018-05-11 DIAGNOSIS — I472 Ventricular tachycardia: Secondary | ICD-10-CM | POA: Diagnosis not present

## 2018-05-11 DIAGNOSIS — Z7982 Long term (current) use of aspirin: Secondary | ICD-10-CM | POA: Diagnosis not present

## 2018-05-11 DIAGNOSIS — G931 Anoxic brain damage, not elsewhere classified: Secondary | ICD-10-CM | POA: Diagnosis not present

## 2018-05-11 DIAGNOSIS — Z85820 Personal history of malignant melanoma of skin: Secondary | ICD-10-CM

## 2018-05-11 DIAGNOSIS — Z992 Dependence on renal dialysis: Secondary | ICD-10-CM

## 2018-05-11 DIAGNOSIS — K649 Unspecified hemorrhoids: Secondary | ICD-10-CM | POA: Diagnosis not present

## 2018-05-11 DIAGNOSIS — Z8701 Personal history of pneumonia (recurrent): Secondary | ICD-10-CM

## 2018-05-11 DIAGNOSIS — I129 Hypertensive chronic kidney disease with stage 1 through stage 4 chronic kidney disease, or unspecified chronic kidney disease: Secondary | ICD-10-CM | POA: Diagnosis not present

## 2018-05-11 DIAGNOSIS — I1311 Hypertensive heart and chronic kidney disease without heart failure, with stage 5 chronic kidney disease, or end stage renal disease: Secondary | ICD-10-CM | POA: Diagnosis not present

## 2018-05-11 DIAGNOSIS — E1142 Type 2 diabetes mellitus with diabetic polyneuropathy: Secondary | ICD-10-CM | POA: Diagnosis not present

## 2018-05-11 NOTE — Progress Notes (Signed)
Subjective:    Patient ID: [name], female    DOB: 09-25-1940, 77 y.o.   MRN: [id number]  HPI   This is a transitional care visit for [name].  She was admitted to rehab and discharged on 04/28/2018.  She tells me that a nurse and physical therapy is come out to the house one time since being there.  Daughter called to report further loose/soft stools.  Her skin is improved but the stools have continued despite taking probiotic and fiber.  She is rarely using colchicine.  She still remains on allopurinol 200 mg daily for treatment of her gout.  Patient has not been doing much in the way of activity since being home.  Family states that she is resistant lot times to getting up.  She is finding it increasingly difficult to perform basic tasks such as dressing and showering.  She is in her wheelchair most of the time.  She had a good checkup with her cardiologist.  Sugars have been under better control.  She is not eating as she should.  Sleep is fair.   Pain Inventory Average Pain 4 Pain Right Now 4 My pain is aching  In the last 24 hours, has pain interfered with the following? General activity 8 Relation with others 5 Enjoyment of life 10 What TIME of day is your pain at its worst? all Sleep (in general) Good  Pain is worse with: walking, bending and sitting Pain improves with: medication Relief from Meds: 5  Mobility walk with assistance use a walker ability to climb steps?  no do you drive?  no use a wheelchair needs help with transfers  Function retired I need assistance with the following:  dressing, toileting, meal prep, household duties and shopping  Neuro/Psych bowel control problems weakness trouble walking confusion anxiety  Prior Studies transitional care  Physicians involved in your care transitional care   Family History  Problem Relation Age of Onset  . Diabetes Mother   . Hypertension Mother   . Heart disease Mother    beofre age 56  . Heart attack Mother   . Stroke Mother   . Cancer Father   . Hyperlipidemia Father   . Hypertension Father   . Deep vein thrombosis Daughter   . Diabetes Daughter   . Hyperlipidemia Daughter   . Hypertension Daughter   . Heart disease Daughter   . Peripheral vascular disease Daughter   . Breast cancer Neg Hx    Social History   Socioeconomic History  . Marital status: Married    Spouse name: Not on file  . Number of children: Not on file  . Years of education: Not on file  . Highest education level: Not on file  Occupational History  . Not on file  Social Needs  . Financial resource strain: Not on file  . Food insecurity:    Worry: Not on file    Inability: Not on file  . Transportation needs:    Medical: Not on file    Non-medical: Not on file  Tobacco Use  . Smoking status: Never Smoker  . Smokeless tobacco: Never Used  Substance and Sexual Activity  . Alcohol use: No    Alcohol/week: 0.0 standard drinks  . Drug use: No  . Sexual activity: Not Currently    Birth control/protection: Post-menopausal  Lifestyle  . Physical activity:    Days per week: Not on file    Minutes per session: Not on file  .  Stress: Not on file  Relationships  . Social connections:    Talks on phone: Not on file    Gets together: Not on file    Attends religious service: Not on file    Active member of club or organization: Not on file    Attends meetings of clubs or organizations: Not on file    Relationship status: Not on file  Other Topics Concern  . Not on file  Social History Narrative  . Not on file   Past Surgical History:  Procedure Laterality Date  . A/V FISTULAGRAM N/A 07/21/2017   Procedure: A/V FISTULAGRAM - Left Arm;  Surgeon: Angelia Mould, MD;  Location: [place] [facility];  Service: Cardiovascular;  Laterality: N/A;  . AV FISTULA PLACEMENT Left 05/31/2014   Procedure: Creation of Left Arm arteriovenous brachiocephalic Fistula;  Surgeon:  Angelia Mould, MD;  Location: [place];  Service: Vascular;  Laterality: Left;  . AV FISTULA PLACEMENT Left 09/01/2014   Procedure: INSERTION OF ARTERIOVENOUS (AV) GORE-TEX GRAFT LEFT UPPER ARM USING  4-7 MM X 45 CM SRTETCH GORETEX GRAFT;  Surgeon: Angelia Mould, MD;  Location: [place];  Service: Vascular;  Laterality: Left;  . BACK SURGERY    . CARDIAC CATHETERIZATION    . CAROTID ENDARTERECTOMY Left 2009    CEA  . carpel tunnel    . COLONOSCOPY  07/25/2011   Procedure: COLONOSCOPY;  Surgeon: Winfield Cunas., MD;  Location[facility];  Service: Endoscopy;  Laterality: N/A;  . CORONARY ARTERY BYPASS GRAFT  07/29/2011   Procedure: CORONARY ARTERY BYPASS GRAFTING (CABG);  Surgeon: Gaye Pollack, MD;  Location: [place];  Service: Open Heart Surgery;  Laterality: N/A;  . CORONARY STENT INTERVENTION N/A 04/08/2018   Procedure: CORONARY STENT INTERVENTION;  Surgeon: Jettie Booze, MD;  Location: [place] [facility];  Service: Cardiovascular;  Laterality: N/A;  . ENDARTERECTOMY Right 04/21/2015   Procedure: ENDARTERECTOMY CAROTID;  Surgeon: Angelia Mould, MD;  Location: [place];  Service: Vascular;  Laterality: Right;  . ESOPHAGOGASTRODUODENOSCOPY  07/25/2011   Procedure: ESOPHAGOGASTRODUODENOSCOPY (EGD);  Surgeon: Winfield Cunas., MD;  Location: [facility] END[facility]COPY;  Service: Endoscopy;  Laterality: N/A;  . EYE SURGERY    . FISTULOGRAM Left 08/29/2014   Procedure: FISTULOGRAM;  Surgeon: Angelia Mould, MD;  Location[facility] C[facility] LAB;  Service: Cardiovascular;  Laterality: Left;  . KNEE SURGERY Left   . LEFT HEART CATH AND CORS/GRAFTS ANGIOGRAPHY N/A 04/08/2018   Procedure: LEFT HEART CATH AND CORS/GRAFTS ANGIOGRAPHY;  Surgeon: Jettie Booze, MD;  Location: [place] [facility];  Service: Cardiovascular;  Laterality: N/A;  . LUMBAR LAMINECTOMY/DECOMPRESSION MICRODISCECTOMY N/A 12/01/2015   Procedure: L5-S1 Decompression and Bilateral Microdiscectomy;  Surgeon: Marybelle Killings,  MD;  Location: [place];  Service: Orthopedics;  Laterality: N/A;  . NECK SURGERY    . PERIPHERAL VASCULAR BALLOON ANGIOPLASTY  07/21/2017   Procedure: PERIPHERAL VASCULAR BALLOON ANGIOPLASTY;  Surgeon: Angelia Mould, MD;  Location: [place] [facility];  Service: Cardiovascular;;  LT Arm AVF  . TONSILLECTOMY     Past Medical History:  Diagnosis Date  . Anemia   . Arthritis   . Atrial fibrillation ([place])   . Blood transfusion   . CAD (coronary artery disease)   . Cancer ([place])    .  top of head- melonoma  . Carotid artery occlusion    Carotid Endartectom,y - left 2009.  Blockage Right being watched by Dr Scot Dock.  Marland Kitchen  Carotid stenosis   . 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 Complication of anesthesia    pt. states that she was difficult to wake  . Depression   . Diabetes mellitus without complication ([place])   . Dysrhythmia   . General weakness 12/2015  . GERD (gastroesophageal reflux disease)   . Glaucoma   . History of hiatal hernia   . History of kidney stones    passed  . History of pneumonia   . HOH (hard of hearing)   . Hypertension   . Hypokalemia 12/2015  . Myocardial infarction ([place])   . Neuromuscular disorder ([place])    CARPEL TUNNEL  . Pneumonia   . Shortness of breath   . Stroke ([place])    hx of TIA   BP 134/71 (BP Location: Right Arm, Patient Position: Sitting, Cuff Size: Normal)   Pulse 85   Resp 14   Ht 5\' 5"  (1.651 m)   Wt 169 lb (76.7 kg)   SpO2 94%   BMI 28.12 kg/m   Opioid Risk Score:   Fall Risk Score:  `1  Depression screen PHQ 2/9  Depression screen [facility] 2/9 01/04/2016 01/03/2016  Decreased Interest 1 2  Down, Depressed, Hopeless 1 1  PHQ - 2 Score 2 3  Altered sleeping 2 3  Tired, decreased energy 2 1  Change in appetite 0 0  Feeling bad or failure about yourself  1 2  Trouble concentrating 1 2  Moving slowly or fidgety/restless 1 2  Suicidal thoughts 0 0  PHQ-9 Score 9 13  Difficult doing work/chores Somewhat difficult -   Some recent data might be hidden    Review of Systems  Constitutional: Positive for appetite change and unexpected weight change.  HENT: Positive for hearing loss.   Eyes: Negative.   Respiratory: Positive for shortness of breath.   Gastrointestinal: Positive for diarrhea.  Endocrine: Negative.   Musculoskeletal: Positive for gait problem.  Allergic/Immunologic: Negative.   Neurological: Positive for weakness.  Psychiatric/Behavioral: Positive for confusion. The patient is nervous/anxious.        Objective:   Physical Exam  General: No acute distress HEENT: EOMI, oral membranes moist Cards: reg rate  Chest: normal effort Abdomen: Soft, NT, ND Skin: dry, intact Extremities: no edema  Musculoskeletal: Trace lower extremity edema Neurological: She is alert.  A&O x 3.  Motor: 4/5 grossly throughout upper extremities, she is 3+ to 4- out of 5 hip flexion 4 out of 5 knee extension 4 to 4+ out of 5 ankle dorsiflexion and plantarflexion bilaterally.  She is unable to stand from a seated position without physical assistance today.  She was out of breath after performing a basic manual muscle test and attempting transferring. Skin:  Visible skin intact.  I do not examine her anal rectal area Psychiatric: Generally pleasant and in good spirits     Medical Problem List and Plan: 1. 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             -NEEDS to resume Home health therapy.   -needs to push herself a little bit as well.  Discussed some simple exercises she can work on at home in front of family  -Discussed the importance of proper dietary intake as well 2. Mood: Needs regular positive reinforcement.   3. . Hypertension. Hydralazine 25 mg every 8 hours/other medications per  cardiology                4. Nonsustained V. Tach/bradycardia.  Follow-up per cardiology services.   5.  C. difficile.   continued loose/soft stool  -on probiotic, fiber  -decrease allopurinol, stop all together  -GI referral made to leave our gastroenterology  -Wrote an order for home health nurse to collect stool specimen for C. difficile 6. End-stage renal disease with hemodialysis.  7. DM per primary              -improved          8. History of CAD with CABG 2013. Continue aspirin 9. Hyperlipidemia. Lipitor 10. Acute on chronic anemia. Continue Aranesp                 Follow up with me in about 2 months. Thirty minutes of face to face patient care time were spent during this visit. All questions were encouraged and answered.

## 2018-05-11 NOTE — Patient Instructions (Signed)
DECREASE ALLOPURINOL TO 100MG  DAILY AND WATCH FOR STOOL CONSISTENCY.    CHECK STOOL FOR C DIFF AGAIN.

## 2018-05-12 ENCOUNTER — Encounter: Payer: Self-pay | Admitting: Gastroenterology

## 2018-05-12 DIAGNOSIS — N186 End stage renal disease: Secondary | ICD-10-CM | POA: Diagnosis not present

## 2018-05-12 DIAGNOSIS — D649 Anemia, unspecified: Secondary | ICD-10-CM | POA: Diagnosis not present

## 2018-05-13 DIAGNOSIS — Z8674 Personal history of sudden cardiac arrest: Secondary | ICD-10-CM | POA: Diagnosis not present

## 2018-05-13 DIAGNOSIS — I1311 Hypertensive heart and chronic kidney disease without heart failure, with stage 5 chronic kidney disease, or end stage renal disease: Secondary | ICD-10-CM | POA: Diagnosis not present

## 2018-05-13 DIAGNOSIS — E1122 Type 2 diabetes mellitus with diabetic chronic kidney disease: Secondary | ICD-10-CM | POA: Diagnosis not present

## 2018-05-13 DIAGNOSIS — E1142 Type 2 diabetes mellitus with diabetic polyneuropathy: Secondary | ICD-10-CM | POA: Diagnosis not present

## 2018-05-13 DIAGNOSIS — I2510 Atherosclerotic heart disease of native coronary artery without angina pectoris: Secondary | ICD-10-CM | POA: Diagnosis not present

## 2018-05-13 DIAGNOSIS — G931 Anoxic brain damage, not elsewhere classified: Secondary | ICD-10-CM | POA: Diagnosis not present

## 2018-05-14 DIAGNOSIS — D649 Anemia, unspecified: Secondary | ICD-10-CM | POA: Diagnosis not present

## 2018-05-14 DIAGNOSIS — N186 End stage renal disease: Secondary | ICD-10-CM | POA: Diagnosis not present

## 2018-05-15 ENCOUNTER — Other Ambulatory Visit: Payer: Self-pay | Admitting: Internal Medicine

## 2018-05-15 DIAGNOSIS — D649 Anemia, unspecified: Secondary | ICD-10-CM | POA: Diagnosis not present

## 2018-05-15 DIAGNOSIS — Z8674 Personal history of sudden cardiac arrest: Secondary | ICD-10-CM | POA: Diagnosis not present

## 2018-05-15 DIAGNOSIS — Z1231 Encounter for screening mammogram for malignant neoplasm of breast: Secondary | ICD-10-CM

## 2018-05-15 DIAGNOSIS — G931 Anoxic brain damage, not elsewhere classified: Secondary | ICD-10-CM | POA: Diagnosis not present

## 2018-05-15 DIAGNOSIS — E1142 Type 2 diabetes mellitus with diabetic polyneuropathy: Secondary | ICD-10-CM | POA: Diagnosis not present

## 2018-05-15 DIAGNOSIS — D509 Iron deficiency anemia, unspecified: Secondary | ICD-10-CM | POA: Diagnosis not present

## 2018-05-15 DIAGNOSIS — I1311 Hypertensive heart and chronic kidney disease without heart failure, with stage 5 chronic kidney disease, or end stage renal disease: Secondary | ICD-10-CM | POA: Diagnosis not present

## 2018-05-15 DIAGNOSIS — N186 End stage renal disease: Secondary | ICD-10-CM | POA: Diagnosis not present

## 2018-05-15 DIAGNOSIS — Z992 Dependence on renal dialysis: Secondary | ICD-10-CM | POA: Diagnosis not present

## 2018-05-15 DIAGNOSIS — Z23 Encounter for immunization: Secondary | ICD-10-CM | POA: Diagnosis not present

## 2018-05-15 DIAGNOSIS — I2510 Atherosclerotic heart disease of native coronary artery without angina pectoris: Secondary | ICD-10-CM | POA: Diagnosis not present

## 2018-05-15 DIAGNOSIS — E1129 Type 2 diabetes mellitus with other diabetic kidney complication: Secondary | ICD-10-CM | POA: Diagnosis not present

## 2018-05-15 DIAGNOSIS — E1122 Type 2 diabetes mellitus with diabetic chronic kidney disease: Secondary | ICD-10-CM | POA: Diagnosis not present

## 2018-05-15 DIAGNOSIS — E876 Hypokalemia: Secondary | ICD-10-CM | POA: Diagnosis not present

## 2018-05-15 DIAGNOSIS — E0869 Diabetes mellitus due to underlying condition with other specified complication: Secondary | ICD-10-CM | POA: Diagnosis not present

## 2018-05-16 DIAGNOSIS — D509 Iron deficiency anemia, unspecified: Secondary | ICD-10-CM | POA: Diagnosis not present

## 2018-05-16 DIAGNOSIS — E876 Hypokalemia: Secondary | ICD-10-CM | POA: Diagnosis not present

## 2018-05-16 DIAGNOSIS — N186 End stage renal disease: Secondary | ICD-10-CM | POA: Diagnosis not present

## 2018-05-16 DIAGNOSIS — E0869 Diabetes mellitus due to underlying condition with other specified complication: Secondary | ICD-10-CM | POA: Diagnosis not present

## 2018-05-16 DIAGNOSIS — D649 Anemia, unspecified: Secondary | ICD-10-CM | POA: Diagnosis not present

## 2018-05-16 DIAGNOSIS — Z23 Encounter for immunization: Secondary | ICD-10-CM | POA: Diagnosis not present

## 2018-05-19 DIAGNOSIS — Z23 Encounter for immunization: Secondary | ICD-10-CM | POA: Diagnosis not present

## 2018-05-19 DIAGNOSIS — N186 End stage renal disease: Secondary | ICD-10-CM | POA: Diagnosis not present

## 2018-05-19 DIAGNOSIS — D509 Iron deficiency anemia, unspecified: Secondary | ICD-10-CM | POA: Diagnosis not present

## 2018-05-19 DIAGNOSIS — E876 Hypokalemia: Secondary | ICD-10-CM | POA: Diagnosis not present

## 2018-05-19 DIAGNOSIS — D649 Anemia, unspecified: Secondary | ICD-10-CM | POA: Diagnosis not present

## 2018-05-19 DIAGNOSIS — E0869 Diabetes mellitus due to underlying condition with other specified complication: Secondary | ICD-10-CM | POA: Diagnosis not present

## 2018-05-20 DIAGNOSIS — E1122 Type 2 diabetes mellitus with diabetic chronic kidney disease: Secondary | ICD-10-CM | POA: Diagnosis not present

## 2018-05-20 DIAGNOSIS — I1311 Hypertensive heart and chronic kidney disease without heart failure, with stage 5 chronic kidney disease, or end stage renal disease: Secondary | ICD-10-CM | POA: Diagnosis not present

## 2018-05-20 DIAGNOSIS — Z8674 Personal history of sudden cardiac arrest: Secondary | ICD-10-CM | POA: Diagnosis not present

## 2018-05-20 DIAGNOSIS — E1142 Type 2 diabetes mellitus with diabetic polyneuropathy: Secondary | ICD-10-CM | POA: Diagnosis not present

## 2018-05-20 DIAGNOSIS — G931 Anoxic brain damage, not elsewhere classified: Secondary | ICD-10-CM | POA: Diagnosis not present

## 2018-05-20 DIAGNOSIS — I2510 Atherosclerotic heart disease of native coronary artery without angina pectoris: Secondary | ICD-10-CM | POA: Diagnosis not present

## 2018-05-21 DIAGNOSIS — E0869 Diabetes mellitus due to underlying condition with other specified complication: Secondary | ICD-10-CM | POA: Diagnosis not present

## 2018-05-21 DIAGNOSIS — Z23 Encounter for immunization: Secondary | ICD-10-CM | POA: Diagnosis not present

## 2018-05-21 DIAGNOSIS — D509 Iron deficiency anemia, unspecified: Secondary | ICD-10-CM | POA: Diagnosis not present

## 2018-05-21 DIAGNOSIS — N186 End stage renal disease: Secondary | ICD-10-CM | POA: Diagnosis not present

## 2018-05-21 DIAGNOSIS — D649 Anemia, unspecified: Secondary | ICD-10-CM | POA: Diagnosis not present

## 2018-05-21 DIAGNOSIS — E876 Hypokalemia: Secondary | ICD-10-CM | POA: Diagnosis not present

## 2018-05-22 DIAGNOSIS — G931 Anoxic brain damage, not elsewhere classified: Secondary | ICD-10-CM | POA: Diagnosis not present

## 2018-05-22 DIAGNOSIS — E1122 Type 2 diabetes mellitus with diabetic chronic kidney disease: Secondary | ICD-10-CM | POA: Diagnosis not present

## 2018-05-22 DIAGNOSIS — E1142 Type 2 diabetes mellitus with diabetic polyneuropathy: Secondary | ICD-10-CM | POA: Diagnosis not present

## 2018-05-22 DIAGNOSIS — I2510 Atherosclerotic heart disease of native coronary artery without angina pectoris: Secondary | ICD-10-CM | POA: Diagnosis not present

## 2018-05-22 DIAGNOSIS — I1311 Hypertensive heart and chronic kidney disease without heart failure, with stage 5 chronic kidney disease, or end stage renal disease: Secondary | ICD-10-CM | POA: Diagnosis not present

## 2018-05-22 DIAGNOSIS — Z8674 Personal history of sudden cardiac arrest: Secondary | ICD-10-CM | POA: Diagnosis not present

## 2018-05-23 DIAGNOSIS — D649 Anemia, unspecified: Secondary | ICD-10-CM | POA: Diagnosis not present

## 2018-05-23 DIAGNOSIS — E0869 Diabetes mellitus due to underlying condition with other specified complication: Secondary | ICD-10-CM | POA: Diagnosis not present

## 2018-05-23 DIAGNOSIS — D509 Iron deficiency anemia, unspecified: Secondary | ICD-10-CM | POA: Diagnosis not present

## 2018-05-23 DIAGNOSIS — E876 Hypokalemia: Secondary | ICD-10-CM | POA: Diagnosis not present

## 2018-05-23 DIAGNOSIS — N186 End stage renal disease: Secondary | ICD-10-CM | POA: Diagnosis not present

## 2018-05-23 DIAGNOSIS — Z23 Encounter for immunization: Secondary | ICD-10-CM | POA: Diagnosis not present

## 2018-05-26 DIAGNOSIS — Z23 Encounter for immunization: Secondary | ICD-10-CM | POA: Diagnosis not present

## 2018-05-26 DIAGNOSIS — D509 Iron deficiency anemia, unspecified: Secondary | ICD-10-CM | POA: Diagnosis not present

## 2018-05-26 DIAGNOSIS — E0869 Diabetes mellitus due to underlying condition with other specified complication: Secondary | ICD-10-CM | POA: Diagnosis not present

## 2018-05-26 DIAGNOSIS — D649 Anemia, unspecified: Secondary | ICD-10-CM | POA: Diagnosis not present

## 2018-05-26 DIAGNOSIS — E876 Hypokalemia: Secondary | ICD-10-CM | POA: Diagnosis not present

## 2018-05-26 DIAGNOSIS — N186 End stage renal disease: Secondary | ICD-10-CM | POA: Diagnosis not present

## 2018-05-27 DIAGNOSIS — E1142 Type 2 diabetes mellitus with diabetic polyneuropathy: Secondary | ICD-10-CM | POA: Diagnosis not present

## 2018-05-27 DIAGNOSIS — E1122 Type 2 diabetes mellitus with diabetic chronic kidney disease: Secondary | ICD-10-CM | POA: Diagnosis not present

## 2018-05-27 DIAGNOSIS — G931 Anoxic brain damage, not elsewhere classified: Secondary | ICD-10-CM | POA: Diagnosis not present

## 2018-05-27 DIAGNOSIS — I2510 Atherosclerotic heart disease of native coronary artery without angina pectoris: Secondary | ICD-10-CM | POA: Diagnosis not present

## 2018-05-27 DIAGNOSIS — Z8674 Personal history of sudden cardiac arrest: Secondary | ICD-10-CM | POA: Diagnosis not present

## 2018-05-27 DIAGNOSIS — I1311 Hypertensive heart and chronic kidney disease without heart failure, with stage 5 chronic kidney disease, or end stage renal disease: Secondary | ICD-10-CM | POA: Diagnosis not present

## 2018-05-28 DIAGNOSIS — D649 Anemia, unspecified: Secondary | ICD-10-CM | POA: Diagnosis not present

## 2018-05-28 DIAGNOSIS — E0869 Diabetes mellitus due to underlying condition with other specified complication: Secondary | ICD-10-CM | POA: Diagnosis not present

## 2018-05-28 DIAGNOSIS — D509 Iron deficiency anemia, unspecified: Secondary | ICD-10-CM | POA: Diagnosis not present

## 2018-05-28 DIAGNOSIS — N186 End stage renal disease: Secondary | ICD-10-CM | POA: Diagnosis not present

## 2018-05-28 DIAGNOSIS — Z23 Encounter for immunization: Secondary | ICD-10-CM | POA: Diagnosis not present

## 2018-05-28 DIAGNOSIS — E876 Hypokalemia: Secondary | ICD-10-CM | POA: Diagnosis not present

## 2018-05-29 DIAGNOSIS — Z8674 Personal history of sudden cardiac arrest: Secondary | ICD-10-CM | POA: Diagnosis not present

## 2018-05-29 DIAGNOSIS — E1142 Type 2 diabetes mellitus with diabetic polyneuropathy: Secondary | ICD-10-CM | POA: Diagnosis not present

## 2018-05-29 DIAGNOSIS — I1311 Hypertensive heart and chronic kidney disease without heart failure, with stage 5 chronic kidney disease, or end stage renal disease: Secondary | ICD-10-CM | POA: Diagnosis not present

## 2018-05-29 DIAGNOSIS — G931 Anoxic brain damage, not elsewhere classified: Secondary | ICD-10-CM | POA: Diagnosis not present

## 2018-05-29 DIAGNOSIS — E1122 Type 2 diabetes mellitus with diabetic chronic kidney disease: Secondary | ICD-10-CM | POA: Diagnosis not present

## 2018-05-29 DIAGNOSIS — I2510 Atherosclerotic heart disease of native coronary artery without angina pectoris: Secondary | ICD-10-CM | POA: Diagnosis not present

## 2018-05-30 DIAGNOSIS — D509 Iron deficiency anemia, unspecified: Secondary | ICD-10-CM | POA: Diagnosis not present

## 2018-05-30 DIAGNOSIS — Z23 Encounter for immunization: Secondary | ICD-10-CM | POA: Diagnosis not present

## 2018-05-30 DIAGNOSIS — D649 Anemia, unspecified: Secondary | ICD-10-CM | POA: Diagnosis not present

## 2018-05-30 DIAGNOSIS — E0869 Diabetes mellitus due to underlying condition with other specified complication: Secondary | ICD-10-CM | POA: Diagnosis not present

## 2018-05-30 DIAGNOSIS — N186 End stage renal disease: Secondary | ICD-10-CM | POA: Diagnosis not present

## 2018-05-30 DIAGNOSIS — E876 Hypokalemia: Secondary | ICD-10-CM | POA: Diagnosis not present

## 2018-06-02 DIAGNOSIS — E0869 Diabetes mellitus due to underlying condition with other specified complication: Secondary | ICD-10-CM | POA: Diagnosis not present

## 2018-06-02 DIAGNOSIS — Z23 Encounter for immunization: Secondary | ICD-10-CM | POA: Diagnosis not present

## 2018-06-02 DIAGNOSIS — D509 Iron deficiency anemia, unspecified: Secondary | ICD-10-CM | POA: Diagnosis not present

## 2018-06-02 DIAGNOSIS — D649 Anemia, unspecified: Secondary | ICD-10-CM | POA: Diagnosis not present

## 2018-06-02 DIAGNOSIS — N186 End stage renal disease: Secondary | ICD-10-CM | POA: Diagnosis not present

## 2018-06-02 DIAGNOSIS — E876 Hypokalemia: Secondary | ICD-10-CM | POA: Diagnosis not present

## 2018-06-03 ENCOUNTER — Ambulatory Visit (INDEPENDENT_AMBULATORY_CARE_PROVIDER_SITE_OTHER): Payer: Medicare Other | Admitting: Gastroenterology

## 2018-06-03 ENCOUNTER — Encounter: Payer: Self-pay | Admitting: Gastroenterology

## 2018-06-03 ENCOUNTER — Telehealth: Payer: Self-pay | Admitting: Gastroenterology

## 2018-06-03 ENCOUNTER — Encounter (INDEPENDENT_AMBULATORY_CARE_PROVIDER_SITE_OTHER): Payer: Self-pay

## 2018-06-03 VITALS — BP 110/40 | HR 80 | Ht 64.0 in | Wt 159.1 lb

## 2018-06-03 DIAGNOSIS — Z794 Long term (current) use of insulin: Secondary | ICD-10-CM

## 2018-06-03 DIAGNOSIS — E1122 Type 2 diabetes mellitus with diabetic chronic kidney disease: Secondary | ICD-10-CM | POA: Diagnosis not present

## 2018-06-03 DIAGNOSIS — D649 Anemia, unspecified: Secondary | ICD-10-CM

## 2018-06-03 DIAGNOSIS — Z992 Dependence on renal dialysis: Secondary | ICD-10-CM

## 2018-06-03 DIAGNOSIS — I1311 Hypertensive heart and chronic kidney disease without heart failure, with stage 5 chronic kidney disease, or end stage renal disease: Secondary | ICD-10-CM

## 2018-06-03 DIAGNOSIS — Z800 Family history of malignant neoplasm of digestive organs: Secondary | ICD-10-CM

## 2018-06-03 DIAGNOSIS — Z85820 Personal history of malignant melanoma of skin: Secondary | ICD-10-CM

## 2018-06-03 DIAGNOSIS — R194 Change in bowel habit: Secondary | ICD-10-CM

## 2018-06-03 DIAGNOSIS — I472 Ventricular tachycardia: Secondary | ICD-10-CM

## 2018-06-03 DIAGNOSIS — Z7982 Long term (current) use of aspirin: Secondary | ICD-10-CM

## 2018-06-03 DIAGNOSIS — Z8701 Personal history of pneumonia (recurrent): Secondary | ICD-10-CM

## 2018-06-03 DIAGNOSIS — H409 Unspecified glaucoma: Secondary | ICD-10-CM

## 2018-06-03 DIAGNOSIS — Z955 Presence of coronary angioplasty implant and graft: Secondary | ICD-10-CM

## 2018-06-03 DIAGNOSIS — N186 End stage renal disease: Secondary | ICD-10-CM | POA: Diagnosis not present

## 2018-06-03 DIAGNOSIS — Z8619 Personal history of other infectious and parasitic diseases: Secondary | ICD-10-CM | POA: Diagnosis not present

## 2018-06-03 DIAGNOSIS — F329 Major depressive disorder, single episode, unspecified: Secondary | ICD-10-CM

## 2018-06-03 DIAGNOSIS — K649 Unspecified hemorrhoids: Secondary | ICD-10-CM

## 2018-06-03 DIAGNOSIS — E1142 Type 2 diabetes mellitus with diabetic polyneuropathy: Secondary | ICD-10-CM | POA: Diagnosis not present

## 2018-06-03 DIAGNOSIS — Z8674 Personal history of sudden cardiac arrest: Secondary | ICD-10-CM | POA: Diagnosis not present

## 2018-06-03 DIAGNOSIS — A0472 Enterocolitis due to Clostridium difficile, not specified as recurrent: Secondary | ICD-10-CM | POA: Diagnosis not present

## 2018-06-03 DIAGNOSIS — Z7902 Long term (current) use of antithrombotics/antiplatelets: Secondary | ICD-10-CM

## 2018-06-03 DIAGNOSIS — H9190 Unspecified hearing loss, unspecified ear: Secondary | ICD-10-CM

## 2018-06-03 DIAGNOSIS — Z951 Presence of aortocoronary bypass graft: Secondary | ICD-10-CM

## 2018-06-03 DIAGNOSIS — Z8673 Personal history of transient ischemic attack (TIA), and cerebral infarction without residual deficits: Secondary | ICD-10-CM

## 2018-06-03 DIAGNOSIS — G931 Anoxic brain damage, not elsewhere classified: Secondary | ICD-10-CM | POA: Diagnosis not present

## 2018-06-03 DIAGNOSIS — Z79899 Other long term (current) drug therapy: Secondary | ICD-10-CM

## 2018-06-03 DIAGNOSIS — I2510 Atherosclerotic heart disease of native coronary artery without angina pectoris: Secondary | ICD-10-CM | POA: Diagnosis not present

## 2018-06-03 DIAGNOSIS — M5416 Radiculopathy, lumbar region: Secondary | ICD-10-CM | POA: Diagnosis not present

## 2018-06-03 MED ORDER — HYDROCORTISONE ACETATE 25 MG RE SUPP: 25.0000 mg | Freq: Two times a day (BID) | RECTAL | 1 refills | Status: DC

## 2018-06-03 MED ORDER — HYDROCORTISONE 2.5 % RE CREA: 1.0000 "application " | TOPICAL_CREAM | Freq: Two times a day (BID) | RECTAL | 1 refills | Status: DC

## 2018-06-03 NOTE — Patient Instructions (Addendum)
Use a daily fiber agent such as Benefiber, metamucil, or citrucel.   We have sent the following medications to your pharmacy for you to pick up at your convenience: Anusol suppositories rectally at bedtime  Call back next month to schedule a colonoscopy with Dr. Tarri Glenn.   Thank you for trusting me with your gastrointestinal care!    Thornton Park, MD, MPH

## 2018-06-03 NOTE — Progress Notes (Signed)
Referring Provider: Meredith Staggers, MD Primary Care Physician:  Josetta Huddle, MD   Reason for Consultation:  Stool card +, anemia   IMPRESSION:  Change in bowel habits - diarrhea with fecal soiling Chronic diarrhea having 6 non-bloody bowel movements daily Hemorrhoids with rectal pain and bleeding C Diff 03/3018 treated with 10 days of vancomycin 125 mg QID    - follow-up C Diff testing negative 04/21/18 Uses a wheelchair when not at home Father with colon cancer in his 55s  PLAN: Colonoscopy with random biopsies at the hospital to exclude microscopic colitis Daily use of psyllium bulking agent (Benefiber, Metamucil, or Citrucel) Anusol HC suppositories  I consented the patient at the bedside today discussing the risks, benefits, and alternatives to endoscopic evaluation. In particular, we discussed the risks that include, but are not limited to, reaction to medication, cardiopulmonary compromise, bleeding requiring blood transfusion, aspiration resulting in pneumonia, perforation requiring surgery, and even death. We reviewed the risk of missed lesion including polyps or even cancer. The patient acknowledges these risks and asks that we proceed.  HPI: [name] is a 77 y.o. 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 Buelah Manis for further evaluation of a positive stool card, anemia, and iron deficiency.  The history is obtained through the patient, her daughter who is a CNA and accompanies her to this appointment, and review of 27 pages of referral records.  She has end-stage renal disease on HD, coronary artery disease status post remote bypass surgery, diabetes requiring insulin and mild protein calorie malnutrition. Recently hospitalized for VF cardiac arrest status post stenting/VDRF.  Hospitalization was complicated by aspiration PNA requiring 5 days of Zosyn. Developed C Diff that was treated with vancomycin 125 mg QID completing 10 days of therapy 04/13/18. C Diff  antigen negative 04/21/18. Had hemorrhoid symptoms that  improved with the use of Anusol suppository. Stool test for blood was positive prior to discharge.   Problems with hemorrhoids since she was a teenager with pain, bleeding. Treated with preparation H at home.  Continues to be bothered by them, especially with recent diarrhea. Notes diarrhea had deveoped prior to her hospitalization. Was seen by primary care provider who was concerned about a hemorrhoid before that hospitalization.  Stools are now normal with a BM TID. In the past, she has had 6 BM daily attributed to IBS by recommendation of Dr. Velora Heckler. She would treat the diarrhea with immodium that would result in constipation and exacerbate her hemorrhoids. She is taking probiotic and fiber. She is rarely using colchicine.  She still remains on allopurinol 200 mg daily for treatment of her gout.  Occassional blood from hemorrhoids. Now with stool leakage since the time of C Diff. Fecal soiling occurs in the day and nocturnally. No urgency or hesitancy. No sense of incomplete evacuation. Required rectal tube was hospitalized.   She is most concerned about hemorrhoid pain.   Labs from 05/07/2018 show a hemoglobin of 9.3, ferritin 902, transferrin saturation of 18, platelets 210.  She had a colonoscopy and upper endoscopy with Dr. Oletta Lamas 07/25/2011 for FOBT+ stools.  EGD revealed mild antral gastritis and was otherwise normal.  No biopsies were obtained.  The colonoscopy was normal other than internal hemorrhoids.  Repeat colonoscopy was recommended in 10 years. She reports history of colon polyps on a prior colonoscopy with Dr. Velora Heckler. "Polyp in 2001" showed benign polypoid mucosa.   Father with colon cancer in his 76s. Daughter with one precancerous colon polyp.  No other known family history of colon cancer or polyps.  Past Medical History:  Diagnosis Date  . Anemia   . Anxiety   . Arthritis   . Atrial fibrillation ([place])   . Blood  transfusion   . C. difficile diarrhea   . CAD (coronary artery disease)   . Carotid artery occlusion    Carotid Endartectom,y - left 2009.  Blockage Right being watched by Dr Scot Dock.  . Carotid stenosis   . 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 Complication of anesthesia    pt. states that she was difficult to wake  . Depression   . Diabetes mellitus without complication ([place])   . Diverticulosis   . Dysrhythmia   . General weakness 12/2015  . GERD (gastroesophageal reflux disease)   . Glaucoma   . History of hiatal hernia   . History of kidney stones    passed  . History of pneumonia   . HOH (hard of hearing)   . Hypertension   . Hypokalemia 12/2015  . Melanoma ([place])    .  top of head- melonoma  . Myocardial infarction ([place])   . Neuromuscular disorder ([place])    CARPEL TUNNEL  . Pneumonia   . Shortness of breath   . Stroke ([place])    hx of TIA    Past Surgical History:  Procedure Laterality Date  . A/V FISTULAGRAM N/A 07/21/2017   Procedure: A/V FISTULAGRAM - Left Arm;  Surgeon: Angelia Mould, MD;  Location: [place] [facility];  Service: Cardiovascular;  Laterality: N/A;  . AV FISTULA PLACEMENT Left 05/31/2014   Procedure: Creation of Left Arm arteriovenous brachiocephalic Fistula;  Surgeon: Angelia Mould, MD;  Location: [place];  Service: Vascular;  Laterality: Left;  . AV FISTULA PLACEMENT Left 09/01/2014   Procedure: INSERTION OF ARTERIOVENOUS (AV) GORE-TEX GRAFT LEFT UPPER ARM USING  4-7 MM X 45 CM SRTETCH GORETEX GRAFT;  Surgeon: Angelia Mould, MD;  Location: [place];  Service: Vascular;  Laterality: Left;  . BACK SURGERY    . CARDIAC CATHETERIZATION    . CAROTID ENDARTERECTOMY Left 2009    CEA  . carpel tunnel    . COLONOSCOPY  07/25/2011   Procedure: COLONOSCOPY;  Surgeon: Winfield Cunas., MD;  Location: [facility] ENDOSCOPY;  Service: Endoscopy;  Laterality: N/A;  . CORONARY ARTERY BYPASS GRAFT  07/29/2011   Procedure: CORONARY ARTERY  BYPASS GRAFTING (CABG);  Surgeon: Gaye Pollack, MD;  Location: [place];  Service: Open Heart Surgery;  Laterality: N/A;  . CORONARY STENT INTERVENTION N/A 04/08/2018   Procedure: CORONARY STENT INTERVENTION;  Surgeon: Jettie Booze, MD;  Location: [place] [facility];  Service: Cardiovascular;  Laterality: N/A;  . ENDARTERECTOMY Right 04/21/2015   Procedure: ENDARTERECTOMY CAROTID;  Surgeon: Angelia Mould, MD;  Location: [place];  Service: Vascular;  Laterality: Right;  . ESOPHAGOGASTRODUODENOSCOPY  07/25/2011   Procedure: ESOPHAGOGASTRODUODENOSCOPY (EGD);  Surgeon: Winfield Cunas., MD;  Location: [facility] ENDOSCOPY;  Service: Endoscopy;  Laterality: N/A;  . EYE SURGERY    . FISTULOGRAM Left 08/29/2014   Procedure: FISTULOGRAM;  Surgeon: Angelia Mould, MD;  Location: [facility] CATH LAB;  Service: Cardiovascular;  Laterality: Left;  . KNEE SURGERY Left   . LEFT HEART CATH AND CORS/GRAFTS ANGIOGRAPHY N/A 04/08/2018   Procedure: LEFT HEART CATH AND CORS/GRAFTS ANGIOGRAPHY;  Surgeon: Jettie Booze, MD;  Location: [place] [facility];  Service: Cardiovascular;  Laterality: N/A;  .  LUMBAR LAMINECTOMY/DECOMPRESSION MICRODISCECTOMY N/A 12/01/2015   Procedure: L5-S1 Decompression and Bilateral Microdiscectomy;  Surgeon: Marybelle Killings, MD;  Location: [place];  Service: Orthopedics;  Laterality: N/A;  . NECK SURGERY    . PERIPHERAL VASCULAR BALLOON ANGIOPLASTY  07/21/2017   Procedure: PERIPHERAL VASCULAR BALLOON ANGIOPLASTY;  Surgeon: Angelia Mould, MD;  Location: [place] [facility];  Service: Cardiovascular;;  LT Arm AVF  . TONSILLECTOMY      Current Outpatient Medications  Medication Sig Dispense Refill  . acetaminophen (TYLENOL) 325 MG tablet Take 2 tablets (650 mg total) by mouth every 6 (six) hours as needed for mild pain.    Marland Kitchen allopurinol (ZYLOPRIM) 100 MG tablet Take 100 mg by mouth daily.     Marland Kitchen aspirin EC 81 MG tablet Take 1 tablet (81 mg total) by mouth daily. 90 tablet 3  .  atorvastatin (LIPITOR) 40 MG tablet Take 1 tablet (40 mg total) by mouth daily at 6 PM. 30 tablet 1  . b complex-C-folic acid 1 MG capsule Take 1 capsule by mouth daily.    . brimonidine (ALPHAGAN P) 0.1 % SOLN Place 1 drop into the left eye 2 (two) times daily.    . calcium carbonate (TUMS - DOSED IN MG ELEMENTAL CALCIUM) 500 MG chewable tablet Chew 1 tablet (200 mg of elemental calcium total) by mouth every 6 (six) hours as needed for indigestion or heartburn.    . Cholecalciferol (VITAMIN D) 2000 UNITS tablet Take 2,000 Units by mouth 2 (two) times daily.    . colchicine 0.6 MG tablet Take 0.6 mg by mouth 2 (two) times daily as needed (for gout flares).     . Cyanocobalamin (B-12) 5000 MCG CAPS Take 5,000 mcg by mouth daily.    . hydrALAZINE (APRESOLINE) 50 MG tablet Take 1 tablet (50 mg total) by mouth 2 (two) times daily. (Patient taking differently: Take 50 mg by mouth 2 (two) times daily. 1 tablet a day tues, thurs, sat. BID every other day) 180 tablet 3  . Hydrocortisone (GERHARDT'S BUTT CREAM) CREA Apply 5 application topically as needed for irritation. 5 each 1  . Insulin Glargine (LANTUS) 100 UNIT/ML Solostar Pen Inject 15 Units into the skin daily. 15 mL 11  . Insulin Pen Needle (PEN NEEDLES) 31G X 8 MM MISC 15 Units by Does not apply route daily. 100 each 9  . Omega-3 Fatty Acids (FISH OIL) 1200 MG CAPS Take 1,200 mg by mouth 2 (two) times daily.    Marland Kitchen omeprazole (PRILOSEC) 20 MG capsule Take 20 mg by mouth daily as needed (acid reflux).     . polycarbophil (FIBERCON) 625 MG tablet Take 1 tablet (625 mg total) by mouth daily. 30 tablet 0  . PROCTOSOL HC 2.5 % rectal cream Place 1 application rectally 2 (two) times daily as needed for hemorrhoids.   1  . saccharomyces boulardii (FLORASTOR) 250 MG capsule Take 1 capsule (250 mg total) by mouth 2 (two) times daily. 60 capsule 0  . sevelamer carbonate (RENVELA) 800 MG tablet Take 1 tablet (800 mg total) by mouth 3 (three) times daily with  meals. 90 tablet 0  . ticagrelor (BRILINTA) 90 MG TABS tablet Take 1 tablet (90 mg total) by mouth 2 (two) times daily. 180 tablet 3  . timolol (TIMOPTIC) 0.5 % ophthalmic solution Place 1 drop into the left eye daily.    . travoprost, benzalkonium, (TRAVATAN) 0.004 % ophthalmic solution Place 1 drop into both eyes at bedtime.     Marland Kitchen  nitroGLYCERIN (NITROSTAT) 0.4 MG SL tablet Place 1 tablet (0.4 mg total) under the tongue every 5 (five) minutes as needed for chest pain. (Patient not taking: Reported on 06/03/2018) 25 tablet 3   No current facility-administered medications for this visit.     Allergies as of 06/03/2018 - Review Complete 06/03/2018  Allergen Reaction Noted  . Neurontin [gabapentin] Other (See Comments) 03/30/2015  . Sulfa antibiotics Other (See Comments) 11/30/2015  . Norvasc [amlodipine] Other (See Comments) 11/30/2015    Family History  Problem Relation Age of Onset  . Diabetes Mother   . Hypertension Mother   . Heart disease Mother        before age 23  . Heart attack Mother   . Stroke Mother   . Hyperlipidemia Father   . Hypertension Father   . Colon polyps Father   . Lung cancer Father   . Deep vein thrombosis Daughter   . Diabetes Daughter   . Hyperlipidemia Daughter   . Hypertension Daughter   . Heart disease Daughter   . Peripheral vascular disease Daughter   . Breast cancer Neg Hx     Social History   Socioeconomic History  . Marital status: Married    Spouse name: Not on file  . Number of children: 2  . Years of education: Not on file  . Highest education level: Not on file  Occupational History  . Occupation: retired  Scientific laboratory technician  . Financial resource strain: Not on file  . Food insecurity:    Worry: Not on file    Inability: Not on file  . Transportation needs:    Medical: Not on file    Non-medical: Not on file  Tobacco Use  . Smoking status: Never Smoker  . Smokeless tobacco: Never Used  Substance and Sexual Activity  . Alcohol  use: No    Alcohol/week: 0.0 standard drinks  . Drug use: No  . Sexual activity: Not Currently    Birth control/protection: Post-menopausal  Lifestyle  . Physical activity:    Days per week: Not on file    Minutes per session: Not on file  . Stress: Not on file  Relationships  . Social connections:    Talks on phone: Not on file    Gets together: Not on file    Attends religious service: Not on file    Active member of club or organization: Not on file    Attends meetings of clubs or organizations: Not on file    Relationship status: Not on file  . Intimate partner violence:    Fear of current or ex partner: Not on file    Emotionally abused: Not on file    Physically abused: Not on file    Forced sexual activity: Not on file  Other Topics Concern  . Not on file  Social History Narrative  . Not on file    Review of Systems: 12 system ROS is negative except as noted above.  Filed Weights   06/03/18 1346  Weight: 159 lb 2 oz (72.2 kg)    Physical Exam: Vital signs were reviewed. General:   Alert, well-nourished, pleasant and cooperative in NAD Head:  Normocephalic and atraumatic. Eyes:  Sclera clear, no icterus.   Conjunctiva pink. Mouth:  No deformity or lesions.   Neck:  Supple; no thyromegaly. Lungs:  Clear throughout to auscultation.   No wheezes.  Heart:  Regular rate and rhythm; no murmurs Abdomen:  Soft, nontender, normal bowel sounds. No rebound  or guarding. No hepatosplenomegaly Rectal:  Deferred  Msk:  Symmetrical without gross deformities. Extremities:  No gross deformities or edema. Neurologic:  Alert and  oriented x4;  grossly nonfocal Skin:  No rash or bruise. Psych:  Alert and cooperative. Normal mood and affect.   [name] L. Tarri Glenn Md, MPH [facility] 06/03/2018, 2:36 PM

## 2018-06-03 NOTE — Telephone Encounter (Signed)
Rx for anusol cream sent to pharmacy, we discussed this in the office before patient left to apply to a glycerin suppository and insert rectally at bedtime.

## 2018-06-04 DIAGNOSIS — D509 Iron deficiency anemia, unspecified: Secondary | ICD-10-CM | POA: Diagnosis not present

## 2018-06-04 DIAGNOSIS — Z23 Encounter for immunization: Secondary | ICD-10-CM | POA: Diagnosis not present

## 2018-06-04 DIAGNOSIS — E0869 Diabetes mellitus due to underlying condition with other specified complication: Secondary | ICD-10-CM | POA: Diagnosis not present

## 2018-06-04 DIAGNOSIS — E876 Hypokalemia: Secondary | ICD-10-CM | POA: Diagnosis not present

## 2018-06-04 DIAGNOSIS — N186 End stage renal disease: Secondary | ICD-10-CM | POA: Diagnosis not present

## 2018-06-04 DIAGNOSIS — D649 Anemia, unspecified: Secondary | ICD-10-CM | POA: Diagnosis not present

## 2018-06-06 DIAGNOSIS — N186 End stage renal disease: Secondary | ICD-10-CM | POA: Diagnosis not present

## 2018-06-06 DIAGNOSIS — D509 Iron deficiency anemia, unspecified: Secondary | ICD-10-CM | POA: Diagnosis not present

## 2018-06-06 DIAGNOSIS — E876 Hypokalemia: Secondary | ICD-10-CM | POA: Diagnosis not present

## 2018-06-06 DIAGNOSIS — E0869 Diabetes mellitus due to underlying condition with other specified complication: Secondary | ICD-10-CM | POA: Diagnosis not present

## 2018-06-06 DIAGNOSIS — Z23 Encounter for immunization: Secondary | ICD-10-CM | POA: Diagnosis not present

## 2018-06-06 DIAGNOSIS — D649 Anemia, unspecified: Secondary | ICD-10-CM | POA: Diagnosis not present

## 2018-06-08 ENCOUNTER — Ambulatory Visit: Payer: Medicare Other | Admitting: Gastroenterology

## 2018-06-08 DIAGNOSIS — D509 Iron deficiency anemia, unspecified: Secondary | ICD-10-CM | POA: Insufficient documentation

## 2018-06-08 DIAGNOSIS — D649 Anemia, unspecified: Secondary | ICD-10-CM | POA: Diagnosis not present

## 2018-06-08 DIAGNOSIS — Z23 Encounter for immunization: Secondary | ICD-10-CM | POA: Diagnosis not present

## 2018-06-08 DIAGNOSIS — E0869 Diabetes mellitus due to underlying condition with other specified complication: Secondary | ICD-10-CM | POA: Diagnosis not present

## 2018-06-08 DIAGNOSIS — N186 End stage renal disease: Secondary | ICD-10-CM | POA: Diagnosis not present

## 2018-06-08 DIAGNOSIS — E876 Hypokalemia: Secondary | ICD-10-CM | POA: Diagnosis not present

## 2018-06-10 DIAGNOSIS — D649 Anemia, unspecified: Secondary | ICD-10-CM | POA: Diagnosis not present

## 2018-06-10 DIAGNOSIS — E876 Hypokalemia: Secondary | ICD-10-CM | POA: Diagnosis not present

## 2018-06-10 DIAGNOSIS — E0869 Diabetes mellitus due to underlying condition with other specified complication: Secondary | ICD-10-CM | POA: Diagnosis not present

## 2018-06-10 DIAGNOSIS — Z23 Encounter for immunization: Secondary | ICD-10-CM | POA: Diagnosis not present

## 2018-06-10 DIAGNOSIS — N186 End stage renal disease: Secondary | ICD-10-CM | POA: Diagnosis not present

## 2018-06-10 DIAGNOSIS — D509 Iron deficiency anemia, unspecified: Secondary | ICD-10-CM | POA: Diagnosis not present

## 2018-06-13 DIAGNOSIS — D509 Iron deficiency anemia, unspecified: Secondary | ICD-10-CM | POA: Diagnosis not present

## 2018-06-13 DIAGNOSIS — E876 Hypokalemia: Secondary | ICD-10-CM | POA: Diagnosis not present

## 2018-06-13 DIAGNOSIS — N186 End stage renal disease: Secondary | ICD-10-CM | POA: Diagnosis not present

## 2018-06-13 DIAGNOSIS — E0869 Diabetes mellitus due to underlying condition with other specified complication: Secondary | ICD-10-CM | POA: Diagnosis not present

## 2018-06-13 DIAGNOSIS — Z23 Encounter for immunization: Secondary | ICD-10-CM | POA: Diagnosis not present

## 2018-06-13 DIAGNOSIS — D649 Anemia, unspecified: Secondary | ICD-10-CM | POA: Diagnosis not present

## 2018-06-14 DIAGNOSIS — E1129 Type 2 diabetes mellitus with other diabetic kidney complication: Secondary | ICD-10-CM | POA: Diagnosis not present

## 2018-06-14 DIAGNOSIS — N186 End stage renal disease: Secondary | ICD-10-CM | POA: Diagnosis not present

## 2018-06-14 DIAGNOSIS — Z992 Dependence on renal dialysis: Secondary | ICD-10-CM | POA: Diagnosis not present

## 2018-06-16 DIAGNOSIS — N186 End stage renal disease: Secondary | ICD-10-CM | POA: Diagnosis not present

## 2018-06-16 DIAGNOSIS — D509 Iron deficiency anemia, unspecified: Secondary | ICD-10-CM | POA: Diagnosis not present

## 2018-06-16 DIAGNOSIS — Z23 Encounter for immunization: Secondary | ICD-10-CM | POA: Diagnosis not present

## 2018-06-16 DIAGNOSIS — E0869 Diabetes mellitus due to underlying condition with other specified complication: Secondary | ICD-10-CM | POA: Diagnosis not present

## 2018-06-16 DIAGNOSIS — E876 Hypokalemia: Secondary | ICD-10-CM | POA: Diagnosis not present

## 2018-06-18 DIAGNOSIS — Z23 Encounter for immunization: Secondary | ICD-10-CM | POA: Diagnosis not present

## 2018-06-18 DIAGNOSIS — E876 Hypokalemia: Secondary | ICD-10-CM | POA: Diagnosis not present

## 2018-06-18 DIAGNOSIS — E0869 Diabetes mellitus due to underlying condition with other specified complication: Secondary | ICD-10-CM | POA: Diagnosis not present

## 2018-06-18 DIAGNOSIS — N186 End stage renal disease: Secondary | ICD-10-CM | POA: Diagnosis not present

## 2018-06-18 DIAGNOSIS — D509 Iron deficiency anemia, unspecified: Secondary | ICD-10-CM | POA: Diagnosis not present

## 2018-06-20 DIAGNOSIS — E876 Hypokalemia: Secondary | ICD-10-CM | POA: Diagnosis not present

## 2018-06-20 DIAGNOSIS — N186 End stage renal disease: Secondary | ICD-10-CM | POA: Diagnosis not present

## 2018-06-20 DIAGNOSIS — E0869 Diabetes mellitus due to underlying condition with other specified complication: Secondary | ICD-10-CM | POA: Diagnosis not present

## 2018-06-20 DIAGNOSIS — D509 Iron deficiency anemia, unspecified: Secondary | ICD-10-CM | POA: Diagnosis not present

## 2018-06-20 DIAGNOSIS — Z23 Encounter for immunization: Secondary | ICD-10-CM | POA: Diagnosis not present

## 2018-06-23 DIAGNOSIS — N186 End stage renal disease: Secondary | ICD-10-CM | POA: Diagnosis not present

## 2018-06-23 DIAGNOSIS — D509 Iron deficiency anemia, unspecified: Secondary | ICD-10-CM | POA: Diagnosis not present

## 2018-06-23 DIAGNOSIS — E0869 Diabetes mellitus due to underlying condition with other specified complication: Secondary | ICD-10-CM | POA: Diagnosis not present

## 2018-06-23 DIAGNOSIS — E876 Hypokalemia: Secondary | ICD-10-CM | POA: Diagnosis not present

## 2018-06-23 DIAGNOSIS — Z23 Encounter for immunization: Secondary | ICD-10-CM | POA: Diagnosis not present

## 2018-06-24 ENCOUNTER — Ambulatory Visit
Admission: RE | Admit: 2018-06-24 | Discharge: 2018-06-24 | Disposition: A | Discharge status: Home / Self Care | Payer: Medicare Other | Source: Ambulatory Visit | Attending: Internal Medicine | Admitting: Internal Medicine

## 2018-06-24 DIAGNOSIS — Z1231 Encounter for screening mammogram for malignant neoplasm of breast: Secondary | ICD-10-CM

## 2018-06-24 DIAGNOSIS — E1142 Type 2 diabetes mellitus with diabetic polyneuropathy: Secondary | ICD-10-CM | POA: Diagnosis not present

## 2018-06-24 DIAGNOSIS — Z8674 Personal history of sudden cardiac arrest: Secondary | ICD-10-CM | POA: Diagnosis not present

## 2018-06-24 DIAGNOSIS — I1311 Hypertensive heart and chronic kidney disease without heart failure, with stage 5 chronic kidney disease, or end stage renal disease: Secondary | ICD-10-CM | POA: Diagnosis not present

## 2018-06-24 DIAGNOSIS — G931 Anoxic brain damage, not elsewhere classified: Secondary | ICD-10-CM | POA: Diagnosis not present

## 2018-06-24 DIAGNOSIS — I2510 Atherosclerotic heart disease of native coronary artery without angina pectoris: Secondary | ICD-10-CM | POA: Diagnosis not present

## 2018-06-24 DIAGNOSIS — E1122 Type 2 diabetes mellitus with diabetic chronic kidney disease: Secondary | ICD-10-CM | POA: Diagnosis not present

## 2018-06-25 DIAGNOSIS — E876 Hypokalemia: Secondary | ICD-10-CM | POA: Diagnosis not present

## 2018-06-25 DIAGNOSIS — E0869 Diabetes mellitus due to underlying condition with other specified complication: Secondary | ICD-10-CM | POA: Diagnosis not present

## 2018-06-25 DIAGNOSIS — Z23 Encounter for immunization: Secondary | ICD-10-CM | POA: Diagnosis not present

## 2018-06-25 DIAGNOSIS — N186 End stage renal disease: Secondary | ICD-10-CM | POA: Diagnosis not present

## 2018-06-25 DIAGNOSIS — D509 Iron deficiency anemia, unspecified: Secondary | ICD-10-CM | POA: Diagnosis not present

## 2018-06-27 DIAGNOSIS — E876 Hypokalemia: Secondary | ICD-10-CM | POA: Diagnosis not present

## 2018-06-27 DIAGNOSIS — Z23 Encounter for immunization: Secondary | ICD-10-CM | POA: Diagnosis not present

## 2018-06-27 DIAGNOSIS — N186 End stage renal disease: Secondary | ICD-10-CM | POA: Diagnosis not present

## 2018-06-27 DIAGNOSIS — D509 Iron deficiency anemia, unspecified: Secondary | ICD-10-CM | POA: Diagnosis not present

## 2018-06-27 DIAGNOSIS — E0869 Diabetes mellitus due to underlying condition with other specified complication: Secondary | ICD-10-CM | POA: Diagnosis not present

## 2018-06-30 DIAGNOSIS — E876 Hypokalemia: Secondary | ICD-10-CM | POA: Diagnosis not present

## 2018-06-30 DIAGNOSIS — N186 End stage renal disease: Secondary | ICD-10-CM | POA: Diagnosis not present

## 2018-06-30 DIAGNOSIS — Z23 Encounter for immunization: Secondary | ICD-10-CM | POA: Diagnosis not present

## 2018-06-30 DIAGNOSIS — E0869 Diabetes mellitus due to underlying condition with other specified complication: Secondary | ICD-10-CM | POA: Diagnosis not present

## 2018-06-30 DIAGNOSIS — D509 Iron deficiency anemia, unspecified: Secondary | ICD-10-CM | POA: Diagnosis not present

## 2018-07-02 DIAGNOSIS — E876 Hypokalemia: Secondary | ICD-10-CM | POA: Diagnosis not present

## 2018-07-02 DIAGNOSIS — D509 Iron deficiency anemia, unspecified: Secondary | ICD-10-CM | POA: Diagnosis not present

## 2018-07-02 DIAGNOSIS — Z23 Encounter for immunization: Secondary | ICD-10-CM | POA: Diagnosis not present

## 2018-07-02 DIAGNOSIS — E0869 Diabetes mellitus due to underlying condition with other specified complication: Secondary | ICD-10-CM | POA: Diagnosis not present

## 2018-07-02 DIAGNOSIS — N186 End stage renal disease: Secondary | ICD-10-CM | POA: Diagnosis not present

## 2018-07-04 DIAGNOSIS — N186 End stage renal disease: Secondary | ICD-10-CM | POA: Diagnosis not present

## 2018-07-04 DIAGNOSIS — E0869 Diabetes mellitus due to underlying condition with other specified complication: Secondary | ICD-10-CM | POA: Diagnosis not present

## 2018-07-04 DIAGNOSIS — E876 Hypokalemia: Secondary | ICD-10-CM | POA: Diagnosis not present

## 2018-07-04 DIAGNOSIS — Z23 Encounter for immunization: Secondary | ICD-10-CM | POA: Diagnosis not present

## 2018-07-04 DIAGNOSIS — D509 Iron deficiency anemia, unspecified: Secondary | ICD-10-CM | POA: Diagnosis not present

## 2018-07-06 DIAGNOSIS — N186 End stage renal disease: Secondary | ICD-10-CM | POA: Diagnosis not present

## 2018-07-06 DIAGNOSIS — D509 Iron deficiency anemia, unspecified: Secondary | ICD-10-CM | POA: Diagnosis not present

## 2018-07-06 DIAGNOSIS — E876 Hypokalemia: Secondary | ICD-10-CM | POA: Diagnosis not present

## 2018-07-06 DIAGNOSIS — E0869 Diabetes mellitus due to underlying condition with other specified complication: Secondary | ICD-10-CM | POA: Diagnosis not present

## 2018-07-06 DIAGNOSIS — Z23 Encounter for immunization: Secondary | ICD-10-CM | POA: Diagnosis not present

## 2018-07-09 DIAGNOSIS — D509 Iron deficiency anemia, unspecified: Secondary | ICD-10-CM | POA: Diagnosis not present

## 2018-07-09 DIAGNOSIS — E0869 Diabetes mellitus due to underlying condition with other specified complication: Secondary | ICD-10-CM | POA: Diagnosis not present

## 2018-07-09 DIAGNOSIS — E876 Hypokalemia: Secondary | ICD-10-CM | POA: Diagnosis not present

## 2018-07-09 DIAGNOSIS — Z23 Encounter for immunization: Secondary | ICD-10-CM | POA: Diagnosis not present

## 2018-07-09 DIAGNOSIS — N186 End stage renal disease: Secondary | ICD-10-CM | POA: Diagnosis not present

## 2018-07-11 DIAGNOSIS — E876 Hypokalemia: Secondary | ICD-10-CM | POA: Diagnosis not present

## 2018-07-11 DIAGNOSIS — Z23 Encounter for immunization: Secondary | ICD-10-CM | POA: Diagnosis not present

## 2018-07-11 DIAGNOSIS — N186 End stage renal disease: Secondary | ICD-10-CM | POA: Diagnosis not present

## 2018-07-11 DIAGNOSIS — E0869 Diabetes mellitus due to underlying condition with other specified complication: Secondary | ICD-10-CM | POA: Diagnosis not present

## 2018-07-11 DIAGNOSIS — D509 Iron deficiency anemia, unspecified: Secondary | ICD-10-CM | POA: Diagnosis not present

## 2018-07-12 ENCOUNTER — Other Ambulatory Visit: Payer: Self-pay

## 2018-07-12 ENCOUNTER — Encounter (HOSPITAL_COMMUNITY): Payer: Self-pay | Admitting: Emergency Medicine

## 2018-07-12 ENCOUNTER — Emergency Department (HOSPITAL_COMMUNITY)
Admission: EM | Admit: 2018-07-12 | Discharge: 2018-07-12 | Disposition: A | Discharge status: Home / Self Care | Payer: Medicare Other | Attending: Emergency Medicine | Admitting: Emergency Medicine

## 2018-07-12 DIAGNOSIS — Z79899 Other long term (current) drug therapy: Secondary | ICD-10-CM | POA: Diagnosis not present

## 2018-07-12 DIAGNOSIS — I120 Hypertensive chronic kidney disease with stage 5 chronic kidney disease or end stage renal disease: Secondary | ICD-10-CM | POA: Diagnosis not present

## 2018-07-12 DIAGNOSIS — N186 End stage renal disease: Secondary | ICD-10-CM | POA: Insufficient documentation

## 2018-07-12 DIAGNOSIS — I2510 Atherosclerotic heart disease of native coronary artery without angina pectoris: Secondary | ICD-10-CM | POA: Insufficient documentation

## 2018-07-12 DIAGNOSIS — Z992 Dependence on renal dialysis: Secondary | ICD-10-CM | POA: Diagnosis not present

## 2018-07-12 DIAGNOSIS — Z794 Long term (current) use of insulin: Secondary | ICD-10-CM | POA: Diagnosis not present

## 2018-07-12 DIAGNOSIS — R197 Diarrhea, unspecified: Secondary | ICD-10-CM | POA: Insufficient documentation

## 2018-07-12 DIAGNOSIS — I252 Old myocardial infarction: Secondary | ICD-10-CM | POA: Insufficient documentation

## 2018-07-12 DIAGNOSIS — Z955 Presence of coronary angioplasty implant and graft: Secondary | ICD-10-CM | POA: Insufficient documentation

## 2018-07-12 DIAGNOSIS — E1122 Type 2 diabetes mellitus with diabetic chronic kidney disease: Secondary | ICD-10-CM | POA: Diagnosis not present

## 2018-07-12 DIAGNOSIS — Z7982 Long term (current) use of aspirin: Secondary | ICD-10-CM | POA: Diagnosis not present

## 2018-07-12 MED ORDER — VANCOMYCIN HCL 125 MG PO CAPS: 125.0000 mg | ORAL_CAPSULE | Freq: Four times a day (QID) | ORAL | 0 refills | Status: DC

## 2018-07-12 NOTE — ED Provider Notes (Signed)
[facility] EMERGENCY DEPARTMENT Provider Note   CSN: [id number] Arrival date & time: 07/12/18  1226     History   Chief Complaint Chief Complaint  Patient presents with  . Diarrhea    HPI [name] is a 77 y.o. female.  . 77 year old female presents with 2 days of increased formed stools.  Does have remote history of C. difficile 2 months ago.  Patient had been on Zosyn.  She denies any fever, abdominal pain.  No emesis.  No weakness with standing.  No recent antibiotic use.  Family is concerned because the symptoms are similar to when she had C. difficile in the past.     Past Medical History:  Diagnosis Date  . Anemia   . Anxiety   . Arthritis   . Atrial fibrillation ([place])   . Blood transfusion   . C. difficile diarrhea   . CAD (coronary artery disease)   . Carotid artery occlusion    Carotid Endartectom,y - left 2009.  Blockage Right being watched by Dr Scot Dock.  . Carotid stenosis   . 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 Complication of anesthesia    pt. states that she was difficult to wake  . Depression   . Diabetes mellitus without complication ([place])   . Diverticulosis   . Dysrhythmia   . General weakness 12/2015  . GERD (gastroesophageal reflux disease)   . Glaucoma   . History of hiatal hernia   . History of kidney stones    passed  . History of pneumonia   . HOH (hard of hearing)   . Hypertension   . Hypokalemia 12/2015  . Melanoma ([place])    .  top of head- melonoma  . Myocardial infarction ([place])   . Neuromuscular disorder ([place])    CARPEL TUNNEL  . Pneumonia   . Shortness of breath   . Stroke ([place])    hx of TIA    Patient Active Problem List   Diagnosis Date Noted  . Hemorrhoids   . Diabetes mellitus type 2 in nonobese (HCC)   . Anemia, chronic disease   . Labile blood pressure   . 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 Debility   . Benign essential HTN    . Bradycardia   . Nonsustained ventricular tachycardia ([place])   . Enteritis due to Clostridium difficile   . ESRD on dialysis ([place])   . Diabetic peripheral neuropathy ([place])   . Acute blood loss anemia   . Anemia of chronic disease   . Aspiration into airway   . NSTEMI (non-ST elevated myocardial infarction) ([place])   . QT prolongation 04/03/2018  . Acute respiratory failure with hypoxia ([place])   . Aspiration pneumonia due to gastric secretions ([place])   . Cardiac arrest ([place]) 04/02/2018  . Anxiety 05/13/2017  . Panic attack 05/13/2017  . SAH (subarachnoid hemorrhage) ([place]) 02/03/2017  . Fall 02/03/2017  . Spinal stenosis, lumbar region 03/02/2016  . Trochanteric bursitis of left hip 01/03/2016  . Generalized weakness 12/21/2015  . Acute hypokalemia 12/21/2015  . Lumbar back pain with radiculopathy affecting left lower extremity 12/04/2015  . Coronary artery disease involving native coronary artery of native heart without angina pectoris   . CRI (chronic renal insufficiency), stage 4 (severe) (HCC)   . Urinary retention   . Anemia of chronic kidney failure   . Hx of gout   . Thrombocytopenia ([place])   .  S/P lumbar discectomy 12/01/2015  . 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 Acute encephalopathy   . Chronic kidney disease (CKD), stage V ([place]) 07/13/2015  . Glaucoma   . Atrial fibrillation ([place])   . CAD (coronary artery disease)   . Depression   . Stroke ([place])   . Diabetes mellitus without complication ([place])   . Gastroesophageal reflux disease with esophagitis   . Carotid artery stenosis 04/21/2015  . Essential hypertension 06/08/2014  . Hyperlipidemia 06/08/2014  . Occlusion and stenosis of carotid artery without mention of cerebral infarction 01/01/2012    Past Surgical History:  Procedure Laterality Date  . A/V FISTULAGRAM N/A 07/21/2017   Procedure: A/V FISTULAGRAM - Left Arm;  Surgeon:  Angelia Mould, MD;  Location: [facility];  Service: Cardiovascular;  Laterality: N/A;  . AV FISTULA PLACEMENT Left 05/31/2014   Procedure: Creation of Left Arm arteriovenous brachiocephalic Fistula;  Surgeon: Angelia Mould, MD;  Location: [facility];  Service: Vascular;  Laterality: Left;  . AV FISTULA PLACEMENT Left 09/01/2014   Procedure: INSERTION OF ARTERIOVENOUS (AV) GORE-TEX GRAFT LEFT UPPER ARM USING  4-7 MM X 45 CM SRTETCH GORETEX GRAFT;  Surgeon: Angelia Mould, MD;  Location: [facility];  Service: Vascular;  Laterality: Left;  . BACK SURGERY    . CARDIAC CATHETERIZATION    . CAROTID ENDARTERECTOMY Left 2009    CEA  . carpel tunnel    . COLONOSCOPY  07/25/2011   Procedure: COLONOSCOPY;  Surgeon: Winfield Cunas., MD;  Location: [facility] ENDOSCOPY;  Service: Endoscopy;  Laterality: N/A;  . CORONARY ARTERY BYPASS GRAFT  07/29/2011   Procedure: CORONARY ARTERY BYPASS GRAFTING (CABG);  Surgeon: Gaye Pollack, MD;  Location: [facility];  Service: Open Heart Surgery;  Laterality: N/A;  . CORONARY STENT INTERVENTION N/A 04/08/2018   Procedure: CORONARY STENT INTERVENTION;  Surgeon: Jettie Booze, MD;  Location: [facility];  Service: Cardiovascular;  Laterality: N/A;  . ENDARTERECTOMY Right 04/21/2015   Procedure: ENDARTERECTOMY CAROTID;  Surgeon: Angelia Mould, MD;  Location: [facility];  Service: Vascular;  Laterality: Right;  . ESOPHAGOGASTRODUODENOSCOPY  07/25/2011   Procedure: ESOPHAGOGASTRODUODENOSCOPY (EGD);  Surgeon: Winfield Cunas., MD;  Location: [facility] ENDOSCOPY;  Service: Endoscopy;  Laterality: N/A;  . EYE SURGERY    . FISTULOGRAM Left 08/29/2014   Procedure: FISTULOGRAM;  Surgeon: Angelia Mould, MD;  Location: [facility] CATH LAB;  Service: Cardiovascular;  Laterality: Left;  . KNEE SURGERY Left   . LEFT HEART CATH AND CORS/GRAFTS ANGIOGRAPHY N/A 04/08/2018   Procedure: LEFT HEART CATH AND CORS/GRAFTS ANGIOGRAPHY;  Surgeon: Jettie Booze, MD;   Location: [facility] CV LAB;  Service: Cardiovascular;  Laterality: N/A;  . LUMBAR LAMINECTOMY/DECOMPRESSION MICRODISCECTOMY N/A 12/01/2015   Procedure: L5-S1 Decompression and Bilateral Microdiscectomy;  Surgeon: Marybelle Killings, MD;  Location: [facility];  Service: Orthopedics;  Laterality: N/A;  . NECK SURGERY    . PERIPHERAL VASCULAR BALLOON ANGIOPLASTY  07/21/2017   Procedure: PERIPHERAL VASCULAR BALLOON ANGIOPLASTY;  Surgeon: Angelia Mould, MD;  Location: [facility] CV LAB;  Service: Cardiovascular;;  LT Arm AVF  . TONSILLECTOMY       OB History   No obstetric history on file.      Home Medications    Prior to Admission medications   Medication Sig Start Date End Date Taking? Authorizing Provider  acetaminophen (TYLENOL) 325 MG tablet Take 2 tablets (650  mg total) by mouth every 6 (six) hours as needed for mild pain. 12/15/15   Love, Ivan Anchors, PA-C  allopurinol (ZYLOPRIM) 100 MG tablet Take 100 mg by mouth daily.     [provider]  aspirin EC 81 MG tablet Take 1 tablet (81 mg total) by mouth daily. 09/30/17   Jettie Booze, MD  atorvastatin (LIPITOR) 40 MG tablet Take 1 tablet (40 mg total) by mouth daily at 6 PM. 04/28/18   Angiulli, Lavon Paganini, PA-C  b complex-C-folic acid 1 MG capsule Take 1 capsule by mouth daily.    [provider]  brimonidine (ALPHAGAN P) 0.1 % SOLN Place 1 drop into the left eye 2 (two) times daily.    [provider]  calcium carbonate (TUMS - DOSED IN MG ELEMENTAL CALCIUM) 500 MG chewable tablet Chew 1 tablet (200 mg of elemental calcium total) by mouth every 6 (six) hours as needed for indigestion or heartburn. 04/28/18   Angiulli, Lavon Paganini, PA-C  Cholecalciferol (VITAMIN D) 2000 UNITS tablet Take 2,000 Units by mouth 2 (two) times daily.    [provider]  colchicine 0.6 MG tablet Take 0.6 mg by mouth 2 (two) times daily as needed (for gout flares).     [provider]  Cyanocobalamin (B-12) 5000 MCG CAPS  Take 5,000 mcg by mouth daily.    [provider]  hydrALAZINE (APRESOLINE) 50 MG tablet Take 1 tablet (50 mg total) by mouth 2 (two) times daily. Patient taking differently: Take 50 mg by mouth 2 (two) times daily. 1 tablet a day tues, thurs, sat. BID every other day 05/06/18   Consuelo Pandy, PA-C  hydrocortisone (ANUSOL-HC) 2.5 % rectal cream Place 1 application rectally 2 (two) times daily. Apply to a glycerin suppository and insert rectally. 06/03/18   Thornton Park, MD  hydrocortisone (ANUSOL-HC) 25 MG suppository Place 1 suppository (25 mg total) rectally every 12 (twelve) hours. 06/03/18   Thornton Park, MD  Hydrocortisone (GERHARDT'S BUTT CREAM) CREA Apply 5 application topically as needed for irritation. 04/28/18   Angiulli, Lavon Paganini, PA-C  Insulin Glargine (LANTUS) 100 UNIT/ML Solostar Pen Inject 15 Units into the skin daily. 04/28/18   Angiulli, Lavon Paganini, PA-C  Insulin Pen Needle (PEN NEEDLES) 31G X 8 MM MISC 15 Units by Does not apply route daily. 04/30/18   Meredith Staggers, MD  nitroGLYCERIN (NITROSTAT) 0.4 MG SL tablet Place 1 tablet (0.4 mg total) under the tongue every 5 (five) minutes as needed for chest pain. Patient not taking: Reported on 06/03/2018 05/06/18   Lyda Jester M, PA-C  Omega-3 Fatty Acids (FISH OIL) 1200 MG CAPS Take 1,200 mg by mouth 2 (two) times daily.    [provider]  omeprazole (PRILOSEC) 20 MG capsule Take 20 mg by mouth daily as needed (acid reflux).     [provider]  polycarbophil (FIBERCON) 625 MG tablet Take 1 tablet (625 mg total) by mouth daily. 04/28/18   Angiulli, Lavon Paganini, PA-C  saccharomyces boulardii (FLORASTOR) 250 MG capsule Take 1 capsule (250 mg total) by mouth 2 (two) times daily. 04/28/18   Angiulli, Lavon Paganini, PA-C  sevelamer carbonate (RENVELA) 800 MG tablet Take 1 tablet (800 mg total) by mouth 3 (three) times daily with meals. 04/28/18   Angiulli, Lavon Paganini, PA-C  ticagrelor (BRILINTA) 90  MG TABS tablet Take 1 tablet (90 mg total) by mouth 2 (two) times daily. 05/06/18   Lyda Jester M, PA-C  timolol (TIMOPTIC) 0.5 %  ophthalmic solution Place 1 drop into the left eye daily.    [provider]  travoprost, benzalkonium, (TRAVATAN) 0.004 % ophthalmic solution Place 1 drop into both eyes at bedtime.     [provider]    Family History Family History  Problem Relation Age of Onset  . Diabetes Mother   . Hypertension Mother   . Heart disease Mother        before age 37  . Heart attack Mother   . Stroke Mother   . Hyperlipidemia Father   . Hypertension Father   . Colon polyps Father   . Lung cancer Father   . Deep vein thrombosis Daughter   . Diabetes Daughter   . Hyperlipidemia Daughter   . Hypertension Daughter   . Heart disease Daughter   . Peripheral vascular disease Daughter   . Breast cancer Neg Hx     Social History Social History   Tobacco Use  . Smoking status: Never Smoker  . Smokeless tobacco: Never Used  Substance Use Topics  . Alcohol use: No    Alcohol/week: 0.0 standard drinks  . Drug use: No     Allergies   Neurontin [gabapentin]; Sulfa antibiotics; and Norvasc [amlodipine]   Review of Systems Review of Systems  All other systems reviewed and are negative.    Physical Exam Updated Vital Signs BP 121/60   Pulse 74   Temp 98.7 F (37.1 C) (Oral)   Resp 16   Ht 1.626 m (5\' 4" )   Wt 76.2 kg   SpO2 96%   BMI 28.84 kg/m   Physical Exam Vitals signs and nursing note reviewed.  Constitutional:      General: She is not in acute distress.    Appearance: Normal appearance. She is well-developed. She is not toxic-appearing.  HENT:     Head: Normocephalic and atraumatic.  Eyes:     General: Lids are normal.     Conjunctiva/sclera: Conjunctivae normal.     Pupils: Pupils are equal, round, and reactive to light.  Neck:     Musculoskeletal: Normal range of motion and neck supple.     Thyroid: No thyroid  mass.     Trachea: No tracheal deviation.  Cardiovascular:     Rate and Rhythm: Normal rate and regular rhythm.     Heart sounds: Normal heart sounds. No murmur. No gallop.   Pulmonary:     Effort: Pulmonary effort is normal. No respiratory distress.     Breath sounds: Normal breath sounds. No stridor. No decreased breath sounds, wheezing, rhonchi or rales.  Abdominal:     General: Bowel sounds are normal. There is no distension.     Palpations: Abdomen is soft.     Tenderness: There is no abdominal tenderness. There is no rebound.  Musculoskeletal: Normal range of motion.        General: No tenderness.  Skin:    General: Skin is warm and dry.     Findings: No abrasion or rash.  Neurological:     Mental Status: She is alert and oriented to person, place, and time.     GCS: GCS eye subscore is 4. GCS verbal subscore is 5. GCS motor subscore is 6.     Cranial Nerves: No cranial nerve deficit.     Sensory: No sensory deficit.  Psychiatric:        Speech: Speech normal.        Behavior: Behavior normal.      ED Treatments /  Results  Labs (all labs ordered are listed, but only abnormal results are displayed) Labs Reviewed  C DIFFICILE QUICK SCREEN W PCR REFLEX  GASTROINTESTINAL PANEL BY PCR, STOOL (REPLACES STOOL CULTURE)    EKG None  Radiology No results found.  Procedures Procedures (including critical care time)  Medications Ordered in ED Medications - No data to display   Initial Impression / Assessment and Plan / ED Course  I have reviewed the triage vital signs and the nursing notes.  Pertinent labs & imaging results that were available during my care of the patient were reviewed by me and considered in my medical decision making (see chart for details).    Patient stool was too formed to have C. difficile PCR performed.  Patient to be given prescription in case her symptoms worsen as well will follow-up with her doctor  Final Clinical Impressions(s) / ED  Diagnoses   Final diagnoses:  None    ED Discharge Orders    None       Lacretia Leigh, MD 07/12/18 1552

## 2018-07-12 NOTE — ED Triage Notes (Signed)
Onset 2 days ago have been leaking stool soft and family states this happened before and was diagnosed with cdiff 2 months ago. Denies abdominal pain but did have pain yesterday during dialysis.

## 2018-07-13 ENCOUNTER — Encounter: Payer: Medicare Other | Admitting: Physical Medicine & Rehabilitation

## 2018-07-13 DIAGNOSIS — E0869 Diabetes mellitus due to underlying condition with other specified complication: Secondary | ICD-10-CM | POA: Diagnosis not present

## 2018-07-13 DIAGNOSIS — D509 Iron deficiency anemia, unspecified: Secondary | ICD-10-CM | POA: Diagnosis not present

## 2018-07-13 DIAGNOSIS — Z23 Encounter for immunization: Secondary | ICD-10-CM | POA: Diagnosis not present

## 2018-07-13 DIAGNOSIS — N186 End stage renal disease: Secondary | ICD-10-CM | POA: Diagnosis not present

## 2018-07-13 DIAGNOSIS — E876 Hypokalemia: Secondary | ICD-10-CM | POA: Diagnosis not present

## 2018-07-13 LAB — GASTROINTESTINAL PANEL BY PCR, STOOL (REPLACES STOOL CULTURE)

## 2018-07-15 DIAGNOSIS — Z992 Dependence on renal dialysis: Secondary | ICD-10-CM | POA: Diagnosis not present

## 2018-07-15 DIAGNOSIS — E1129 Type 2 diabetes mellitus with other diabetic kidney complication: Secondary | ICD-10-CM | POA: Diagnosis not present

## 2018-07-15 DIAGNOSIS — N186 End stage renal disease: Secondary | ICD-10-CM | POA: Diagnosis not present

## 2018-07-16 ENCOUNTER — Telehealth: Payer: Self-pay | Admitting: Gastroenterology

## 2018-07-16 DIAGNOSIS — D631 Anemia in chronic kidney disease: Secondary | ICD-10-CM | POA: Diagnosis not present

## 2018-07-16 DIAGNOSIS — E0869 Diabetes mellitus due to underlying condition with other specified complication: Secondary | ICD-10-CM | POA: Diagnosis not present

## 2018-07-16 DIAGNOSIS — Z23 Encounter for immunization: Secondary | ICD-10-CM | POA: Diagnosis not present

## 2018-07-16 DIAGNOSIS — N186 End stage renal disease: Secondary | ICD-10-CM | POA: Diagnosis not present

## 2018-07-16 DIAGNOSIS — E876 Hypokalemia: Secondary | ICD-10-CM | POA: Diagnosis not present

## 2018-07-16 NOTE — Telephone Encounter (Signed)
Left message on patients voicemail to call office.   

## 2018-07-16 NOTE — Telephone Encounter (Signed)
Pt needs to schedule colonoscopy with Dr. Tarri Glenn at the hospital. Pls try her mobile phone first if no answer, try her house phone.

## 2018-07-17 ENCOUNTER — Telehealth: Payer: Self-pay | Admitting: Gastroenterology

## 2018-07-17 DIAGNOSIS — L570 Actinic keratosis: Secondary | ICD-10-CM | POA: Diagnosis not present

## 2018-07-17 DIAGNOSIS — C4401 Basal cell carcinoma of skin of lip: Secondary | ICD-10-CM | POA: Diagnosis not present

## 2018-07-17 DIAGNOSIS — D692 Other nonthrombocytopenic purpura: Secondary | ICD-10-CM | POA: Diagnosis not present

## 2018-07-17 DIAGNOSIS — D485 Neoplasm of uncertain behavior of skin: Secondary | ICD-10-CM | POA: Diagnosis not present

## 2018-07-17 DIAGNOSIS — Z85828 Personal history of other malignant neoplasm of skin: Secondary | ICD-10-CM | POA: Diagnosis not present

## 2018-07-17 NOTE — Telephone Encounter (Signed)
Spoke with patients husband and informed him that we will call once Dr. Tarri Glenn hospital schedule comes out. He states she prefers a Monday because she has dialysis on Saturdays and it will be hard to prep on any other day but Sunday.

## 2018-07-17 NOTE — Telephone Encounter (Signed)
Pt husband return your call he stated that he has to go out but leave a msg and that mondays will work good for the appt.

## 2018-07-18 DIAGNOSIS — N186 End stage renal disease: Secondary | ICD-10-CM | POA: Diagnosis not present

## 2018-07-18 DIAGNOSIS — E0869 Diabetes mellitus due to underlying condition with other specified complication: Secondary | ICD-10-CM | POA: Diagnosis not present

## 2018-07-18 DIAGNOSIS — D631 Anemia in chronic kidney disease: Secondary | ICD-10-CM | POA: Diagnosis not present

## 2018-07-18 DIAGNOSIS — Z23 Encounter for immunization: Secondary | ICD-10-CM | POA: Diagnosis not present

## 2018-07-18 DIAGNOSIS — E876 Hypokalemia: Secondary | ICD-10-CM | POA: Diagnosis not present

## 2018-07-21 DIAGNOSIS — E876 Hypokalemia: Secondary | ICD-10-CM | POA: Diagnosis not present

## 2018-07-21 DIAGNOSIS — Z23 Encounter for immunization: Secondary | ICD-10-CM | POA: Diagnosis not present

## 2018-07-21 DIAGNOSIS — N186 End stage renal disease: Secondary | ICD-10-CM | POA: Diagnosis not present

## 2018-07-21 DIAGNOSIS — E0869 Diabetes mellitus due to underlying condition with other specified complication: Secondary | ICD-10-CM | POA: Diagnosis not present

## 2018-07-21 DIAGNOSIS — D631 Anemia in chronic kidney disease: Secondary | ICD-10-CM | POA: Diagnosis not present

## 2018-07-23 DIAGNOSIS — E876 Hypokalemia: Secondary | ICD-10-CM | POA: Diagnosis not present

## 2018-07-23 DIAGNOSIS — N186 End stage renal disease: Secondary | ICD-10-CM | POA: Diagnosis not present

## 2018-07-23 DIAGNOSIS — E0869 Diabetes mellitus due to underlying condition with other specified complication: Secondary | ICD-10-CM | POA: Diagnosis not present

## 2018-07-23 DIAGNOSIS — D631 Anemia in chronic kidney disease: Secondary | ICD-10-CM | POA: Diagnosis not present

## 2018-07-23 DIAGNOSIS — Z23 Encounter for immunization: Secondary | ICD-10-CM | POA: Diagnosis not present

## 2018-07-25 DIAGNOSIS — D631 Anemia in chronic kidney disease: Secondary | ICD-10-CM | POA: Diagnosis not present

## 2018-07-25 DIAGNOSIS — Z23 Encounter for immunization: Secondary | ICD-10-CM | POA: Diagnosis not present

## 2018-07-25 DIAGNOSIS — N186 End stage renal disease: Secondary | ICD-10-CM | POA: Diagnosis not present

## 2018-07-25 DIAGNOSIS — E876 Hypokalemia: Secondary | ICD-10-CM | POA: Diagnosis not present

## 2018-07-25 DIAGNOSIS — E0869 Diabetes mellitus due to underlying condition with other specified complication: Secondary | ICD-10-CM | POA: Diagnosis not present

## 2018-07-28 DIAGNOSIS — N186 End stage renal disease: Secondary | ICD-10-CM | POA: Diagnosis not present

## 2018-07-28 DIAGNOSIS — E0869 Diabetes mellitus due to underlying condition with other specified complication: Secondary | ICD-10-CM | POA: Diagnosis not present

## 2018-07-28 DIAGNOSIS — E876 Hypokalemia: Secondary | ICD-10-CM | POA: Diagnosis not present

## 2018-07-28 DIAGNOSIS — Z23 Encounter for immunization: Secondary | ICD-10-CM | POA: Diagnosis not present

## 2018-07-28 DIAGNOSIS — D631 Anemia in chronic kidney disease: Secondary | ICD-10-CM | POA: Diagnosis not present

## 2018-07-29 ENCOUNTER — Telehealth: Payer: Self-pay | Admitting: Gastroenterology

## 2018-07-29 NOTE — Telephone Encounter (Signed)
Spoke to patients husband will call when a procedure spot is available.

## 2018-07-30 ENCOUNTER — Ambulatory Visit: Payer: Medicare Other | Admitting: Interventional Cardiology

## 2018-07-30 DIAGNOSIS — N186 End stage renal disease: Secondary | ICD-10-CM | POA: Diagnosis not present

## 2018-07-30 DIAGNOSIS — D631 Anemia in chronic kidney disease: Secondary | ICD-10-CM | POA: Diagnosis not present

## 2018-07-30 DIAGNOSIS — E0869 Diabetes mellitus due to underlying condition with other specified complication: Secondary | ICD-10-CM | POA: Diagnosis not present

## 2018-07-30 DIAGNOSIS — E876 Hypokalemia: Secondary | ICD-10-CM | POA: Diagnosis not present

## 2018-07-30 DIAGNOSIS — Z23 Encounter for immunization: Secondary | ICD-10-CM | POA: Diagnosis not present

## 2018-08-01 DIAGNOSIS — Z23 Encounter for immunization: Secondary | ICD-10-CM | POA: Diagnosis not present

## 2018-08-01 DIAGNOSIS — E876 Hypokalemia: Secondary | ICD-10-CM | POA: Diagnosis not present

## 2018-08-01 DIAGNOSIS — D631 Anemia in chronic kidney disease: Secondary | ICD-10-CM | POA: Diagnosis not present

## 2018-08-01 DIAGNOSIS — E0869 Diabetes mellitus due to underlying condition with other specified complication: Secondary | ICD-10-CM | POA: Diagnosis not present

## 2018-08-01 DIAGNOSIS — N186 End stage renal disease: Secondary | ICD-10-CM | POA: Diagnosis not present

## 2018-08-04 DIAGNOSIS — E0869 Diabetes mellitus due to underlying condition with other specified complication: Secondary | ICD-10-CM | POA: Diagnosis not present

## 2018-08-04 DIAGNOSIS — D631 Anemia in chronic kidney disease: Secondary | ICD-10-CM | POA: Diagnosis not present

## 2018-08-04 DIAGNOSIS — E876 Hypokalemia: Secondary | ICD-10-CM | POA: Diagnosis not present

## 2018-08-04 DIAGNOSIS — N186 End stage renal disease: Secondary | ICD-10-CM | POA: Diagnosis not present

## 2018-08-04 DIAGNOSIS — Z23 Encounter for immunization: Secondary | ICD-10-CM | POA: Diagnosis not present

## 2018-08-06 DIAGNOSIS — E0869 Diabetes mellitus due to underlying condition with other specified complication: Secondary | ICD-10-CM | POA: Diagnosis not present

## 2018-08-06 DIAGNOSIS — E876 Hypokalemia: Secondary | ICD-10-CM | POA: Diagnosis not present

## 2018-08-06 DIAGNOSIS — D631 Anemia in chronic kidney disease: Secondary | ICD-10-CM | POA: Diagnosis not present

## 2018-08-06 DIAGNOSIS — N186 End stage renal disease: Secondary | ICD-10-CM | POA: Diagnosis not present

## 2018-08-06 DIAGNOSIS — Z23 Encounter for immunization: Secondary | ICD-10-CM | POA: Diagnosis not present

## 2018-08-08 DIAGNOSIS — E0869 Diabetes mellitus due to underlying condition with other specified complication: Secondary | ICD-10-CM | POA: Diagnosis not present

## 2018-08-08 DIAGNOSIS — Z23 Encounter for immunization: Secondary | ICD-10-CM | POA: Diagnosis not present

## 2018-08-08 DIAGNOSIS — N186 End stage renal disease: Secondary | ICD-10-CM | POA: Diagnosis not present

## 2018-08-08 DIAGNOSIS — E876 Hypokalemia: Secondary | ICD-10-CM | POA: Diagnosis not present

## 2018-08-08 DIAGNOSIS — D631 Anemia in chronic kidney disease: Secondary | ICD-10-CM | POA: Diagnosis not present

## 2018-08-10 DIAGNOSIS — C4401 Basal cell carcinoma of skin of lip: Secondary | ICD-10-CM | POA: Diagnosis not present

## 2018-08-11 DIAGNOSIS — N186 End stage renal disease: Secondary | ICD-10-CM | POA: Diagnosis not present

## 2018-08-11 DIAGNOSIS — D631 Anemia in chronic kidney disease: Secondary | ICD-10-CM | POA: Diagnosis not present

## 2018-08-11 DIAGNOSIS — Z23 Encounter for immunization: Secondary | ICD-10-CM | POA: Diagnosis not present

## 2018-08-11 DIAGNOSIS — E876 Hypokalemia: Secondary | ICD-10-CM | POA: Diagnosis not present

## 2018-08-11 DIAGNOSIS — E0869 Diabetes mellitus due to underlying condition with other specified complication: Secondary | ICD-10-CM | POA: Diagnosis not present

## 2018-08-13 DIAGNOSIS — D631 Anemia in chronic kidney disease: Secondary | ICD-10-CM | POA: Diagnosis not present

## 2018-08-13 DIAGNOSIS — E0869 Diabetes mellitus due to underlying condition with other specified complication: Secondary | ICD-10-CM | POA: Diagnosis not present

## 2018-08-13 DIAGNOSIS — Z23 Encounter for immunization: Secondary | ICD-10-CM | POA: Diagnosis not present

## 2018-08-13 DIAGNOSIS — E876 Hypokalemia: Secondary | ICD-10-CM | POA: Diagnosis not present

## 2018-08-13 DIAGNOSIS — N186 End stage renal disease: Secondary | ICD-10-CM | POA: Diagnosis not present

## 2018-08-14 ENCOUNTER — Telehealth: Payer: Self-pay | Admitting: Emergency Medicine

## 2018-08-14 NOTE — Telephone Encounter (Signed)
[facility] Pre-operative Risk Assessment     Request for surgical clearance:     Endoscopy Procedure  What type of surgery is being performed?     colonsocopy  When is this surgery scheduled?     09-14-18  What type of clearance is required ?   Pharmacy  Are there any medications that need to be held prior to surgery and how long? Brillinta 5 days  Practice name and name of physician performing surgery?      [facility]  What is your office phone and fax number?      Phone- [phone]  [phone]  Anesthesia type (None, local, MAC, general) ?       MAC

## 2018-08-14 NOTE — Addendum Note (Signed)
Addended by: Wyline Beady on: 08/14/2018 03:21 PM   Modules accepted: Orders, SmartSet

## 2018-08-15 DIAGNOSIS — N186 End stage renal disease: Secondary | ICD-10-CM | POA: Diagnosis not present

## 2018-08-15 DIAGNOSIS — D631 Anemia in chronic kidney disease: Secondary | ICD-10-CM | POA: Diagnosis not present

## 2018-08-15 DIAGNOSIS — Z992 Dependence on renal dialysis: Secondary | ICD-10-CM | POA: Diagnosis not present

## 2018-08-15 DIAGNOSIS — E876 Hypokalemia: Secondary | ICD-10-CM | POA: Diagnosis not present

## 2018-08-15 DIAGNOSIS — D509 Iron deficiency anemia, unspecified: Secondary | ICD-10-CM | POA: Diagnosis not present

## 2018-08-15 DIAGNOSIS — E0869 Diabetes mellitus due to underlying condition with other specified complication: Secondary | ICD-10-CM | POA: Diagnosis not present

## 2018-08-15 DIAGNOSIS — E1129 Type 2 diabetes mellitus with other diabetic kidney complication: Secondary | ICD-10-CM | POA: Diagnosis not present

## 2018-08-18 DIAGNOSIS — N186 End stage renal disease: Secondary | ICD-10-CM | POA: Diagnosis not present

## 2018-08-18 DIAGNOSIS — E876 Hypokalemia: Secondary | ICD-10-CM | POA: Diagnosis not present

## 2018-08-18 DIAGNOSIS — D509 Iron deficiency anemia, unspecified: Secondary | ICD-10-CM | POA: Diagnosis not present

## 2018-08-18 DIAGNOSIS — E0869 Diabetes mellitus due to underlying condition with other specified complication: Secondary | ICD-10-CM | POA: Diagnosis not present

## 2018-08-18 DIAGNOSIS — D631 Anemia in chronic kidney disease: Secondary | ICD-10-CM | POA: Diagnosis not present

## 2018-08-19 ENCOUNTER — Ambulatory Visit: Payer: Medicare Other | Admitting: Cardiology

## 2018-08-19 ENCOUNTER — Encounter: Payer: Self-pay | Admitting: Cardiology

## 2018-08-19 ENCOUNTER — Ambulatory Visit (INDEPENDENT_AMBULATORY_CARE_PROVIDER_SITE_OTHER): Payer: Medicare Other | Admitting: Cardiology

## 2018-08-19 VITALS — BP 122/78 | HR 69 | Ht 65.0 in | Wt 158.4 lb

## 2018-08-19 DIAGNOSIS — I480 Paroxysmal atrial fibrillation: Secondary | ICD-10-CM

## 2018-08-19 NOTE — Patient Instructions (Signed)
Medication Instructions:  none If you need a refill on your cardiac medications before your next appointment, please call your pharmacy.   Lab work: none If you have labs (blood work) drawn today and your tests are completely normal, you will receive your results only by: Marland Kitchen MyChart Message (if you have MyChart) OR . A paper copy in the mail If you have any lab test that is abnormal or we need to change your treatment, we will call you to review the results.  Testing/Procedures: none  Follow-Up: At [facility], you and your health needs are our priority.  As part of our continuing mission to provide you with exceptional heart care, we have created designated Provider Care Teams.  These Care Teams include your primary Cardiologist (physician) and Advanced Practice Providers (APPs -  Physician Assistants and Nurse Practitioners) who all work together to provide you with the care you need, when you need it. You will need a follow up appointment in 6 months.  Please call our office 2 months in advance to schedule this appointment.  You may see Larae Grooms, MD or one of the following Advanced Practice Providers on your designated Care Team:   [place], PA-C Melina Copa, PA-C . Ermalinda Barrios, PA-C  Any Other Special Instructions Will Be Listed Below (If Applicable).

## 2018-08-19 NOTE — Progress Notes (Signed)
08/19/2018 Martesha Niedermeier   Jul 10, 1941  [id number]  Primary Physician Josetta Huddle, MD Primary Cardiologist: Larae Grooms, MD  Electrophysiologist: None   Reason for Visit/CC: f/u for CAD   HPI:  [name] is a 78 y.o. female who is being seen today for cardiology follow up after recent cardiac arrest in the fall. I had seen her for initial post hospital f/u in October and she was doing well. I advised 2 month f/u w/ Dr. Irish Lack. She was scheduled to see him 07/30/18 but had to cancel. She has been rescheduled and placed on my schedule.   Her history includes end-stage renal disease on HD, coronary artery disease status post remote bypass surgery, diabetes, recent cardiac arrest/ NSTEMI treated w/ PCI.   In summary, Pt had wittnessed out of hospital cardiac arrest on 04/02/18. Upon EMS arrival initial rhythm was reportedly VF, for which she was defibrillated, but was then asystolic. She ultimately regained spontaneous circulation following approximately 12 to 13 minutes of downtime. She was then brought to the [facility], ED for further evaluation. In the ED, she was intubated for airway protection.  Twelve-lead ECG showed ventricular bigeminy. There were no apparent acute ischemic changes. Initial labs showed no notable electrolyte disturbances. Her lactate was 5 on presentation, and initial troponin I was 0.2. She was admitted to the pulmonary critical care service. Troponin peaked at 1.1. Echocardiogram showed decreased LVEF at 40 to 45% with inferolateral wall motion abnormalities. She underwent LHC on 9/25 and had PCI to occluded SVR-RCA, which was felt to be the culprit vessel. She was placed on DAPT w/ ASA and Brilinta, to be continued x 12 months. She was evaluated by EP for consideration for ICD. Given she had underwent PCI to occluded SVG and had had no recurrent ventricular arrhythmias post revascularization, there was felt to be no need for ICD. She continued to do  well from a cardiac standpoint. Her hospitalization however was complicated by aspiration PNA and she was treated with antibiotics. Pt also had a couple 3-4 second asymptomatic sinus pausesin the setting of cough spell. Suspect vagal episode. However, her BB was discontinued. Once stable from a medical standpoint, she was transition to inpatient rehab for therapy. She did well and was eventually discharged.   Today in clinic, she reports that she has done well. No cardiac complaints. She denies CP, dyspnea, palpitations, dizziness, syncope/ near syncope. Compliant w/ ASA and Brilinta. No abnormal bleeding.  HD is going well. EKG today shows NSR w/PVCs. HR in the 70s. BP is well controlled at 122/78.   In addition, pt is also scheduled for a colonoscopy in March. Our office received clearance request to hold Brilitna x 5 days. Pt reports that colonoscopy was ordered given recent recurrent C-diff. However she reports that this has cleared up. No issues in the last 2 weeks.    Cardiac Studies Procedures   CORONARY STENT INTERVENTION 04/08/18  LEFT HEART CATH AND CORS/GRAFTS ANGIOGRAPHY  Conclusion     Ost 1st Mrg lesion is 75% stenosed.  Prox Cx lesion is 100% stenosed. Left to left collaterals. Small AV groove circumflex.  Ost 1st Diag lesion is 100% stenosed. SVG to diag is patent.  Prox LAD lesion is 95% stenosed. LIMA to LAD is patent.  Prox RCA lesion is 95% stenosed. Mid Graft lesion is 95% stenosed in SVG to PLA.  A drug-eluting stent was successfully placed using a STENT RESOLUTE ONYX 2.0X15.  Post intervention, there is a 0%  residual stenosis.  LV end diastolic pressure is normal.  There is no aortic valve stenosis.  Calcified femoral artery.    Recommend uninterrupted dual antiplatelet therapy with Aspirin 81mg  daily and Ticagrelor 90mg  twice dailyfor a minimum of 12 months (ACS - Class I recommendation).   2D Echo 04/03/18 Study Conclusions  - Left  ventricle: The cavity size was normal. Wall thickness was increased in a pattern of mild LVH. Systolic function was mildly reduced. The estimated ejection fraction was in the range of 45% to 50%. There is akinesis of the basal-midinferolateral and inferior myocardium. Doppler parameters are consistent with abnormal left ventricular relaxation (grade 1 diastolic dysfunction). - Mitral valve: Calcified annulus. There was mild regurgitation.  Impressions:  - Akinesis of the basal/mid inferior and inferolateral walls with overall mildly reduced LV systolic function; mild diastolic dysfunction; mild LVH; mild MR.   Current Meds  Medication Sig  . acetaminophen (TYLENOL) 325 MG tablet Take 2 tablets (650 mg total) by mouth every 6 (six) hours as needed for mild pain.  Marland Kitchen allopurinol (ZYLOPRIM) 100 MG tablet Take 100 mg by mouth daily.   Marland Kitchen aspirin EC 81 MG tablet Take 1 tablet (81 mg total) by mouth daily.  Marland Kitchen atorvastatin (LIPITOR) 40 MG tablet Take 1 tablet (40 mg total) by mouth daily at 6 PM.  . b complex-C-folic acid 1 MG capsule Take 1 capsule by mouth daily.  . brimonidine (ALPHAGAN P) 0.1 % SOLN Place 1 drop into the left eye 2 (two) times daily.  . calcium carbonate (TUMS - DOSED IN MG ELEMENTAL CALCIUM) 500 MG chewable tablet Chew 1 tablet (200 mg of elemental calcium total) by mouth every 6 (six) hours as needed for indigestion or heartburn.  . Cholecalciferol (VITAMIN D) 2000 UNITS tablet Take 2,000 Units by mouth 2 (two) times daily.  . colchicine 0.6 MG tablet Take 0.6 mg by mouth 2 (two) times daily as needed (for gout flares).   . Cyanocobalamin (B-12) 5000 MCG CAPS Take 5,000 mcg by mouth daily.  . hydrALAZINE (APRESOLINE) 25 MG tablet Take 25 mg by mouth daily.  . hydrocortisone (ANUSOL-HC) 2.5 % rectal cream Place 1 application rectally 2 (two) times daily. Apply to a glycerin suppository and insert rectally.  . hydrocortisone (ANUSOL-HC) 25 MG suppository  Place 1 suppository (25 mg total) rectally every 12 (twelve) hours.  . Hydrocortisone (GERHARDT'S BUTT CREAM) CREA Apply 5 application topically as needed for irritation.  . Insulin Glargine (LANTUS) 100 UNIT/ML Solostar Pen Inject 15 Units into the skin daily.  . Insulin Pen Needle (PEN NEEDLES) 31G X 8 MM MISC 15 Units by Does not apply route daily.  . nitroGLYCERIN (NITROSTAT) 0.4 MG SL tablet Place 1 tablet (0.4 mg total) under the tongue every 5 (five) minutes as needed for chest pain.  . Omega-3 Fatty Acids (FISH OIL) 1200 MG CAPS Take 1,200 mg by mouth 2 (two) times daily.  Marland Kitchen omeprazole (PRILOSEC) 20 MG capsule Take 20 mg by mouth daily as needed (acid reflux).   . polycarbophil (FIBERCON) 625 MG tablet Take 1 tablet (625 mg total) by mouth daily.  Marland Kitchen saccharomyces boulardii (FLORASTOR) 250 MG capsule Take 1 capsule (250 mg total) by mouth 2 (two) times daily.  . sevelamer carbonate (RENVELA) 800 MG tablet Take 1 tablet (800 mg total) by mouth 3 (three) times daily with meals.  . ticagrelor (BRILINTA) 90 MG TABS tablet Take 1 tablet (90 mg total) by mouth 2 (two) times daily.  . timolol (TIMOPTIC)  0.5 % ophthalmic solution Place 1 drop into the left eye daily.  . travoprost, benzalkonium, (TRAVATAN) 0.004 % ophthalmic solution Place 1 drop into both eyes at bedtime.    Allergies  Allergen Reactions  . Neurontin [Gabapentin] Other (See Comments)    Hallucinations and "Makes me go crazy"   . Sulfa Antibiotics Other (See Comments)    Altered mental state   . Norvasc [Amlodipine] Other (See Comments)    "Makes my legs swell"; edema   Past Medical History:  Diagnosis Date  . Anemia   . Anxiety   . Arthritis   . Atrial fibrillation ([place])   . Blood transfusion   . C. difficile diarrhea   . CAD (coronary artery disease)   . Carotid artery occlusion    Carotid Endartectom,y - left 2009.  Blockage Right being watched by Dr Scot Dock.  . Carotid stenosis   . 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 Complication of anesthesia    pt. states that she was difficult to wake  . Depression   . Diabetes mellitus without complication ([place])   . Diverticulosis   . Dysrhythmia   . General weakness 12/2015  . GERD (gastroesophageal reflux disease)   . Glaucoma   . History of hiatal hernia   . History of kidney stones    passed  . History of pneumonia   . HOH (hard of hearing)   . Hypertension   . Hypokalemia 12/2015  . Melanoma ([place])    .  top of head- melonoma  . Myocardial infarction ([place])   . Neuromuscular disorder ([place])    CARPEL TUNNEL  . Pneumonia   . Shortness of breath   . Stroke ([place])    hx of TIA   Family History  Problem Relation Age of Onset  . Diabetes Mother   . Hypertension Mother   . Heart disease Mother        before age 37  . Heart attack Mother   . Stroke Mother   . Hyperlipidemia Father   . Hypertension Father   . Colon polyps Father   . Lung cancer Father   . Deep vein thrombosis Daughter   . Diabetes Daughter   . Hyperlipidemia Daughter   . Hypertension Daughter   . Heart disease Daughter   . Peripheral vascular disease Daughter   . Breast cancer Neg Hx    Past Surgical History:  Procedure Laterality Date  . A/V FISTULAGRAM N/A 07/21/2017   Procedure: A/V FISTULAGRAM - Left Arm;  Surgeon: Angelia Mould, MD;  Location: [place] [facility];  Service: Cardiovascular;  Laterality: N/A;  . AV FISTULA PLACEMENT Left 05/31/2014   Procedure: Creation of Left Arm arteriovenous brachiocephalic Fistula;  Surgeon: Angelia Mould, MD;  Location: [facility];  Service: Vascular;  Laterality: Left;  . AV FISTULA PLACEMENT Left 09/01/2014   Procedure: INSERTION OF ARTERIOVENOUS (AV) GORE-TEX GRAFT LEFT UPPER ARM USING  4-7 MM X 45 CM SRTETCH GORETEX GRAFT;  Surgeon: Angelia Mould, MD;  Location: [facility];  Service: Vascular;  Laterality: Left;  . BACK SURGERY    . CARDIAC CATHETERIZATION    . CAROTID ENDARTERECTOMY Left 2009      CEA  . carpel tunnel    . COLONOSCOPY  07/25/2011   Procedure: COLONOSCOPY;  Surgeon: Winfield Cunas., MD;  Location: [facility] ENDOSCOPY;  Service: Endoscopy;  Laterality: N/A;  . CORONARY ARTERY BYPASS GRAFT  07/29/2011   Procedure:  CORONARY ARTERY BYPASS GRAFTING (CABG);  Surgeon: Gaye Pollack, MD;  Location: [facility];  Service: Open Heart Surgery;  Laterality: N/A;  . CORONARY STENT INTERVENTION N/A 04/08/2018   Procedure: CORONARY STENT INTERVENTION;  Surgeon: Jettie Booze, MD;  Location: [place] [facility];  Service: Cardiovascular;  Laterality: N/A;  . ENDARTERECTOMY Right 04/21/2015   Procedure: ENDARTERECTOMY CAROTID;  Surgeon: Angelia Mould, MD;  Location: [place];  Service: Vascular;  Laterality: Right;  . ESOPHAGOGASTRODUODENOSCOPY  07/25/2011   Procedure: ESOPHAGOGASTRODUODENOSCOPY (EGD);  Surgeon: Winfield Cunas., MD;  Location: [facility] ENDOSCOPY;  Service: Endoscopy;  Laterality: N/A;  . EYE SURGERY    . FISTULOGRAM Left 08/29/2014   Procedure: FISTULOGRAM;  Surgeon: Angelia Mould, MD;  Location: [facility] C[facility] LAB;  Service: Cardiovascular;  Laterality: Left;  . KNEE SURGERY Left   . LEFT HEART CATH AND CORS/GRAFTS ANGIOGRAPHY N/A 04/08/2018   Procedure: LEFT HEART CATH AND CORS/GRAFTS ANGIOGRAPHY;  Surgeon: Jettie Booze, MD;  Location: [place] [facility];  Service: Cardiovascular;  Laterality: N/A;  . LUMBAR LAMINECTOMY/DECOMPRESSION MICRODISCECTOMY N/A 12/01/2015   Procedure: L5-S1 Decompression and Bilateral Microdiscectomy;  Surgeon: Marybelle Killings, MD;  Location: [place];  Service: Orthopedics;  Laterality: N/A;  . NECK SURGERY    . PERIPHERAL VASCULAR BALLOON ANGIOPLASTY  07/21/2017   Procedure: PERIPHERAL VASCULAR BALLOON ANGIOPLASTY;  Surgeon: Angelia Mould, MD;  Location: [place] [facility];  Service: Cardiovascular;;  LT Arm AVF  . TONSILLECTOMY     Social History   Socioeconomic History  . Marital status: Married    Spouse name: Not on file  .  Number of children: 2  . Years of education: Not on file  . Highest education level: Not on file  Occupational History  . Occupation: retired  Scientific laboratory technician  . Financial resource strain: Not on file  . Food insecurity:    Worry: Not on file    Inability: Not on file  . Transportation needs:    Medical: Not on file    Non-medical: Not on file  Tobacco Use  . Smoking status: Never Smoker  . Smokeless tobacco: Never Used  Substance and Sexual Activity  . Alcohol use: No    Alcohol/week: 0.0 standard drinks  . Drug use: No  . Sexual activity: Not Currently    Birth control/protection: Post-menopausal  Lifestyle  . Physical activity:    Days per week: Not on file    Minutes per session: Not on file  . Stress: Not on file  Relationships  . Social connections:    Talks on phone: Not on file    Gets together: Not on file    Attends religious service: Not on file    Active member of club or organization: Not on file    Attends meetings of clubs or organizations: Not on file    Relationship status: Not on file  . Intimate partner violence:    Fear of current or ex partner: Not on file    Emotionally abused: Not on file    Physically abused: Not on file    Forced sexual activity: Not on file  Other Topics Concern  . Not on file  Social History Narrative  . Not on file     Review of Systems: General: negative for chills, fever, night sweats or weight changes.  Cardiovascular: negative for chest pain, dyspnea on exertion, edema, orthopnea, palpitations, paroxysmal nocturnal dyspnea or shortness of breath Dermatological: negative for rash Respiratory:  negative for cough or wheezing Urologic: negative for hematuria Abdominal: negative for nausea, vomiting, diarrhea, bright red blood per rectum, melena, or hematemesis Neurologic: negative for visual changes, syncope, or dizziness All other systems reviewed and are otherwise negative except as noted above.   Physical Exam:    Blood pressure 122/78, pulse 69, height 5\' 5"  (1.651 m), weight 158 lb 6.4 oz (71.8 kg), SpO2 97 %.  General appearance: alert, cooperative and no distress Neck: no carotid bruit and no JVD Lungs: clear to auscultation bilaterally Heart: regular rate and rhythm, S1, S2 normal, no murmur, click, rub or gallop Extremities: extremities normal, atraumatic, no cyanosis or edema Pulses: 2+ and symmetric Skin: Skin color, texture, turgor normal. No rashes or lesions Neurologic: Grossly normal  EKG SR w/ PVCs-- personally reviewed   ASSESSMENT AND PLAN:   1. Vfib Cardiac Arrest: 04/02/18. 2/2 NSTEMI. Now s/p PCI. Seen by EP during hospitalization and no indication for PCI as she had no further ventricular arrhthymias post revascularization. She has done well since that time.   2. CAD: h/o CABG and recent NSTEMi 03/2018 2/2 occluded SVG-RCA treated w/ PCI + DES. Doing well w/o any recurrent angina. Continue DAPT w/ ASA and Brilinta+ statin therapy w/ Lipitor (LDL controlled at 44 mgd/L). No BB due to bradycardia.   3. ESRD: on HD Tue, Thur, Sat.   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 Denies dyspnea. Volume controlled at HD.   5. HTN: controlled on current regimen 122/78.  6. DM: management per PCP.   7. Bradycardia: BB discontinued during hospitalization in Sep. EKG with HR in the 70s. Continue to avoid AV nodal blocking agents.   8. Perioperative Evaluation: pt is scheduled for colonoscopy in March. Our office received clearance request to hold Brilitna x 5 days. Indication for colonoscopy: Pt reports that colonoscopy was ordered given recent recurrent C-diff. However she reports that this has cleared up. No issues in the last 2 weeks. She denies any anginal symptoms and would be low risk for procedure, however given stent placement < 6 months ago, I will need to consult her primary cardiologist, Dr. Irish Lack, to see if ok to hold or if this should be delayed until she  has completed 1 year of DAPT, which would be after 03/2019. Clearance pending at this time. Once we receive recommendations from Dr. Irish Lack, we will send clearance update to her gastroenterologist.   Follow-Up w/ Dr. Irish Lack in 6 months.   Brittainy Ladoris Gene, MHS [facility] 08/19/2018 10:39 AM

## 2018-08-19 NOTE — Telephone Encounter (Signed)
Perioperative Evaluation: pt is scheduled for colonoscopy in March. Our office received clearance request to hold Brilitna x 5 days. Indication for colonoscopy: Pt reports that colonoscopy was ordered given recent recurrent C-diff. However she reports that this has cleared up. No issues in the last 2 weeks. She denies any anginal symptoms and would be low risk for procedure, however given stent placement < 6 months ago, I will need to consult her primary cardiologist, Dr. Irish Lack, to see if ok to hold or if this should be delayed until she has completed 1 year of DAPT, which would be after 03/2019. Clearance pending at this time. Once we receive recommendations from Dr. Irish Lack, we will send clearance update to her gastroenterologist.

## 2018-08-20 ENCOUNTER — Telehealth: Payer: Self-pay

## 2018-08-20 DIAGNOSIS — E0869 Diabetes mellitus due to underlying condition with other specified complication: Secondary | ICD-10-CM | POA: Diagnosis not present

## 2018-08-20 DIAGNOSIS — E876 Hypokalemia: Secondary | ICD-10-CM | POA: Diagnosis not present

## 2018-08-20 DIAGNOSIS — D509 Iron deficiency anemia, unspecified: Secondary | ICD-10-CM | POA: Diagnosis not present

## 2018-08-20 DIAGNOSIS — D631 Anemia in chronic kidney disease: Secondary | ICD-10-CM | POA: Diagnosis not present

## 2018-08-20 DIAGNOSIS — N186 End stage renal disease: Secondary | ICD-10-CM | POA: Diagnosis not present

## 2018-08-20 NOTE — Telephone Encounter (Signed)
PV for 2/12 and colonoscopy for 3/2 with Tarri Glenn has been cancelled per cardiologist TE.  Delsa Bern RN PV

## 2018-08-20 NOTE — Telephone Encounter (Signed)
Thank you for the update. Please schedule an office visit with me so that we can discuss and make alternate plans. Thank you.

## 2018-08-20 NOTE — Telephone Encounter (Signed)
Contacted pt re: surgical clearance below. Left a message for her to call the office back.  Will also fwd to [facility] to make them aware.

## 2018-08-20 NOTE — Telephone Encounter (Signed)
Left message on patients voicemail to call office.   

## 2018-08-20 NOTE — Telephone Encounter (Signed)
WOuld at least let her complete 6 months of DAPT before holding 5 days for colonoscopy.  Would likely delay colonoscopy until April.

## 2018-08-20 NOTE — Telephone Encounter (Signed)
   Primary Cardiologist: Larae Grooms, MD  Chart reviewed as part of pre-operative protocol coverage. Patient was contacted 08/20/2018 in reference to pre-operative risk assessment for pending surgery as outlined below.  Eldene Plocher was last seen on 08/19/18 by Ellen Henri, PA.    Due to new STENTS , Zenith Kercheval  Cannot stop asprin or Brilinta until April.  The procedure will need to be postponed.  Pre-op covering staff: -  - Please contact requesting surgeon's office via preferred method (i.e, phone, fax) to inform them of need to post pone procedure.  Cecilie Kicks, NP 08/20/2018, 10:16 AM

## 2018-08-20 NOTE — Telephone Encounter (Signed)
Unable to cancel colonoscopy as it is scheduled at [facility]. Delsa Bern RN PV

## 2018-08-21 NOTE — Telephone Encounter (Signed)
Spoke with patients daughter [name], she is aware of the need to cancel the procedure. Scheduled follow up with Dr. Tarri Glenn to discuss.

## 2018-08-22 DIAGNOSIS — D631 Anemia in chronic kidney disease: Secondary | ICD-10-CM | POA: Diagnosis not present

## 2018-08-22 DIAGNOSIS — E876 Hypokalemia: Secondary | ICD-10-CM | POA: Diagnosis not present

## 2018-08-22 DIAGNOSIS — N186 End stage renal disease: Secondary | ICD-10-CM | POA: Diagnosis not present

## 2018-08-22 DIAGNOSIS — E0869 Diabetes mellitus due to underlying condition with other specified complication: Secondary | ICD-10-CM | POA: Diagnosis not present

## 2018-08-22 DIAGNOSIS — D509 Iron deficiency anemia, unspecified: Secondary | ICD-10-CM | POA: Diagnosis not present

## 2018-08-24 DIAGNOSIS — I25111 Atherosclerotic heart disease of native coronary artery with angina pectoris with documented spasm: Secondary | ICD-10-CM | POA: Diagnosis not present

## 2018-08-24 DIAGNOSIS — Z0000 Encounter for general adult medical examination without abnormal findings: Secondary | ICD-10-CM | POA: Diagnosis not present

## 2018-08-24 DIAGNOSIS — E559 Vitamin D deficiency, unspecified: Secondary | ICD-10-CM | POA: Diagnosis not present

## 2018-08-24 DIAGNOSIS — I10 Essential (primary) hypertension: Secondary | ICD-10-CM | POA: Diagnosis not present

## 2018-08-24 DIAGNOSIS — E1121 Type 2 diabetes mellitus with diabetic nephropathy: Secondary | ICD-10-CM | POA: Diagnosis not present

## 2018-08-24 DIAGNOSIS — Z992 Dependence on renal dialysis: Secondary | ICD-10-CM | POA: Diagnosis not present

## 2018-08-24 DIAGNOSIS — K219 Gastro-esophageal reflux disease without esophagitis: Secondary | ICD-10-CM | POA: Diagnosis not present

## 2018-08-24 DIAGNOSIS — E782 Mixed hyperlipidemia: Secondary | ICD-10-CM | POA: Diagnosis not present

## 2018-08-24 DIAGNOSIS — M109 Gout, unspecified: Secondary | ICD-10-CM | POA: Diagnosis not present

## 2018-08-24 DIAGNOSIS — E11319 Type 2 diabetes mellitus with unspecified diabetic retinopathy without macular edema: Secondary | ICD-10-CM | POA: Diagnosis not present

## 2018-08-24 DIAGNOSIS — E0840 Diabetes mellitus due to underlying condition with diabetic neuropathy, unspecified: Secondary | ICD-10-CM | POA: Diagnosis not present

## 2018-08-25 DIAGNOSIS — E0869 Diabetes mellitus due to underlying condition with other specified complication: Secondary | ICD-10-CM | POA: Diagnosis not present

## 2018-08-25 DIAGNOSIS — N186 End stage renal disease: Secondary | ICD-10-CM | POA: Diagnosis not present

## 2018-08-25 DIAGNOSIS — E876 Hypokalemia: Secondary | ICD-10-CM | POA: Diagnosis not present

## 2018-08-25 DIAGNOSIS — D509 Iron deficiency anemia, unspecified: Secondary | ICD-10-CM | POA: Diagnosis not present

## 2018-08-25 DIAGNOSIS — D631 Anemia in chronic kidney disease: Secondary | ICD-10-CM | POA: Diagnosis not present

## 2018-08-27 DIAGNOSIS — E876 Hypokalemia: Secondary | ICD-10-CM | POA: Diagnosis not present

## 2018-08-27 DIAGNOSIS — D631 Anemia in chronic kidney disease: Secondary | ICD-10-CM | POA: Diagnosis not present

## 2018-08-27 DIAGNOSIS — E0869 Diabetes mellitus due to underlying condition with other specified complication: Secondary | ICD-10-CM | POA: Diagnosis not present

## 2018-08-27 DIAGNOSIS — D509 Iron deficiency anemia, unspecified: Secondary | ICD-10-CM | POA: Diagnosis not present

## 2018-08-27 DIAGNOSIS — N186 End stage renal disease: Secondary | ICD-10-CM | POA: Diagnosis not present

## 2018-08-29 DIAGNOSIS — N186 End stage renal disease: Secondary | ICD-10-CM | POA: Diagnosis not present

## 2018-08-29 DIAGNOSIS — D509 Iron deficiency anemia, unspecified: Secondary | ICD-10-CM | POA: Diagnosis not present

## 2018-08-29 DIAGNOSIS — E0869 Diabetes mellitus due to underlying condition with other specified complication: Secondary | ICD-10-CM | POA: Diagnosis not present

## 2018-08-29 DIAGNOSIS — D631 Anemia in chronic kidney disease: Secondary | ICD-10-CM | POA: Diagnosis not present

## 2018-08-29 DIAGNOSIS — E876 Hypokalemia: Secondary | ICD-10-CM | POA: Diagnosis not present

## 2018-08-31 ENCOUNTER — Encounter: Payer: Self-pay | Admitting: Gastroenterology

## 2018-08-31 ENCOUNTER — Ambulatory Visit (INDEPENDENT_AMBULATORY_CARE_PROVIDER_SITE_OTHER): Payer: Medicare Other | Admitting: Gastroenterology

## 2018-08-31 VITALS — BP 142/52 | HR 82 | Ht 64.0 in | Wt 160.0 lb

## 2018-08-31 DIAGNOSIS — K529 Noninfective gastroenteritis and colitis, unspecified: Secondary | ICD-10-CM | POA: Diagnosis not present

## 2018-08-31 DIAGNOSIS — K649 Unspecified hemorrhoids: Secondary | ICD-10-CM

## 2018-08-31 DIAGNOSIS — R194 Change in bowel habit: Secondary | ICD-10-CM

## 2018-08-31 DIAGNOSIS — Z800 Family history of malignant neoplasm of digestive organs: Secondary | ICD-10-CM

## 2018-08-31 NOTE — Progress Notes (Signed)
Referring Provider: Josetta Huddle, MD Primary Care Physician:  Josetta Huddle, MD   Reason for Consultation:  Stool card +, anemia   IMPRESSION:  Change in bowel habits - diarrhea with fecal soiling Chronic diarrhea having 6 non-bloody bowel movements daily Hemorrhoids with rectal pain and bleeding C Diff 03/3018 treated with 10 days of vancomycin 125 mg QID    - follow-up C Diff testing negative 04/21/18 History of H pylori negative mild antral gastritis 2013 CAD s/p cardiac stents placed 03/2018 on Brilinta and ASA Normal screening colonoscopy 2013 Uses a wheelchair when not at home Father with colon cancer in his 47s Family history of colon polyps (daughter)  Continue current management. Return in April to reassess symptoms to determine if endoscopic evaluation is indicated.    PLAN: Continue daily stool bulking agents Continue Anusol HC suppositories PRN Return to this clinic in April 2020 Will consider colonoscopy at that time if her symptoms have not completely resolved   HPI: [name] is a 78 y.o. retired Psychologist, sport and exercise who returns after her initial consultation for evaluation of a positive stool card, anemia, and iron deficiency.  The interval history is obtained through the patient, her daughter who is a CNA and accompanies her to this appointment, and review of her electronic health record.  She has end-stage renal disease on HD, coronary artery disease status post remote bypass surgery, diabetes requiring insulin and mild protein calorie malnutrition. Recently hospitalized for VF cardiac arrest status post stenting/VDRF.  Hospitalization was complicated by aspiration PNA requiring 5 days of Zosyn. Developed C Diff that was treated with vancomycin 125 mg QID completing 10 days of therapy 04/13/18. C Diff antigen negative 04/21/18. Had hemorrhoid symptoms that  improved with the use of Anusol suppository. Stool test for blood was positive prior to discharge.   Endoscopic  evaluation was recommended at the time of her consultation. Due to cardiac stents placed September 2019, ASA and Brilinta cannot be stopped until April. She returns to discuss ongoing problems with hemorrhoids.   She was turned today to discuss alternatives to endoscopic evaluation.  However, her symptoms have improved.  She has had no further rectal bleeding.  Her diarrhea has resolved.  Given her stool leakage has resolved.  Stools are now normal with a BM TID. In the past, she has had 6 BM daily attributed to IBS by recommendation of Dr. Velora Heckler. She would treat the diarrhea with immodium that would result in constipation and exacerbate her hemorrhoids. She continues to take a daily probiotic and fiber supplements.     Past Medical History:  Diagnosis Date  . Anemia   . Anxiety   . Arthritis   . Atrial fibrillation ([place])   . Blood transfusion   . C. difficile diarrhea   . CAD (coronary artery disease)   . Carotid artery occlusion    Carotid Endartectom,y - left 2009.  Blockage Right being watched by Dr Scot Dock.  . Carotid stenosis   . 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 Complication of anesthesia    pt. states that she was difficult to wake  . Depression   . Diabetes mellitus without complication ([place])   . Diverticulosis   . Dysrhythmia   . General weakness 12/2015  . GERD (gastroesophageal reflux disease)   . Glaucoma   . History of hiatal hernia   . History of kidney stones    passed  . History of pneumonia   . HOH (hard  of hearing)   . Hypertension   . Hypokalemia 12/2015  . Melanoma ([place])    .  top of head- melonoma  . Myocardial infarction ([employer])   . Neuromuscular disorder ([place])    CARPEL TUNNEL  . Pneumonia   . Shortness of breath   . Stroke ([place])    hx of TIA    Past Surgical History:  Procedure Laterality Date  . A/V FISTULAGRAM N/A 07/21/2017   Procedure: A/V FISTULAGRAM - Left Arm;  Surgeon: Angelia Mould, MD;  Location: [place]  [facility];  Service: Cardiovascular;  Laterality: N/A;  . AV FISTULA PLACEMENT Left 05/31/2014   Procedure: Creation of Left Arm arteriovenous brachiocephalic Fistula;  Surgeon: Angelia Mould, MD;  Location: [place];  Service: Vascular;  Laterality: Left;  . AV FISTULA PLACEMENT Left 09/01/2014   Procedure: INSERTION OF ARTERIOVENOUS (AV) GORE-TEX GRAFT LEFT UPPER ARM USING  4-7 MM X 45 CM SRTETCH GORETEX GRAFT;  Surgeon: Angelia Mould, MD;  Location: [place];  Service: Vascular;  Laterality: Left;  . BACK SURGERY    . CARDIAC CATHETERIZATION    . CAROTID ENDARTERECTOMY Left 2009    CEA  . carpel tunnel    . COLONOSCOPY  07/25/2011   Procedure: COLONOSCOPY;  Surgeon: Winfield Cunas., MD;  Location: [facility]OSCOPY;  Service: Endoscopy;  Laterality: N/A;  . CORONARY ARTERY BYPASS GRAFT  07/29/2011   Procedure: CORONARY ARTERY BYPASS GRAFTING (CABG);  Surgeon: Gaye Pollack, MD;  Location: [place];  Service: Open Heart Surgery;  Laterality: N/A;  . CORONARY STENT INTERVENTION N/A 04/08/2018   Procedure: CORONARY STENT INTERVENTION;  Surgeon: Jettie Booze, MD;  Location: [place] [facility];  Service: Cardiovascular;  Laterality: N/A;  . ENDARTERECTOMY Right 04/21/2015   Procedure: ENDARTERECTOMY CAROTID;  Surgeon: Angelia Mould, MD;  Location: [place];  Service: Vascular;  Laterality: Right;  . ESOPHAGOGASTRODUODENOSCOPY  07/25/2011   Procedure: ESOPHAGOGASTRODUODENOSCOPY (EGD);  Surgeon: Winfield Cunas., MD;  Location: [facility] ENDOSCOPY;  Service: Endoscopy;  Laterality: N/A;  . EYE SURGERY    . FISTULOGRAM Left 08/29/2014   Procedure: FISTULOGRAM;  Surgeon: Angelia Mould, MD;  Location[facility];  Service: Cardiovascular;  Laterality: Left;  . KNEE SURGERY Left   . LEFT HEART CATH AND CORS/GRAFTS ANGIOGRAPHY N/A 04/08/2018   Procedure: LEFT HEART CATH AND CORS/GRAFTS ANGIOGRAPHY;  Surgeon: Jettie Booze, MD;  Location: [place] [facility];  Service:  Cardiovascular;  Laterality: N/A;  . LUMBAR LAMINECTOMY/DECOMPRESSION MICRODISCECTOMY N/A 12/01/2015   Procedure: L5-S1 Decompression and Bilateral Microdiscectomy;  Surgeon: Marybelle Killings, MD;  Location: [place];  Service: Orthopedics;  Laterality: N/A;  . NECK SURGERY    . PERIPHERAL VASCULAR BALLOON ANGIOPLASTY  07/21/2017   Procedure: PERIPHERAL VASCULAR BALLOON ANGIOPLASTY;  Surgeon: Angelia Mould, MD;  Location: [place] [facility];  Service: Cardiovascular;;  LT Arm AVF  . TONSILLECTOMY      Current Outpatient Medications  Medication Sig Dispense Refill  . acetaminophen (TYLENOL) 325 MG tablet Take 2 tablets (650 mg total) by mouth every 6 (six) hours as needed for mild pain.    Marland Kitchen allopurinol (ZYLOPRIM) 100 MG tablet Take 100 mg by mouth daily.     Marland Kitchen aspirin EC 81 MG tablet Take 1 tablet (81 mg total) by mouth daily. 90 tablet 3  . atorvastatin (LIPITOR) 40 MG tablet Take 1 tablet (40 mg total) by mouth daily at 6 PM. 30 tablet 1  .  b complex-C-folic acid 1 MG capsule Take 1 capsule by mouth daily.    . brimonidine (ALPHAGAN P) 0.1 % SOLN Place 1 drop into the left eye 2 (two) times daily.    . calcium carbonate (TUMS - DOSED IN MG ELEMENTAL CALCIUM) 500 MG chewable tablet Chew 1 tablet (200 mg of elemental calcium total) by mouth every 6 (six) hours as needed for indigestion or heartburn.    . Cholecalciferol (VITAMIN D) 2000 UNITS tablet Take 2,000 Units by mouth 2 (two) times daily.    . colchicine 0.6 MG tablet Take 0.6 mg by mouth 2 (two) times daily as needed (for gout flares).     . Cyanocobalamin (B-12) 5000 MCG CAPS Take 5,000 mcg by mouth daily.    . hydrALAZINE (APRESOLINE) 25 MG tablet Take 25 mg by mouth daily.    . hydrocortisone (ANUSOL-HC) 2.5 % rectal cream Place 1 application rectally 2 (two) times daily. Apply to a glycerin suppository and insert rectally. 30 g 1  . hydrocortisone (ANUSOL-HC) 25 MG suppository Place 1 suppository (25 mg total) rectally every 12  (twelve) hours. 12 suppository 1  . Hydrocortisone (GERHARDT'S BUTT CREAM) CREA Apply 5 application topically as needed for irritation. 5 each 1  . Insulin Glargine (LANTUS) 100 UNIT/ML Solostar Pen Inject 15 Units into the skin daily. 15 mL 11  . Insulin Pen Needle (PEN NEEDLES) 31G X 8 MM MISC 15 Units by Does not apply route daily. 100 each 9  . nitroGLYCERIN (NITROSTAT) 0.4 MG SL tablet Place 1 tablet (0.4 mg total) under the tongue every 5 (five) minutes as needed for chest pain. 25 tablet 3  . Omega-3 Fatty Acids (FISH OIL) 1200 MG CAPS Take 1,200 mg by mouth 2 (two) times daily.    Marland Kitchen omeprazole (PRILOSEC) 20 MG capsule Take 20 mg by mouth daily as needed (acid reflux).     . polycarbophil (FIBERCON) 625 MG tablet Take 1 tablet (625 mg total) by mouth daily. 30 tablet 0  . saccharomyces boulardii (FLORASTOR) 250 MG capsule Take 1 capsule (250 mg total) by mouth 2 (two) times daily. 60 capsule 0  . sevelamer carbonate (RENVELA) 800 MG tablet Take 1 tablet (800 mg total) by mouth 3 (three) times daily with meals. 90 tablet 0  . ticagrelor (BRILINTA) 90 MG TABS tablet Take 1 tablet (90 mg total) by mouth 2 (two) times daily. 180 tablet 3  . timolol (TIMOPTIC) 0.5 % ophthalmic solution Place 1 drop into the left eye daily.    . travoprost, benzalkonium, (TRAVATAN) 0.004 % ophthalmic solution Place 1 drop into both eyes at bedtime.      No current facility-administered medications for this visit.     Allergies as of 08/31/2018 - Review Complete 08/19/2018  Allergen Reaction Noted  . Neurontin [gabapentin] Other (See Comments) 03/30/2015  . Sulfa antibiotics Other (See Comments) 11/30/2015  . Norvasc [amlodipine] Other (See Comments) 11/30/2015    Family History  Problem Relation Age of Onset  . Diabetes Mother   . Hypertension Mother   . Heart disease Mother        before age 37  . Heart attack Mother   . Stroke Mother   . Hyperlipidemia Father   . Hypertension Father   . Colon  polyps Father   . Lung cancer Father   . Deep vein thrombosis Daughter   . Diabetes Daughter   . Hyperlipidemia Daughter   . Hypertension Daughter   . Heart disease Daughter   .  Peripheral vascular disease Daughter   . Breast cancer Neg Hx     Social History   Socioeconomic History  . Marital status: Married    Spouse name: Not on file  . Number of children: 2  . Years of education: Not on file  . Highest education level: Not on file  Occupational History  . Occupation: retired  Scientific laboratory technician  . Financial resource strain: Not on file  . Food insecurity:    Worry: Not on file    Inability: Not on file  . Transportation needs:    Medical: Not on file    Non-medical: Not on file  Tobacco Use  . Smoking status: Never Smoker  . Smokeless tobacco: Never Used  Substance and Sexual Activity  . Alcohol use: No    Alcohol/week: 0.0 standard drinks  . Drug use: No  . Sexual activity: Not Currently    Birth control/protection: Post-menopausal  Lifestyle  . Physical activity:    Days per week: Not on file    Minutes per session: Not on file  . Stress: Not on file  Relationships  . Social connections:    Talks on phone: Not on file    Gets together: Not on file    Attends religious service: Not on file    Active member of club or organization: Not on file    Attends meetings of clubs or organizations: Not on file    Relationship status: Not on file  . Intimate partner violence:    Fear of current or ex partner: Not on file    Emotionally abused: Not on file    Physically abused: Not on file    Forced sexual activity: Not on file  Other Topics Concern  . Not on file  Social History Narrative  . Not on file    Review of Systems: 12 system ROS is negative except as noted above.  There were no vitals filed for this visit.  Physical Exam: Vital signs were reviewed. General:   Alert, well-nourished, pleasant and cooperative in NAD Head:  Normocephalic and  atraumatic. Eyes:  Sclera clear, no icterus.   Conjunctiva pink. Mouth:  No deformity or lesions.   Neck:  Supple; no thyromegaly. Lungs:  Clear throughout to auscultation.   No wheezes.  Heart:  Regular rate and rhythm; no murmurs Abdomen:  Soft, nontender, normal bowel sounds. No rebound or guarding. No hepatosplenomegaly Rectal:  Deferred  Msk:  Symmetrical without gross deformities. Extremities:  No gross deformities or edema. Neurologic:  Alert and  oriented x4;  grossly nonfocal Skin:  No rash or bruise. Psych:  Alert and cooperative. Normal mood and affect.   [name] L. Tarri Glenn Md, MPH [facility] 08/31/2018, 2:38 PM

## 2018-08-31 NOTE — Patient Instructions (Addendum)
A high fiber diet with plenty of fluids (up to 8 glasses of water daily) is suggested to relieve these symptoms.  Metamucil, Benefiber or Citrucel, 1 tablespoon once or twice daily can be used to keep bowels regular if needed.   Continue Anusol suppositories as needed.

## 2018-09-01 ENCOUNTER — Encounter: Payer: Self-pay | Admitting: Gastroenterology

## 2018-09-01 DIAGNOSIS — E876 Hypokalemia: Secondary | ICD-10-CM | POA: Diagnosis not present

## 2018-09-01 DIAGNOSIS — N186 End stage renal disease: Secondary | ICD-10-CM | POA: Diagnosis not present

## 2018-09-01 DIAGNOSIS — D631 Anemia in chronic kidney disease: Secondary | ICD-10-CM | POA: Diagnosis not present

## 2018-09-01 DIAGNOSIS — D509 Iron deficiency anemia, unspecified: Secondary | ICD-10-CM | POA: Diagnosis not present

## 2018-09-01 DIAGNOSIS — E0869 Diabetes mellitus due to underlying condition with other specified complication: Secondary | ICD-10-CM | POA: Diagnosis not present

## 2018-09-03 DIAGNOSIS — D509 Iron deficiency anemia, unspecified: Secondary | ICD-10-CM | POA: Diagnosis not present

## 2018-09-03 DIAGNOSIS — N186 End stage renal disease: Secondary | ICD-10-CM | POA: Diagnosis not present

## 2018-09-03 DIAGNOSIS — E876 Hypokalemia: Secondary | ICD-10-CM | POA: Diagnosis not present

## 2018-09-03 DIAGNOSIS — E0869 Diabetes mellitus due to underlying condition with other specified complication: Secondary | ICD-10-CM | POA: Diagnosis not present

## 2018-09-03 DIAGNOSIS — D631 Anemia in chronic kidney disease: Secondary | ICD-10-CM | POA: Diagnosis not present

## 2018-09-05 DIAGNOSIS — D631 Anemia in chronic kidney disease: Secondary | ICD-10-CM | POA: Diagnosis not present

## 2018-09-05 DIAGNOSIS — N186 End stage renal disease: Secondary | ICD-10-CM | POA: Diagnosis not present

## 2018-09-05 DIAGNOSIS — D509 Iron deficiency anemia, unspecified: Secondary | ICD-10-CM | POA: Diagnosis not present

## 2018-09-05 DIAGNOSIS — E876 Hypokalemia: Secondary | ICD-10-CM | POA: Diagnosis not present

## 2018-09-05 DIAGNOSIS — E0869 Diabetes mellitus due to underlying condition with other specified complication: Secondary | ICD-10-CM | POA: Diagnosis not present

## 2018-09-08 DIAGNOSIS — N186 End stage renal disease: Secondary | ICD-10-CM | POA: Diagnosis not present

## 2018-09-08 DIAGNOSIS — D509 Iron deficiency anemia, unspecified: Secondary | ICD-10-CM | POA: Diagnosis not present

## 2018-09-08 DIAGNOSIS — E876 Hypokalemia: Secondary | ICD-10-CM | POA: Diagnosis not present

## 2018-09-08 DIAGNOSIS — D631 Anemia in chronic kidney disease: Secondary | ICD-10-CM | POA: Diagnosis not present

## 2018-09-08 DIAGNOSIS — E0869 Diabetes mellitus due to underlying condition with other specified complication: Secondary | ICD-10-CM | POA: Diagnosis not present

## 2018-09-10 DIAGNOSIS — D631 Anemia in chronic kidney disease: Secondary | ICD-10-CM | POA: Diagnosis not present

## 2018-09-10 DIAGNOSIS — E0869 Diabetes mellitus due to underlying condition with other specified complication: Secondary | ICD-10-CM | POA: Diagnosis not present

## 2018-09-10 DIAGNOSIS — E876 Hypokalemia: Secondary | ICD-10-CM | POA: Diagnosis not present

## 2018-09-10 DIAGNOSIS — D509 Iron deficiency anemia, unspecified: Secondary | ICD-10-CM | POA: Diagnosis not present

## 2018-09-10 DIAGNOSIS — N186 End stage renal disease: Secondary | ICD-10-CM | POA: Diagnosis not present

## 2018-09-12 DIAGNOSIS — E876 Hypokalemia: Secondary | ICD-10-CM | POA: Diagnosis not present

## 2018-09-12 DIAGNOSIS — E0869 Diabetes mellitus due to underlying condition with other specified complication: Secondary | ICD-10-CM | POA: Diagnosis not present

## 2018-09-12 DIAGNOSIS — N186 End stage renal disease: Secondary | ICD-10-CM | POA: Diagnosis not present

## 2018-09-12 DIAGNOSIS — D631 Anemia in chronic kidney disease: Secondary | ICD-10-CM | POA: Diagnosis not present

## 2018-09-12 DIAGNOSIS — D509 Iron deficiency anemia, unspecified: Secondary | ICD-10-CM | POA: Diagnosis not present

## 2018-09-13 DIAGNOSIS — E1129 Type 2 diabetes mellitus with other diabetic kidney complication: Secondary | ICD-10-CM | POA: Diagnosis not present

## 2018-09-13 DIAGNOSIS — N186 End stage renal disease: Secondary | ICD-10-CM | POA: Diagnosis not present

## 2018-09-13 DIAGNOSIS — Z992 Dependence on renal dialysis: Secondary | ICD-10-CM | POA: Diagnosis not present

## 2018-09-14 ENCOUNTER — Ambulatory Visit (HOSPITAL_COMMUNITY): Admit: 2018-09-14 | Payer: Medicare Other | Admitting: Gastroenterology

## 2018-09-14 ENCOUNTER — Encounter (HOSPITAL_COMMUNITY): Payer: Self-pay

## 2018-09-14 SURGERY — COLONOSCOPY WITH PROPOFOL
Anesthesia: Monitor Anesthesia Care

## 2018-09-15 DIAGNOSIS — E876 Hypokalemia: Secondary | ICD-10-CM | POA: Diagnosis not present

## 2018-09-15 DIAGNOSIS — N186 End stage renal disease: Secondary | ICD-10-CM | POA: Diagnosis not present

## 2018-09-15 DIAGNOSIS — E0869 Diabetes mellitus due to underlying condition with other specified complication: Secondary | ICD-10-CM | POA: Diagnosis not present

## 2018-09-15 DIAGNOSIS — N2581 Secondary hyperparathyroidism of renal origin: Secondary | ICD-10-CM | POA: Diagnosis not present

## 2018-09-17 DIAGNOSIS — E876 Hypokalemia: Secondary | ICD-10-CM | POA: Diagnosis not present

## 2018-09-17 DIAGNOSIS — E0869 Diabetes mellitus due to underlying condition with other specified complication: Secondary | ICD-10-CM | POA: Diagnosis not present

## 2018-09-17 DIAGNOSIS — N2581 Secondary hyperparathyroidism of renal origin: Secondary | ICD-10-CM | POA: Diagnosis not present

## 2018-09-17 DIAGNOSIS — N186 End stage renal disease: Secondary | ICD-10-CM | POA: Diagnosis not present

## 2018-09-19 DIAGNOSIS — E0869 Diabetes mellitus due to underlying condition with other specified complication: Secondary | ICD-10-CM | POA: Diagnosis not present

## 2018-09-19 DIAGNOSIS — N186 End stage renal disease: Secondary | ICD-10-CM | POA: Diagnosis not present

## 2018-09-19 DIAGNOSIS — N2581 Secondary hyperparathyroidism of renal origin: Secondary | ICD-10-CM | POA: Diagnosis not present

## 2018-09-19 DIAGNOSIS — E876 Hypokalemia: Secondary | ICD-10-CM | POA: Diagnosis not present

## 2018-09-21 DIAGNOSIS — N2581 Secondary hyperparathyroidism of renal origin: Secondary | ICD-10-CM | POA: Insufficient documentation

## 2018-09-22 DIAGNOSIS — N186 End stage renal disease: Secondary | ICD-10-CM | POA: Diagnosis not present

## 2018-09-22 DIAGNOSIS — N2581 Secondary hyperparathyroidism of renal origin: Secondary | ICD-10-CM | POA: Diagnosis not present

## 2018-09-22 DIAGNOSIS — E876 Hypokalemia: Secondary | ICD-10-CM | POA: Diagnosis not present

## 2018-09-22 DIAGNOSIS — E0869 Diabetes mellitus due to underlying condition with other specified complication: Secondary | ICD-10-CM | POA: Diagnosis not present

## 2018-09-24 DIAGNOSIS — E0869 Diabetes mellitus due to underlying condition with other specified complication: Secondary | ICD-10-CM | POA: Diagnosis not present

## 2018-09-24 DIAGNOSIS — N2581 Secondary hyperparathyroidism of renal origin: Secondary | ICD-10-CM | POA: Diagnosis not present

## 2018-09-24 DIAGNOSIS — N186 End stage renal disease: Secondary | ICD-10-CM | POA: Diagnosis not present

## 2018-09-24 DIAGNOSIS — E876 Hypokalemia: Secondary | ICD-10-CM | POA: Diagnosis not present

## 2018-09-26 DIAGNOSIS — N2581 Secondary hyperparathyroidism of renal origin: Secondary | ICD-10-CM | POA: Diagnosis not present

## 2018-09-26 DIAGNOSIS — E0869 Diabetes mellitus due to underlying condition with other specified complication: Secondary | ICD-10-CM | POA: Diagnosis not present

## 2018-09-26 DIAGNOSIS — N186 End stage renal disease: Secondary | ICD-10-CM | POA: Diagnosis not present

## 2018-09-26 DIAGNOSIS — E876 Hypokalemia: Secondary | ICD-10-CM | POA: Diagnosis not present

## 2018-09-29 DIAGNOSIS — N2581 Secondary hyperparathyroidism of renal origin: Secondary | ICD-10-CM | POA: Diagnosis not present

## 2018-09-29 DIAGNOSIS — N186 End stage renal disease: Secondary | ICD-10-CM | POA: Diagnosis not present

## 2018-09-29 DIAGNOSIS — E876 Hypokalemia: Secondary | ICD-10-CM | POA: Diagnosis not present

## 2018-09-29 DIAGNOSIS — E0869 Diabetes mellitus due to underlying condition with other specified complication: Secondary | ICD-10-CM | POA: Diagnosis not present

## 2018-10-01 DIAGNOSIS — N186 End stage renal disease: Secondary | ICD-10-CM | POA: Diagnosis not present

## 2018-10-01 DIAGNOSIS — N2581 Secondary hyperparathyroidism of renal origin: Secondary | ICD-10-CM | POA: Diagnosis not present

## 2018-10-01 DIAGNOSIS — E876 Hypokalemia: Secondary | ICD-10-CM | POA: Diagnosis not present

## 2018-10-01 DIAGNOSIS — E0869 Diabetes mellitus due to underlying condition with other specified complication: Secondary | ICD-10-CM | POA: Diagnosis not present

## 2018-10-03 DIAGNOSIS — E0869 Diabetes mellitus due to underlying condition with other specified complication: Secondary | ICD-10-CM | POA: Diagnosis not present

## 2018-10-03 DIAGNOSIS — E876 Hypokalemia: Secondary | ICD-10-CM | POA: Diagnosis not present

## 2018-10-03 DIAGNOSIS — N186 End stage renal disease: Secondary | ICD-10-CM | POA: Diagnosis not present

## 2018-10-03 DIAGNOSIS — N2581 Secondary hyperparathyroidism of renal origin: Secondary | ICD-10-CM | POA: Diagnosis not present

## 2018-10-06 DIAGNOSIS — E0869 Diabetes mellitus due to underlying condition with other specified complication: Secondary | ICD-10-CM | POA: Diagnosis not present

## 2018-10-06 DIAGNOSIS — N2581 Secondary hyperparathyroidism of renal origin: Secondary | ICD-10-CM | POA: Diagnosis not present

## 2018-10-06 DIAGNOSIS — E876 Hypokalemia: Secondary | ICD-10-CM | POA: Diagnosis not present

## 2018-10-06 DIAGNOSIS — N186 End stage renal disease: Secondary | ICD-10-CM | POA: Diagnosis not present

## 2018-10-08 DIAGNOSIS — E876 Hypokalemia: Secondary | ICD-10-CM | POA: Diagnosis not present

## 2018-10-08 DIAGNOSIS — N2581 Secondary hyperparathyroidism of renal origin: Secondary | ICD-10-CM | POA: Diagnosis not present

## 2018-10-08 DIAGNOSIS — N186 End stage renal disease: Secondary | ICD-10-CM | POA: Diagnosis not present

## 2018-10-08 DIAGNOSIS — E0869 Diabetes mellitus due to underlying condition with other specified complication: Secondary | ICD-10-CM | POA: Diagnosis not present

## 2018-10-10 DIAGNOSIS — E876 Hypokalemia: Secondary | ICD-10-CM | POA: Diagnosis not present

## 2018-10-10 DIAGNOSIS — E0869 Diabetes mellitus due to underlying condition with other specified complication: Secondary | ICD-10-CM | POA: Diagnosis not present

## 2018-10-10 DIAGNOSIS — N186 End stage renal disease: Secondary | ICD-10-CM | POA: Diagnosis not present

## 2018-10-10 DIAGNOSIS — N2581 Secondary hyperparathyroidism of renal origin: Secondary | ICD-10-CM | POA: Diagnosis not present

## 2018-10-13 DIAGNOSIS — N186 End stage renal disease: Secondary | ICD-10-CM | POA: Diagnosis not present

## 2018-10-13 DIAGNOSIS — N2581 Secondary hyperparathyroidism of renal origin: Secondary | ICD-10-CM | POA: Diagnosis not present

## 2018-10-13 DIAGNOSIS — E876 Hypokalemia: Secondary | ICD-10-CM | POA: Diagnosis not present

## 2018-10-13 DIAGNOSIS — E0869 Diabetes mellitus due to underlying condition with other specified complication: Secondary | ICD-10-CM | POA: Diagnosis not present

## 2018-10-14 DIAGNOSIS — E1129 Type 2 diabetes mellitus with other diabetic kidney complication: Secondary | ICD-10-CM | POA: Diagnosis not present

## 2018-10-14 DIAGNOSIS — N186 End stage renal disease: Secondary | ICD-10-CM | POA: Diagnosis not present

## 2018-10-14 DIAGNOSIS — Z992 Dependence on renal dialysis: Secondary | ICD-10-CM | POA: Diagnosis not present

## 2018-10-15 ENCOUNTER — Ambulatory Visit: Payer: Medicare Other | Admitting: Gastroenterology

## 2018-10-15 DIAGNOSIS — D509 Iron deficiency anemia, unspecified: Secondary | ICD-10-CM | POA: Diagnosis not present

## 2018-10-15 DIAGNOSIS — N2581 Secondary hyperparathyroidism of renal origin: Secondary | ICD-10-CM | POA: Diagnosis not present

## 2018-10-15 DIAGNOSIS — N186 End stage renal disease: Secondary | ICD-10-CM | POA: Diagnosis not present

## 2018-10-15 DIAGNOSIS — D631 Anemia in chronic kidney disease: Secondary | ICD-10-CM | POA: Diagnosis not present

## 2018-10-15 DIAGNOSIS — E876 Hypokalemia: Secondary | ICD-10-CM | POA: Diagnosis not present

## 2018-10-17 DIAGNOSIS — E876 Hypokalemia: Secondary | ICD-10-CM | POA: Diagnosis not present

## 2018-10-17 DIAGNOSIS — N2581 Secondary hyperparathyroidism of renal origin: Secondary | ICD-10-CM | POA: Diagnosis not present

## 2018-10-17 DIAGNOSIS — N186 End stage renal disease: Secondary | ICD-10-CM | POA: Diagnosis not present

## 2018-10-17 DIAGNOSIS — D509 Iron deficiency anemia, unspecified: Secondary | ICD-10-CM | POA: Diagnosis not present

## 2018-10-17 DIAGNOSIS — D631 Anemia in chronic kidney disease: Secondary | ICD-10-CM | POA: Diagnosis not present

## 2018-10-20 DIAGNOSIS — E0869 Diabetes mellitus due to underlying condition with other specified complication: Secondary | ICD-10-CM | POA: Diagnosis not present

## 2018-10-20 DIAGNOSIS — D509 Iron deficiency anemia, unspecified: Secondary | ICD-10-CM | POA: Diagnosis not present

## 2018-10-20 DIAGNOSIS — E876 Hypokalemia: Secondary | ICD-10-CM | POA: Diagnosis not present

## 2018-10-20 DIAGNOSIS — N186 End stage renal disease: Secondary | ICD-10-CM | POA: Diagnosis not present

## 2018-10-20 DIAGNOSIS — D631 Anemia in chronic kidney disease: Secondary | ICD-10-CM | POA: Diagnosis not present

## 2018-10-20 DIAGNOSIS — N2581 Secondary hyperparathyroidism of renal origin: Secondary | ICD-10-CM | POA: Diagnosis not present

## 2018-10-22 DIAGNOSIS — D631 Anemia in chronic kidney disease: Secondary | ICD-10-CM | POA: Diagnosis not present

## 2018-10-22 DIAGNOSIS — E876 Hypokalemia: Secondary | ICD-10-CM | POA: Diagnosis not present

## 2018-10-22 DIAGNOSIS — N186 End stage renal disease: Secondary | ICD-10-CM | POA: Diagnosis not present

## 2018-10-22 DIAGNOSIS — D509 Iron deficiency anemia, unspecified: Secondary | ICD-10-CM | POA: Diagnosis not present

## 2018-10-22 DIAGNOSIS — N2581 Secondary hyperparathyroidism of renal origin: Secondary | ICD-10-CM | POA: Diagnosis not present

## 2018-10-24 DIAGNOSIS — D509 Iron deficiency anemia, unspecified: Secondary | ICD-10-CM | POA: Diagnosis not present

## 2018-10-24 DIAGNOSIS — D631 Anemia in chronic kidney disease: Secondary | ICD-10-CM | POA: Diagnosis not present

## 2018-10-24 DIAGNOSIS — N186 End stage renal disease: Secondary | ICD-10-CM | POA: Diagnosis not present

## 2018-10-24 DIAGNOSIS — N2581 Secondary hyperparathyroidism of renal origin: Secondary | ICD-10-CM | POA: Diagnosis not present

## 2018-10-24 DIAGNOSIS — E876 Hypokalemia: Secondary | ICD-10-CM | POA: Diagnosis not present

## 2018-10-27 DIAGNOSIS — D509 Iron deficiency anemia, unspecified: Secondary | ICD-10-CM | POA: Diagnosis not present

## 2018-10-27 DIAGNOSIS — D631 Anemia in chronic kidney disease: Secondary | ICD-10-CM | POA: Diagnosis not present

## 2018-10-27 DIAGNOSIS — N2581 Secondary hyperparathyroidism of renal origin: Secondary | ICD-10-CM | POA: Diagnosis not present

## 2018-10-27 DIAGNOSIS — N186 End stage renal disease: Secondary | ICD-10-CM | POA: Diagnosis not present

## 2018-10-27 DIAGNOSIS — E876 Hypokalemia: Secondary | ICD-10-CM | POA: Diagnosis not present

## 2018-10-29 DIAGNOSIS — D631 Anemia in chronic kidney disease: Secondary | ICD-10-CM | POA: Diagnosis not present

## 2018-10-29 DIAGNOSIS — E876 Hypokalemia: Secondary | ICD-10-CM | POA: Diagnosis not present

## 2018-10-29 DIAGNOSIS — N2581 Secondary hyperparathyroidism of renal origin: Secondary | ICD-10-CM | POA: Diagnosis not present

## 2018-10-29 DIAGNOSIS — N186 End stage renal disease: Secondary | ICD-10-CM | POA: Diagnosis not present

## 2018-10-29 DIAGNOSIS — D509 Iron deficiency anemia, unspecified: Secondary | ICD-10-CM | POA: Diagnosis not present

## 2018-10-31 DIAGNOSIS — D509 Iron deficiency anemia, unspecified: Secondary | ICD-10-CM | POA: Diagnosis not present

## 2018-10-31 DIAGNOSIS — E876 Hypokalemia: Secondary | ICD-10-CM | POA: Diagnosis not present

## 2018-10-31 DIAGNOSIS — N186 End stage renal disease: Secondary | ICD-10-CM | POA: Diagnosis not present

## 2018-10-31 DIAGNOSIS — N2581 Secondary hyperparathyroidism of renal origin: Secondary | ICD-10-CM | POA: Diagnosis not present

## 2018-10-31 DIAGNOSIS — D631 Anemia in chronic kidney disease: Secondary | ICD-10-CM | POA: Diagnosis not present

## 2018-11-03 DIAGNOSIS — N2581 Secondary hyperparathyroidism of renal origin: Secondary | ICD-10-CM | POA: Diagnosis not present

## 2018-11-03 DIAGNOSIS — E876 Hypokalemia: Secondary | ICD-10-CM | POA: Diagnosis not present

## 2018-11-03 DIAGNOSIS — N186 End stage renal disease: Secondary | ICD-10-CM | POA: Diagnosis not present

## 2018-11-03 DIAGNOSIS — D631 Anemia in chronic kidney disease: Secondary | ICD-10-CM | POA: Diagnosis not present

## 2018-11-03 DIAGNOSIS — D509 Iron deficiency anemia, unspecified: Secondary | ICD-10-CM | POA: Diagnosis not present

## 2018-11-05 DIAGNOSIS — E876 Hypokalemia: Secondary | ICD-10-CM | POA: Diagnosis not present

## 2018-11-05 DIAGNOSIS — D631 Anemia in chronic kidney disease: Secondary | ICD-10-CM | POA: Diagnosis not present

## 2018-11-05 DIAGNOSIS — N2581 Secondary hyperparathyroidism of renal origin: Secondary | ICD-10-CM | POA: Diagnosis not present

## 2018-11-05 DIAGNOSIS — N186 End stage renal disease: Secondary | ICD-10-CM | POA: Diagnosis not present

## 2018-11-05 DIAGNOSIS — D509 Iron deficiency anemia, unspecified: Secondary | ICD-10-CM | POA: Diagnosis not present

## 2018-11-07 DIAGNOSIS — D509 Iron deficiency anemia, unspecified: Secondary | ICD-10-CM | POA: Diagnosis not present

## 2018-11-07 DIAGNOSIS — N2581 Secondary hyperparathyroidism of renal origin: Secondary | ICD-10-CM | POA: Diagnosis not present

## 2018-11-07 DIAGNOSIS — E876 Hypokalemia: Secondary | ICD-10-CM | POA: Diagnosis not present

## 2018-11-07 DIAGNOSIS — D631 Anemia in chronic kidney disease: Secondary | ICD-10-CM | POA: Diagnosis not present

## 2018-11-07 DIAGNOSIS — N186 End stage renal disease: Secondary | ICD-10-CM | POA: Diagnosis not present

## 2018-11-10 DIAGNOSIS — N2581 Secondary hyperparathyroidism of renal origin: Secondary | ICD-10-CM | POA: Diagnosis not present

## 2018-11-10 DIAGNOSIS — N186 End stage renal disease: Secondary | ICD-10-CM | POA: Diagnosis not present

## 2018-11-10 DIAGNOSIS — D631 Anemia in chronic kidney disease: Secondary | ICD-10-CM | POA: Diagnosis not present

## 2018-11-10 DIAGNOSIS — D509 Iron deficiency anemia, unspecified: Secondary | ICD-10-CM | POA: Diagnosis not present

## 2018-11-10 DIAGNOSIS — E876 Hypokalemia: Secondary | ICD-10-CM | POA: Diagnosis not present

## 2018-11-12 DIAGNOSIS — N2581 Secondary hyperparathyroidism of renal origin: Secondary | ICD-10-CM | POA: Diagnosis not present

## 2018-11-12 DIAGNOSIS — D631 Anemia in chronic kidney disease: Secondary | ICD-10-CM | POA: Diagnosis not present

## 2018-11-12 DIAGNOSIS — E876 Hypokalemia: Secondary | ICD-10-CM | POA: Diagnosis not present

## 2018-11-12 DIAGNOSIS — D509 Iron deficiency anemia, unspecified: Secondary | ICD-10-CM | POA: Diagnosis not present

## 2018-11-12 DIAGNOSIS — N186 End stage renal disease: Secondary | ICD-10-CM | POA: Diagnosis not present

## 2018-11-13 DIAGNOSIS — Z992 Dependence on renal dialysis: Secondary | ICD-10-CM | POA: Diagnosis not present

## 2018-11-13 DIAGNOSIS — N186 End stage renal disease: Secondary | ICD-10-CM | POA: Diagnosis not present

## 2018-11-13 DIAGNOSIS — E1129 Type 2 diabetes mellitus with other diabetic kidney complication: Secondary | ICD-10-CM | POA: Diagnosis not present

## 2018-11-14 DIAGNOSIS — Z23 Encounter for immunization: Secondary | ICD-10-CM | POA: Diagnosis not present

## 2018-11-14 DIAGNOSIS — N186 End stage renal disease: Secondary | ICD-10-CM | POA: Diagnosis not present

## 2018-11-14 DIAGNOSIS — E876 Hypokalemia: Secondary | ICD-10-CM | POA: Diagnosis not present

## 2018-11-14 DIAGNOSIS — N2581 Secondary hyperparathyroidism of renal origin: Secondary | ICD-10-CM | POA: Diagnosis not present

## 2018-11-14 DIAGNOSIS — D509 Iron deficiency anemia, unspecified: Secondary | ICD-10-CM | POA: Diagnosis not present

## 2018-11-14 DIAGNOSIS — D631 Anemia in chronic kidney disease: Secondary | ICD-10-CM | POA: Diagnosis not present

## 2018-11-17 DIAGNOSIS — Z23 Encounter for immunization: Secondary | ICD-10-CM | POA: Diagnosis not present

## 2018-11-17 DIAGNOSIS — N2581 Secondary hyperparathyroidism of renal origin: Secondary | ICD-10-CM | POA: Diagnosis not present

## 2018-11-17 DIAGNOSIS — D509 Iron deficiency anemia, unspecified: Secondary | ICD-10-CM | POA: Diagnosis not present

## 2018-11-17 DIAGNOSIS — D631 Anemia in chronic kidney disease: Secondary | ICD-10-CM | POA: Diagnosis not present

## 2018-11-17 DIAGNOSIS — E876 Hypokalemia: Secondary | ICD-10-CM | POA: Diagnosis not present

## 2018-11-17 DIAGNOSIS — N186 End stage renal disease: Secondary | ICD-10-CM | POA: Diagnosis not present

## 2018-11-19 DIAGNOSIS — E876 Hypokalemia: Secondary | ICD-10-CM | POA: Diagnosis not present

## 2018-11-19 DIAGNOSIS — N186 End stage renal disease: Secondary | ICD-10-CM | POA: Diagnosis not present

## 2018-11-19 DIAGNOSIS — D509 Iron deficiency anemia, unspecified: Secondary | ICD-10-CM | POA: Diagnosis not present

## 2018-11-19 DIAGNOSIS — N2581 Secondary hyperparathyroidism of renal origin: Secondary | ICD-10-CM | POA: Diagnosis not present

## 2018-11-19 DIAGNOSIS — Z23 Encounter for immunization: Secondary | ICD-10-CM | POA: Diagnosis not present

## 2018-11-19 DIAGNOSIS — D631 Anemia in chronic kidney disease: Secondary | ICD-10-CM | POA: Diagnosis not present

## 2018-11-21 DIAGNOSIS — Z23 Encounter for immunization: Secondary | ICD-10-CM | POA: Diagnosis not present

## 2018-11-21 DIAGNOSIS — E876 Hypokalemia: Secondary | ICD-10-CM | POA: Diagnosis not present

## 2018-11-21 DIAGNOSIS — N2581 Secondary hyperparathyroidism of renal origin: Secondary | ICD-10-CM | POA: Diagnosis not present

## 2018-11-21 DIAGNOSIS — D509 Iron deficiency anemia, unspecified: Secondary | ICD-10-CM | POA: Diagnosis not present

## 2018-11-21 DIAGNOSIS — D631 Anemia in chronic kidney disease: Secondary | ICD-10-CM | POA: Diagnosis not present

## 2018-11-21 DIAGNOSIS — N186 End stage renal disease: Secondary | ICD-10-CM | POA: Diagnosis not present

## 2018-11-24 DIAGNOSIS — N186 End stage renal disease: Secondary | ICD-10-CM | POA: Diagnosis not present

## 2018-11-24 DIAGNOSIS — N2581 Secondary hyperparathyroidism of renal origin: Secondary | ICD-10-CM | POA: Diagnosis not present

## 2018-11-24 DIAGNOSIS — Z23 Encounter for immunization: Secondary | ICD-10-CM | POA: Diagnosis not present

## 2018-11-24 DIAGNOSIS — D631 Anemia in chronic kidney disease: Secondary | ICD-10-CM | POA: Diagnosis not present

## 2018-11-24 DIAGNOSIS — D509 Iron deficiency anemia, unspecified: Secondary | ICD-10-CM | POA: Diagnosis not present

## 2018-11-24 DIAGNOSIS — E876 Hypokalemia: Secondary | ICD-10-CM | POA: Diagnosis not present

## 2018-11-26 DIAGNOSIS — Z23 Encounter for immunization: Secondary | ICD-10-CM | POA: Diagnosis not present

## 2018-11-26 DIAGNOSIS — N186 End stage renal disease: Secondary | ICD-10-CM | POA: Diagnosis not present

## 2018-11-26 DIAGNOSIS — D631 Anemia in chronic kidney disease: Secondary | ICD-10-CM | POA: Diagnosis not present

## 2018-11-26 DIAGNOSIS — D509 Iron deficiency anemia, unspecified: Secondary | ICD-10-CM | POA: Diagnosis not present

## 2018-11-26 DIAGNOSIS — N2581 Secondary hyperparathyroidism of renal origin: Secondary | ICD-10-CM | POA: Diagnosis not present

## 2018-11-26 DIAGNOSIS — E876 Hypokalemia: Secondary | ICD-10-CM | POA: Diagnosis not present

## 2018-11-28 DIAGNOSIS — N186 End stage renal disease: Secondary | ICD-10-CM | POA: Diagnosis not present

## 2018-11-28 DIAGNOSIS — D509 Iron deficiency anemia, unspecified: Secondary | ICD-10-CM | POA: Diagnosis not present

## 2018-11-28 DIAGNOSIS — N2581 Secondary hyperparathyroidism of renal origin: Secondary | ICD-10-CM | POA: Diagnosis not present

## 2018-11-28 DIAGNOSIS — D631 Anemia in chronic kidney disease: Secondary | ICD-10-CM | POA: Diagnosis not present

## 2018-11-28 DIAGNOSIS — E876 Hypokalemia: Secondary | ICD-10-CM | POA: Diagnosis not present

## 2018-11-28 DIAGNOSIS — Z23 Encounter for immunization: Secondary | ICD-10-CM | POA: Diagnosis not present

## 2018-12-01 DIAGNOSIS — D631 Anemia in chronic kidney disease: Secondary | ICD-10-CM | POA: Diagnosis not present

## 2018-12-01 DIAGNOSIS — D509 Iron deficiency anemia, unspecified: Secondary | ICD-10-CM | POA: Diagnosis not present

## 2018-12-01 DIAGNOSIS — E876 Hypokalemia: Secondary | ICD-10-CM | POA: Diagnosis not present

## 2018-12-01 DIAGNOSIS — Z23 Encounter for immunization: Secondary | ICD-10-CM | POA: Diagnosis not present

## 2018-12-01 DIAGNOSIS — N186 End stage renal disease: Secondary | ICD-10-CM | POA: Diagnosis not present

## 2018-12-01 DIAGNOSIS — N2581 Secondary hyperparathyroidism of renal origin: Secondary | ICD-10-CM | POA: Diagnosis not present

## 2018-12-02 IMAGING — DX DG ABDOMEN 1V
1 series · 1 of 1 positions shown · non-contrast
Comparison: 12/04/2015

CLINICAL DATA: OG tube placement

EXAM:
ABDOMEN - 1 VIEW

[abdomen]
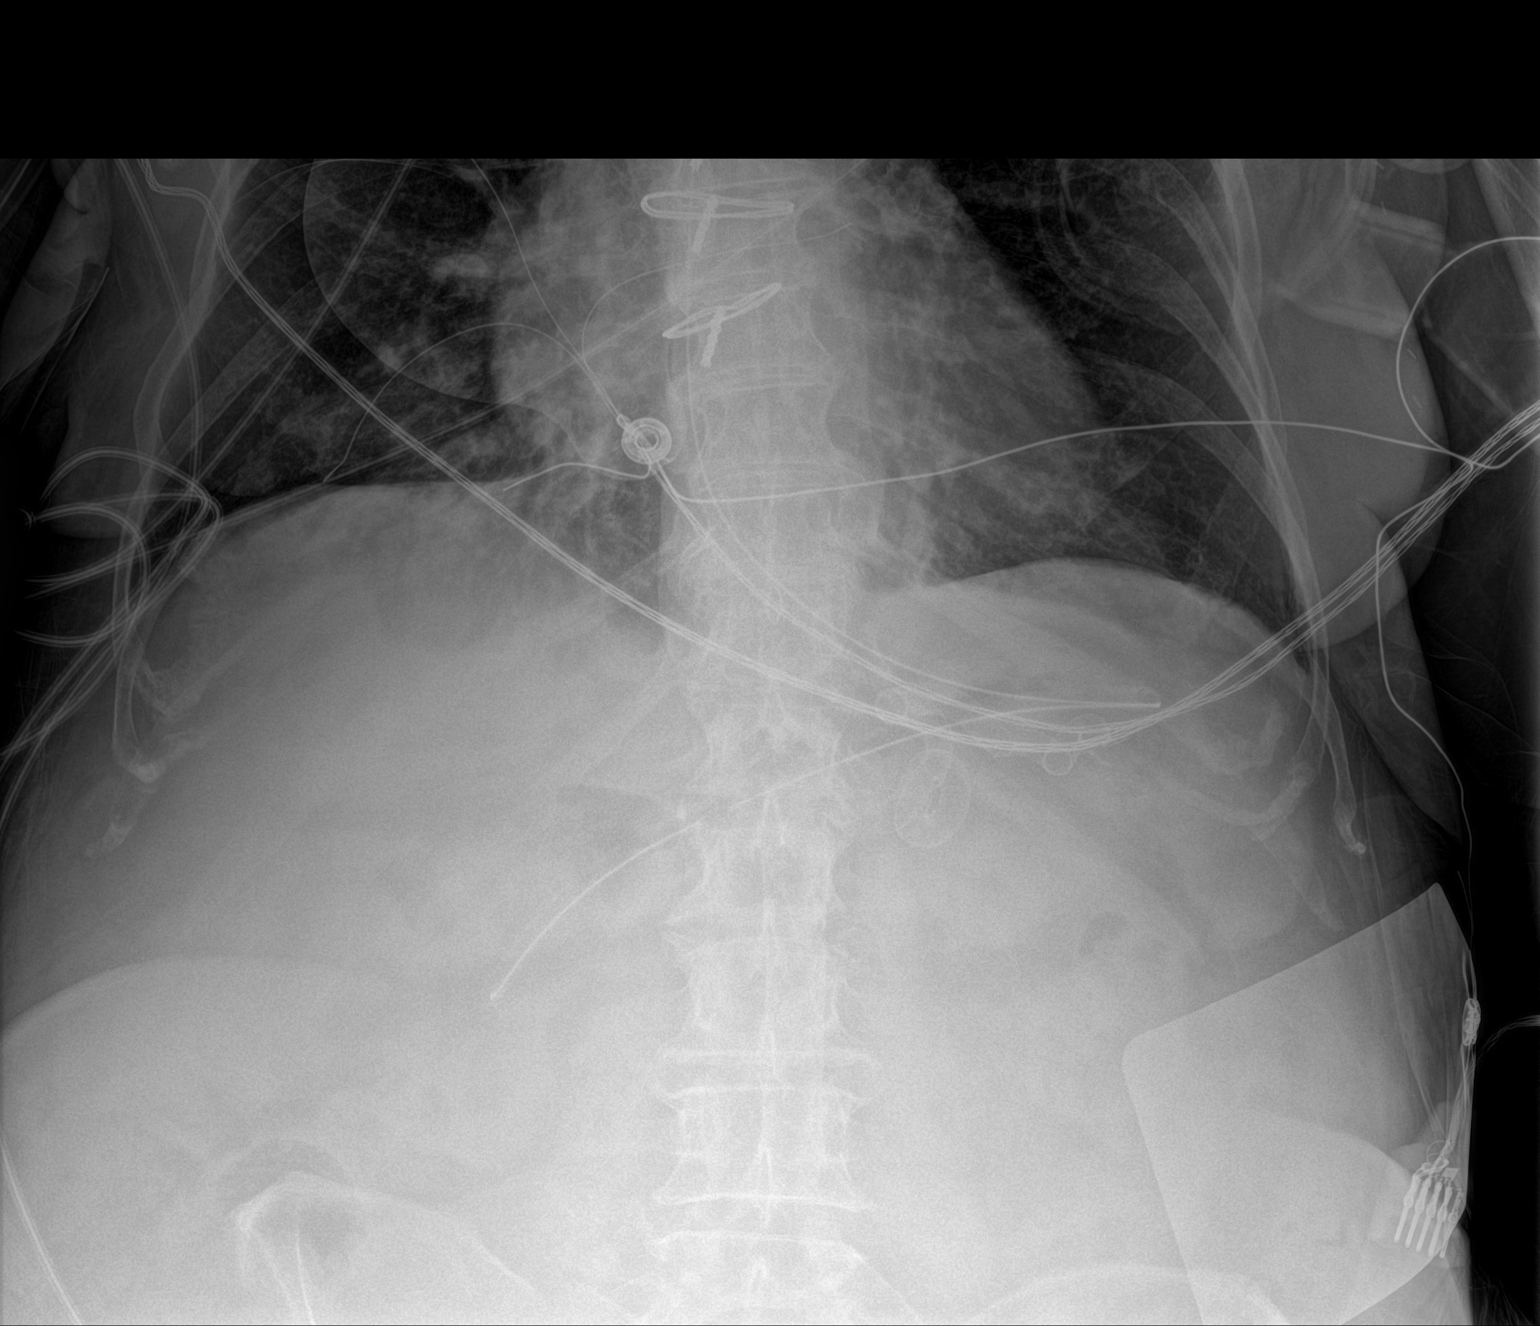

[1 of 1 positions shown; findings below may reference images not displayed]

FINDINGS: OG tube tip is in the distal stomach or proximal duodenum.
Nonobstructive bowel gas pattern noted in the upper abdomen.
IMPRESSION: OG tube tip in the distal stomach or proximal duodenum.

## 2018-12-03 DIAGNOSIS — E876 Hypokalemia: Secondary | ICD-10-CM | POA: Diagnosis not present

## 2018-12-03 DIAGNOSIS — D509 Iron deficiency anemia, unspecified: Secondary | ICD-10-CM | POA: Diagnosis not present

## 2018-12-03 DIAGNOSIS — N186 End stage renal disease: Secondary | ICD-10-CM | POA: Diagnosis not present

## 2018-12-03 DIAGNOSIS — D631 Anemia in chronic kidney disease: Secondary | ICD-10-CM | POA: Diagnosis not present

## 2018-12-03 DIAGNOSIS — N2581 Secondary hyperparathyroidism of renal origin: Secondary | ICD-10-CM | POA: Diagnosis not present

## 2018-12-03 DIAGNOSIS — Z23 Encounter for immunization: Secondary | ICD-10-CM | POA: Diagnosis not present

## 2018-12-05 DIAGNOSIS — D509 Iron deficiency anemia, unspecified: Secondary | ICD-10-CM | POA: Diagnosis not present

## 2018-12-05 DIAGNOSIS — N2581 Secondary hyperparathyroidism of renal origin: Secondary | ICD-10-CM | POA: Diagnosis not present

## 2018-12-05 DIAGNOSIS — Z23 Encounter for immunization: Secondary | ICD-10-CM | POA: Diagnosis not present

## 2018-12-05 DIAGNOSIS — N186 End stage renal disease: Secondary | ICD-10-CM | POA: Diagnosis not present

## 2018-12-05 DIAGNOSIS — E876 Hypokalemia: Secondary | ICD-10-CM | POA: Diagnosis not present

## 2018-12-05 DIAGNOSIS — D631 Anemia in chronic kidney disease: Secondary | ICD-10-CM | POA: Diagnosis not present

## 2018-12-08 DIAGNOSIS — D631 Anemia in chronic kidney disease: Secondary | ICD-10-CM | POA: Diagnosis not present

## 2018-12-08 DIAGNOSIS — D509 Iron deficiency anemia, unspecified: Secondary | ICD-10-CM | POA: Diagnosis not present

## 2018-12-08 DIAGNOSIS — N2581 Secondary hyperparathyroidism of renal origin: Secondary | ICD-10-CM | POA: Diagnosis not present

## 2018-12-08 DIAGNOSIS — E876 Hypokalemia: Secondary | ICD-10-CM | POA: Diagnosis not present

## 2018-12-08 DIAGNOSIS — Z23 Encounter for immunization: Secondary | ICD-10-CM | POA: Diagnosis not present

## 2018-12-08 DIAGNOSIS — N186 End stage renal disease: Secondary | ICD-10-CM | POA: Diagnosis not present

## 2018-12-10 DIAGNOSIS — D509 Iron deficiency anemia, unspecified: Secondary | ICD-10-CM | POA: Diagnosis not present

## 2018-12-10 DIAGNOSIS — E876 Hypokalemia: Secondary | ICD-10-CM | POA: Diagnosis not present

## 2018-12-10 DIAGNOSIS — N2581 Secondary hyperparathyroidism of renal origin: Secondary | ICD-10-CM | POA: Diagnosis not present

## 2018-12-10 DIAGNOSIS — D631 Anemia in chronic kidney disease: Secondary | ICD-10-CM | POA: Diagnosis not present

## 2018-12-10 DIAGNOSIS — Z23 Encounter for immunization: Secondary | ICD-10-CM | POA: Diagnosis not present

## 2018-12-10 DIAGNOSIS — N186 End stage renal disease: Secondary | ICD-10-CM | POA: Diagnosis not present

## 2018-12-12 DIAGNOSIS — Z23 Encounter for immunization: Secondary | ICD-10-CM | POA: Diagnosis not present

## 2018-12-12 DIAGNOSIS — D509 Iron deficiency anemia, unspecified: Secondary | ICD-10-CM | POA: Diagnosis not present

## 2018-12-12 DIAGNOSIS — D631 Anemia in chronic kidney disease: Secondary | ICD-10-CM | POA: Diagnosis not present

## 2018-12-12 DIAGNOSIS — E876 Hypokalemia: Secondary | ICD-10-CM | POA: Diagnosis not present

## 2018-12-12 DIAGNOSIS — N186 End stage renal disease: Secondary | ICD-10-CM | POA: Diagnosis not present

## 2018-12-12 DIAGNOSIS — N2581 Secondary hyperparathyroidism of renal origin: Secondary | ICD-10-CM | POA: Diagnosis not present

## 2018-12-14 DIAGNOSIS — Z992 Dependence on renal dialysis: Secondary | ICD-10-CM | POA: Diagnosis not present

## 2018-12-14 DIAGNOSIS — N186 End stage renal disease: Secondary | ICD-10-CM | POA: Diagnosis not present

## 2018-12-14 DIAGNOSIS — E1129 Type 2 diabetes mellitus with other diabetic kidney complication: Secondary | ICD-10-CM | POA: Diagnosis not present

## 2018-12-15 DIAGNOSIS — E876 Hypokalemia: Secondary | ICD-10-CM | POA: Diagnosis not present

## 2018-12-15 DIAGNOSIS — N186 End stage renal disease: Secondary | ICD-10-CM | POA: Diagnosis not present

## 2018-12-15 DIAGNOSIS — E0869 Diabetes mellitus due to underlying condition with other specified complication: Secondary | ICD-10-CM | POA: Diagnosis not present

## 2018-12-15 DIAGNOSIS — N2581 Secondary hyperparathyroidism of renal origin: Secondary | ICD-10-CM | POA: Diagnosis not present

## 2018-12-17 DIAGNOSIS — E0869 Diabetes mellitus due to underlying condition with other specified complication: Secondary | ICD-10-CM | POA: Diagnosis not present

## 2018-12-17 DIAGNOSIS — E876 Hypokalemia: Secondary | ICD-10-CM | POA: Diagnosis not present

## 2018-12-17 DIAGNOSIS — N2581 Secondary hyperparathyroidism of renal origin: Secondary | ICD-10-CM | POA: Diagnosis not present

## 2018-12-17 DIAGNOSIS — N186 End stage renal disease: Secondary | ICD-10-CM | POA: Diagnosis not present

## 2018-12-19 DIAGNOSIS — N186 End stage renal disease: Secondary | ICD-10-CM | POA: Diagnosis not present

## 2018-12-19 DIAGNOSIS — E0869 Diabetes mellitus due to underlying condition with other specified complication: Secondary | ICD-10-CM | POA: Diagnosis not present

## 2018-12-19 DIAGNOSIS — N2581 Secondary hyperparathyroidism of renal origin: Secondary | ICD-10-CM | POA: Diagnosis not present

## 2018-12-19 DIAGNOSIS — E876 Hypokalemia: Secondary | ICD-10-CM | POA: Diagnosis not present

## 2018-12-22 DIAGNOSIS — N2581 Secondary hyperparathyroidism of renal origin: Secondary | ICD-10-CM | POA: Diagnosis not present

## 2018-12-22 DIAGNOSIS — N186 End stage renal disease: Secondary | ICD-10-CM | POA: Diagnosis not present

## 2018-12-22 DIAGNOSIS — E876 Hypokalemia: Secondary | ICD-10-CM | POA: Diagnosis not present

## 2018-12-22 DIAGNOSIS — E0869 Diabetes mellitus due to underlying condition with other specified complication: Secondary | ICD-10-CM | POA: Diagnosis not present

## 2018-12-24 DIAGNOSIS — E876 Hypokalemia: Secondary | ICD-10-CM | POA: Diagnosis not present

## 2018-12-24 DIAGNOSIS — N186 End stage renal disease: Secondary | ICD-10-CM | POA: Diagnosis not present

## 2018-12-24 DIAGNOSIS — E0869 Diabetes mellitus due to underlying condition with other specified complication: Secondary | ICD-10-CM | POA: Diagnosis not present

## 2018-12-24 DIAGNOSIS — N2581 Secondary hyperparathyroidism of renal origin: Secondary | ICD-10-CM | POA: Diagnosis not present

## 2018-12-26 DIAGNOSIS — E876 Hypokalemia: Secondary | ICD-10-CM | POA: Diagnosis not present

## 2018-12-26 DIAGNOSIS — N186 End stage renal disease: Secondary | ICD-10-CM | POA: Diagnosis not present

## 2018-12-26 DIAGNOSIS — E0869 Diabetes mellitus due to underlying condition with other specified complication: Secondary | ICD-10-CM | POA: Diagnosis not present

## 2018-12-26 DIAGNOSIS — N2581 Secondary hyperparathyroidism of renal origin: Secondary | ICD-10-CM | POA: Diagnosis not present

## 2018-12-29 DIAGNOSIS — E876 Hypokalemia: Secondary | ICD-10-CM | POA: Diagnosis not present

## 2018-12-29 DIAGNOSIS — N186 End stage renal disease: Secondary | ICD-10-CM | POA: Diagnosis not present

## 2018-12-29 DIAGNOSIS — E0869 Diabetes mellitus due to underlying condition with other specified complication: Secondary | ICD-10-CM | POA: Diagnosis not present

## 2018-12-29 DIAGNOSIS — N2581 Secondary hyperparathyroidism of renal origin: Secondary | ICD-10-CM | POA: Diagnosis not present

## 2018-12-31 DIAGNOSIS — N186 End stage renal disease: Secondary | ICD-10-CM | POA: Diagnosis not present

## 2018-12-31 DIAGNOSIS — N2581 Secondary hyperparathyroidism of renal origin: Secondary | ICD-10-CM | POA: Diagnosis not present

## 2018-12-31 DIAGNOSIS — E0869 Diabetes mellitus due to underlying condition with other specified complication: Secondary | ICD-10-CM | POA: Diagnosis not present

## 2018-12-31 DIAGNOSIS — E876 Hypokalemia: Secondary | ICD-10-CM | POA: Diagnosis not present

## 2019-01-02 DIAGNOSIS — E876 Hypokalemia: Secondary | ICD-10-CM | POA: Diagnosis not present

## 2019-01-02 DIAGNOSIS — N186 End stage renal disease: Secondary | ICD-10-CM | POA: Diagnosis not present

## 2019-01-02 DIAGNOSIS — N2581 Secondary hyperparathyroidism of renal origin: Secondary | ICD-10-CM | POA: Diagnosis not present

## 2019-01-02 DIAGNOSIS — E0869 Diabetes mellitus due to underlying condition with other specified complication: Secondary | ICD-10-CM | POA: Diagnosis not present

## 2019-01-05 DIAGNOSIS — E0869 Diabetes mellitus due to underlying condition with other specified complication: Secondary | ICD-10-CM | POA: Diagnosis not present

## 2019-01-05 DIAGNOSIS — N2581 Secondary hyperparathyroidism of renal origin: Secondary | ICD-10-CM | POA: Diagnosis not present

## 2019-01-05 DIAGNOSIS — N186 End stage renal disease: Secondary | ICD-10-CM | POA: Diagnosis not present

## 2019-01-05 DIAGNOSIS — E876 Hypokalemia: Secondary | ICD-10-CM | POA: Diagnosis not present

## 2019-01-07 DIAGNOSIS — N186 End stage renal disease: Secondary | ICD-10-CM | POA: Diagnosis not present

## 2019-01-07 DIAGNOSIS — E876 Hypokalemia: Secondary | ICD-10-CM | POA: Diagnosis not present

## 2019-01-07 DIAGNOSIS — E0869 Diabetes mellitus due to underlying condition with other specified complication: Secondary | ICD-10-CM | POA: Diagnosis not present

## 2019-01-07 DIAGNOSIS — N2581 Secondary hyperparathyroidism of renal origin: Secondary | ICD-10-CM | POA: Diagnosis not present

## 2019-01-09 DIAGNOSIS — N186 End stage renal disease: Secondary | ICD-10-CM | POA: Diagnosis not present

## 2019-01-09 DIAGNOSIS — E0869 Diabetes mellitus due to underlying condition with other specified complication: Secondary | ICD-10-CM | POA: Diagnosis not present

## 2019-01-09 DIAGNOSIS — E876 Hypokalemia: Secondary | ICD-10-CM | POA: Diagnosis not present

## 2019-01-09 DIAGNOSIS — N2581 Secondary hyperparathyroidism of renal origin: Secondary | ICD-10-CM | POA: Diagnosis not present

## 2019-01-12 DIAGNOSIS — E876 Hypokalemia: Secondary | ICD-10-CM | POA: Diagnosis not present

## 2019-01-12 DIAGNOSIS — E0869 Diabetes mellitus due to underlying condition with other specified complication: Secondary | ICD-10-CM | POA: Diagnosis not present

## 2019-01-12 DIAGNOSIS — N186 End stage renal disease: Secondary | ICD-10-CM | POA: Diagnosis not present

## 2019-01-12 DIAGNOSIS — N2581 Secondary hyperparathyroidism of renal origin: Secondary | ICD-10-CM | POA: Diagnosis not present

## 2019-01-13 DIAGNOSIS — Z992 Dependence on renal dialysis: Secondary | ICD-10-CM | POA: Diagnosis not present

## 2019-01-13 DIAGNOSIS — E1129 Type 2 diabetes mellitus with other diabetic kidney complication: Secondary | ICD-10-CM | POA: Diagnosis not present

## 2019-01-13 DIAGNOSIS — N186 End stage renal disease: Secondary | ICD-10-CM | POA: Diagnosis not present

## 2019-01-14 DIAGNOSIS — E876 Hypokalemia: Secondary | ICD-10-CM | POA: Diagnosis not present

## 2019-01-14 DIAGNOSIS — N186 End stage renal disease: Secondary | ICD-10-CM | POA: Diagnosis not present

## 2019-01-14 DIAGNOSIS — D631 Anemia in chronic kidney disease: Secondary | ICD-10-CM | POA: Diagnosis not present

## 2019-01-14 DIAGNOSIS — N2581 Secondary hyperparathyroidism of renal origin: Secondary | ICD-10-CM | POA: Diagnosis not present

## 2019-01-16 DIAGNOSIS — N2581 Secondary hyperparathyroidism of renal origin: Secondary | ICD-10-CM | POA: Diagnosis not present

## 2019-01-16 DIAGNOSIS — N186 End stage renal disease: Secondary | ICD-10-CM | POA: Diagnosis not present

## 2019-01-16 DIAGNOSIS — E876 Hypokalemia: Secondary | ICD-10-CM | POA: Diagnosis not present

## 2019-01-16 DIAGNOSIS — D631 Anemia in chronic kidney disease: Secondary | ICD-10-CM | POA: Diagnosis not present

## 2019-01-19 DIAGNOSIS — E876 Hypokalemia: Secondary | ICD-10-CM | POA: Diagnosis not present

## 2019-01-19 DIAGNOSIS — N186 End stage renal disease: Secondary | ICD-10-CM | POA: Diagnosis not present

## 2019-01-19 DIAGNOSIS — D631 Anemia in chronic kidney disease: Secondary | ICD-10-CM | POA: Diagnosis not present

## 2019-01-19 DIAGNOSIS — N2581 Secondary hyperparathyroidism of renal origin: Secondary | ICD-10-CM | POA: Diagnosis not present

## 2019-01-21 DIAGNOSIS — N2581 Secondary hyperparathyroidism of renal origin: Secondary | ICD-10-CM | POA: Diagnosis not present

## 2019-01-21 DIAGNOSIS — N186 End stage renal disease: Secondary | ICD-10-CM | POA: Diagnosis not present

## 2019-01-21 DIAGNOSIS — E876 Hypokalemia: Secondary | ICD-10-CM | POA: Diagnosis not present

## 2019-01-21 DIAGNOSIS — D631 Anemia in chronic kidney disease: Secondary | ICD-10-CM | POA: Diagnosis not present

## 2019-01-21 DIAGNOSIS — E0869 Diabetes mellitus due to underlying condition with other specified complication: Secondary | ICD-10-CM | POA: Diagnosis not present

## 2019-01-23 DIAGNOSIS — D631 Anemia in chronic kidney disease: Secondary | ICD-10-CM | POA: Diagnosis not present

## 2019-01-23 DIAGNOSIS — N2581 Secondary hyperparathyroidism of renal origin: Secondary | ICD-10-CM | POA: Diagnosis not present

## 2019-01-23 DIAGNOSIS — N186 End stage renal disease: Secondary | ICD-10-CM | POA: Diagnosis not present

## 2019-01-23 DIAGNOSIS — E876 Hypokalemia: Secondary | ICD-10-CM | POA: Diagnosis not present

## 2019-01-26 DIAGNOSIS — N2581 Secondary hyperparathyroidism of renal origin: Secondary | ICD-10-CM | POA: Diagnosis not present

## 2019-01-26 DIAGNOSIS — N186 End stage renal disease: Secondary | ICD-10-CM | POA: Diagnosis not present

## 2019-01-26 DIAGNOSIS — D631 Anemia in chronic kidney disease: Secondary | ICD-10-CM | POA: Diagnosis not present

## 2019-01-26 DIAGNOSIS — E876 Hypokalemia: Secondary | ICD-10-CM | POA: Diagnosis not present

## 2019-01-28 DIAGNOSIS — N2581 Secondary hyperparathyroidism of renal origin: Secondary | ICD-10-CM | POA: Diagnosis not present

## 2019-01-28 DIAGNOSIS — E876 Hypokalemia: Secondary | ICD-10-CM | POA: Diagnosis not present

## 2019-01-28 DIAGNOSIS — D631 Anemia in chronic kidney disease: Secondary | ICD-10-CM | POA: Diagnosis not present

## 2019-01-28 DIAGNOSIS — N186 End stage renal disease: Secondary | ICD-10-CM | POA: Diagnosis not present

## 2019-01-30 DIAGNOSIS — E876 Hypokalemia: Secondary | ICD-10-CM | POA: Diagnosis not present

## 2019-01-30 DIAGNOSIS — N2581 Secondary hyperparathyroidism of renal origin: Secondary | ICD-10-CM | POA: Diagnosis not present

## 2019-01-30 DIAGNOSIS — D631 Anemia in chronic kidney disease: Secondary | ICD-10-CM | POA: Diagnosis not present

## 2019-01-30 DIAGNOSIS — N186 End stage renal disease: Secondary | ICD-10-CM | POA: Diagnosis not present

## 2019-02-02 DIAGNOSIS — N186 End stage renal disease: Secondary | ICD-10-CM | POA: Diagnosis not present

## 2019-02-02 DIAGNOSIS — D631 Anemia in chronic kidney disease: Secondary | ICD-10-CM | POA: Diagnosis not present

## 2019-02-02 DIAGNOSIS — E876 Hypokalemia: Secondary | ICD-10-CM | POA: Diagnosis not present

## 2019-02-02 DIAGNOSIS — N2581 Secondary hyperparathyroidism of renal origin: Secondary | ICD-10-CM | POA: Diagnosis not present

## 2019-02-04 DIAGNOSIS — N186 End stage renal disease: Secondary | ICD-10-CM | POA: Diagnosis not present

## 2019-02-04 DIAGNOSIS — D631 Anemia in chronic kidney disease: Secondary | ICD-10-CM | POA: Diagnosis not present

## 2019-02-04 DIAGNOSIS — E876 Hypokalemia: Secondary | ICD-10-CM | POA: Diagnosis not present

## 2019-02-04 DIAGNOSIS — N2581 Secondary hyperparathyroidism of renal origin: Secondary | ICD-10-CM | POA: Diagnosis not present

## 2019-02-06 DIAGNOSIS — D631 Anemia in chronic kidney disease: Secondary | ICD-10-CM | POA: Diagnosis not present

## 2019-02-06 DIAGNOSIS — E876 Hypokalemia: Secondary | ICD-10-CM | POA: Diagnosis not present

## 2019-02-06 DIAGNOSIS — N2581 Secondary hyperparathyroidism of renal origin: Secondary | ICD-10-CM | POA: Diagnosis not present

## 2019-02-06 DIAGNOSIS — N186 End stage renal disease: Secondary | ICD-10-CM | POA: Diagnosis not present

## 2019-02-09 DIAGNOSIS — D631 Anemia in chronic kidney disease: Secondary | ICD-10-CM | POA: Diagnosis not present

## 2019-02-09 DIAGNOSIS — N2581 Secondary hyperparathyroidism of renal origin: Secondary | ICD-10-CM | POA: Diagnosis not present

## 2019-02-09 DIAGNOSIS — N186 End stage renal disease: Secondary | ICD-10-CM | POA: Diagnosis not present

## 2019-02-09 DIAGNOSIS — E876 Hypokalemia: Secondary | ICD-10-CM | POA: Diagnosis not present

## 2019-02-11 DIAGNOSIS — N2581 Secondary hyperparathyroidism of renal origin: Secondary | ICD-10-CM | POA: Diagnosis not present

## 2019-02-11 DIAGNOSIS — N186 End stage renal disease: Secondary | ICD-10-CM | POA: Diagnosis not present

## 2019-02-11 DIAGNOSIS — D631 Anemia in chronic kidney disease: Secondary | ICD-10-CM | POA: Diagnosis not present

## 2019-02-11 DIAGNOSIS — E876 Hypokalemia: Secondary | ICD-10-CM | POA: Diagnosis not present

## 2019-02-13 DIAGNOSIS — E876 Hypokalemia: Secondary | ICD-10-CM | POA: Diagnosis not present

## 2019-02-13 DIAGNOSIS — N186 End stage renal disease: Secondary | ICD-10-CM | POA: Diagnosis not present

## 2019-02-13 DIAGNOSIS — E0869 Diabetes mellitus due to underlying condition with other specified complication: Secondary | ICD-10-CM | POA: Diagnosis not present

## 2019-02-13 DIAGNOSIS — D509 Iron deficiency anemia, unspecified: Secondary | ICD-10-CM | POA: Diagnosis not present

## 2019-02-13 DIAGNOSIS — Z992 Dependence on renal dialysis: Secondary | ICD-10-CM | POA: Diagnosis not present

## 2019-02-13 DIAGNOSIS — E1129 Type 2 diabetes mellitus with other diabetic kidney complication: Secondary | ICD-10-CM | POA: Diagnosis not present

## 2019-02-13 DIAGNOSIS — D631 Anemia in chronic kidney disease: Secondary | ICD-10-CM | POA: Diagnosis not present

## 2019-02-13 DIAGNOSIS — N2581 Secondary hyperparathyroidism of renal origin: Secondary | ICD-10-CM | POA: Diagnosis not present

## 2019-02-16 DIAGNOSIS — E0869 Diabetes mellitus due to underlying condition with other specified complication: Secondary | ICD-10-CM | POA: Diagnosis not present

## 2019-02-16 DIAGNOSIS — E876 Hypokalemia: Secondary | ICD-10-CM | POA: Diagnosis not present

## 2019-02-16 DIAGNOSIS — D509 Iron deficiency anemia, unspecified: Secondary | ICD-10-CM | POA: Diagnosis not present

## 2019-02-16 DIAGNOSIS — Z992 Dependence on renal dialysis: Secondary | ICD-10-CM | POA: Diagnosis not present

## 2019-02-16 DIAGNOSIS — N186 End stage renal disease: Secondary | ICD-10-CM | POA: Diagnosis not present

## 2019-02-16 DIAGNOSIS — N2581 Secondary hyperparathyroidism of renal origin: Secondary | ICD-10-CM | POA: Diagnosis not present

## 2019-02-18 DIAGNOSIS — E0869 Diabetes mellitus due to underlying condition with other specified complication: Secondary | ICD-10-CM | POA: Diagnosis not present

## 2019-02-18 DIAGNOSIS — E876 Hypokalemia: Secondary | ICD-10-CM | POA: Diagnosis not present

## 2019-02-18 DIAGNOSIS — N186 End stage renal disease: Secondary | ICD-10-CM | POA: Diagnosis not present

## 2019-02-18 DIAGNOSIS — N2581 Secondary hyperparathyroidism of renal origin: Secondary | ICD-10-CM | POA: Diagnosis not present

## 2019-02-18 DIAGNOSIS — D509 Iron deficiency anemia, unspecified: Secondary | ICD-10-CM | POA: Diagnosis not present

## 2019-02-18 DIAGNOSIS — Z992 Dependence on renal dialysis: Secondary | ICD-10-CM | POA: Diagnosis not present

## 2019-02-20 DIAGNOSIS — D509 Iron deficiency anemia, unspecified: Secondary | ICD-10-CM | POA: Diagnosis not present

## 2019-02-20 DIAGNOSIS — E0869 Diabetes mellitus due to underlying condition with other specified complication: Secondary | ICD-10-CM | POA: Diagnosis not present

## 2019-02-20 DIAGNOSIS — E876 Hypokalemia: Secondary | ICD-10-CM | POA: Diagnosis not present

## 2019-02-20 DIAGNOSIS — N2581 Secondary hyperparathyroidism of renal origin: Secondary | ICD-10-CM | POA: Diagnosis not present

## 2019-02-20 DIAGNOSIS — N186 End stage renal disease: Secondary | ICD-10-CM | POA: Diagnosis not present

## 2019-02-20 DIAGNOSIS — Z992 Dependence on renal dialysis: Secondary | ICD-10-CM | POA: Diagnosis not present

## 2019-02-23 DIAGNOSIS — N2581 Secondary hyperparathyroidism of renal origin: Secondary | ICD-10-CM | POA: Diagnosis not present

## 2019-02-23 DIAGNOSIS — D509 Iron deficiency anemia, unspecified: Secondary | ICD-10-CM | POA: Diagnosis not present

## 2019-02-23 DIAGNOSIS — Z992 Dependence on renal dialysis: Secondary | ICD-10-CM | POA: Diagnosis not present

## 2019-02-23 DIAGNOSIS — N186 End stage renal disease: Secondary | ICD-10-CM | POA: Diagnosis not present

## 2019-02-23 DIAGNOSIS — E876 Hypokalemia: Secondary | ICD-10-CM | POA: Diagnosis not present

## 2019-02-23 DIAGNOSIS — E0869 Diabetes mellitus due to underlying condition with other specified complication: Secondary | ICD-10-CM | POA: Diagnosis not present

## 2019-02-25 DIAGNOSIS — N2581 Secondary hyperparathyroidism of renal origin: Secondary | ICD-10-CM | POA: Diagnosis not present

## 2019-02-25 DIAGNOSIS — N186 End stage renal disease: Secondary | ICD-10-CM | POA: Diagnosis not present

## 2019-02-25 DIAGNOSIS — Z992 Dependence on renal dialysis: Secondary | ICD-10-CM | POA: Diagnosis not present

## 2019-02-25 DIAGNOSIS — E876 Hypokalemia: Secondary | ICD-10-CM | POA: Diagnosis not present

## 2019-02-25 DIAGNOSIS — E0869 Diabetes mellitus due to underlying condition with other specified complication: Secondary | ICD-10-CM | POA: Diagnosis not present

## 2019-02-25 DIAGNOSIS — D509 Iron deficiency anemia, unspecified: Secondary | ICD-10-CM | POA: Diagnosis not present

## 2019-02-27 DIAGNOSIS — D509 Iron deficiency anemia, unspecified: Secondary | ICD-10-CM | POA: Diagnosis not present

## 2019-02-27 DIAGNOSIS — E876 Hypokalemia: Secondary | ICD-10-CM | POA: Diagnosis not present

## 2019-02-27 DIAGNOSIS — N2581 Secondary hyperparathyroidism of renal origin: Secondary | ICD-10-CM | POA: Diagnosis not present

## 2019-02-27 DIAGNOSIS — Z992 Dependence on renal dialysis: Secondary | ICD-10-CM | POA: Diagnosis not present

## 2019-02-27 DIAGNOSIS — N186 End stage renal disease: Secondary | ICD-10-CM | POA: Diagnosis not present

## 2019-02-27 DIAGNOSIS — E0869 Diabetes mellitus due to underlying condition with other specified complication: Secondary | ICD-10-CM | POA: Diagnosis not present

## 2019-03-02 DIAGNOSIS — E0869 Diabetes mellitus due to underlying condition with other specified complication: Secondary | ICD-10-CM | POA: Diagnosis not present

## 2019-03-02 DIAGNOSIS — D509 Iron deficiency anemia, unspecified: Secondary | ICD-10-CM | POA: Diagnosis not present

## 2019-03-02 DIAGNOSIS — E876 Hypokalemia: Secondary | ICD-10-CM | POA: Diagnosis not present

## 2019-03-02 DIAGNOSIS — N2581 Secondary hyperparathyroidism of renal origin: Secondary | ICD-10-CM | POA: Diagnosis not present

## 2019-03-02 DIAGNOSIS — Z992 Dependence on renal dialysis: Secondary | ICD-10-CM | POA: Diagnosis not present

## 2019-03-02 DIAGNOSIS — N186 End stage renal disease: Secondary | ICD-10-CM | POA: Diagnosis not present

## 2019-03-04 DIAGNOSIS — Z992 Dependence on renal dialysis: Secondary | ICD-10-CM | POA: Diagnosis not present

## 2019-03-04 DIAGNOSIS — N2581 Secondary hyperparathyroidism of renal origin: Secondary | ICD-10-CM | POA: Diagnosis not present

## 2019-03-04 DIAGNOSIS — D509 Iron deficiency anemia, unspecified: Secondary | ICD-10-CM | POA: Diagnosis not present

## 2019-03-04 DIAGNOSIS — N186 End stage renal disease: Secondary | ICD-10-CM | POA: Diagnosis not present

## 2019-03-04 DIAGNOSIS — E0869 Diabetes mellitus due to underlying condition with other specified complication: Secondary | ICD-10-CM | POA: Diagnosis not present

## 2019-03-04 DIAGNOSIS — E876 Hypokalemia: Secondary | ICD-10-CM | POA: Diagnosis not present

## 2019-03-05 ENCOUNTER — Other Ambulatory Visit: Payer: Self-pay | Admitting: Cardiology

## 2019-03-06 DIAGNOSIS — Z992 Dependence on renal dialysis: Secondary | ICD-10-CM | POA: Diagnosis not present

## 2019-03-06 DIAGNOSIS — E876 Hypokalemia: Secondary | ICD-10-CM | POA: Diagnosis not present

## 2019-03-06 DIAGNOSIS — N186 End stage renal disease: Secondary | ICD-10-CM | POA: Diagnosis not present

## 2019-03-06 DIAGNOSIS — N2581 Secondary hyperparathyroidism of renal origin: Secondary | ICD-10-CM | POA: Diagnosis not present

## 2019-03-06 DIAGNOSIS — D509 Iron deficiency anemia, unspecified: Secondary | ICD-10-CM | POA: Diagnosis not present

## 2019-03-06 DIAGNOSIS — E0869 Diabetes mellitus due to underlying condition with other specified complication: Secondary | ICD-10-CM | POA: Diagnosis not present

## 2019-03-09 DIAGNOSIS — N186 End stage renal disease: Secondary | ICD-10-CM | POA: Diagnosis not present

## 2019-03-09 DIAGNOSIS — N2581 Secondary hyperparathyroidism of renal origin: Secondary | ICD-10-CM | POA: Diagnosis not present

## 2019-03-09 DIAGNOSIS — E876 Hypokalemia: Secondary | ICD-10-CM | POA: Diagnosis not present

## 2019-03-09 DIAGNOSIS — Z992 Dependence on renal dialysis: Secondary | ICD-10-CM | POA: Diagnosis not present

## 2019-03-09 DIAGNOSIS — E0869 Diabetes mellitus due to underlying condition with other specified complication: Secondary | ICD-10-CM | POA: Diagnosis not present

## 2019-03-09 DIAGNOSIS — D509 Iron deficiency anemia, unspecified: Secondary | ICD-10-CM | POA: Diagnosis not present

## 2019-03-11 DIAGNOSIS — E876 Hypokalemia: Secondary | ICD-10-CM | POA: Diagnosis not present

## 2019-03-11 DIAGNOSIS — D509 Iron deficiency anemia, unspecified: Secondary | ICD-10-CM | POA: Diagnosis not present

## 2019-03-11 DIAGNOSIS — N2581 Secondary hyperparathyroidism of renal origin: Secondary | ICD-10-CM | POA: Diagnosis not present

## 2019-03-11 DIAGNOSIS — Z992 Dependence on renal dialysis: Secondary | ICD-10-CM | POA: Diagnosis not present

## 2019-03-11 DIAGNOSIS — E0869 Diabetes mellitus due to underlying condition with other specified complication: Secondary | ICD-10-CM | POA: Diagnosis not present

## 2019-03-11 DIAGNOSIS — N186 End stage renal disease: Secondary | ICD-10-CM | POA: Diagnosis not present

## 2019-03-12 DIAGNOSIS — E0840 Diabetes mellitus due to underlying condition with diabetic neuropathy, unspecified: Secondary | ICD-10-CM | POA: Diagnosis not present

## 2019-03-12 DIAGNOSIS — N184 Chronic kidney disease, stage 4 (severe): Secondary | ICD-10-CM | POA: Diagnosis not present

## 2019-03-12 DIAGNOSIS — I10 Essential (primary) hypertension: Secondary | ICD-10-CM | POA: Diagnosis not present

## 2019-03-12 DIAGNOSIS — J45991 Cough variant asthma: Secondary | ICD-10-CM | POA: Diagnosis not present

## 2019-03-12 DIAGNOSIS — M179 Osteoarthritis of knee, unspecified: Secondary | ICD-10-CM | POA: Diagnosis not present

## 2019-03-12 DIAGNOSIS — E1121 Type 2 diabetes mellitus with diabetic nephropathy: Secondary | ICD-10-CM | POA: Diagnosis not present

## 2019-03-12 DIAGNOSIS — E782 Mixed hyperlipidemia: Secondary | ICD-10-CM | POA: Diagnosis not present

## 2019-03-12 DIAGNOSIS — I2510 Atherosclerotic heart disease of native coronary artery without angina pectoris: Secondary | ICD-10-CM | POA: Diagnosis not present

## 2019-03-12 DIAGNOSIS — E1165 Type 2 diabetes mellitus with hyperglycemia: Secondary | ICD-10-CM | POA: Diagnosis not present

## 2019-03-12 DIAGNOSIS — E11319 Type 2 diabetes mellitus with unspecified diabetic retinopathy without macular edema: Secondary | ICD-10-CM | POA: Diagnosis not present

## 2019-03-12 DIAGNOSIS — E781 Pure hyperglyceridemia: Secondary | ICD-10-CM | POA: Diagnosis not present

## 2019-03-12 DIAGNOSIS — E1140 Type 2 diabetes mellitus with diabetic neuropathy, unspecified: Secondary | ICD-10-CM | POA: Diagnosis not present

## 2019-03-13 DIAGNOSIS — E876 Hypokalemia: Secondary | ICD-10-CM | POA: Diagnosis not present

## 2019-03-13 DIAGNOSIS — N2581 Secondary hyperparathyroidism of renal origin: Secondary | ICD-10-CM | POA: Diagnosis not present

## 2019-03-13 DIAGNOSIS — N186 End stage renal disease: Secondary | ICD-10-CM | POA: Diagnosis not present

## 2019-03-13 DIAGNOSIS — E0869 Diabetes mellitus due to underlying condition with other specified complication: Secondary | ICD-10-CM | POA: Diagnosis not present

## 2019-03-13 DIAGNOSIS — D509 Iron deficiency anemia, unspecified: Secondary | ICD-10-CM | POA: Diagnosis not present

## 2019-03-13 DIAGNOSIS — Z992 Dependence on renal dialysis: Secondary | ICD-10-CM | POA: Diagnosis not present

## 2019-03-15 DIAGNOSIS — Z961 Presence of intraocular lens: Secondary | ICD-10-CM | POA: Diagnosis not present

## 2019-03-15 DIAGNOSIS — Z794 Long term (current) use of insulin: Secondary | ICD-10-CM | POA: Diagnosis not present

## 2019-03-15 DIAGNOSIS — H524 Presbyopia: Secondary | ICD-10-CM | POA: Diagnosis not present

## 2019-03-15 DIAGNOSIS — H401131 Primary open-angle glaucoma, bilateral, mild stage: Secondary | ICD-10-CM | POA: Diagnosis not present

## 2019-03-15 DIAGNOSIS — E119 Type 2 diabetes mellitus without complications: Secondary | ICD-10-CM | POA: Diagnosis not present

## 2019-03-16 DIAGNOSIS — D509 Iron deficiency anemia, unspecified: Secondary | ICD-10-CM | POA: Diagnosis not present

## 2019-03-16 DIAGNOSIS — E1129 Type 2 diabetes mellitus with other diabetic kidney complication: Secondary | ICD-10-CM | POA: Diagnosis not present

## 2019-03-16 DIAGNOSIS — Z992 Dependence on renal dialysis: Secondary | ICD-10-CM | POA: Diagnosis not present

## 2019-03-16 DIAGNOSIS — N186 End stage renal disease: Secondary | ICD-10-CM | POA: Diagnosis not present

## 2019-03-16 DIAGNOSIS — E0869 Diabetes mellitus due to underlying condition with other specified complication: Secondary | ICD-10-CM | POA: Diagnosis not present

## 2019-03-16 DIAGNOSIS — D631 Anemia in chronic kidney disease: Secondary | ICD-10-CM | POA: Diagnosis not present

## 2019-03-16 DIAGNOSIS — N2581 Secondary hyperparathyroidism of renal origin: Secondary | ICD-10-CM | POA: Diagnosis not present

## 2019-03-16 DIAGNOSIS — Z23 Encounter for immunization: Secondary | ICD-10-CM | POA: Diagnosis not present

## 2019-03-16 DIAGNOSIS — E876 Hypokalemia: Secondary | ICD-10-CM | POA: Diagnosis not present

## 2019-03-18 DIAGNOSIS — D509 Iron deficiency anemia, unspecified: Secondary | ICD-10-CM | POA: Diagnosis not present

## 2019-03-18 DIAGNOSIS — N2581 Secondary hyperparathyroidism of renal origin: Secondary | ICD-10-CM | POA: Diagnosis not present

## 2019-03-18 DIAGNOSIS — D631 Anemia in chronic kidney disease: Secondary | ICD-10-CM | POA: Diagnosis not present

## 2019-03-18 DIAGNOSIS — N186 End stage renal disease: Secondary | ICD-10-CM | POA: Diagnosis not present

## 2019-03-18 DIAGNOSIS — Z992 Dependence on renal dialysis: Secondary | ICD-10-CM | POA: Diagnosis not present

## 2019-03-18 DIAGNOSIS — E876 Hypokalemia: Secondary | ICD-10-CM | POA: Diagnosis not present

## 2019-03-20 DIAGNOSIS — N186 End stage renal disease: Secondary | ICD-10-CM | POA: Diagnosis not present

## 2019-03-20 DIAGNOSIS — Z992 Dependence on renal dialysis: Secondary | ICD-10-CM | POA: Diagnosis not present

## 2019-03-20 DIAGNOSIS — E876 Hypokalemia: Secondary | ICD-10-CM | POA: Diagnosis not present

## 2019-03-20 DIAGNOSIS — D631 Anemia in chronic kidney disease: Secondary | ICD-10-CM | POA: Diagnosis not present

## 2019-03-20 DIAGNOSIS — D509 Iron deficiency anemia, unspecified: Secondary | ICD-10-CM | POA: Diagnosis not present

## 2019-03-20 DIAGNOSIS — N2581 Secondary hyperparathyroidism of renal origin: Secondary | ICD-10-CM | POA: Diagnosis not present

## 2019-03-23 DIAGNOSIS — N186 End stage renal disease: Secondary | ICD-10-CM | POA: Diagnosis not present

## 2019-03-23 DIAGNOSIS — N2581 Secondary hyperparathyroidism of renal origin: Secondary | ICD-10-CM | POA: Diagnosis not present

## 2019-03-23 DIAGNOSIS — E876 Hypokalemia: Secondary | ICD-10-CM | POA: Diagnosis not present

## 2019-03-23 DIAGNOSIS — D509 Iron deficiency anemia, unspecified: Secondary | ICD-10-CM | POA: Diagnosis not present

## 2019-03-23 DIAGNOSIS — D631 Anemia in chronic kidney disease: Secondary | ICD-10-CM | POA: Diagnosis not present

## 2019-03-23 DIAGNOSIS — Z992 Dependence on renal dialysis: Secondary | ICD-10-CM | POA: Diagnosis not present

## 2019-03-25 DIAGNOSIS — N186 End stage renal disease: Secondary | ICD-10-CM | POA: Diagnosis not present

## 2019-03-25 DIAGNOSIS — E876 Hypokalemia: Secondary | ICD-10-CM | POA: Diagnosis not present

## 2019-03-25 DIAGNOSIS — D631 Anemia in chronic kidney disease: Secondary | ICD-10-CM | POA: Diagnosis not present

## 2019-03-25 DIAGNOSIS — N2581 Secondary hyperparathyroidism of renal origin: Secondary | ICD-10-CM | POA: Diagnosis not present

## 2019-03-25 DIAGNOSIS — Z992 Dependence on renal dialysis: Secondary | ICD-10-CM | POA: Diagnosis not present

## 2019-03-25 DIAGNOSIS — D509 Iron deficiency anemia, unspecified: Secondary | ICD-10-CM | POA: Diagnosis not present

## 2019-03-27 DIAGNOSIS — N186 End stage renal disease: Secondary | ICD-10-CM | POA: Diagnosis not present

## 2019-03-27 DIAGNOSIS — D509 Iron deficiency anemia, unspecified: Secondary | ICD-10-CM | POA: Diagnosis not present

## 2019-03-27 DIAGNOSIS — Z992 Dependence on renal dialysis: Secondary | ICD-10-CM | POA: Diagnosis not present

## 2019-03-27 DIAGNOSIS — N2581 Secondary hyperparathyroidism of renal origin: Secondary | ICD-10-CM | POA: Diagnosis not present

## 2019-03-27 DIAGNOSIS — D631 Anemia in chronic kidney disease: Secondary | ICD-10-CM | POA: Diagnosis not present

## 2019-03-27 DIAGNOSIS — E876 Hypokalemia: Secondary | ICD-10-CM | POA: Diagnosis not present

## 2019-04-01 DIAGNOSIS — N2581 Secondary hyperparathyroidism of renal origin: Secondary | ICD-10-CM | POA: Diagnosis not present

## 2019-04-01 DIAGNOSIS — Z992 Dependence on renal dialysis: Secondary | ICD-10-CM | POA: Diagnosis not present

## 2019-04-01 DIAGNOSIS — D631 Anemia in chronic kidney disease: Secondary | ICD-10-CM | POA: Diagnosis not present

## 2019-04-01 DIAGNOSIS — N186 End stage renal disease: Secondary | ICD-10-CM | POA: Diagnosis not present

## 2019-04-01 DIAGNOSIS — E876 Hypokalemia: Secondary | ICD-10-CM | POA: Diagnosis not present

## 2019-04-01 DIAGNOSIS — D509 Iron deficiency anemia, unspecified: Secondary | ICD-10-CM | POA: Diagnosis not present

## 2019-04-02 DIAGNOSIS — N186 End stage renal disease: Secondary | ICD-10-CM | POA: Diagnosis not present

## 2019-04-02 DIAGNOSIS — I871 Compression of vein: Secondary | ICD-10-CM | POA: Diagnosis not present

## 2019-04-02 DIAGNOSIS — Z992 Dependence on renal dialysis: Secondary | ICD-10-CM | POA: Diagnosis not present

## 2019-04-02 DIAGNOSIS — T82858A Stenosis of vascular prosthetic devices, implants and grafts, initial encounter: Secondary | ICD-10-CM | POA: Diagnosis not present

## 2019-04-03 DIAGNOSIS — E876 Hypokalemia: Secondary | ICD-10-CM | POA: Diagnosis not present

## 2019-04-03 DIAGNOSIS — N186 End stage renal disease: Secondary | ICD-10-CM | POA: Diagnosis not present

## 2019-04-03 DIAGNOSIS — D509 Iron deficiency anemia, unspecified: Secondary | ICD-10-CM | POA: Diagnosis not present

## 2019-04-03 DIAGNOSIS — D631 Anemia in chronic kidney disease: Secondary | ICD-10-CM | POA: Diagnosis not present

## 2019-04-03 DIAGNOSIS — N2581 Secondary hyperparathyroidism of renal origin: Secondary | ICD-10-CM | POA: Diagnosis not present

## 2019-04-03 DIAGNOSIS — Z992 Dependence on renal dialysis: Secondary | ICD-10-CM | POA: Diagnosis not present

## 2019-04-06 DIAGNOSIS — N186 End stage renal disease: Secondary | ICD-10-CM | POA: Diagnosis not present

## 2019-04-06 DIAGNOSIS — D509 Iron deficiency anemia, unspecified: Secondary | ICD-10-CM | POA: Diagnosis not present

## 2019-04-06 DIAGNOSIS — N2581 Secondary hyperparathyroidism of renal origin: Secondary | ICD-10-CM | POA: Diagnosis not present

## 2019-04-06 DIAGNOSIS — E876 Hypokalemia: Secondary | ICD-10-CM | POA: Diagnosis not present

## 2019-04-06 DIAGNOSIS — Z992 Dependence on renal dialysis: Secondary | ICD-10-CM | POA: Diagnosis not present

## 2019-04-06 DIAGNOSIS — D631 Anemia in chronic kidney disease: Secondary | ICD-10-CM | POA: Diagnosis not present

## 2019-04-08 DIAGNOSIS — Z992 Dependence on renal dialysis: Secondary | ICD-10-CM | POA: Diagnosis not present

## 2019-04-08 DIAGNOSIS — E876 Hypokalemia: Secondary | ICD-10-CM | POA: Diagnosis not present

## 2019-04-08 DIAGNOSIS — N2581 Secondary hyperparathyroidism of renal origin: Secondary | ICD-10-CM | POA: Diagnosis not present

## 2019-04-08 DIAGNOSIS — N186 End stage renal disease: Secondary | ICD-10-CM | POA: Diagnosis not present

## 2019-04-08 DIAGNOSIS — D631 Anemia in chronic kidney disease: Secondary | ICD-10-CM | POA: Diagnosis not present

## 2019-04-08 DIAGNOSIS — D509 Iron deficiency anemia, unspecified: Secondary | ICD-10-CM | POA: Diagnosis not present

## 2019-04-10 DIAGNOSIS — D631 Anemia in chronic kidney disease: Secondary | ICD-10-CM | POA: Diagnosis not present

## 2019-04-10 DIAGNOSIS — N186 End stage renal disease: Secondary | ICD-10-CM | POA: Diagnosis not present

## 2019-04-10 DIAGNOSIS — E876 Hypokalemia: Secondary | ICD-10-CM | POA: Diagnosis not present

## 2019-04-10 DIAGNOSIS — N2581 Secondary hyperparathyroidism of renal origin: Secondary | ICD-10-CM | POA: Diagnosis not present

## 2019-04-10 DIAGNOSIS — D509 Iron deficiency anemia, unspecified: Secondary | ICD-10-CM | POA: Diagnosis not present

## 2019-04-10 DIAGNOSIS — Z992 Dependence on renal dialysis: Secondary | ICD-10-CM | POA: Diagnosis not present

## 2019-04-13 DIAGNOSIS — Z992 Dependence on renal dialysis: Secondary | ICD-10-CM | POA: Diagnosis not present

## 2019-04-13 DIAGNOSIS — D509 Iron deficiency anemia, unspecified: Secondary | ICD-10-CM | POA: Diagnosis not present

## 2019-04-13 DIAGNOSIS — N186 End stage renal disease: Secondary | ICD-10-CM | POA: Diagnosis not present

## 2019-04-13 DIAGNOSIS — D631 Anemia in chronic kidney disease: Secondary | ICD-10-CM | POA: Diagnosis not present

## 2019-04-13 DIAGNOSIS — E876 Hypokalemia: Secondary | ICD-10-CM | POA: Diagnosis not present

## 2019-04-13 DIAGNOSIS — N2581 Secondary hyperparathyroidism of renal origin: Secondary | ICD-10-CM | POA: Diagnosis not present

## 2019-04-15 DIAGNOSIS — N2581 Secondary hyperparathyroidism of renal origin: Secondary | ICD-10-CM | POA: Diagnosis not present

## 2019-04-15 DIAGNOSIS — D509 Iron deficiency anemia, unspecified: Secondary | ICD-10-CM | POA: Diagnosis not present

## 2019-04-15 DIAGNOSIS — E0869 Diabetes mellitus due to underlying condition with other specified complication: Secondary | ICD-10-CM | POA: Diagnosis not present

## 2019-04-15 DIAGNOSIS — Z992 Dependence on renal dialysis: Secondary | ICD-10-CM | POA: Diagnosis not present

## 2019-04-15 DIAGNOSIS — N186 End stage renal disease: Secondary | ICD-10-CM | POA: Diagnosis not present

## 2019-04-15 DIAGNOSIS — E876 Hypokalemia: Secondary | ICD-10-CM | POA: Diagnosis not present

## 2019-04-15 DIAGNOSIS — D631 Anemia in chronic kidney disease: Secondary | ICD-10-CM | POA: Diagnosis not present

## 2019-04-15 DIAGNOSIS — E1129 Type 2 diabetes mellitus with other diabetic kidney complication: Secondary | ICD-10-CM | POA: Diagnosis not present

## 2019-04-17 DIAGNOSIS — Z992 Dependence on renal dialysis: Secondary | ICD-10-CM | POA: Diagnosis not present

## 2019-04-17 DIAGNOSIS — E0869 Diabetes mellitus due to underlying condition with other specified complication: Secondary | ICD-10-CM | POA: Diagnosis not present

## 2019-04-17 DIAGNOSIS — N186 End stage renal disease: Secondary | ICD-10-CM | POA: Diagnosis not present

## 2019-04-17 DIAGNOSIS — D631 Anemia in chronic kidney disease: Secondary | ICD-10-CM | POA: Diagnosis not present

## 2019-04-17 DIAGNOSIS — N2581 Secondary hyperparathyroidism of renal origin: Secondary | ICD-10-CM | POA: Diagnosis not present

## 2019-04-17 DIAGNOSIS — D509 Iron deficiency anemia, unspecified: Secondary | ICD-10-CM | POA: Diagnosis not present

## 2019-04-20 DIAGNOSIS — E0869 Diabetes mellitus due to underlying condition with other specified complication: Secondary | ICD-10-CM | POA: Diagnosis not present

## 2019-04-20 DIAGNOSIS — D631 Anemia in chronic kidney disease: Secondary | ICD-10-CM | POA: Diagnosis not present

## 2019-04-20 DIAGNOSIS — D509 Iron deficiency anemia, unspecified: Secondary | ICD-10-CM | POA: Diagnosis not present

## 2019-04-20 DIAGNOSIS — Z992 Dependence on renal dialysis: Secondary | ICD-10-CM | POA: Diagnosis not present

## 2019-04-20 DIAGNOSIS — N2581 Secondary hyperparathyroidism of renal origin: Secondary | ICD-10-CM | POA: Diagnosis not present

## 2019-04-20 DIAGNOSIS — N186 End stage renal disease: Secondary | ICD-10-CM | POA: Diagnosis not present

## 2019-04-21 ENCOUNTER — Telehealth: Payer: Self-pay | Admitting: Interventional Cardiology

## 2019-04-21 NOTE — Telephone Encounter (Signed)
Called and spoke to [facility]. Made her aware we are limiting visitors for patients with cognitive impairment due to covid. Made her aware that she can drop patient off in the lobby at our screening table and our staff will bring her upstairs and back downstairs after her appt. She verbalized understanding.

## 2019-04-21 NOTE — Telephone Encounter (Signed)
New Message:  [name], Daughter of the patient called and said that the patient is wheelchair bound, and will need someone to help the patient get around the office . Either the patient's husband or the patient's Granddaughter will be the one  to be with the patient during her appointment.  Please call the daughter [name] with the office's decision

## 2019-04-22 DIAGNOSIS — E0869 Diabetes mellitus due to underlying condition with other specified complication: Secondary | ICD-10-CM | POA: Diagnosis not present

## 2019-04-22 DIAGNOSIS — N2581 Secondary hyperparathyroidism of renal origin: Secondary | ICD-10-CM | POA: Diagnosis not present

## 2019-04-22 DIAGNOSIS — D631 Anemia in chronic kidney disease: Secondary | ICD-10-CM | POA: Diagnosis not present

## 2019-04-22 DIAGNOSIS — Z992 Dependence on renal dialysis: Secondary | ICD-10-CM | POA: Diagnosis not present

## 2019-04-22 DIAGNOSIS — D509 Iron deficiency anemia, unspecified: Secondary | ICD-10-CM | POA: Diagnosis not present

## 2019-04-22 DIAGNOSIS — N186 End stage renal disease: Secondary | ICD-10-CM | POA: Diagnosis not present

## 2019-04-22 NOTE — Progress Notes (Signed)
Cardiology Office Note   Date:  04/23/2019   ID:  [name], [name] 1940-09-06, MRN [id number]  PCP:  Josetta Huddle, MD    No chief complaint on file.  CAD  Wt Readings from Last 3 Encounters:  04/23/19 161 lb 6.4 oz (73.2 kg)  08/31/18 160 lb (72.6 kg)  08/19/18 158 lb 6.4 oz (71.8 kg)       History of Present Illness: [name] is a 78 y.o. female  with a history of coronary artery disease. She also has renal insufficiency. She had an episode of right-sided weakness and confusion in December 2016. This was concerning for a TIA. She has a history of postoperative atrial fibrillation after bypass surgery several years ago. The concern was then made that perhaps she had paroxysmal atrial fibrillation, and embolism and her TIA symptoms. Her neurologic symptoms have improved significantly.    A monitor was placedin early 2017. Preliminary results show no evidence of atrial fibrillation. The thought was that if she did have evidence of atrial fibrillation, she would require anticoagulation, likely with Coumadin due to her renal insufficiency. In the interim, she has only been on aspirin.  She had a fall while in [place] in 7/18.  She had some type of ICH.  She was at [facility].    She was anemic and this improved with iron IV.  Hbg increased to > 11, from 9.1.    She has a fistula placed in 2015.  This was revised in 2019. Cath in 2019 for cardiac arrest:  Ost 1st Mrg lesion is 75% stenosed.  Prox Cx lesion is 100% stenosed. Left to left collaterals. Small AV groove circumflex.  Ost 1st Diag lesion is 100% stenosed. SVG to diag is patent.  Prox LAD lesion is 95% stenosed. LIMA to LAD is patent.  Prox RCA lesion is 95% stenosed. Mid Graft lesion is 95% stenosed in SVG to PLA.  A drug-eluting stent was successfully placed using a STENT RESOLUTE ONYX 2.0X15.  Post intervention, there is a 0% residual stenosis.  LV end diastolic pressure is normal.   There is no aortic valve stenosis.  Calcified femoral artery.  Denies : Chest pain. Dizziness. Leg edema. Nitroglycerin use. Orthopnea. Palpitations. Paroxysmal nocturnal dyspnea. Shortness of breath. Syncope.   Not much activity.    Tolerating dialysis other than for leg cramps.  No chest pain.    Past Medical History:  Diagnosis Date  . Anemia   . Anxiety   . Arthritis   . Atrial fibrillation ([place])   . Blood transfusion   . C. difficile diarrhea   . CAD (coronary artery disease)   . Carotid artery occlusion    Carotid Endartectom,y - left 2009.  Blockage Right being watched by Dr Scot Dock.  . Carotid stenosis   . 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 Complication of anesthesia    pt. states that she was difficult to wake  . Depression   . Diabetes mellitus without complication ([place])   . Diverticulosis   . Dysrhythmia   . General weakness 12/2015  . GERD (gastroesophageal reflux disease)   . Glaucoma   . History of hiatal hernia   . History of kidney stones    passed  . History of pneumonia   . HOH (hard of hearing)   . Hypertension   . Hypokalemia 12/2015  . Melanoma ([place])    .  top of head- melonoma  .  Myocardial infarction ([place])   . Neuromuscular disorder ([place])    CARPEL TUNNEL  . Pneumonia   . Shortness of breath   . 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 Stroke ([place])    hx of TIA    Past Surgical History:  Procedure Laterality Date  . A/V FISTULAGRAM N/A 07/21/2017   Procedure: A/V FISTULAGRAM - Left Arm;  Surgeon: Angelia Mould, MD;  Location: [facility];  Service: Cardiovascular;  Laterality: N/A;  . AV FISTULA PLACEMENT Left 05/31/2014   Procedure: Creation of Left Arm arteriovenous brachiocephalic Fistula;  Surgeon: Angelia Mould, MD;  Location: [facility];  Service: Vascular;  Laterality: Left;  . AV FISTULA PLACEMENT Left 09/01/2014   Procedure: INSERTION OF ARTERIOVENOUS (AV) GORE-TEX GRAFT LEFT UPPER ARM USING  4-7  MM X 45 CM SRTETCH GORETEX GRAFT;  Surgeon: Angelia Mould, MD;  Location: [facility];  Service: Vascular;  Laterality: Left;  . BACK SURGERY    . CARDIAC CATHETERIZATION    . CAROTID ENDARTERECTOMY Left 2009    CEA  . carpel tunnel    . COLONOSCOPY  07/25/2011   Procedure: COLONOSCOPY;  Surgeon: Winfield Cunas., MD;  Location[facility];  Service: Endoscopy;  Laterality: N/A;  . CORONARY ARTERY BYPASS GRAFT  07/29/2011   Procedure: CORONARY ARTERY BYPASS GRAFTING (CABG);  Surgeon: Gaye Pollack, MD;  Location: [facility];  Service: Open Heart Surgery;  Laterality: N/A;  . CORONARY STENT INTERVENTION N/A 04/08/2018   Procedure: CORONARY STENT INTERVENTION;  Surgeon: Jettie Booze, MD;  Location: [place] [facility];  Service: Cardiovascular;  Laterality: N/A;  . ENDARTERECTOMY Right 04/21/2015   Procedure: ENDARTERECTOMY CAROTID;  Surgeon: Angelia Mould, MD;  Location: [facility];  Service: Vascular;  Laterality: Right;  . ESOPHAGOGASTRODUODENOSCOPY  07/25/2011   Procedure: ESOPHAGOGASTRODUODENOSCOPY (EGD);  Surgeon: Winfield Cunas., MD;  Location: [facility] ENDOSCOPY;  Service: Endoscopy;  Laterality: N/A;  . EYE SURGERY    . FISTULOGRAM Left 08/29/2014   Procedure: FISTULOGRAM;  Surgeon: Angelia Mould, MD;  Location: [facility] CATH LAB;  Service: Cardiovascular;  Laterality: Left;  . KNEE SURGERY Left   . LEFT HEART CATH AND CORS/GRAFTS ANGIOGRAPHY N/A 04/08/2018   Procedure: LEFT HEART CATH AND CORS/GRAFTS ANGIOGRAPHY;  Surgeon: Jettie Booze, MD;  Location: [facility];  Service: Cardiovascular;  Laterality: N/A;  . LUMBAR LAMINECTOMY/DECOMPRESSION MICRODISCECTOMY N/A 12/01/2015   Procedure: L5-S1 Decompression and Bilateral Microdiscectomy;  Surgeon: Marybelle Killings, MD;  Location: [facility];  Service: Orthopedics;  Laterality: N/A;  . NECK SURGERY    . PERIPHERAL VASCULAR BALLOON ANGIOPLASTY  07/21/2017   Procedure: PERIPHERAL VASCULAR BALLOON ANGIOPLASTY;  Surgeon: Angelia Mould, MD;  Location: [facility];  Service: Cardiovascular;;  LT Arm AVF  . TONSILLECTOMY       Current Outpatient Medications  Medication Sig Dispense Refill  . acetaminophen (TYLENOL) 325 MG tablet Take 2 tablets (650 mg total) by mouth every 6 (six) hours as needed for mild pain.    Marland Kitchen allopurinol (ZYLOPRIM) 100 MG tablet Take 100 mg by mouth daily.     Marland Kitchen aspirin EC 81 MG tablet Take 1 tablet (81 mg total) by mouth daily. 90 tablet 3  . atorvastatin (LIPITOR) 40 MG tablet Take 1 tablet (40 mg total) by mouth daily at 6 PM. 30 tablet 1  . b complex-C-folic acid 1 MG capsule Take 1 capsule by mouth daily.    Marland Kitchen  BRILINTA 90 MG TABS tablet TAKE 1 TABLET BY MOUTH TWICE DAILY 180 tablet 0  . brimonidine (ALPHAGAN P) 0.1 % SOLN Place 1 drop into the left eye 2 (two) times daily.    . calcium carbonate (TUMS - DOSED IN MG ELEMENTAL CALCIUM) 500 MG chewable tablet Chew 1 tablet (200 mg of elemental calcium total) by mouth every 6 (six) hours as needed for indigestion or heartburn.    . Cholecalciferol (VITAMIN D) 2000 UNITS tablet Take 2,000 Units by mouth 2 (two) times daily.    . colchicine 0.6 MG tablet Take 0.6 mg by mouth 2 (two) times daily as needed (for gout flares).     . Cyanocobalamin (B-12) 5000 MCG CAPS Take 5,000 mcg by mouth daily.    . hydrALAZINE (APRESOLINE) 25 MG tablet Take 25 mg by mouth 2 (two) times daily. On days of dialysis take 25mg  in the evening    . hydrocortisone (ANUSOL-HC) 2.5 % rectal cream Place 1 application rectally 2 (two) times daily. Apply to a glycerin suppository and insert rectally. 30 g 1  . hydrocortisone (ANUSOL-HC) 25 MG suppository Place 1 suppository (25 mg total) rectally every 12 (twelve) hours. 12 suppository 1  . Hydrocortisone (GERHARDT'S BUTT CREAM) CREA Apply 5 application topically as needed for irritation. 5 each 1  . Insulin Glargine (LANTUS) 100 UNIT/ML Solostar Pen Inject 15 Units into the skin daily. 15 mL 11  . Insulin Pen  Needle (PEN NEEDLES) 31G X 8 MM MISC 15 Units by Does not apply route daily. 100 each 9  . nitroGLYCERIN (NITROSTAT) 0.4 MG SL tablet Place 1 tablet (0.4 mg total) under the tongue every 5 (five) minutes as needed for chest pain. 25 tablet 3  . Omega-3 Fatty Acids (FISH OIL) 1200 MG CAPS Take 1,200 mg by mouth 2 (two) times daily.    Marland Kitchen omeprazole (PRILOSEC) 20 MG capsule Take 20 mg by mouth daily as needed (acid reflux).     . polycarbophil (FIBERCON) 625 MG tablet Take 1 tablet (625 mg total) by mouth daily. 30 tablet 0  . saccharomyces boulardii (FLORASTOR) 250 MG capsule Take 1 capsule (250 mg total) by mouth 2 (two) times daily. 60 capsule 0  . sevelamer carbonate (RENVELA) 800 MG tablet Take 1 tablet (800 mg total) by mouth 3 (three) times daily with meals. 90 tablet 0  . timolol (TIMOPTIC) 0.5 % ophthalmic solution Place 1 drop into the left eye daily.    . travoprost, benzalkonium, (TRAVATAN) 0.004 % ophthalmic solution Place 1 drop into both eyes at bedtime.      No current facility-administered medications for this visit.     Allergies:   Neurontin [gabapentin], Sulfa antibiotics, and Norvasc [amlodipine]    Social History:  The patient  reports that she has never smoked. She has never used smokeless tobacco. She reports that she does not drink alcohol or use drugs.   Family History:  The patient's family history includes Colon polyps in her father; Deep vein thrombosis in her daughter; Diabetes in her daughter and mother; Heart attack in her mother; Heart disease in her daughter and mother; Hyperlipidemia in her daughter and father; Hypertension in her daughter, father, and mother; Lung cancer in her father; Peripheral vascular disease in her daughter; Stroke in her mother.    ROS:  Please see the history of present illness.   Otherwise, review of systems are positive for fatigue.   All other systems are reviewed and negative.    PHYSICAL EXAM:  VS:  BP (!) 106/40   Pulse 69   Ht  5\' 4"  (1.626 m)   Wt 161 lb 6.4 oz (73.2 kg)   SpO2 98%   BMI 27.70 kg/m  , BMI Body mass index is 27.7 kg/m. GEN: Well nourished, well developed, in no acute distress  HEENT: normal  Neck: no JVD, carotid bruits, or masses Cardiac: RRR; no murmurs, rubs, or gallops,no edema  Respiratory:  clear to auscultation bilaterally, normal work of breathing GI: soft, nontender, nondistended, + BS MS: no deformity or atrophy  Skin: warm and dry, no rash Neuro:  Strength and sensation are intact Psych: euthymic mood, full affect   EKG:   The ekg ordered today demonstrates *NSR, PVC, IRBBB   Recent Labs: 04/28/2018: BUN 37; Creatinine, Ser 5.47; Hemoglobin 8.6; Platelets 216; Potassium 4.5; Sodium 138   Lipid Panel    Component Value Date/Time   CHOL 177 07/13/2015 1630   TRIG 280 (H) 07/13/2015 1630   HDL 40 (L) 07/13/2015 1630   CHOLHDL 4.4 07/13/2015 1630   VLDL 56 (H) 07/13/2015 1630   LDLCALC 81 07/13/2015 1630     Other studies Reviewed: Additional studies/ records that were reviewed today with results demonstrating: EF 45-50%.   ASSESSMENT AND PLAN:  1. CAD: s/p CABG.  PCI to SVG in 03/2018.  No angina.  Continue aggressive secondary prevention.  2. PAF: In NSR.  COntinue Plavix.  Not a good candidate for anticoagulation.  3. Hyperlipidemia: The current medical regimen is effective;  continue present plan and medications. LDL 20.  4. ESRD: hemodialysis. 5. HTN: The current medical regimen is effective;  continue present plan and medications. 6. OK to have colonoscopy at this time if needed, Plavix could be held for 5 days prior to procedure.  Hopefully, bleeding issue will be improved with plavix monotherapy rather than aspirin and Brilinta.     Current medicines are reviewed at length with the patient today.  The patient concerns regarding her medicines were addressed.  The following changes have been made:  No change  Labs/ tests ordered today include:  No orders  of the defined types were placed in this encounter.   Recommend 150 minutes/week of aerobic exercise Low fat, low carb, high fiber diet recommended  Disposition:   FU in 1 year   Signed, Larae Grooms, MD  04/23/2019 1:42 PM    [facility] [place], [place], [place]  [address] Phone: [phone]; Fax: [phone]

## 2019-04-23 ENCOUNTER — Encounter: Payer: Self-pay | Admitting: Interventional Cardiology

## 2019-04-23 ENCOUNTER — Ambulatory Visit (INDEPENDENT_AMBULATORY_CARE_PROVIDER_SITE_OTHER): Payer: Medicare Other | Admitting: Interventional Cardiology

## 2019-04-23 ENCOUNTER — Other Ambulatory Visit: Payer: Self-pay

## 2019-04-23 VITALS — BP 106/40 | HR 69 | Ht 64.0 in | Wt 161.4 lb

## 2019-04-23 DIAGNOSIS — Z9861 Coronary angioplasty status: Secondary | ICD-10-CM

## 2019-04-23 DIAGNOSIS — N184 Chronic kidney disease, stage 4 (severe): Secondary | ICD-10-CM

## 2019-04-23 DIAGNOSIS — E782 Mixed hyperlipidemia: Secondary | ICD-10-CM | POA: Diagnosis not present

## 2019-04-23 DIAGNOSIS — I480 Paroxysmal atrial fibrillation: Secondary | ICD-10-CM

## 2019-04-23 DIAGNOSIS — T782XXS Anaphylactic shock, unspecified, sequela: Secondary | ICD-10-CM | POA: Insufficient documentation

## 2019-04-23 DIAGNOSIS — I2510 Atherosclerotic heart disease of native coronary artery without angina pectoris: Secondary | ICD-10-CM | POA: Diagnosis not present

## 2019-04-23 MED ORDER — CLOPIDOGREL BISULFATE 75 MG PO TABS: 75.0000 mg | ORAL_TABLET | Freq: Every day | ORAL | 3 refills | Status: DC

## 2019-04-23 NOTE — Patient Instructions (Signed)
Medication Instructions:  Your physician has recommended you make the following change in your medication:   1. STOP: Aspirin  2. Finish your current supply of Brilinta and then switch to clopidogrel (plavix) 75 mg once a day  Lab work: None Ordered  If you have labs (blood work) drawn today and your tests are completely normal, you will receive your results only by: Marland Kitchen MyChart Message (if you have MyChart) OR . A paper copy in the mail If you have any lab test that is abnormal or we need to change your treatment, we will call you to review the results.  Testing/Procedures: None ordered  Follow-Up: At [facility], you and your health needs are our priority.  As part of our continuing mission to provide you with exceptional heart care, we have created designated Provider Care Teams.  These Care Teams include your primary Cardiologist (physician) and Advanced Practice Providers (APPs -  Physician Assistants and Nurse Practitioners) who all work together to provide you with the care you need, when you need it. . You will need a follow up appointment in 1 year.  Please call our office 2 months in advance to schedule this appointment.  You may see Casandra Doffing, MD or one of the following Advanced Practice Providers on your designated Care Team:   . Lyda Jester, PA-C . Dayna Dunn, PA-C . Ermalinda Barrios, PA-C  Any Other Special Instructions Will Be Listed Below (If Applicable).

## 2019-04-24 DIAGNOSIS — D631 Anemia in chronic kidney disease: Secondary | ICD-10-CM | POA: Diagnosis not present

## 2019-04-24 DIAGNOSIS — N2581 Secondary hyperparathyroidism of renal origin: Secondary | ICD-10-CM | POA: Diagnosis not present

## 2019-04-24 DIAGNOSIS — E0869 Diabetes mellitus due to underlying condition with other specified complication: Secondary | ICD-10-CM | POA: Diagnosis not present

## 2019-04-24 DIAGNOSIS — Z992 Dependence on renal dialysis: Secondary | ICD-10-CM | POA: Diagnosis not present

## 2019-04-24 DIAGNOSIS — D509 Iron deficiency anemia, unspecified: Secondary | ICD-10-CM | POA: Diagnosis not present

## 2019-04-24 DIAGNOSIS — N186 End stage renal disease: Secondary | ICD-10-CM | POA: Diagnosis not present

## 2019-04-27 DIAGNOSIS — N186 End stage renal disease: Secondary | ICD-10-CM | POA: Diagnosis not present

## 2019-04-27 DIAGNOSIS — Z992 Dependence on renal dialysis: Secondary | ICD-10-CM | POA: Diagnosis not present

## 2019-04-27 DIAGNOSIS — D631 Anemia in chronic kidney disease: Secondary | ICD-10-CM | POA: Diagnosis not present

## 2019-04-27 DIAGNOSIS — N2581 Secondary hyperparathyroidism of renal origin: Secondary | ICD-10-CM | POA: Diagnosis not present

## 2019-04-27 DIAGNOSIS — E0869 Diabetes mellitus due to underlying condition with other specified complication: Secondary | ICD-10-CM | POA: Diagnosis not present

## 2019-04-27 DIAGNOSIS — D509 Iron deficiency anemia, unspecified: Secondary | ICD-10-CM | POA: Diagnosis not present

## 2019-04-29 DIAGNOSIS — N186 End stage renal disease: Secondary | ICD-10-CM | POA: Diagnosis not present

## 2019-04-29 DIAGNOSIS — Z992 Dependence on renal dialysis: Secondary | ICD-10-CM | POA: Diagnosis not present

## 2019-04-29 DIAGNOSIS — E0869 Diabetes mellitus due to underlying condition with other specified complication: Secondary | ICD-10-CM | POA: Diagnosis not present

## 2019-04-29 DIAGNOSIS — D509 Iron deficiency anemia, unspecified: Secondary | ICD-10-CM | POA: Diagnosis not present

## 2019-04-29 DIAGNOSIS — N2581 Secondary hyperparathyroidism of renal origin: Secondary | ICD-10-CM | POA: Diagnosis not present

## 2019-04-29 DIAGNOSIS — D631 Anemia in chronic kidney disease: Secondary | ICD-10-CM | POA: Diagnosis not present

## 2019-05-01 DIAGNOSIS — N186 End stage renal disease: Secondary | ICD-10-CM | POA: Diagnosis not present

## 2019-05-01 DIAGNOSIS — D631 Anemia in chronic kidney disease: Secondary | ICD-10-CM | POA: Diagnosis not present

## 2019-05-01 DIAGNOSIS — Z992 Dependence on renal dialysis: Secondary | ICD-10-CM | POA: Diagnosis not present

## 2019-05-01 DIAGNOSIS — N2581 Secondary hyperparathyroidism of renal origin: Secondary | ICD-10-CM | POA: Diagnosis not present

## 2019-05-01 DIAGNOSIS — D509 Iron deficiency anemia, unspecified: Secondary | ICD-10-CM | POA: Diagnosis not present

## 2019-05-01 DIAGNOSIS — E0869 Diabetes mellitus due to underlying condition with other specified complication: Secondary | ICD-10-CM | POA: Diagnosis not present

## 2019-05-04 DIAGNOSIS — D631 Anemia in chronic kidney disease: Secondary | ICD-10-CM | POA: Diagnosis not present

## 2019-05-04 DIAGNOSIS — D509 Iron deficiency anemia, unspecified: Secondary | ICD-10-CM | POA: Diagnosis not present

## 2019-05-04 DIAGNOSIS — Z992 Dependence on renal dialysis: Secondary | ICD-10-CM | POA: Diagnosis not present

## 2019-05-04 DIAGNOSIS — E0869 Diabetes mellitus due to underlying condition with other specified complication: Secondary | ICD-10-CM | POA: Diagnosis not present

## 2019-05-04 DIAGNOSIS — N2581 Secondary hyperparathyroidism of renal origin: Secondary | ICD-10-CM | POA: Diagnosis not present

## 2019-05-04 DIAGNOSIS — N186 End stage renal disease: Secondary | ICD-10-CM | POA: Diagnosis not present

## 2019-05-06 DIAGNOSIS — N186 End stage renal disease: Secondary | ICD-10-CM | POA: Diagnosis not present

## 2019-05-06 DIAGNOSIS — E0869 Diabetes mellitus due to underlying condition with other specified complication: Secondary | ICD-10-CM | POA: Diagnosis not present

## 2019-05-06 DIAGNOSIS — N2581 Secondary hyperparathyroidism of renal origin: Secondary | ICD-10-CM | POA: Diagnosis not present

## 2019-05-06 DIAGNOSIS — Z992 Dependence on renal dialysis: Secondary | ICD-10-CM | POA: Diagnosis not present

## 2019-05-06 DIAGNOSIS — D631 Anemia in chronic kidney disease: Secondary | ICD-10-CM | POA: Diagnosis not present

## 2019-05-06 DIAGNOSIS — D509 Iron deficiency anemia, unspecified: Secondary | ICD-10-CM | POA: Diagnosis not present

## 2019-05-08 DIAGNOSIS — D509 Iron deficiency anemia, unspecified: Secondary | ICD-10-CM | POA: Diagnosis not present

## 2019-05-08 DIAGNOSIS — D631 Anemia in chronic kidney disease: Secondary | ICD-10-CM | POA: Diagnosis not present

## 2019-05-08 DIAGNOSIS — Z992 Dependence on renal dialysis: Secondary | ICD-10-CM | POA: Diagnosis not present

## 2019-05-08 DIAGNOSIS — E0869 Diabetes mellitus due to underlying condition with other specified complication: Secondary | ICD-10-CM | POA: Diagnosis not present

## 2019-05-08 DIAGNOSIS — N2581 Secondary hyperparathyroidism of renal origin: Secondary | ICD-10-CM | POA: Diagnosis not present

## 2019-05-08 DIAGNOSIS — N186 End stage renal disease: Secondary | ICD-10-CM | POA: Diagnosis not present

## 2019-05-11 DIAGNOSIS — E0869 Diabetes mellitus due to underlying condition with other specified complication: Secondary | ICD-10-CM | POA: Diagnosis not present

## 2019-05-11 DIAGNOSIS — N2581 Secondary hyperparathyroidism of renal origin: Secondary | ICD-10-CM | POA: Diagnosis not present

## 2019-05-11 DIAGNOSIS — D509 Iron deficiency anemia, unspecified: Secondary | ICD-10-CM | POA: Diagnosis not present

## 2019-05-11 DIAGNOSIS — D631 Anemia in chronic kidney disease: Secondary | ICD-10-CM | POA: Diagnosis not present

## 2019-05-11 DIAGNOSIS — Z992 Dependence on renal dialysis: Secondary | ICD-10-CM | POA: Diagnosis not present

## 2019-05-11 DIAGNOSIS — N186 End stage renal disease: Secondary | ICD-10-CM | POA: Diagnosis not present

## 2019-05-12 DIAGNOSIS — E11319 Type 2 diabetes mellitus with unspecified diabetic retinopathy without macular edema: Secondary | ICD-10-CM | POA: Diagnosis not present

## 2019-05-12 DIAGNOSIS — D631 Anemia in chronic kidney disease: Secondary | ICD-10-CM | POA: Diagnosis not present

## 2019-05-12 DIAGNOSIS — I10 Essential (primary) hypertension: Secondary | ICD-10-CM | POA: Diagnosis not present

## 2019-05-12 DIAGNOSIS — E0840 Diabetes mellitus due to underlying condition with diabetic neuropathy, unspecified: Secondary | ICD-10-CM | POA: Diagnosis not present

## 2019-05-12 DIAGNOSIS — N184 Chronic kidney disease, stage 4 (severe): Secondary | ICD-10-CM | POA: Diagnosis not present

## 2019-05-12 DIAGNOSIS — E1165 Type 2 diabetes mellitus with hyperglycemia: Secondary | ICD-10-CM | POA: Diagnosis not present

## 2019-05-12 DIAGNOSIS — E1140 Type 2 diabetes mellitus with diabetic neuropathy, unspecified: Secondary | ICD-10-CM | POA: Diagnosis not present

## 2019-05-12 DIAGNOSIS — E781 Pure hyperglyceridemia: Secondary | ICD-10-CM | POA: Diagnosis not present

## 2019-05-12 DIAGNOSIS — I2510 Atherosclerotic heart disease of native coronary artery without angina pectoris: Secondary | ICD-10-CM | POA: Diagnosis not present

## 2019-05-12 DIAGNOSIS — J45991 Cough variant asthma: Secondary | ICD-10-CM | POA: Diagnosis not present

## 2019-05-12 DIAGNOSIS — E1121 Type 2 diabetes mellitus with diabetic nephropathy: Secondary | ICD-10-CM | POA: Diagnosis not present

## 2019-05-12 DIAGNOSIS — E782 Mixed hyperlipidemia: Secondary | ICD-10-CM | POA: Diagnosis not present

## 2019-05-13 DIAGNOSIS — Z992 Dependence on renal dialysis: Secondary | ICD-10-CM | POA: Diagnosis not present

## 2019-05-13 DIAGNOSIS — E0869 Diabetes mellitus due to underlying condition with other specified complication: Secondary | ICD-10-CM | POA: Diagnosis not present

## 2019-05-13 DIAGNOSIS — N2581 Secondary hyperparathyroidism of renal origin: Secondary | ICD-10-CM | POA: Diagnosis not present

## 2019-05-13 DIAGNOSIS — D509 Iron deficiency anemia, unspecified: Secondary | ICD-10-CM | POA: Diagnosis not present

## 2019-05-13 DIAGNOSIS — D631 Anemia in chronic kidney disease: Secondary | ICD-10-CM | POA: Diagnosis not present

## 2019-05-13 DIAGNOSIS — N186 End stage renal disease: Secondary | ICD-10-CM | POA: Diagnosis not present

## 2019-05-14 ENCOUNTER — Other Ambulatory Visit: Payer: Self-pay | Admitting: Cardiology

## 2019-05-15 DIAGNOSIS — D631 Anemia in chronic kidney disease: Secondary | ICD-10-CM | POA: Diagnosis not present

## 2019-05-15 DIAGNOSIS — Z992 Dependence on renal dialysis: Secondary | ICD-10-CM | POA: Diagnosis not present

## 2019-05-15 DIAGNOSIS — D509 Iron deficiency anemia, unspecified: Secondary | ICD-10-CM | POA: Diagnosis not present

## 2019-05-15 DIAGNOSIS — E0869 Diabetes mellitus due to underlying condition with other specified complication: Secondary | ICD-10-CM | POA: Diagnosis not present

## 2019-05-15 DIAGNOSIS — N2581 Secondary hyperparathyroidism of renal origin: Secondary | ICD-10-CM | POA: Diagnosis not present

## 2019-05-15 DIAGNOSIS — N186 End stage renal disease: Secondary | ICD-10-CM | POA: Diagnosis not present

## 2019-05-16 DIAGNOSIS — E1129 Type 2 diabetes mellitus with other diabetic kidney complication: Secondary | ICD-10-CM | POA: Diagnosis not present

## 2019-05-16 DIAGNOSIS — N186 End stage renal disease: Secondary | ICD-10-CM | POA: Diagnosis not present

## 2019-05-16 DIAGNOSIS — Z992 Dependence on renal dialysis: Secondary | ICD-10-CM | POA: Diagnosis not present

## 2019-05-17 ENCOUNTER — Other Ambulatory Visit: Payer: Self-pay | Admitting: *Deleted

## 2019-05-17 MED ORDER — HYDRALAZINE HCL 25 MG PO TABS: 25.0000 mg | ORAL_TABLET | Freq: Two times a day (BID) | ORAL | 1 refills | Status: DC

## 2019-05-17 NOTE — Telephone Encounter (Signed)
Left message for patient to call back. Need to verify what dose of hydralazine she has been taking.

## 2019-05-18 DIAGNOSIS — E876 Hypokalemia: Secondary | ICD-10-CM | POA: Diagnosis not present

## 2019-05-18 DIAGNOSIS — E0869 Diabetes mellitus due to underlying condition with other specified complication: Secondary | ICD-10-CM | POA: Diagnosis not present

## 2019-05-18 DIAGNOSIS — D631 Anemia in chronic kidney disease: Secondary | ICD-10-CM | POA: Diagnosis not present

## 2019-05-18 DIAGNOSIS — Z992 Dependence on renal dialysis: Secondary | ICD-10-CM | POA: Diagnosis not present

## 2019-05-18 DIAGNOSIS — N2581 Secondary hyperparathyroidism of renal origin: Secondary | ICD-10-CM | POA: Diagnosis not present

## 2019-05-18 DIAGNOSIS — Z23 Encounter for immunization: Secondary | ICD-10-CM | POA: Diagnosis not present

## 2019-05-18 DIAGNOSIS — N186 End stage renal disease: Secondary | ICD-10-CM | POA: Diagnosis not present

## 2019-05-18 DIAGNOSIS — D509 Iron deficiency anemia, unspecified: Secondary | ICD-10-CM | POA: Diagnosis not present

## 2019-05-20 DIAGNOSIS — E876 Hypokalemia: Secondary | ICD-10-CM | POA: Diagnosis not present

## 2019-05-20 DIAGNOSIS — N2581 Secondary hyperparathyroidism of renal origin: Secondary | ICD-10-CM | POA: Diagnosis not present

## 2019-05-20 DIAGNOSIS — N186 End stage renal disease: Secondary | ICD-10-CM | POA: Diagnosis not present

## 2019-05-20 DIAGNOSIS — D509 Iron deficiency anemia, unspecified: Secondary | ICD-10-CM | POA: Diagnosis not present

## 2019-05-20 DIAGNOSIS — Z992 Dependence on renal dialysis: Secondary | ICD-10-CM | POA: Diagnosis not present

## 2019-05-20 DIAGNOSIS — D631 Anemia in chronic kidney disease: Secondary | ICD-10-CM | POA: Diagnosis not present

## 2019-05-22 DIAGNOSIS — D509 Iron deficiency anemia, unspecified: Secondary | ICD-10-CM | POA: Diagnosis not present

## 2019-05-22 DIAGNOSIS — N2581 Secondary hyperparathyroidism of renal origin: Secondary | ICD-10-CM | POA: Diagnosis not present

## 2019-05-22 DIAGNOSIS — Z992 Dependence on renal dialysis: Secondary | ICD-10-CM | POA: Diagnosis not present

## 2019-05-22 DIAGNOSIS — D631 Anemia in chronic kidney disease: Secondary | ICD-10-CM | POA: Diagnosis not present

## 2019-05-22 DIAGNOSIS — N186 End stage renal disease: Secondary | ICD-10-CM | POA: Diagnosis not present

## 2019-05-22 DIAGNOSIS — E876 Hypokalemia: Secondary | ICD-10-CM | POA: Diagnosis not present

## 2019-05-24 ENCOUNTER — Other Ambulatory Visit: Payer: Self-pay

## 2019-05-25 DIAGNOSIS — D509 Iron deficiency anemia, unspecified: Secondary | ICD-10-CM | POA: Diagnosis not present

## 2019-05-25 DIAGNOSIS — N2581 Secondary hyperparathyroidism of renal origin: Secondary | ICD-10-CM | POA: Diagnosis not present

## 2019-05-25 DIAGNOSIS — E876 Hypokalemia: Secondary | ICD-10-CM | POA: Diagnosis not present

## 2019-05-25 DIAGNOSIS — N186 End stage renal disease: Secondary | ICD-10-CM | POA: Diagnosis not present

## 2019-05-25 DIAGNOSIS — D631 Anemia in chronic kidney disease: Secondary | ICD-10-CM | POA: Diagnosis not present

## 2019-05-25 DIAGNOSIS — Z992 Dependence on renal dialysis: Secondary | ICD-10-CM | POA: Diagnosis not present

## 2019-05-27 DIAGNOSIS — N2581 Secondary hyperparathyroidism of renal origin: Secondary | ICD-10-CM | POA: Diagnosis not present

## 2019-05-27 DIAGNOSIS — D509 Iron deficiency anemia, unspecified: Secondary | ICD-10-CM | POA: Diagnosis not present

## 2019-05-27 DIAGNOSIS — D631 Anemia in chronic kidney disease: Secondary | ICD-10-CM | POA: Diagnosis not present

## 2019-05-27 DIAGNOSIS — E876 Hypokalemia: Secondary | ICD-10-CM | POA: Diagnosis not present

## 2019-05-27 DIAGNOSIS — Z992 Dependence on renal dialysis: Secondary | ICD-10-CM | POA: Diagnosis not present

## 2019-05-27 DIAGNOSIS — N186 End stage renal disease: Secondary | ICD-10-CM | POA: Diagnosis not present

## 2019-05-27 NOTE — Telephone Encounter (Signed)
Left message for patient to call back. Need to verify what dose of hydralazine she has been taking.

## 2019-05-28 DIAGNOSIS — E1140 Type 2 diabetes mellitus with diabetic neuropathy, unspecified: Secondary | ICD-10-CM | POA: Diagnosis not present

## 2019-05-28 DIAGNOSIS — D631 Anemia in chronic kidney disease: Secondary | ICD-10-CM | POA: Diagnosis not present

## 2019-05-28 DIAGNOSIS — N184 Chronic kidney disease, stage 4 (severe): Secondary | ICD-10-CM | POA: Diagnosis not present

## 2019-05-28 DIAGNOSIS — I10 Essential (primary) hypertension: Secondary | ICD-10-CM | POA: Diagnosis not present

## 2019-05-28 DIAGNOSIS — E1121 Type 2 diabetes mellitus with diabetic nephropathy: Secondary | ICD-10-CM | POA: Diagnosis not present

## 2019-05-28 DIAGNOSIS — J45991 Cough variant asthma: Secondary | ICD-10-CM | POA: Diagnosis not present

## 2019-05-28 DIAGNOSIS — E0840 Diabetes mellitus due to underlying condition with diabetic neuropathy, unspecified: Secondary | ICD-10-CM | POA: Diagnosis not present

## 2019-05-28 DIAGNOSIS — E1165 Type 2 diabetes mellitus with hyperglycemia: Secondary | ICD-10-CM | POA: Diagnosis not present

## 2019-05-28 DIAGNOSIS — E781 Pure hyperglyceridemia: Secondary | ICD-10-CM | POA: Diagnosis not present

## 2019-05-28 DIAGNOSIS — E782 Mixed hyperlipidemia: Secondary | ICD-10-CM | POA: Diagnosis not present

## 2019-05-28 DIAGNOSIS — E11319 Type 2 diabetes mellitus with unspecified diabetic retinopathy without macular edema: Secondary | ICD-10-CM | POA: Diagnosis not present

## 2019-05-28 DIAGNOSIS — I2510 Atherosclerotic heart disease of native coronary artery without angina pectoris: Secondary | ICD-10-CM | POA: Diagnosis not present

## 2019-05-29 DIAGNOSIS — E876 Hypokalemia: Secondary | ICD-10-CM | POA: Diagnosis not present

## 2019-05-29 DIAGNOSIS — D631 Anemia in chronic kidney disease: Secondary | ICD-10-CM | POA: Diagnosis not present

## 2019-05-29 DIAGNOSIS — Z992 Dependence on renal dialysis: Secondary | ICD-10-CM | POA: Diagnosis not present

## 2019-05-29 DIAGNOSIS — N186 End stage renal disease: Secondary | ICD-10-CM | POA: Diagnosis not present

## 2019-05-29 DIAGNOSIS — N2581 Secondary hyperparathyroidism of renal origin: Secondary | ICD-10-CM | POA: Diagnosis not present

## 2019-05-29 DIAGNOSIS — D509 Iron deficiency anemia, unspecified: Secondary | ICD-10-CM | POA: Diagnosis not present

## 2019-06-01 DIAGNOSIS — D509 Iron deficiency anemia, unspecified: Secondary | ICD-10-CM | POA: Diagnosis not present

## 2019-06-01 DIAGNOSIS — D631 Anemia in chronic kidney disease: Secondary | ICD-10-CM | POA: Diagnosis not present

## 2019-06-01 DIAGNOSIS — Z992 Dependence on renal dialysis: Secondary | ICD-10-CM | POA: Diagnosis not present

## 2019-06-01 DIAGNOSIS — E876 Hypokalemia: Secondary | ICD-10-CM | POA: Diagnosis not present

## 2019-06-01 DIAGNOSIS — N2581 Secondary hyperparathyroidism of renal origin: Secondary | ICD-10-CM | POA: Diagnosis not present

## 2019-06-01 DIAGNOSIS — N186 End stage renal disease: Secondary | ICD-10-CM | POA: Diagnosis not present

## 2019-06-03 DIAGNOSIS — N186 End stage renal disease: Secondary | ICD-10-CM | POA: Diagnosis not present

## 2019-06-03 DIAGNOSIS — Z992 Dependence on renal dialysis: Secondary | ICD-10-CM | POA: Diagnosis not present

## 2019-06-03 DIAGNOSIS — N2581 Secondary hyperparathyroidism of renal origin: Secondary | ICD-10-CM | POA: Diagnosis not present

## 2019-06-03 DIAGNOSIS — D631 Anemia in chronic kidney disease: Secondary | ICD-10-CM | POA: Diagnosis not present

## 2019-06-03 DIAGNOSIS — D509 Iron deficiency anemia, unspecified: Secondary | ICD-10-CM | POA: Diagnosis not present

## 2019-06-03 DIAGNOSIS — E876 Hypokalemia: Secondary | ICD-10-CM | POA: Diagnosis not present

## 2019-06-07 DIAGNOSIS — N186 End stage renal disease: Secondary | ICD-10-CM | POA: Diagnosis not present

## 2019-06-07 DIAGNOSIS — E876 Hypokalemia: Secondary | ICD-10-CM | POA: Diagnosis not present

## 2019-06-07 DIAGNOSIS — Z992 Dependence on renal dialysis: Secondary | ICD-10-CM | POA: Diagnosis not present

## 2019-06-07 DIAGNOSIS — D631 Anemia in chronic kidney disease: Secondary | ICD-10-CM | POA: Diagnosis not present

## 2019-06-07 DIAGNOSIS — D509 Iron deficiency anemia, unspecified: Secondary | ICD-10-CM | POA: Diagnosis not present

## 2019-06-07 DIAGNOSIS — N2581 Secondary hyperparathyroidism of renal origin: Secondary | ICD-10-CM | POA: Diagnosis not present

## 2019-06-09 DIAGNOSIS — N186 End stage renal disease: Secondary | ICD-10-CM | POA: Diagnosis not present

## 2019-06-09 DIAGNOSIS — N2581 Secondary hyperparathyroidism of renal origin: Secondary | ICD-10-CM | POA: Diagnosis not present

## 2019-06-09 DIAGNOSIS — D509 Iron deficiency anemia, unspecified: Secondary | ICD-10-CM | POA: Diagnosis not present

## 2019-06-09 DIAGNOSIS — Z992 Dependence on renal dialysis: Secondary | ICD-10-CM | POA: Diagnosis not present

## 2019-06-09 DIAGNOSIS — E876 Hypokalemia: Secondary | ICD-10-CM | POA: Diagnosis not present

## 2019-06-09 DIAGNOSIS — D631 Anemia in chronic kidney disease: Secondary | ICD-10-CM | POA: Diagnosis not present

## 2019-06-12 DIAGNOSIS — N186 End stage renal disease: Secondary | ICD-10-CM | POA: Diagnosis not present

## 2019-06-12 DIAGNOSIS — D631 Anemia in chronic kidney disease: Secondary | ICD-10-CM | POA: Diagnosis not present

## 2019-06-12 DIAGNOSIS — Z992 Dependence on renal dialysis: Secondary | ICD-10-CM | POA: Diagnosis not present

## 2019-06-12 DIAGNOSIS — N2581 Secondary hyperparathyroidism of renal origin: Secondary | ICD-10-CM | POA: Diagnosis not present

## 2019-06-12 DIAGNOSIS — E876 Hypokalemia: Secondary | ICD-10-CM | POA: Diagnosis not present

## 2019-06-12 DIAGNOSIS — D509 Iron deficiency anemia, unspecified: Secondary | ICD-10-CM | POA: Diagnosis not present

## 2019-06-16 ENCOUNTER — Telehealth: Payer: Self-pay

## 2019-06-16 MED ORDER — HYDRALAZINE HCL 25 MG PO TABS: 25.0000 mg | ORAL_TABLET | Freq: Two times a day (BID) | ORAL | 3 refills | Status: DC

## 2019-06-16 NOTE — Telephone Encounter (Signed)
Refill sent in for patient to take hydralazine 25 mg BID, except on dialysis days she will take PM dose only.

## 2019-06-16 NOTE — Telephone Encounter (Signed)
Left message for patient to call back. Need to verify what dose of hydralazine she has been taking.

## 2019-06-16 NOTE — Telephone Encounter (Signed)
Jeremy Johann, CMA to Me   06/16/19 12:23 PM [name] Wynonia Lawman returned your call regarding pt. - [name] is still taking hydralazine 25mg  twice per day, except on dialysis day - 1 tab in the evening

## 2019-07-14 DIAGNOSIS — E782 Mixed hyperlipidemia: Secondary | ICD-10-CM | POA: Diagnosis not present

## 2019-07-14 DIAGNOSIS — E781 Pure hyperglyceridemia: Secondary | ICD-10-CM | POA: Diagnosis not present

## 2019-07-14 DIAGNOSIS — N184 Chronic kidney disease, stage 4 (severe): Secondary | ICD-10-CM | POA: Diagnosis not present

## 2019-07-14 DIAGNOSIS — M179 Osteoarthritis of knee, unspecified: Secondary | ICD-10-CM | POA: Diagnosis not present

## 2019-07-14 DIAGNOSIS — E1165 Type 2 diabetes mellitus with hyperglycemia: Secondary | ICD-10-CM | POA: Diagnosis not present

## 2019-07-14 DIAGNOSIS — I10 Essential (primary) hypertension: Secondary | ICD-10-CM | POA: Diagnosis not present

## 2019-07-14 DIAGNOSIS — J45991 Cough variant asthma: Secondary | ICD-10-CM | POA: Diagnosis not present

## 2019-07-14 DIAGNOSIS — E0840 Diabetes mellitus due to underlying condition with diabetic neuropathy, unspecified: Secondary | ICD-10-CM | POA: Diagnosis not present

## 2019-07-14 DIAGNOSIS — E1140 Type 2 diabetes mellitus with diabetic neuropathy, unspecified: Secondary | ICD-10-CM | POA: Diagnosis not present

## 2019-07-14 DIAGNOSIS — E1121 Type 2 diabetes mellitus with diabetic nephropathy: Secondary | ICD-10-CM | POA: Diagnosis not present

## 2019-07-14 DIAGNOSIS — E11319 Type 2 diabetes mellitus with unspecified diabetic retinopathy without macular edema: Secondary | ICD-10-CM | POA: Diagnosis not present

## 2019-07-14 DIAGNOSIS — I2510 Atherosclerotic heart disease of native coronary artery without angina pectoris: Secondary | ICD-10-CM | POA: Diagnosis not present

## 2019-07-16 DIAGNOSIS — N186 End stage renal disease: Secondary | ICD-10-CM | POA: Diagnosis not present

## 2019-07-16 DIAGNOSIS — E1129 Type 2 diabetes mellitus with other diabetic kidney complication: Secondary | ICD-10-CM | POA: Diagnosis not present

## 2019-07-16 DIAGNOSIS — Z992 Dependence on renal dialysis: Secondary | ICD-10-CM | POA: Diagnosis not present

## 2019-07-18 DIAGNOSIS — N186 End stage renal disease: Secondary | ICD-10-CM | POA: Diagnosis not present

## 2019-07-18 DIAGNOSIS — Z992 Dependence on renal dialysis: Secondary | ICD-10-CM | POA: Diagnosis not present

## 2019-07-18 DIAGNOSIS — Z23 Encounter for immunization: Secondary | ICD-10-CM | POA: Diagnosis not present

## 2019-07-18 DIAGNOSIS — D509 Iron deficiency anemia, unspecified: Secondary | ICD-10-CM | POA: Diagnosis not present

## 2019-07-18 DIAGNOSIS — E876 Hypokalemia: Secondary | ICD-10-CM | POA: Diagnosis not present

## 2019-07-18 DIAGNOSIS — N2581 Secondary hyperparathyroidism of renal origin: Secondary | ICD-10-CM | POA: Diagnosis not present

## 2019-07-18 DIAGNOSIS — D631 Anemia in chronic kidney disease: Secondary | ICD-10-CM | POA: Diagnosis not present

## 2019-07-20 DIAGNOSIS — D631 Anemia in chronic kidney disease: Secondary | ICD-10-CM | POA: Diagnosis not present

## 2019-07-20 DIAGNOSIS — Z992 Dependence on renal dialysis: Secondary | ICD-10-CM | POA: Diagnosis not present

## 2019-07-20 DIAGNOSIS — N186 End stage renal disease: Secondary | ICD-10-CM | POA: Diagnosis not present

## 2019-07-20 DIAGNOSIS — D509 Iron deficiency anemia, unspecified: Secondary | ICD-10-CM | POA: Diagnosis not present

## 2019-07-20 DIAGNOSIS — N2581 Secondary hyperparathyroidism of renal origin: Secondary | ICD-10-CM | POA: Diagnosis not present

## 2019-07-20 DIAGNOSIS — E876 Hypokalemia: Secondary | ICD-10-CM | POA: Diagnosis not present

## 2019-07-22 DIAGNOSIS — N186 End stage renal disease: Secondary | ICD-10-CM | POA: Diagnosis not present

## 2019-07-22 DIAGNOSIS — E876 Hypokalemia: Secondary | ICD-10-CM | POA: Diagnosis not present

## 2019-07-22 DIAGNOSIS — D509 Iron deficiency anemia, unspecified: Secondary | ICD-10-CM | POA: Diagnosis not present

## 2019-07-22 DIAGNOSIS — Z992 Dependence on renal dialysis: Secondary | ICD-10-CM | POA: Diagnosis not present

## 2019-07-22 DIAGNOSIS — N2581 Secondary hyperparathyroidism of renal origin: Secondary | ICD-10-CM | POA: Diagnosis not present

## 2019-07-22 DIAGNOSIS — D631 Anemia in chronic kidney disease: Secondary | ICD-10-CM | POA: Diagnosis not present

## 2019-07-24 DIAGNOSIS — Z992 Dependence on renal dialysis: Secondary | ICD-10-CM | POA: Diagnosis not present

## 2019-07-24 DIAGNOSIS — N186 End stage renal disease: Secondary | ICD-10-CM | POA: Diagnosis not present

## 2019-07-24 DIAGNOSIS — E876 Hypokalemia: Secondary | ICD-10-CM | POA: Diagnosis not present

## 2019-07-24 DIAGNOSIS — D509 Iron deficiency anemia, unspecified: Secondary | ICD-10-CM | POA: Diagnosis not present

## 2019-07-24 DIAGNOSIS — N2581 Secondary hyperparathyroidism of renal origin: Secondary | ICD-10-CM | POA: Diagnosis not present

## 2019-07-24 DIAGNOSIS — D631 Anemia in chronic kidney disease: Secondary | ICD-10-CM | POA: Diagnosis not present

## 2019-07-29 DIAGNOSIS — D631 Anemia in chronic kidney disease: Secondary | ICD-10-CM | POA: Diagnosis not present

## 2019-07-29 DIAGNOSIS — R0602 Shortness of breath: Secondary | ICD-10-CM | POA: Insufficient documentation

## 2019-07-29 DIAGNOSIS — D509 Iron deficiency anemia, unspecified: Secondary | ICD-10-CM | POA: Diagnosis not present

## 2019-07-29 DIAGNOSIS — E0869 Diabetes mellitus due to underlying condition with other specified complication: Secondary | ICD-10-CM | POA: Diagnosis not present

## 2019-07-29 DIAGNOSIS — E876 Hypokalemia: Secondary | ICD-10-CM | POA: Diagnosis not present

## 2019-07-29 DIAGNOSIS — Z992 Dependence on renal dialysis: Secondary | ICD-10-CM | POA: Diagnosis not present

## 2019-07-29 DIAGNOSIS — N2581 Secondary hyperparathyroidism of renal origin: Secondary | ICD-10-CM | POA: Diagnosis not present

## 2019-07-29 DIAGNOSIS — N186 End stage renal disease: Secondary | ICD-10-CM | POA: Diagnosis not present

## 2019-07-31 DIAGNOSIS — N2581 Secondary hyperparathyroidism of renal origin: Secondary | ICD-10-CM | POA: Diagnosis not present

## 2019-07-31 DIAGNOSIS — N186 End stage renal disease: Secondary | ICD-10-CM | POA: Diagnosis not present

## 2019-07-31 DIAGNOSIS — D509 Iron deficiency anemia, unspecified: Secondary | ICD-10-CM | POA: Diagnosis not present

## 2019-07-31 DIAGNOSIS — D631 Anemia in chronic kidney disease: Secondary | ICD-10-CM | POA: Diagnosis not present

## 2019-07-31 DIAGNOSIS — E876 Hypokalemia: Secondary | ICD-10-CM | POA: Diagnosis not present

## 2019-07-31 DIAGNOSIS — Z992 Dependence on renal dialysis: Secondary | ICD-10-CM | POA: Diagnosis not present

## 2019-08-03 DIAGNOSIS — I10 Essential (primary) hypertension: Secondary | ICD-10-CM | POA: Diagnosis not present

## 2019-08-03 DIAGNOSIS — J45991 Cough variant asthma: Secondary | ICD-10-CM | POA: Diagnosis not present

## 2019-08-03 DIAGNOSIS — E11319 Type 2 diabetes mellitus with unspecified diabetic retinopathy without macular edema: Secondary | ICD-10-CM | POA: Diagnosis not present

## 2019-08-03 DIAGNOSIS — E0840 Diabetes mellitus due to underlying condition with diabetic neuropathy, unspecified: Secondary | ICD-10-CM | POA: Diagnosis not present

## 2019-08-03 DIAGNOSIS — E781 Pure hyperglyceridemia: Secondary | ICD-10-CM | POA: Diagnosis not present

## 2019-08-03 DIAGNOSIS — I2510 Atherosclerotic heart disease of native coronary artery without angina pectoris: Secondary | ICD-10-CM | POA: Diagnosis not present

## 2019-08-03 DIAGNOSIS — E876 Hypokalemia: Secondary | ICD-10-CM | POA: Diagnosis not present

## 2019-08-03 DIAGNOSIS — E1121 Type 2 diabetes mellitus with diabetic nephropathy: Secondary | ICD-10-CM | POA: Diagnosis not present

## 2019-08-03 DIAGNOSIS — D509 Iron deficiency anemia, unspecified: Secondary | ICD-10-CM | POA: Diagnosis not present

## 2019-08-03 DIAGNOSIS — E1165 Type 2 diabetes mellitus with hyperglycemia: Secondary | ICD-10-CM | POA: Diagnosis not present

## 2019-08-03 DIAGNOSIS — N184 Chronic kidney disease, stage 4 (severe): Secondary | ICD-10-CM | POA: Diagnosis not present

## 2019-08-03 DIAGNOSIS — Z992 Dependence on renal dialysis: Secondary | ICD-10-CM | POA: Diagnosis not present

## 2019-08-03 DIAGNOSIS — E782 Mixed hyperlipidemia: Secondary | ICD-10-CM | POA: Diagnosis not present

## 2019-08-03 DIAGNOSIS — D631 Anemia in chronic kidney disease: Secondary | ICD-10-CM | POA: Diagnosis not present

## 2019-08-03 DIAGNOSIS — N186 End stage renal disease: Secondary | ICD-10-CM | POA: Diagnosis not present

## 2019-08-03 DIAGNOSIS — N2581 Secondary hyperparathyroidism of renal origin: Secondary | ICD-10-CM | POA: Diagnosis not present

## 2019-08-03 DIAGNOSIS — E1140 Type 2 diabetes mellitus with diabetic neuropathy, unspecified: Secondary | ICD-10-CM | POA: Diagnosis not present

## 2019-08-05 DIAGNOSIS — N2581 Secondary hyperparathyroidism of renal origin: Secondary | ICD-10-CM | POA: Diagnosis not present

## 2019-08-05 DIAGNOSIS — Z992 Dependence on renal dialysis: Secondary | ICD-10-CM | POA: Diagnosis not present

## 2019-08-05 DIAGNOSIS — N186 End stage renal disease: Secondary | ICD-10-CM | POA: Diagnosis not present

## 2019-08-05 DIAGNOSIS — E876 Hypokalemia: Secondary | ICD-10-CM | POA: Diagnosis not present

## 2019-08-05 DIAGNOSIS — D509 Iron deficiency anemia, unspecified: Secondary | ICD-10-CM | POA: Diagnosis not present

## 2019-08-05 DIAGNOSIS — D631 Anemia in chronic kidney disease: Secondary | ICD-10-CM | POA: Diagnosis not present

## 2019-08-07 DIAGNOSIS — D509 Iron deficiency anemia, unspecified: Secondary | ICD-10-CM | POA: Diagnosis not present

## 2019-08-07 DIAGNOSIS — N186 End stage renal disease: Secondary | ICD-10-CM | POA: Diagnosis not present

## 2019-08-07 DIAGNOSIS — N2581 Secondary hyperparathyroidism of renal origin: Secondary | ICD-10-CM | POA: Diagnosis not present

## 2019-08-07 DIAGNOSIS — Z992 Dependence on renal dialysis: Secondary | ICD-10-CM | POA: Diagnosis not present

## 2019-08-07 DIAGNOSIS — E876 Hypokalemia: Secondary | ICD-10-CM | POA: Diagnosis not present

## 2019-08-07 DIAGNOSIS — D631 Anemia in chronic kidney disease: Secondary | ICD-10-CM | POA: Diagnosis not present

## 2019-08-10 DIAGNOSIS — N2581 Secondary hyperparathyroidism of renal origin: Secondary | ICD-10-CM | POA: Diagnosis not present

## 2019-08-10 DIAGNOSIS — D631 Anemia in chronic kidney disease: Secondary | ICD-10-CM | POA: Diagnosis not present

## 2019-08-10 DIAGNOSIS — Z992 Dependence on renal dialysis: Secondary | ICD-10-CM | POA: Diagnosis not present

## 2019-08-10 DIAGNOSIS — D509 Iron deficiency anemia, unspecified: Secondary | ICD-10-CM | POA: Diagnosis not present

## 2019-08-10 DIAGNOSIS — E876 Hypokalemia: Secondary | ICD-10-CM | POA: Diagnosis not present

## 2019-08-10 DIAGNOSIS — N186 End stage renal disease: Secondary | ICD-10-CM | POA: Diagnosis not present

## 2019-08-12 DIAGNOSIS — E876 Hypokalemia: Secondary | ICD-10-CM | POA: Diagnosis not present

## 2019-08-12 DIAGNOSIS — D509 Iron deficiency anemia, unspecified: Secondary | ICD-10-CM | POA: Diagnosis not present

## 2019-08-12 DIAGNOSIS — N186 End stage renal disease: Secondary | ICD-10-CM | POA: Diagnosis not present

## 2019-08-12 DIAGNOSIS — Z992 Dependence on renal dialysis: Secondary | ICD-10-CM | POA: Diagnosis not present

## 2019-08-12 DIAGNOSIS — N2581 Secondary hyperparathyroidism of renal origin: Secondary | ICD-10-CM | POA: Diagnosis not present

## 2019-08-12 DIAGNOSIS — D631 Anemia in chronic kidney disease: Secondary | ICD-10-CM | POA: Diagnosis not present

## 2019-08-14 DIAGNOSIS — D631 Anemia in chronic kidney disease: Secondary | ICD-10-CM | POA: Diagnosis not present

## 2019-08-14 DIAGNOSIS — E876 Hypokalemia: Secondary | ICD-10-CM | POA: Diagnosis not present

## 2019-08-14 DIAGNOSIS — Z992 Dependence on renal dialysis: Secondary | ICD-10-CM | POA: Diagnosis not present

## 2019-08-14 DIAGNOSIS — D509 Iron deficiency anemia, unspecified: Secondary | ICD-10-CM | POA: Diagnosis not present

## 2019-08-14 DIAGNOSIS — N2581 Secondary hyperparathyroidism of renal origin: Secondary | ICD-10-CM | POA: Diagnosis not present

## 2019-08-14 DIAGNOSIS — N186 End stage renal disease: Secondary | ICD-10-CM | POA: Diagnosis not present

## 2019-08-16 DIAGNOSIS — N186 End stage renal disease: Secondary | ICD-10-CM | POA: Diagnosis not present

## 2019-08-16 DIAGNOSIS — Z992 Dependence on renal dialysis: Secondary | ICD-10-CM | POA: Diagnosis not present

## 2019-08-16 DIAGNOSIS — E1129 Type 2 diabetes mellitus with other diabetic kidney complication: Secondary | ICD-10-CM | POA: Diagnosis not present

## 2019-08-17 DIAGNOSIS — D631 Anemia in chronic kidney disease: Secondary | ICD-10-CM | POA: Diagnosis not present

## 2019-08-17 DIAGNOSIS — D509 Iron deficiency anemia, unspecified: Secondary | ICD-10-CM | POA: Diagnosis not present

## 2019-08-17 DIAGNOSIS — E876 Hypokalemia: Secondary | ICD-10-CM | POA: Diagnosis not present

## 2019-08-17 DIAGNOSIS — Z992 Dependence on renal dialysis: Secondary | ICD-10-CM | POA: Diagnosis not present

## 2019-08-17 DIAGNOSIS — N2581 Secondary hyperparathyroidism of renal origin: Secondary | ICD-10-CM | POA: Diagnosis not present

## 2019-08-17 DIAGNOSIS — N186 End stage renal disease: Secondary | ICD-10-CM | POA: Diagnosis not present

## 2019-08-19 DIAGNOSIS — D509 Iron deficiency anemia, unspecified: Secondary | ICD-10-CM | POA: Diagnosis not present

## 2019-08-19 DIAGNOSIS — Z992 Dependence on renal dialysis: Secondary | ICD-10-CM | POA: Diagnosis not present

## 2019-08-19 DIAGNOSIS — N186 End stage renal disease: Secondary | ICD-10-CM | POA: Diagnosis not present

## 2019-08-19 DIAGNOSIS — D631 Anemia in chronic kidney disease: Secondary | ICD-10-CM | POA: Diagnosis not present

## 2019-08-19 DIAGNOSIS — N2581 Secondary hyperparathyroidism of renal origin: Secondary | ICD-10-CM | POA: Diagnosis not present

## 2019-08-19 DIAGNOSIS — E876 Hypokalemia: Secondary | ICD-10-CM | POA: Diagnosis not present

## 2019-08-21 DIAGNOSIS — N186 End stage renal disease: Secondary | ICD-10-CM | POA: Diagnosis not present

## 2019-08-21 DIAGNOSIS — N2581 Secondary hyperparathyroidism of renal origin: Secondary | ICD-10-CM | POA: Diagnosis not present

## 2019-08-21 DIAGNOSIS — Z992 Dependence on renal dialysis: Secondary | ICD-10-CM | POA: Diagnosis not present

## 2019-08-21 DIAGNOSIS — D631 Anemia in chronic kidney disease: Secondary | ICD-10-CM | POA: Diagnosis not present

## 2019-08-21 DIAGNOSIS — D509 Iron deficiency anemia, unspecified: Secondary | ICD-10-CM | POA: Diagnosis not present

## 2019-08-21 DIAGNOSIS — E876 Hypokalemia: Secondary | ICD-10-CM | POA: Diagnosis not present

## 2019-08-24 DIAGNOSIS — E876 Hypokalemia: Secondary | ICD-10-CM | POA: Diagnosis not present

## 2019-08-24 DIAGNOSIS — D509 Iron deficiency anemia, unspecified: Secondary | ICD-10-CM | POA: Diagnosis not present

## 2019-08-24 DIAGNOSIS — N2581 Secondary hyperparathyroidism of renal origin: Secondary | ICD-10-CM | POA: Diagnosis not present

## 2019-08-24 DIAGNOSIS — D631 Anemia in chronic kidney disease: Secondary | ICD-10-CM | POA: Diagnosis not present

## 2019-08-24 DIAGNOSIS — Z992 Dependence on renal dialysis: Secondary | ICD-10-CM | POA: Diagnosis not present

## 2019-08-24 DIAGNOSIS — N186 End stage renal disease: Secondary | ICD-10-CM | POA: Diagnosis not present

## 2019-08-27 DIAGNOSIS — N2581 Secondary hyperparathyroidism of renal origin: Secondary | ICD-10-CM | POA: Diagnosis not present

## 2019-08-27 DIAGNOSIS — N186 End stage renal disease: Secondary | ICD-10-CM | POA: Diagnosis not present

## 2019-08-27 DIAGNOSIS — I871 Compression of vein: Secondary | ICD-10-CM | POA: Diagnosis not present

## 2019-08-27 DIAGNOSIS — Z992 Dependence on renal dialysis: Secondary | ICD-10-CM | POA: Diagnosis not present

## 2019-08-27 DIAGNOSIS — E876 Hypokalemia: Secondary | ICD-10-CM | POA: Diagnosis not present

## 2019-08-27 DIAGNOSIS — D509 Iron deficiency anemia, unspecified: Secondary | ICD-10-CM | POA: Diagnosis not present

## 2019-08-27 DIAGNOSIS — D631 Anemia in chronic kidney disease: Secondary | ICD-10-CM | POA: Diagnosis not present

## 2019-08-27 DIAGNOSIS — T82858A Stenosis of vascular prosthetic devices, implants and grafts, initial encounter: Secondary | ICD-10-CM | POA: Diagnosis not present

## 2019-08-28 DIAGNOSIS — E876 Hypokalemia: Secondary | ICD-10-CM | POA: Diagnosis not present

## 2019-08-28 DIAGNOSIS — D509 Iron deficiency anemia, unspecified: Secondary | ICD-10-CM | POA: Diagnosis not present

## 2019-08-28 DIAGNOSIS — N186 End stage renal disease: Secondary | ICD-10-CM | POA: Diagnosis not present

## 2019-08-28 DIAGNOSIS — N2581 Secondary hyperparathyroidism of renal origin: Secondary | ICD-10-CM | POA: Diagnosis not present

## 2019-08-28 DIAGNOSIS — Z992 Dependence on renal dialysis: Secondary | ICD-10-CM | POA: Diagnosis not present

## 2019-08-28 DIAGNOSIS — D631 Anemia in chronic kidney disease: Secondary | ICD-10-CM | POA: Diagnosis not present

## 2019-08-30 DIAGNOSIS — M179 Osteoarthritis of knee, unspecified: Secondary | ICD-10-CM | POA: Diagnosis not present

## 2019-08-30 DIAGNOSIS — Z0001 Encounter for general adult medical examination with abnormal findings: Secondary | ICD-10-CM | POA: Diagnosis not present

## 2019-08-30 DIAGNOSIS — Z992 Dependence on renal dialysis: Secondary | ICD-10-CM | POA: Diagnosis not present

## 2019-08-30 DIAGNOSIS — E0840 Diabetes mellitus due to underlying condition with diabetic neuropathy, unspecified: Secondary | ICD-10-CM | POA: Diagnosis not present

## 2019-08-30 DIAGNOSIS — I2510 Atherosclerotic heart disease of native coronary artery without angina pectoris: Secondary | ICD-10-CM | POA: Diagnosis not present

## 2019-08-30 DIAGNOSIS — E1165 Type 2 diabetes mellitus with hyperglycemia: Secondary | ICD-10-CM | POA: Diagnosis not present

## 2019-08-30 DIAGNOSIS — I25111 Atherosclerotic heart disease of native coronary artery with angina pectoris with documented spasm: Secondary | ICD-10-CM | POA: Diagnosis not present

## 2019-08-30 DIAGNOSIS — J45991 Cough variant asthma: Secondary | ICD-10-CM | POA: Diagnosis not present

## 2019-08-30 DIAGNOSIS — E781 Pure hyperglyceridemia: Secondary | ICD-10-CM | POA: Diagnosis not present

## 2019-08-30 DIAGNOSIS — E1121 Type 2 diabetes mellitus with diabetic nephropathy: Secondary | ICD-10-CM | POA: Diagnosis not present

## 2019-08-30 DIAGNOSIS — N184 Chronic kidney disease, stage 4 (severe): Secondary | ICD-10-CM | POA: Diagnosis not present

## 2019-08-30 DIAGNOSIS — D631 Anemia in chronic kidney disease: Secondary | ICD-10-CM | POA: Diagnosis not present

## 2019-08-30 DIAGNOSIS — Z1389 Encounter for screening for other disorder: Secondary | ICD-10-CM | POA: Diagnosis not present

## 2019-08-30 DIAGNOSIS — E11319 Type 2 diabetes mellitus with unspecified diabetic retinopathy without macular edema: Secondary | ICD-10-CM | POA: Diagnosis not present

## 2019-08-30 DIAGNOSIS — E782 Mixed hyperlipidemia: Secondary | ICD-10-CM | POA: Diagnosis not present

## 2019-08-30 DIAGNOSIS — I10 Essential (primary) hypertension: Secondary | ICD-10-CM | POA: Diagnosis not present

## 2019-08-30 DIAGNOSIS — E1140 Type 2 diabetes mellitus with diabetic neuropathy, unspecified: Secondary | ICD-10-CM | POA: Diagnosis not present

## 2019-08-31 DIAGNOSIS — D631 Anemia in chronic kidney disease: Secondary | ICD-10-CM | POA: Diagnosis not present

## 2019-08-31 DIAGNOSIS — N2581 Secondary hyperparathyroidism of renal origin: Secondary | ICD-10-CM | POA: Diagnosis not present

## 2019-08-31 DIAGNOSIS — D509 Iron deficiency anemia, unspecified: Secondary | ICD-10-CM | POA: Diagnosis not present

## 2019-08-31 DIAGNOSIS — E876 Hypokalemia: Secondary | ICD-10-CM | POA: Diagnosis not present

## 2019-08-31 DIAGNOSIS — Z992 Dependence on renal dialysis: Secondary | ICD-10-CM | POA: Diagnosis not present

## 2019-08-31 DIAGNOSIS — N186 End stage renal disease: Secondary | ICD-10-CM | POA: Diagnosis not present

## 2019-09-03 DIAGNOSIS — D509 Iron deficiency anemia, unspecified: Secondary | ICD-10-CM | POA: Diagnosis not present

## 2019-09-03 DIAGNOSIS — D631 Anemia in chronic kidney disease: Secondary | ICD-10-CM | POA: Diagnosis not present

## 2019-09-03 DIAGNOSIS — N2581 Secondary hyperparathyroidism of renal origin: Secondary | ICD-10-CM | POA: Diagnosis not present

## 2019-09-03 DIAGNOSIS — Z992 Dependence on renal dialysis: Secondary | ICD-10-CM | POA: Diagnosis not present

## 2019-09-03 DIAGNOSIS — N186 End stage renal disease: Secondary | ICD-10-CM | POA: Diagnosis not present

## 2019-09-03 DIAGNOSIS — E876 Hypokalemia: Secondary | ICD-10-CM | POA: Diagnosis not present

## 2019-09-04 DIAGNOSIS — D631 Anemia in chronic kidney disease: Secondary | ICD-10-CM | POA: Diagnosis not present

## 2019-09-04 DIAGNOSIS — Z992 Dependence on renal dialysis: Secondary | ICD-10-CM | POA: Diagnosis not present

## 2019-09-04 DIAGNOSIS — E876 Hypokalemia: Secondary | ICD-10-CM | POA: Diagnosis not present

## 2019-09-04 DIAGNOSIS — D509 Iron deficiency anemia, unspecified: Secondary | ICD-10-CM | POA: Diagnosis not present

## 2019-09-04 DIAGNOSIS — N186 End stage renal disease: Secondary | ICD-10-CM | POA: Diagnosis not present

## 2019-09-04 DIAGNOSIS — N2581 Secondary hyperparathyroidism of renal origin: Secondary | ICD-10-CM | POA: Diagnosis not present

## 2019-09-07 DIAGNOSIS — N2581 Secondary hyperparathyroidism of renal origin: Secondary | ICD-10-CM | POA: Diagnosis not present

## 2019-09-07 DIAGNOSIS — D509 Iron deficiency anemia, unspecified: Secondary | ICD-10-CM | POA: Diagnosis not present

## 2019-09-07 DIAGNOSIS — Z992 Dependence on renal dialysis: Secondary | ICD-10-CM | POA: Diagnosis not present

## 2019-09-07 DIAGNOSIS — D631 Anemia in chronic kidney disease: Secondary | ICD-10-CM | POA: Diagnosis not present

## 2019-09-07 DIAGNOSIS — E876 Hypokalemia: Secondary | ICD-10-CM | POA: Diagnosis not present

## 2019-09-07 DIAGNOSIS — N186 End stage renal disease: Secondary | ICD-10-CM | POA: Diagnosis not present

## 2019-09-09 DIAGNOSIS — N186 End stage renal disease: Secondary | ICD-10-CM | POA: Diagnosis not present

## 2019-09-09 DIAGNOSIS — Z992 Dependence on renal dialysis: Secondary | ICD-10-CM | POA: Diagnosis not present

## 2019-09-09 DIAGNOSIS — E876 Hypokalemia: Secondary | ICD-10-CM | POA: Diagnosis not present

## 2019-09-09 DIAGNOSIS — D631 Anemia in chronic kidney disease: Secondary | ICD-10-CM | POA: Diagnosis not present

## 2019-09-09 DIAGNOSIS — N2581 Secondary hyperparathyroidism of renal origin: Secondary | ICD-10-CM | POA: Diagnosis not present

## 2019-09-09 DIAGNOSIS — D509 Iron deficiency anemia, unspecified: Secondary | ICD-10-CM | POA: Diagnosis not present

## 2019-09-11 DIAGNOSIS — Z992 Dependence on renal dialysis: Secondary | ICD-10-CM | POA: Diagnosis not present

## 2019-09-11 DIAGNOSIS — D509 Iron deficiency anemia, unspecified: Secondary | ICD-10-CM | POA: Diagnosis not present

## 2019-09-11 DIAGNOSIS — E876 Hypokalemia: Secondary | ICD-10-CM | POA: Diagnosis not present

## 2019-09-11 DIAGNOSIS — D631 Anemia in chronic kidney disease: Secondary | ICD-10-CM | POA: Diagnosis not present

## 2019-09-11 DIAGNOSIS — N186 End stage renal disease: Secondary | ICD-10-CM | POA: Diagnosis not present

## 2019-09-11 DIAGNOSIS — N2581 Secondary hyperparathyroidism of renal origin: Secondary | ICD-10-CM | POA: Diagnosis not present

## 2019-09-13 DIAGNOSIS — Z992 Dependence on renal dialysis: Secondary | ICD-10-CM | POA: Diagnosis not present

## 2019-09-13 DIAGNOSIS — N186 End stage renal disease: Secondary | ICD-10-CM | POA: Diagnosis not present

## 2019-09-13 DIAGNOSIS — E1129 Type 2 diabetes mellitus with other diabetic kidney complication: Secondary | ICD-10-CM | POA: Diagnosis not present

## 2019-09-14 DIAGNOSIS — N2581 Secondary hyperparathyroidism of renal origin: Secondary | ICD-10-CM | POA: Diagnosis not present

## 2019-09-14 DIAGNOSIS — D631 Anemia in chronic kidney disease: Secondary | ICD-10-CM | POA: Diagnosis not present

## 2019-09-14 DIAGNOSIS — N186 End stage renal disease: Secondary | ICD-10-CM | POA: Diagnosis not present

## 2019-09-14 DIAGNOSIS — E876 Hypokalemia: Secondary | ICD-10-CM | POA: Diagnosis not present

## 2019-09-14 DIAGNOSIS — Z992 Dependence on renal dialysis: Secondary | ICD-10-CM | POA: Diagnosis not present

## 2019-09-16 DIAGNOSIS — N186 End stage renal disease: Secondary | ICD-10-CM | POA: Diagnosis not present

## 2019-09-16 DIAGNOSIS — D631 Anemia in chronic kidney disease: Secondary | ICD-10-CM | POA: Diagnosis not present

## 2019-09-16 DIAGNOSIS — Z992 Dependence on renal dialysis: Secondary | ICD-10-CM | POA: Diagnosis not present

## 2019-09-16 DIAGNOSIS — N2581 Secondary hyperparathyroidism of renal origin: Secondary | ICD-10-CM | POA: Diagnosis not present

## 2019-09-16 DIAGNOSIS — E876 Hypokalemia: Secondary | ICD-10-CM | POA: Diagnosis not present

## 2019-09-18 DIAGNOSIS — E876 Hypokalemia: Secondary | ICD-10-CM | POA: Diagnosis not present

## 2019-09-18 DIAGNOSIS — N186 End stage renal disease: Secondary | ICD-10-CM | POA: Diagnosis not present

## 2019-09-18 DIAGNOSIS — Z992 Dependence on renal dialysis: Secondary | ICD-10-CM | POA: Diagnosis not present

## 2019-09-18 DIAGNOSIS — D631 Anemia in chronic kidney disease: Secondary | ICD-10-CM | POA: Diagnosis not present

## 2019-09-18 DIAGNOSIS — N2581 Secondary hyperparathyroidism of renal origin: Secondary | ICD-10-CM | POA: Diagnosis not present

## 2019-09-21 DIAGNOSIS — E876 Hypokalemia: Secondary | ICD-10-CM | POA: Diagnosis not present

## 2019-09-21 DIAGNOSIS — N186 End stage renal disease: Secondary | ICD-10-CM | POA: Diagnosis not present

## 2019-09-21 DIAGNOSIS — N2581 Secondary hyperparathyroidism of renal origin: Secondary | ICD-10-CM | POA: Diagnosis not present

## 2019-09-21 DIAGNOSIS — Z992 Dependence on renal dialysis: Secondary | ICD-10-CM | POA: Diagnosis not present

## 2019-09-21 DIAGNOSIS — D631 Anemia in chronic kidney disease: Secondary | ICD-10-CM | POA: Diagnosis not present

## 2019-09-23 DIAGNOSIS — N186 End stage renal disease: Secondary | ICD-10-CM | POA: Diagnosis not present

## 2019-09-23 DIAGNOSIS — E876 Hypokalemia: Secondary | ICD-10-CM | POA: Diagnosis not present

## 2019-09-23 DIAGNOSIS — D631 Anemia in chronic kidney disease: Secondary | ICD-10-CM | POA: Diagnosis not present

## 2019-09-23 DIAGNOSIS — Z992 Dependence on renal dialysis: Secondary | ICD-10-CM | POA: Diagnosis not present

## 2019-09-23 DIAGNOSIS — N2581 Secondary hyperparathyroidism of renal origin: Secondary | ICD-10-CM | POA: Diagnosis not present

## 2019-09-25 DIAGNOSIS — Z992 Dependence on renal dialysis: Secondary | ICD-10-CM | POA: Diagnosis not present

## 2019-09-25 DIAGNOSIS — E876 Hypokalemia: Secondary | ICD-10-CM | POA: Diagnosis not present

## 2019-09-25 DIAGNOSIS — D631 Anemia in chronic kidney disease: Secondary | ICD-10-CM | POA: Diagnosis not present

## 2019-09-25 DIAGNOSIS — N2581 Secondary hyperparathyroidism of renal origin: Secondary | ICD-10-CM | POA: Diagnosis not present

## 2019-09-25 DIAGNOSIS — N186 End stage renal disease: Secondary | ICD-10-CM | POA: Diagnosis not present

## 2019-09-27 DIAGNOSIS — H401131 Primary open-angle glaucoma, bilateral, mild stage: Secondary | ICD-10-CM | POA: Diagnosis not present

## 2019-09-28 DIAGNOSIS — N186 End stage renal disease: Secondary | ICD-10-CM | POA: Diagnosis not present

## 2019-09-28 DIAGNOSIS — D631 Anemia in chronic kidney disease: Secondary | ICD-10-CM | POA: Diagnosis not present

## 2019-09-28 DIAGNOSIS — Z992 Dependence on renal dialysis: Secondary | ICD-10-CM | POA: Diagnosis not present

## 2019-09-28 DIAGNOSIS — N2581 Secondary hyperparathyroidism of renal origin: Secondary | ICD-10-CM | POA: Diagnosis not present

## 2019-09-28 DIAGNOSIS — E876 Hypokalemia: Secondary | ICD-10-CM | POA: Diagnosis not present

## 2019-09-29 DIAGNOSIS — E781 Pure hyperglyceridemia: Secondary | ICD-10-CM | POA: Diagnosis not present

## 2019-09-29 DIAGNOSIS — E1121 Type 2 diabetes mellitus with diabetic nephropathy: Secondary | ICD-10-CM | POA: Diagnosis not present

## 2019-09-29 DIAGNOSIS — I10 Essential (primary) hypertension: Secondary | ICD-10-CM | POA: Diagnosis not present

## 2019-09-29 DIAGNOSIS — E1165 Type 2 diabetes mellitus with hyperglycemia: Secondary | ICD-10-CM | POA: Diagnosis not present

## 2019-09-29 DIAGNOSIS — E11319 Type 2 diabetes mellitus with unspecified diabetic retinopathy without macular edema: Secondary | ICD-10-CM | POA: Diagnosis not present

## 2019-09-29 DIAGNOSIS — N184 Chronic kidney disease, stage 4 (severe): Secondary | ICD-10-CM | POA: Diagnosis not present

## 2019-09-29 DIAGNOSIS — E0840 Diabetes mellitus due to underlying condition with diabetic neuropathy, unspecified: Secondary | ICD-10-CM | POA: Diagnosis not present

## 2019-09-29 DIAGNOSIS — E7800 Pure hypercholesterolemia, unspecified: Secondary | ICD-10-CM | POA: Diagnosis not present

## 2019-09-29 DIAGNOSIS — I25111 Atherosclerotic heart disease of native coronary artery with angina pectoris with documented spasm: Secondary | ICD-10-CM | POA: Diagnosis not present

## 2019-09-29 DIAGNOSIS — D631 Anemia in chronic kidney disease: Secondary | ICD-10-CM | POA: Diagnosis not present

## 2019-09-29 DIAGNOSIS — E1140 Type 2 diabetes mellitus with diabetic neuropathy, unspecified: Secondary | ICD-10-CM | POA: Diagnosis not present

## 2019-09-29 DIAGNOSIS — E782 Mixed hyperlipidemia: Secondary | ICD-10-CM | POA: Diagnosis not present

## 2019-09-30 DIAGNOSIS — N186 End stage renal disease: Secondary | ICD-10-CM | POA: Diagnosis not present

## 2019-09-30 DIAGNOSIS — E876 Hypokalemia: Secondary | ICD-10-CM | POA: Diagnosis not present

## 2019-09-30 DIAGNOSIS — D631 Anemia in chronic kidney disease: Secondary | ICD-10-CM | POA: Diagnosis not present

## 2019-09-30 DIAGNOSIS — Z992 Dependence on renal dialysis: Secondary | ICD-10-CM | POA: Diagnosis not present

## 2019-09-30 DIAGNOSIS — N2581 Secondary hyperparathyroidism of renal origin: Secondary | ICD-10-CM | POA: Diagnosis not present

## 2019-10-02 DIAGNOSIS — Z992 Dependence on renal dialysis: Secondary | ICD-10-CM | POA: Diagnosis not present

## 2019-10-02 DIAGNOSIS — D631 Anemia in chronic kidney disease: Secondary | ICD-10-CM | POA: Diagnosis not present

## 2019-10-02 DIAGNOSIS — E876 Hypokalemia: Secondary | ICD-10-CM | POA: Diagnosis not present

## 2019-10-02 DIAGNOSIS — N186 End stage renal disease: Secondary | ICD-10-CM | POA: Diagnosis not present

## 2019-10-02 DIAGNOSIS — N2581 Secondary hyperparathyroidism of renal origin: Secondary | ICD-10-CM | POA: Diagnosis not present

## 2019-10-05 DIAGNOSIS — N186 End stage renal disease: Secondary | ICD-10-CM | POA: Diagnosis not present

## 2019-10-05 DIAGNOSIS — E876 Hypokalemia: Secondary | ICD-10-CM | POA: Diagnosis not present

## 2019-10-05 DIAGNOSIS — D631 Anemia in chronic kidney disease: Secondary | ICD-10-CM | POA: Diagnosis not present

## 2019-10-05 DIAGNOSIS — Z992 Dependence on renal dialysis: Secondary | ICD-10-CM | POA: Diagnosis not present

## 2019-10-05 DIAGNOSIS — N2581 Secondary hyperparathyroidism of renal origin: Secondary | ICD-10-CM | POA: Diagnosis not present

## 2019-10-09 DIAGNOSIS — Z992 Dependence on renal dialysis: Secondary | ICD-10-CM | POA: Diagnosis not present

## 2019-10-09 DIAGNOSIS — N2581 Secondary hyperparathyroidism of renal origin: Secondary | ICD-10-CM | POA: Diagnosis not present

## 2019-10-09 DIAGNOSIS — E876 Hypokalemia: Secondary | ICD-10-CM | POA: Diagnosis not present

## 2019-10-09 DIAGNOSIS — N186 End stage renal disease: Secondary | ICD-10-CM | POA: Diagnosis not present

## 2019-10-09 DIAGNOSIS — D631 Anemia in chronic kidney disease: Secondary | ICD-10-CM | POA: Diagnosis not present

## 2019-10-12 DIAGNOSIS — N2581 Secondary hyperparathyroidism of renal origin: Secondary | ICD-10-CM | POA: Diagnosis not present

## 2019-10-12 DIAGNOSIS — E876 Hypokalemia: Secondary | ICD-10-CM | POA: Diagnosis not present

## 2019-10-12 DIAGNOSIS — N186 End stage renal disease: Secondary | ICD-10-CM | POA: Diagnosis not present

## 2019-10-12 DIAGNOSIS — Z992 Dependence on renal dialysis: Secondary | ICD-10-CM | POA: Diagnosis not present

## 2019-10-12 DIAGNOSIS — D631 Anemia in chronic kidney disease: Secondary | ICD-10-CM | POA: Diagnosis not present

## 2019-10-14 DIAGNOSIS — E876 Hypokalemia: Secondary | ICD-10-CM | POA: Diagnosis not present

## 2019-10-14 DIAGNOSIS — D631 Anemia in chronic kidney disease: Secondary | ICD-10-CM | POA: Diagnosis not present

## 2019-10-14 DIAGNOSIS — Z992 Dependence on renal dialysis: Secondary | ICD-10-CM | POA: Diagnosis not present

## 2019-10-14 DIAGNOSIS — E1129 Type 2 diabetes mellitus with other diabetic kidney complication: Secondary | ICD-10-CM | POA: Diagnosis not present

## 2019-10-14 DIAGNOSIS — N2581 Secondary hyperparathyroidism of renal origin: Secondary | ICD-10-CM | POA: Diagnosis not present

## 2019-10-14 DIAGNOSIS — N186 End stage renal disease: Secondary | ICD-10-CM | POA: Diagnosis not present

## 2019-10-16 DIAGNOSIS — Z992 Dependence on renal dialysis: Secondary | ICD-10-CM | POA: Diagnosis not present

## 2019-10-16 DIAGNOSIS — N2581 Secondary hyperparathyroidism of renal origin: Secondary | ICD-10-CM | POA: Diagnosis not present

## 2019-10-16 DIAGNOSIS — N186 End stage renal disease: Secondary | ICD-10-CM | POA: Diagnosis not present

## 2019-10-16 DIAGNOSIS — E876 Hypokalemia: Secondary | ICD-10-CM | POA: Diagnosis not present

## 2019-10-16 DIAGNOSIS — D631 Anemia in chronic kidney disease: Secondary | ICD-10-CM | POA: Diagnosis not present

## 2019-10-18 DIAGNOSIS — N186 End stage renal disease: Secondary | ICD-10-CM | POA: Diagnosis not present

## 2019-10-18 DIAGNOSIS — I871 Compression of vein: Secondary | ICD-10-CM | POA: Diagnosis not present

## 2019-10-18 DIAGNOSIS — Z992 Dependence on renal dialysis: Secondary | ICD-10-CM | POA: Diagnosis not present

## 2019-10-18 DIAGNOSIS — T82858A Stenosis of vascular prosthetic devices, implants and grafts, initial encounter: Secondary | ICD-10-CM | POA: Diagnosis not present

## 2019-10-19 DIAGNOSIS — N2581 Secondary hyperparathyroidism of renal origin: Secondary | ICD-10-CM | POA: Diagnosis not present

## 2019-10-19 DIAGNOSIS — Z992 Dependence on renal dialysis: Secondary | ICD-10-CM | POA: Diagnosis not present

## 2019-10-19 DIAGNOSIS — D631 Anemia in chronic kidney disease: Secondary | ICD-10-CM | POA: Diagnosis not present

## 2019-10-19 DIAGNOSIS — E876 Hypokalemia: Secondary | ICD-10-CM | POA: Diagnosis not present

## 2019-10-19 DIAGNOSIS — N186 End stage renal disease: Secondary | ICD-10-CM | POA: Diagnosis not present

## 2019-10-21 DIAGNOSIS — N2581 Secondary hyperparathyroidism of renal origin: Secondary | ICD-10-CM | POA: Diagnosis not present

## 2019-10-21 DIAGNOSIS — Z992 Dependence on renal dialysis: Secondary | ICD-10-CM | POA: Diagnosis not present

## 2019-10-21 DIAGNOSIS — N186 End stage renal disease: Secondary | ICD-10-CM | POA: Diagnosis not present

## 2019-10-21 DIAGNOSIS — E876 Hypokalemia: Secondary | ICD-10-CM | POA: Diagnosis not present

## 2019-10-21 DIAGNOSIS — D631 Anemia in chronic kidney disease: Secondary | ICD-10-CM | POA: Diagnosis not present

## 2019-10-23 DIAGNOSIS — D631 Anemia in chronic kidney disease: Secondary | ICD-10-CM | POA: Diagnosis not present

## 2019-10-23 DIAGNOSIS — Z992 Dependence on renal dialysis: Secondary | ICD-10-CM | POA: Diagnosis not present

## 2019-10-23 DIAGNOSIS — E876 Hypokalemia: Secondary | ICD-10-CM | POA: Diagnosis not present

## 2019-10-23 DIAGNOSIS — N2581 Secondary hyperparathyroidism of renal origin: Secondary | ICD-10-CM | POA: Diagnosis not present

## 2019-10-23 DIAGNOSIS — N186 End stage renal disease: Secondary | ICD-10-CM | POA: Diagnosis not present

## 2019-10-26 DIAGNOSIS — D631 Anemia in chronic kidney disease: Secondary | ICD-10-CM | POA: Diagnosis not present

## 2019-10-26 DIAGNOSIS — N2581 Secondary hyperparathyroidism of renal origin: Secondary | ICD-10-CM | POA: Diagnosis not present

## 2019-10-26 DIAGNOSIS — Z992 Dependence on renal dialysis: Secondary | ICD-10-CM | POA: Diagnosis not present

## 2019-10-26 DIAGNOSIS — N186 End stage renal disease: Secondary | ICD-10-CM | POA: Diagnosis not present

## 2019-10-26 DIAGNOSIS — E876 Hypokalemia: Secondary | ICD-10-CM | POA: Diagnosis not present

## 2019-10-28 DIAGNOSIS — D631 Anemia in chronic kidney disease: Secondary | ICD-10-CM | POA: Diagnosis not present

## 2019-10-28 DIAGNOSIS — Z992 Dependence on renal dialysis: Secondary | ICD-10-CM | POA: Diagnosis not present

## 2019-10-28 DIAGNOSIS — N186 End stage renal disease: Secondary | ICD-10-CM | POA: Diagnosis not present

## 2019-10-28 DIAGNOSIS — E0869 Diabetes mellitus due to underlying condition with other specified complication: Secondary | ICD-10-CM | POA: Diagnosis not present

## 2019-10-28 DIAGNOSIS — N2581 Secondary hyperparathyroidism of renal origin: Secondary | ICD-10-CM | POA: Diagnosis not present

## 2019-10-28 DIAGNOSIS — E876 Hypokalemia: Secondary | ICD-10-CM | POA: Diagnosis not present

## 2019-10-30 DIAGNOSIS — E876 Hypokalemia: Secondary | ICD-10-CM | POA: Diagnosis not present

## 2019-10-30 DIAGNOSIS — Z992 Dependence on renal dialysis: Secondary | ICD-10-CM | POA: Diagnosis not present

## 2019-10-30 DIAGNOSIS — N186 End stage renal disease: Secondary | ICD-10-CM | POA: Diagnosis not present

## 2019-10-30 DIAGNOSIS — D631 Anemia in chronic kidney disease: Secondary | ICD-10-CM | POA: Diagnosis not present

## 2019-10-30 DIAGNOSIS — N2581 Secondary hyperparathyroidism of renal origin: Secondary | ICD-10-CM | POA: Diagnosis not present

## 2019-11-02 DIAGNOSIS — N186 End stage renal disease: Secondary | ICD-10-CM | POA: Diagnosis not present

## 2019-11-02 DIAGNOSIS — E876 Hypokalemia: Secondary | ICD-10-CM | POA: Diagnosis not present

## 2019-11-02 DIAGNOSIS — Z992 Dependence on renal dialysis: Secondary | ICD-10-CM | POA: Diagnosis not present

## 2019-11-02 DIAGNOSIS — N2581 Secondary hyperparathyroidism of renal origin: Secondary | ICD-10-CM | POA: Diagnosis not present

## 2019-11-02 DIAGNOSIS — D631 Anemia in chronic kidney disease: Secondary | ICD-10-CM | POA: Diagnosis not present

## 2019-11-04 DIAGNOSIS — E876 Hypokalemia: Secondary | ICD-10-CM | POA: Diagnosis not present

## 2019-11-04 DIAGNOSIS — N2581 Secondary hyperparathyroidism of renal origin: Secondary | ICD-10-CM | POA: Diagnosis not present

## 2019-11-04 DIAGNOSIS — D631 Anemia in chronic kidney disease: Secondary | ICD-10-CM | POA: Diagnosis not present

## 2019-11-04 DIAGNOSIS — N186 End stage renal disease: Secondary | ICD-10-CM | POA: Diagnosis not present

## 2019-11-04 DIAGNOSIS — Z992 Dependence on renal dialysis: Secondary | ICD-10-CM | POA: Diagnosis not present

## 2019-11-06 DIAGNOSIS — Z992 Dependence on renal dialysis: Secondary | ICD-10-CM | POA: Diagnosis not present

## 2019-11-06 DIAGNOSIS — N186 End stage renal disease: Secondary | ICD-10-CM | POA: Diagnosis not present

## 2019-11-06 DIAGNOSIS — D631 Anemia in chronic kidney disease: Secondary | ICD-10-CM | POA: Diagnosis not present

## 2019-11-06 DIAGNOSIS — N2581 Secondary hyperparathyroidism of renal origin: Secondary | ICD-10-CM | POA: Diagnosis not present

## 2019-11-06 DIAGNOSIS — E876 Hypokalemia: Secondary | ICD-10-CM | POA: Diagnosis not present

## 2019-11-09 DIAGNOSIS — N2581 Secondary hyperparathyroidism of renal origin: Secondary | ICD-10-CM | POA: Diagnosis not present

## 2019-11-09 DIAGNOSIS — Z992 Dependence on renal dialysis: Secondary | ICD-10-CM | POA: Diagnosis not present

## 2019-11-09 DIAGNOSIS — D631 Anemia in chronic kidney disease: Secondary | ICD-10-CM | POA: Diagnosis not present

## 2019-11-09 DIAGNOSIS — E876 Hypokalemia: Secondary | ICD-10-CM | POA: Diagnosis not present

## 2019-11-09 DIAGNOSIS — N186 End stage renal disease: Secondary | ICD-10-CM | POA: Diagnosis not present

## 2019-11-11 DIAGNOSIS — D631 Anemia in chronic kidney disease: Secondary | ICD-10-CM | POA: Diagnosis not present

## 2019-11-11 DIAGNOSIS — N2581 Secondary hyperparathyroidism of renal origin: Secondary | ICD-10-CM | POA: Diagnosis not present

## 2019-11-11 DIAGNOSIS — Z992 Dependence on renal dialysis: Secondary | ICD-10-CM | POA: Diagnosis not present

## 2019-11-11 DIAGNOSIS — E876 Hypokalemia: Secondary | ICD-10-CM | POA: Diagnosis not present

## 2019-11-11 DIAGNOSIS — N186 End stage renal disease: Secondary | ICD-10-CM | POA: Diagnosis not present

## 2019-11-12 DIAGNOSIS — E781 Pure hyperglyceridemia: Secondary | ICD-10-CM | POA: Diagnosis not present

## 2019-11-12 DIAGNOSIS — E1121 Type 2 diabetes mellitus with diabetic nephropathy: Secondary | ICD-10-CM | POA: Diagnosis not present

## 2019-11-12 DIAGNOSIS — I10 Essential (primary) hypertension: Secondary | ICD-10-CM | POA: Diagnosis not present

## 2019-11-12 DIAGNOSIS — E782 Mixed hyperlipidemia: Secondary | ICD-10-CM | POA: Diagnosis not present

## 2019-11-12 DIAGNOSIS — I2510 Atherosclerotic heart disease of native coronary artery without angina pectoris: Secondary | ICD-10-CM | POA: Diagnosis not present

## 2019-11-12 DIAGNOSIS — M179 Osteoarthritis of knee, unspecified: Secondary | ICD-10-CM | POA: Diagnosis not present

## 2019-11-12 DIAGNOSIS — N184 Chronic kidney disease, stage 4 (severe): Secondary | ICD-10-CM | POA: Diagnosis not present

## 2019-11-12 DIAGNOSIS — E1140 Type 2 diabetes mellitus with diabetic neuropathy, unspecified: Secondary | ICD-10-CM | POA: Diagnosis not present

## 2019-11-12 DIAGNOSIS — E1165 Type 2 diabetes mellitus with hyperglycemia: Secondary | ICD-10-CM | POA: Diagnosis not present

## 2019-11-12 DIAGNOSIS — E11319 Type 2 diabetes mellitus with unspecified diabetic retinopathy without macular edema: Secondary | ICD-10-CM | POA: Diagnosis not present

## 2019-11-12 DIAGNOSIS — M158 Other polyosteoarthritis: Secondary | ICD-10-CM | POA: Diagnosis not present

## 2019-11-12 DIAGNOSIS — D631 Anemia in chronic kidney disease: Secondary | ICD-10-CM | POA: Diagnosis not present

## 2019-11-13 DIAGNOSIS — E1129 Type 2 diabetes mellitus with other diabetic kidney complication: Secondary | ICD-10-CM | POA: Diagnosis not present

## 2019-11-13 DIAGNOSIS — Z992 Dependence on renal dialysis: Secondary | ICD-10-CM | POA: Diagnosis not present

## 2019-11-13 DIAGNOSIS — D631 Anemia in chronic kidney disease: Secondary | ICD-10-CM | POA: Diagnosis not present

## 2019-11-13 DIAGNOSIS — E876 Hypokalemia: Secondary | ICD-10-CM | POA: Diagnosis not present

## 2019-11-13 DIAGNOSIS — N2581 Secondary hyperparathyroidism of renal origin: Secondary | ICD-10-CM | POA: Diagnosis not present

## 2019-11-13 DIAGNOSIS — Z23 Encounter for immunization: Secondary | ICD-10-CM | POA: Diagnosis not present

## 2019-11-13 DIAGNOSIS — N186 End stage renal disease: Secondary | ICD-10-CM | POA: Diagnosis not present

## 2019-11-16 DIAGNOSIS — Z23 Encounter for immunization: Secondary | ICD-10-CM | POA: Diagnosis not present

## 2019-11-16 DIAGNOSIS — D631 Anemia in chronic kidney disease: Secondary | ICD-10-CM | POA: Diagnosis not present

## 2019-11-16 DIAGNOSIS — N2581 Secondary hyperparathyroidism of renal origin: Secondary | ICD-10-CM | POA: Diagnosis not present

## 2019-11-16 DIAGNOSIS — E876 Hypokalemia: Secondary | ICD-10-CM | POA: Diagnosis not present

## 2019-11-16 DIAGNOSIS — N186 End stage renal disease: Secondary | ICD-10-CM | POA: Diagnosis not present

## 2019-11-16 DIAGNOSIS — Z992 Dependence on renal dialysis: Secondary | ICD-10-CM | POA: Diagnosis not present

## 2019-11-18 DIAGNOSIS — N2581 Secondary hyperparathyroidism of renal origin: Secondary | ICD-10-CM | POA: Diagnosis not present

## 2019-11-18 DIAGNOSIS — E876 Hypokalemia: Secondary | ICD-10-CM | POA: Diagnosis not present

## 2019-11-18 DIAGNOSIS — D631 Anemia in chronic kidney disease: Secondary | ICD-10-CM | POA: Diagnosis not present

## 2019-11-18 DIAGNOSIS — Z23 Encounter for immunization: Secondary | ICD-10-CM | POA: Diagnosis not present

## 2019-11-18 DIAGNOSIS — Z992 Dependence on renal dialysis: Secondary | ICD-10-CM | POA: Diagnosis not present

## 2019-11-18 DIAGNOSIS — N186 End stage renal disease: Secondary | ICD-10-CM | POA: Diagnosis not present

## 2019-11-20 DIAGNOSIS — Z992 Dependence on renal dialysis: Secondary | ICD-10-CM | POA: Diagnosis not present

## 2019-11-20 DIAGNOSIS — N2581 Secondary hyperparathyroidism of renal origin: Secondary | ICD-10-CM | POA: Diagnosis not present

## 2019-11-20 DIAGNOSIS — E876 Hypokalemia: Secondary | ICD-10-CM | POA: Diagnosis not present

## 2019-11-20 DIAGNOSIS — D631 Anemia in chronic kidney disease: Secondary | ICD-10-CM | POA: Diagnosis not present

## 2019-11-20 DIAGNOSIS — N186 End stage renal disease: Secondary | ICD-10-CM | POA: Diagnosis not present

## 2019-11-20 DIAGNOSIS — Z23 Encounter for immunization: Secondary | ICD-10-CM | POA: Diagnosis not present

## 2019-11-23 DIAGNOSIS — Z23 Encounter for immunization: Secondary | ICD-10-CM | POA: Diagnosis not present

## 2019-11-23 DIAGNOSIS — D631 Anemia in chronic kidney disease: Secondary | ICD-10-CM | POA: Diagnosis not present

## 2019-11-23 DIAGNOSIS — N186 End stage renal disease: Secondary | ICD-10-CM | POA: Diagnosis not present

## 2019-11-23 DIAGNOSIS — N2581 Secondary hyperparathyroidism of renal origin: Secondary | ICD-10-CM | POA: Diagnosis not present

## 2019-11-23 DIAGNOSIS — Z992 Dependence on renal dialysis: Secondary | ICD-10-CM | POA: Diagnosis not present

## 2019-11-23 DIAGNOSIS — E876 Hypokalemia: Secondary | ICD-10-CM | POA: Diagnosis not present

## 2019-11-25 DIAGNOSIS — Z992 Dependence on renal dialysis: Secondary | ICD-10-CM | POA: Diagnosis not present

## 2019-11-25 DIAGNOSIS — Z23 Encounter for immunization: Secondary | ICD-10-CM | POA: Diagnosis not present

## 2019-11-25 DIAGNOSIS — N2581 Secondary hyperparathyroidism of renal origin: Secondary | ICD-10-CM | POA: Diagnosis not present

## 2019-11-25 DIAGNOSIS — E876 Hypokalemia: Secondary | ICD-10-CM | POA: Diagnosis not present

## 2019-11-25 DIAGNOSIS — N186 End stage renal disease: Secondary | ICD-10-CM | POA: Diagnosis not present

## 2019-11-25 DIAGNOSIS — D631 Anemia in chronic kidney disease: Secondary | ICD-10-CM | POA: Diagnosis not present

## 2019-11-27 DIAGNOSIS — E876 Hypokalemia: Secondary | ICD-10-CM | POA: Diagnosis not present

## 2019-11-27 DIAGNOSIS — Z992 Dependence on renal dialysis: Secondary | ICD-10-CM | POA: Diagnosis not present

## 2019-11-27 DIAGNOSIS — N186 End stage renal disease: Secondary | ICD-10-CM | POA: Diagnosis not present

## 2019-11-27 DIAGNOSIS — N2581 Secondary hyperparathyroidism of renal origin: Secondary | ICD-10-CM | POA: Diagnosis not present

## 2019-11-27 DIAGNOSIS — D631 Anemia in chronic kidney disease: Secondary | ICD-10-CM | POA: Diagnosis not present

## 2019-11-27 DIAGNOSIS — Z23 Encounter for immunization: Secondary | ICD-10-CM | POA: Diagnosis not present

## 2019-11-30 DIAGNOSIS — E876 Hypokalemia: Secondary | ICD-10-CM | POA: Diagnosis not present

## 2019-11-30 DIAGNOSIS — N2581 Secondary hyperparathyroidism of renal origin: Secondary | ICD-10-CM | POA: Diagnosis not present

## 2019-11-30 DIAGNOSIS — N186 End stage renal disease: Secondary | ICD-10-CM | POA: Diagnosis not present

## 2019-11-30 DIAGNOSIS — Z23 Encounter for immunization: Secondary | ICD-10-CM | POA: Diagnosis not present

## 2019-11-30 DIAGNOSIS — D631 Anemia in chronic kidney disease: Secondary | ICD-10-CM | POA: Diagnosis not present

## 2019-11-30 DIAGNOSIS — Z992 Dependence on renal dialysis: Secondary | ICD-10-CM | POA: Diagnosis not present

## 2019-12-03 DIAGNOSIS — Z992 Dependence on renal dialysis: Secondary | ICD-10-CM | POA: Diagnosis not present

## 2019-12-03 DIAGNOSIS — T82858A Stenosis of vascular prosthetic devices, implants and grafts, initial encounter: Secondary | ICD-10-CM | POA: Diagnosis not present

## 2019-12-03 DIAGNOSIS — T82868A Thrombosis of vascular prosthetic devices, implants and grafts, initial encounter: Secondary | ICD-10-CM | POA: Diagnosis not present

## 2019-12-03 DIAGNOSIS — N186 End stage renal disease: Secondary | ICD-10-CM | POA: Diagnosis not present

## 2019-12-04 DIAGNOSIS — D631 Anemia in chronic kidney disease: Secondary | ICD-10-CM | POA: Diagnosis not present

## 2019-12-04 DIAGNOSIS — N186 End stage renal disease: Secondary | ICD-10-CM | POA: Diagnosis not present

## 2019-12-04 DIAGNOSIS — Z4802 Encounter for removal of sutures: Secondary | ICD-10-CM | POA: Insufficient documentation

## 2019-12-04 DIAGNOSIS — Z23 Encounter for immunization: Secondary | ICD-10-CM | POA: Diagnosis not present

## 2019-12-04 DIAGNOSIS — Z992 Dependence on renal dialysis: Secondary | ICD-10-CM | POA: Diagnosis not present

## 2019-12-04 DIAGNOSIS — N2581 Secondary hyperparathyroidism of renal origin: Secondary | ICD-10-CM | POA: Diagnosis not present

## 2019-12-04 DIAGNOSIS — E876 Hypokalemia: Secondary | ICD-10-CM | POA: Diagnosis not present

## 2019-12-07 DIAGNOSIS — Z992 Dependence on renal dialysis: Secondary | ICD-10-CM | POA: Diagnosis not present

## 2019-12-07 DIAGNOSIS — Z23 Encounter for immunization: Secondary | ICD-10-CM | POA: Diagnosis not present

## 2019-12-07 DIAGNOSIS — N186 End stage renal disease: Secondary | ICD-10-CM | POA: Diagnosis not present

## 2019-12-07 DIAGNOSIS — D631 Anemia in chronic kidney disease: Secondary | ICD-10-CM | POA: Diagnosis not present

## 2019-12-07 DIAGNOSIS — N2581 Secondary hyperparathyroidism of renal origin: Secondary | ICD-10-CM | POA: Diagnosis not present

## 2019-12-07 DIAGNOSIS — E876 Hypokalemia: Secondary | ICD-10-CM | POA: Diagnosis not present

## 2019-12-09 DIAGNOSIS — E876 Hypokalemia: Secondary | ICD-10-CM | POA: Diagnosis not present

## 2019-12-09 DIAGNOSIS — N2581 Secondary hyperparathyroidism of renal origin: Secondary | ICD-10-CM | POA: Diagnosis not present

## 2019-12-09 DIAGNOSIS — N186 End stage renal disease: Secondary | ICD-10-CM | POA: Diagnosis not present

## 2019-12-09 DIAGNOSIS — D631 Anemia in chronic kidney disease: Secondary | ICD-10-CM | POA: Diagnosis not present

## 2019-12-09 DIAGNOSIS — Z23 Encounter for immunization: Secondary | ICD-10-CM | POA: Diagnosis not present

## 2019-12-09 DIAGNOSIS — Z992 Dependence on renal dialysis: Secondary | ICD-10-CM | POA: Diagnosis not present

## 2019-12-11 DIAGNOSIS — Z992 Dependence on renal dialysis: Secondary | ICD-10-CM | POA: Diagnosis not present

## 2019-12-11 DIAGNOSIS — Z23 Encounter for immunization: Secondary | ICD-10-CM | POA: Diagnosis not present

## 2019-12-11 DIAGNOSIS — N186 End stage renal disease: Secondary | ICD-10-CM | POA: Diagnosis not present

## 2019-12-11 DIAGNOSIS — D631 Anemia in chronic kidney disease: Secondary | ICD-10-CM | POA: Diagnosis not present

## 2019-12-11 DIAGNOSIS — N2581 Secondary hyperparathyroidism of renal origin: Secondary | ICD-10-CM | POA: Diagnosis not present

## 2019-12-11 DIAGNOSIS — E876 Hypokalemia: Secondary | ICD-10-CM | POA: Diagnosis not present

## 2020-01-13 DIAGNOSIS — N186 End stage renal disease: Secondary | ICD-10-CM | POA: Diagnosis not present

## 2020-01-13 DIAGNOSIS — D631 Anemia in chronic kidney disease: Secondary | ICD-10-CM | POA: Diagnosis not present

## 2020-01-13 DIAGNOSIS — N2581 Secondary hyperparathyroidism of renal origin: Secondary | ICD-10-CM | POA: Diagnosis not present

## 2020-01-13 DIAGNOSIS — E876 Hypokalemia: Secondary | ICD-10-CM | POA: Diagnosis not present

## 2020-01-13 DIAGNOSIS — Z992 Dependence on renal dialysis: Secondary | ICD-10-CM | POA: Diagnosis not present

## 2020-01-13 DIAGNOSIS — E1129 Type 2 diabetes mellitus with other diabetic kidney complication: Secondary | ICD-10-CM | POA: Diagnosis not present

## 2020-01-15 DIAGNOSIS — E876 Hypokalemia: Secondary | ICD-10-CM | POA: Diagnosis not present

## 2020-01-15 DIAGNOSIS — Z992 Dependence on renal dialysis: Secondary | ICD-10-CM | POA: Diagnosis not present

## 2020-01-15 DIAGNOSIS — N186 End stage renal disease: Secondary | ICD-10-CM | POA: Diagnosis not present

## 2020-01-15 DIAGNOSIS — D631 Anemia in chronic kidney disease: Secondary | ICD-10-CM | POA: Diagnosis not present

## 2020-01-15 DIAGNOSIS — N2581 Secondary hyperparathyroidism of renal origin: Secondary | ICD-10-CM | POA: Diagnosis not present

## 2020-01-18 DIAGNOSIS — N186 End stage renal disease: Secondary | ICD-10-CM | POA: Diagnosis not present

## 2020-01-18 DIAGNOSIS — D631 Anemia in chronic kidney disease: Secondary | ICD-10-CM | POA: Diagnosis not present

## 2020-01-18 DIAGNOSIS — N2581 Secondary hyperparathyroidism of renal origin: Secondary | ICD-10-CM | POA: Diagnosis not present

## 2020-01-18 DIAGNOSIS — Z992 Dependence on renal dialysis: Secondary | ICD-10-CM | POA: Diagnosis not present

## 2020-01-18 DIAGNOSIS — E876 Hypokalemia: Secondary | ICD-10-CM | POA: Diagnosis not present

## 2020-01-20 DIAGNOSIS — D631 Anemia in chronic kidney disease: Secondary | ICD-10-CM | POA: Diagnosis not present

## 2020-01-20 DIAGNOSIS — N186 End stage renal disease: Secondary | ICD-10-CM | POA: Diagnosis not present

## 2020-01-20 DIAGNOSIS — Z992 Dependence on renal dialysis: Secondary | ICD-10-CM | POA: Diagnosis not present

## 2020-01-20 DIAGNOSIS — N2581 Secondary hyperparathyroidism of renal origin: Secondary | ICD-10-CM | POA: Diagnosis not present

## 2020-01-20 DIAGNOSIS — E876 Hypokalemia: Secondary | ICD-10-CM | POA: Diagnosis not present

## 2020-01-22 DIAGNOSIS — Z992 Dependence on renal dialysis: Secondary | ICD-10-CM | POA: Diagnosis not present

## 2020-01-22 DIAGNOSIS — N2581 Secondary hyperparathyroidism of renal origin: Secondary | ICD-10-CM | POA: Diagnosis not present

## 2020-01-22 DIAGNOSIS — N186 End stage renal disease: Secondary | ICD-10-CM | POA: Diagnosis not present

## 2020-01-22 DIAGNOSIS — D631 Anemia in chronic kidney disease: Secondary | ICD-10-CM | POA: Diagnosis not present

## 2020-01-22 DIAGNOSIS — E876 Hypokalemia: Secondary | ICD-10-CM | POA: Diagnosis not present

## 2020-01-25 DIAGNOSIS — D631 Anemia in chronic kidney disease: Secondary | ICD-10-CM | POA: Diagnosis not present

## 2020-01-25 DIAGNOSIS — Z992 Dependence on renal dialysis: Secondary | ICD-10-CM | POA: Diagnosis not present

## 2020-01-25 DIAGNOSIS — E876 Hypokalemia: Secondary | ICD-10-CM | POA: Diagnosis not present

## 2020-01-25 DIAGNOSIS — N186 End stage renal disease: Secondary | ICD-10-CM | POA: Diagnosis not present

## 2020-01-25 DIAGNOSIS — N2581 Secondary hyperparathyroidism of renal origin: Secondary | ICD-10-CM | POA: Diagnosis not present

## 2020-01-27 DIAGNOSIS — E876 Hypokalemia: Secondary | ICD-10-CM | POA: Diagnosis not present

## 2020-01-27 DIAGNOSIS — Z992 Dependence on renal dialysis: Secondary | ICD-10-CM | POA: Diagnosis not present

## 2020-01-27 DIAGNOSIS — N2581 Secondary hyperparathyroidism of renal origin: Secondary | ICD-10-CM | POA: Diagnosis not present

## 2020-01-27 DIAGNOSIS — N186 End stage renal disease: Secondary | ICD-10-CM | POA: Diagnosis not present

## 2020-01-27 DIAGNOSIS — E0869 Diabetes mellitus due to underlying condition with other specified complication: Secondary | ICD-10-CM | POA: Diagnosis not present

## 2020-01-27 DIAGNOSIS — D631 Anemia in chronic kidney disease: Secondary | ICD-10-CM | POA: Diagnosis not present

## 2020-01-29 DIAGNOSIS — N186 End stage renal disease: Secondary | ICD-10-CM | POA: Diagnosis not present

## 2020-01-29 DIAGNOSIS — D631 Anemia in chronic kidney disease: Secondary | ICD-10-CM | POA: Diagnosis not present

## 2020-01-29 DIAGNOSIS — N2581 Secondary hyperparathyroidism of renal origin: Secondary | ICD-10-CM | POA: Diagnosis not present

## 2020-01-29 DIAGNOSIS — E876 Hypokalemia: Secondary | ICD-10-CM | POA: Diagnosis not present

## 2020-01-29 DIAGNOSIS — Z992 Dependence on renal dialysis: Secondary | ICD-10-CM | POA: Diagnosis not present

## 2020-01-31 DIAGNOSIS — H401131 Primary open-angle glaucoma, bilateral, mild stage: Secondary | ICD-10-CM | POA: Diagnosis not present

## 2020-01-31 DIAGNOSIS — Z794 Long term (current) use of insulin: Secondary | ICD-10-CM | POA: Diagnosis not present

## 2020-01-31 DIAGNOSIS — E119 Type 2 diabetes mellitus without complications: Secondary | ICD-10-CM | POA: Diagnosis not present

## 2020-02-01 DIAGNOSIS — Z992 Dependence on renal dialysis: Secondary | ICD-10-CM | POA: Diagnosis not present

## 2020-02-01 DIAGNOSIS — N186 End stage renal disease: Secondary | ICD-10-CM | POA: Diagnosis not present

## 2020-02-01 DIAGNOSIS — N2581 Secondary hyperparathyroidism of renal origin: Secondary | ICD-10-CM | POA: Diagnosis not present

## 2020-02-01 DIAGNOSIS — D631 Anemia in chronic kidney disease: Secondary | ICD-10-CM | POA: Diagnosis not present

## 2020-02-01 DIAGNOSIS — E876 Hypokalemia: Secondary | ICD-10-CM | POA: Diagnosis not present

## 2020-02-03 DIAGNOSIS — E876 Hypokalemia: Secondary | ICD-10-CM | POA: Diagnosis not present

## 2020-02-03 DIAGNOSIS — Z992 Dependence on renal dialysis: Secondary | ICD-10-CM | POA: Diagnosis not present

## 2020-02-03 DIAGNOSIS — N186 End stage renal disease: Secondary | ICD-10-CM | POA: Diagnosis not present

## 2020-02-03 DIAGNOSIS — D631 Anemia in chronic kidney disease: Secondary | ICD-10-CM | POA: Diagnosis not present

## 2020-02-03 DIAGNOSIS — N2581 Secondary hyperparathyroidism of renal origin: Secondary | ICD-10-CM | POA: Diagnosis not present

## 2020-02-04 DIAGNOSIS — T82858A Stenosis of vascular prosthetic devices, implants and grafts, initial encounter: Secondary | ICD-10-CM | POA: Diagnosis not present

## 2020-02-04 DIAGNOSIS — Z992 Dependence on renal dialysis: Secondary | ICD-10-CM | POA: Diagnosis not present

## 2020-02-04 DIAGNOSIS — N186 End stage renal disease: Secondary | ICD-10-CM | POA: Diagnosis not present

## 2020-02-05 DIAGNOSIS — E876 Hypokalemia: Secondary | ICD-10-CM | POA: Diagnosis not present

## 2020-02-05 DIAGNOSIS — N186 End stage renal disease: Secondary | ICD-10-CM | POA: Diagnosis not present

## 2020-02-05 DIAGNOSIS — Z992 Dependence on renal dialysis: Secondary | ICD-10-CM | POA: Diagnosis not present

## 2020-02-05 DIAGNOSIS — D631 Anemia in chronic kidney disease: Secondary | ICD-10-CM | POA: Diagnosis not present

## 2020-02-05 DIAGNOSIS — N2581 Secondary hyperparathyroidism of renal origin: Secondary | ICD-10-CM | POA: Diagnosis not present

## 2020-02-08 DIAGNOSIS — N2581 Secondary hyperparathyroidism of renal origin: Secondary | ICD-10-CM | POA: Diagnosis not present

## 2020-02-08 DIAGNOSIS — D631 Anemia in chronic kidney disease: Secondary | ICD-10-CM | POA: Diagnosis not present

## 2020-02-08 DIAGNOSIS — E876 Hypokalemia: Secondary | ICD-10-CM | POA: Diagnosis not present

## 2020-02-08 DIAGNOSIS — N186 End stage renal disease: Secondary | ICD-10-CM | POA: Diagnosis not present

## 2020-02-08 DIAGNOSIS — Z992 Dependence on renal dialysis: Secondary | ICD-10-CM | POA: Diagnosis not present

## 2020-02-10 DIAGNOSIS — N186 End stage renal disease: Secondary | ICD-10-CM | POA: Diagnosis not present

## 2020-02-10 DIAGNOSIS — E876 Hypokalemia: Secondary | ICD-10-CM | POA: Diagnosis not present

## 2020-02-10 DIAGNOSIS — N2581 Secondary hyperparathyroidism of renal origin: Secondary | ICD-10-CM | POA: Diagnosis not present

## 2020-02-10 DIAGNOSIS — D631 Anemia in chronic kidney disease: Secondary | ICD-10-CM | POA: Diagnosis not present

## 2020-02-10 DIAGNOSIS — Z992 Dependence on renal dialysis: Secondary | ICD-10-CM | POA: Diagnosis not present

## 2020-02-12 DIAGNOSIS — N2581 Secondary hyperparathyroidism of renal origin: Secondary | ICD-10-CM | POA: Diagnosis not present

## 2020-02-12 DIAGNOSIS — D631 Anemia in chronic kidney disease: Secondary | ICD-10-CM | POA: Diagnosis not present

## 2020-02-12 DIAGNOSIS — E876 Hypokalemia: Secondary | ICD-10-CM | POA: Diagnosis not present

## 2020-02-12 DIAGNOSIS — Z992 Dependence on renal dialysis: Secondary | ICD-10-CM | POA: Diagnosis not present

## 2020-02-12 DIAGNOSIS — N186 End stage renal disease: Secondary | ICD-10-CM | POA: Diagnosis not present

## 2020-02-13 DIAGNOSIS — N186 End stage renal disease: Secondary | ICD-10-CM | POA: Diagnosis not present

## 2020-02-13 DIAGNOSIS — E1129 Type 2 diabetes mellitus with other diabetic kidney complication: Secondary | ICD-10-CM | POA: Diagnosis not present

## 2020-02-13 DIAGNOSIS — Z992 Dependence on renal dialysis: Secondary | ICD-10-CM | POA: Diagnosis not present

## 2020-02-15 DIAGNOSIS — Z992 Dependence on renal dialysis: Secondary | ICD-10-CM | POA: Diagnosis not present

## 2020-02-15 DIAGNOSIS — N2581 Secondary hyperparathyroidism of renal origin: Secondary | ICD-10-CM | POA: Diagnosis not present

## 2020-02-15 DIAGNOSIS — E876 Hypokalemia: Secondary | ICD-10-CM | POA: Diagnosis not present

## 2020-02-15 DIAGNOSIS — D631 Anemia in chronic kidney disease: Secondary | ICD-10-CM | POA: Diagnosis not present

## 2020-02-15 DIAGNOSIS — N186 End stage renal disease: Secondary | ICD-10-CM | POA: Diagnosis not present

## 2020-02-17 DIAGNOSIS — E876 Hypokalemia: Secondary | ICD-10-CM | POA: Diagnosis not present

## 2020-02-17 DIAGNOSIS — N186 End stage renal disease: Secondary | ICD-10-CM | POA: Diagnosis not present

## 2020-02-17 DIAGNOSIS — D631 Anemia in chronic kidney disease: Secondary | ICD-10-CM | POA: Diagnosis not present

## 2020-02-17 DIAGNOSIS — N2581 Secondary hyperparathyroidism of renal origin: Secondary | ICD-10-CM | POA: Diagnosis not present

## 2020-02-17 DIAGNOSIS — Z992 Dependence on renal dialysis: Secondary | ICD-10-CM | POA: Diagnosis not present

## 2020-02-19 DIAGNOSIS — E876 Hypokalemia: Secondary | ICD-10-CM | POA: Diagnosis not present

## 2020-02-19 DIAGNOSIS — Z992 Dependence on renal dialysis: Secondary | ICD-10-CM | POA: Diagnosis not present

## 2020-02-19 DIAGNOSIS — N2581 Secondary hyperparathyroidism of renal origin: Secondary | ICD-10-CM | POA: Diagnosis not present

## 2020-02-19 DIAGNOSIS — D631 Anemia in chronic kidney disease: Secondary | ICD-10-CM | POA: Diagnosis not present

## 2020-02-19 DIAGNOSIS — N186 End stage renal disease: Secondary | ICD-10-CM | POA: Diagnosis not present

## 2020-02-22 DIAGNOSIS — N2581 Secondary hyperparathyroidism of renal origin: Secondary | ICD-10-CM | POA: Diagnosis not present

## 2020-02-22 DIAGNOSIS — E876 Hypokalemia: Secondary | ICD-10-CM | POA: Diagnosis not present

## 2020-02-22 DIAGNOSIS — N186 End stage renal disease: Secondary | ICD-10-CM | POA: Diagnosis not present

## 2020-02-22 DIAGNOSIS — Z992 Dependence on renal dialysis: Secondary | ICD-10-CM | POA: Diagnosis not present

## 2020-02-22 DIAGNOSIS — D631 Anemia in chronic kidney disease: Secondary | ICD-10-CM | POA: Diagnosis not present

## 2020-02-24 DIAGNOSIS — Z992 Dependence on renal dialysis: Secondary | ICD-10-CM | POA: Diagnosis not present

## 2020-02-24 DIAGNOSIS — E876 Hypokalemia: Secondary | ICD-10-CM | POA: Diagnosis not present

## 2020-02-24 DIAGNOSIS — N2581 Secondary hyperparathyroidism of renal origin: Secondary | ICD-10-CM | POA: Diagnosis not present

## 2020-02-24 DIAGNOSIS — D631 Anemia in chronic kidney disease: Secondary | ICD-10-CM | POA: Diagnosis not present

## 2020-02-24 DIAGNOSIS — N186 End stage renal disease: Secondary | ICD-10-CM | POA: Diagnosis not present

## 2020-02-26 DIAGNOSIS — N186 End stage renal disease: Secondary | ICD-10-CM | POA: Diagnosis not present

## 2020-02-26 DIAGNOSIS — D631 Anemia in chronic kidney disease: Secondary | ICD-10-CM | POA: Diagnosis not present

## 2020-02-26 DIAGNOSIS — E876 Hypokalemia: Secondary | ICD-10-CM | POA: Diagnosis not present

## 2020-02-26 DIAGNOSIS — Z992 Dependence on renal dialysis: Secondary | ICD-10-CM | POA: Diagnosis not present

## 2020-02-26 DIAGNOSIS — N2581 Secondary hyperparathyroidism of renal origin: Secondary | ICD-10-CM | POA: Diagnosis not present

## 2020-02-28 ENCOUNTER — Other Ambulatory Visit: Payer: Self-pay

## 2020-02-28 DIAGNOSIS — N185 Chronic kidney disease, stage 5: Secondary | ICD-10-CM

## 2020-02-29 DIAGNOSIS — Z992 Dependence on renal dialysis: Secondary | ICD-10-CM | POA: Diagnosis not present

## 2020-02-29 DIAGNOSIS — N2581 Secondary hyperparathyroidism of renal origin: Secondary | ICD-10-CM | POA: Diagnosis not present

## 2020-02-29 DIAGNOSIS — N186 End stage renal disease: Secondary | ICD-10-CM | POA: Diagnosis not present

## 2020-02-29 DIAGNOSIS — E876 Hypokalemia: Secondary | ICD-10-CM | POA: Diagnosis not present

## 2020-02-29 DIAGNOSIS — D631 Anemia in chronic kidney disease: Secondary | ICD-10-CM | POA: Diagnosis not present

## 2020-03-02 DIAGNOSIS — N186 End stage renal disease: Secondary | ICD-10-CM | POA: Diagnosis not present

## 2020-03-02 DIAGNOSIS — D631 Anemia in chronic kidney disease: Secondary | ICD-10-CM | POA: Diagnosis not present

## 2020-03-02 DIAGNOSIS — E876 Hypokalemia: Secondary | ICD-10-CM | POA: Diagnosis not present

## 2020-03-02 DIAGNOSIS — N2581 Secondary hyperparathyroidism of renal origin: Secondary | ICD-10-CM | POA: Diagnosis not present

## 2020-03-02 DIAGNOSIS — Z992 Dependence on renal dialysis: Secondary | ICD-10-CM | POA: Diagnosis not present

## 2020-03-03 ENCOUNTER — Other Ambulatory Visit: Payer: Self-pay | Admitting: Cardiology

## 2020-03-04 DIAGNOSIS — Z992 Dependence on renal dialysis: Secondary | ICD-10-CM | POA: Diagnosis not present

## 2020-03-04 DIAGNOSIS — N2581 Secondary hyperparathyroidism of renal origin: Secondary | ICD-10-CM | POA: Diagnosis not present

## 2020-03-04 DIAGNOSIS — N186 End stage renal disease: Secondary | ICD-10-CM | POA: Diagnosis not present

## 2020-03-04 DIAGNOSIS — D631 Anemia in chronic kidney disease: Secondary | ICD-10-CM | POA: Diagnosis not present

## 2020-03-04 DIAGNOSIS — E876 Hypokalemia: Secondary | ICD-10-CM | POA: Diagnosis not present

## 2020-03-07 DIAGNOSIS — E876 Hypokalemia: Secondary | ICD-10-CM | POA: Diagnosis not present

## 2020-03-07 DIAGNOSIS — N186 End stage renal disease: Secondary | ICD-10-CM | POA: Diagnosis not present

## 2020-03-07 DIAGNOSIS — Z992 Dependence on renal dialysis: Secondary | ICD-10-CM | POA: Diagnosis not present

## 2020-03-07 DIAGNOSIS — D631 Anemia in chronic kidney disease: Secondary | ICD-10-CM | POA: Diagnosis not present

## 2020-03-07 DIAGNOSIS — N2581 Secondary hyperparathyroidism of renal origin: Secondary | ICD-10-CM | POA: Diagnosis not present

## 2020-03-09 DIAGNOSIS — E876 Hypokalemia: Secondary | ICD-10-CM | POA: Diagnosis not present

## 2020-03-09 DIAGNOSIS — N2581 Secondary hyperparathyroidism of renal origin: Secondary | ICD-10-CM | POA: Diagnosis not present

## 2020-03-09 DIAGNOSIS — D631 Anemia in chronic kidney disease: Secondary | ICD-10-CM | POA: Diagnosis not present

## 2020-03-09 DIAGNOSIS — Z992 Dependence on renal dialysis: Secondary | ICD-10-CM | POA: Diagnosis not present

## 2020-03-09 DIAGNOSIS — N186 End stage renal disease: Secondary | ICD-10-CM | POA: Diagnosis not present

## 2020-03-10 DIAGNOSIS — T82858A Stenosis of vascular prosthetic devices, implants and grafts, initial encounter: Secondary | ICD-10-CM | POA: Diagnosis not present

## 2020-03-10 DIAGNOSIS — N186 End stage renal disease: Secondary | ICD-10-CM | POA: Diagnosis not present

## 2020-03-10 DIAGNOSIS — Z992 Dependence on renal dialysis: Secondary | ICD-10-CM | POA: Diagnosis not present

## 2020-03-11 DIAGNOSIS — N186 End stage renal disease: Secondary | ICD-10-CM | POA: Diagnosis not present

## 2020-03-11 DIAGNOSIS — D631 Anemia in chronic kidney disease: Secondary | ICD-10-CM | POA: Diagnosis not present

## 2020-03-11 DIAGNOSIS — E876 Hypokalemia: Secondary | ICD-10-CM | POA: Diagnosis not present

## 2020-03-11 DIAGNOSIS — Z992 Dependence on renal dialysis: Secondary | ICD-10-CM | POA: Diagnosis not present

## 2020-03-11 DIAGNOSIS — N2581 Secondary hyperparathyroidism of renal origin: Secondary | ICD-10-CM | POA: Diagnosis not present

## 2020-03-13 ENCOUNTER — Other Ambulatory Visit: Payer: Self-pay

## 2020-03-13 ENCOUNTER — Ambulatory Visit (INDEPENDENT_AMBULATORY_CARE_PROVIDER_SITE_OTHER): Payer: Medicare Other | Admitting: Physician Assistant

## 2020-03-13 ENCOUNTER — Other Ambulatory Visit (HOSPITAL_COMMUNITY): Admission: RE | Admit: 2020-03-13 | Payer: Medicare Other | Source: Ambulatory Visit

## 2020-03-13 ENCOUNTER — Ambulatory Visit (HOSPITAL_COMMUNITY)
Admission: RE | Admit: 2020-03-13 | Discharge: 2020-03-13 | Disposition: A | Discharge status: Home / Self Care | Payer: Medicare Other | Source: Ambulatory Visit | Attending: Surgery | Admitting: Surgery

## 2020-03-13 VITALS — BP 129/55 | HR 60 | Temp 97.9°F | Resp 20 | Ht 64.0 in

## 2020-03-13 DIAGNOSIS — Z992 Dependence on renal dialysis: Secondary | ICD-10-CM | POA: Diagnosis not present

## 2020-03-13 DIAGNOSIS — N185 Chronic kidney disease, stage 5: Secondary | ICD-10-CM | POA: Diagnosis not present

## 2020-03-13 DIAGNOSIS — N186 End stage renal disease: Secondary | ICD-10-CM

## 2020-03-13 DIAGNOSIS — T82590S Other mechanical complication of surgically created arteriovenous fistula, sequela: Secondary | ICD-10-CM | POA: Diagnosis not present

## 2020-03-13 MED ORDER — SODIUM CHLORIDE 0.9 % IV SOLN: 250.0000 mL | INTRAVENOUS | Status: DC | PRN

## 2020-03-13 NOTE — Progress Notes (Signed)
[facility] [place] HISTORY AND PHYSICAL   History of Present Illness:  Patient is a 79 y.o. year old female who presents for evaluation of her left UE AV graft.  She has a history of venogram with venoplasty of the left brachiocephalic fistula.     She states she has had 3 interventions by [facility] recently to include a possible stent placement.  We did not receive records of these procedures.    She has a CC of left hand coldness while on HD requiring her to wear a glove.  She denise symptoms when she is not on HD.  Motor and sensation are intact.      Past Medical History:  Diagnosis Date  . Anemia   . Anxiety   . Arthritis   . Atrial fibrillation ([place])   . Blood transfusion   . C. difficile diarrhea   . CAD (coronary artery disease)   . Carotid artery occlusion    Carotid Endartectom,y - left 2009.  Blockage Right being watched by Dr Scot Dock.  . Carotid stenosis   . 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 Complication of anesthesia    pt. states that she was difficult to wake  . Depression   . Diabetes mellitus without complication ([place])   . Diverticulosis   . Dysrhythmia   . General weakness 12/2015  . GERD (gastroesophageal reflux disease)   . Glaucoma   . History of hiatal hernia   . History of kidney stones    passed  . History of pneumonia   . HOH (hard of hearing)   . Hypertension   . Hypokalemia 12/2015  . Melanoma ([place])    .  top of head- melonoma  . Myocardial infarction ([place])   . Neuromuscular disorder ([place])    CARPEL TUNNEL  . Pneumonia   . Shortness of breath   . 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 Stroke ([place])    hx of TIA    Past Surgical History:  Procedure Laterality Date  . A/V FISTULAGRAM N/A 07/21/2017   Procedure: A/V FISTULAGRAM - Left Arm;  Surgeon: Angelia Mould, MD;  Location: [place] [facility];  Service: Cardiovascular;  Laterality: N/A;  . AV FISTULA PLACEMENT Left 05/31/2014   Procedure:  Creation of Left Arm arteriovenous brachiocephalic Fistula;  Surgeon: Angelia Mould, MD;  Location: [place];  Service: Vascular;  Laterality: Left;  . AV FISTULA PLACEMENT Left 09/01/2014   Procedure: INSERTION OF ARTERIOVENOUS (AV) GORE-TEX GRAFT LEFT UPPER ARM USING  4-7 MM X 45 CM SRTETCH GORETEX GRAFT;  Surgeon: Angelia Mould, MD;  Location: [place];  Service: Vascular;  Laterality: Left;  . BACK SURGERY    . CARDIAC CATHETERIZATION    . CAROTID ENDARTERECTOMY Left 2009    CEA  . carpel tunnel    . COLONOSCOPY  07/25/2011   Procedure: COLONOSCOPY;  Surgeon: Winfield Cunas., MD;  Location: [facility] ENDOSCOPY;  Service: Endoscopy;  Laterality: N/A;  . CORONARY ARTERY BYPASS GRAFT  07/29/2011   Procedure: CORONARY ARTERY BYPASS GRAFTING (CABG);  Surgeon: Gaye Pollack, MD;  Location: [place];  Service: Open Heart Surgery;  Laterality: N/A;  . CORONARY STENT INTERVENTION N/A 04/08/2018   Procedure: CORONARY STENT INTERVENTION;  Surgeon: Jettie Booze, MD;  Location: [place] [facility];  Service: Cardiovascular;  Laterality: N/A;  . ENDARTERECTOMY Right 04/21/2015   Procedure: ENDARTERECTOMY CAROTID;  Surgeon: Angelia Mould, MD;  Location: [facility] OR;  Service: Vascular;  Laterality: Right;  . ESOPHAGOGASTRODUODENOSCOPY  07/25/2011   Procedure: ESOPHAGOGASTRODUODENOSCOPY (EGD);  Surgeon: Winfield Cunas., MD;  Location: [facility] ENDOSCOPY;  Service: Endoscopy;  Laterality: N/A;  . EYE SURGERY    . FISTULOGRAM Left 08/29/2014   Procedure: FISTULOGRAM;  Surgeon: Angelia Mould, MD;  Location: [facility] CATH LAB;  Service: Cardiovascular;  Laterality: Left;  . KNEE SURGERY Left   . LEFT HEART CATH AND CORS/GRAFTS ANGIOGRAPHY N/A 04/08/2018   Procedure: LEFT HEART CATH AND CORS/GRAFTS ANGIOGRAPHY;  Surgeon: Jettie Booze, MD;  Location: [place] [facility];  Service: Cardiovascular;  Laterality: N/A;  . LUMBAR LAMINECTOMY/DECOMPRESSION MICRODISCECTOMY N/A 12/01/2015   Procedure:  L5-S1 Decompression and Bilateral Microdiscectomy;  Surgeon: Marybelle Killings, MD;  Location: [place];  Service: Orthopedics;  Laterality: N/A;  . NECK SURGERY    . PERIPHERAL VASCULAR BALLOON ANGIOPLASTY  07/21/2017   Procedure: PERIPHERAL VASCULAR BALLOON ANGIOPLASTY;  Surgeon: Angelia Mould, MD;  Location: [place] [facility];  Service: Cardiovascular;;  LT Arm AVF  . TONSILLECTOMY       Social History Social History   Tobacco Use  . Smoking status: Never Smoker  . Smokeless tobacco: Never Used  Substance Use Topics  . Alcohol use: No    Alcohol/week: 0.0 standard drinks  . Drug use: No    Family History Family History  Problem Relation Age of Onset  . Diabetes Mother   . Hypertension Mother   . Heart disease Mother        before age 74  . Heart attack Mother   . Stroke Mother   . Hyperlipidemia Father   . Hypertension Father   . Colon polyps Father   . Lung cancer Father   . Deep vein thrombosis Daughter   . Diabetes Daughter   . Hyperlipidemia Daughter   . Hypertension Daughter   . Heart disease Daughter   . Peripheral vascular disease Daughter   . Breast cancer Neg Hx     Allergies  Allergies  Allergen Reactions  . Neurontin [Gabapentin] Other (See Comments)    Hallucinations and "Makes me go crazy"   . Sulfa Antibiotics Other (See Comments)    Altered mental state   . Norvasc [Amlodipine] Other (See Comments)    "Makes my legs swell"; edema     Current Outpatient Medications  Medication Sig Dispense Refill  . acetaminophen (TYLENOL) 325 MG tablet Take 2 tablets (650 mg total) by mouth every 6 (six) hours as needed for mild pain.    Marland Kitchen allopurinol (ZYLOPRIM) 100 MG tablet Take 100 mg by mouth daily.     Marland Kitchen atorvastatin (LIPITOR) 40 MG tablet Take 1 tablet (40 mg total) by mouth daily at 6 PM. 30 tablet 1  . b complex-C-folic acid 1 MG capsule Take 1 capsule by mouth daily.    . brimonidine (ALPHAGAN P) 0.1 % SOLN Place 1 drop into the left eye 2 (two)  times daily.    . calcium carbonate (TUMS - DOSED IN MG ELEMENTAL CALCIUM) 500 MG chewable tablet Chew 1 tablet (200 mg of elemental calcium total) by mouth every 6 (six) hours as needed for indigestion or heartburn.    . Cholecalciferol (VITAMIN D) 2000 UNITS tablet Take 2,000 Units by mouth 2 (two) times daily.    . clopidogrel (PLAVIX) 75 MG tablet Take 1 tablet (75 mg total) by mouth daily. 90 tablet 3  . colchicine  0.6 MG tablet Take 0.6 mg by mouth 2 (two) times daily as needed (for gout flares).     . Cyanocobalamin (B-12) 5000 MCG CAPS Take 5,000 mcg by mouth daily.    . hydrALAZINE (APRESOLINE) 25 MG tablet TAKE 1 TABLET BY MOUTH TWICE DAILY. ON DAYS OF DIALYSIS TAKE 1 TABLET BY MOUTH IN THE EVENING 180 tablet 1  . hydrocortisone (ANUSOL-HC) 2.5 % rectal cream Place 1 application rectally 2 (two) times daily. Apply to a glycerin suppository and insert rectally. 30 g 1  . hydrocortisone (ANUSOL-HC) 25 MG suppository Place 1 suppository (25 mg total) rectally every 12 (twelve) hours. 12 suppository 1  . Hydrocortisone (GERHARDT'S BUTT CREAM) CREA Apply 5 application topically as needed for irritation. 5 each 1  . Insulin Glargine (LANTUS) 100 UNIT/ML Solostar Pen Inject 15 Units into the skin daily. 15 mL 11  . Insulin Pen Needle (PEN NEEDLES) 31G X 8 MM MISC 15 Units by Does not apply route daily. 100 each 9  . nitroGLYCERIN (NITROSTAT) 0.4 MG SL tablet Place 1 tablet (0.4 mg total) under the tongue every 5 (five) minutes as needed for chest pain. 25 tablet 3  . Omega-3 Fatty Acids (FISH OIL) 1200 MG CAPS Take 1,200 mg by mouth 2 (two) times daily.    Marland Kitchen omeprazole (PRILOSEC) 20 MG capsule Take 20 mg by mouth daily as needed (acid reflux).     . polycarbophil (FIBERCON) 625 MG tablet Take 1 tablet (625 mg total) by mouth daily. 30 tablet 0  . saccharomyces boulardii (FLORASTOR) 250 MG capsule Take 1 capsule (250 mg total) by mouth 2 (two) times daily. 60 capsule 0  . sevelamer carbonate  (RENVELA) 800 MG tablet Take 1 tablet (800 mg total) by mouth 3 (three) times daily with meals. 90 tablet 0  . timolol (TIMOPTIC) 0.5 % ophthalmic solution Place 1 drop into the left eye daily.    . travoprost, benzalkonium, (TRAVATAN) 0.004 % ophthalmic solution Place 1 drop into both eyes at bedtime.      No current facility-administered medications for this visit.    ROS:   General:  No weight loss, Fever, chills  HEENT: No recent headaches, no nasal bleeding, no visual changes, no sore throat  Neurologic: No dizziness, blackouts, seizures. No recent symptoms of stroke or mini- stroke. No recent episodes of slurred speech, or temporary blindness.  Cardiac: No recent episodes of chest pain/pressure, no shortness of breath at rest.  No shortness of breath with exertion.  Denies history of atrial fibrillation or irregular heartbeat  Vascular: No history of rest pain in feet.  No history of claudication.  No history of non-healing ulcer, No history of DVT   Pulmonary: No home oxygen, no productive cough, no hemoptysis,  No asthma or wheezing  Musculoskeletal:  [ ]  Arthritis, [ ]  Low back pain,  [ ]  Joint pain  Hematologic:No history of hypercoagulable state.  No history of easy bleeding.  No history of anemia  Gastrointestinal: No hematochezia or melena,  No gastroesophageal reflux, no trouble swallowing  Urinary: [ ]  chronic Kidney disease, [x ] on HD - [ ]  MWF or [x ] TTHS, [ ]  Burning with urination, [ ]  Frequent urination, [ ]  Difficulty urinating;   Skin: No rashes  Psychological: No history of anxiety,  No history of depression   Physical Examination  Vitals:   03/13/20 0921  BP: (!) 129/55  Pulse: 60  Resp: 20  Temp: 97.9 F (36.6 C)  TempSrc: Temporal  SpO2: 98%  Height: 5\' 4"  (1.626 m)    Body mass index is 27.7 kg/m.  General:  Alert and oriented, no acute distress HEENT: Normal Neck: No bruit or JVD Pulmonary: Clear to auscultation bilaterally Cardiac:  Regular Rate and Rhythm without murmur Gastrointestinal: Soft, non-tender, non-distended, no mass, no scars Skin: No rash Extremity Pulses:  2+ radial, brachial pulses bilaterally Musculoskeletal: No deformity or edema  Neurologic: Upper and lower extremity motor 5/5 and symmetric  DATA:   Findings:  +--------------------+----------+-----------------+-----------------------+   AVF         PSV (cm/s)Flow Vol (mL/min)    Comments        +--------------------+----------+-----------------+-----------------------+   Native artery inflow  347     1347    focal narrowing/  plaque  +--------------------+----------+-----------------+-----------------------+      +------------+----------+-------------+---------+--------------------------  ----+  OUTFLOW VEINPSV (cm/s)Diameter (cm) Depth       Describe                           (cm)                    +------------+----------+-------------+---------+--------------------------  ----+  Prox UA     142    0.80    1.31                    +------------+----------+-------------+---------+--------------------------  ----+  Mid UA     105    0.71    0.32  thickened walls/  calcification  +------------+----------+-------------+---------+--------------------------  ----+  Dist UA     313    0.39    0.89      thickened walls       +------------+----------+-------------+---------+--------------------------  ----+  AC Fossa    442    0.63    1.09                    +------------+----------+-------------+---------+--------------------------  ----+        Summary:  Patent arteriovenous fistula.   ASSESSMENT:  Symptomatic steal AV graft now 79 years old requiring venograms with intervention and stenting by [facility].  The graft duplex  today shows an area of narrowing in the distal graft.  PLAN:  I will schedule her for venogram of the left arm AV graft with possible intervention.  This is her first access.  She may requiring new access pending study results.  HD TTS.  She does take Plavix daily, but no anticoagulation.    Roxy Horseman PA-C [facility] [place] Office: [phone]  MD in clinic [place]

## 2020-03-13 NOTE — H&P (View-Only) (Signed)
[facility] [place] HISTORY AND PHYSICAL   History of Present Illness:  Patient is a 79 y.o. year old female who presents for evaluation of her left UE AV graft.  She has a history of venogram with venoplasty of the left brachiocephalic fistula.     She states she has had 3 interventions by [facility] recently to include a possible stent placement.  We did not receive records of these procedures.    She has a CC of left hand coldness while on HD requiring her to wear a glove.  She denise symptoms when she is not on HD.  Motor and sensation are intact.      Past Medical History:  Diagnosis Date  . Anemia   . Anxiety   . Arthritis   . Atrial fibrillation ([place])   . Blood transfusion   . C. difficile diarrhea   . CAD (coronary artery disease)   . Carotid artery occlusion    Carotid Endartectom,y - left 2009.  Blockage Right being watched by Dr Scot Dock.  . Carotid stenosis   . 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 Complication of anesthesia    pt. states that she was difficult to wake  . Depression   . Diabetes mellitus without complication ([place])   . Diverticulosis   . Dysrhythmia   . General weakness 12/2015  . GERD (gastroesophageal reflux disease)   . Glaucoma   . History of hiatal hernia   . History of kidney stones    passed  . History of pneumonia   . HOH (hard of hearing)   . Hypertension   . Hypokalemia 12/2015  . Melanoma ([place])    .  top of head- melonoma  . Myocardial infarction ([place])   . Neuromuscular disorder ([place])    CARPEL TUNNEL  . Pneumonia   . Shortness of breath   . 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 Stroke ([place])    hx of TIA    Past Surgical History:  Procedure Laterality Date  . A/V FISTULAGRAM N/A 07/21/2017   Procedure: A/V FISTULAGRAM - Left Arm;  Surgeon: Angelia Mould, MD;  Location: [place] [facility];  Service: Cardiovascular;  Laterality: N/A;  . AV FISTULA PLACEMENT Left 05/31/2014   Procedure:  Creation of Left Arm arteriovenous brachiocephalic Fistula;  Surgeon: Angelia Mould, MD;  Location: [place];  Service: Vascular;  Laterality: Left;  . AV FISTULA PLACEMENT Left 09/01/2014   Procedure: INSERTION OF ARTERIOVENOUS (AV) GORE-TEX GRAFT LEFT UPPER ARM USING  4-7 MM X 45 CM SRTETCH GORETEX GRAFT;  Surgeon: Angelia Mould, MD;  Location: [place];  Service: Vascular;  Laterality: Left;  . BACK SURGERY    . CARDIAC CATHETERIZATION    . CAROTID ENDARTERECTOMY Left 2009    CEA  . carpel tunnel    . COLONOSCOPY  07/25/2011   Procedure: COLONOSCOPY;  Surgeon: Winfield Cunas., MD;  Location: [facility] ENDOSCOPY;  Service: Endoscopy;  Laterality: N/A;  . CORONARY ARTERY BYPASS GRAFT  07/29/2011   Procedure: CORONARY ARTERY BYPASS GRAFTING (CABG);  Surgeon: Gaye Pollack, MD;  Location: [place];  Service: Open Heart Surgery;  Laterality: N/A;  . CORONARY STENT INTERVENTION N/A 04/08/2018   Procedure: CORONARY STENT INTERVENTION;  Surgeon: Jettie Booze, MD;  Location: [place] [facility];  Service: Cardiovascular;  Laterality: N/A;  . ENDARTERECTOMY Right 04/21/2015   Procedure: ENDARTERECTOMY CAROTID;  Surgeon: Angelia Mould, MD;  Location: [facility] OR;  Service: Vascular;  Laterality: Right;  . ESOPHAGOGASTRODUODENOSCOPY  07/25/2011   Procedure: ESOPHAGOGASTRODUODENOSCOPY (EGD);  Surgeon: Winfield Cunas., MD;  Location: [facility] ENDOSCOPY;  Service: Endoscopy;  Laterality: N/A;  . EYE SURGERY    . FISTULOGRAM Left 08/29/2014   Procedure: FISTULOGRAM;  Surgeon: Angelia Mould, MD;  Location: [facility] CATH LAB;  Service: Cardiovascular;  Laterality: Left;  . KNEE SURGERY Left   . LEFT HEART CATH AND CORS/GRAFTS ANGIOGRAPHY N/A 04/08/2018   Procedure: LEFT HEART CATH AND CORS/GRAFTS ANGIOGRAPHY;  Surgeon: Jettie Booze, MD;  Location: [place] [facility];  Service: Cardiovascular;  Laterality: N/A;  . LUMBAR LAMINECTOMY/DECOMPRESSION MICRODISCECTOMY N/A 12/01/2015   Procedure:  L5-S1 Decompression and Bilateral Microdiscectomy;  Surgeon: Marybelle Killings, MD;  Location: [place];  Service: Orthopedics;  Laterality: N/A;  . NECK SURGERY    . PERIPHERAL VASCULAR BALLOON ANGIOPLASTY  07/21/2017   Procedure: PERIPHERAL VASCULAR BALLOON ANGIOPLASTY;  Surgeon: Angelia Mould, MD;  Location: [place] [facility];  Service: Cardiovascular;;  LT Arm AVF  . TONSILLECTOMY       Social History Social History   Tobacco Use  . Smoking status: Never Smoker  . Smokeless tobacco: Never Used  Substance Use Topics  . Alcohol use: No    Alcohol/week: 0.0 standard drinks  . Drug use: No    Family History Family History  Problem Relation Age of Onset  . Diabetes Mother   . Hypertension Mother   . Heart disease Mother        before age 77  . Heart attack Mother   . Stroke Mother   . Hyperlipidemia Father   . Hypertension Father   . Colon polyps Father   . Lung cancer Father   . Deep vein thrombosis Daughter   . Diabetes Daughter   . Hyperlipidemia Daughter   . Hypertension Daughter   . Heart disease Daughter   . Peripheral vascular disease Daughter   . Breast cancer Neg Hx     Allergies  Allergies  Allergen Reactions  . Neurontin [Gabapentin] Other (See Comments)    Hallucinations and "Makes me go crazy"   . Sulfa Antibiotics Other (See Comments)    Altered mental state   . Norvasc [Amlodipine] Other (See Comments)    "Makes my legs swell"; edema     Current Outpatient Medications  Medication Sig Dispense Refill  . acetaminophen (TYLENOL) 325 MG tablet Take 2 tablets (650 mg total) by mouth every 6 (six) hours as needed for mild pain.    Marland Kitchen allopurinol (ZYLOPRIM) 100 MG tablet Take 100 mg by mouth daily.     Marland Kitchen atorvastatin (LIPITOR) 40 MG tablet Take 1 tablet (40 mg total) by mouth daily at 6 PM. 30 tablet 1  . b complex-C-folic acid 1 MG capsule Take 1 capsule by mouth daily.    . brimonidine (ALPHAGAN P) 0.1 % SOLN Place 1 drop into the left eye 2 (two)  times daily.    . calcium carbonate (TUMS - DOSED IN MG ELEMENTAL CALCIUM) 500 MG chewable tablet Chew 1 tablet (200 mg of elemental calcium total) by mouth every 6 (six) hours as needed for indigestion or heartburn.    . Cholecalciferol (VITAMIN D) 2000 UNITS tablet Take 2,000 Units by mouth 2 (two) times daily.    . clopidogrel (PLAVIX) 75 MG tablet Take 1 tablet (75 mg total) by mouth daily. 90 tablet 3  . colchicine  0.6 MG tablet Take 0.6 mg by mouth 2 (two) times daily as needed (for gout flares).     . Cyanocobalamin (B-12) 5000 MCG CAPS Take 5,000 mcg by mouth daily.    . hydrALAZINE (APRESOLINE) 25 MG tablet TAKE 1 TABLET BY MOUTH TWICE DAILY. ON DAYS OF DIALYSIS TAKE 1 TABLET BY MOUTH IN THE EVENING 180 tablet 1  . hydrocortisone (ANUSOL-HC) 2.5 % rectal cream Place 1 application rectally 2 (two) times daily. Apply to a glycerin suppository and insert rectally. 30 g 1  . hydrocortisone (ANUSOL-HC) 25 MG suppository Place 1 suppository (25 mg total) rectally every 12 (twelve) hours. 12 suppository 1  . Hydrocortisone (GERHARDT'S BUTT CREAM) CREA Apply 5 application topically as needed for irritation. 5 each 1  . Insulin Glargine (LANTUS) 100 UNIT/ML Solostar Pen Inject 15 Units into the skin daily. 15 mL 11  . Insulin Pen Needle (PEN NEEDLES) 31G X 8 MM MISC 15 Units by Does not apply route daily. 100 each 9  . nitroGLYCERIN (NITROSTAT) 0.4 MG SL tablet Place 1 tablet (0.4 mg total) under the tongue every 5 (five) minutes as needed for chest pain. 25 tablet 3  . Omega-3 Fatty Acids (FISH OIL) 1200 MG CAPS Take 1,200 mg by mouth 2 (two) times daily.    Marland Kitchen omeprazole (PRILOSEC) 20 MG capsule Take 20 mg by mouth daily as needed (acid reflux).     . polycarbophil (FIBERCON) 625 MG tablet Take 1 tablet (625 mg total) by mouth daily. 30 tablet 0  . saccharomyces boulardii (FLORASTOR) 250 MG capsule Take 1 capsule (250 mg total) by mouth 2 (two) times daily. 60 capsule 0  . sevelamer carbonate  (RENVELA) 800 MG tablet Take 1 tablet (800 mg total) by mouth 3 (three) times daily with meals. 90 tablet 0  . timolol (TIMOPTIC) 0.5 % ophthalmic solution Place 1 drop into the left eye daily.    . travoprost, benzalkonium, (TRAVATAN) 0.004 % ophthalmic solution Place 1 drop into both eyes at bedtime.      No current facility-administered medications for this visit.    ROS:   General:  No weight loss, Fever, chills  HEENT: No recent headaches, no nasal bleeding, no visual changes, no sore throat  Neurologic: No dizziness, blackouts, seizures. No recent symptoms of stroke or mini- stroke. No recent episodes of slurred speech, or temporary blindness.  Cardiac: No recent episodes of chest pain/pressure, no shortness of breath at rest.  No shortness of breath with exertion.  Denies history of atrial fibrillation or irregular heartbeat  Vascular: No history of rest pain in feet.  No history of claudication.  No history of non-healing ulcer, No history of DVT   Pulmonary: No home oxygen, no productive cough, no hemoptysis,  No asthma or wheezing  Musculoskeletal:  [ ]  Arthritis, [ ]  Low back pain,  [ ]  Joint pain  Hematologic:No history of hypercoagulable state.  No history of easy bleeding.  No history of anemia  Gastrointestinal: No hematochezia or melena,  No gastroesophageal reflux, no trouble swallowing  Urinary: [ ]  chronic Kidney disease, [x ] on HD - [ ]  MWF or [x ] TTHS, [ ]  Burning with urination, [ ]  Frequent urination, [ ]  Difficulty urinating;   Skin: No rashes  Psychological: No history of anxiety,  No history of depression   Physical Examination  Vitals:   03/13/20 0921  BP: (!) 129/55  Pulse: 60  Resp: 20  Temp: 97.9 F (36.6 C)  TempSrc: Temporal  SpO2: 98%  Height: 5\' 4"  (1.626 m)    Body mass index is 27.7 kg/m.  General:  Alert and oriented, no acute distress HEENT: Normal Neck: No bruit or JVD Pulmonary: Clear to auscultation bilaterally Cardiac:  Regular Rate and Rhythm without murmur Gastrointestinal: Soft, non-tender, non-distended, no mass, no scars Skin: No rash Extremity Pulses:  2+ radial, brachial pulses bilaterally Musculoskeletal: No deformity or edema  Neurologic: Upper and lower extremity motor 5/5 and symmetric  DATA:   Findings:  +--------------------+----------+-----------------+-----------------------+   AVF         PSV (cm/s)Flow Vol (mL/min)    Comments        +--------------------+----------+-----------------+-----------------------+   Native artery inflow  347     1347    focal narrowing/  plaque  +--------------------+----------+-----------------+-----------------------+      +------------+----------+-------------+---------+--------------------------  ----+  OUTFLOW VEINPSV (cm/s)Diameter (cm) Depth       Describe                           (cm)                    +------------+----------+-------------+---------+--------------------------  ----+  Prox UA     142    0.80    1.31                    +------------+----------+-------------+---------+--------------------------  ----+  Mid UA     105    0.71    0.32  thickened walls/  calcification  +------------+----------+-------------+---------+--------------------------  ----+  Dist UA     313    0.39    0.89      thickened walls       +------------+----------+-------------+---------+--------------------------  ----+  AC Fossa    442    0.63    1.09                    +------------+----------+-------------+---------+--------------------------  ----+        Summary:  Patent arteriovenous fistula.   ASSESSMENT:  Symptomatic steal AV graft now 79 years old requiring venograms with intervention and stenting by [facility].  The graft duplex  today shows an area of narrowing in the distal graft.  PLAN:  I will schedule her for venogram of the left arm AV graft with possible intervention.  This is her first access.  She may requiring new access pending study results.  HD TTS.  She does take Plavix daily, but no anticoagulation.    Roxy Horseman PA-C [facility] [place] Office: [phone]  MD in clinic [place]

## 2020-03-14 ENCOUNTER — Other Ambulatory Visit (HOSPITAL_COMMUNITY)
Admission: RE | Admit: 2020-03-14 | Discharge: 2020-03-14 | Disposition: A | Discharge status: Home / Self Care | Payer: Medicare Other | Source: Ambulatory Visit | Attending: Vascular Surgery | Admitting: Vascular Surgery

## 2020-03-14 DIAGNOSIS — Z992 Dependence on renal dialysis: Secondary | ICD-10-CM | POA: Diagnosis not present

## 2020-03-14 DIAGNOSIS — Z20822 Contact with and (suspected) exposure to covid-19: Secondary | ICD-10-CM | POA: Diagnosis not present

## 2020-03-14 DIAGNOSIS — N2581 Secondary hyperparathyroidism of renal origin: Secondary | ICD-10-CM | POA: Diagnosis not present

## 2020-03-14 DIAGNOSIS — Z01812 Encounter for preprocedural laboratory examination: Secondary | ICD-10-CM | POA: Insufficient documentation

## 2020-03-14 DIAGNOSIS — D631 Anemia in chronic kidney disease: Secondary | ICD-10-CM | POA: Diagnosis not present

## 2020-03-14 DIAGNOSIS — N186 End stage renal disease: Secondary | ICD-10-CM | POA: Diagnosis not present

## 2020-03-14 DIAGNOSIS — E876 Hypokalemia: Secondary | ICD-10-CM | POA: Diagnosis not present

## 2020-03-14 LAB — SARS CORONAVIRUS 2 (TAT 6-24 HRS): SARS Coronavirus 2: NEGATIVE

## 2020-03-15 DIAGNOSIS — Z992 Dependence on renal dialysis: Secondary | ICD-10-CM | POA: Diagnosis not present

## 2020-03-15 DIAGNOSIS — E1129 Type 2 diabetes mellitus with other diabetic kidney complication: Secondary | ICD-10-CM | POA: Diagnosis not present

## 2020-03-15 DIAGNOSIS — N186 End stage renal disease: Secondary | ICD-10-CM | POA: Diagnosis not present

## 2020-03-16 ENCOUNTER — Telehealth: Payer: Self-pay | Admitting: *Deleted

## 2020-03-16 DIAGNOSIS — Z992 Dependence on renal dialysis: Secondary | ICD-10-CM | POA: Diagnosis not present

## 2020-03-16 DIAGNOSIS — D631 Anemia in chronic kidney disease: Secondary | ICD-10-CM | POA: Diagnosis not present

## 2020-03-16 DIAGNOSIS — E876 Hypokalemia: Secondary | ICD-10-CM | POA: Diagnosis not present

## 2020-03-16 DIAGNOSIS — N2581 Secondary hyperparathyroidism of renal origin: Secondary | ICD-10-CM | POA: Diagnosis not present

## 2020-03-16 DIAGNOSIS — N186 End stage renal disease: Secondary | ICD-10-CM | POA: Diagnosis not present

## 2020-03-16 NOTE — Telephone Encounter (Signed)
Attempted to reach pt to notify of adjusted procedure time. She will need to be at the hospital at 0700 tomorrow morning. Left generic VM for pt to call back.

## 2020-03-17 ENCOUNTER — Encounter (HOSPITAL_COMMUNITY)
Admission: RE | Disposition: A | Discharge status: Home / Self Care | Payer: Self-pay | Source: Home / Self Care | Attending: Vascular Surgery

## 2020-03-17 ENCOUNTER — Ambulatory Visit (HOSPITAL_COMMUNITY)
Admission: RE | Admit: 2020-03-17 | Discharge: 2020-03-17 | Disposition: A | Discharge status: Home / Self Care | Payer: Medicare Other | Attending: Vascular Surgery | Admitting: Vascular Surgery

## 2020-03-17 ENCOUNTER — Other Ambulatory Visit: Payer: Self-pay

## 2020-03-17 DIAGNOSIS — Z7902 Long term (current) use of antithrombotics/antiplatelets: Secondary | ICD-10-CM | POA: Diagnosis not present

## 2020-03-17 DIAGNOSIS — I2510 Atherosclerotic heart disease of native coronary artery without angina pectoris: Secondary | ICD-10-CM | POA: Insufficient documentation

## 2020-03-17 DIAGNOSIS — Z85820 Personal history of malignant melanoma of skin: Secondary | ICD-10-CM | POA: Diagnosis not present

## 2020-03-17 DIAGNOSIS — Y841 Kidney dialysis as the cause of abnormal reaction of the patient, or of later complication, without mention of misadventure at the time of the procedure: Secondary | ICD-10-CM | POA: Insufficient documentation

## 2020-03-17 DIAGNOSIS — E1122 Type 2 diabetes mellitus with diabetic chronic kidney disease: Secondary | ICD-10-CM | POA: Insufficient documentation

## 2020-03-17 DIAGNOSIS — Z888 Allergy status to other drugs, medicaments and biological substances status: Secondary | ICD-10-CM | POA: Diagnosis not present

## 2020-03-17 DIAGNOSIS — N184 Chronic kidney disease, stage 4 (severe): Secondary | ICD-10-CM | POA: Diagnosis not present

## 2020-03-17 DIAGNOSIS — I4891 Unspecified atrial fibrillation: Secondary | ICD-10-CM | POA: Diagnosis not present

## 2020-03-17 DIAGNOSIS — Z8673 Personal history of transient ischemic attack (TIA), and cerebral infarction without residual deficits: Secondary | ICD-10-CM | POA: Diagnosis not present

## 2020-03-17 DIAGNOSIS — I6529 Occlusion and stenosis of unspecified carotid artery: Secondary | ICD-10-CM | POA: Insufficient documentation

## 2020-03-17 DIAGNOSIS — Z794 Long term (current) use of insulin: Secondary | ICD-10-CM | POA: Diagnosis not present

## 2020-03-17 DIAGNOSIS — I129 Hypertensive chronic kidney disease with stage 1 through stage 4 chronic kidney disease, or unspecified chronic kidney disease: Secondary | ICD-10-CM | POA: Diagnosis not present

## 2020-03-17 DIAGNOSIS — F419 Anxiety disorder, unspecified: Secondary | ICD-10-CM | POA: Insufficient documentation

## 2020-03-17 DIAGNOSIS — K219 Gastro-esophageal reflux disease without esophagitis: Secondary | ICD-10-CM | POA: Diagnosis not present

## 2020-03-17 DIAGNOSIS — F329 Major depressive disorder, single episode, unspecified: Secondary | ICD-10-CM | POA: Insufficient documentation

## 2020-03-17 DIAGNOSIS — Z882 Allergy status to sulfonamides status: Secondary | ICD-10-CM | POA: Insufficient documentation

## 2020-03-17 DIAGNOSIS — T82510A Breakdown (mechanical) of surgically created arteriovenous fistula, initial encounter: Secondary | ICD-10-CM | POA: Diagnosis not present

## 2020-03-17 DIAGNOSIS — N186 End stage renal disease: Secondary | ICD-10-CM | POA: Diagnosis not present

## 2020-03-17 DIAGNOSIS — Z79899 Other long term (current) drug therapy: Secondary | ICD-10-CM | POA: Diagnosis not present

## 2020-03-17 DIAGNOSIS — I252 Old myocardial infarction: Secondary | ICD-10-CM | POA: Diagnosis not present

## 2020-03-17 DIAGNOSIS — D649 Anemia, unspecified: Secondary | ICD-10-CM | POA: Diagnosis not present

## 2020-03-17 DIAGNOSIS — K5790 Diverticulosis of intestine, part unspecified, without perforation or abscess without bleeding: Secondary | ICD-10-CM | POA: Diagnosis not present

## 2020-03-17 DIAGNOSIS — Z992 Dependence on renal dialysis: Secondary | ICD-10-CM | POA: Insufficient documentation

## 2020-03-17 DIAGNOSIS — T82898A Other specified complication of vascular prosthetic devices, implants and grafts, initial encounter: Secondary | ICD-10-CM | POA: Diagnosis not present

## 2020-03-17 HISTORY — PX: A/V FISTULAGRAM: CATH118298

## 2020-03-17 LAB — GLUCOSE, CAPILLARY: Glucose-Capillary: 112 mg/dL — ABNORMAL HIGH (ref 70–99)

## 2020-03-17 LAB — POCT I-STAT, CHEM 8
BUN: 32 mg/dL — ABNORMAL HIGH (ref 8–23)
Calcium, Ion: 0.91 mmol/L — ABNORMAL LOW (ref 1.15–1.40)
Chloride: 98 mmol/L (ref 98–111)
Creatinine, Ser: 5.1 mg/dL — ABNORMAL HIGH (ref 0.44–1.00)
Glucose, Bld: 113 mg/dL — ABNORMAL HIGH (ref 70–99)
HCT: 34 % — ABNORMAL LOW (ref 36.0–46.0)
Hemoglobin: 11.6 g/dL — ABNORMAL LOW (ref 12.0–15.0)
Potassium: 5.4 mmol/L — ABNORMAL HIGH (ref 3.5–5.1)
Sodium: 135 mmol/L (ref 135–145)
TCO2: 31 mmol/L (ref 22–32)

## 2020-03-17 SURGERY — A/V FISTULAGRAM
Anesthesia: LOCAL

## 2020-03-17 MED ORDER — IODIXANOL 320 MG/ML IV SOLN
INTRAVENOUS | Status: DC | PRN
  Administered 2020-03-17: 35 mL via INTRAVENOUS

## 2020-03-17 MED ORDER — SODIUM CHLORIDE 0.9% FLUSH: 3.0000 mL | INTRAVENOUS | Status: DC | PRN

## 2020-03-17 MED ORDER — LIDOCAINE HCL (PF) 1 % IJ SOLN
INTRAMUSCULAR | Status: AC
  Filled 2020-03-17: qty 30

## 2020-03-17 MED ORDER — HEPARIN (PORCINE) IN NACL 1000-0.9 UT/500ML-% IV SOLN
INTRAVENOUS | Status: DC | PRN
  Administered 2020-03-17: 500 mL

## 2020-03-17 MED ORDER — HEPARIN (PORCINE) IN NACL 1000-0.9 UT/500ML-% IV SOLN
INTRAVENOUS | Status: AC
  Filled 2020-03-17: qty 500

## 2020-03-17 MED ORDER — LIDOCAINE HCL (PF) 1 % IJ SOLN
INTRAMUSCULAR | Status: DC | PRN
  Administered 2020-03-17: 2 mL via INTRADERMAL

## 2020-03-17 MED ORDER — SODIUM CHLORIDE 0.9% FLUSH: 3.0000 mL | Freq: Two times a day (BID) | INTRAVENOUS | Status: DC

## 2020-03-17 SURGICAL SUPPLY — 5 items
COVER DOME SNAP 22 D (MISCELLANEOUS) ×3 IMPLANT
KIT MICROPUNCTURE NIT STIFF (SHEATH) ×3 IMPLANT
SHEATH PROBE COVER 6X72 (BAG) ×3 IMPLANT
TRAY PV CATH (CUSTOM PROCEDURE TRAY) ×3 IMPLANT
TUBING CIL FLEX 10 FLL-RA (TUBING) ×3 IMPLANT

## 2020-03-17 NOTE — Op Note (Signed)
° °  PATIENT: [name]      MRN: [id number] DOB: [birth year]    DATE OF PROCEDURE: 03/17/2020  INDICATIONS:    [name] is a 79 y.o. 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  She tells me that she has had the symptoms for a year.  She has to wear a glove when she is on dialysis.  She states that her fistula has been working well although sometimes is difficult to cannulate  PROCEDURE:    1.  Ultrasound-guided access to the left upper arm graft 2.  Fistulogram left upper arm graft  SURGEON: Judeth Cornfield. Scot Dock, MD, FACS  ANESTHESIA: Local  EBL: Minimal  TECHNIQUE: Patient was taken to the peripheral vascular lab.  The left arm was prepped and draped in usual sterile fashion.  Under ultrasound guidance, after the skin was anesthetized, I cannulated the proximal graft with a micropuncture needle and a micropuncture sheath introduced over the wire.  The graft was thickened.  A real-time image was sent to the server.  Fistulogram was then obtained to evaluate the central veins and including the entire graft to the level of cannulation.  Next the blood pressure cuff was inflated and a retrograde shot was done to evaluate the proximal anastomosis.  At the completion of the procedure a 4-0 Monocryl suture was placed around the cannulation site and tied for hemostasis.  No immediate complications were noted.  FINDINGS:   1.  There is no significant central venous stenosis on the left.  There is only a very mild subclavian stenosis.  This did not appear to be significant as the fistula had an excellent thrill and was not pulsatile. 2.  Widely patent left upper arm graft.  No problems at the arterial anastomosis.  No evidence of occlusive disease in the brachial artery adjacent to the anastomosis.  CLINICAL NOTE: She states that her steal symptoms are stable.  I explained that certainly we could bring her into the office for a vein map on the right and arterial  Doppler study to evaluate her for new access in the right arm.  We could then place access in the right arm and allow them to continue to use the access in the left arm until the right arm was ready to be used.  Once the right arm was being used we could ligate the left arm graft.  However currently she feels like her symptoms are tolerable and therefore she is elected to call us if her symptoms progress.  Debra Mayo, MD, FACS [facility]  DATE OF DICTATION:   03/17/2020

## 2020-03-17 NOTE — Interval H&P Note (Signed)
History and Physical Interval Note:  03/17/2020 9:05 AM  [name]  has presented today for surgery, with the diagnosis of malfunction of av graft.  The various methods of treatment have been discussed with the patient and family. After consideration of risks, benefits and other options for treatment, the patient has consented to  Procedure(s): UPPER EXTREMITY VENOGRAPHY (N/A) as a surgical intervention.  The patient's history has been reviewed, patient examined, no change in status, stable for surgery.  I have reviewed the patient's chart and labs.  Questions were answered to the patient's satisfaction.     [name]

## 2020-03-17 NOTE — Discharge Instructions (Signed)

## 2020-03-17 NOTE — Interval H&P Note (Signed)
History and Physical Interval Note:  03/17/2020 9:06 AM  [name]  has presented today for surgery, with the diagnosis of malfunction of av graft.  The various methods of treatment have been discussed with the patient and family. After consideration of risks, benefits and other options for treatment, the patient has consented to  Procedure(s): UPPER EXTREMITY VENOGRAPHY (N/A) as a surgical intervention.  The patient's history has been reviewed, patient examined, no change in status, stable for surgery.  I have reviewed the patient's chart and labs.  Questions were answered to the patient's satisfaction.     [name]

## 2020-03-18 DIAGNOSIS — N186 End stage renal disease: Secondary | ICD-10-CM | POA: Diagnosis not present

## 2020-03-18 DIAGNOSIS — D631 Anemia in chronic kidney disease: Secondary | ICD-10-CM | POA: Diagnosis not present

## 2020-03-18 DIAGNOSIS — Z992 Dependence on renal dialysis: Secondary | ICD-10-CM | POA: Diagnosis not present

## 2020-03-18 DIAGNOSIS — N2581 Secondary hyperparathyroidism of renal origin: Secondary | ICD-10-CM | POA: Diagnosis not present

## 2020-03-18 DIAGNOSIS — E876 Hypokalemia: Secondary | ICD-10-CM | POA: Diagnosis not present

## 2020-03-21 ENCOUNTER — Encounter (HOSPITAL_COMMUNITY): Payer: Self-pay | Admitting: Vascular Surgery

## 2020-03-21 DIAGNOSIS — D631 Anemia in chronic kidney disease: Secondary | ICD-10-CM | POA: Diagnosis not present

## 2020-03-21 DIAGNOSIS — N2581 Secondary hyperparathyroidism of renal origin: Secondary | ICD-10-CM | POA: Diagnosis not present

## 2020-03-21 DIAGNOSIS — N186 End stage renal disease: Secondary | ICD-10-CM | POA: Diagnosis not present

## 2020-03-21 DIAGNOSIS — E876 Hypokalemia: Secondary | ICD-10-CM | POA: Diagnosis not present

## 2020-03-21 DIAGNOSIS — Z992 Dependence on renal dialysis: Secondary | ICD-10-CM | POA: Diagnosis not present

## 2020-03-23 DIAGNOSIS — E876 Hypokalemia: Secondary | ICD-10-CM | POA: Diagnosis not present

## 2020-03-23 DIAGNOSIS — N186 End stage renal disease: Secondary | ICD-10-CM | POA: Diagnosis not present

## 2020-03-23 DIAGNOSIS — Z992 Dependence on renal dialysis: Secondary | ICD-10-CM | POA: Diagnosis not present

## 2020-03-23 DIAGNOSIS — D631 Anemia in chronic kidney disease: Secondary | ICD-10-CM | POA: Diagnosis not present

## 2020-03-23 DIAGNOSIS — N2581 Secondary hyperparathyroidism of renal origin: Secondary | ICD-10-CM | POA: Diagnosis not present

## 2020-03-25 DIAGNOSIS — D631 Anemia in chronic kidney disease: Secondary | ICD-10-CM | POA: Diagnosis not present

## 2020-03-25 DIAGNOSIS — Z992 Dependence on renal dialysis: Secondary | ICD-10-CM | POA: Diagnosis not present

## 2020-03-25 DIAGNOSIS — N2581 Secondary hyperparathyroidism of renal origin: Secondary | ICD-10-CM | POA: Diagnosis not present

## 2020-03-25 DIAGNOSIS — E876 Hypokalemia: Secondary | ICD-10-CM | POA: Diagnosis not present

## 2020-03-25 DIAGNOSIS — N186 End stage renal disease: Secondary | ICD-10-CM | POA: Diagnosis not present

## 2020-03-28 DIAGNOSIS — N186 End stage renal disease: Secondary | ICD-10-CM | POA: Diagnosis not present

## 2020-03-28 DIAGNOSIS — D631 Anemia in chronic kidney disease: Secondary | ICD-10-CM | POA: Diagnosis not present

## 2020-03-28 DIAGNOSIS — E876 Hypokalemia: Secondary | ICD-10-CM | POA: Diagnosis not present

## 2020-03-28 DIAGNOSIS — Z992 Dependence on renal dialysis: Secondary | ICD-10-CM | POA: Diagnosis not present

## 2020-03-28 DIAGNOSIS — N2581 Secondary hyperparathyroidism of renal origin: Secondary | ICD-10-CM | POA: Diagnosis not present

## 2020-03-30 DIAGNOSIS — E876 Hypokalemia: Secondary | ICD-10-CM | POA: Diagnosis not present

## 2020-03-30 DIAGNOSIS — Z992 Dependence on renal dialysis: Secondary | ICD-10-CM | POA: Diagnosis not present

## 2020-03-30 DIAGNOSIS — D631 Anemia in chronic kidney disease: Secondary | ICD-10-CM | POA: Diagnosis not present

## 2020-03-30 DIAGNOSIS — N186 End stage renal disease: Secondary | ICD-10-CM | POA: Diagnosis not present

## 2020-03-30 DIAGNOSIS — N2581 Secondary hyperparathyroidism of renal origin: Secondary | ICD-10-CM | POA: Diagnosis not present

## 2020-04-01 DIAGNOSIS — Z992 Dependence on renal dialysis: Secondary | ICD-10-CM | POA: Diagnosis not present

## 2020-04-01 DIAGNOSIS — E876 Hypokalemia: Secondary | ICD-10-CM | POA: Diagnosis not present

## 2020-04-01 DIAGNOSIS — N186 End stage renal disease: Secondary | ICD-10-CM | POA: Diagnosis not present

## 2020-04-01 DIAGNOSIS — D631 Anemia in chronic kidney disease: Secondary | ICD-10-CM | POA: Diagnosis not present

## 2020-04-01 DIAGNOSIS — N2581 Secondary hyperparathyroidism of renal origin: Secondary | ICD-10-CM | POA: Diagnosis not present

## 2020-04-04 DIAGNOSIS — N2581 Secondary hyperparathyroidism of renal origin: Secondary | ICD-10-CM | POA: Diagnosis not present

## 2020-04-04 DIAGNOSIS — N186 End stage renal disease: Secondary | ICD-10-CM | POA: Diagnosis not present

## 2020-04-04 DIAGNOSIS — D631 Anemia in chronic kidney disease: Secondary | ICD-10-CM | POA: Diagnosis not present

## 2020-04-04 DIAGNOSIS — E876 Hypokalemia: Secondary | ICD-10-CM | POA: Diagnosis not present

## 2020-04-04 DIAGNOSIS — Z992 Dependence on renal dialysis: Secondary | ICD-10-CM | POA: Diagnosis not present

## 2020-04-06 DIAGNOSIS — N2581 Secondary hyperparathyroidism of renal origin: Secondary | ICD-10-CM | POA: Diagnosis not present

## 2020-04-06 DIAGNOSIS — Z992 Dependence on renal dialysis: Secondary | ICD-10-CM | POA: Diagnosis not present

## 2020-04-06 DIAGNOSIS — N186 End stage renal disease: Secondary | ICD-10-CM | POA: Diagnosis not present

## 2020-04-06 DIAGNOSIS — E876 Hypokalemia: Secondary | ICD-10-CM | POA: Diagnosis not present

## 2020-04-06 DIAGNOSIS — D631 Anemia in chronic kidney disease: Secondary | ICD-10-CM | POA: Diagnosis not present

## 2020-04-08 DIAGNOSIS — E876 Hypokalemia: Secondary | ICD-10-CM | POA: Diagnosis not present

## 2020-04-08 DIAGNOSIS — N186 End stage renal disease: Secondary | ICD-10-CM | POA: Diagnosis not present

## 2020-04-08 DIAGNOSIS — Z992 Dependence on renal dialysis: Secondary | ICD-10-CM | POA: Diagnosis not present

## 2020-04-08 DIAGNOSIS — N2581 Secondary hyperparathyroidism of renal origin: Secondary | ICD-10-CM | POA: Diagnosis not present

## 2020-04-08 DIAGNOSIS — D631 Anemia in chronic kidney disease: Secondary | ICD-10-CM | POA: Diagnosis not present

## 2020-04-11 DIAGNOSIS — D631 Anemia in chronic kidney disease: Secondary | ICD-10-CM | POA: Diagnosis not present

## 2020-04-11 DIAGNOSIS — N2581 Secondary hyperparathyroidism of renal origin: Secondary | ICD-10-CM | POA: Diagnosis not present

## 2020-04-11 DIAGNOSIS — N186 End stage renal disease: Secondary | ICD-10-CM | POA: Diagnosis not present

## 2020-04-11 DIAGNOSIS — E876 Hypokalemia: Secondary | ICD-10-CM | POA: Diagnosis not present

## 2020-04-11 DIAGNOSIS — Z992 Dependence on renal dialysis: Secondary | ICD-10-CM | POA: Diagnosis not present

## 2020-04-13 DIAGNOSIS — E11319 Type 2 diabetes mellitus with unspecified diabetic retinopathy without macular edema: Secondary | ICD-10-CM | POA: Diagnosis not present

## 2020-04-13 DIAGNOSIS — E876 Hypokalemia: Secondary | ICD-10-CM | POA: Diagnosis not present

## 2020-04-13 DIAGNOSIS — E1165 Type 2 diabetes mellitus with hyperglycemia: Secondary | ICD-10-CM | POA: Diagnosis not present

## 2020-04-13 DIAGNOSIS — Z992 Dependence on renal dialysis: Secondary | ICD-10-CM | POA: Diagnosis not present

## 2020-04-13 DIAGNOSIS — I10 Essential (primary) hypertension: Secondary | ICD-10-CM | POA: Diagnosis not present

## 2020-04-13 DIAGNOSIS — M179 Osteoarthritis of knee, unspecified: Secondary | ICD-10-CM | POA: Diagnosis not present

## 2020-04-13 DIAGNOSIS — N184 Chronic kidney disease, stage 4 (severe): Secondary | ICD-10-CM | POA: Diagnosis not present

## 2020-04-13 DIAGNOSIS — D631 Anemia in chronic kidney disease: Secondary | ICD-10-CM | POA: Diagnosis not present

## 2020-04-13 DIAGNOSIS — I25111 Atherosclerotic heart disease of native coronary artery with angina pectoris with documented spasm: Secondary | ICD-10-CM | POA: Diagnosis not present

## 2020-04-13 DIAGNOSIS — E1121 Type 2 diabetes mellitus with diabetic nephropathy: Secondary | ICD-10-CM | POA: Diagnosis not present

## 2020-04-13 DIAGNOSIS — N186 End stage renal disease: Secondary | ICD-10-CM | POA: Diagnosis not present

## 2020-04-13 DIAGNOSIS — J45991 Cough variant asthma: Secondary | ICD-10-CM | POA: Diagnosis not present

## 2020-04-13 DIAGNOSIS — N2581 Secondary hyperparathyroidism of renal origin: Secondary | ICD-10-CM | POA: Diagnosis not present

## 2020-04-13 DIAGNOSIS — E1140 Type 2 diabetes mellitus with diabetic neuropathy, unspecified: Secondary | ICD-10-CM | POA: Diagnosis not present

## 2020-04-13 DIAGNOSIS — E781 Pure hyperglyceridemia: Secondary | ICD-10-CM | POA: Diagnosis not present

## 2020-04-13 DIAGNOSIS — M158 Other polyosteoarthritis: Secondary | ICD-10-CM | POA: Diagnosis not present

## 2020-04-13 DIAGNOSIS — E782 Mixed hyperlipidemia: Secondary | ICD-10-CM | POA: Diagnosis not present

## 2020-04-14 DIAGNOSIS — Z992 Dependence on renal dialysis: Secondary | ICD-10-CM | POA: Diagnosis not present

## 2020-04-14 DIAGNOSIS — E1129 Type 2 diabetes mellitus with other diabetic kidney complication: Secondary | ICD-10-CM | POA: Diagnosis not present

## 2020-04-14 DIAGNOSIS — N186 End stage renal disease: Secondary | ICD-10-CM | POA: Diagnosis not present

## 2020-04-15 DIAGNOSIS — Z992 Dependence on renal dialysis: Secondary | ICD-10-CM | POA: Diagnosis not present

## 2020-04-15 DIAGNOSIS — Z23 Encounter for immunization: Secondary | ICD-10-CM | POA: Diagnosis not present

## 2020-04-15 DIAGNOSIS — N2581 Secondary hyperparathyroidism of renal origin: Secondary | ICD-10-CM | POA: Diagnosis not present

## 2020-04-15 DIAGNOSIS — E876 Hypokalemia: Secondary | ICD-10-CM | POA: Diagnosis not present

## 2020-04-15 DIAGNOSIS — D631 Anemia in chronic kidney disease: Secondary | ICD-10-CM | POA: Diagnosis not present

## 2020-04-15 DIAGNOSIS — N186 End stage renal disease: Secondary | ICD-10-CM | POA: Diagnosis not present

## 2020-04-18 DIAGNOSIS — D631 Anemia in chronic kidney disease: Secondary | ICD-10-CM | POA: Diagnosis not present

## 2020-04-18 DIAGNOSIS — Z23 Encounter for immunization: Secondary | ICD-10-CM | POA: Diagnosis not present

## 2020-04-18 DIAGNOSIS — E876 Hypokalemia: Secondary | ICD-10-CM | POA: Diagnosis not present

## 2020-04-18 DIAGNOSIS — Z992 Dependence on renal dialysis: Secondary | ICD-10-CM | POA: Diagnosis not present

## 2020-04-18 DIAGNOSIS — N186 End stage renal disease: Secondary | ICD-10-CM | POA: Diagnosis not present

## 2020-04-18 DIAGNOSIS — N2581 Secondary hyperparathyroidism of renal origin: Secondary | ICD-10-CM | POA: Diagnosis not present

## 2020-04-20 DIAGNOSIS — N186 End stage renal disease: Secondary | ICD-10-CM | POA: Diagnosis not present

## 2020-04-20 DIAGNOSIS — N2581 Secondary hyperparathyroidism of renal origin: Secondary | ICD-10-CM | POA: Diagnosis not present

## 2020-04-20 DIAGNOSIS — Z23 Encounter for immunization: Secondary | ICD-10-CM | POA: Diagnosis not present

## 2020-04-20 DIAGNOSIS — D631 Anemia in chronic kidney disease: Secondary | ICD-10-CM | POA: Diagnosis not present

## 2020-04-20 DIAGNOSIS — Z992 Dependence on renal dialysis: Secondary | ICD-10-CM | POA: Diagnosis not present

## 2020-04-20 DIAGNOSIS — E876 Hypokalemia: Secondary | ICD-10-CM | POA: Diagnosis not present

## 2020-04-21 DIAGNOSIS — E119 Type 2 diabetes mellitus without complications: Secondary | ICD-10-CM | POA: Diagnosis not present

## 2020-04-21 DIAGNOSIS — I10 Essential (primary) hypertension: Secondary | ICD-10-CM | POA: Diagnosis not present

## 2020-04-21 DIAGNOSIS — N184 Chronic kidney disease, stage 4 (severe): Secondary | ICD-10-CM | POA: Diagnosis not present

## 2020-04-21 DIAGNOSIS — E7800 Pure hypercholesterolemia, unspecified: Secondary | ICD-10-CM | POA: Diagnosis not present

## 2020-04-21 DIAGNOSIS — E781 Pure hyperglyceridemia: Secondary | ICD-10-CM | POA: Diagnosis not present

## 2020-04-21 DIAGNOSIS — J45991 Cough variant asthma: Secondary | ICD-10-CM | POA: Diagnosis not present

## 2020-04-21 DIAGNOSIS — E782 Mixed hyperlipidemia: Secondary | ICD-10-CM | POA: Diagnosis not present

## 2020-04-22 DIAGNOSIS — Z992 Dependence on renal dialysis: Secondary | ICD-10-CM | POA: Diagnosis not present

## 2020-04-22 DIAGNOSIS — D631 Anemia in chronic kidney disease: Secondary | ICD-10-CM | POA: Diagnosis not present

## 2020-04-22 DIAGNOSIS — E876 Hypokalemia: Secondary | ICD-10-CM | POA: Diagnosis not present

## 2020-04-22 DIAGNOSIS — N2581 Secondary hyperparathyroidism of renal origin: Secondary | ICD-10-CM | POA: Diagnosis not present

## 2020-04-22 DIAGNOSIS — Z23 Encounter for immunization: Secondary | ICD-10-CM | POA: Diagnosis not present

## 2020-04-22 DIAGNOSIS — N186 End stage renal disease: Secondary | ICD-10-CM | POA: Diagnosis not present

## 2020-04-25 DIAGNOSIS — Z992 Dependence on renal dialysis: Secondary | ICD-10-CM | POA: Diagnosis not present

## 2020-04-25 DIAGNOSIS — N2581 Secondary hyperparathyroidism of renal origin: Secondary | ICD-10-CM | POA: Diagnosis not present

## 2020-04-25 DIAGNOSIS — N186 End stage renal disease: Secondary | ICD-10-CM | POA: Diagnosis not present

## 2020-04-25 DIAGNOSIS — Z23 Encounter for immunization: Secondary | ICD-10-CM | POA: Diagnosis not present

## 2020-04-25 DIAGNOSIS — E876 Hypokalemia: Secondary | ICD-10-CM | POA: Diagnosis not present

## 2020-04-25 DIAGNOSIS — D631 Anemia in chronic kidney disease: Secondary | ICD-10-CM | POA: Diagnosis not present

## 2020-04-27 DIAGNOSIS — N186 End stage renal disease: Secondary | ICD-10-CM | POA: Diagnosis not present

## 2020-04-27 DIAGNOSIS — Z992 Dependence on renal dialysis: Secondary | ICD-10-CM | POA: Diagnosis not present

## 2020-04-27 DIAGNOSIS — E876 Hypokalemia: Secondary | ICD-10-CM | POA: Diagnosis not present

## 2020-04-27 DIAGNOSIS — N2581 Secondary hyperparathyroidism of renal origin: Secondary | ICD-10-CM | POA: Diagnosis not present

## 2020-04-27 DIAGNOSIS — D631 Anemia in chronic kidney disease: Secondary | ICD-10-CM | POA: Diagnosis not present

## 2020-04-27 DIAGNOSIS — E0869 Diabetes mellitus due to underlying condition with other specified complication: Secondary | ICD-10-CM | POA: Diagnosis not present

## 2020-04-27 DIAGNOSIS — Z23 Encounter for immunization: Secondary | ICD-10-CM | POA: Diagnosis not present

## 2020-04-29 DIAGNOSIS — E876 Hypokalemia: Secondary | ICD-10-CM | POA: Diagnosis not present

## 2020-04-29 DIAGNOSIS — N2581 Secondary hyperparathyroidism of renal origin: Secondary | ICD-10-CM | POA: Diagnosis not present

## 2020-04-29 DIAGNOSIS — N186 End stage renal disease: Secondary | ICD-10-CM | POA: Diagnosis not present

## 2020-04-29 DIAGNOSIS — Z23 Encounter for immunization: Secondary | ICD-10-CM | POA: Diagnosis not present

## 2020-04-29 DIAGNOSIS — Z992 Dependence on renal dialysis: Secondary | ICD-10-CM | POA: Diagnosis not present

## 2020-04-29 DIAGNOSIS — D631 Anemia in chronic kidney disease: Secondary | ICD-10-CM | POA: Diagnosis not present

## 2020-05-02 ENCOUNTER — Other Ambulatory Visit: Payer: Self-pay | Admitting: *Deleted

## 2020-05-02 DIAGNOSIS — N186 End stage renal disease: Secondary | ICD-10-CM | POA: Diagnosis not present

## 2020-05-02 DIAGNOSIS — N2581 Secondary hyperparathyroidism of renal origin: Secondary | ICD-10-CM | POA: Diagnosis not present

## 2020-05-02 DIAGNOSIS — D631 Anemia in chronic kidney disease: Secondary | ICD-10-CM | POA: Diagnosis not present

## 2020-05-02 DIAGNOSIS — E876 Hypokalemia: Secondary | ICD-10-CM | POA: Diagnosis not present

## 2020-05-02 DIAGNOSIS — Z23 Encounter for immunization: Secondary | ICD-10-CM | POA: Diagnosis not present

## 2020-05-02 DIAGNOSIS — Z992 Dependence on renal dialysis: Secondary | ICD-10-CM | POA: Diagnosis not present

## 2020-05-04 DIAGNOSIS — N2581 Secondary hyperparathyroidism of renal origin: Secondary | ICD-10-CM | POA: Diagnosis not present

## 2020-05-04 DIAGNOSIS — D631 Anemia in chronic kidney disease: Secondary | ICD-10-CM | POA: Diagnosis not present

## 2020-05-04 DIAGNOSIS — N186 End stage renal disease: Secondary | ICD-10-CM | POA: Diagnosis not present

## 2020-05-04 DIAGNOSIS — Z23 Encounter for immunization: Secondary | ICD-10-CM | POA: Diagnosis not present

## 2020-05-04 DIAGNOSIS — E876 Hypokalemia: Secondary | ICD-10-CM | POA: Diagnosis not present

## 2020-05-04 DIAGNOSIS — Z992 Dependence on renal dialysis: Secondary | ICD-10-CM | POA: Diagnosis not present

## 2020-05-06 DIAGNOSIS — N186 End stage renal disease: Secondary | ICD-10-CM | POA: Diagnosis not present

## 2020-05-06 DIAGNOSIS — Z992 Dependence on renal dialysis: Secondary | ICD-10-CM | POA: Diagnosis not present

## 2020-05-06 DIAGNOSIS — E876 Hypokalemia: Secondary | ICD-10-CM | POA: Diagnosis not present

## 2020-05-06 DIAGNOSIS — Z23 Encounter for immunization: Secondary | ICD-10-CM | POA: Diagnosis not present

## 2020-05-06 DIAGNOSIS — D631 Anemia in chronic kidney disease: Secondary | ICD-10-CM | POA: Diagnosis not present

## 2020-05-06 DIAGNOSIS — N2581 Secondary hyperparathyroidism of renal origin: Secondary | ICD-10-CM | POA: Diagnosis not present

## 2020-05-07 NOTE — Progress Notes (Signed)
Cardiology Office Note   Date:  05/07/2020   ID:  Wallis, Spizzirri 21-Dec-1940, MRN [id number]  PCP:  Josetta Huddle, MD    No chief complaint on file.  CAD  Wt Readings from Last 3 Encounters:  03/17/20 161 lb 6 oz (73.2 kg)  04/23/19 161 lb 6.4 oz (73.2 kg)  08/31/18 160 lb (72.6 kg)       History of Present Illness: [name] is a 79 y.o. female  with a history of coronary artery disease in Jan 2013- "coronary artery bypass graft surgery x3 using a left internal mammary artery graft to the left anterior descending coronary artery with a saphenous vein graft to the diagonal branch of the LAD, and a saphenous vein graft to the right coronary artery.  Endoscopic vein harvesting from the right leg."   She also has ESRD. She had an episode of right-sided weakness and confusion in December 2016. This was concerning for a TIA. She has a history of postoperative atrial fibrillation after bypass surgery several years ago. The concern was then made that perhaps she had paroxysmal atrial fibrillation, and embolism and her TIA symptoms. Her neurologic symptoms have improved significantly.    A monitor was placedin early 2017. Preliminary results show no evidence of atrial fibrillation. The thought was that if she did have evidence of atrial fibrillation, she would require anticoagulation, likely with Coumadin due to her renal insufficiency. In the interim, she has only been on aspirin.  She had a fall while in [place] in 7/18. She had some type of ICH. She was at [facility].   She was anemic and this improved with iron IV. Hbg increased to >11, from 9.1.   She has a fistula placed in 2015. This was revised in 2019. Cath in 2019 for cardiac arrest:  Ost 1st Mrg lesion is 75% stenosed.  Prox Cx lesion is 100% stenosed. Left to left collaterals. Small AV groove circumflex.  Ost 1st Diag lesion is 100% stenosed. SVG to diag is patent.  Prox LAD  lesion is 95% stenosed. LIMA to LAD is patent.  Prox RCA lesion is 95% stenosed. Mid Graft lesion is 95% stenosed in SVG to PLA.  A drug-eluting stent was successfully placed using a STENT RESOLUTE ONYX 2.0X15.  Post intervention, there is a 0% residual stenosis.  LV end diastolic pressure is normal.  There is no aortic valve stenosis.  Calcified femoral artery.   Denies : Exertional Chest pain. Dizziness. Leg edema. Nitroglycerin use. Orthopnea. Palpitations. Paroxysmal nocturnal dyspnea. Syncope.   If she gets stressed, she can feel some SHOB and tightness.    She needs to have another fistula in the right arm.       Past Medical History:  Diagnosis Date  . Anemia   . Anxiety   . Arthritis   . Atrial fibrillation ([place])   . Blood transfusion   . C. difficile diarrhea   . CAD (coronary artery disease)   . Carotid artery occlusion    Carotid Endartectom,y - left 2009.  Blockage Right being watched by Dr Scot Dock.  . Carotid stenosis   . 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 Complication of anesthesia    pt. states that she was difficult to wake  . Depression   . Diabetes mellitus without complication ([place])   . Diverticulosis   . Dysrhythmia   . General weakness 12/2015  . GERD (gastroesophageal reflux disease)   .  Glaucoma   . History of hiatal hernia   . History of kidney stones    passed  . History of pneumonia   . HOH (hard of hearing)   . Hypertension   . Hypokalemia 12/2015  . Melanoma ([place])    .  top of head- melonoma  . Myocardial infarction ([place])   . Neuromuscular disorder ([place])    CARPEL TUNNEL  . Pneumonia   . Shortness of breath   . 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 Stroke ([place])    hx of TIA    Past Surgical History:  Procedure Laterality Date  . A/V FISTULAGRAM N/A 07/21/2017   Procedure: A/V FISTULAGRAM - Left Arm;  Surgeon: Angelia Mould, MD;  Location: [place] [facility];  Service: Cardiovascular;  Laterality:  N/A;  . A/V FISTULAGRAM Left 03/17/2020   Procedure: A/V Fistulagram;  Surgeon: Angelia Mould, MD;  Location: [place] [facility];  Service: Cardiovascular;  Laterality: Left;  arm  . AV FISTULA PLACEMENT Left 05/31/2014   Procedure: Creation of Left Arm arteriovenous brachiocephalic Fistula;  Surgeon: Angelia Mould, MD;  Location: [place];  Service: Vascular;  Laterality: Left;  . AV FISTULA PLACEMENT Left 09/01/2014   Procedure: INSERTION OF ARTERIOVENOUS (AV) GORE-TEX GRAFT LEFT UPPER ARM USING  4-7 MM X 45 CM SRTETCH GORETEX GRAFT;  Surgeon: Angelia Mould, MD;  Location: [place];  Service: Vascular;  Laterality: Left;  . BACK SURGERY    . CARDIAC CATHETERIZATION    . CAROTID ENDARTERECTOMY Left 2009    CEA  . carpel tunnel    . COLONOSCOPY  07/25/2011   Procedure: COLONOSCOPY;  Surgeon: Winfield Cunas., MD;  Location: [facility] ENDOSCOPY;  Service: Endoscopy;  Laterality: N/A;  . CORONARY ARTERY BYPASS GRAFT  07/29/2011   Procedure: CORONARY ARTERY BYPASS GRAFTING (CABG);  Surgeon: Gaye Pollack, MD;  Location: [place];  Service: Open Heart Surgery;  Laterality: N/A;  . CORONARY STENT INTERVENTION N/A 04/08/2018   Procedure: CORONARY STENT INTERVENTION;  Surgeon: Jettie Booze, MD;  Location: [place] CV [facility];  Service: Cardiovascular;  Laterality: N/A;  . ENDARTERECTOMY Right 04/21/2015   Procedure: ENDARTERECTOMY CAROTID;  Surgeon: Angelia Mould, MD;  Location: [place];  Service: Vascular;  Laterality: Right;  . ESOPHAGOGASTRODUODENOSCOPY  07/25/2011   Procedure: ESOPHAGOGASTRODUODENOSCOPY (EGD);  Surgeon: Winfield Cunas., MD;  Location: [facility] ENDOSCOPY;  Service: Endoscopy;  Laterality: N/A;  . EYE SURGERY    . FISTULOGRAM Left 08/29/2014   Procedure: FISTULOGRAM;  Surgeon: Angelia Mould, MD;  Location: [facility] CATH LAB;  Service: Cardiovascular;  Laterality: Left;  . KNEE SURGERY Left   . LEFT HEART CATH AND CORS/GRAFTS ANGIOGRAPHY N/A 04/08/2018    Procedure: LEFT HEART CATH AND CORS/GRAFTS ANGIOGRAPHY;  Surgeon: Jettie Booze, MD;  Location: [place] CV [facility];  Service: Cardiovascular;  Laterality: N/A;  . LUMBAR LAMINECTOMY/DECOMPRESSION MICRODISCECTOMY N/A 12/01/2015   Procedure: L5-S1 Decompression and Bilateral Microdiscectomy;  Surgeon: Marybelle Killings, MD;  Location: [place];  Service: Orthopedics;  Laterality: N/A;  . NECK SURGERY    . PERIPHERAL VASCULAR BALLOON ANGIOPLASTY  07/21/2017   Procedure: PERIPHERAL VASCULAR BALLOON ANGIOPLASTY;  Surgeon: Angelia Mould, MD;  Location: [place] CV [facility];  Service: Cardiovascular;;  LT Arm AVF  . TONSILLECTOMY       Current Outpatient Medications  Medication Sig Dispense Refill  . acetaminophen (TYLENOL) 325 MG tablet Take 2 tablets (650 mg  total) by mouth every 6 (six) hours as needed for mild pain.    Marland Kitchen acidophilus (RISAQUAD) CAPS capsule Take 1 capsule by mouth daily.    Marland Kitchen allopurinol (ZYLOPRIM) 100 MG tablet Take 100 mg by mouth daily.     Marland Kitchen atorvastatin (LIPITOR) 40 MG tablet Take 1 tablet (40 mg total) by mouth daily at 6 PM. 30 tablet 1  . B Complex-C-Folic Acid (NEPHRO VITAMINS) 0.8 MG TABS Take 0.8 mg by mouth daily.    Marland Kitchen b complex-C-folic acid 1 MG capsule Take 1 capsule by mouth daily.    . brimonidine (ALPHAGAN P) 0.1 % SOLN Place 1 drop into the left eye 2 (two) times daily.    . calcium carbonate (TUMS - DOSED IN MG ELEMENTAL CALCIUM) 500 MG chewable tablet Chew 1 tablet (200 mg of elemental calcium total) by mouth every 6 (six) hours as needed for indigestion or heartburn. (Patient taking differently: Chew 1-2 tablets by mouth every 6 (six) hours as needed for indigestion or heartburn. )    . Cholecalciferol (VITAMIN D) 2000 UNITS tablet Take 2,000 Units by mouth 2 (two) times daily.    . cinacalcet (SENSIPAR) 30 MG tablet Take 30 mg by mouth every Monday, Wednesday, and Friday.    . citalopram (CELEXA) 20 MG tablet Take 20 mg by mouth daily.    . clopidogrel  (PLAVIX) 75 MG tablet Take 1 tablet (75 mg total) by mouth daily. 90 tablet 3  . colchicine 0.6 MG tablet Take 0.6 mg by mouth 2 (two) times daily as needed (for gout flares).     . Cyanocobalamin (B-12) 5000 MCG CAPS Take 5,000 mcg by mouth daily.    . hydrALAZINE (APRESOLINE) 25 MG tablet TAKE 1 TABLET BY MOUTH TWICE DAILY. ON DAYS OF DIALYSIS TAKE 1 TABLET BY MOUTH IN THE EVENING (Patient taking differently: Take 25 mg by mouth See admin instructions. Take 25 mg by mouth twice daily, except on Tuesday, Thursday, Saturday take 25 mg in the evening) 180 tablet 1  . hydrocortisone (ANUSOL-HC) 2.5 % rectal cream Place 1 application rectally 2 (two) times daily. Apply to a glycerin suppository and insert rectally. 30 g 1  . hydrocortisone (ANUSOL-HC) 25 MG suppository Place 1 suppository (25 mg total) rectally every 12 (twelve) hours. (Patient not taking: Reported on 03/14/2020) 12 suppository 1  . Hydrocortisone (GERHARDT'S BUTT CREAM) CREA Apply 5 application topically as needed for irritation. 5 each 1  . Insulin Glargine (LANTUS) 100 UNIT/ML Solostar Pen Inject 15 Units into the skin daily. (Patient taking differently: Inject 0-15 Units into the skin daily. ) 15 mL 11  . Insulin Pen Needle (PEN NEEDLES) 31G X 8 MM MISC 15 Units by Does not apply route daily. 100 each 9  . nitroGLYCERIN (NITROSTAT) 0.4 MG SL tablet Place 1 tablet (0.4 mg total) under the tongue every 5 (five) minutes as needed for chest pain. 25 tablet 3  . Omega-3 Fatty Acids (FISH OIL) 1200 MG CAPS Take 1,200 mg by mouth 2 (two) times daily.    Marland Kitchen omeprazole (PRILOSEC) 20 MG capsule Take 20 mg by mouth daily as needed (acid reflux).     . polycarbophil (FIBERCON) 625 MG tablet Take 1 tablet (625 mg total) by mouth daily. (Patient taking differently: Take 625 mg by mouth in the morning and at bedtime. ) 30 tablet 0  . saccharomyces boulardii (FLORASTOR) 250 MG capsule Take 1 capsule (250 mg total) by mouth 2 (two) times daily. (Patient  not taking:  Reported on 03/14/2020) 60 capsule 0  . sevelamer carbonate (RENVELA) 800 MG tablet Take 1 tablet (800 mg total) by mouth 3 (three) times daily with meals. (Patient taking differently: Take 1,600 mg by mouth 2 (two) times daily with a meal. ) 90 tablet 0  . timolol (TIMOPTIC) 0.5 % ophthalmic solution Place 1 drop into the left eye daily.    . travoprost, benzalkonium, (TRAVATAN) 0.004 % ophthalmic solution Place 1 drop into both eyes at bedtime.      Current Facility-Administered Medications  Medication Dose Route Frequency Provider Last Rate Last Admin  . 0.9 %  sodium chloride infusion  250 mL Intravenous PRN Angelia Mould, MD        Allergies:   Neurontin [gabapentin], Sulfa antibiotics, and Norvasc [amlodipine]    Social History:  The patient  reports that she has never smoked. She has never used smokeless tobacco. She reports that she does not drink alcohol and does not use drugs.   Family History:  The patient's family history includes Colon polyps in her father; Deep vein thrombosis in her daughter; Diabetes in her daughter and mother; Heart attack in her mother; Heart disease in her daughter and mother; Hyperlipidemia in her daughter and father; Hypertension in her daughter, father, and mother; Lung cancer in her father; Peripheral vascular disease in her daughter; Stroke in her mother.    ROS:  Please see the history of present illness.   Otherwise, review of systems are positive for DOE.   All other systems are reviewed and negative.    PHYSICAL EXAM: VS:  There were no vitals taken for this visit. , BMI There is no height or weight on file to calculate BMI. GEN: Well nourished, well developed, in no acute distress  HEENT: normal  Neck: no JVD;, left carotid bruit present, or masses Cardiac: RRR; no murmurs, rubs, or gallops,no edema  Respiratory:  clear to auscultation bilaterally, normal work of breathing GI: soft, nontender, nondistended, + BS MS: no  deformity or atrophy ; left arm fistula- palpable thrill Skin: warm and dry, no rash Neuro:  Strength and sensation are intact Psych: euthymic mood, full affect   Recent Labs: 03/17/2020: BUN 32; Creatinine, Ser 5.10; Hemoglobin 11.6; Potassium 5.4; Sodium 135   Lipid Panel    Component Value Date/Time   CHOL 177 07/13/2015 1630   TRIG 280 (H) 07/13/2015 1630   HDL 40 (L) 07/13/2015 1630   CHOLHDL 4.4 07/13/2015 1630   VLDL 56 (H) 07/13/2015 1630   LDLCALC 81 07/13/2015 1630     Other studies Reviewed: Additional studies/ records that were reviewed today with results demonstrating: labs reviewed- .   ASSESSMENT AND PLAN:  1. CAD: Continue aggressive secondary prevention.  Check echo given DOE. EF 45% in 2019. 2. ESRD: Needs new fistula.  I suspect she will be ok for surgery.  3. Hyperlipidemia: Whole fod, plant based diet. 4. PAF: No palpitations.    Current medicines are reviewed at length with the patient today.  The patient concerns regarding her medicines were addressed.  The following changes have been made:  No change  Labs/ tests ordered today include:  No orders of the defined types were placed in this encounter.   Recommend 150 minutes/week of aerobic exercise Low fat, low carb, high fiber diet recommended  Disposition:   FU for echo   Signed, Larae Grooms, MD  05/07/2020 10:15 PM    [facility] [place], [place]  [address] Phone: [phone]; Fax: [phone]

## 2020-05-08 ENCOUNTER — Encounter: Payer: Self-pay | Admitting: Interventional Cardiology

## 2020-05-08 ENCOUNTER — Ambulatory Visit (INDEPENDENT_AMBULATORY_CARE_PROVIDER_SITE_OTHER): Payer: Medicare Other | Admitting: Interventional Cardiology

## 2020-05-08 ENCOUNTER — Other Ambulatory Visit: Payer: Self-pay

## 2020-05-08 VITALS — BP 102/54 | HR 63 | Ht 64.0 in | Wt 170.8 lb

## 2020-05-08 DIAGNOSIS — I25118 Atherosclerotic heart disease of native coronary artery with other forms of angina pectoris: Secondary | ICD-10-CM | POA: Diagnosis not present

## 2020-05-08 DIAGNOSIS — E782 Mixed hyperlipidemia: Secondary | ICD-10-CM

## 2020-05-08 DIAGNOSIS — N185 Chronic kidney disease, stage 5: Secondary | ICD-10-CM | POA: Diagnosis not present

## 2020-05-08 DIAGNOSIS — I480 Paroxysmal atrial fibrillation: Secondary | ICD-10-CM | POA: Diagnosis not present

## 2020-05-08 MED ORDER — CLOPIDOGREL BISULFATE 75 MG PO TABS
75.0000 mg | ORAL_TABLET | Freq: Every day | ORAL | 3 refills | Status: DC
Start: 2020-05-08 — End: 2020-10-12

## 2020-05-08 MED ORDER — NITROGLYCERIN 0.4 MG SL SUBL: 0.4000 mg | SUBLINGUAL_TABLET | SUBLINGUAL | 3 refills | Status: DC | PRN

## 2020-05-08 NOTE — Patient Instructions (Signed)
Medication Instructions:  Your physician recommends that you continue on your current medications as directed. Please refer to the Current Medication list given to you today.  *If you need a refill on your cardiac medications before your next appointment, please call your pharmacy*   Lab Work: None   If you have labs (blood work) drawn today and your tests are completely normal, you will receive your results only by: . MyChart Message (if you have MyChart) OR . A paper copy in the mail If you have any lab test that is abnormal or we need to change your treatment, we will call you to review the results.   Testing/Procedures: Your physician has requested that you have an echocardiogram. Echocardiography is a painless test that uses sound waves to create images of your heart. It provides your doctor with information about the size and shape of your heart and how well your heart's chambers and valves are working. This procedure takes approximately one hour. There are no restrictions for this procedure.  Follow-Up: At [facility], you and your health needs are our priority.  As part of our continuing mission to provide you with exceptional heart care, we have created designated Provider Care Teams.  These Care Teams include your primary Cardiologist (physician) and Advanced Practice Providers (APPs -  Physician Assistants and Nurse Practitioners) who all work together to provide you with the care you need, when you need it.  We recommend signing up for the patient portal called "MyChart".  Sign up information is provided on this After Visit Summary.  MyChart is used to connect with patients for Virtual Visits (Telemedicine).  Patients are able to view lab/test results, encounter notes, upcoming appointments, etc.  Non-urgent messages can be sent to your provider as well.   To learn more about what you can do with MyChart, go to [email or web address].    Your next appointment:   12  month(s)  The format for your next appointment:   In Person  Provider:   You may see Jayadeep Varanasi, MD or one of the following Advanced Practice Providers on your designated Care Team:    Dayna Dunn, PA-C  Michele Lenze, PA-C  Other Instructions None  

## 2020-05-09 DIAGNOSIS — Z992 Dependence on renal dialysis: Secondary | ICD-10-CM | POA: Diagnosis not present

## 2020-05-09 DIAGNOSIS — N2581 Secondary hyperparathyroidism of renal origin: Secondary | ICD-10-CM | POA: Diagnosis not present

## 2020-05-09 DIAGNOSIS — D631 Anemia in chronic kidney disease: Secondary | ICD-10-CM | POA: Diagnosis not present

## 2020-05-09 DIAGNOSIS — E876 Hypokalemia: Secondary | ICD-10-CM | POA: Diagnosis not present

## 2020-05-09 DIAGNOSIS — N186 End stage renal disease: Secondary | ICD-10-CM | POA: Diagnosis not present

## 2020-05-09 DIAGNOSIS — Z23 Encounter for immunization: Secondary | ICD-10-CM | POA: Diagnosis not present

## 2020-05-11 DIAGNOSIS — D631 Anemia in chronic kidney disease: Secondary | ICD-10-CM | POA: Diagnosis not present

## 2020-05-11 DIAGNOSIS — E876 Hypokalemia: Secondary | ICD-10-CM | POA: Diagnosis not present

## 2020-05-11 DIAGNOSIS — Z23 Encounter for immunization: Secondary | ICD-10-CM | POA: Diagnosis not present

## 2020-05-11 DIAGNOSIS — Z992 Dependence on renal dialysis: Secondary | ICD-10-CM | POA: Diagnosis not present

## 2020-05-11 DIAGNOSIS — N186 End stage renal disease: Secondary | ICD-10-CM | POA: Diagnosis not present

## 2020-05-11 DIAGNOSIS — N2581 Secondary hyperparathyroidism of renal origin: Secondary | ICD-10-CM | POA: Diagnosis not present

## 2020-05-15 DIAGNOSIS — Z992 Dependence on renal dialysis: Secondary | ICD-10-CM | POA: Diagnosis not present

## 2020-05-15 DIAGNOSIS — E1129 Type 2 diabetes mellitus with other diabetic kidney complication: Secondary | ICD-10-CM | POA: Diagnosis not present

## 2020-05-15 DIAGNOSIS — N186 End stage renal disease: Secondary | ICD-10-CM | POA: Diagnosis not present

## 2020-05-16 DIAGNOSIS — Z992 Dependence on renal dialysis: Secondary | ICD-10-CM | POA: Diagnosis not present

## 2020-05-16 DIAGNOSIS — N186 End stage renal disease: Secondary | ICD-10-CM | POA: Diagnosis not present

## 2020-05-16 DIAGNOSIS — T82868A Thrombosis of vascular prosthetic devices, implants and grafts, initial encounter: Secondary | ICD-10-CM | POA: Diagnosis not present

## 2020-05-16 DIAGNOSIS — T82858A Stenosis of vascular prosthetic devices, implants and grafts, initial encounter: Secondary | ICD-10-CM | POA: Diagnosis not present

## 2020-05-17 ENCOUNTER — Other Ambulatory Visit: Payer: Self-pay

## 2020-05-17 ENCOUNTER — Ambulatory Visit (HOSPITAL_COMMUNITY)
Admission: RE | Admit: 2020-05-17 | Discharge: 2020-05-17 | Disposition: A | Discharge status: Home / Self Care | Payer: Medicare Other | Source: Ambulatory Visit | Attending: Vascular Surgery | Admitting: Vascular Surgery

## 2020-05-17 ENCOUNTER — Ambulatory Visit (INDEPENDENT_AMBULATORY_CARE_PROVIDER_SITE_OTHER): Payer: Medicare Other | Admitting: Vascular Surgery

## 2020-05-17 ENCOUNTER — Ambulatory Visit (INDEPENDENT_AMBULATORY_CARE_PROVIDER_SITE_OTHER)
Admission: RE | Admit: 2020-05-17 | Discharge: 2020-05-17 | Disposition: A | Discharge status: Home / Self Care | Payer: Medicare Other | Source: Ambulatory Visit | Attending: Vascular Surgery | Admitting: Vascular Surgery

## 2020-05-17 ENCOUNTER — Encounter: Payer: Self-pay | Admitting: Vascular Surgery

## 2020-05-17 VITALS — BP 156/77 | HR 65 | Temp 97.3°F | Resp 20 | Ht 64.0 in | Wt 170.0 lb

## 2020-05-17 DIAGNOSIS — N186 End stage renal disease: Secondary | ICD-10-CM | POA: Diagnosis not present

## 2020-05-17 DIAGNOSIS — Z992 Dependence on renal dialysis: Secondary | ICD-10-CM

## 2020-05-17 DIAGNOSIS — I25118 Atherosclerotic heart disease of native coronary artery with other forms of angina pectoris: Secondary | ICD-10-CM | POA: Diagnosis not present

## 2020-05-17 NOTE — Progress Notes (Signed)
REASON FOR VISIT:   To evaluate for new hemodialysis access.   MEDICAL ISSUES:   END-STAGE RENAL DISEASE: The patient continues to have some steal symptoms in the left upper extremity.  After extensive discussion with the patient and her daughter she would like to proceed with access in the right arm so that ultimately the access on the left can be ligated or if this fails she will have an another option for dialysis.  Based on her vein map she would require either a for stage basilic vein transposition and then subsequent second stage if the vein matured, versus an AV graft in the right arm.  I have discussed the indications for the procedure and the potential complications and she is agreeable to proceed.  However her husband has critical limb ischemia and is scheduled for an arteriogram this Friday to work on weight to see what happens there so that the family is not overwhelmed with too many issues at once.  Hopefully in the near future we can schedule her procedure in the right arm.  She dialyzes on Tuesdays Thursdays and Saturdays.    HPI:   [name] is a pleasant 79 y.o. female who comes in to be evaluated for new hemodialysis access.  The patient presented with steal symptoms in her left upper extremity.  She had symptoms for a year.  She stated that the fistula has been working well although sometimes it was difficult to cannulate.  She underwent a fistulogram on 03/17/2020.  This showed no significant central venous stenosis on the left.  There was only a very mild subclavian vein stenosis.  This did not appear to be significant as the fistula had an excellent thrill and was not pulsatile.  The left upper arm graft was widely patent.  There were no problems at the arterial anastomosis.  There was no evidence of occlusive disease in the brachial artery adjacent to the anastomosis.  At the time of her fistulogram, her steal symptoms were stable.  I discussed having her come to the  office for a vein map of the right upper extremity and arterial duplex study to evaluate her for access in the right arm.  We did we could then place access in the right arm and they could continue to use the access in the left arm until the right arm was ready to be used.  We could then ligate the left arm graft.  Since I saw her last her access clotted and she underwent thrombolysis at [facility] yesterday.  I removed her sutures in the office today for her.  She continues to have some coolness in the left hand and paresthesias when on dialysis.  Past Medical History:  Diagnosis Date  . Anemia   . Anxiety   . Arthritis   . Atrial fibrillation ([place])   . Blood transfusion   . C. difficile diarrhea   . CAD (coronary artery disease)   . Carotid artery occlusion    Carotid Endartectom,y - left 2009.  Blockage Right being watched by Dr Scot Dock.  . Carotid stenosis   . 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 Complication of anesthesia    pt. states that she was difficult to wake  . Depression   . Diabetes mellitus without complication ([place])   . Diverticulosis   . Dysrhythmia   . General weakness 12/2015  . GERD (gastroesophageal reflux disease)   . Glaucoma   . History  of hiatal hernia   . History of kidney stones    passed  . History of pneumonia   . HOH (hard of hearing)   . Hypertension   . Hypokalemia 12/2015  . Melanoma ([place])    .  top of head- melonoma  . Myocardial infarction ([place])   . Neuromuscular disorder ([place])    CARPEL TUNNEL  . Pneumonia   . Shortness of breath   . 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 Stroke ([place])    hx of TIA    Family History  Problem Relation Age of Onset  . Diabetes Mother   . Hypertension Mother   . Heart disease Mother        before age 39  . Heart attack Mother   . Stroke Mother   . Hyperlipidemia Father   . Hypertension Father   . Colon polyps Father   . Lung cancer Father   . Deep vein thrombosis Daughter   .  Diabetes Daughter   . Hyperlipidemia Daughter   . Hypertension Daughter   . Heart disease Daughter   . Peripheral vascular disease Daughter   . Breast cancer Neg Hx     SOCIAL HISTORY: Social History   Tobacco Use  . Smoking status: Never Smoker  . Smokeless tobacco: Never Used  Substance Use Topics  . Alcohol use: No    Alcohol/week: 0.0 standard drinks    Allergies  Allergen Reactions  . Neurontin [Gabapentin] Other (See Comments)    Hallucinations and "Makes me go crazy"   . Sulfa Antibiotics Other (See Comments)    Altered mental state   . Norvasc [Amlodipine] Other (See Comments)    "Makes my legs swell"; edema    Current Outpatient Medications  Medication Sig Dispense Refill  . acetaminophen (TYLENOL) 325 MG tablet Take 2 tablets (650 mg total) by mouth every 6 (six) hours as needed for mild pain.    Marland Kitchen acidophilus (RISAQUAD) CAPS capsule Take 1 capsule by mouth daily.    Marland Kitchen allopurinol (ZYLOPRIM) 100 MG tablet Take 100 mg by mouth daily.     Marland Kitchen atorvastatin (LIPITOR) 40 MG tablet Take 1 tablet (40 mg total) by mouth daily at 6 PM. 30 tablet 1  . B Complex-C-Folic Acid (NEPHRO VITAMINS) 0.8 MG TABS Take 0.8 mg by mouth daily.    Marland Kitchen b complex-C-folic acid 1 MG capsule Take 1 capsule by mouth daily.    . brimonidine (ALPHAGAN P) 0.1 % SOLN Place 1 drop into the left eye 2 (two) times daily.    . calcium carbonate (TUMS - DOSED IN MG ELEMENTAL CALCIUM) 500 MG chewable tablet Chew 1 tablet (200 mg of elemental calcium total) by mouth every 6 (six) hours as needed for indigestion or heartburn. (Patient taking differently: Chew 1-2 tablets by mouth every 6 (six) hours as needed for indigestion or heartburn. )    . Cholecalciferol (VITAMIN D) 2000 UNITS tablet Take 2,000 Units by mouth 2 (two) times daily.    . cinacalcet (SENSIPAR) 30 MG tablet Take 30 mg by mouth every Monday, Wednesday, and Friday.    . citalopram (CELEXA) 20 MG tablet Take 20 mg by mouth daily.    .  clopidogrel (PLAVIX) 75 MG tablet Take 1 tablet (75 mg total) by mouth daily. 90 tablet 3  . colchicine 0.6 MG tablet Take 0.6 mg by mouth 2 (two) times daily as needed (for gout flares).     . Cyanocobalamin (B-12)  5000 MCG CAPS Take 5,000 mcg by mouth daily.    . hydrALAZINE (APRESOLINE) 25 MG tablet TAKE 1 TABLET BY MOUTH TWICE DAILY. ON DAYS OF DIALYSIS TAKE 1 TABLET BY MOUTH IN THE EVENING (Patient taking differently: Take 25 mg by mouth See admin instructions. Take 25 mg by mouth twice daily, except on Tuesday, Thursday, Saturday take 25 mg in the evening) 180 tablet 1  . hydrocortisone (ANUSOL-HC) 2.5 % rectal cream Place 1 application rectally 2 (two) times daily. Apply to a glycerin suppository and insert rectally. 30 g 1  . hydrocortisone (ANUSOL-HC) 25 MG suppository Place 1 suppository (25 mg total) rectally every 12 (twelve) hours. 12 suppository 1  . Hydrocortisone (GERHARDT'S BUTT CREAM) CREA Apply 5 application topically as needed for irritation. 5 each 1  . Insulin Glargine (LANTUS) 100 UNIT/ML Solostar Pen Inject 15 Units into the skin daily. (Patient taking differently: Inject 0-15 Units into the skin daily. ) 15 mL 11  . Insulin Pen Needle (PEN NEEDLES) 31G X 8 MM MISC 15 Units by Does not apply route daily. 100 each 9  . nitroGLYCERIN (NITROSTAT) 0.4 MG SL tablet Place 1 tablet (0.4 mg total) under the tongue every 5 (five) minutes as needed for chest pain. 25 tablet 3  . Omega-3 Fatty Acids (FISH OIL) 1200 MG CAPS Take 1,200 mg by mouth 2 (two) times daily.    Marland Kitchen omeprazole (PRILOSEC) 20 MG capsule Take 20 mg by mouth daily as needed (acid reflux).     . polycarbophil (FIBERCON) 625 MG tablet Take 1 tablet (625 mg total) by mouth daily. (Patient taking differently: Take 625 mg by mouth in the morning and at bedtime. ) 30 tablet 0  . saccharomyces boulardii (FLORASTOR) 250 MG capsule Take 1 capsule (250 mg total) by mouth 2 (two) times daily. (Patient taking differently: Take 250 mg  by mouth daily. ) 60 capsule 0  . sevelamer carbonate (RENVELA) 800 MG tablet Take 1 tablet (800 mg total) by mouth 3 (three) times daily with meals. (Patient taking differently: Take 1,600 mg by mouth 2 (two) times daily with a meal. ) 90 tablet 0  . timolol (TIMOPTIC) 0.5 % ophthalmic solution Place 1 drop into the left eye daily.    . travoprost, benzalkonium, (TRAVATAN) 0.004 % ophthalmic solution Place 1 drop into both eyes at bedtime.      Current Facility-Administered Medications  Medication Dose Route Frequency Provider Last Rate Last Admin  . 0.9 %  sodium chloride infusion  250 mL Intravenous PRN Angelia Mould, MD        REVIEW OF SYSTEMS:  [X]  denotes positive finding, [ ]  denotes negative finding Cardiac  Comments:  Chest pain or chest pressure:    Shortness of breath upon exertion:    Short of breath when lying flat:    Irregular heart rhythm:        Vascular    Pain in calf, thigh, or hip brought on by ambulation:    Pain in feet at night that wakes you up from your sleep:     Blood clot in your veins:    Leg swelling:         Pulmonary    Oxygen at home:    Productive cough:     Wheezing:         Neurologic    Sudden weakness in arms or legs:     Sudden numbness in arms or legs:     Sudden onset of difficulty  speaking or slurred speech:    Temporary loss of vision in one eye:     Problems with dizziness:         Gastrointestinal    Blood in stool:     Vomited blood:         Genitourinary    Burning when urinating:     Blood in urine:        Psychiatric    Major depression:         Hematologic    Bleeding problems:    Problems with blood clotting too easily:        Skin    Rashes or ulcers:        Constitutional    Fever or chills:     PHYSICAL EXAM:   Vitals:   05/17/20 1350  BP: (!) 156/77  Pulse: 65  Resp: 20  Temp: (!) 97.3 F (36.3 C)  SpO2: 98%  Weight: 170 lb (77.1 kg)  Height: 5\' 4"  (1.626 m)    GENERAL: The patient  is a well-nourished female, in no acute distress. The vital signs are documented above. CARDIAC: There is a regular rate and rhythm.  VASCULAR: She has a thrill in her left upper arm graft. PULMONARY: There is good air exchange bilaterally without wheezing or rales.  DATA:    UPPER EXTREMITY ARTERIAL DUPLEX: I have independently interpreted her upper extremity arterial duplex scan today.  On the right side there is a biphasic radial and ulnar signal.  The brachial artery measures 0.37 cm in maximum diameter.  On the left side there is a biphasic radial and ulnar signal.  The brachial artery measures 0.41 cm in diameter.  UPPER EXTREMITY VEIN MAP: I have independently interpreted her upper extremity vein mapping.  On the right side the forearm cephalic vein looks very small.  The upper arm cephalic vein looks small.  The basilic vein looks marginal in size.    [name] [facility] [phone]

## 2020-05-18 DIAGNOSIS — Z992 Dependence on renal dialysis: Secondary | ICD-10-CM | POA: Diagnosis not present

## 2020-05-18 DIAGNOSIS — D631 Anemia in chronic kidney disease: Secondary | ICD-10-CM | POA: Diagnosis not present

## 2020-05-18 DIAGNOSIS — N186 End stage renal disease: Secondary | ICD-10-CM | POA: Diagnosis not present

## 2020-05-18 DIAGNOSIS — N2581 Secondary hyperparathyroidism of renal origin: Secondary | ICD-10-CM | POA: Diagnosis not present

## 2020-05-18 DIAGNOSIS — E876 Hypokalemia: Secondary | ICD-10-CM | POA: Diagnosis not present

## 2020-05-20 DIAGNOSIS — Z992 Dependence on renal dialysis: Secondary | ICD-10-CM | POA: Diagnosis not present

## 2020-05-20 DIAGNOSIS — E876 Hypokalemia: Secondary | ICD-10-CM | POA: Diagnosis not present

## 2020-05-20 DIAGNOSIS — N186 End stage renal disease: Secondary | ICD-10-CM | POA: Diagnosis not present

## 2020-05-20 DIAGNOSIS — N2581 Secondary hyperparathyroidism of renal origin: Secondary | ICD-10-CM | POA: Diagnosis not present

## 2020-05-20 DIAGNOSIS — D631 Anemia in chronic kidney disease: Secondary | ICD-10-CM | POA: Diagnosis not present

## 2020-05-23 DIAGNOSIS — D631 Anemia in chronic kidney disease: Secondary | ICD-10-CM | POA: Diagnosis not present

## 2020-05-23 DIAGNOSIS — E11319 Type 2 diabetes mellitus with unspecified diabetic retinopathy without macular edema: Secondary | ICD-10-CM | POA: Diagnosis not present

## 2020-05-23 DIAGNOSIS — N186 End stage renal disease: Secondary | ICD-10-CM | POA: Diagnosis not present

## 2020-05-23 DIAGNOSIS — E1121 Type 2 diabetes mellitus with diabetic nephropathy: Secondary | ICD-10-CM | POA: Diagnosis not present

## 2020-05-23 DIAGNOSIS — E0840 Diabetes mellitus due to underlying condition with diabetic neuropathy, unspecified: Secondary | ICD-10-CM | POA: Diagnosis not present

## 2020-05-23 DIAGNOSIS — I10 Essential (primary) hypertension: Secondary | ICD-10-CM | POA: Diagnosis not present

## 2020-05-23 DIAGNOSIS — E782 Mixed hyperlipidemia: Secondary | ICD-10-CM | POA: Diagnosis not present

## 2020-05-23 DIAGNOSIS — I25111 Atherosclerotic heart disease of native coronary artery with angina pectoris with documented spasm: Secondary | ICD-10-CM | POA: Diagnosis not present

## 2020-05-23 DIAGNOSIS — N184 Chronic kidney disease, stage 4 (severe): Secondary | ICD-10-CM | POA: Diagnosis not present

## 2020-05-23 DIAGNOSIS — E876 Hypokalemia: Secondary | ICD-10-CM | POA: Diagnosis not present

## 2020-05-23 DIAGNOSIS — N2581 Secondary hyperparathyroidism of renal origin: Secondary | ICD-10-CM | POA: Diagnosis not present

## 2020-05-23 DIAGNOSIS — I609 Nontraumatic subarachnoid hemorrhage, unspecified: Secondary | ICD-10-CM | POA: Diagnosis not present

## 2020-05-23 DIAGNOSIS — E7800 Pure hypercholesterolemia, unspecified: Secondary | ICD-10-CM | POA: Diagnosis not present

## 2020-05-23 DIAGNOSIS — E1165 Type 2 diabetes mellitus with hyperglycemia: Secondary | ICD-10-CM | POA: Diagnosis not present

## 2020-05-23 DIAGNOSIS — Z992 Dependence on renal dialysis: Secondary | ICD-10-CM | POA: Diagnosis not present

## 2020-05-23 DIAGNOSIS — E1140 Type 2 diabetes mellitus with diabetic neuropathy, unspecified: Secondary | ICD-10-CM | POA: Diagnosis not present

## 2020-05-25 DIAGNOSIS — N2581 Secondary hyperparathyroidism of renal origin: Secondary | ICD-10-CM | POA: Diagnosis not present

## 2020-05-25 DIAGNOSIS — E876 Hypokalemia: Secondary | ICD-10-CM | POA: Diagnosis not present

## 2020-05-25 DIAGNOSIS — Z992 Dependence on renal dialysis: Secondary | ICD-10-CM | POA: Diagnosis not present

## 2020-05-25 DIAGNOSIS — N186 End stage renal disease: Secondary | ICD-10-CM | POA: Diagnosis not present

## 2020-05-25 DIAGNOSIS — D631 Anemia in chronic kidney disease: Secondary | ICD-10-CM | POA: Diagnosis not present

## 2020-05-29 ENCOUNTER — Other Ambulatory Visit (HOSPITAL_COMMUNITY): Payer: Medicare Other

## 2020-05-30 DIAGNOSIS — Z992 Dependence on renal dialysis: Secondary | ICD-10-CM | POA: Diagnosis not present

## 2020-05-30 DIAGNOSIS — E876 Hypokalemia: Secondary | ICD-10-CM | POA: Diagnosis not present

## 2020-05-30 DIAGNOSIS — N186 End stage renal disease: Secondary | ICD-10-CM | POA: Diagnosis not present

## 2020-05-30 DIAGNOSIS — N2581 Secondary hyperparathyroidism of renal origin: Secondary | ICD-10-CM | POA: Diagnosis not present

## 2020-05-30 DIAGNOSIS — D631 Anemia in chronic kidney disease: Secondary | ICD-10-CM | POA: Diagnosis not present

## 2020-05-31 ENCOUNTER — Other Ambulatory Visit: Payer: Self-pay

## 2020-05-31 ENCOUNTER — Ambulatory Visit (HOSPITAL_COMMUNITY): Payer: Medicare Other | Attending: Internal Medicine

## 2020-05-31 DIAGNOSIS — E782 Mixed hyperlipidemia: Secondary | ICD-10-CM | POA: Diagnosis not present

## 2020-05-31 DIAGNOSIS — I25118 Atherosclerotic heart disease of native coronary artery with other forms of angina pectoris: Secondary | ICD-10-CM | POA: Insufficient documentation

## 2020-05-31 DIAGNOSIS — N185 Chronic kidney disease, stage 5: Secondary | ICD-10-CM | POA: Insufficient documentation

## 2020-05-31 DIAGNOSIS — I480 Paroxysmal atrial fibrillation: Secondary | ICD-10-CM

## 2020-05-31 LAB — ECHOCARDIOGRAM COMPLETE
Area-P 1/2: 2.5 cm2
S' Lateral: 3.6 cm

## 2020-06-01 DIAGNOSIS — N2581 Secondary hyperparathyroidism of renal origin: Secondary | ICD-10-CM | POA: Diagnosis not present

## 2020-06-01 DIAGNOSIS — Z992 Dependence on renal dialysis: Secondary | ICD-10-CM | POA: Diagnosis not present

## 2020-06-01 DIAGNOSIS — D631 Anemia in chronic kidney disease: Secondary | ICD-10-CM | POA: Diagnosis not present

## 2020-06-01 DIAGNOSIS — E876 Hypokalemia: Secondary | ICD-10-CM | POA: Diagnosis not present

## 2020-06-01 DIAGNOSIS — N186 End stage renal disease: Secondary | ICD-10-CM | POA: Diagnosis not present

## 2020-06-03 DIAGNOSIS — N2581 Secondary hyperparathyroidism of renal origin: Secondary | ICD-10-CM | POA: Diagnosis not present

## 2020-06-03 DIAGNOSIS — D631 Anemia in chronic kidney disease: Secondary | ICD-10-CM | POA: Diagnosis not present

## 2020-06-03 DIAGNOSIS — N186 End stage renal disease: Secondary | ICD-10-CM | POA: Diagnosis not present

## 2020-06-03 DIAGNOSIS — Z992 Dependence on renal dialysis: Secondary | ICD-10-CM | POA: Diagnosis not present

## 2020-06-03 DIAGNOSIS — E876 Hypokalemia: Secondary | ICD-10-CM | POA: Diagnosis not present

## 2020-06-05 DIAGNOSIS — N186 End stage renal disease: Secondary | ICD-10-CM | POA: Diagnosis not present

## 2020-06-05 DIAGNOSIS — Z992 Dependence on renal dialysis: Secondary | ICD-10-CM | POA: Diagnosis not present

## 2020-06-05 DIAGNOSIS — D631 Anemia in chronic kidney disease: Secondary | ICD-10-CM | POA: Diagnosis not present

## 2020-06-05 DIAGNOSIS — E876 Hypokalemia: Secondary | ICD-10-CM | POA: Diagnosis not present

## 2020-06-05 DIAGNOSIS — N2581 Secondary hyperparathyroidism of renal origin: Secondary | ICD-10-CM | POA: Diagnosis not present

## 2020-06-07 DIAGNOSIS — N186 End stage renal disease: Secondary | ICD-10-CM | POA: Diagnosis not present

## 2020-06-07 DIAGNOSIS — E876 Hypokalemia: Secondary | ICD-10-CM | POA: Diagnosis not present

## 2020-06-07 DIAGNOSIS — Z992 Dependence on renal dialysis: Secondary | ICD-10-CM | POA: Diagnosis not present

## 2020-06-07 DIAGNOSIS — D631 Anemia in chronic kidney disease: Secondary | ICD-10-CM | POA: Diagnosis not present

## 2020-06-07 DIAGNOSIS — N2581 Secondary hyperparathyroidism of renal origin: Secondary | ICD-10-CM | POA: Diagnosis not present

## 2020-06-10 DIAGNOSIS — Z992 Dependence on renal dialysis: Secondary | ICD-10-CM | POA: Diagnosis not present

## 2020-06-10 DIAGNOSIS — N2581 Secondary hyperparathyroidism of renal origin: Secondary | ICD-10-CM | POA: Diagnosis not present

## 2020-06-10 DIAGNOSIS — E876 Hypokalemia: Secondary | ICD-10-CM | POA: Diagnosis not present

## 2020-06-10 DIAGNOSIS — N186 End stage renal disease: Secondary | ICD-10-CM | POA: Diagnosis not present

## 2020-06-10 DIAGNOSIS — D631 Anemia in chronic kidney disease: Secondary | ICD-10-CM | POA: Diagnosis not present

## 2020-06-13 DIAGNOSIS — N186 End stage renal disease: Secondary | ICD-10-CM | POA: Diagnosis not present

## 2020-06-13 DIAGNOSIS — E876 Hypokalemia: Secondary | ICD-10-CM | POA: Diagnosis not present

## 2020-06-13 DIAGNOSIS — D631 Anemia in chronic kidney disease: Secondary | ICD-10-CM | POA: Diagnosis not present

## 2020-06-13 DIAGNOSIS — N2581 Secondary hyperparathyroidism of renal origin: Secondary | ICD-10-CM | POA: Diagnosis not present

## 2020-06-13 DIAGNOSIS — Z992 Dependence on renal dialysis: Secondary | ICD-10-CM | POA: Diagnosis not present

## 2020-06-14 DIAGNOSIS — Z992 Dependence on renal dialysis: Secondary | ICD-10-CM | POA: Diagnosis not present

## 2020-06-14 DIAGNOSIS — E1129 Type 2 diabetes mellitus with other diabetic kidney complication: Secondary | ICD-10-CM | POA: Diagnosis not present

## 2020-06-14 DIAGNOSIS — N186 End stage renal disease: Secondary | ICD-10-CM | POA: Diagnosis not present

## 2020-06-15 DIAGNOSIS — N2581 Secondary hyperparathyroidism of renal origin: Secondary | ICD-10-CM | POA: Diagnosis not present

## 2020-06-15 DIAGNOSIS — E876 Hypokalemia: Secondary | ICD-10-CM | POA: Diagnosis not present

## 2020-06-15 DIAGNOSIS — D631 Anemia in chronic kidney disease: Secondary | ICD-10-CM | POA: Diagnosis not present

## 2020-06-15 DIAGNOSIS — N186 End stage renal disease: Secondary | ICD-10-CM | POA: Diagnosis not present

## 2020-06-15 DIAGNOSIS — Z992 Dependence on renal dialysis: Secondary | ICD-10-CM | POA: Diagnosis not present

## 2020-06-16 ENCOUNTER — Other Ambulatory Visit: Payer: Self-pay | Admitting: *Deleted

## 2020-06-16 ENCOUNTER — Encounter: Payer: Self-pay | Admitting: *Deleted

## 2020-06-17 DIAGNOSIS — D631 Anemia in chronic kidney disease: Secondary | ICD-10-CM | POA: Diagnosis not present

## 2020-06-17 DIAGNOSIS — N2581 Secondary hyperparathyroidism of renal origin: Secondary | ICD-10-CM | POA: Diagnosis not present

## 2020-06-17 DIAGNOSIS — E876 Hypokalemia: Secondary | ICD-10-CM | POA: Diagnosis not present

## 2020-06-17 DIAGNOSIS — N186 End stage renal disease: Secondary | ICD-10-CM | POA: Diagnosis not present

## 2020-06-17 DIAGNOSIS — Z992 Dependence on renal dialysis: Secondary | ICD-10-CM | POA: Diagnosis not present

## 2020-06-20 DIAGNOSIS — D631 Anemia in chronic kidney disease: Secondary | ICD-10-CM | POA: Diagnosis not present

## 2020-06-20 DIAGNOSIS — N2581 Secondary hyperparathyroidism of renal origin: Secondary | ICD-10-CM | POA: Diagnosis not present

## 2020-06-20 DIAGNOSIS — E876 Hypokalemia: Secondary | ICD-10-CM | POA: Diagnosis not present

## 2020-06-20 DIAGNOSIS — Z992 Dependence on renal dialysis: Secondary | ICD-10-CM | POA: Diagnosis not present

## 2020-06-20 DIAGNOSIS — N186 End stage renal disease: Secondary | ICD-10-CM | POA: Diagnosis not present

## 2020-06-22 DIAGNOSIS — D631 Anemia in chronic kidney disease: Secondary | ICD-10-CM | POA: Diagnosis not present

## 2020-06-22 DIAGNOSIS — Z992 Dependence on renal dialysis: Secondary | ICD-10-CM | POA: Diagnosis not present

## 2020-06-22 DIAGNOSIS — N186 End stage renal disease: Secondary | ICD-10-CM | POA: Diagnosis not present

## 2020-06-22 DIAGNOSIS — N2581 Secondary hyperparathyroidism of renal origin: Secondary | ICD-10-CM | POA: Diagnosis not present

## 2020-06-22 DIAGNOSIS — E876 Hypokalemia: Secondary | ICD-10-CM | POA: Diagnosis not present

## 2020-06-24 DIAGNOSIS — Z992 Dependence on renal dialysis: Secondary | ICD-10-CM | POA: Diagnosis not present

## 2020-06-24 DIAGNOSIS — N2581 Secondary hyperparathyroidism of renal origin: Secondary | ICD-10-CM | POA: Diagnosis not present

## 2020-06-24 DIAGNOSIS — N186 End stage renal disease: Secondary | ICD-10-CM | POA: Diagnosis not present

## 2020-06-24 DIAGNOSIS — D631 Anemia in chronic kidney disease: Secondary | ICD-10-CM | POA: Diagnosis not present

## 2020-06-24 DIAGNOSIS — E876 Hypokalemia: Secondary | ICD-10-CM | POA: Diagnosis not present

## 2020-06-27 DIAGNOSIS — D631 Anemia in chronic kidney disease: Secondary | ICD-10-CM | POA: Diagnosis not present

## 2020-06-27 DIAGNOSIS — E876 Hypokalemia: Secondary | ICD-10-CM | POA: Diagnosis not present

## 2020-06-27 DIAGNOSIS — N186 End stage renal disease: Secondary | ICD-10-CM | POA: Diagnosis not present

## 2020-06-27 DIAGNOSIS — N2581 Secondary hyperparathyroidism of renal origin: Secondary | ICD-10-CM | POA: Diagnosis not present

## 2020-06-27 DIAGNOSIS — Z992 Dependence on renal dialysis: Secondary | ICD-10-CM | POA: Diagnosis not present

## 2020-06-28 DIAGNOSIS — N184 Chronic kidney disease, stage 4 (severe): Secondary | ICD-10-CM | POA: Diagnosis not present

## 2020-06-28 DIAGNOSIS — E11319 Type 2 diabetes mellitus with unspecified diabetic retinopathy without macular edema: Secondary | ICD-10-CM | POA: Diagnosis not present

## 2020-06-28 DIAGNOSIS — K219 Gastro-esophageal reflux disease without esophagitis: Secondary | ICD-10-CM | POA: Diagnosis not present

## 2020-06-28 DIAGNOSIS — E0840 Diabetes mellitus due to underlying condition with diabetic neuropathy, unspecified: Secondary | ICD-10-CM | POA: Diagnosis not present

## 2020-06-28 DIAGNOSIS — I2510 Atherosclerotic heart disease of native coronary artery without angina pectoris: Secondary | ICD-10-CM | POA: Diagnosis not present

## 2020-06-28 DIAGNOSIS — E781 Pure hyperglyceridemia: Secondary | ICD-10-CM | POA: Diagnosis not present

## 2020-06-28 DIAGNOSIS — I25111 Atherosclerotic heart disease of native coronary artery with angina pectoris with documented spasm: Secondary | ICD-10-CM | POA: Diagnosis not present

## 2020-06-28 DIAGNOSIS — E1140 Type 2 diabetes mellitus with diabetic neuropathy, unspecified: Secondary | ICD-10-CM | POA: Diagnosis not present

## 2020-06-28 DIAGNOSIS — J45991 Cough variant asthma: Secondary | ICD-10-CM | POA: Diagnosis not present

## 2020-06-28 DIAGNOSIS — I609 Nontraumatic subarachnoid hemorrhage, unspecified: Secondary | ICD-10-CM | POA: Diagnosis not present

## 2020-06-28 DIAGNOSIS — E782 Mixed hyperlipidemia: Secondary | ICD-10-CM | POA: Diagnosis not present

## 2020-06-28 DIAGNOSIS — I10 Essential (primary) hypertension: Secondary | ICD-10-CM | POA: Diagnosis not present

## 2020-06-29 DIAGNOSIS — N186 End stage renal disease: Secondary | ICD-10-CM | POA: Diagnosis not present

## 2020-06-29 DIAGNOSIS — Z992 Dependence on renal dialysis: Secondary | ICD-10-CM | POA: Diagnosis not present

## 2020-06-29 DIAGNOSIS — D631 Anemia in chronic kidney disease: Secondary | ICD-10-CM | POA: Diagnosis not present

## 2020-06-29 DIAGNOSIS — N2581 Secondary hyperparathyroidism of renal origin: Secondary | ICD-10-CM | POA: Diagnosis not present

## 2020-06-29 DIAGNOSIS — E876 Hypokalemia: Secondary | ICD-10-CM | POA: Diagnosis not present

## 2020-06-30 ENCOUNTER — Other Ambulatory Visit: Payer: Self-pay

## 2020-06-30 ENCOUNTER — Encounter (HOSPITAL_COMMUNITY): Payer: Self-pay | Admitting: Vascular Surgery

## 2020-06-30 NOTE — Progress Notes (Signed)
Spoke with pt for pre-op call. Pt states her memory is not very good and her family usually does most things for her. She does deny any recent chest pain. States she gets short of breath most days. Pt is on dialysis on Tu/Th/Sat. Pt has an extensive cardiac history. Pt is on Plavix and Dr. Nicole Cella office instructed pt to stop it 5 days prior to surgery. She states her daughter fixes her medications and she has no idea if she has taken the Plavix out of her pill box. Pt is a type 2 diabetic. She does not check her fasting blood sugar. Pt is on Lantus at night. She states her husband checks her blood sugar at night and sometimes he gives her the Lantus and sometimes he doesn't. She doesn't know what makes him decide what to give her. No recent A1C noted in Epic or Care Everywhere.  I told pt that I would call her daughter, [name] and give her the pre-op instructions and also ask about the Plavix. She told pt is at work, but might could call me back if I left her a message on her cell phone. I have called and left her a message to call me back.   Pt states she knows she has an appt for Covid testing in the AM. She states she understands that she quarantines after the test is done and until she comes to the hospital. Pt will have to go to Dialysis tomorrow.

## 2020-06-30 NOTE — Progress Notes (Signed)
Anesthesia Chart Review: [name]   Case: [id number] Date/Time: 07/03/20 1252   Procedure: BASCILIC VEIN TRANSPOSITION VS GRAFT (Right )   Anesthesia type: Choice   Pre-op diagnosis: esrd   Location: [facility] OR ROOM 12 / [facility]   Surgeons: Angelia Mould, MD      DISCUSSION: Patient is an 79 year old female scheduled for the above procedure.  History includes never smoker, CAD (s/p CABG 07/29/11: LIMA-LAD; SVG-DIAG; SVG-RCA; DES SVG-RPAV 04/08/18), HTN, DM2, carotid artery occlusive disease (left CEA 2009; right CEA 04/21/15), atrial fibrillation (post-CABG), TIA (2016), glaucoma, anemia, ESRD, GERD, hiatal hernia,  melanoma (head), dyspnea, hard of hearing, depression, back surgery (L5-S1 microdiscectomy 12/01/15), fall with ICH (bifrontal SAH 01/25/17, [place]). Reports prior history of being difficult to wake up after anesthesia.  Last evaluation by cardiologist Dr. Irish Lack 05/08/2020.  Continue aggressive secondary prevention for CAD.  Echo ordered given exertional dyspnea.  EF improved from 45 to 50% to 50 to 55% since 2019.  He notes she needs to have another fistula in her right arm, and wrote "I suspect she will be okay for surgery."  Her last DES was in 2019. She reports that per [facility] she is to hold Plavix 5 days prior to surgery.  COVID-19 test is scheduled for 07/01/2020.  She is on HD TTS.  Anesthesia team to evaluate on the day of surgery.   VS:  BP Readings from Last 3 Encounters:  05/17/20 (!) 156/77  05/08/20 (!) 102/54  03/17/20 (!) 150/75   Pulse Readings from Last 3 Encounters:  05/17/20 65  05/08/20 63  03/17/20 64    PROVIDERS: Josetta Huddle, MD is PCP Larae Grooms, MD is cardiologist Rosalin Hawking, MD is neurologist. Last visit 07/22/17.   LABS: For day of surgery.    EKG: 03/17/20: Normal sinus rhythm Left axis deviation Right bundle branch block T wave abnormality, consider inferolateral ischemia Abnormal ECG Confirmed by Loralie Champagne  ([id number]) on 03/19/2020 9:14:12 PM - RBBB and inferolateral T wave abnormality also noted on 08/19/18 tracing   CV: Echo 05/31/20: IMPRESSIONS  1. Left ventricular ejection fraction, by estimation, is 50 to 55%. The  left ventricle has low normal function. The left ventricle demonstrates  regional wall motion abnormalities (see scoring diagram/findings for  description). Left ventricular diastolic  parameters are consistent with Grade I diastolic dysfunction (impaired  relaxation). Elevated left ventricular end-diastolic pressure. There is  severe akinesis of the left ventricular, basal-mid inferior wall and  inferolateral wall.  2. Right ventricular systolic function is normal. The right ventricular  size is normal.  3. Small PFO suspected by color doppler -consider bubble study if  indicated. Evidence of atrial level shunting detected by color flow  Doppler.  4. Moderate mitral subvalvular calcification.  5. The mitral valve is abnormal. Mild mitral valve regurgitation.  6. The aortic valve is tricuspid. Aortic valve regurgitation is not  visualized.  7. The inferior vena cava is normal in size with greater than 50%  respiratory variability, suggesting right atrial pressure of 3 mmHg.  - Comparison(s): 04/03/18 EF 45-50%.    Cardiac cath/PCI 04/08/18:  Colon Flattery 1st Mrg lesion is 75% stenosed.  Prox Cx lesion is 100% stenosed. Left to left collaterals. Small AV groove circumflex.  Ost 1st Diag lesion is 100% stenosed. SVG to diag is patent.  Prox LAD lesion is 95% stenosed. LIMA to LAD is patent.  Prox RCA lesion is 95% stenosed. Mid Graft lesion is 95% stenosed in SVG to PLA.  A drug-eluting stent was successfully placed using a STENT RESOLUTE ONYX 2.0X15.  Post intervention, there is a 0% residual stenosis.  LV end diastolic pressure is normal.  There is no aortic valve stenosis.  Calcified femoral artery. Recommend uninterrupted dual antiplatelet therapy with Aspirin  81mg  daily and Ticagrelor 90mg  twice daily for a minimum of 12 months (ACS - Class I recommendation).  - Continue aggressive secondary prevention.     Carotid U/S 03/06/17:  Impression:  Patent bilateral carotid endarterectomy without evidence of restenosis or hyperplasia.  Elevated velocities noted in the left mid internal carotid artery, due to tortuous vessel without evidence of stenosis. No significant change noted when compared to previous exam on 11/08/2015.     30 day event monitor 07/27/15-08/25/15:   No pathologic arrhythmias noted.  No atrial fibrillation. Continue medical therapy    Past Medical History:  Diagnosis Date  . Anemia   . Anxiety   . Arthritis   . Atrial fibrillation ([place])   . Blood transfusion   . C. difficile diarrhea   . CAD (coronary artery disease)   . Carotid artery occlusion    Carotid Endartectom,y - left 2009.  Blockage Right being watched by Dr Scot Dock.  . Carotid stenosis   . 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 Complication of anesthesia    pt. states that she was difficult to wake  . Depression   . Diabetes mellitus without complication ([place])   . Diverticulosis   . Dysrhythmia   . General weakness 12/2015  . GERD (gastroesophageal reflux disease)   . Glaucoma   . History of hiatal hernia   . History of kidney stones    passed  . History of pneumonia   . HOH (hard of hearing)   . Hypertension   . Hypokalemia 12/2015  . Melanoma ([place])    .  top of head- melonoma  . Myocardial infarction ([place])   . 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 Pneumonia   . Shortness of breath   . 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 Stroke ([place])    hx of TIA    Past Surgical History:  Procedure Laterality Date  . A/V FISTULAGRAM N/A 07/21/2017   Procedure: A/V FISTULAGRAM - Left Arm;  Surgeon: Angelia Mould, MD;  Location: [facility];  Service: Cardiovascular;  Laterality: N/A;  . A/V FISTULAGRAM Left  03/17/2020   Procedure: A/V Fistulagram;  Surgeon: Angelia Mould, MD;  Location: [facility];  Service: Cardiovascular;  Laterality: Left;  arm  . AV FISTULA PLACEMENT Left 05/31/2014   Procedure: Creation of Left Arm arteriovenous brachiocephalic Fistula;  Surgeon: Angelia Mould, MD;  Location: [facility];  Service: Vascular;  Laterality: Left;  . AV FISTULA PLACEMENT Left 09/01/2014   Procedure: INSERTION OF ARTERIOVENOUS (AV) GORE-TEX GRAFT LEFT UPPER ARM USING  4-7 MM X 45 CM SRTETCH GORETEX GRAFT;  Surgeon: Angelia Mould, MD;  Location: [facility];  Service: Vascular;  Laterality: Left;  . BACK SURGERY    . CARDIAC CATHETERIZATION    . CAROTID ENDARTERECTOMY Left 2009    CEA  . carpel tunnel    . COLONOSCOPY  07/25/2011   Procedure: COLONOSCOPY;  Surgeon: Winfield Cunas., MD;  Location: [facility]COPY;  Service: Endoscopy;  Laterality: N/A;  . CORONARY ARTERY BYPASS GRAFT  07/29/2011   Procedure: CORONARY ARTERY BYPASS GRAFTING (CABG);  Surgeon:  Gaye Pollack, MD;  Location: [facility];  Service: Open Heart Surgery;  Laterality: N/A;  . CORONARY STENT INTERVENTION N/A 04/08/2018   Procedure: CORONARY STENT INTERVENTION;  Surgeon: Jettie Booze, MD;  Location: [facility];  Service: Cardiovascular;  Laterality: N/A;  . ENDARTERECTOMY Right 04/21/2015   Procedure: ENDARTERECTOMY CAROTID;  Surgeon: Angelia Mould, MD;  Location: [facility];  Service: Vascular;  Laterality: Right;  . ESOPHAGOGASTRODUODENOSCOPY  07/25/2011   Procedure: ESOPHAGOGASTRODUODENOSCOPY (EGD);  Surgeon: Winfield Cunas., MD;  Location[facility];  Service: Endoscopy;  Laterality: N/A;  . EYE SURGERY    . FISTULOGRAM Left 08/29/2014   Procedure: FISTULOGRAM;  Surgeon: Angelia Mould, MD;  Location: [facility] C[facility] LAB;  Service: Cardiovascular;  Laterality: Left;  . KNEE SURGERY Left   . LEFT HEART CATH AND CORS/GRAFTS ANGIOGRAPHY N/A 04/08/2018   Procedure: LEFT HEART CATH AND  CORS/GRAFTS ANGIOGRAPHY;  Surgeon: Jettie Booze, MD;  Location: [facility];  Service: Cardiovascular;  Laterality: N/A;  . LUMBAR LAMINECTOMY/DECOMPRESSION MICRODISCECTOMY N/A 12/01/2015   Procedure: L5-S1 Decompression and Bilateral Microdiscectomy;  Surgeon: Marybelle Killings, MD;  Location: [facility];  Service: Orthopedics;  Laterality: N/A;  . NECK SURGERY    . PERIPHERAL VASCULAR BALLOON ANGIOPLASTY  07/21/2017   Procedure: PERIPHERAL VASCULAR BALLOON ANGIOPLASTY;  Surgeon: Angelia Mould, MD;  Location: [facility];  Service: Cardiovascular;;  LT Arm AVF  . TONSILLECTOMY      MEDICATIONS: . 0.9 %  sodium chloride infusion   . acetaminophen (TYLENOL) 500 MG tablet  . allopurinol (ZYLOPRIM) 100 MG tablet  . atorvastatin (LIPITOR) 40 MG tablet  . B Complex-C-Folic Acid (NEPHRO VITAMINS) 0.8 MG TABS  . b complex-C-folic acid 1 MG capsule  . brimonidine (ALPHAGAN P) 0.1 % SOLN  . calcium carbonate (TUMS - DOSED IN MG ELEMENTAL CALCIUM) 500 MG chewable tablet  . Cholecalciferol (VITAMIN D) 2000 UNITS tablet  . cinacalcet (SENSIPAR) 30 MG tablet  . citalopram (CELEXA) 20 MG tablet  . clopidogrel (PLAVIX) 75 MG tablet  . colchicine 0.6 MG tablet  . Cyanocobalamin (B-12) 2500 MCG TABS  . famotidine (PEPCID) 20 MG tablet  . hydrALAZINE (APRESOLINE) 25 MG tablet  . hydrocortisone (ANUSOL-HC) 2.5 % rectal cream  . hydrocortisone (ANUSOL-HC) 25 MG suppository  . Hydrocortisone (GERHARDT'S BUTT CREAM) CREA  . Insulin Glargine (LANTUS) 100 UNIT/ML Solostar Pen  . nitroGLYCERIN (NITROSTAT) 0.4 MG SL tablet  . Omega-3 Fatty Acids (FISH OIL) 1200 MG CAPS  . polycarbophil (FIBERCON) 625 MG tablet  . Probiotic Product (PROBIOTIC DAILY PO)  . sevelamer carbonate (RENVELA) 800 MG tablet  . timolol (TIMOPTIC) 0.5 % ophthalmic solution  . travoprost, benzalkonium, (TRAVATAN) 0.004 % ophthalmic solution  . acetaminophen (TYLENOL) 325 MG tablet  . Insulin Pen Needle (PEN  NEEDLES) 31G X 8 MM MISC  . saccharomyces boulardii (FLORASTOR) 250 MG capsule    Myra Gianotti, PA-C Surgical Short Stay/Anesthesiology [facility] Phone [phone] [facility] Phone [phone] 06/30/2020 3:02 PM

## 2020-06-30 NOTE — Progress Notes (Signed)
Was able to contact pt's daughter Holland Commons. Pt has stopped her Plavix, last dose was 06/26/20. Instructed Millie to give pt 1/2 of her regular dose (sliding scale) of Lantus Sunday evening. Instructed her to check pt's blood sugar Monday AM when she gets up and every 2 hours until she leaves for the hospital.  If blood sugar is 70 or below, treat with 1/2 cup of clear juice (apple or cranberry) and recheck blood sugar 15 minutes after drinking juice. If blood sugar continues to be 70 or below, call the Short Stay department and ask to speak to a nurse. Millie voiced understanding.  Millie states pt will get her Covid test in the AM and then will quarantine except for going to Dialysis on tomorrow.

## 2020-06-30 NOTE — Anesthesia Preprocedure Evaluation (Addendum)
Anesthesia Evaluation  Patient identified by MRN, date of birth, ID band Patient awake    Reviewed: Allergy & Precautions, H&P , NPO status , Patient's Chart, lab work & pertinent test results  Airway Mallampati: II  TM Distance: >3 FB Neck ROM: Full    Dental no notable dental hx. (+) Partial Lower, Partial Upper, Dental Advisory Given   Pulmonary neg pulmonary ROS,    Pulmonary exam normal breath sounds clear to auscultation       Cardiovascular hypertension, Pt. on medications + CAD, + Past MI, + Cardiac Stents and + CABG  + dysrhythmias Atrial Fibrillation  Rhythm:Regular Rate:Normal     Neuro/Psych Anxiety Depression CVA    GI/Hepatic Neg liver ROS, hiatal hernia, GERD  ,  Endo/Other  diabetes, Insulin Dependent  Renal/GU ESRF and DialysisRenal disease  negative genitourinary   Musculoskeletal  (+) Arthritis , Osteoarthritis,    Abdominal   Peds  Hematology  (+) Blood dyscrasia, anemia ,   Anesthesia Other Findings   Reproductive/Obstetrics negative OB ROS                            Anesthesia Physical Anesthesia Plan  ASA: III  Anesthesia Plan: MAC and Regional   Post-op Pain Management:    Induction: Intravenous  PONV Risk Score and Plan: 2 and Propofol infusion and Ondansetron  Airway Management Planned: Simple Face Mask  Additional Equipment:   Intra-op Plan:   Post-operative Plan:   Informed Consent: I have reviewed the patients History and Physical, chart, labs and discussed the procedure including the risks, benefits and alternatives for the proposed anesthesia with the patient or authorized representative who has indicated his/her understanding and acceptance.     Dental advisory given  Plan Discussed with: CRNA  Anesthesia Plan Comments: (PAT note written 06/30/2020 by Myra Gianotti, PA-C. )       Anesthesia Quick Evaluation

## 2020-07-01 ENCOUNTER — Other Ambulatory Visit (HOSPITAL_COMMUNITY)
Admission: RE | Admit: 2020-07-01 | Discharge: 2020-07-01 | Disposition: A | Discharge status: Home / Self Care | Payer: Medicare Other | Source: Ambulatory Visit | Attending: Vascular Surgery | Admitting: Vascular Surgery

## 2020-07-01 DIAGNOSIS — Z992 Dependence on renal dialysis: Secondary | ICD-10-CM | POA: Diagnosis not present

## 2020-07-01 DIAGNOSIS — Z01812 Encounter for preprocedural laboratory examination: Secondary | ICD-10-CM | POA: Diagnosis not present

## 2020-07-01 DIAGNOSIS — N186 End stage renal disease: Secondary | ICD-10-CM | POA: Diagnosis not present

## 2020-07-01 DIAGNOSIS — D631 Anemia in chronic kidney disease: Secondary | ICD-10-CM | POA: Diagnosis not present

## 2020-07-01 DIAGNOSIS — N2581 Secondary hyperparathyroidism of renal origin: Secondary | ICD-10-CM | POA: Diagnosis not present

## 2020-07-01 DIAGNOSIS — Z20822 Contact with and (suspected) exposure to covid-19: Secondary | ICD-10-CM | POA: Insufficient documentation

## 2020-07-01 DIAGNOSIS — E876 Hypokalemia: Secondary | ICD-10-CM | POA: Diagnosis not present

## 2020-07-01 LAB — SARS CORONAVIRUS 2 (TAT 6-24 HRS): SARS Coronavirus 2: NEGATIVE

## 2020-07-03 ENCOUNTER — Other Ambulatory Visit: Payer: Self-pay

## 2020-07-03 ENCOUNTER — Ambulatory Visit (HOSPITAL_COMMUNITY)
Admission: RE | Admit: 2020-07-03 | Discharge: 2020-07-03 | Disposition: A | Discharge status: Home / Self Care | Payer: Medicare Other | Attending: Vascular Surgery | Admitting: Vascular Surgery

## 2020-07-03 ENCOUNTER — Encounter (HOSPITAL_COMMUNITY): Payer: Self-pay | Admitting: Vascular Surgery

## 2020-07-03 ENCOUNTER — Ambulatory Visit (HOSPITAL_COMMUNITY): Payer: Medicare Other | Admitting: Vascular Surgery

## 2020-07-03 ENCOUNTER — Encounter (HOSPITAL_COMMUNITY)
Admission: RE | Disposition: A | Discharge status: Home / Self Care | Payer: Self-pay | Source: Home / Self Care | Attending: Vascular Surgery

## 2020-07-03 DIAGNOSIS — Z882 Allergy status to sulfonamides status: Secondary | ICD-10-CM | POA: Insufficient documentation

## 2020-07-03 DIAGNOSIS — N186 End stage renal disease: Secondary | ICD-10-CM | POA: Diagnosis not present

## 2020-07-03 DIAGNOSIS — I2510 Atherosclerotic heart disease of native coronary artery without angina pectoris: Secondary | ICD-10-CM | POA: Insufficient documentation

## 2020-07-03 DIAGNOSIS — Z955 Presence of coronary angioplasty implant and graft: Secondary | ICD-10-CM | POA: Insufficient documentation

## 2020-07-03 DIAGNOSIS — Z992 Dependence on renal dialysis: Secondary | ICD-10-CM | POA: Diagnosis not present

## 2020-07-03 DIAGNOSIS — Z85820 Personal history of malignant melanoma of skin: Secondary | ICD-10-CM | POA: Insufficient documentation

## 2020-07-03 DIAGNOSIS — Z888 Allergy status to other drugs, medicaments and biological substances status: Secondary | ICD-10-CM | POA: Diagnosis not present

## 2020-07-03 DIAGNOSIS — Z79899 Other long term (current) drug therapy: Secondary | ICD-10-CM | POA: Insufficient documentation

## 2020-07-03 DIAGNOSIS — Z951 Presence of aortocoronary bypass graft: Secondary | ICD-10-CM | POA: Insufficient documentation

## 2020-07-03 DIAGNOSIS — I4891 Unspecified atrial fibrillation: Secondary | ICD-10-CM | POA: Diagnosis not present

## 2020-07-03 DIAGNOSIS — Z794 Long term (current) use of insulin: Secondary | ICD-10-CM | POA: Insufficient documentation

## 2020-07-03 DIAGNOSIS — E1122 Type 2 diabetes mellitus with diabetic chronic kidney disease: Secondary | ICD-10-CM | POA: Insufficient documentation

## 2020-07-03 DIAGNOSIS — Z8673 Personal history of transient ischemic attack (TIA), and cerebral infarction without residual deficits: Secondary | ICD-10-CM | POA: Diagnosis not present

## 2020-07-03 DIAGNOSIS — K219 Gastro-esophageal reflux disease without esophagitis: Secondary | ICD-10-CM | POA: Diagnosis not present

## 2020-07-03 DIAGNOSIS — I120 Hypertensive chronic kidney disease with stage 5 chronic kidney disease or end stage renal disease: Secondary | ICD-10-CM | POA: Insufficient documentation

## 2020-07-03 HISTORY — PX: BASCILIC VEIN TRANSPOSITION: SHX5742

## 2020-07-03 LAB — POCT I-STAT, CHEM 8
BUN: 26 mg/dL — ABNORMAL HIGH (ref 8–23)
Calcium, Ion: 1.15 mmol/L (ref 1.15–1.40)
Chloride: 101 mmol/L (ref 98–111)
Creatinine, Ser: 5.1 mg/dL — ABNORMAL HIGH (ref 0.44–1.00)
Glucose, Bld: 96 mg/dL (ref 70–99)
HCT: 33 % — ABNORMAL LOW (ref 36.0–46.0)
Hemoglobin: 11.2 g/dL — ABNORMAL LOW (ref 12.0–15.0)
Potassium: 4.9 mmol/L (ref 3.5–5.1)
Sodium: 141 mmol/L (ref 135–145)
TCO2: 30 mmol/L (ref 22–32)

## 2020-07-03 LAB — GLUCOSE, CAPILLARY
Glucose-Capillary: 94 mg/dL (ref 70–99)
Glucose-Capillary: 94 mg/dL (ref 70–99)
Glucose-Capillary: 98 mg/dL (ref 70–99)

## 2020-07-03 SURGERY — TRANSPOSITION, VEIN, BASILIC
Anesthesia: Monitor Anesthesia Care | Site: Arm Upper | Laterality: Right

## 2020-07-03 MED ORDER — ORAL CARE MOUTH RINSE: 15.0000 mL | Freq: Once | OROMUCOSAL | Status: AC

## 2020-07-03 MED ORDER — THROMBIN (RECOMBINANT) 20000 UNITS EX SOLR
CUTANEOUS | Status: AC
  Filled 2020-07-03: qty 20000

## 2020-07-03 MED ORDER — LIDOCAINE HCL (PF) 1 % IJ SOLN
INTRAMUSCULAR | Status: AC
  Filled 2020-07-03: qty 30

## 2020-07-03 MED ORDER — CHLORHEXIDINE GLUCONATE 4 % EX LIQD: 60.0000 mL | Freq: Once | CUTANEOUS | Status: DC

## 2020-07-03 MED ORDER — MIDAZOLAM HCL 2 MG/2ML IJ SOLN
INTRAMUSCULAR | Status: AC
  Administered 2020-07-03: 14:00:00 1 mg via INTRAVENOUS
  Filled 2020-07-03: qty 2

## 2020-07-03 MED ORDER — SODIUM CHLORIDE 0.9 % IV SOLN: INTRAVENOUS | Status: DC

## 2020-07-03 MED ORDER — FENTANYL CITRATE (PF) 100 MCG/2ML IJ SOLN: 50.0000 ug | Freq: Once | INTRAMUSCULAR | Status: AC

## 2020-07-03 MED ORDER — LIDOCAINE-EPINEPHRINE (PF) 1 %-1:200000 IJ SOLN
INTRAMUSCULAR | Status: AC
  Filled 2020-07-03: qty 30

## 2020-07-03 MED ORDER — STERILE WATER FOR IRRIGATION IR SOLN
Status: DC | PRN
  Administered 2020-07-03: 1000 mL

## 2020-07-03 MED ORDER — HEPARIN SODIUM (PORCINE) 1000 UNIT/ML IJ SOLN
INTRAMUSCULAR | Status: AC
  Filled 2020-07-03: qty 1

## 2020-07-03 MED ORDER — FENTANYL CITRATE (PF) 100 MCG/2ML IJ SOLN
INTRAMUSCULAR | Status: AC
  Filled 2020-07-03: qty 2

## 2020-07-03 MED ORDER — CEFAZOLIN SODIUM-DEXTROSE 2-4 GM/100ML-% IV SOLN
2.0000 g | INTRAVENOUS | Status: AC
  Administered 2020-07-03: 15:00:00 2 g via INTRAVENOUS
  Filled 2020-07-03: qty 100

## 2020-07-03 MED ORDER — MIDAZOLAM HCL 2 MG/2ML IJ SOLN
INTRAMUSCULAR | Status: AC
  Filled 2020-07-03: qty 2

## 2020-07-03 MED ORDER — PHENYLEPHRINE HCL-NACL 10-0.9 MG/250ML-% IV SOLN
INTRAVENOUS | Status: DC | PRN
  Administered 2020-07-03: 30 ug/min via INTRAVENOUS

## 2020-07-03 MED ORDER — ONDANSETRON HCL 4 MG/2ML IJ SOLN
INTRAMUSCULAR | Status: DC | PRN
  Administered 2020-07-03: 4 mg via INTRAVENOUS

## 2020-07-03 MED ORDER — DEXAMETHASONE SODIUM PHOSPHATE 10 MG/ML IJ SOLN
INTRAMUSCULAR | Status: DC | PRN
  Administered 2020-07-03: 4 mg via INTRAVENOUS

## 2020-07-03 MED ORDER — SODIUM CHLORIDE 0.9 % IV SOLN: INTRAVENOUS | Status: DC | PRN

## 2020-07-03 MED ORDER — PROTAMINE SULFATE 10 MG/ML IV SOLN
INTRAVENOUS | Status: AC
  Filled 2020-07-03: qty 15

## 2020-07-03 MED ORDER — OXYCODONE HCL 5 MG/5ML PO SOLN: 5.0000 mg | Freq: Once | ORAL | Status: DC | PRN

## 2020-07-03 MED ORDER — 0.9 % SODIUM CHLORIDE (POUR BTL) OPTIME
TOPICAL | Status: DC | PRN
  Administered 2020-07-03: 15:00:00 1000 mL

## 2020-07-03 MED ORDER — BUPIVACAINE HCL (PF) 0.25 % IJ SOLN
INTRAMUSCULAR | Status: DC | PRN
  Administered 2020-07-03: 10 mL

## 2020-07-03 MED ORDER — LIDOCAINE-EPINEPHRINE (PF) 1.5 %-1:200000 IJ SOLN
INTRAMUSCULAR | Status: DC | PRN
  Administered 2020-07-03: 30 mL via PERINEURAL

## 2020-07-03 MED ORDER — OXYCODONE HCL 5 MG PO TABS: 5.0000 mg | ORAL_TABLET | Freq: Once | ORAL | Status: DC | PRN

## 2020-07-03 MED ORDER — OXYCODONE HCL 5 MG PO TABS: 5.0000 mg | ORAL_TABLET | ORAL | 0 refills | Status: DC | PRN

## 2020-07-03 MED ORDER — CHLORHEXIDINE GLUCONATE 0.12 % MT SOLN: 15.0000 mL | Freq: Once | OROMUCOSAL | Status: AC

## 2020-07-03 MED ORDER — PROTAMINE SULFATE 10 MG/ML IV SOLN
INTRAVENOUS | Status: DC | PRN
  Administered 2020-07-03: 40 mg via INTRAVENOUS

## 2020-07-03 MED ORDER — SODIUM CHLORIDE 0.9 % IV SOLN
INTRAVENOUS | Status: AC
  Filled 2020-07-03: qty 1.2

## 2020-07-03 MED ORDER — PROPOFOL 500 MG/50ML IV EMUL
INTRAVENOUS | Status: DC | PRN
  Administered 2020-07-03: 50 ug/kg/min via INTRAVENOUS

## 2020-07-03 MED ORDER — LIDOCAINE HCL (PF) 1 % IJ SOLN
INTRAMUSCULAR | Status: DC | PRN
  Administered 2020-07-03: 10 mL

## 2020-07-03 MED ORDER — MIDAZOLAM HCL 2 MG/2ML IJ SOLN: 1.0000 mg | Freq: Once | INTRAMUSCULAR | Status: AC

## 2020-07-03 MED ORDER — CHLORHEXIDINE GLUCONATE 0.12 % MT SOLN
OROMUCOSAL | Status: AC
  Administered 2020-07-03: 11:00:00 15 mL via OROMUCOSAL
  Filled 2020-07-03: qty 15

## 2020-07-03 MED ORDER — FENTANYL CITRATE (PF) 100 MCG/2ML IJ SOLN
INTRAMUSCULAR | Status: AC
  Administered 2020-07-03: 14:00:00 50 ug via INTRAVENOUS
  Filled 2020-07-03: qty 2

## 2020-07-03 MED ORDER — FENTANYL CITRATE (PF) 100 MCG/2ML IJ SOLN: 25.0000 ug | INTRAMUSCULAR | Status: DC | PRN

## 2020-07-03 MED ORDER — HEPARIN SODIUM (PORCINE) 1000 UNIT/ML IJ SOLN
INTRAMUSCULAR | Status: DC | PRN
  Administered 2020-07-03: 7000 [IU] via INTRAVENOUS

## 2020-07-03 SURGICAL SUPPLY — 32 items
ARMBAND PINK RESTRICT EXTREMIT (MISCELLANEOUS) ×3 IMPLANT
CANISTER SUCT 3000ML PPV (MISCELLANEOUS) ×3 IMPLANT
CANNULA VESSEL 3MM 2 BLNT TIP (CANNULA) ×3 IMPLANT
CLIP VESOCCLUDE MED 24/CT (CLIP) ×3 IMPLANT
CLIP VESOCCLUDE SM WIDE 24/CT (CLIP) ×3 IMPLANT
COVER PROBE W GEL 5X96 (DRAPES) IMPLANT
COVER WAND RF STERILE (DRAPES) ×3 IMPLANT
DECANTER SPIKE VIAL GLASS SM (MISCELLANEOUS) ×3 IMPLANT
DERMABOND ADVANCED (GAUZE/BANDAGES/DRESSINGS) ×2
DERMABOND ADVANCED .7 DNX12 (GAUZE/BANDAGES/DRESSINGS) ×1 IMPLANT
ELECT REM PT RETURN 9FT ADLT (ELECTROSURGICAL) ×3
ELECTRODE REM PT RTRN 9FT ADLT (ELECTROSURGICAL) ×1 IMPLANT
GLOVE BIO SURGEON STRL SZ7.5 (GLOVE) ×3 IMPLANT
GLOVE BIOGEL PI IND STRL 8 (GLOVE) ×1 IMPLANT
GLOVE BIOGEL PI INDICATOR 8 (GLOVE) ×2
GOWN STRL REUS W/ TWL LRG LVL3 (GOWN DISPOSABLE) ×3 IMPLANT
GOWN STRL REUS W/TWL LRG LVL3 (GOWN DISPOSABLE) ×9
KIT BASIN OR (CUSTOM PROCEDURE TRAY) ×3 IMPLANT
KIT TURNOVER KIT B (KITS) ×3 IMPLANT
NS IRRIG 1000ML POUR BTL (IV SOLUTION) ×3 IMPLANT
PACK CV ACCESS (CUSTOM PROCEDURE TRAY) ×3 IMPLANT
PAD ARMBOARD 7.5X6 YLW CONV (MISCELLANEOUS) ×6 IMPLANT
SPONGE SURGIFOAM ABS GEL 100 (HEMOSTASIS) IMPLANT
SUT PROLENE 6 0 BV (SUTURE) ×6 IMPLANT
SUT PROLENE 6 0 C 1 24 (SUTURE) ×6 IMPLANT
SUT SILK 2 0 SH (SUTURE) IMPLANT
SUT VIC AB 3-0 SH 27 (SUTURE) ×6
SUT VIC AB 3-0 SH 27X BRD (SUTURE) ×2 IMPLANT
SUT VICRYL 4-0 PS2 18IN ABS (SUTURE) ×6 IMPLANT
TOWEL GREEN STERILE (TOWEL DISPOSABLE) ×3 IMPLANT
UNDERPAD 30X36 HEAVY ABSORB (UNDERPADS AND DIAPERS) ×3 IMPLANT
WATER STERILE IRR 1000ML POUR (IV SOLUTION) ×3 IMPLANT

## 2020-07-03 NOTE — Op Note (Signed)
    NAME: [name]    MRN: [id number] DOB: 03-21-1941    DATE OF OPERATION: 07/03/2020  PREOP DIAGNOSIS:    End-stage renal disease  POSTOP DIAGNOSIS:    Same  PROCEDURE:    Right brachiocephalic AV fistula  SURGEON: Judeth Cornfield. Scot Dock, MD  ASSIST: Arlee Muslim, PA  ANESTHESIA: Axillary block  EBL: Minimal  INDICATIONS:    [name] is a 79 y.o. female who has a functioning left upper arm access but mild steal symptoms.  We elected to place new access in the right arm so that ultimately the access on the left could be ligated.  FINDINGS:   The upper arm cephalic vein looked reasonable in size and therefore elected to place a brachiocephalic fistula  TECHNIQUE:   The patient was taken to the operating room and had received an axillary block by anesthesia.  The right arm was prepped and draped in usual sterile fashion.  I looked at the veins myself with the SonoSite and I felt the upper arm cephalic vein although somewhat small was reasonable in size.  A transverse incision was made just above the antecubital level.  Here the cephalic vein was dissected free and ligated distally.  Irrigated up with heparinized saline was a 3.5 mm vein.  The brachial artery was dissected free beneath the fascia.  It was somewhat under the biceps tendon.  The patient was heparinized.  The brachial artery was clamped proximally and distally and a longitudinal arteriotomy was made.  The vein was sewn end-to-side to the artery using continuous 6-0 Prolene suture.  At the completion was an excellent thrill in the fistula.  There was a radial and ulnar signal with a Doppler.  The heparin was partially reversed with protamine.  The wound was closed with a deep layer of 3-0 Vicryl and the skin closed with 4-0 Vicryl.  Dermabond was applied.  The patient tolerated the procedure well and was transferred to recovery room in stable condition.  All needle and sponge counts were correct.  Given  the complexity of the case a first assistant was necessary in order to expedient the procedure and safely perform the technical aspects of the operation.  [name], MD, FACS [facility]  DATE OF DICTATION:   07/03/2020

## 2020-07-03 NOTE — H&P (Signed)
REASON FOR CONSULT:    For new hemodialysis access  ASSESSMENT & PLAN:   END-STAGE RENAL DISEASE: The patient has a functioning left upper arm access but is having some mild steal symptoms.  After extensive discussion with the patient and her daughter in the office we decided to proceed with access in the right arm so that this could potentially be ready for use and then we could ligate the access in the left arm.  Based on her vein map she would require either a for stage basilic vein transposition versus an AV graft in the right arm.  She dialyzes on Tuesdays Thursdays and Saturdays.  She presents for the above mentioned procedure.   [name], MD Office: [phone]   HPI:   [name] is a pleasant 79 y.o. female, who currently has a left upper arm access but has been having some steal symptoms on the left.  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  This is based on a fistulogram that was done in September.  The stenosis did not appear to be significant as the fistula has an excellent thrill and is not pulsatile.  There was no evidence of occlusive disease in the brachial artery.   She has not had access in the right arm.  Past Medical History:  Diagnosis Date  . Anemia   . Anxiety   . Arthritis   . Atrial fibrillation ([place])   . Blood transfusion   . C. difficile diarrhea   . CAD (coronary artery disease)   . Carotid artery occlusion    Carotid Endartectom,y - left 2009.  Blockage Right being watched by Dr Scot Dock.  . Carotid stenosis   . 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 Complication of anesthesia    pt. states that she was difficult to wake  . Depression   . Diabetes mellitus without complication ([place])   . Diverticulosis   . Dysrhythmia   . General weakness 12/2015  . GERD (gastroesophageal reflux disease)   . Glaucoma   . History of hiatal hernia   . History of kidney stones    passed  . History of pneumonia    . HOH (hard of hearing)   . Hypertension   . Hypokalemia 12/2015  . Melanoma ([place])    .  top of head- melonoma  . Myocardial infarction ([place])   . 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 Pneumonia   . SAH (subarachnoid hemorrhage) ([employer]) 01/25/2017   bifrontal SAH following fall ([place])  . Shortness of breath   . 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 Stroke ([place])    hx of TIA    Family History  Problem Relation Age of Onset  . Diabetes Mother   . Hypertension Mother   . Heart disease Mother        before age 38  . Heart attack Mother   . Stroke Mother   . Hyperlipidemia Father   . Hypertension Father   . Colon polyps Father   . Lung cancer Father   . Deep vein thrombosis Daughter   . Diabetes Daughter   . Hyperlipidemia Daughter   . Hypertension Daughter   . Heart disease Daughter   . Peripheral vascular disease Daughter   . Breast cancer Neg Hx     SOCIAL HISTORY: Social History   Socioeconomic History  . Marital status: Married  Spouse name: Not on file  . Number of children: 2  . Years of education: Not on file  . Highest education level: Not on file  Occupational History  . Occupation: retired  Tobacco Use  . Smoking status: Never Smoker  . Smokeless tobacco: Never Used  Vaping Use  . Vaping Use: Never used  Substance and Sexual Activity  . Alcohol use: No    Alcohol/week: 0.0 standard drinks  . Drug use: No  . Sexual activity: Not Currently    Birth control/protection: Post-menopausal  Other Topics Concern  . Not on file  Social History Narrative  . Not on file   Social Determinants of Health   Financial Resource Strain: Not on file  Food Insecurity: Not on file  Transportation Needs: Not on file  Physical Activity: Not on file  Stress: Not on file  Social Connections: Not on file  Intimate Partner Violence: Not on file    Allergies  Allergen Reactions  . Neurontin [Gabapentin] Other (See Comments)     Hallucinations and "Makes me go crazy"   . Sulfa Antibiotics Other (See Comments)    Altered mental state   . Norvasc [Amlodipine] Other (See Comments)    "Makes my legs swell"; edema    Current Facility-Administered Medications  Medication Dose Route Frequency Provider Last Rate Last Admin  . 0.9 %  sodium chloride infusion   Intravenous Continuous Angelia Mould, MD      . ceFAZolin (ANCEF) IVPB 2g/100 mL premix  2 g Intravenous 30 min Pre-Op Angelia Mould, MD      . chlorhexidine (HIBICLENS) 4 % liquid 4 application  60 mL Topical Once Angelia Mould, MD       And  . chlorhexidine (HIBICLENS) 4 % liquid 4 application  60 mL Topical Once Angelia Mould, MD      . fentaNYL (SUBLIMAZE) 100 MCG/2ML injection           . midazolam (VERSED) 2 MG/2ML injection             REVIEW OF SYSTEMS:  [X]  denotes positive finding, [ ]  denotes negative finding Cardiac  Comments:  Chest pain or chest pressure:    Shortness of breath upon exertion:    Short of breath when lying flat:    Irregular heart rhythm:        Vascular    Pain in calf, thigh, or hip brought on by ambulation:    Pain in feet at night that wakes you up from your sleep:     Blood clot in your veins:    Leg swelling:         Pulmonary    Oxygen at home:    Productive cough:     Wheezing:         Neurologic    Sudden weakness in arms or legs:     Sudden numbness in arms or legs:     Sudden onset of difficulty speaking or slurred speech:    Temporary loss of vision in one eye:     Problems with dizziness:         Gastrointestinal    Blood in stool:     Vomited blood:         Genitourinary    Burning when urinating:     Blood in urine:        Psychiatric    Major depression:         Hematologic  Bleeding problems:    Problems with blood clotting too easily:        Skin    Rashes or ulcers:        Constitutional    Fever or chills:     PHYSICAL EXAM:   Vitals:    07/03/20 1115  BP: (!) 182/53  Pulse: 71  Resp: 16  Temp: 98.1 F (36.7 C)  TempSrc: Oral  SpO2: 100%  Weight: 74.8 kg  Height: 5\' 3"  (1.6 m)    GENERAL: The patient is a well-nourished female, in no acute distress. The vital signs are documented above. CARDIAC: There is a regular rate and rhythm.  VASCULAR: She has a good thrill in her upper arm graft on the left.  She has a palpable right radial pulse. PULMONARY: There is good air exchange bilaterally without wheezing or rales. ABDOMEN: Soft and non-tender with normal pitched bowel sounds.  MUSCULOSKELETAL: There are no major deformities or cyanosis. NEUROLOGIC: No focal weakness or paresthesias are detected. SKIN: There are no ulcers or rashes noted. PSYCHIATRIC: The patient has a normal affect.  DATA:    Potassium is 4.9.

## 2020-07-03 NOTE — Progress Notes (Signed)
Orthopedic Tech Progress Note Patient Details:  [name] May 21, 1941 [id number] PACU RN called requesting a ARM SLING Ortho Devices Type of Ortho Device: Arm sling Ortho Device/Splint Location: RUE Ortho Device/Splint Interventions: Ordered,Application,Adjustment   Post Interventions Patient Tolerated: Well Instructions Provided: Care of device   Janit Pagan 07/03/2020, 4:31 PM

## 2020-07-03 NOTE — Anesthesia Procedure Notes (Signed)
Anesthesia Regional Block: Supraclavicular block   Pre-Anesthetic Checklist: ,, timeout performed, Correct Patient, Correct Site, Correct Laterality, Correct Procedure, Correct Position, site marked, Risks and benefits discussed, pre-op evaluation,  At surgeon's request and post-op pain management  Laterality: Right  Prep: Maximum Sterile Barrier Precautions used, chloraprep       Needles:  Injection technique: Single-shot  Needle Type: Echogenic Stimulator Needle     Needle Length: 5cm  Needle Gauge: 22     Additional Needles:   Procedures:,,,, ultrasound used (permanent image in chart),,,,  Narrative:  Start time: 07/03/2020 2:03 PM End time: 07/03/2020 2:13 PM Injection made incrementally with aspirations every 5 mL. Anesthesiologist: Roderic Palau, MD  Additional Notes: 2% Lidocaine skin wheel.

## 2020-07-03 NOTE — Transfer of Care (Signed)
Immediate Anesthesia Transfer of Care Note  Patient: [name]  Procedure(s) Performed: [place] (Right Arm Upper)  Patient Location: PACU  Anesthesia Type:MAC and Regional  Level of Consciousness: drowsy and patient cooperative  Airway & Oxygen Therapy: Patient Spontanous Breathing and Patient connected to face mask oxygen  Post-op Assessment: Report given to RN and Post -op Vital signs reviewed and stable  Post vital signs: Reviewed and stable  Last Vitals:  Vitals Value Taken Time  BP 123/51 07/03/20 1604  Temp    Pulse 82 07/03/20 1604  Resp 19 07/03/20 1604  SpO2 100 % 07/03/20 1604  Vitals shown include unvalidated device data.  Last Pain:  Vitals:   07/03/20 1115  TempSrc: Oral  PainSc: 0-No pain         Complications: No complications documented.

## 2020-07-03 NOTE — Anesthesia Postprocedure Evaluation (Signed)
Anesthesia Post Note  Patient: [name]  Procedure(s) Performed: [place] (Right Arm Upper)     Patient location during evaluation: PACU Anesthesia Type: Regional and MAC Level of consciousness: awake and alert and oriented Pain management: pain level controlled Vital Signs Assessment: post-procedure vital signs reviewed and stable Respiratory status: spontaneous breathing, nonlabored ventilation and respiratory function stable Cardiovascular status: stable and blood pressure returned to baseline Postop Assessment: no apparent nausea or vomiting Anesthetic complications: no   No complications documented.  Last Vitals:  Vitals:   07/03/20 1415 07/03/20 1605  BP: (!) 179/138   Pulse: 82   Resp: 15   Temp:  (!) 36.1 C  SpO2: 100%     Last Pain:  Vitals:   07/03/20 1605  TempSrc:   PainSc: 0-No pain                 Lisbet Busker A.

## 2020-07-04 ENCOUNTER — Encounter (HOSPITAL_COMMUNITY): Payer: Self-pay | Admitting: Vascular Surgery

## 2020-07-04 DIAGNOSIS — E876 Hypokalemia: Secondary | ICD-10-CM | POA: Diagnosis not present

## 2020-07-04 DIAGNOSIS — N2581 Secondary hyperparathyroidism of renal origin: Secondary | ICD-10-CM | POA: Diagnosis not present

## 2020-07-04 DIAGNOSIS — D631 Anemia in chronic kidney disease: Secondary | ICD-10-CM | POA: Diagnosis not present

## 2020-07-04 DIAGNOSIS — N186 End stage renal disease: Secondary | ICD-10-CM | POA: Diagnosis not present

## 2020-07-04 DIAGNOSIS — Z992 Dependence on renal dialysis: Secondary | ICD-10-CM | POA: Diagnosis not present

## 2020-07-06 DIAGNOSIS — N186 End stage renal disease: Secondary | ICD-10-CM | POA: Diagnosis not present

## 2020-07-06 DIAGNOSIS — Z992 Dependence on renal dialysis: Secondary | ICD-10-CM | POA: Diagnosis not present

## 2020-07-06 DIAGNOSIS — T82868A Thrombosis of vascular prosthetic devices, implants and grafts, initial encounter: Secondary | ICD-10-CM | POA: Diagnosis not present

## 2020-07-06 DIAGNOSIS — T82858A Stenosis of vascular prosthetic devices, implants and grafts, initial encounter: Secondary | ICD-10-CM | POA: Diagnosis not present

## 2020-07-07 DIAGNOSIS — N186 End stage renal disease: Secondary | ICD-10-CM | POA: Diagnosis not present

## 2020-07-07 DIAGNOSIS — N2581 Secondary hyperparathyroidism of renal origin: Secondary | ICD-10-CM | POA: Diagnosis not present

## 2020-07-07 DIAGNOSIS — D631 Anemia in chronic kidney disease: Secondary | ICD-10-CM | POA: Diagnosis not present

## 2020-07-07 DIAGNOSIS — E876 Hypokalemia: Secondary | ICD-10-CM | POA: Diagnosis not present

## 2020-07-07 DIAGNOSIS — Z992 Dependence on renal dialysis: Secondary | ICD-10-CM | POA: Diagnosis not present

## 2020-07-09 DIAGNOSIS — E876 Hypokalemia: Secondary | ICD-10-CM | POA: Diagnosis not present

## 2020-07-09 DIAGNOSIS — Z992 Dependence on renal dialysis: Secondary | ICD-10-CM | POA: Diagnosis not present

## 2020-07-09 DIAGNOSIS — N186 End stage renal disease: Secondary | ICD-10-CM | POA: Diagnosis not present

## 2020-07-09 DIAGNOSIS — N2581 Secondary hyperparathyroidism of renal origin: Secondary | ICD-10-CM | POA: Diagnosis not present

## 2020-07-09 DIAGNOSIS — D631 Anemia in chronic kidney disease: Secondary | ICD-10-CM | POA: Diagnosis not present

## 2020-07-11 DIAGNOSIS — D631 Anemia in chronic kidney disease: Secondary | ICD-10-CM | POA: Diagnosis not present

## 2020-07-11 DIAGNOSIS — E876 Hypokalemia: Secondary | ICD-10-CM | POA: Diagnosis not present

## 2020-07-11 DIAGNOSIS — Z992 Dependence on renal dialysis: Secondary | ICD-10-CM | POA: Diagnosis not present

## 2020-07-11 DIAGNOSIS — N186 End stage renal disease: Secondary | ICD-10-CM | POA: Diagnosis not present

## 2020-07-11 DIAGNOSIS — N2581 Secondary hyperparathyroidism of renal origin: Secondary | ICD-10-CM | POA: Diagnosis not present

## 2020-07-13 DIAGNOSIS — N2581 Secondary hyperparathyroidism of renal origin: Secondary | ICD-10-CM | POA: Diagnosis not present

## 2020-07-13 DIAGNOSIS — Z992 Dependence on renal dialysis: Secondary | ICD-10-CM | POA: Diagnosis not present

## 2020-07-13 DIAGNOSIS — D631 Anemia in chronic kidney disease: Secondary | ICD-10-CM | POA: Diagnosis not present

## 2020-07-13 DIAGNOSIS — E876 Hypokalemia: Secondary | ICD-10-CM | POA: Diagnosis not present

## 2020-07-13 DIAGNOSIS — N186 End stage renal disease: Secondary | ICD-10-CM | POA: Diagnosis not present

## 2020-07-15 DIAGNOSIS — N186 End stage renal disease: Secondary | ICD-10-CM | POA: Diagnosis not present

## 2020-07-15 DIAGNOSIS — E1129 Type 2 diabetes mellitus with other diabetic kidney complication: Secondary | ICD-10-CM | POA: Diagnosis not present

## 2020-07-15 DIAGNOSIS — Z992 Dependence on renal dialysis: Secondary | ICD-10-CM | POA: Diagnosis not present

## 2020-07-16 DIAGNOSIS — D509 Iron deficiency anemia, unspecified: Secondary | ICD-10-CM | POA: Diagnosis not present

## 2020-07-16 DIAGNOSIS — N2581 Secondary hyperparathyroidism of renal origin: Secondary | ICD-10-CM | POA: Diagnosis not present

## 2020-07-16 DIAGNOSIS — Z992 Dependence on renal dialysis: Secondary | ICD-10-CM | POA: Diagnosis not present

## 2020-07-16 DIAGNOSIS — D631 Anemia in chronic kidney disease: Secondary | ICD-10-CM | POA: Diagnosis not present

## 2020-07-16 DIAGNOSIS — E876 Hypokalemia: Secondary | ICD-10-CM | POA: Diagnosis not present

## 2020-07-16 DIAGNOSIS — N186 End stage renal disease: Secondary | ICD-10-CM | POA: Diagnosis not present

## 2020-07-18 DIAGNOSIS — N186 End stage renal disease: Secondary | ICD-10-CM | POA: Diagnosis not present

## 2020-07-18 DIAGNOSIS — D509 Iron deficiency anemia, unspecified: Secondary | ICD-10-CM | POA: Diagnosis not present

## 2020-07-18 DIAGNOSIS — N2581 Secondary hyperparathyroidism of renal origin: Secondary | ICD-10-CM | POA: Diagnosis not present

## 2020-07-18 DIAGNOSIS — E876 Hypokalemia: Secondary | ICD-10-CM | POA: Diagnosis not present

## 2020-07-18 DIAGNOSIS — Z992 Dependence on renal dialysis: Secondary | ICD-10-CM | POA: Diagnosis not present

## 2020-07-18 DIAGNOSIS — D631 Anemia in chronic kidney disease: Secondary | ICD-10-CM | POA: Diagnosis not present

## 2020-07-20 DIAGNOSIS — E876 Hypokalemia: Secondary | ICD-10-CM | POA: Diagnosis not present

## 2020-07-20 DIAGNOSIS — N2581 Secondary hyperparathyroidism of renal origin: Secondary | ICD-10-CM | POA: Diagnosis not present

## 2020-07-20 DIAGNOSIS — D631 Anemia in chronic kidney disease: Secondary | ICD-10-CM | POA: Diagnosis not present

## 2020-07-20 DIAGNOSIS — D509 Iron deficiency anemia, unspecified: Secondary | ICD-10-CM | POA: Diagnosis not present

## 2020-07-20 DIAGNOSIS — Z992 Dependence on renal dialysis: Secondary | ICD-10-CM | POA: Diagnosis not present

## 2020-07-20 DIAGNOSIS — N186 End stage renal disease: Secondary | ICD-10-CM | POA: Diagnosis not present

## 2020-07-22 DIAGNOSIS — N186 End stage renal disease: Secondary | ICD-10-CM | POA: Diagnosis not present

## 2020-07-22 DIAGNOSIS — N2581 Secondary hyperparathyroidism of renal origin: Secondary | ICD-10-CM | POA: Diagnosis not present

## 2020-07-22 DIAGNOSIS — D631 Anemia in chronic kidney disease: Secondary | ICD-10-CM | POA: Diagnosis not present

## 2020-07-22 DIAGNOSIS — D509 Iron deficiency anemia, unspecified: Secondary | ICD-10-CM | POA: Diagnosis not present

## 2020-07-22 DIAGNOSIS — E876 Hypokalemia: Secondary | ICD-10-CM | POA: Diagnosis not present

## 2020-07-22 DIAGNOSIS — Z992 Dependence on renal dialysis: Secondary | ICD-10-CM | POA: Diagnosis not present

## 2020-07-25 DIAGNOSIS — N186 End stage renal disease: Secondary | ICD-10-CM | POA: Diagnosis not present

## 2020-07-25 DIAGNOSIS — N2581 Secondary hyperparathyroidism of renal origin: Secondary | ICD-10-CM | POA: Diagnosis not present

## 2020-07-25 DIAGNOSIS — Z992 Dependence on renal dialysis: Secondary | ICD-10-CM | POA: Diagnosis not present

## 2020-07-25 DIAGNOSIS — E876 Hypokalemia: Secondary | ICD-10-CM | POA: Diagnosis not present

## 2020-07-25 DIAGNOSIS — D509 Iron deficiency anemia, unspecified: Secondary | ICD-10-CM | POA: Diagnosis not present

## 2020-07-25 DIAGNOSIS — D631 Anemia in chronic kidney disease: Secondary | ICD-10-CM | POA: Diagnosis not present

## 2020-07-27 DIAGNOSIS — Z992 Dependence on renal dialysis: Secondary | ICD-10-CM | POA: Diagnosis not present

## 2020-07-27 DIAGNOSIS — D631 Anemia in chronic kidney disease: Secondary | ICD-10-CM | POA: Diagnosis not present

## 2020-07-27 DIAGNOSIS — N186 End stage renal disease: Secondary | ICD-10-CM | POA: Diagnosis not present

## 2020-07-27 DIAGNOSIS — E0869 Diabetes mellitus due to underlying condition with other specified complication: Secondary | ICD-10-CM | POA: Diagnosis not present

## 2020-07-27 DIAGNOSIS — N2581 Secondary hyperparathyroidism of renal origin: Secondary | ICD-10-CM | POA: Diagnosis not present

## 2020-07-27 DIAGNOSIS — E876 Hypokalemia: Secondary | ICD-10-CM | POA: Diagnosis not present

## 2020-07-27 DIAGNOSIS — D509 Iron deficiency anemia, unspecified: Secondary | ICD-10-CM | POA: Diagnosis not present

## 2020-07-29 DIAGNOSIS — E876 Hypokalemia: Secondary | ICD-10-CM | POA: Diagnosis not present

## 2020-07-29 DIAGNOSIS — D631 Anemia in chronic kidney disease: Secondary | ICD-10-CM | POA: Diagnosis not present

## 2020-07-29 DIAGNOSIS — D509 Iron deficiency anemia, unspecified: Secondary | ICD-10-CM | POA: Diagnosis not present

## 2020-07-29 DIAGNOSIS — N186 End stage renal disease: Secondary | ICD-10-CM | POA: Diagnosis not present

## 2020-07-29 DIAGNOSIS — Z992 Dependence on renal dialysis: Secondary | ICD-10-CM | POA: Diagnosis not present

## 2020-07-29 DIAGNOSIS — N2581 Secondary hyperparathyroidism of renal origin: Secondary | ICD-10-CM | POA: Diagnosis not present

## 2020-08-01 ENCOUNTER — Other Ambulatory Visit: Payer: Self-pay

## 2020-08-01 DIAGNOSIS — E876 Hypokalemia: Secondary | ICD-10-CM | POA: Diagnosis not present

## 2020-08-01 DIAGNOSIS — N2581 Secondary hyperparathyroidism of renal origin: Secondary | ICD-10-CM | POA: Diagnosis not present

## 2020-08-01 DIAGNOSIS — E1121 Type 2 diabetes mellitus with diabetic nephropathy: Secondary | ICD-10-CM | POA: Insufficient documentation

## 2020-08-01 DIAGNOSIS — M2550 Pain in unspecified joint: Secondary | ICD-10-CM | POA: Insufficient documentation

## 2020-08-01 DIAGNOSIS — M109 Gout, unspecified: Secondary | ICD-10-CM | POA: Insufficient documentation

## 2020-08-01 DIAGNOSIS — N186 End stage renal disease: Secondary | ICD-10-CM

## 2020-08-01 DIAGNOSIS — M5090 Cervical disc disorder, unspecified, unspecified cervical region: Secondary | ICD-10-CM | POA: Insufficient documentation

## 2020-08-01 DIAGNOSIS — R809 Proteinuria, unspecified: Secondary | ICD-10-CM | POA: Insufficient documentation

## 2020-08-01 DIAGNOSIS — M179 Osteoarthritis of knee, unspecified: Secondary | ICD-10-CM | POA: Insufficient documentation

## 2020-08-01 DIAGNOSIS — Z992 Dependence on renal dialysis: Secondary | ICD-10-CM | POA: Insufficient documentation

## 2020-08-01 DIAGNOSIS — Z0000 Encounter for general adult medical examination without abnormal findings: Secondary | ICD-10-CM | POA: Insufficient documentation

## 2020-08-01 DIAGNOSIS — D509 Iron deficiency anemia, unspecified: Secondary | ICD-10-CM | POA: Diagnosis not present

## 2020-08-01 DIAGNOSIS — R609 Edema, unspecified: Secondary | ICD-10-CM | POA: Insufficient documentation

## 2020-08-01 DIAGNOSIS — D631 Anemia in chronic kidney disease: Secondary | ICD-10-CM | POA: Diagnosis not present

## 2020-08-01 DIAGNOSIS — E11319 Type 2 diabetes mellitus with unspecified diabetic retinopathy without macular edema: Secondary | ICD-10-CM | POA: Insufficient documentation

## 2020-08-01 DIAGNOSIS — M158 Other polyosteoarthritis: Secondary | ICD-10-CM | POA: Insufficient documentation

## 2020-08-01 DIAGNOSIS — D81819 Biotin-dependent carboxylase deficiency, unspecified: Secondary | ICD-10-CM | POA: Insufficient documentation

## 2020-08-01 DIAGNOSIS — Z79899 Other long term (current) drug therapy: Secondary | ICD-10-CM | POA: Insufficient documentation

## 2020-08-01 DIAGNOSIS — E1165 Type 2 diabetes mellitus with hyperglycemia: Secondary | ICD-10-CM | POA: Insufficient documentation

## 2020-08-01 DIAGNOSIS — G609 Hereditary and idiopathic neuropathy, unspecified: Secondary | ICD-10-CM | POA: Insufficient documentation

## 2020-08-01 DIAGNOSIS — M1710 Unilateral primary osteoarthritis, unspecified knee: Secondary | ICD-10-CM | POA: Insufficient documentation

## 2020-08-01 DIAGNOSIS — E781 Pure hyperglyceridemia: Secondary | ICD-10-CM | POA: Insufficient documentation

## 2020-08-01 DIAGNOSIS — E7800 Pure hypercholesterolemia, unspecified: Secondary | ICD-10-CM | POA: Insufficient documentation

## 2020-08-01 DIAGNOSIS — R1110 Vomiting, unspecified: Secondary | ICD-10-CM | POA: Insufficient documentation

## 2020-08-01 DIAGNOSIS — N390 Urinary tract infection, site not specified: Secondary | ICD-10-CM | POA: Insufficient documentation

## 2020-08-01 DIAGNOSIS — J45991 Cough variant asthma: Secondary | ICD-10-CM | POA: Insufficient documentation

## 2020-08-01 DIAGNOSIS — E559 Vitamin D deficiency, unspecified: Secondary | ICD-10-CM | POA: Insufficient documentation

## 2020-08-01 DIAGNOSIS — Z6832 Body mass index (BMI) 32.0-32.9, adult: Secondary | ICD-10-CM | POA: Insufficient documentation

## 2020-08-01 DIAGNOSIS — M25569 Pain in unspecified knee: Secondary | ICD-10-CM | POA: Insufficient documentation

## 2020-08-03 DIAGNOSIS — N186 End stage renal disease: Secondary | ICD-10-CM | POA: Diagnosis not present

## 2020-08-03 DIAGNOSIS — N2581 Secondary hyperparathyroidism of renal origin: Secondary | ICD-10-CM | POA: Diagnosis not present

## 2020-08-03 DIAGNOSIS — D631 Anemia in chronic kidney disease: Secondary | ICD-10-CM | POA: Diagnosis not present

## 2020-08-03 DIAGNOSIS — E876 Hypokalemia: Secondary | ICD-10-CM | POA: Diagnosis not present

## 2020-08-03 DIAGNOSIS — D509 Iron deficiency anemia, unspecified: Secondary | ICD-10-CM | POA: Diagnosis not present

## 2020-08-03 DIAGNOSIS — Z992 Dependence on renal dialysis: Secondary | ICD-10-CM | POA: Diagnosis not present

## 2020-08-05 DIAGNOSIS — Z992 Dependence on renal dialysis: Secondary | ICD-10-CM | POA: Diagnosis not present

## 2020-08-05 DIAGNOSIS — D631 Anemia in chronic kidney disease: Secondary | ICD-10-CM | POA: Diagnosis not present

## 2020-08-05 DIAGNOSIS — N186 End stage renal disease: Secondary | ICD-10-CM | POA: Diagnosis not present

## 2020-08-05 DIAGNOSIS — N2581 Secondary hyperparathyroidism of renal origin: Secondary | ICD-10-CM | POA: Diagnosis not present

## 2020-08-05 DIAGNOSIS — D509 Iron deficiency anemia, unspecified: Secondary | ICD-10-CM | POA: Diagnosis not present

## 2020-08-05 DIAGNOSIS — E876 Hypokalemia: Secondary | ICD-10-CM | POA: Diagnosis not present

## 2020-08-08 DIAGNOSIS — N186 End stage renal disease: Secondary | ICD-10-CM | POA: Diagnosis not present

## 2020-08-08 DIAGNOSIS — D631 Anemia in chronic kidney disease: Secondary | ICD-10-CM | POA: Diagnosis not present

## 2020-08-08 DIAGNOSIS — Z992 Dependence on renal dialysis: Secondary | ICD-10-CM | POA: Diagnosis not present

## 2020-08-08 DIAGNOSIS — E876 Hypokalemia: Secondary | ICD-10-CM | POA: Diagnosis not present

## 2020-08-08 DIAGNOSIS — D509 Iron deficiency anemia, unspecified: Secondary | ICD-10-CM | POA: Diagnosis not present

## 2020-08-08 DIAGNOSIS — N2581 Secondary hyperparathyroidism of renal origin: Secondary | ICD-10-CM | POA: Diagnosis not present

## 2020-08-10 DIAGNOSIS — E876 Hypokalemia: Secondary | ICD-10-CM | POA: Diagnosis not present

## 2020-08-10 DIAGNOSIS — N186 End stage renal disease: Secondary | ICD-10-CM | POA: Diagnosis not present

## 2020-08-10 DIAGNOSIS — Z992 Dependence on renal dialysis: Secondary | ICD-10-CM | POA: Diagnosis not present

## 2020-08-10 DIAGNOSIS — D509 Iron deficiency anemia, unspecified: Secondary | ICD-10-CM | POA: Diagnosis not present

## 2020-08-10 DIAGNOSIS — D631 Anemia in chronic kidney disease: Secondary | ICD-10-CM | POA: Diagnosis not present

## 2020-08-10 DIAGNOSIS — N2581 Secondary hyperparathyroidism of renal origin: Secondary | ICD-10-CM | POA: Diagnosis not present

## 2020-08-12 DIAGNOSIS — E876 Hypokalemia: Secondary | ICD-10-CM | POA: Diagnosis not present

## 2020-08-12 DIAGNOSIS — Z992 Dependence on renal dialysis: Secondary | ICD-10-CM | POA: Diagnosis not present

## 2020-08-12 DIAGNOSIS — D509 Iron deficiency anemia, unspecified: Secondary | ICD-10-CM | POA: Diagnosis not present

## 2020-08-12 DIAGNOSIS — N186 End stage renal disease: Secondary | ICD-10-CM | POA: Diagnosis not present

## 2020-08-12 DIAGNOSIS — D631 Anemia in chronic kidney disease: Secondary | ICD-10-CM | POA: Diagnosis not present

## 2020-08-12 DIAGNOSIS — N2581 Secondary hyperparathyroidism of renal origin: Secondary | ICD-10-CM | POA: Diagnosis not present

## 2020-08-15 DIAGNOSIS — Z23 Encounter for immunization: Secondary | ICD-10-CM | POA: Diagnosis not present

## 2020-08-15 DIAGNOSIS — D631 Anemia in chronic kidney disease: Secondary | ICD-10-CM | POA: Diagnosis not present

## 2020-08-15 DIAGNOSIS — D509 Iron deficiency anemia, unspecified: Secondary | ICD-10-CM | POA: Diagnosis not present

## 2020-08-15 DIAGNOSIS — Z992 Dependence on renal dialysis: Secondary | ICD-10-CM | POA: Diagnosis not present

## 2020-08-15 DIAGNOSIS — E876 Hypokalemia: Secondary | ICD-10-CM | POA: Diagnosis not present

## 2020-08-15 DIAGNOSIS — N186 End stage renal disease: Secondary | ICD-10-CM | POA: Diagnosis not present

## 2020-08-15 DIAGNOSIS — E1129 Type 2 diabetes mellitus with other diabetic kidney complication: Secondary | ICD-10-CM | POA: Diagnosis not present

## 2020-08-15 DIAGNOSIS — N2581 Secondary hyperparathyroidism of renal origin: Secondary | ICD-10-CM | POA: Diagnosis not present

## 2020-08-16 ENCOUNTER — Encounter (HOSPITAL_COMMUNITY): Payer: Medicare Other

## 2020-08-17 DIAGNOSIS — Z992 Dependence on renal dialysis: Secondary | ICD-10-CM | POA: Diagnosis not present

## 2020-08-17 DIAGNOSIS — D509 Iron deficiency anemia, unspecified: Secondary | ICD-10-CM | POA: Diagnosis not present

## 2020-08-17 DIAGNOSIS — D631 Anemia in chronic kidney disease: Secondary | ICD-10-CM | POA: Diagnosis not present

## 2020-08-17 DIAGNOSIS — N186 End stage renal disease: Secondary | ICD-10-CM | POA: Diagnosis not present

## 2020-08-17 DIAGNOSIS — E876 Hypokalemia: Secondary | ICD-10-CM | POA: Diagnosis not present

## 2020-08-17 DIAGNOSIS — Z23 Encounter for immunization: Secondary | ICD-10-CM | POA: Diagnosis not present

## 2020-08-18 ENCOUNTER — Encounter (HOSPITAL_COMMUNITY): Payer: Medicare Other

## 2020-08-19 DIAGNOSIS — D631 Anemia in chronic kidney disease: Secondary | ICD-10-CM | POA: Diagnosis not present

## 2020-08-19 DIAGNOSIS — Z23 Encounter for immunization: Secondary | ICD-10-CM | POA: Diagnosis not present

## 2020-08-19 DIAGNOSIS — N186 End stage renal disease: Secondary | ICD-10-CM | POA: Diagnosis not present

## 2020-08-19 DIAGNOSIS — Z992 Dependence on renal dialysis: Secondary | ICD-10-CM | POA: Diagnosis not present

## 2020-08-19 DIAGNOSIS — D509 Iron deficiency anemia, unspecified: Secondary | ICD-10-CM | POA: Diagnosis not present

## 2020-08-19 DIAGNOSIS — E876 Hypokalemia: Secondary | ICD-10-CM | POA: Diagnosis not present

## 2020-08-22 DIAGNOSIS — Z23 Encounter for immunization: Secondary | ICD-10-CM | POA: Diagnosis not present

## 2020-08-22 DIAGNOSIS — Z992 Dependence on renal dialysis: Secondary | ICD-10-CM | POA: Diagnosis not present

## 2020-08-22 DIAGNOSIS — D509 Iron deficiency anemia, unspecified: Secondary | ICD-10-CM | POA: Diagnosis not present

## 2020-08-22 DIAGNOSIS — D631 Anemia in chronic kidney disease: Secondary | ICD-10-CM | POA: Diagnosis not present

## 2020-08-22 DIAGNOSIS — E876 Hypokalemia: Secondary | ICD-10-CM | POA: Diagnosis not present

## 2020-08-22 DIAGNOSIS — N186 End stage renal disease: Secondary | ICD-10-CM | POA: Diagnosis not present

## 2020-08-24 DIAGNOSIS — E876 Hypokalemia: Secondary | ICD-10-CM | POA: Diagnosis not present

## 2020-08-24 DIAGNOSIS — D509 Iron deficiency anemia, unspecified: Secondary | ICD-10-CM | POA: Diagnosis not present

## 2020-08-24 DIAGNOSIS — Z992 Dependence on renal dialysis: Secondary | ICD-10-CM | POA: Diagnosis not present

## 2020-08-24 DIAGNOSIS — D631 Anemia in chronic kidney disease: Secondary | ICD-10-CM | POA: Diagnosis not present

## 2020-08-24 DIAGNOSIS — N186 End stage renal disease: Secondary | ICD-10-CM | POA: Diagnosis not present

## 2020-08-24 DIAGNOSIS — Z23 Encounter for immunization: Secondary | ICD-10-CM | POA: Diagnosis not present

## 2020-08-26 DIAGNOSIS — D631 Anemia in chronic kidney disease: Secondary | ICD-10-CM | POA: Diagnosis not present

## 2020-08-26 DIAGNOSIS — E876 Hypokalemia: Secondary | ICD-10-CM | POA: Diagnosis not present

## 2020-08-26 DIAGNOSIS — D509 Iron deficiency anemia, unspecified: Secondary | ICD-10-CM | POA: Diagnosis not present

## 2020-08-26 DIAGNOSIS — Z23 Encounter for immunization: Secondary | ICD-10-CM | POA: Diagnosis not present

## 2020-08-26 DIAGNOSIS — Z992 Dependence on renal dialysis: Secondary | ICD-10-CM | POA: Diagnosis not present

## 2020-08-26 DIAGNOSIS — N186 End stage renal disease: Secondary | ICD-10-CM | POA: Diagnosis not present

## 2020-08-29 DIAGNOSIS — Z23 Encounter for immunization: Secondary | ICD-10-CM | POA: Diagnosis not present

## 2020-08-29 DIAGNOSIS — N186 End stage renal disease: Secondary | ICD-10-CM | POA: Diagnosis not present

## 2020-08-29 DIAGNOSIS — D509 Iron deficiency anemia, unspecified: Secondary | ICD-10-CM | POA: Diagnosis not present

## 2020-08-29 DIAGNOSIS — Z992 Dependence on renal dialysis: Secondary | ICD-10-CM | POA: Diagnosis not present

## 2020-08-29 DIAGNOSIS — E876 Hypokalemia: Secondary | ICD-10-CM | POA: Diagnosis not present

## 2020-08-29 DIAGNOSIS — D631 Anemia in chronic kidney disease: Secondary | ICD-10-CM | POA: Diagnosis not present

## 2020-08-31 DIAGNOSIS — N186 End stage renal disease: Secondary | ICD-10-CM | POA: Diagnosis not present

## 2020-08-31 DIAGNOSIS — Z992 Dependence on renal dialysis: Secondary | ICD-10-CM | POA: Diagnosis not present

## 2020-08-31 DIAGNOSIS — E876 Hypokalemia: Secondary | ICD-10-CM | POA: Diagnosis not present

## 2020-08-31 DIAGNOSIS — Z23 Encounter for immunization: Secondary | ICD-10-CM | POA: Diagnosis not present

## 2020-08-31 DIAGNOSIS — D509 Iron deficiency anemia, unspecified: Secondary | ICD-10-CM | POA: Diagnosis not present

## 2020-08-31 DIAGNOSIS — D631 Anemia in chronic kidney disease: Secondary | ICD-10-CM | POA: Diagnosis not present

## 2020-09-02 DIAGNOSIS — Z23 Encounter for immunization: Secondary | ICD-10-CM | POA: Diagnosis not present

## 2020-09-02 DIAGNOSIS — D631 Anemia in chronic kidney disease: Secondary | ICD-10-CM | POA: Diagnosis not present

## 2020-09-02 DIAGNOSIS — D509 Iron deficiency anemia, unspecified: Secondary | ICD-10-CM | POA: Diagnosis not present

## 2020-09-02 DIAGNOSIS — N186 End stage renal disease: Secondary | ICD-10-CM | POA: Diagnosis not present

## 2020-09-02 DIAGNOSIS — Z992 Dependence on renal dialysis: Secondary | ICD-10-CM | POA: Diagnosis not present

## 2020-09-02 DIAGNOSIS — E876 Hypokalemia: Secondary | ICD-10-CM | POA: Diagnosis not present

## 2020-09-04 DIAGNOSIS — I609 Nontraumatic subarachnoid hemorrhage, unspecified: Secondary | ICD-10-CM | POA: Diagnosis not present

## 2020-09-04 DIAGNOSIS — I2510 Atherosclerotic heart disease of native coronary artery without angina pectoris: Secondary | ICD-10-CM | POA: Diagnosis not present

## 2020-09-04 DIAGNOSIS — I25111 Atherosclerotic heart disease of native coronary artery with angina pectoris with documented spasm: Secondary | ICD-10-CM | POA: Diagnosis not present

## 2020-09-04 DIAGNOSIS — E782 Mixed hyperlipidemia: Secondary | ICD-10-CM | POA: Diagnosis not present

## 2020-09-04 DIAGNOSIS — N184 Chronic kidney disease, stage 4 (severe): Secondary | ICD-10-CM | POA: Diagnosis not present

## 2020-09-04 DIAGNOSIS — J45991 Cough variant asthma: Secondary | ICD-10-CM | POA: Diagnosis not present

## 2020-09-04 DIAGNOSIS — K219 Gastro-esophageal reflux disease without esophagitis: Secondary | ICD-10-CM | POA: Diagnosis not present

## 2020-09-04 DIAGNOSIS — E1121 Type 2 diabetes mellitus with diabetic nephropathy: Secondary | ICD-10-CM | POA: Diagnosis not present

## 2020-09-04 DIAGNOSIS — E1140 Type 2 diabetes mellitus with diabetic neuropathy, unspecified: Secondary | ICD-10-CM | POA: Diagnosis not present

## 2020-09-04 DIAGNOSIS — I10 Essential (primary) hypertension: Secondary | ICD-10-CM | POA: Diagnosis not present

## 2020-09-04 DIAGNOSIS — E781 Pure hyperglyceridemia: Secondary | ICD-10-CM | POA: Diagnosis not present

## 2020-09-05 DIAGNOSIS — D509 Iron deficiency anemia, unspecified: Secondary | ICD-10-CM | POA: Diagnosis not present

## 2020-09-05 DIAGNOSIS — D631 Anemia in chronic kidney disease: Secondary | ICD-10-CM | POA: Diagnosis not present

## 2020-09-05 DIAGNOSIS — E876 Hypokalemia: Secondary | ICD-10-CM | POA: Diagnosis not present

## 2020-09-05 DIAGNOSIS — Z992 Dependence on renal dialysis: Secondary | ICD-10-CM | POA: Diagnosis not present

## 2020-09-05 DIAGNOSIS — N186 End stage renal disease: Secondary | ICD-10-CM | POA: Diagnosis not present

## 2020-09-05 DIAGNOSIS — Z23 Encounter for immunization: Secondary | ICD-10-CM | POA: Diagnosis not present

## 2020-09-07 DIAGNOSIS — E876 Hypokalemia: Secondary | ICD-10-CM | POA: Diagnosis not present

## 2020-09-07 DIAGNOSIS — N186 End stage renal disease: Secondary | ICD-10-CM | POA: Diagnosis not present

## 2020-09-07 DIAGNOSIS — Z23 Encounter for immunization: Secondary | ICD-10-CM | POA: Diagnosis not present

## 2020-09-07 DIAGNOSIS — D631 Anemia in chronic kidney disease: Secondary | ICD-10-CM | POA: Diagnosis not present

## 2020-09-07 DIAGNOSIS — Z992 Dependence on renal dialysis: Secondary | ICD-10-CM | POA: Diagnosis not present

## 2020-09-07 DIAGNOSIS — D509 Iron deficiency anemia, unspecified: Secondary | ICD-10-CM | POA: Diagnosis not present

## 2020-09-09 DIAGNOSIS — Z23 Encounter for immunization: Secondary | ICD-10-CM | POA: Diagnosis not present

## 2020-09-09 DIAGNOSIS — E876 Hypokalemia: Secondary | ICD-10-CM | POA: Diagnosis not present

## 2020-09-09 DIAGNOSIS — D509 Iron deficiency anemia, unspecified: Secondary | ICD-10-CM | POA: Diagnosis not present

## 2020-09-09 DIAGNOSIS — Z992 Dependence on renal dialysis: Secondary | ICD-10-CM | POA: Diagnosis not present

## 2020-09-09 DIAGNOSIS — D631 Anemia in chronic kidney disease: Secondary | ICD-10-CM | POA: Diagnosis not present

## 2020-09-09 DIAGNOSIS — N186 End stage renal disease: Secondary | ICD-10-CM | POA: Diagnosis not present

## 2020-09-11 DIAGNOSIS — Z992 Dependence on renal dialysis: Secondary | ICD-10-CM | POA: Diagnosis not present

## 2020-09-11 DIAGNOSIS — I10 Essential (primary) hypertension: Secondary | ICD-10-CM | POA: Diagnosis not present

## 2020-09-11 DIAGNOSIS — K219 Gastro-esophageal reflux disease without esophagitis: Secondary | ICD-10-CM | POA: Diagnosis not present

## 2020-09-11 DIAGNOSIS — I25111 Atherosclerotic heart disease of native coronary artery with angina pectoris with documented spasm: Secondary | ICD-10-CM | POA: Diagnosis not present

## 2020-09-11 DIAGNOSIS — E781 Pure hyperglyceridemia: Secondary | ICD-10-CM | POA: Diagnosis not present

## 2020-09-11 DIAGNOSIS — E0840 Diabetes mellitus due to underlying condition with diabetic neuropathy, unspecified: Secondary | ICD-10-CM | POA: Diagnosis not present

## 2020-09-11 DIAGNOSIS — D631 Anemia in chronic kidney disease: Secondary | ICD-10-CM | POA: Diagnosis not present

## 2020-09-11 DIAGNOSIS — Z0001 Encounter for general adult medical examination with abnormal findings: Secondary | ICD-10-CM | POA: Diagnosis not present

## 2020-09-11 DIAGNOSIS — J45991 Cough variant asthma: Secondary | ICD-10-CM | POA: Diagnosis not present

## 2020-09-11 DIAGNOSIS — E559 Vitamin D deficiency, unspecified: Secondary | ICD-10-CM | POA: Diagnosis not present

## 2020-09-11 DIAGNOSIS — E1165 Type 2 diabetes mellitus with hyperglycemia: Secondary | ICD-10-CM | POA: Diagnosis not present

## 2020-09-12 DIAGNOSIS — E1129 Type 2 diabetes mellitus with other diabetic kidney complication: Secondary | ICD-10-CM | POA: Diagnosis not present

## 2020-09-12 DIAGNOSIS — D509 Iron deficiency anemia, unspecified: Secondary | ICD-10-CM | POA: Diagnosis not present

## 2020-09-12 DIAGNOSIS — N2581 Secondary hyperparathyroidism of renal origin: Secondary | ICD-10-CM | POA: Diagnosis not present

## 2020-09-12 DIAGNOSIS — Z992 Dependence on renal dialysis: Secondary | ICD-10-CM | POA: Diagnosis not present

## 2020-09-12 DIAGNOSIS — Z23 Encounter for immunization: Secondary | ICD-10-CM | POA: Diagnosis not present

## 2020-09-12 DIAGNOSIS — N186 End stage renal disease: Secondary | ICD-10-CM | POA: Diagnosis not present

## 2020-09-12 DIAGNOSIS — E876 Hypokalemia: Secondary | ICD-10-CM | POA: Diagnosis not present

## 2020-09-12 DIAGNOSIS — D631 Anemia in chronic kidney disease: Secondary | ICD-10-CM | POA: Diagnosis not present

## 2020-09-14 DIAGNOSIS — Z23 Encounter for immunization: Secondary | ICD-10-CM | POA: Diagnosis not present

## 2020-09-14 DIAGNOSIS — N186 End stage renal disease: Secondary | ICD-10-CM | POA: Diagnosis not present

## 2020-09-14 DIAGNOSIS — D509 Iron deficiency anemia, unspecified: Secondary | ICD-10-CM | POA: Diagnosis not present

## 2020-09-14 DIAGNOSIS — D631 Anemia in chronic kidney disease: Secondary | ICD-10-CM | POA: Diagnosis not present

## 2020-09-14 DIAGNOSIS — E876 Hypokalemia: Secondary | ICD-10-CM | POA: Diagnosis not present

## 2020-09-14 DIAGNOSIS — Z992 Dependence on renal dialysis: Secondary | ICD-10-CM | POA: Diagnosis not present

## 2020-09-15 ENCOUNTER — Other Ambulatory Visit: Payer: Self-pay

## 2020-09-15 DIAGNOSIS — Z992 Dependence on renal dialysis: Secondary | ICD-10-CM

## 2020-09-16 DIAGNOSIS — N186 End stage renal disease: Secondary | ICD-10-CM | POA: Diagnosis not present

## 2020-09-16 DIAGNOSIS — D631 Anemia in chronic kidney disease: Secondary | ICD-10-CM | POA: Diagnosis not present

## 2020-09-16 DIAGNOSIS — E876 Hypokalemia: Secondary | ICD-10-CM | POA: Diagnosis not present

## 2020-09-16 DIAGNOSIS — Z992 Dependence on renal dialysis: Secondary | ICD-10-CM | POA: Diagnosis not present

## 2020-09-16 DIAGNOSIS — Z23 Encounter for immunization: Secondary | ICD-10-CM | POA: Diagnosis not present

## 2020-09-16 DIAGNOSIS — D509 Iron deficiency anemia, unspecified: Secondary | ICD-10-CM | POA: Diagnosis not present

## 2020-09-19 DIAGNOSIS — D509 Iron deficiency anemia, unspecified: Secondary | ICD-10-CM | POA: Diagnosis not present

## 2020-09-19 DIAGNOSIS — Z23 Encounter for immunization: Secondary | ICD-10-CM | POA: Diagnosis not present

## 2020-09-19 DIAGNOSIS — Z992 Dependence on renal dialysis: Secondary | ICD-10-CM | POA: Diagnosis not present

## 2020-09-19 DIAGNOSIS — E876 Hypokalemia: Secondary | ICD-10-CM | POA: Diagnosis not present

## 2020-09-19 DIAGNOSIS — N186 End stage renal disease: Secondary | ICD-10-CM | POA: Diagnosis not present

## 2020-09-19 DIAGNOSIS — D631 Anemia in chronic kidney disease: Secondary | ICD-10-CM | POA: Diagnosis not present

## 2020-09-21 DIAGNOSIS — Z23 Encounter for immunization: Secondary | ICD-10-CM | POA: Diagnosis not present

## 2020-09-21 DIAGNOSIS — N186 End stage renal disease: Secondary | ICD-10-CM | POA: Diagnosis not present

## 2020-09-21 DIAGNOSIS — D631 Anemia in chronic kidney disease: Secondary | ICD-10-CM | POA: Diagnosis not present

## 2020-09-21 DIAGNOSIS — D509 Iron deficiency anemia, unspecified: Secondary | ICD-10-CM | POA: Diagnosis not present

## 2020-09-21 DIAGNOSIS — Z992 Dependence on renal dialysis: Secondary | ICD-10-CM | POA: Diagnosis not present

## 2020-09-21 DIAGNOSIS — E876 Hypokalemia: Secondary | ICD-10-CM | POA: Diagnosis not present

## 2020-09-22 ENCOUNTER — Ambulatory Visit (HOSPITAL_COMMUNITY): Admission: RE | Admit: 2020-09-22 | Payer: Medicare Other | Source: Ambulatory Visit

## 2020-09-23 ENCOUNTER — Encounter (HOSPITAL_COMMUNITY): Payer: Self-pay

## 2020-09-23 ENCOUNTER — Emergency Department (HOSPITAL_COMMUNITY): Payer: Medicare Other

## 2020-09-23 ENCOUNTER — Other Ambulatory Visit: Payer: Self-pay

## 2020-09-23 ENCOUNTER — Inpatient Hospital Stay (HOSPITAL_COMMUNITY)
Admission: EM | Admit: 2020-09-23 | Discharge: 2020-10-12 | DRG: 286 | Disposition: A | Discharge status: Hospice | Payer: Medicare Other | Attending: Family Medicine | Admitting: Family Medicine

## 2020-09-23 DIAGNOSIS — Z743 Need for continuous supervision: Secondary | ICD-10-CM | POA: Diagnosis not present

## 2020-09-23 DIAGNOSIS — Z515 Encounter for palliative care: Secondary | ICD-10-CM | POA: Diagnosis not present

## 2020-09-23 DIAGNOSIS — R9431 Abnormal electrocardiogram [ECG] [EKG]: Secondary | ICD-10-CM | POA: Diagnosis present

## 2020-09-23 DIAGNOSIS — Z794 Long term (current) use of insulin: Secondary | ICD-10-CM | POA: Diagnosis not present

## 2020-09-23 DIAGNOSIS — I25711 Atherosclerosis of autologous vein coronary artery bypass graft(s) with angina pectoris with documented spasm: Secondary | ICD-10-CM | POA: Diagnosis not present

## 2020-09-23 DIAGNOSIS — D631 Anemia in chronic kidney disease: Secondary | ICD-10-CM | POA: Diagnosis not present

## 2020-09-23 DIAGNOSIS — Z992 Dependence on renal dialysis: Secondary | ICD-10-CM | POA: Diagnosis not present

## 2020-09-23 DIAGNOSIS — F419 Anxiety disorder, unspecified: Secondary | ICD-10-CM | POA: Diagnosis not present

## 2020-09-23 DIAGNOSIS — J90 Pleural effusion, not elsewhere classified: Secondary | ICD-10-CM | POA: Diagnosis not present

## 2020-09-23 DIAGNOSIS — K649 Unspecified hemorrhoids: Secondary | ICD-10-CM | POA: Diagnosis present

## 2020-09-23 DIAGNOSIS — Z79899 Other long term (current) drug therapy: Secondary | ICD-10-CM | POA: Diagnosis not present

## 2020-09-23 DIAGNOSIS — M898X9 Other specified disorders of bone, unspecified site: Secondary | ICD-10-CM | POA: Diagnosis present

## 2020-09-23 DIAGNOSIS — I4891 Unspecified atrial fibrillation: Secondary | ICD-10-CM | POA: Diagnosis present

## 2020-09-23 DIAGNOSIS — Z87442 Personal history of urinary calculi: Secondary | ICD-10-CM

## 2020-09-23 DIAGNOSIS — E119 Type 2 diabetes mellitus without complications: Secondary | ICD-10-CM

## 2020-09-23 DIAGNOSIS — I480 Paroxysmal atrial fibrillation: Secondary | ICD-10-CM | POA: Diagnosis not present

## 2020-09-23 DIAGNOSIS — I259 Chronic ischemic heart disease, unspecified: Secondary | ICD-10-CM | POA: Diagnosis not present

## 2020-09-23 DIAGNOSIS — R000 Tachycardia, unspecified: Secondary | ICD-10-CM | POA: Diagnosis not present

## 2020-09-23 DIAGNOSIS — J9811 Atelectasis: Secondary | ICD-10-CM | POA: Diagnosis not present

## 2020-09-23 DIAGNOSIS — Z66 Do not resuscitate: Secondary | ICD-10-CM | POA: Diagnosis present

## 2020-09-23 DIAGNOSIS — K7290 Hepatic failure, unspecified without coma: Secondary | ICD-10-CM | POA: Diagnosis not present

## 2020-09-23 DIAGNOSIS — D509 Iron deficiency anemia, unspecified: Secondary | ICD-10-CM | POA: Diagnosis not present

## 2020-09-23 DIAGNOSIS — G9389 Other specified disorders of brain: Secondary | ICD-10-CM | POA: Diagnosis not present

## 2020-09-23 DIAGNOSIS — H04129 Dry eye syndrome of unspecified lacrimal gland: Secondary | ICD-10-CM | POA: Diagnosis not present

## 2020-09-23 DIAGNOSIS — Z6831 Body mass index (BMI) 31.0-31.9, adult: Secondary | ICD-10-CM

## 2020-09-23 DIAGNOSIS — Z823 Family history of stroke: Secondary | ICD-10-CM | POA: Diagnosis not present

## 2020-09-23 DIAGNOSIS — Z7189 Other specified counseling: Secondary | ICD-10-CM | POA: Diagnosis not present

## 2020-09-23 DIAGNOSIS — I25810 Atherosclerosis of coronary artery bypass graft(s) without angina pectoris: Secondary | ICD-10-CM | POA: Diagnosis present

## 2020-09-23 DIAGNOSIS — I120 Hypertensive chronic kidney disease with stage 5 chronic kidney disease or end stage renal disease: Secondary | ICD-10-CM | POA: Diagnosis present

## 2020-09-23 DIAGNOSIS — R55 Syncope and collapse: Secondary | ICD-10-CM | POA: Diagnosis not present

## 2020-09-23 DIAGNOSIS — D638 Anemia in other chronic diseases classified elsewhere: Secondary | ICD-10-CM | POA: Diagnosis present

## 2020-09-23 DIAGNOSIS — E785 Hyperlipidemia, unspecified: Secondary | ICD-10-CM | POA: Diagnosis present

## 2020-09-23 DIAGNOSIS — R627 Adult failure to thrive: Secondary | ICD-10-CM | POA: Diagnosis present

## 2020-09-23 DIAGNOSIS — I959 Hypotension, unspecified: Secondary | ICD-10-CM | POA: Diagnosis present

## 2020-09-23 DIAGNOSIS — Z801 Family history of malignant neoplasm of trachea, bronchus and lung: Secondary | ICD-10-CM

## 2020-09-23 DIAGNOSIS — Z7902 Long term (current) use of antithrombotics/antiplatelets: Secondary | ICD-10-CM

## 2020-09-23 DIAGNOSIS — N186 End stage renal disease: Secondary | ICD-10-CM | POA: Diagnosis not present

## 2020-09-23 DIAGNOSIS — I4510 Unspecified right bundle-branch block: Secondary | ICD-10-CM | POA: Diagnosis present

## 2020-09-23 DIAGNOSIS — M109 Gout, unspecified: Secondary | ICD-10-CM | POA: Diagnosis present

## 2020-09-23 DIAGNOSIS — Z8673 Personal history of transient ischemic attack (TIA), and cerebral infarction without residual deficits: Secondary | ICD-10-CM

## 2020-09-23 DIAGNOSIS — I472 Ventricular tachycardia, unspecified: Secondary | ICD-10-CM

## 2020-09-23 DIAGNOSIS — I10 Essential (primary) hypertension: Secondary | ICD-10-CM | POA: Diagnosis not present

## 2020-09-23 DIAGNOSIS — R7401 Elevation of levels of liver transaminase levels: Secondary | ICD-10-CM | POA: Diagnosis present

## 2020-09-23 DIAGNOSIS — E1122 Type 2 diabetes mellitus with diabetic chronic kidney disease: Secondary | ICD-10-CM | POA: Diagnosis present

## 2020-09-23 DIAGNOSIS — I2582 Chronic total occlusion of coronary artery: Secondary | ICD-10-CM | POA: Diagnosis present

## 2020-09-23 DIAGNOSIS — Z955 Presence of coronary angioplasty implant and graft: Secondary | ICD-10-CM

## 2020-09-23 DIAGNOSIS — E876 Hypokalemia: Secondary | ICD-10-CM | POA: Diagnosis not present

## 2020-09-23 DIAGNOSIS — R52 Pain, unspecified: Secondary | ICD-10-CM | POA: Diagnosis not present

## 2020-09-23 DIAGNOSIS — L89151 Pressure ulcer of sacral region, stage 1: Secondary | ICD-10-CM | POA: Diagnosis present

## 2020-09-23 DIAGNOSIS — R451 Restlessness and agitation: Secondary | ICD-10-CM | POA: Diagnosis present

## 2020-09-23 DIAGNOSIS — I469 Cardiac arrest, cause unspecified: Secondary | ICD-10-CM | POA: Diagnosis not present

## 2020-09-23 DIAGNOSIS — J189 Pneumonia, unspecified organism: Secondary | ICD-10-CM

## 2020-09-23 DIAGNOSIS — E1169 Type 2 diabetes mellitus with other specified complication: Secondary | ICD-10-CM | POA: Diagnosis present

## 2020-09-23 DIAGNOSIS — N2581 Secondary hyperparathyroidism of renal origin: Secondary | ICD-10-CM | POA: Diagnosis present

## 2020-09-23 DIAGNOSIS — I462 Cardiac arrest due to underlying cardiac condition: Secondary | ICD-10-CM | POA: Diagnosis present

## 2020-09-23 DIAGNOSIS — R410 Disorientation, unspecified: Secondary | ICD-10-CM | POA: Diagnosis not present

## 2020-09-23 DIAGNOSIS — I25710 Atherosclerosis of autologous vein coronary artery bypass graft(s) with unstable angina pectoris: Secondary | ICD-10-CM | POA: Diagnosis not present

## 2020-09-23 DIAGNOSIS — I672 Cerebral atherosclerosis: Secondary | ICD-10-CM | POA: Diagnosis not present

## 2020-09-23 DIAGNOSIS — Z8249 Family history of ischemic heart disease and other diseases of the circulatory system: Secondary | ICD-10-CM

## 2020-09-23 DIAGNOSIS — Z789 Other specified health status: Secondary | ICD-10-CM | POA: Diagnosis not present

## 2020-09-23 DIAGNOSIS — I2510 Atherosclerotic heart disease of native coronary artery without angina pectoris: Secondary | ICD-10-CM | POA: Diagnosis present

## 2020-09-23 DIAGNOSIS — Z049 Encounter for examination and observation for unspecified reason: Secondary | ICD-10-CM | POA: Diagnosis not present

## 2020-09-23 DIAGNOSIS — I25718 Atherosclerosis of autologous vein coronary artery bypass graft(s) with other forms of angina pectoris: Secondary | ICD-10-CM | POA: Diagnosis not present

## 2020-09-23 DIAGNOSIS — I25709 Atherosclerosis of coronary artery bypass graft(s), unspecified, with unspecified angina pectoris: Secondary | ICD-10-CM | POA: Diagnosis not present

## 2020-09-23 DIAGNOSIS — Z20822 Contact with and (suspected) exposure to covid-19: Secondary | ICD-10-CM | POA: Diagnosis present

## 2020-09-23 DIAGNOSIS — R638 Other symptoms and signs concerning food and fluid intake: Secondary | ICD-10-CM | POA: Diagnosis not present

## 2020-09-23 DIAGNOSIS — H9193 Unspecified hearing loss, bilateral: Secondary | ICD-10-CM | POA: Diagnosis present

## 2020-09-23 DIAGNOSIS — Z23 Encounter for immunization: Secondary | ICD-10-CM | POA: Diagnosis not present

## 2020-09-23 DIAGNOSIS — N189 Chronic kidney disease, unspecified: Secondary | ICD-10-CM | POA: Diagnosis not present

## 2020-09-23 DIAGNOSIS — R110 Nausea: Secondary | ICD-10-CM | POA: Diagnosis not present

## 2020-09-23 DIAGNOSIS — G9340 Encephalopathy, unspecified: Secondary | ICD-10-CM | POA: Diagnosis not present

## 2020-09-23 DIAGNOSIS — I6523 Occlusion and stenosis of bilateral carotid arteries: Secondary | ICD-10-CM | POA: Diagnosis present

## 2020-09-23 DIAGNOSIS — I248 Other forms of acute ischemic heart disease: Secondary | ICD-10-CM | POA: Diagnosis not present

## 2020-09-23 DIAGNOSIS — R9082 White matter disease, unspecified: Secondary | ICD-10-CM | POA: Diagnosis not present

## 2020-09-23 DIAGNOSIS — R079 Chest pain, unspecified: Secondary | ICD-10-CM | POA: Diagnosis not present

## 2020-09-23 DIAGNOSIS — I252 Old myocardial infarction: Secondary | ICD-10-CM

## 2020-09-23 DIAGNOSIS — R0602 Shortness of breath: Secondary | ICD-10-CM | POA: Diagnosis not present

## 2020-09-23 DIAGNOSIS — I4901 Ventricular fibrillation: Secondary | ICD-10-CM | POA: Diagnosis present

## 2020-09-23 DIAGNOSIS — R279 Unspecified lack of coordination: Secondary | ICD-10-CM | POA: Diagnosis not present

## 2020-09-23 DIAGNOSIS — Z8701 Personal history of pneumonia (recurrent): Secondary | ICD-10-CM

## 2020-09-23 DIAGNOSIS — R54 Age-related physical debility: Secondary | ICD-10-CM | POA: Diagnosis present

## 2020-09-23 DIAGNOSIS — J3489 Other specified disorders of nose and nasal sinuses: Secondary | ICD-10-CM | POA: Diagnosis not present

## 2020-09-23 DIAGNOSIS — R682 Dry mouth, unspecified: Secondary | ICD-10-CM | POA: Diagnosis not present

## 2020-09-23 DIAGNOSIS — I25119 Atherosclerotic heart disease of native coronary artery with unspecified angina pectoris: Secondary | ICD-10-CM | POA: Diagnosis not present

## 2020-09-23 DIAGNOSIS — I499 Cardiac arrhythmia, unspecified: Secondary | ICD-10-CM | POA: Diagnosis not present

## 2020-09-23 DIAGNOSIS — Z6829 Body mass index (BMI) 29.0-29.9, adult: Secondary | ICD-10-CM

## 2020-09-23 DIAGNOSIS — E669 Obesity, unspecified: Secondary | ICD-10-CM | POA: Diagnosis present

## 2020-09-23 DIAGNOSIS — Z85820 Personal history of malignant melanoma of skin: Secondary | ICD-10-CM

## 2020-09-23 DIAGNOSIS — Z83438 Family history of other disorder of lipoprotein metabolism and other lipidemia: Secondary | ICD-10-CM

## 2020-09-23 DIAGNOSIS — I700 Atherosclerosis of aorta: Secondary | ICD-10-CM | POA: Diagnosis not present

## 2020-09-23 DIAGNOSIS — Z833 Family history of diabetes mellitus: Secondary | ICD-10-CM

## 2020-09-23 DIAGNOSIS — L8990 Pressure ulcer of unspecified site, unspecified stage: Secondary | ICD-10-CM | POA: Insufficient documentation

## 2020-09-23 DIAGNOSIS — I255 Ischemic cardiomyopathy: Secondary | ICD-10-CM | POA: Diagnosis present

## 2020-09-23 LAB — COMPREHENSIVE METABOLIC PANEL
ALT: 27 U/L (ref 0–44)
AST: 43 U/L — ABNORMAL HIGH (ref 15–41)
Albumin: 3 g/dL — ABNORMAL LOW (ref 3.5–5.0)
Alkaline Phosphatase: 75 U/L (ref 38–126)
Anion gap: 10 (ref 5–15)
BUN: 7 mg/dL — ABNORMAL LOW (ref 8–23)
CO2: 34 mmol/L — ABNORMAL HIGH (ref 22–32)
Calcium: 7.7 mg/dL — ABNORMAL LOW (ref 8.9–10.3)
Chloride: 93 mmol/L — ABNORMAL LOW (ref 98–111)
Creatinine, Ser: 2.74 mg/dL — ABNORMAL HIGH (ref 0.44–1.00)
GFR, Estimated: 17 mL/min — ABNORMAL LOW (ref >60)
Glucose, Bld: 200 mg/dL — ABNORMAL HIGH (ref 70–99)
Potassium: 3.3 mmol/L — ABNORMAL LOW (ref 3.5–5.1)
Sodium: 137 mmol/L (ref 135–145)
Total Bilirubin: 0.6 mg/dL (ref 0.3–1.2)
Total Protein: 6.2 g/dL — ABNORMAL LOW (ref 6.5–8.1)

## 2020-09-23 LAB — CBC WITH DIFFERENTIAL/PLATELET
Abs Immature Granulocytes: 0.02 10*3/uL (ref 0.00–0.07)
Basophils Absolute: 0 10*3/uL (ref 0.0–0.1)
Basophils Relative: 0 %
Eosinophils Absolute: 0 10*3/uL (ref 0.0–0.5)
Eosinophils Relative: 0 %
HCT: 34.5 % — ABNORMAL LOW (ref 36.0–46.0)
Hemoglobin: 11 g/dL — ABNORMAL LOW (ref 12.0–15.0)
Immature Granulocytes: 0 %
Lymphocytes Relative: 13 %
Lymphs Abs: 0.7 10*3/uL (ref 0.7–4.0)
MCH: 33.8 pg (ref 26.0–34.0)
MCHC: 31.9 g/dL (ref 30.0–36.0)
MCV: 106.2 fL — ABNORMAL HIGH (ref 80.0–100.0)
Monocytes Absolute: 0.3 10*3/uL (ref 0.1–1.0)
Monocytes Relative: 6 %
Neutro Abs: 4.1 10*3/uL (ref 1.7–7.7)
Neutrophils Relative %: 81 %
Platelets: 129 10*3/uL — ABNORMAL LOW (ref 150–400)
RBC: 3.25 MIL/uL — ABNORMAL LOW (ref 3.87–5.11)
RDW: 15 % (ref 11.5–15.5)
WBC: 5.1 10*3/uL (ref 4.0–10.5)
nRBC: 0 % (ref 0.0–0.2)

## 2020-09-23 LAB — TROPONIN I (HIGH SENSITIVITY)
Troponin I (High Sensitivity): 20 ng/L — ABNORMAL HIGH (ref <18)
Troponin I (High Sensitivity): 30 ng/L — ABNORMAL HIGH (ref <18)

## 2020-09-23 LAB — PROTIME-INR
INR: 1.1 (ref 0.8–1.2)
Prothrombin Time: 14 seconds (ref 11.4–15.2)

## 2020-09-23 LAB — RESP PANEL BY RT-PCR (FLU A&B, COVID) ARPGX2
Influenza A by PCR: NEGATIVE
Influenza B by PCR: NEGATIVE
SARS Coronavirus 2 by RT PCR: NEGATIVE

## 2020-09-23 LAB — MAGNESIUM: Magnesium: 1.7 mg/dL (ref 1.7–2.4)

## 2020-09-23 LAB — PHOSPHORUS: Phosphorus: 2.2 mg/dL — ABNORMAL LOW (ref 2.5–4.6)

## 2020-09-23 MED ORDER — AMIODARONE LOAD VIA INFUSION
150.0000 mg | Freq: Once | INTRAVENOUS | Status: AC
  Administered 2020-09-23: 150 mg via INTRAVENOUS
  Filled 2020-09-23: qty 83.34

## 2020-09-23 MED ORDER — AMIODARONE HCL IN DEXTROSE 360-4.14 MG/200ML-% IV SOLN
30.0000 mg/h | INTRAVENOUS | Status: DC
  Administered 2020-09-24 – 2020-09-25 (×5): 30 mg/h via INTRAVENOUS
  Filled 2020-09-23 (×5): qty 200

## 2020-09-23 MED ORDER — AMIODARONE HCL IN DEXTROSE 360-4.14 MG/200ML-% IV SOLN
60.0000 mg/h | INTRAVENOUS | Status: AC
  Administered 2020-09-23: 60 mg/h via INTRAVENOUS
  Filled 2020-09-23 (×2): qty 200

## 2020-09-23 NOTE — H&P (Signed)
History and Physical   [name] AGT:[id number] DOB: [birth year] DOA: 09/23/2020  Referring MD/NP/PA: Dr. Ralene Espinoza  PCP: Debra Huddle, MD   Outpatient Specialists: [facility]  Patient coming from: Home  Chief Complaint: Recurrent syncope  HPI: [name] is a 80 y.o. female with medical history significant of end-stage renal disease on hemodialysis TTS, coronary artery disease, carotid stenosis, atrial fibrillation, previous history of nonsustained V. tach, diabetes, essential hypertension, hyperlipidemia, hard of hearing who was brought in today with 6 episodes of passing out.  Patient reportedly went to dialysis today around had an episode there.  She had one right after dialysis.  Came back home and had another 1 where she was laid down.  She told her she was not feeling well.  Went to check on her she had any blood in her mouth.  Suspicion for possible seizure.  When EMS picked the patient up there was apparently nonsustained V. tach bigeminy and arrhythmia on the EKG.  She became responsive other afterwards.  Patient has had pelvic cardiac arrest.  And per daughter when they found the patient unresponsive today they started CPR prior to the arrival of EMS.  EMS had started her on lidocaine GTT for what is suspected to be ventricular tachycardia.  She did have one episode of vomiting received Zofran.  On arrival here she is now in sinus rhythm on the monitor.  She is asymptomatic except for right chest wall pain probably from the CPR.  Cardiology was consulted and recommended medicine admission for work-up.  She is being admitted therefore for further work-up..  ED Course: Temperature 98.2 blood pressure 130/56 pulse 80 respirate of 19 oxygen sat 96% room air.  White count 5.1 hemoglobin 10.1 platelet 129.  Sodium 137 potassium 3.2 chloride 93 CO2 34 BUN 7 creatinine 2.74 and calcium 7.7.  COVID-19 screen is negative.  Chest x-ray shows left lower lobe consolidation  probably atelectasis infection or aspiration.  Head CT showed chronic small vessel disease without acute endocrine abnormality.  Patient is being admitted to the hospital for further work-up.  Review of Systems: As per HPI otherwise 10 point review of systems negative.    Past Medical History:  Diagnosis Date  . Anemia   . Anxiety   . Arthritis   . Atrial fibrillation ([place])   . Blood transfusion   . C. difficile diarrhea   . CAD (coronary artery disease)   . Carotid artery occlusion    Carotid Endartectom,y - left 2009.  Blockage Right being watched by Dr Scot Dock.  . Carotid stenosis   . 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 Complication of anesthesia    pt. states that she was difficult to wake  . Depression   . Diabetes mellitus without complication ([place])   . Diverticulosis   . Dysrhythmia   . General weakness 12/2015  . GERD (gastroesophageal reflux disease)   . Glaucoma   . History of hiatal hernia   . History of kidney stones    passed  . History of pneumonia   . HOH (hard of hearing)   . Hypertension   . Hypokalemia 12/2015  . Melanoma ([place])    .  top of head- melonoma  . Myocardial infarction ([place])   . Neuromuscular disorder ([place])    CARPEL TUNNEL  . Pneumonia   . SAH (subarachnoid hemorrhage) ([place]) 01/25/2017   bifrontal SAH following fall ([place])  . Shortness of  breath   . 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 Stroke ([place])    hx of TIA    Past Surgical History:  Procedure Laterality Date  . A/V FISTULAGRAM N/A 07/21/2017   Procedure: A/V FISTULAGRAM - Left Arm;  Surgeon: Angelia Mould, MD;  Location: [place] [facility];  Service: Cardiovascular;  Laterality: N/A;  . A/V FISTULAGRAM Left 03/17/2020   Procedure: A/V Fistulagram;  Surgeon: Angelia Mould, MD;  Location: [place] [facility];  Service: Cardiovascular;  Laterality: Left;  arm  . AV FISTULA PLACEMENT Left 05/31/2014   Procedure: Creation of Left Arm  arteriovenous brachiocephalic Fistula;  Surgeon: Angelia Mould, MD;  Location: [facility];  Service: Vascular;  Laterality: Left;  . AV FISTULA PLACEMENT Left 09/01/2014   Procedure: INSERTION OF ARTERIOVENOUS (AV) GORE-TEX GRAFT LEFT UPPER ARM USING  4-7 MM X 45 CM SRTETCH GORETEX GRAFT;  Surgeon: Angelia Mould, MD;  Location: [facility];  Service: Vascular;  Laterality: Left;  . BACK SURGERY    . BASCILIC VEIN TRANSPOSITION Right 07/03/2020   Procedure: BASCILIC VEIN TRANSPOSITION;  Surgeon: Angelia Mould, MD;  Location: [place];  Service: Vascular;  Laterality: Right;  . CARDIAC CATHETERIZATION    . CAROTID ENDARTERECTOMY Left 2009    CEA  . carpel tunnel    . COLONOSCOPY  07/25/2011   Procedure: COLONOSCOPY;  Surgeon: Winfield Cunas., MD;  Location: [facility];  Service: Endoscopy;  Laterality: N/A;  . CORONARY ARTERY BYPASS GRAFT  07/29/2011   Procedure: CORONARY ARTERY BYPASS GRAFTING (CABG);  Surgeon: Gaye Pollack, MD;  Location: [place];  Service: Open Heart Surgery;  Laterality: N/A;  . CORONARY STENT INTERVENTION N/A 04/08/2018   Procedure: CORONARY STENT INTERVENTION;  Surgeon: Jettie Booze, MD;  Location: [place] [facility];  Service: Cardiovascular;  Laterality: N/A;  . ENDARTERECTOMY Right 04/21/2015   Procedure: ENDARTERECTOMY CAROTID;  Surgeon: Angelia Mould, MD;  Location: [place];  Service: Vascular;  Laterality: Right;  . ESOPHAGOGASTRODUODENOSCOPY  07/25/2011   Procedure: ESOPHAGOGASTRODUODENOSCOPY (EGD);  Surgeon: Winfield Cunas., MD;  Location: [facility] ENDOSCOPY;  Service: Endoscopy;  Laterality: N/A;  . EYE SURGERY    . FISTULOGRAM Left 08/29/2014   Procedure: FISTULOGRAM;  Surgeon: Angelia Mould, MD;  Location: [facility] C[facility] LAB;  Service: Cardiovascular;  Laterality: Left;  . KNEE SURGERY Left   . LEFT HEART CATH AND CORS/GRAFTS ANGIOGRAPHY N/A 04/08/2018   Procedure: LEFT HEART CATH AND CORS/GRAFTS ANGIOGRAPHY;  Surgeon: Jettie Booze, MD;  Location: [place] [facility];  Service: Cardiovascular;  Laterality: N/A;  . LUMBAR LAMINECTOMY/DECOMPRESSION MICRODISCECTOMY N/A 12/01/2015   Procedure: L5-S1 Decompression and Bilateral Microdiscectomy;  Surgeon: Marybelle Killings, MD;  Location: [place];  Service: Orthopedics;  Laterality: N/A;  . NECK SURGERY    . PERIPHERAL VASCULAR BALLOON ANGIOPLASTY  07/21/2017   Procedure: PERIPHERAL VASCULAR BALLOON ANGIOPLASTY;  Surgeon: Angelia Mould, MD;  Location: [place] [facility];  Service: Cardiovascular;;  LT Arm AVF  . TONSILLECTOMY       reports that she has never smoked. She has never used smokeless tobacco. She reports that she does not drink alcohol and does not use drugs.  Allergies  Allergen Reactions  . Neurontin [Gabapentin] Other (See Comments)    Hallucinations and "Makes me go crazy"   . Sulfa Antibiotics Other (See Comments)    Altered mental state   . Norvasc [Amlodipine] Other (See Comments)    "  Makes my legs swell"; edema    Family History  Problem Relation Age of Onset  . Diabetes Mother   . Hypertension Mother   . Heart disease Mother        before age 7  . Heart attack Mother   . Stroke Mother   . Hyperlipidemia Father   . Hypertension Father   . Colon polyps Father   . Lung cancer Father   . Deep vein thrombosis Daughter   . Diabetes Daughter   . Hyperlipidemia Daughter   . Hypertension Daughter   . Heart disease Daughter   . Peripheral vascular disease Daughter   . Breast cancer Neg Hx      Prior to Admission medications   Medication Sig Start Date End Date Taking? Authorizing Provider  acetaminophen (TYLENOL) 325 MG tablet Take 325-650 mg by mouth 3 (three) times daily as needed for headache (pain).   Yes [provider]  allopurinol (ZYLOPRIM) 100 MG tablet Take 100 mg by mouth in the morning.   Yes [provider]  atorvastatin (LIPITOR) 40 MG tablet Take 1 tablet (40 mg total) by mouth daily at 6 PM. Patient taking  differently: Take 40 mg by mouth at bedtime. 04/28/18  Yes Angiulli, Lavon Paganini, PA-C  b complex vitamins capsule Take 1 capsule by mouth at bedtime.   Yes [provider]  brimonidine (ALPHAGAN P) 0.1 % SOLN Place 1-2 drops into both eyes See admin instructions. Instill one drop into right eye daily at bedtime, instill two drops into left eye daily at bedtime   Yes [provider]  Calcium Carbonate Antacid (TUMS PO) Take 1 tablet by mouth daily with lunch.   Yes [provider]  Calcium Polycarbophil (FIBER-LAX PO) Take 1 tablet by mouth 2 (two) times daily.   Yes [provider]  Cholecalciferol (VITAMIN D) 2000 UNITS tablet Take 2,000 Units by mouth 2 (two) times daily.   Yes [provider]  cinacalcet (SENSIPAR) 30 MG tablet Take 30 mg by mouth See admin instructions. Take one tablet (30 mg) by mouth on Tuesday and Thursday nights 02/14/20  Yes [provider]  citalopram (CELEXA) 20 MG tablet Take 20 mg by mouth in the morning. 02/08/20  Yes [provider]  clopidogrel (PLAVIX) 75 MG tablet Take 1 tablet (75 mg total) by mouth daily. Patient taking differently: Take 75 mg by mouth in the morning. 05/08/20  Yes Jettie Booze, MD  Cyanocobalamin (VITAMIN B-12) 1000 MCG SUBL Place 1,000 mcg under the tongue in the morning.   Yes [provider]  famotidine (PEPCID) 20 MG tablet Take 20 mg by mouth daily as needed for heartburn or indigestion.   Yes [provider]  hydrALAZINE (APRESOLINE) 25 MG tablet TAKE 1 TABLET BY MOUTH TWICE DAILY. ON DAYS OF DIALYSIS TAKE 1 TABLET BY MOUTH IN THE EVENING Patient taking differently: Take 25 mg by mouth See admin instructions. Take one tablet (25 mg) by mouth on Sunday, Monday, Wednesday, Friday mornings (non-dialysis days); take one tablet (25 mg) by mouth daily at bedtime 03/06/20  Yes Jettie Booze, MD  insulin aspart (NOVOLOG FLEXPEN) 100 UNIT/ML FlexPen Inject 10-20  Units into the skin at bedtime as needed for high blood sugar (CBG >150).   Yes [provider]  lidocaine-prilocaine (EMLA) cream Apply 1 application topically daily as needed (one hour prior to dialysis). 08/10/20  Yes [provider]  multivitamin (RENA-VIT) TABS tablet Take 1 tablet by mouth at  bedtime.   Yes [provider]  nitroGLYCERIN (NITROSTAT) 0.4 MG SL tablet Place 1 tablet (0.4 mg total) under the tongue every 5 (five) minutes as needed for chest pain. 05/08/20  Yes Jettie Booze, MD  Omega-3 Fatty Acids (FISH OIL) 1200 MG CAPS Take 1,200 mg by mouth 2 (two) times daily.   Yes [provider]  Probiotic Product (PROBIOTIC DAILY PO) Take 1 tablet by mouth every evening.   Yes [provider]  sevelamer carbonate (RENVELA) 800 MG tablet Take 1 tablet (800 mg total) by mouth 3 (three) times daily with meals. Patient taking differently: Take 1,600 mg by mouth 2 (two) times daily with a meal. 04/28/18  Yes Angiulli, Lavon Paganini, PA-C  timolol (TIMOPTIC) 0.5 % ophthalmic solution Place 1 drop into the left eye at bedtime.   Yes [provider]  Travoprost, BAK Free, (TRAVATAN) 0.004 % SOLN ophthalmic solution Place 1 drop into both eyes at bedtime. 09/09/20  Yes [provider]  Insulin Glargine (LANTUS) 100 UNIT/ML Solostar Pen Inject 15 Units into the skin daily. Patient not taking: No sig reported 04/28/18   Angiulli, Lavon Paganini, PA-C  Insulin Pen Needle (PEN NEEDLES) 31G X 8 MM MISC 15 Units by Does not apply route daily. 04/30/18   Meredith Staggers, MD    Physical Exam: Vitals:   09/23/20 2100 09/23/20 2115 09/23/20 2145 09/23/20 2200  BP: (!) 120/50 (!) 127/52 (!) 133/48 (!) 129/56  Pulse: 88 87 88 88  Resp: 19 17 15 16   Temp:      TempSrc:      SpO2: 100% 100% 100% 100%  Weight:      Height:          Constitutional: Chronically ill looking no distress Vitals:   09/23/20 2100 09/23/20 2115 09/23/20 2145  09/23/20 2200  BP: (!) 120/50 (!) 127/52 (!) 133/48 (!) 129/56  Pulse: 88 87 88 88  Resp: 19 17 15 16   Temp:      TempSrc:      SpO2: 100% 100% 100% 100%  Weight:      Height:       Eyes: PERRL, lids and conjunctivae normal ENMT: Mucous membranes are moist. Posterior pharynx clear of any exudate or lesions.Normal dentition.  Neck: normal, supple, no masses, no thyromegaly Respiratory: clear to auscultation bilaterally, no wheezing, mild bilateral crackles. Normal respiratory effort. No accessory muscle use.  Cardiovascular: Regular rate and rhythm, no murmurs / rubs / gallops. No extremity edema. 2+ pedal pulses. No carotid bruits.  Abdomen: no tenderness, no masses palpated. No hepatosplenomegaly. Bowel sounds positive.  Musculoskeletal: no clubbing / cyanosis. No joint deformity upper and lower extremities. Good ROM, no contractures. Normal muscle tone.  Skin: no rashes, lesions, ulcers. No induration Neurologic: CN 2-12 grossly intact. Sensation intact, DTR normal. Strength 5/5 in all 4.  Psychiatric: Normal judgment and insight. Alert and oriented x 3. Normal mood.     Labs on Admission: I have personally reviewed following labs and imaging studies  CBC: Recent Labs  Lab 09/23/20 1937  WBC 5.1  NEUTROABS 4.1  HGB 11.0*  HCT 34.5*  MCV 106.2*  PLT 673*   Basic Metabolic Panel: Recent Labs  Lab 09/23/20 1937  NA 137  K 3.3*  CL 93*  CO2 34*  GLUCOSE 200*  BUN 7*  CREATININE 2.74*  CALCIUM 7.7*  MG 1.7  PHOS 2.2*   GFR: Estimated Creatinine Clearance: 16.5 mL/min (A) (by C-G formula based on  SCr of 2.74 mg/dL (H)). Liver Function Tests: Recent Labs  Lab 09/23/20 1937  AST 43*  ALT 27  ALKPHOS 75  BILITOT 0.6  PROT 6.2*  ALBUMIN 3.0*   No results for input(s): LIPASE, AMYLASE in the last 168 hours. No results for input(s): AMMONIA in the last 168 hours. Coagulation Profile: Recent Labs  Lab 09/23/20 1937  INR 1.1   Cardiac Enzymes: No results  for input(s): CKTOTAL, CKMB, CKMBINDEX, TROPONINI in the last 168 hours. BNP (last 3 results) No results for input(s): PROBNP in the last 8760 hours. HbA1C: No results for input(s): HGBA1C in the last 72 hours. CBG: No results for input(s): GLUCAP in the last 168 hours. Lipid Profile: No results for input(s): CHOL, HDL, LDLCALC, TRIG, CHOLHDL, LDLDIRECT in the last 72 hours. Thyroid Function Tests: No results for input(s): TSH, T4TOTAL, FREET4, T3FREE, THYROIDAB in the last 72 hours. Anemia Panel: No results for input(s): VITAMINB12, FOLATE, FERRITIN, TIBC, IRON, RETICCTPCT in the last 72 hours. Urine analysis:    Component Value Date/Time   COLORURINE YELLOW 12/21/2015 2222   APPEARANCEUR CLEAR 12/21/2015 2222   LABSPEC 1.013 12/21/2015 2222   PHURINE 6.5 12/21/2015 2222   GLUCOSEU NEGATIVE 12/21/2015 2222   HGBUR MODERATE (A) 12/21/2015 2222   BILIRUBINUR NEGATIVE 12/21/2015 2222   KETONESUR NEGATIVE 12/21/2015 2222   PROTEINUR >300 (A) 12/21/2015 2222   UROBILINOGEN 0.2 04/19/2015 1248   NITRITE POSITIVE (A) 12/21/2015 2222   LEUKOCYTESUR TRACE (A) 12/21/2015 2222   Sepsis Labs: @LABRCNTIP (procalcitonin:4,lacticidven:4) ) Recent Results (from the past 240 hour(s))  Resp Panel by RT-PCR (Flu A&B, Covid) Nasopharyngeal Swab     Status: None   Collection Time: 09/23/20  7:59 PM   Specimen: Nasopharyngeal Swab; Nasopharyngeal(NP) swabs in vial transport medium  Result Value Ref Range Status   SARS Coronavirus 2 by RT PCR NEGATIVE NEGATIVE Final    Comment: (NOTE) SARS-CoV-2 target nucleic acids are NOT DETECTED.  The SARS-CoV-2 RNA is generally detectable in upper respiratory specimens during the acute phase of infection. The lowest concentration of SARS-CoV-2 viral copies this assay can detect is 138 copies/mL. A negative result does not preclude SARS-Cov-2 infection and should not be used as the sole basis for treatment or other patient management decisions. A negative  result may occur with  improper specimen collection/handling, submission of specimen other than nasopharyngeal swab, presence of viral mutation(s) within the areas targeted by this assay, and inadequate number of viral copies(<138 copies/mL). A negative result must be combined with clinical observations, patient history, and epidemiological information. The expected result is Negative.  Fact Sheet for Patients:  EntrepreneurPulse.com.au  Fact Sheet for Healthcare Providers:  IncredibleEmployment.be  This test is no t yet approved or cleared by the [place] FDA and  has been authorized for detection and/or diagnosis of SARS-CoV-2 by FDA under an Emergency Use Authorization (EUA). This EUA will remain  in effect (meaning this test can be used) for the duration of the COVID-19 declaration under Section 564(b)(1) of the Act, 21 U.S.C.section 360bbb-3(b)(1), unless the authorization is terminated  or revoked sooner.       Influenza A by PCR NEGATIVE NEGATIVE Final   Influenza B by PCR NEGATIVE NEGATIVE Final    Comment: (NOTE) The Xpert Xpress SARS-CoV-2/FLU/RSV plus assay is intended as an aid in the diagnosis of influenza from Nasopharyngeal swab specimens and should not be used as a sole basis for treatment. Nasal washings and aspirates are unacceptable for Xpert Xpress SARS-CoV-2/FLU/RSV testing.  Fact  Sheet for Patients: EntrepreneurPulse.com.au  Fact Sheet for Healthcare Providers: IncredibleEmployment.be  This test is not yet approved or cleared by the [place] FDA and has been authorized for detection and/or diagnosis of SARS-CoV-2 by FDA under an Emergency Use Authorization (EUA). This EUA will remain in effect (meaning this test can be used) for the duration of the COVID-19 declaration under Section 564(b)(1) of the Act, 21 U.S.C. section 360bbb-3(b)(1), unless the authorization is  terminated or revoked.  Performed at [facility], [place] [address] [place], [place] [address]      Radiological Exams on Admission: CT Head Wo Contrast  Result Date: 09/23/2020 CLINICAL DATA:  Encephalopathy EXAM: CT HEAD WITHOUT CONTRAST TECHNIQUE: Contiguous axial images were obtained from the base of the skull through the vertex without intravenous contrast. COMPARISON:  None. FINDINGS: Brain: There is no mass, hemorrhage or extra-axial collection. The size and configuration of the ventricles and extra-axial CSF spaces are normal. There is hypoattenuation of the white matter, most commonly indicating chronic small vessel disease. Vascular: Atherosclerotic calcification of the internal carotid arteries at the skull base. No abnormal hyperdensity of the major intracranial arteries or dural venous sinuses. Skull: The visualized skull base, calvarium and extracranial soft tissues are normal. Sinuses/Orbits: Right maxillary sinus mucosal thickening. The orbits are normal. IMPRESSION: Chronic small vessel disease without acute intracranial abnormality. Electronically Signed   By: Ulyses Jarred M.D.   On: 09/23/2020 20:47   DG Chest Port 1 View  Result Date: 09/23/2020 CLINICAL DATA:  Ventricular tachycardia, cardiac arrest status post resuscitation EXAM: PORTABLE CHEST 1 VIEW COMPARISON:  04/10/2018 FINDINGS: Single frontal view of the chest demonstrates stable enlargement the cardiac silhouette, with postsurgical changes from median sternotomy and bypass surgery. Retrocardiac consolidation may reflect atelectasis, infection, or aspiration. Small left pleural effusion. No pneumothorax. No acute displaced fractures. Vascular stent left axillary region. IMPRESSION: 1. Left lower lobe consolidation may reflect atelectasis, infection, or aspiration. 2. Small left pleural effusion. Electronically Signed   By: Randa Ngo M.D.   On: 09/23/2020 19:21    EKG: Independently reviewed.  It shows sinus  rhythm with right bundle branch block.  Does not appear to be significant different from previous.  Assessment/Plan Principal Problem:   Syncope and collapse Active Problems:   Atrial fibrillation (HCC)   CAD (coronary artery disease)   Hypokalemia   QT prolongation   Benign essential HTN   Nonsustained ventricular tachycardia (HCC)   ESRD on dialysis (HCC)   Anemia of chronic disease   Diabetes mellitus type 2 in nonobese [facility])     #1 multiple syncopal episodes: Patient admitted and we will monitor on telemetry.  Cardiology consulted.  Recommended starting amiodarone drip which patient will continue with.  May end of transition to oral.  Get echocardiogram.  PT OT consultation.  Consider work-up for seizure including MRI of the brain.  To rule out neurologic cause.  Although due to patient's well known cardiac history it is likely that this is all cardiac.  #2 paroxysmal atrial fibrillation: Currently in sinus rhythm.  Not a candidate for anticoagulation due to previous brain bleed.  Continue with amiodarone.  #3 diabetes: Sliding scale insulin  #4 essential hypertension: Blood pressures well controlled.  Continue monitoring  #5 end-stage renal disease: Continue per nephrology.  Patient had hemodialysis today.  #6 anemia of chronic disease: Monitor H&H  #7 hypokalemia: Replete.   DVT prophylaxis: Heparin Code Status: Partial code Family Communication: Daughter at bedside Disposition Plan: Home Consults  called: Cardiology fellow on-call Admission status: Inpatient  Severity of Illness: The appropriate patient status for this patient is INPATIENT. Inpatient status is judged to be reasonable and necessary in order to provide the required intensity of service to ensure the patient's safety. The patient's presenting symptoms, physical exam findings, and initial radiographic and laboratory data in the context of their chronic comorbidities is felt to place them at high risk for  further clinical deterioration. Furthermore, it is not anticipated that the patient will be medically stable for discharge from the hospital within 2 midnights of admission. The following factors support the patient status of inpatient.   " The patient's presenting symptoms include multiple episodes of passing out versus seizure. " The worrisome physical exam findings include patient now stable. " The initial radiographic and laboratory data are worrisome because of no acute findings. " The chronic co-morbidities include history of nonsustained V. tach.   * I certify that at the point of admission it is my clinical judgment that the patient will require inpatient hospital care spanning beyond 2 midnights from the point of admission due to high intensity of service, high risk for further deterioration and high frequency of surveillance required.Barbette Merino MD [facility] Pager [phone]  If 7PM-7AM, please contact night-coverage [email or web address] Password [facility]  09/23/2020, 11:03 PM

## 2020-09-23 NOTE — ED Notes (Signed)
Nevin Bloodgood, daughter, [phone] would like an update when available

## 2020-09-23 NOTE — Progress Notes (Incomplete)
Woke up felt norma Went to shower Face felt warm  Had issues then thinking, fuzzy Felt that it only last few seconds  No LOC, was sitting in her shower chair to take shower  Falling episodes around 6 mo ago  No LOC  Says catch me its going to fall  She can feel episodes coming on  Second or two beofre it happens she says her head feels "foolish" like it is really heavy  Sometime can catch herself, sometimes not Occasional skin tears Unsure if she completely LOC, if so very brief   Got out of shower and went to dialysis  Went to [facility] for dessert  She said its going to happen again  Felt flushed in face  In the car in line at [facility]  hasnt driven in yaers She weas passenger in car at [facility] sitting down  She was with 2 grandchildren She said she was hot, rolled window down Then was in [place] In a "trance" and they had to shake her a lot to get her to respond Thought it was her sugar because she is diabetic She has similar sx when BG low, breaks out in sweat and gets crazy  This was different in [facility] line, feels flushed in face and can't think  Then BG 196 at home but this was after she ate parfait  Daughter made her eat at home  Dad is in hospital right now, her husband, so she hasnt been eating well  Happened again at home, after parfait and after food Jerked back in chair, she said its happeneidng Went limp Few minutes  CPR during the whole time  Wasn't responding at all during CPR openmed eyes diuring CPR and was confused  She said its coming again, 2-3 minutes after she recoverd Second round of CPR ~5 minutes  Blank stare again following CPR  tehn ambulance Memory back and remembers EMS  Threw up when they rolled her over Another episode in EMS transport, HR >200, regular and WCT   Question mild chest tightness after dialysis  Has had chest tightness before    HR 71 BP 92/36 (52), 100% RA, RR 21  Sept 19 2019 Heard her drop plate at home CPR at home  Ambulance to  hospital  Shocked x5x \\VF  Nothing they could do  rec hospice  caht and found blockage

## 2020-09-23 NOTE — ED Triage Notes (Signed)
Pt bib [facility] from home. Pt lives at home with husband. Pt had dialysis today and was not feeling well.  When family went to check on pt, she was not breathing and had blood in her mouth. Family reported possible seizure to EMS.  When EMS arrived, pt was breathing, bigeminy on monitor on their arrival. In route pt was unresponsive, had posturing and was in venticular tachycardia per monitor.  Pt converted and became responsive on her own.  Pt reported to EMS that pt had previously had an episode of cardiac arrest. EMS started a lidocaine gtt and given zofran for vomiting after VT.  Pt A&O on arrival, SR, HR=87.

## 2020-09-23 NOTE — Consult Note (Addendum)
Cardiology Consultation:   Patient ID: [name] MRN: [id number]; DOB: 05-31-1941  Admit date: 09/23/2020 Date of Consult: 09/23/2020  Primary Care Provider: Josetta Huddle, MD [facility] Cardiologist: Debra Grooms, MD  [facility] Electrophysiologist:  None   Patient Profile:   [phone] with CAD s/p CABG x3 (LIMA-LAD, SVG-diag, SVG-RCA), prior VF arrest in the setting of SVG occlusion , ESRD on iHD, post op AF, TIA, CAS s/p L CAE (2009), MDD, DM2, diverticulosis, GERD, glaucoma, and HTN who presents following recurrent LOC and subsequent witnessed arrest.   History of Present Illness:   [name] was in her normal state of health this morning when she woke up and got the shower prior to dialysis.  In the shower she had a warm sensation over her face and felt that her thinking was "fuzzy" for a few seconds.  She denies any abrupt loss of consciousness and was sitting in her shower chair at the time.  Her symptoms only lasted for a few seconds and then she is back to her normal self.  She has had occasional falls in the past with most recent being over 6 months ago however denies any loss of consciousness or similar symptoms to those that she experienced today during his falls.  She usually can catch herself and has not fallen at least 6 months.  After getting out of shower today she got dressed and went to her dialysis without any issues.  Following dialysis her 2 grandchildren took her to [name] for dessert.  While they were in the drive-through line she told them that she was feeling hot and pulled out the window.  Seconds later they said that she is in a daze/trancelike state and unresponsive.  They shook her fairly aggressively and she eventually returned back to her baseline.  They were concerned that her blood sugar was low however she returned to her baseline prior to the giving her any food or dessert.  Once she got home checked her blood sugar was 196 however this was  shortly after she ate her dessert.  Her daughter came into the house to check on her and made her lunch as she had been eating as well recently with her husband being hospitalized for other medical problems.  Unfortunately following her lunch she was sitting in a chair and said "its happening again."  She went letter a few seconds later and her eyes rolled back.  She had a few jerks of her body while in the chair and her daughter immediately got her on the ground and performed CPR for several minutes.  She was unresponsive during CPR but eventually regained consciousness after a few minutes and opened her eyes once mildly confused.  She rapidly recovered although 2 to 3 minutes later she said I feel it happening again and her daughter along with her husband performed the second round of CPR for several additional minutes until EMS arrived.  In route to the hospital EMS reported a wide-complex tachycardia greater than 200 however the terminated prior to them defibrillating her.  [name] does remember sitting in her chair and feeling a warm sensation flushed over her but does not require anything at all until EMS arrived.  The sensations were similar to the ones that she experienced in which it for a long time and in the shower.  She denied any significant chest pain or pressure but said she may have had some very mild chest tightness before dialysis which was nonexertional and  has intermittently occurred in the past as well.  Past Medical History:  Diagnosis Date  . Anemia   . Anxiety   . Arthritis   . Atrial fibrillation ([place])   . Blood transfusion   . C. difficile diarrhea   . CAD (coronary artery disease)   . Carotid artery occlusion    Carotid Endartectom,y - left 2009.  Blockage Right being watched by [name].  . Carotid stenosis   . 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 Complication of anesthesia    pt. states that she was difficult to wake  . Depression   . Diabetes  mellitus without complication ([place])   . Diverticulosis   . Dysrhythmia   . General weakness 12/2015  . GERD (gastroesophageal reflux disease)   . Glaucoma   . History of hiatal hernia   . History of kidney stones    passed  . History of pneumonia   . HOH (hard of hearing)   . Hypertension   . Hypokalemia 12/2015  . Melanoma ([place])    .  top of head- melonoma  . Myocardial infarction ([place])   . Neuromuscular disorder ([place])    CARPEL TUNNEL  . Pneumonia   . SAH (subarachnoid hemorrhage) ([place]) 01/25/2017   bifrontal SAH following fall ([place])  . Shortness of breath   . Skin cancer of lip    [name]  . Stroke ([place])    hx of TIA   Past Surgical History:  Procedure Laterality Date  . A/V FISTULAGRAM N/A 07/21/2017   Procedure: A/V FISTULAGRAM - Left Arm;  Surgeon: Debra Mould, MD;  Location: [place] [facility];  Service: Cardiovascular;  Laterality: N/A;  . A/V FISTULAGRAM Left 03/17/2020   Procedure: A/V Fistulagram;  Surgeon: Debra Mould, MD;  Location: [place] [facility];  Service: Cardiovascular;  Laterality: Left;  arm  . AV FISTULA PLACEMENT Left 05/31/2014   Procedure: Creation of Left Arm arteriovenous brachiocephalic Fistula;  Surgeon: Debra Mould, MD;  Location: [place];  Service: Vascular;  Laterality: Left;  . AV FISTULA PLACEMENT Left 09/01/2014   Procedure: INSERTION OF ARTERIOVENOUS (AV) GORE-TEX GRAFT LEFT UPPER ARM USING  4-7 MM X 45 CM SRTETCH GORETEX GRAFT;  Surgeon: Debra Mould, MD;  Location: [place];  Service: Vascular;  Laterality: Left;  . BACK SURGERY    . BASCILIC VEIN TRANSPOSITION Right 07/03/2020   Procedure: BASCILIC VEIN TRANSPOSITION;  Surgeon: Debra Mould, MD;  Location: [place];  Service: Vascular;  Laterality: Right;  . CARDIAC CATHETERIZATION    . CAROTID ENDARTERECTOMY Left 2009    CEA  . carpel tunnel    . COLONOSCOPY  07/25/2011   Procedure: COLONOSCOPY;  Surgeon: [name].,  MD;  Location: [facility] ENDOSCOPY;  Service: Endoscopy;  Laterality: N/A;  . CORONARY ARTERY BYPASS GRAFT  07/29/2011   Procedure: CORONARY ARTERY BYPASS GRAFTING (CABG);  Surgeon: Gaye Pollack, MD;  Location: [place];  Service: Open Heart Surgery;  Laterality: N/A;  . CORONARY STENT INTERVENTION N/A 04/08/2018   Procedure: CORONARY STENT INTERVENTION;  Surgeon: Jettie Booze, MD;  Location: [place] [facility];  Service: Cardiovascular;  Laterality: N/A;  . ENDARTERECTOMY Right 04/21/2015   Procedure: ENDARTERECTOMY CAROTID;  Surgeon: Debra Mould, MD;  Location: [place];  Service: Vascular;  Laterality: Right;  . ESOPHAGOGASTRODUODENOSCOPY  07/25/2011   Procedure: ESOPHAGOGASTRODUODENOSCOPY (EGD);  Surgeon: [name]., MD;  Location:  [place] ENDOSCOPY;  Service: Endoscopy;  Laterality: N/A;  . EYE SURGERY    . FISTULOGRAM Left 08/29/2014   Procedure: FISTULOGRAM;  Surgeon: Debra Mould, MD;  [facility] CATH LAB;  Service: Cardiovascular;  Laterality: Left;  . KNEE SURGERY Left   . LEFT HEART CATH AND CORS/GRAFTS ANGIOGRAPHY N/A 04/08/2018   Procedure: LEFT HEART CATH AND CORS/GRAFTS ANGIOGRAPHY;  Surgeon: Jettie Booze, MD;  Location: [place] [facility];  Service: Cardiovascular;  Laterality: N/A;  . LUMBAR LAMINECTOMY/DECOMPRESSION MICRODISCECTOMY N/A 12/01/2015   Procedure: L5-S1 Decompression and Bilateral Microdiscectomy;  Surgeon: Marybelle Killings, MD;  Location: [place];  Service: Orthopedics;  Laterality: N/A;  . NECK SURGERY    . PERIPHERAL VASCULAR BALLOON ANGIOPLASTY  07/21/2017   Procedure: PERIPHERAL VASCULAR BALLOON ANGIOPLASTY;  Surgeon: Debra Mould, MD;  Location: [place] [facility];  Service: Cardiovascular;;  LT Arm AVF  . TONSILLECTOMY      Home Medications:  Prior to Admission medications   Medication Sig Start Date End Date Taking? Authorizing Provider  acetaminophen (TYLENOL) 325 MG tablet Take 325-650 mg by mouth 3 (three) times daily as  needed for headache (pain).   Yes [provider]  allopurinol (ZYLOPRIM) 100 MG tablet Take 100 mg by mouth in the morning.   Yes [provider]  atorvastatin (LIPITOR) 40 MG tablet Take 1 tablet (40 mg total) by mouth daily at 6 PM. Patient taking differently: Take 40 mg by mouth at bedtime. 04/28/18  Yes Angiulli, Lavon Paganini, PA-C  b complex vitamins capsule Take 1 capsule by mouth at bedtime.   Yes [provider]  brimonidine (ALPHAGAN P) 0.1 % SOLN Place 1-2 drops into both eyes See admin instructions. Instill one drop into right eye daily at bedtime, instill two drops into left eye daily at bedtime   Yes [provider]  Calcium Carbonate Antacid (TUMS PO) Take 1 tablet by mouth daily with lunch.   Yes [provider]  Calcium Polycarbophil (FIBER-LAX PO) Take 1 tablet by mouth 2 (two) times daily.   Yes [provider]  Cholecalciferol (VITAMIN D) 2000 UNITS tablet Take 2,000 Units by mouth 2 (two) times daily.   Yes [provider]  cinacalcet (SENSIPAR) 30 MG tablet Take 30 mg by mouth See admin instructions. Take one tablet (30 mg) by mouth on Tuesday and Thursday nights 02/14/20  Yes [provider]  citalopram (CELEXA) 20 MG tablet Take 20 mg by mouth in the morning. 02/08/20  Yes [provider]  clopidogrel (PLAVIX) 75 MG tablet Take 1 tablet (75 mg total) by mouth daily. Patient taking differently: Take 75 mg by mouth in the morning. 05/08/20  Yes Jettie Booze, MD  Cyanocobalamin (VITAMIN B-12) 1000 MCG SUBL Place 1,000 mcg under the tongue in the morning.   Yes [provider]  famotidine (PEPCID) 20 MG tablet Take 20 mg by mouth daily as needed for heartburn or indigestion.   Yes [provider]  hydrALAZINE (APRESOLINE) 25 MG tablet TAKE 1 TABLET BY MOUTH TWICE DAILY. ON DAYS OF DIALYSIS TAKE 1 TABLET BY MOUTH IN THE EVENING Patient taking differently: Take 25 mg by mouth See  admin instructions. Take one tablet (25 mg) by mouth on Sunday, Monday, Wednesday, Friday mornings (non-dialysis days); take one tablet (25 mg) by mouth daily at bedtime 03/06/20  Yes Jettie Booze, MD  insulin aspart (NOVOLOG FLEXPEN) 100 UNIT/ML FlexPen Inject 10-20 Units into the skin at bedtime as needed  for high blood sugar (CBG >150).   Yes [provider]  lidocaine-prilocaine (EMLA) cream Apply 1 application topically daily as needed (one hour prior to dialysis). 08/10/20  Yes [provider]  multivitamin (RENA-VIT) TABS tablet Take 1 tablet by mouth at bedtime.   Yes [provider]  nitroGLYCERIN (NITROSTAT) 0.4 MG SL tablet Place 1 tablet (0.4 mg total) under the tongue every 5 (five) minutes as needed for chest pain. 05/08/20  Yes Jettie Booze, MD  Omega-3 Fatty Acids (FISH OIL) 1200 MG CAPS Take 1,200 mg by mouth 2 (two) times daily.   Yes [provider]  Probiotic Product (PROBIOTIC DAILY PO) Take 1 tablet by mouth every evening.   Yes [provider]  sevelamer carbonate (RENVELA) 800 MG tablet Take 1 tablet (800 mg total) by mouth 3 (three) times daily with meals. Patient taking differently: Take 1,600 mg by mouth 2 (two) times daily with a meal. 04/28/18  Yes Angiulli, Lavon Paganini, PA-C  timolol (TIMOPTIC) 0.5 % ophthalmic solution Place 1 drop into the left eye at bedtime.   Yes [provider]  Travoprost, BAK Free, (TRAVATAN) 0.004 % SOLN ophthalmic solution Place 1 drop into both eyes at bedtime. 09/09/20  Yes [provider]  Insulin Glargine (LANTUS) 100 UNIT/ML Solostar Pen Inject 15 Units into the skin daily. Patient not taking: No sig reported 04/28/18   Angiulli, Lavon Paganini, PA-C  Insulin Pen Needle (PEN NEEDLES) 31G X 8 MM MISC 15 Units by Does not apply route daily. 04/30/18   Meredith Staggers, MD    Inpatient Medications: Scheduled Meds:  Continuous Infusions: . sodium chloride    . amiodarone  60 mg/hr (09/23/20 2226)   Followed by  . [START ON 09/24/2020] amiodarone     PRN Meds: sodium chloride  Allergies:    Allergies  Allergen Reactions  . Neurontin [Gabapentin] Other (See Comments)    Hallucinations and "Makes me go crazy"   . Sulfa Antibiotics Other (See Comments)    Altered mental state   . Norvasc [Amlodipine] Other (See Comments)    "Makes my legs swell"; edema    Social History:   Social History   Socioeconomic History  . Marital status: Married    Spouse name: Not on file  . Number of children: 2  . Years of education: Not on file  . Highest education level: Not on file  Occupational History  . Occupation: retired  Tobacco Use  . Smoking status: Never Smoker  . Smokeless tobacco: Never Used  Vaping Use  . Vaping Use: Never used  Substance and Sexual Activity  . Alcohol use: No    Alcohol/week: 0.0 standard drinks  . Drug use: No  . Sexual activity: Not Currently    Birth control/protection: Post-menopausal  Other Topics Concern  . Not on file  Social History Narrative  . Not on file   Social Determinants of Health   Financial Resource Strain: Not on file  Food Insecurity: Not on file  Transportation Needs: Not on file  Physical Activity: Not on file  Stress: Not on file  Social Connections: Not on file  Intimate Partner Violence: Not on file    Family History:    Family History  Problem Relation Age of Onset  . Diabetes Mother   . Hypertension Mother   . Heart disease Mother        before age 54  . Heart attack Mother   . Stroke Mother   .  Hyperlipidemia Father   . Hypertension Father   . Colon polyps Father   . Lung cancer Father   . Deep vein thrombosis Daughter   . Diabetes Daughter   . Hyperlipidemia Daughter   . Hypertension Daughter   . Heart disease Daughter   . Peripheral vascular disease Daughter   . Breast cancer Neg Hx     ROS:  Review of Systems: [y] = yes, [ ]  = no       General: Weight gain [ ] ;  Weight loss [ ] ; Anorexia [ ] ; Fatigue [ ] ; Fever [ ] ; Chills [ ] ; Weakness [ ]     Cardiac: Chest pain/pressure [y]; Resting SOB [ ] ; Exertional SOB [ ] ; Orthopnea [ ] ; Pedal Edema [ ] ; Palpitations [ ] ; Syncope [y]; Presyncope [ ] ; Paroxysmal nocturnal dyspnea [ ]     Pulmonary: Cough [ ] ; Wheezing [ ] ; Hemoptysis [ ] ; Sputum [ ] ; Snoring [ ]     GI: Vomiting [ ] ; Dysphagia [ ] ; Melena [ ] ; Hematochezia [ ] ; Heartburn [ ] ; Abdominal pain [ ] ; Constipation [ ] ; Diarrhea [ ] ; BRBPR [ ]     GU: Hematuria [ ] ; Dysuria [ ] ; Nocturia [ ]   Vascular: Pain in legs with walking [ ] ; Pain in feet with lying flat [ ] ; Non-healing sores [ ] ; Stroke [ ] ; TIA [ ] ; Slurred speech [ ] ;    Neuro: Headaches [ ] ; Vertigo [ ] ; Seizures [ ] ; Paresthesias [ ] ;Blurred vision [ ] ; Diplopia [ ] ; Vision changes [ ]     Ortho/Skin: Arthritis [ ] ; Joint pain [ ] ; Muscle pain [ ] ; Joint swelling [ ] ; Back Pain [ ] ; Rash [ ]     Psych: Depression [ ] ; Anxiety [ ]     Heme: Bleeding problems [ ] ; Clotting disorders [ ] ; Anemia [ ]     Endocrine: Diabetes [ ] ; Thyroid dysfunction [ ]    Physical Exam/Data:   Vitals:   09/23/20 2100 09/23/20 2115 09/23/20 2145 09/23/20 2200  BP: (!) 120/50 (!) 127/52 (!) 133/48 (!) 129/56  Pulse: 88 87 88 88  Resp: 19 17 15 16   Temp:      TempSrc:      SpO2: 100% 100% 100% 100%  Weight:      Height:       No intake or output data in the 24 hours ending 09/23/20 2247 Last 3 Weights 09/23/2020 07/03/2020 05/17/2020  Weight (lbs) 170 lb 165 lb 170 lb  Weight (kg) 77.111 kg 74.844 kg 77.111 kg     Body mass index is 29.18 kg/m.  General:  Well nourished, well developed, in no acute distress HEENT: normal Lymph: no adenopathy Neck: no JVD Endocrine:  No thryomegaly Vascular: No carotid bruits; FA pulses 2+ bilaterally without bruits  Cardiac:  normal S1, S2; RRR; no murmur  Lungs:  clear to auscultation bilaterally, no wheezing, rhonchi or rales  Abd: soft, nontender, no  hepatomegaly  Ext: no edema, LUE fistula/graft with bruit, RUE fistula with bruit  Musculoskeletal:  No deformities, BUE and BLE strength normal and equal Skin: warm and dry  Neuro:  CNs 2-12 intact, no focal abnormalities noted Psych:  Normal affect   EKG:  The EKG was personally reviewed and demonstrates: NSR/RBBB, no dynamic changes  Relevant CV Studies: TTE Result date: 05/31/20 1. Left ventricular ejection fraction, by estimation, is 50 to 55%. The  left ventricle has low normal function. The left ventricle demonstrates  regional wall motion abnormalities (see scoring diagram/findings  for  description). Left ventricular diastolic  parameters are consistent with Grade I diastolic dysfunction (impaired  relaxation). Elevated left ventricular end-diastolic pressure. There is  severe akinesis of the left ventricular, basal-mid inferior wall and  inferolateral wall.  2. Right ventricular systolic function is normal. The right ventricular  size is normal.  3. Small PFO suspected by color doppler -consider bubble study if  indicated. Evidence of atrial level shunting detected by color flow  Doppler.  4. Moderate mitral subvalvular calcification.  5. The mitral valve is abnormal. Mild mitral valve regurgitation.  6. The aortic valve is tricuspid. Aortic valve regurgitation is not  visualized.  7. The inferior vena cava is normal in size with greater than 50%  respiratory variability, suggesting right atrial pressure of 3 mmHg.   Laboratory Data:  High Sensitivity Troponin:   Recent Labs  Lab 09/23/20 1937  TROPONINIHS 20*     Chemistry Recent Labs  Lab 09/23/20 1937  NA 137  K 3.3*  CL 93*  CO2 34*  GLUCOSE 200*  BUN 7*  CREATININE 2.74*  CALCIUM 7.7*  GFRNONAA 17*  ANIONGAP 10    Recent Labs  Lab 09/23/20 1937  PROT 6.2*  ALBUMIN 3.0*  AST 43*  ALT 27  ALKPHOS 75  BILITOT 0.6   Hematology Recent Labs  Lab 09/23/20 1937  WBC 5.1  RBC 3.25*   HGB 11.0*  HCT 34.5*  MCV 106.2*  MCH 33.8  MCHC 31.9  RDW 15.0  PLT 129*   BNPNo results for input(s): BNP, PROBNP in the last 168 hours.  DDimer No results for input(s): DDIMER in the last 168 hours.  Radiology/Studies:  CT Head Wo Contrast  Result Date: 09/23/2020 CLINICAL DATA:  Encephalopathy EXAM: CT HEAD WITHOUT CONTRAST TECHNIQUE: Contiguous axial images were obtained from the base of the skull through the vertex without intravenous contrast. COMPARISON:  None. FINDINGS: Brain: There is no mass, hemorrhage or extra-axial collection. The size and configuration of the ventricles and extra-axial CSF spaces are normal. There is hypoattenuation of the white matter, most commonly indicating chronic small vessel disease. Vascular: Atherosclerotic calcification of the internal carotid arteries at the skull base. No abnormal hyperdensity of the major intracranial arteries or dural venous sinuses. Skull: The visualized skull base, calvarium and extracranial soft tissues are normal. Sinuses/Orbits: Right maxillary sinus mucosal thickening. The orbits are normal. IMPRESSION: Chronic small vessel disease without acute intracranial abnormality. Electronically Signed   By: Ulyses Jarred M.D.   On: 09/23/2020 20:47   DG Chest Port 1 View  Result Date: 09/23/2020 CLINICAL DATA:  Ventricular tachycardia, cardiac arrest status post resuscitation EXAM: PORTABLE CHEST 1 VIEW COMPARISON:  04/10/2018 FINDINGS: Single frontal view of the chest demonstrates stable enlargement the cardiac silhouette, with postsurgical changes from median sternotomy and bypass surgery. Retrocardiac consolidation may reflect atelectasis, infection, or aspiration. Small left pleural effusion. No pneumothorax. No acute displaced fractures. Vascular stent left axillary region. IMPRESSION: 1. Left lower lobe consolidation may reflect atelectasis, infection, or aspiration. 2. Small left pleural effusion. Electronically Signed   By:  Randa Ngo M.D.   On: 09/23/2020 19:21   Assessment and Plan:   71F with CAD s/p CABG x3 (LIMA-LAD, SVG-diag, SVG-RCA), prior VF arrest in the setting of SVG occlusion , ESRD on iHD, post op AF, TIA, CAS s/p L CAE (2009), MDD, DM2, diverticulosis, GERD, glaucoma, and HTN who presents following recurrent LOC and subsequent witnessed arrest.  She had multiple episodes of presyncope with prodromal symptoms  along with two episodes of acute loss of consciousness requiring CPR.  Her story along with EMS report of a wide-complex tachycardia with a rate over 200 is concerning for ventricular tachycardia as the etiology of her presentation.  She has history of VT in the setting of occluded SVG requiring PCI.  She had no preceding symptoms of chest pain/pressure during her cardiac arrest and 2019 which was attributed to an occluded SVG to PDA.  She also had recurrent VT following reperfusion during that hospitalization.  She was not a candidate for ICD therapy at that time as it was thought that this was an obvious reversible cause with subsequent VT in the setting of reperfusion within 48 hours of revascularization.  Unfortunately we do not have rhythm strips from her episode with EMS on transport but do have events reports of WCT >200 with recurrent symptoms.  On arrival she was in bigeminy during my evaluation was back to normal sinus rhythm with a RBBB.  In 2019 she had a significant troponin elevation (trop I/regular 1.17->1.14) without significant delta.  Surprisingly despite at least 5 to 10 minutes of CPR she did not have as high troponin elevation during her ED evaluation today (hsT 20->30).  Given her significant coronary history I think we should reevaluate her coronary anatomy since there is a high suspicion for recurrent ventricular arrhythmia.  During her last presentation she does not remember any preceding chest pain or similar symptoms like she had today but had abrupt loss of consciousness and  subsequent arrest witnessed by family.  She had mild chest tightness prior to dialysis but said she has had this before and it was very mild.  Management of possible ventricular arrhythmia is further complicated by the fact that she was a dialysis patient in the past 3 years and has a failing dialysis graft on her left upper extremity and a recently created fistula over her right upper extremity.  She is right-hand dominant and has had no prior clavicular fractures or prior surgeries.  We will discuss her case with our EP colleagues whether she will be a candidate for SQ ICD if coronary evaluation did not reveal obstructive disease.  She was started on amiodarone on admission and I think it is reasonable to continue her on a 10 g p.o. equivalent amiodarone load while we monitor her for recurrent ventricular arrhythmias.  Her CMP was significant for mild hypokalemia (K 3.3) and hypoMg (1.7) although I don't think these alone caused her arrhythmia.  - will discuss her case with both IC and EP, likely coronary eval Monday and further discussion on whether we should proceed with 2' prevention SQ ICD based off WCT without recordings  - continue amio 10 gm PO amio load, LFTs mildly elevated in the setting of CPR however normal prior, added on TSH (last nl 2016)  - supplement K/Mg (3.3/1.7) - repeat TTE  For questions or updates, please contact [place] Please consult [email or web address] for contact info under   Signed, Dion Body, MD  09/23/2020 10:47 PM

## 2020-09-23 NOTE — ED Notes (Signed)
Pt to CT scan via stretcher.

## 2020-09-23 NOTE — Consult Note (Incomplete)
Cardiology Consultation:   Patient ID: [name] MRN: [id number]; DOB: 1940/09/01  Admit date: 09/23/2020 Date of Consult: 09/23/2020  Primary Care Provider: Josetta Huddle, MD [facility] Cardiologist: Debra Grooms, MD  [facility] Electrophysiologist:  None   Patient Profile:   [phone] with CAD s/p CABG x3 (LIMA-LAD, SVG-diag, SVG-RCA), prior VF arrest in the setting of SVG occlusion , ESRD on iHD, post op AF, TIA, CAS s/p L CAE (2009), MDD, DM2, diverticulosis, GERD, glaucoma, and HTN who presents following recurrent LOC and subsequent witnessed arrest.   History of Present Illness:   [name] was in her normal state of health this morning when she woke up and got the shower prior to dialysis.  In the shower she had a warm sensation over her face and felt that her thinking was "fuzzy" for a few seconds.  She denies any abrupt loss of consciousness and was sitting in her shower chair at the time.  Her symptoms only lasted for a few seconds and then she is back to her normal self.  She has had occasional falls in the past with most recent being over 6 months ago however denies any loss of consciousness or similar symptoms to those that she experienced today during his falls.  She usually can catch herself and has not fallen at least 6 months.  Past Medical History:  Diagnosis Date  . Anemia   . Anxiety   . Arthritis   . Atrial fibrillation ([place])   . Blood transfusion   . C. difficile diarrhea   . CAD (coronary artery disease)   . Carotid artery occlusion    Carotid Endartectom,y - left 2009.  Blockage Right being watched by Dr Scot Dock.  . Carotid stenosis   . 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 Complication of anesthesia    pt. states that she was difficult to wake  . Depression   . Diabetes mellitus without complication ([place])   . Diverticulosis   . Dysrhythmia   . General weakness 12/2015  . GERD (gastroesophageal reflux disease)   . Glaucoma    . History of hiatal hernia   . History of kidney stones    passed  . History of pneumonia   . HOH (hard of hearing)   . Hypertension   . Hypokalemia 12/2015  . Melanoma ([place])    .  top of head- melonoma  . Myocardial infarction ([place])   . Neuromuscular disorder ([place])    CARPEL TUNNEL  . Pneumonia   . SAH (subarachnoid hemorrhage) ([place]) 01/25/2017   bifrontal SAH following fall ([place])  . Shortness of breath   . 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 Stroke ([place])    hx of TIA    Past Surgical History:  Procedure Laterality Date  . A/V FISTULAGRAM N/A 07/21/2017   Procedure: A/V FISTULAGRAM - Left Arm;  Surgeon: Angelia Mould, MD;  Location: [place] [facility];  Service: Cardiovascular;  Laterality: N/A;  . A/V FISTULAGRAM Left 03/17/2020   Procedure: A/V Fistulagram;  Surgeon: Angelia Mould, MD;  Location: [place] [facility];  Service: Cardiovascular;  Laterality: Left;  arm  . AV FISTULA PLACEMENT Left 05/31/2014   Procedure: Creation of Left Arm arteriovenous brachiocephalic Fistula;  Surgeon: Angelia Mould, MD;  Location: [facility];  Service: Vascular;  Laterality: Left;  . AV FISTULA PLACEMENT Left 09/01/2014   Procedure: INSERTION OF ARTERIOVENOUS (AV) GORE-TEX  GRAFT LEFT UPPER ARM USING  4-7 MM X 45 CM SRTETCH GORETEX GRAFT;  Surgeon: Angelia Mould, MD;  Location: [place];  Service: Vascular;  Laterality: Left;  . BACK SURGERY    . BASCILIC VEIN TRANSPOSITION Right 07/03/2020   Procedure: BASCILIC VEIN TRANSPOSITION;  Surgeon: Angelia Mould, MD;  Location: [place];  Service: Vascular;  Laterality: Right;  . CARDIAC CATHETERIZATION    . CAROTID ENDARTERECTOMY Left 2009    CEA  . carpel tunnel    . COLONOSCOPY  07/25/2011   Procedure: COLONOSCOPY;  Surgeon: Winfield Cunas., MD;  Location[facility];  Service: Endoscopy;  Laterality: N/A;  . CORONARY ARTERY BYPASS GRAFT  07/29/2011   Procedure: CORONARY ARTERY BYPASS GRAFTING  (CABG);  Surgeon: Gaye Pollack, MD;  Location: [place];  Service: Open Heart Surgery;  Laterality: N/A;  . CORONARY STENT INTERVENTION N/A 04/08/2018   Procedure: CORONARY STENT INTERVENTION;  Surgeon: Jettie Booze, MD;  Location: [place] [facility];  Service: Cardiovascular;  Laterality: N/A;  . ENDARTERECTOMY Right 04/21/2015   Procedure: ENDARTERECTOMY CAROTID;  Surgeon: Angelia Mould, MD;  Location: [place];  Service: Vascular;  Laterality: Right;  . ESOPHAGOGASTRODUODENOSCOPY  07/25/2011   Procedure: ESOPHAGOGASTRODUODENOSCOPY (EGD);  Surgeon: Winfield Cunas., MD;  Location[facility];  Service: Endoscopy;  Laterality: N/A;  . EYE SURGERY    . FISTULOGRAM Left 08/29/2014   Procedure: FISTULOGRAM;  Surgeon: Angelia Mould, MD;  Location: [facility];  Service: Cardiovascular;  Laterality: Left;  . KNEE SURGERY Left   . LEFT HEART CATH AND CORS/GRAFTS ANGIOGRAPHY N/A 04/08/2018   Procedure: LEFT HEART CATH AND CORS/GRAFTS ANGIOGRAPHY;  Surgeon: Jettie Booze, MD;  Location: [place] [facility];  Service: Cardiovascular;  Laterality: N/A;  . LUMBAR LAMINECTOMY/DECOMPRESSION MICRODISCECTOMY N/A 12/01/2015   Procedure: L5-S1 Decompression and Bilateral Microdiscectomy;  Surgeon: Marybelle Killings, MD;  Location: [place];  Service: Orthopedics;  Laterality: N/A;  . NECK SURGERY    . PERIPHERAL VASCULAR BALLOON ANGIOPLASTY  07/21/2017   Procedure: PERIPHERAL VASCULAR BALLOON ANGIOPLASTY;  Surgeon: Angelia Mould, MD;  Location: [place] [facility];  Service: Cardiovascular;;  LT Arm AVF  . TONSILLECTOMY       {Home Medications (Optional):21181}  Inpatient Medications: Scheduled Meds:  Continuous Infusions: . sodium chloride    . amiodarone 60 mg/hr (09/23/20 2226)   Followed by  . [START ON 09/24/2020] amiodarone     PRN Meds: sodium chloride  Allergies:    Allergies  Allergen Reactions  . Neurontin [Gabapentin] Other (See Comments)    Hallucinations and  "Makes me go crazy"   . Sulfa Antibiotics Other (See Comments)    Altered mental state   . Norvasc [Amlodipine] Other (See Comments)    "Makes my legs swell"; edema    Social History:   Social History   Socioeconomic History  . Marital status: Married    Spouse name: Not on file  . Number of children: 2  . Years of education: Not on file  . Highest education level: Not on file  Occupational History  . Occupation: retired  Tobacco Use  . Smoking status: Never Smoker  . Smokeless tobacco: Never Used  Vaping Use  . Vaping Use: Never used  Substance and Sexual Activity  . Alcohol use: No    Alcohol/week: 0.0 standard drinks  . Drug use: No  . Sexual activity: Not Currently    Birth control/protection: Post-menopausal  Other Topics  Concern  . Not on file  Social History Narrative  . Not on file   Social Determinants of Health   Financial Resource Strain: Not on file  Food Insecurity: Not on file  Transportation Needs: Not on file  Physical Activity: Not on file  Stress: Not on file  Social Connections: Not on file  Intimate Partner Violence: Not on file    Family History:   *** Family History  Problem Relation Age of Onset  . Diabetes Mother   . Hypertension Mother   . Heart disease Mother        before age 31  . Heart attack Mother   . Stroke Mother   . Hyperlipidemia Father   . Hypertension Father   . Colon polyps Father   . Lung cancer Father   . Deep vein thrombosis Daughter   . Diabetes Daughter   . Hyperlipidemia Daughter   . Hypertension Daughter   . Heart disease Daughter   . Peripheral vascular disease Daughter   . Breast cancer Neg Hx      ROS:  Review of Systems: [y] = yes, [ ]  = no       General: Weight gain [ ] ; Weight loss [ ] ; Anorexia [ ] ; Fatigue [ ] ; Fever [ ] ; Chills [ ] ; Weakness [ ]     Cardiac: Chest pain/pressure [ ] ; Resting SOB [ ] ; Exertional SOB [ ] ; Orthopnea [ ] ; Pedal Edema [ ] ; Palpitations [ ] ; Syncope [ ] ; Presyncope  [ ] ; Paroxysmal nocturnal dyspnea [ ]     Pulmonary: Cough [ ] ; Wheezing [ ] ; Hemoptysis [ ] ; Sputum [ ] ; Snoring [ ]     GI: Vomiting [ ] ; Dysphagia [ ] ; Melena [ ] ; Hematochezia [ ] ; Heartburn [ ] ; Abdominal pain [ ] ; Constipation [ ] ; Diarrhea [ ] ; BRBPR [ ]     GU: Hematuria [ ] ; Dysuria [ ] ; Nocturia [ ]   Vascular: Pain in legs with walking [ ] ; Pain in feet with lying flat [ ] ; Non-healing sores [ ] ; Stroke [ ] ; TIA [ ] ; Slurred speech [ ] ;    Neuro: Headaches [ ] ; Vertigo [ ] ; Seizures [ ] ; Paresthesias [ ] ;Blurred vision [ ] ; Diplopia [ ] ; Vision changes [ ]     Ortho/Skin: Arthritis [ ] ; Joint pain [ ] ; Muscle pain [ ] ; Joint swelling [ ] ; Back Pain [ ] ; Rash [ ]     Psych: Depression [ ] ; Anxiety [ ]     Heme: Bleeding problems [ ] ; Clotting disorders [ ] ; Anemia [ ]     Endocrine: Diabetes [ ] ; Thyroid dysfunction [ ]    Physical Exam/Data:   Vitals:   09/23/20 2100 09/23/20 2115 09/23/20 2145 09/23/20 2200  BP: (!) 120/50 (!) 127/52 (!) 133/48 (!) 129/56  Pulse: 88 87 88 88  Resp: 19 17 15 16   Temp:      TempSrc:      SpO2: 100% 100% 100% 100%  Weight:      Height:       No intake or output data in the 24 hours ending 09/23/20 2247 Last 3 Weights 09/23/2020 07/03/2020 05/17/2020  Weight (lbs) 170 lb 165 lb 170 lb  Weight (kg) 77.111 kg 74.844 kg 77.111 kg     Body mass index is 29.18 kg/m.  General:  Well nourished, well developed, in no acute distress*** HEENT: normal Lymph: no adenopathy Neck: no JVD Endocrine:  No thryomegaly Vascular: No carotid bruits; FA pulses 2+ bilaterally without bruits  Cardiac:  normal S1, S2; RRR; no murmur *** Lungs:  clear to auscultation bilaterally, no wheezing, rhonchi or rales  Abd: soft, nontender, no hepatomegaly  Ext: no edema Musculoskeletal:  No deformities, BUE and BLE strength normal and equal Skin: warm and dry  Neuro:  CNs 2-12 intact, no focal abnormalities noted Psych:  Normal affect   EKG:  The EKG was personally  reviewed and demonstrates:  *** Telemetry:  Telemetry was personally reviewed and demonstrates:  ***  Relevant CV Studies: ***  Laboratory Data:  High Sensitivity Troponin:   Recent Labs  Lab 09/23/20 1937  TROPONINIHS 20*     Chemistry Recent Labs  Lab 09/23/20 1937  NA 137  K 3.3*  CL 93*  CO2 34*  GLUCOSE 200*  BUN 7*  CREATININE 2.74*  CALCIUM 7.7*  GFRNONAA 17*  ANIONGAP 10    Recent Labs  Lab 09/23/20 1937  PROT 6.2*  ALBUMIN 3.0*  AST 43*  ALT 27  ALKPHOS 75  BILITOT 0.6   Hematology Recent Labs  Lab 09/23/20 1937  WBC 5.1  RBC 3.25*  HGB 11.0*  HCT 34.5*  MCV 106.2*  MCH 33.8  MCHC 31.9  RDW 15.0  PLT 129*   BNPNo results for input(s): BNP, PROBNP in the last 168 hours.  DDimer No results for input(s): DDIMER in the last 168 hours.  Radiology/Studies:  CT Head Wo Contrast  Result Date: 09/23/2020 CLINICAL DATA:  Encephalopathy EXAM: CT HEAD WITHOUT CONTRAST TECHNIQUE: Contiguous axial images were obtained from the base of the skull through the vertex without intravenous contrast. COMPARISON:  None. FINDINGS: Brain: There is no mass, hemorrhage or extra-axial collection. The size and configuration of the ventricles and extra-axial CSF spaces are normal. There is hypoattenuation of the white matter, most commonly indicating chronic small vessel disease. Vascular: Atherosclerotic calcification of the internal carotid arteries at the skull base. No abnormal hyperdensity of the major intracranial arteries or dural venous sinuses. Skull: The visualized skull base, calvarium and extracranial soft tissues are normal. Sinuses/Orbits: Right maxillary sinus mucosal thickening. The orbits are normal. IMPRESSION: Chronic small vessel disease without acute intracranial abnormality. Electronically Signed   By: Ulyses Jarred M.D.   On: 09/23/2020 20:47   DG Chest Port 1 View  Result Date: 09/23/2020 CLINICAL DATA:  Ventricular tachycardia, cardiac arrest status  post resuscitation EXAM: PORTABLE CHEST 1 VIEW COMPARISON:  04/10/2018 FINDINGS: Single frontal view of the chest demonstrates stable enlargement the cardiac silhouette, with postsurgical changes from median sternotomy and bypass surgery. Retrocardiac consolidation may reflect atelectasis, infection, or aspiration. Small left pleural effusion. No pneumothorax. No acute displaced fractures. Vascular stent left axillary region. IMPRESSION: 1. Left lower lobe consolidation may reflect atelectasis, infection, or aspiration. 2. Small left pleural effusion. Electronically Signed   By: Randa Ngo M.D.   On: 09/23/2020 19:21   { If the patient is being seen for chest pain, [place], NSTEMI or STEMI Press F2 to calculate a risk score         :[id number]}   {Chest Pain/ACS Risk Score        :[id number]}   Assessment and Plan:   1. ***  {Are we signing off today?:[id number]}  For questions or updates, please contact [place] Please consult [email or web address] for contact info under    Signed, Dion Body, MD  09/23/2020 10:47 PM

## 2020-09-23 NOTE — ED Provider Notes (Signed)
[facility] EMERGENCY DEPARTMENT Provider Note   CSN: [id number] Arrival date & time: 09/23/20  1846     History Chief Complaint  Patient presents with  . Seizures    [name] is a 80 y.o. female.  The history is provided by the patient, medical records and the EMS personnel.  Seizures  [name] is a 80 y.o. female who presents to the Emergency Department complaining of possible seizure. Level V caveat due to confusion. History is provided by EMS and the patient. EMS was called out for possible seizure. Per report patient became unresponsive with shaking activity. She was found to be not breathing and had blood in her mouth. On EMS arrival she was breathing spontaneously with bigeminy on their monitor. During transfer to the emergency department she had an episode of unresponsiveness with V tach on the monitor, heart rates in the 200s. They report associated posturing. They were unable to obtain a 12 lead of the V tach due to needing to place AED. Episode spontaneously terminated. She was treated with lidocaine drip via I/O.  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    Past Medical History:  Diagnosis Date  . Anemia   . Anxiety   . Arthritis   . Atrial fibrillation ([place])   . Blood transfusion   . C. difficile diarrhea   . CAD (coronary artery disease)   . Carotid artery occlusion    Carotid Endartectom,y - left 2009.  Blockage Right being watched by Dr Scot Dock.  . Carotid stenosis   . 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 Complication of anesthesia    pt. states that she was difficult to wake  . Depression   . Diabetes mellitus without complication ([place])   . Diverticulosis   . Dysrhythmia   . General weakness 12/2015  . GERD (gastroesophageal reflux disease)   . Glaucoma   . History of hiatal hernia   . History of kidney stones    passed  . History of pneumonia   . HOH (hard of hearing)   . Hypertension    . Hypokalemia 12/2015  . Melanoma ([place])    .  top of head- melonoma  . Myocardial infarction ([place])   . Neuromuscular disorder ([place])    CARPEL TUNNEL  . Pneumonia   . SAH (subarachnoid hemorrhage) ([place]) 01/25/2017   bifrontal SAH following fall ([place])  . Shortness of breath   . 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 Stroke ([place])    hx of TIA    Patient Active Problem List   Diagnosis Date Noted  . Syncope and collapse 09/23/2020  . Body mass index (BMI) 32.0-32.9, adult 08/01/2020  . Cervical disc disorder 08/01/2020  . Cough variant asthma 08/01/2020  . Dependence on renal dialysis ([place]) 08/01/2020  . Diabetic renal disease ([place]) 08/01/2020  . Diabetic retinopathy associated with type 2 diabetes mellitus ([place]) 08/01/2020  . Edema 08/01/2020  . Encounter for general adult medical examination without abnormal findings 08/01/2020  . Gout 08/01/2020  . Hereditary and idiopathic neuropathy, unspecified 08/01/2020  . Hyperglycemia due to type 2 diabetes mellitus ([place]) 08/01/2020  . Joint pain 08/01/2020  . 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 Osteoarthritis of knee 08/01/2020  . Other long term (current) drug therapy 08/01/2020  . Other polyosteoarthritis 08/01/2020  . Pain in unspecified knee 08/01/2020  . Proteinuria 08/01/2020  .  Pure hypercholesterolemia 08/01/2020  . Pure hypertriglyceridemia 08/01/2020  . Urinary tract infectious disease 08/01/2020  . Vitamin D deficiency 08/01/2020  . Vomiting 08/01/2020  . Other disorders of phosphorus metabolism 01/11/2020  . Encounter for removal of sutures 12/04/2019  . Shortness of breath 07/29/2019  . Anaphylactic shock, unspecified, sequela 04/23/2019  . Secondary hyperparathyroidism of renal origin ([place]) 09/21/2018  . Iron deficiency anemia, unspecified 06/08/2018  . Hemorrhoids   . Diabetes mellitus type 2 in nonobese (HCC)   . Anemia, chronic disease   . Labile blood pressure   . Poorly controlled  type 2 diabetes mellitus with peripheral neuropathy ([place])   . Debility   . Benign essential HTN   . Bradycardia   . Nonsustained ventricular tachycardia ([place])   . Enteritis due to Clostridium difficile   . ESRD on dialysis ([place])   . Diabetic peripheral neuropathy ([place])   . Acute blood loss anemia   . Anemia of chronic disease   . Aspiration into airway   . NSTEMI (non-ST elevated myocardial infarction) ([place])   . QT prolongation 04/03/2018  . Acute respiratory failure with hypoxia ([place])   . Aspiration pneumonia due to gastric secretions ([place])   . Cardiac arrest ([place]) 04/02/2018  . Mild protein-calorie malnutrition ([place]) 03/13/2018  . Anxiety 05/13/2017  . Panic attack 05/13/2017  . SAH (subarachnoid hemorrhage) ([place]) 02/03/2017  . Fall 02/03/2017  . Spinal stenosis, lumbar region 03/02/2016  . Trochanteric bursitis of left hip 01/03/2016  . Generalized weakness 12/21/2015  . Hypokalemia 12/21/2015  . Lumbar back pain with radiculopathy affecting left lower extremity 12/04/2015  . Coronary artery disease involving native coronary artery of native heart without angina pectoris   . CRI (chronic renal insufficiency), stage 4 (severe) (HCC)   . Urinary retention   . Anemia of chronic kidney failure   . Hx of gout   . Thrombocytopenia ([place])   . S/P lumbar discectomy 12/01/2015  . 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 Acute encephalopathy   . Chronic kidney disease (CKD), stage V ([place]) 07/13/2015  . Glaucoma   . Atrial fibrillation ([place])   . CAD (coronary artery disease)   . Depression   . Stroke ([place])   . Diabetes mellitus without complication ([place])   . Gastroesophageal reflux disease with esophagitis   . Carotid artery stenosis 04/21/2015  . Essential hypertension 06/08/2014  . Hyperlipidemia 06/08/2014  . Occlusion and stenosis of carotid artery without mention of cerebral infarction 01/01/2012     Past Surgical History:  Procedure Laterality Date  . A/V FISTULAGRAM N/A 07/21/2017   Procedure: A/V FISTULAGRAM - Left Arm;  Surgeon: Angelia Mould, MD;  Location: [facility];  Service: Cardiovascular;  Laterality: N/A;  . A/V FISTULAGRAM Left 03/17/2020   Procedure: A/V Fistulagram;  Surgeon: Angelia Mould, MD;  Location: [facility];  Service: Cardiovascular;  Laterality: Left;  arm  . AV FISTULA PLACEMENT Left 05/31/2014   Procedure: Creation of Left Arm arteriovenous brachiocephalic Fistula;  Surgeon: Angelia Mould, MD;  Location: [facility];  Service: Vascular;  Laterality: Left;  . AV FISTULA PLACEMENT Left 09/01/2014   Procedure: INSERTION OF ARTERIOVENOUS (AV) GORE-TEX GRAFT LEFT UPPER ARM USING  4-7 MM X 45 CM SRTETCH GORETEX GRAFT;  Surgeon: Angelia Mould, MD;  Location: [facility];  Service: Vascular;  Laterality: Left;  . BACK SURGERY    . BASCILIC VEIN TRANSPOSITION Right 07/03/2020  Procedure: BASCILIC VEIN TRANSPOSITION;  Surgeon: Angelia Mould, MD;  Location: [facility];  Service: Vascular;  Laterality: Right;  . CARDIAC CATHETERIZATION    . CAROTID ENDARTERECTOMY Left 2009    CEA  . carpel tunnel    . COLONOSCOPY  07/25/2011   Procedure: COLONOSCOPY;  Surgeon: Winfield Cunas., MD;  Location: [facility] ENDOSCOPY;  Service: Endoscopy;  Laterality: N/A;  . CORONARY ARTERY BYPASS GRAFT  07/29/2011   Procedure: CORONARY ARTERY BYPASS GRAFTING (CABG);  Surgeon: Gaye Pollack, MD;  Location: [facility];  Service: Open Heart Surgery;  Laterality: N/A;  . CORONARY STENT INTERVENTION N/A 04/08/2018   Procedure: CORONARY STENT INTERVENTION;  Surgeon: Jettie Booze, MD;  Location: [facility];  Service: Cardiovascular;  Laterality: N/A;  . ENDARTERECTOMY Right 04/21/2015   Procedure: ENDARTERECTOMY CAROTID;  Surgeon: Angelia Mould, MD;  Location: [facility];  Service: Vascular;  Laterality: Right;  . ESOPHAGOGASTRODUODENOSCOPY   07/25/2011   Procedure: ESOPHAGOGASTRODUODENOSCOPY (EGD);  Surgeon: Winfield Cunas., MD;  Location: [facility] ENDOSCOPY;  Service: Endoscopy;  Laterality: N/A;  . EYE SURGERY    . FISTULOGRAM Left 08/29/2014   Procedure: FISTULOGRAM;  Surgeon: Angelia Mould, MD;  Location: [facility] CATH LAB;  Service: Cardiovascular;  Laterality: Left;  . KNEE SURGERY Left   . LEFT HEART CATH AND CORS/GRAFTS ANGIOGRAPHY N/A 04/08/2018   Procedure: LEFT HEART CATH AND CORS/GRAFTS ANGIOGRAPHY;  Surgeon: Jettie Booze, MD;  Location: [facility];  Service: Cardiovascular;  Laterality: N/A;  . LUMBAR LAMINECTOMY/DECOMPRESSION MICRODISCECTOMY N/A 12/01/2015   Procedure: L5-S1 Decompression and Bilateral Microdiscectomy;  Surgeon: Marybelle Killings, MD;  Location: [facility];  Service: Orthopedics;  Laterality: N/A;  . NECK SURGERY    . PERIPHERAL VASCULAR BALLOON ANGIOPLASTY  07/21/2017   Procedure: PERIPHERAL VASCULAR BALLOON ANGIOPLASTY;  Surgeon: Angelia Mould, MD;  Location: [facility] LAB;  Service: Cardiovascular;;  LT Arm AVF  . TONSILLECTOMY       OB History   No obstetric history on file.     Family History  Problem Relation Age of Onset  . Diabetes Mother   . Hypertension Mother   . Heart disease Mother        before age 83  . Heart attack Mother   . Stroke Mother   . Hyperlipidemia Father   . Hypertension Father   . Colon polyps Father   . Lung cancer Father   . Deep vein thrombosis Daughter   . Diabetes Daughter   . Hyperlipidemia Daughter   . Hypertension Daughter   . Heart disease Daughter   . Peripheral vascular disease Daughter   . Breast cancer Neg Hx     Social History   Tobacco Use  . Smoking status: Never Smoker  . Smokeless tobacco: Never Used  Vaping Use  . Vaping Use: Never used  Substance Use Topics  . Alcohol use: No    Alcohol/week: 0.0 standard drinks  . Drug use: No    Home Medications Prior to Admission medications   Medication Sig Start Date  End Date Taking? Authorizing Provider  acetaminophen (TYLENOL) 325 MG tablet Take 325-650 mg by mouth 3 (three) times daily as needed for headache (pain).   Yes [provider]  allopurinol (ZYLOPRIM) 100 MG tablet Take 100 mg by mouth in the morning.   Yes [provider]  atorvastatin (LIPITOR) 40 MG tablet Take 1 tablet (40 mg total) by mouth daily at 6 PM. Patient  taking differently: Take 40 mg by mouth at bedtime. 04/28/18  Yes Angiulli, Lavon Paganini, PA-C  b complex vitamins capsule Take 1 capsule by mouth at bedtime.   Yes [provider]  brimonidine (ALPHAGAN P) 0.1 % SOLN Place 1-2 drops into both eyes See admin instructions. Instill one drop into right eye daily at bedtime, instill two drops into left eye daily at bedtime   Yes [provider]  Calcium Carbonate Antacid (TUMS PO) Take 1 tablet by mouth daily with lunch.   Yes [provider]  Calcium Polycarbophil (FIBER-LAX PO) Take 1 tablet by mouth 2 (two) times daily.   Yes [provider]  Cholecalciferol (VITAMIN D) 2000 UNITS tablet Take 2,000 Units by mouth 2 (two) times daily.   Yes [provider]  cinacalcet (SENSIPAR) 30 MG tablet Take 30 mg by mouth See admin instructions. Take one tablet (30 mg) by mouth on Tuesday and Thursday nights 02/14/20  Yes [provider]  citalopram (CELEXA) 20 MG tablet Take 20 mg by mouth in the morning. 02/08/20  Yes [provider]  clopidogrel (PLAVIX) 75 MG tablet Take 1 tablet (75 mg total) by mouth daily. Patient taking differently: Take 75 mg by mouth in the morning. 05/08/20  Yes Jettie Booze, MD  Cyanocobalamin (VITAMIN B-12) 1000 MCG SUBL Place 1,000 mcg under the tongue in the morning.   Yes [provider]  famotidine (PEPCID) 20 MG tablet Take 20 mg by mouth daily as needed for heartburn or indigestion.   Yes [provider]  hydrALAZINE (APRESOLINE) 25 MG tablet TAKE 1 TABLET BY MOUTH  TWICE DAILY. ON DAYS OF DIALYSIS TAKE 1 TABLET BY MOUTH IN THE EVENING Patient taking differently: Take 25 mg by mouth See admin instructions. Take one tablet (25 mg) by mouth on Sunday, Monday, Wednesday, Friday mornings (non-dialysis days); take one tablet (25 mg) by mouth daily at bedtime 03/06/20  Yes Jettie Booze, MD  insulin aspart (NOVOLOG FLEXPEN) 100 UNIT/ML FlexPen Inject 10-20 Units into the skin at bedtime as needed for high blood sugar (CBG >150).   Yes [provider]  lidocaine-prilocaine (EMLA) cream Apply 1 application topically daily as needed (one hour prior to dialysis). 08/10/20  Yes [provider]  multivitamin (RENA-VIT) TABS tablet Take 1 tablet by mouth at bedtime.   Yes [provider]  nitroGLYCERIN (NITROSTAT) 0.4 MG SL tablet Place 1 tablet (0.4 mg total) under the tongue every 5 (five) minutes as needed for chest pain. 05/08/20  Yes Jettie Booze, MD  Omega-3 Fatty Acids (FISH OIL) 1200 MG CAPS Take 1,200 mg by mouth 2 (two) times daily.   Yes [provider]  Probiotic Product (PROBIOTIC DAILY PO) Take 1 tablet by mouth every evening.   Yes [provider]  sevelamer carbonate (RENVELA) 800 MG tablet Take 1 tablet (800 mg total) by mouth 3 (three) times daily with meals. Patient taking differently: Take 1,600 mg by mouth 2 (two) times daily with a meal. 04/28/18  Yes Angiulli, Lavon Paganini, PA-C  timolol (TIMOPTIC) 0.5 % ophthalmic solution Place 1 drop into the left eye at bedtime.   Yes [provider]  Travoprost, BAK Free, (TRAVATAN) 0.004 % SOLN ophthalmic solution Place 1 drop into both eyes at bedtime. 09/09/20  Yes [provider]  Insulin Glargine (LANTUS) 100 UNIT/ML Solostar Pen Inject 15 Units into the skin daily. Patient not taking: No sig reported 04/28/18   Angiulli, Lavon Paganini, PA-C  Insulin  Pen Needle (PEN NEEDLES) 31G X 8 MM MISC 15 Units by Does not apply route daily. 04/30/18    Meredith Staggers, MD    Allergies    Neurontin [gabapentin], Sulfa antibiotics, and Norvasc [amlodipine]  Review of Systems   Review of Systems  Neurological: Positive for seizures.  All other systems reviewed and are negative.   Physical Exam Updated Vital Signs BP (!) 129/56   Pulse 88   Temp 98.2 F (36.8 C) (Oral)   Resp 16   Ht 5' 4"  (1.626 m)   Wt 77.1 kg   SpO2 100%   BMI 29.18 kg/m   Physical Exam Vitals and nursing note reviewed.  Constitutional:      Appearance: She is well-developed.  HENT:     Head: Normocephalic and atraumatic.  Cardiovascular:     Rate and Rhythm: Normal rate and regular rhythm.     Heart sounds: No murmur heard.   Pulmonary:     Effort: Pulmonary effort is normal. No respiratory distress.     Breath sounds: Normal breath sounds.  Abdominal:     Palpations: Abdomen is soft.     Tenderness: There is no abdominal tenderness. There is no guarding or rebound.  Musculoskeletal:        General: No tenderness.     Comments: IO in the left anterior tibia. Trace edema to bilateral lower extremities. Fistula in the left upper extremity with palpable thrill.  Skin:    General: Skin is warm and dry.  Neurological:     Mental Status: She is alert and oriented to person, place, and time.  Psychiatric:        Behavior: Behavior normal.     ED Results / Procedures / Treatments   Labs (all labs ordered are listed, but only abnormal results are displayed) Labs Reviewed  COMPREHENSIVE METABOLIC PANEL - Abnormal; Notable for the following components:      Result Value   Potassium 3.3 (*)    Chloride 93 (*)    CO2 34 (*)    Glucose, Bld 200 (*)    BUN 7 (*)    Creatinine, Ser 2.74 (*)    Calcium 7.7 (*)    Total Protein 6.2 (*)    Albumin 3.0 (*)    AST 43 (*)    GFR, Estimated 17 (*)    All other components within normal limits  CBC WITH DIFFERENTIAL/PLATELET - Abnormal; Notable for the following components:   RBC 3.25 (*)     Hemoglobin 11.0 (*)    HCT 34.5 (*)    MCV 106.2 (*)    Platelets 129 (*)    All other components within normal limits  PHOSPHORUS - Abnormal; Notable for the following components:   Phosphorus 2.2 (*)    All other components within normal limits  TROPONIN I (HIGH SENSITIVITY) - Abnormal; Notable for the following components:   Troponin I (High Sensitivity) 20 (*)    All other components within normal limits  RESP PANEL BY RT-PCR (FLU A&B, COVID) ARPGX2  PROTIME-INR  MAGNESIUM  TROPONIN I (HIGH SENSITIVITY)    EKG None  Radiology CT Head Wo Contrast  Result Date: 09/23/2020 CLINICAL DATA:  Encephalopathy EXAM: CT HEAD WITHOUT CONTRAST TECHNIQUE: Contiguous axial images were obtained from the base of the skull through the vertex without intravenous contrast. COMPARISON:  None. FINDINGS: Brain: There is no mass, hemorrhage or extra-axial collection. The size and configuration of the ventricles and extra-axial CSF spaces are normal.  There is hypoattenuation of the white matter, most commonly indicating chronic small vessel disease. Vascular: Atherosclerotic calcification of the internal carotid arteries at the skull base. No abnormal hyperdensity of the major intracranial arteries or dural venous sinuses. Skull: The visualized skull base, calvarium and extracranial soft tissues are normal. Sinuses/Orbits: Right maxillary sinus mucosal thickening. The orbits are normal. IMPRESSION: Chronic small vessel disease without acute intracranial abnormality. Electronically Signed   By: Ulyses Jarred M.D.   On: 09/23/2020 20:47   DG Chest Port 1 View  Result Date: 09/23/2020 CLINICAL DATA:  Ventricular tachycardia, cardiac arrest status post resuscitation EXAM: PORTABLE CHEST 1 VIEW COMPARISON:  04/10/2018 FINDINGS: Single frontal view of the chest demonstrates stable enlargement the cardiac silhouette, with postsurgical changes from median sternotomy and bypass surgery. Retrocardiac consolidation may  reflect atelectasis, infection, or aspiration. Small left pleural effusion. No pneumothorax. No acute displaced fractures. Vascular stent left axillary region. IMPRESSION: 1. Left lower lobe consolidation may reflect atelectasis, infection, or aspiration. 2. Small left pleural effusion. Electronically Signed   By: Randa Ngo M.D.   On: 09/23/2020 19:21    Procedures Procedures  CRITICAL CARE Performed by: Quintella Reichert   Total critical care time: 35 minutes  Critical care time was exclusive of separately billable procedures and treating other patients.  Critical care was necessary to treat or prevent imminent or life-threatening deterioration.  Critical care was time spent personally by me on the following activities: development of treatment plan with patient and/or surrogate as well as nursing, discussions with consultants, evaluation of patient's response to treatment, examination of patient, obtaining history from patient or surrogate, ordering and performing treatments and interventions, ordering and review of laboratory studies, ordering and review of radiographic studies, pulse oximetry and re-evaluation of patient's condition.  Medications Ordered in ED Medications  amiodarone (NEXTERONE) 1.8 mg/mL load via infusion 150 mg (150 mg Intravenous Bolus from Bag 09/23/20 2227)    Followed by  amiodarone (NEXTERONE PREMIX) 360-4.14 MG/200ML-% (1.8 mg/mL) IV infusion (60 mg/hr Intravenous New Bag/Given 09/23/20 2226)    Followed by  amiodarone (NEXTERONE PREMIX) 360-4.14 MG/200ML-% (1.8 mg/mL) IV infusion (has no administration in time range)    ED Course  I have reviewed the triage vital signs and the nursing notes.  Pertinent labs & imaging results that were available during my care of the patient were reviewed by me and considered in my medical decision making (see chart for details).    MDM Rules/Calculators/A&P                          Patient with ESR D on hemodialysis  here for evaluation following multiple witnesses syncopal events. EMS reports syncopal event with V tach on their monitor, unable to capture the rhythm. During ED stay patient has been asymptomatic, no recurrent ectopy or arrhythmia on telemetry. Given her history of V tach and concerning EMS report discussed with cardiologist, who recommends starting amio - will see the patient in consult. Patient and daughter updated findings of studies. Plan to admit for ongoing monitoring. Hospitalist consulted for admission. Final Clinical Impression(s) / ED Diagnoses Final diagnoses:  Syncope, unspecified syncope type  V-tach [facility])    Rx / [place] Orders ED Discharge Orders    None       Quintella Reichert, MD 09/23/20 2321

## 2020-09-24 ENCOUNTER — Inpatient Hospital Stay (HOSPITAL_COMMUNITY): Payer: Medicare Other

## 2020-09-24 ENCOUNTER — Inpatient Hospital Stay (HOSPITAL_COMMUNITY)
Admission: EM | Disposition: A | Discharge status: Hospice | Payer: Self-pay | Source: Home / Self Care | Attending: Family Medicine

## 2020-09-24 DIAGNOSIS — I472 Ventricular tachycardia: Principal | ICD-10-CM

## 2020-09-24 DIAGNOSIS — I4901 Ventricular fibrillation: Secondary | ICD-10-CM

## 2020-09-24 DIAGNOSIS — I10 Essential (primary) hypertension: Secondary | ICD-10-CM | POA: Diagnosis not present

## 2020-09-24 DIAGNOSIS — R55 Syncope and collapse: Secondary | ICD-10-CM | POA: Diagnosis not present

## 2020-09-24 DIAGNOSIS — I25119 Atherosclerotic heart disease of native coronary artery with unspecified angina pectoris: Secondary | ICD-10-CM

## 2020-09-24 DIAGNOSIS — I25709 Atherosclerosis of coronary artery bypass graft(s), unspecified, with unspecified angina pectoris: Secondary | ICD-10-CM | POA: Diagnosis not present

## 2020-09-24 DIAGNOSIS — I469 Cardiac arrest, cause unspecified: Secondary | ICD-10-CM

## 2020-09-24 DIAGNOSIS — I462 Cardiac arrest due to underlying cardiac condition: Secondary | ICD-10-CM | POA: Diagnosis not present

## 2020-09-24 HISTORY — PX: LEFT HEART CATH AND CORS/GRAFTS ANGIOGRAPHY: CATH118250

## 2020-09-24 LAB — CBC
HCT: 34.2 % — ABNORMAL LOW (ref 36.0–46.0)
Hemoglobin: 11 g/dL — ABNORMAL LOW (ref 12.0–15.0)
MCH: 34.1 pg — ABNORMAL HIGH (ref 26.0–34.0)
MCHC: 32.2 g/dL (ref 30.0–36.0)
MCV: 105.9 fL — ABNORMAL HIGH (ref 80.0–100.0)
Platelets: 123 10*3/uL — ABNORMAL LOW (ref 150–400)
RBC: 3.23 MIL/uL — ABNORMAL LOW (ref 3.87–5.11)
RDW: 15.1 % (ref 11.5–15.5)
WBC: 6.1 10*3/uL (ref 4.0–10.5)
nRBC: 0 % (ref 0.0–0.2)

## 2020-09-24 LAB — MAGNESIUM: Magnesium: 1.8 mg/dL (ref 1.7–2.4)

## 2020-09-24 LAB — TROPONIN I (HIGH SENSITIVITY)
Troponin I (High Sensitivity): 475 ng/L (ref <18)
Troponin I (High Sensitivity): 1923 ng/L (ref <18)

## 2020-09-24 LAB — BASIC METABOLIC PANEL
Anion gap: 11 (ref 5–15)
BUN: 12 mg/dL (ref 8–23)
CO2: 34 mmol/L — ABNORMAL HIGH (ref 22–32)
Calcium: 8.1 mg/dL — ABNORMAL LOW (ref 8.9–10.3)
Chloride: 94 mmol/L — ABNORMAL LOW (ref 98–111)
Creatinine, Ser: 3.62 mg/dL — ABNORMAL HIGH (ref 0.44–1.00)
GFR, Estimated: 12 mL/min — ABNORMAL LOW (ref >60)
Glucose, Bld: 157 mg/dL — ABNORMAL HIGH (ref 70–99)
Potassium: 3.7 mmol/L (ref 3.5–5.1)
Sodium: 139 mmol/L (ref 135–145)

## 2020-09-24 LAB — ECHOCARDIOGRAM COMPLETE
Area-P 1/2: 2.87 cm2
Height: 64 in
S' Lateral: 3.6 cm
Weight: 2720 oz

## 2020-09-24 LAB — HEMOGLOBIN A1C
Hgb A1c MFr Bld: 5 % (ref 4.8–5.6)
Mean Plasma Glucose: 96.8 mg/dL

## 2020-09-24 LAB — GLUCOSE, CAPILLARY
Glucose-Capillary: 134 mg/dL — ABNORMAL HIGH (ref 70–99)
Glucose-Capillary: 228 mg/dL — ABNORMAL HIGH (ref 70–99)
Glucose-Capillary: 222 mg/dL — ABNORMAL HIGH (ref 70–99)
Glucose-Capillary: 209 mg/dL — ABNORMAL HIGH (ref 70–99)

## 2020-09-24 LAB — CREATININE, SERUM
Creatinine, Ser: 3.09 mg/dL — ABNORMAL HIGH (ref 0.44–1.00)
GFR, Estimated: 15 mL/min — ABNORMAL LOW (ref >60)

## 2020-09-24 LAB — TSH: TSH: 2.44 u[IU]/mL (ref 0.350–4.500)

## 2020-09-24 LAB — MRSA PCR SCREENING: MRSA by PCR: NEGATIVE

## 2020-09-24 SURGERY — LEFT HEART CATH AND CORS/GRAFTS ANGIOGRAPHY
Anesthesia: LOCAL

## 2020-09-24 MED ORDER — BRIMONIDINE TARTRATE 0.2 % OP SOLN
1.0000 [drp] | Freq: Every day | OPHTHALMIC | Status: DC
  Administered 2020-09-24 – 2020-10-11 (×18): 1 [drp] via OPHTHALMIC
  Filled 2020-09-24: qty 5

## 2020-09-24 MED ORDER — SODIUM CHLORIDE 0.9% FLUSH
3.0000 mL | Freq: Two times a day (BID) | INTRAVENOUS | Status: DC
  Administered 2020-09-24 – 2020-10-01 (×9): 3 mL via INTRAVENOUS

## 2020-09-24 MED ORDER — INSULIN ASPART 100 UNIT/ML ~~LOC~~ SOLN
0.0000 [IU] | Freq: Three times a day (TID) | SUBCUTANEOUS | Status: DC
  Administered 2020-09-24 – 2020-09-25 (×2): 2 [IU] via SUBCUTANEOUS
  Administered 2020-09-25 – 2020-09-28 (×8): 1 [IU] via SUBCUTANEOUS
  Administered 2020-09-29 – 2020-10-01 (×6): 2 [IU] via SUBCUTANEOUS
  Administered 2020-10-01 – 2020-10-02 (×2): 1 [IU] via SUBCUTANEOUS
  Administered 2020-10-02: 2 [IU] via SUBCUTANEOUS
  Administered 2020-10-02 – 2020-10-05 (×6): 1 [IU] via SUBCUTANEOUS
  Administered 2020-10-05: 2 [IU] via SUBCUTANEOUS
  Administered 2020-10-05 – 2020-10-06 (×3): 1 [IU] via SUBCUTANEOUS

## 2020-09-24 MED ORDER — LIDOCAINE IN D5W 4-5 MG/ML-% IV SOLN
1.0000 mg/min | INTRAVENOUS | Status: DC
  Administered 2020-09-24: 1 mg/min via INTRAVENOUS
  Filled 2020-09-24: qty 500

## 2020-09-24 MED ORDER — SEVELAMER CARBONATE 800 MG PO TABS
1600.0000 mg | ORAL_TABLET | Freq: Two times a day (BID) | ORAL | Status: DC
  Administered 2020-09-24 – 2020-10-09 (×23): 1600 mg via ORAL
  Filled 2020-09-24 (×25): qty 2

## 2020-09-24 MED ORDER — PHENYLEPHRINE HCL-NACL 10-0.9 MG/250ML-% IV SOLN
25.0000 ug/min | INTRAVENOUS | Status: DC
  Filled 2020-09-24: qty 250

## 2020-09-24 MED ORDER — SODIUM CHLORIDE 0.9% FLUSH
3.0000 mL | Freq: Two times a day (BID) | INTRAVENOUS | Status: DC
  Administered 2020-09-24 – 2020-10-09 (×16): 3 mL via INTRAVENOUS

## 2020-09-24 MED ORDER — ATORVASTATIN CALCIUM 40 MG PO TABS
40.0000 mg | ORAL_TABLET | Freq: Every day | ORAL | Status: DC
  Administered 2020-09-24 – 2020-09-26 (×3): 40 mg via ORAL
  Filled 2020-09-24 (×3): qty 1

## 2020-09-24 MED ORDER — POTASSIUM CHLORIDE CRYS ER 20 MEQ PO TBCR
20.0000 meq | EXTENDED_RELEASE_TABLET | Freq: Once | ORAL | Status: AC
  Administered 2020-09-24: 20 meq via ORAL
  Filled 2020-09-24: qty 1

## 2020-09-24 MED ORDER — CHLORHEXIDINE GLUCONATE CLOTH 2 % EX PADS
6.0000 | MEDICATED_PAD | Freq: Every day | CUTANEOUS | Status: DC
  Administered 2020-09-24 – 2020-09-30 (×5): 6 via TOPICAL

## 2020-09-24 MED ORDER — CINACALCET HCL 30 MG PO TABS
30.0000 mg | ORAL_TABLET | ORAL | Status: DC
  Administered 2020-09-28: 30 mg via ORAL
  Filled 2020-09-24 (×2): qty 1

## 2020-09-24 MED ORDER — RISAQUAD PO CAPS
1.0000 | ORAL_CAPSULE | Freq: Every evening | ORAL | Status: DC
  Administered 2020-09-24: 1 via ORAL
  Filled 2020-09-24 (×2): qty 1

## 2020-09-24 MED ORDER — LIDOCAINE HCL (PF) 1 % IJ SOLN
INTRAMUSCULAR | Status: AC
  Filled 2020-09-24: qty 5

## 2020-09-24 MED ORDER — INSULIN ASPART 100 UNIT/ML ~~LOC~~ SOLN
0.0000 [IU] | Freq: Every day | SUBCUTANEOUS | Status: DC
  Administered 2020-09-24 – 2020-09-27 (×2): 2 [IU] via SUBCUTANEOUS
  Administered 2020-09-28: 3 [IU] via SUBCUTANEOUS

## 2020-09-24 MED ORDER — ALLOPURINOL 100 MG PO TABS: 100.0000 mg | ORAL_TABLET | Freq: Every morning | ORAL | Status: DC

## 2020-09-24 MED ORDER — LIDOCAINE-PRILOCAINE 2.5-2.5 % EX CREA: 1.0000 "application " | TOPICAL_CREAM | Freq: Every day | CUTANEOUS | Status: DC | PRN

## 2020-09-24 MED ORDER — FAMOTIDINE 20 MG PO TABS
20.0000 mg | ORAL_TABLET | Freq: Every day | ORAL | Status: DC | PRN
  Administered 2020-10-05: 20 mg via ORAL
  Filled 2020-09-24: qty 1

## 2020-09-24 MED ORDER — VITAMIN B-12 1000 MCG PO TABS: 1000.0000 ug | ORAL_TABLET | Freq: Every morning | ORAL | Status: DC

## 2020-09-24 MED ORDER — LATANOPROST 0.005 % OP SOLN
1.0000 [drp] | Freq: Every day | OPHTHALMIC | Status: DC
  Administered 2020-09-24 – 2020-10-11 (×17): 1 [drp] via OPHTHALMIC
  Filled 2020-09-24: qty 2.5

## 2020-09-24 MED ORDER — LIDOCAINE HCL (PF) 1 % IJ SOLN
INTRAMUSCULAR | Status: AC
  Filled 2020-09-24: qty 30

## 2020-09-24 MED ORDER — HEPARIN (PORCINE) IN NACL 1000-0.9 UT/500ML-% IV SOLN
INTRAVENOUS | Status: AC
  Filled 2020-09-24: qty 1500

## 2020-09-24 MED ORDER — HYDRALAZINE HCL 20 MG/ML IJ SOLN: 10.0000 mg | INTRAMUSCULAR | Status: AC | PRN

## 2020-09-24 MED ORDER — LIDOCAINE IN D5W 4-5 MG/ML-% IV SOLN: 1.0000 mg/min | INTRAVENOUS | Status: DC

## 2020-09-24 MED ORDER — SODIUM CHLORIDE 0.9 % IV SOLN: INTRAVENOUS | Status: DC

## 2020-09-24 MED ORDER — BRIMONIDINE TARTRATE 0.15 % OP SOLN: 1.0000 [drp] | OPHTHALMIC | Status: DC

## 2020-09-24 MED ORDER — LIDOCAINE BOLUS VIA INFUSION
100.0000 mg | Freq: Once | INTRAVENOUS | Status: DC
  Filled 2020-09-24: qty 100

## 2020-09-24 MED ORDER — SODIUM BICARBONATE 8.4 % IV SOLN
INTRAVENOUS | Status: AC
  Filled 2020-09-24: qty 50

## 2020-09-24 MED ORDER — INSULIN ASPART 100 UNIT/ML FLEXPEN: 10.0000 [IU] | PEN_INJECTOR | Freq: Every evening | SUBCUTANEOUS | Status: DC | PRN

## 2020-09-24 MED ORDER — BRIMONIDINE TARTRATE 0.2 % OP SOLN
2.0000 [drp] | Freq: Every day | OPHTHALMIC | Status: DC
  Administered 2020-09-24 – 2020-10-11 (×18): 2 [drp] via OPHTHALMIC

## 2020-09-24 MED ORDER — LIDOCAINE HCL (PF) 1 % IJ SOLN
INTRAMUSCULAR | Status: DC | PRN
  Administered 2020-09-24: 13 mL

## 2020-09-24 MED ORDER — ASPIRIN 81 MG PO CHEW
81.0000 mg | CHEWABLE_TABLET | ORAL | Status: AC
  Administered 2020-09-25: 81 mg via ORAL
  Filled 2020-09-24: qty 1

## 2020-09-24 MED ORDER — TIMOLOL MALEATE 0.5 % OP SOLN
1.0000 [drp] | Freq: Every day | OPHTHALMIC | Status: DC
  Administered 2020-09-24 – 2020-10-11 (×17): 1 [drp] via OPHTHALMIC
  Filled 2020-09-24: qty 5

## 2020-09-24 MED ORDER — SODIUM CHLORIDE 0.9 % IV SOLN: 250.0000 mL | INTRAVENOUS | Status: DC | PRN

## 2020-09-24 MED ORDER — SODIUM CHLORIDE 0.9% FLUSH
3.0000 mL | Freq: Two times a day (BID) | INTRAVENOUS | Status: DC
  Administered 2020-09-24 – 2020-09-26 (×4): 3 mL via INTRAVENOUS

## 2020-09-24 MED ORDER — HEPARIN SODIUM (PORCINE) 5000 UNIT/ML IJ SOLN
5000.0000 [IU] | Freq: Three times a day (TID) | INTRAMUSCULAR | Status: DC
  Administered 2020-09-24 – 2020-09-25 (×3): 5000 [IU] via SUBCUTANEOUS
  Filled 2020-09-24 (×4): qty 1

## 2020-09-24 MED ORDER — IOHEXOL 350 MG/ML SOLN
65.0000 mL | Freq: Once | INTRAVENOUS | Status: AC | PRN
  Administered 2020-09-24: 65 mL via INTRAVENOUS

## 2020-09-24 MED ORDER — SODIUM CHLORIDE 0.9% FLUSH
3.0000 mL | INTRAVENOUS | Status: DC | PRN
Start: 2020-09-24 — End: 2020-09-29

## 2020-09-24 MED ORDER — ACETAMINOPHEN 325 MG PO TABS
325.0000 mg | ORAL_TABLET | Freq: Three times a day (TID) | ORAL | Status: DC | PRN
Start: 2020-09-24 — End: 2020-09-25
  Administered 2020-09-24 – 2020-09-25 (×3): 650 mg via ORAL
  Filled 2020-09-24 (×3): qty 2

## 2020-09-24 MED ORDER — MAGNESIUM SULFATE 2 GM/50ML IV SOLN
2.0000 g | Freq: Once | INTRAVENOUS | Status: AC
  Administered 2020-09-24: 2 g via INTRAVENOUS
  Filled 2020-09-24: qty 50

## 2020-09-24 MED ORDER — IOHEXOL 350 MG/ML SOLN
INTRAVENOUS | Status: AC
  Filled 2020-09-24: qty 1

## 2020-09-24 MED ORDER — NITROGLYCERIN 1 MG/10 ML FOR IR/CATH LAB
INTRA_ARTERIAL | Status: AC
  Filled 2020-09-24: qty 10

## 2020-09-24 MED ORDER — LABETALOL HCL 5 MG/ML IV SOLN: 10.0000 mg | INTRAVENOUS | Status: AC | PRN

## 2020-09-24 MED ORDER — CLOPIDOGREL BISULFATE 75 MG PO TABS: 75.0000 mg | ORAL_TABLET | Freq: Every morning | ORAL | Status: DC

## 2020-09-24 MED ORDER — CALCIUM CARBONATE ANTACID 500 MG PO CHEW
1.0000 | CHEWABLE_TABLET | Freq: Every day | ORAL | Status: DC
  Administered 2020-09-25 – 2020-10-04 (×10): 200 mg via ORAL
  Filled 2020-09-24 (×11): qty 1

## 2020-09-24 MED ORDER — HEPARIN SODIUM (PORCINE) 1000 UNIT/ML IJ SOLN
INTRAMUSCULAR | Status: AC
  Filled 2020-09-24: qty 1

## 2020-09-24 MED ORDER — VERAPAMIL HCL 2.5 MG/ML IV SOLN
INTRAVENOUS | Status: AC
  Filled 2020-09-24: qty 2

## 2020-09-24 MED ORDER — SODIUM CHLORIDE 0.9% FLUSH
3.0000 mL | INTRAVENOUS | Status: DC | PRN
  Administered 2020-09-29: 3 mL via INTRAVENOUS

## 2020-09-24 MED ORDER — LIDOCAINE BOLUS VIA INFUSION
100.0000 mg | INTRAVENOUS | Status: DC
  Filled 2020-09-24: qty 100

## 2020-09-24 MED ORDER — HEPARIN (PORCINE) IN NACL 1000-0.9 UT/500ML-% IV SOLN
INTRAVENOUS | Status: DC | PRN
  Administered 2020-09-24 (×2): 500 mL

## 2020-09-24 MED ORDER — SODIUM CHLORIDE 0.9 % IV SOLN: 250.0000 mL | INTRAVENOUS | Status: DC

## 2020-09-24 MED ORDER — NITROGLYCERIN 0.4 MG SL SUBL: 0.4000 mg | SUBLINGUAL_TABLET | SUBLINGUAL | Status: DC | PRN

## 2020-09-24 MED ORDER — CITALOPRAM HYDROBROMIDE 20 MG PO TABS: 20.0000 mg | ORAL_TABLET | Freq: Every morning | ORAL | Status: DC

## 2020-09-24 MED ORDER — CALCIUM POLYCARBOPHIL 625 MG PO TABS
625.0000 mg | ORAL_TABLET | Freq: Two times a day (BID) | ORAL | Status: DC
  Administered 2020-09-24 – 2020-10-09 (×24): 625 mg via ORAL
  Filled 2020-09-24 (×33): qty 1

## 2020-09-24 MED ORDER — IOHEXOL 350 MG/ML SOLN
INTRAVENOUS | Status: DC | PRN
  Administered 2020-09-24: 100 mL

## 2020-09-24 SURGICAL SUPPLY — 15 items
CATH INFINITI 5 FR 3DRC (CATHETERS) ×2 IMPLANT
CATH INFINITI 5 FR IM (CATHETERS) ×2 IMPLANT
CATH INFINITI 5FR AL1 (CATHETERS) ×2 IMPLANT
CATH INFINITI 5FR MULTPACK ANG (CATHETERS) ×2 IMPLANT
CLOSURE MYNX CONTROL 6F/7F (Vascular Products) ×2 IMPLANT
GLIDESHEATH SLEND SS 6F .021 (SHEATH) ×2 IMPLANT
GUIDEWIRE INQWIRE 1.5J.035X260 (WIRE) ×1 IMPLANT
INQWIRE 1.5J .035X260CM (WIRE) ×2
KIT ENCORE 26 ADVANTAGE (KITS) ×2 IMPLANT
KIT HEART LEFT (KITS) ×2 IMPLANT
PACK CARDIAC CATHETERIZATION (CUSTOM PROCEDURE TRAY) ×2 IMPLANT
SHEATH PINNACLE 6F 10CM (SHEATH) ×2 IMPLANT
TRANSDUCER W/STOPCOCK (MISCELLANEOUS) ×2 IMPLANT
TUBING CIL FLEX 10 FLL-RA (TUBING) ×2 IMPLANT
WIRE EMERALD 3MM-J .035X150CM (WIRE) ×2 IMPLANT

## 2020-09-24 NOTE — Progress Notes (Signed)
Chaplain responded to Code Blue. Patient was revived and being transported to [facility] lab. Physician said that patient's husband was upstairs in [place] and their daughter was with him. Chaplain went up to visit with husband who had surgery for foot amputation and their daughter. Chaplain offered prayer for [name] and is available when needed.   09/24/20 1130  Clinical Encounter Type  Visited With Health care provider  Visit Type Code  Referral From Other (Comment)  Consult/Referral To Chaplain

## 2020-09-24 NOTE — Interval H&P Note (Signed)
History and Physical Interval Note:  09/24/2020 12:33 PM  [name]  has presented today for surgery, with the diagnosis of V Tach Arrest with known CAD  The various methods of treatment have been discussed with the patient and family. After consideration of risks, benefits and other options for treatment, the patient has consented to  Procedure(s): Coronary/Graft Acute MI Revascularization (N/A) LEFT HEART CATH AND CORONARY ANGIOGRAPHY (N/A)  PERCUTANEOUS CORONARY INTERVENTION   as a surgical intervention.  The patient's history has been reviewed, patient examined, no change in status, stable for surgery.  I have reviewed the patient's chart and labs.  Questions were answered to the patient's satisfaction.     [name]

## 2020-09-24 NOTE — Progress Notes (Signed)
[place] O2 removed s/t pt reporting she does not use at home.  O2 sat dropped to mid-low 80's with good pleth while resting on room air.  Replaced [place] O2 at 2L w/ sat rebounding to 100%. Pt reports she gets winded with activity at home.  Will continue to monitor.

## 2020-09-24 NOTE — Progress Notes (Addendum)
Echo technician called RN into pt's room because pt was less responsive and having odd heart rhythm on the monitor.  On arrival, pt was minimally responsive and having intermittent beats of VT with palpable pulse.  RR and MD were called while obtaining crash cart to pt's room.  Pads placed on and board placed under patient.  RR at bedside when patient subsequently lost her pulse, compressions were begun and Code Blue was called.  ROSC was achieved after 90 seconds and pt was transferred to 2H14.

## 2020-09-24 NOTE — H&P (View-Only) (Signed)
I was called emergently to the bedside at 11:34 AM for cardiac arrest with VT VF.  Patient was undergoing her echocardiogram when she was noted to have cardiac arrest with 3-4 beats of ventricular tachycardia quickly degenerating to ventricular fibrillation.  CPR was initiated due to pulseless VT, she received approximately 90 seconds of CPR.  No defibrillations were required no epinephrine required.   Patient has a history of coronary artery disease status post CABG with LIMA to LAD, SVG to diagonal and SVG to posterolateral artery in the right circulation.  Prior episode of VT VF in 2019 requiring SVG to PLA stent.  Presented yesterday to the hospital with witnessed arrest at home requiring CPR and wide-complex tachycardia in route to hospital not requiring defibrillation.  Self terminating.  She appears neurologically intact, responding appropriately and moving all extremities.  I immediately contacted the on-call interventional cardiologist for emergent cardiac catheterization with VT VF arrest likely ischemic in origin.  She is on IV amiodarone infusion rate increased to 60 mg/h, and she received a bolus of 300 mg of amiodarone during resuscitation.  Upon presentation to the ICU, continued ectopy noted, will initiate lidocaine with bolus and infusion for ischemic VT. Lidocaine bolus 100 mg, infusion rate 1 mg/min.  9 echo images obtained before arrest - LVEF 45% with akinesis of the inferolateral basal wall and hypokinesis of inferolateral mid wall.  No MR or AR on limited clips. MV appears degenerative with calcification.   Exam: Gen: alert, oriented CV: RR, normal rate, no murmurs post arrest.  Lungs: clear, no increased work of breathing. Extrem: warm.   CRITICAL CARE Performed by: Cherlynn Kaiser, MD   Total critical care time: 45 minutes   Critical care time was exclusive of separately billable procedures and treating other patients.   Critical care was necessary to treat or prevent  imminent or life-threatening deterioration.   Critical care was time spent personally by me (independent of APPs or residents) on the following activities: development of treatment plan with patient and/or surrogate as well as nursing, discussions with consultants, evaluation of patient's response to treatment, examination of patient, obtaining history from patient or surrogate, ordering and performing treatments and interventions, ordering and review of laboratory studies, ordering and review of radiographic studies, pulse oximetry and re-evaluation of patient's condition.

## 2020-09-24 NOTE — Progress Notes (Signed)
Dr Ellyn Hack notified of inability to draw ordered labs x 3 phlebotomists.  Orders received that MD will be over to place left femoral a line.

## 2020-09-24 NOTE — Plan of Care (Signed)

## 2020-09-24 NOTE — Code Documentation (Signed)
  Patient Name: [name]   MRN: [id number]   Date of Birth/ Sex: [birth year] , female      Admission Date: 09/23/2020  Attending Provider: Donne Hazel, MD  Primary Diagnosis: Syncope and collapse   Indication: Pt was in her usual state of health until this AM, when she was noted to be pulseless V tach. Code blue was subsequently called. At the time of arrival on scene, ACLS protocol was underway.   Technical Description:  - CPR performance duration:  1.5 minutes  - Was defibrillation or cardioversion used? No   - Was external pacer placed? No  - Was patient intubated pre/post CPR? No   Medications Administered: Y = Yes; Blank = No Amiodarone  Y  Atropine    Calcium    Epinephrine    Lidocaine    Magnesium    Norepinephrine    Phenylephrine    Sodium bicarbonate    Vasopressin     Post CPR evaluation:  - Final Status - Was patient successfully resuscitated ? Yes - What is current rhythm? Normal sinus rhythm - What is current hemodynamic status? stable  Miscellaneous Information:  - Labs sent, including: CMP, Magnesium, ABG, Troponins  - Primary team notified?  Yes  - Family Notified? Will notify family after transfer to ICU  - Additional notes/ transfer status: Transferring to [facility] ICU for further care. Patient's PCP at bedside.     Virl Axe, MD  09/24/2020, 11:47 AM

## 2020-09-24 NOTE — Procedures (Signed)
FEMORAL ARTERIAL LINE INSERTION  Date: 09/24/20 Time: [place]. Renella Cunas, MD  Procedure Indication  Hypotension/access  Consent Yes, per patient  Anesthesia Lido 5 cc + bicarb 2 cc  Description of Procedure A time-out was completed verifying correct patient, procedure, site, positioning, and special equipment if applicable. The patient's left groin was prepped and draped in sterile fashion. 1% Lidocaine was used to anesthetize the area. An Arrow arterial line was introduced into the left femoral artery. The catheter was threaded over the guide wire and the needle was removed with appropriate pulsatile blood return. The catheter was then sutured in place to the skin and a sterile dressing applied. The supervising fellow was present for the entire procedure.  Complications  The patient was stable throughout the procedure. There were no complications.    Blood Loss 1 cc  Dion Body, MD

## 2020-09-24 NOTE — Progress Notes (Signed)
Progress Note  Patient Name: [name] Date of Encounter: 09/24/2020  Primary Cardiologist: Larae Grooms, MD   Subjective   Feels well overall. Discussed plan in detail.  Chest soreness from CPR. Reports DOE at home.   Inpatient Medications    Scheduled Meds: . acidophilus  1 capsule Oral QPM  . allopurinol  100 mg Oral q AM  . atorvastatin  40 mg Oral QHS  . brimonidine  1 drop Right Eye QHS   And  . brimonidine  2 drop Left Eye QHS  . calcium carbonate  1 tablet Oral Q lunch  . [START ON 09/26/2020] cinacalcet  30 mg Oral Once per day on Tue Thu  . citalopram  20 mg Oral q AM  . clopidogrel  75 mg Oral q AM  . heparin  5,000 Units Subcutaneous Q8H  . insulin aspart  0-5 Units Subcutaneous QHS  . insulin aspart  0-6 Units Subcutaneous TID WC  . latanoprost  1 drop Both Eyes QHS  . polycarbophil  625 mg Oral BID WC  . sevelamer carbonate  1,600 mg Oral BID WC  . sodium chloride flush  3 mL Intravenous Q12H  . timolol  1 drop Left Eye QHS  . vitamin B-12  1,000 mcg Oral q AM   Continuous Infusions: . amiodarone 30 mg/hr (09/24/20 0626)   PRN Meds: acetaminophen, famotidine, lidocaine-prilocaine, nitroGLYCERIN   Vital Signs    Vitals:   09/24/20 0133 09/24/20 0324 09/24/20 0620 09/24/20 0748  BP:  (!) 94/48 (!) 103/93 (!) 84/65  Pulse:  79 67 66  Resp:  17 18 16   Temp: 98.4 F (36.9 C) 98.5 F (36.9 C) 98.4 F (36.9 C) 98.7 F (37.1 C)  TempSrc: Oral Oral Oral Oral  SpO2:  97% 100% 100%  Weight:      Height:        Intake/Output Summary (Last 24 hours) at 09/24/2020 0959 Last data filed at 09/24/2020 0400 Gross per 24 hour  Intake 178.13 ml  Output -  Net 178.13 ml   Filed Weights   09/23/20 1858  Weight: 77.1 kg    Telemetry    SR with PVCs- Personally Reviewed  ECG    SR RBBB - Personally Reviewed  Physical Exam   GEN: No acute distress.   Neck: No JVD Cardiac: regular rhythm, normal rate, no murmurs. Respiratory: Clear  to auscultation bilaterally. GI: Soft, nontender, non-distended  MS: Trace diffuse edema; No deformity. Neuro:  Nonfocal  Psych: Normal affect   Labs    Chemistry Recent Labs  Lab 09/23/20 1937 09/24/20 0315  NA 137  --   K 3.3*  --   CL 93*  --   CO2 34*  --   GLUCOSE 200*  --   BUN 7*  --   CREATININE 2.74* 3.09*  CALCIUM 7.7*  --   PROT 6.2*  --   ALBUMIN 3.0*  --   AST 43*  --   ALT 27  --   ALKPHOS 75  --   BILITOT 0.6  --   GFRNONAA 17* 15*  ANIONGAP 10  --      Hematology Recent Labs  Lab 09/23/20 1937 09/24/20 0315  WBC 5.1 6.1  RBC 3.25* 3.23*  HGB 11.0* 11.0*  HCT 34.5* 34.2*  MCV 106.2* 105.9*  MCH 33.8 34.1*  MCHC 31.9 32.2  RDW 15.0 15.1  PLT 129* 123*    Cardiac EnzymesNo results for input(s): TROPONINI in the last  168 hours. No results for input(s): TROPIPOC in the last 168 hours.   BNPNo results for input(s): BNP, PROBNP in the last 168 hours.   DDimer No results for input(s): DDIMER in the last 168 hours.   Radiology    CT Head Wo Contrast  Result Date: 09/23/2020 CLINICAL DATA:  Encephalopathy EXAM: CT HEAD WITHOUT CONTRAST TECHNIQUE: Contiguous axial images were obtained from the base of the skull through the vertex without intravenous contrast. COMPARISON:  None. FINDINGS: Brain: There is no mass, hemorrhage or extra-axial collection. The size and configuration of the ventricles and extra-axial CSF spaces are normal. There is hypoattenuation of the white matter, most commonly indicating chronic small vessel disease. Vascular: Atherosclerotic calcification of the internal carotid arteries at the skull base. No abnormal hyperdensity of the major intracranial arteries or dural venous sinuses. Skull: The visualized skull base, calvarium and extracranial soft tissues are normal. Sinuses/Orbits: Right maxillary sinus mucosal thickening. The orbits are normal. IMPRESSION: Chronic small vessel disease without acute intracranial abnormality.  Electronically Signed   By: Ulyses Jarred M.D.   On: 09/23/2020 20:47   DG Chest Port 1 View  Result Date: 09/23/2020 CLINICAL DATA:  Ventricular tachycardia, cardiac arrest status post resuscitation EXAM: PORTABLE CHEST 1 VIEW COMPARISON:  04/10/2018 FINDINGS: Single frontal view of the chest demonstrates stable enlargement the cardiac silhouette, with postsurgical changes from median sternotomy and bypass surgery. Retrocardiac consolidation may reflect atelectasis, infection, or aspiration. Small left pleural effusion. No pneumothorax. No acute displaced fractures. Vascular stent left axillary region. IMPRESSION: 1. Left lower lobe consolidation may reflect atelectasis, infection, or aspiration. 2. Small left pleural effusion. Electronically Signed   By: Randa Ngo M.D.   On: 09/23/2020 19:21    Cardiac Studies   Echo pending  Patient Profile     44F with CAD s/p CABG x3 (LIMA-LAD, SVG-diag, SVG-RCA), prior VF arrest in the setting of SVG occlusion , ESRD on iHD, post op AF, TIA, CAS s/p L CAE (2009), MDD, DM2, diverticulosis, GERD, glaucoma, and HTN who presents following recurrent LOC and subsequent witnessed arrest.   Assessment & Plan   Principal Problem:   Syncope and collapse Active Problems:   Atrial fibrillation (HCC)   CAD (coronary artery disease)   Hypokalemia   QT prolongation   Benign essential HTN   V-tach ([place])   ESRD on dialysis ([place])   Anemia of chronic disease   Diabetes mellitus type 2 in nonobese [facility])   Witnessed arrest, suspect WCT/ischemic VT. - CAD s/p CABG with prior VT/VF arrest with cath showing occluded SVG requiring PCI in 2019. EMS notes WCT rate 200, terminating prior to defibrillation. She did require CPR.  - recommend LHC tomorrow for suspected ischemic VT. If no ischemic substrate, she needs an EP consultation for secondary prevention ICD.  - continue IV amiodarone without interruption. In SR currently with occasional PVCs. - Trop 20>>30.  -  need K>4, Mg>2, involve nephrology in electrolyte management if needed.  - TTE pending. - BP unreliable measured from leg. Recommend checking both legs, and Doppler peripheral pulse for confirmation. BP difficult to assess, recommend frequent patient checks overnight.   INFORMED CONSENT: I have reviewed the risks, indications, and alternatives to cardiac catheterization, possible angioplasty, and stenting with the patient. Risks include but are not limited to bleeding, infection, vascular injury, stroke, myocardial infection, arrhythmia, kidney injury, radiation-related injury in the case of prolonged fluoroscopy use, emergency cardiac surgery, and death. The patient understands the risks of serious complication is  1-2 in 1000 with diagnostic cardiac cath and 1-2% or less with angioplasty/stenting.   >35 minutes on this encounter with 20 minutes of face to face time.      For questions or updates, please contact [place] Please consult [email or web address] for contact info under        Signed, Elouise Munroe, MD  09/24/2020, 9:59 AM

## 2020-09-24 NOTE — Progress Notes (Signed)
PT Cancellation Note  Patient Details Name: [name] MRN: [id number] DOB: [birth year]   Cancelled Treatment:    Reason Eval/Treat Not Completed: Patient not medically ready. Pt underwent Vtach arrest requiring CPR. PT will hold evaluation at this time until the patient is more medically stable.    Zenaida Niece 09/24/2020, 12:12 PM

## 2020-09-24 NOTE — Progress Notes (Signed)
RRT responded to [place]. ROSC achieved, blow buy O2 given.

## 2020-09-24 NOTE — ED Notes (Signed)
Jonelle Sidle, MD notified of pt's BP.  Amiodarone gtt decreased to 30mg /hr per MD.

## 2020-09-24 NOTE — Progress Notes (Signed)
PROGRESS NOTE    [name]  SAY:[id number] DOB: Apr 11, 1941 DOA: 09/23/2020 PCP: Josetta Huddle, MD    Brief Narrative:  80 y.o. female with medical history significant of end-stage renal disease on hemodialysis TTS, coronary artery disease, carotid stenosis, atrial fibrillation, previous history of nonsustained V. tach, diabetes, essential hypertension, hyperlipidemia, hard of hearing who presented with multiple episodes of syncope prior to admit, later found to have vtach by EMS. Pt was admitted for further work up  Assessment & Plan:   Principal Problem:   Syncope and collapse Active Problems:   Atrial fibrillation (HCC)   CAD (coronary artery disease)   Hypokalemia   QT prolongation   Benign essential HTN   Ventricular tachycardia (HCC)   ESRD on dialysis ([place])   Anemia of chronic disease   Diabetes mellitus type 2 in nonobese [facility])   #1 multiple syncopal episodes likely secondary to recurrent symptomatic vtach:  -Cardiology was consulted and is following -Initial plan was for [facility] on 3/14. However, on the afternoon of 3/13, patient developed developed symptomatic vtach, prompting Code Blue. See Code Blue documentation. Briefly, patient required brief chest compression with ROSC. Pt did receive bolus of amiodarone and later given lidocaine per Cardiology.  -Pt underwent LHC on 3/13 with findings of new 100% ostial and prox CTO of SVG-RCA with recommendation for continued amiodarone load and consider lidocaine if VT recurs -Recommendation for EP for possibility for ICD  #2 paroxysmal atrial fibrillation:  -Noted to be not a candidate for anticoagulation due to previous brain bleed.  Continue with amiodarone per above.  #3 diabetes:  -Sliding scale insulin as needed -a1c of 5.0 noted  #4 essential hypertension:  -Blood pressures noted to be low with sbp in the 60-70's. Discussed with Cardiology. As pt is completely asymptomatic otherwise, suspect possible  false-readings -Family in room is already familiar with patient's baseline hx of low BP  #5 end-stage renal disease:  -Continue per nephrology.   -Cont HD as tolerated.  #6 anemia of chronic disease:  -repeat CBC in AM  #7 hypokalemia: -Replaced -Cont to follow lytes and replace as needed with goal K>4 and Mg>2  DVT prophylaxis: Heparin subq Code Status: Full Family Communication: Pt in room, pt's daughter at bedside  Status is: Inpatient  Remains inpatient appropriate because:Hemodynamically unstable, Ongoing diagnostic testing needed not appropriate for outpatient work up and Inpatient level of care appropriate due to severity of illness   Dispo: The patient is from: Home              Anticipated d/c is to: Home              Patient currently is not medically stable to d/c.   Difficult to place patient No       Consultants:   Cardiology  Nephrology  Procedures:   [facility] 3/13  Antimicrobials: Anti-infectives (From admission, onward)   None       Subjective: This AM pt was without complaints and was in very good spirits, joking with family present  Objective: Vitals:   09/24/20 1323 09/24/20 1328 09/24/20 1333 09/24/20 1400  BP: (!) 107/57 (!) 82/33 98/71   Pulse: (!) 55 (!) 54 (!) 57   Resp: (!) 25 (!) 27 (!) 21 20  Temp:      TempSrc:      SpO2: 95% 94% 98%   Weight:      Height:        Intake/Output Summary (Last 24 hours) at 09/24/2020  1630 Last data filed at 09/24/2020 0400 Gross per 24 hour  Intake 178.13 ml  Output --  Net 178.13 ml   Filed Weights   09/23/20 1858  Weight: 77.1 kg    Examination:  General exam: Appears calm and comfortable  Respiratory system: Clear to auscultation. Respiratory effort normal. Cardiovascular system: S1 & S2 heard, Regular Gastrointestinal system: Abdomen is nondistended, soft and nontender. No organomegaly or masses felt. Normal bowel sounds heard. Central nervous system: Alert and oriented. No  focal neurological deficits. Extremities: Symmetric 5 x 5 power. Skin: No rashes, lesions Psychiatry: Judgement and insight appear normal. Mood & affect appropriate.   Data Reviewed: I have personally reviewed following labs and imaging studies  CBC: Recent Labs  Lab 09/23/20 1937 09/24/20 0315  WBC 5.1 6.1  NEUTROABS 4.1  --   HGB 11.0* 11.0*  HCT 34.5* 34.2*  MCV 106.2* 105.9*  PLT 129* 798*   Basic Metabolic Panel: Recent Labs  Lab 09/23/20 1937 09/24/20 0315 09/24/20 1059  NA 137  --  139  K 3.3*  --  3.7  CL 93*  --  94*  CO2 34*  --  34*  GLUCOSE 200*  --  157*  BUN 7*  --  12  CREATININE 2.74* 3.09* 3.62*  CALCIUM 7.7*  --  8.1*  MG 1.7  --  1.8  PHOS 2.2*  --   --    GFR: Estimated Creatinine Clearance: 12.5 mL/min (A) (by C-G formula based on SCr of 3.62 mg/dL (H)). Liver Function Tests: Recent Labs  Lab 09/23/20 1937  AST 43*  ALT 27  ALKPHOS 75  BILITOT 0.6  PROT 6.2*  ALBUMIN 3.0*   No results for input(s): LIPASE, AMYLASE in the last 168 hours. No results for input(s): AMMONIA in the last 168 hours. Coagulation Profile: Recent Labs  Lab 09/23/20 1937  INR 1.1   Cardiac Enzymes: No results for input(s): CKTOTAL, CKMB, CKMBINDEX, TROPONINI in the last 168 hours. BNP (last 3 results) No results for input(s): PROBNP in the last 8760 hours. HbA1C: Recent Labs    09/24/20 0315  HGBA1C 5.0   CBG: Recent Labs  Lab 09/24/20 0555 09/24/20 1606  GLUCAP 134* 228*   Lipid Profile: No results for input(s): CHOL, HDL, LDLCALC, TRIG, CHOLHDL, LDLDIRECT in the last 72 hours. Thyroid Function Tests: Recent Labs    09/24/20 1059  TSH 2.440   Anemia Panel: No results for input(s): VITAMINB12, FOLATE, FERRITIN, TIBC, IRON, RETICCTPCT in the last 72 hours. Sepsis Labs: No results for input(s): PROCALCITON, LATICACIDVEN in the last 168 hours.  Recent Results (from the past 240 hour(s))  Resp Panel by RT-PCR (Flu A&B, Covid) Nasopharyngeal  Swab     Status: None   Collection Time: 09/23/20  7:59 PM   Specimen: Nasopharyngeal Swab; Nasopharyngeal(NP) swabs in vial transport medium  Result Value Ref Range Status   SARS Coronavirus 2 by RT PCR NEGATIVE NEGATIVE Final    Comment: (NOTE) SARS-CoV-2 target nucleic acids are NOT DETECTED.  The SARS-CoV-2 RNA is generally detectable in upper respiratory specimens during the acute phase of infection. The lowest concentration of SARS-CoV-2 viral copies this assay can detect is 138 copies/mL. A negative result does not preclude SARS-Cov-2 infection and should not be used as the sole basis for treatment or other patient management decisions. A negative result may occur with  improper specimen collection/handling, submission of specimen other than nasopharyngeal swab, presence of viral mutation(s) within the areas targeted by this assay,  and inadequate number of viral copies(<138 copies/mL). A negative result must be combined with clinical observations, patient history, and epidemiological information. The expected result is Negative.  Fact Sheet for Patients:  EntrepreneurPulse.com.au  Fact Sheet for Healthcare Providers:  IncredibleEmployment.be  This test is no t yet approved or cleared by the [place] FDA and  has been authorized for detection and/or diagnosis of SARS-CoV-2 by FDA under an Emergency Use Authorization (EUA). This EUA will remain  in effect (meaning this test can be used) for the duration of the COVID-19 declaration under Section 564(b)(1) of the Act, 21 U.S.C.section 360bbb-3(b)(1), unless the authorization is terminated  or revoked sooner.       Influenza A by PCR NEGATIVE NEGATIVE Final   Influenza B by PCR NEGATIVE NEGATIVE Final    Comment: (NOTE) The Xpert Xpress SARS-CoV-2/FLU/RSV plus assay is intended as an aid in the diagnosis of influenza from Nasopharyngeal swab specimens and should not be used as a sole  basis for treatment. Nasal washings and aspirates are unacceptable for Xpert Xpress SARS-CoV-2/FLU/RSV testing.  Fact Sheet for Patients: EntrepreneurPulse.com.au  Fact Sheet for Healthcare Providers: IncredibleEmployment.be  This test is not yet approved or cleared by the [place] FDA and has been authorized for detection and/or diagnosis of SARS-CoV-2 by FDA under an Emergency Use Authorization (EUA). This EUA will remain in effect (meaning this test can be used) for the duration of the COVID-19 declaration under Section 564(b)(1) of the Act, 21 U.S.C. section 360bbb-3(b)(1), unless the authorization is terminated or revoked.  Performed at [facility], [place] [address], [place], [place] [address]   MRSA PCR Screening     Status: None   Collection Time: 09/24/20 12:08 PM   Specimen: Nasopharyngeal  Result Value Ref Range Status   MRSA by PCR NEGATIVE NEGATIVE Final    Comment:        The GeneXpert MRSA Assay (FDA approved for NASAL specimens only), is one component of a comprehensive MRSA colonization surveillance program. It is not intended to diagnose MRSA infection nor to guide or monitor treatment for MRSA infections. Performed at [facility], [place] [address], [place], [place] [address]      Radiology Studies: CT Head Wo Contrast  Result Date: 09/23/2020 CLINICAL DATA:  Encephalopathy EXAM: CT HEAD WITHOUT CONTRAST TECHNIQUE: Contiguous axial images were obtained from the base of the skull through the vertex without intravenous contrast. COMPARISON:  None. FINDINGS: Brain: There is no mass, hemorrhage or extra-axial collection. The size and configuration of the ventricles and extra-axial CSF spaces are normal. There is hypoattenuation of the white matter, most commonly indicating chronic small vessel disease. Vascular: Atherosclerotic calcification of the internal carotid arteries at the skull base. No abnormal  hyperdensity of the major intracranial arteries or dural venous sinuses. Skull: The visualized skull base, calvarium and extracranial soft tissues are normal. Sinuses/Orbits: Right maxillary sinus mucosal thickening. The orbits are normal. IMPRESSION: Chronic small vessel disease without acute intracranial abnormality. Electronically Signed   By: Ulyses Jarred M.D.   On: 09/23/2020 20:47   MR BRAIN WO CONTRAST  Result Date: 09/24/2020 CLINICAL DATA:  7-year-old female with recurrent syncope and confusion. EXAM: MRI HEAD WITHOUT CONTRAST TECHNIQUE: Multiplanar, multiecho pulse sequences of the brain and surrounding structures were obtained without intravenous contrast. COMPARISON:  Head CT 09/23/2020.  Brain MRI 07/13/2015. FINDINGS: Brain: No restricted diffusion to suggest acute infarction. No midline shift, mass effect, evidence of mass lesion, ventriculomegaly, extra-axial collection or acute  intracranial hemorrhage. Cervicomedullary junction and pituitary are within normal limits. Mild generalized cerebral volume loss since 2016. But gray and white matter signal remains largely normal for age. No cortical encephalomalacia. But there are occasional chronic micro hemorrhages in the brain, including the posterior right frontal lobe (series 14, image 37) and left cerebellum (image 13). Furthermore, there is intrinsic T1 hyperintensity in the bilateral globus pallidus since 2016 (series 16, image 30 on the left. Vascular: Major intracranial vascular flow voids are stable since 2016. 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 Mild associated visible cervical spinal stenosis, including at C2-C3. Heterogeneous bone marrow signal but within normal limits. Sinuses/Orbits: Negative orbits. Mild to moderate right maxillary sinus mucosal thickening is new, but other paranasal sinuses are improved compared to 2016. Other: Trace  right mastoid fluid.  Negative visible nasopharynx. IMPRESSION: 1. Intrinsic T1 hyperintensity has developed in the bilateral globus pallidus since a 2016 MRI. This is most frequently associated with Hepatic Insufficiency / Cirrhosis. Correlation with serum Ammonia levels and liver function tests is recommended. 2. No other acute intracranial abnormality. Mild generalized cerebral volume loss since 2016 with occasional chronic micro hemorrhages in the brain. 3. 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 Electronically Signed   By: Genevie Ann M.D.   On: 09/24/2020 11:34   CARDIAC CATHETERIZATION  Result Date: 09/24/2020  -----------NATIVE VESSELS-------------------------  Ost LAD to Prox LAD lesion is 95% stenosed. Prox LAD lesion is 100% stenosed with 100% stenosed side branch in Ost 1st Diag.  Ost 1st Mrg lesion is 75% stenosed.  Prox Cx to Mid Cx lesion is 100% stenosed (just after 1st Mrg) with side branch in Ost 2nd Mrg.  Prox RCA lesion is 95% stenosed. Mid RCA to Dist RCA lesion is 100% stenosed.  ------------GRAFTS--------------------------  SVG-RCA graft was visualized by angiography and is small. Origin to Mid Graft lesion is 100% stenosed. - Previously stented Graft  SVG-1stDiag graft was visualized by angiography. Dist Graft to Insertion lesion is 55% stenosed.  LIMA-dLAD graft was visualized by angiography and is normal in caliber. The graft exhibits no disease. There is no competitive flow  ---------------------------  There is mild left ventricular systolic dysfunction. The left ventricular ejection fraction is 45-50% by visual estimate.  LV end diastolic pressure is mildly elevated.  There is a significant difference in arterial pressures versus cuff pressures-arterial pressures are at least 30- 40 mmHg higher.  SUMMARY  Severe native vessel CAD:  100% LAD following a 95% calcified lesion prior to SP1;  100% mid RCA following severe 80%  stenosis,  100% AV groove LCx after OM1 with bridging collaterals to small PL branch,  75 to 80% calcified ostial OM1 (large bifurcating ramus-like vessel)  Grafts:  New lesion: 100% ostial and proximal CTO of SVG-RCA (was a very small caliber vessel previously stented.  The nature of this occlusion is such that it does not appear to be acute-the patient not actively having chest pain and no ST elevations, minimal troponin. => Likely not a reasonable target for PCI  SVG- 1st Diag has an anastomotic 50 to 60% stenosis of questionable significance (at this stage, the graft is the same caliber as a target vessel) ->  I chose not to attempt intervention on this location as she is hemodynamically clinically stable at this point. Would not want to complicate issues by potentially occluding another graft.  Patent LIMA-LAD with no left-to-right collaterals  Mildly reduced EF with inferobasal hypokinesis-45 to 50%.  Borderline elevated LVEDP of 15 mmHg  Significant discrepancy between aortic blood pressures and cuff blood pressures of at least 30 mmHg.  Cuff pressures are significantly lower. RECOMMENDATIONS  Return to CCU, would continue amiodarone load, consider lidocaine if VT recurs.  EP consultation to consider the possibility of ICD.  For future stabilization, could consider the possibility of intervention SVG-1st Diag (albeit a questionable significance), also atherectomy PCI of the OM1 (this is similar in appearance to previous cath in 2018, so this would only be to optimize distal flow - would likely need ischemic evaluation prior to proceeding with PCI on either lesion).  Prior to initiating pressor support in the ICU, would consider invasive arterial monitoring   DG Chest Port 1 View  Result Date: 09/23/2020 CLINICAL DATA:  Ventricular tachycardia, cardiac arrest status post resuscitation EXAM: PORTABLE CHEST 1 VIEW COMPARISON:  04/10/2018 FINDINGS: Single frontal view of the chest demonstrates  stable enlargement the cardiac silhouette, with postsurgical changes from median sternotomy and bypass surgery. Retrocardiac consolidation may reflect atelectasis, infection, or aspiration. Small left pleural effusion. No pneumothorax. No acute displaced fractures. Vascular stent left axillary region. IMPRESSION: 1. Left lower lobe consolidation may reflect atelectasis, infection, or aspiration. 2. Small left pleural effusion. Electronically Signed   By: Randa Ngo M.D.   On: 09/23/2020 19:21    Scheduled Meds: . acidophilus  1 capsule Oral QPM  . allopurinol  100 mg Oral q AM  . [START ON 09/25/2020] aspirin  81 mg Oral Pre-Cath  . atorvastatin  40 mg Oral QHS  . brimonidine  1 drop Right Eye QHS   And  . brimonidine  2 drop Left Eye QHS  . calcium carbonate  1 tablet Oral Q lunch  . Chlorhexidine Gluconate Cloth  6 each Topical Daily  . [START ON 09/26/2020] cinacalcet  30 mg Oral Once per day on Tue Thu  . citalopram  20 mg Oral q AM  . clopidogrel  75 mg Oral q AM  . heparin  5,000 Units Subcutaneous Q8H  . insulin aspart  0-5 Units Subcutaneous QHS  . insulin aspart  0-6 Units Subcutaneous TID WC  . latanoprost  1 drop Both Eyes QHS  . lidocaine  100 mg Intravenous STAT  . polycarbophil  625 mg Oral BID WC  . potassium chloride  20 mEq Oral Once  . sevelamer carbonate  1,600 mg Oral BID WC  . sodium chloride flush  3 mL Intravenous Q12H  . sodium chloride flush  3 mL Intravenous Q12H  . sodium chloride flush  3 mL Intravenous Q12H  . timolol  1 drop Left Eye QHS  . vitamin B-12  1,000 mcg Oral q AM   Continuous Infusions: . sodium chloride    . [START ON 09/25/2020] sodium chloride    . sodium chloride    . sodium chloride    . amiodarone 30 mg/hr (09/24/20 1626)  . lidocaine 1 mg/min (09/24/20 1418)  . magnesium sulfate bolus IVPB       LOS: 1 day   Marylu Lund, MD [facility] Pager On Amion  If 7PM-7AM, please contact night-coverage 09/24/2020, 4:30 PM

## 2020-09-24 NOTE — Progress Notes (Signed)
I was called emergently to the bedside at 11:34 AM for cardiac arrest with VT VF.  Patient was undergoing her echocardiogram when she was noted to have cardiac arrest with 3-4 beats of ventricular tachycardia quickly degenerating to ventricular fibrillation.  CPR was initiated due to pulseless VT, she received approximately 90 seconds of CPR.  No defibrillations were required no epinephrine required.   Patient has a history of coronary artery disease status post CABG with LIMA to LAD, SVG to diagonal and SVG to posterolateral artery in the right circulation.  Prior episode of VT VF in 2019 requiring SVG to PLA stent.  Presented yesterday to the hospital with witnessed arrest at home requiring CPR and wide-complex tachycardia in route to hospital not requiring defibrillation.  Self terminating.  She appears neurologically intact, responding appropriately and moving all extremities.  I immediately contacted the on-call interventional cardiologist for emergent cardiac catheterization with VT VF arrest likely ischemic in origin.  She is on IV amiodarone infusion rate increased to 60 mg/h, and she received a bolus of 300 mg of amiodarone during resuscitation.  Upon presentation to the ICU, continued ectopy noted, will initiate lidocaine with bolus and infusion for ischemic VT. Lidocaine bolus 100 mg, infusion rate 1 mg/min.  9 echo images obtained before arrest - LVEF 45% with akinesis of the inferolateral basal wall and hypokinesis of inferolateral mid wall.  No MR or AR on limited clips. MV appears degenerative with calcification.   Exam: Gen: alert, oriented CV: RR, normal rate, no murmurs post arrest.  Lungs: clear, no increased work of breathing. Extrem: warm.   CRITICAL CARE Performed by: Cherlynn Kaiser, MD   Total critical care time: 45 minutes   Critical care time was exclusive of separately billable procedures and treating other patients.   Critical care was necessary to treat or prevent  imminent or life-threatening deterioration.   Critical care was time spent personally by me (independent of APPs or residents) on the following activities: development of treatment plan with patient and/or surrogate as well as nursing, discussions with consultants, evaluation of patient's response to treatment, examination of patient, obtaining history from patient or surrogate, ordering and performing treatments and interventions, ordering and review of laboratory studies, ordering and review of radiographic studies, pulse oximetry and re-evaluation of patient's condition.

## 2020-09-24 NOTE — ED Notes (Signed)
Left lower leg IO removed. Needle intact. Pt tolerated well. Bandage placed.

## 2020-09-24 NOTE — Progress Notes (Signed)
  Echocardiogram 2D Echocardiogram has been performed.  [name] 09/24/2020, 4:16 PM

## 2020-09-25 ENCOUNTER — Encounter (HOSPITAL_COMMUNITY): Payer: Self-pay | Admitting: Cardiology

## 2020-09-25 DIAGNOSIS — E785 Hyperlipidemia, unspecified: Secondary | ICD-10-CM

## 2020-09-25 DIAGNOSIS — R9431 Abnormal electrocardiogram [ECG] [EKG]: Secondary | ICD-10-CM

## 2020-09-25 DIAGNOSIS — R55 Syncope and collapse: Secondary | ICD-10-CM

## 2020-09-25 DIAGNOSIS — Z515 Encounter for palliative care: Secondary | ICD-10-CM

## 2020-09-25 DIAGNOSIS — Z992 Dependence on renal dialysis: Secondary | ICD-10-CM

## 2020-09-25 DIAGNOSIS — N186 End stage renal disease: Secondary | ICD-10-CM

## 2020-09-25 DIAGNOSIS — I25718 Atherosclerosis of autologous vein coronary artery bypass graft(s) with other forms of angina pectoris: Secondary | ICD-10-CM | POA: Diagnosis not present

## 2020-09-25 DIAGNOSIS — I2510 Atherosclerotic heart disease of native coronary artery without angina pectoris: Secondary | ICD-10-CM

## 2020-09-25 DIAGNOSIS — Z7189 Other specified counseling: Secondary | ICD-10-CM

## 2020-09-25 DIAGNOSIS — I2489 Other forms of acute ischemic heart disease: Secondary | ICD-10-CM | POA: Diagnosis not present

## 2020-09-25 DIAGNOSIS — E119 Type 2 diabetes mellitus without complications: Secondary | ICD-10-CM | POA: Diagnosis not present

## 2020-09-25 DIAGNOSIS — I248 Other forms of acute ischemic heart disease: Secondary | ICD-10-CM | POA: Diagnosis not present

## 2020-09-25 DIAGNOSIS — E876 Hypokalemia: Secondary | ICD-10-CM

## 2020-09-25 DIAGNOSIS — E1169 Type 2 diabetes mellitus with other specified complication: Secondary | ICD-10-CM

## 2020-09-25 DIAGNOSIS — I472 Ventricular tachycardia: Secondary | ICD-10-CM | POA: Diagnosis not present

## 2020-09-25 DIAGNOSIS — I10 Essential (primary) hypertension: Secondary | ICD-10-CM

## 2020-09-25 DIAGNOSIS — I480 Paroxysmal atrial fibrillation: Secondary | ICD-10-CM

## 2020-09-25 DIAGNOSIS — I25810 Atherosclerosis of coronary artery bypass graft(s) without angina pectoris: Secondary | ICD-10-CM | POA: Diagnosis present

## 2020-09-25 LAB — CBC
HCT: 33.7 % — ABNORMAL LOW (ref 36.0–46.0)
Hemoglobin: 10.3 g/dL — ABNORMAL LOW (ref 12.0–15.0)
MCH: 33.3 pg (ref 26.0–34.0)
MCHC: 30.6 g/dL (ref 30.0–36.0)
MCV: 109.1 fL — ABNORMAL HIGH (ref 80.0–100.0)
Platelets: 129 10*3/uL — ABNORMAL LOW (ref 150–400)
RBC: 3.09 MIL/uL — ABNORMAL LOW (ref 3.87–5.11)
RDW: 15.8 % — ABNORMAL HIGH (ref 11.5–15.5)
WBC: 8.1 10*3/uL (ref 4.0–10.5)
nRBC: 0.6 % — ABNORMAL HIGH (ref 0.0–0.2)

## 2020-09-25 LAB — GLUCOSE, CAPILLARY
Glucose-Capillary: 162 mg/dL — ABNORMAL HIGH (ref 70–99)
Glucose-Capillary: 220 mg/dL — ABNORMAL HIGH (ref 70–99)
Glucose-Capillary: 177 mg/dL — ABNORMAL HIGH (ref 70–99)
Glucose-Capillary: 157 mg/dL — ABNORMAL HIGH (ref 70–99)

## 2020-09-25 LAB — BASIC METABOLIC PANEL
Anion gap: 13 (ref 5–15)
BUN: 19 mg/dL (ref 8–23)
CO2: 30 mmol/L (ref 22–32)
Calcium: 8.5 mg/dL — ABNORMAL LOW (ref 8.9–10.3)
Chloride: 90 mmol/L — ABNORMAL LOW (ref 98–111)
Creatinine, Ser: 4.46 mg/dL — ABNORMAL HIGH (ref 0.44–1.00)
GFR, Estimated: 9 mL/min — ABNORMAL LOW (ref >60)
Glucose, Bld: 194 mg/dL — ABNORMAL HIGH (ref 70–99)
Potassium: 4.6 mmol/L (ref 3.5–5.1)
Sodium: 133 mmol/L — ABNORMAL LOW (ref 135–145)

## 2020-09-25 LAB — MAGNESIUM: Magnesium: 2.7 mg/dL — ABNORMAL HIGH (ref 1.7–2.4)

## 2020-09-25 LAB — LIDOCAINE LEVEL: Lidocaine Lvl: 3.8 ug/mL (ref 1.5–5.0)

## 2020-09-25 MED ORDER — OXYCODONE-ACETAMINOPHEN 5-325 MG PO TABS
1.0000 | ORAL_TABLET | ORAL | Status: DC | PRN
  Administered 2020-09-25 (×2): 1 via ORAL
  Filled 2020-09-25 (×2): qty 1

## 2020-09-25 MED ORDER — ACETAMINOPHEN 325 MG PO TABS
325.0000 mg | ORAL_TABLET | Freq: Three times a day (TID) | ORAL | Status: DC | PRN
Start: 2020-09-25 — End: 2020-10-10
  Administered 2020-09-25 – 2020-10-01 (×2): 650 mg via ORAL
  Filled 2020-09-25 (×2): qty 2

## 2020-09-25 MED ORDER — VITAMIN B-12 1000 MCG PO TABS
1000.0000 ug | ORAL_TABLET | Freq: Every morning | ORAL | Status: DC
  Administered 2020-09-25: 1000 ug via ORAL
  Filled 2020-09-25: qty 1

## 2020-09-25 MED ORDER — MORPHINE SULFATE (PF) 2 MG/ML IV SOLN: 1.0000 mg | INTRAVENOUS | Status: DC | PRN

## 2020-09-25 MED ORDER — RENA-VITE PO TABS
1.0000 | ORAL_TABLET | Freq: Every day | ORAL | Status: DC
  Administered 2020-09-25 – 2020-10-08 (×13): 1 via ORAL
  Filled 2020-09-25 (×13): qty 1

## 2020-09-25 MED ORDER — NEPRO/CARBSTEADY PO LIQD
237.0000 mL | Freq: Three times a day (TID) | ORAL | Status: DC
  Administered 2020-09-25 – 2020-10-10 (×31): 237 mL via ORAL

## 2020-09-25 MED ORDER — ALLOPURINOL 100 MG PO TABS
100.0000 mg | ORAL_TABLET | Freq: Every morning | ORAL | Status: DC
  Administered 2020-09-25 – 2020-10-09 (×14): 100 mg via ORAL
  Filled 2020-09-25 (×16): qty 1

## 2020-09-25 MED ORDER — AMIODARONE HCL IN DEXTROSE 360-4.14 MG/200ML-% IV SOLN
30.0000 mg/h | INTRAVENOUS | Status: DC
  Administered 2020-09-25 – 2020-09-26 (×3): 30 mg/h via INTRAVENOUS
  Filled 2020-09-25 (×3): qty 200

## 2020-09-25 MED ORDER — MORPHINE SULFATE (PF) 2 MG/ML IV SOLN
1.0000 mg | INTRAVENOUS | Status: DC | PRN
  Administered 2020-09-25 – 2020-09-30 (×11): 1 mg via INTRAVENOUS
  Filled 2020-09-25 (×11): qty 1

## 2020-09-25 MED ORDER — CHLORHEXIDINE GLUCONATE CLOTH 2 % EX PADS
6.0000 | MEDICATED_PAD | Freq: Every day | CUTANEOUS | Status: DC
  Administered 2020-09-25 – 2020-09-30 (×3): 6 via TOPICAL

## 2020-09-25 MED ORDER — CITALOPRAM HYDROBROMIDE 20 MG PO TABS
20.0000 mg | ORAL_TABLET | Freq: Every morning | ORAL | Status: DC
  Administered 2020-09-25 – 2020-09-26 (×2): 20 mg via ORAL
  Filled 2020-09-25 (×3): qty 1

## 2020-09-25 MED ORDER — OXYCODONE-ACETAMINOPHEN 5-325 MG PO TABS
1.0000 | ORAL_TABLET | ORAL | Status: DC | PRN
  Administered 2020-09-25 – 2020-10-08 (×10): 1 via ORAL
  Filled 2020-09-25 (×10): qty 1

## 2020-09-25 MED ORDER — CLOPIDOGREL BISULFATE 75 MG PO TABS
75.0000 mg | ORAL_TABLET | Freq: Every morning | ORAL | Status: DC
  Administered 2020-09-25 – 2020-10-09 (×14): 75 mg via ORAL
  Filled 2020-09-25 (×16): qty 1

## 2020-09-25 MED FILL — Verapamil HCl IV Soln 2.5 MG/ML: INTRAVENOUS | Qty: 2 | Status: AC

## 2020-09-25 MED FILL — Heparin Sod (Porcine)-NaCl IV Soln 1000 Unit/500ML-0.9%: INTRAVENOUS | Qty: 500 | Status: AC

## 2020-09-25 NOTE — Progress Notes (Addendum)
PROGRESS NOTE    [name]  WGN:[id number] DOB: [birth year] DOA: 09/23/2020 PCP: Josetta Huddle, MD    Brief Narrative:  80 y.o. female with medical history significant of end-stage renal disease on hemodialysis TTS, coronary artery disease, carotid stenosis, atrial fibrillation, previous history of nonsustained V. tach, diabetes, essential hypertension, hyperlipidemia, hard of hearing who presented with multiple episodes of syncope prior to admit, later found to have vtach by EMS. Pt was admitted for further work up  Assessment & Plan:   Principal Problem:   Ventricular tachycardia ([place]) Active Problems:   Essential hypertension   Hyperlipidemia associated with type 2 diabetes mellitus (HCC)   Paroxysmal atrial fibrillation (HCC)   Coronary artery disease involving native heart without angina pectoris   Hypokalemia   QT prolongation   ESRD on dialysis ([place])   Anemia of chronic disease   Diabetes mellitus type 2 in nonobese (HCC)   Syncope and collapse   Coronary artery disease involving autologous vein bypass graft   Demand ischemia of myocardium ([place])  #1 multiple syncopal episodes likely secondary to recurrent symptomatic vtach:  -Cardiology was consulted and is following -Initial plan was for [facility] on 3/14. However, on the afternoon of 3/13, patient developed developed symptomatic vtach, prompting Code Blue. See Code Blue documentation. -Pt underwent LHC on 3/13 with findings of new 100% ostial and prox CTO of SVG-RCA with recommendation for continued amiodarone load -Pt was seen by EP, however pt is not candidate for ICD -Have consulted Palliative Care -Discussed with Cardiology. Pt is now DNR  #2 paroxysmal atrial fibrillation:  -Noted to be not a candidate for anticoagulation due to previous brain bleed. -Pt is continued with amiodarone per above.  #3 diabetes:  -Sliding scale insulin as needed -a1c of 5.0 noted -Stable at present  #4 essential hypertension:   -Blood pressures currently stable -Family is familiar with patient's baseline hx of low BP  #5 end-stage renal disease:  -Continue per nephrology.   -Continue with HD as tolerated  #6 anemia of chronic disease:  -hgb trends have been stable  #7 hypokalemia: -Replaced -Cont to follow lytes and replace as needed with goal K>4 and Mg>2  #8 DNR -See above. Unfortunately, pt has only limited options per Cardiology recs regarding recurrent vtach/vfib arrest -Code status was addressed and pt's wishes are noted to be DNR/DNI -Have consulted Palliative Care for goals of care  DVT prophylaxis: Heparin subq Code Status: Full Family Communication: Pt in room, pt's daughter currently at bedside  Status is: Inpatient  Remains inpatient appropriate because:Hemodynamically unstable, Ongoing diagnostic testing needed not appropriate for outpatient work up and Inpatient level of care appropriate due to severity of illness   Dispo: The patient is from: Home              Anticipated d/c is to: Home              Patient currently is not medically stable to d/c.   Difficult to place patient No  Consultants:   Cardiology  Nephrology  Palliative Care  Procedures:   [facility] 3/13  Antimicrobials: Anti-infectives (From admission, onward)   None      Subjective: Complaining of chest pains  Objective: Vitals:   09/25/20 1100 09/25/20 1106 09/25/20 1200 09/25/20 1300  BP:      Pulse: (!) 31  (!) 39 (!) 41  Resp: 19  13 15   Temp:  97.8 F (36.6 C)    TempSrc:  Axillary    SpO2:  99%  100% 100%  Weight:      Height:        Intake/Output Summary (Last 24 hours) at 09/25/2020 1806 Last data filed at 09/25/2020 1200 Gross per 24 hour  Intake 1294.03 ml  Output --  Net 1294.03 ml   Filed Weights   09/23/20 1858 09/25/20 0500  Weight: 77.1 kg 79.9 kg    Examination: General exam: Awake, laying in bed, in nad Respiratory system: Normal respiratory effort, no  wheezing Cardiovascular system: regular rate, s1, s2 Gastrointestinal system: Soft, nondistended, positive BS Central nervous system: CN2-12 grossly intact, strength intact Extremities: Perfused, no clubbing Skin: Normal skin turgor, no notable skin lesions seen Psychiatry: Mood normal // no visual hallucinations   Data Reviewed: I have personally reviewed following labs and imaging studies  CBC: Recent Labs  Lab 09/23/20 1937 09/24/20 0315 09/25/20 0424  WBC 5.1 6.1 8.1  NEUTROABS 4.1  --   --   HGB 11.0* 11.0* 10.3*  HCT 34.5* 34.2* 33.7*  MCV 106.2* 105.9* 109.1*  PLT 129* 123* 245*   Basic Metabolic Panel: Recent Labs  Lab 09/23/20 1937 09/24/20 0315 09/24/20 1059 09/25/20 0424  NA 137  --  139 133*  K 3.3*  --  3.7 4.6  CL 93*  --  94* 90*  CO2 34*  --  34* 30  GLUCOSE 200*  --  157* 194*  BUN 7*  --  12 19  CREATININE 2.74* 3.09* 3.62* 4.46*  CALCIUM 7.7*  --  8.1* 8.5*  MG 1.7  --  1.8 2.7*  PHOS 2.2*  --   --   --    GFR: Estimated Creatinine Clearance: 10.3 mL/min (A) (by C-G formula based on SCr of 4.46 mg/dL (H)). Liver Function Tests: Recent Labs  Lab 09/23/20 1937  AST 43*  ALT 27  ALKPHOS 75  BILITOT 0.6  PROT 6.2*  ALBUMIN 3.0*   No results for input(s): LIPASE, AMYLASE in the last 168 hours. No results for input(s): AMMONIA in the last 168 hours. Coagulation Profile: Recent Labs  Lab 09/23/20 1937  INR 1.1   Cardiac Enzymes: No results for input(s): CKTOTAL, CKMB, CKMBINDEX, TROPONINI in the last 168 hours. BNP (last 3 results) No results for input(s): PROBNP in the last 8760 hours. HbA1C: Recent Labs    09/24/20 0315  HGBA1C 5.0   CBG: Recent Labs  Lab 09/24/20 1940 09/24/20 2135 09/25/20 0636 09/25/20 1102 09/25/20 1642  GLUCAP 222* 209* 162* 220* 177*   Lipid Profile: No results for input(s): CHOL, HDL, LDLCALC, TRIG, CHOLHDL, LDLDIRECT in the last 72 hours. Thyroid Function Tests: Recent Labs    09/24/20 1059   TSH 2.440   Anemia Panel: No results for input(s): VITAMINB12, FOLATE, FERRITIN, TIBC, IRON, RETICCTPCT in the last 72 hours. Sepsis Labs: No results for input(s): PROCALCITON, LATICACIDVEN in the last 168 hours.  Recent Results (from the past 240 hour(s))  Resp Panel by RT-PCR (Flu A&B, Covid) Nasopharyngeal Swab     Status: None   Collection Time: 09/23/20  7:59 PM   Specimen: Nasopharyngeal Swab; Nasopharyngeal(NP) swabs in vial transport medium  Result Value Ref Range Status   SARS Coronavirus 2 by RT PCR NEGATIVE NEGATIVE Final    Comment: (NOTE) SARS-CoV-2 target nucleic acids are NOT DETECTED.  The SARS-CoV-2 RNA is generally detectable in upper respiratory specimens during the acute phase of infection. The lowest concentration of SARS-CoV-2 viral copies this assay can detect is 138 copies/mL. A negative result does  not preclude SARS-Cov-2 infection and should not be used as the sole basis for treatment or other patient management decisions. A negative result may occur with  improper specimen collection/handling, submission of specimen other than nasopharyngeal swab, presence of viral mutation(s) within the areas targeted by this assay, and inadequate number of viral copies(<138 copies/mL). A negative result must be combined with clinical observations, patient history, and epidemiological information. The expected result is Negative.  Fact Sheet for Patients:  EntrepreneurPulse.com.au  Fact Sheet for Healthcare Providers:  IncredibleEmployment.be  This test is no t yet approved or cleared by the [place] FDA and  has been authorized for detection and/or diagnosis of SARS-CoV-2 by FDA under an Emergency Use Authorization (EUA). This EUA will remain  in effect (meaning this test can be used) for the duration of the COVID-19 declaration under Section 564(b)(1) of the Act, 21 U.S.C.section 360bbb-3(b)(1), unless the authorization is  terminated  or revoked sooner.       Influenza A by PCR NEGATIVE NEGATIVE Final   Influenza B by PCR NEGATIVE NEGATIVE Final    Comment: (NOTE) The Xpert Xpress SARS-CoV-2/FLU/RSV plus assay is intended as an aid in the diagnosis of influenza from Nasopharyngeal swab specimens and should not be used as a sole basis for treatment. Nasal washings and aspirates are unacceptable for Xpert Xpress SARS-CoV-2/FLU/RSV testing.  Fact Sheet for Patients: EntrepreneurPulse.com.au  Fact Sheet for Healthcare Providers: IncredibleEmployment.be  This test is not yet approved or cleared by the [place] FDA and has been authorized for detection and/or diagnosis of SARS-CoV-2 by FDA under an Emergency Use Authorization (EUA). This EUA will remain in effect (meaning this test can be used) for the duration of the COVID-19 declaration under Section 564(b)(1) of the Act, 21 U.S.C. section 360bbb-3(b)(1), unless the authorization is terminated or revoked.  Performed at [facility], [place] [address] [place], [place] [address]   MRSA PCR Screening     Status: None   Collection Time: 09/24/20 12:08 PM   Specimen: Nasopharyngeal  Result Value Ref Range Status   MRSA by PCR NEGATIVE NEGATIVE Final    Comment:        The GeneXpert MRSA Assay (FDA approved for NASAL specimens only), is one component of a comprehensive MRSA colonization surveillance program. It is not intended to diagnose MRSA infection nor to guide or monitor treatment for MRSA infections. Performed at [facility], [place] [address], [place], [place] [address]      Radiology Studies: CT Head Wo Contrast  Result Date: 09/23/2020 CLINICAL DATA:  Encephalopathy EXAM: CT HEAD WITHOUT CONTRAST TECHNIQUE: Contiguous axial images were obtained from the base of the skull through the vertex without intravenous contrast. COMPARISON:  None. FINDINGS: Brain: There is no mass, hemorrhage or  extra-axial collection. The size and configuration of the ventricles and extra-axial CSF spaces are normal. There is hypoattenuation of the white matter, most commonly indicating chronic small vessel disease. Vascular: Atherosclerotic calcification of the internal carotid arteries at the skull base. No abnormal hyperdensity of the major intracranial arteries or dural venous sinuses. Skull: The visualized skull base, calvarium and extracranial soft tissues are normal. Sinuses/Orbits: Right maxillary sinus mucosal thickening. The orbits are normal. IMPRESSION: Chronic small vessel disease without acute intracranial abnormality. Electronically Signed   By: Ulyses Jarred M.D.   On: 09/23/2020 20:47   CT ANGIO CHEST PE W OR WO CONTRAST  Result Date: 09/24/2020 CLINICAL DATA:  History of syncope and ventricular tachycardia EXAM: CT  ANGIOGRAPHY CHEST WITH CONTRAST TECHNIQUE: Multidetector CT imaging of the chest was performed using the standard protocol during bolus administration of intravenous contrast. Multiplanar CT image reconstructions and MIPs were obtained to evaluate the vascular anatomy. CONTRAST:  72mL OMNIPAQUE IOHEXOL 350 MG/ML SOLN COMPARISON:  09/23/2020 FINDINGS: Cardiovascular: This is a technically adequate opacification of the pulmonary vasculature. No filling defects or pulmonary emboli. The heart is enlarged without pericardial effusion. Normal caliber of the thoracic aorta. Extensive atherosclerosis of the aorta and coronary vasculature. Postsurgical changes from previous CABG. Mediastinum/Nodes: No enlarged mediastinal, hilar, or axillary lymph nodes. Thyroid gland, trachea, and esophagus demonstrate no significant findings. Lungs/Pleura: There are moderate bilateral pleural effusions volume estimated less than 1 L each. Dense bilateral dependent lower lobe consolidation consistent with atelectasis. No pneumothorax. Central airways are patent. Upper Abdomen: No acute abnormality. Musculoskeletal:  There are minimally displaced acute right anterolateral fourth through seventh rib fractures, likely related to recent CPR. Numerous other prior bilateral healed rib fractures are also noted. No other acute bony abnormalities. Reconstructed images demonstrate no additional findings. Review of the MIP images confirms the above findings. IMPRESSION: 1. No evidence of pulmonary embolus. 2. Moderate bilateral pleural effusions with dense bilateral lower lobe atelectasis. 3. Right anterolateral fourth through seventh acute rib fractures consistent with recent CPR. 4.  Aortic Atherosclerosis (ICD10-I70.0). Electronically Signed   By: Randa Ngo M.D.   On: 09/24/2020 21:17   MR BRAIN WO CONTRAST  Result Date: 09/24/2020 CLINICAL DATA:  71-year-old female with recurrent syncope and confusion. EXAM: MRI HEAD WITHOUT CONTRAST TECHNIQUE: Multiplanar, multiecho pulse sequences of the brain and surrounding structures were obtained without intravenous contrast. COMPARISON:  Head CT 09/23/2020.  Brain MRI 07/13/2015. FINDINGS: Brain: No restricted diffusion to suggest acute infarction. No midline shift, mass effect, evidence of mass lesion, ventriculomegaly, extra-axial collection or acute intracranial hemorrhage. Cervicomedullary junction and pituitary are within normal limits. Mild generalized cerebral volume loss since 2016. But gray and white matter signal remains largely normal for age. No cortical encephalomalacia. But there are occasional chronic micro hemorrhages in the brain, including the posterior right frontal lobe (series 14, image 37) and left cerebellum (image 13). Furthermore, there is intrinsic T1 hyperintensity in the bilateral globus pallidus since 2016 (series 16, image 30 on the left. Vascular: Major intracranial vascular flow voids are stable since 2016. 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 Mild associated  visible cervical spinal stenosis, including at C2-C3. Heterogeneous bone marrow signal but within normal limits. Sinuses/Orbits: Negative orbits. Mild to moderate right maxillary sinus mucosal thickening is new, but other paranasal sinuses are improved compared to 2016. Other: Trace right mastoid fluid.  Negative visible nasopharynx. IMPRESSION: 1. Intrinsic T1 hyperintensity has developed in the bilateral globus pallidus since a 2016 MRI. This is most frequently associated with Hepatic Insufficiency / Cirrhosis. Correlation with serum Ammonia levels and liver function tests is recommended. 2. No other acute intracranial abnormality. Mild generalized cerebral volume loss since 2016 with occasional chronic micro hemorrhages in the brain. 3. 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 Electronically Signed   By: Genevie Ann M.D.   On: 09/24/2020 11:34   CARDIAC CATHETERIZATION  Result Date: 09/24/2020  -----------NATIVE VESSELS-------------------------  Ost LAD to Prox LAD lesion is 95% stenosed. Prox LAD lesion is 100% stenosed with 100% stenosed side branch in Ost 1st Diag.  Ost 1st Mrg lesion is 75% stenosed.  Prox Cx to Mid  Cx lesion is 100% stenosed (just after 1st Mrg) with side branch in Ost 2nd Mrg.  Prox RCA lesion is 95% stenosed. Mid RCA to Dist RCA lesion is 100% stenosed.  ------------GRAFTS--------------------------  SVG-RCA graft was visualized by angiography and is small. Origin to Mid Graft lesion is 100% stenosed. - Previously stented Graft  SVG-1stDiag graft was visualized by angiography. Dist Graft to Insertion lesion is 55% stenosed.  LIMA-dLAD graft was visualized by angiography and is normal in caliber. The graft exhibits no disease. There is no competitive flow  ---------------------------  There is mild left ventricular systolic dysfunction. The left ventricular ejection fraction is 45-50% by visual estimate.  LV end diastolic pressure  is mildly elevated.  There is a significant difference in arterial pressures versus cuff pressures-arterial pressures are at least 30- 40 mmHg higher.  SUMMARY  Severe native vessel CAD:  100% LAD following a 95% calcified lesion prior to SP1;  100% mid RCA following severe 80% stenosis,  100% AV groove LCx after OM1 with bridging collaterals to small PL branch,  75 to 80% calcified ostial OM1 (large bifurcating ramus-like vessel)  Grafts:  New lesion: 100% ostial and proximal CTO of SVG-RCA (was a very small caliber vessel previously stented.  The nature of this occlusion is such that it does not appear to be acute-the patient not actively having chest pain and no ST elevations, minimal troponin. => Likely not a reasonable target for PCI  SVG- 1st Diag has an anastomotic 50 to 60% stenosis of questionable significance (at this stage, the graft is the same caliber as a target vessel) ->  I chose not to attempt intervention on this location as she is hemodynamically clinically stable at this point. Would not want to complicate issues by potentially occluding another graft.  Patent LIMA-LAD with no left-to-right collaterals  Mildly reduced EF with inferobasal hypokinesis-45 to 50%.  Borderline elevated LVEDP of 15 mmHg  Significant discrepancy between aortic blood pressures and cuff blood pressures of at least 30 mmHg.  Cuff pressures are significantly lower. RECOMMENDATIONS  Return to CCU, would continue amiodarone load, consider lidocaine if VT recurs.  EP consultation to consider the possibility of ICD.  For future stabilization, could consider the possibility of intervention SVG-1st Diag (albeit a questionable significance), also atherectomy PCI of the OM1 (this is similar in appearance to previous cath in 2018, so this would only be to optimize distal flow - would likely need ischemic evaluation prior to proceeding with PCI on either lesion).  Prior to initiating pressor support in the ICU,  would consider invasive arterial monitoring   DG Chest Port 1 View  Result Date: 09/23/2020 CLINICAL DATA:  Ventricular tachycardia, cardiac arrest status post resuscitation EXAM: PORTABLE CHEST 1 VIEW COMPARISON:  04/10/2018 FINDINGS: Single frontal view of the chest demonstrates stable enlargement the cardiac silhouette, with postsurgical changes from median sternotomy and bypass surgery. Retrocardiac consolidation may reflect atelectasis, infection, or aspiration. Small left pleural effusion. No pneumothorax. No acute displaced fractures. Vascular stent left axillary region. IMPRESSION: 1. Left lower lobe consolidation may reflect atelectasis, infection, or aspiration. 2. Small left pleural effusion. Electronically Signed   By: Randa Ngo M.D.   On: 09/23/2020 19:21   ECHOCARDIOGRAM COMPLETE  Result Date: 09/24/2020    ECHOCARDIOGRAM REPORT   Patient Name:   [name] Date of Exam: 09/24/2020 Medical Rec #:  [id number]         Height:       64.0 in Accession #:  [id number]        Weight:       170.0 lb Date of Birth:  [birth year]          BSA:          1.826 m Patient Age:    89 years          BP:           84/65 mmHg Patient Gender: F                 HR:           52 bpm. Exam Location:  Inpatient Procedure: 2D Echo Indications:    cardiac arrest  History:        Patient has prior history of Echocardiogram examinations, most                 recent 05/31/2020. CAD, Prior CABG, end stage renal disease,                 Arrythmias:ventricular tachycardia; Signs/Symptoms:Syncope.  Sonographer:    Johny Chess Referring Phys: [place]  1. Patient had VT/VF arrest within minutes of starting echocardiogram. Remainder of study completed post cath, post arrest.  2. Left ventricular ejection fraction, by estimation, is 45%. The left ventricle has mildly decreased function. The left ventricle demonstrates regional wall motion abnormalities (see scoring diagram/findings for  description). Left ventricular diastolic  parameters are consistent with Grade II diastolic dysfunction (pseudonormalization).  3. Right ventricular systolic function is severely reduced. The right ventricular size is moderately enlarged. There is normal pulmonary artery systolic pressure. The estimated right ventricular systolic pressure is 82.9 mmHg.  4. Left atrial size was moderately dilated.  5. Right atrial size was moderately dilated.  6. The mitral valve is degenerative. Mild mitral valve regurgitation. No evidence of mitral stenosis.  7. The aortic valve is grossly normal. There is mild thickening of the aortic valve. Aortic valve regurgitation is not visualized. No aortic stenosis is present.  8. The inferior vena cava is normal in size with <50% respiratory variability, suggesting right atrial pressure of 8 mmHg. FINDINGS  Left Ventricle: Left ventricular ejection fraction, by estimation, is 45%. The left ventricle has mildly decreased function. The left ventricle demonstrates regional wall motion abnormalities. The left ventricular internal cavity size was normal in size. There is no left ventricular hypertrophy. Left ventricular diastolic parameters are consistent with Grade II diastolic dysfunction (pseudonormalization).  LV Wall Scoring: The basal inferolateral segment and basal inferior segment are akinetic. Right Ventricle: The right ventricular size is moderately enlarged. Right vetricular wall thickness was not well visualized. Right ventricular systolic function is severely reduced. There is normal pulmonary artery systolic pressure. The tricuspid regurgitant velocity is 2.27 m/s, and with an assumed right atrial pressure of 8 mmHg, the estimated right ventricular systolic pressure is 56.2 mmHg. Left Atrium: Left atrial size was moderately dilated. Right Atrium: Right atrial size was moderately dilated. Pericardium: There is no evidence of pericardial effusion. Mitral Valve: The mitral valve is  degenerative in appearance. Mild mitral annular calcification. Mild mitral valve regurgitation. No evidence of mitral valve stenosis. Tricuspid Valve: The tricuspid valve is normal in structure. Tricuspid valve regurgitation is mild. Aortic Valve: The aortic valve is grossly normal. There is mild thickening of the aortic valve. Aortic valve regurgitation is not visualized. No aortic stenosis is present. Pulmonic Valve: The pulmonic valve was not well visualized. Pulmonic valve regurgitation is trivial. Aorta: The aortic root  is normal in size and structure. Venous: The inferior vena cava is normal in size with less than 50% respiratory variability, suggesting right atrial pressure of 8 mmHg. IAS/Shunts: No atrial level shunt detected by color flow Doppler.  LEFT VENTRICLE PLAX 2D LVIDd:         4.40 cm Diastology LVIDs:         3.60 cm LV e' medial:    5.77 cm/s LV PW:         0.90 cm LV E/e' medial:  24.4 LV IVS:        0.70 cm LV e' lateral:   7.51 cm/s                        LV E/e' lateral: 18.8  RIGHT VENTRICLE            IVC RV S prime:     4.35 cm/s  IVC diam: 1.80 cm LEFT ATRIUM             Index       RIGHT ATRIUM           Index LA diam:        3.60 cm 1.97 cm/m  RA Area:     16.20 cm LA Vol (A2C):   71.5 ml 39.16 ml/m RA Volume:   41.10 ml  22.51 ml/m LA Vol (A4C):   34.5 ml 18.90 ml/m LA Biplane Vol: 51.6 ml 28.26 ml/m  AORTIC VALVE LVOT Vmax:   84.90 cm/s LVOT Vmean:  57.800 cm/s LVOT VTI:    0.221 m  AORTA Ao Asc diam: 2.30 cm MITRAL VALVE                TRICUSPID VALVE MV Area (PHT): 2.87 cm     TR Peak grad:   20.6 mmHg MV Decel Time: 264 msec     TR Vmax:        227.00 cm/s MV E velocity: 141.00 cm/s MV A velocity: 80.80 cm/s   SHUNTS MV E/A ratio:  1.75         Systemic VTI: 0.22 m Cherlynn Kaiser MD Electronically signed by Cherlynn Kaiser MD Signature Date/Time: 09/24/2020/4:44:35 PM    Final     Scheduled Meds: . allopurinol  100 mg Oral q AM  . atorvastatin  40 mg Oral QHS  .  brimonidine  1 drop Right Eye QHS   And  . brimonidine  2 drop Left Eye QHS  . calcium carbonate  1 tablet Oral Q lunch  . Chlorhexidine Gluconate Cloth  6 each Topical Daily  . Chlorhexidine Gluconate Cloth  6 each Topical Q0600  . [START ON 09/26/2020] cinacalcet  30 mg Oral Once per day on Tue Thu  . citalopram  20 mg Oral q AM  . clopidogrel  75 mg Oral q AM  . feeding supplement (NEPRO CARB STEADY)  237 mL Oral TID BM  . insulin aspart  0-5 Units Subcutaneous QHS  . insulin aspart  0-6 Units Subcutaneous TID WC  . latanoprost  1 drop Both Eyes QHS  . multivitamin  1 tablet Oral QHS  . polycarbophil  625 mg Oral BID WC  . sevelamer carbonate  1,600 mg Oral BID WC  . sodium chloride flush  3 mL Intravenous Q12H  . sodium chloride flush  3 mL Intravenous Q12H  . sodium chloride flush  3 mL Intravenous Q12H  . timolol  1 drop Left Eye QHS  Continuous Infusions: . sodium chloride    . sodium chloride Stopped (09/25/20 1025)  . sodium chloride    . sodium chloride    . amiodarone 30 mg/hr (09/25/20 1200)     LOS: 2 days   Marylu Lund, MD [facility] Pager On Amion  If 7PM-7AM, please contact night-coverage 09/25/2020, 6:06 PM

## 2020-09-25 NOTE — Progress Notes (Addendum)
Progress Note  Patient Name: Alfredo Collymore Date of Encounter: 09/25/2020  Primary Cardiologist: Larae Grooms, MD   Subjective   Very sleepy, says her breathing is okay.  Just has lots of pain in her back and chest as well as shoulder from CPR.  Inpatient Medications    Scheduled Meds: . acidophilus  1 capsule Oral QPM  . allopurinol  100 mg Oral q AM  . atorvastatin  40 mg Oral QHS  . brimonidine  1 drop Right Eye QHS   And  . brimonidine  2 drop Left Eye QHS  . calcium carbonate  1 tablet Oral Q lunch  . Chlorhexidine Gluconate Cloth  6 each Topical Daily  . Chlorhexidine Gluconate Cloth  6 each Topical Q0600  . [START ON 09/26/2020] cinacalcet  30 mg Oral Once per day on Tue Thu  . citalopram  20 mg Oral q AM  . clopidogrel  75 mg Oral q AM  . heparin  5,000 Units Subcutaneous Q8H  . insulin aspart  0-5 Units Subcutaneous QHS  . insulin aspart  0-6 Units Subcutaneous TID WC  . latanoprost  1 drop Both Eyes QHS  . polycarbophil  625 mg Oral BID WC  . sevelamer carbonate  1,600 mg Oral BID WC  . sodium chloride flush  3 mL Intravenous Q12H  . sodium chloride flush  3 mL Intravenous Q12H  . sodium chloride flush  3 mL Intravenous Q12H  . timolol  1 drop Left Eye QHS  . vitamin B-12  1,000 mcg Oral q AM   Continuous Infusions: . sodium chloride    . sodium chloride 10 mL/hr at 09/25/20 0900  . sodium chloride    . sodium chloride     PRN Meds: sodium chloride, sodium chloride, acetaminophen, famotidine, lidocaine-prilocaine, morphine injection, nitroGLYCERIN, oxyCODONE-acetaminophen, sodium chloride flush, sodium chloride flush   Vital Signs    Vitals:   09/25/20 0400 09/25/20 0500 09/25/20 0600 09/25/20 0738  BP:      Pulse: (!) 40 (!) 39 (!) 41   Resp: _0 Temp:    98.2 F (36.8 C)  TempSrc:    Axillary  SpO2: 91% 97% 98%   Weight:  79.9 kg    Height:        Intake/Output Summary (Last 24 hours) at 09/25/2020 1039 Last data filed at  09/25/2020 0900 Gross per 24 hour  Intake 1374.91 ml  Output -  Net 1374.91 ml   Filed Weights   09/23/20 1858 09/25/20 0500  Weight: 77.1 kg 79.9 kg    Telemetry    Yesterday was sinus rhythm PVC rates started dropping from the 80s until the 50s by midnight.  The by this morning, rates were in the low 40s to high 30s; sinus pauses noted with coughing or bearing down or other vagal maneuvers.- Personally Reviewed  ECG    No new EKG- Personally Reviewed  Physical Exam   GEN:  Very hard of hearing.  Seems quite fatigued and lethargic.  In quite a bit of pain from rib fractures, shoulder pain. Neck: Noobvious JVD  Cardiac:  Bradycardic with normal rhythm.  Normal S1 and S2.  No obvious M/R/G. Respiratory: Clear to auscultation bilaterally in anterior lung fields, but poor respiratory effort.Marland Kitchen GI: Soft, nontender, non-distended-NABS. MS:  Minimal trace edema, right shoulder pain Neuro:   Somewhat sedate/very fatigued. Psych: Is awake, and alert, oriented x3.  Labs    Chemistry Recent Labs  Lab 09/23/20  1937 09/24/20 0315 09/24/20 1059 09/25/20 0424  NA 137  --  139 133*  K 3.3*  --  3.7 4.6  CL 93*  --  94* 90*  CO2 34*  --  34* 30  GLUCOSE 200*  --  157* 194*  BUN 7*  --  12 19  CREATININE 2.74* 3.09* 3.62* 4.46*  CALCIUM 7.7*  --  8.1* 8.5*  PROT 6.2*  --   --   --   ALBUMIN 3.0*  --   --   --   AST 43*  --   --   --   ALT 27  --   --   --   ALKPHOS 75  --   --   --   BILITOT 0.6  --   --   --   GFRNONAA 17* 15* 12* 9*  ANIONGAP 10  --  11 13     Hematology Recent Labs  Lab 09/23/20 1937 09/24/20 0315 09/25/20 0424  WBC 5.1 6.1 8.1  RBC 3.25* 3.23* 3.09*  HGB 11.0* 11.0* 10.3*  HCT 34.5* 34.2* 33.7*  MCV 106.2* 105.9* 109.1*  MCH 33.8 34.1* 33.3  MCHC 31.9 32.2 30.6  RDW 15.0 15.1 15.8*  PLT 129* 123* 129*    Cardiac EnzymesNo results for input(s): TROPONINI in the last 168 hours. No results for input(s): TROPIPOC in the last 168 hours.   BNPNo  results for input(s): BNP, PROBNP in the last 168 hours.   DDimer No results for input(s): DDIMER in the last 168 hours.   Radiology    CT Head Wo Contrast  Result Date: 09/23/2020 CLINICAL DATA:  Encephalopathy EXAM: CT HEAD WITHOUT CONTRAST TECHNIQUE: Contiguous axial images were obtained from the base of the skull through the vertex without intravenous contrast. COMPARISON:  None. FINDINGS: Brain: There is no mass, hemorrhage or extra-axial collection. The size and configuration of the ventricles and extra-axial CSF spaces are normal. There is hypoattenuation of the white matter, most commonly indicating chronic small vessel disease. Vascular: Atherosclerotic calcification of the internal carotid arteries at the skull base. No abnormal hyperdensity of the major intracranial arteries or dural venous sinuses. Skull: The visualized skull base, calvarium and extracranial soft tissues are normal. Sinuses/Orbits: Right maxillary sinus mucosal thickening. The orbits are normal. IMPRESSION: Chronic small vessel disease without acute intracranial abnormality. Electronically Signed   By: Ulyses Jarred M.D.   On: 09/23/2020 20:47   CT ANGIO CHEST PE W OR WO CONTRAST  Result Date: 09/24/2020 CLINICAL DATA:  History of syncope and ventricular tachycardia EXAM: CT ANGIOGRAPHY CHEST WITH CONTRAST TECHNIQUE: Multidetector CT imaging of the chest was performed using the standard protocol during bolus administration of intravenous contrast. Multiplanar CT image reconstructions and MIPs were obtained to evaluate the vascular anatomy. CONTRAST:  42m OMNIPAQUE IOHEXOL 350 MG/ML SOLN COMPARISON:  09/23/2020 FINDINGS: Cardiovascular: This is a technically adequate opacification of the pulmonary vasculature. No filling defects or pulmonary emboli. The heart is enlarged without pericardial effusion. Normal caliber of the thoracic aorta. Extensive atherosclerosis of the aorta and coronary vasculature. Postsurgical changes  from previous CABG. Mediastinum/Nodes: No enlarged mediastinal, hilar, or axillary lymph nodes. Thyroid gland, trachea, and esophagus demonstrate no significant findings. Lungs/Pleura: There are moderate bilateral pleural effusions volume estimated less than 1 L each. Dense bilateral dependent lower lobe consolidation consistent with atelectasis. No pneumothorax. Central airways are patent. Upper Abdomen: No acute abnormality. Musculoskeletal: There are minimally displaced acute right anterolateral fourth through seventh rib fractures, likely related  to recent CPR. Numerous other prior bilateral healed rib fractures are also noted. No other acute bony abnormalities. Reconstructed images demonstrate no additional findings. Review of the MIP images confirms the above findings. IMPRESSION: 1. No evidence of pulmonary embolus. 2. Moderate bilateral pleural effusions with dense bilateral lower lobe atelectasis. 3. Right anterolateral fourth through seventh acute rib fractures consistent with recent CPR. 4.  Aortic Atherosclerosis (ICD10-I70.0). Electronically Signed   By: Randa Ngo M.D.   On: 09/24/2020 21:17   MR BRAIN WO CONTRAST  Result Date: 09/24/2020 CLINICAL DATA:  50-year-old female with recurrent syncope and confusion. EXAM: MRI HEAD WITHOUT CONTRAST TECHNIQUE: Multiplanar, multiecho pulse sequences of the brain and surrounding structures were obtained without intravenous contrast. COMPARISON:  Head CT 09/23/2020.  Brain MRI 07/13/2015. FINDINGS: Brain: No restricted diffusion to suggest acute infarction. No midline shift, mass effect, evidence of mass lesion, ventriculomegaly, extra-axial collection or acute intracranial hemorrhage. Cervicomedullary junction and pituitary are within normal limits. Mild generalized cerebral volume loss since 2016. But gray and white matter signal remains largely normal for age. No cortical encephalomalacia. But there are occasional chronic micro hemorrhages in the brain,  including the posterior right frontal lobe (series 14, image 37) and left cerebellum (image 13). Furthermore, there is intrinsic T1 hyperintensity in the bilateral globus pallidus since 2016 (series 16, image 30 on the left. Vascular: Major intracranial vascular flow voids are stable since 2016. 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 Mild associated visible cervical spinal stenosis, including at C2-C3. Heterogeneous bone marrow signal but within normal limits. Sinuses/Orbits: Negative orbits. Mild to moderate right maxillary sinus mucosal thickening is new, but other paranasal sinuses are improved compared to 2016. Other: Trace right mastoid fluid.  Negative visible nasopharynx. IMPRESSION: 1. Intrinsic T1 hyperintensity has developed in the bilateral globus pallidus since a 2016 MRI. This is most frequently associated with Hepatic Insufficiency / Cirrhosis. Correlation with serum Ammonia levels and liver function tests is recommended. 2. No other acute intracranial abnormality. Mild generalized cerebral volume loss since 2016 with occasional chronic micro hemorrhages in the brain. 3. 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 Electronically Signed   By: Genevie Ann M.D.   On: 09/24/2020 11:34   CARDIAC CATHETERIZATION  Result Date: 09/24/2020  -----------NATIVE VESSELS-------------------------  Ost LAD to Prox LAD lesion is 95% stenosed. Prox LAD lesion is 100% stenosed with 100% stenosed side branch in Ost 1st Diag.  Ost 1st Mrg lesion is 75% stenosed.  Prox Cx to Mid Cx lesion is 100% stenosed (just after 1st Mrg) with side branch in Ost 2nd Mrg.  Prox RCA lesion is 95% stenosed. Mid RCA to Dist RCA lesion is 100% stenosed.  ------------GRAFTS--------------------------  SVG-RCA graft was visualized by angiography and is small. Origin to Mid Graft lesion is 100%  stenosed. - Previously stented Graft  SVG-1stDiag graft was visualized by angiography. Dist Graft to Insertion lesion is 55% stenosed.  LIMA-dLAD graft was visualized by angiography and is normal in caliber. The graft exhibits no disease. There is no competitive flow  ---------------------------  There is mild left ventricular systolic dysfunction. The left ventricular ejection fraction is 45-50% by visual estimate.  LV end diastolic pressure is mildly elevated.  There is a significant difference in arterial pressures versus cuff pressures-arterial pressures are at least 30- 40 mmHg higher.  SUMMARY  Severe native vessel CAD:  100% LAD following a 95% calcified lesion prior to  SP1;  100% mid RCA following severe 80% stenosis,  100% AV groove LCx after OM1 with bridging collaterals to small PL branch,  75 to 80% calcified ostial OM1 (large bifurcating ramus-like vessel)  Grafts:  New lesion: 100% ostial and proximal CTO of SVG-RCA (was a very small caliber vessel previously stented.  The nature of this occlusion is such that it does not appear to be acute-the patient not actively having chest pain and no ST elevations, minimal troponin. => Likely not a reasonable target for PCI  SVG- 1st Diag has an anastomotic 50 to 60% stenosis of questionable significance (at this stage, the graft is the same caliber as a target vessel) ->  I chose not to attempt intervention on this location as she is hemodynamically clinically stable at this point. Would not want to complicate issues by potentially occluding another graft.  Patent LIMA-LAD with no left-to-right collaterals  Mildly reduced EF with inferobasal hypokinesis-45 to 50%.  Borderline elevated LVEDP of 15 mmHg  Significant discrepancy between aortic blood pressures and cuff blood pressures of at least 30 mmHg.  Cuff pressures are significantly lower. RECOMMENDATIONS  Return to CCU, would continue amiodarone load, consider lidocaine if VT recurs.  EP  consultation to consider the possibility of ICD.  For future stabilization, could consider the possibility of intervention SVG-1st Diag (albeit a questionable significance), also atherectomy PCI of the OM1 (this is similar in appearance to previous cath in 2018, so this would only be to optimize distal flow - would likely need ischemic evaluation prior to proceeding with PCI on either lesion).  Prior to initiating pressor support in the ICU, would consider invasive arterial monitoring   DG Chest Port 1 View  Result Date: 09/23/2020 CLINICAL DATA:  Ventricular tachycardia, cardiac arrest status post resuscitation EXAM: PORTABLE CHEST 1 VIEW COMPARISON:  04/10/2018 FINDINGS: Single frontal view of the chest demonstrates stable enlargement the cardiac silhouette, with postsurgical changes from median sternotomy and bypass surgery. Retrocardiac consolidation may reflect atelectasis, infection, or aspiration. Small left pleural effusion. No pneumothorax. No acute displaced fractures. Vascular stent left axillary region. IMPRESSION: 1. Left lower lobe consolidation may reflect atelectasis, infection, or aspiration. 2. Small left pleural effusion. Electronically Signed   By: Randa Ngo M.D.   On: 09/23/2020 19:21   ECHOCARDIOGRAM COMPLETE  Result Date: 09/24/2020    ECHOCARDIOGRAM REPORT   Patient Name:   Yudith MAE Perreira Date of Exam: 09/24/2020 Medical Rec #:  [id number]         Height:       64.0 in Accession #:    [id number]        Weight:       170.0 lb Date of Birth:  07-07-1941          BSA:          1.826 m Patient Age:    28 years          BP:           84/65 mmHg Patient Gender: F                 HR:           52 bpm. Exam Location:  Inpatient Procedure: 2D Echo Indications:    cardiac arrest  History:        Patient has prior history of Echocardiogram examinations, most                 recent 05/31/2020. CAD, Prior CABG, end  stage renal disease,                 Arrythmias:ventricular tachycardia;  Signs/Symptoms:Syncope.  Sonographer:    Johny Chess Referring Phys: [place]  1. Patient had VT/VF arrest within minutes of starting echocardiogram. Remainder of study completed post cath, post arrest.  2. Left ventricular ejection fraction, by estimation, is 45%. The left ventricle has mildly decreased function. The left ventricle demonstrates regional wall motion abnormalities (see scoring diagram/findings for description). Left ventricular diastolic  parameters are consistent with Grade II diastolic dysfunction (pseudonormalization).  3. Right ventricular systolic function is severely reduced. The right ventricular size is moderately enlarged. There is normal pulmonary artery systolic pressure. The estimated right ventricular systolic pressure is 31.4 mmHg.  4. Left atrial size was moderately dilated.  5. Right atrial size was moderately dilated.  6. The mitral valve is degenerative. Mild mitral valve regurgitation. No evidence of mitral stenosis.  7. The aortic valve is grossly normal. There is mild thickening of the aortic valve. Aortic valve regurgitation is not visualized. No aortic stenosis is present.  8. The inferior vena cava is normal in size with <50% respiratory variability, suggesting right atrial pressure of 8 mmHg. FINDINGS  Left Ventricle: Left ventricular ejection fraction, by estimation, is 45%. The left ventricle has mildly decreased function. The left ventricle demonstrates regional wall motion abnormalities. The left ventricular internal cavity size was normal in size. There is no left ventricular hypertrophy. Left ventricular diastolic parameters are consistent with Grade II diastolic dysfunction (pseudonormalization).  LV Wall Scoring: The basal inferolateral segment and basal inferior segment are akinetic. Right Ventricle: The right ventricular size is moderately enlarged. Right vetricular wall thickness was not well visualized. Right ventricular systolic  function is severely reduced. There is normal pulmonary artery systolic pressure. The tricuspid regurgitant velocity is 2.27 m/s, and with an assumed right atrial pressure of 8 mmHg, the estimated right ventricular systolic pressure is 97.0 mmHg. Left Atrium: Left atrial size was moderately dilated. Right Atrium: Right atrial size was moderately dilated. Pericardium: There is no evidence of pericardial effusion. Mitral Valve: The mitral valve is degenerative in appearance. Mild mitral annular calcification. Mild mitral valve regurgitation. No evidence of mitral valve stenosis. Tricuspid Valve: The tricuspid valve is normal in structure. Tricuspid valve regurgitation is mild. Aortic Valve: The aortic valve is grossly normal. There is mild thickening of the aortic valve. Aortic valve regurgitation is not visualized. No aortic stenosis is present. Pulmonic Valve: The pulmonic valve was not well visualized. Pulmonic valve regurgitation is trivial. Aorta: The aortic root is normal in size and structure. Venous: The inferior vena cava is normal in size with less than 50% respiratory variability, suggesting right atrial pressure of 8 mmHg. IAS/Shunts: No atrial level shunt detected by color flow Doppler.  LEFT VENTRICLE PLAX 2D LVIDd:         4.40 cm Diastology LVIDs:         3.60 cm LV e' medial:    5.77 cm/s LV PW:         0.90 cm LV E/e' medial:  24.4 LV IVS:        0.70 cm LV e' lateral:   7.51 cm/s                        LV E/e' lateral: 18.8  RIGHT VENTRICLE            IVC RV S prime:  4.35 cm/s  IVC diam: 1.80 cm LEFT ATRIUM             Index       RIGHT ATRIUM           Index LA diam:        3.60 cm 1.97 cm/m  RA Area:     16.20 cm LA Vol (A2C):   71.5 ml 39.16 ml/m RA Volume:   41.10 ml  22.51 ml/m LA Vol (A4C):   34.5 ml 18.90 ml/m LA Biplane Vol: 51.6 ml 28.26 ml/m  AORTIC VALVE LVOT Vmax:   84.90 cm/s LVOT Vmean:  57.800 cm/s LVOT VTI:    0.221 m  AORTA Ao Asc diam: 2.30 cm MITRAL VALVE                 TRICUSPID VALVE MV Area (PHT): 2.87 cm     TR Peak grad:   20.6 mmHg MV Decel Time: 264 msec     TR Vmax:        227.00 cm/s MV E velocity: 141.00 cm/s MV A velocity: 80.80 cm/s   SHUNTS MV E/A ratio:  1.75         Systemic VTI: 0.22 m Cherlynn Kaiser MD Electronically signed by Cherlynn Kaiser MD Signature Date/Time: 09/24/2020/4:44:35 PM    Final     Cardiac Studies    Echo 09/24/2020 summary: EF 45%.  Basal inferolateral and basal inferior akinesis (consistent with occluded SVG-RCA).  Moderate enlarged RV with severely dysfunction.  Moderate biatrial enlargement.  MAC noted but no stenosis and mild MR.  Mild aortic sclerosis but no stenosis.   Cardiac cath 09/24/2020:   Severe native vessel CAD:  ? 100% LAD following a 95% calcified lesion prior to SP1;  ? 100% mid RCA following severe 80% stenosis,  ? 100% AV groove LCx after OM1 with bridging collaterals to small PL branch,   75 to 80% calcified ostial OM1 (large bifurcating ramus-like vessel)  Grafts: ? New lesion: 100% ostial and proximal CTO of SVG-RCA (was a very small caliber vessel previously stented.  The nature of this occlusion is such that it does not appear to be acute-the patient not actively having chest pain and no ST elevations, minimal troponin. => Likely not a reasonable target for PCI ? SVG- 1st Diag has an anastomotic 50 to 60% stenosis of questionable significance (at this stage, the graft is the same caliber as a target vessel) ->   I chose not to attempt intervention on this location as she is hemodynamically clinically stable at this point. Would not want to complicate issues by potentially occluding another graft. ? Patent LIMA-LAD with no left-to-right collaterals  Mildly reduced EF with inferobasal hypokinesis-45 to 50%.  Borderline elevated LVEDP of 15 mmHg  Significant discrepancy between aortic blood pressures and cuff blood pressures of at least 30 mmHg.  Cuff pressures are significantly  lower.     Patient Profile     6F with CAD s/p CABG x3 (LIMA-LAD, SVG-diag, SVG-RCA), prior VF arrest in the setting of SVG occlusion , ESRD on iHD, post op AF, TIA, CAS s/p L CAE (2009), MDD, DM2, diverticulosis, GERD, glaucoma, and HTN who presents following recurrent LOC and subsequent witnessed arrest.  Recurrent VT on 09/24/2020-resuscitated with 90 seconds of CPR, no shock.  Amiodarone drip continued along with lidocaine.  Cardiac cath revealed likely subacute occlusion of SVG-RCA.  Not PCI target.  No other clear targets to explain ischemic VT.  Assessment &  Plan   Principal Problem:   Ventricular tachycardia (HCC) Active Problems:   Paroxysmal atrial fibrillation (HCC)   Coronary artery disease involving native heart without angina pectoris   Coronary artery disease involving autologous vein bypass graft   Demand ischemia of myocardium (HCC)   Hyperlipidemia associated with type 2 diabetes mellitus ([place])   Essential hypertension   Hypokalemia   QT prolongation   ESRD on dialysis ([place])   Anemia of chronic disease   Diabetes mellitus type 2 in nonobese (HCC)   Syncope and collapse   - CAD s/p CABG with prior VT/VF arrest with cath showing occluded SVG requiring PCI in 2019.  Echo with mildly reduced EF of 45%.  Severely reduced RV function.  Moderate biatrial enlargement.  Mildly elevated right atrial pressures.  Principal Problem:   Ventricular tachycardia ([place]) as etiology for Syncope and collapse - concern for Ischemic VT - EMS notes indicate WCT rate 200, terminating prior to defibrillation. She did require CPR.   Recurrent event yesterday at roughly 11:30 AM.  Had 3-4 beats of V. tach that degenerated into pulseless VT -=> CPR for roughly 90seconds with no defibrillation.  No epinephrine. ->  Patient was not complaining of angina either on admission or during this occasion.  Not had angina in the past week.  Urgent cath yesterday revealed occlusion of the SVG-RCA,  likely subacute based on appearance.  (The patient did not present with anginal symptoms.) - > suspect subacute occlusion of the vein graft.  No other acute lesions.  Was reloaded amiodarone postarrest, and then run at a increased rate for a few hours yesterday.  Also started on lidocaine.  Heart rate this morning in the high 30s to low 40s with pauses during vagal events. => We will DC both lidocaine and IV amiodarone at this point.  May restart oral amiodarone 4 mg twice daily starting this evening, if heart rate is stable. Initial troponin elevation was minimal, there was pretty significant elevation following her arrest, suspect that this is all post arrest related and not ACS.  She never had anginal symptoms. - Trop 20>>30.   I have placed EP consultation--probably not a good ICD candidate given her age, hemodialysis with bilateral fistulae.  Need to know best medical management if no ICD.  Question of QT prolongation.  Pain medications for post CPR per [facility].   Active Problems:      Coronary artery disease involving native heart without angina pectoris /   Coronary artery disease involving autologous vein bypass graft  Relook cath showed subacute occlusion of SVG-RCA (this was a very small caliber graft to minimal distal RCA.  There is diffuse disease when a stent was placed.  By appearance it does not seem to be acute in nature, regardless would not be a PCI target). ->  The fact that she did not present with angina with me that this is probably not acute.  Wall motion on echo is consistent with these findings.  Troponin elevation is most consistent with demand ischemia from cardiac arrest with existing CAD not likely ACS.  Would not continue with heparin  She is on Plavix without aspirin, along with moderate dose atorvastatin.  No beta-blocker as she is now on amiodarone and now bradycardic.  Blood pressures have been relatively soft, therefore not on additional medications.      Hyperlipidemia associated with type 2 diabetes mellitus ([place]) -Per [facility], on standing insulin with sliding scale.  On atorvastatin.   Essential hypertension -not  on any antihypertensives, blood pressure stable. - BP unreliable measured from leg, left femoral arterial line placed overnight by fellow for both blood draws and pressure monitoring.       ESRD on dialysis (HCC)/ Hypokalemia  Need to maintain potassium over 4 magnesium over 2.  Is due for dialysis on Tuesday.     Paroxysmal atrial fibrillation (HCC) -was postop.  No recurrence seen on monitor.  Not on DOAC.   Anemia of chronic disease -> stable   Diabetes mellitus type 2 in nonobese [facility])    For questions or updates, please contact [place] Please consult [email or web address] for contact info under        Signed, [name], MD  09/25/2020, 10:39 AM

## 2020-09-25 NOTE — Consult Note (Addendum)
Cardiology Consultation:   Patient ID: [name] MRN: [id number]; DOB: 1940/11/22  Admit date: 09/23/2020 Date of Consult: 09/25/2020  PCP:  Josetta Huddle, MD   [place]  Cardiologist:  Larae Grooms, MD    :[phone]}    Patient Profile:   [name] is a 80 y.o. 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 Ellyn Hack.  Known hx of steal syndrome LUE,  functioning left upper arm access but mild steal symptoms.  underwent new access in the right arm so that ultimately the access on the left could be ligated. This was done 07/03/20  History of Present Illness:   Ms. Kurkowski last saw [place] October 2021, planned to update her echo, she was pending new fistula placement 2/2 steal syndrome on left, no new cardiac issues.  She was admitted late 3/12 (overnight) with recurrent syncope. , family started CPR perhaps with brief awakening then unconscious againa and CPR resumed by family, EMS was called and by record had VT that spontaneously resolved prior to needing a shock, started on lidocaine gtt by EMS Once here started on amiodarone gtt along with the lidocaine. (both continue)  Yesterday she had VF arrest, she got CPR, no defibrillations or epi, rebolused with 300mg  amio and rate increased. brought emergently to cath lab New occluded SVG to RCA (prox to prior stented segment) though not felt to have happened acutely with no reports of CP and minimal HS trop elevation. SVG to 1st diag with 5-60% lesion of questionable significance and not intervened on. Dr. Ellyn Hack recommended  EP consultation to consider the possibility of ICD.  For future stabilization, could consider the possibility of  intervention SVG-1st Diag (albeit a questionable significance), also atherectomy PCI of the OM1 (this is similar in appearance to previous cath in 2018, so this would only be to optimize distal flow - would likely need ischemic evaluation prior to proceeding with PCI on either lesion).  LVEF 45-50% He also observed 50mmHg discrpency between art line BP and cuff, recommended art line confirmation of BP prior to using pressor  TTE noted LVEF 45%, + WMA, basal inflat and inf segments are akinetic RVEF severely reduced, mod enlarged  LABS K+ 3.3 > 3.7 > 4.6 Mag 1.7 > 1.8 > 2.7 this AM BUN/Creat 7/2.74 > 3.09 >> 12/3.62 >> 19/4.46 this AM HS Trop 20 > 30 > 475 > 1923 WBC 8.1 H/H 10.3/33 Plts 129  TSH 2.440  Lidocaine level this AM 3.8  Current AADs Lidocaine Gtt @ 1 > stopped this AM amio gtt back to 30mg  > stopped this AM No betablocker  Nephrology on board HD Tues/Th/Sat  She is tired this morning, generally feeling weak, though AAO Visiting with her daughter and grand daughter at bedside    Past Medical History:  Diagnosis Date  . Anemia   . Anxiety   . Arthritis   . Atrial fibrillation ([place])   . Blood transfusion   . C. difficile diarrhea   . CAD (coronary artery disease)   . Carotid artery occlusion    Carotid Endartectom,y - left 2009.  Blockage Right being watched by Dr Scot Dock.  . Carotid stenosis   .  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 Complication of anesthesia    pt. states that she was difficult to wake  . Depression   . Diabetes mellitus without complication ([place])   . Diverticulosis   . Dysrhythmia   . General weakness 12/2015  . GERD (gastroesophageal reflux disease)   . Glaucoma   . History of hiatal hernia   . History of kidney stones    passed  . History of pneumonia   . HOH (hard of hearing)   . Hypertension   . Hypokalemia 12/2015  . Melanoma ([place])    .  top of head- melonoma  . Myocardial infarction ([place])   .  Neuromuscular disorder ([place])    CARPEL TUNNEL  . Pneumonia   . SAH (subarachnoid hemorrhage) ([place]) 01/25/2017   bifrontal SAH following fall ([place])  . Shortness of breath   . 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 Stroke ([place])    hx of TIA    Past Surgical History:  Procedure Laterality Date  . A/V FISTULAGRAM N/A 07/21/2017   Procedure: A/V FISTULAGRAM - Left Arm;  Surgeon: Angelia Mould, MD;  Location: [facility];  Service: Cardiovascular;  Laterality: N/A;  . A/V FISTULAGRAM Left 03/17/2020   Procedure: A/V Fistulagram;  Surgeon: Angelia Mould, MD;  Location: [facility];  Service: Cardiovascular;  Laterality: Left;  arm  . AV FISTULA PLACEMENT Left 05/31/2014   Procedure: Creation of Left Arm arteriovenous brachiocephalic Fistula;  Surgeon: Angelia Mould, MD;  Location: [facility];  Service: Vascular;  Laterality: Left;  . AV FISTULA PLACEMENT Left 09/01/2014   Procedure: INSERTION OF ARTERIOVENOUS (AV) GORE-TEX GRAFT LEFT UPPER ARM USING  4-7 MM X 45 CM SRTETCH GORETEX GRAFT;  Surgeon: Angelia Mould, MD;  Location: [facility];  Service: Vascular;  Laterality: Left;  . BACK SURGERY    . BASCILIC VEIN TRANSPOSITION Right 07/03/2020   Procedure: BASCILIC VEIN TRANSPOSITION;  Surgeon: Angelia Mould, MD;  Location: [facility];  Service: Vascular;  Laterality: Right;  . CARDIAC CATHETERIZATION    . CAROTID ENDARTERECTOMY Left 2009    CEA  . carpel tunnel    . COLONOSCOPY  07/25/2011   Procedure: COLONOSCOPY;  Surgeon: Winfield Cunas., MD;  Location: [facility] ENDOS[facility];  Service: Endoscopy;  Laterality: N/A;  . CORONARY ARTERY BYPASS GRAFT  07/29/2011   Procedure: CORONARY ARTERY BYPASS GRAFTING (CABG);  Surgeon: Gaye Pollack, MD;  Location: [facility] [place];  Service: Open Heart Surgery;  Laterality: N/A;  . CORONARY STENT INTERVENTION N/A 04/08/2018   Procedure: CORONARY STENT INTERVENTION;  Surgeon: Jettie Booze, MD;  Location: [facility]  [facility];  Service: Cardiovascular;  Laterality: N/A;  . ENDARTERECTOMY Right 04/21/2015   Procedure: ENDARTERECTOMY CAROTID;  Surgeon: Angelia Mould, MD;  Location: [place];  Service: Vascular;  Laterality: Right;  . ESOPHAGOGASTRODUODENOSCOPY  07/25/2011   Procedure: ESOPHAGOGASTRODUODENOSCOPY (EGD);  Surgeon: Winfield Cunas., MD;  Location: [facility] ENDOSCOPY;  Service: Endoscopy;  Laterality: N/A;  . EYE SURGERY    . FISTULOGRAM Left 08/29/2014   Procedure: FISTULOGRAM;  Surgeon: Angelia Mould, MD;  Location: [facility] CATH LAB;  Service: Cardiovascular;  Laterality: Left;  . KNEE SURGERY Left   . LEFT HEART CATH AND CORS/GRAFTS ANGIOGRAPHY N/A 04/08/2018   Procedure: LEFT HEART CATH AND CORS/GRAFTS ANGIOGRAPHY;  Surgeon: Jettie Booze, MD;  Location: [place] [facility];  Service: Cardiovascular;  Laterality: N/A;  . LEFT HEART CATH AND CORS/GRAFTS ANGIOGRAPHY N/A 09/24/2020   Procedure: LEFT HEART CATH AND CORS/GRAFTS ANGIOGRAPHY;  Surgeon: Leonie Man, MD;  Location: [place] [facility];  Service: Cardiovascular;  Laterality: N/A;  . LUMBAR LAMINECTOMY/DECOMPRESSION MICRODISCECTOMY N/A 12/01/2015   Procedure: L5-S1 Decompression and Bilateral Microdiscectomy;  Surgeon: Marybelle Killings, MD;  Location: [place];  Service: Orthopedics;  Laterality: N/A;  . NECK SURGERY    . PERIPHERAL VASCULAR BALLOON ANGIOPLASTY  07/21/2017   Procedure: PERIPHERAL VASCULAR BALLOON ANGIOPLASTY;  Surgeon: Angelia Mould, MD;  Location: [facility];  Service: Cardiovascular;;  LT Arm AVF  . TONSILLECTOMY       Home Medications:  Prior to Admission medications   Medication Sig Start Date End Date Taking? Authorizing Provider  acetaminophen (TYLENOL) 325 MG tablet Take 325-650 mg by mouth 3 (three) times daily as needed for headache (pain).   Yes [provider]  allopurinol (ZYLOPRIM) 100 MG tablet Take 100 mg by mouth in the morning.   Yes [provider]  atorvastatin  (LIPITOR) 40 MG tablet Take 1 tablet (40 mg total) by mouth daily at 6 PM. Patient taking differently: Take 40 mg by mouth at bedtime. 04/28/18  Yes Angiulli, Lavon Paganini, PA-C  b complex vitamins capsule Take 1 capsule by mouth at bedtime.   Yes [provider]  brimonidine (ALPHAGAN P) 0.1 % SOLN Place 1-2 drops into both eyes See admin instructions. Instill one drop into right eye daily at bedtime, instill two drops into left eye daily at bedtime   Yes [provider]  Calcium Carbonate Antacid (TUMS PO) Take 1 tablet by mouth daily with lunch.   Yes [provider]  Calcium Polycarbophil (FIBER-LAX PO) Take 1 tablet by mouth 2 (two) times daily.   Yes [provider]  Cholecalciferol (VITAMIN D) 2000 UNITS tablet Take 2,000 Units by mouth 2 (two) times daily.   Yes [provider]  cinacalcet (SENSIPAR) 30 MG tablet Take 30 mg by mouth See admin instructions. Take one tablet (30 mg) by mouth on Tuesday and Thursday nights 02/14/20  Yes [provider]  citalopram (CELEXA) 20 MG tablet Take 20 mg by mouth in the morning. 02/08/20  Yes [provider]  clopidogrel (PLAVIX) 75 MG tablet Take 1 tablet (75 mg total) by mouth daily. Patient taking differently: Take 75 mg by mouth in the morning. 05/08/20  Yes Jettie Booze, MD  Cyanocobalamin (VITAMIN B-12) 1000 MCG SUBL Place 1,000 mcg under the tongue in the morning.   Yes [provider]  famotidine (PEPCID) 20 MG tablet Take 20 mg by mouth daily as needed for heartburn or indigestion.   Yes [provider]  hydrALAZINE (APRESOLINE) 25 MG tablet TAKE 1 TABLET BY MOUTH TWICE DAILY. ON DAYS OF DIALYSIS TAKE 1 TABLET BY MOUTH IN THE EVENING Patient taking differently: Take 25 mg by mouth See admin instructions. Take one tablet (25 mg) by mouth on Sunday, Monday, Wednesday, Friday mornings (non-dialysis days); take one tablet (25 mg) by mouth daily at bedtime 03/06/20  Yes  Jettie Booze, MD  insulin aspart (NOVOLOG FLEXPEN) 100 UNIT/ML FlexPen Inject 10-20 Units into the skin at bedtime as needed for high blood sugar (CBG >150).   Yes [provider]  lidocaine-prilocaine (EMLA) cream Apply 1 application topically daily as needed (one hour prior to dialysis). 08/10/20  Yes [provider]  multivitamin (RENA-VIT) TABS tablet Take  1 tablet by mouth at bedtime.   Yes [provider]  nitroGLYCERIN (NITROSTAT) 0.4 MG SL tablet Place 1 tablet (0.4 mg total) under the tongue every 5 (five) minutes as needed for chest pain. 05/08/20  Yes Jettie Booze, MD  Omega-3 Fatty Acids (FISH OIL) 1200 MG CAPS Take 1,200 mg by mouth 2 (two) times daily.   Yes [provider]  Probiotic Product (PROBIOTIC DAILY PO) Take 1 tablet by mouth every evening.   Yes [provider]  sevelamer carbonate (RENVELA) 800 MG tablet Take 1 tablet (800 mg total) by mouth 3 (three) times daily with meals. Patient taking differently: Take 1,600 mg by mouth 2 (two) times daily with a meal. 04/28/18  Yes Angiulli, Lavon Paganini, PA-C  timolol (TIMOPTIC) 0.5 % ophthalmic solution Place 1 drop into the left eye at bedtime.   Yes [provider]  Travoprost, BAK Free, (TRAVATAN) 0.004 % SOLN ophthalmic solution Place 1 drop into both eyes at bedtime. 09/09/20  Yes [provider]  Insulin Glargine (LANTUS) 100 UNIT/ML Solostar Pen Inject 15 Units into the skin daily. Patient not taking: No sig reported 04/28/18   Angiulli, Lavon Paganini, PA-C  Insulin Pen Needle (PEN NEEDLES) 31G X 8 MM MISC 15 Units by Does not apply route daily. 04/30/18   Meredith Staggers, MD    Inpatient Medications: Scheduled Meds: . acidophilus  1 capsule Oral QPM  . allopurinol  100 mg Oral q AM  . aspirin  81 mg Oral Pre-Cath  . atorvastatin  40 mg Oral QHS  . brimonidine  1 drop Right Eye QHS   And  . brimonidine  2 drop Left Eye QHS  . calcium carbonate  1  tablet Oral Q lunch  . Chlorhexidine Gluconate Cloth  6 each Topical Daily  . Chlorhexidine Gluconate Cloth  6 each Topical Q0600  . [START ON 09/26/2020] cinacalcet  30 mg Oral Once per day on Tue Thu  . citalopram  20 mg Oral q AM  . clopidogrel  75 mg Oral q AM  . heparin  5,000 Units Subcutaneous Q8H  . insulin aspart  0-5 Units Subcutaneous QHS  . insulin aspart  0-6 Units Subcutaneous TID WC  . latanoprost  1 drop Both Eyes QHS  . lidocaine  100 mg Intravenous STAT  . polycarbophil  625 mg Oral BID WC  . sevelamer carbonate  1,600 mg Oral BID WC  . sodium chloride flush  3 mL Intravenous Q12H  . sodium chloride flush  3 mL Intravenous Q12H  . sodium chloride flush  3 mL Intravenous Q12H  . timolol  1 drop Left Eye QHS  . vitamin B-12  1,000 mcg Oral q AM   Continuous Infusions: . sodium chloride    . sodium chloride 10 mL/hr at 09/25/20 0601  . sodium chloride    . sodium chloride    . amiodarone 30 mg/hr (09/25/20 0751)  . lidocaine 1 mg/min (09/25/20 0600)   PRN Meds: sodium chloride, sodium chloride, acetaminophen, famotidine, lidocaine-prilocaine, nitroGLYCERIN, sodium chloride flush, sodium chloride flush  Allergies:    Allergies  Allergen Reactions  . Neurontin [Gabapentin] Other (See Comments)    Hallucinations and "Makes me go crazy"   . Sulfa Antibiotics Other (See Comments)    Altered mental state   . Norvasc [Amlodipine] Other (See Comments)    "Makes my legs swell"; edema    Social History:   Social History   Socioeconomic History  . Marital  status: Married    Spouse name: Not on file  . Number of children: 2  . Years of education: Not on file  . Highest education level: Not on file  Occupational History  . Occupation: retired  Tobacco Use  . Smoking status: Never Smoker  . Smokeless tobacco: Never Used  Vaping Use  . Vaping Use: Never used  Substance and Sexual Activity  . Alcohol use: No    Alcohol/week: 0.0 standard drinks  . Drug use:  No  . Sexual activity: Not Currently    Birth control/protection: Post-menopausal  Other Topics Concern  . Not on file  Social History Narrative  . Not on file   Social Determinants of Health   Financial Resource Strain: Not on file  Food Insecurity: Not on file  Transportation Needs: Not on file  Physical Activity: Not on file  Stress: Not on file  Social Connections: Not on file  Intimate Partner Violence: Not on file    Family History:   Family History  Problem Relation Age of Onset  . Diabetes Mother   . Hypertension Mother   . Heart disease Mother        before age 25  . Heart attack Mother   . Stroke Mother   . Hyperlipidemia Father   . Hypertension Father   . Colon polyps Father   . Lung cancer Father   . Deep vein thrombosis Daughter   . Diabetes Daughter   . Hyperlipidemia Daughter   . Hypertension Daughter   . Heart disease Daughter   . Peripheral vascular disease Daughter   . Breast cancer Neg Hx      ROS:  Please see the history of present illness.  All other ROS reviewed and negative.     Physical Exam/Data:   Vitals:   09/25/20 0400 09/25/20 0500 09/25/20 0600 09/25/20 0738  BP:      Pulse: (!) 40 (!) 39 (!) 41   Resp: 12 18 17    Temp:    98.2 F (36.8 C)  TempSrc:    Axillary  SpO2: 91% 97% 98%   Weight:  79.9 kg    Height:        Intake/Output Summary (Last 24 hours) at 09/25/2020 0839 Last data filed at 09/25/2020 0600 Gross per 24 hour  Intake 890.1 ml  Output --  Net 890.1 ml   Last 3 Weights 09/25/2020 09/23/2020 07/03/2020  Weight (lbs) 176 lb 2.4 oz 170 lb 165 lb  Weight (kg) 79.9 kg 77.111 kg 74.844 kg     Body mass index is 30.24 kg/m.  General:  Well nourished, well developed, in no acute distress, chronically ill appearing HEENT: normal Lymph: no adenopathy Neck: no JVD Endocrine:  No thryomegaly Vascular: No carotid bruits Cardiac: RRR; bradycardic,1-2/6 SM Lungs:  CTA b/l, no wheezing, rhonchi or rales  Abd: soft,  nontender Ext: no edema Musculoskeletal:  No deformities, age appropriate atrophy Skin: warm and dry  Neuro:  No gross  focal abnormalities noted Psych:  Normal affect   EKG:  The EKG was personally reviewed and demonstrates:   SR 93bpm, RBBB, no ST changes  Telemetry:  Telemetry was personally reviewed and demonstrates:   SB 40's, some high 30's  Relevant CV Studies:   09/24/2020: TTE IMPRESSIONS  1. Patient had VT/VF arrest within minutes of starting echocardiogram.  Remainder of study completed post cath, post arrest.  2. Left ventricular ejection fraction, by estimation, is 45%. The left  ventricle has mildly  decreased function. The left ventricle demonstrates  regional wall motion abnormalities (see scoring diagram/findings for  description). Left ventricular diastolic  parameters are consistent with Grade II diastolic dysfunction  (pseudonormalization).  3. Right ventricular systolic function is severely reduced. The right  ventricular size is moderately enlarged. There is normal pulmonary artery  systolic pressure. The estimated right ventricular systolic pressure is  16.1 mmHg.  4. Left atrial size was moderately dilated.  5. Right atrial size was moderately dilated.  6. The mitral valve is degenerative. Mild mitral valve regurgitation. No  evidence of mitral stenosis.  7. The aortic valve is grossly normal. There is mild thickening of the  aortic valve. Aortic valve regurgitation is not visualized. No aortic  stenosis is present.  8. The inferior vena cava is normal in size with <50% respiratory  variability, suggesting right atrial pressure of 8 mmHg.    LV Wall Scoring:  The basal inferolateral segment and basal inferior segment are akinetic.     09/24/2020: LHC  -----------NATIVE VESSELS-------------------------  Colon Flattery LAD to Prox LAD lesion is 95% stenosed. Prox LAD lesion is 100% stenosed with 100% stenosed side branch in Ost 1st Diag.  Ost 1st  Mrg lesion is 75% stenosed.  Prox Cx to Mid Cx lesion is 100% stenosed (just after 1st Mrg) with side branch in Ost 2nd Mrg.  Prox RCA lesion is 95% stenosed. Mid RCA to Dist RCA lesion is 100% stenosed.  ------------GRAFTS--------------------------  SVG-RCA graft was visualized by angiography and is small. Origin to Mid Graft lesion is 100% stenosed. - Previously stented Graft  SVG-1stDiag graft was visualized by angiography. Dist Graft to Insertion lesion is 55% stenosed.  LIMA-dLAD graft was visualized by angiography and is normal in caliber. The graft exhibits no disease. There is no competitive flow  ---------------------------  There is mild left ventricular systolic dysfunction. The left ventricular ejection fraction is 45-50% by visual estimate.  LV end diastolic pressure is mildly elevated.  There is a significant difference in arterial pressures versus cuff pressures-arterial pressures are at least 30- 40 mmHg higher.   SUMMARY  Severe native vessel CAD:  ? 100% LAD following a 95% calcified lesion prior to SP1;  ? 100% mid RCA following severe 80% stenosis,  ? 100% AV groove LCx after OM1 with bridging collaterals to small PL branch,   75 to 80% calcified ostial OM1 (large bifurcating ramus-like vessel)  Grafts: ? New lesion: 100% ostial and proximal CTO of SVG-RCA (was a very small caliber vessel previously stented.  The nature of this occlusion is such that it does not appear to be acute-the patient not actively having chest pain and no ST elevations, minimal troponin. => Likely not a reasonable target for PCI ? SVG- 1st Diag has an anastomotic 50 to 60% stenosis of questionable significance (at this stage, the graft is the same caliber as a target vessel) ->   I chose not to attempt intervention on this location as she is hemodynamically clinically stable at this point. Would not want to complicate issues by potentially occluding another graft. ? Patent LIMA-LAD  with no left-to-right collaterals  Mildly reduced EF with inferobasal hypokinesis-45 to 50%.  Borderline elevated LVEDP of 15 mmHg  Significant discrepancy between aortic blood pressures and cuff blood pressures of at least 30 mmHg.  Cuff pressures are significantly lower.   RECOMMENDATIONS  Return to CCU, would continue amiodarone load, consider lidocaine if VT recurs.  EP consultation to consider the possibility of ICD.  For  future stabilization, could consider the possibility of intervention SVG-1st Diag (albeit a questionable significance), also atherectomy PCI of the OM1 (this is similar in appearance to previous cath in 2018, so this would only be to optimize distal flow - would likely need ischemic evaluation prior to proceeding with PCI on either lesion).  Prior to initiating pressor support in the ICU, would consider invasive arterial monitoring    Laboratory Data:  High Sensitivity Troponin:   Recent Labs  Lab 09/23/20 1937 09/23/20 2100 09/24/20 1150 09/24/20 1847  TROPONINIHS 20* 30* 475* 1,923*     Chemistry Recent Labs  Lab 09/23/20 1937 09/24/20 0315 09/24/20 1059 09/25/20 0424  NA 137  --  139 133*  K 3.3*  --  3.7 4.6  CL 93*  --  94* 90*  CO2 34*  --  34* 30  GLUCOSE 200*  --  157* 194*  BUN 7*  --  12 19  CREATININE 2.74* 3.09* 3.62* 4.46*  CALCIUM 7.7*  --  8.1* 8.5*  GFRNONAA 17* 15* 12* 9*  ANIONGAP 10  --  11 13    Recent Labs  Lab 09/23/20 1937  PROT 6.2*  ALBUMIN 3.0*  AST 43*  ALT 27  ALKPHOS 75  BILITOT 0.6   Hematology Recent Labs  Lab 09/23/20 1937 09/24/20 0315 09/25/20 0424  WBC 5.1 6.1 8.1  RBC 3.25* 3.23* 3.09*  HGB 11.0* 11.0* 10.3*  HCT 34.5* 34.2* 33.7*  MCV 106.2* 105.9* 109.1*  MCH 33.8 34.1* 33.3  MCHC 31.9 32.2 30.6  RDW 15.0 15.1 15.8*  PLT 129* 123* 129*   BNPNo results for input(s): BNP, PROBNP in the last 168 hours.  DDimer No results for input(s): DDIMER in the last 168  hours.   Radiology/Studies:  CT Head Wo Contrast Result Date: 09/23/2020 CLINICAL DATA:  Encephalopathy EXAM: CT HEAD WITHOUT CONTRAST TECHNIQUE: Contiguous axial images were obtained from the base of the skull through the vertex without intravenous contrast. COMPARISON:  None. FINDINGS: Brain: There is no mass, hemorrhage or extra-axial collection. The size and configuration of the ventricles and extra-axial CSF spaces are normal. There is hypoattenuation of the white matter, most commonly indicating chronic small vessel disease. Vascular: Atherosclerotic calcification of the internal carotid arteries at the skull base. No abnormal hyperdensity of the major intracranial arteries or dural venous sinuses. Skull: The visualized skull base, calvarium and extracranial soft tissues are normal. Sinuses/Orbits: Right maxillary sinus mucosal thickening. The orbits are normal. IMPRESSION: Chronic small vessel disease without acute intracranial abnormality. Electronically Signed   By: Ulyses Jarred M.D.   On: 09/23/2020 20:47    CT ANGIO CHEST PE W OR WO CONTRAST Result Date: 09/24/2020 CLINICAL DATA:  History of syncope and ventricular tachycardia EXAM: CT ANGIOGRAPHY CHEST WITH CONTRAST TECHNIQUE: Multidetector CT imaging of the chest was performed using the standard protocol during bolus administration of intravenous contrast. Multiplanar CT image reconstructions and MIPs were obtained to evaluate the vascular anatomy. CONTRAST:  46mL OMNIPAQUE IOHEXOL 350 MG/ML SOLN COMPARISON:  09/23/2020 FINDINGS: Cardiovascular: This is a technically adequate opacification of the pulmonary vasculature. No filling defects or pulmonary emboli. The heart is enlarged without pericardial effusion. Normal caliber of the thoracic aorta. Extensive atherosclerosis of the aorta and coronary vasculature. Postsurgical changes from previous CABG. Mediastinum/Nodes: No enlarged mediastinal, hilar, or axillary lymph nodes. Thyroid gland,  trachea, and esophagus demonstrate no significant findings. Lungs/Pleura: There are moderate bilateral pleural effusions volume estimated less than 1 L each. Dense bilateral dependent lower  lobe consolidation consistent with atelectasis. No pneumothorax. Central airways are patent. Upper Abdomen: No acute abnormality. Musculoskeletal: There are minimally displaced acute right anterolateral fourth through seventh rib fractures, likely related to recent CPR. Numerous other prior bilateral healed rib fractures are also noted. No other acute bony abnormalities. Reconstructed images demonstrate no additional findings. Review of the MIP images confirms the above findings. IMPRESSION: 1. No evidence of pulmonary embolus. 2. Moderate bilateral pleural effusions with dense bilateral lower lobe atelectasis. 3. Right anterolateral fourth through seventh acute rib fractures consistent with recent CPR. 4.  Aortic Atherosclerosis (ICD10-I70.0). Electronically Signed   By: Randa Ngo M.D.   On: 09/24/2020 21:17    MR BRAIN WO CONTRAST Result Date: 09/24/2020 CLINICAL DATA:  12-year-old female with recurrent syncope and confusion. EXAM: MRI HEAD WITHOUT CONTRAST TECHNIQUE: Multiplanar, multiecho pulse sequences of the brain and surrounding structures were obtained without intravenous contrast. COMPARISON:  Head CT 09/23/2020.  Brain MRI 07/13/2015. FINDINGS: Brain: No restricted diffusion to suggest acute infarction. No midline shift, mass effect, evidence of mass lesion, ventriculomegaly, extra-axial collection or acute intracranial hemorrhage. Cervicomedullary junction and pituitary are within normal limits. Mild generalized cerebral volume loss since 2016. But gray and white matter signal remains largely normal for age. No cortical encephalomalacia. But there are occasional chronic micro hemorrhages in the brain, including the posterior right frontal lobe (series 14, image 37) and left cerebellum (image 13). Furthermore,  there is intrinsic T1 hyperintensity in the bilateral globus pallidus since 2016 (series 16, image 30 on the left. Vascular: Major intracranial vascular flow voids are stable since 2016. 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 Mild associated visible cervical spinal stenosis, including at C2-C3. Heterogeneous bone marrow signal but within normal limits. Sinuses/Orbits: Negative orbits. Mild to moderate right maxillary sinus mucosal thickening is new, but other paranasal sinuses are improved compared to 2016. Other: Trace right mastoid fluid.  Negative visible nasopharynx. IMPRESSION: 1. Intrinsic T1 hyperintensity has developed in the bilateral globus pallidus since a 2016 MRI. This is most frequently associated with Hepatic Insufficiency / Cirrhosis. Correlation with serum Ammonia levels and liver function tests is recommended. 2. No other acute intracranial abnormality. Mild generalized cerebral volume loss since 2016 with occasional chronic micro hemorrhages in the brain. 3. 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 Electronically Signed   By: Genevie Ann M.D.   On: 09/24/2020 11:34   DG Chest Port 1 View Result Date: 09/23/2020 CLINICAL DATA:  Ventricular tachycardia, cardiac arrest status post resuscitation EXAM: PORTABLE CHEST 1 VIEW COMPARISON:  04/10/2018 FINDINGS: Single frontal view of the chest demonstrates stable enlargement the cardiac silhouette, with postsurgical changes from median sternotomy and bypass surgery. Retrocardiac consolidation may reflect atelectasis, infection, or aspiration. Small left pleural effusion. No pneumothorax. No acute displaced fractures. Vascular stent left axillary region. IMPRESSION: 1. Left lower lobe consolidation may reflect atelectasis, infection, or aspiration. 2. Small left pleural effusion. Electronically Signed   By: Randa Ngo M.D.   On: 09/23/2020 19:21     Assessment and Plan:   1. VF arrest     In setting of severe CAD, not intervened on     No CP, not felt to be ACS or acute occlusion of her SVG >RCA     Elevated HS Trop felt 2/2 arrest  on arrival she was mildly hypokalemic and mag as well. Both replaced to wnl this AM Advanced age and  ESRF/HD makes her a less then desirable candidate for ICD. S-ICD ? Though given her bradycardia, this will not support pacing needs  I do not think she is a good device candidate   lidocaine and amio gtts both stopped this AM via cardiology 2/2 bradycardia Given her ESRF lidocaine will hang around amio was running last night at 100/hr it seems  BP by arte line 299'B systolic  Will review case with Dr. Quentin Ore and he will see her later this morning   ADDEND: Dr. Quentin Ore has seen the patient  Not an ICD candidate given advanced age and comorbid conditions When we came by her PVC burden had already increased significantly including 3beat salvos, much like the behavior prior to her arrest yesterday and amiodarone gtt was resumed at 11 Dr. Quentin Ore discussed with the patient's daughter and grand daughter at bedside, medical management though concerned that we had little to offer if unable to revascularize her CAD and consideration for palliative care consult is recommended. Her daughter mentions that was told to them last ime this happened too and that was 3 years ago. She plans to talk with other family members. We will defer to attending team ( I have reached out via secure chat) HR 40's currently, PVC burden is doen again and BP by art line 130's/40's  CAD management with attending cardiologist    Risk Assessment/Risk Scores:  { For questions or updates, please contact [place] Please consult [email or web address] for contact info under    Signed, Baldwin Jamaica, PA-C  09/25/2020 8:39 AM

## 2020-09-25 NOTE — Progress Notes (Signed)
After discussion between daughter and Dr. Ellyn Hack, patient made a limited code blue with instructions per family wishes to only do Cardioversion/defibrillation and meds that are already going for vfib/vtach (amiodarone).  No CPR and do not place on bipap or ventilator.

## 2020-09-25 NOTE — Progress Notes (Signed)
Pts HR between 30-50's.Sinus Brady on the monitor.No change in mentation.Dr.Carlisle updated.

## 2020-09-25 NOTE — Progress Notes (Signed)
Spoke with Dr.Carlisle over the phone regarding pts MAP < 56mmhg.Per MD keep systolic pressure >63ZCHY.

## 2020-09-25 NOTE — Consult Note (Signed)
Consultation Note Date: 09/25/2020   Patient Name: [name]  DOB: 04/05/1941  MRN: [id number]  Age / Sex: 80 y.o., female  PCP: Josetta Huddle, MD Referring Physician: Donne Hazel, MD  Reason for Consultation: Establishing goals of care  HPI/Patient Profile: 80 y.o. female  with past medical history of ESRD on dialysis TTS, coronary artery disease s/p CABG, carotid stenosis, atrial fibrillation, previous history of non-sustained v-tach, DM2, HTN, HLD, and hearing loss presenting to the emergency department on 09/23/2020 with multiple episodes of syncope. She reportedly had an episode right after dialysis. Had another episode at home, became unresponsive, and family started CPR.  When EMS arrived, monitor showed nonsustained v-tach bigeminy, lidocaine infusion was started.  ED Course: On arrival to ED patient was back in sinus rhythm. Chest x-ray showed left lower lobe consolidation probably atelectasis versus infection or aspiration. She was admitted to [facility] for further evaluation of syncopal episodes.   On 3/13, patient was undergoing echocardiogram and developed symptomatic v-tach requiring CPR. She underwent emergent cardiac cath with findings of new 100% stenosis in multiple vessels. Patient was seen by EP, but is not a candidate for ICD.   Clinical Assessment and Goals of Care: I have reviewed medical records including EPIC notes, labs and imaging, and met at bedside with patient and family  to discuss diagnosis, prognosis, GOC, EOL wishes, disposition, and options.  Received report from primary RN - she reports several "episodes" today where patient will develop sudden severe pain and oxygen sats will drop into the 60's. Cardiac rhythm is ventricular bigeminy during these episodes.   Daughter [name] and patient's granddaughter are at bedside.  I introduced Palliative Medicine as specialized medical  care for people living with serious illness. It focuses on providing relief from the symptoms and stress of a serious illness.   We discussed a brief life review of the patient. She is originally from [facility]. 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 She also worked in a Clinical cyt[employer]ist. They have one daughter, [name]. Currently patient's husband is also a patient here at [facility]. [name] shares that he needed a lower extremity amputation and isn't doing well overall.   Prior to admission, patient lived with her husband at their home in [place]. She was ambulatory and independent with her ADLs. Family helped with shopping and housework.  As far as functional status, [name] reports a decline over the past 6 months stating that patient spent most of the day sleeping in a recliner.   We discussed her current illness and what it means in the larger context of her ongoing co-morbidities.  Natural disease trajectory of ESRD was discussed. I expressed concern that patient may not be stable to receive dialysis tomorrow due to her chest pain and cardiac arrhythmias. Daughter is hopeful that she can receive dialysis tomorrow.   Patient has been expressing to staff and family that she is dying. While I'm in the room, she states "it's coming soon". She  is discussing with her daughter and granddaughter how to divide her personal possessions (jewelry, [place], etc) after she has passed.   The difference between aggressive medical intervention and comfort care was briefly discussed. I introduced the concept of a comfort path to daughter and discussed that if patient is not stable to receive dialysis, it may be time to stop supportive medical interventions and focus on comfort versus prolonging life. Daughter does not seem ready to fully discuss comfort care at this time. We do discuss managing her chest pain and discomfort. Daughter is adamant about not utilizing a morphine drip, she feels that it  hastens the EOL process. I provided education that a continuous infusion would only be recommended if her pain could not be controlled with the intermittent doses; in this case I would recommend fentanyl and [name]  Indicates she would be ok with this.   Questions and concerns were addressed.  PMT contact card and provided and family was encouraged to call with questions or concerns.    SUMMARY OF RECOMMENDATIONS    DNR/DNI as previously documented  Continue current full scope medical treatment  Daughter is hopeful patient can receive dialysis tomorrow  If patient is not stable for dialysis tomorrow, family will need further discussion regarding comfort care  If chest pain is not controlled with current regimen, would recommend starting a fentanyl infusion (daughter indicates she is ok with this if necessary)  PMT will continue to follow  Code Status/Advance Care Planning:  DNR  Symptom Management:   Per primary team  Palliative Prophylaxis:   Frequent Pain Assessment and Turn Reposition  Additional Recommendations (Limitations, Scope, Preferences):  Full Scope Treatment  Psycho-social/Spiritual:   Created space and opportunity for family to express thoughts and feelings regarding patient's current medical situation.   Emotional support provided   Prognosis:  Poor  Discharge Planning: Anticipated Hospital Death      Primary Diagnoses: Present on Admission: . Anemia of chronic disease . Paroxysmal atrial fibrillation (HCC) . Coronary artery disease involving native heart without angina pectoris . Syncope and collapse . QT prolongation . Hypokalemia . Coronary artery disease involving autologous vein bypass graft . Essential hypertension . Hyperlipidemia associated with type 2 diabetes mellitus ([place])   I have reviewed the medical record, interviewed the patient and family, and examined the patient. The following aspects are pertinent.  Past Medical History:   Diagnosis Date  . Anemia   . Anxiety   . Arthritis   . Atrial fibrillation ([place])   . Blood transfusion   . C. difficile diarrhea   . CAD (coronary artery disease)   . Carotid artery occlusion    Carotid Endartectom,y - left 2009.  Blockage Right being watched by Dr Scot Dock.  . Carotid stenosis   . 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 Complication of anesthesia    pt. states that she was difficult to wake  . Depression   . Diabetes mellitus without complication ([place])   . Diverticulosis   . Dysrhythmia   . General weakness 12/2015  . GERD (gastroesophageal reflux disease)   . Glaucoma   . History of hiatal hernia   . History of kidney stones    passed  . History of pneumonia   . HOH (hard of hearing)   . Hypertension   . Hypokalemia 12/2015  . Melanoma ([place])    .  top of head- melonoma  . Myocardial infarction ([place])   . 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 Pneumonia   . SAH (subarachnoid hemorrhage) ([place]) 01/25/2017   bifrontal SAH following fall ([place])  . Shortness of breath   . 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 Stroke ([place])    hx of TIA    Family History  Problem Relation Age of Onset  . Diabetes Mother   . Hypertension Mother   . Heart disease Mother        before age 53  . Heart attack Mother   . Stroke Mother   . Hyperlipidemia Father   . Hypertension Father   . Colon polyps Father   . Lung cancer Father   . Deep vein thrombosis Daughter   . Diabetes Daughter   . Hyperlipidemia Daughter   . Hypertension Daughter   . Heart disease Daughter   . Peripheral vascular disease Daughter   . Breast cancer Neg Hx    Scheduled Meds: . allopurinol  100 mg Oral q AM  . atorvastatin  40 mg Oral QHS  . brimonidine  1 drop Right Eye QHS   And  . brimonidine  2 drop Left Eye QHS  . calcium carbonate  1 tablet Oral Q lunch  . Chlorhexidine Gluconate Cloth  6 each Topical Daily  . Chlorhexidine Gluconate Cloth  6 each  Topical Q0600  . [START ON 09/26/2020] cinacalcet  30 mg Oral Once per day on Tue Thu  . citalopram  20 mg Oral q AM  . clopidogrel  75 mg Oral q AM  . feeding supplement (NEPRO CARB STEADY)  237 mL Oral TID BM  . insulin aspart  0-5 Units Subcutaneous QHS  . insulin aspart  0-6 Units Subcutaneous TID WC  . latanoprost  1 drop Both Eyes QHS  . multivitamin  1 tablet Oral QHS  . polycarbophil  625 mg Oral BID WC  . sevelamer carbonate  1,600 mg Oral BID WC  . sodium chloride flush  3 mL Intravenous Q12H  . sodium chloride flush  3 mL Intravenous Q12H  . sodium chloride flush  3 mL Intravenous Q12H  . timolol  1 drop Left Eye QHS   Continuous Infusions: . sodium chloride    . sodium chloride Stopped (09/25/20 1025)  . sodium chloride    . sodium chloride    . amiodarone 30 mg/hr (09/25/20 1800)   PRN Meds:.sodium chloride, sodium chloride, acetaminophen, famotidine, lidocaine-prilocaine, morphine injection, nitroGLYCERIN, oxyCODONE-acetaminophen, sodium chloride flush, sodium chloride flush Medications Prior to Admission:  Prior to Admission medications   Medication Sig Start Date End Date Taking? Authorizing Provider  acetaminophen (TYLENOL) 325 MG tablet Take 325-650 mg by mouth 3 (three) times daily as needed for headache (pain).   Yes [provider]  allopurinol (ZYLOPRIM) 100 MG tablet Take 100 mg by mouth in the morning.   Yes [provider]  atorvastatin (LIPITOR) 40 MG tablet Take 1 tablet (40 mg total) by mouth daily at 6 PM. Patient taking differently: Take 40 mg by mouth at bedtime. 04/28/18  Yes Angiulli, Lavon Paganini, PA-C  b complex vitamins capsule Take 1 capsule by mouth at bedtime.   Yes [provider]  brimonidine (ALPHAGAN P) 0.1 % SOLN Place 1-2 drops into both eyes See admin instructions. Instill one drop into right eye daily at bedtime, instill two drops into left eye daily at bedtime   Yes [provider]  Calcium Carbonate  Antacid (TUMS PO) Take 1 tablet by mouth daily  with lunch.   Yes [provider]  Calcium Polycarbophil (FIBER-LAX PO) Take 1 tablet by mouth 2 (two) times daily.   Yes [provider]  Cholecalciferol (VITAMIN D) 2000 UNITS tablet Take 2,000 Units by mouth 2 (two) times daily.   Yes [provider]  cinacalcet (SENSIPAR) 30 MG tablet Take 30 mg by mouth See admin instructions. Take one tablet (30 mg) by mouth on Tuesday and Thursday nights 02/14/20  Yes [provider]  citalopram (CELEXA) 20 MG tablet Take 20 mg by mouth in the morning. 02/08/20  Yes [provider]  clopidogrel (PLAVIX) 75 MG tablet Take 1 tablet (75 mg total) by mouth daily. Patient taking differently: Take 75 mg by mouth in the morning. 05/08/20  Yes Jettie Booze, MD  Cyanocobalamin (VITAMIN B-12) 1000 MCG SUBL Place 1,000 mcg under the tongue in the morning.   Yes [provider]  famotidine (PEPCID) 20 MG tablet Take 20 mg by mouth daily as needed for heartburn or indigestion.   Yes [provider]  hydrALAZINE (APRESOLINE) 25 MG tablet TAKE 1 TABLET BY MOUTH TWICE DAILY. ON DAYS OF DIALYSIS TAKE 1 TABLET BY MOUTH IN THE EVENING Patient taking differently: Take 25 mg by mouth See admin instructions. Take one tablet (25 mg) by mouth on Sunday, Monday, Wednesday, Friday mornings (non-dialysis days); take one tablet (25 mg) by mouth daily at bedtime 03/06/20  Yes Jettie Booze, MD  insulin aspart (NOVOLOG FLEXPEN) 100 UNIT/ML FlexPen Inject 10-20 Units into the skin at bedtime as needed for high blood sugar (CBG >150).   Yes [provider]  lidocaine-prilocaine (EMLA) cream Apply 1 application topically daily as needed (one hour prior to dialysis). 08/10/20  Yes [provider]  multivitamin (RENA-VIT) TABS tablet Take 1 tablet by mouth at bedtime.   Yes [provider]  nitroGLYCERIN (NITROSTAT) 0.4 MG SL tablet Place 1 tablet (0.4  mg total) under the tongue every 5 (five) minutes as needed for chest pain. 05/08/20  Yes Jettie Booze, MD  Omega-3 Fatty Acids (FISH OIL) 1200 MG CAPS Take 1,200 mg by mouth 2 (two) times daily.   Yes [provider]  Probiotic Product (PROBIOTIC DAILY PO) Take 1 tablet by mouth every evening.   Yes [provider]  sevelamer carbonate (RENVELA) 800 MG tablet Take 1 tablet (800 mg total) by mouth 3 (three) times daily with meals. Patient taking differently: Take 1,600 mg by mouth 2 (two) times daily with a meal. 04/28/18  Yes Angiulli, Lavon Paganini, PA-C  timolol (TIMOPTIC) 0.5 % ophthalmic solution Place 1 drop into the left eye at bedtime.   Yes [provider]  Travoprost, BAK Free, (TRAVATAN) 0.004 % SOLN ophthalmic solution Place 1 drop into both eyes at bedtime. 09/09/20  Yes [provider]  Insulin Glargine (LANTUS) 100 UNIT/ML Solostar Pen Inject 15 Units into the skin daily. Patient not taking: No sig reported 04/28/18   Angiulli, Lavon Paganini, PA-C  Insulin Pen Needle (PEN NEEDLES) 31G X 8 MM MISC 15 Units by Does not apply route daily. 04/30/18   Meredith Staggers, MD   Allergies  Allergen Reactions  . Neurontin [Gabapentin] Other (See Comments)    Hallucinations and "Makes me go crazy"   . Sulfa Antibiotics Other (See Comments)    Altered mental state   . Norvasc [Amlodipine] Other (See Comments)    "Makes my legs swell"; edema   Review of Systems  Constitutional: Positive for fatigue.  Cardiovascular: Positive for chest pain.    Physical Exam Vitals reviewed.  Constitutional:      General: She is not in acute distress.    Appearance: She is ill-appearing.  HENT:     Right Ear: Decreased hearing noted.     Left Ear: Decreased hearing noted.  Cardiovascular:     Rate and Rhythm: Bradycardia present.     Comments: Frequent ectopy Pulmonary:     Effort: Pulmonary effort is normal.  Neurological:     Mental Status: She is alert and  oriented to person, place, and time.     Vital Signs: BP (!) 101/26   Pulse (!) 41   Temp 97.8 F (36.6 C) (Axillary)   Resp 15   Ht 5' 4"  (1.626 m)   Wt 79.9 kg   SpO2 96%   BMI 30.24 kg/m  Pain Scale: 0-10 POSS *See Group Information*: 1-Acceptable,Awake and alert Pain Score: Asleep   SpO2: SpO2: 96 % O2 Device:SpO2: 96 % O2 Flow Rate: .O2 Flow Rate (L/min): 5 L/min  IO: Intake/output summary:   Intake/Output Summary (Last 24 hours) at 09/25/2020 1922 Last data filed at 09/25/2020 1800 Gross per 24 hour  Intake 1679.02 ml  Output -  Net 1679.02 ml    LBM: Last BM Date: 09/23/20 Baseline Weight: Weight: 77.1 kg Most recent weight: Weight: 79.9 kg      Palliative Assessment/Data: PPS 30%    Time In: 19:00 Time Out: 20:12 Time Total: 72 minutes Greater than 50%  of this time was spent counseling and coordinating care related to the above assessment and plan.  Signed by: Lavena Bullion, NP   Please contact Palliative Medicine Team phone at [phone] for questions and concerns.  For individual provider: See Shea Evans

## 2020-09-25 NOTE — Progress Notes (Addendum)
PT Cancellation Note  Patient Details Name: [name] MRN: [id number] DOB: March 18, 1941   Cancelled Treatment:    Reason Eval/Treat Not Completed: Active bedrest order with fem art line   [place] Soy B Joslyne Marshburn 09/25/2020, 6:40 AM  Bayard Males, PT Acute Rehabilitation Services Pager: [phone] Office: [phone]

## 2020-09-25 NOTE — Progress Notes (Signed)
After discussion between both Dr. Ellyn Hack and Dr. Wyline Copas,  patient and her family, patient made a DNR per patient request.  Family and minister at the bedside with patient.

## 2020-09-25 NOTE — Consult Note (Signed)
Referring Provider: No ref. provider found Primary Care Physician:  Josetta Huddle, MD Primary Nephrologist:  Dr.   Luiz Iron for Consultation: Management to euvolemia, medical management of end-stage renal disease, assessment treatment of anemia, assessment treatment secondary hyperparathyroidism.  HPI: This is an 80 year old lady with a history of end-stage renal disease Tuesday Thursday Saturday dialysis.  She also has carotid stenosis atrial fibrillation previous history of sustained V. tach diabetes mellitus essential hypertension hyperlipidemia.  She is unfortunately had multiple syncopal episodes secondary to recurrent ventricular tachyarrhythmia.  She was scheduled to undergo left heart catheterization 09/25/2020 and sustained an episode of symptomatic V. tach prompting CODE BLUE.  She required brief chest compressions with ROSC.  She underwent catheterization 09/24/2020 with new 100% ostial and proximal CTO of SVG-RCA.  Recommendations are for EP and possible ICD.  She dialyzes at [facility].  CT angio no evidence of pulmonary embolus.  MRI brain intrinsic T1 hyper intensity in bilateral globus pallidus since 2016 no acute changes  Blood pressure 120/29 pulse 37 temperature 98.2 O2 sats 95% 5 L nasal cannula  Sodium 133 potassium 4.6 chloride 90 CO2 30 BUN 19 creatinine 4.46 glucose 194 calcium 8.5 hemoglobin 10.3  Allopurinol 100 mg daily Lipitor 40 mg daily Tums 200 mg with meals Sensipar 30 mg daily Celexa 20 mg daily, Pepcid 20 mg daily insulin sliding scale, multivitamins 1 daily, Renvela 1.6 g with meals, Lantus 15 units daily    Past Medical History:  Diagnosis Date  . Anemia   . Anxiety   . Arthritis   . Atrial fibrillation ([place])   . Blood transfusion   . C. difficile diarrhea   . CAD (coronary artery disease)   . Carotid artery occlusion    Carotid Endartectom,y - left 2009.  Blockage Right being watched by Dr Scot Dock.  . Carotid stenosis   . Chronic  kidney disease    patient states stage IV  . Complication of anesthesia    pt. states that she was difficult to wake  . Depression   . Diabetes mellitus without complication ([place])   . Diverticulosis   . Dysrhythmia   . General weakness 12/2015  . GERD (gastroesophageal reflux disease)   . Glaucoma   . History of hiatal hernia   . History of kidney stones    passed  . History of pneumonia   . HOH (hard of hearing)   . Hypertension   . Hypokalemia 12/2015  . Melanoma ([place])    .  top of head- melonoma  . Myocardial infarction ([place])   . Neuromuscular disorder ([place])    CARPEL TUNNEL  . Pneumonia   . SAH (subarachnoid hemorrhage) ([place]) 01/25/2017   bifrontal SAH following fall ([place])  . Shortness of breath   . 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 Stroke ([place])    hx of TIA    Past Surgical History:  Procedure Laterality Date  . A/V FISTULAGRAM N/A 07/21/2017   Procedure: A/V FISTULAGRAM - Left Arm;  Surgeon: Angelia Mould, MD;  Location: [place] [facility];  Service: Cardiovascular;  Laterality: N/A;  . A/V FISTULAGRAM Left 03/17/2020   Procedure: A/V Fistulagram;  Surgeon: Angelia Mould, MD;  Location: [place] [facility];  Service: Cardiovascular;  Laterality: Left;  arm  . AV FISTULA PLACEMENT Left 05/31/2014   Procedure: Creation of Left Arm arteriovenous brachiocephalic Fistula;  Surgeon: Angelia Mould, MD;  Location: [place];  Service: Vascular;  Laterality: Left;  . AV FISTULA PLACEMENT Left 09/01/2014   Procedure: INSERTION OF ARTERIOVENOUS (AV) GORE-TEX GRAFT LEFT UPPER ARM USING  4-7 MM X 45 CM SRTETCH GORETEX GRAFT;  Surgeon: Angelia Mould, MD;  Location: [place];  Service: Vascular;  Laterality: Left;  . BACK SURGERY    . BASCILIC VEIN TRANSPOSITION Right 07/03/2020   Procedure: BASCILIC VEIN TRANSPOSITION;  Surgeon: Angelia Mould, MD;  Location: [place];  Service: Vascular;  Laterality: Right;  . CARDIAC CATHETERIZATION     . CAROTID ENDARTERECTOMY Left 2009    CEA  . carpel tunnel    . COLONOSCOPY  07/25/2011   Procedure: COLONOSCOPY;  Surgeon: Winfield Cunas., MD;  Location: [facility] ENDOSCOPY;  Service: Endoscopy;  Laterality: N/A;  . CORONARY ARTERY BYPASS GRAFT  07/29/2011   Procedure: CORONARY ARTERY BYPASS GRAFTING (CABG);  Surgeon: Gaye Pollack, MD;  Location: [place];  Service: Open Heart Surgery;  Laterality: N/A;  . CORONARY STENT INTERVENTION N/A 04/08/2018   Procedure: CORONARY STENT INTERVENTION;  Surgeon: Jettie Booze, MD;  Location: [place] [facility] LAB;  Service: Cardiovascular;  Laterality: N/A;  . ENDARTERECTOMY Right 04/21/2015   Procedure: ENDARTERECTOMY CAROTID;  Surgeon: Angelia Mould, MD;  Location: [place];  Service: Vascular;  Laterality: Right;  . ESOPHAGOGASTRODUODENOSCOPY  07/25/2011   Procedure: ESOPHAGOGASTRODUODENOSCOPY (EGD);  Surgeon: Winfield Cunas., MD;  Location: [facility] ENDOSCOPY;  Service: Endoscopy;  Laterality: N/A;  . EYE SURGERY    . FISTULOGRAM Left 08/29/2014   Procedure: FISTULOGRAM;  Surgeon: Angelia Mould, MD;  Location: [facility] C[facility] LAB;  Service: Cardiovascular;  Laterality: Left;  . KNEE SURGERY Left   . LEFT HEART CATH AND CORS/GRAFTS ANGIOGRAPHY N/A 04/08/2018   Procedure: LEFT HEART CATH AND CORS/GRAFTS ANGIOGRAPHY;  Surgeon: Jettie Booze, MD;  Location: [place] [facility] LAB;  Service: Cardiovascular;  Laterality: N/A;  . LEFT HEART CATH AND CORS/GRAFTS ANGIOGRAPHY N/A 09/24/2020   Procedure: LEFT HEART CATH AND CORS/GRAFTS ANGIOGRAPHY;  Surgeon: Leonie Man, MD;  Location: [place] [facility] LAB;  Service: Cardiovascular;  Laterality: N/A;  . LUMBAR LAMINECTOMY/DECOMPRESSION MICRODISCECTOMY N/A 12/01/2015   Procedure: L5-S1 Decompression and Bilateral Microdiscectomy;  Surgeon: Marybelle Killings, MD;  Location: [place];  Service: Orthopedics;  Laterality: N/A;  . NECK SURGERY    . PERIPHERAL VASCULAR BALLOON ANGIOPLASTY  07/21/2017   Procedure:  PERIPHERAL VASCULAR BALLOON ANGIOPLASTY;  Surgeon: Angelia Mould, MD;  Location: [place] CV LAB;  Service: Cardiovascular;;  LT Arm AVF  . TONSILLECTOMY      Prior to Admission medications   Medication Sig Start Date End Date Taking? Authorizing Provider  acetaminophen (TYLENOL) 325 MG tablet Take 325-650 mg by mouth 3 (three) times daily as needed for headache (pain).   Yes [provider]  allopurinol (ZYLOPRIM) 100 MG tablet Take 100 mg by mouth in the morning.   Yes [provider]  atorvastatin (LIPITOR) 40 MG tablet Take 1 tablet (40 mg total) by mouth daily at 6 PM. Patient taking differently: Take 40 mg by mouth at bedtime. 04/28/18  Yes Angiulli, Lavon Paganini, PA-C  b complex vitamins capsule Take 1 capsule by mouth at bedtime.   Yes [provider]  brimonidine (ALPHAGAN P) 0.1 % SOLN Place 1-2 drops into both eyes See admin instructions. Instill one drop into right eye daily at bedtime, instill two drops into left eye daily at bedtime   Yes [provider]  Calcium Carbonate Antacid (TUMS  PO) Take 1 tablet by mouth daily with lunch.   Yes [provider]  Calcium Polycarbophil (FIBER-LAX PO) Take 1 tablet by mouth 2 (two) times daily.   Yes [provider]  Cholecalciferol (VITAMIN D) 2000 UNITS tablet Take 2,000 Units by mouth 2 (two) times daily.   Yes [provider]  cinacalcet (SENSIPAR) 30 MG tablet Take 30 mg by mouth See admin instructions. Take one tablet (30 mg) by mouth on Tuesday and Thursday nights 02/14/20  Yes [provider]  citalopram (CELEXA) 20 MG tablet Take 20 mg by mouth in the morning. 02/08/20  Yes [provider]  clopidogrel (PLAVIX) 75 MG tablet Take 1 tablet (75 mg total) by mouth daily. Patient taking differently: Take 75 mg by mouth in the morning. 05/08/20  Yes Jettie Booze, MD  Cyanocobalamin (VITAMIN B-12) 1000 MCG SUBL Place 1,000 mcg under the tongue in the  morning.   Yes [provider]  famotidine (PEPCID) 20 MG tablet Take 20 mg by mouth daily as needed for heartburn or indigestion.   Yes [provider]  hydrALAZINE (APRESOLINE) 25 MG tablet TAKE 1 TABLET BY MOUTH TWICE DAILY. ON DAYS OF DIALYSIS TAKE 1 TABLET BY MOUTH IN THE EVENING Patient taking differently: Take 25 mg by mouth See admin instructions. Take one tablet (25 mg) by mouth on Sunday, Monday, Wednesday, Friday mornings (non-dialysis days); take one tablet (25 mg) by mouth daily at bedtime 03/06/20  Yes Jettie Booze, MD  insulin aspart (NOVOLOG FLEXPEN) 100 UNIT/ML FlexPen Inject 10-20 Units into the skin at bedtime as needed for high blood sugar (CBG >150).   Yes [provider]  lidocaine-prilocaine (EMLA) cream Apply 1 application topically daily as needed (one hour prior to dialysis). 08/10/20  Yes [provider]  multivitamin (RENA-VIT) TABS tablet Take 1 tablet by mouth at bedtime.   Yes [provider]  nitroGLYCERIN (NITROSTAT) 0.4 MG SL tablet Place 1 tablet (0.4 mg total) under the tongue every 5 (five) minutes as needed for chest pain. 05/08/20  Yes Jettie Booze, MD  Omega-3 Fatty Acids (FISH OIL) 1200 MG CAPS Take 1,200 mg by mouth 2 (two) times daily.   Yes [provider]  Probiotic Product (PROBIOTIC DAILY PO) Take 1 tablet by mouth every evening.   Yes [provider]  sevelamer carbonate (RENVELA) 800 MG tablet Take 1 tablet (800 mg total) by mouth 3 (three) times daily with meals. Patient taking differently: Take 1,600 mg by mouth 2 (two) times daily with a meal. 04/28/18  Yes Angiulli, Lavon Paganini, PA-C  timolol (TIMOPTIC) 0.5 % ophthalmic solution Place 1 drop into the left eye at bedtime.   Yes [provider]  Travoprost, BAK Free, (TRAVATAN) 0.004 % SOLN ophthalmic solution Place 1 drop into both eyes at bedtime. 09/09/20  Yes [provider]  Insulin Glargine (LANTUS) 100  UNIT/ML Solostar Pen Inject 15 Units into the skin daily. Patient not taking: No sig reported 04/28/18   Angiulli, Lavon Paganini, PA-C  Insulin Pen Needle (PEN NEEDLES) 31G X 8 MM MISC 15 Units by Does not apply route daily. 04/30/18   Meredith Staggers, MD    Current Facility-Administered Medications  Medication Dose Route Frequency Provider Last Rate Last Admin  . 0.9 %  sodium chloride infusion  250 mL Intravenous PRN Duke, Tami Lin, PA      . 0.9 %  sodium chloride infusion   Intravenous Continuous Duke, Tami Lin, PA 10  mL/hr at 09/25/20 0601 New Bag at 09/25/20 0601  . 0.9 %  sodium chloride infusion  250 mL Intravenous Continuous Leonie Man, MD      . 0.9 %  sodium chloride infusion  250 mL Intravenous PRN Leonie Man, MD      . acetaminophen (TYLENOL) tablet 325-650 mg  325-650 mg Oral TID PRN Leonie Man, MD   650 mg at 09/25/20 0413  . acidophilus (RISAQUAD) capsule 1 capsule  1 capsule Oral QPM Leonie Man, MD   1 capsule at 09/24/20 1632  . allopurinol (ZYLOPRIM) tablet 100 mg  100 mg Oral q AM Donne Hazel, MD      . amiodarone (NEXTERONE PREMIX) 360-4.14 MG/200ML-% (1.8 mg/mL) IV infusion  30 mg/hr Intravenous Continuous Leonie Man, MD 16.67 mL/hr at 09/25/20 0751 30 mg/hr at 09/25/20 0751  . aspirin chewable tablet 81 mg  81 mg Oral Pre-Cath Duke, Tami Lin, [place]      . atorvastatin (LIPITOR) tablet 40 mg  40 mg Oral QHS Leonie Man, MD   40 mg at 09/24/20 2142  . brimonidine (ALPHAGAN) 0.2 % ophthalmic solution 1 drop  1 drop Right Eye QHS Leonie Man, MD   1 drop at 09/24/20 2123   And  . brimonidine (ALPHAGAN) 0.2 % ophthalmic solution 2 drop  2 drop Left Eye QHS Leonie Man, MD   2 drop at 09/24/20 2123  . calcium carbonate (TUMS - dosed in mg elemental calcium) chewable tablet 200 mg of elemental calcium  1 tablet Oral Q lunch Leonie Man, MD      . Chlorhexidine Gluconate Cloth 2 % PADS 6 each  6 each Topical Daily  Donne Hazel, MD   6 each at 09/24/20 1642  . [START ON 09/26/2020] cinacalcet (SENSIPAR) tablet 30 mg  30 mg Oral Once per day on Tue Thu Harding, David W, MD      . citalopram (CELEXA) tablet 20 mg  20 mg Oral q AM Donne Hazel, MD      . clopidogrel (PLAVIX) tablet 75 mg  75 mg Oral q AM Donne Hazel, MD      . famotidine (PEPCID) tablet 20 mg  20 mg Oral Daily PRN Leonie Man, MD      . heparin injection 5,000 Units  5,000 Units Subcutaneous Q8H Leonie Man, MD   5,000 Units at 09/25/20 0554  . insulin aspart (novoLOG) injection 0-5 Units  0-5 Units Subcutaneous QHS Leonie Man, MD   2 Units at 09/24/20 2141  . insulin aspart (novoLOG) injection 0-6 Units  0-6 Units Subcutaneous TID WC Leonie Man, MD   1 Units at 09/25/20 [phone]  . latanoprost (XALATAN) 0.005 % ophthalmic solution 1 drop  1 drop Both Eyes QHS Leonie Man, MD   1 drop at 09/24/20 2142  . lidocaine (XYLOCAINE) 4 mg/mL bolus via infusion 100 mg  100 mg Intravenous STAT Leonie Man, MD       And  . lidocaine (cardiac) 2000 mg in dextrose 5% 500 mL (4mg /mL) IV infusion  1 mg/min Intravenous Continuous Leonie Man, MD 15 mL/hr at 09/25/20 0600 1 mg/min at 09/25/20 0600  . lidocaine-prilocaine (EMLA) cream 1 application  1 application Topical Daily PRN Leonie Man, MD      . nitroGLYCERIN (NITROSTAT) SL tablet 0.4 mg  0.4 mg Sublingual Q5 min PRN Leonie Man, MD      .  polycarbophil (FIBERCON) tablet 625 mg  625 mg Oral BID WC Leonie Man, MD   625 mg at 09/25/20 0804  . sevelamer carbonate (RENVELA) tablet 1,600 mg  1,600 mg Oral BID WC Leonie Man, MD   1,600 mg at 09/25/20 0804  . sodium chloride flush (NS) 0.9 % injection 3 mL  3 mL Intravenous Q12H Leonie Man, MD   3 mL at 09/24/20 0836  . sodium chloride flush (NS) 0.9 % injection 3 mL  3 mL Intravenous Q12H Ledora Bottcher, PA   3 mL at 09/24/20 2226  . sodium chloride flush (NS) 0.9 % injection 3 mL  3 mL  Intravenous PRN Ledora Bottcher, PA      . sodium chloride flush (NS) 0.9 % injection 3 mL  3 mL Intravenous Q12H Leonie Man, MD   3 mL at 09/24/20 2226  . sodium chloride flush (NS) 0.9 % injection 3 mL  3 mL Intravenous PRN Leonie Man, MD      . timolol (TIMOPTIC) 0.5 % ophthalmic solution 1 drop  1 drop Left Eye QHS Leonie Man, MD   1 drop at 09/24/20 2127  . vitamin B-12 (CYANOCOBALAMIN) tablet 1,000 mcg  1,000 mcg Oral q AM Donne Hazel, MD        Allergies as of 09/23/2020 - Review Complete 09/23/2020  Allergen Reaction Noted  . Neurontin [gabapentin] Other (See Comments) 03/30/2015  . Sulfa antibiotics Other (See Comments) 11/30/2015  . Norvasc [amlodipine] Other (See Comments) 11/30/2015    Family History  Problem Relation Age of Onset  . Diabetes Mother   . Hypertension Mother   . Heart disease Mother        before age 66  . Heart attack Mother   . Stroke Mother   . Hyperlipidemia Father   . Hypertension Father   . Colon polyps Father   . Lung cancer Father   . Deep vein thrombosis Daughter   . Diabetes Daughter   . Hyperlipidemia Daughter   . Hypertension Daughter   . Heart disease Daughter   . Peripheral vascular disease Daughter   . Breast cancer Neg Hx     Social History   Socioeconomic History  . Marital status: Married    Spouse name: Not on file  . Number of children: 2  . Years of education: Not on file  . Highest education level: Not on file  Occupational History  . Occupation: retired  Tobacco Use  . Smoking status: Never Smoker  . Smokeless tobacco: Never Used  Vaping Use  . Vaping Use: Never used  Substance and Sexual Activity  . Alcohol use: No    Alcohol/week: 0.0 standard drinks  . Drug use: No  . Sexual activity: Not Currently    Birth control/protection: Post-menopausal  Other Topics Concern  . Not on file  Social History Narrative  . Not on file   Social Determinants of Health   Financial Resource  Strain: Not on file  Food Insecurity: Not on file  Transportation Needs: Not on file  Physical Activity: Not on file  Stress: Not on file  Social Connections: Not on file  Intimate Partner Violence: Not on file    Review of Systems: Gen: Denies any fever, chills, sweats, anorexia, fatigue, weakness, malaise, weight loss, and sleep disorder HEENT: No visual complaints, No history of Retinopathy. Normal external appearance No Epistaxis or Sore throat. No sinusitis.   CV: Denies chest pain, angina,  palpitations, syncope, orthopnea, PND, peripheral edema, and claudication. Resp: Denies dyspnea at rest, dyspnea with exercise, cough, sputum, wheezing, coughing up blood, and pleurisy. GI: Denies vomiting blood, jaundice, and fecal incontinence.   Denies dysphagia or odynophagia. GU : Denies urinary burning, blood in urine, urinary frequency, urinary hesitancy, nocturnal urination, and urinary incontinence.  No renal calculi. MS: Denies joint pain, limitation of movement, and swelling, stiffness, low back pain, extremity pain. Denies muscle weakness, cramps, atrophy.  No use of non steroidal antiinflammatory drugs. Derm: Denies rash, itching, dry skin, hives, moles, warts, or unhealing ulcers.  Psych: Denies depression, anxiety, memory loss, suicidal ideation, hallucinations, paranoia, and confusion. Heme: Denies bruising, bleeding, and enlarged lymph nodes. Neuro: No headache.  No diplopia. No dysarthria.  No dysphasia.  No history of CVA.  No Seizures. No paresthesias.  No weakness. Endocrine No DM.  No Thyroid disease.  No Adrenal disease.  Physical Exam: Vital signs in last 24 hours: Temp:  [98.2 F (36.8 C)-98.4 F (36.9 C)] 98.2 F (36.8 C) (03/14 0738) Pulse Rate:  [0-102] 41 (03/14 0600) Resp:  [0-47] 17 (03/14 0600) BP: (49-144)/(22-111) 84/25 (03/13 2000) SpO2:  [0 %-100 %] 98 % (03/14 0600) Arterial Line BP: (90-120)/(27-37) 108/29 (03/14 0600) Weight:  [79.9 kg] 79.9 kg (03/14  0500) Last BM Date: 09/23/20 General:   Alert,  Well-developed, well-nourished, pleasant and cooperative in NAD Head:  Normocephalic and atraumatic. Eyes:  Sclera clear, no icterus.   Conjunctiva pink. Ears:  Normal auditory acuity. Nose:  No deformity, discharge,  or lesions. Mouth:  No deformity or lesions, dentition normal. Neck:  Supple; no masses or thyromegaly. JVP not elevated Lungs:  Clear throughout to auscultation.   No wheezes, crackles, or rhonchi. No acute distress. Heart:  Regular rate and rhythm; no murmurs, clicks, rubs,  or gallops. Abdomen:  Soft, nontender and nondistended. No masses, hepatosplenomegaly or hernias noted. Normal bowel sounds, without guarding, and without rebound.   Msk:  Symmetrical without gross deformities. Normal posture. Pulses:  No carotid, renal, femoral bruits. DP and PT symmetrical and equal Extremities:  Without clubbing or edema. Neurologic:  Alert and  oriented x4;  grossly normal neurologically. Skin:  Intact without significant lesions or rashes. Cervical Nodes:  No significant cervical adenopathy. Psych:  Alert and cooperative. Normal mood and affect.  Intake/Output from previous day: 03/13 0701 - 03/14 0700 In: 890.1 [P.O.:50; I.V.:790; IV Piggyback:50.1] Out: -  Intake/Output this shift: No intake/output data recorded.  Lab Results: Recent Labs    09/23/20 1937 09/24/20 0315 09/25/20 0424  WBC 5.1 6.1 8.1  HGB 11.0* 11.0* 10.3*  HCT 34.5* 34.2* 33.7*  PLT 129* 123* 129*   BMET Recent Labs    09/23/20 1937 09/24/20 0315 09/24/20 1059 09/25/20 0424  NA 137  --  139 133*  K 3.3*  --  3.7 4.6  CL 93*  --  94* 90*  CO2 34*  --  34* 30  GLUCOSE 200*  --  157* 194*  BUN 7*  --  12 19  CREATININE 2.74* 3.09* 3.62* 4.46*  CALCIUM 7.7*  --  8.1* 8.5*  PHOS 2.2*  --   --   --    LFT Recent Labs    09/23/20 1937  PROT 6.2*  ALBUMIN 3.0*  AST 43*  ALT 27  ALKPHOS 75  BILITOT 0.6   PT/INR Recent Labs     09/23/20 1937  LABPROT 14.0  INR 1.1   Hepatitis Panel No results for input(s):  HEPBSAG, HCVAB, HEPAIGM, HEPBIGM in the last 72 hours.  Studies/Results: CT Head Wo Contrast  Result Date: 09/23/2020 CLINICAL DATA:  Encephalopathy EXAM: CT HEAD WITHOUT CONTRAST TECHNIQUE: Contiguous axial images were obtained from the base of the skull through the vertex without intravenous contrast. COMPARISON:  None. FINDINGS: Brain: There is no mass, hemorrhage or extra-axial collection. The size and configuration of the ventricles and extra-axial CSF spaces are normal. There is hypoattenuation of the white matter, most commonly indicating chronic small vessel disease. Vascular: Atherosclerotic calcification of the internal carotid arteries at the skull base. No abnormal hyperdensity of the major intracranial arteries or dural venous sinuses. Skull: The visualized skull base, calvarium and extracranial soft tissues are normal. Sinuses/Orbits: Right maxillary sinus mucosal thickening. The orbits are normal. IMPRESSION: Chronic small vessel disease without acute intracranial abnormality. Electronically Signed   By: Ulyses Jarred M.D.   On: 09/23/2020 20:47   CT ANGIO CHEST PE W OR WO CONTRAST  Result Date: 09/24/2020 CLINICAL DATA:  History of syncope and ventricular tachycardia EXAM: CT ANGIOGRAPHY CHEST WITH CONTRAST TECHNIQUE: Multidetector CT imaging of the chest was performed using the standard protocol during bolus administration of intravenous contrast. Multiplanar CT image reconstructions and MIPs were obtained to evaluate the vascular anatomy. CONTRAST:  30mL OMNIPAQUE IOHEXOL 350 MG/ML SOLN COMPARISON:  09/23/2020 FINDINGS: Cardiovascular: This is a technically adequate opacification of the pulmonary vasculature. No filling defects or pulmonary emboli. The heart is enlarged without pericardial effusion. Normal caliber of the thoracic aorta. Extensive atherosclerosis of the aorta and coronary vasculature.  Postsurgical changes from previous CABG. Mediastinum/Nodes: No enlarged mediastinal, hilar, or axillary lymph nodes. Thyroid gland, trachea, and esophagus demonstrate no significant findings. Lungs/Pleura: There are moderate bilateral pleural effusions volume estimated less than 1 L each. Dense bilateral dependent lower lobe consolidation consistent with atelectasis. No pneumothorax. Central airways are patent. Upper Abdomen: No acute abnormality. Musculoskeletal: There are minimally displaced acute right anterolateral fourth through seventh rib fractures, likely related to recent CPR. Numerous other prior bilateral healed rib fractures are also noted. No other acute bony abnormalities. Reconstructed images demonstrate no additional findings. Review of the MIP images confirms the above findings. IMPRESSION: 1. No evidence of pulmonary embolus. 2. Moderate bilateral pleural effusions with dense bilateral lower lobe atelectasis. 3. Right anterolateral fourth through seventh acute rib fractures consistent with recent CPR. 4.  Aortic Atherosclerosis (ICD10-I70.0). Electronically Signed   By: Randa Ngo M.D.   On: 09/24/2020 21:17   MR BRAIN WO CONTRAST  Result Date: 09/24/2020 CLINICAL DATA:  27-year-old female with recurrent syncope and confusion. EXAM: MRI HEAD WITHOUT CONTRAST TECHNIQUE: Multiplanar, multiecho pulse sequences of the brain and surrounding structures were obtained without intravenous contrast. COMPARISON:  Head CT 09/23/2020.  Brain MRI 07/13/2015. FINDINGS: Brain: No restricted diffusion to suggest acute infarction. No midline shift, mass effect, evidence of mass lesion, ventriculomegaly, extra-axial collection or acute intracranial hemorrhage. Cervicomedullary junction and pituitary are within normal limits. Mild generalized cerebral volume loss since 2016. But gray and white matter signal remains largely normal for age. No cortical encephalomalacia. But there are occasional chronic micro  hemorrhages in the brain, including the posterior right frontal lobe (series 14, image 37) and left cerebellum (image 13). Furthermore, there is intrinsic T1 hyperintensity in the bilateral globus pallidus since 2016 (series 16, image 30 on the left. Vascular: Major intracranial vascular flow voids are stable since 2016. 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 Mild  associated visible cervical spinal stenosis, including at C2-C3. Heterogeneous bone marrow signal but within normal limits. Sinuses/Orbits: Negative orbits. Mild to moderate right maxillary sinus mucosal thickening is new, but other paranasal sinuses are improved compared to 2016. Other: Trace right mastoid fluid.  Negative visible nasopharynx. IMPRESSION: 1. Intrinsic T1 hyperintensity has developed in the bilateral globus pallidus since a 2016 MRI. This is most frequently associated with Hepatic Insufficiency / Cirrhosis. Correlation with serum Ammonia levels and liver function tests is recommended. 2. No other acute intracranial abnormality. Mild generalized cerebral volume loss since 2016 with occasional chronic micro hemorrhages in the brain. 3. 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 Electronically Signed   By: Genevie Ann M.D.   On: 09/24/2020 11:34   CARDIAC CATHETERIZATION  Result Date: 09/24/2020  -----------NATIVE VESSELS-------------------------  Ost LAD to Prox LAD lesion is 95% stenosed. Prox LAD lesion is 100% stenosed with 100% stenosed side branch in Ost 1st Diag.  Ost 1st Mrg lesion is 75% stenosed.  Prox Cx to Mid Cx lesion is 100% stenosed (just after 1st Mrg) with side branch in Ost 2nd Mrg.  Prox RCA lesion is 95% stenosed. Mid RCA to Dist RCA lesion is 100% stenosed.  ------------GRAFTS--------------------------  SVG-RCA graft was visualized by angiography and is small. Origin to  Mid Graft lesion is 100% stenosed. - Previously stented Graft  SVG-1stDiag graft was visualized by angiography. Dist Graft to Insertion lesion is 55% stenosed.  LIMA-dLAD graft was visualized by angiography and is normal in caliber. The graft exhibits no disease. There is no competitive flow  ---------------------------  There is mild left ventricular systolic dysfunction. The left ventricular ejection fraction is 45-50% by visual estimate.  LV end diastolic pressure is mildly elevated.  There is a significant difference in arterial pressures versus cuff pressures-arterial pressures are at least 30- 40 mmHg higher.  SUMMARY  Severe native vessel CAD:  100% LAD following a 95% calcified lesion prior to SP1;  100% mid RCA following severe 80% stenosis,  100% AV groove LCx after OM1 with bridging collaterals to small PL branch,  75 to 80% calcified ostial OM1 (large bifurcating ramus-like vessel)  Grafts:  New lesion: 100% ostial and proximal CTO of SVG-RCA (was a very small caliber vessel previously stented.  The nature of this occlusion is such that it does not appear to be acute-the patient not actively having chest pain and no ST elevations, minimal troponin. => Likely not a reasonable target for PCI  SVG- 1st Diag has an anastomotic 50 to 60% stenosis of questionable significance (at this stage, the graft is the same caliber as a target vessel) ->  I chose not to attempt intervention on this location as she is hemodynamically clinically stable at this point. Would not want to complicate issues by potentially occluding another graft.  Patent LIMA-LAD with no left-to-right collaterals  Mildly reduced EF with inferobasal hypokinesis-45 to 50%.  Borderline elevated LVEDP of 15 mmHg  Significant discrepancy between aortic blood pressures and cuff blood pressures of at least 30 mmHg.  Cuff pressures are significantly lower. RECOMMENDATIONS  Return to CCU, would continue amiodarone load, consider  lidocaine if VT recurs.  EP consultation to consider the possibility of ICD.  For future stabilization, could consider the possibility of intervention SVG-1st Diag (albeit a questionable significance), also atherectomy PCI of the OM1 (this is similar in appearance to previous cath in 2018, so this would only be to optimize distal flow - would likely  need ischemic evaluation prior to proceeding with PCI on either lesion).  Prior to initiating pressor support in the ICU, would consider invasive arterial monitoring   DG Chest Port 1 View  Result Date: 09/23/2020 CLINICAL DATA:  Ventricular tachycardia, cardiac arrest status post resuscitation EXAM: PORTABLE CHEST 1 VIEW COMPARISON:  04/10/2018 FINDINGS: Single frontal view of the chest demonstrates stable enlargement the cardiac silhouette, with postsurgical changes from median sternotomy and bypass surgery. Retrocardiac consolidation may reflect atelectasis, infection, or aspiration. Small left pleural effusion. No pneumothorax. No acute displaced fractures. Vascular stent left axillary region. IMPRESSION: 1. Left lower lobe consolidation may reflect atelectasis, infection, or aspiration. 2. Small left pleural effusion. Electronically Signed   By: Randa Ngo M.D.   On: 09/23/2020 19:21   ECHOCARDIOGRAM COMPLETE  Result Date: 09/24/2020    ECHOCARDIOGRAM REPORT   Patient Name:   Kiela MAE Delman Date of Exam: 09/24/2020 Medical Rec #:  [id number]         Height:       64.0 in Accession #:    [id number]        Weight:       170.0 lb Date of Birth:  Jun 28, 1941          BSA:          1.826 m Patient Age:    2 years          BP:           84/65 mmHg Patient Gender: F                 HR:           52 bpm. Exam Location:  Inpatient Procedure: 2D Echo Indications:    cardiac arrest  History:        Patient has prior history of Echocardiogram examinations, most                 recent 05/31/2020. CAD, Prior CABG, end stage renal disease,                  Arrythmias:ventricular tachycardia; Signs/Symptoms:Syncope.  Sonographer:    Johny Chess Referring Phys: [place]  1. Patient had VT/VF arrest within minutes of starting echocardiogram. Remainder of study completed post cath, post arrest.  2. Left ventricular ejection fraction, by estimation, is 45%. The left ventricle has mildly decreased function. The left ventricle demonstrates regional wall motion abnormalities (see scoring diagram/findings for description). Left ventricular diastolic  parameters are consistent with Grade II diastolic dysfunction (pseudonormalization).  3. Right ventricular systolic function is severely reduced. The right ventricular size is moderately enlarged. There is normal pulmonary artery systolic pressure. The estimated right ventricular systolic pressure is 99.2 mmHg.  4. Left atrial size was moderately dilated.  5. Right atrial size was moderately dilated.  6. The mitral valve is degenerative. Mild mitral valve regurgitation. No evidence of mitral stenosis.  7. The aortic valve is grossly normal. There is mild thickening of the aortic valve. Aortic valve regurgitation is not visualized. No aortic stenosis is present.  8. The inferior vena cava is normal in size with <50% respiratory variability, suggesting right atrial pressure of 8 mmHg. FINDINGS  Left Ventricle: Left ventricular ejection fraction, by estimation, is 45%. The left ventricle has mildly decreased function. The left ventricle demonstrates regional wall motion abnormalities. The left ventricular internal cavity size was normal in size. There is no left ventricular hypertrophy. Left ventricular diastolic parameters are consistent with  Grade II diastolic dysfunction (pseudonormalization).  LV Wall Scoring: The basal inferolateral segment and basal inferior segment are akinetic. Right Ventricle: The right ventricular size is moderately enlarged. Right vetricular wall thickness was not well  visualized. Right ventricular systolic function is severely reduced. There is normal pulmonary artery systolic pressure. The tricuspid regurgitant velocity is 2.27 m/s, and with an assumed right atrial pressure of 8 mmHg, the estimated right ventricular systolic pressure is 76.1 mmHg. Left Atrium: Left atrial size was moderately dilated. Right Atrium: Right atrial size was moderately dilated. Pericardium: There is no evidence of pericardial effusion. Mitral Valve: The mitral valve is degenerative in appearance. Mild mitral annular calcification. Mild mitral valve regurgitation. No evidence of mitral valve stenosis. Tricuspid Valve: The tricuspid valve is normal in structure. Tricuspid valve regurgitation is mild. Aortic Valve: The aortic valve is grossly normal. There is mild thickening of the aortic valve. Aortic valve regurgitation is not visualized. No aortic stenosis is present. Pulmonic Valve: The pulmonic valve was not well visualized. Pulmonic valve regurgitation is trivial. Aorta: The aortic root is normal in size and structure. Venous: The inferior vena cava is normal in size with less than 50% respiratory variability, suggesting right atrial pressure of 8 mmHg. IAS/Shunts: No atrial level shunt detected by color flow Doppler.  LEFT VENTRICLE PLAX 2D LVIDd:         4.40 cm Diastology LVIDs:         3.60 cm LV e' medial:    5.77 cm/s LV PW:         0.90 cm LV E/e' medial:  24.4 LV IVS:        0.70 cm LV e' lateral:   7.51 cm/s                        LV E/e' lateral: 18.8  RIGHT VENTRICLE            IVC RV S prime:     4.35 cm/s  IVC diam: 1.80 cm LEFT ATRIUM             Index       RIGHT ATRIUM           Index LA diam:        3.60 cm 1.97 cm/m  RA Area:     16.20 cm LA Vol (A2C):   71.5 ml 39.16 ml/m RA Volume:   41.10 ml  22.51 ml/m LA Vol (A4C):   34.5 ml 18.90 ml/m LA Biplane Vol: 51.6 ml 28.26 ml/m  AORTIC VALVE LVOT Vmax:   84.90 cm/s LVOT Vmean:  57.800 cm/s LVOT VTI:    0.221 m  AORTA Ao Asc  diam: 2.30 cm MITRAL VALVE                TRICUSPID VALVE MV Area (PHT): 2.87 cm     TR Peak grad:   20.6 mmHg MV Decel Time: 264 msec     TR Vmax:        227.00 cm/s MV E velocity: 141.00 cm/s MV A velocity: 80.80 cm/s   SHUNTS MV E/A ratio:  1.75         Systemic VTI: 0.22 m Cherlynn Kaiser MD Electronically signed by Cherlynn Kaiser MD Signature Date/Time: 09/24/2020/4:44:35 PM    Final    [facility] kidney center Tuesday Thursday Saturday Time: 4 hrs EDW 76.5 K 3  Ca 2.5  Micera 100 q 2 weeks   Assessment/Plan:  ESRD-Tuesday Thursday Saturday dialysis [facility] dialysis unit  ANEMIA-receives ESA last hemoglobin 10.3 will follow  MBD-continue binders will follow trend  HTN/VOL-   continue ultrafiltration 1 to 2 L  ACCESS-   right AV fistula   Symptomatic ventricular tachyarrhythmia appreciate cardiology assistant   LOS: [place] @TODAY @8 :13 AM

## 2020-09-26 DIAGNOSIS — I25718 Atherosclerosis of autologous vein coronary artery bypass graft(s) with other forms of angina pectoris: Secondary | ICD-10-CM | POA: Diagnosis not present

## 2020-09-26 DIAGNOSIS — I248 Other forms of acute ischemic heart disease: Secondary | ICD-10-CM | POA: Diagnosis not present

## 2020-09-26 DIAGNOSIS — N186 End stage renal disease: Secondary | ICD-10-CM | POA: Diagnosis not present

## 2020-09-26 DIAGNOSIS — I480 Paroxysmal atrial fibrillation: Secondary | ICD-10-CM | POA: Diagnosis not present

## 2020-09-26 DIAGNOSIS — Z992 Dependence on renal dialysis: Secondary | ICD-10-CM | POA: Diagnosis not present

## 2020-09-26 DIAGNOSIS — I472 Ventricular tachycardia: Secondary | ICD-10-CM | POA: Diagnosis not present

## 2020-09-26 DIAGNOSIS — I2510 Atherosclerotic heart disease of native coronary artery without angina pectoris: Secondary | ICD-10-CM | POA: Diagnosis not present

## 2020-09-26 DIAGNOSIS — R55 Syncope and collapse: Secondary | ICD-10-CM | POA: Diagnosis not present

## 2020-09-26 DIAGNOSIS — E119 Type 2 diabetes mellitus without complications: Secondary | ICD-10-CM | POA: Diagnosis not present

## 2020-09-26 LAB — CBC
HCT: 33.2 % — ABNORMAL LOW (ref 36.0–46.0)
Hemoglobin: 10.2 g/dL — ABNORMAL LOW (ref 12.0–15.0)
MCH: 33.1 pg (ref 26.0–34.0)
MCHC: 30.7 g/dL (ref 30.0–36.0)
MCV: 107.8 fL — ABNORMAL HIGH (ref 80.0–100.0)
Platelets: 124 10*3/uL — ABNORMAL LOW (ref 150–400)
RBC: 3.08 MIL/uL — ABNORMAL LOW (ref 3.87–5.11)
RDW: 15.9 % — ABNORMAL HIGH (ref 11.5–15.5)
WBC: 7.5 10*3/uL (ref 4.0–10.5)
nRBC: 0.5 % — ABNORMAL HIGH (ref 0.0–0.2)

## 2020-09-26 LAB — RENAL FUNCTION PANEL
Albumin: 2.6 g/dL — ABNORMAL LOW (ref 3.5–5.0)
Anion gap: 14 (ref 5–15)
BUN: 29 mg/dL — ABNORMAL HIGH (ref 8–23)
CO2: 31 mmol/L (ref 22–32)
Calcium: 8.7 mg/dL — ABNORMAL LOW (ref 8.9–10.3)
Chloride: 88 mmol/L — ABNORMAL LOW (ref 98–111)
Creatinine, Ser: 5.62 mg/dL — ABNORMAL HIGH (ref 0.44–1.00)
GFR, Estimated: 7 mL/min — ABNORMAL LOW (ref >60)
Glucose, Bld: 178 mg/dL — ABNORMAL HIGH (ref 70–99)
Phosphorus: 4.8 mg/dL — ABNORMAL HIGH (ref 2.5–4.6)
Potassium: 4.5 mmol/L (ref 3.5–5.1)
Sodium: 133 mmol/L — ABNORMAL LOW (ref 135–145)

## 2020-09-26 LAB — GLUCOSE, CAPILLARY
Glucose-Capillary: 167 mg/dL — ABNORMAL HIGH (ref 70–99)
Glucose-Capillary: 181 mg/dL — ABNORMAL HIGH (ref 70–99)
Glucose-Capillary: 147 mg/dL — ABNORMAL HIGH (ref 70–99)
Glucose-Capillary: 132 mg/dL — ABNORMAL HIGH (ref 70–99)

## 2020-09-26 LAB — AMMONIA: Ammonia: 36 umol/L — ABNORMAL HIGH (ref 9–35)

## 2020-09-26 LAB — HEPATITIS B SURFACE ANTIGEN: Hepatitis B Surface Ag: NONREACTIVE

## 2020-09-26 MED ORDER — LIDOCAINE-PRILOCAINE 2.5-2.5 % EX CREA
1.0000 "application " | TOPICAL_CREAM | CUTANEOUS | Status: DC | PRN
  Filled 2020-09-26: qty 5

## 2020-09-26 MED ORDER — ALTEPLASE 2 MG IJ SOLR: 2.0000 mg | Freq: Once | INTRAMUSCULAR | Status: DC | PRN

## 2020-09-26 MED ORDER — SODIUM CHLORIDE 0.9 % IV SOLN: 100.0000 mL | INTRAVENOUS | Status: DC | PRN

## 2020-09-26 MED ORDER — LIDOCAINE HCL (PF) 1 % IJ SOLN: 5.0000 mL | INTRAMUSCULAR | Status: DC | PRN

## 2020-09-26 MED ORDER — HEPARIN SODIUM (PORCINE) 1000 UNIT/ML DIALYSIS
1000.0000 [IU] | INTRAMUSCULAR | Status: DC | PRN
  Filled 2020-09-26: qty 1

## 2020-09-26 MED ORDER — PENTAFLUOROPROP-TETRAFLUOROETH EX AERO: 1.0000 "application " | INHALATION_SPRAY | CUTANEOUS | Status: DC | PRN

## 2020-09-26 NOTE — Progress Notes (Signed)
Daily Progress Note   Patient Name: [name]       Date: 09/26/2020 DOB: 06-01-1941  Age: 80 y.o. MRN#: [id number] Attending Physician: Donne Hazel, MD Primary Care Physician: Josetta Huddle, MD Admit Date: 09/23/2020  Reason for Consultation/Follow-up: goals of care  Subjective: Received report from primary RN - patient has had no ectopy this shift. Remains on full dose amidoarone infusion. Remains on 6L oxygen. Minimal oral intake (taking sips of supplements). Continues to have episodes of chest pain, decreased O2 sats, and decreased HR with movement or turning. Scheduled for dialysis this evening.   No family at bedside currently. Patient remains very fragile and high risk to decompensate secondary to her multiple comorbidities.   Length of Stay: 3  Current Medications: Scheduled Meds:  . allopurinol  100 mg Oral q AM  . atorvastatin  40 mg Oral QHS  . brimonidine  1 drop Right Eye QHS   And  . brimonidine  2 drop Left Eye QHS  . calcium carbonate  1 tablet Oral Q lunch  . Chlorhexidine Gluconate Cloth  6 each Topical Daily  . Chlorhexidine Gluconate Cloth  6 each Topical Q0600  . cinacalcet  30 mg Oral Once per day on Tue Thu  . citalopram  20 mg Oral q AM  . clopidogrel  75 mg Oral q AM  . feeding supplement (NEPRO CARB STEADY)  237 mL Oral TID BM  . insulin aspart  0-5 Units Subcutaneous QHS  . insulin aspart  0-6 Units Subcutaneous TID WC  . latanoprost  1 drop Both Eyes QHS  . multivitamin  1 tablet Oral QHS  . polycarbophil  625 mg Oral BID WC  . sevelamer carbonate  1,600 mg Oral BID WC  . sodium chloride flush  3 mL Intravenous Q12H  . sodium chloride flush  3 mL Intravenous Q12H  . sodium chloride flush  3 mL Intravenous Q12H  . timolol  1 drop Left Eye  QHS    Continuous Infusions: . sodium chloride    . sodium chloride Stopped (09/25/20 1025)  . sodium chloride    . sodium chloride    . sodium chloride    . sodium chloride    . amiodarone 30 mg/hr (09/26/20 1039)    PRN Meds: sodium chloride, sodium chloride, sodium  chloride, sodium chloride, acetaminophen, alteplase, famotidine, heparin, lidocaine (PF), lidocaine-prilocaine, morphine injection, nitroGLYCERIN, oxyCODONE-acetaminophen, pentafluoroprop-tetrafluoroeth, sodium chloride flush, sodium chloride flush  Physical Exam Vitals reviewed.  Constitutional:      General: She is sleeping. She is not in acute distress.    Appearance: She is ill-appearing.  Cardiovascular:     Rate and Rhythm: Bradycardia present.  Pulmonary:     Effort: Pulmonary effort is normal.             Vital Signs: BP (!) 101/26   Pulse (!) 45   Temp 98.6 F (37 C) (Oral)   Resp 13   Ht 5\' 4"  (1.626 m)   Wt 79.9 kg   SpO2 96%   BMI 30.24 kg/m  SpO2: SpO2: 96 % O2 Device: O2 Device: Nasal Cannula O2 Flow Rate: O2 Flow Rate (L/min): 6 L/min  Intake/output summary:   Intake/Output Summary (Last 24 hours) at 09/26/2020 1606 Last data filed at 09/26/2020 1300 Gross per 24 hour  Intake 506.48 ml  Output --  Net 506.48 ml   LBM: Last BM Date: 09/23/20 Baseline Weight: Weight: 77.1 kg Most recent weight: Weight: 79.9 kg       Palliative Assessment/Data: PPS 20%      Palliative Care Assessment & Plan   HPI/Patient Profile: 80 y.o. female  with past medical history of ESRD on dialysis TTS, coronary artery disease s/p CABG, carotid stenosis, atrial fibrillation, previous history of non-sustained v-tach, DM2, HTN, HLD, and hearing loss presenting to the emergency department on 09/23/2020 with multiple episodes of syncope. She reportedly had an episode right after dialysis. Had another episode at home, became unresponsive, and family started CPR.  When EMS arrived, monitor showed nonsustained  v-tach bigeminy, lidocaine infusion was started.  ED Course: On arrival to ED patient was back in sinus rhythm. Chest x-ray showed left lower lobe consolidation probably atelectasis versus infection or aspiration. She was admitted to [facility] for further evaluation of syncopal episodes.   On 3/13, patient was undergoing echocardiogram and developed symptomatic v-tach requiring CPR. She underwent emergent cardiac cath with findings of new 100% stenosis in multiple vessels. Patient was seen by EP, but is not a candidate for ICD.   Assessment: - recurrent symptomatic vtach, not a candidate for ICD - CAD, with new findings of 100% stenosis in multiple vessels - paroxysmal a-fib, not a candidate for anticoagulation - ESRD - anemia  Recommendations/Plan:  DNR/DNI as previously documented  Continue current full scope treatment  If patient becomes too unstable for dialysis, would recommend transition to comfort care  PMT will continue to follow  Goals of Care and Additional Recommendations:  Limitations on Scope of Treatment: Full Scope Treatment  Code Status: DNR/DNI  Prognosis:  poor  Discharge Planning:  Anticipated hospital death  Care plan was discussed with primary RN  Thank you for allowing the Palliative Medicine Team to assist in the care of this patient.   Total Time 15 minutes Prolonged Time Billed  no       Greater than 50%  of this time was spent counseling and coordinating care related to the above assessment and plan.  Lavena Bullion, NP  Please contact Palliative Medicine Team phone at [phone] for questions and concerns.

## 2020-09-26 NOTE — Progress Notes (Signed)
Progress Note  Patient Name: [name] Date of Encounter: 09/26/2020  Primary Cardiologist: Larae Grooms, MD   Subjective   More awake and alert today.  Breathing better.  She still has lots of pain, but is in better spirits.  Significant events of yesterday included being made full DNR DNI with no escalation of care.  No anginal chest pain and no further sustained runs of VT.  Inpatient Medications    Scheduled Meds: . allopurinol  100 mg Oral q AM  . atorvastatin  40 mg Oral QHS  . brimonidine  1 drop Right Eye QHS   And  . brimonidine  2 drop Left Eye QHS  . calcium carbonate  1 tablet Oral Q lunch  . Chlorhexidine Gluconate Cloth  6 each Topical Daily  . Chlorhexidine Gluconate Cloth  6 each Topical Q0600  . cinacalcet  30 mg Oral Once per day on Tue Thu  . citalopram  20 mg Oral q AM  . clopidogrel  75 mg Oral q AM  . feeding supplement (NEPRO CARB STEADY)  237 mL Oral TID BM  . insulin aspart  0-5 Units Subcutaneous QHS  . insulin aspart  0-6 Units Subcutaneous TID WC  . latanoprost  1 drop Both Eyes QHS  . multivitamin  1 tablet Oral QHS  . polycarbophil  625 mg Oral BID WC  . sevelamer carbonate  1,600 mg Oral BID WC  . sodium chloride flush  3 mL Intravenous Q12H  . sodium chloride flush  3 mL Intravenous Q12H  . sodium chloride flush  3 mL Intravenous Q12H  . timolol  1 drop Left Eye QHS   Continuous Infusions: . sodium chloride    . sodium chloride Stopped (09/25/20 1025)  . sodium chloride    . sodium chloride    . amiodarone 30 mg/hr (09/26/20 1039)   PRN Meds: sodium chloride, sodium chloride, acetaminophen, famotidine, lidocaine-prilocaine, morphine injection, nitroGLYCERIN, oxyCODONE-acetaminophen, sodium chloride flush, sodium chloride flush   Vital Signs    Vitals:   09/26/20 0840 09/26/20 0900 09/26/20 0928 09/26/20 1000  BP:      Pulse: (!) 41 (!) 44 (!) 45 (!) 44  Resp: _0 Temp:      TempSrc:      SpO2: 98% 94%  96% 96%  Weight:      Height:        Intake/Output Summary (Last 24 hours) at 09/26/2020 1102 Last data filed at 09/26/2020 0900 Gross per 24 hour  Intake 846.51 ml  Output -  Net 846.51 ml   Filed Weights   09/23/20 1858 09/25/20 0500  Weight: 77.1 kg 79.9 kg    Telemetry    Now sinus bradycardia mostly in the 40s as of roughly midnight.  Throughout the course the day she would have some  ventricular bigeminy and pauses in the setting of-much less frequent PVCs.  Acreased vagal tone.- Personally Reviewed  ECG    No new EKG- Personally Reviewed  Physical Exam   GEN:  More awake and alert today.  Very hard of hearing.  Seems less fatigued.  Smiling.  Still has lots of pain from CPR.   Neck:  Difficult assessment no obvious JVD Cardiac:  Bradycardia with normal rhythm.  Occasional ectopy.  Normal S1 and S2.  Distant heart sounds difficult to assess because of significant chest discomfort with auscultation but no obvious M/R/G. Respiratory:  Mostly CTA B, mild crackles in the bases on anterior  fields.  Minimal respiratory effort that because of pain on breathing. GI: Soft, nonte soft/NT/ND/NABS. MS:  Trivial edema.  Significant right shoulder pain chest pain from CPR. Neuro:   More awake and alert.  Very fatigued.  A&O x3  Labs    Chemistry Recent Labs  Lab 09/23/20 1937 09/24/20 0315 09/24/20 1059 09/25/20 0424 09/26/20 0513  NA 137  --  139 133* 133*  K 3.3*  --  3.7 4.6 4.5  CL 93*  --  94* 90* 88*  CO2 34*  --  34* 30 31  GLUCOSE 200*  --  157* 194* 178*  BUN 7*  --  12 19 29*  CREATININE 2.74*   < > 3.62* 4.46* 5.62*  CALCIUM 7.7*  --  8.1* 8.5* 8.7*  PROT 6.2*  --   --   --   --   ALBUMIN 3.0*  --   --   --  2.6*  AST 43*  --   --   --   --   ALT 27  --   --   --   --   ALKPHOS 75  --   --   --   --   BILITOT 0.6  --   --   --   --   GFRNONAA 17*   < > 12* 9* 7*  ANIONGAP 10  --  _0 < > = values in this interval not displayed.      Hematology Recent Labs  Lab 09/23/20 1937 09/24/20 0315 09/25/20 0424  WBC 5.1 6.1 8.1  RBC 3.25* 3.23* 3.09*  HGB 11.0* 11.0* 10.3*  HCT 34.5* 34.2* 33.7*  MCV 106.2* 105.9* 109.1*  MCH 33.8 34.1* 33.3  MCHC 31.9 32.2 30.6  RDW 15.0 15.1 15.8*  PLT 129* 123* 129*    Cardiac EnzymesNo results for input(s): TROPONINI in the last 168 hours. No results for input(s): TROPIPOC in the last 168 hours.   BNPNo results for input(s): BNP, PROBNP in the last 168 hours.   DDimer No results for input(s): DDIMER in the last 168 hours.   Radiology    CT ANGIO CHEST PE W OR WO CONTRAST  Result Date: 09/24/2020 CLINICAL DATA:  History of syncope and ventricular tachycardia EXAM: CT ANGIOGRAPHY CHEST WITH CONTRAST TECHNIQUE: Multidetector CT imaging of the chest was performed using the standard protocol during bolus administration of intravenous contrast. Multiplanar CT image reconstructions and MIPs were obtained to evaluate the vascular anatomy. CONTRAST:  3m OMNIPAQUE IOHEXOL 350 MG/ML SOLN COMPARISON:  09/23/2020 FINDINGS: Cardiovascular: This is a technically adequate opacification of the pulmonary vasculature. No filling defects or pulmonary emboli. The heart is enlarged without pericardial effusion. Normal caliber of the thoracic aorta. Extensive atherosclerosis of the aorta and coronary vasculature. Postsurgical changes from previous CABG. Mediastinum/Nodes: No enlarged mediastinal, hilar, or axillary lymph nodes. Thyroid gland, trachea, and esophagus demonstrate no significant findings. Lungs/Pleura: There are moderate bilateral pleural effusions volume estimated less than 1 L each. Dense bilateral dependent lower lobe consolidation consistent with atelectasis. No pneumothorax. Central airways are patent. Upper Abdomen: No acute abnormality. Musculoskeletal: There are minimally displaced acute right anterolateral fourth through seventh rib fractures, likely related to recent CPR. Numerous  other prior bilateral healed rib fractures are also noted. No other acute bony abnormalities. Reconstructed images demonstrate no additional findings. Review of the MIP images confirms the above findings. IMPRESSION: 1. No evidence of pulmonary embolus. 2. Moderate bilateral pleural effusions with dense bilateral lower  lobe atelectasis. 3. Right anterolateral fourth through seventh acute rib fractures consistent with recent CPR. 4.  Aortic Atherosclerosis (ICD10-I70.0). Electronically Signed   By: Randa Ngo M.D.   On: 09/24/2020 21:17   MR BRAIN WO CONTRAST  Result Date: 09/24/2020 CLINICAL DATA:  53-year-old female with recurrent syncope and confusion. EXAM: MRI HEAD WITHOUT CONTRAST TECHNIQUE: Multiplanar, multiecho pulse sequences of the brain and surrounding structures were obtained without intravenous contrast. COMPARISON:  Head CT 09/23/2020.  Brain MRI 07/13/2015. FINDINGS: Brain: No restricted diffusion to suggest acute infarction. No midline shift, mass effect, evidence of mass lesion, ventriculomegaly, extra-axial collection or acute intracranial hemorrhage. Cervicomedullary junction and pituitary are within normal limits. Mild generalized cerebral volume loss since 2016. But gray and white matter signal remains largely normal for age. No cortical encephalomalacia. But there are occasional chronic micro hemorrhages in the brain, including the posterior right frontal lobe (series 14, image 37) and left cerebellum (image 13). Furthermore, there is intrinsic T1 hyperintensity in the bilateral globus pallidus since 2016 (series 16, image 30 on the left. Vascular: Major intracranial vascular flow voids are stable since 2016. 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 Mild associated visible cervical spinal stenosis, including at C2-C3. Heterogeneous bone marrow signal but within normal limits. Sinuses/Orbits:  Negative orbits. Mild to moderate right maxillary sinus mucosal thickening is new, but other paranasal sinuses are improved compared to 2016. Other: Trace right mastoid fluid.  Negative visible nasopharynx. IMPRESSION: 1. Intrinsic T1 hyperintensity has developed in the bilateral globus pallidus since a 2016 MRI. This is most frequently associated with Hepatic Insufficiency / Cirrhosis. Correlation with serum Ammonia levels and liver function tests is recommended. 2. No other acute intracranial abnormality. Mild generalized cerebral volume loss since 2016 with occasional chronic micro hemorrhages in the brain. 3. 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 Electronically Signed   By: Genevie Ann M.D.   On: 09/24/2020 11:34   CARDIAC CATHETERIZATION  Result Date: 09/24/2020  -----------NATIVE VESSELS-------------------------  Ost LAD to Prox LAD lesion is 95% stenosed. Prox LAD lesion is 100% stenosed with 100% stenosed side branch in Ost 1st Diag.  Ost 1st Mrg lesion is 75% stenosed.  Prox Cx to Mid Cx lesion is 100% stenosed (just after 1st Mrg) with side branch in Ost 2nd Mrg.  Prox RCA lesion is 95% stenosed. Mid RCA to Dist RCA lesion is 100% stenosed.  ------------GRAFTS--------------------------  SVG-RCA graft was visualized by angiography and is small. Origin to Mid Graft lesion is 100% stenosed. - Previously stented Graft  SVG-1stDiag graft was visualized by angiography. Dist Graft to Insertion lesion is 55% stenosed.  LIMA-dLAD graft was visualized by angiography and is normal in caliber. The graft exhibits no disease. There is no competitive flow  ---------------------------  There is mild left ventricular systolic dysfunction. The left ventricular ejection fraction is 45-50% by visual estimate.  LV end diastolic pressure is mildly elevated.  There is a significant difference in arterial pressures versus cuff pressures-arterial pressures are at  least 30- 40 mmHg higher.  SUMMARY  Severe native vessel CAD:  100% LAD following a 95% calcified lesion prior to SP1;  100% mid RCA following severe 80% stenosis,  100% AV groove LCx after OM1 with bridging collaterals to small PL branch,  75 to 80% calcified ostial OM1 (large bifurcating ramus-like vessel)  Grafts:  New lesion: 100% ostial and proximal CTO of SVG-RCA (was a very  small caliber vessel previously stented.  The nature of this occlusion is such that it does not appear to be acute-the patient not actively having chest pain and no ST elevations, minimal troponin. => Likely not a reasonable target for PCI  SVG- 1st Diag has an anastomotic 50 to 60% stenosis of questionable significance (at this stage, the graft is the same caliber as a target vessel) ->  I chose not to attempt intervention on this location as she is hemodynamically clinically stable at this point. Would not want to complicate issues by potentially occluding another graft.  Patent LIMA-LAD with no left-to-right collaterals  Mildly reduced EF with inferobasal hypokinesis-45 to 50%.  Borderline elevated LVEDP of 15 mmHg  Significant discrepancy between aortic blood pressures and cuff blood pressures of at least 30 mmHg.  Cuff pressures are significantly lower. RECOMMENDATIONS  Return to CCU, would continue amiodarone load, consider lidocaine if VT recurs.  EP consultation to consider the possibility of ICD.  For future stabilization, could consider the possibility of intervention SVG-1st Diag (albeit a questionable significance), also atherectomy PCI of the OM1 (this is similar in appearance to previous cath in 2018, so this would only be to optimize distal flow - would likely need ischemic evaluation prior to proceeding with PCI on either lesion).  Prior to initiating pressor support in the ICU, would consider invasive arterial monitoring   ECHOCARDIOGRAM COMPLETE  Result Date: 09/24/2020    ECHOCARDIOGRAM REPORT    Patient Name:   [name] Date of Exam: 09/24/2020 Medical Rec #:  [id number]         Height:       64.0 in Accession #:    [id number]        Weight:       170.0 lb Date of Birth:  1941-04-07          BSA:          1.826 m Patient Age:    19 years          BP:           84/65 mmHg Patient Gender: F                 HR:           52 bpm. Exam Location:  Inpatient Procedure: 2D Echo Indications:    cardiac arrest  History:        Patient has prior history of Echocardiogram examinations, most                 recent 05/31/2020. CAD, Prior CABG, end stage renal disease,                 Arrythmias:ventricular tachycardia; Signs/Symptoms:Syncope.  Sonographer:    Johny Chess Referring Phys: [place]  1. Patient had VT/VF arrest within minutes of starting echocardiogram. Remainder of study completed post cath, post arrest.  2. Left ventricular ejection fraction, by estimation, is 45%. The left ventricle has mildly decreased function. The left ventricle demonstrates regional wall motion abnormalities (see scoring diagram/findings for description). Left ventricular diastolic  parameters are consistent with Grade II diastolic dysfunction (pseudonormalization).  3. Right ventricular systolic function is severely reduced. The right ventricular size is moderately enlarged. There is normal pulmonary artery systolic pressure. The estimated right ventricular systolic pressure is 93.9 mmHg.  4. Left atrial size was moderately dilated.  5. Right atrial size was moderately dilated.  6. The mitral valve is degenerative. Mild  mitral valve regurgitation. No evidence of mitral stenosis.  7. The aortic valve is grossly normal. There is mild thickening of the aortic valve. Aortic valve regurgitation is not visualized. No aortic stenosis is present.  8. The inferior vena cava is normal in size with <50% respiratory variability, suggesting right atrial pressure of 8 mmHg. FINDINGS  Left Ventricle: Left  ventricular ejection fraction, by estimation, is 45%. The left ventricle has mildly decreased function. The left ventricle demonstrates regional wall motion abnormalities. The left ventricular internal cavity size was normal in size. There is no left ventricular hypertrophy. Left ventricular diastolic parameters are consistent with Grade II diastolic dysfunction (pseudonormalization).  LV Wall Scoring: The basal inferolateral segment and basal inferior segment are akinetic. Right Ventricle: The right ventricular size is moderately enlarged. Right vetricular wall thickness was not well visualized. Right ventricular systolic function is severely reduced. There is normal pulmonary artery systolic pressure. The tricuspid regurgitant velocity is 2.27 m/s, and with an assumed right atrial pressure of 8 mmHg, the estimated right ventricular systolic pressure is 94.1 mmHg. Left Atrium: Left atrial size was moderately dilated. Right Atrium: Right atrial size was moderately dilated. Pericardium: There is no evidence of pericardial effusion. Mitral Valve: The mitral valve is degenerative in appearance. Mild mitral annular calcification. Mild mitral valve regurgitation. No evidence of mitral valve stenosis. Tricuspid Valve: The tricuspid valve is normal in structure. Tricuspid valve regurgitation is mild. Aortic Valve: The aortic valve is grossly normal. There is mild thickening of the aortic valve. Aortic valve regurgitation is not visualized. No aortic stenosis is present. Pulmonic Valve: The pulmonic valve was not well visualized. Pulmonic valve regurgitation is trivial. Aorta: The aortic root is normal in size and structure. Venous: The inferior vena cava is normal in size with less than 50% respiratory variability, suggesting right atrial pressure of 8 mmHg. IAS/Shunts: No atrial level shunt detected by color flow Doppler.  LEFT VENTRICLE PLAX 2D LVIDd:         4.40 cm Diastology LVIDs:         3.60 cm LV e' medial:     5.77 cm/s LV PW:         0.90 cm LV E/e' medial:  24.4 LV IVS:        0.70 cm LV e' lateral:   7.51 cm/s                        LV E/e' lateral: 18.8  RIGHT VENTRICLE            IVC RV S prime:     4.35 cm/s  IVC diam: 1.80 cm LEFT ATRIUM             Index       RIGHT ATRIUM           Index LA diam:        3.60 cm 1.97 cm/m  RA Area:     16.20 cm LA Vol (A2C):   71.5 ml 39.16 ml/m RA Volume:   41.10 ml  22.51 ml/m LA Vol (A4C):   34.5 ml 18.90 ml/m LA Biplane Vol: 51.6 ml 28.26 ml/m  AORTIC VALVE LVOT Vmax:   84.90 cm/s LVOT Vmean:  57.800 cm/s LVOT VTI:    0.221 m  AORTA Ao Asc diam: 2.30 cm MITRAL VALVE                TRICUSPID VALVE MV Area (PHT): 2.87 cm  TR Peak grad:   20.6 mmHg MV Decel Time: 264 msec     TR Vmax:        227.00 cm/s MV E velocity: 141.00 cm/s MV A velocity: 80.80 cm/s   SHUNTS MV E/A ratio:  1.75         Systemic VTI: 0.22 m Cherlynn Kaiser MD Electronically signed by Cherlynn Kaiser MD Signature Date/Time: 09/24/2020/4:44:35 PM    Final     Cardiac Studies    Echo 09/24/2020 summary: EF 45%.  Basal inferolateral and basal inferior akinesis (consistent with occluded SVG-RCA).  Moderate enlarged RV with severely dysfunction.  Moderate biatrial enlargement.  MAC noted but no stenosis and mild MR.  Mild aortic sclerosis but no stenosis.   Cardiac cath 09/24/2020:   Severe native vessel CAD:  ? 100% LAD following a 95% calcified lesion prior to SP1;  ? 100% mid RCA following severe 80% stenosis,  ? 100% AV groove LCx after OM1 with bridging collaterals to small PL branch,   75 to 80% calcified ostial OM1 (large bifurcating ramus-like vessel)  Grafts: ? New lesion: 100% ostial and proximal CTO of SVG-RCA (was a very small caliber vessel previously stented.  The nature of this occlusion is such that it does not appear to be acute-the patient not actively having chest pain and no ST elevations, minimal troponin. => Likely not a reasonable target for PCI ? SVG- 1st Diag  has an anastomotic 50 to 60% stenosis of questionable significance (at this stage, the graft is the same caliber as a target vessel) ->   I chose not to attempt intervention on this location as she is hemodynamically clinically stable at this point. Would not want to complicate issues by potentially occluding another graft. ? Patent LIMA-LAD with no left-to-right collaterals  Mildly reduced EF with inferobasal hypokinesis-45 to 50%.  Borderline elevated LVEDP of 15 mmHg  Significant discrepancy between aortic blood pressures and cuff blood pressures of at least 30 mmHg.  Cuff pressures are significantly lower.     Patient Profile     74F with CAD s/p CABG x3 (LIMA-LAD, SVG-diag, SVG-RCA), prior VF arrest in the setting of SVG occlusion , ESRD on iHD, post op AF, TIA, CAS s/p L CAE (2009), MDD, DM2, diverticulosis, GERD, glaucoma, and HTN who presents following recurrent LOC and subsequent witnessed arrest.  Recurrent VT on 09/24/2020-resuscitated with 90 seconds of CPR, no shock.  Amiodarone drip continued along with lidocaine.  Cardiac cath revealed likely subacute occlusion of SVG-RCA.  Not PCI target.  No other clear targets to explain ischemic VT.  Assessment & Plan   Principal Problem:   Ventricular tachycardia (HCC) Active Problems:   Paroxysmal atrial fibrillation (HCC)   Coronary artery disease involving native heart without angina pectoris   Coronary artery disease involving autologous vein bypass graft   Demand ischemia of myocardium (HCC)   Hyperlipidemia associated with type 2 diabetes mellitus ([place])   Essential hypertension   Hypokalemia   QT prolongation   ESRD on dialysis ([place])   Anemia of chronic disease   Diabetes mellitus type 2 in nonobese (HCC)   Syncope and collapse   - CAD s/p CABG with prior VT/VF arrest with cath showing occluded SVG requiring PCI in 2019.  Echo with mildly reduced EF of 45%.  Severely reduced RV function.  Moderate biatrial enlargement.   Mildly elevated right atrial pressures.  Principal Problem:   Ventricular tachycardia ([place]) as etiology for Syncope and collapse - concern for  Ischemic VT - EMS notes indicate WCT rate 200, terminating prior to defibrillation. She did require CPR.   Recurrent event on 09/24/2020 roughly 11 AM short V. tach degenerating to VF/pulseless VT-90 minutes of CPR with no defibrillation.  Restored ROSC.  No angina.  Urgent cath with newly occluded SVG-RCA but otherwise stable..  Continue on amiodarone load, we have stopped lidocaine.  Hopefully with dialysis, lidocaine will be removed in the system.  My anticipation now will be to continue IV lidocaine overnight -> then converting to oral in the morning.  EP consult, not ICD candidate. -Continue amiodarone load.>   Treat pain from CPR.  Demand ischemia: Initially minimal troponin, elevated postarrest.  Initial troponin elevation was minimal, there was pretty significant elevation following her arrest, suspect that this is all post arrest related and not ACS.  She never had anginal symptoms.  Active Problems:      Coronary artery disease involving native heart without angina pectoris /   Coronary artery disease involving autologous vein bypass graft  Relook cath showed subacute occlusion of SVG-RCA (this was a very small caliber graft to minimal distal RCA.  There is diffuse disease when a stent was placed.  By appearance it does not seem to be acute in nature, regardless would not be a PCI target). ->  The fact that she did not present with angina with me that this is probably not acute.  Wall motion on echo is consistent with these findings.  Troponin elevation is most consistent with demand ischemia from cardiac arrest with existing CAD not likely ACS.  Would not continue with heparin  Continue aspirin Plavix and statin.  Obviously no beta-blocker because of amiodarone and bradycardia.  Blood pressures not able to tolerate more  medications.     Hyperlipidemia associated with type 2 diabetes mellitus ([place]) -Per TR[facility], on standing insulin with sliding scale.  On atorvastatin.   Essential hypertension -not on any antihypertensives, blood pressure stable. - BP unreliable measured from leg, left femoral arterial line placed overnight by fellow for both blood draws and pressure monitoring.       ESRD on dialysis (HCC)/ Hypokalemia  Need to maintain potassium over 4 magnesium over 2.  Is due for dialysis on Tuesday.     Paroxysmal atrial fibrillation (HCC) -was postop.  No recurrence seen on monitor.  Not on DOAC.   Anemia of chronic disease -> stable   Diabetes mellitus type 2 in nonobese [facility])  For now organ to monitor to see return of neuro status and ensure that her ventricular tachycardia is quiesced sent.  Plan to convert to oral amiodarone tomorrow.  Major issue is monitoring her blood pressures and pain control. If she dialyzes, I suspect that she may perk up a little bit.  Continues to be DNR-DNI.   For questions or updates, please contact [place] Please consult [email or web address] for contact info under        Signed, [name], MD  09/26/2020, 11:02 AM

## 2020-09-26 NOTE — Progress Notes (Signed)
PT Cancellation Note  Patient Details Name: [name] MRN: [id number] DOB: 30-Aug-1940   Cancelled Treatment:    Reason Eval/Treat Not Completed: Patient not medically ready (pt remains with fem art line and not medically appropriate, discussed with RN. Will sign off and await new order)   Paco Cislo B Jorrell Kuster 09/26/2020, 8:16 AM  Bayard Males, PT Acute Rehabilitation Services Pager: [phone] Office: [phone]

## 2020-09-26 NOTE — Plan of Care (Signed)
  Problem: Education: Goal: Knowledge of General Education information will improve Description Including pain rating scale, medication(s)/side effects and non-pharmacologic comfort measures Outcome: Progressing   

## 2020-09-26 NOTE — Progress Notes (Signed)
[facility] ROUNDING NOTE   Subjective:   Brief history: This is an 80 year old lady with a history of end-stage renal disease Tuesday Thursday Saturday dialysis.  She also has carotid stenosis atrial fibrillation previous history of sustained V. tach diabetes mellitus essential hypertension hyperlipidemia.  She is unfortunately had multiple syncopal episodes secondary to recurrent ventricular tachyarrhythmia.  She was scheduled to undergo left heart catheterization 09/25/2020 and sustained an episode of symptomatic V. tach prompting CODE BLUE.  She required brief chest compressions with ROSC.  She underwent catheterization 09/24/2020 with new 100% ostial and proximal CTO of SVG-RCA.  Recommendations are for EP and possible ICD.  She dialyzes at [facility].  CT angio no evidence of pulmonary embolus.  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  Appreciate assistance from palliative medicine.  Blood pressure 99/27 pulse 42 temperature 97.5 O2 sats 98% 6 L nasal cannula  Sodium 133 potassium 4.5 chloride 88 CO2 to 31 BUN 29 creatinine 5.62 glucose 178 calcium 8.7 phosphorus 4.8 albumin 2.6 hemoglobin 10.3  Objective:  Vital signs in last 24 hours:  Temp:  [97.5 F (36.4 C)-98.2 F (36.8 C)] 97.5 F (36.4 C) (03/14 2338) Pulse Rate:  [31-60] 38 (03/15 0200) Resp:  [11-21] 14 (03/15 0200) BP: (101)/(26) 101/26 (03/14 1040) SpO2:  [93 %-100 %] 100 % (03/15 0200) Arterial Line BP: (87-143)/(21-40) 105/27 (03/15 0200)  Weight change:  Filed Weights   09/23/20 1858 09/25/20 0500  Weight: 77.1 kg 79.9 kg    Intake/Output: I/O last 3 completed shifts: In: 2134.5 [P.O.:530; I.V.:1074.4; NG/GT:480; IV Piggyback:50.1] Out: -    Intake/Output this shift:  Total I/O In: 116.5 [I.V.:116.5] Out: -   Frail elderly CVS-bradycardic no obvious murmurs rubs or gallops RS-clear anteriorly ABD- BS present  soft non-distended EXT- no edema right AV fistula   Basic Metabolic Panel: Recent Labs  Lab 09/23/20 1937 09/24/20 0315 09/24/20 1059 09/25/20 0424 09/26/20 0513  NA 137  --  139 133* 133*  K 3.3*  --  3.7 4.6 4.5  CL 93*  --  94* 90* 88*  CO2 34*  --  34* 30 31  GLUCOSE 200*  --  157* 194* 178*  BUN 7*  --  12 19 29*  CREATININE 2.74* 3.09* 3.62* 4.46* 5.62*  CALCIUM 7.7*  --  8.1* 8.5* 8.7*  MG 1.7  --  1.8 2.7*  --   PHOS 2.2*  --   --   --  4.8*    Liver Function Tests: Recent Labs  Lab 09/23/20 1937 09/26/20 0513  AST 43*  --   ALT 27  --   ALKPHOS 75  --   BILITOT 0.6  --   PROT 6.2*  --   ALBUMIN 3.0* 2.6*   No results for input(s): LIPASE, AMYLASE in the last 168 hours. Recent Labs  Lab 09/26/20 0513  AMMONIA 36*    CBC: Recent Labs  Lab 09/23/20 1937 09/24/20 0315 09/25/20 0424  WBC 5.1 6.1 8.1  NEUTROABS 4.1  --   --   HGB 11.0* 11.0* 10.3*  HCT 34.5* 34.2* 33.7*  MCV 106.2* 105.9* 109.1*  PLT 129* 123* 129*    Cardiac Enzymes: No results for input(s): CKTOTAL, CKMB, CKMBINDEX, TROPONINI in the last 168 hours.  BNP: Invalid input(s): POCBNP  CBG: Recent Labs  Lab 09/24/20 2135 09/25/20 0636 09/25/20 1102 09/25/20 1642 09/25/20 2216  GLUCAP 209* 162* 220* 177* 157*  Microbiology: Results for orders placed or performed during the hospital encounter of 09/23/20  Resp Panel by RT-PCR (Flu A&B, Covid) Nasopharyngeal Swab     Status: None   Collection Time: 09/23/20  7:59 PM   Specimen: Nasopharyngeal Swab; Nasopharyngeal(NP) swabs in vial transport medium  Result Value Ref Range Status   SARS Coronavirus 2 by RT PCR NEGATIVE NEGATIVE Final    Comment: (NOTE) SARS-CoV-2 target nucleic acids are NOT DETECTED.  The SARS-CoV-2 RNA is generally detectable in upper respiratory specimens during the acute phase of infection. The lowest concentration of SARS-CoV-2 viral copies this assay can detect is 138 copies/mL. A negative  result does not preclude SARS-Cov-2 infection and should not be used as the sole basis for treatment or other patient management decisions. A negative result may occur with  improper specimen collection/handling, submission of specimen other than nasopharyngeal swab, presence of viral mutation(s) within the areas targeted by this assay, and inadequate number of viral copies(<138 copies/mL). A negative result must be combined with clinical observations, patient history, and epidemiological information. The expected result is Negative.  Fact Sheet for Patients:  EntrepreneurPulse.com.au  Fact Sheet for Healthcare Providers:  IncredibleEmployment.be  This test is no t yet approved or cleared by the [place] FDA and  has been authorized for detection and/or diagnosis of SARS-CoV-2 by FDA under an Emergency Use Authorization (EUA). This EUA will remain  in effect (meaning this test can be used) for the duration of the COVID-19 declaration under Section 564(b)(1) of the Act, 21 U.S.C.section 360bbb-3(b)(1), unless the authorization is terminated  or revoked sooner.       Influenza A by PCR NEGATIVE NEGATIVE Final   Influenza B by PCR NEGATIVE NEGATIVE Final    Comment: (NOTE) The Xpert Xpress SARS-CoV-2/FLU/RSV plus assay is intended as an aid in the diagnosis of influenza from Nasopharyngeal swab specimens and should not be used as a sole basis for treatment. Nasal washings and aspirates are unacceptable for Xpert Xpress SARS-CoV-2/FLU/RSV testing.  Fact Sheet for Patients: EntrepreneurPulse.com.au  Fact Sheet for Healthcare Providers: IncredibleEmployment.be  This test is not yet approved or cleared by the [place] FDA and has been authorized for detection and/or diagnosis of SARS-CoV-2 by FDA under an Emergency Use Authorization (EUA). This EUA will remain in effect (meaning this test can be used)  for the duration of the COVID-19 declaration under Section 564(b)(1) of the Act, 21 U.S.C. section 360bbb-3(b)(1), unless the authorization is terminated or revoked.  Performed at [facility], [place] [address] [place], [place] [address]   MRSA PCR Screening     Status: None   Collection Time: 09/24/20 12:08 PM   Specimen: Nasopharyngeal  Result Value Ref Range Status   MRSA by PCR NEGATIVE NEGATIVE Final    Comment:        The GeneXpert MRSA Assay (FDA approved for NASAL specimens only), is one component of a comprehensive MRSA colonization surveillance program. It is not intended to diagnose MRSA infection nor to guide or monitor treatment for MRSA infections. Performed at [facility], [place] [address] [place], [place] [address]     Coagulation Studies: Recent Labs    09/23/20 1937  LABPROT 14.0  INR 1.1    Urinalysis: No results for input(s): COLORURINE, LABSPEC, PHURINE, GLUCOSEU, HGBUR, BILIRUBINUR, KETONESUR, PROTEINUR, UROBILINOGEN, NITRITE, LEUKOCYTESUR in the last 72 hours.  Invalid input(s): APPERANCEUR    Imaging: CT ANGIO CHEST PE W OR WO CONTRAST  Result Date: 09/24/2020 CLINICAL  DATA:  History of syncope and ventricular tachycardia EXAM: CT ANGIOGRAPHY CHEST WITH CONTRAST TECHNIQUE: Multidetector CT imaging of the chest was performed using the standard protocol during bolus administration of intravenous contrast. Multiplanar CT image reconstructions and MIPs were obtained to evaluate the vascular anatomy. CONTRAST:  71mL OMNIPAQUE IOHEXOL 350 MG/ML SOLN COMPARISON:  09/23/2020 FINDINGS: Cardiovascular: This is a technically adequate opacification of the pulmonary vasculature. No filling defects or pulmonary emboli. The heart is enlarged without pericardial effusion. Normal caliber of the thoracic aorta. Extensive atherosclerosis of the aorta and coronary vasculature. Postsurgical changes from previous CABG. Mediastinum/Nodes: No enlarged  mediastinal, hilar, or axillary lymph nodes. Thyroid gland, trachea, and esophagus demonstrate no significant findings. Lungs/Pleura: There are moderate bilateral pleural effusions volume estimated less than 1 L each. Dense bilateral dependent lower lobe consolidation consistent with atelectasis. No pneumothorax. Central airways are patent. Upper Abdomen: No acute abnormality. Musculoskeletal: There are minimally displaced acute right anterolateral fourth through seventh rib fractures, likely related to recent CPR. Numerous other prior bilateral healed rib fractures are also noted. No other acute bony abnormalities. Reconstructed images demonstrate no additional findings. Review of the MIP images confirms the above findings. IMPRESSION: 1. No evidence of pulmonary embolus. 2. Moderate bilateral pleural effusions with dense bilateral lower lobe atelectasis. 3. Right anterolateral fourth through seventh acute rib fractures consistent with recent CPR. 4.  Aortic Atherosclerosis (ICD10-I70.0). Electronically Signed   By: Randa Ngo M.D.   On: 09/24/2020 21:17   MR BRAIN WO CONTRAST  Result Date: 09/24/2020 CLINICAL DATA:  46-year-old female with recurrent syncope and confusion. EXAM: MRI HEAD WITHOUT CONTRAST TECHNIQUE: Multiplanar, multiecho pulse sequences of the brain and surrounding structures were obtained without intravenous contrast. COMPARISON:  Head CT 09/23/2020.  Brain MRI 07/13/2015. FINDINGS: Brain: No restricted diffusion to suggest acute infarction. No midline shift, mass effect, evidence of mass lesion, ventriculomegaly, extra-axial collection or acute intracranial hemorrhage. Cervicomedullary junction and pituitary are within normal limits. Mild generalized cerebral volume loss since 2016. But gray and white matter signal remains largely normal for age. No cortical encephalomalacia. But there are occasional chronic micro hemorrhages in the brain, including the posterior right frontal lobe (series  14, image 37) and left cerebellum (image 13). Furthermore, there is intrinsic T1 hyperintensity in the bilateral globus pallidus since 2016 (series 16, image 30 on the left. Vascular: Major intracranial vascular flow voids are stable since 2016. 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 Mild associated visible cervical spinal stenosis, including at C2-C3. Heterogeneous bone marrow signal but within normal limits. Sinuses/Orbits: Negative orbits. Mild to moderate right maxillary sinus mucosal thickening is new, but other paranasal sinuses are improved compared to 2016. Other: Trace right mastoid fluid.  Negative visible nasopharynx. IMPRESSION: 1. Intrinsic T1 hyperintensity has developed in the bilateral globus pallidus since a 2016 MRI. This is most frequently associated with Hepatic Insufficiency / Cirrhosis. Correlation with serum Ammonia levels and liver function tests is recommended. 2. No other acute intracranial abnormality. Mild generalized cerebral volume loss since 2016 with occasional chronic micro hemorrhages in the brain. 3. 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 Electronically Signed   By: Genevie Ann M.D.   On: 09/24/2020 11:34   CARDIAC CATHETERIZATION  Result Date: 09/24/2020  -----------NATIVE VESSELS-------------------------  Ost LAD to Prox LAD lesion is 95% stenosed. Prox LAD lesion is 100% stenosed with 100% stenosed side branch in Ost 1st Diag.  Ost 1st  Mrg lesion is 75% stenosed.  Prox Cx to Mid Cx lesion is 100% stenosed (just after 1st Mrg) with side branch in Ost 2nd Mrg.  Prox RCA lesion is 95% stenosed. Mid RCA to Dist RCA lesion is 100% stenosed.  ------------GRAFTS--------------------------  SVG-RCA graft was visualized by angiography and is small. Origin to Mid Graft lesion is 100% stenosed. - Previously stented Graft  SVG-1stDiag  graft was visualized by angiography. Dist Graft to Insertion lesion is 55% stenosed.  LIMA-dLAD graft was visualized by angiography and is normal in caliber. The graft exhibits no disease. There is no competitive flow  ---------------------------  There is mild left ventricular systolic dysfunction. The left ventricular ejection fraction is 45-50% by visual estimate.  LV end diastolic pressure is mildly elevated.  There is a significant difference in arterial pressures versus cuff pressures-arterial pressures are at least 30- 40 mmHg higher.  SUMMARY  Severe native vessel CAD:  100% LAD following a 95% calcified lesion prior to SP1;  100% mid RCA following severe 80% stenosis,  100% AV groove LCx after OM1 with bridging collaterals to small PL branch,  75 to 80% calcified ostial OM1 (large bifurcating ramus-like vessel)  Grafts:  New lesion: 100% ostial and proximal CTO of SVG-RCA (was a very small caliber vessel previously stented.  The nature of this occlusion is such that it does not appear to be acute-the patient not actively having chest pain and no ST elevations, minimal troponin. => Likely not a reasonable target for PCI  SVG- 1st Diag has an anastomotic 50 to 60% stenosis of questionable significance (at this stage, the graft is the same caliber as a target vessel) ->  I chose not to attempt intervention on this location as she is hemodynamically clinically stable at this point. Would not want to complicate issues by potentially occluding another graft.  Patent LIMA-LAD with no left-to-right collaterals  Mildly reduced EF with inferobasal hypokinesis-45 to 50%.  Borderline elevated LVEDP of 15 mmHg  Significant discrepancy between aortic blood pressures and cuff blood pressures of at least 30 mmHg.  Cuff pressures are significantly lower. RECOMMENDATIONS  Return to CCU, would continue amiodarone load, consider lidocaine if VT recurs.  EP consultation to consider the possibility of ICD.   For future stabilization, could consider the possibility of intervention SVG-1st Diag (albeit a questionable significance), also atherectomy PCI of the OM1 (this is similar in appearance to previous cath in 2018, so this would only be to optimize distal flow - would likely need ischemic evaluation prior to proceeding with PCI on either lesion).  Prior to initiating pressor support in the ICU, would consider invasive arterial monitoring   ECHOCARDIOGRAM COMPLETE  Result Date: 09/24/2020    ECHOCARDIOGRAM REPORT   Patient Name:   Saran MAE Zidek Date of Exam: 09/24/2020 Medical Rec #:  [id number]         Height:       64.0 in Accession #:    [id number]        Weight:       170.0 lb Date of Birth:  [birth year]          BSA:          1.826 m Patient Age:    59 years          BP:           84/65 mmHg Patient Gender: F                 HR:  52 bpm. Exam Location:  Inpatient Procedure: 2D Echo Indications:    cardiac arrest  History:        Patient has prior history of Echocardiogram examinations, most                 recent 05/31/2020. CAD, Prior CABG, end stage renal disease,                 Arrythmias:ventricular tachycardia; Signs/Symptoms:Syncope.  Sonographer:    Johny Chess Referring Phys: [place]  1. Patient had VT/VF arrest within minutes of starting echocardiogram. Remainder of study completed post cath, post arrest.  2. Left ventricular ejection fraction, by estimation, is 45%. The left ventricle has mildly decreased function. The left ventricle demonstrates regional wall motion abnormalities (see scoring diagram/findings for description). Left ventricular diastolic  parameters are consistent with Grade II diastolic dysfunction (pseudonormalization).  3. Right ventricular systolic function is severely reduced. The right ventricular size is moderately enlarged. There is normal pulmonary artery systolic pressure. The estimated right ventricular systolic pressure is 70.3  mmHg.  4. Left atrial size was moderately dilated.  5. Right atrial size was moderately dilated.  6. The mitral valve is degenerative. Mild mitral valve regurgitation. No evidence of mitral stenosis.  7. The aortic valve is grossly normal. There is mild thickening of the aortic valve. Aortic valve regurgitation is not visualized. No aortic stenosis is present.  8. The inferior vena cava is normal in size with <50% respiratory variability, suggesting right atrial pressure of 8 mmHg. FINDINGS  Left Ventricle: Left ventricular ejection fraction, by estimation, is 45%. The left ventricle has mildly decreased function. The left ventricle demonstrates regional wall motion abnormalities. The left ventricular internal cavity size was normal in size. There is no left ventricular hypertrophy. Left ventricular diastolic parameters are consistent with Grade II diastolic dysfunction (pseudonormalization).  LV Wall Scoring: The basal inferolateral segment and basal inferior segment are akinetic. Right Ventricle: The right ventricular size is moderately enlarged. Right vetricular wall thickness was not well visualized. Right ventricular systolic function is severely reduced. There is normal pulmonary artery systolic pressure. The tricuspid regurgitant velocity is 2.27 m/s, and with an assumed right atrial pressure of 8 mmHg, the estimated right ventricular systolic pressure is 50.0 mmHg. Left Atrium: Left atrial size was moderately dilated. Right Atrium: Right atrial size was moderately dilated. Pericardium: There is no evidence of pericardial effusion. Mitral Valve: The mitral valve is degenerative in appearance. Mild mitral annular calcification. Mild mitral valve regurgitation. No evidence of mitral valve stenosis. Tricuspid Valve: The tricuspid valve is normal in structure. Tricuspid valve regurgitation is mild. Aortic Valve: The aortic valve is grossly normal. There is mild thickening of the aortic valve. Aortic valve  regurgitation is not visualized. No aortic stenosis is present. Pulmonic Valve: The pulmonic valve was not well visualized. Pulmonic valve regurgitation is trivial. Aorta: The aortic root is normal in size and structure. Venous: The inferior vena cava is normal in size with less than 50% respiratory variability, suggesting right atrial pressure of 8 mmHg. IAS/Shunts: No atrial level shunt detected by color flow Doppler.  LEFT VENTRICLE PLAX 2D LVIDd:         4.40 cm Diastology LVIDs:         3.60 cm LV e' medial:    5.77 cm/s LV PW:         0.90 cm LV E/e' medial:  24.4 LV IVS:        0.70 cm  LV e' lateral:   7.51 cm/s                        LV E/e' lateral: 18.8  RIGHT VENTRICLE            IVC RV S prime:     4.35 cm/s  IVC diam: 1.80 cm LEFT ATRIUM             Index       RIGHT ATRIUM           Index LA diam:        3.60 cm 1.97 cm/m  RA Area:     16.20 cm LA Vol (A2C):   71.5 ml 39.16 ml/m RA Volume:   41.10 ml  22.51 ml/m LA Vol (A4C):   34.5 ml 18.90 ml/m LA Biplane Vol: 51.6 ml 28.26 ml/m  AORTIC VALVE LVOT Vmax:   84.90 cm/s LVOT Vmean:  57.800 cm/s LVOT VTI:    0.221 m  AORTA Ao Asc diam: 2.30 cm MITRAL VALVE                TRICUSPID VALVE MV Area (PHT): 2.87 cm     TR Peak grad:   20.6 mmHg MV Decel Time: 264 msec     TR Vmax:        227.00 cm/s MV E velocity: 141.00 cm/s MV A velocity: 80.80 cm/s   SHUNTS MV E/A ratio:  1.75         Systemic VTI: 0.22 m Cherlynn Kaiser MD Electronically signed by Cherlynn Kaiser MD Signature Date/Time: 09/24/2020/4:44:35 PM    Final      Medications:   . sodium chloride    . sodium chloride Stopped (09/25/20 1025)  . sodium chloride    . sodium chloride    . amiodarone 30 mg/hr (09/25/20 2242)   . allopurinol  100 mg Oral q AM  . atorvastatin  40 mg Oral QHS  . brimonidine  1 drop Right Eye QHS   And  . brimonidine  2 drop Left Eye QHS  . calcium carbonate  1 tablet Oral Q lunch  . Chlorhexidine Gluconate Cloth  6 each Topical Daily  .  Chlorhexidine Gluconate Cloth  6 each Topical Q0600  . cinacalcet  30 mg Oral Once per day on Tue Thu  . citalopram  20 mg Oral q AM  . clopidogrel  75 mg Oral q AM  . feeding supplement (NEPRO CARB STEADY)  237 mL Oral TID BM  . insulin aspart  0-5 Units Subcutaneous QHS  . insulin aspart  0-6 Units Subcutaneous TID WC  . latanoprost  1 drop Both Eyes QHS  . multivitamin  1 tablet Oral QHS  . polycarbophil  625 mg Oral BID WC  . sevelamer carbonate  1,600 mg Oral BID WC  . sodium chloride flush  3 mL Intravenous Q12H  . sodium chloride flush  3 mL Intravenous Q12H  . sodium chloride flush  3 mL Intravenous Q12H  . timolol  1 drop Left Eye QHS   sodium chloride, sodium chloride, acetaminophen, famotidine, lidocaine-prilocaine, morphine injection, nitroGLYCERIN, oxyCODONE-acetaminophen, sodium chloride flush, sodium chloride flush  Dialysis prescription  Dialysis [facility] Tuesday Thursday Saturday Time: 4 hrs EDW 76.5 K 3  Ca 2.5  Micera 100 q 2 weeks   Assessment/Plan:  ESRD-Tuesday Thursday Saturday dialysis [facility] dialysis unit.  Next dialysis treatment 09/26/2020  ANEMIA-receives ESA, hemoglobin stable will  follow  MBD-continue binders will follow trend  HTN/VOL-   continue ultrafiltration 1 to 2 L  ACCESS-   right AV fistula   Symptomatic ventricular tachyarrhythmia appreciate cardiology assistant 2D echo 09/24/2020 EF 45%.  Cardiac catheterization 09/24/2020 severe native vessel disease with 100% new ostial and proximal lesion of SVG-RCA.  EP consultation per cardiology.    LOS: [place] @TODAY @6 :54 AM

## 2020-09-26 NOTE — Progress Notes (Signed)
PROGRESS NOTE    Viha Kriegel  QQV:[id number] DOB: 12-21-1940 DOA: 09/23/2020 PCP: Josetta Huddle, MD    Brief Narrative:  80 y.o. female with medical history significant of end-stage renal disease on hemodialysis TTS, coronary artery disease, carotid stenosis, atrial fibrillation, previous history of nonsustained V. tach, diabetes, essential hypertension, hyperlipidemia, hard of hearing who presented with multiple episodes of syncope prior to admit, later found to have vtach by EMS. Pt was admitted for further work up  Assessment & Plan:   Principal Problem:   Ventricular tachycardia ([place]) Active Problems:   Essential hypertension   Hyperlipidemia associated with type 2 diabetes mellitus (HCC)   Paroxysmal atrial fibrillation (HCC)   Coronary artery disease involving native heart without angina pectoris   Hypokalemia   QT prolongation   ESRD on dialysis ([place])   Anemia of chronic disease   Diabetes mellitus type 2 in nonobese (HCC)   Syncope and collapse   Coronary artery disease involving autologous vein bypass graft   Demand ischemia of myocardium ([place])  #1 multiple syncopal episodes likely secondary to recurrent symptomatic vtach:  -Cardiology was consulted and is following -Initial plan was for [facility] on 3/14. However, on the afternoon of 3/13, patient developed developed symptomatic vtach, prompting Code Blue. See Code Blue documentation. -Pt underwent LHC on 3/13 with findings of new 100% ostial and prox CTO of SVG-RCA with recommendation for continued amiodarone load -Pt was seen by EP, however pt is not candidate for ICD -Cardiology is following. Pt is now DNR -Appreciate input by Palliative Care. Plan to cont full scope of treatment. Should patient no longer be stable for dialysis, will need further discussions regarding comfort care at that time  #2 paroxysmal atrial fibrillation:  -Noted to be not a candidate for anticoagulation due to previous brain bleed. -Pt is  continued with amiodarone as per above   #3 diabetes:  -Sliding scale insulin as needed -a1c of 5.0 noted -Patient remains stable at present  #4 essential hypertension:  -Blood pressures currently stable -Family is familiar with patient's baseline hx of low BP  #5 end-stage renal disease:  -Continue per nephrology.   -Patient to continue with HD as tolerated per Nephrology  #6 anemia of chronic disease:  -hgb trends have been stable  #7 hypokalemia: -Replaced -Cont to follow lytes and replace as needed with goal K>4 and Mg>2  #8 DNR -See above. Unfortunately, pt has only limited options per Cardiology recs regarding recurrent vtach/vfib arrest -Code status was addressed and pt's wishes are noted to be DNR/DNI -Appreciate input by Palliative Care. Per above, plan is for full scope of treatment for now. If pt becomes too unstable for HD, then there will be a need for discussions regarding comfort care at that time  DVT prophylaxis: Heparin subq Code Status: Full Family Communication: Pt in room, pt's daughter currently at bedside  Status is: Inpatient  Remains inpatient appropriate because:Hemodynamically unstable, Ongoing diagnostic testing needed not appropriate for outpatient work up and Inpatient level of care appropriate due to severity of illness   Dispo: The patient is from: Home              Anticipated d/c is to: Home              Patient currently is not medically stable to d/c.   Difficult to place patient No  Consultants:   Cardiology  Nephrology  Palliative Care  Procedures:   [facility] 3/13  Antimicrobials: Anti-infectives (From admission, onward)  None      Subjective: Still complaining of chest pains  Objective: Vitals:   09/26/20 1200 09/26/20 1300 09/26/20 1340 09/26/20 1400  BP:      Pulse: (!) 44 (!) 44 (!) 46 (!) 44  Resp: 12 11 13 16   Temp:      TempSrc:      SpO2: 96% 95% 95% 94%  Weight:      Height:         Intake/Output Summary (Last 24 hours) at 09/26/2020 1516 Last data filed at 09/26/2020 1300 Gross per 24 hour  Intake 523.17 ml  Output -  Net 523.17 ml   Filed Weights   09/23/20 1858 09/25/20 0500  Weight: 77.1 kg 79.9 kg    Examination: General exam: Conversant, in no acute distress Respiratory system: normal chest rise, clear, no audible wheezing Cardiovascular system: regular rhythm, s1-s2 Gastrointestinal system: Nondistended, nontender, pos BS Central nervous system: No seizures, no tremors Extremities: No cyanosis, no joint deformities Skin: No rashes, no pallor Psychiatry: Affect normal // no auditory hallucinations   Data Reviewed: I have personally reviewed following labs and imaging studies  CBC: Recent Labs  Lab 09/23/20 1937 09/24/20 0315 09/25/20 0424 09/26/20 1250  WBC 5.1 6.1 8.1 7.5  NEUTROABS 4.1  --   --   --   HGB 11.0* 11.0* 10.3* 10.2*  HCT 34.5* 34.2* 33.7* 33.2*  MCV 106.2* 105.9* 109.1* 107.8*  PLT 129* 123* 129* 784*   Basic Metabolic Panel: Recent Labs  Lab 09/23/20 1937 09/24/20 0315 09/24/20 1059 09/25/20 0424 09/26/20 0513  NA 137  --  139 133* 133*  K 3.3*  --  3.7 4.6 4.5  CL 93*  --  94* 90* 88*  CO2 34*  --  34* 30 31  GLUCOSE 200*  --  157* 194* 178*  BUN 7*  --  12 19 29*  CREATININE 2.74* 3.09* 3.62* 4.46* 5.62*  CALCIUM 7.7*  --  8.1* 8.5* 8.7*  MG 1.7  --  1.8 2.7*  --   PHOS 2.2*  --   --   --  4.8*   GFR: Estimated Creatinine Clearance: 8.2 mL/min (A) (by C-G formula based on SCr of 5.62 mg/dL (H)). Liver Function Tests: Recent Labs  Lab 09/23/20 1937 09/26/20 0513  AST 43*  --   ALT 27  --   ALKPHOS 75  --   BILITOT 0.6  --   PROT 6.2*  --   ALBUMIN 3.0* 2.6*   No results for input(s): LIPASE, AMYLASE in the last 168 hours. Recent Labs  Lab 09/26/20 0513  AMMONIA 36*   Coagulation Profile: Recent Labs  Lab 09/23/20 1937  INR 1.1   Cardiac Enzymes: No results for input(s): CKTOTAL, CKMB,  CKMBINDEX, TROPONINI in the last 168 hours. BNP (last 3 results) No results for input(s): PROBNP in the last 8760 hours. HbA1C: Recent Labs    09/24/20 0315  HGBA1C 5.0   CBG: Recent Labs  Lab 09/25/20 1102 09/25/20 1642 09/25/20 2216 09/26/20 0823 09/26/20 1119  GLUCAP 220* 177* 157* 167* 181*   Lipid Profile: No results for input(s): CHOL, HDL, LDLCALC, TRIG, CHOLHDL, LDLDIRECT in the last 72 hours. Thyroid Function Tests: Recent Labs    09/24/20 1059  TSH 2.440   Anemia Panel: No results for input(s): VITAMINB12, FOLATE, FERRITIN, TIBC, IRON, RETICCTPCT in the last 72 hours. Sepsis Labs: No results for input(s): PROCALCITON, LATICACIDVEN in the last 168 hours.  Recent Results (from the past  240 hour(s))  Resp Panel by RT-PCR (Flu A&B, Covid) Nasopharyngeal Swab     Status: None   Collection Time: 09/23/20  7:59 PM   Specimen: Nasopharyngeal Swab; Nasopharyngeal(NP) swabs in vial transport medium  Result Value Ref Range Status   SARS Coronavirus 2 by RT PCR NEGATIVE NEGATIVE Final    Comment: (NOTE) SARS-CoV-2 target nucleic acids are NOT DETECTED.  The SARS-CoV-2 RNA is generally detectable in upper respiratory specimens during the acute phase of infection. The lowest concentration of SARS-CoV-2 viral copies this assay can detect is 138 copies/mL. A negative result does not preclude SARS-Cov-2 infection and should not be used as the sole basis for treatment or other patient management decisions. A negative result may occur with  improper specimen collection/handling, submission of specimen other than nasopharyngeal swab, presence of viral mutation(s) within the areas targeted by this assay, and inadequate number of viral copies(<138 copies/mL). A negative result must be combined with clinical observations, patient history, and epidemiological information. The expected result is Negative.  Fact Sheet for Patients:   EntrepreneurPulse.com.au  Fact Sheet for Healthcare Providers:  IncredibleEmployment.be  This test is no t yet approved or cleared by the [place] FDA and  has been authorized for detection and/or diagnosis of SARS-CoV-2 by FDA under an Emergency Use Authorization (EUA). This EUA will remain  in effect (meaning this test can be used) for the duration of the COVID-19 declaration under Section 564(b)(1) of the Act, 21 U.S.C.section 360bbb-3(b)(1), unless the authorization is terminated  or revoked sooner.       Influenza A by PCR NEGATIVE NEGATIVE Final   Influenza B by PCR NEGATIVE NEGATIVE Final    Comment: (NOTE) The Xpert Xpress SARS-CoV-2/FLU/RSV plus assay is intended as an aid in the diagnosis of influenza from Nasopharyngeal swab specimens and should not be used as a sole basis for treatment. Nasal washings and aspirates are unacceptable for Xpert Xpress SARS-CoV-2/FLU/RSV testing.  Fact Sheet for Patients: EntrepreneurPulse.com.au  Fact Sheet for Healthcare Providers: IncredibleEmployment.be  This test is not yet approved or cleared by the [place] FDA and has been authorized for detection and/or diagnosis of SARS-CoV-2 by FDA under an Emergency Use Authorization (EUA). This EUA will remain in effect (meaning this test can be used) for the duration of the COVID-19 declaration under Section 564(b)(1) of the Act, 21 U.S.C. section 360bbb-3(b)(1), unless the authorization is terminated or revoked.  Performed at [facility], [place] [address] [place], [place] [address]   MRSA PCR Screening     Status: None   Collection Time: 09/24/20 12:08 PM   Specimen: Nasopharyngeal  Result Value Ref Range Status   MRSA by PCR NEGATIVE NEGATIVE Final    Comment:        The GeneXpert MRSA Assay (FDA approved for NASAL specimens only), is one component of a comprehensive MRSA  colonization surveillance program. It is not intended to diagnose MRSA infection nor to guide or monitor treatment for MRSA infections. Performed at [facility], [place] [address] [place], [place] [address]      Radiology Studies: CT ANGIO CHEST PE W OR WO CONTRAST  Result Date: 09/24/2020 CLINICAL DATA:  History of syncope and ventricular tachycardia EXAM: CT ANGIOGRAPHY CHEST WITH CONTRAST TECHNIQUE: Multidetector CT imaging of the chest was performed using the standard protocol during bolus administration of intravenous contrast. Multiplanar CT image reconstructions and MIPs were obtained to evaluate the vascular anatomy. CONTRAST:  33mL OMNIPAQUE IOHEXOL 350 MG/ML SOLN COMPARISON:  09/23/2020 FINDINGS: Cardiovascular: This is a technically adequate opacification of the pulmonary vasculature. No filling defects or pulmonary emboli. The heart is enlarged without pericardial effusion. Normal caliber of the thoracic aorta. Extensive atherosclerosis of the aorta and coronary vasculature. Postsurgical changes from previous CABG. Mediastinum/Nodes: No enlarged mediastinal, hilar, or axillary lymph nodes. Thyroid gland, trachea, and esophagus demonstrate no significant findings. Lungs/Pleura: There are moderate bilateral pleural effusions volume estimated less than 1 L each. Dense bilateral dependent lower lobe consolidation consistent with atelectasis. No pneumothorax. Central airways are patent. Upper Abdomen: No acute abnormality. Musculoskeletal: There are minimally displaced acute right anterolateral fourth through seventh rib fractures, likely related to recent CPR. Numerous other prior bilateral healed rib fractures are also noted. No other acute bony abnormalities. Reconstructed images demonstrate no additional findings. Review of the MIP images confirms the above findings. IMPRESSION: 1. No evidence of pulmonary embolus. 2. Moderate bilateral pleural effusions with dense bilateral lower lobe  atelectasis. 3. Right anterolateral fourth through seventh acute rib fractures consistent with recent CPR. 4.  Aortic Atherosclerosis (ICD10-I70.0). Electronically Signed   By: Randa Ngo M.D.   On: 09/24/2020 21:17   ECHOCARDIOGRAM COMPLETE  Result Date: 09/24/2020    ECHOCARDIOGRAM REPORT   Patient Name:   [name] Date of Exam: 09/24/2020 Medical Rec #:  [id number]         Height:       64.0 in Accession #:    [id number]        Weight:       170.0 lb Date of Birth:  April 21, 1941          BSA:          1.826 m Patient Age:    24 years          BP:           84/65 mmHg Patient Gender: F                 HR:           52 bpm. Exam Location:  Inpatient Procedure: 2D Echo Indications:    cardiac arrest  History:        Patient has prior history of Echocardiogram examinations, most                 recent 05/31/2020. CAD, Prior CABG, end stage renal disease,                 Arrythmias:ventricular tachycardia; Signs/Symptoms:Syncope.  Sonographer:    Johny Chess Referring Phys: [place]  1. Patient had VT/VF arrest within minutes of starting echocardiogram. Remainder of study completed post cath, post arrest.  2. Left ventricular ejection fraction, by estimation, is 45%. The left ventricle has mildly decreased function. The left ventricle demonstrates regional wall motion abnormalities (see scoring diagram/findings for description). Left ventricular diastolic  parameters are consistent with Grade II diastolic dysfunction (pseudonormalization).  3. Right ventricular systolic function is severely reduced. The right ventricular size is moderately enlarged. There is normal pulmonary artery systolic pressure. The estimated right ventricular systolic pressure is 37.1 mmHg.  4. Left atrial size was moderately dilated.  5. Right atrial size was moderately dilated.  6. The mitral valve is degenerative. Mild mitral valve regurgitation. No evidence of mitral stenosis.  7. The aortic valve  is grossly normal. There is mild thickening of the aortic valve. Aortic valve regurgitation is not visualized. No aortic stenosis is present.  8. The inferior vena  cava is normal in size with <50% respiratory variability, suggesting right atrial pressure of 8 mmHg. FINDINGS  Left Ventricle: Left ventricular ejection fraction, by estimation, is 45%. The left ventricle has mildly decreased function. The left ventricle demonstrates regional wall motion abnormalities. The left ventricular internal cavity size was normal in size. There is no left ventricular hypertrophy. Left ventricular diastolic parameters are consistent with Grade II diastolic dysfunction (pseudonormalization).  LV Wall Scoring: The basal inferolateral segment and basal inferior segment are akinetic. Right Ventricle: The right ventricular size is moderately enlarged. Right vetricular wall thickness was not well visualized. Right ventricular systolic function is severely reduced. There is normal pulmonary artery systolic pressure. The tricuspid regurgitant velocity is 2.27 m/s, and with an assumed right atrial pressure of 8 mmHg, the estimated right ventricular systolic pressure is 70.6 mmHg. Left Atrium: Left atrial size was moderately dilated. Right Atrium: Right atrial size was moderately dilated. Pericardium: There is no evidence of pericardial effusion. Mitral Valve: The mitral valve is degenerative in appearance. Mild mitral annular calcification. Mild mitral valve regurgitation. No evidence of mitral valve stenosis. Tricuspid Valve: The tricuspid valve is normal in structure. Tricuspid valve regurgitation is mild. Aortic Valve: The aortic valve is grossly normal. There is mild thickening of the aortic valve. Aortic valve regurgitation is not visualized. No aortic stenosis is present. Pulmonic Valve: The pulmonic valve was not well visualized. Pulmonic valve regurgitation is trivial. Aorta: The aortic root is normal in size and structure. Venous:  The inferior vena cava is normal in size with less than 50% respiratory variability, suggesting right atrial pressure of 8 mmHg. IAS/Shunts: No atrial level shunt detected by color flow Doppler.  LEFT VENTRICLE PLAX 2D LVIDd:         4.40 cm Diastology LVIDs:         3.60 cm LV e' medial:    5.77 cm/s LV PW:         0.90 cm LV E/e' medial:  24.4 LV IVS:        0.70 cm LV e' lateral:   7.51 cm/s                        LV E/e' lateral: 18.8  RIGHT VENTRICLE            IVC RV S prime:     4.35 cm/s  IVC diam: 1.80 cm LEFT ATRIUM             Index       RIGHT ATRIUM           Index LA diam:        3.60 cm 1.97 cm/m  RA Area:     16.20 cm LA Vol (A2C):   71.5 ml 39.16 ml/m RA Volume:   41.10 ml  22.51 ml/m LA Vol (A4C):   34.5 ml 18.90 ml/m LA Biplane Vol: 51.6 ml 28.26 ml/m  AORTIC VALVE LVOT Vmax:   84.90 cm/s LVOT Vmean:  57.800 cm/s LVOT VTI:    0.221 m  AORTA Ao Asc diam: 2.30 cm MITRAL VALVE                TRICUSPID VALVE MV Area (PHT): 2.87 cm     TR Peak grad:   20.6 mmHg MV Decel Time: 264 msec     TR Vmax:        227.00 cm/s MV E velocity: 141.00 cm/s MV A velocity: 80.80 cm/s   SHUNTS  MV E/A ratio:  1.75         Systemic VTI: 0.22 m Cherlynn Kaiser MD Electronically signed by Cherlynn Kaiser MD Signature Date/Time: 09/24/2020/4:44:35 PM    Final     Scheduled Meds: . allopurinol  100 mg Oral q AM  . atorvastatin  40 mg Oral QHS  . brimonidine  1 drop Right Eye QHS   And  . brimonidine  2 drop Left Eye QHS  . calcium carbonate  1 tablet Oral Q lunch  . Chlorhexidine Gluconate Cloth  6 each Topical Daily  . Chlorhexidine Gluconate Cloth  6 each Topical Q0600  . cinacalcet  30 mg Oral Once per day on Tue Thu  . citalopram  20 mg Oral q AM  . clopidogrel  75 mg Oral q AM  . feeding supplement (NEPRO CARB STEADY)  237 mL Oral TID BM  . insulin aspart  0-5 Units Subcutaneous QHS  . insulin aspart  0-6 Units Subcutaneous TID WC  . latanoprost  1 drop Both Eyes QHS  . multivitamin  1 tablet  Oral QHS  . polycarbophil  625 mg Oral BID WC  . sevelamer carbonate  1,600 mg Oral BID WC  . sodium chloride flush  3 mL Intravenous Q12H  . sodium chloride flush  3 mL Intravenous Q12H  . sodium chloride flush  3 mL Intravenous Q12H  . timolol  1 drop Left Eye QHS   Continuous Infusions: . sodium chloride    . sodium chloride Stopped (09/25/20 1025)  . sodium chloride    . sodium chloride    . sodium chloride    . sodium chloride    . amiodarone 30 mg/hr (09/26/20 1039)     LOS: 3 days   Marylu Lund, MD [facility] Pager On Amion  If 7PM-7AM, please contact night-coverage 09/26/2020, 3:16 PM

## 2020-09-27 DIAGNOSIS — I472 Ventricular tachycardia: Secondary | ICD-10-CM | POA: Diagnosis not present

## 2020-09-27 DIAGNOSIS — I25710 Atherosclerosis of autologous vein coronary artery bypass graft(s) with unstable angina pectoris: Secondary | ICD-10-CM | POA: Diagnosis not present

## 2020-09-27 DIAGNOSIS — I2510 Atherosclerotic heart disease of native coronary artery without angina pectoris: Secondary | ICD-10-CM | POA: Diagnosis not present

## 2020-09-27 DIAGNOSIS — I248 Other forms of acute ischemic heart disease: Secondary | ICD-10-CM | POA: Diagnosis not present

## 2020-09-27 DIAGNOSIS — D638 Anemia in other chronic diseases classified elsewhere: Secondary | ICD-10-CM | POA: Diagnosis not present

## 2020-09-27 DIAGNOSIS — Z7189 Other specified counseling: Secondary | ICD-10-CM

## 2020-09-27 DIAGNOSIS — R627 Adult failure to thrive: Secondary | ICD-10-CM

## 2020-09-27 DIAGNOSIS — E119 Type 2 diabetes mellitus without complications: Secondary | ICD-10-CM

## 2020-09-27 DIAGNOSIS — R55 Syncope and collapse: Secondary | ICD-10-CM | POA: Diagnosis not present

## 2020-09-27 LAB — GLUCOSE, CAPILLARY
Glucose-Capillary: 140 mg/dL — ABNORMAL HIGH (ref 70–99)
Glucose-Capillary: 170 mg/dL — ABNORMAL HIGH (ref 70–99)
Glucose-Capillary: 177 mg/dL — ABNORMAL HIGH (ref 70–99)
Glucose-Capillary: 201 mg/dL — ABNORMAL HIGH (ref 70–99)

## 2020-09-27 LAB — MAGNESIUM: Magnesium: 2.1 mg/dL (ref 1.7–2.4)

## 2020-09-27 MED ORDER — CHLORHEXIDINE GLUCONATE CLOTH 2 % EX PADS
6.0000 | MEDICATED_PAD | Freq: Every day | CUTANEOUS | Status: DC
  Administered 2020-09-30 – 2020-10-09 (×9): 6 via TOPICAL

## 2020-09-27 NOTE — Plan of Care (Signed)
  Problem: Clinical Measurements: Goal: Ability to maintain clinical measurements within normal limits will improve Outcome: Progressing Goal: Will remain free from infection Outcome: Progressing Goal: Diagnostic test results will improve Outcome: Progressing Goal: Respiratory complications will improve Outcome: Progressing Goal: Cardiovascular complication will be avoided Outcome: Progressing   Problem: Coping: Goal: Level of anxiety will decrease Outcome: Progressing   Problem: Pain Managment: Goal: General experience of comfort will improve Outcome: Progressing   Problem: Safety: Goal: Ability to remain free from injury will improve Outcome: Progressing   Problem: Skin Integrity: Goal: Risk for impaired skin integrity will decrease Outcome: Progressing   Problem: Clinical Measurements: Goal: Will remain free from infection Outcome: Progressing Goal: Diagnostic test results will improve Outcome: Progressing

## 2020-09-27 NOTE — Progress Notes (Signed)
Husband at bedside, pt opened eyes, smiled and  reached for his hand.  Waiting for daughter to arrive

## 2020-09-27 NOTE — Progress Notes (Signed)
[facility] ROUNDING NOTE   Subjective:   Brief history: This is an 80 year old lady with a history of end-stage renal disease Tuesday Thursday Saturday dialysis.  She also has carotid stenosis atrial fibrillation previous history of sustained V. tach diabetes mellitus essential hypertension hyperlipidemia.  She is unfortunately had multiple syncopal episodes secondary to recurrent ventricular tachyarrhythmia.  She was scheduled to undergo left heart catheterization 09/25/2020 and sustained an episode of symptomatic V. tach prompting CODE BLUE.  She required brief chest compressions with ROSC.  She underwent catheterization 09/24/2020 with new 100% ostial and proximal CTO of SVG-RCA.  Recommendations are for EP and possible ICD.  She dialyzes at [facility] center.  CT angio no evidence of pulmonary embolus.  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  Appreciate assistance from palliative medicine.  She underwent successful dialysis 09/26/2020 with removal of 2 L next dialysis will be 09/28/2020  Blood pressure 124/20 pulse 93 temperature 98.7 O2 sats on 6 L nasal cannula 100%  IV amiodarone  Sodium 136 potassium 3.8 chloride 96 CO2 28 BUN 15 creatinine 3.48 glucose 170 calcium 8.6 albumin 2.5 AST 139 ALT 143 hemoglobin 10.2  Objective:  Vital signs in last 24 hours:  Temp:  [97.6 F (36.4 C)-99 F (37.2 C)] 98.7 F (37.1 C) (03/16 0400) Pulse Rate:  [35-91] 46 (03/16 0600) Resp:  [10-21] 14 (03/16 0600) SpO2:  [90 %-100 %] 96 % (03/16 0600) Arterial Line BP: (67-143)/(16-30) 112/22 (03/16 0600) Weight:  [80 kg-82.1 kg] 80 kg (03/15 2320)  Weight change:  Filed Weights   09/25/20 0500 09/26/20 1900 09/26/20 2320  Weight: 79.9 kg 82.1 kg 80 kg    Intake/Output: I/O last 3 completed shifts: In: 1580.9 [P.O.:700; I.V.:400.9; NG/GT:480] Out: -    Intake/Output this shift:  Total I/O In: 380.7  [I.V.:380.7] Out: 2000 [Other:2000]  Frail elderly CVS-bradycardic no obvious murmurs rubs or gallops RS-clear anteriorly ABD- BS present soft non-distended EXT- no edema right AV fistula   Basic Metabolic Panel: Recent Labs  Lab 09/23/20 1937 09/24/20 0315 09/24/20 1059 09/25/20 0424 09/26/20 0513 09/27/20 0110  NA 137  --  139 133* 133* 136  K 3.3*  --  3.7 4.6 4.5 3.8  CL 93*  --  94* 90* 88* 96*  CO2 34*  --  34* 30 31 28   GLUCOSE 200*  --  157* 194* 178* 170*  BUN 7*  --  12 19 29* 15  CREATININE 2.74* 3.09* 3.62* 4.46* 5.62* 3.48*  CALCIUM 7.7*  --  8.1* 8.5* 8.7* 8.6*  MG 1.7  --  1.8 2.7*  --  2.1  PHOS 2.2*  --   --   --  4.8*  --     Liver Function Tests: Recent Labs  Lab 09/23/20 1937 09/26/20 0513 09/27/20 0110  AST 43*  --  139*  ALT 27  --  143*  ALKPHOS 75  --  66  BILITOT 0.6  --  0.7  PROT 6.2*  --  5.7*  ALBUMIN 3.0* 2.6* 2.5*   No results for input(s): LIPASE, AMYLASE in the last 168 hours. Recent Labs  Lab 09/26/20 0513  AMMONIA 36*    CBC: Recent Labs  Lab 09/23/20 1937 09/24/20 0315 09/25/20 0424 09/26/20 1250  WBC 5.1 6.1 8.1 7.5  NEUTROABS 4.1  --   --   --   HGB 11.0* 11.0* 10.3* 10.2*  HCT 34.5* 34.2* 33.7* 33.2*  MCV 106.2* 105.9* 109.1* 107.8*  PLT 129* 123* 129* 124*    Cardiac Enzymes: No results for input(s): CKTOTAL, CKMB, CKMBINDEX, TROPONINI in the last 168 hours.  BNP: Invalid input(s): POCBNP  CBG: Recent Labs  Lab 09/25/20 2216 09/26/20 0823 09/26/20 1119 09/26/20 1521 09/26/20 2025  GLUCAP 157* 167* 181* 147* 132*    Microbiology: Results for orders placed or performed during the hospital encounter of 09/23/20  Resp Panel by RT-PCR (Flu A&B, Covid) Nasopharyngeal Swab     Status: None   Collection Time: 09/23/20  7:59 PM   Specimen: Nasopharyngeal Swab; Nasopharyngeal(NP) swabs in vial transport medium  Result Value Ref Range Status   SARS Coronavirus 2 by RT PCR NEGATIVE NEGATIVE Final     Comment: (NOTE) SARS-CoV-2 target nucleic acids are NOT DETECTED.  The SARS-CoV-2 RNA is generally detectable in upper respiratory specimens during the acute phase of infection. The lowest concentration of SARS-CoV-2 viral copies this assay can detect is 138 copies/mL. A negative result does not preclude SARS-Cov-2 infection and should not be used as the sole basis for treatment or other patient management decisions. A negative result may occur with  improper specimen collection/handling, submission of specimen other than nasopharyngeal swab, presence of viral mutation(s) within the areas targeted by this assay, and inadequate number of viral copies(<138 copies/mL). A negative result must be combined with clinical observations, patient history, and epidemiological information. The expected result is Negative.  Fact Sheet for Patients:  EntrepreneurPulse.com.au  Fact Sheet for Healthcare Providers:  IncredibleEmployment.be  This test is no t yet approved or cleared by the [place] FDA and  has been authorized for detection and/or diagnosis of SARS-CoV-2 by FDA under an Emergency Use Authorization (EUA). This EUA will remain  in effect (meaning this test can be used) for the duration of the COVID-19 declaration under Section 564(b)(1) of the Act, 21 U.S.C.section 360bbb-3(b)(1), unless the authorization is terminated  or revoked sooner.       Influenza A by PCR NEGATIVE NEGATIVE Final   Influenza B by PCR NEGATIVE NEGATIVE Final    Comment: (NOTE) The Xpert Xpress SARS-CoV-2/FLU/RSV plus assay is intended as an aid in the diagnosis of influenza from Nasopharyngeal swab specimens and should not be used as a sole basis for treatment. Nasal washings and aspirates are unacceptable for Xpert Xpress SARS-CoV-2/FLU/RSV testing.  Fact Sheet for Patients: EntrepreneurPulse.com.au  Fact Sheet for Healthcare  Providers: IncredibleEmployment.be  This test is not yet approved or cleared by the [place] FDA and has been authorized for detection and/or diagnosis of SARS-CoV-2 by FDA under an Emergency Use Authorization (EUA). This EUA will remain in effect (meaning this test can be used) for the duration of the COVID-19 declaration under Section 564(b)(1) of the Act, 21 U.S.C. section 360bbb-3(b)(1), unless the authorization is terminated or revoked.  Performed at [facility], [place] [address], [place], [place] [address]   MRSA PCR Screening     Status: None   Collection Time: 09/24/20 12:08 PM   Specimen: Nasopharyngeal  Result Value Ref Range Status   MRSA by PCR NEGATIVE NEGATIVE Final    Comment:        The GeneXpert MRSA Assay (FDA approved for NASAL specimens only), is one component of a comprehensive MRSA colonization surveillance program. It is not intended to diagnose MRSA infection nor to guide or monitor treatment for MRSA infections. Performed at [facility], [place] [address], [place], [place] [address]  Coagulation Studies: No results for input(s): LABPROT, INR in the last 72 hours.  Urinalysis: No results for input(s): COLORURINE, LABSPEC, PHURINE, GLUCOSEU, HGBUR, BILIRUBINUR, KETONESUR, PROTEINUR, UROBILINOGEN, NITRITE, LEUKOCYTESUR in the last 72 hours.  Invalid input(s): APPERANCEUR    Imaging: No results found.   Medications:   . sodium chloride    . sodium chloride Stopped (09/25/20 1025)  . sodium chloride    . sodium chloride    . sodium chloride    . sodium chloride     . allopurinol  100 mg Oral q AM  . atorvastatin  40 mg Oral QHS  . brimonidine  1 drop Right Eye QHS   And  . brimonidine  2 drop Left Eye QHS  . calcium carbonate  1 tablet Oral Q lunch  . Chlorhexidine Gluconate Cloth  6 each Topical Daily  . Chlorhexidine Gluconate Cloth  6 each Topical Q0600  . cinacalcet  30 mg Oral Once per day on  Tue Thu  . citalopram  20 mg Oral q AM  . clopidogrel  75 mg Oral q AM  . feeding supplement (NEPRO CARB STEADY)  237 mL Oral TID BM  . insulin aspart  0-5 Units Subcutaneous QHS  . insulin aspart  0-6 Units Subcutaneous TID WC  . latanoprost  1 drop Both Eyes QHS  . multivitamin  1 tablet Oral QHS  . polycarbophil  625 mg Oral BID WC  . sevelamer carbonate  1,600 mg Oral BID WC  . sodium chloride flush  3 mL Intravenous Q12H  . sodium chloride flush  3 mL Intravenous Q12H  . sodium chloride flush  3 mL Intravenous Q12H  . timolol  1 drop Left Eye QHS   sodium chloride, sodium chloride, sodium chloride, sodium chloride, acetaminophen, alteplase, famotidine, heparin, lidocaine (PF), lidocaine-prilocaine, morphine injection, nitroGLYCERIN, oxyCODONE-acetaminophen, pentafluoroprop-tetrafluoroeth, sodium chloride flush, sodium chloride flush  Dialysis prescription  Dialysis [facility] kidney center Tuesday Thursday Saturday Time: 4 hrs EDW 76.5 K 3  Ca 2.5  Micera 100 q 2 weeks   Assessment/Plan:  ESRD-Tuesday Thursday Saturday dialysis [facility] dialysis unit.  Next dialysis treatment 09/28/2020.  ANEMIA-receives ESA, hemoglobin stable will follow  MBD-continue binders will follow trend  HTN/VOL-   continue ultrafiltration 1 to 2 L  ACCESS-   right AV fistula   Symptomatic ventricular tachyarrhythmia appreciate cardiology assistant 2D echo 09/24/2020 EF 45%.  Cardiac catheterization 09/24/2020 severe native vessel disease with 100% new ostial and proximal lesion of SVG-RCA.  EP consultation per cardiology.    LOS: [place] @TODAY @6 :33 AM

## 2020-09-27 NOTE — Progress Notes (Signed)
[facility]                                                                              Patient Demographics  Erick Oxendine, is a 80 y.o. female, DOB - 07-29-1940, HQI:[id number]  Admit date - 09/23/2020   Admitting Physician Elwyn Reach, MD  Outpatient Primary MD for the patient is Josetta Huddle, MD  Outpatient specialists:   LOS - 4  days   Medical records reviewed and are as summarized below:    Chief Complaint  Patient presents with   Seizures       Brief summary   80 y.o.femalewith medical history significant ofend-stage renal disease on hemodialysis TTS, coronary artery disease, carotid stenosis, atrial fibrillation, previous history of nonsustained V. tach, diabetes, essential hypertension, hyperlipidemia, hard of hearing who presented with multiple episodes of syncope prior to admit, later found to have vtach by EMS. Pt was admitted for further work up.  Patient was scheduled to undergo left heart cath on 3/14 when she sustained an episode of symptomatic V. Tach (code blue), requiring brief chest compressions with ROSC.  Underwent emergent cath on 3/13 with new 100% ostial and proximal CTO of SVG RCA.  EP was consulted, not a candidate for ICD.  CT angio showed no PE.  Patient continued to have several episodes of nonsustained V. tach or ventricular bigeminy, hypoxia.  Cardiology recommended amiodarone, no beta-blocker due to bradycardia and amiodarone. Palliative medicine following, goals of care discussed, DNR/DNI, for now continue full scope medical treatment.  Nephrology following for hemodialysis.   Assessment & Plan    Principal Problem: Recurrent symptomatic ventricular tachycardia with multiple syncopal episodes ([place]) -Cardiac cath on 3/13 with findings of new 100% ostial and proximal CTO of SVG RCA. -Cardiology following, recommended amiodarone - seen by EP, not a candidate for ICD. -Palliative medicine was consulted, goals of care  discussed, DNR/DNI, full scope of treatment.   -Continue aspirin, Plavix, statin   Paroxysmal atrial fibrillation -Currently bradycardia, no beta-blocker.  Received amiodarone bolus with drip. -Cardiology following, not a candidate for anticoagulation due to previous brain bleed  Diabetes mellitus type 2, IDDM, on long-term insulin, with nephropathy -Hemoglobin A1c 5.0 -CBGs fairly stable, 140 this a.m., continue sliding scale insulin  Essential hypertension BP currently stable  ESRD on hemodialysis, TTS -Nephrology following, underwent dialysis on 3/15  Anemia of chronic disease -Continue ESA, H&H currently stable  Transaminitis -Possibly due to #1, hypotension, no acute complaints/abdominal pain -Currently on Lipitor, repeat CMET in am, if trending up, will hold Lipitor   Obesity Estimated body mass index is 30.05 kg/m as calculated from the following:   Height as of this encounter: 5\' 4"  (1.626 m).   Weight as of this encounter: 79.4 kg.  Code Status: DNR/DNI DVT Prophylaxis:  Heparin subcu  Level of Care: Level of care: ICU Family Communication: Discussed all imaging results, lab results, explained to the patient's daughter, 2 granddaughters at the bedside   Disposition Plan:     Status is: Inpatient  Remains inpatient appropriate because:Inpatient level of care appropriate due to severity of illness   Dispo: The patient is from:  Home              Anticipated d/c is to: TBD              Patient currently is not medically stable to d/c.    Difficult to place patient No  Time Spent in minutes   25 minutes  Procedures:  Left heart catheterization 3/13  Consultants:   Cardiology Palliative medicine Nephrology  Antimicrobials:   Anti-infectives (From admission, onward)   None          Medications  Scheduled Meds:  allopurinol  100 mg Oral q AM   atorvastatin  40 mg Oral QHS   brimonidine  1 drop Right Eye QHS   And   brimonidine  2  drop Left Eye QHS   calcium carbonate  1 tablet Oral Q lunch   Chlorhexidine Gluconate Cloth  6 each Topical Daily   Chlorhexidine Gluconate Cloth  6 each Topical Q0600   Chlorhexidine Gluconate Cloth  6 each Topical Q0600   cinacalcet  30 mg Oral Once per day on Tue Thu   citalopram  20 mg Oral q AM   clopidogrel  75 mg Oral q AM   feeding supplement (NEPRO CARB STEADY)  237 mL Oral TID BM   insulin aspart  0-5 Units Subcutaneous QHS   insulin aspart  0-6 Units Subcutaneous TID WC   latanoprost  1 drop Both Eyes QHS   multivitamin  1 tablet Oral QHS   polycarbophil  625 mg Oral BID WC   sevelamer carbonate  1,600 mg Oral BID WC   sodium chloride flush  3 mL Intravenous Q12H   sodium chloride flush  3 mL Intravenous Q12H   sodium chloride flush  3 mL Intravenous Q12H   timolol  1 drop Left Eye QHS   Continuous Infusions:  sodium chloride     sodium chloride Stopped (09/25/20 1025)   sodium chloride     sodium chloride     sodium chloride     sodium chloride     PRN Meds:.sodium chloride, sodium chloride, sodium chloride, sodium chloride, acetaminophen, alteplase, famotidine, heparin, lidocaine (PF), lidocaine-prilocaine, morphine injection, nitroGLYCERIN, oxyCODONE-acetaminophen, pentafluoroprop-tetrafluoroeth, sodium chloride flush, sodium chloride flush      Subjective:   Early Ord was seen and examined today.  Daughter and granddaughters at the bedside.  Per daughter, overnight had an episode of lethargy, ?  Unresponsiveness, currently alert and awake, no acute complaints by the patient.  No active nausea or vomiting, shortness of breath.  Denies any pain at the time of encounter.    Objective:   Vitals:   09/27/20 0650 09/27/20 0700 09/27/20 0800 09/27/20 0900  BP:      Pulse:  (!) 47 92 84  Resp:  12 12 16   Temp: 98.5 F (36.9 C)     TempSrc: Axillary     SpO2:  96% 100% 100%  Weight: 79.4 kg     Height:        Intake/Output  Summary (Last 24 hours) at 09/27/2020 0947 Last data filed at 09/27/2020 0300 Gross per 24 hour  Intake 480.67 ml  Output 2000 ml  Net -1519.33 ml     Wt Readings from Last 3 Encounters:  09/27/20 79.4 kg  07/03/20 74.8 kg  05/17/20 77.1 kg     Exam  General: Alert and oriented, NAD, hearing deficit  Cardiovascular: S1 S2 auscultated, RRR  Respiratory: Clear to auscultation bilaterally, no wheezing  Gastrointestinal: Soft, nontender,  nondistended, + bowel sounds  Ext: no pedal edema bilaterally  Neuro: no new deficits  Musculoskeletal: No digital cyanosis, clubbing  Skin: No rashes  Psych: alert and awake conversing   Data Reviewed:  I have personally reviewed following labs and imaging studies  Micro Results Recent Results (from the past 240 hour(s))  Resp Panel by RT-PCR (Flu A&B, Covid) Nasopharyngeal Swab     Status: None   Collection Time: 09/23/20  7:59 PM   Specimen: Nasopharyngeal Swab; Nasopharyngeal(NP) swabs in vial transport medium  Result Value Ref Range Status   SARS Coronavirus 2 by RT PCR NEGATIVE NEGATIVE Final    Comment: (NOTE) SARS-CoV-2 target nucleic acids are NOT DETECTED.  The SARS-CoV-2 RNA is generally detectable in upper respiratory specimens during the acute phase of infection. The lowest concentration of SARS-CoV-2 viral copies this assay can detect is 138 copies/mL. A negative result does not preclude SARS-Cov-2 infection and should not be used as the sole basis for treatment or other patient management decisions. A negative result may occur with  improper specimen collection/handling, submission of specimen other than nasopharyngeal swab, presence of viral mutation(s) within the areas targeted by this assay, and inadequate number of viral copies(<138 copies/mL). A negative result must be combined with clinical observations, patient history, and epidemiological information. The expected result is Negative.  Fact Sheet for  Patients:  EntrepreneurPulse.com.au  Fact Sheet for Healthcare Providers:  IncredibleEmployment.be  This test is no t yet approved or cleared by the [place] FDA and  has been authorized for detection and/or diagnosis of SARS-CoV-2 by FDA under an Emergency Use Authorization (EUA). This EUA will remain  in effect (meaning this test can be used) for the duration of the COVID-19 declaration under Section 564(b)(1) of the Act, 21 U.S.C.section 360bbb-3(b)(1), unless the authorization is terminated  or revoked sooner.       Influenza A by PCR NEGATIVE NEGATIVE Final   Influenza B by PCR NEGATIVE NEGATIVE Final    Comment: (NOTE) The Xpert Xpress SARS-CoV-2/FLU/RSV plus assay is intended as an aid in the diagnosis of influenza from Nasopharyngeal swab specimens and should not be used as a sole basis for treatment. Nasal washings and aspirates are unacceptable for Xpert Xpress SARS-CoV-2/FLU/RSV testing.  Fact Sheet for Patients: EntrepreneurPulse.com.au  Fact Sheet for Healthcare Providers: IncredibleEmployment.be  This test is not yet approved or cleared by the [place] FDA and has been authorized for detection and/or diagnosis of SARS-CoV-2 by FDA under an Emergency Use Authorization (EUA). This EUA will remain in effect (meaning this test can be used) for the duration of the COVID-19 declaration under Section 564(b)(1) of the Act, 21 U.S.C. section 360bbb-3(b)(1), unless the authorization is terminated or revoked.  Performed at [facility], [place] [address] [place], [place] [address]   MRSA PCR Screening     Status: None   Collection Time: 09/24/20 12:08 PM   Specimen: Nasopharyngeal  Result Value Ref Range Status   MRSA by PCR NEGATIVE NEGATIVE Final    Comment:        The GeneXpert MRSA Assay (FDA approved for NASAL specimens only), is one component of a comprehensive MRSA  colonization surveillance program. It is not intended to diagnose MRSA infection nor to guide or monitor treatment for MRSA infections. Performed at [facility], [place] [address], [place], [place] [address]     Radiology Reports CT Head Wo Contrast  Result Date: 09/23/2020 CLINICAL DATA:  Encephalopathy EXAM: CT HEAD WITHOUT  CONTRAST TECHNIQUE: Contiguous axial images were obtained from the base of the skull through the vertex without intravenous contrast. COMPARISON:  None. FINDINGS: Brain: There is no mass, hemorrhage or extra-axial collection. The size and configuration of the ventricles and extra-axial CSF spaces are normal. There is hypoattenuation of the white matter, most commonly indicating chronic small vessel disease. Vascular: Atherosclerotic calcification of the internal carotid arteries at the skull base. No abnormal hyperdensity of the major intracranial arteries or dural venous sinuses. Skull: The visualized skull base, calvarium and extracranial soft tissues are normal. Sinuses/Orbits: Right maxillary sinus mucosal thickening. The orbits are normal. IMPRESSION: Chronic small vessel disease without acute intracranial abnormality. Electronically Signed   By: Ulyses Jarred M.D.   On: 09/23/2020 20:47   CT ANGIO CHEST PE W OR WO CONTRAST  Result Date: 09/24/2020 CLINICAL DATA:  History of syncope and ventricular tachycardia EXAM: CT ANGIOGRAPHY CHEST WITH CONTRAST TECHNIQUE: Multidetector CT imaging of the chest was performed using the standard protocol during bolus administration of intravenous contrast. Multiplanar CT image reconstructions and MIPs were obtained to evaluate the vascular anatomy. CONTRAST:  72mL OMNIPAQUE IOHEXOL 350 MG/ML SOLN COMPARISON:  09/23/2020 FINDINGS: Cardiovascular: This is a technically adequate opacification of the pulmonary vasculature. No filling defects or pulmonary emboli. The heart is enlarged without pericardial effusion. Normal caliber of the  thoracic aorta. Extensive atherosclerosis of the aorta and coronary vasculature. Postsurgical changes from previous CABG. Mediastinum/Nodes: No enlarged mediastinal, hilar, or axillary lymph nodes. Thyroid gland, trachea, and esophagus demonstrate no significant findings. Lungs/Pleura: There are moderate bilateral pleural effusions volume estimated less than 1 L each. Dense bilateral dependent lower lobe consolidation consistent with atelectasis. No pneumothorax. Central airways are patent. Upper Abdomen: No acute abnormality. Musculoskeletal: There are minimally displaced acute right anterolateral fourth through seventh rib fractures, likely related to recent CPR. Numerous other prior bilateral healed rib fractures are also noted. No other acute bony abnormalities. Reconstructed images demonstrate no additional findings. Review of the MIP images confirms the above findings. IMPRESSION: 1. No evidence of pulmonary embolus. 2. Moderate bilateral pleural effusions with dense bilateral lower lobe atelectasis. 3. Right anterolateral fourth through seventh acute rib fractures consistent with recent CPR. 4.  Aortic Atherosclerosis (ICD10-I70.0). Electronically Signed   By: Randa Ngo M.D.   On: 09/24/2020 21:17   MR BRAIN WO CONTRAST  Result Date: 09/24/2020 CLINICAL DATA:  48-year-old female with recurrent syncope and confusion. EXAM: MRI HEAD WITHOUT CONTRAST TECHNIQUE: Multiplanar, multiecho pulse sequences of the brain and surrounding structures were obtained without intravenous contrast. COMPARISON:  Head CT 09/23/2020.  Brain MRI 07/13/2015. FINDINGS: Brain: No restricted diffusion to suggest acute infarction. No midline shift, mass effect, evidence of mass lesion, ventriculomegaly, extra-axial collection or acute intracranial hemorrhage. Cervicomedullary junction and pituitary are within normal limits. Mild generalized cerebral volume loss since 2016. But gray and white matter signal remains largely normal  for age. No cortical encephalomalacia. But there are occasional chronic micro hemorrhages in the brain, including the posterior right frontal lobe (series 14, image 37) and left cerebellum (image 13). Furthermore, there is intrinsic T1 hyperintensity in the bilateral globus pallidus since 2016 (series 16, image 30 on the left. Vascular: Major intracranial vascular flow voids are stable since 2016. 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 Mild associated visible cervical spinal stenosis, including at C2-C3. Heterogeneous bone marrow signal but within normal limits. Sinuses/Orbits: Negative orbits. Mild to moderate right maxillary sinus mucosal thickening  is new, but other paranasal sinuses are improved compared to 2016. Other: Trace right mastoid fluid.  Negative visible nasopharynx. IMPRESSION: 1. Intrinsic T1 hyperintensity has developed in the bilateral globus pallidus since a 2016 MRI. This is most frequently associated with Hepatic Insufficiency / Cirrhosis. Correlation with serum Ammonia levels and liver function tests is recommended. 2. No other acute intracranial abnormality. Mild generalized cerebral volume loss since 2016 with occasional chronic micro hemorrhages in the brain. 3. 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 Electronically Signed   By: Genevie Ann M.D.   On: 09/24/2020 11:34   CARDIAC CATHETERIZATION  Result Date: 09/24/2020  -----------NATIVE VESSELS-------------------------  Ost LAD to Prox LAD lesion is 95% stenosed. Prox LAD lesion is 100% stenosed with 100% stenosed side branch in Ost 1st Diag.  Ost 1st Mrg lesion is 75% stenosed.  Prox Cx to Mid Cx lesion is 100% stenosed (just after 1st Mrg) with side branch in Ost 2nd Mrg.  Prox RCA lesion is 95% stenosed. Mid RCA to Dist RCA lesion is 100% stenosed.   ------------GRAFTS--------------------------  SVG-RCA graft was visualized by angiography and is small. Origin to Mid Graft lesion is 100% stenosed. - Previously stented Graft  SVG-1stDiag graft was visualized by angiography. Dist Graft to Insertion lesion is 55% stenosed.  LIMA-dLAD graft was visualized by angiography and is normal in caliber. The graft exhibits no disease. There is no competitive flow  ---------------------------  There is mild left ventricular systolic dysfunction. The left ventricular ejection fraction is 45-50% by visual estimate.  LV end diastolic pressure is mildly elevated.  There is a significant difference in arterial pressures versus cuff pressures-arterial pressures are at least 30- 40 mmHg higher.  SUMMARY  Severe native vessel CAD:  100% LAD following a 95% calcified lesion prior to SP1;  100% mid RCA following severe 80% stenosis,  100% AV groove LCx after OM1 with bridging collaterals to small PL branch,  75 to 80% calcified ostial OM1 (large bifurcating ramus-like vessel)  Grafts:  New lesion: 100% ostial and proximal CTO of SVG-RCA (was a very small caliber vessel previously stented.  The nature of this occlusion is such that it does not appear to be acute-the patient not actively having chest pain and no ST elevations, minimal troponin. => Likely not a reasonable target for PCI  SVG- 1st Diag has an anastomotic 50 to 60% stenosis of questionable significance (at this stage, the graft is the same caliber as a target vessel) ->  I chose not to attempt intervention on this location as she is hemodynamically clinically stable at this point. Would not want to complicate issues by potentially occluding another graft.  Patent LIMA-LAD with no left-to-right collaterals  Mildly reduced EF with inferobasal hypokinesis-45 to 50%.  Borderline elevated LVEDP of 15 mmHg  Significant discrepancy between aortic blood pressures and cuff blood pressures of at least 30 mmHg.   Cuff pressures are significantly lower. RECOMMENDATIONS  Return to CCU, would continue amiodarone load, consider lidocaine if VT recurs.  EP consultation to consider the possibility of ICD.  For future stabilization, could consider the possibility of intervention SVG-1st Diag (albeit a questionable significance), also atherectomy PCI of the OM1 (this is similar in appearance to previous cath in 2018, so this would only be to optimize distal flow - would likely need ischemic evaluation prior to proceeding with PCI on either lesion).  Prior to initiating pressor support in the ICU, would consider invasive arterial monitoring  DG Chest Port 1 View  Result Date: 09/23/2020 CLINICAL DATA:  Ventricular tachycardia, cardiac arrest status post resuscitation EXAM: PORTABLE CHEST 1 VIEW COMPARISON:  04/10/2018 FINDINGS: Single frontal view of the chest demonstrates stable enlargement the cardiac silhouette, with postsurgical changes from median sternotomy and bypass surgery. Retrocardiac consolidation may reflect atelectasis, infection, or aspiration. Small left pleural effusion. No pneumothorax. No acute displaced fractures. Vascular stent left axillary region. IMPRESSION: 1. Left lower lobe consolidation may reflect atelectasis, infection, or aspiration. 2. Small left pleural effusion. Electronically Signed   By: Randa Ngo M.D.   On: 09/23/2020 19:21   ECHOCARDIOGRAM COMPLETE  Result Date: 09/24/2020    ECHOCARDIOGRAM REPORT   Patient Name:   Kiele MAE Nowack Date of Exam: 09/24/2020 Medical Rec #:  [id number]         Height:       64.0 in Accession #:    [id number]        Weight:       170.0 lb Date of Birth:  03/28/1941          BSA:          1.826 m Patient Age:    33 years          BP:           84/65 mmHg Patient Gender: F                 HR:           52 bpm. Exam Location:  Inpatient Procedure: 2D Echo Indications:    cardiac arrest  History:        Patient has prior history of Echocardiogram  examinations, most                 recent 05/31/2020. CAD, Prior CABG, end stage renal disease,                 Arrythmias:ventricular tachycardia; Signs/Symptoms:Syncope.  Sonographer:    Johny Chess Referring Phys: [place]  1. Patient had VT/VF arrest within minutes of starting echocardiogram. Remainder of study completed post cath, post arrest.  2. Left ventricular ejection fraction, by estimation, is 45%. The left ventricle has mildly decreased function. The left ventricle demonstrates regional wall motion abnormalities (see scoring diagram/findings for description). Left ventricular diastolic  parameters are consistent with Grade II diastolic dysfunction (pseudonormalization).  3. Right ventricular systolic function is severely reduced. The right ventricular size is moderately enlarged. There is normal pulmonary artery systolic pressure. The estimated right ventricular systolic pressure is 66.5 mmHg.  4. Left atrial size was moderately dilated.  5. Right atrial size was moderately dilated.  6. The mitral valve is degenerative. Mild mitral valve regurgitation. No evidence of mitral stenosis.  7. The aortic valve is grossly normal. There is mild thickening of the aortic valve. Aortic valve regurgitation is not visualized. No aortic stenosis is present.  8. The inferior vena cava is normal in size with <50% respiratory variability, suggesting right atrial pressure of 8 mmHg. FINDINGS  Left Ventricle: Left ventricular ejection fraction, by estimation, is 45%. The left ventricle has mildly decreased function. The left ventricle demonstrates regional wall motion abnormalities. The left ventricular internal cavity size was normal in size. There is no left ventricular hypertrophy. Left ventricular diastolic parameters are consistent with Grade II diastolic dysfunction (pseudonormalization).  LV Wall Scoring: The basal inferolateral segment and basal inferior segment are akinetic. Right  Ventricle: The right ventricular size is  moderately enlarged. Right vetricular wall thickness was not well visualized. Right ventricular systolic function is severely reduced. There is normal pulmonary artery systolic pressure. The tricuspid regurgitant velocity is 2.27 m/s, and with an assumed right atrial pressure of 8 mmHg, the estimated right ventricular systolic pressure is 29.4 mmHg. Left Atrium: Left atrial size was moderately dilated. Right Atrium: Right atrial size was moderately dilated. Pericardium: There is no evidence of pericardial effusion. Mitral Valve: The mitral valve is degenerative in appearance. Mild mitral annular calcification. Mild mitral valve regurgitation. No evidence of mitral valve stenosis. Tricuspid Valve: The tricuspid valve is normal in structure. Tricuspid valve regurgitation is mild. Aortic Valve: The aortic valve is grossly normal. There is mild thickening of the aortic valve. Aortic valve regurgitation is not visualized. No aortic stenosis is present. Pulmonic Valve: The pulmonic valve was not well visualized. Pulmonic valve regurgitation is trivial. Aorta: The aortic root is normal in size and structure. Venous: The inferior vena cava is normal in size with less than 50% respiratory variability, suggesting right atrial pressure of 8 mmHg. IAS/Shunts: No atrial level shunt detected by color flow Doppler.  LEFT VENTRICLE PLAX 2D LVIDd:         4.40 cm Diastology LVIDs:         3.60 cm LV e' medial:    5.77 cm/s LV PW:         0.90 cm LV E/e' medial:  24.4 LV IVS:        0.70 cm LV e' lateral:   7.51 cm/s                        LV E/e' lateral: 18.8  RIGHT VENTRICLE            IVC RV S prime:     4.35 cm/s  IVC diam: 1.80 cm LEFT ATRIUM             Index       RIGHT ATRIUM           Index LA diam:        3.60 cm 1.97 cm/m  RA Area:     16.20 cm LA Vol (A2C):   71.5 ml 39.16 ml/m RA Volume:   41.10 ml  22.51 ml/m LA Vol (A4C):   34.5 ml 18.90 ml/m LA Biplane Vol: 51.6 ml 28.26  ml/m  AORTIC VALVE LVOT Vmax:   84.90 cm/s LVOT Vmean:  57.800 cm/s LVOT VTI:    0.221 m  AORTA Ao Asc diam: 2.30 cm MITRAL VALVE                TRICUSPID VALVE MV Area (PHT): 2.87 cm     TR Peak grad:   20.6 mmHg MV Decel Time: 264 msec     TR Vmax:        227.00 cm/s MV E velocity: 141.00 cm/s MV A velocity: 80.80 cm/s   SHUNTS MV E/A ratio:  1.75         Systemic VTI: 0.22 m Cherlynn Kaiser MD Electronically signed by Cherlynn Kaiser MD Signature Date/Time: 09/24/2020/4:44:35 PM    Final     Lab Data:  CBC: Recent Labs  Lab 09/23/20 1937 09/24/20 0315 09/25/20 0424 09/26/20 1250  WBC 5.1 6.1 8.1 7.5  NEUTROABS 4.1  --   --   --   HGB 11.0* 11.0* 10.3* 10.2*  HCT 34.5* 34.2* 33.7* 33.2*  MCV 106.2* 105.9* 109.1* 107.8*  PLT  129* 123* 129* 419*   Basic Metabolic Panel: Recent Labs  Lab 09/23/20 1937 09/24/20 0315 09/24/20 1059 09/25/20 0424 09/26/20 0513 09/27/20 0110  NA 137  --  139 133* 133* 136  K 3.3*  --  3.7 4.6 4.5 3.8  CL 93*  --  94* 90* 88* 96*  CO2 34*  --  34* 30 31 28   GLUCOSE 200*  --  157* 194* 178* 170*  BUN 7*  --  12 19 29* 15  CREATININE 2.74* 3.09* 3.62* 4.46* 5.62* 3.48*  CALCIUM 7.7*  --  8.1* 8.5* 8.7* 8.6*  MG 1.7  --  1.8 2.7*  --  2.1  PHOS 2.2*  --   --   --  4.8*  --    GFR: Estimated Creatinine Clearance: 13.1 mL/min (A) (by C-G formula based on SCr of 3.48 mg/dL (H)). Liver Function Tests: Recent Labs  Lab 09/23/20 1937 09/26/20 0513 09/27/20 0110  AST 43*  --  139*  ALT 27  --  143*  ALKPHOS 75  --  66  BILITOT 0.6  --  0.7  PROT 6.2*  --  5.7*  ALBUMIN 3.0* 2.6* 2.5*   No results for input(s): LIPASE, AMYLASE in the last 168 hours. Recent Labs  Lab 09/26/20 0513  AMMONIA 36*   Coagulation Profile: Recent Labs  Lab 09/23/20 1937  INR 1.1   Cardiac Enzymes: No results for input(s): CKTOTAL, CKMB, CKMBINDEX, TROPONINI in the last 168 hours. BNP (last 3 results) No results for input(s): PROBNP in the last 8760  hours. HbA1C: No results for input(s): HGBA1C in the last 72 hours. CBG: Recent Labs  Lab 09/26/20 0823 09/26/20 1119 09/26/20 1521 09/26/20 2025 09/27/20 0653  GLUCAP 167* 181* 147* 132* 140*   Lipid Profile: No results for input(s): CHOL, HDL, LDLCALC, TRIG, CHOLHDL, LDLDIRECT in the last 72 hours. Thyroid Function Tests: Recent Labs    09/24/20 1059  TSH 2.440   Anemia Panel: No results for input(s): VITAMINB12, FOLATE, FERRITIN, TIBC, IRON, RETICCTPCT in the last 72 hours. Urine analysis:    Component Value Date/Time   COLORURINE YELLOW 12/21/2015 2222   APPEARANCEUR CLEAR 12/21/2015 2222   LABSPEC 1.013 12/21/2015 2222   PHURINE 6.5 12/21/2015 2222   GLUCOSEU NEGATIVE 12/21/2015 2222   HGBUR MODERATE (A) 12/21/2015 2222   BILIRUBINUR NEGATIVE 12/21/2015 2222   KETONESUR NEGATIVE 12/21/2015 2222   PROTEINUR >300 (A) 12/21/2015 2222   UROBILINOGEN 0.2 04/19/2015 1248   NITRITE POSITIVE (A) 12/21/2015 2222   LEUKOCYTESUR TRACE (A) 12/21/2015 2222     Duc Crocket M.D. [facility] 09/27/2020, 9:47 AM  Available via Epic secure chat 7am-7pm After 7 pm, please refer to night coverage provider listed on amion.

## 2020-09-28 DIAGNOSIS — I2510 Atherosclerotic heart disease of native coronary artery without angina pectoris: Secondary | ICD-10-CM | POA: Diagnosis not present

## 2020-09-28 DIAGNOSIS — R52 Pain, unspecified: Secondary | ICD-10-CM

## 2020-09-28 DIAGNOSIS — I472 Ventricular tachycardia: Secondary | ICD-10-CM | POA: Diagnosis not present

## 2020-09-28 DIAGNOSIS — Z66 Do not resuscitate: Secondary | ICD-10-CM

## 2020-09-28 DIAGNOSIS — Z789 Other specified health status: Secondary | ICD-10-CM

## 2020-09-28 DIAGNOSIS — L8990 Pressure ulcer of unspecified site, unspecified stage: Secondary | ICD-10-CM | POA: Insufficient documentation

## 2020-09-28 DIAGNOSIS — R55 Syncope and collapse: Secondary | ICD-10-CM | POA: Diagnosis not present

## 2020-09-28 DIAGNOSIS — I248 Other forms of acute ischemic heart disease: Secondary | ICD-10-CM | POA: Diagnosis not present

## 2020-09-28 DIAGNOSIS — I25710 Atherosclerosis of autologous vein coronary artery bypass graft(s) with unstable angina pectoris: Secondary | ICD-10-CM | POA: Diagnosis not present

## 2020-09-28 DIAGNOSIS — D638 Anemia in other chronic diseases classified elsewhere: Secondary | ICD-10-CM | POA: Diagnosis not present

## 2020-09-28 DIAGNOSIS — I25711 Atherosclerosis of autologous vein coronary artery bypass graft(s) with angina pectoris with documented spasm: Secondary | ICD-10-CM | POA: Diagnosis not present

## 2020-09-28 LAB — COMPREHENSIVE METABOLIC PANEL
ALT: 143 U/L — ABNORMAL HIGH (ref 0–44)
ALT: 107 U/L — ABNORMAL HIGH (ref 0–44)
AST: 139 U/L — ABNORMAL HIGH (ref 15–41)
AST: 53 U/L — ABNORMAL HIGH (ref 15–41)
Albumin: 2.5 g/dL — ABNORMAL LOW (ref 3.5–5.0)
Albumin: 2.5 g/dL — ABNORMAL LOW (ref 3.5–5.0)
Alkaline Phosphatase: 66 U/L (ref 38–126)
Alkaline Phosphatase: 70 U/L (ref 38–126)
Anion gap: 12 (ref 5–15)
Anion gap: 12 (ref 5–15)
BUN: 15 mg/dL (ref 8–23)
BUN: 33 mg/dL — ABNORMAL HIGH (ref 8–23)
CO2: 28 mmol/L (ref 22–32)
CO2: 29 mmol/L (ref 22–32)
Calcium: 8.6 mg/dL — ABNORMAL LOW (ref 8.9–10.3)
Calcium: 8.4 mg/dL — ABNORMAL LOW (ref 8.9–10.3)
Chloride: 96 mmol/L — ABNORMAL LOW (ref 98–111)
Chloride: 94 mmol/L — ABNORMAL LOW (ref 98–111)
Creatinine, Ser: 3.48 mg/dL — ABNORMAL HIGH (ref 0.44–1.00)
Creatinine, Ser: 5.34 mg/dL — ABNORMAL HIGH (ref 0.44–1.00)
GFR, Estimated: 13 mL/min — ABNORMAL LOW (ref >60)
GFR, Estimated: 8 mL/min — ABNORMAL LOW (ref >60)
Glucose, Bld: 170 mg/dL — ABNORMAL HIGH (ref 70–99)
Glucose, Bld: 166 mg/dL — ABNORMAL HIGH (ref 70–99)
Potassium: 3.8 mmol/L (ref 3.5–5.1)
Potassium: 3.8 mmol/L (ref 3.5–5.1)
Sodium: 136 mmol/L (ref 135–145)
Sodium: 135 mmol/L (ref 135–145)
Total Bilirubin: 0.7 mg/dL (ref 0.3–1.2)
Total Bilirubin: 0.7 mg/dL (ref 0.3–1.2)
Total Protein: 5.7 g/dL — ABNORMAL LOW (ref 6.5–8.1)
Total Protein: 5.4 g/dL — ABNORMAL LOW (ref 6.5–8.1)

## 2020-09-28 LAB — GLUCOSE, CAPILLARY
Glucose-Capillary: 145 mg/dL — ABNORMAL HIGH (ref 70–99)
Glucose-Capillary: 159 mg/dL — ABNORMAL HIGH (ref 70–99)
Glucose-Capillary: 152 mg/dL — ABNORMAL HIGH (ref 70–99)
Glucose-Capillary: 123 mg/dL — ABNORMAL HIGH (ref 70–99)
Glucose-Capillary: 253 mg/dL — ABNORMAL HIGH (ref 70–99)

## 2020-09-28 MED ORDER — MIDODRINE HCL 5 MG PO TABS
5.0000 mg | ORAL_TABLET | Freq: Three times a day (TID) | ORAL | Status: DC
  Administered 2020-09-28 – 2020-09-30 (×5): 5 mg via ORAL
  Filled 2020-09-28 (×5): qty 1

## 2020-09-28 MED ORDER — MIDODRINE HCL 5 MG PO TABS: 5.0000 mg | ORAL_TABLET | Freq: Three times a day (TID) | ORAL | Status: DC

## 2020-09-28 MED ORDER — AMIODARONE HCL 200 MG PO TABS
200.0000 mg | ORAL_TABLET | Freq: Two times a day (BID) | ORAL | Status: DC
  Administered 2020-09-28 – 2020-10-09 (×20): 200 mg via ORAL
  Filled 2020-09-28 (×22): qty 1

## 2020-09-28 MED ORDER — POLYVINYL ALCOHOL 1.4 % OP SOLN
1.0000 [drp] | OPHTHALMIC | Status: DC | PRN
  Administered 2020-09-28 – 2020-10-11 (×2): 1 [drp] via OPHTHALMIC
  Filled 2020-09-28: qty 15

## 2020-09-28 MED ORDER — SODIUM CHLORIDE 0.9 % IV BOLUS
250.0000 mL | Freq: Once | INTRAVENOUS | Status: AC
  Administered 2020-09-28: 250 mL via INTRAVENOUS

## 2020-09-28 NOTE — Progress Notes (Signed)
[facility] ROUNDING NOTE   Subjective:   Brief history: This is an 80 year old lady with a history of end-stage renal disease Tuesday Thursday Saturday dialysis.  She also has carotid stenosis atrial fibrillation previous history of sustained V. tach diabetes mellitus essential hypertension hyperlipidemia.  She is unfortunately had multiple syncopal episodes secondary to recurrent ventricular tachyarrhythmia.  She was scheduled to undergo left heart catheterization 09/25/2020 and sustained an episode of symptomatic V. tach prompting CODE BLUE.  She required brief chest compressions with ROSC.  She underwent catheterization 09/24/2020 with new 100% ostial and proximal CTO of SVG-RCA.  Recommendations are for EP and possible ICD.  She dialyzes at [facility].  CT angio no evidence of pulmonary embolus.  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  Appreciate assistance from palliative medicine.  She underwent successful dialysis 09/26/2020 with removal of 2 L next dialysis will be 09/28/2020  Blood pressure 139/25 pulse 63 temperature 98.5 O2 sats 100% 5 L nasal cannula    Sodium 135 potassium 3.8 chloride 94 CO2 29 BUN 33 creatinine 5.34 glucose 166 calcium 8.4 albumin 2.5 AST 2053 ALT 107 hemoglobin 10.2  Objective:  Vital signs in last 24 hours:  Temp:  [98.2 F (36.8 C)-98.5 F (36.9 C)] 98.5 F (36.9 C) (03/17 0400) Pulse Rate:  [45-92] 67 (03/17 0600) Resp:  [12-20] 18 (03/17 0600) SpO2:  [93 %-100 %] 97 % (03/17 0600) Arterial Line BP: (90-168)/(17-42) 168/42 (03/17 0600) Weight:  [79.2 kg-79.4 kg] 79.2 kg (03/17 0600)  Weight change: -2.9 kg Filed Weights   09/26/20 2320 09/27/20 0650 09/28/20 0600  Weight: 80 kg 79.4 kg 79.2 kg    Intake/Output: I/O last 3 completed shifts: In: 880.7 [P.O.:500; I.V.:380.7] Out: 2000 [Other:2000]   Intake/Output this shift:  No intake/output data  recorded.  Frail elderly CVS-bradycardic no obvious murmurs rubs or gallops RS-clear anteriorly ABD- BS present soft non-distended EXT- no edema right AV fistula   Basic Metabolic Panel: Recent Labs  Lab 09/23/20 1937 09/24/20 0315 09/24/20 1059 09/25/20 0424 09/26/20 0513 09/27/20 0110 09/28/20 0358  NA 137  --  139 133* 133* 136 135  K 3.3*  --  3.7 4.6 4.5 3.8 3.8  CL 93*  --  94* 90* 88* 96* 94*  CO2 34*  --  34* 30 31 28 29   GLUCOSE 200*  --  157* 194* 178* 170* 166*  BUN 7*  --  12 19 29* 15 33*  CREATININE 2.74*   < > 3.62* 4.46* 5.62* 3.48* 5.34*  CALCIUM 7.7*  --  8.1* 8.5* 8.7* 8.6* 8.4*  MG 1.7  --  1.8 2.7*  --  2.1  --   PHOS 2.2*  --   --   --  4.8*  --   --    < > = values in this interval not displayed.    Liver Function Tests: Recent Labs  Lab 09/23/20 1937 09/26/20 0513 09/27/20 0110 09/28/20 0358  AST 43*  --  139* 53*  ALT 27  --  143* 107*  ALKPHOS 75  --  66 70  BILITOT 0.6  --  0.7 0.7  PROT 6.2*  --  5.7* 5.4*  ALBUMIN 3.0* 2.6* 2.5* 2.5*   No results for input(s): LIPASE, AMYLASE in the last 168 hours. Recent Labs  Lab 09/26/20 0513  AMMONIA 36*    CBC: Recent Labs  Lab 09/23/20 1937 09/24/20 0315 09/25/20 0424  09/26/20 1250  WBC 5.1 6.1 8.1 7.5  NEUTROABS 4.1  --   --   --   HGB 11.0* 11.0* 10.3* 10.2*  HCT 34.5* 34.2* 33.7* 33.2*  MCV 106.2* 105.9* 109.1* 107.8*  PLT 129* 123* 129* 124*    Cardiac Enzymes: No results for input(s): CKTOTAL, CKMB, CKMBINDEX, TROPONINI in the last 168 hours.  BNP: Invalid input(s): POCBNP  CBG: Recent Labs  Lab 09/26/20 2025 09/27/20 0653 09/27/20 1140 09/27/20 1614 09/27/20 2121  GLUCAP 132* 140* 170* 177* 201*    Microbiology: Results for orders placed or performed during the hospital encounter of 09/23/20  Resp Panel by RT-PCR (Flu A&B, Covid) Nasopharyngeal Swab     Status: None   Collection Time: 09/23/20  7:59 PM   Specimen: Nasopharyngeal Swab; Nasopharyngeal(NP)  swabs in vial transport medium  Result Value Ref Range Status   SARS Coronavirus 2 by RT PCR NEGATIVE NEGATIVE Final    Comment: (NOTE) SARS-CoV-2 target nucleic acids are NOT DETECTED.  The SARS-CoV-2 RNA is generally detectable in upper respiratory specimens during the acute phase of infection. The lowest concentration of SARS-CoV-2 viral copies this assay can detect is 138 copies/mL. A negative result does not preclude SARS-Cov-2 infection and should not be used as the sole basis for treatment or other patient management decisions. A negative result may occur with  improper specimen collection/handling, submission of specimen other than nasopharyngeal swab, presence of viral mutation(s) within the areas targeted by this assay, and inadequate number of viral copies(<138 copies/mL). A negative result must be combined with clinical observations, patient history, and epidemiological information. The expected result is Negative.  Fact Sheet for Patients:  EntrepreneurPulse.com.au  Fact Sheet for Healthcare Providers:  IncredibleEmployment.be  This test is no t yet approved or cleared by the [place] FDA and  has been authorized for detection and/or diagnosis of SARS-CoV-2 by FDA under an Emergency Use Authorization (EUA). This EUA will remain  in effect (meaning this test can be used) for the duration of the COVID-19 declaration under Section 564(b)(1) of the Act, 21 U.S.C.section 360bbb-3(b)(1), unless the authorization is terminated  or revoked sooner.       Influenza A by PCR NEGATIVE NEGATIVE Final   Influenza B by PCR NEGATIVE NEGATIVE Final    Comment: (NOTE) The Xpert Xpress SARS-CoV-2/FLU/RSV plus assay is intended as an aid in the diagnosis of influenza from Nasopharyngeal swab specimens and should not be used as a sole basis for treatment. Nasal washings and aspirates are unacceptable for Xpert Xpress  SARS-CoV-2/FLU/RSV testing.  Fact Sheet for Patients: EntrepreneurPulse.com.au  Fact Sheet for Healthcare Providers: IncredibleEmployment.be  This test is not yet approved or cleared by the [place] FDA and has been authorized for detection and/or diagnosis of SARS-CoV-2 by FDA under an Emergency Use Authorization (EUA). This EUA will remain in effect (meaning this test can be used) for the duration of the COVID-19 declaration under Section 564(b)(1) of the Act, 21 U.S.C. section 360bbb-3(b)(1), unless the authorization is terminated or revoked.  Performed at [facility], [place] [address], [place], [place] [address]   MRSA PCR Screening     Status: None   Collection Time: 09/24/20 12:08 PM   Specimen: Nasopharyngeal  Result Value Ref Range Status   MRSA by PCR NEGATIVE NEGATIVE Final    Comment:        The GeneXpert MRSA Assay (FDA approved for NASAL specimens only), is one component of a comprehensive MRSA colonization surveillance program. It  is not intended to diagnose MRSA infection nor to guide or monitor treatment for MRSA infections. Performed at [facility], [place] [address] [place], [place] [address]     Coagulation Studies: No results for input(s): LABPROT, INR in the last 72 hours.  Urinalysis: No results for input(s): COLORURINE, LABSPEC, PHURINE, GLUCOSEU, HGBUR, BILIRUBINUR, KETONESUR, PROTEINUR, UROBILINOGEN, NITRITE, LEUKOCYTESUR in the last 72 hours.  Invalid input(s): APPERANCEUR    Imaging: No results found.   Medications:   . sodium chloride    . sodium chloride Stopped (09/25/20 1025)  . sodium chloride    . sodium chloride    . sodium chloride    . sodium chloride     . allopurinol  100 mg Oral q AM  . brimonidine  1 drop Right Eye QHS   And  . brimonidine  2 drop Left Eye QHS  . calcium carbonate  1 tablet Oral Q lunch  . Chlorhexidine Gluconate Cloth  6 each Topical Daily  .  Chlorhexidine Gluconate Cloth  6 each Topical Q0600  . Chlorhexidine Gluconate Cloth  6 each Topical Q0600  . cinacalcet  30 mg Oral Once per day on Tue Thu  . clopidogrel  75 mg Oral q AM  . feeding supplement (NEPRO CARB STEADY)  237 mL Oral TID BM  . insulin aspart  0-5 Units Subcutaneous QHS  . insulin aspart  0-6 Units Subcutaneous TID WC  . latanoprost  1 drop Both Eyes QHS  . multivitamin  1 tablet Oral QHS  . polycarbophil  625 mg Oral BID WC  . sevelamer carbonate  1,600 mg Oral BID WC  . sodium chloride flush  3 mL Intravenous Q12H  . sodium chloride flush  3 mL Intravenous Q12H  . sodium chloride flush  3 mL Intravenous Q12H  . timolol  1 drop Left Eye QHS   sodium chloride, sodium chloride, sodium chloride, sodium chloride, acetaminophen, alteplase, famotidine, heparin, lidocaine (PF), lidocaine-prilocaine, morphine injection, nitroGLYCERIN, oxyCODONE-acetaminophen, pentafluoroprop-tetrafluoroeth, sodium chloride flush, sodium chloride flush  Dialysis prescription  Dialysis [facility] kidney center Tuesday Thursday Saturday Time: 4 hrs EDW 76.5 K 3  Ca 2.5  Micera 100 q 2 weeks   Assessment/Plan:  ESRD-Tuesday Thursday Saturday dialysis [facility] dialysis unit.  Next dialysis treatment 09/28/2020.  ANEMIA-receives ESA, hemoglobin stable will follow  MBD-continue binders will follow trend  HTN/VOL-   continue ultrafiltration 1 to 2 L  ACCESS-   right AV fistula   Symptomatic ventricular tachyarrhythmia appreciate cardiology assistant 2D echo 09/24/2020 EF 45%.  Cardiac catheterization 09/24/2020 severe native vessel disease with 100% new ostial and proximal lesion of SVG-RCA.  EP consultation per cardiology.    LOS: [place] @TODAY @6 :28 AM

## 2020-09-28 NOTE — Progress Notes (Signed)
Progress Note  Patient Name: [name] Date of Encounter: 09/28/2020  Primary Cardiologist: Larae Grooms, MD   Subjective   Had dialysis late yesterday.  Apparently coming off dialysis, she had more ectopy.  Amiodarone discontinued. Currently nursing staff is telling me that the patient has not been taking p.o.  She has refused pills and food.  Inpatient Medications    Scheduled Meds: . allopurinol  100 mg Oral q AM  . brimonidine  1 drop Right Eye QHS   And  . brimonidine  2 drop Left Eye QHS  . calcium carbonate  1 tablet Oral Q lunch  . Chlorhexidine Gluconate Cloth  6 each Topical Daily  . Chlorhexidine Gluconate Cloth  6 each Topical Q0600  . Chlorhexidine Gluconate Cloth  6 each Topical Q0600  . cinacalcet  30 mg Oral Once per day on Tue Thu  . clopidogrel  75 mg Oral q AM  . feeding supplement (NEPRO CARB STEADY)  237 mL Oral TID BM  . insulin aspart  0-5 Units Subcutaneous QHS  . insulin aspart  0-6 Units Subcutaneous TID WC  . latanoprost  1 drop Both Eyes QHS  . multivitamin  1 tablet Oral QHS  . polycarbophil  625 mg Oral BID WC  . sevelamer carbonate  1,600 mg Oral BID WC  . sodium chloride flush  3 mL Intravenous Q12H  . sodium chloride flush  3 mL Intravenous Q12H  . sodium chloride flush  3 mL Intravenous Q12H  . timolol  1 drop Left Eye QHS   Continuous Infusions: . sodium chloride    . sodium chloride Stopped (09/25/20 1025)  . sodium chloride    . sodium chloride    . sodium chloride    . sodium chloride     PRN Meds: sodium chloride, sodium chloride, sodium chloride, sodium chloride, acetaminophen, alteplase, famotidine, heparin, lidocaine (PF), lidocaine-prilocaine, morphine injection, nitroGLYCERIN, oxyCODONE-acetaminophen, pentafluoroprop-tetrafluoroeth, sodium chloride flush, sodium chloride flush   Vital Signs    Vitals:   09/27/20 2000 09/27/20 2300 09/28/20 0000 09/28/20 0100  BP:      Pulse: (!) 54 (!) 55 (!) 59 (!) 59   Resp: _0 Temp: 98.2 F (36.8 C)     TempSrc: Axillary     SpO2: 99% 97% 97% 99%  Weight:      Height:        Intake/Output Summary (Last 24 hours) at 09/28/2020 0148 Last data filed at 09/27/2020 1220 Gross per 24 hour  Intake 281.6 ml  Output -  Net 281.6 ml   Filed Weights   09/26/20 1900 09/26/20 2320 09/27/20 0650  Weight: 82.1 kg 80 kg 79.4 kg    Telemetry    Heart rates now in the mid to 40s to low 50s.  Last night's episodic ventricular bigeminy and pauses with brief bursts of VT noted..- Personally Reviewed  ECG    No new EKG- Personally Reviewed  Physical Exam   GEN:  Although I was told that she was somewhat lethargic, she was wide-awake and smiling today.  Still very hard of hearing.  Seems little more comfortable but still has pain from CPR.     Neck:  Does not appear to have JVD. Cardiac:  Bradycardic with ectopy normal S1-S2.  Distant heart sounds.  No obvious M/R/G.  Respiratory:  Anterior fields seem somewhat clear.  Minimal effort.   GI:  Soft/NT/ND/decreased bowel sounds.  No HSM. MS:  T mostly chest pain  from CPR.  Puffy edema. Neuro:   Awake and alert.  Intermittently appears fatigued.  Family says that she has had moments of lucidity but mostly has been more confused.  I question how much of this is her inability to hear.  Labs    Chemistry Recent Labs  Lab 09/23/20 1937 09/24/20 0315 09/25/20 0424 09/26/20 0513 09/27/20 0110  NA 137   < > 133* 133* 136  K 3.3*   < > 4.6 4.5 3.8  CL 93*   < > 90* 88* 96*  CO2 34*   < > _0 GLUCOSE 200*   < > 194* 178* 170*  BUN 7*   < > 19 29* 15  CREATININE 2.74*   < > 4.46* 5.62* 3.48*  CALCIUM 7.7*   < > 8.5* 8.7* 8.6*  PROT 6.2*  --   --   --  5.7*  ALBUMIN 3.0*  --   --  2.6* 2.5*  AST 43*  --   --   --  139*  ALT 27  --   --   --  143*  ALKPHOS 75  --   --   --  66  BILITOT 0.6  --   --   --  0.7  GFRNONAA 17*   < > 9* 7* 13*  ANIONGAP 10   < > _1 < > = values in this  interval not displayed.     Hematology Recent Labs  Lab 09/24/20 0315 09/25/20 0424 09/26/20 1250  WBC 6.1 8.1 7.5  RBC 3.23* 3.09* 3.08*  HGB 11.0* 10.3* 10.2*  HCT 34.2* 33.7* 33.2*  MCV 105.9* 109.1* 107.8*  MCH 34.1* 33.3 33.1  MCHC 32.2 30.6 30.7  RDW 15.1 15.8* 15.9*  PLT 123* 129* 124*    Cardiac EnzymesNo results for input(s): TROPONINI in the last 168 hours. No results for input(s): TROPIPOC in the last 168 hours.   BNPNo results for input(s): BNP, PROBNP in the last 168 hours.   DDimer No results for input(s): DDIMER in the last 168 hours.   Radiology    No results found.  Cardiac Studies    Echo 09/24/2020 summary: EF 45%.  Basal inferolateral and basal inferior akinesis (consistent with occluded SVG-RCA).  Moderate enlarged RV with severely dysfunction.  Moderate biatrial enlargement.  MAC noted but no stenosis and mild MR.  Mild aortic sclerosis but no stenosis.   Cardiac cath 09/24/2020:  Severe native vessel CAD: 100% LAD following a 95% calcified lesion prior to SP1; 100% mid RCA following severe 80% stenosis, 100% AV groove LCx after OM1 with bridging collaterals to small PL branch, 75 to 80% calcified ostial OM1 (large bifurcating ramus-like vessel)  Grafts: NEW LESION: 100% ostial and proximal CTO of SVG-RCA (was a very small caliber vessel previously stented.  The nature of this occlusion is such that it does not appear to be acute-the patient not actively having chest pain and no ST elevations, minimal troponin. => Likely not a reasonable target for PCI ? SVG- 1st Diag has an anastomotic 50 to 60% stenosis of questionable significance (at this stage, the graft is the same caliber as a target vessel) ->   I chose not to attempt intervention on this location as she is hemodynamically clinically stable at this point. Would not want to complicate issues by potentially occluding another graft. ? Patent LIMA-LAD with no left-to-right collaterals  Mildly  reduced EF with inferobasal hypokinesis-45 to 50%.  Borderline elevated LVEDP of 15 mmHg  Significant discrepancy between aortic blood pressures and cuff blood pressures of at least 30 mmHg.  Cuff pressures are significantly lower.     Patient Profile     55F with CAD s/p CABG x3 (LIMA-LAD, SVG-diag, SVG-RCA), prior VF arrest in the setting of SVG occlusion , ESRD on iHD, post op AF, TIA, CAS s/p L CAE (2009), MDD, DM2, diverticulosis, GERD, glaucoma, and HTN who presents following recurrent LOC and subsequent witnessed arrest.  Recurrent VT on 09/24/2020-resuscitated with 90 seconds of CPR, no shock.  Amiodarone drip continued along with lidocaine.  Cardiac cath revealed likely subacute occlusion of SVG-RCA.  Not PCI target.  No other clear targets to explain ischemic VT.  Assessment & Plan   Principal Problem:   Ventricular tachycardia (HCC) Active Problems:   Paroxysmal atrial fibrillation (HCC)   Coronary artery disease involving native heart without angina pectoris   Coronary artery disease involving autologous vein bypass graft   Demand ischemia of myocardium (HCC)   Hyperlipidemia associated with type 2 diabetes mellitus ([place])   Essential hypertension   Hypokalemia   QT prolongation   ESRD on dialysis ([place])   Anemia of chronic disease   Diabetes mellitus type 2 in nonobese (HCC)   Syncope and collapse   - CAD s/p CABG with prior VT/VF arrest with cath showing occluded SVG requiring PCI in 2019.  Echo with mildly reduced EF of 45%.  Severely reduced RV function.  Moderate biatrial enlargement.  Mildly elevated right atrial pressures.  Principal Problem:   Ventricular tachycardia ([place]) as etiology for Syncope and collapse - concern for Ischemic VT - EMS notes indicate WCT rate 200, terminating prior to defibrillation. She did require CPR.   Recurrent event on 09/24/2020 roughly 11 AM short V. tach degenerating to VF/pulseless VT-90 minutes of CPR with no defibrillation.   Restored ROSC.  No angina.  Urgent cath with newly occluded SVG-RCA but otherwise stable.Marland Kitchen  Has had intermittent bursts of short 5 6 beat runs of the most VT but mostly bigeminy trigeminy with triplets and quadruplets.  Rate seems to be improving, especially for dialysis.  Would plan if she is taking p.o. to add amiodarone by mouth tomorrow to continue load. => If she is not tolerating p.o., or refuses p.o. then clearly we cannot continue to treat long-term IV and therefore goals of care need to change more toward palliative care.  If she is able to tolerate p.o., then would continue treatment.  Not an ICD candidate, but that does not mean that we did stop treating.  She is DNR-DNI, but we have not yet come to the decision of palliative care.  Demand ischemia: Never had chest pain in the initial presentation.  Current chest pain is not anginal it is musculoskeletal.  Initially minimal troponin, elevated postarrest.  Initial troponin elevation was minimal, there was pretty significant elevation following her arrest, suspect that this is all post arrest related and not ACS.    Active Problems:      Coronary artery disease involving native heart without angina pectoris /   Coronary artery disease involving autologous vein bypass graft  Urgent cath in setting of VT showed occlusion seems to be relatively subacute in nature.  I do not think this was an ACS presentation.  I think this is more scar related VT.  No other lesions available for PCI.  Troponin elevation was not present until post CPR.  If able to tolerate, would continue  Plavix, but okay to stop statin to avoid complicating the medication.  Would forego beta-blocker while she is on amiodarone if tolerated.     Hyperlipidemia associated with type 2 diabetes mellitus ([place]) -Per TRH, on standing insulin with sliding scale.   Stopping statin    Essential hypertension -not on any antihypertensives, blood pressure stable.  Blood  pressure has been stable.  I do not think she needs further lab draws.  We will draw some baseline labs and then pullfemoral line      ESRD on dialysis (HCC)/ Hypokalemia    Did not have dialysis until late last night.  This gives [place] about another 2 days to see how she progresses from a p.o. standpoint.  If she refuses to take p.o. then and is moving toward failure to thrive then I do not think it is reasonable to continue to dialyze.  We had a long discussion with the family to discuss goals of care.  It is not reasonable to offer dialysis and the patient not to.  In that setting allowing for uremic death is probably not unreasonable, and quite favorable over simply not feeling..      Paroxysmal atrial fibrillation (HCC) -was postop.  No recurrence seen on monitor.  Not on DOAC.   Anemia of chronic disease -> stable   Diabetes mellitus type 2 in nonobese (HCC)  Continues to be DNR-DNI; but we have not crossed the threshold for palliative or comfort care.  At this point it is still quite possible that she could recover to discharge.  She needs to be take p.o., build up strength and mobilize.  She has not had any more sustained ventricular tachycardia, but if she is taking p.o. we can switch to oral amiodarone.  On the other side is that if she were not to tolerate p.o., or refuse taking p.o., then the goals of care should switch to more comfort care.  The patient indicated that they would not want morphine drip, but would be okay for PRN bolusing.  Would defer this to the palliative care team.  However I do think that if the direction of care is palliative care that Further Dialysis Would Not Be warranted.    I spent 25 minutes talking about the case with CCU nursing team.  I do think she is fine to transfer out of the CCU to telemetry.  They will communicate this  to the attending provider. I then spent 35 to 40 minutes in discussion with the patient's family and additional 15 minutes  charting. Total time with patient and charting 80 minutes.  For questions or updates, please contact [place] Please consult [email or web address] for contact info under        Signed, [name], MD  09/28/2020, 1:48 AM

## 2020-09-28 NOTE — Progress Notes (Signed)
Progress Note  Patient Name: [name] Date of Encounter: 09/28/2020  Primary Cardiologist: Larae Grooms, MD   Subjective   Had a better day yesterday.  Actually now starting to take some pills.  Drinking some soup.  Inpatient Medications    Scheduled Meds: . allopurinol  100 mg Oral q AM  . brimonidine  1 drop Right Eye QHS   And  . brimonidine  2 drop Left Eye QHS  . calcium carbonate  1 tablet Oral Q lunch  . Chlorhexidine Gluconate Cloth  6 each Topical Daily  . Chlorhexidine Gluconate Cloth  6 each Topical Q0600  . Chlorhexidine Gluconate Cloth  6 each Topical Q0600  . cinacalcet  30 mg Oral Once per day on Tue Thu  . clopidogrel  75 mg Oral q AM  . feeding supplement (NEPRO CARB STEADY)  237 mL Oral TID BM  . insulin aspart  0-5 Units Subcutaneous QHS  . insulin aspart  0-6 Units Subcutaneous TID WC  . latanoprost  1 drop Both Eyes QHS  . multivitamin  1 tablet Oral QHS  . polycarbophil  625 mg Oral BID WC  . sevelamer carbonate  1,600 mg Oral BID WC  . sodium chloride flush  3 mL Intravenous Q12H  . sodium chloride flush  3 mL Intravenous Q12H  . sodium chloride flush  3 mL Intravenous Q12H  . timolol  1 drop Left Eye QHS   Continuous Infusions: . sodium chloride    . sodium chloride Stopped (09/25/20 1025)  . sodium chloride    . sodium chloride    . sodium chloride    . sodium chloride     PRN Meds: sodium chloride, sodium chloride, sodium chloride, sodium chloride, acetaminophen, alteplase, famotidine, heparin, lidocaine (PF), lidocaine-prilocaine, morphine injection, nitroGLYCERIN, oxyCODONE-acetaminophen, pentafluoroprop-tetrafluoroeth, sodium chloride flush, sodium chloride flush   Vital Signs    Vitals:   09/28/20 0500 09/28/20 0600 09/28/20 0700 09/28/20 0800  BP:      Pulse: 61 67 66 65  Resp: _0 Temp:    98 F (36.7 C)  TempSrc:    Oral  SpO2: 98% 97% 100% 98%  Weight:  79.2 kg    Height:        Intake/Output  Summary (Last 24 hours) at 09/28/2020 0905 Last data filed at 09/27/2020 1220 Gross per 24 hour  Intake 280 ml  Output -  Net 280 ml   Filed Weights   09/26/20 2320 09/27/20 0650 09/28/20 0600  Weight: 80 kg 79.4 kg 79.2 kg    Telemetry    Heart rates now are more in the 50s and 60s.  Still having some bigeminy with some pauses and bradycardia, stable.  No further runs of VT..- Personally Reviewed  ECG    No new study.  Personally Reviewed  Physical Exam   GEN:  Opens eyes to verbal stimuli.  Smiling, but seems weaker. Neck:  Difficult assess JVD, but does not seem to be elevated. Cardiac:  Regular rate and rhythm, normal S1 and S2.  Distant heart sounds but no obvious M/R/G.Marland Kitchen   Respiratory:  Poor effort.  Somewhat clear by anterior fields. GI:  Soft/NT/more active bowel sounds. MS:  Puffy edema but no pitting edema.  Sore from chest compressions. Neuro:   Does seem more fatigued and tired today.  Awake and alert but acknowledges being weak.  Labs    Chemistry Recent Labs  Lab 09/23/20 1937 09/24/20 0315 09/26/20 5003 09/27/20  0110 09/28/20 0358  NA 137   < > 133* 136 135  K 3.3*   < > 4.5 3.8 3.8  CL 93*   < > 88* 96* 94*  CO2 34*   < > _0 GLUCOSE 200*   < > 178* 170* 166*  BUN 7*   < > 29* 15 33*  CREATININE 2.74*   < > 5.62* 3.48* 5.34*  CALCIUM 7.7*   < > 8.7* 8.6* 8.4*  PROT 6.2*  --   --  5.7* 5.4*  ALBUMIN 3.0*  --  2.6* 2.5* 2.5*  AST 43*  --   --  139* 53*  ALT 27  --   --  143* 107*  ALKPHOS 75  --   --  66 70  BILITOT 0.6  --   --  0.7 0.7  GFRNONAA 17*   < > 7* 13* 8*  ANIONGAP 10   < > _1 < > = values in this interval not displayed.     Hematology Recent Labs  Lab 09/24/20 0315 09/25/20 0424 09/26/20 1250  WBC 6.1 8.1 7.5  RBC 3.23* 3.09* 3.08*  HGB 11.0* 10.3* 10.2*  HCT 34.2* 33.7* 33.2*  MCV 105.9* 109.1* 107.8*  MCH 34.1* 33.3 33.1  MCHC 32.2 30.6 30.7  RDW 15.1 15.8* 15.9*  PLT 123* 129* 124*    Cardiac  EnzymesNo results for input(s): TROPONINI in the last 168 hours. No results for input(s): TROPIPOC in the last 168 hours.   BNPNo results for input(s): BNP, PROBNP in the last 168 hours.   DDimer No results for input(s): DDIMER in the last 168 hours.   Radiology    No results found.  Cardiac Studies    Echo 09/24/2020 summary: EF 45%.  Basal inferolateral and basal inferior akinesis (consistent with occluded SVG-RCA).  Moderate enlarged RV with severely dysfunction.  Moderate biatrial enlargement.  MAC noted but no stenosis and mild MR.  Mild aortic sclerosis but no stenosis.   Cardiac cath 09/24/2020:  Severe native vessel CAD: 100% LAD following a 95% calcified lesion prior to SP1; 100% mid RCA following severe 80% stenosis, 100% AV groove LCx after OM1 with bridging collaterals to small PL branch, 75 to 80% calcified ostial OM1 (large bifurcating ramus-like vessel)  Grafts: NEW LESION: 100% ostial and proximal CTO of SVG-RCA (was a very small caliber vessel previously stented.  The nature of this occlusion is such that it does not appear to be acute-the patient not actively having chest pain and no ST elevations, minimal troponin. => Likely not a reasonable target for PCI ? SVG- 1st Diag has an anastomotic 50 to 60% stenosis of questionable significance (at this stage, the graft is the same caliber as a target vessel) ->   I chose not to attempt intervention on this location as she is hemodynamically clinically stable at this point. Would not want to complicate issues by potentially occluding another graft. ? Patent LIMA-LAD with no left-to-right collaterals  Mildly reduced EF with inferobasal hypokinesis-45 to 50%.  Borderline elevated LVEDP of 15 mmHg  Significant discrepancy between aortic blood pressures and cuff blood pressures of at least 30 mmHg.  Cuff pressures are significantly lower.     Patient Profile     51F with CAD s/p CABG x3 (LIMA-LAD, SVG-diag, SVG-RCA),  prior VF arrest in the setting of SVG occlusion , ESRD on iHD, post op AF, TIA, CAS s/p L CAE (2009), MDD, DM2,  diverticulosis, GERD, glaucoma, and HTN who presents following recurrent LOC and subsequent witnessed arrest.  Recurrent VT on 09/24/2020-resuscitated with 90 seconds of CPR, no shock.  Amiodarone drip continued along with lidocaine.  Cardiac cath revealed likely subacute occlusion of SVG-RCA.  Not PCI target.  No other clear targets to explain ischemic VT.  Assessment & Plan   Principal Problem:   Ventricular tachycardia (HCC) Active Problems:   Paroxysmal atrial fibrillation (HCC)   Coronary artery disease involving native heart without angina pectoris   Coronary artery disease involving autologous vein bypass graft   Demand ischemia of myocardium (HCC)   Hyperlipidemia associated with type 2 diabetes mellitus ([place])   Essential hypertension   Hypokalemia   QT prolongation   ESRD on dialysis ([place])   Anemia of chronic disease   Diabetes mellitus type 2 in nonobese (HCC)   Syncope and collapse   Pressure injury of skin   - CAD s/p CABG with prior VT/VF arrest with cath showing occluded SVG requiring PCI in 2019.  Echo with mildly reduced EF of 45%.  Severely reduced RV function.  Moderate biatrial enlargement.  Mildly elevated right atrial pressures.  EMS notes indicate WCT rate 200, terminating prior to defibrillation. She did require CPR.  Recurrent event on 09/24/2020 roughly 11 AM short V. tach degenerating to VF/pulseless VT-90 minutes of CPR with no defibrillation.  Restored ROSC.  No angina. Urgent cath with newly occluded SVG-RCA but otherwise stable.  Principal Problem:   Ventricular tachycardia ([place]) as etiology for Syncope and collapse - concern for Ischemic VT -   Actually relatively stable.  Her usual numbers, but now is over 24 hours from being on amiodarone panel.  She is now taking p.o.  Not considered defibrillator candidate which is reasonable.  DNR/DNI  currently wearing => signs of recovery and regaining strength.  Needs.  She is able to take p.o. in order to maintain strength.  Otherwise will likely start Tikosyn complaint failure to thrive.  Full arterial line today.  Demand ischemia: Never had chest pain in the initial presentation.  Current chest pain is not anginal it is musculoskeletal.  Troponin elevation probably related to CPR and prolonged arrest.  Pancreatic lesion likely not the acute event.  Active Problems:      Coronary artery disease involving native heart without angina pectoris /   Coronary artery disease involving autologous vein bypass graft  Urgent cath in setting of VT showed occlusion seems to be relatively subacute in nature.  I do not think this was an ACS presentation.  I think this is more scar related VT.  No other lesions available for PCI.  Troponin elevation was not present until post CPR.  Continue Plavix, okay to hold other medications such as aspirin and statin.  No need for beta-blocker but we will use oral amiodarone.     Hyperlipidemia associated with type 2 diabetes mellitus ([employer]) -Per TRH, on standing insulin with sliding scale.   Stop statin    Essential hypertension -not on any antihypertensives, blood pressure stable. Blood pressures actually doing pretty well.  We can pull arterial line.       ESRD on dialysis (HCC)/ Hypokalemia    Would be due for dialysis today.    If she starts to show signs of failure to thrive and not taking p.o., may want to reconsider.     Paroxysmal atrial fibrillation (HCC) -was postop.  No recurrence seen on monitor.  Not on DOAC.   Anemia of  chronic disease -> stable   Diabetes mellitus type 2 in nonobese (HCC)  Stable transfer to floor. ->  They need to understand that vital signs will be difficult.  Her cuff pressures are significantly lower than A-line pressures.  Cannot go to floor with A-line in place.  Continues to be DNR-DNI; but we have not crossed  the threshold for palliative or comfort care.  At this point it is still quite possible that she could recover to discharge.  She needs to be take p.o., build up strength and mobilize.  She has not had any more sustained ventricular tachycardia, but if she is taking p.o. we can switch to oral amiodarone.  On the other side is that if she were not to tolerate p.o., or refuse taking p.o., then the goals of care should switch to more comfort care.  The patient indicated that they would not want morphine drip, but would be okay for PRN bolusing.  Would defer this to the palliative care team.  However I do think that if the direction of care is palliative care that Further Dialysis Would Not Be warranted.     For questions or updates, please contact [place] Please consult [email or web address] for contact info under        Signed, [name], MD  09/28/2020, 9:05 AM

## 2020-09-28 NOTE — Progress Notes (Signed)
Initial Nutrition Assessment  DOCUMENTATION CODES:   Not applicable  INTERVENTION:   Change diet to dysphagia 3   Nepro Shake po TID, each supplement provides 425 kcal and 19 grams protein  Magic cup TID with meals, each supplement provides 290 kcal and 9 grams of protein  Snacks BID  Renal MVI daily   NUTRITION DIAGNOSIS:   Inadequate oral intake related to poor appetite as evidenced by meal completion < 25%.  GOAL:   Patient will meet greater than or equal to 90% of their needs  MONITOR:   PO intake,Supplement acceptance,Weight trends,Labs,I & O's  REASON FOR ASSESSMENT:   Rounds    ASSESSMENT:   Patient with PMH significant for ESRD on HD, CAD, carotid stenosis, afib, DM, essential HTN, HLD, and is hard of hearing. Presents this admission with multiple syncopal episodes.   3/14- v tach, canceled L cardiac cath  Pt discussed during ICU rounds and with RN.   Family member at beside provided history. Patient's intake has declined over the last couple weeks. States during this time she consumed small meals that consisted of pudding, ice cream, and other softer foods. States she has had complication with swallowing dry foods likes meats and vegetables. Family does not want to pursue swallowing evaluation. RD to change meat options to chopped with gravy. Patient taking 1-2 Nepros daily and parfaits from chicken fil a. Meal completions documented as 0-25% since admit. Family does not wish to pursue feeding tube with artificial feeding.   EDW: 76.5 kg  Current weight: 79.2 kg   Medications: sensipar, SS novolog, fibercon BID, renvela Labs: CBG 145-177  NUTRITION - FOCUSED PHYSICAL EXAM:  Flowsheet Row Most Recent Value  Orbital Region No depletion  Upper Arm Region No depletion  Thoracic and Lumbar Region Unable to assess  Buccal Region Unable to assess  Temple Region Mild depletion  Clavicle Bone Region No depletion  Clavicle and Acromion Bone Region No  depletion  Scapular Bone Region Unable to assess  Dorsal Hand Unable to assess  Patellar Region No depletion  Anterior Thigh Region No depletion  Posterior Calf Region Unable to assess  Edema (RD Assessment) Moderate  Hair Reviewed  Eyes Reviewed  Mouth Unable to assess  Skin Reviewed  Nails Reviewed     Diet Order:   Diet Order            Diet Heart Room service appropriate? Yes; Fluid consistency: Thin  Diet effective now                 EDUCATION NEEDS:   Not appropriate for education at this time  Skin:  Skin Assessment: Skin Integrity Issues: Skin Integrity Issues:: Stage I Stage I: buttocks  Last BM:  3/12  Height:   Ht Readings from Last 1 Encounters:  09/23/20 5\' 4"  (1.626 m)    Weight:   Wt Readings from Last 1 Encounters:  09/28/20 79.2 kg    BMI:  Body mass index is 29.97 kg/m.  Estimated Nutritional Needs:   Kcal:  1800-2000 kcal  Protein:  90-105 grams  Fluid:  1000 ml + UOP   Mariana Single RD, LDN Clinical Nutrition Pager listed in [place]

## 2020-09-28 NOTE — Progress Notes (Signed)
Daily Progress Note   Patient Name: [name]       Date: 09/28/2020 DOB: [birth year]  Age: 80 y.o. MRN#: [id number] Attending Physician: Mendel Corning, MD Primary Care Physician: Josetta Huddle, MD Admit Date: 09/23/2020  Reason for Consultation/Follow-up: Establishing goals of care  Subjective: Chart review performed. Received report from primary RN - no acute concerns. RN reports patient has had complaints of dry eyes - will order drops.   Went to visit patient at bedside - granddaughter/Tiffany and her fiance/Cody were present. Patient was lying in bed awake, alert, oriented, and able to participate in conversation. Patient states she has burning pain on her sacrum. RN present in the room confirms patient has a stage I pressure injury to sacral area -states pink foam is in place. RN provided pain medication during visit. No signs or non-verbal gestures of pain or discomfort noted. No respiratory distress, increased work of breathing, or secretions noted. Patient does appear very weak and frail. Patient has completed dialysis today and tolerated well, but blood pressures remain soft per nursing.  During visit family are feeding patient fruit parfait from [employer]. Patient is accepting of food and tolerating well. Ms. Nickolas states that she does feel hungry and is interested in eating. I reviewed with patient and family goals and medical plan. Reviewed that dialysis has been successful x2 and plan is for transfer out of ICU when bed is available. Patient and family are agreeable that goal is to work with PT/OT to see how she does tomorrow. We reviewed that adequate oral intake is needed for a successful rehabilitation experience, wound healing, as well as for overall improvement to sustain  patient long term - family and patient expressed understanding. Family and patient are aware she is not a candidate for ICD per EP.   Noted, albumin was 3.0 when admitted on 09/23/20.   All questions and concerns addressed. Encouraged to call with questions and/or concerns. PMT card provided.    Length of Stay: 5  Current Medications: Scheduled Meds:  . allopurinol  100 mg Oral q AM  . amiodarone  200 mg Oral BID  . brimonidine  1 drop Right Eye QHS   And  . brimonidine  2 drop Left Eye QHS  . calcium carbonate  1 tablet Oral Q lunch  .  Chlorhexidine Gluconate Cloth  6 each Topical Daily  . Chlorhexidine Gluconate Cloth  6 each Topical Q0600  . Chlorhexidine Gluconate Cloth  6 each Topical Q0600  . cinacalcet  30 mg Oral Once per day on Tue Thu  . clopidogrel  75 mg Oral q AM  . feeding supplement (NEPRO CARB STEADY)  237 mL Oral TID BM  . insulin aspart  0-5 Units Subcutaneous QHS  . insulin aspart  0-6 Units Subcutaneous TID WC  . latanoprost  1 drop Both Eyes QHS  . multivitamin  1 tablet Oral QHS  . polycarbophil  625 mg Oral BID WC  . sevelamer carbonate  1,600 mg Oral BID WC  . sodium chloride flush  3 mL Intravenous Q12H  . sodium chloride flush  3 mL Intravenous Q12H  . sodium chloride flush  3 mL Intravenous Q12H  . timolol  1 drop Left Eye QHS    Continuous Infusions: . sodium chloride    . sodium chloride Stopped (09/25/20 1025)  . sodium chloride    . sodium chloride    . sodium chloride    . sodium chloride      PRN Meds: sodium chloride, sodium chloride, sodium chloride, sodium chloride, acetaminophen, alteplase, famotidine, heparin, lidocaine (PF), lidocaine-prilocaine, morphine injection, nitroGLYCERIN, oxyCODONE-acetaminophen, pentafluoroprop-tetrafluoroeth, polyvinyl alcohol, sodium chloride flush, sodium chloride flush  Physical Exam Vitals and nursing note reviewed.  Constitutional:      General: She is not in acute distress.    Appearance: She  is ill-appearing.  Eyes:     Comments: Dry/irritated eyes  Pulmonary:     Effort: No respiratory distress.  Skin:    General: Skin is warm and dry.  Neurological:     Mental Status: She is alert and oriented to person, place, and time.     Motor: Weakness present.  Psychiatric:        Attention and Perception: Attention normal.        Behavior: Behavior is cooperative.        Cognition and Memory: Cognition and memory normal.             Vital Signs: BP 96/83 (BP Location: Left Arm)   Pulse 66   Temp (!) 97.3 F (36.3 C) (Oral)   Resp 18   Ht 5\' 4"  (1.626 m)   Wt 78 kg   SpO2 100%   BMI 29.52 kg/m  SpO2: SpO2: 100 % O2 Device: O2 Device: Nasal Cannula O2 Flow Rate: O2 Flow Rate (L/min): 3 L/min  Intake/output summary:   Intake/Output Summary (Last 24 hours) at 09/28/2020 1829 Last data filed at 09/28/2020 1651 Gross per 24 hour  Intake 120 ml  Output 1500 ml  Net -1380 ml   LBM: Last BM Date: 09/23/20 Baseline Weight: Weight: 77.1 kg Most recent weight: Weight: 78 kg       Palliative Assessment/Data: 30%      Patient Active Problem List   Diagnosis Date Noted  . Pressure injury of skin 09/28/2020  . Coronary artery disease involving autologous vein bypass graft 09/25/2020  . Demand ischemia of myocardium ([place]) 09/25/2020  . Syncope and collapse 09/23/2020  . Body mass index (BMI) 32.0-32.9, adult 08/01/2020  . Cervical disc disorder 08/01/2020  . Cough variant asthma 08/01/2020  . Dependence on renal dialysis ([place]) 08/01/2020  . Diabetic renal disease ([place]) 08/01/2020  . Diabetic retinopathy associated with type 2 diabetes mellitus ([place]) 08/01/2020  . Edema 08/01/2020  . Encounter for general adult medical  examination without abnormal findings 08/01/2020  . Gout 08/01/2020  . Hereditary and idiopathic neuropathy, unspecified 08/01/2020  . Hyperglycemia due to type 2 diabetes mellitus ([place]) 08/01/2020  . Joint pain 08/01/2020  . 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 Osteoarthritis of knee 08/01/2020  . Other long term (current) drug therapy 08/01/2020  . Other polyosteoarthritis 08/01/2020  . Pain in unspecified knee 08/01/2020  . Proteinuria 08/01/2020  . Pure hypercholesterolemia 08/01/2020  . Pure hypertriglyceridemia 08/01/2020  . Urinary tract infectious disease 08/01/2020  . Vitamin D deficiency 08/01/2020  . Vomiting 08/01/2020  . Other disorders of phosphorus metabolism 01/11/2020  . Encounter for removal of sutures 12/04/2019  . Shortness of breath 07/29/2019  . Anaphylactic shock, unspecified, sequela 04/23/2019  . Secondary hyperparathyroidism of renal origin ([place]) 09/21/2018  . Iron deficiency anemia, unspecified 06/08/2018  . Hemorrhoids   . Diabetes mellitus type 2 in nonobese (HCC)   . Anemia, chronic disease   . Labile blood pressure   . Poorly controlled type 2 diabetes mellitus with peripheral neuropathy ([place])   . Debility   . Benign essential HTN   . Bradycardia   . Ventricular tachycardia ([place])   . Enteritis due to Clostridium difficile   . ESRD on dialysis ([place])   . Diabetic peripheral neuropathy ([place])   . Acute blood loss anemia   . Anemia of chronic disease   . Aspiration into airway   . NSTEMI (non-ST elevated myocardial infarction) ([place])   . QT prolongation 04/03/2018  . Acute respiratory failure with hypoxia ([place])   . Aspiration pneumonia due to gastric secretions ([place])   . Cardiac arrest ([place]) 04/02/2018  . Mild protein-calorie malnutrition ([place]) 03/13/2018  . Anxiety 05/13/2017  . Panic attack 05/13/2017  . SAH (subarachnoid hemorrhage) ([place]) 02/03/2017  . Fall 02/03/2017  . Spinal stenosis, lumbar region 03/02/2016  . Trochanteric bursitis of left hip 01/03/2016  . Generalized weakness 12/21/2015  . Hypokalemia 12/21/2015  . Lumbar back pain with radiculopathy affecting left lower extremity 12/04/2015  . Coronary artery disease involving native coronary artery of native heart  without angina pectoris   . CRI (chronic renal insufficiency), stage 4 (severe) (HCC)   . Urinary retention   . Anemia of chronic kidney failure   . Hx of gout   . Thrombocytopenia ([place])   . S/P lumbar discectomy 12/01/2015  . 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 Acute encephalopathy   . Chronic kidney disease (CKD), stage V ([place]) 07/13/2015  . Glaucoma   . Paroxysmal atrial fibrillation (HCC)   . Coronary artery disease involving native heart without angina pectoris   . Depression   . Stroke ([place])   . Diabetes mellitus without complication ([place])   . Gastroesophageal reflux disease with esophagitis   . Carotid artery stenosis 04/21/2015  . Essential hypertension 06/08/2014  . Hyperlipidemia associated with type 2 diabetes mellitus ([place]) 06/08/2014  . Occlusion and stenosis of carotid artery without mention of cerebral infarction 01/01/2012    Palliative Care Assessment & Plan   Patient Profile: 80 y.o.femalewith past medical history of ESRD on dialysis TTS, coronary artery disease s/p CABG, carotid stenosis, atrial fibrillation, previous history of non-sustained v-tach, DM2, HTN, HLD, and hearing loss presenting to the emergency departmenton3/12/2022with multiple episodes of syncope.She reportedly had an episode right after dialysis. Had another episode at home, became unresponsive, and family started CPR. When EMS arrived, monitor showed nonsustained v-tach bigeminy, lidocaine infusion was  started.  ED Course: On arrival to ED patient was back in sinus rhythm. Chest x-ray showed left lower lobe consolidation probably atelectasis versus infection or aspiration. She was admitted to [facility] for further evaluation of syncopal episodes.   On 3/13, patient was undergoing echocardiogram and developed symptomatic v-tach requiring CPR. She underwent emergent cardiac cath with findings of new 100% stenosis in  multiple vessels. Patient was seen by EP, but is not a candidate for ICD.  Assessment: Syncopal episodes with collapse Symptomatic ventricular tachycardia Paroxysmal atrial fibrillation Coronary artery disease DM with nephropathy Essential hypertension ESRD on HD Anemia of chronic disease Generalized debility Deconditioned state Failure to thrive   Recommendations/Plan:  Continue current full scope medical treatment  Continue DNR/DNI as previously documented  Patient is willing and motivated to work with PT/OT  Patient and family understand importance of adequate nutritional needs in context of positive rehabilitation experience/strength building, wound healing, and overall needs to sustain long term. Patient also understands importance of tolerating PO intake in regards to medication management   Ordered liquifilm tears PRN for dry/irritated eyes  Ordered air mattress to avert further pressure injury to sacrum  PMT will continue to follow and support holistically  Goals of Care and Additional Recommendations:  Limitations on Scope of Treatment: Full Scope Treatment  Code Status:    Code Status Orders  (From admission, onward)         Start     Ordered   09/25/20 1446  Do not attempt resuscitation (DNR)  Continuous       Question Answer Comment  In the event of cardiac or respiratory ARREST Do not call a "code blue"   In the event of cardiac or respiratory ARREST Do not perform Intubation, CPR, defibrillation or ACLS   In the event of cardiac or respiratory ARREST Use medication by any route, position, wound care, and other measures to relive pain and suffering. May use oxygen, suction and manual treatment of airway obstruction as needed for comfort.      09/25/20 1445        Code Status History    Date Active Date Inactive Code Status Order ID Comments User Context   09/25/2020 1258 09/25/2020 1445 Partial Code [id number]  Vickii Penna, RN Inpatient    09/24/2020 0155 09/25/2020 1258 Full Code [id number]  Elwyn Reach, MD Inpatient   04/16/2018 1024 04/28/2018 1821 Partial Code [id number]  Roney Jaffe, MD Inpatient   04/15/2018 1620 04/15/2018 1621 DNR [id number]  Cathlyn Parsons, PA-C Inpatient   04/15/2018 1620 04/15/2018 1620 Full Code [id number]  Cathlyn Parsons, PA-C Inpatient   04/13/2018 1711 04/15/2018 1542 DNR [id number]  Roney Jaffe, MD Inpatient   04/02/2018 2040 04/13/2018 1711 Full Code [id number]  Germain Osgood, PA-C ED   12/21/2015 1721 12/25/2015 1606 Partial Code [id number]  Samella Parr, NP Inpatient   12/04/2015 1653 12/15/2015 1451 Full Code [id number]  Bary Leriche, PA-C Inpatient   07/13/2015 1955 07/17/2015 1637 Partial Code [id number]  Ivor Costa, MD ED   04/21/2015 1658 04/22/2015 1624 Full Code [id number]  Gabriel Earing, PA-C Inpatient   07/29/2011 1802 08/03/2011 1434 Full Code [id number]  Raquel James, RN Inpatient   Advance Care Planning Activity       Prognosis:   Unable to determine; guarded   Discharge Planning:  To Be Determined  Care plan was discussed with primary RN, patient, patient's family  Thank you for allowing the Palliative Medicine Team to assist  in the care of this patient.   Total Time 25 minutes Prolonged Time Billed  no       Greater than 50%  of this time was spent counseling and coordinating care related to the above assessment and plan.  Lin Landsman, NP  Please contact Palliative Medicine Team phone at [phone] for questions and concerns.

## 2020-09-28 NOTE — Progress Notes (Signed)
[facility]                                                                              Patient Demographics  [name], is a 80 y.o. female, DOB - 05-19-1941, VPX:[id number]  Admit date - 09/23/2020   Admitting Physician Elwyn Reach, MD  Outpatient Primary MD for the patient is Josetta Huddle, MD  Outpatient specialists:   LOS - 5  days   Medical records reviewed and are as summarized below:    Chief Complaint  Patient presents with  . Seizures       Brief summary   80 y.o.femalewith medical history significant ofend-stage renal disease on hemodialysis TTS, coronary artery disease, carotid stenosis, atrial fibrillation, previous history of nonsustained V. tach, diabetes, essential hypertension, hyperlipidemia, hard of hearing who presented with multiple episodes of syncope prior to admit, later found to have vtach by EMS. Pt was admitted for further work up.  Patient was scheduled to undergo left heart cath on 3/14 when she sustained an episode of symptomatic V. Tach (code blue), requiring brief chest compressions with ROSC.  Underwent emergent cath on 3/13 with new 100% ostial and proximal CTO of SVG RCA.  EP was consulted, not a candidate for ICD.  CT angio showed no PE.  Patient continued to have several episodes of nonsustained V. tach or ventricular bigeminy, hypoxia.  Cardiology recommended amiodarone, no beta-blocker due to bradycardia and amiodarone. Palliative medicine following, goals of care discussed, DNR/DNI, for now continue full scope medical treatment.  Nephrology following for hemodialysis.   Assessment & Plan    Principal Problem: Recurrent symptomatic ventricular tachycardia with multiple syncopal episodes ([place]) -Cardiac cath on 3/13 with findings of new 100% ostial and proximal CTO of SVG RCA. - seen by EP, not a candidate for ICD. -Palliative medicine was consulted, goals of care discussed, DNR/DNI, full scope of treatment.    -Continue aspirin, Plavix, statin -Cardiology following, continue amiodarone, now taking p.o., transitioned to oral amiodarone   Paroxysmal atrial fibrillation-postop -Not on beta-blocker, Received amiodarone bolus with drip. -Currently on oral amiodarone -Cardiology following, not a candidate for anticoagulation due to previous brain bleed  Diabetes mellitus type 2, IDDM, on long-term insulin, with nephropathy -Hemoglobin A1c 5.0 -CBGs fair, continue sliding scale insulin   Essential hypertension BP currently stable, not on any antihypertensives  ESRD on hemodialysis, TTS -Nephrology following, continue dialysis per schedule  Anemia of chronic disease -Continue ESA, H&H currently stable  Transaminitis -Possibly due to #1, hypotension, no acute complaints/abdominal pain -Currently on Lipitor, repeat CMET in am, if trending up, will hold Lipitor  Generalized debility, very deconditioned, FTT - will transfer to the floor, start PT OT, increase mobility -Currently DNR DNI, palliative care team following  Obesity Estimated body mass index is 29.97 kg/m as calculated from the following:   Height as of this encounter: 5\' 4"  (1.626 m).   Weight as of this encounter: 79.2 kg.  Code Status: DNR/DNI DVT Prophylaxis:  Heparin subcu  Level of Care: Level of care: Telemetry Cardiac Family Communication: Discussed all imaging results, lab results, explained to the patient's daughter, 2 granddaughters at  the bedside on 3/16   Disposition Plan:     Status is: Inpatient, transfer to telemetry floor  Remains inpatient appropriate because:Inpatient level of care appropriate due to severity of illness   Dispo: The patient is from: Home              Anticipated d/c is to: TBD              Patient currently is not medically stable to d/c.    Difficult to place patient No  Time Spent in minutes   25 minutes  Procedures:  Left heart catheterization 3/13  Consultants:    Cardiology Palliative medicine Nephrology  Antimicrobials:   Anti-infectives (From admission, onward)   None         Medications  Scheduled Meds: . allopurinol  100 mg Oral q AM  . amiodarone  200 mg Oral BID  . brimonidine  1 drop Right Eye QHS   And  . brimonidine  2 drop Left Eye QHS  . calcium carbonate  1 tablet Oral Q lunch  . Chlorhexidine Gluconate Cloth  6 each Topical Daily  . Chlorhexidine Gluconate Cloth  6 each Topical Q0600  . Chlorhexidine Gluconate Cloth  6 each Topical Q0600  . cinacalcet  30 mg Oral Once per day on Tue Thu  . clopidogrel  75 mg Oral q AM  . feeding supplement (NEPRO CARB STEADY)  237 mL Oral TID BM  . insulin aspart  0-5 Units Subcutaneous QHS  . insulin aspart  0-6 Units Subcutaneous TID WC  . latanoprost  1 drop Both Eyes QHS  . multivitamin  1 tablet Oral QHS  . polycarbophil  625 mg Oral BID WC  . sevelamer carbonate  1,600 mg Oral BID WC  . sodium chloride flush  3 mL Intravenous Q12H  . sodium chloride flush  3 mL Intravenous Q12H  . sodium chloride flush  3 mL Intravenous Q12H  . timolol  1 drop Left Eye QHS   Continuous Infusions: . sodium chloride    . sodium chloride Stopped (09/25/20 1025)  . sodium chloride    . sodium chloride    . sodium chloride    . sodium chloride     PRN Meds:.sodium chloride, sodium chloride, sodium chloride, sodium chloride, acetaminophen, alteplase, famotidine, heparin, lidocaine (PF), lidocaine-prilocaine, morphine injection, nitroGLYCERIN, oxyCODONE-acetaminophen, pentafluoroprop-tetrafluoroeth, sodium chloride flush, sodium chloride flush      Subjective:   [name] was seen and examined today.  No acute events overnight per RN.  Patient is much more alert and oriented today.  No acute complaints.  No active nausea vomiting or shortness of breath.  Denies any pain  Objective:   Vitals:   09/28/20 0500 09/28/20 0600 09/28/20 0700 09/28/20 0800  BP:      Pulse: 61 67 66 65   Resp: 18 18 15 16   Temp:    98 F (36.7 C)  TempSrc:    Oral  SpO2: 98% 97% 100% 98%  Weight:  79.2 kg    Height:        Intake/Output Summary (Last 24 hours) at 09/28/2020 1102 Last data filed at 09/27/2020 1220 Gross per 24 hour  Intake 100 ml  Output -  Net 100 ml     Wt Readings from Last 3 Encounters:  09/28/20 79.2 kg  07/03/20 74.8 kg  05/17/20 77.1 kg   Physical Exam  General: Alert and oriented, appears comfortable, pleasant, deconditioned  Cardiovascular: S1 S2 clear, RRR.  No pedal edema b/l  Respiratory: Diminished breath sounds in the bases  Gastrointestinal: Soft, nontender, nondistended, NBS  Ext: no pedal edema bilaterally  Neuro: no new deficits  Musculoskeletal: No cyanosis, clubbing  Skin: No rashes  Psych: alert and awake, conversant  Data Reviewed:  I have personally reviewed following labs and imaging studies  Micro Results Recent Results (from the past 240 hour(s))  Resp Panel by RT-PCR (Flu A&B, Covid) Nasopharyngeal Swab     Status: None   Collection Time: 09/23/20  7:59 PM   Specimen: Nasopharyngeal Swab; Nasopharyngeal(NP) swabs in vial transport medium  Result Value Ref Range Status   SARS Coronavirus 2 by RT PCR NEGATIVE NEGATIVE Final    Comment: (NOTE) SARS-CoV-2 target nucleic acids are NOT DETECTED.  The SARS-CoV-2 RNA is generally detectable in upper respiratory specimens during the acute phase of infection. The lowest concentration of SARS-CoV-2 viral copies this assay can detect is 138 copies/mL. A negative result does not preclude SARS-Cov-2 infection and should not be used as the sole basis for treatment or other patient management decisions. A negative result may occur with  improper specimen collection/handling, submission of specimen other than nasopharyngeal swab, presence of viral mutation(s) within the areas targeted by this assay, and inadequate number of viral copies(<138 copies/mL). A negative result must be  combined with clinical observations, patient history, and epidemiological information. The expected result is Negative.  Fact Sheet for Patients:  EntrepreneurPulse.com.au  Fact Sheet for Healthcare Providers:  IncredibleEmployment.be  This test is no t yet approved or cleared by the [place] FDA and  has been authorized for detection and/or diagnosis of SARS-CoV-2 by FDA under an Emergency Use Authorization (EUA). This EUA will remain  in effect (meaning this test can be used) for the duration of the COVID-19 declaration under Section 564(b)(1) of the Act, 21 U.S.C.section 360bbb-3(b)(1), unless the authorization is terminated  or revoked sooner.       Influenza A by PCR NEGATIVE NEGATIVE Final   Influenza B by PCR NEGATIVE NEGATIVE Final    Comment: (NOTE) The Xpert Xpress SARS-CoV-2/FLU/RSV plus assay is intended as an aid in the diagnosis of influenza from Nasopharyngeal swab specimens and should not be used as a sole basis for treatment. Nasal washings and aspirates are unacceptable for Xpert Xpress SARS-CoV-2/FLU/RSV testing.  Fact Sheet for Patients: EntrepreneurPulse.com.au  Fact Sheet for Healthcare Providers: IncredibleEmployment.be  This test is not yet approved or cleared by the [place] FDA and has been authorized for detection and/or diagnosis of SARS-CoV-2 by FDA under an Emergency Use Authorization (EUA). This EUA will remain in effect (meaning this test can be used) for the duration of the COVID-19 declaration under Section 564(b)(1) of the Act, 21 U.S.C. section 360bbb-3(b)(1), unless the authorization is terminated or revoked.  Performed at [facility], [place] [address] [place], [place] [address]   MRSA PCR Screening     Status: None   Collection Time: 09/24/20 12:08 PM   Specimen: Nasopharyngeal  Result Value Ref Range Status   MRSA by PCR NEGATIVE NEGATIVE Final     Comment:        The GeneXpert MRSA Assay (FDA approved for NASAL specimens only), is one component of a comprehensive MRSA colonization surveillance program. It is not intended to diagnose MRSA infection nor to guide or monitor treatment for MRSA infections. Performed at [facility], [place] [address], [place], [place] [address]     Radiology Reports CT Head Wo Contrast  Result Date: 09/23/2020 CLINICAL DATA:  Encephalopathy EXAM: CT HEAD WITHOUT CONTRAST TECHNIQUE: Contiguous axial images were obtained from the base of the skull through the vertex without intravenous contrast. COMPARISON:  None. FINDINGS: Brain: There is no mass, hemorrhage or extra-axial collection. The size and configuration of the ventricles and extra-axial CSF spaces are normal. There is hypoattenuation of the white matter, most commonly indicating chronic small vessel disease. Vascular: Atherosclerotic calcification of the internal carotid arteries at the skull base. No abnormal hyperdensity of the major intracranial arteries or dural venous sinuses. Skull: The visualized skull base, calvarium and extracranial soft tissues are normal. Sinuses/Orbits: Right maxillary sinus mucosal thickening. The orbits are normal. IMPRESSION: Chronic small vessel disease without acute intracranial abnormality. Electronically Signed   By: Ulyses Jarred M.D.   On: 09/23/2020 20:47   CT ANGIO CHEST PE W OR WO CONTRAST  Result Date: 09/24/2020 CLINICAL DATA:  History of syncope and ventricular tachycardia EXAM: CT ANGIOGRAPHY CHEST WITH CONTRAST TECHNIQUE: Multidetector CT imaging of the chest was performed using the standard protocol during bolus administration of intravenous contrast. Multiplanar CT image reconstructions and MIPs were obtained to evaluate the vascular anatomy. CONTRAST:  15mL OMNIPAQUE IOHEXOL 350 MG/ML SOLN COMPARISON:  09/23/2020 FINDINGS: Cardiovascular: This is a technically adequate opacification of the  pulmonary vasculature. No filling defects or pulmonary emboli. The heart is enlarged without pericardial effusion. Normal caliber of the thoracic aorta. Extensive atherosclerosis of the aorta and coronary vasculature. Postsurgical changes from previous CABG. Mediastinum/Nodes: No enlarged mediastinal, hilar, or axillary lymph nodes. Thyroid gland, trachea, and esophagus demonstrate no significant findings. Lungs/Pleura: There are moderate bilateral pleural effusions volume estimated less than 1 L each. Dense bilateral dependent lower lobe consolidation consistent with atelectasis. No pneumothorax. Central airways are patent. Upper Abdomen: No acute abnormality. Musculoskeletal: There are minimally displaced acute right anterolateral fourth through seventh rib fractures, likely related to recent CPR. Numerous other prior bilateral healed rib fractures are also noted. No other acute bony abnormalities. Reconstructed images demonstrate no additional findings. Review of the MIP images confirms the above findings. IMPRESSION: 1. No evidence of pulmonary embolus. 2. Moderate bilateral pleural effusions with dense bilateral lower lobe atelectasis. 3. Right anterolateral fourth through seventh acute rib fractures consistent with recent CPR. 4.  Aortic Atherosclerosis (ICD10-I70.0). Electronically Signed   By: Randa Ngo M.D.   On: 09/24/2020 21:17   MR BRAIN WO CONTRAST  Result Date: 09/24/2020 CLINICAL DATA:  37-year-old female with recurrent syncope and confusion. EXAM: MRI HEAD WITHOUT CONTRAST TECHNIQUE: Multiplanar, multiecho pulse sequences of the brain and surrounding structures were obtained without intravenous contrast. COMPARISON:  Head CT 09/23/2020.  Brain MRI 07/13/2015. FINDINGS: Brain: No restricted diffusion to suggest acute infarction. No midline shift, mass effect, evidence of mass lesion, ventriculomegaly, extra-axial collection or acute intracranial hemorrhage. Cervicomedullary junction and  pituitary are within normal limits. Mild generalized cerebral volume loss since 2016. But gray and white matter signal remains largely normal for age. No cortical encephalomalacia. But there are occasional chronic micro hemorrhages in the brain, including the posterior right frontal lobe (series 14, image 37) and left cerebellum (image 13). Furthermore, there is intrinsic T1 hyperintensity in the bilateral globus pallidus since 2016 (series 16, image 30 on the left. Vascular: Major intracranial vascular flow voids are stable since 2016. 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 Mild associated visible cervical spinal stenosis, including at C2-C3. Heterogeneous bone marrow signal but within normal limits.  Sinuses/Orbits: Negative orbits. Mild to moderate right maxillary sinus mucosal thickening is new, but other paranasal sinuses are improved compared to 2016. Other: Trace right mastoid fluid.  Negative visible nasopharynx. IMPRESSION: 1. Intrinsic T1 hyperintensity has developed in the bilateral globus pallidus since a 2016 MRI. This is most frequently associated with Hepatic Insufficiency / Cirrhosis. Correlation with serum Ammonia levels and liver function tests is recommended. 2. No other acute intracranial abnormality. Mild generalized cerebral volume loss since 2016 with occasional chronic micro hemorrhages in the brain. 3. 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 Electronically Signed   By: Genevie Ann M.D.   On: 09/24/2020 11:34   CARDIAC CATHETERIZATION  Result Date: 09/24/2020  -----------NATIVE VESSELS-------------------------  Ost LAD to Prox LAD lesion is 95% stenosed. Prox LAD lesion is 100% stenosed with 100% stenosed side branch in Ost 1st Diag.  Ost 1st Mrg lesion is 75% stenosed.  Prox Cx to Mid Cx lesion is 100% stenosed (just after 1st Mrg) with side  branch in Ost 2nd Mrg.  Prox RCA lesion is 95% stenosed. Mid RCA to Dist RCA lesion is 100% stenosed.  ------------GRAFTS--------------------------  SVG-RCA graft was visualized by angiography and is small. Origin to Mid Graft lesion is 100% stenosed. - Previously stented Graft  SVG-1stDiag graft was visualized by angiography. Dist Graft to Insertion lesion is 55% stenosed.  LIMA-dLAD graft was visualized by angiography and is normal in caliber. The graft exhibits no disease. There is no competitive flow  ---------------------------  There is mild left ventricular systolic dysfunction. The left ventricular ejection fraction is 45-50% by visual estimate.  LV end diastolic pressure is mildly elevated.  There is a significant difference in arterial pressures versus cuff pressures-arterial pressures are at least 30- 40 mmHg higher.  SUMMARY  Severe native vessel CAD:  100% LAD following a 95% calcified lesion prior to SP1;  100% mid RCA following severe 80% stenosis,  100% AV groove LCx after OM1 with bridging collaterals to small PL branch,  75 to 80% calcified ostial OM1 (large bifurcating ramus-like vessel)  Grafts:  New lesion: 100% ostial and proximal CTO of SVG-RCA (was a very small caliber vessel previously stented.  The nature of this occlusion is such that it does not appear to be acute-the patient not actively having chest pain and no ST elevations, minimal troponin. => Likely not a reasonable target for PCI  SVG- 1st Diag has an anastomotic 50 to 60% stenosis of questionable significance (at this stage, the graft is the same caliber as a target vessel) ->  I chose not to attempt intervention on this location as she is hemodynamically clinically stable at this point. Would not want to complicate issues by potentially occluding another graft.  Patent LIMA-LAD with no left-to-right collaterals  Mildly reduced EF with inferobasal hypokinesis-45 to 50%.  Borderline elevated LVEDP of 15 mmHg   Significant discrepancy between aortic blood pressures and cuff blood pressures of at least 30 mmHg.  Cuff pressures are significantly lower. RECOMMENDATIONS  Return to CCU, would continue amiodarone load, consider lidocaine if VT recurs.  EP consultation to consider the possibility of ICD.  For future stabilization, could consider the possibility of intervention SVG-1st Diag (albeit a questionable significance), also atherectomy PCI of the OM1 (this is similar in appearance to previous cath in 2018, so this would only be to optimize distal flow - would likely need ischemic evaluation prior to proceeding with PCI on either lesion).  Prior to initiating pressor  support in the ICU, would consider invasive arterial monitoring   DG Chest Port 1 View  Result Date: 09/23/2020 CLINICAL DATA:  Ventricular tachycardia, cardiac arrest status post resuscitation EXAM: PORTABLE CHEST 1 VIEW COMPARISON:  04/10/2018 FINDINGS: Single frontal view of the chest demonstrates stable enlargement the cardiac silhouette, with postsurgical changes from median sternotomy and bypass surgery. Retrocardiac consolidation may reflect atelectasis, infection, or aspiration. Small left pleural effusion. No pneumothorax. No acute displaced fractures. Vascular stent left axillary region. IMPRESSION: 1. Left lower lobe consolidation may reflect atelectasis, infection, or aspiration. 2. Small left pleural effusion. Electronically Signed   By: Randa Ngo M.D.   On: 09/23/2020 19:21   ECHOCARDIOGRAM COMPLETE  Result Date: 09/24/2020    ECHOCARDIOGRAM REPORT   Patient Name:   Mckynzie MAE Edgar Date of Exam: 09/24/2020 Medical Rec #:  [id number]         Height:       64.0 in Accession #:    [id number]        Weight:       170.0 lb Date of Birth:  [birth year]          BSA:          1.826 m Patient Age:    16 years          BP:           84/65 mmHg Patient Gender: F                 HR:           52 bpm. Exam Location:  Inpatient Procedure: 2D  Echo Indications:    cardiac arrest  History:        Patient has prior history of Echocardiogram examinations, most                 recent 05/31/2020. CAD, Prior CABG, end stage renal disease,                 Arrythmias:ventricular tachycardia; Signs/Symptoms:Syncope.  Sonographer:    Johny Chess Referring Phys: [place]  1. Patient had VT/VF arrest within minutes of starting echocardiogram. Remainder of study completed post cath, post arrest.  2. Left ventricular ejection fraction, by estimation, is 45%. The left ventricle has mildly decreased function. The left ventricle demonstrates regional wall motion abnormalities (see scoring diagram/findings for description). Left ventricular diastolic  parameters are consistent with Grade II diastolic dysfunction (pseudonormalization).  3. Right ventricular systolic function is severely reduced. The right ventricular size is moderately enlarged. There is normal pulmonary artery systolic pressure. The estimated right ventricular systolic pressure is 84.5 mmHg.  4. Left atrial size was moderately dilated.  5. Right atrial size was moderately dilated.  6. The mitral valve is degenerative. Mild mitral valve regurgitation. No evidence of mitral stenosis.  7. The aortic valve is grossly normal. There is mild thickening of the aortic valve. Aortic valve regurgitation is not visualized. No aortic stenosis is present.  8. The inferior vena cava is normal in size with <50% respiratory variability, suggesting right atrial pressure of 8 mmHg. FINDINGS  Left Ventricle: Left ventricular ejection fraction, by estimation, is 45%. The left ventricle has mildly decreased function. The left ventricle demonstrates regional wall motion abnormalities. The left ventricular internal cavity size was normal in size. There is no left ventricular hypertrophy. Left ventricular diastolic parameters are consistent with Grade II diastolic dysfunction (pseudonormalization).   LV Wall Scoring: The basal inferolateral segment and basal  inferior segment are akinetic. Right Ventricle: The right ventricular size is moderately enlarged. Right vetricular wall thickness was not well visualized. Right ventricular systolic function is severely reduced. There is normal pulmonary artery systolic pressure. The tricuspid regurgitant velocity is 2.27 m/s, and with an assumed right atrial pressure of 8 mmHg, the estimated right ventricular systolic pressure is 93.2 mmHg. Left Atrium: Left atrial size was moderately dilated. Right Atrium: Right atrial size was moderately dilated. Pericardium: There is no evidence of pericardial effusion. Mitral Valve: The mitral valve is degenerative in appearance. Mild mitral annular calcification. Mild mitral valve regurgitation. No evidence of mitral valve stenosis. Tricuspid Valve: The tricuspid valve is normal in structure. Tricuspid valve regurgitation is mild. Aortic Valve: The aortic valve is grossly normal. There is mild thickening of the aortic valve. Aortic valve regurgitation is not visualized. No aortic stenosis is present. Pulmonic Valve: The pulmonic valve was not well visualized. Pulmonic valve regurgitation is trivial. Aorta: The aortic root is normal in size and structure. Venous: The inferior vena cava is normal in size with less than 50% respiratory variability, suggesting right atrial pressure of 8 mmHg. IAS/Shunts: No atrial level shunt detected by color flow Doppler.  LEFT VENTRICLE PLAX 2D LVIDd:         4.40 cm Diastology LVIDs:         3.60 cm LV e' medial:    5.77 cm/s LV PW:         0.90 cm LV E/e' medial:  24.4 LV IVS:        0.70 cm LV e' lateral:   7.51 cm/s                        LV E/e' lateral: 18.8  RIGHT VENTRICLE            IVC RV S prime:     4.35 cm/s  IVC diam: 1.80 cm LEFT ATRIUM             Index       RIGHT ATRIUM           Index LA diam:        3.60 cm 1.97 cm/m  RA Area:     16.20 cm LA Vol (A2C):   71.5 ml 39.16 ml/m RA  Volume:   41.10 ml  22.51 ml/m LA Vol (A4C):   34.5 ml 18.90 ml/m LA Biplane Vol: 51.6 ml 28.26 ml/m  AORTIC VALVE LVOT Vmax:   84.90 cm/s LVOT Vmean:  57.800 cm/s LVOT VTI:    0.221 m  AORTA Ao Asc diam: 2.30 cm MITRAL VALVE                TRICUSPID VALVE MV Area (PHT): 2.87 cm     TR Peak grad:   20.6 mmHg MV Decel Time: 264 msec     TR Vmax:        227.00 cm/s MV E velocity: 141.00 cm/s MV A velocity: 80.80 cm/s   SHUNTS MV E/A ratio:  1.75         Systemic VTI: 0.22 m Cherlynn Kaiser MD Electronically signed by Cherlynn Kaiser MD Signature Date/Time: 09/24/2020/4:44:35 PM    Final     Lab Data:  CBC: Recent Labs  Lab 09/23/20 1937 09/24/20 0315 09/25/20 0424 09/26/20 1250  WBC 5.1 6.1 8.1 7.5  NEUTROABS 4.1  --   --   --   HGB 11.0* 11.0* 10.3* 10.2*  HCT 34.5*  34.2* 33.7* 33.2*  MCV 106.2* 105.9* 109.1* 107.8*  PLT 129* 123* 129* 333*   Basic Metabolic Panel: Recent Labs  Lab 09/23/20 1937 09/24/20 0315 09/24/20 1059 09/25/20 0424 09/26/20 0513 09/27/20 0110 09/28/20 0358  NA 137  --  139 133* 133* 136 135  K 3.3*  --  3.7 4.6 4.5 3.8 3.8  CL 93*  --  94* 90* 88* 96* 94*  CO2 34*  --  34* 30 31 28 29   GLUCOSE 200*  --  157* 194* 178* 170* 166*  BUN 7*  --  12 19 29* 15 33*  CREATININE 2.74*   < > 3.62* 4.46* 5.62* 3.48* 5.34*  CALCIUM 7.7*  --  8.1* 8.5* 8.7* 8.6* 8.4*  MG 1.7  --  1.8 2.7*  --  2.1  --   PHOS 2.2*  --   --   --  4.8*  --   --    < > = values in this interval not displayed.   GFR: Estimated Creatinine Clearance: 8.6 mL/min (A) (by C-G formula based on SCr of 5.34 mg/dL (H)). Liver Function Tests: Recent Labs  Lab 09/23/20 1937 09/26/20 0513 09/27/20 0110 09/28/20 0358  AST 43*  --  139* 53*  ALT 27  --  143* 107*  ALKPHOS 75  --  66 70  BILITOT 0.6  --  0.7 0.7  PROT 6.2*  --  5.7* 5.4*  ALBUMIN 3.0* 2.6* 2.5* 2.5*   No results for input(s): LIPASE, AMYLASE in the last 168 hours. Recent Labs  Lab 09/26/20 0513  AMMONIA 36*    Coagulation Profile: Recent Labs  Lab 09/23/20 1937  INR 1.1   Cardiac Enzymes: No results for input(s): CKTOTAL, CKMB, CKMBINDEX, TROPONINI in the last 168 hours. BNP (last 3 results) No results for input(s): PROBNP in the last 8760 hours. HbA1C: No results for input(s): HGBA1C in the last 72 hours. CBG: Recent Labs  Lab 09/27/20 1140 09/27/20 1614 09/27/20 2121 09/28/20 0635 09/28/20 0815  GLUCAP 170* 177* 201* 145* 159*   Lipid Profile: No results for input(s): CHOL, HDL, LDLCALC, TRIG, CHOLHDL, LDLDIRECT in the last 72 hours. Thyroid Function Tests: No results for input(s): TSH, T4TOTAL, FREET4, T3FREE, THYROIDAB in the last 72 hours. Anemia Panel: No results for input(s): VITAMINB12, FOLATE, FERRITIN, TIBC, IRON, RETICCTPCT in the last 72 hours. Urine analysis:    Component Value Date/Time   COLORURINE YELLOW 12/21/2015 2222   APPEARANCEUR CLEAR 12/21/2015 2222   LABSPEC 1.013 12/21/2015 2222   PHURINE 6.5 12/21/2015 2222   GLUCOSEU NEGATIVE 12/21/2015 2222   HGBUR MODERATE (A) 12/21/2015 2222   BILIRUBINUR NEGATIVE 12/21/2015 2222   KETONESUR NEGATIVE 12/21/2015 2222   PROTEINUR >300 (A) 12/21/2015 2222   UROBILINOGEN 0.2 04/19/2015 1248   NITRITE POSITIVE (A) 12/21/2015 2222   LEUKOCYTESUR TRACE (A) 12/21/2015 2222     Boden Stucky M.D. [facility] 09/28/2020, 11:02 AM  Available via Epic secure chat 7am-7pm After 7 pm, please refer to night coverage provider listed on amion.

## 2020-09-28 NOTE — Plan of Care (Signed)
  Problem: Clinical Measurements: Goal: Ability to maintain clinical measurements within normal limits will improve Outcome: Progressing Goal: Will remain free from infection Outcome: Progressing Goal: Diagnostic test results will improve Outcome: Progressing Goal: Respiratory complications will improve Outcome: Progressing Goal: Cardiovascular complication will be avoided Outcome: Progressing   Problem: Coping: Goal: Level of anxiety will decrease Outcome: Progressing   Problem: Elimination: Goal: Will not experience complications related to urinary retention Outcome: Progressing   Problem: Safety: Goal: Ability to remain free from injury will improve Outcome: Progressing   Problem: Skin Integrity: Goal: Risk for impaired skin integrity will decrease Outcome: Progressing   Problem: Clinical Measurements: Goal: Will remain free from infection Outcome: Progressing   Problem: Education: Goal: Knowledge of General Education information will improve Description: Including pain rating scale, medication(s)/side effects and non-pharmacologic comfort measures Outcome: Not Progressing   Problem: Health Behavior/Discharge Planning: Goal: Ability to manage health-related needs will improve Outcome: Not Progressing   Problem: Activity: Goal: Risk for activity intolerance will decrease Outcome: Not Progressing   Problem: Nutrition: Goal: Adequate nutrition will be maintained Outcome: Not Progressing   Problem: Elimination: Goal: Will not experience complications related to bowel motility Outcome: Not Progressing   Problem: Pain Managment: Goal: General experience of comfort will improve Outcome: Not Progressing

## 2020-09-29 ENCOUNTER — Encounter (HOSPITAL_COMMUNITY): Payer: Medicare Other

## 2020-09-29 DIAGNOSIS — R55 Syncope and collapse: Secondary | ICD-10-CM | POA: Diagnosis not present

## 2020-09-29 DIAGNOSIS — R54 Age-related physical debility: Secondary | ICD-10-CM

## 2020-09-29 DIAGNOSIS — I2510 Atherosclerotic heart disease of native coronary artery without angina pectoris: Secondary | ICD-10-CM | POA: Diagnosis not present

## 2020-09-29 DIAGNOSIS — R5381 Other malaise: Secondary | ICD-10-CM

## 2020-09-29 DIAGNOSIS — N186 End stage renal disease: Secondary | ICD-10-CM | POA: Diagnosis not present

## 2020-09-29 DIAGNOSIS — I248 Other forms of acute ischemic heart disease: Secondary | ICD-10-CM | POA: Diagnosis not present

## 2020-09-29 DIAGNOSIS — I25711 Atherosclerosis of autologous vein coronary artery bypass graft(s) with angina pectoris with documented spasm: Secondary | ICD-10-CM

## 2020-09-29 DIAGNOSIS — D638 Anemia in other chronic diseases classified elsewhere: Secondary | ICD-10-CM | POA: Diagnosis not present

## 2020-09-29 DIAGNOSIS — I472 Ventricular tachycardia: Secondary | ICD-10-CM | POA: Diagnosis not present

## 2020-09-29 DIAGNOSIS — R531 Weakness: Secondary | ICD-10-CM

## 2020-09-29 LAB — GLUCOSE, CAPILLARY
Glucose-Capillary: 206 mg/dL — ABNORMAL HIGH (ref 70–99)
Glucose-Capillary: 201 mg/dL — ABNORMAL HIGH (ref 70–99)
Glucose-Capillary: 218 mg/dL — ABNORMAL HIGH (ref 70–99)
Glucose-Capillary: 237 mg/dL — ABNORMAL HIGH (ref 70–99)
Glucose-Capillary: 211 mg/dL — ABNORMAL HIGH (ref 70–99)
Glucose-Capillary: 203 mg/dL — ABNORMAL HIGH (ref 70–99)

## 2020-09-29 MED ORDER — WITCH HAZEL-GLYCERIN EX PADS: MEDICATED_PAD | CUTANEOUS | Status: DC | PRN

## 2020-09-29 MED ORDER — HYDROCORTISONE ACETATE 25 MG RE SUPP
25.0000 mg | Freq: Two times a day (BID) | RECTAL | Status: DC
  Administered 2020-09-29 – 2020-10-10 (×18): 25 mg via RECTAL
  Filled 2020-09-29 (×27): qty 1

## 2020-09-29 NOTE — Progress Notes (Signed)
Notified on call [facility] for patient not unable to take amio and renavit d/t lethargy. Will continue to monitor

## 2020-09-29 NOTE — Progress Notes (Signed)
[facility] Progress Note   Assessment/ Plan:    Dialysis [facility] Tuesday Thursday Saturday Time:4 hrsEDW 76.5 K 3 Ca 2.5  Micera 100 q 2 weeks  1. Vtach/ CAD s/p CABG: cath 09/25/31 with severe native CAD with 100% occlusion multiple vessels.  Grafts with new 100% occlusion 100% ostial and proximal CTO of SVG-RCA.  On amio for Vtach.   2.ESRD TTS-- will write orders for tomorrow 3. Anemia: ESA as appropriate 4. CKD-MBD: binders and vits 5. Nutrition: protein supps 6. Hypotension: cuff and aline pressures dont' correlate--> using clinical markers of perfusion as well  Subjective:    Had HD yesterday.  A-line removed and cuff and art line pressures do not correlate well at all.  Plan will be to attempt HD tomorrow and if intolerant of it will consider GOC and transition to comfort.   Objective:   BP (!) 83/71   Pulse 72   Temp (!) 101.4 F (38.6 C) (Axillary)   Resp 18   Ht 5\' 4"  (1.626 m)   Wt 79.1 kg   SpO2 100%   BMI 29.93 kg/m   Physical Exam: Gen: NAD sitting in bed CVS: tachycardic Resp: RRR Abd: soft Ext: no LE edema ACCESS: R AVF  Labs: BMET Recent Labs  Lab 09/23/20 1937 09/24/20 0315 09/24/20 1059 09/25/20 0424 09/26/20 0513 09/27/20 0110 09/28/20 0358  NA 137  --  139 133* 133* 136 135  K 3.3*  --  3.7 4.6 4.5 3.8 3.8  CL 93*  --  94* 90* 88* 96* 94*  CO2 34*  --  34* 30 31 28 29   GLUCOSE 200*  --  157* 194* 178* 170* 166*  BUN 7*  --  12 19 29* 15 33*  CREATININE 2.74* 3.09* 3.62* 4.46* 5.62* 3.48* 5.34*  CALCIUM 7.7*  --  8.1* 8.5* 8.7* 8.6* 8.4*  PHOS 2.2*  --   --   --  4.8*  --   --    CBC Recent Labs  Lab 09/23/20 1937 09/24/20 0315 09/25/20 0424 09/26/20 1250  WBC 5.1 6.1 8.1 7.5  NEUTROABS 4.1  --   --   --   HGB 11.0* 11.0* 10.3* 10.2*  HCT 34.5* 34.2* 33.7* 33.2*  MCV 106.2* 105.9* 109.1* 107.8*  PLT 129* 123* 129* 124*      Medications:    . allopurinol  100 mg Oral q AM   . amiodarone  200 mg Oral BID  . brimonidine  1 drop Right Eye QHS   And  . brimonidine  2 drop Left Eye QHS  . calcium carbonate  1 tablet Oral Q lunch  . Chlorhexidine Gluconate Cloth  6 each Topical Daily  . Chlorhexidine Gluconate Cloth  6 each Topical Q0600  . Chlorhexidine Gluconate Cloth  6 each Topical Q0600  . cinacalcet  30 mg Oral Once per day on Tue Thu  . clopidogrel  75 mg Oral q AM  . feeding supplement (NEPRO CARB STEADY)  237 mL Oral TID BM  . insulin aspart  0-5 Units Subcutaneous QHS  . insulin aspart  0-6 Units Subcutaneous TID WC  . latanoprost  1 drop Both Eyes QHS  . midodrine  5 mg Oral TID WC  . multivitamin  1 tablet Oral QHS  . polycarbophil  625 mg Oral BID WC  . sevelamer carbonate  1,600 mg Oral BID WC  . sodium chloride flush  3 mL Intravenous Q12H  . sodium chloride  flush  3 mL Intravenous Q12H  . timolol  1 drop Left Eye QHS    Madelon Lips, MD 09/29/2020, 10:23 AM

## 2020-09-29 NOTE — Progress Notes (Signed)
Progress Note  Patient Name: [name] Date of Encounter: 09/29/2020  Primary Cardiologist: Larae Grooms, MD   Subjective   Had dialysis last night.  After arterial line was pulled, had difficulty monitoring blood pressures.  Like blood pressure reading in the 50s and 60s. (In the Cath Lab, her pressures were at least 30 to 40 mm higher by arterial line cuff pressures-she appears to be perfusing, warm to touch) unfortunately, were not able to monitor her blood pressures without the arterial line.  This morning she is a little bit more somnolent, but per nursing staff is arousable and takes pills.  Inpatient Medications    Scheduled Meds: . allopurinol  100 mg Oral q AM  . amiodarone  200 mg Oral BID  . brimonidine  1 drop Right Eye QHS   And  . brimonidine  2 drop Left Eye QHS  . calcium carbonate  1 tablet Oral Q lunch  . Chlorhexidine Gluconate Cloth  6 each Topical Daily  . Chlorhexidine Gluconate Cloth  6 each Topical Q0600  . Chlorhexidine Gluconate Cloth  6 each Topical Q0600  . cinacalcet  30 mg Oral Once per day on Tue Thu  . clopidogrel  75 mg Oral q AM  . feeding supplement (NEPRO CARB STEADY)  237 mL Oral TID BM  . insulin aspart  0-5 Units Subcutaneous QHS  . insulin aspart  0-6 Units Subcutaneous TID WC  . latanoprost  1 drop Both Eyes QHS  . midodrine  5 mg Oral TID WC  . multivitamin  1 tablet Oral QHS  . polycarbophil  625 mg Oral BID WC  . sevelamer carbonate  1,600 mg Oral BID WC  . sodium chloride flush  3 mL Intravenous Q12H  . sodium chloride flush  3 mL Intravenous Q12H  . timolol  1 drop Left Eye QHS   Continuous Infusions: . sodium chloride    . sodium chloride    . sodium chloride    . sodium chloride     PRN Meds: sodium chloride, sodium chloride, sodium chloride, acetaminophen, alteplase, famotidine, heparin, lidocaine (PF), lidocaine-prilocaine, morphine injection, nitroGLYCERIN, oxyCODONE-acetaminophen,  pentafluoroprop-tetrafluoroeth, polyvinyl alcohol, sodium chloride flush   Vital Signs    Vitals:   09/29/20 0500 09/29/20 0600 09/29/20 0700 09/29/20 0704  BP: (!) 74/38 (!) 103/51 (!) 48/37 (!) 83/71  Pulse: 72 73 72 72  Resp: 19 (!) _0 Temp:    (!) 101.4 F (38.6 C)  TempSrc:    Axillary  SpO2: 100% 100% 100% 100%  Weight:      Height:        Intake/Output Summary (Last 24 hours) at 09/29/2020 0844 Last data filed at 09/29/2020 0600 Gross per 24 hour  Intake 120 ml  Output 1500 ml  Net -1380 ml   Filed Weights   09/28/20 1305 09/28/20 1657 09/29/20 0430  Weight: 79.2 kg 78 kg 79.1 kg    Telemetry    Heart rates now in the 60s to 70s.  Stable.- Personally Reviewed  ECG    No new study.  Personally Reviewed  Physical Exam   GEN:  More lethargic this morning.  Probably still sleeping, but apparently was arousable to the nurse. Neck:  No JVD Cardiac:  RRR normal S1-S2.  Distant heart sounds.  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 Respiratory:   weak respiratory effort.  Mildly diminished basal breast fields. GI:  Soft/NT/ND/decreased bowel sounds. MS:  Mild puffy edema  but no true edema. Neuro:   Minimally responsive this morning.  Opens eyes and acknowledges, but does not speak.  Was wide-awake yesterday.  Labs    Chemistry Recent Labs  Lab 09/23/20 1937 09/24/20 0315 09/26/20 0513 09/27/20 0110 09/28/20 0358  NA 137   < > 133* 136 135  K 3.3*   < > 4.5 3.8 3.8  CL 93*   < > 88* 96* 94*  CO2 34*   < > _0 GLUCOSE 200*   < > 178* 170* 166*  BUN 7*   < > 29* 15 33*  CREATININE 2.74*   < > 5.62* 3.48* 5.34*  CALCIUM 7.7*   < > 8.7* 8.6* 8.4*  PROT 6.2*  --   --  5.7* 5.4*  ALBUMIN 3.0*  --  2.6* 2.5* 2.5*  AST 43*  --   --  139* 53*  ALT 27  --   --  143* 107*  ALKPHOS 75  --   --  66 70  BILITOT 0.6  --   --  0.7 0.7  GFRNONAA 17*   < > 7* 13* 8*  ANIONGAP 10   < > _1 < > = values in this interval  not displayed.     Hematology Recent Labs  Lab 09/24/20 0315 09/25/20 0424 09/26/20 1250  WBC 6.1 8.1 7.5  RBC 3.23* 3.09* 3.08*  HGB 11.0* 10.3* 10.2*  HCT 34.2* 33.7* 33.2*  MCV 105.9* 109.1* 107.8*  MCH 34.1* 33.3 33.1  MCHC 32.2 30.6 30.7  RDW 15.1 15.8* 15.9*  PLT 123* 129* 124*    Cardiac EnzymesNo results for input(s): TROPONINI in the last 168 hours. No results for input(s): TROPIPOC in the last 168 hours.   BNPNo results for input(s): BNP, PROBNP in the last 168 hours.   DDimer No results for input(s): DDIMER in the last 168 hours.   Radiology    No results found.  Cardiac Studies    Echo 09/24/2020 summary: EF 45%.  Basal inferolateral and basal inferior akinesis (consistent with occluded SVG-RCA).  Moderate enlarged RV with severely dysfunction.  Moderate biatrial enlargement.  MAC noted but no stenosis and mild MR.  Mild aortic sclerosis but no stenosis.   Cardiac cath 09/24/2020:  Severe native vessel CAD: 100% LAD following a 95% calcified lesion prior to SP1; 100% mid RCA following severe 80% stenosis, 100% AV groove LCx after OM1 with bridging collaterals to small PL branch, 75 to 80% calcified ostial OM1 (large bifurcating ramus-like vessel)  Grafts: NEW LESION: 100% ostial and proximal CTO of SVG-RCA (was a very small caliber vessel previously stented.  The nature of this occlusion is such that it does not appear to be acute-the patient not actively having chest pain and no ST elevations, minimal troponin. => Likely not a reasonable target for PCI ? SVG- 1st Diag has an anastomotic 50 to 60% stenosis of questionable significance (at this stage, the graft is the same caliber as a target vessel) ->   I chose not to attempt intervention on this location as she is hemodynamically clinically stable at this point. Would not want to complicate issues by potentially occluding another graft. ? Patent LIMA-LAD with no left-to-right collaterals  Mildly reduced EF  with inferobasal hypokinesis-45 to 50%.  Borderline elevated LVEDP of 15 mmHg  Significant discrepancy between aortic blood pressures and cuff blood pressures of at least 30 mmHg.  Cuff pressures are significantly lower.  Patient Profile     68F with CAD s/p CABG x3 (LIMA-LAD, SVG-diag, SVG-RCA), prior VF arrest in the setting of SVG occlusion , ESRD on iHD, post op AF, TIA, CAS s/p L CAE (2009), MDD, DM2, diverticulosis, GERD, glaucoma, and HTN who presents following recurrent LOC and subsequent witnessed arrest.  Recurrent VT on 09/24/2020-resuscitated with 90 seconds of CPR, no shock.  Amiodarone drip continued along with lidocaine.  Cardiac cath revealed likely subacute occlusion of SVG-RCA.  Not PCI target.  No other clear targets to explain ischemic VT.  Assessment & Plan   Principal Problem:   Ventricular tachycardia (HCC) Active Problems:   Paroxysmal atrial fibrillation (HCC)   Coronary artery disease involving native heart without angina pectoris   Coronary artery disease involving autologous vein bypass graft   Demand ischemia of myocardium (HCC)   Hyperlipidemia associated with type 2 diabetes mellitus ([place])   Essential hypertension   Hypokalemia   QT prolongation   ESRD on dialysis ([place])   Anemia of chronic disease   Diabetes mellitus type 2 in nonobese (HCC)   Syncope and collapse   Pressure injury of skin   - CAD s/p CABG with prior VT/VF arrest with cath showing occluded SVG requiring PCI in 2019.  Echo with mildly reduced EF of 45%.  Severely reduced RV function.  Moderate biatrial enlargement.  Mildly elevated right atrial pressures.  EMS notes indicate WCT rate 200, terminating prior to defibrillation. She did require CPR.  Recurrent event on 09/24/2020 roughly 11 AM short V. tach degenerating to VF/pulseless VT-90 minutes of CPR with no defibrillation.  Restored ROSC.  No angina. Urgent cath with newly occluded SVG-RCA but otherwise stable.  Principal  Problem:   Ventricular tachycardia ([place]) as etiology for Syncope and collapse - concern for Ischemic VT -   Has been tolerating oral amiodarone.  No further episodes.  At this point she is DNR/DNI-no CPR or escalation in meds.  Demand ischemia: Never had chest pain in the initial presentation.  Current chest pain is not anginal it is musculoskeletal.  Troponin elevation probably related to CPR and prolonged arrest.  Pancreatic lesion likely not the acute event.  Active Problems:      Coronary artery disease involving native heart without angina pectoris /   Coronary artery disease involving autologous vein bypass graft  Continue Plavix but hold aspirin and statin.  No beta-blocker, continue amiodarone.     Hyperlipidemia associated with type 2 diabetes mellitus ([place]) -Per TRH, on standing insulin with sliding scale.   Statin stopped pending recovery.    Essential hypertension -not on any antihypertensives, blood pressure stable. Blood pressures been stable for the last several days, but but once we pulled the arterial line, and lost ability to monitor pressures.  Unfortunately, she cannot have an arterial line and the indwelling catheter for prolonged periods of time.  At this point we need to look for other clinical signs besides the blood pressure -> cuff pressures on the legs are not accurate based on measurement of A-line versus cuff pressures in the past.  Usually at least 30 if not 40 mmHg different.  The dilemma here is how to monitor going forward.  Clearly we cannot keep an arterial line in her and have progression of care.  We simply need to go on clinical features.  She was able to make it through dialysis which would indicate that she had enough pressure to perfuse the pump. -> If she were not able to make  it to dialysis based on low blood pressures, then the issue of pressor support would need to be addressed versus deciding to withhold dialysis.       ESRD on dialysis  (HCC)/ Hypokalemia    Successful dialysis yesterday, but had issues with blood pressures following dialysis.  See above.  Need to determine if she will truly be able to make it through dialysis going forward.  If not, then this would expedite decision about continuing toward comfort as opposed to palliative care.     Paroxysmal atrial fibrillation (HCC) -was postop.  No recurrence seen on monitor.  Not on DOAC.   Anemia of chronic disease -> stable   Diabetes mellitus type 2 in nonobese (HCC)  Stable transfer to floor. ->  They need to understand that vital signs will be difficult.  Her cuff pressures are significantly lower than A-line pressures.  Cannot go to floor with A-line in place.  Continues to be DNR-DNI; blood pressure monitoring is an issue.  At this point my recommendation would be to minimize of blood pressure monitoring and go up more on clinical fuse.  If she is not responding and able to make it through dialysis, then clearly she is declaring herself as failure to thrive in which case further pursuit of dialysis would be unnecessary.  In that case moving toward comfort care would be warranted.  She does appear to be getting progressively weaker,  however, as of yesterday she was taking in some p.o.  Here again if she is not able to take p.o., then moving toward more comfort as opposed to palliative care is reasonable.  There are no further invasive options from a cardiac standpoint.  At this point the major "life-saving "support is the intermittent dialysis.  I have had multiple talks with the family about goals of care, but the ones taking 1 has been whether or not to continue dialysis.  Palliative care team is on board.  I will be happy to try to be present during family talks if necessary, but it makes more sense to have this done as a unified talk with input from all parties involved.   For questions or updates, please contact [place] [place] Please consult [email or web address] for  contact info under        Signed, [name], MD  09/29/2020, 8:44 AM

## 2020-09-29 NOTE — Progress Notes (Signed)
Daily Progress Note   Patient Name: [name]       Date: 09/29/2020 DOB: 1941/06/10  Age: 80 y.o. MRN#: [id number] Attending Physician: Mendel Corning, MD Primary Care Physician: Josetta Huddle, MD Admit Date: 09/23/2020  Reason for Consultation/Follow-up: Establishing goals of care  Subjective: Chart review performed. Received report from primary RN - no acute concerns; however, she states patient seems more withdrawn and fatigued today with flat affect and decreased appetite. RN reports patient has had small amount of ice cream and applesauce only.  Went to visit patient at bedside - daughter/Mimmi was present. Patient had just arrived to 4E from [facility] and was receiving a bath from nursing staff. Spoke with daughter in the hallway per her request and was joined by granddaughter/Tiffany. Family are aware of issues monitoring patient's blood pressure now that a-line has been removed. We discussed that if these are true readings, the issues of low blood pressure in context of receiving HD. Reviewed that the current plan is to proceed with HD tomorrow and if patient cannot tolerate, transition to comfort would be recommended at that time. Family expressed understanding but remain hopeful. We also reviewed the patient's current poor oral intake and how it is likely not enough to sustain her long term. We discussed that regardless of how dialysis treatment goes tomorrow, her decreased oral intake is a poor prognostic indicator. We discussed evidence has shown that PEG tubes in patients do not increase survival, prevent aspiration, or improve wound healing. They can promote isolation and use of restraints leading to increased potential for pressure ulcers. Family express they would not be interested in  pursuing PEG. Natural trajectory and expectations at EOL were reviewed - including lethargy and decreased appetite/oral intake - we discussed in detail that the patient is not "starving" due to decreased PO intake; but rather, as the body slows down approaching EOL, people are simply not hungry. We also discussed that if they decided to stop dialysis, stopping dialysis would ultimately not be what takes the patient's life, but rather the kidney disease itself - we reviewed that without dialysis she would have passed away many years ago and dialysis has simply been a life prolonging measure. Family seem to find comfort in discussion today. Tiffany does express she feels the patient is suffering and by continuing  our current full scope treatment we are prolonging the patient's life without providing quality. The patient's daughter/Mimmi seems to be having a hard time grasping that the patient is likely approaching EOL. She states as long as the patient is eating, even if minimal amounts, she wants the patient to continue dialysis if able; however, by the end of discussion today she seems to understand that life prolonging measures do not mean we are providing a good quality of life. Family would like patient to receive dialysis tomorrow 3/19 and see if she is able to tolerate. Family are open for PMT to follow up after patient receives dialysis tomorrow to continue [place].   "Hard Choices" book was provided, with special note to family on section on dialysis.   All questions and concerns addressed. Encouraged to call with questions and/or concerns. PMT card provided.   Length of Stay: 6  Current Medications: Scheduled Meds:  . allopurinol  100 mg Oral q AM  . amiodarone  200 mg Oral BID  . brimonidine  1 drop Right Eye QHS   And  . brimonidine  2 drop Left Eye QHS  . calcium carbonate  1 tablet Oral Q lunch  . Chlorhexidine Gluconate Cloth  6 each Topical Daily  . Chlorhexidine Gluconate Cloth  6 each  Topical Q0600  . Chlorhexidine Gluconate Cloth  6 each Topical Q0600  . cinacalcet  30 mg Oral Once per day on Tue Thu  . clopidogrel  75 mg Oral q AM  . feeding supplement (NEPRO CARB STEADY)  237 mL Oral TID BM  . insulin aspart  0-5 Units Subcutaneous QHS  . insulin aspart  0-6 Units Subcutaneous TID WC  . latanoprost  1 drop Both Eyes QHS  . midodrine  5 mg Oral TID WC  . multivitamin  1 tablet Oral QHS  . polycarbophil  625 mg Oral BID WC  . sevelamer carbonate  1,600 mg Oral BID WC  . sodium chloride flush  3 mL Intravenous Q12H  . sodium chloride flush  3 mL Intravenous Q12H  . timolol  1 drop Left Eye QHS    Continuous Infusions: . sodium chloride    . sodium chloride    . sodium chloride    . sodium chloride      PRN Meds: sodium chloride, sodium chloride, sodium chloride, acetaminophen, alteplase, famotidine, heparin, lidocaine (PF), lidocaine-prilocaine, morphine injection, nitroGLYCERIN, oxyCODONE-acetaminophen, pentafluoroprop-tetrafluoroeth, polyvinyl alcohol, sodium chloride flush            Vital Signs: BP 98/89 (BP Location: Right Leg)   Pulse 66   Temp (!) 100.4 F (38 C) (Axillary)   Resp 17   Ht 5\' 4"  (1.626 m)   Wt 79.1 kg   SpO2 100%   BMI 29.93 kg/m  SpO2: SpO2: 100 % O2 Device: O2 Device: Nasal Cannula O2 Flow Rate: O2 Flow Rate (L/min): 3 L/min  Intake/output summary:   Intake/Output Summary (Last 24 hours) at 09/29/2020 1708 Last data filed at 09/29/2020 1600 Gross per 24 hour  Intake 480 ml  Output --  Net 480 ml   LBM: Last BM Date: 09/29/20 Baseline Weight: Weight: 77.1 kg Most recent weight: Weight: 79.1 kg       Palliative Assessment/Data: PPS 20%      Patient Active Problem List   Diagnosis Date Noted  . Pressure injury of skin 09/28/2020  . Coronary artery disease involving autologous vein bypass graft 09/25/2020  . Demand ischemia of myocardium ([place]) 09/25/2020  .  Syncope and collapse 09/23/2020  . Body mass index  (BMI) 32.0-32.9, adult 08/01/2020  . Cervical disc disorder 08/01/2020  . Cough variant asthma 08/01/2020  . Dependence on renal dialysis ([place]) 08/01/2020  . Diabetic renal disease ([place]) 08/01/2020  . Diabetic retinopathy associated with type 2 diabetes mellitus ([place]) 08/01/2020  . Edema 08/01/2020  . Encounter for general adult medical examination without abnormal findings 08/01/2020  . Gout 08/01/2020  . Hereditary and idiopathic neuropathy, unspecified 08/01/2020  . Hyperglycemia due to type 2 diabetes mellitus ([place]) 08/01/2020  . Joint pain 08/01/2020  . 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 Osteoarthritis of knee 08/01/2020  . Other long term (current) drug therapy 08/01/2020  . Other polyosteoarthritis 08/01/2020  . Pain in unspecified knee 08/01/2020  . Proteinuria 08/01/2020  . Pure hypercholesterolemia 08/01/2020  . Pure hypertriglyceridemia 08/01/2020  . Urinary tract infectious disease 08/01/2020  . Vitamin D deficiency 08/01/2020  . Vomiting 08/01/2020  . Other disorders of phosphorus metabolism 01/11/2020  . Encounter for removal of sutures 12/04/2019  . Shortness of breath 07/29/2019  . Anaphylactic shock, unspecified, sequela 04/23/2019  . Secondary hyperparathyroidism of renal origin ([place]) 09/21/2018  . Iron deficiency anemia, unspecified 06/08/2018  . Hemorrhoids   . Diabetes mellitus type 2 in nonobese (HCC)   . Anemia, chronic disease   . Labile blood pressure   . Poorly controlled type 2 diabetes mellitus with peripheral neuropathy ([place])   . Debility   . Benign essential HTN   . Bradycardia   . Ventricular tachycardia ([place])   . Enteritis due to Clostridium difficile   . ESRD on dialysis ([place])   . Diabetic peripheral neuropathy ([place])   . Acute blood loss anemia   . Anemia of chronic disease   . Aspiration into airway   . NSTEMI (non-ST elevated myocardial infarction) ([place])   . QT prolongation 04/03/2018  . Acute respiratory failure with hypoxia  ([place])   . Aspiration pneumonia due to gastric secretions ([place])   . Cardiac arrest ([place]) 04/02/2018  . Mild protein-calorie malnutrition ([place]) 03/13/2018  . Anxiety 05/13/2017  . Panic attack 05/13/2017  . SAH (subarachnoid hemorrhage) ([place]) 02/03/2017  . Fall 02/03/2017  . Spinal stenosis, lumbar region 03/02/2016  . Trochanteric bursitis of left hip 01/03/2016  . Generalized weakness 12/21/2015  . Hypokalemia 12/21/2015  . Lumbar back pain with radiculopathy affecting left lower extremity 12/04/2015  . Coronary artery disease involving native coronary artery of native heart without angina pectoris   . CRI (chronic renal insufficiency), stage 4 (severe) (HCC)   . Urinary retention   . Anemia of chronic kidney failure   . Hx of gout   . Thrombocytopenia ([place])   . S/P lumbar discectomy 12/01/2015  . 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 Acute encephalopathy   . Chronic kidney disease (CKD), stage V ([place]) 07/13/2015  . Glaucoma   . Paroxysmal atrial fibrillation (HCC)   . Coronary artery disease involving native heart without angina pectoris   . Depression   . Stroke ([place])   . Diabetes mellitus without complication ([place])   . Gastroesophageal reflux disease with esophagitis   . Carotid artery stenosis 04/21/2015  . Essential hypertension 06/08/2014  . Hyperlipidemia associated with type 2 diabetes mellitus ([place]) 06/08/2014  . Occlusion and stenosis of carotid artery without mention of cerebral infarction 01/01/2012    Palliative Care Assessment & Plan   Patient Profile: 80 y.o.femalewith past medical history  of ESRD on dialysis TTS, coronary artery disease s/p CABG, carotid stenosis, atrial fibrillation, previous history of non-sustained v-tach, DM2, HTN, HLD, and hearing loss presenting to the emergency departmenton3/12/2022with multiple episodes of syncope.She reportedly had an episode right  after dialysis. Had another episode at home, became unresponsive, and family started CPR. When EMS arrived, monitor showed nonsustained v-tach bigeminy, lidocaine infusion was started.  ED Course: On arrival to ED patient was back in sinus rhythm. Chest x-ray showed left lower lobe consolidation probably atelectasis versus infection or aspiration. She was admitted to [facility] for further evaluation of syncopal episodes.   On 3/13, patient was undergoing echocardiogram and developed symptomatic v-tach requiring CPR. She underwent emergent cardiac cath with findings of new 100% stenosis in multiple vessels. Patient was seen by EP, but is not a candidate for ICD.  Assessment: Syncopal episodes with collapse Symptomatic ventricular tachycardia Paroxysmal atrial fibrillation Coronary artery disease DM with nephropathy Essential hypertension ESRD on HD Anemia of chronic disease Generalized debility Deconditioned state Failure to thrive  Recommendations/Plan:  Continue current full scope medical treatment  Continue DNR/DNI as previously documented  Family understand there are current issues monitoring patient's BP now that a-line has been removed. They are agreeable for patient to receive dialysis tomorrow 3/19 and assess for tolerance. They understand if patient does not tolerate dialysis, recommendation will be given for comfort measures  Family understand importance of adequate nutritional needs in context of positive rehabilitation experience/strength building, wound healing, and overall needs to sustain long term. Family also understands importance of tolerating PO intake in regards to medication management if focus is not on comfort  Family are clear they would not wish to pursue PEG tube  Patient is likely approaching EOL and would be hospice appropriate due to continued minimal PO intake/failure to thrive - anticipate patient will continue to decline  Ongoing [place] discussions pending  clinical course - PMT will follow up with family tomorrow after patient receives/attempts dialysis  PMT will continue to follow and support holistically  Goals of Care and Additional Recommendations:  Limitations on Scope of Treatment: Full Scope Treatment, No Artificial Feeding and No Tracheostomy  Code Status:    Code Status Orders  (From admission, onward)         Start     Ordered   09/25/20 1446  Do not attempt resuscitation (DNR)  Continuous       Question Answer Comment  In the event of cardiac or respiratory ARREST Do not call a "code blue"   In the event of cardiac or respiratory ARREST Do not perform Intubation, CPR, defibrillation or ACLS   In the event of cardiac or respiratory ARREST Use medication by any route, position, wound care, and other measures to relive pain and suffering. May use oxygen, suction and manual treatment of airway obstruction as needed for comfort.      09/25/20 1445        Code Status History    Date Active Date Inactive Code Status Order ID Comments User Context   09/25/2020 1258 09/25/2020 1445 Partial Code [id number]  Vickii Penna, RN Inpatient   09/24/2020 0155 09/25/2020 1258 Full Code [id number]  Elwyn Reach, MD Inpatient   04/16/2018 1024 04/28/2018 1821 Partial Code [id number]  Roney Jaffe, MD Inpatient   04/15/2018 1620 04/15/2018 1621 DNR [id number]  Cathlyn Parsons, PA-C Inpatient   04/15/2018 1620 04/15/2018 1620 Full Code [id number]  Cathlyn Parsons, PA-C Inpatient   04/13/2018 1711 04/15/2018 1542 DNR  [id number]  Roney Jaffe, MD Inpatient   04/02/2018 2040 04/13/2018 1711 Full Code [id number]  Germain Osgood, PA-C ED   12/21/2015 1721 12/25/2015 1606 Partial Code [id number]  Samella Parr, NP Inpatient   12/04/2015 1653 12/15/2015 1451 Full Code [id number]  Flora Lipps Inpatient   07/13/2015 1955 07/17/2015 1637 Partial Code [id number]  Ivor Costa, MD ED   04/21/2015 1658 04/22/2015 1624 Full Code [id number]  Gabriel Earing, PA-C Inpatient   07/29/2011 1802 08/03/2011 1434 Full Code [id number]  Raquel James, RN Inpatient   Advance Care Planning Activity       Prognosis:   < 6 months if continues dialysis ; <2 weeks if dialysis stopped  Discharge Planning:  To Be Determined  Care plan was discussed with primary RN, patient's family  Thank you for allowing the Palliative Medicine Team to assist in the care of this patient.   Total Time 45 minutes Prolonged Time Billed  no       Greater than 50%  of this time was spent counseling and coordinating care related to the above assessment and plan.  Lin Landsman, NP  Please contact Palliative Medicine Team phone at [phone] for questions and concerns.

## 2020-09-29 NOTE — Progress Notes (Signed)
   09/29/20 2345  Assess: MEWS Score  Temp 98.8 F (37.1 C)  BP (!) 77/37  Pulse Rate 60  ECG Heart Rate 60  Resp 16  SpO2 95 %  Assess: MEWS Score  MEWS Temp 0  MEWS Systolic 2  MEWS Pulse 0  MEWS RR 0  MEWS LOC 0  MEWS Score 2  MEWS Score Color Yellow  Assess: if the MEWS score is Yellow or Red  Were vital signs taken at a resting state? Yes  Focused Assessment Change from prior assessment (see assessment flowsheet)  Early Detection of Sepsis Score *See Row Information* Low  MEWS guidelines implemented *See Row Information* Yes  Treat  MEWS Interventions Other (Comment) (b/p soft/ MD aware)  Pain Scale 0-10  Pain Score Asleep  Take Vital Signs  Increase Vital Sign Frequency  Yellow: Q 2hr X 2 then Q 4hr X 2, if remains yellow, continue Q 4hrs  Escalate  MEWS: Escalate Yellow: discuss with charge nurse/RN and consider discussing with provider and RRT  Notify: Charge Nurse/RN  Name of Charge Nurse/RN Notified [place]  Date Charge Nurse/RN Notified 09/29/20  Time Charge Nurse/RN Notified [id number]

## 2020-09-29 NOTE — Progress Notes (Addendum)
BP has not been accurate and MD is aware of unreliable reading of BP cuff. Will continue record data.will continue to monitor   Phoebe Sharps, RN

## 2020-09-29 NOTE — Progress Notes (Signed)
[facility]                                                                              Patient Demographics  [name], is a 80 y.o. female, DOB - 02-05-1941, SWN:[id number]  Admit date - 09/23/2020   Admitting Physician Debra Reach, MD  Outpatient Primary MD for the patient is Debra Huddle, MD  Outpatient specialists:   LOS - 6  days   Medical records reviewed and are as summarized below:    Chief Complaint  Patient presents with  . Seizures       Brief summary   80 y.o.femalewith medical history significant ofend-stage renal disease on hemodialysis TTS, coronary artery disease, carotid stenosis, atrial fibrillation, previous history of nonsustained V. tach, diabetes, essential hypertension, hyperlipidemia, hard of hearing who presented with multiple episodes of syncope prior to admit, later found to have vtach by EMS. Pt was admitted for further work up.  Patient was scheduled to undergo left heart cath on 3/14 when she sustained an episode of symptomatic V. Tach (code blue), requiring brief chest compressions with ROSC.  Underwent emergent cath on 3/13 with new 100% ostial and proximal CTO of SVG RCA.  EP was consulted, not a candidate for ICD.  CT angio showed no PE.  Patient continued to have several episodes of nonsustained V. tach or ventricular bigeminy, hypoxia.  Cardiology recommended amiodarone, no beta-blocker due to bradycardia and amiodarone. Palliative medicine following, goals of care discussed, DNR/DNI, for now continue full scope medical treatment.  Nephrology following for hemodialysis.   Assessment & Plan    Principal Problem: Recurrent symptomatic ventricular tachycardia with multiple syncopal episodes ([place]), hypotension -Cardiac cath on 3/13 with findings of new 100% ostial and proximal CTO of SVG RCA. - seen by EP, not a candidate for ICD.  No further invasive options from cardiac standpoint. -Continue aspirin, Plavix,  statin -Cardiology following, now transitioned to oral amiodarone -Palliative medicine was consulted, currently goals DNR/DNI, continue full scope medical treatment.  Per nephrology, will dialyze in a.m. if patient is not able to tolerate, will consider transitioning to comfort care -Patient's arterial line was pulled yesterday after decision to transfer to telemetry floor however BP readings much lower.  Patient was placed on midodrine and IV fluid bolus.  Discussed with cardiology, in Cath Lab, her pressures were at least 30 to 40 mm higher with art line, hence has been difficult to monitor her blood pressure without the arterial line now.  Paroxysmal atrial fibrillation-postop -Received amiodarone bolus with drip, now transitioned to oral amiodarone -Cardiology following, not a candidate for anticoagulation due to previous brain bleed  Diabetes mellitus type 2, IDDM, on long-term insulin, with nephropathy -Hemoglobin A1c 5.0 -Continue sliding scale insulin  Essential hypertension BP is not correlating and is a issue now as her arterial line is out.  Was placed on midodrine.  ESRD on hemodialysis, TTS -Nephrology following, plan to dialyze tomorrow and assess if she is able to tolerate.  If patient is not able to tolerate HD, will consider pursuing comfort care.  Palliative medicine is following  Anemia of chronic disease -Continue ESA, H&H  currently stable  Transaminitis -Possibly due to #1, hypotension, no acute complaints/abdominal pain -LFTs are improving, continue monitoring  Generalized debility, very deconditioned, FTT -Somewhat somnolent today, increase mobility, PT OT  Obesity Estimated body mass index is 29.93 kg/m as calculated from the following:   Height as of this encounter: 5\' 4"  (1.626 m).   Weight as of this encounter: 79.1 kg.  Code Status: DNR/DNI DVT Prophylaxis:  Heparin subcu  Level of Care: Level of care: Telemetry Cardiac Family Communication:  Discussed all imaging results, lab results, explained to the patient's daughter, 2 granddaughters at the bedside on 3/16.  I called the patient's daughter, [name] on phone number [phone], unable to make contact today   Disposition Plan:     Status is: Inpatient, transfer to telemetry floor  Remains inpatient appropriate because:Inpatient level of care appropriate due to severity of illness   Dispo: The patient is from: Home              Anticipated d/c is to: TBD              Patient currently is not medically stable to d/c.    Difficult to place patient No  Time Spent in minutes   25 minutes  Procedures:  Left heart catheterization 3/13  Consultants:   Cardiology Palliative medicine Nephrology  Antimicrobials:   Anti-infectives (From admission, onward)   None         Medications  Scheduled Meds: . allopurinol  100 mg Oral q AM  . amiodarone  200 mg Oral BID  . brimonidine  1 drop Right Eye QHS   And  . brimonidine  2 drop Left Eye QHS  . calcium carbonate  1 tablet Oral Q lunch  . Chlorhexidine Gluconate Cloth  6 each Topical Daily  . Chlorhexidine Gluconate Cloth  6 each Topical Q0600  . Chlorhexidine Gluconate Cloth  6 each Topical Q0600  . cinacalcet  30 mg Oral Once per day on Tue Thu  . clopidogrel  75 mg Oral q AM  . feeding supplement (NEPRO CARB STEADY)  237 mL Oral TID BM  . insulin aspart  0-5 Units Subcutaneous QHS  . insulin aspart  0-6 Units Subcutaneous TID WC  . latanoprost  1 drop Both Eyes QHS  . midodrine  5 mg Oral TID WC  . multivitamin  1 tablet Oral QHS  . polycarbophil  625 mg Oral BID WC  . sevelamer carbonate  1,600 mg Oral BID WC  . sodium chloride flush  3 mL Intravenous Q12H  . sodium chloride flush  3 mL Intravenous Q12H  . timolol  1 drop Left Eye QHS   Continuous Infusions: . sodium chloride    . sodium chloride    . sodium chloride    . sodium chloride     PRN Meds:.sodium chloride, sodium chloride, sodium  chloride, acetaminophen, alteplase, famotidine, heparin, lidocaine (PF), lidocaine-prilocaine, morphine injection, nitroGLYCERIN, oxyCODONE-acetaminophen, pentafluoroprop-tetrafluoroeth, polyvinyl alcohol, sodium chloride flush      Subjective:   [name] was seen and examined today.  Overnight issues with BP monitoring, seems to be much more lethargic today but arousable.  No active nausea vomiting.  Temp of 101.4 F.  Objective:   Vitals:   09/29/20 0700 09/29/20 0704 09/29/20 0800 09/29/20 1200  BP: (!) 48/37 (!) 83/71 98/89   Pulse: 72 72 70 64  Resp: 20 18 (!) 21 18  Temp:  (!) 101.4 F (38.6 C)    TempSrc:  Axillary    SpO2: 100% 100% 100% 100%  Weight:      Height:        Intake/Output Summary (Last 24 hours) at 09/29/2020 1209 Last data filed at 09/29/2020 1000 Gross per 24 hour  Intake 360 ml  Output 1500 ml  Net -1140 ml     Wt Readings from Last 3 Encounters:  09/29/20 79.1 kg  07/03/20 74.8 kg  05/17/20 77.1 kg   Physical Exam  General: Somewhat lethargic and somnolent but arousable,frail and deconditioned  Cardiovascular: S1 S2 clear, RRR. No pedal edema b/l  Respiratory: Diminished breath sound at the bases  Gastrointestinal: Soft, nontender, nondistended, NBS  Ext: no pedal edema bilaterally  Neuro: no new deficits  Musculoskeletal: No cyanosis, clubbing  Skin: No rashes  Psych: somnolent and lethargic   Data Reviewed:  I have personally reviewed following labs and imaging studies  Micro Results Recent Results (from the past 240 hour(s))  Resp Panel by RT-PCR (Flu A&B, Covid) Nasopharyngeal Swab     Status: None   Collection Time: 09/23/20  7:59 PM   Specimen: Nasopharyngeal Swab; Nasopharyngeal(NP) swabs in vial transport medium  Result Value Ref Range Status   SARS Coronavirus 2 by RT PCR NEGATIVE NEGATIVE Final    Comment: (NOTE) SARS-CoV-2 target nucleic acids are NOT DETECTED.  The SARS-CoV-2 RNA is generally detectable in  upper respiratory specimens during the acute phase of infection. The lowest concentration of SARS-CoV-2 viral copies this assay can detect is 138 copies/mL. A negative result does not preclude SARS-Cov-2 infection and should not be used as the sole basis for treatment or other patient management decisions. A negative result may occur with  improper specimen collection/handling, submission of specimen other than nasopharyngeal swab, presence of viral mutation(s) within the areas targeted by this assay, and inadequate number of viral copies(<138 copies/mL). A negative result must be combined with clinical observations, patient history, and epidemiological information. The expected result is Negative.  Fact Sheet for Patients:  EntrepreneurPulse.com.au  Fact Sheet for Healthcare Providers:  IncredibleEmployment.be  This test is no t yet approved or cleared by the [place] FDA and  has been authorized for detection and/or diagnosis of SARS-CoV-2 by FDA under an Emergency Use Authorization (EUA). This EUA will remain  in effect (meaning this test can be used) for the duration of the COVID-19 declaration under Section 564(b)(1) of the Act, 21 U.S.C.section 360bbb-3(b)(1), unless the authorization is terminated  or revoked sooner.       Influenza A by PCR NEGATIVE NEGATIVE Final   Influenza B by PCR NEGATIVE NEGATIVE Final    Comment: (NOTE) The Xpert Xpress SARS-CoV-2/FLU/RSV plus assay is intended as an aid in the diagnosis of influenza from Nasopharyngeal swab specimens and should not be used as a sole basis for treatment. Nasal washings and aspirates are unacceptable for Xpert Xpress SARS-CoV-2/FLU/RSV testing.  Fact Sheet for Patients: EntrepreneurPulse.com.au  Fact Sheet for Healthcare Providers: IncredibleEmployment.be  This test is not yet approved or cleared by the [place] FDA and has been  authorized for detection and/or diagnosis of SARS-CoV-2 by FDA under an Emergency Use Authorization (EUA). This EUA will remain in effect (meaning this test can be used) for the duration of the COVID-19 declaration under Section 564(b)(1) of the Act, 21 U.S.C. section 360bbb-3(b)(1), unless the authorization is terminated or revoked.  Performed at [facility], [place] [address] [place], [place] [address]   MRSA PCR Screening     Status: None  Collection Time: 09/24/20 12:08 PM   Specimen: Nasopharyngeal  Result Value Ref Range Status   MRSA by PCR NEGATIVE NEGATIVE Final    Comment:        The GeneXpert MRSA Assay (FDA approved for NASAL specimens only), is one component of a comprehensive MRSA colonization surveillance program. It is not intended to diagnose MRSA infection nor to guide or monitor treatment for MRSA infections. Performed at [facility], [place] [address], [place], [place] [address]     Radiology Reports CT Head Wo Contrast  Result Date: 09/23/2020 CLINICAL DATA:  Encephalopathy EXAM: CT HEAD WITHOUT CONTRAST TECHNIQUE: Contiguous axial images were obtained from the base of the skull through the vertex without intravenous contrast. COMPARISON:  None. FINDINGS: Brain: There is no mass, hemorrhage or extra-axial collection. The size and configuration of the ventricles and extra-axial CSF spaces are normal. There is hypoattenuation of the white matter, most commonly indicating chronic small vessel disease. Vascular: Atherosclerotic calcification of the internal carotid arteries at the skull base. No abnormal hyperdensity of the major intracranial arteries or dural venous sinuses. Skull: The visualized skull base, calvarium and extracranial soft tissues are normal. Sinuses/Orbits: Right maxillary sinus mucosal thickening. The orbits are normal. IMPRESSION: Chronic small vessel disease without acute intracranial abnormality. Electronically Signed   By: Ulyses Jarred M.D.   On: 09/23/2020 20:47   CT ANGIO CHEST PE W OR WO CONTRAST  Result Date: 09/24/2020 CLINICAL DATA:  History of syncope and ventricular tachycardia EXAM: CT ANGIOGRAPHY CHEST WITH CONTRAST TECHNIQUE: Multidetector CT imaging of the chest was performed using the standard protocol during bolus administration of intravenous contrast. Multiplanar CT image reconstructions and MIPs were obtained to evaluate the vascular anatomy. CONTRAST:  76mL OMNIPAQUE IOHEXOL 350 MG/ML SOLN COMPARISON:  09/23/2020 FINDINGS: Cardiovascular: This is a technically adequate opacification of the pulmonary vasculature. No filling defects or pulmonary emboli. The heart is enlarged without pericardial effusion. Normal caliber of the thoracic aorta. Extensive atherosclerosis of the aorta and coronary vasculature. Postsurgical changes from previous CABG. Mediastinum/Nodes: No enlarged mediastinal, hilar, or axillary lymph nodes. Thyroid gland, trachea, and esophagus demonstrate no significant findings. Lungs/Pleura: There are moderate bilateral pleural effusions volume estimated less than 1 L each. Dense bilateral dependent lower lobe consolidation consistent with atelectasis. No pneumothorax. Central airways are patent. Upper Abdomen: No acute abnormality. Musculoskeletal: There are minimally displaced acute right anterolateral fourth through seventh rib fractures, likely related to recent CPR. Numerous other prior bilateral healed rib fractures are also noted. No other acute bony abnormalities. Reconstructed images demonstrate no additional findings. Review of the MIP images confirms the above findings. IMPRESSION: 1. No evidence of pulmonary embolus. 2. Moderate bilateral pleural effusions with dense bilateral lower lobe atelectasis. 3. Right anterolateral fourth through seventh acute rib fractures consistent with recent CPR. 4.  Aortic Atherosclerosis (ICD10-I70.0). Electronically Signed   By: Randa Ngo M.D.   On:  09/24/2020 21:17   MR BRAIN WO CONTRAST  Result Date: 09/24/2020 CLINICAL DATA:  23-year-old female with recurrent syncope and confusion. EXAM: MRI HEAD WITHOUT CONTRAST TECHNIQUE: Multiplanar, multiecho pulse sequences of the brain and surrounding structures were obtained without intravenous contrast. COMPARISON:  Head CT 09/23/2020.  Brain MRI 07/13/2015. FINDINGS: Brain: No restricted diffusion to suggest acute infarction. No midline shift, mass effect, evidence of mass lesion, ventriculomegaly, extra-axial collection or acute intracranial hemorrhage. Cervicomedullary junction and pituitary are within normal limits. Mild generalized cerebral volume loss since 2016. But gray and white matter signal remains largely  normal for age. No cortical encephalomalacia. But there are occasional chronic micro hemorrhages in the brain, including the posterior right frontal lobe (series 14, image 37) and left cerebellum (image 13). Furthermore, there is intrinsic T1 hyperintensity in the bilateral globus pallidus since 2016 (series 16, image 30 on the left. Vascular: Major intracranial vascular flow voids are stable since 2016. 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 Mild associated visible cervical spinal stenosis, including at C2-C3. Heterogeneous bone marrow signal but within normal limits. Sinuses/Orbits: Negative orbits. Mild to moderate right maxillary sinus mucosal thickening is new, but other paranasal sinuses are improved compared to 2016. Other: Trace right mastoid fluid.  Negative visible nasopharynx. IMPRESSION: 1. Intrinsic T1 hyperintensity has developed in the bilateral globus pallidus since a 2016 MRI. This is most frequently associated with Hepatic Insufficiency / Cirrhosis. Correlation with serum Ammonia levels and liver function tests is recommended. 2. No other acute intracranial abnormality. Mild generalized  cerebral volume loss since 2016 with occasional chronic micro hemorrhages in the brain. 3. 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 Electronically Signed   By: Genevie Ann M.D.   On: 09/24/2020 11:34   CARDIAC CATHETERIZATION  Result Date: 09/24/2020  -----------NATIVE VESSELS-------------------------  Ost LAD to Prox LAD lesion is 95% stenosed. Prox LAD lesion is 100% stenosed with 100% stenosed side branch in Ost 1st Diag.  Ost 1st Mrg lesion is 75% stenosed.  Prox Cx to Mid Cx lesion is 100% stenosed (just after 1st Mrg) with side branch in Ost 2nd Mrg.  Prox RCA lesion is 95% stenosed. Mid RCA to Dist RCA lesion is 100% stenosed.  ------------GRAFTS--------------------------  SVG-RCA graft was visualized by angiography and is small. Origin to Mid Graft lesion is 100% stenosed. - Previously stented Graft  SVG-1stDiag graft was visualized by angiography. Dist Graft to Insertion lesion is 55% stenosed.  LIMA-dLAD graft was visualized by angiography and is normal in caliber. The graft exhibits no disease. There is no competitive flow  ---------------------------  There is mild left ventricular systolic dysfunction. The left ventricular ejection fraction is 45-50% by visual estimate.  LV end diastolic pressure is mildly elevated.  There is a significant difference in arterial pressures versus cuff pressures-arterial pressures are at least 30- 40 mmHg higher.  SUMMARY  Severe native vessel CAD:  100% LAD following a 95% calcified lesion prior to SP1;  100% mid RCA following severe 80% stenosis,  100% AV groove LCx after OM1 with bridging collaterals to small PL branch,  75 to 80% calcified ostial OM1 (large bifurcating ramus-like vessel)  Grafts:  New lesion: 100% ostial and proximal CTO of SVG-RCA (was a very small caliber vessel previously stented.  The nature of this occlusion is such that it does not appear to be acute-the patient not actively  having chest pain and no ST elevations, minimal troponin. => Likely not a reasonable target for PCI  SVG- 1st Diag has an anastomotic 50 to 60% stenosis of questionable significance (at this stage, the graft is the same caliber as a target vessel) ->  I chose not to attempt intervention on this location as she is hemodynamically clinically stable at this point. Would not want to complicate issues by potentially occluding another graft.  Patent LIMA-LAD with no left-to-right collaterals  Mildly reduced EF with inferobasal hypokinesis-45 to 50%.  Borderline elevated LVEDP of 15 mmHg  Significant discrepancy between aortic blood pressures and cuff blood  pressures of at least 30 mmHg.  Cuff pressures are significantly lower. RECOMMENDATIONS  Return to CCU, would continue amiodarone load, consider lidocaine if VT recurs.  EP consultation to consider the possibility of ICD.  For future stabilization, could consider the possibility of intervention SVG-1st Diag (albeit a questionable significance), also atherectomy PCI of the OM1 (this is similar in appearance to previous cath in 2018, so this would only be to optimize distal flow - would likely need ischemic evaluation prior to proceeding with PCI on either lesion).  Prior to initiating pressor support in the ICU, would consider invasive arterial monitoring   DG Chest Port 1 View  Result Date: 09/23/2020 CLINICAL DATA:  Ventricular tachycardia, cardiac arrest status post resuscitation EXAM: PORTABLE CHEST 1 VIEW COMPARISON:  04/10/2018 FINDINGS: Single frontal view of the chest demonstrates stable enlargement the cardiac silhouette, with postsurgical changes from median sternotomy and bypass surgery. Retrocardiac consolidation may reflect atelectasis, infection, or aspiration. Small left pleural effusion. No pneumothorax. No acute displaced fractures. Vascular stent left axillary region. IMPRESSION: 1. Left lower lobe consolidation may reflect atelectasis,  infection, or aspiration. 2. Small left pleural effusion. Electronically Signed   By: Randa Ngo M.D.   On: 09/23/2020 19:21   ECHOCARDIOGRAM COMPLETE  Result Date: 09/24/2020    ECHOCARDIOGRAM REPORT   Patient Name:   Chrysa MAE Favaro Date of Exam: 09/24/2020 Medical Rec #:  [id number]         Height:       64.0 in Accession #:    [id number]        Weight:       170.0 lb Date of Birth:  June 04, 1941          BSA:          1.826 m Patient Age:    38 years          BP:           84/65 mmHg Patient Gender: F                 HR:           52 bpm. Exam Location:  Inpatient Procedure: 2D Echo Indications:    cardiac arrest  History:        Patient has prior history of Echocardiogram examinations, most                 recent 05/31/2020. CAD, Prior CABG, end stage renal disease,                 Arrythmias:ventricular tachycardia; Signs/Symptoms:Syncope.  Sonographer:    Johny Chess Referring Phys: [place]  1. Patient had VT/VF arrest within minutes of starting echocardiogram. Remainder of study completed post cath, post arrest.  2. Left ventricular ejection fraction, by estimation, is 45%. The left ventricle has mildly decreased function. The left ventricle demonstrates regional wall motion abnormalities (see scoring diagram/findings for description). Left ventricular diastolic  parameters are consistent with Grade II diastolic dysfunction (pseudonormalization).  3. Right ventricular systolic function is severely reduced. The right ventricular size is moderately enlarged. There is normal pulmonary artery systolic pressure. The estimated right ventricular systolic pressure is 44.0 mmHg.  4. Left atrial size was moderately dilated.  5. Right atrial size was moderately dilated.  6. The mitral valve is degenerative. Mild mitral valve regurgitation. No evidence of mitral stenosis.  7. The aortic valve is grossly normal. There is mild thickening of the aortic valve. Aortic valve regurgitation  is not visualized. No aortic stenosis is present.  8. The inferior vena cava is normal in size with <50% respiratory variability, suggesting right atrial pressure of 8 mmHg. FINDINGS  Left Ventricle: Left ventricular ejection fraction, by estimation, is 45%. The left ventricle has mildly decreased function. The left ventricle demonstrates regional wall motion abnormalities. The left ventricular internal cavity size was normal in size. There is no left ventricular hypertrophy. Left ventricular diastolic parameters are consistent with Grade II diastolic dysfunction (pseudonormalization).  LV Wall Scoring: The basal inferolateral segment and basal inferior segment are akinetic. Right Ventricle: The right ventricular size is moderately enlarged. Right vetricular wall thickness was not well visualized. Right ventricular systolic function is severely reduced. There is normal pulmonary artery systolic pressure. The tricuspid regurgitant velocity is 2.27 m/s, and with an assumed right atrial pressure of 8 mmHg, the estimated right ventricular systolic pressure is 62.1 mmHg. Left Atrium: Left atrial size was moderately dilated. Right Atrium: Right atrial size was moderately dilated. Pericardium: There is no evidence of pericardial effusion. Mitral Valve: The mitral valve is degenerative in appearance. Mild mitral annular calcification. Mild mitral valve regurgitation. No evidence of mitral valve stenosis. Tricuspid Valve: The tricuspid valve is normal in structure. Tricuspid valve regurgitation is mild. Aortic Valve: The aortic valve is grossly normal. There is mild thickening of the aortic valve. Aortic valve regurgitation is not visualized. No aortic stenosis is present. Pulmonic Valve: The pulmonic valve was not well visualized. Pulmonic valve regurgitation is trivial. Aorta: The aortic root is normal in size and structure. Venous: The inferior vena cava is normal in size with less than 50% respiratory variability,  suggesting right atrial pressure of 8 mmHg. IAS/Shunts: No atrial level shunt detected by color flow Doppler.  LEFT VENTRICLE PLAX 2D LVIDd:         4.40 cm Diastology LVIDs:         3.60 cm LV e' medial:    5.77 cm/s LV PW:         0.90 cm LV E/e' medial:  24.4 LV IVS:        0.70 cm LV e' lateral:   7.51 cm/s                        LV E/e' lateral: 18.8  RIGHT VENTRICLE            IVC RV S prime:     4.35 cm/s  IVC diam: 1.80 cm LEFT ATRIUM             Index       RIGHT ATRIUM           Index LA diam:        3.60 cm 1.97 cm/m  RA Area:     16.20 cm LA Vol (A2C):   71.5 ml 39.16 ml/m RA Volume:   41.10 ml  22.51 ml/m LA Vol (A4C):   34.5 ml 18.90 ml/m LA Biplane Vol: 51.6 ml 28.26 ml/m  AORTIC VALVE LVOT Vmax:   84.90 cm/s LVOT Vmean:  57.800 cm/s LVOT VTI:    0.221 m  AORTA Ao Asc diam: 2.30 cm MITRAL VALVE                TRICUSPID VALVE MV Area (PHT): 2.87 cm     TR Peak grad:   20.6 mmHg MV Decel Time: 264 msec     TR Vmax:        227.00 cm/s  MV E velocity: 141.00 cm/s MV A velocity: 80.80 cm/s   SHUNTS MV E/A ratio:  1.75         Systemic VTI: 0.22 m Cherlynn Kaiser MD Electronically signed by Cherlynn Kaiser MD Signature Date/Time: 09/24/2020/4:44:35 PM    Final     Lab Data:  CBC: Recent Labs  Lab 09/23/20 1937 09/24/20 0315 09/25/20 0424 09/26/20 1250  WBC 5.1 6.1 8.1 7.5  NEUTROABS 4.1  --   --   --   HGB 11.0* 11.0* 10.3* 10.2*  HCT 34.5* 34.2* 33.7* 33.2*  MCV 106.2* 105.9* 109.1* 107.8*  PLT 129* 123* 129* 096*   Basic Metabolic Panel: Recent Labs  Lab 09/23/20 1937 09/24/20 0315 09/24/20 1059 09/25/20 0424 09/26/20 0513 09/27/20 0110 09/28/20 0358  NA 137  --  139 133* 133* 136 135  K 3.3*  --  3.7 4.6 4.5 3.8 3.8  CL 93*  --  94* 90* 88* 96* 94*  CO2 34*  --  34* 30 31 28 29   GLUCOSE 200*  --  157* 194* 178* 170* 166*  BUN 7*  --  12 19 29* 15 33*  CREATININE 2.74*   < > 3.62* 4.46* 5.62* 3.48* 5.34*  CALCIUM 7.7*  --  8.1* 8.5* 8.7* 8.6* 8.4*  MG 1.7  --  1.8  2.7*  --  2.1  --   PHOS 2.2*  --   --   --  4.8*  --   --    < > = values in this interval not displayed.   GFR: Estimated Creatinine Clearance: 8.6 mL/min (A) (by C-G formula based on SCr of 5.34 mg/dL (H)). Liver Function Tests: Recent Labs  Lab 09/23/20 1937 09/26/20 0513 09/27/20 0110 09/28/20 0358  AST 43*  --  139* 53*  ALT 27  --  143* 107*  ALKPHOS 75  --  66 70  BILITOT 0.6  --  0.7 0.7  PROT 6.2*  --  5.7* 5.4*  ALBUMIN 3.0* 2.6* 2.5* 2.5*   No results for input(s): LIPASE, AMYLASE in the last 168 hours. Recent Labs  Lab 09/26/20 0513  AMMONIA 36*   Coagulation Profile: Recent Labs  Lab 09/23/20 1937  INR 1.1   Cardiac Enzymes: No results for input(s): CKTOTAL, CKMB, CKMBINDEX, TROPONINI in the last 168 hours. BNP (last 3 results) No results for input(s): PROBNP in the last 8760 hours. HbA1C: No results for input(s): HGBA1C in the last 72 hours. CBG: Recent Labs  Lab 09/28/20 0815 09/28/20 1136 09/28/20 1536 09/28/20 2120 09/29/20 0633  GLUCAP 159* 152* 123* 253* 206*   Lipid Profile: No results for input(s): CHOL, HDL, LDLCALC, TRIG, CHOLHDL, LDLDIRECT in the last 72 hours. Thyroid Function Tests: No results for input(s): TSH, T4TOTAL, FREET4, T3FREE, THYROIDAB in the last 72 hours. Anemia Panel: No results for input(s): VITAMINB12, FOLATE, FERRITIN, TIBC, IRON, RETICCTPCT in the last 72 hours. Urine analysis:    Component Value Date/Time   COLORURINE YELLOW 12/21/2015 2222   APPEARANCEUR CLEAR 12/21/2015 2222   LABSPEC 1.013 12/21/2015 2222   PHURINE 6.5 12/21/2015 2222   GLUCOSEU NEGATIVE 12/21/2015 2222   HGBUR MODERATE (A) 12/21/2015 2222   BILIRUBINUR NEGATIVE 12/21/2015 2222   KETONESUR NEGATIVE 12/21/2015 2222   PROTEINUR >300 (A) 12/21/2015 2222   UROBILINOGEN 0.2 04/19/2015 1248   NITRITE POSITIVE (A) 12/21/2015 2222   LEUKOCYTESUR TRACE (A) 12/21/2015 2222     Noell Lorensen M.D. [facility] 09/29/2020, 12:09  PM  Available via [employer]  secure chat 7am-7pm After 7 pm, please refer to night coverage provider listed on amion.

## 2020-09-29 NOTE — Progress Notes (Signed)
Pt transfused  from 2 heart , will continue to monitor . Bath given with CHG, pt stated pain of 9. PRN med given. Pt has a stage 2  Pressure injury on right buttocks. Bilateral fistulas upper arms. Tele monitor applied and CCMD notified.    Dyann Kief, RN

## 2020-09-29 NOTE — Progress Notes (Signed)
Pt. Arrived to unit vitals taken .  RN stated  that BP has not been accurate and MD is aware of unreliable reading of BP cuff. Will continue record data.  Dyann Kief, RN   09/29/20 1751  Vitals  Temp 99.2 F (37.3 C)  Temp Source Axillary  BP (!) 78/33  MAP (mmHg) (!) 64  BP Location Right Leg  BP Method Automatic  Patient Position (if appropriate) Lying  Pulse Rate 67  Pulse Rate Source Monitor  ECG Heart Rate 64  Resp 20  Level of Consciousness  Level of Consciousness Alert  MEWS COLOR  MEWS Score Color Yellow  Oxygen Therapy  SpO2 90 %  O2 Device Room Air  Pain Assessment  Pain Scale 0-10  Pain Score 9  MEWS Score  MEWS Temp 0  MEWS Systolic 2  MEWS Pulse 0  MEWS RR 0  MEWS LOC 0  MEWS Score 2

## 2020-09-30 DIAGNOSIS — I2510 Atherosclerotic heart disease of native coronary artery without angina pectoris: Secondary | ICD-10-CM | POA: Diagnosis not present

## 2020-09-30 DIAGNOSIS — D638 Anemia in other chronic diseases classified elsewhere: Secondary | ICD-10-CM | POA: Diagnosis not present

## 2020-09-30 DIAGNOSIS — I472 Ventricular tachycardia: Secondary | ICD-10-CM | POA: Diagnosis not present

## 2020-09-30 DIAGNOSIS — R55 Syncope and collapse: Secondary | ICD-10-CM | POA: Diagnosis not present

## 2020-09-30 LAB — GLUCOSE, CAPILLARY
Glucose-Capillary: 161 mg/dL — ABNORMAL HIGH (ref 70–99)
Glucose-Capillary: 234 mg/dL — ABNORMAL HIGH (ref 70–99)
Glucose-Capillary: 237 mg/dL — ABNORMAL HIGH (ref 70–99)
Glucose-Capillary: 188 mg/dL — ABNORMAL HIGH (ref 70–99)

## 2020-09-30 LAB — MAGNESIUM: Magnesium: 2.1 mg/dL (ref 1.7–2.4)

## 2020-09-30 MED ORDER — MIDODRINE HCL 5 MG PO TABS
10.0000 mg | ORAL_TABLET | Freq: Three times a day (TID) | ORAL | Status: DC
  Administered 2020-09-30 – 2020-10-09 (×26): 10 mg via ORAL
  Filled 2020-09-30 (×26): qty 2

## 2020-09-30 MED ORDER — HYDROCORTISONE (PERIANAL) 2.5 % EX CREA
TOPICAL_CREAM | Freq: Three times a day (TID) | CUTANEOUS | Status: DC
  Administered 2020-09-30 – 2020-10-06 (×5): 1 via RECTAL
  Filled 2020-09-30 (×2): qty 28.35

## 2020-09-30 MED FILL — Medication: Qty: 1 | Status: AC

## 2020-09-30 NOTE — Plan of Care (Signed)
  Problem: Health Behavior/Discharge Planning: Goal: Ability to manage health-related needs will improve Outcome: Not Progressing   

## 2020-09-30 NOTE — Progress Notes (Signed)
Notified on call of patient's declining b/ps. Patient still lethargic. Paged returned. No orders given since patient is HD, anuric. No bolus at this time

## 2020-09-30 NOTE — Progress Notes (Signed)
[facility] Progress Note   Subjective:   Events of last night noted, Pt moved to 4E. BP remains low with cuff on ankle. RN notes pt has been "groggy" since transfer. Also reports dark, tarry stool and hemorrhoids. Pt reports RLQ pain. Denies SOB, CP and dizziness.   Objective Vitals:   09/30/20 0522 09/30/20 0525 09/30/20 0600 09/30/20 0639  BP: (!) 67/27  (!) 76/26   Pulse: 63  66   Resp: 15  14   Temp: 97.9 F (36.6 C) 97.9 F (36.6 C) 97.9 F (36.6 C)   TempSrc: Oral  Oral   SpO2: 96%  97%   Weight:    82.4 kg  Height:       Physical Exam General: Chronically ill appearing female, alert and in NAD Heart: RRR, no murmur Lungs: CTA anteriorly without wheezing or rales Abdomen: Soft, non-distended, +BS. Mild TTP RLQ Extremities: No edema b/l lower extremities Dialysis Access: LUE AVF + bruit  Additional Objective Labs: Basic Metabolic Panel: Recent Labs  Lab 09/23/20 1937 09/24/20 0315 09/26/20 0513 09/27/20 0110 09/28/20 0358  NA 137   < > 133* 136 135  K 3.3*   < > 4.5 3.8 3.8  CL 93*   < > 88* 96* 94*  CO2 34*   < > 31 28 29   GLUCOSE 200*   < > 178* 170* 166*  BUN 7*   < > 29* 15 33*  CREATININE 2.74*   < > 5.62* 3.48* 5.34*  CALCIUM 7.7*   < > 8.7* 8.6* 8.4*  PHOS 2.2*  --  4.8*  --   --    < > = values in this interval not displayed.   Liver Function Tests: Recent Labs  Lab 09/23/20 1937 09/26/20 0513 09/27/20 0110 09/28/20 0358  AST 43*  --  139* 53*  ALT 27  --  143* 107*  ALKPHOS 75  --  66 70  BILITOT 0.6  --  0.7 0.7  PROT 6.2*  --  5.7* 5.4*  ALBUMIN 3.0* 2.6* 2.5* 2.5*   No results for input(s): LIPASE, AMYLASE in the last 168 hours. CBC: Recent Labs  Lab 09/23/20 1937 09/24/20 0315 09/25/20 0424 09/26/20 1250  WBC 5.1 6.1 8.1 7.5  NEUTROABS 4.1  --   --   --   HGB 11.0* 11.0* 10.3* 10.2*  HCT 34.5* 34.2* 33.7* 33.2*  MCV 106.2* 105.9* 109.1* 107.8*  PLT 129* 123* 129* 124*   Blood Culture    Component Value  Date/Time   SDES TRACHEAL ASPIRATE 04/03/2018 0325   SPECREQUEST NONE 04/03/2018 0325   CULT  04/03/2018 0325    Consistent with normal respiratory flora. Performed at [facility], [place] [address], [place], [place] [address]    REPTSTATUS 04/05/2018 FINAL 04/03/2018 0325    CBG: Recent Labs  Lab 09/29/20 1238 09/29/20 1526 09/29/20 1810 09/29/20 2110 09/30/20 0638  GLUCAP 218* 237* 211* 203* 161*   Medications: . sodium chloride    . sodium chloride    . sodium chloride    . sodium chloride     . allopurinol  100 mg Oral q AM  . amiodarone  200 mg Oral BID  . brimonidine  1 drop Right Eye QHS   And  . brimonidine  2 drop Left Eye QHS  . calcium carbonate  1 tablet Oral Q lunch  . Chlorhexidine Gluconate Cloth  6 each Topical Daily  . Chlorhexidine Gluconate Cloth  6 each  Topical Q0600  . Chlorhexidine Gluconate Cloth  6 each Topical Q0600  . cinacalcet  30 mg Oral Once per day on Tue Thu  . clopidogrel  75 mg Oral q AM  . feeding supplement (NEPRO CARB STEADY)  237 mL Oral TID BM  . hydrocortisone   Rectal TID  . hydrocortisone  25 mg Rectal BID  . insulin aspart  0-5 Units Subcutaneous QHS  . insulin aspart  0-6 Units Subcutaneous TID WC  . latanoprost  1 drop Both Eyes QHS  . midodrine  10 mg Oral TID WC  . multivitamin  1 tablet Oral QHS  . polycarbophil  625 mg Oral BID WC  . sevelamer carbonate  1,600 mg Oral BID WC  . sodium chloride flush  3 mL Intravenous Q12H  . sodium chloride flush  3 mL Intravenous Q12H  . timolol  1 drop Left Eye QHS    Outpatient Dialysis Orders: [facility] Tuesday Thursday Saturday Time:4 hrsEDW 76.5 K 3 Ca 2.5  Micera 100 q 2 weeks  Assessment/Plan: 1. Vtach/ CAD s/p CABG: cath 09/25/31 with severe native CAD with 100% occlusion multiple vessels.  Grafts with new 100% occlusion 100% ostial and proximal CTO of SVG-RCA.  On amio for Vtach.   2.ESRD TTS-- HD today, if patient tolerates.  Anticipate we will not be able to get much, if any, UF due to blood pressures. Per palliative care notes, plan to revisit goals of care based on how well patient tolerates dialysis today. 3. Anemia: Hgb 10.2 on 3/15. Per RN, dark tarry stools overnight. Will recheck CBC prior to HD.  4. CKD-MBD: Checking RFP, continue binders and vits 5. Nutrition: protein supps 6. Hypotension: cuff and aline pressures don't correlate, a line now removed. Morphine discontinued and midodrine dose increased  Anice Paganini, PA-C 09/30/2020, 9:53 AM  [facility] Pager: [phone]

## 2020-09-30 NOTE — Progress Notes (Addendum)
This RN unable to get patient to open eyes. Patient barely opens her eyes. Patient very sleepy. This RN had open patient's eyes to get eye drops in. Unable to give any oral meds due to lethargy. On call MD made aware.   Pressures remain soft.   At shift change, patient was awake and would wink at staff. Will continue to monitor

## 2020-09-30 NOTE — Progress Notes (Signed)
   09/30/20 0300  Vitals  Temp 97.9 F (36.6 C)  Temp Source Axillary  BP (!) 66/32  MAP (mmHg) (!) 42  BP Location Right Leg  BP Method Automatic  Patient Position (if appropriate) Lying  Pulse Rate (!) 53  ECG Heart Rate (!) 53  Resp 15  Level of Consciousness  Level of Consciousness Responds to Voice  MEWS COLOR  MEWS Score Color Red  Oxygen Therapy  SpO2 97 %  O2 Device Nasal Cannula  O2 Flow Rate (L/min) 4 L/min  MEWS Score  MEWS Temp 0  MEWS Systolic 3  MEWS Pulse 0  MEWS RR 0  MEWS LOC 1  MEWS Score 4

## 2020-09-30 NOTE — Progress Notes (Signed)
Daily Progress Note   Patient Name: [name]       Date: 09/30/2020 DOB: May 11, 1941  Age: 80 y.o. MRN#: [id number] Attending Physician: Bonnell Public, MD Primary Care Physician: Josetta Huddle, MD Admit Date: 09/23/2020  Reason for Consultation/Follow-up: Establishing goals of care  Subjective: Chart review performed. Received report from primary RN - no acute concerns. RN states patient ate a magic cup for breakfast - still with minimal PO intake. RN also reports daughter/Mimmi requested PMT not enter room or visit today.   Patient has not yet been taken for dialysis.  Discussed PMT recommendations with RN to assist with continued support for family and patient.  Length of Stay: 7  Current Medications: Scheduled Meds:  . allopurinol  100 mg Oral q AM  . amiodarone  200 mg Oral BID  . brimonidine  1 drop Right Eye QHS   And  . brimonidine  2 drop Left Eye QHS  . calcium carbonate  1 tablet Oral Q lunch  . Chlorhexidine Gluconate Cloth  6 each Topical Daily  . Chlorhexidine Gluconate Cloth  6 each Topical Q0600  . Chlorhexidine Gluconate Cloth  6 each Topical Q0600  . cinacalcet  30 mg Oral Once per day on Tue Thu  . clopidogrel  75 mg Oral q AM  . feeding supplement (NEPRO CARB STEADY)  237 mL Oral TID BM  . hydrocortisone   Rectal TID  . hydrocortisone  25 mg Rectal BID  . insulin aspart  0-5 Units Subcutaneous QHS  . insulin aspart  0-6 Units Subcutaneous TID WC  . latanoprost  1 drop Both Eyes QHS  . midodrine  10 mg Oral TID WC  . multivitamin  1 tablet Oral QHS  . polycarbophil  625 mg Oral BID WC  . sevelamer carbonate  1,600 mg Oral BID WC  . sodium chloride flush  3 mL Intravenous Q12H  . sodium chloride flush  3 mL Intravenous Q12H  . timolol  1 drop  Left Eye QHS    Continuous Infusions: . sodium chloride    . sodium chloride    . sodium chloride    . sodium chloride      PRN Meds: sodium chloride, sodium chloride, sodium chloride, acetaminophen, alteplase, famotidine, heparin, lidocaine (PF), lidocaine-prilocaine, nitroGLYCERIN, oxyCODONE-acetaminophen, pentafluoroprop-tetrafluoroeth, polyvinyl alcohol, sodium  chloride flush, witch hazel-glycerin  Physical Exam        - unable to perform, family requested PMT not enter room/visit today  Vital Signs: BP (!) 82/28   Pulse 63   Temp (P) 100.1 F (37.8 C) (Axillary)   Resp 16   Ht 5\' 4"  (1.626 m)   Wt 82.4 kg   SpO2 96%   BMI 31.18 kg/m  SpO2: SpO2: 96 % O2 Device: O2 Device: Nasal Cannula O2 Flow Rate: O2 Flow Rate (L/min): 2 L/min (2)  Intake/output summary:   Intake/Output Summary (Last 24 hours) at 09/30/2020 1426 Last data filed at 09/29/2020 1600 Gross per 24 hour  Intake 120 ml  Output -  Net 120 ml   LBM: Last BM Date: 09/29/20 Baseline Weight: Weight: 77.1 kg Most recent weight: Weight: 82.4 kg       Palliative Assessment/Data:  PPS 20-30%      Patient Active Problem List   Diagnosis Date Noted  . Pressure injury of skin 09/28/2020  . Coronary artery disease involving autologous vein bypass graft 09/25/2020  . Demand ischemia of myocardium ([place]) 09/25/2020  . Syncope and collapse 09/23/2020  . Body mass index (BMI) 32.0-32.9, adult 08/01/2020  . Cervical disc disorder 08/01/2020  . Cough variant asthma 08/01/2020  . Dependence on renal dialysis ([place]) 08/01/2020  . Diabetic renal disease ([place]) 08/01/2020  . Diabetic retinopathy associated with type 2 diabetes mellitus ([place]) 08/01/2020  . Edema 08/01/2020  . Encounter for general adult medical examination without abnormal findings 08/01/2020  . Gout 08/01/2020  . Hereditary and idiopathic neuropathy, unspecified 08/01/2020  . Hyperglycemia due to type 2 diabetes mellitus ([place]) 08/01/2020  .  Joint pain 08/01/2020  . 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 Osteoarthritis of knee 08/01/2020  . Other long term (current) drug therapy 08/01/2020  . Other polyosteoarthritis 08/01/2020  . Pain in unspecified knee 08/01/2020  . Proteinuria 08/01/2020  . Pure hypercholesterolemia 08/01/2020  . Pure hypertriglyceridemia 08/01/2020  . Urinary tract infectious disease 08/01/2020  . Vitamin D deficiency 08/01/2020  . Vomiting 08/01/2020  . Other disorders of phosphorus metabolism 01/11/2020  . Encounter for removal of sutures 12/04/2019  . Shortness of breath 07/29/2019  . Anaphylactic shock, unspecified, sequela 04/23/2019  . Secondary hyperparathyroidism of renal origin ([place]) 09/21/2018  . Iron deficiency anemia, unspecified 06/08/2018  . Hemorrhoids   . Diabetes mellitus type 2 in nonobese (HCC)   . Anemia, chronic disease   . Labile blood pressure   . Poorly controlled type 2 diabetes mellitus with peripheral neuropathy ([place])   . Debility   . Benign essential HTN   . Bradycardia   . Ventricular tachycardia ([place])   . Enteritis due to Clostridium difficile   . ESRD on dialysis ([place])   . Diabetic peripheral neuropathy ([place])   . Acute blood loss anemia   . Anemia of chronic disease   . Aspiration into airway   . NSTEMI (non-ST elevated myocardial infarction) ([place])   . QT prolongation 04/03/2018  . Acute respiratory failure with hypoxia ([place])   . Aspiration pneumonia due to gastric secretions ([place])   . Cardiac arrest ([place]) 04/02/2018  . Mild protein-calorie malnutrition ([place]) 03/13/2018  . Anxiety 05/13/2017  . Panic attack 05/13/2017  . SAH (subarachnoid hemorrhage) ([place]) 02/03/2017  . Fall 02/03/2017  . Spinal stenosis, lumbar region 03/02/2016  . Trochanteric bursitis of left hip 01/03/2016  . Generalized weakness 12/21/2015  . Hypokalemia 12/21/2015  . Lumbar back pain with radiculopathy affecting  left lower extremity 12/04/2015  . Coronary artery disease  involving native coronary artery of native heart without angina pectoris   . CRI (chronic renal insufficiency), stage 4 (severe) (HCC)   . Urinary retention   . Anemia of chronic kidney failure   . Hx of gout   . Thrombocytopenia ([place])   . S/P lumbar discectomy 12/01/2015  . 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 Acute encephalopathy   . Chronic kidney disease (CKD), stage V ([place]) 07/13/2015  . Glaucoma   . Paroxysmal atrial fibrillation (HCC)   . Coronary artery disease involving native heart without angina pectoris   . Depression   . Stroke ([place])   . Diabetes mellitus without complication ([place])   . Gastroesophageal reflux disease with esophagitis   . Carotid artery stenosis 04/21/2015  . Essential hypertension 06/08/2014  . Hyperlipidemia associated with type 2 diabetes mellitus ([place]) 06/08/2014  . Occlusion and stenosis of carotid artery without mention of cerebral infarction 01/01/2012    Palliative Care Assessment & Plan   Patient Profile: 80 y.o.femalewith past medical history of ESRD on dialysis TTS, coronary artery disease s/p CABG, carotid stenosis, atrial fibrillation, previous history of non-sustained v-tach, DM2, HTN, HLD, and hearing loss presenting to the emergency departmenton3/12/2022with multiple episodes of syncope.She reportedly had an episode right after dialysis. Had another episode at home, became unresponsive, and family started CPR. When EMS arrived, monitor showed nonsustained v-tach bigeminy, lidocaine infusion was started.  ED Course: On arrival to ED patient was back in sinus rhythm. Chest x-ray showed left lower lobe consolidation probably atelectasis versus infection or aspiration. She was admitted to [facility] for further evaluation of syncopal episodes.   On 3/13, patient was undergoing echocardiogram and developed symptomatic v-tach requiring CPR. She underwent emergent  cardiac cath with findings of new 100% stenosis in multiple vessels. Patient was seen by EP, but is not a candidate for ICD.  Assessment: Syncopal episodes with collapse Symptomatic ventricular tachycardia Paroxysmal atrial fibrillation Coronary artery disease DM with nephropathy Essential hypertension ESRD on HD Anemia of chronic disease Generalized debility Deconditioned state Failure to thrive  Recommendations/Plan:  Continue current full scope medical treatment  Continue DNR/DNI as previously documented  Family requested PMT not enter patient's room or visit today   Plan is for dialysis today and assess for tolerance   Per nursing, patient would like to see her granddaughter get married in April. Pending patient's PO intake and tolerance of HD, this may be possible?  Patient is, however, likely approaching EOL and would be hospice appropriate due to continued minimal PO intake/failure to thrive - anticipate patient will continue to decline  Family were clear yesterday 3/18 they do not wish to pursue PEG tube  Ongoing GOC discussions pending clinical course - PMT will follow up and see if family agreeable for visit tomorrow 3/20  PMT will continue to follow and support holistically  Goals of Care and Additional Recommendations:  Limitations on Scope of Treatment: Full Scope Treatment, No Artificial Feeding and No Tracheostomy  Code Status:    Code Status Orders  (From admission, onward)         Start     Ordered   09/25/20 1446  Do not attempt resuscitation (DNR)  Continuous       Question Answer Comment  In the event of cardiac or respiratory ARREST Do not call a "code blue"   In the event of cardiac or respiratory  ARREST Do not perform Intubation, CPR, defibrillation or ACLS   In the event of cardiac or respiratory ARREST Use medication by any route, position, wound care, and other measures to relive pain and suffering. May use oxygen, suction and manual  treatment of airway obstruction as needed for comfort.      09/25/20 1445        Code Status History    Date Active Date Inactive Code Status Order ID Comments User Context   09/25/2020 1258 09/25/2020 1445 Partial Code [id number]  Vickii Penna, RN Inpatient   09/24/2020 0155 09/25/2020 1258 Full Code [id number]  Elwyn Reach, MD Inpatient   04/16/2018 1024 04/28/2018 1821 Partial Code [id number]  Roney Jaffe, MD Inpatient   04/15/2018 1620 04/15/2018 1621 DNR [id number]  Cathlyn Parsons, PA-C Inpatient   04/15/2018 1620 04/15/2018 1620 Full Code [id number]  Cathlyn Parsons, PA-C Inpatient   04/13/2018 1711 04/15/2018 1542 DNR [id number]  Roney Jaffe, MD Inpatient   04/02/2018 2040 04/13/2018 1711 Full Code [id number]  Germain Osgood, PA-C ED   12/21/2015 1721 12/25/2015 1606 Partial Code [id number]  Samella Parr, NP Inpatient   12/04/2015 1653 12/15/2015 1451 Full Code [id number]  Bary Leriche, PA-C Inpatient   07/13/2015 1955 07/17/2015 1637 Partial Code [id number]  Ivor Costa, MD ED   04/21/2015 1658 04/22/2015 1624 Full Code [id number]  Gabriel Earing, PA-C Inpatient   07/29/2011 1802 08/03/2011 1434 Full Code [id number]  Raquel James, RN Inpatient   Advance Care Planning Activity       Prognosis:   < 6 months if continues dialysis; <2 weeks if dialysis stopped  Continued minimal PO intake is a very poor prognostic indicator  Discharge Planning:  To Be Determined  Care plan was discussed with primary RN  Thank you for allowing the Palliative Medicine Team to assist in the care of this patient.   Total Time 15 minutes Prolonged Time Billed  no       Greater than 50%  of this time was spent counseling and coordinating care related to the above assessment and plan.  Lin Landsman, NP  Please contact Palliative Medicine Team phone at [phone] for questions and concerns.

## 2020-09-30 NOTE — Progress Notes (Signed)
Notified Dr. Carolin Sicks (nephrology) concerning IV access for this patient. Please see care order for info. IV team can not place IV in leg. IV team will place IV in right (restricted but maturing fistula and not being used for HD right now). Will continue to monitor

## 2020-09-30 NOTE — Progress Notes (Signed)
[facility]                                                                              Patient Demographics  [name], is a 80 y.o. female, DOB - [birth year], RWE:[id number]  Admit date - 09/23/2020   Admitting Physician Elwyn Reach, MD  Outpatient Primary MD for the patient is Josetta Huddle, MD  Outpatient specialists:   LOS - 7  days   Medical records reviewed and are as summarized below:    Chief Complaint  Patient presents with  . Seizures       Brief summary   80 y.o.femalewith medical history significant ofend-stage renal disease on hemodialysis TTS, coronary artery disease, carotid stenosis, atrial fibrillation, previous history of nonsustained V. tach, diabetes, essential hypertension, hyperlipidemia, hard of hearing who presented with multiple episodes of syncope prior to admission, later found to have vtach by EMS. Pt was admitted for further work up.  Patient was scheduled to undergo left heart cath on 3/14 when she sustained an episode of symptomatic V. Tach (code blue), requiring brief chest compressions with ROSC.  Underwent emergent cath on 3/13 with new 100% ostial and proximal CTO of SVG RCA.  EP was consulted, not a candidate for ICD.  CT angio showed no PE.  Patient continued to have several episodes of nonsustained V. tach or ventricular bigeminy, hypoxia.  Cardiology recommended amiodarone, no beta-blocker due to bradycardia and amiodarone. Palliative medicine following, goals of care discussed, DNR/DNI, for now continue full scope medical treatment.  Nephrology following for hemodialysis.  09/30/2020: Patient seen alongside patient's daughter and nurse.  Discussed extensively with patient's nurse and daughter.  Patient is awaiting hemodialysis.  Blood pressure remains low.  According to the patient's daughter, patient has had a chronically low blood pressure and has been able to tolerate hemodialysis despite significantly low blood  pressure.  Overall, the prognosis remains guarded.  No significant history from the patient.   Assessment & Plan    Principal Problem: Recurrent symptomatic ventricular tachycardia with multiple syncopal episodes ([name]), hypotension -Cardiac cath on 3/13 with findings of new 100% ostial and proximal CTO of SVG RCA. - seen by EP, not a candidate for ICD.  No further invasive options from cardiac standpoint. -Continue aspirin, Plavix, statin -Cardiology following, now transitioned to oral amiodarone -Palliative medicine was consulted, currently goals DNR/DNI, continue full scope medical treatment.  Per nephrology, will dialyze in a.m. if patient is not able to tolerate, will consider transitioning to comfort care -Patient's arterial line was pulled yesterday after decision to transfer to telemetry floor however BP readings much lower.  Patient was placed on midodrine and IV fluid bolus.  Discussed with cardiology, in Cath Lab, her pressures were at least 30 to 40 mm higher with art line, hence has been difficult to monitor her blood pressure without the arterial line now. 09/30/2020: We defer to the cardiology team.  Paroxysmal atrial fibrillation-postop -Received amiodarone bolus with drip, now transitioned to oral amiodarone -Cardiology following, not a candidate for anticoagulation due to previous brain bleed 09/30/2020: Cardiology is managing.   Diabetes mellitus type 2, IDDM, on long-term insulin,  with nephropathy -Hemoglobin A1c 5.0 -Continue sliding scale insulin  Essential hypertension BP is not correlating and is a issue now as her arterial line is out.  Was placed on midodrine.  ESRD on hemodialysis, TTS -Nephrology following, plan to dialyze tomorrow and assess if she is able to tolerate.  If patient is not able to tolerate HD, will consider pursuing comfort care.  Palliative medicine is following 09/30/2020: For possible hemodialysis today.  Nephrology input is highly  appreciated.  Anemia of chronic disease -Continue ESA, H&H currently stable 09/30/2020: Hemoglobin is currently at goal.  Transaminitis -Possibly due to #1, hypotension, no acute complaints/abdominal pain -LFTs are improving, continue monitoring  Generalized debility, very deconditioned, FTT -Somewhat somnolent today, increase mobility, PT OT  Obesity Estimated body mass index is 31.18 kg/m as calculated from the following:   Height as of this encounter: 5\' 4"  (1.626 m).   Weight as of this encounter: 82.4 kg.  Code Status: DNR/DNI DVT Prophylaxis:  Heparin subcu  Level of Care: Level of care: Telemetry Cardiac Family Communication: Updated patient's daughter.  Patient's daughter was at bedside.  Disposition Plan:     Status is: Inpatient, transfer to telemetry floor  Remains inpatient appropriate because:Inpatient level of care appropriate due to severity of illness   Dispo: The patient is from: Home              Anticipated d/c is to: TBD              Patient currently is not medically stable to d/c.    Difficult to place patient No  Time Spent in minutes   35 minutes  Procedures:  Left heart catheterization 3/13  Consultants:   Cardiology Palliative medicine Nephrology  Antimicrobials:   Anti-infectives (From admission, onward)   None         Medications  Scheduled Meds: . allopurinol  100 mg Oral q AM  . amiodarone  200 mg Oral BID  . brimonidine  1 drop Right Eye QHS   And  . brimonidine  2 drop Left Eye QHS  . calcium carbonate  1 tablet Oral Q lunch  . Chlorhexidine Gluconate Cloth  6 each Topical Daily  . Chlorhexidine Gluconate Cloth  6 each Topical Q0600  . Chlorhexidine Gluconate Cloth  6 each Topical Q0600  . cinacalcet  30 mg Oral Once per day on Tue Thu  . clopidogrel  75 mg Oral q AM  . feeding supplement (NEPRO CARB STEADY)  237 mL Oral TID BM  . hydrocortisone   Rectal TID  . hydrocortisone  25 mg Rectal BID  . insulin aspart   0-5 Units Subcutaneous QHS  . insulin aspart  0-6 Units Subcutaneous TID WC  . latanoprost  1 drop Both Eyes QHS  . midodrine  10 mg Oral TID WC  . multivitamin  1 tablet Oral QHS  . polycarbophil  625 mg Oral BID WC  . sevelamer carbonate  1,600 mg Oral BID WC  . sodium chloride flush  3 mL Intravenous Q12H  . sodium chloride flush  3 mL Intravenous Q12H  . timolol  1 drop Left Eye QHS   Continuous Infusions: . sodium chloride    . sodium chloride    . sodium chloride    . sodium chloride     PRN Meds:.sodium chloride, sodium chloride, sodium chloride, acetaminophen, alteplase, famotidine, heparin, lidocaine (PF), lidocaine-prilocaine, nitroGLYCERIN, oxyCODONE-acetaminophen, pentafluoroprop-tetrafluoroeth, polyvinyl alcohol, sodium chloride flush, witch hazel-glycerin      Subjective:  No significant history from patient.  Objective:   Vitals:   09/30/20 0600 09/30/20 0639 09/30/20 0800 09/30/20 0900  BP: (!) 76/26  (!) 64/25 (!) 82/28  Pulse: 66  69 63  Resp: 14  12 16   Temp: 97.9 F (36.6 C)  97.9 F (36.6 C) 97.9 F (36.6 C)  TempSrc: Oral     SpO2: 97%  98% 96%  Weight:  82.4 kg    Height:        Intake/Output Summary (Last 24 hours) at 09/30/2020 1214 Last data filed at 09/29/2020 1600 Gross per 24 hour  Intake 120 ml  Output --  Net 120 ml     Wt Readings from Last 3 Encounters:  09/30/20 82.4 kg  07/03/20 74.8 kg  05/17/20 77.1 kg   Physical Exam  General: Mildly lethargic. Awake.  Cardiovascular: S1 S2   Respiratory: Diminished breath sound    Gastrointestinal: Soft, nontender, nondistended, NBS  Ext: Edema of the extremities.  Neuro: Awake and mildly lethargic.   Data Reviewed:  I have personally reviewed following labs and imaging studies  Micro Results Recent Results (from the past 240 hour(s))  Resp Panel by RT-PCR (Flu A&B, Covid) Nasopharyngeal Swab     Status: None   Collection Time: 09/23/20  7:59 PM   Specimen:  Nasopharyngeal Swab; Nasopharyngeal(NP) swabs in vial transport medium  Result Value Ref Range Status   SARS Coronavirus 2 by RT PCR NEGATIVE NEGATIVE Final    Comment: (NOTE) SARS-CoV-2 target nucleic acids are NOT DETECTED.  The SARS-CoV-2 RNA is generally detectable in upper respiratory specimens during the acute phase of infection. The lowest concentration of SARS-CoV-2 viral copies this assay can detect is 138 copies/mL. A negative result does not preclude SARS-Cov-2 infection and should not be used as the sole basis for treatment or other patient management decisions. A negative result may occur with  improper specimen collection/handling, submission of specimen other than nasopharyngeal swab, presence of viral mutation(s) within the areas targeted by this assay, and inadequate number of viral copies(<138 copies/mL). A negative result must be combined with clinical observations, patient history, and epidemiological information. The expected result is Negative.  Fact Sheet for Patients:  EntrepreneurPulse.com.au  Fact Sheet for Healthcare Providers:  IncredibleEmployment.be  This test is no t yet approved or cleared by the [place] FDA and  has been authorized for detection and/or diagnosis of SARS-CoV-2 by FDA under an Emergency Use Authorization (EUA). This EUA will remain  in effect (meaning this test can be used) for the duration of the COVID-19 declaration under Section 564(b)(1) of the Act, 21 U.S.C.section 360bbb-3(b)(1), unless the authorization is terminated  or revoked sooner.       Influenza A by PCR NEGATIVE NEGATIVE Final   Influenza B by PCR NEGATIVE NEGATIVE Final    Comment: (NOTE) The Xpert Xpress SARS-CoV-2/FLU/RSV plus assay is intended as an aid in the diagnosis of influenza from Nasopharyngeal swab specimens and should not be used as a sole basis for treatment. Nasal washings and aspirates are unacceptable for  Xpert Xpress SARS-CoV-2/FLU/RSV testing.  Fact Sheet for Patients: EntrepreneurPulse.com.au  Fact Sheet for Healthcare Providers: IncredibleEmployment.be  This test is not yet approved or cleared by the [place] FDA and has been authorized for detection and/or diagnosis of SARS-CoV-2 by FDA under an Emergency Use Authorization (EUA). This EUA will remain in effect (meaning this test can be used) for the duration of the COVID-19 declaration under Section 564(b)(1) of the Act, 21  U.S.C. section 360bbb-3(b)(1), unless the authorization is terminated or revoked.  Performed at [facility], [place] [address], [place], [place] [address]   MRSA PCR Screening     Status: None   Collection Time: 09/24/20 12:08 PM   Specimen: Nasopharyngeal  Result Value Ref Range Status   MRSA by PCR NEGATIVE NEGATIVE Final    Comment:        The GeneXpert MRSA Assay (FDA approved for NASAL specimens only), is one component of a comprehensive MRSA colonization surveillance program. It is not intended to diagnose MRSA infection nor to guide or monitor treatment for MRSA infections. Performed at [facility], [place] [address], [place], [place] [address]     Radiology Reports CT Head Wo Contrast  Result Date: 09/23/2020 CLINICAL DATA:  Encephalopathy EXAM: CT HEAD WITHOUT CONTRAST TECHNIQUE: Contiguous axial images were obtained from the base of the skull through the vertex without intravenous contrast. COMPARISON:  None. FINDINGS: Brain: There is no mass, hemorrhage or extra-axial collection. The size and configuration of the ventricles and extra-axial CSF spaces are normal. There is hypoattenuation of the white matter, most commonly indicating chronic small vessel disease. Vascular: Atherosclerotic calcification of the internal carotid arteries at the skull base. No abnormal hyperdensity of the major intracranial arteries or dural venous sinuses. Skull:  The visualized skull base, calvarium and extracranial soft tissues are normal. Sinuses/Orbits: Right maxillary sinus mucosal thickening. The orbits are normal. IMPRESSION: Chronic small vessel disease without acute intracranial abnormality. Electronically Signed   By: Ulyses Jarred M.D.   On: 09/23/2020 20:47   CT ANGIO CHEST PE W OR WO CONTRAST  Result Date: 09/24/2020 CLINICAL DATA:  History of syncope and ventricular tachycardia EXAM: CT ANGIOGRAPHY CHEST WITH CONTRAST TECHNIQUE: Multidetector CT imaging of the chest was performed using the standard protocol during bolus administration of intravenous contrast. Multiplanar CT image reconstructions and MIPs were obtained to evaluate the vascular anatomy. CONTRAST:  78mL OMNIPAQUE IOHEXOL 350 MG/ML SOLN COMPARISON:  09/23/2020 FINDINGS: Cardiovascular: This is a technically adequate opacification of the pulmonary vasculature. No filling defects or pulmonary emboli. The heart is enlarged without pericardial effusion. Normal caliber of the thoracic aorta. Extensive atherosclerosis of the aorta and coronary vasculature. Postsurgical changes from previous CABG. Mediastinum/Nodes: No enlarged mediastinal, hilar, or axillary lymph nodes. Thyroid gland, trachea, and esophagus demonstrate no significant findings. Lungs/Pleura: There are moderate bilateral pleural effusions volume estimated less than 1 L each. Dense bilateral dependent lower lobe consolidation consistent with atelectasis. No pneumothorax. Central airways are patent. Upper Abdomen: No acute abnormality. Musculoskeletal: There are minimally displaced acute right anterolateral fourth through seventh rib fractures, likely related to recent CPR. Numerous other prior bilateral healed rib fractures are also noted. No other acute bony abnormalities. Reconstructed images demonstrate no additional findings. Review of the MIP images confirms the above findings. IMPRESSION: 1. No evidence of pulmonary embolus. 2.  Moderate bilateral pleural effusions with dense bilateral lower lobe atelectasis. 3. Right anterolateral fourth through seventh acute rib fractures consistent with recent CPR. 4.  Aortic Atherosclerosis (ICD10-I70.0). Electronically Signed   By: Randa Ngo M.D.   On: 09/24/2020 21:17   MR BRAIN WO CONTRAST  Result Date: 09/24/2020 CLINICAL DATA:  77-year-old female with recurrent syncope and confusion. EXAM: MRI HEAD WITHOUT CONTRAST TECHNIQUE: Multiplanar, multiecho pulse sequences of the brain and surrounding structures were obtained without intravenous contrast. COMPARISON:  Head CT 09/23/2020.  Brain MRI 07/13/2015. FINDINGS: Brain: No restricted diffusion to suggest acute infarction. No midline  shift, mass effect, evidence of mass lesion, ventriculomegaly, extra-axial collection or acute intracranial hemorrhage. Cervicomedullary junction and pituitary are within normal limits. Mild generalized cerebral volume loss since 2016. But gray and white matter signal remains largely normal for age. No cortical encephalomalacia. But there are occasional chronic micro hemorrhages in the brain, including the posterior right frontal lobe (series 14, image 37) and left cerebellum (image 13). Furthermore, there is intrinsic T1 hyperintensity in the bilateral globus pallidus since 2016 (series 16, image 30 on the left. Vascular: Major intracranial vascular flow voids are stable since 2016. 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 Mild associated visible cervical spinal stenosis, including at C2-C3. Heterogeneous bone marrow signal but within normal limits. Sinuses/Orbits: Negative orbits. Mild to moderate right maxillary sinus mucosal thickening is new, but other paranasal sinuses are improved compared to 2016. Other: Trace right mastoid fluid.  Negative visible nasopharynx. IMPRESSION: 1. Intrinsic T1 hyperintensity has  developed in the bilateral globus pallidus since a 2016 MRI. This is most frequently associated with Hepatic Insufficiency / Cirrhosis. Correlation with serum Ammonia levels and liver function tests is recommended. 2. No other acute intracranial abnormality. Mild generalized cerebral volume loss since 2016 with occasional chronic micro hemorrhages in the brain. 3. 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 Electronically Signed   By: Genevie Ann M.D.   On: 09/24/2020 11:34   CARDIAC CATHETERIZATION  Result Date: 09/24/2020  -----------NATIVE VESSELS-------------------------  Ost LAD to Prox LAD lesion is 95% stenosed. Prox LAD lesion is 100% stenosed with 100% stenosed side branch in Ost 1st Diag.  Ost 1st Mrg lesion is 75% stenosed.  Prox Cx to Mid Cx lesion is 100% stenosed (just after 1st Mrg) with side branch in Ost 2nd Mrg.  Prox RCA lesion is 95% stenosed. Mid RCA to Dist RCA lesion is 100% stenosed.  ------------GRAFTS--------------------------  SVG-RCA graft was visualized by angiography and is small. Origin to Mid Graft lesion is 100% stenosed. - Previously stented Graft  SVG-1stDiag graft was visualized by angiography. Dist Graft to Insertion lesion is 55% stenosed.  LIMA-dLAD graft was visualized by angiography and is normal in caliber. The graft exhibits no disease. There is no competitive flow  ---------------------------  There is mild left ventricular systolic dysfunction. The left ventricular ejection fraction is 45-50% by visual estimate.  LV end diastolic pressure is mildly elevated.  There is a significant difference in arterial pressures versus cuff pressures-arterial pressures are at least 30- 40 mmHg higher.  SUMMARY  Severe native vessel CAD:  100% LAD following a 95% calcified lesion prior to SP1;  100% mid RCA following severe 80% stenosis,  100% AV groove LCx after OM1 with bridging collaterals to small PL branch,  75 to 80%  calcified ostial OM1 (large bifurcating ramus-like vessel)  Grafts:  New lesion: 100% ostial and proximal CTO of SVG-RCA (was a very small caliber vessel previously stented.  The nature of this occlusion is such that it does not appear to be acute-the patient not actively having chest pain and no ST elevations, minimal troponin. => Likely not a reasonable target for PCI  SVG- 1st Diag has an anastomotic 50 to 60% stenosis of questionable significance (at this stage, the graft is the same caliber as a target vessel) ->  I chose not to attempt intervention on this location as she is hemodynamically clinically stable at this point. Would not want to complicate issues by  potentially occluding another graft.  Patent LIMA-LAD with no left-to-right collaterals  Mildly reduced EF with inferobasal hypokinesis-45 to 50%.  Borderline elevated LVEDP of 15 mmHg  Significant discrepancy between aortic blood pressures and cuff blood pressures of at least 30 mmHg.  Cuff pressures are significantly lower. RECOMMENDATIONS  Return to CCU, would continue amiodarone load, consider lidocaine if VT recurs.  EP consultation to consider the possibility of ICD.  For future stabilization, could consider the possibility of intervention SVG-1st Diag (albeit a questionable significance), also atherectomy PCI of the OM1 (this is similar in appearance to previous cath in 2018, so this would only be to optimize distal flow - would likely need ischemic evaluation prior to proceeding with PCI on either lesion).  Prior to initiating pressor support in the ICU, would consider invasive arterial monitoring   DG Chest Port 1 View  Result Date: 09/23/2020 CLINICAL DATA:  Ventricular tachycardia, cardiac arrest status post resuscitation EXAM: PORTABLE CHEST 1 VIEW COMPARISON:  04/10/2018 FINDINGS: Single frontal view of the chest demonstrates stable enlargement the cardiac silhouette, with postsurgical changes from median sternotomy and  bypass surgery. Retrocardiac consolidation may reflect atelectasis, infection, or aspiration. Small left pleural effusion. No pneumothorax. No acute displaced fractures. Vascular stent left axillary region. IMPRESSION: 1. Left lower lobe consolidation may reflect atelectasis, infection, or aspiration. 2. Small left pleural effusion. Electronically Signed   By: Randa Ngo M.D.   On: 09/23/2020 19:21   ECHOCARDIOGRAM COMPLETE  Result Date: 09/24/2020    ECHOCARDIOGRAM REPORT   Patient Name:   [name] Date of Exam: 09/24/2020 Medical Rec #:  [id number]         Height:       64.0 in Accession #:    [id number]        Weight:       170.0 lb Date of Birth:  1940/12/22          BSA:          1.826 m Patient Age:    1 years          BP:           84/65 mmHg Patient Gender: F                 HR:           52 bpm. Exam Location:  Inpatient Procedure: 2D Echo Indications:    cardiac arrest  History:        Patient has prior history of Echocardiogram examinations, most                 recent 05/31/2020. CAD, Prior CABG, end stage renal disease,                 Arrythmias:ventricular tachycardia; Signs/Symptoms:Syncope.  Sonographer:    Johny Chess Referring Phys: [place]  1. Patient had VT/VF arrest within minutes of starting echocardiogram. Remainder of study completed post cath, post arrest.  2. Left ventricular ejection fraction, by estimation, is 45%. The left ventricle has mildly decreased function. The left ventricle demonstrates regional wall motion abnormalities (see scoring diagram/findings for description). Left ventricular diastolic  parameters are consistent with Grade II diastolic dysfunction (pseudonormalization).  3. Right ventricular systolic function is severely reduced. The right ventricular size is moderately enlarged. There is normal pulmonary artery systolic pressure. The estimated right ventricular systolic pressure is 61.9 mmHg.  4. Left atrial size was  moderately dilated.  5. Right atrial size  was moderately dilated.  6. The mitral valve is degenerative. Mild mitral valve regurgitation. No evidence of mitral stenosis.  7. The aortic valve is grossly normal. There is mild thickening of the aortic valve. Aortic valve regurgitation is not visualized. No aortic stenosis is present.  8. The inferior vena cava is normal in size with <50% respiratory variability, suggesting right atrial pressure of 8 mmHg. FINDINGS  Left Ventricle: Left ventricular ejection fraction, by estimation, is 45%. The left ventricle has mildly decreased function. The left ventricle demonstrates regional wall motion abnormalities. The left ventricular internal cavity size was normal in size. There is no left ventricular hypertrophy. Left ventricular diastolic parameters are consistent with Grade II diastolic dysfunction (pseudonormalization).  LV Wall Scoring: The basal inferolateral segment and basal inferior segment are akinetic. Right Ventricle: The right ventricular size is moderately enlarged. Right vetricular wall thickness was not well visualized. Right ventricular systolic function is severely reduced. There is normal pulmonary artery systolic pressure. The tricuspid regurgitant velocity is 2.27 m/s, and with an assumed right atrial pressure of 8 mmHg, the estimated right ventricular systolic pressure is 16.1 mmHg. Left Atrium: Left atrial size was moderately dilated. Right Atrium: Right atrial size was moderately dilated. Pericardium: There is no evidence of pericardial effusion. Mitral Valve: The mitral valve is degenerative in appearance. Mild mitral annular calcification. Mild mitral valve regurgitation. No evidence of mitral valve stenosis. Tricuspid Valve: The tricuspid valve is normal in structure. Tricuspid valve regurgitation is mild. Aortic Valve: The aortic valve is grossly normal. There is mild thickening of the aortic valve. Aortic valve regurgitation is not visualized. No  aortic stenosis is present. Pulmonic Valve: The pulmonic valve was not well visualized. Pulmonic valve regurgitation is trivial. Aorta: The aortic root is normal in size and structure. Venous: The inferior vena cava is normal in size with less than 50% respiratory variability, suggesting right atrial pressure of 8 mmHg. IAS/Shunts: No atrial level shunt detected by color flow Doppler.  LEFT VENTRICLE PLAX 2D LVIDd:         4.40 cm Diastology LVIDs:         3.60 cm LV e' medial:    5.77 cm/s LV PW:         0.90 cm LV E/e' medial:  24.4 LV IVS:        0.70 cm LV e' lateral:   7.51 cm/s                        LV E/e' lateral: 18.8  RIGHT VENTRICLE            IVC RV S prime:     4.35 cm/s  IVC diam: 1.80 cm LEFT ATRIUM             Index       RIGHT ATRIUM           Index LA diam:        3.60 cm 1.97 cm/m  RA Area:     16.20 cm LA Vol (A2C):   71.5 ml 39.16 ml/m RA Volume:   41.10 ml  22.51 ml/m LA Vol (A4C):   34.5 ml 18.90 ml/m LA Biplane Vol: 51.6 ml 28.26 ml/m  AORTIC VALVE LVOT Vmax:   84.90 cm/s LVOT Vmean:  57.800 cm/s LVOT VTI:    0.221 m  AORTA Ao Asc diam: 2.30 cm MITRAL VALVE  TRICUSPID VALVE MV Area (PHT): 2.87 cm     TR Peak grad:   20.6 mmHg MV Decel Time: 264 msec     TR Vmax:        227.00 cm/s MV E velocity: 141.00 cm/s MV A velocity: 80.80 cm/s   SHUNTS MV E/A ratio:  1.75         Systemic VTI: 0.22 m Cherlynn Kaiser MD Electronically signed by Cherlynn Kaiser MD Signature Date/Time: 09/24/2020/4:44:35 PM    Final     Lab Data:  CBC: Recent Labs  Lab 09/23/20 1937 09/24/20 0315 09/25/20 0424 09/26/20 1250  WBC 5.1 6.1 8.1 7.5  NEUTROABS 4.1  --   --   --   HGB 11.0* 11.0* 10.3* 10.2*  HCT 34.5* 34.2* 33.7* 33.2*  MCV 106.2* 105.9* 109.1* 107.8*  PLT 129* 123* 129* 478*   Basic Metabolic Panel: Recent Labs  Lab 09/23/20 1937 09/24/20 0315 09/24/20 1059 09/25/20 0424 09/26/20 0513 09/27/20 0110 09/28/20 0358  NA 137  --  139 133* 133* 136 135  K 3.3*  --   3.7 4.6 4.5 3.8 3.8  CL 93*  --  94* 90* 88* 96* 94*  CO2 34*  --  34* 30 31 28 29   GLUCOSE 200*  --  157* 194* 178* 170* 166*  BUN 7*  --  12 19 29* 15 33*  CREATININE 2.74*   < > 3.62* 4.46* 5.62* 3.48* 5.34*  CALCIUM 7.7*  --  8.1* 8.5* 8.7* 8.6* 8.4*  MG 1.7  --  1.8 2.7*  --  2.1  --   PHOS 2.2*  --   --   --  4.8*  --   --    < > = values in this interval not displayed.   GFR: Estimated Creatinine Clearance: 8.7 mL/min (A) (by C-G formula based on SCr of 5.34 mg/dL (H)). Liver Function Tests: Recent Labs  Lab 09/23/20 1937 09/26/20 0513 09/27/20 0110 09/28/20 0358  AST 43*  --  139* 53*  ALT 27  --  143* 107*  ALKPHOS 75  --  66 70  BILITOT 0.6  --  0.7 0.7  PROT 6.2*  --  5.7* 5.4*  ALBUMIN 3.0* 2.6* 2.5* 2.5*   No results for input(s): LIPASE, AMYLASE in the last 168 hours. Recent Labs  Lab 09/26/20 0513  AMMONIA 36*   Coagulation Profile: Recent Labs  Lab 09/23/20 1937  INR 1.1   Cardiac Enzymes: No results for input(s): CKTOTAL, CKMB, CKMBINDEX, TROPONINI in the last 168 hours. BNP (last 3 results) No results for input(s): PROBNP in the last 8760 hours. HbA1C: No results for input(s): HGBA1C in the last 72 hours. CBG: Recent Labs  Lab 09/29/20 1526 09/29/20 1810 09/29/20 2110 09/30/20 0638 09/30/20 1156  GLUCAP 237* 211* 203* 161* 234*   Lipid Profile: No results for input(s): CHOL, HDL, LDLCALC, TRIG, CHOLHDL, LDLDIRECT in the last 72 hours. Thyroid Function Tests: No results for input(s): TSH, T4TOTAL, FREET4, T3FREE, THYROIDAB in the last 72 hours. Anemia Panel: No results for input(s): VITAMINB12, FOLATE, FERRITIN, TIBC, IRON, RETICCTPCT in the last 72 hours. Urine analysis:    Component Value Date/Time   COLORURINE YELLOW 12/21/2015 2222   APPEARANCEUR CLEAR 12/21/2015 2222   LABSPEC 1.013 12/21/2015 2222   PHURINE 6.5 12/21/2015 2222   GLUCOSEU NEGATIVE 12/21/2015 2222   HGBUR MODERATE (A) 12/21/2015 2222   BILIRUBINUR NEGATIVE  12/21/2015 2222   KETONESUR NEGATIVE 12/21/2015 2222   PROTEINUR >300 (A)  12/21/2015 2222   UROBILINOGEN 0.2 04/19/2015 1248   NITRITE POSITIVE (A) 12/21/2015 2222   LEUKOCYTESUR TRACE (A) 12/21/2015 2222     Bonnell Public M.D. [facility] 09/30/2020, 12:14 PM  Available via Epic secure chat 7am-7pm After 7 pm, please refer to night coverage provider listed on amion.

## 2020-10-01 DIAGNOSIS — R630 Anorexia: Secondary | ICD-10-CM

## 2020-10-01 DIAGNOSIS — D638 Anemia in other chronic diseases classified elsewhere: Secondary | ICD-10-CM | POA: Diagnosis not present

## 2020-10-01 DIAGNOSIS — I248 Other forms of acute ischemic heart disease: Secondary | ICD-10-CM | POA: Diagnosis not present

## 2020-10-01 DIAGNOSIS — I2510 Atherosclerotic heart disease of native coronary artery without angina pectoris: Secondary | ICD-10-CM | POA: Diagnosis not present

## 2020-10-01 DIAGNOSIS — I472 Ventricular tachycardia: Secondary | ICD-10-CM | POA: Diagnosis not present

## 2020-10-01 LAB — BASIC METABOLIC PANEL
Anion gap: 12 (ref 5–15)
BUN: 44 mg/dL — ABNORMAL HIGH (ref 8–23)
CO2: 26 mmol/L (ref 22–32)
Calcium: 8.7 mg/dL — ABNORMAL LOW (ref 8.9–10.3)
Chloride: 92 mmol/L — ABNORMAL LOW (ref 98–111)
Creatinine, Ser: 5.9 mg/dL — ABNORMAL HIGH (ref 0.44–1.00)
GFR, Estimated: 7 mL/min — ABNORMAL LOW (ref >60)
Glucose, Bld: 169 mg/dL — ABNORMAL HIGH (ref 70–99)
Potassium: 4 mmol/L (ref 3.5–5.1)
Sodium: 130 mmol/L — ABNORMAL LOW (ref 135–145)

## 2020-10-01 LAB — GLUCOSE, CAPILLARY
Glucose-Capillary: 141 mg/dL — ABNORMAL HIGH (ref 70–99)
Glucose-Capillary: 193 mg/dL — ABNORMAL HIGH (ref 70–99)
Glucose-Capillary: 202 mg/dL — ABNORMAL HIGH (ref 70–99)
Glucose-Capillary: 197 mg/dL — ABNORMAL HIGH (ref 70–99)

## 2020-10-01 LAB — CBC
HCT: 31.8 % — ABNORMAL LOW (ref 36.0–46.0)
Hemoglobin: 10 g/dL — ABNORMAL LOW (ref 12.0–15.0)
MCH: 33.2 pg (ref 26.0–34.0)
MCHC: 31.4 g/dL (ref 30.0–36.0)
MCV: 105.6 fL — ABNORMAL HIGH (ref 80.0–100.0)
Platelets: 150 10*3/uL (ref 150–400)
RBC: 3.01 MIL/uL — ABNORMAL LOW (ref 3.87–5.11)
RDW: 15.7 % — ABNORMAL HIGH (ref 11.5–15.5)
WBC: 10.1 10*3/uL (ref 4.0–10.5)
nRBC: 0 % (ref 0.0–0.2)

## 2020-10-01 MED ORDER — ACETAMINOPHEN 325 MG PO TABS
650.0000 mg | ORAL_TABLET | Freq: Three times a day (TID) | ORAL | Status: DC
Start: 2020-10-01 — End: 2020-10-10
  Administered 2020-10-01 – 2020-10-08 (×19): 650 mg via ORAL
  Filled 2020-10-01 (×22): qty 2

## 2020-10-01 NOTE — Progress Notes (Signed)
[facility] Progress Note   Subjective:   Had dialysis last night with 2.5L off--> did well.  More talkative this AM.   Objective Vitals:   10/01/20 0432 10/01/20 0500 10/01/20 0700 10/01/20 0944  BP: (!) 58/20 (!) 75/21 (!) 76/21 (!) 88/41  Pulse: (!) 43 68 68 92  Resp: 13 16 16    Temp: 98.4 F (36.9 C) 99.2 F (37.3 C) 98.9 F (37.2 C) 99.6 F (37.6 C)  TempSrc: Oral Oral Oral Oral  SpO2: (!) 60% 97% 98% 96%  Weight:  75.6 kg    Height:       Physical Exam General: Chronically ill appearing female, alert and in NAD Heart: RRR, no murmur Lungs: CTA anteriorly without wheezing or rales Abdomen: Soft, non-distended, +BS. Extremities: No edema b/l lower extremities Dialysis Access: LUE AVF + bruit  Additional Objective Labs: Basic Metabolic Panel: Recent Labs  Lab 09/26/20 0513 09/27/20 0110 09/28/20 0358 10/01/20 0327  NA 133* 136 135 130*  K 4.5 3.8 3.8 4.0  CL 88* 96* 94* 92*  CO2 31 28 29 26   GLUCOSE 178* 170* 166* 169*  BUN 29* 15 33* 44*  CREATININE 5.62* 3.48* 5.34* 5.90*  CALCIUM 8.7* 8.6* 8.4* 8.7*  PHOS 4.8*  --   --   --    Liver Function Tests: Recent Labs  Lab 09/26/20 0513 09/27/20 0110 09/28/20 0358  AST  --  139* 53*  ALT  --  143* 107*  ALKPHOS  --  66 70  BILITOT  --  0.7 0.7  PROT  --  5.7* 5.4*  ALBUMIN 2.6* 2.5* 2.5*   No results for input(s): LIPASE, AMYLASE in the last 168 hours. CBC: Recent Labs  Lab 09/25/20 0424 09/26/20 1250 10/01/20 0327  WBC 8.1 7.5 10.1  HGB 10.3* 10.2* 10.0*  HCT 33.7* 33.2* 31.8*  MCV 109.1* 107.8* 105.6*  PLT 129* 124* 150   Blood Culture    Component Value Date/Time   SDES TRACHEAL ASPIRATE 04/03/2018 0325   SPECREQUEST NONE 04/03/2018 0325   CULT  04/03/2018 0325    Consistent with normal respiratory flora. Performed at [facility], [place] [address] [place], [name] [address]    REPTSTATUS 04/05/2018 FINAL 04/03/2018 0325    CBG: Recent Labs  Lab  09/30/20 0638 09/30/20 1156 09/30/20 1623 09/30/20 2216 10/01/20 0703  GLUCAP 161* 234* 237* 188* 141*   Medications: . sodium chloride    . sodium chloride    . sodium chloride    . sodium chloride     . allopurinol  100 mg Oral q AM  . amiodarone  200 mg Oral BID  . brimonidine  1 drop Right Eye QHS   And  . brimonidine  2 drop Left Eye QHS  . calcium carbonate  1 tablet Oral Q lunch  . Chlorhexidine Gluconate Cloth  6 each Topical Daily  . Chlorhexidine Gluconate Cloth  6 each Topical Q0600  . Chlorhexidine Gluconate Cloth  6 each Topical Q0600  . cinacalcet  30 mg Oral Once per day on Tue Thu  . clopidogrel  75 mg Oral q AM  . feeding supplement (NEPRO CARB STEADY)  237 mL Oral TID BM  . hydrocortisone   Rectal TID  . hydrocortisone  25 mg Rectal BID  . insulin aspart  0-5 Units Subcutaneous QHS  . insulin aspart  0-6 Units Subcutaneous TID WC  . latanoprost  1 drop Both Eyes QHS  . midodrine  10  mg Oral TID WC  . multivitamin  1 tablet Oral QHS  . polycarbophil  625 mg Oral BID WC  . sevelamer carbonate  1,600 mg Oral BID WC  . sodium chloride flush  3 mL Intravenous Q12H  . sodium chloride flush  3 mL Intravenous Q12H  . timolol  1 drop Left Eye QHS    Outpatient Dialysis Orders: [facility] Tuesday Thursday Saturday Time:4 hrsEDW 76.5 K 3 Ca 2.5  Micera 100 q 2 weeks  Assessment/Plan: 1. Vtach/ CAD s/p CABG: cath 09/25/31 with severe native CAD with 100% occlusion multiple vessels.  Grafts with new 100% occlusion 100% ostial and proximal CTO of SVG-RCA.  On amio for Vtach.   2.ESRD TTS-- HD 3/19.  Tolerated HD and got fluid off yesterday.  Palliative care discussions ongoing.  HD planned next for Tuesday. 3. Anemia: Hgb 10.2 on 3/15. Hgb stable.  Has hemorrhoids 4. CKD-MBD: Checking RFP, continue binders and vits 5. Nutrition: protein supps 6. Hypotension: cuff and aline pressures don't correlate, a line now removed. Morphine  discontinued and midodrine dose increased 7.  Dispo: pending.  Madelon Lips MD 10/01/2020, 9:50 AM  [facility] Pager: [phone]

## 2020-10-01 NOTE — Progress Notes (Signed)
Daily Progress Note   Patient Name: [name]       Date: 10/01/2020 DOB: Feb 13, 1941  Age: 80 y.o. MRN#: [id number] Attending Physician: Bonnell Public, MD Primary Care Physician: Josetta Huddle, MD Admit Date: 09/23/2020  Reason for Consultation/Follow-up: Establishing goals of care  Subjective: Chart review performed. Received report from primary RN - no acute concerns. RN states patient ate about 60-70% of lunch but remains confused at times.  Went to visit patient at bedside - daughter/Mimmi was present. Patient was lying in bed awake, alert, oriented but confused at times, and able to participate in simple conversation. No signs or non-verbal gestures of pain or discomfort noted. No respiratory distress, increased work of breathing, or secretions noted. Patient does appear chronically ill and frail.  Discussed dialysis session with patient and daughter - patient was able to tolerate HD session well. Daughter expresses relief. We also discussed patient's oral intake - patient states she has eaten today; however, she makes herself eat - she denies N/V but she explains she just has no appetite. Mimmi explains how she has had a hard time coming to terms that the patient is approaching EOL, but states she understands where "this is headed." Emotional support provided. I allowed her space and time to express her thoughts and feelings around the patient's current medical situation. Validated that it is anticipated patient will continue to decline. Mimmi states she is preparing for the time in the future when patient will be ready for hospice. Mimmi states that as long as the patient is eating, even a little bit, they will continue dialysis and then transition to hospice once patient stops  eating.   We discussed if patient was interested or willing to work with PT. Patient states she is "tired" and daughter understands that if patient is tired, unmotivated, and does not have adequate nutritional intake, PT/OT/rehab is likely not going to be beneficial - Mimmi expressed understanding and is agreeable.   Mimmi inquires about dispo options - we reviewed that current options are: rehab (though not recommended at this time), LTC facility, home (Mimmi feels she cannot provide the care pt needs at home), or residential hospice.   Mimmi kindly Electrical engineer for not speaking with PMT yesterday. I learned that her father/patient's husband is also admitted on another floor at [facility]  and recently had a leg amputated. Mimmi is under a lot of stress having to manage both her parent's acute medical issues at this time. I provided validation that she has a lot on her plate right now and it is understandable if she needed space to process. I thanked her for allowing visit today.   Patient states she is still experiencing sacral pain. I had ordered air mattress several days ago that has not yet been delivered.  Discussed scheduling tylenol - patient and daughter agreeable to try this.   I also discussed patient's short term goal to see her granddaughters wedding in mid-April. Mimmi states this is very important to the patient. We discussed trying to support that goal with possibly trying medication to stimulate her appetite - patient and daughter also agreeable to try this.   Patient would really enjoy eating a sweet potato but was unable to get this from the cafeteria - will modify diet order.  All questions and concerns addressed. Encouraged to call with questions and/or concerns. PMT card provided.   Length of Stay: 8  Current Medications: Scheduled Meds:   allopurinol  100 mg Oral q AM   amiodarone  200 mg Oral BID   brimonidine  1 drop Right Eye QHS   And   brimonidine  2 drop Left Eye QHS    calcium carbonate  1 tablet Oral Q lunch   Chlorhexidine Gluconate Cloth  6 each Topical Daily   Chlorhexidine Gluconate Cloth  6 each Topical Q0600   Chlorhexidine Gluconate Cloth  6 each Topical Q0600   cinacalcet  30 mg Oral Once per day on Tue Thu   clopidogrel  75 mg Oral q AM   feeding supplement (NEPRO CARB STEADY)  237 mL Oral TID BM   hydrocortisone   Rectal TID   hydrocortisone  25 mg Rectal BID   insulin aspart  0-5 Units Subcutaneous QHS   insulin aspart  0-6 Units Subcutaneous TID WC   latanoprost  1 drop Both Eyes QHS   midodrine  10 mg Oral TID WC   multivitamin  1 tablet Oral QHS   polycarbophil  625 mg Oral BID WC   sevelamer carbonate  1,600 mg Oral BID WC   sodium chloride flush  3 mL Intravenous Q12H   sodium chloride flush  3 mL Intravenous Q12H   timolol  1 drop Left Eye QHS    Continuous Infusions:  sodium chloride     sodium chloride     sodium chloride     sodium chloride      PRN Meds: sodium chloride, sodium chloride, sodium chloride, acetaminophen, alteplase, famotidine, heparin, lidocaine (PF), lidocaine-prilocaine, nitroGLYCERIN, oxyCODONE-acetaminophen, pentafluoroprop-tetrafluoroeth, polyvinyl alcohol, sodium chloride flush, witch hazel-glycerin  Physical Exam          Vital Signs: BP (!) 83/21 (BP Location: Right Leg)    Pulse (!) 56    Temp 98.8 F (37.1 C) (Oral)    Resp 17    Ht 5\' 4"  (1.626 m)    Wt 75.6 kg    SpO2 94%    BMI 28.61 kg/m  SpO2: SpO2: 94 % O2 Device: O2 Device: Nasal Cannula O2 Flow Rate: O2 Flow Rate (L/min): 2 L/min  Intake/output summary:   Intake/Output Summary (Last 24 hours) at 10/01/2020 1657 Last data filed at 10/01/2020 0432 Gross per 24 hour  Intake 240 ml  Output 2510 ml  Net -2270 ml   LBM: Last BM Date: 10/01/20 Baseline Weight: Weight:  77.1 kg Most recent weight: Weight: 75.6 kg       Palliative Assessment/Data:      Patient Active Problem List   Diagnosis Date Noted    Pressure injury of skin 09/28/2020   Coronary artery disease involving autologous vein bypass graft 09/25/2020   Demand ischemia of myocardium ([place]) 09/25/2020   Syncope and collapse 09/23/2020   Body mass index (BMI) 32.0-32.9, adult 08/01/2020   Cervical disc disorder 08/01/2020   Cough variant asthma 08/01/2020   Dependence on renal dialysis ([place]) 08/01/2020   Diabetic renal disease ([place]) 08/01/2020   Diabetic retinopathy associated with type 2 diabetes mellitus ([place]) 08/01/2020   Edema 08/01/2020   Encounter for general adult medical examination without abnormal findings 08/01/2020   Gout 08/01/2020   Hereditary and idiopathic neuropathy, unspecified 08/01/2020   Hyperglycemia due to type 2 diabetes mellitus ([place]) 08/01/2020   Joint pain 08/01/2020   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   Osteoarthritis of knee 08/01/2020   Other long term (current) drug therapy 08/01/2020   Other polyosteoarthritis 08/01/2020   Pain in unspecified knee 08/01/2020   Proteinuria 08/01/2020   Pure hypercholesterolemia 08/01/2020   Pure hypertriglyceridemia 08/01/2020   Urinary tract infectious disease 08/01/2020   Vitamin D deficiency 08/01/2020   Vomiting 08/01/2020   Other disorders of phosphorus metabolism 01/11/2020   Encounter for removal of sutures 12/04/2019   Shortness of breath 07/29/2019   Anaphylactic shock, unspecified, sequela 04/23/2019   Secondary hyperparathyroidism of renal origin ([place]) 09/21/2018   Iron deficiency anemia, unspecified 06/08/2018   Hemorrhoids    Diabetes mellitus type 2 in nonobese (HCC)    Anemia, chronic disease    Labile blood pressure    Poorly controlled type 2 diabetes mellitus with peripheral neuropathy ([place])    Debility    Benign essential HTN    Bradycardia    Ventricular tachycardia (HCC)    Enteritis due to Clostridium difficile    ESRD on dialysis [facility])    Diabetic peripheral neuropathy  (HCC)    Acute blood loss anemia    Anemia of chronic disease    Aspiration into airway    NSTEMI (non-ST elevated myocardial infarction) ([place])    QT prolongation 04/03/2018   Acute respiratory failure with hypoxia (HCC)    Aspiration pneumonia due to gastric secretions ([place])    Cardiac arrest ([place]) 04/02/2018   Mild protein-calorie malnutrition ([place]) 03/13/2018   Anxiety 05/13/2017   Panic attack 05/13/2017   SAH (subarachnoid hemorrhage) ([place]) 02/03/2017   Fall 02/03/2017   Spinal stenosis, lumbar region 03/02/2016   Trochanteric bursitis of left hip 01/03/2016   Generalized weakness 12/21/2015   Hypokalemia 12/21/2015   Lumbar back pain with radiculopathy affecting left lower extremity 12/04/2015   Coronary artery disease involving native coronary artery of native heart without angina pectoris    CRI (chronic renal insufficiency), stage 4 (severe) (HCC)    Urinary retention    Anemia of chronic kidney failure    Hx of gout    Thrombocytopenia (HCC)    S/P lumbar discectomy 12/01/2015   Coronary artery disease involving coronary bypass graft of native heart with unspecified angina pectoris 09/21/2015   Acute encephalopathy    Chronic kidney disease (CKD), stage V ([place]) 07/13/2015   Glaucoma    Paroxysmal atrial fibrillation (HCC)    Coronary artery disease involving native heart without angina pectoris    Depression    Stroke [facility])    Diabetes mellitus without complication ([place])    Gastroesophageal  reflux disease with esophagitis    Carotid artery stenosis 04/21/2015   Essential hypertension 06/08/2014   Hyperlipidemia associated with type 2 diabetes mellitus ([place]) 06/08/2014   Occlusion and stenosis of carotid artery without mention of cerebral infarction 01/01/2012    Palliative Care Assessment & Plan   Patient Profile: 80 y.o.femalewith past medical history of ESRD on dialysis TTS, coronary artery disease s/p CABG, carotid  stenosis, atrial fibrillation, previous history of non-sustained v-tach, DM2, HTN, HLD, and hearing loss presenting to the emergency departmenton3/12/2022with multiple episodes of syncope.She reportedly had an episode right after dialysis. Had another episode at home, became unresponsive, and family started CPR. When EMS arrived, monitor showed nonsustained v-tach bigeminy, lidocaine infusion was started.  ED Course: On arrival to ED patient was back in sinus rhythm. Chest x-ray showed left lower lobe consolidation probably atelectasis versus infection or aspiration. She was admitted to [facility] for further evaluation of syncopal episodes.   On 3/13, patient was undergoing echocardiogram and developed symptomatic v-tach requiring CPR. She underwent emergent cardiac cath with findings of new 100% stenosis in multiple vessels. Patient was seen by EP, but is not a candidate for ICD.  Assessment: Syncopal episodes with collapse Symptomatic ventricular tachycardia Paroxysmal atrial fibrillation Coronary artery disease DM with nephropathy Essential hypertension ESRD on HD Anemia of chronic disease Generalized debility Deconditioned state Failure to thrive  Recommendations/Plan:  Continue current full scope medical treatment  Continue DNR/DNI as previously documented  Daughter understands patient is approaching EOL. As long as patient is eating, goal is to continue dialysis. Once patient stops eating, family will be ready for transition to hospice and stop HD. No PEG.  Patient's short term goal is to see her granddaughter get married in mid-April. To support this goal, started Marinol 5mg  PO BID before lunch and dinner to see if helps with appetite; however, appetite stimulants are usually not effective in patient's with advanced disease. If Marinol is not effective, can consider temporary steroid use to assist patient to short term goal. Would recommend dexamethasone 4mg  PO in AM daily or  prednisolone 5mg  PO in AM daily  Discussed if patient was interested or willing to work with PT. Patient states she is "tired" and daughter understands that if patient is tired, unmotivated, and does not have adequate nutritional intake, PT/OT/rehab is likely not going to be beneficial. Patient also had a long night receiving HD - reassess for motivation to work with PT/OT tomorrow 3/21  Ordered tylenol 650mg  PO TID for sacral pain/discomfort as well as q2h turns. Air mattress was ordered several days ago and has not yet been delivered   Adjusted diet order to allow patient to order sweet potato per her request  Ongoing PMT discussions pending clinical course  PMT will continue to follow and support holistically    Goals of Care and Additional Recommendations:  Limitations on Scope of Treatment: Full Scope Treatment and No Artificial Feeding  Code Status:    Code Status Orders  (From admission, onward)         Start     Ordered   09/25/20 1446  Do not attempt resuscitation (DNR)  Continuous       Question Answer Comment  In the event of cardiac or respiratory ARREST Do not call a code blue   In the event of cardiac or respiratory ARREST Do not perform Intubation, CPR, defibrillation or ACLS   In the event of cardiac or respiratory ARREST Use medication by any route, position, wound care,  and other measures to relive pain and suffering. May use oxygen, suction and manual treatment of airway obstruction as needed for comfort.      09/25/20 1445        Code Status History    Date Active Date Inactive Code Status Order ID Comments User Context   09/25/2020 1258 09/25/2020 1445 Partial Code [id number]  Vickii Penna, RN Inpatient   09/24/2020 0155 09/25/2020 1258 Full Code [id number]  Elwyn Reach, MD Inpatient   04/16/2018 1024 04/28/2018 1821 Partial Code [id number]  Roney Jaffe, MD Inpatient   04/15/2018 1620 04/15/2018 1621 DNR [id number]  Cathlyn Parsons, PA-C Inpatient    04/15/2018 1620 04/15/2018 1620 Full Code [id number]  Cathlyn Parsons, PA-C Inpatient   04/13/2018 1711 04/15/2018 1542 DNR [id number]  Roney Jaffe, MD Inpatient   04/02/2018 2040 04/13/2018 1711 Full Code [id number]  Germain Osgood, PA-C ED   12/21/2015 1721 12/25/2015 1606 Partial Code [id number]  Samella Parr, NP Inpatient   12/04/2015 1653 12/15/2015 1451 Full Code [id number]  Bary Leriche, PA-C Inpatient   07/13/2015 1955 07/17/2015 1637 Partial Code [id number]  Ivor Costa, MD ED   04/21/2015 1658 04/22/2015 1624 Full Code [id number]  Gabriel Earing, PA-C Inpatient   07/29/2011 1802 08/03/2011 1434 Full Code [id number]  Raquel James, RN Inpatient   Advance Care Planning Activity       Prognosis:   < 6 months  Discharge Planning:  To Be Determined  Care plan was discussed with primary RN, patient, patient's daughter  Thank you for allowing the Palliative Medicine Team to assist in the care of this patient.   Total Time 35 minutes Prolonged Time Billed  no       Greater than 50%  of this time was spent counseling and coordinating care related to the above assessment and plan.  Lin Landsman, NP  Please contact Palliative Medicine Team phone at [phone] for questions and concerns.

## 2020-10-01 NOTE — Progress Notes (Signed)
Patient back from dialysis.

## 2020-10-01 NOTE — Plan of Care (Signed)
  Problem: Education: Goal: Knowledge of General Education information will improve Description Including pain rating scale, medication(s)/side effects and non-pharmacologic comfort measures Outcome: Progressing   

## 2020-10-01 NOTE — Progress Notes (Signed)
Patient to dialysis.

## 2020-10-01 NOTE — Progress Notes (Signed)
[facility]                                                                              Patient Demographics  [name], is a 80 y.o. female, DOB - [birth year], WNU:[id number]  Admit date - 09/23/2020   Admitting Physician Elwyn Reach, MD  Outpatient Primary MD for the patient is Josetta Huddle, MD  Outpatient specialists:   LOS - 8  days   Medical records reviewed and are as summarized below:    Chief Complaint  Patient presents with  . Seizures       Brief summary   80 y.o.femalewith medical history significant ofend-stage renal disease on hemodialysis TTS, coronary artery disease, carotid stenosis, atrial fibrillation, previous history of nonsustained V. tach, diabetes, essential hypertension, hyperlipidemia, hard of hearing who presented with multiple episodes of syncope prior to admission, later found to have vtach by EMS. Pt was admitted for further work up.  Patient was scheduled to undergo left heart cath on 3/14 when she sustained an episode of symptomatic V. Tach (code blue), requiring brief chest compressions with ROSC.  Underwent emergent cath on 3/13 with new 100% ostial and proximal CTO of SVG RCA.  EP was consulted, not a candidate for ICD.  CT angio showed no PE.  Patient continued to have several episodes of nonsustained V. tach or ventricular bigeminy, hypoxia.  Cardiology recommended amiodarone, no beta-blocker due to bradycardia and amiodarone. Palliative medicine following, goals of care discussed, DNR/DNI, for now continue full scope medical treatment.  Nephrology following for hemodialysis.  09/30/2020: Patient seen alongside patient's daughter and nurse.  Discussed extensively with patient's nurse and daughter.  Patient is awaiting hemodialysis.  Blood pressure remains low.  According to the patient's daughter, patient has had a chronically low blood pressure and has been able to tolerate hemodialysis despite significantly low blood  pressure.  Overall, the prognosis remains guarded.  No significant history from the patient.  10/01/2020: Patient seen alongside patient's husband and 2 granddaughters.  Patient underwent hemodialysis yesterday.  Patient is more interactive and communicative today.  Nephrology input is appreciated.  QTC prolongation process.  Patient is on amiodarone.  Assessment & Plan    Principal Problem: Recurrent symptomatic ventricular tachycardia with multiple syncopal episodes ([place]), hypotension -Cardiac cath on 3/13 with findings of new 100% ostial and proximal CTO of SVG RCA. - seen by EP, not a candidate for ICD.  No further invasive options from cardiac standpoint. -Continue aspirin, Plavix, statin -Cardiology following, now transitioned to oral amiodarone -Palliative medicine was consulted, currently goals DNR/DNI, continue full scope medical treatment.  Per nephrology, will dialyze in a.m. if patient is not able to tolerate, will consider transitioning to comfort care -Patient's arterial line was pulled yesterday after decision to transfer to telemetry floor however BP readings much lower.  Patient was placed on midodrine and IV fluid bolus.  Discussed with cardiology, in Cath Lab, her pressures were at least 30 to 40 mm higher with art line, hence has been difficult to monitor her blood pressure without the arterial line now. 10/01/2020: Cardiology input is highly appreciated.  Paroxysmal atrial fibrillation-postop -Received  amiodarone bolus with drip, now transitioned to oral amiodarone -Cardiology following, not a candidate for anticoagulation due to previous brain bleed 09/30/2020: Cardiology input is highly appreciated.  Diabetes mellitus type 2, IDDM, on long-term insulin, with nephropathy -Hemoglobin A1c 5.0 -Continue sliding scale insulin  Essential hypertension BP is not correlating with A-line blood pressure documentation.   Currently on midodrine.   Low blood pressure noted using  conventional of measuring blood pressure  ESRD on hemodialysis, TTS -Nephrology following, plan to dialyze tomorrow and assess if she is able to tolerate.  If patient is not able to tolerate HD, will consider pursuing comfort care.  Palliative medicine is following 09/30/2020: For possible hemodialysis today.  Nephrology input is highly appreciated. 10/01/2020: Patient underwent hemodialysis yesterday.  Anemia of chronic disease -Continue ESA, H&H currently stable 09/30/2020: Hemoglobin is currently at goal.  Transaminitis -Possibly due to #1, hypotension, no acute complaints/abdominal pain -LFTs are improving, continue monitoring  Generalized debility, very deconditioned, FTT -Somewhat somnolent today, increase mobility, PT OT  Obesity Estimated body mass index is 28.61 kg/m as calculated from the following:   Height as of this encounter: 5\' 4"  (1.626 m).   Weight as of this encounter: 75.6 kg.  Code Status: DNR/DNI DVT Prophylaxis:  Heparin subcu  Level of Care: Level of care: Telemetry Cardiac Family Communication: Updated patient's daughter.  Patient's daughter was at bedside.  Disposition Plan:     Status is: Inpatient, transfer to telemetry floor  Remains inpatient appropriate because:Inpatient level of care appropriate due to severity of illness   Dispo: The patient is from: Home              Anticipated d/c is to: TBD              Patient currently is not medically stable to d/c.    Difficult to place patient No  Time Spent in minutes   25 minutes  Procedures:  Left heart catheterization 3/13  Consultants:   Cardiology Palliative medicine Nephrology  Antimicrobials:   Anti-infectives (From admission, onward)   None         Medications  Scheduled Meds: . allopurinol  100 mg Oral q AM  . amiodarone  200 mg Oral BID  . brimonidine  1 drop Right Eye QHS   And  . brimonidine  2 drop Left Eye QHS  . calcium carbonate  1 tablet Oral Q lunch  .  Chlorhexidine Gluconate Cloth  6 each Topical Daily  . Chlorhexidine Gluconate Cloth  6 each Topical Q0600  . Chlorhexidine Gluconate Cloth  6 each Topical Q0600  . cinacalcet  30 mg Oral Once per day on Tue Thu  . clopidogrel  75 mg Oral q AM  . feeding supplement (NEPRO CARB STEADY)  237 mL Oral TID BM  . hydrocortisone   Rectal TID  . hydrocortisone  25 mg Rectal BID  . insulin aspart  0-5 Units Subcutaneous QHS  . insulin aspart  0-6 Units Subcutaneous TID WC  . latanoprost  1 drop Both Eyes QHS  . midodrine  10 mg Oral TID WC  . multivitamin  1 tablet Oral QHS  . polycarbophil  625 mg Oral BID WC  . sevelamer carbonate  1,600 mg Oral BID WC  . sodium chloride flush  3 mL Intravenous Q12H  . sodium chloride flush  3 mL Intravenous Q12H  . timolol  1 drop Left Eye QHS   Continuous Infusions: . sodium chloride    . sodium  chloride    . sodium chloride    . sodium chloride     PRN Meds:.sodium chloride, sodium chloride, sodium chloride, acetaminophen, alteplase, famotidine, heparin, lidocaine (PF), lidocaine-prilocaine, nitroGLYCERIN, oxyCODONE-acetaminophen, pentafluoroprop-tetrafluoroeth, polyvinyl alcohol, sodium chloride flush, witch hazel-glycerin      Subjective:  No new complaints today. No chest pain. No shortness of breath. Patient is more interactive and communicative today.  Objective:   Vitals:   10/01/20 0500 10/01/20 0700 10/01/20 0944 10/01/20 1146  BP: (!) 75/21 (!) 76/21 (!) 88/41 (!) 83/21  Pulse: 68 68 92 (!) 56  Resp: 16 16  17   Temp: 99.2 F (37.3 C) 98.9 F (37.2 C) 99.6 F (37.6 C) 98.8 F (37.1 C)  TempSrc: Oral Oral Oral Oral  SpO2: 97% 98% 96% 94%  Weight: 75.6 kg     Height:        Intake/Output Summary (Last 24 hours) at 10/01/2020 1549 Last data filed at 10/01/2020 0432 Gross per 24 hour  Intake 240 ml  Output 2510 ml  Net -2270 ml     Wt Readings from Last 3 Encounters:  10/01/20 75.6 kg  07/03/20 74.8 kg  05/17/20 77.1  kg   Physical Exam  General: Patient is awake and alert.  Patient is not in any distress.    Cardiovascular: S1 S2   Respiratory: Diminished breath sound    Gastrointestinal: Soft, nontender, nondistended, NBS  Ext: Edema of the extremities.  Neuro: Awake and alert.  Patient moves all extremities.   Data Reviewed:  I have personally reviewed following labs and imaging studies  Micro Results Recent Results (from the past 240 hour(s))  Resp Panel by RT-PCR (Flu A&B, Covid) Nasopharyngeal Swab     Status: None   Collection Time: 09/23/20  7:59 PM   Specimen: Nasopharyngeal Swab; Nasopharyngeal(NP) swabs in vial transport medium  Result Value Ref Range Status   SARS Coronavirus 2 by RT PCR NEGATIVE NEGATIVE Final    Comment: (NOTE) SARS-CoV-2 target nucleic acids are NOT DETECTED.  The SARS-CoV-2 RNA is generally detectable in upper respiratory specimens during the acute phase of infection. The lowest concentration of SARS-CoV-2 viral copies this assay can detect is 138 copies/mL. A negative result does not preclude SARS-Cov-2 infection and should not be used as the sole basis for treatment or other patient management decisions. A negative result may occur with  improper specimen collection/handling, submission of specimen other than nasopharyngeal swab, presence of viral mutation(s) within the areas targeted by this assay, and inadequate number of viral copies(<138 copies/mL). A negative result must be combined with clinical observations, patient history, and epidemiological information. The expected result is Negative.  Fact Sheet for Patients:  EntrepreneurPulse.com.au  Fact Sheet for Healthcare Providers:  IncredibleEmployment.be  This test is no t yet approved or cleared by the [place] FDA and  has been authorized for detection and/or diagnosis of SARS-CoV-2 by FDA under an Emergency Use Authorization (EUA). This EUA will  remain  in effect (meaning this test can be used) for the duration of the COVID-19 declaration under Section 564(b)(1) of the Act, 21 U.S.C.section 360bbb-3(b)(1), unless the authorization is terminated  or revoked sooner.       Influenza A by PCR NEGATIVE NEGATIVE Final   Influenza B by PCR NEGATIVE NEGATIVE Final    Comment: (NOTE) The Xpert Xpress SARS-CoV-2/FLU/RSV plus assay is intended as an aid in the diagnosis of influenza from Nasopharyngeal swab specimens and should not be used as a sole basis for treatment.  Nasal washings and aspirates are unacceptable for Xpert Xpress SARS-CoV-2/FLU/RSV testing.  Fact Sheet for Patients: EntrepreneurPulse.com.au  Fact Sheet for Healthcare Providers: IncredibleEmployment.be  This test is not yet approved or cleared by the [place] FDA and has been authorized for detection and/or diagnosis of SARS-CoV-2 by FDA under an Emergency Use Authorization (EUA). This EUA will remain in effect (meaning this test can be used) for the duration of the COVID-19 declaration under Section 564(b)(1) of the Act, 21 U.S.C. section 360bbb-3(b)(1), unless the authorization is terminated or revoked.  Performed at [facility], [place] [address] [place], [place] [address]   MRSA PCR Screening     Status: None   Collection Time: 09/24/20 12:08 PM   Specimen: Nasopharyngeal  Result Value Ref Range Status   MRSA by PCR NEGATIVE NEGATIVE Final    Comment:        The GeneXpert MRSA Assay (FDA approved for NASAL specimens only), is one component of a comprehensive MRSA colonization surveillance program. It is not intended to diagnose MRSA infection nor to guide or monitor treatment for MRSA infections. Performed at [facility], [place] [address], [place], [place] [address]     Radiology Reports CT Head Wo Contrast  Result Date: 09/23/2020 CLINICAL DATA:  Encephalopathy EXAM: CT HEAD WITHOUT CONTRAST  TECHNIQUE: Contiguous axial images were obtained from the base of the skull through the vertex without intravenous contrast. COMPARISON:  None. FINDINGS: Brain: There is no mass, hemorrhage or extra-axial collection. The size and configuration of the ventricles and extra-axial CSF spaces are normal. There is hypoattenuation of the white matter, most commonly indicating chronic small vessel disease. Vascular: Atherosclerotic calcification of the internal carotid arteries at the skull base. No abnormal hyperdensity of the major intracranial arteries or dural venous sinuses. Skull: The visualized skull base, calvarium and extracranial soft tissues are normal. Sinuses/Orbits: Right maxillary sinus mucosal thickening. The orbits are normal. IMPRESSION: Chronic small vessel disease without acute intracranial abnormality. Electronically Signed   By: Ulyses Jarred M.D.   On: 09/23/2020 20:47   CT ANGIO CHEST PE W OR WO CONTRAST  Result Date: 09/24/2020 CLINICAL DATA:  History of syncope and ventricular tachycardia EXAM: CT ANGIOGRAPHY CHEST WITH CONTRAST TECHNIQUE: Multidetector CT imaging of the chest was performed using the standard protocol during bolus administration of intravenous contrast. Multiplanar CT image reconstructions and MIPs were obtained to evaluate the vascular anatomy. CONTRAST:  77mL OMNIPAQUE IOHEXOL 350 MG/ML SOLN COMPARISON:  09/23/2020 FINDINGS: Cardiovascular: This is a technically adequate opacification of the pulmonary vasculature. No filling defects or pulmonary emboli. The heart is enlarged without pericardial effusion. Normal caliber of the thoracic aorta. Extensive atherosclerosis of the aorta and coronary vasculature. Postsurgical changes from previous CABG. Mediastinum/Nodes: No enlarged mediastinal, hilar, or axillary lymph nodes. Thyroid gland, trachea, and esophagus demonstrate no significant findings. Lungs/Pleura: There are moderate bilateral pleural effusions volume estimated less  than 1 L each. Dense bilateral dependent lower lobe consolidation consistent with atelectasis. No pneumothorax. Central airways are patent. Upper Abdomen: No acute abnormality. Musculoskeletal: There are minimally displaced acute right anterolateral fourth through seventh rib fractures, likely related to recent CPR. Numerous other prior bilateral healed rib fractures are also noted. No other acute bony abnormalities. Reconstructed images demonstrate no additional findings. Review of the MIP images confirms the above findings. IMPRESSION: 1. No evidence of pulmonary embolus. 2. Moderate bilateral pleural effusions with dense bilateral lower lobe atelectasis. 3. Right anterolateral fourth through seventh acute rib fractures consistent with  recent CPR. 4.  Aortic Atherosclerosis (ICD10-I70.0). Electronically Signed   By: Randa Ngo M.D.   On: 09/24/2020 21:17   MR BRAIN WO CONTRAST  Result Date: 09/24/2020 CLINICAL DATA:  26-year-old female with recurrent syncope and confusion. EXAM: MRI HEAD WITHOUT CONTRAST TECHNIQUE: Multiplanar, multiecho pulse sequences of the brain and surrounding structures were obtained without intravenous contrast. COMPARISON:  Head CT 09/23/2020.  Brain MRI 07/13/2015. FINDINGS: Brain: No restricted diffusion to suggest acute infarction. No midline shift, mass effect, evidence of mass lesion, ventriculomegaly, extra-axial collection or acute intracranial hemorrhage. Cervicomedullary junction and pituitary are within normal limits. Mild generalized cerebral volume loss since 2016. But gray and white matter signal remains largely normal for age. No cortical encephalomalacia. But there are occasional chronic micro hemorrhages in the brain, including the posterior right frontal lobe (series 14, image 37) and left cerebellum (image 13). Furthermore, there is intrinsic T1 hyperintensity in the bilateral globus pallidus since 2016 (series 16, image 30 on the left. Vascular: Major intracranial  vascular flow voids are stable since 2016. 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 Mild associated visible cervical spinal stenosis, including at C2-C3. Heterogeneous bone marrow signal but within normal limits. Sinuses/Orbits: Negative orbits. Mild to moderate right maxillary sinus mucosal thickening is new, but other paranasal sinuses are improved compared to 2016. Other: Trace right mastoid fluid.  Negative visible nasopharynx. IMPRESSION: 1. Intrinsic T1 hyperintensity has developed in the bilateral globus pallidus since a 2016 MRI. This is most frequently associated with Hepatic Insufficiency / Cirrhosis. Correlation with serum Ammonia levels and liver function tests is recommended. 2. No other acute intracranial abnormality. Mild generalized cerebral volume loss since 2016 with occasional chronic micro hemorrhages in the brain. 3. 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 Electronically Signed   By: Genevie Ann M.D.   On: 09/24/2020 11:34   CARDIAC CATHETERIZATION  Result Date: 09/24/2020  -----------NATIVE VESSELS-------------------------  Ost LAD to Prox LAD lesion is 95% stenosed. Prox LAD lesion is 100% stenosed with 100% stenosed side branch in Ost 1st Diag.  Ost 1st Mrg lesion is 75% stenosed.  Prox Cx to Mid Cx lesion is 100% stenosed (just after 1st Mrg) with side branch in Ost 2nd Mrg.  Prox RCA lesion is 95% stenosed. Mid RCA to Dist RCA lesion is 100% stenosed.  ------------GRAFTS--------------------------  SVG-RCA graft was visualized by angiography and is small. Origin to Mid Graft lesion is 100% stenosed. - Previously stented Graft  SVG-1stDiag graft was visualized by angiography. Dist Graft to Insertion lesion is 55% stenosed.  LIMA-dLAD graft was visualized by angiography and is normal in caliber. The graft exhibits no disease. There is no  competitive flow  ---------------------------  There is mild left ventricular systolic dysfunction. The left ventricular ejection fraction is 45-50% by visual estimate.  LV end diastolic pressure is mildly elevated.  There is a significant difference in arterial pressures versus cuff pressures-arterial pressures are at least 30- 40 mmHg higher.  SUMMARY  Severe native vessel CAD:  100% LAD following a 95% calcified lesion prior to SP1;  100% mid RCA following severe 80% stenosis,  100% AV groove LCx after OM1 with bridging collaterals to small PL branch,  75 to 80% calcified ostial OM1 (large bifurcating ramus-like vessel)  Grafts:  New lesion: 100% ostial and proximal CTO of SVG-RCA (was a very small caliber vessel previously stented.  The nature of this occlusion is such  that it does not appear to be acute-the patient not actively having chest pain and no ST elevations, minimal troponin. => Likely not a reasonable target for PCI  SVG- 1st Diag has an anastomotic 50 to 60% stenosis of questionable significance (at this stage, the graft is the same caliber as a target vessel) ->  I chose not to attempt intervention on this location as she is hemodynamically clinically stable at this point. Would not want to complicate issues by potentially occluding another graft.  Patent LIMA-LAD with no left-to-right collaterals  Mildly reduced EF with inferobasal hypokinesis-45 to 50%.  Borderline elevated LVEDP of 15 mmHg  Significant discrepancy between aortic blood pressures and cuff blood pressures of at least 30 mmHg.  Cuff pressures are significantly lower. RECOMMENDATIONS  Return to CCU, would continue amiodarone load, consider lidocaine if VT recurs.  EP consultation to consider the possibility of ICD.  For future stabilization, could consider the possibility of intervention SVG-1st Diag (albeit a questionable significance), also atherectomy PCI of the OM1 (this is similar in appearance to previous  cath in 2018, so this would only be to optimize distal flow - would likely need ischemic evaluation prior to proceeding with PCI on either lesion).  Prior to initiating pressor support in the ICU, would consider invasive arterial monitoring   DG Chest Port 1 View  Result Date: 09/23/2020 CLINICAL DATA:  Ventricular tachycardia, cardiac arrest status post resuscitation EXAM: PORTABLE CHEST 1 VIEW COMPARISON:  04/10/2018 FINDINGS: Single frontal view of the chest demonstrates stable enlargement the cardiac silhouette, with postsurgical changes from median sternotomy and bypass surgery. Retrocardiac consolidation may reflect atelectasis, infection, or aspiration. Small left pleural effusion. No pneumothorax. No acute displaced fractures. Vascular stent left axillary region. IMPRESSION: 1. Left lower lobe consolidation may reflect atelectasis, infection, or aspiration. 2. Small left pleural effusion. Electronically Signed   By: Randa Ngo M.D.   On: 09/23/2020 19:21   ECHOCARDIOGRAM COMPLETE  Result Date: 09/24/2020    ECHOCARDIOGRAM REPORT   Patient Name:   Chanin MAE Frisk Date of Exam: 09/24/2020 Medical Rec #:  [id number]         Height:       64.0 in Accession #:    [id number]        Weight:       170.0 lb Date of Birth:  11-12-1940          BSA:          1.826 m Patient Age:    79 years          BP:           84/65 mmHg Patient Gender: F                 HR:           52 bpm. Exam Location:  Inpatient Procedure: 2D Echo Indications:    cardiac arrest  History:        Patient has prior history of Echocardiogram examinations, most                 recent 05/31/2020. CAD, Prior CABG, end stage renal disease,                 Arrythmias:ventricular tachycardia; Signs/Symptoms:Syncope.  Sonographer:    Johny Chess Referring Phys: [place]  1. Patient had VT/VF arrest within minutes of starting echocardiogram. Remainder of study completed post cath, post arrest.  2. Left  ventricular ejection fraction,  by estimation, is 45%. The left ventricle has mildly decreased function. The left ventricle demonstrates regional wall motion abnormalities (see scoring diagram/findings for description). Left ventricular diastolic  parameters are consistent with Grade II diastolic dysfunction (pseudonormalization).  3. Right ventricular systolic function is severely reduced. The right ventricular size is moderately enlarged. There is normal pulmonary artery systolic pressure. The estimated right ventricular systolic pressure is 31.5 mmHg.  4. Left atrial size was moderately dilated.  5. Right atrial size was moderately dilated.  6. The mitral valve is degenerative. Mild mitral valve regurgitation. No evidence of mitral stenosis.  7. The aortic valve is grossly normal. There is mild thickening of the aortic valve. Aortic valve regurgitation is not visualized. No aortic stenosis is present.  8. The inferior vena cava is normal in size with <50% respiratory variability, suggesting right atrial pressure of 8 mmHg. FINDINGS  Left Ventricle: Left ventricular ejection fraction, by estimation, is 45%. The left ventricle has mildly decreased function. The left ventricle demonstrates regional wall motion abnormalities. The left ventricular internal cavity size was normal in size. There is no left ventricular hypertrophy. Left ventricular diastolic parameters are consistent with Grade II diastolic dysfunction (pseudonormalization).  LV Wall Scoring: The basal inferolateral segment and basal inferior segment are akinetic. Right Ventricle: The right ventricular size is moderately enlarged. Right vetricular wall thickness was not well visualized. Right ventricular systolic function is severely reduced. There is normal pulmonary artery systolic pressure. The tricuspid regurgitant velocity is 2.27 m/s, and with an assumed right atrial pressure of 8 mmHg, the estimated right ventricular systolic pressure is 40.0 mmHg.  Left Atrium: Left atrial size was moderately dilated. Right Atrium: Right atrial size was moderately dilated. Pericardium: There is no evidence of pericardial effusion. Mitral Valve: The mitral valve is degenerative in appearance. Mild mitral annular calcification. Mild mitral valve regurgitation. No evidence of mitral valve stenosis. Tricuspid Valve: The tricuspid valve is normal in structure. Tricuspid valve regurgitation is mild. Aortic Valve: The aortic valve is grossly normal. There is mild thickening of the aortic valve. Aortic valve regurgitation is not visualized. No aortic stenosis is present. Pulmonic Valve: The pulmonic valve was not well visualized. Pulmonic valve regurgitation is trivial. Aorta: The aortic root is normal in size and structure. Venous: The inferior vena cava is normal in size with less than 50% respiratory variability, suggesting right atrial pressure of 8 mmHg. IAS/Shunts: No atrial level shunt detected by color flow Doppler.  LEFT VENTRICLE PLAX 2D LVIDd:         4.40 cm Diastology LVIDs:         3.60 cm LV e' medial:    5.77 cm/s LV PW:         0.90 cm LV E/e' medial:  24.4 LV IVS:        0.70 cm LV e' lateral:   7.51 cm/s                        LV E/e' lateral: 18.8  RIGHT VENTRICLE            IVC RV S prime:     4.35 cm/s  IVC diam: 1.80 cm LEFT ATRIUM             Index       RIGHT ATRIUM           Index LA diam:        3.60 cm 1.97 cm/m  RA Area:  16.20 cm LA Vol (A2C):   71.5 ml 39.16 ml/m RA Volume:   41.10 ml  22.51 ml/m LA Vol (A4C):   34.5 ml 18.90 ml/m LA Biplane Vol: 51.6 ml 28.26 ml/m  AORTIC VALVE LVOT Vmax:   84.90 cm/s LVOT Vmean:  57.800 cm/s LVOT VTI:    0.221 m  AORTA Ao Asc diam: 2.30 cm MITRAL VALVE                TRICUSPID VALVE MV Area (PHT): 2.87 cm     TR Peak grad:   20.6 mmHg MV Decel Time: 264 msec     TR Vmax:        227.00 cm/s MV E velocity: 141.00 cm/s MV A velocity: 80.80 cm/s   SHUNTS MV E/A ratio:  1.75         Systemic VTI: 0.22 m Cherlynn Kaiser MD Electronically signed by Cherlynn Kaiser MD Signature Date/Time: 09/24/2020/4:44:35 PM    Final     Lab Data:  CBC: Recent Labs  Lab 09/25/20 0424 09/26/20 1250 10/01/20 0327  WBC 8.1 7.5 10.1  HGB 10.3* 10.2* 10.0*  HCT 33.7* 33.2* 31.8*  MCV 109.1* 107.8* 105.6*  PLT 129* 124* 662   Basic Metabolic Panel: Recent Labs  Lab 09/25/20 0424 09/26/20 0513 09/27/20 0110 09/28/20 0358 09/30/20 1421 10/01/20 0327  NA 133* 133* 136 135  --  130*  K 4.6 4.5 3.8 3.8  --  4.0  CL 90* 88* 96* 94*  --  92*  CO2 30 31 28 29   --  26  GLUCOSE 194* 178* 170* 166*  --  169*  BUN 19 29* 15 33*  --  44*  CREATININE 4.46* 5.62* 3.48* 5.34*  --  5.90*  CALCIUM 8.5* 8.7* 8.6* 8.4*  --  8.7*  MG 2.7*  --  2.1  --  2.1  --   PHOS  --  4.8*  --   --   --   --    GFR: Estimated Creatinine Clearance: 7.6 mL/min (A) (by C-G formula based on SCr of 5.9 mg/dL (H)). Liver Function Tests: Recent Labs  Lab 09/26/20 0513 09/27/20 0110 09/28/20 0358  AST  --  139* 53*  ALT  --  143* 107*  ALKPHOS  --  66 70  BILITOT  --  0.7 0.7  PROT  --  5.7* 5.4*  ALBUMIN 2.6* 2.5* 2.5*   No results for input(s): LIPASE, AMYLASE in the last 168 hours. Recent Labs  Lab 09/26/20 0513  AMMONIA 36*   Coagulation Profile: No results for input(s): INR, PROTIME in the last 168 hours. Cardiac Enzymes: No results for input(s): CKTOTAL, CKMB, CKMBINDEX, TROPONINI in the last 168 hours. BNP (last 3 results) No results for input(s): PROBNP in the last 8760 hours. HbA1C: No results for input(s): HGBA1C in the last 72 hours. CBG: Recent Labs  Lab 09/30/20 1156 09/30/20 1623 09/30/20 2216 10/01/20 0703 10/01/20 1158  GLUCAP 234* 237* 188* 141* 193*   Lipid Profile: No results for input(s): CHOL, HDL, LDLCALC, TRIG, CHOLHDL, LDLDIRECT in the last 72 hours. Thyroid Function Tests: No results for input(s): TSH, T4TOTAL, FREET4, T3FREE, THYROIDAB in the last 72 hours. Anemia Panel: No results  for input(s): VITAMINB12, FOLATE, FERRITIN, TIBC, IRON, RETICCTPCT in the last 72 hours. Urine analysis:    Component Value Date/Time   COLORURINE YELLOW 12/21/2015 2222   APPEARANCEUR CLEAR 12/21/2015 2222   LABSPEC 1.013 12/21/2015 2222   PHURINE 6.5 12/21/2015 2222  GLUCOSEU NEGATIVE 12/21/2015 2222   HGBUR MODERATE (A) 12/21/2015 2222   BILIRUBINUR NEGATIVE 12/21/2015 2222   KETONESUR NEGATIVE 12/21/2015 2222   PROTEINUR >300 (A) 12/21/2015 2222   UROBILINOGEN 0.2 04/19/2015 1248   NITRITE POSITIVE (A) 12/21/2015 2222   LEUKOCYTESUR TRACE (A) 12/21/2015 2222     Bonnell Public M.D. [facility] 10/01/2020, 3:49 PM  Available via Epic secure chat 7am-7pm After 7 pm, please refer to night coverage provider listed on amion.

## 2020-10-01 NOTE — Consult Note (Signed)
[facility] Nurse Consult Note: Reason for Consult: Consult performed remotely after review of the progress notes. Pt was noted to have a deep tissue pressure injury (DTPI) to the left buttock; dark purple-red according to the nursing flow-sheet.   Pressure Injury POA: No Dressing procedure/placement/frequency:  Topical treatment orders provided for bedside nurses to apply foam dressings to protect from further injury. Deep tissue pressure injuries are high risk to evolve into full thickness tissue loss within 7-10 days; [place] team will plan to reassess the location weekly to determine if a change in the plan of care is indicated at that time.  Julien Girt MSN, RN, [place], [place], [place] team

## 2020-10-02 DIAGNOSIS — I472 Ventricular tachycardia: Secondary | ICD-10-CM | POA: Diagnosis not present

## 2020-10-02 LAB — GLUCOSE, CAPILLARY
Glucose-Capillary: 189 mg/dL — ABNORMAL HIGH (ref 70–99)
Glucose-Capillary: 201 mg/dL — ABNORMAL HIGH (ref 70–99)
Glucose-Capillary: 174 mg/dL — ABNORMAL HIGH (ref 70–99)

## 2020-10-02 MED ORDER — DRONABINOL 2.5 MG PO CAPS
5.0000 mg | ORAL_CAPSULE | Freq: Two times a day (BID) | ORAL | Status: DC
  Administered 2020-10-02 – 2020-10-06 (×8): 5 mg via ORAL
  Filled 2020-10-02 (×8): qty 2

## 2020-10-02 NOTE — Progress Notes (Addendum)
[facility] Progress Note  Subjective:  Not making sense this am, per RN's.  Not talking back to me.     Vitals:   10/02/20 0336 10/02/20 0409 10/02/20 0700 10/02/20 1209  BP: (!) 95/21  (!) 96/42 (!) 101/37  Pulse: (!) 54  (!) 51 (!) 52  Resp: 17  16 16   Temp: 98.8 F (37.1 C)  97.9 F (36.6 C) (!) 97.4 F (36.3 C)  TempSrc: Oral  Oral Oral  SpO2: 97%  99% 97%  Weight:  76.6 kg    Height:        Exam General: Chronically ill appearing female, nad, lethargic Heart: RRR, no murmur Lungs: CTA anteriorly without wheezing or rales Abdomen: Soft, non-distended, +BS. Extremities: No edema b/l lower extremities Dialysis Access: LUE AVF + bruit; maturing RUA AVF+bruit     OP HD: [facility] TTS  4h  76.5kg  3K/Ca 2.5 bath  Hep none  LUE AVF - 100 mircera q 2 wks, last 3/08     Assessment/ Plan: 1. Vtach/ CAD/ hx of CABG: cath 09/25/31 with severe native disease and new 100% ostial + prox CTO of SVG RCA. Seen by EP, not ICD candidate. Rx'ing medically , on po amiodarone.  2. ESRD - on HD TTS. Palliative care discussions ongoing.  Next HD Tuesday. 3. HD access: due to mild steal symptoms in L hand, VVS placed new RUA BC AVF on 07/03/20, with the idea of ligating the L arm AVF when the R one is being used successfully.  4. Anemia: Hgb 10.2 on 3/15. Hgb stable.  Has hemorrhoids 5. CKD-MBD: Checking RFP, continue binders and vits 6. Nutrition: protein supps 7. Hypotension: BP's 80's- 100's. Midodrine started here, getting 10mg  tid now.  8. DM2- on insulin, per pmd 9. Dispo: pending     [name] 10/02/2020, 12:37 PM   Recent Labs  Lab 09/26/20 0513 09/26/20 1250 09/27/20 0110 09/28/20 0358 10/01/20 0327  K 4.5  --    < > 3.8 4.0  BUN 29*  --    < > 33* 44*  CREATININE 5.62*  --    < > 5.34* 5.90*  CALCIUM 8.7*  --    < > 8.4* 8.7*  PHOS 4.8*  --   --   --   --   HGB  --  10.2*  --   --  10.0*   < > = values in this interval not displayed.   Inpatient  medications: . acetaminophen  650 mg Oral TID  . allopurinol  100 mg Oral q AM  . amiodarone  200 mg Oral BID  . brimonidine  1 drop Right Eye QHS   And  . brimonidine  2 drop Left Eye QHS  . calcium carbonate  1 tablet Oral Q lunch  . Chlorhexidine Gluconate Cloth  6 each Topical Q0600  . cinacalcet  30 mg Oral Once per day on Tue Thu  . clopidogrel  75 mg Oral q AM  . dronabinol  5 mg Oral BID AC  . feeding supplement (NEPRO CARB STEADY)  237 mL Oral TID BM  . hydrocortisone   Rectal TID  . hydrocortisone  25 mg Rectal BID  . insulin aspart  0-5 Units Subcutaneous QHS  . insulin aspart  0-6 Units Subcutaneous TID WC  . latanoprost  1 drop Both Eyes QHS  . midodrine  10 mg Oral TID WC  . multivitamin  1 tablet Oral QHS  . polycarbophil  625  mg Oral BID WC  . sevelamer carbonate  1,600 mg Oral BID WC  . sodium chloride flush  3 mL Intravenous Q12H  . timolol  1 drop Left Eye QHS   . sodium chloride    . sodium chloride    . sodium chloride     sodium chloride, sodium chloride, acetaminophen, alteplase, famotidine, heparin, lidocaine (PF), lidocaine-prilocaine, nitroGLYCERIN, oxyCODONE-acetaminophen, pentafluoroprop-tetrafluoroeth, polyvinyl alcohol, witch hazel-glycerin

## 2020-10-02 NOTE — Progress Notes (Incomplete)
Pharmacy Rounding Learning Note - not an active part of the chart  PMH: ESRD, CAD, Afib, T2DM, HTN, HLD   CC/HPI: Pt brought into ED from home on 3/12. Pt had dialysis earlier that day and did not feel well. Pt was found with blood in her mouth and was not breathing. Pt was in v tach in route to hospital and was converted and became responsive.    Drug Allergies: gabapentin, sulfa antibiotics, almodipine    Problem 1: Recurrent symptomatic v tach with multiple syncopal episodes  - cath on 3/13 showed severe native vessel CAD - pt is not a candidate for ICD, no further invasive options from cardiology  - currently taking clopidogrel - palliative medicine consulted and cardiology following    Problem 2: Afib - pt received amiodarone IV and now on oral amiodarone  - pt not a candidate for anticoagulation d/t previous brain bleed    Problem 3: Hypotension - systolic BP higher today  - pt currently on midodrine    Problem 4: T2DM - A1c 5.0% on 3/13 - glucose remains elevated  - pt currently on Novolog SSI with meals and at bedtime   Renal:  Cause of AKI: N/A, ESRD  SCr: N/A  Dialysis:   Type: HD    Schedule: TTS   Access type: AV fistula left upper arm   Days:  3/17: 400 mL/min, 4 hours 3/20: 400 mL/min, 4 hours 3/22: 350 to 400 mL/min, 4 hours 3/24: 400 mL/min, 3.5 hours  Tolerating well   Urine output: N/A  Electrolytes: 3/25  Sodium: 134 (low)  Potassium: 3.4  Calcium: 9.0, CC 10.2  Magnesium: 2.1 (3/22)  Phosphorus: 3.3 (3/22)  Hgb: 12.3 (3/25) Ferritin: N/A Iron saturation: N/A  Renal meds:  - cinacalcet (Sensipar)  - Rena-Vit - sevelamer carbonate (Renvela)     Plan:  - Palliative care is consulting with pt and family about establishing goals of care (day to day basis) - Pt's ultimate goal is to see Granddaughter's wedding in April  - Continue full scope medical treatment - will discuss with family about discontinuing Marinol since it is  happening  - BP low, but last checked was 142/47 - pt still on midodrine  - continue to monitor BP  - glucose levels remain slightly elevated, below 180 today (3/25) - pt on Novolog SSI - continue to monitor glucose   - WBC increased, so she may have aspirated  - do we need to start something for aspiration pneumonia? Follow trend of WBC   - dietician noted pt eating less than 25% of meals - pt family does not wish to pursue artificial feedings  - wishes to continue with aggressive medical care  - MD noted family understands that she is approaching end-of-life - she is not overall improving   - continue to provide dialysis as pt can tolerate - pt will need to be able to sit in chair for 4-6 hours for dialysis to be able to d/c home or to SNF - no hemodialysis today 3/25, tolerating  - continue to monitor electrolytes and hemoglobin

## 2020-10-02 NOTE — Care Management Important Message (Signed)
Important Message  Patient Details  Name: [name] MRN: [id number] Date of Birth: 1940/08/18   Medicare Important Message Given:  Yes     Shelda Altes 10/02/2020, 11:31 AM

## 2020-10-02 NOTE — Progress Notes (Signed)
PROGRESS NOTE    [name]  MPN:[id number] DOB: Oct 14, 1940 DOA: 09/23/2020 PCP: Josetta Huddle, MD    Brief Narrative:  This 80 y.o.femalewith medical history significant ofend-stage renal disease on hemodialysis TTS, coronary artery disease, carotid stenosis, atrial fibrillation, previous history of nonsustained V. tach, diabetes, essential hypertension, hyperlipidemia, hard of hearing who presented with multiple episodes of syncope prior to admission, later found to have vtach by EMS. Pt was admitted for further work up.  Patient was scheduled to undergo left heart cath on 3/14 when she sustained an episode of symptomatic V. Tach (code blue), requiring brief chest compressions with ROSC. Later she underwent emergent cath on 3/13 with new 100% ostial and proximal CTO of SVG RCA.  EP was consulted, not a candidate for ICD.  CT angio showed no PE.  Patient continued to have several episodes of nonsustained V. tach or ventricular bigeminy, hypoxia. Cardiology recommended amiodarone, no beta-blocker due to bradycardia and amiodarone. Palliative medicine following, goals of care discussed, DNR/DNI, for now continue full scope medical treatment.  Nephrology following for hemodialysis.  Assessment & Plan:   Principal Problem:   Ventricular tachycardia ([place]) Active Problems:   Essential hypertension   Hyperlipidemia associated with type 2 diabetes mellitus (HCC)   Paroxysmal atrial fibrillation (HCC)   Coronary artery disease involving native heart without angina pectoris   Hypokalemia   QT prolongation   ESRD on dialysis (HCC)   Anemia of chronic disease   Diabetes mellitus type 2 in nonobese [facility])   Syncope and collapse   Coronary artery disease involving autologous vein bypass graft   Demand ischemia of myocardium (HCC)   Pressure injury of skin   Recurrent symptomatic ventricular tachycardia with multiple syncopal episodes ([place]), hypotension -Cardiac cath on 3/13 with findings  of new 100% ostial and proximal CTO of SVG RCA. -Seen by EP, not a candidate for ICD.  No further invasive intervention from cardiac standpoint. -Continue aspirin, Plavix, statin -Cardiology following, now transitioned to oral amiodarone. -Palliative medicine was consulted, currently goals DNR/DNI, continue full scope medical treatment.   -Per nephrology, will dialyze in a.m. if patient is not able to tolerate, will consider transitioning to comfort care. -Patient's arterial line was pulled yesterday after decision to transfer to telemetry floor however BP readings much lower.  Patient was placed on midodrine and IV fluid bolus.  Discussed with cardiology, in Cath Lab, her pressures were at least 30 to 40 mm higher with arterial line, hence has been difficult to monitor her blood pressure without the arterial line now. 10/01/2020: Cardiology input is highly appreciated.  Paroxysmal atrial fibrillation-postop -Received amiodarone bolus with drip, now transitioned to oral amiodarone. -Cardiology following, not a candidate for anticoagulation due to previous brain bleed.   Diabetes mellitus type 2, IDDM, with nephropathy -Hemoglobin A1c 5.0 -Continue sliding scale insulin  Essential hypertension: BP is not correlating with A-line blood pressure documentation.   Currently on midodrine.  Improving. Low blood pressure noted using conventional of measuring blood pressure  ESRD on hemodialysis, TTS -Nephrology following, plan to dialyze tomorrow and assess if she is able to tolerate.  If patient is not able to tolerate HD, will consider pursuing comfort care.  Palliative medicine is following 10/01/2020: Patient underwent hemodialysis. Next HD 3/22.  Anemia of chronic disease -Continue ESA, H&H currently stable.  Hemoglobin is currently at goal.  Transaminitis -Possibly due to #1, hypotension, no acute complaints/abdominal pain -LFTs are improving, continue monitoring  Generalized  debility, very deconditioned, FTT -very  somnolent today, increase mobility, PT OT  Obesity Estimated body mass index is 28.61 kg/m as calculated from the following:   Height as of this encounter: 5\' 4"  (1.626 m).   Weight as of this encounter: 75.6 kg.   DVT prophylaxis: heparin sq Code Status: DNR Family Communication: No family at bed side. Disposition Plan:  Status is: Inpatient  Remains inpatient appropriate because:Inpatient level of care appropriate due to severity of illness   Dispo: The patient is from: Home              Anticipated d/c is to: TBD              Patient currently is not medically stable to d/c.   Difficult to place patient No   Consultants:    Nephrology  Procedures: [facility] 3/13  Antimicrobials:   Anti-infectives (From admission, onward)   None      Subjective: Patient was seen and examined at bedside.  Overnight events noted.   She is alert but confused,  not interactive and communicative. She opens eyes and goes back to sleep.  Objective: Vitals:   10/02/20 0336 10/02/20 0409 10/02/20 0700 10/02/20 1209  BP: (!) 95/21  (!) 96/42 (!) 101/37  Pulse: (!) 54  (!) 51 (!) 52  Resp: 17  16 16   Temp: 98.8 F (37.1 C)  97.9 F (36.6 C) (!) 97.4 F (36.3 C)  TempSrc: Oral  Oral Oral  SpO2: 97%  99% 97%  Weight:  76.6 kg    Height:        Intake/Output Summary (Last 24 hours) at 10/02/2020 1531 Last data filed at 10/02/2020 0600 Gross per 24 hour  Intake 360 ml  Output --  Net 360 ml   Filed Weights   09/30/20 0639 10/01/20 0500 10/02/20 0409  Weight: 82.4 kg 75.6 kg 76.6 kg    Examination:  General exam: Appears calm and comfortable, not in any acute distress. Respiratory system: Clear to auscultation. Respiratory effort normal. Cardiovascular system: S1 & S2 heard, RRR. No JVD, murmurs, rubs, gallops or clicks. No pedal edema. Gastrointestinal system: Abdomen is nondistended, soft and nontender. No organomegaly or masses felt.   Normal bowel sounds heard. Central nervous system: Alert and oriented. No focal neurological deficits. Extremities: Symmetric 5 x 5 power. Skin: No rashes, lesions or ulcers Psychiatry: Judgement and insight appear normal. Mood & affect appropriate.     Data Reviewed: I have personally reviewed following labs and imaging studies  CBC: Recent Labs  Lab 09/26/20 1250 10/01/20 0327  WBC 7.5 10.1  HGB 10.2* 10.0*  HCT 33.2* 31.8*  MCV 107.8* 105.6*  PLT 124* 381   Basic Metabolic Panel: Recent Labs  Lab 09/26/20 0513 09/27/20 0110 09/28/20 0358 09/30/20 1421 10/01/20 0327  NA 133* 136 135  --  130*  K 4.5 3.8 3.8  --  4.0  CL 88* 96* 94*  --  92*  CO2 31 28 29   --  26  GLUCOSE 178* 170* 166*  --  169*  BUN 29* 15 33*  --  44*  CREATININE 5.62* 3.48* 5.34*  --  5.90*  CALCIUM 8.7* 8.6* 8.4*  --  8.7*  MG  --  2.1  --  2.1  --   PHOS 4.8*  --   --   --   --    GFR: Estimated Creatinine Clearance: 7.6 mL/min (A) (by C-G formula based on SCr of 5.9 mg/dL (H)). Liver Function Tests: Recent Labs  Lab 09/26/20 0513 09/27/20 0110 09/28/20 0358  AST  --  139* 53*  ALT  --  143* 107*  ALKPHOS  --  66 70  BILITOT  --  0.7 0.7  PROT  --  5.7* 5.4*  ALBUMIN 2.6* 2.5* 2.5*   No results for input(s): LIPASE, AMYLASE in the last 168 hours. Recent Labs  Lab 09/26/20 0513  AMMONIA 36*   Coagulation Profile: No results for input(s): INR, PROTIME in the last 168 hours. Cardiac Enzymes: No results for input(s): CKTOTAL, CKMB, CKMBINDEX, TROPONINI in the last 168 hours. BNP (last 3 results) No results for input(s): PROBNP in the last 8760 hours. HbA1C: No results for input(s): HGBA1C in the last 72 hours. CBG: Recent Labs  Lab 10/01/20 0703 10/01/20 1158 10/01/20 1655 10/01/20 2155 10/02/20 0610  GLUCAP 141* 193* 202* 197* 189*   Lipid Profile: No results for input(s): CHOL, HDL, LDLCALC, TRIG, CHOLHDL, LDLDIRECT in the last 72 hours. Thyroid Function  Tests: No results for input(s): TSH, T4TOTAL, FREET4, T3FREE, THYROIDAB in the last 72 hours. Anemia Panel: No results for input(s): VITAMINB12, FOLATE, FERRITIN, TIBC, IRON, RETICCTPCT in the last 72 hours. Sepsis Labs: No results for input(s): PROCALCITON, LATICACIDVEN in the last 168 hours.  Recent Results (from the past 240 hour(s))  Resp Panel by RT-PCR (Flu A&B, Covid) Nasopharyngeal Swab     Status: None   Collection Time: 09/23/20  7:59 PM   Specimen: Nasopharyngeal Swab; Nasopharyngeal(NP) swabs in vial transport medium  Result Value Ref Range Status   SARS Coronavirus 2 by RT PCR NEGATIVE NEGATIVE Final    Comment: (NOTE) SARS-CoV-2 target nucleic acids are NOT DETECTED.  The SARS-CoV-2 RNA is generally detectable in upper respiratory specimens during the acute phase of infection. The lowest concentration of SARS-CoV-2 viral copies this assay can detect is 138 copies/mL. A negative result does not preclude SARS-Cov-2 infection and should not be used as the sole basis for treatment or other patient management decisions. A negative result may occur with  improper specimen collection/handling, submission of specimen other than nasopharyngeal swab, presence of viral mutation(s) within the areas targeted by this assay, and inadequate number of viral copies(<138 copies/mL). A negative result must be combined with clinical observations, patient history, and epidemiological information. The expected result is Negative.  Fact Sheet for Patients:  EntrepreneurPulse.com.au  Fact Sheet for Healthcare Providers:  IncredibleEmployment.be  This test is no t yet approved or cleared by the [place] FDA and  has been authorized for detection and/or diagnosis of SARS-CoV-2 by FDA under an Emergency Use Authorization (EUA). This EUA will remain  in effect (meaning this test can be used) for the duration of the COVID-19 declaration under Section  564(b)(1) of the Act, 21 U.S.C.section 360bbb-3(b)(1), unless the authorization is terminated  or revoked sooner.       Influenza A by PCR NEGATIVE NEGATIVE Final   Influenza B by PCR NEGATIVE NEGATIVE Final    Comment: (NOTE) The Xpert Xpress SARS-CoV-2/FLU/RSV plus assay is intended as an aid in the diagnosis of influenza from Nasopharyngeal swab specimens and should not be used as a sole basis for treatment. Nasal washings and aspirates are unacceptable for Xpert Xpress SARS-CoV-2/FLU/RSV testing.  Fact Sheet for Patients: EntrepreneurPulse.com.au  Fact Sheet for Healthcare Providers: IncredibleEmployment.be  This test is not yet approved or cleared by the [place] FDA and has been authorized for detection and/or diagnosis of SARS-CoV-2 by FDA under an Emergency Use Authorization (EUA). This EUA will remain  in effect (meaning this test can be used) for the duration of the COVID-19 declaration under Section 564(b)(1) of the Act, 21 U.S.C. section 360bbb-3(b)(1), unless the authorization is terminated or revoked.  Performed at [facility], [place] [address], [place], [place] [address]   MRSA PCR Screening     Status: None   Collection Time: 09/24/20 12:08 PM   Specimen: Nasopharyngeal  Result Value Ref Range Status   MRSA by PCR NEGATIVE NEGATIVE Final    Comment:        The GeneXpert MRSA Assay (FDA approved for NASAL specimens only), is one component of a comprehensive MRSA colonization surveillance program. It is not intended to diagnose MRSA infection nor to guide or monitor treatment for MRSA infections. Performed at [facility], [place] [address], [place], [place] [address]     Radiology Studies: No results found.  Scheduled Meds: . acetaminophen  650 mg Oral TID  . allopurinol  100 mg Oral q AM  . amiodarone  200 mg Oral BID  . brimonidine  1 drop Right Eye QHS   And  . brimonidine  2 drop Left Eye QHS   . calcium carbonate  1 tablet Oral Q lunch  . Chlorhexidine Gluconate Cloth  6 each Topical Q0600  . cinacalcet  30 mg Oral Once per day on Tue Thu  . clopidogrel  75 mg Oral q AM  . dronabinol  5 mg Oral BID AC  . feeding supplement (NEPRO CARB STEADY)  237 mL Oral TID BM  . hydrocortisone   Rectal TID  . hydrocortisone  25 mg Rectal BID  . insulin aspart  0-5 Units Subcutaneous QHS  . insulin aspart  0-6 Units Subcutaneous TID WC  . latanoprost  1 drop Both Eyes QHS  . midodrine  10 mg Oral TID WC  . multivitamin  1 tablet Oral QHS  . polycarbophil  625 mg Oral BID WC  . sevelamer carbonate  1,600 mg Oral BID WC  . sodium chloride flush  3 mL Intravenous Q12H  . timolol  1 drop Left Eye QHS   Continuous Infusions: . sodium chloride    . sodium chloride    . sodium chloride       LOS: 9 days    Time spent: 35 mins    Morris Longenecker, MD [facility]   If 7PM-7AM, please contact night-coverage

## 2020-10-02 NOTE — Progress Notes (Signed)
When gave Annusol HC supp. Earlier [employer]. Patient has stool in rectum . Patient needs something for disimpaction.

## 2020-10-02 NOTE — Progress Notes (Addendum)
Pt refused to take am meds. Pt had requested pain medication, but then would not take it. Pt was educated on importance of taking meds. Pt told staff to leave her alone. Pt's daughter called, per pt request. Daughter unable to convince pt to take meds. MD updated. Will continue current plan of care.

## 2020-10-03 LAB — CBC
HCT: 34.1 % — ABNORMAL LOW (ref 36.0–46.0)
Hemoglobin: 11.4 g/dL — ABNORMAL LOW (ref 12.0–15.0)
MCH: 33.1 pg (ref 26.0–34.0)
MCHC: 33.4 g/dL (ref 30.0–36.0)
MCV: 99.1 fL (ref 80.0–100.0)
Platelets: 209 10*3/uL (ref 150–400)
RBC: 3.44 MIL/uL — ABNORMAL LOW (ref 3.87–5.11)
RDW: 15.5 % (ref 11.5–15.5)
WBC: 10.4 10*3/uL (ref 4.0–10.5)
nRBC: 0 % (ref 0.0–0.2)

## 2020-10-03 LAB — GLUCOSE, CAPILLARY
Glucose-Capillary: 125 mg/dL — ABNORMAL HIGH (ref 70–99)
Glucose-Capillary: 138 mg/dL — ABNORMAL HIGH (ref 70–99)
Glucose-Capillary: 191 mg/dL — ABNORMAL HIGH (ref 70–99)
Glucose-Capillary: 181 mg/dL — ABNORMAL HIGH (ref 70–99)

## 2020-10-03 LAB — COMPREHENSIVE METABOLIC PANEL
ALT: 29 U/L (ref 0–44)
AST: 20 U/L (ref 15–41)
Albumin: 2.3 g/dL — ABNORMAL LOW (ref 3.5–5.0)
Alkaline Phosphatase: 81 U/L (ref 38–126)
Anion gap: 12 (ref 5–15)
BUN: 44 mg/dL — ABNORMAL HIGH (ref 8–23)
CO2: 24 mmol/L (ref 22–32)
Calcium: 9.1 mg/dL (ref 8.9–10.3)
Chloride: 95 mmol/L — ABNORMAL LOW (ref 98–111)
Creatinine, Ser: 5.38 mg/dL — ABNORMAL HIGH (ref 0.44–1.00)
GFR, Estimated: 8 mL/min — ABNORMAL LOW (ref >60)
Glucose, Bld: 174 mg/dL — ABNORMAL HIGH (ref 70–99)
Potassium: 4.3 mmol/L (ref 3.5–5.1)
Sodium: 131 mmol/L — ABNORMAL LOW (ref 135–145)
Total Bilirubin: 0.5 mg/dL (ref 0.3–1.2)
Total Protein: 5.9 g/dL — ABNORMAL LOW (ref 6.5–8.1)

## 2020-10-03 LAB — PHOSPHORUS: Phosphorus: 3.3 mg/dL (ref 2.5–4.6)

## 2020-10-03 LAB — MAGNESIUM: Magnesium: 2.1 mg/dL (ref 1.7–2.4)

## 2020-10-03 NOTE — Progress Notes (Signed)
PROGRESS NOTE    [name]  UMP:[id number] DOB: 20-Jan-1941 DOA: 09/23/2020 PCP: Josetta Huddle, MD    Brief Narrative:  This 80 y.o.femalewith medical history significant ofend-stage renal disease on hemodialysis TTS, coronary artery disease, carotid stenosis, atrial fibrillation, previous history of nonsustained V. tach, diabetes, essential hypertension, hyperlipidemia, hard of hearing who presented with multiple episodes of syncope prior to admission, later found to have vtach by EMS. Pt was admitted for further work up.  Patient was scheduled to undergo left heart cath on 3/14 when she sustained an episode of symptomatic V. Tach (code blue), requiring brief chest compressions with ROSC. Later she underwent emergent cath on 3/13 with new 100% ostial and proximal CTO of SVG RCA. EP was consulted, not a candidate for ICD.  CT angio showed no PE.  Patient continued to have several episodes of nonsustained V. tach or ventricular bigeminy, hypoxia. Cardiology recommended amiodarone, no beta-blocker due to bradycardia. Palliative medicine following, goals of care discussed, DNR/DNI, for now continue full scope medical treatment.  Nephrology following for hemodialysis.  Assessment & Plan:   Principal Problem:   Ventricular tachycardia ([place]) Active Problems:   Essential hypertension   Hyperlipidemia associated with type 2 diabetes mellitus (HCC)   Paroxysmal atrial fibrillation (HCC)   Coronary artery disease involving native heart without angina pectoris   Hypokalemia   QT prolongation   ESRD on dialysis (HCC)   Anemia of chronic disease   Diabetes mellitus type 2 in nonobese [facility])   Syncope and collapse   Coronary artery disease involving autologous vein bypass graft   Demand ischemia of myocardium (HCC)   Pressure injury of skin   Recurrent symptomatic ventricular tachycardia with multiple syncopal episodes ([place]), hypotension -Cardiac cath on 3/13 with findings of new 100%  ostial and proximal CTO of SVG RCA. -Seen by EP, not a candidate for ICD.  No further invasive intervention from cardiac standpoint. -Continue aspirin, Plavix, statin -Cardiology following, now transitioned to oral amiodarone. -Palliative medicine was consulted, currently goals DNR/DNI, continue full scope medical treatment.   -Per nephrology, will Continue dialysis if patient is able to tolerate, will consider transitioning to comfort care if she cannot tolerate. 10/01/2020: Cardiology input is highly appreciated.  Paroxysmal atrial fibrillation-postop -Received amiodarone bolus with drip, now transitioned to oral amiodarone. -Cardiology following, not a candidate for anticoagulation due to previous brain bleed.  Diabetes mellitus type 2, IDDM, with nephropathy -Hemoglobin A1c 5.0 -Continue sliding scale insulin  Essential hypertension: BP is not correlating with A-line blood pressure documentation.   Currently on midodrine.  Improving. Low blood pressure noted using conventional  measuring blood pressure  ESRD on hemodialysis, TTS -Nephrology following, plan to continue HD and assess if she is able to tolerate.   If patient is not able to tolerate HD, will consider pursuing comfort care.  Palliative medicine is following 10/01/2020: Patient underwent hemodialysis. Next HD 3/22.  Anemia of chronic disease : -Continue ESA, H&H currently stable.  Hemoglobin is currently at goal.  Transaminitis : -Possibly due to #1, hypotension, no acute complaints/abdominal pain -LFTs are improving, continue monitoring  Generalized debility, very deconditioned, FTT -Slightly alert and oriented today. , increase mobility, PT OT  Obesity Estimated body mass index is 28.61 kg/m as calculated from the following:   Height as of this encounter: 5\' 4"  (1.626 m).   Weight as of this encounter: 75.6 kg.   DVT prophylaxis: heparin sq Code Status: DNR Family Communication: No family at bed  side. Disposition Plan:  Status is: Inpatient  Remains inpatient appropriate because:Inpatient level of care appropriate due to severity of illness   Dispo: The patient is from: Home              Anticipated d/c is to: TBD              Patient currently is not medically stable to d/c.   Difficult to place patient No   Consultants:    Nephrology  Procedures: [facility] 3/13  Antimicrobials:   Anti-infectives (From admission, onward)   None      Subjective: Patient was seen and examined in HD suite.  Overnight events noted.   She is alert but still confused,  She gets into conversation. She opens eyes and goes back to sleep.  Objective: Vitals:   10/03/20 1130 10/03/20 1200 10/03/20 1215 10/03/20 1251  BP: (!) 159/76 (!) 89/39 (!) 86/48 (!) 94/34  Pulse:    70  Resp: 20 (!) 22  19  Temp:   97.9 F (36.6 C) 98.8 F (37.1 C)  TempSrc:   Oral Oral  SpO2:  99%  91%  Weight:      Height:        Intake/Output Summary (Last 24 hours) at 10/03/2020 1528 Last data filed at 10/03/2020 1200 Gross per 24 hour  Intake --  Output 1160 ml  Net -1160 ml   Filed Weights   10/01/20 0500 10/02/20 0409 10/03/20 0426  Weight: 75.6 kg 76.6 kg 76.6 kg    Examination:  General exam: Appears calm and comfortable, not in any acute distress. Respiratory system: Clear to auscultation. Respiratory effort normal. Cardiovascular system: S1 & S2 heard, RRR. No JVD, murmurs, rubs, gallops or clicks. No pedal edema. Gastrointestinal system: Abdomen is nondistended, soft and nontender. No organomegaly or masses felt.  Normal bowel sounds heard. Central nervous system: Alert and oriented. No focal neurological deficits. Extremities: Symmetric 5 x 5 power. Skin: No rashes, lesions or ulcers Psychiatry: Judgement and insight appear normal. Mood & affect appropriate.     Data Reviewed: I have personally reviewed following labs and imaging studies  CBC: Recent Labs  Lab 10/01/20 0327  10/03/20 0136  WBC 10.1 10.4  HGB 10.0* 11.4*  HCT 31.8* 34.1*  MCV 105.6* 99.1  PLT 150 916   Basic Metabolic Panel: Recent Labs  Lab 09/27/20 0110 09/28/20 0358 09/30/20 1421 10/01/20 0327 10/03/20 0136  NA 136 135  --  130* 131*  K 3.8 3.8  --  4.0 4.3  CL 96* 94*  --  92* 95*  CO2 28 29  --  26 24  GLUCOSE 170* 166*  --  169* 174*  BUN 15 33*  --  44* 44*  CREATININE 3.48* 5.34*  --  5.90* 5.38*  CALCIUM 8.6* 8.4*  --  8.7* 9.1  MG 2.1  --  2.1  --  2.1  PHOS  --   --   --   --  3.3   GFR: Estimated Creatinine Clearance: 8.4 mL/min (A) (by C-G formula based on SCr of 5.38 mg/dL (H)). Liver Function Tests: Recent Labs  Lab 09/27/20 0110 09/28/20 0358 10/03/20 0136  AST 139* 53* 20  ALT 143* 107* 29  ALKPHOS 66 70 81  BILITOT 0.7 0.7 0.5  PROT 5.7* 5.4* 5.9*  ALBUMIN 2.5* 2.5* 2.3*   No results for input(s): LIPASE, AMYLASE in the last 168 hours. No results for input(s): AMMONIA in the last 168 hours. Coagulation Profile: No results for  input(s): INR, PROTIME in the last 168 hours. Cardiac Enzymes: No results for input(s): CKTOTAL, CKMB, CKMBINDEX, TROPONINI in the last 168 hours. BNP (last 3 results) No results for input(s): PROBNP in the last 8760 hours. HbA1C: No results for input(s): HGBA1C in the last 72 hours. CBG: Recent Labs  Lab 10/02/20 0610 10/02/20 1620 10/02/20 2125 10/03/20 0626 10/03/20 1249  GLUCAP 189* 201* 174* 125* 138*   Lipid Profile: No results for input(s): CHOL, HDL, LDLCALC, TRIG, CHOLHDL, LDLDIRECT in the last 72 hours. Thyroid Function Tests: No results for input(s): TSH, T4TOTAL, FREET4, T3FREE, THYROIDAB in the last 72 hours. Anemia Panel: No results for input(s): VITAMINB12, FOLATE, FERRITIN, TIBC, IRON, RETICCTPCT in the last 72 hours. Sepsis Labs: No results for input(s): PROCALCITON, LATICACIDVEN in the last 168 hours.  Recent Results (from the past 240 hour(s))  Resp Panel by RT-PCR (Flu A&B, Covid)  Nasopharyngeal Swab     Status: None   Collection Time: 09/23/20  7:59 PM   Specimen: Nasopharyngeal Swab; Nasopharyngeal(NP) swabs in vial transport medium  Result Value Ref Range Status   SARS Coronavirus 2 by RT PCR NEGATIVE NEGATIVE Final    Comment: (NOTE) SARS-CoV-2 target nucleic acids are NOT DETECTED.  The SARS-CoV-2 RNA is generally detectable in upper respiratory specimens during the acute phase of infection. The lowest concentration of SARS-CoV-2 viral copies this assay can detect is 138 copies/mL. A negative result does not preclude SARS-Cov-2 infection and should not be used as the sole basis for treatment or other patient management decisions. A negative result may occur with  improper specimen collection/handling, submission of specimen other than nasopharyngeal swab, presence of viral mutation(s) within the areas targeted by this assay, and inadequate number of viral copies(<138 copies/mL). A negative result must be combined with clinical observations, patient history, and epidemiological information. The expected result is Negative.  Fact Sheet for Patients:  EntrepreneurPulse.com.au  Fact Sheet for Healthcare Providers:  IncredibleEmployment.be  This test is no t yet approved or cleared by the [place] FDA and  has been authorized for detection and/or diagnosis of SARS-CoV-2 by FDA under an Emergency Use Authorization (EUA). This EUA will remain  in effect (meaning this test can be used) for the duration of the COVID-19 declaration under Section 564(b)(1) of the Act, 21 U.S.C.section 360bbb-3(b)(1), unless the authorization is terminated  or revoked sooner.       Influenza A by PCR NEGATIVE NEGATIVE Final   Influenza B by PCR NEGATIVE NEGATIVE Final    Comment: (NOTE) The Xpert Xpress SARS-CoV-2/FLU/RSV plus assay is intended as an aid in the diagnosis of influenza from Nasopharyngeal swab specimens and should not be  used as a sole basis for treatment. Nasal washings and aspirates are unacceptable for Xpert Xpress SARS-CoV-2/FLU/RSV testing.  Fact Sheet for Patients: EntrepreneurPulse.com.au  Fact Sheet for Healthcare Providers: IncredibleEmployment.be  This test is not yet approved or cleared by the [place] FDA and has been authorized for detection and/or diagnosis of SARS-CoV-2 by FDA under an Emergency Use Authorization (EUA). This EUA will remain in effect (meaning this test can be used) for the duration of the COVID-19 declaration under Section 564(b)(1) of the Act, 21 U.S.C. section 360bbb-3(b)(1), unless the authorization is terminated or revoked.  Performed at [facility], [place] [address], [place], [place] [address]   MRSA PCR Screening     Status: None   Collection Time: 09/24/20 12:08 PM   Specimen: Nasopharyngeal  Result Value Ref Range Status  MRSA by PCR NEGATIVE NEGATIVE Final    Comment:        The GeneXpert MRSA Assay (FDA approved for NASAL specimens only), is one component of a comprehensive MRSA colonization surveillance program. It is not intended to diagnose MRSA infection nor to guide or monitor treatment for MRSA infections. Performed at [facility], [place] [address], [place], [place] [address]     Radiology Studies: No results found.  Scheduled Meds: . acetaminophen  650 mg Oral TID  . allopurinol  100 mg Oral q AM  . amiodarone  200 mg Oral BID  . brimonidine  1 drop Right Eye QHS   And  . brimonidine  2 drop Left Eye QHS  . calcium carbonate  1 tablet Oral Q lunch  . Chlorhexidine Gluconate Cloth  6 each Topical Q0600  . cinacalcet  30 mg Oral Once per day on Tue Thu  . clopidogrel  75 mg Oral q AM  . dronabinol  5 mg Oral BID AC  . feeding supplement (NEPRO CARB STEADY)  237 mL Oral TID BM  . hydrocortisone   Rectal TID  . hydrocortisone  25 mg Rectal BID  . insulin aspart  0-5 Units  Subcutaneous QHS  . insulin aspart  0-6 Units Subcutaneous TID WC  . latanoprost  1 drop Both Eyes QHS  . midodrine  10 mg Oral TID WC  . multivitamin  1 tablet Oral QHS  . polycarbophil  625 mg Oral BID WC  . sevelamer carbonate  1,600 mg Oral BID WC  . sodium chloride flush  3 mL Intravenous Q12H  . timolol  1 drop Left Eye QHS   Continuous Infusions: . sodium chloride    . sodium chloride    . sodium chloride       LOS: 10 days    Time spent: 25 mins    Shawna Clamp, MD [facility]   If 7PM-7AM, please contact night-coverage

## 2020-10-03 NOTE — Progress Notes (Signed)
[facility] Progress Note  Subjective:  Seen on HD, pt states "i'm tired". No specific c/o's.     Vitals:   10/03/20 1130 10/03/20 1200 10/03/20 1215 10/03/20 1251  BP: (!) 159/76 (!) 89/39 (!) 86/48 (!) 94/34  Pulse:    70  Resp: 20 (!) 22  19  Temp:   97.9 F (36.6 C) 98.8 F (37.1 C)  TempSrc:   Oral Oral  SpO2:  99%  91%  Weight:      Height:        Exam General: Chronically ill appearing female, nad, lethargic Heart: RRR, no murmur Lungs: CTA anteriorly without wheezing or rales Abdomen: Soft, non-distended, +BS. Extremities: No edema b/l lower extremities Dialysis Access: LUE AVF + bruit; maturing RUA AVF+bruit     OP HD: [facility] TTS  4h  76.5kg  3K/Ca 2.5 bath  Hep none  LUE AVF - 100 mircera q 2 wks, last 3/08     Assessment/ Plan: 1. Vtach/ CAD/ hx of CABG: cath 09/25/31 with severe native disease and new 100% ostial + prox CTO of SVG RCA. Seen by EP, not ICD candidate. Rx'ing medically , on po amiodarone.  2. ESRD - on HD TTS. Palliative care discussions ongoing. HD today.  3. HD access: due to mild steal symptoms in L hand, VVS placed new RUA BC AVF on 07/03/20, with the idea of ligating the L arm AVF when the R AVF is being used successfully.  4. Anemia: Hgb 10.2 on 3/15. Hgb stable.  5. CKD-MBD: Checking RFP, continue binders and vits 6. Nutrition: protein supps 7. Hypotension/volume: BP's 80's- 100's. Midodrine 10 pre HD tiw started. Close to dry wt.  8. DM2- on insulin, per pmd 9. Dispo: pending     [name] 10/03/2020, 2:02 PM   Recent Labs  Lab 10/01/20 0327 10/03/20 0136  K 4.0 4.3  BUN 44* 44*  CREATININE 5.90* 5.38*  CALCIUM 8.7* 9.1  PHOS  --  3.3  HGB 10.0* 11.4*   Inpatient medications:  acetaminophen  650 mg Oral TID   allopurinol  100 mg Oral q AM   amiodarone  200 mg Oral BID   brimonidine  1 drop Right Eye QHS   And   brimonidine  2 drop Left Eye QHS   calcium carbonate  1 tablet Oral Q lunch    Chlorhexidine Gluconate Cloth  6 each Topical Q0600   cinacalcet  30 mg Oral Once per day on Tue Thu   clopidogrel  75 mg Oral q AM   dronabinol  5 mg Oral BID AC   feeding supplement (NEPRO CARB STEADY)  237 mL Oral TID BM   hydrocortisone   Rectal TID   hydrocortisone  25 mg Rectal BID   insulin aspart  0-5 Units Subcutaneous QHS   insulin aspart  0-6 Units Subcutaneous TID WC   latanoprost  1 drop Both Eyes QHS   midodrine  10 mg Oral TID WC   multivitamin  1 tablet Oral QHS   polycarbophil  625 mg Oral BID WC   sevelamer carbonate  1,600 mg Oral BID WC   sodium chloride flush  3 mL Intravenous Q12H   timolol  1 drop Left Eye QHS    sodium chloride     sodium chloride     sodium chloride     sodium chloride, sodium chloride, acetaminophen, alteplase, famotidine, heparin, lidocaine (PF), lidocaine-prilocaine, nitroGLYCERIN, oxyCODONE-acetaminophen, pentafluoroprop-tetrafluoroeth, polyvinyl alcohol, witch hazel-glycerin

## 2020-10-03 NOTE — Progress Notes (Signed)
Daily Progress Note   Patient Name: [name]       Date: 10/03/2020 DOB: [birth year]  Age: 80 y.o. MRN#: [id number] Attending Physician: Shawna Clamp, MD Primary Care Physician: Josetta Huddle, MD Admit Date: 09/23/2020  Reason for Consultation/Follow-up: continued GOC discussion  Subjective: Received report from primary RN - patient had dialysis today. She has been hallucinating, "seeing things in the corner of the room". Oral intake has been minimal.   On my assessment, patient appears slightly restless. Attempting to remove her BP cuff. She is confused. She states she has pain from her "chin to her heels".   19:30--I spoke with daughter/[name] by phone. [name] had been informed that dialysis was only able to remove 1L of fluids due to BP being low. Discussed that she has not been eating well today or yesterday. [name] shares that the medical team has been telling her patient may not be able to tolerate dialysis for much longer. 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 It has been difficult to accept that her mother is approaching EOL. She reports processing the information and making decisions "day by day". I inquired how her father was doing - she states he is improving and may be able to discharge home this weekend.   Inquired if she had received a copy of the "hard choices" book - she states that she has but wants "nothing to do with it". She shares that some days she does not want to hear or talk about her mother's current medical situation. I provided reassurance that is not uncommon and that our team will not push her to talk about EOL unless she is ready. Let her know that PMT will continue to follow her mother and are here for support if she needs it.    Length of Stay: 10  Current Medications: Scheduled Meds:  . acetaminophen  650 mg Oral TID  . allopurinol  100 mg Oral q AM  . amiodarone  200 mg Oral BID  . brimonidine  1 drop Right Eye QHS   And  . brimonidine  2 drop Left Eye QHS  . calcium carbonate  1 tablet Oral Q lunch  . Chlorhexidine Gluconate Cloth  6 each Topical Q0600  . cinacalcet  30 mg Oral Once  per day on Tue Thu  . clopidogrel  75 mg Oral q AM  . dronabinol  5 mg Oral BID AC  . feeding supplement (NEPRO CARB STEADY)  237 mL Oral TID BM  . hydrocortisone   Rectal TID  . hydrocortisone  25 mg Rectal BID  . insulin aspart  0-5 Units Subcutaneous QHS  . insulin aspart  0-6 Units Subcutaneous TID WC  . latanoprost  1 drop Both Eyes QHS  . midodrine  10 mg Oral TID WC  . multivitamin  1 tablet Oral QHS  . polycarbophil  625 mg Oral BID WC  . sevelamer carbonate  1,600 mg Oral BID WC  . sodium chloride flush  3 mL Intravenous Q12H  . timolol  1 drop Left Eye QHS    PRN Meds: sodium chloride, sodium chloride, acetaminophen, alteplase, famotidine, heparin, lidocaine (PF), lidocaine-prilocaine, nitroGLYCERIN, oxyCODONE-acetaminophen, pentafluoroprop-tetrafluoroeth, polyvinyl alcohol, witch hazel-glycerin  Physical Exam Vitals reviewed.  Constitutional:      General: She is not in acute distress.    Appearance: She is ill-appearing.  Pulmonary:     Effort: Pulmonary effort is normal.  Neurological:     Mental Status: She is alert. She is confused.     Motor: Weakness present.  Psychiatric:     Comments: restless             Vital Signs: BP (!) 91/28 (BP Location: Right Leg)   Pulse 71   Temp 98.2 F (36.8 C) (Oral)   Resp 18   Ht 5\' 4"  (1.626 m)   Wt 76.6 kg   SpO2 94%   BMI 28.99 kg/m  SpO2: SpO2: 94 % O2 Device: O2 Device: Room Air O2 Flow Rate: O2 Flow Rate (L/min): 2 L/min  Intake/output summary:   Intake/Output Summary (Last 24 hours) at 10/03/2020 1659 Last data filed at 10/03/2020  1200 Gross per 24 hour  Intake --  Output 1160 ml  Net -1160 ml   LBM: Last BM Date: 10/02/20 Baseline Weight: Weight: 77.1 kg Most recent weight: Weight:  (scale not working)       Palliative Assessment/Data: PPS 20%      Palliative Care Assessment & Plan   Patient Profile: 80 y.o.femalewith past medical history of ESRD on dialysis TTS, coronary artery disease s/p CABG, carotid stenosis, atrial fibrillation, previous history of non-sustained v-tach, DM2, HTN, HLD, and hearing loss presenting to the emergency departmenton3/12/2022with multiple episodes of syncope.She reportedly had an episode right after dialysis. Had another episode at home, became unresponsive, and family started CPR. When EMS arrived, monitor showed nonsustained v-tach bigeminy, lidocaine infusion was started.  ED Course: On arrival to ED patient was back in sinus rhythm. Chest x-ray showed left lower lobe consolidation probably atelectasis versus infection or aspiration. She was admitted to [facility] for further evaluation of syncopal episodes.   On 3/13, patient was undergoing echocardiogram and developed symptomatic v-tach requiring CPR. She underwent emergent cardiac cath with findings of new 100% stenosis in multiple vessels. Patient was seen by EP, but is not a candidate for ICD.  Assessment: Syncopal episodes with collapse Symptomatic ventricular tachycardia Paroxysmal atrial fibrillation Coronary artery disease DM with nephropathy Essential hypertension ESRD on HD Anemia of chronic disease Generalized debility Deconditioned state Failure to thrive  Recommendations/Plan:  Continue current full scope medical treatment  Continue DNR/DNI as previously documented  Daughter understands patient is approaching EOL. If patient is eating, goal is to continue dialysis. If patient stops eating family will consider stopping dialysis  and transition to hospice   Patient's short term goal is to see her  granddaughter get married in mid-April.   Continue Marinol 5mg  PO BID before lunch and dinner for appetite stimulant, If Marinol is not effective, can consider dexamethasone 4mg  PO in AM daily or prednisolone 5mg  PO in AM daily (however this may worsen agitation)  Continue Tylenol 650mg  PO TID for sacral pain/discomfort as well as q2h turns.   PMT will continue to follow and support holistically    Goals of Care and Additional Recommendations:  Limitations on Scope of Treatment: Full Scope Treatment and No Artificial Feeding  Code Status:  DNR/DNI  Prognosis:  poor  Discharge Planning:  To Be Determined    Thank you for allowing the Palliative Medicine Team to assist in the care of this patient.   Total Time 25 minutes Prolonged Time Billed  no       Greater than 50%  of this time was spent counseling and coordinating care related to the above assessment and plan.  Lavena Bullion, NP  Please contact Palliative Medicine Team phone at [phone] for questions and concerns.

## 2020-10-04 LAB — BASIC METABOLIC PANEL
Anion gap: 13 (ref 5–15)
BUN: 25 mg/dL — ABNORMAL HIGH (ref 8–23)
CO2: 24 mmol/L (ref 22–32)
Calcium: 9 mg/dL (ref 8.9–10.3)
Chloride: 93 mmol/L — ABNORMAL LOW (ref 98–111)
Creatinine, Ser: 3.6 mg/dL — ABNORMAL HIGH (ref 0.44–1.00)
GFR, Estimated: 12 mL/min — ABNORMAL LOW (ref >60)
Glucose, Bld: 225 mg/dL — ABNORMAL HIGH (ref 70–99)
Potassium: 4.3 mmol/L (ref 3.5–5.1)
Sodium: 130 mmol/L — ABNORMAL LOW (ref 135–145)

## 2020-10-04 LAB — GLUCOSE, CAPILLARY
Glucose-Capillary: 188 mg/dL — ABNORMAL HIGH (ref 70–99)
Glucose-Capillary: 189 mg/dL — ABNORMAL HIGH (ref 70–99)
Glucose-Capillary: 181 mg/dL — ABNORMAL HIGH (ref 70–99)
Glucose-Capillary: 151 mg/dL — ABNORMAL HIGH (ref 70–99)

## 2020-10-04 NOTE — Progress Notes (Signed)
PROGRESS NOTE    [name]  UEA:[id number] DOB: [birth year] DOA: 09/23/2020 PCP: Josetta Huddle, MD    Brief Narrative:  This 80 y.o.femalewith medical history significant ofend-stage renal disease on hemodialysis TTS, coronary artery disease, carotid stenosis, atrial fibrillation, previous history of nonsustained V. tach, diabetes, essential hypertension, hyperlipidemia, hard of hearing who presented with multiple episodes of syncope prior to admission, later found to have vtach by EMS. Pt was admitted for further work up.  Patient was scheduled to undergo left heart cath on 3/14 when she sustained an episode of symptomatic V. Tach (code blue), requiring brief chest compressions with ROSC. Later she underwent emergent cath on 3/13 with new 100% ostial and proximal CTO of SVG RCA. EP was consulted, not a candidate for ICD.  CT angio showed no PE.  Patient continued to have several episodes of nonsustained V. tach or ventricular bigeminy, hypoxia. Cardiology recommended amiodarone, no beta-blocker due to bradycardia. Palliative medicine following, goals of care discussed, DNR/DNI, for now,  continue full scope medical treatment.  Nephrology following for hemodialysis.  Assessment & Plan:   Principal Problem:   Ventricular tachycardia ([place]) Active Problems:   Essential hypertension   Hyperlipidemia associated with type 2 diabetes mellitus (HCC)   Paroxysmal atrial fibrillation (HCC)   Coronary artery disease involving native heart without angina pectoris   Hypokalemia   QT prolongation   ESRD on dialysis (HCC)   Anemia of chronic disease   Diabetes mellitus type 2 in nonobese [facility])   Syncope and collapse   Coronary artery disease involving autologous vein bypass graft   Demand ischemia of myocardium (HCC)   Pressure injury of skin   Recurrent symptomatic ventricular tachycardia with multiple syncopal episodes ([place]), hypotension -Cardiac cath on 3/13 with findings of new 100%  ostial and proximal CTO of SVG RCA. -Seen by EP, not a candidate for ICD.  No further invasive intervention from cardiac standpoint. -Continue aspirin, Plavix, statin -Cardiology following, now transitioned to oral amiodarone. -Palliative medicine was consulted, currently goals DNR/DNI, continue full scope medical treatment.   -Per nephrology, will Continue dialysis if patient is able to tolerate, will consider transitioning to comfort care if she cannot tolerate. 10/01/2020: Cardiology input is highly appreciated.  Paroxysmal atrial fibrillation-postop -Received amiodarone bolus with drip, now transitioned to oral amiodarone. -Cardiology following, not a candidate for anticoagulation due to previous brain bleed.  Diabetes mellitus type 2, IDDM, with nephropathy -Hemoglobin A1c 5.0 -Continue sliding scale insulin  Essential hypertension: Currently on midodrine.  Improving. Low blood pressure noted using conventional  measuring blood pressure  ESRD on hemodialysis, TTS -Nephrology following, plan to continue HD and assess if she is able to tolerate.   If patient is not able to tolerate HD, will consider pursuing comfort care.  Palliative medicine is following 10/01/2020: Patient underwent hemodialysis. Next HD 3/24. She is tolerating hemodialysis. Continue as per schedule.  Anemia of chronic disease : -Continue ESA, H&H currently stable.  Hemoglobin is currently at goal.  Transaminitis : -Possibly due to #1, hypotension, no acute complaints/abdominal pain -LFTs back to normal.  Generalized debility, very deconditioned, FTT -Slightly alert and oriented today. , increase mobility, PT OT  Obesity Estimated body mass index is 28.61 kg/m as calculated from the following:   Height as of this encounter: 5\' 4"  (1.626 m).   Weight as of this encounter: 75.6 kg.   DVT prophylaxis: heparin sq Code Status: DNR Family Communication: No family at bed side. Disposition  Plan:  Status is:  Inpatient  Remains inpatient appropriate because:Inpatient level of care appropriate due to severity of illness   Dispo: The patient is from: Home              Anticipated d/c is to: TBD              Patient currently is not medically stable to d/c.   Difficult to place patient No   Consultants:    Nephrology  Procedures: [facility] 3/13  Antimicrobials:   Anti-infectives (From admission, onward)   None      Subjective: Patient was seen and examined in HD suite.  Overnight events noted.   She is alert but still confused, She gets into conversation. She is following commands,    Objective: Vitals:   10/03/20 2324 10/04/20 0320 10/04/20 0854 10/04/20 1259  BP: (!) 74/64 (!) 87/56 (!) 162/34 100/89  Pulse: 60 66 64 64  Resp: 15 20 16 18   Temp: 98.2 F (36.8 C) 98.4 F (36.9 C) 98 F (36.7 C) 98.1 F (36.7 C)  TempSrc: Oral Oral Oral Oral  SpO2: 95% 95% 94% 94%  Weight:      Height:        Intake/Output Summary (Last 24 hours) at 10/04/2020 1443 Last data filed at 10/03/2020 2211 Gross per 24 hour  Intake 240 ml  Output --  Net 240 ml   Filed Weights   10/01/20 0500 10/02/20 0409 10/03/20 0426  Weight: 75.6 kg 76.6 kg 76.6 kg    Examination:  General exam: Appears calm and comfortable, not in any acute distress. Respiratory system: Clear to auscultation. Respiratory effort normal. Cardiovascular system: S1 & S2 heard, RRR. No JVD, murmurs, rubs, gallops or clicks. No pedal edema. Gastrointestinal system: Abdomen is nondistended, soft and nontender. No organomegaly or masses felt.  Normal bowel sounds heard. Central nervous system: Alert and oriented. No focal neurological deficits. Extremities: Symmetric 5 x 5 power. Skin: No rashes, lesions or ulcers Psychiatry: Judgement and insight appear normal. Mood & affect appropriate.     Data Reviewed: I have personally reviewed following labs and imaging studies  CBC: Recent Labs  Lab  10/01/20 0327 10/03/20 0136  WBC 10.1 10.4  HGB 10.0* 11.4*  HCT 31.8* 34.1*  MCV 105.6* 99.1  PLT 150 784   Basic Metabolic Panel: Recent Labs  Lab 09/28/20 0358 09/30/20 1421 10/01/20 0327 10/03/20 0136 10/04/20 0114  NA 135  --  130* 131* 130*  K 3.8  --  4.0 4.3 4.3  CL 94*  --  92* 95* 93*  CO2 29  --  26 24 24   GLUCOSE 166*  --  169* 174* 225*  BUN 33*  --  44* 44* 25*  CREATININE 5.34*  --  5.90* 5.38* 3.60*  CALCIUM 8.4*  --  8.7* 9.1 9.0  MG  --  2.1  --  2.1  --   PHOS  --   --   --  3.3  --    GFR: Estimated Creatinine Clearance: 12.5 mL/min (A) (by C-G formula based on SCr of 3.6 mg/dL (H)). Liver Function Tests: Recent Labs  Lab 09/28/20 0358 10/03/20 0136  AST 53* 20  ALT 107* 29  ALKPHOS 70 81  BILITOT 0.7 0.5  PROT 5.4* 5.9*  ALBUMIN 2.5* 2.3*   No results for input(s): LIPASE, AMYLASE in the last 168 hours. No results for input(s): AMMONIA in the last 168 hours. Coagulation Profile: No results for input(s): INR, PROTIME in the last 168  hours. Cardiac Enzymes: No results for input(s): CKTOTAL, CKMB, CKMBINDEX, TROPONINI in the last 168 hours. BNP (last 3 results) No results for input(s): PROBNP in the last 8760 hours. HbA1C: No results for input(s): HGBA1C in the last 72 hours. CBG: Recent Labs  Lab 10/03/20 1249 10/03/20 1636 10/03/20 2112 10/04/20 0617 10/04/20 1213  GLUCAP 138* 191* 181* 188* 189*   Lipid Profile: No results for input(s): CHOL, HDL, LDLCALC, TRIG, CHOLHDL, LDLDIRECT in the last 72 hours. Thyroid Function Tests: No results for input(s): TSH, T4TOTAL, FREET4, T3FREE, THYROIDAB in the last 72 hours. Anemia Panel: No results for input(s): VITAMINB12, FOLATE, FERRITIN, TIBC, IRON, RETICCTPCT in the last 72 hours. Sepsis Labs: No results for input(s): PROCALCITON, LATICACIDVEN in the last 168 hours.  No results found for this or any previous visit (from the past 240 hour(s)).  Radiology Studies: No results  found.  Scheduled Meds: . acetaminophen  650 mg Oral TID  . allopurinol  100 mg Oral q AM  . amiodarone  200 mg Oral BID  . brimonidine  1 drop Right Eye QHS   And  . brimonidine  2 drop Left Eye QHS  . calcium carbonate  1 tablet Oral Q lunch  . Chlorhexidine Gluconate Cloth  6 each Topical Q0600  . cinacalcet  30 mg Oral Once per day on Tue Thu  . clopidogrel  75 mg Oral q AM  . dronabinol  5 mg Oral BID AC  . feeding supplement (NEPRO CARB STEADY)  237 mL Oral TID BM  . hydrocortisone   Rectal TID  . hydrocortisone  25 mg Rectal BID  . insulin aspart  0-5 Units Subcutaneous QHS  . insulin aspart  0-6 Units Subcutaneous TID WC  . latanoprost  1 drop Both Eyes QHS  . midodrine  10 mg Oral TID WC  . multivitamin  1 tablet Oral QHS  . polycarbophil  625 mg Oral BID WC  . sevelamer carbonate  1,600 mg Oral BID WC  . sodium chloride flush  3 mL Intravenous Q12H  . timolol  1 drop Left Eye QHS   Continuous Infusions: . sodium chloride    . sodium chloride    . sodium chloride       LOS: 11 days    Time spent: 25 mins    Shawna Clamp, MD [facility]   If 7PM-7AM, please contact night-coverage

## 2020-10-04 NOTE — Progress Notes (Signed)
[facility] Progress Note  Subjective:  Seen in room, no new /co    Vitals:   10/03/20 1643 10/03/20 2324 10/04/20 0320 10/04/20 0854  BP: (!) 91/28 (!) 74/64 (!) 87/56 (!) 162/34  Pulse: 71 60 66 64  Resp: 18 15 20 16   Temp: 98.2 F (36.8 C) 98.2 F (36.8 C) 98.4 F (36.9 C) 98 F (36.7 C)  TempSrc: Oral Oral Oral Oral  SpO2: 94% 95% 95% 94%  Weight:      Height:        Exam General: Chronically ill appearing female, nad, lethargic Heart: RRR, no murmur Lungs: CTA anteriorly without wheezing or rales Abdomen: Soft, non-distended, +BS. Extremities: No edema b/l lower extremities Dialysis Access: LUE AVF + bruit; maturing RUA AVF+bruit     OP HD: [facility] TTS 4h  76.5kg  3K/Ca 2.5 bath  Hep none  LUE AVF - 100 mircera q 2 wks, last 3/08     Assessment/ Plan: 1. VT/ cardiac arrest/ CAD hx of CABG: cath 09/25/31 with severe native disease and new 100% ostial + prox CTO of SVG RCA treated w/ stenting. Seen by EP, not an ICD candidate. Treating medically , on po amiodarone.  2. ESRD - on HD TTS. Next HD tomorrow.  3. HD access: due to mild steal symptoms in L hand, VVS placed new RUA BC AVF on 07/03/20, with the idea of ligating the L arm AVF when the R AVF is being used successfully.  4. Anemia ckd: Hgb 11.4 on 3/22. No esa given here. Hgb stable.  5. CKD-MBD: Checking RFP, continue binders and vits 6. Nutrition: protein supps 7. Hypotension/volume: BP's 80's- 100's. Midodrine 10 pre HD tiw started. At her dry wt.  8. DM2- on insulin, per pmd 9. DNR - palliative discussions are happening on a day by day basis     Rob Schertz 10/04/2020, 11:26 AM   Recent Labs  Lab 10/01/20 0327 10/03/20 0136 10/04/20 0114  K 4.0 4.3 4.3  BUN 44* 44* 25*  CREATININE 5.90* 5.38* 3.60*  CALCIUM 8.7* 9.1 9.0  PHOS  --  3.3  --   HGB 10.0* 11.4*  --    Inpatient medications: . acetaminophen  650 mg Oral TID  . allopurinol  100 mg Oral q AM  . amiodarone  200 mg  Oral BID  . brimonidine  1 drop Right Eye QHS   And  . brimonidine  2 drop Left Eye QHS  . calcium carbonate  1 tablet Oral Q lunch  . Chlorhexidine Gluconate Cloth  6 each Topical Q0600  . cinacalcet  30 mg Oral Once per day on Tue Thu  . clopidogrel  75 mg Oral q AM  . dronabinol  5 mg Oral BID AC  . feeding supplement (NEPRO CARB STEADY)  237 mL Oral TID BM  . hydrocortisone   Rectal TID  . hydrocortisone  25 mg Rectal BID  . insulin aspart  0-5 Units Subcutaneous QHS  . insulin aspart  0-6 Units Subcutaneous TID WC  . latanoprost  1 drop Both Eyes QHS  . midodrine  10 mg Oral TID WC  . multivitamin  1 tablet Oral QHS  . polycarbophil  625 mg Oral BID WC  . sevelamer carbonate  1,600 mg Oral BID WC  . sodium chloride flush  3 mL Intravenous Q12H  . timolol  1 drop Left Eye QHS   . sodium chloride    . sodium chloride    .  sodium chloride     sodium chloride, sodium chloride, acetaminophen, alteplase, famotidine, heparin, lidocaine (PF), lidocaine-prilocaine, nitroGLYCERIN, oxyCODONE-acetaminophen, pentafluoroprop-tetrafluoroeth, polyvinyl alcohol, witch hazel-glycerin

## 2020-10-04 NOTE — Progress Notes (Signed)
Nutrition Follow Up  DOCUMENTATION CODES:   Not applicable  INTERVENTION:    Nepro Shake po TID, each supplement provides 425 kcal and 19 grams protein  Magic cup TID with meals, each supplement provides 290 kcal and 9 grams of protein  Snacks BID  Renal MVI daily   NUTRITION DIAGNOSIS:   Inadequate oral intake related to poor appetite as evidenced by meal completion < 25%.  Ongoing  GOAL:   Patient will meet greater than or equal to 90% of their needs   Not meeting   MONITOR:   PO intake,Supplement acceptance,Weight trends,Labs,I & O's  REASON FOR ASSESSMENT:   Rounds    ASSESSMENT:   Patient with PMH significant for ESRD on HD, CAD, carotid stenosis, afib, DM, essential HTN, HLD, and is hard of hearing. Presents this admission with multiple syncopal episodes.   3/14- v tach, canceled L cardiac cath  Patient tolerating HD. Not eating enough to sustain. Per RN, patient taking some magic cups, sips of Nepro, and bites of meal. Family does not wish to have artifical feedings. Wishes to continue aggressive medical care. Continue current supplements.   EDW: 76.5 kg  Current weight: 76.6 kg   Medications: sensipar, marinol, SS novolog, fibercon BID, renvela Labs: Na 130 (L)   Diet Order:   Diet Order            DIET DYS 3 Room service appropriate? Yes with Assist; Fluid consistency: Thin  Diet effective now                 EDUCATION NEEDS:   Not appropriate for education at this time  Skin:  Skin Assessment: Skin Integrity Issues: Skin Integrity Issues:: DTI DTI: buttocks  Last BM:  3/23  Height:   Ht Readings from Last 1 Encounters:  09/23/20 5\' 4"  (1.626 m)    Weight:   Wt Readings from Last 1 Encounters:  10/03/20 76.6 kg    BMI:  Body mass index is 28.99 kg/m.  Estimated Nutritional Needs:   Kcal:  1800-2000 kcal  Protein:  90-105 grams  Fluid:  1000 ml + UOP   Mariana Single RD, LDN Clinical Nutrition Pager listed in  [place]

## 2020-10-05 DIAGNOSIS — R638 Other symptoms and signs concerning food and fluid intake: Secondary | ICD-10-CM

## 2020-10-05 LAB — CBC
HCT: 37.2 % (ref 36.0–46.0)
Hemoglobin: 12.2 g/dL (ref 12.0–15.0)
MCH: 32.6 pg (ref 26.0–34.0)
MCHC: 32.8 g/dL (ref 30.0–36.0)
MCV: 99.5 fL (ref 80.0–100.0)
Platelets: 233 10*3/uL (ref 150–400)
RBC: 3.74 MIL/uL — ABNORMAL LOW (ref 3.87–5.11)
RDW: 15.4 % (ref 11.5–15.5)
WBC: 17.2 10*3/uL — ABNORMAL HIGH (ref 4.0–10.5)
nRBC: 0 % (ref 0.0–0.2)

## 2020-10-05 LAB — BASIC METABOLIC PANEL
Anion gap: 13 (ref 5–15)
BUN: 38 mg/dL — ABNORMAL HIGH (ref 8–23)
CO2: 24 mmol/L (ref 22–32)
Calcium: 9.1 mg/dL (ref 8.9–10.3)
Chloride: 93 mmol/L — ABNORMAL LOW (ref 98–111)
Creatinine, Ser: 4.74 mg/dL — ABNORMAL HIGH (ref 0.44–1.00)
GFR, Estimated: 9 mL/min — ABNORMAL LOW (ref >60)
Glucose, Bld: 185 mg/dL — ABNORMAL HIGH (ref 70–99)
Potassium: 3.8 mmol/L (ref 3.5–5.1)
Sodium: 130 mmol/L — ABNORMAL LOW (ref 135–145)

## 2020-10-05 LAB — GLUCOSE, CAPILLARY
Glucose-Capillary: 183 mg/dL — ABNORMAL HIGH (ref 70–99)
Glucose-Capillary: 204 mg/dL — ABNORMAL HIGH (ref 70–99)
Glucose-Capillary: 185 mg/dL — ABNORMAL HIGH (ref 70–99)
Glucose-Capillary: 181 mg/dL — ABNORMAL HIGH (ref 70–99)

## 2020-10-05 MED ORDER — HEPARIN SODIUM (PORCINE) 1000 UNIT/ML DIALYSIS: 1000.0000 [IU] | INTRAMUSCULAR | Status: DC | PRN

## 2020-10-05 MED ORDER — PENTAFLUOROPROP-TETRAFLUOROETH EX AERO: 1.0000 "application " | INHALATION_SPRAY | CUTANEOUS | Status: DC | PRN

## 2020-10-05 MED ORDER — SODIUM CHLORIDE 0.9 % IV SOLN: 100.0000 mL | INTRAVENOUS | Status: DC | PRN

## 2020-10-05 MED ORDER — LIDOCAINE HCL (PF) 1 % IJ SOLN: 5.0000 mL | INTRAMUSCULAR | Status: DC | PRN

## 2020-10-05 MED ORDER — LIDOCAINE-PRILOCAINE 2.5-2.5 % EX CREA
1.0000 | TOPICAL_CREAM | CUTANEOUS | Status: DC | PRN
Start: 2020-10-05 — End: 2020-10-05

## 2020-10-05 MED ORDER — ALTEPLASE 2 MG IJ SOLR: 2.0000 mg | Freq: Once | INTRAMUSCULAR | Status: DC | PRN

## 2020-10-05 NOTE — Care Management Important Message (Signed)
Important Message  Patient Details  Name: [name] MRN: [id number] Date of Birth: January 14, 1941   Medicare Important Message Given:  Yes     Shelda Altes 10/05/2020, 8:33 AM

## 2020-10-05 NOTE — Progress Notes (Signed)
PROGRESS NOTE    [name]  YQI:[id number] DOB: Dec 07, 1940 DOA: 09/23/2020 PCP: Josetta Huddle, MD    Brief Narrative:  This 80 y.o.femalewith medical history significant ofend-stage renal disease on hemodialysis TTS, coronary artery disease, carotid stenosis, atrial fibrillation, previous history of nonsustained V. tach, diabetes, essential hypertension, hyperlipidemia, hard of hearing who presented with multiple episodes of syncope prior to admission, later found to have vtach by EMS. Pt was admitted for further work up.  Patient was scheduled to undergo left heart cath on 3/14 when she sustained an episode of symptomatic V. Tach (code blue), requiring brief chest compressions with ROSC. Later she underwent emergent cath on 3/13 with new 100% ostial and proximal CTO of SVG RCA. EP was consulted, not a candidate for ICD.  CT angio showed no PE.  Patient continued to have several episodes of nonsustained V. tach or ventricular bigeminy, hypoxia. Cardiology recommended amiodarone, no beta-blocker due to bradycardia. Palliative medicine following, goals of care discussed, DNR/DNI, for now,  continue full scope medical treatment.  Nephrology following for hemodialysis.  Assessment & Plan:   Principal Problem:   Ventricular tachycardia ([place]) Active Problems:   Essential hypertension   Hyperlipidemia associated with type 2 diabetes mellitus (HCC)   Paroxysmal atrial fibrillation (HCC)   Coronary artery disease involving native heart without angina pectoris   Hypokalemia   QT prolongation   ESRD on dialysis (HCC)   Anemia of chronic disease   Diabetes mellitus type 2 in nonobese [facility])   Syncope and collapse   Coronary artery disease involving autologous vein bypass graft   Demand ischemia of myocardium (HCC)   Pressure injury of skin   Recurrent symptomatic ventricular tachycardia with multiple syncopal episodes ([place]), hypotension -Cardiac cath on 3/13 with findings of new 100%  ostial and proximal CTO of SVG RCA. -Seen by EP, not a candidate for ICD.  No further invasive intervention from cardiac standpoint. -Continue aspirin, Plavix, statin -Cardiology following, now transitioned to oral amiodarone. -Palliative medicine was consulted, currently goals DNR/DNI, continue full scope medical treatment.   -Per nephrology, will Continue dialysis if patient is able to tolerate, will consider transitioning to comfort care if she cannot tolerate. 10/01/2020: Cardiology input is highly appreciated.  Paroxysmal atrial fibrillation-postop -Received amiodarone bolus with drip, now transitioned to oral amiodarone. -Cardiology following, not a candidate for anticoagulation due to previous brain bleed.  Diabetes mellitus type 2, IDDM, with nephropathy -Hemoglobin A1c 5.0 -Continue sliding scale insulin  Essential hypertension: Currently on midodrine.  Improving.  ESRD on hemodialysis, TTS -Nephrology following, plan to continue HD and assess if she is able to tolerate.   If patient is not able to tolerate HD, will consider pursuing comfort care.  Palliative medicine is following 10/01/2020: Patient underwent hemodialysis. Next HD 3/24. She is tolerating hemodialysis. Continue as per schedule. Patient should be able to sit in the chair for 4 to 6 hours for hemodialysis to be considered to be discharged home or nursing home.  Anemia of chronic disease : -Continue ESA, H&H currently stable.  Hemoglobin is currently at goal.  Transaminitis : -Possibly due to #1, hypotension, no acute complaints/abdominal pain -LFTs back to normal.  Generalized debility, very deconditioned, FTT -Slightly alert and oriented today. , increase mobility, PT OT  Obesity Estimated body mass index is 28.61 kg/m as calculated from the following:   Height as of this encounter: 5\' 4"  (1.626 m).   Weight as of this encounter: 75.6 kg.   DVT prophylaxis: heparin sq  Code Status: DNR Family  Communication: Spoke with daughter on phone and updated. Disposition Plan:  Status is: Inpatient  Remains inpatient appropriate because:Inpatient level of care appropriate due to severity of illness   Dispo: The patient is from: Home              Anticipated d/c is to: TBD              Patient currently is not medically stable to d/c.   Difficult to place patient No   Consultants:    Nephrology  Procedures: [facility] 3/13  Antimicrobials:   Anti-infectives (From admission, onward)   None      Subjective: Patient was seen and examined in HD suite.  Overnight events noted.   She is alert but  confused, She gets into conversation. She is following commands,  She is oriented x1.  She knows her name but does not know the place.   Objective: Vitals:   10/05/20 1440 10/05/20 1500 10/05/20 1515 10/05/20 1530  BP: (!) 138/50 122/82 (!) 86/32 (!) 144/48  Pulse: 65 (!) 56    Resp:      Temp:      TempSrc:      SpO2:      Weight:      Height:       No intake or output data in the 24 hours ending 10/05/20 1553 Filed Weights   10/03/20 0426  Weight: 76.6 kg    Examination:  General exam: Appears calm and comfortable, not in any acute distress. Respiratory system: Clear to auscultation. Respiratory effort normal. Cardiovascular system: S1 & S2 heard, RRR. No JVD, murmurs, rubs, gallops or clicks. No pedal edema. Gastrointestinal system: Abdomen is nondistended, soft and nontender. No organomegaly or masses felt.  Normal bowel sounds heard. Central nervous system: Alert and oriented x1 . No focal neurological deficits. Extremities: Symmetric 5 x 5 power. Skin: No rashes, lesions or ulcers Psychiatry: Judgement and insight appear normal. Mood & affect appropriate.     Data Reviewed: I have personally reviewed following labs and imaging studies  CBC: Recent Labs  Lab 10/01/20 0327 10/03/20 0136 10/05/20 0052  WBC 10.1 10.4 17.2*  HGB 10.0* 11.4* 12.2  HCT 31.8* 34.1*  37.2  MCV 105.6* 99.1 99.5  PLT 150 209 518   Basic Metabolic Panel: Recent Labs  Lab 09/30/20 1421 10/01/20 0327 10/03/20 0136 10/04/20 0114 10/05/20 0052  NA  --  130* 131* 130* 130*  K  --  4.0 4.3 4.3 3.8  CL  --  92* 95* 93* 93*  CO2  --  26 24 24 24   GLUCOSE  --  169* 174* 225* 185*  BUN  --  44* 44* 25* 38*  CREATININE  --  5.90* 5.38* 3.60* 4.74*  CALCIUM  --  8.7* 9.1 9.0 9.1  MG 2.1  --  2.1  --   --   PHOS  --   --  3.3  --   --    GFR: Estimated Creatinine Clearance: 9.5 mL/min (A) (by C-G formula based on SCr of 4.74 mg/dL (H)). Liver Function Tests: Recent Labs  Lab 10/03/20 0136  AST 20  ALT 29  ALKPHOS 81  BILITOT 0.5  PROT 5.9*  ALBUMIN 2.3*   No results for input(s): LIPASE, AMYLASE in the last 168 hours. No results for input(s): AMMONIA in the last 168 hours. Coagulation Profile: No results for input(s): INR, PROTIME in the last 168 hours. Cardiac Enzymes: No results  for input(s): CKTOTAL, CKMB, CKMBINDEX, TROPONINI in the last 168 hours. BNP (last 3 results) No results for input(s): PROBNP in the last 8760 hours. HbA1C: No results for input(s): HGBA1C in the last 72 hours. CBG: Recent Labs  Lab 10/04/20 1213 10/04/20 1626 10/04/20 2129 10/05/20 0601 10/05/20 1206  GLUCAP 189* 181* 151* 183* 204*   Lipid Profile: No results for input(s): CHOL, HDL, LDLCALC, TRIG, CHOLHDL, LDLDIRECT in the last 72 hours. Thyroid Function Tests: No results for input(s): TSH, T4TOTAL, FREET4, T3FREE, THYROIDAB in the last 72 hours. Anemia Panel: No results for input(s): VITAMINB12, FOLATE, FERRITIN, TIBC, IRON, RETICCTPCT in the last 72 hours. Sepsis Labs: No results for input(s): PROCALCITON, LATICACIDVEN in the last 168 hours.  No results found for this or any previous visit (from the past 240 hour(s)).  Radiology Studies: No results found.  Scheduled Meds: . acetaminophen  650 mg Oral TID  . allopurinol  100 mg Oral q AM  . amiodarone  200 mg  Oral BID  . brimonidine  1 drop Right Eye QHS   And  . brimonidine  2 drop Left Eye QHS  . Chlorhexidine Gluconate Cloth  6 each Topical Q0600  . cinacalcet  30 mg Oral Once per day on Tue Thu  . clopidogrel  75 mg Oral q AM  . dronabinol  5 mg Oral BID AC  . feeding supplement (NEPRO CARB STEADY)  237 mL Oral TID BM  . hydrocortisone   Rectal TID  . hydrocortisone  25 mg Rectal BID  . insulin aspart  0-5 Units Subcutaneous QHS  . insulin aspart  0-6 Units Subcutaneous TID WC  . latanoprost  1 drop Both Eyes QHS  . midodrine  10 mg Oral TID WC  . multivitamin  1 tablet Oral QHS  . polycarbophil  625 mg Oral BID WC  . sevelamer carbonate  1,600 mg Oral BID WC  . sodium chloride flush  3 mL Intravenous Q12H  . timolol  1 drop Left Eye QHS   Continuous Infusions: . sodium chloride    . sodium chloride    . sodium chloride    . sodium chloride    . sodium chloride       LOS: 12 days    Time spent: 25 mins    Shawna Clamp, MD [facility]   If 7PM-7AM, please contact night-coverage

## 2020-10-05 NOTE — Progress Notes (Signed)
Pt came back to rm 9 from dialysis. Reinitiated telel. Vss. Call bell within reach.   Lavenia Atlas, RN

## 2020-10-05 NOTE — Progress Notes (Signed)
Daily Progress Note   Patient Name: Katlynne Mckercher       Date: 10/05/2020 DOB: 09/04/1940  Age: 80 y.o. MRN#: [id number] Attending Physician: Shawna Clamp, MD Primary Care Physician: Josetta Huddle, MD Admit Date: 09/23/2020  Reason for Consultation/Follow-up: Establishing goals of care and Psychosocial/spiritual support  Subjective: Chart review performed. Received report from primary RN - no acute concerns. RN states patient's condition has overall not improved. RN states patient is still with minimal PO intake and is having intermittent confusion/agitation/restlessness. RN reported patient had just been transported to dialysis.  Noted per documentation and per RN report patient's appetite has unfortunately, but unsurprisingly, not improved with Merinol.   Per nephrology note: pt will have to be able to dialyze sitting in a chair for 4-6 hrs to be candidate for discharge to home or SNF. If not able to do this the only other dc option would be LTAC.   Went to patient's room to see if family present and open for PMT visit today - no family/visitors present.    Length of Stay: 12  Current Medications: Scheduled Meds:  . acetaminophen  650 mg Oral TID  . allopurinol  100 mg Oral q AM  . amiodarone  200 mg Oral BID  . brimonidine  1 drop Right Eye QHS   And  . brimonidine  2 drop Left Eye QHS  . Chlorhexidine Gluconate Cloth  6 each Topical Q0600  . cinacalcet  30 mg Oral Once per day on Tue Thu  . clopidogrel  75 mg Oral q AM  . dronabinol  5 mg Oral BID AC  . feeding supplement (NEPRO CARB STEADY)  237 mL Oral TID BM  . hydrocortisone   Rectal TID  . hydrocortisone  25 mg Rectal BID  . insulin aspart  0-5 Units Subcutaneous QHS  . insulin aspart  0-6 Units Subcutaneous TID WC   . latanoprost  1 drop Both Eyes QHS  . midodrine  10 mg Oral TID WC  . multivitamin  1 tablet Oral QHS  . polycarbophil  625 mg Oral BID WC  . sevelamer carbonate  1,600 mg Oral BID WC  . sodium chloride flush  3 mL Intravenous Q12H  . timolol  1 drop Left Eye QHS    Continuous Infusions: . sodium chloride    .  sodium chloride    . sodium chloride    . sodium chloride    . sodium chloride      PRN Meds: sodium chloride, sodium chloride, sodium chloride, sodium chloride, acetaminophen, alteplase, famotidine, heparin, lidocaine (PF), lidocaine-prilocaine, nitroGLYCERIN, oxyCODONE-acetaminophen, pentafluoroprop-tetrafluoroeth, polyvinyl alcohol, witch hazel-glycerin            Vital Signs: BP (!) 131/92   Pulse 90   Temp 98 F (36.7 C) (Oral)   Resp 20   Ht 5\' 4"  (1.626 m)   Wt 76.6 kg   SpO2 93%   BMI 28.99 kg/m  SpO2: SpO2: 93 % O2 Device: O2 Device: Room Air O2 Flow Rate: O2 Flow Rate (L/min): 2 L/min  Intake/output summary: No intake or output data in the 24 hours ending 10/05/20 1613 LBM: Last BM Date: 10/05/20 Baseline Weight: Weight: 77.1 kg Most recent weight: Weight:  (Unable to check bed wt)       Palliative Assessment/Data: PPS 20%    Flowsheet Rows   Flowsheet Row Most Recent Value  Intake Tab   Referral Department Hospitalist  Unit at Time of Referral ICU  Palliative Care Primary Diagnosis Cardiac  Date Notified 09/25/20  Palliative Care Type Return patient Palliative Care  Reason for referral Clarify Goals of Care  Date of Admission 09/23/20  Date first seen by Palliative Care 09/25/20  # of days Palliative referral response time 0 Day(s)  # of days IP prior to Palliative referral 2  Clinical Assessment   Psychosocial & Spiritual Assessment   Palliative Care Outcomes       Patient Active Problem List   Diagnosis Date Noted  . Pressure injury of skin 09/28/2020  . Coronary artery disease involving autologous vein bypass graft 09/25/2020   . Demand ischemia of myocardium ([place]) 09/25/2020  . Syncope and collapse 09/23/2020  . Body mass index (BMI) 32.0-32.9, adult 08/01/2020  . Cervical disc disorder 08/01/2020  . Cough variant asthma 08/01/2020  . Dependence on renal dialysis ([place]) 08/01/2020  . Diabetic renal disease ([place]) 08/01/2020  . Diabetic retinopathy associated with type 2 diabetes mellitus ([place]) 08/01/2020  . Edema 08/01/2020  . Encounter for general adult medical examination without abnormal findings 08/01/2020  . Gout 08/01/2020  . Hereditary and idiopathic neuropathy, unspecified 08/01/2020  . Hyperglycemia due to type 2 diabetes mellitus ([place]) 08/01/2020  . Joint pain 08/01/2020  . 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 Osteoarthritis of knee 08/01/2020  . Other long term (current) drug therapy 08/01/2020  . Other polyosteoarthritis 08/01/2020  . Pain in unspecified knee 08/01/2020  . Proteinuria 08/01/2020  . Pure hypercholesterolemia 08/01/2020  . Pure hypertriglyceridemia 08/01/2020  . Urinary tract infectious disease 08/01/2020  . Vitamin D deficiency 08/01/2020  . Vomiting 08/01/2020  . Other disorders of phosphorus metabolism 01/11/2020  . Encounter for removal of sutures 12/04/2019  . Shortness of breath 07/29/2019  . Anaphylactic shock, unspecified, sequela 04/23/2019  . Secondary hyperparathyroidism of renal origin ([place]) 09/21/2018  . Iron deficiency anemia, unspecified 06/08/2018  . Hemorrhoids   . Diabetes mellitus type 2 in nonobese (HCC)   . Anemia, chronic disease   . Labile blood pressure   . Poorly controlled type 2 diabetes mellitus with peripheral neuropathy ([place])   . Debility   . Benign essential HTN   . Bradycardia   . Ventricular tachycardia ([place])   . Enteritis due to Clostridium difficile   . ESRD on dialysis ([place])   . Diabetic peripheral neuropathy ([place])   . Acute  blood loss anemia   . Anemia of chronic disease   . Aspiration into airway   . NSTEMI (non-ST  elevated myocardial infarction) ([place])   . QT prolongation 04/03/2018  . Acute respiratory failure with hypoxia ([place])   . Aspiration pneumonia due to gastric secretions ([place])   . Cardiac arrest ([place]) 04/02/2018  . Mild protein-calorie malnutrition ([place]) 03/13/2018  . Anxiety 05/13/2017  . Panic attack 05/13/2017  . SAH (subarachnoid hemorrhage) ([place]) 02/03/2017  . Fall 02/03/2017  . Spinal stenosis, lumbar region 03/02/2016  . Trochanteric bursitis of left hip 01/03/2016  . Generalized weakness 12/21/2015  . Hypokalemia 12/21/2015  . Lumbar back pain with radiculopathy affecting left lower extremity 12/04/2015  . Coronary artery disease involving native coronary artery of native heart without angina pectoris   . CRI (chronic renal insufficiency), stage 4 (severe) (HCC)   . Urinary retention   . Anemia of chronic kidney failure   . Hx of gout   . Thrombocytopenia ([place])   . S/P lumbar discectomy 12/01/2015  . 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 Acute encephalopathy   . Chronic kidney disease (CKD), stage V ([place]) 07/13/2015  . Glaucoma   . Paroxysmal atrial fibrillation (HCC)   . Coronary artery disease involving native heart without angina pectoris   . Depression   . Stroke ([place])   . Diabetes mellitus without complication ([place])   . Gastroesophageal reflux disease with esophagitis   . Carotid artery stenosis 04/21/2015  . Essential hypertension 06/08/2014  . Hyperlipidemia associated with type 2 diabetes mellitus ([place]) 06/08/2014  . Occlusion and stenosis of carotid artery without mention of cerebral infarction 01/01/2012    Palliative Care Assessment & Plan   Patient Profile: 80 y.o.femalewith past medical history of ESRD on dialysis TTS, coronary artery disease s/p CABG, carotid stenosis, atrial fibrillation, previous history of non-sustained v-tach, DM2, HTN, HLD, and hearing loss presenting  to the emergency departmenton3/12/2022with multiple episodes of syncope.She reportedly had an episode right after dialysis. Had another episode at home, became unresponsive, and family started CPR. When EMS arrived, monitor showed nonsustained v-tach bigeminy, lidocaine infusion was started.  ED Course: On arrival to ED patient was back in sinus rhythm. Chest x-ray showed left lower lobe consolidation probably atelectasis versus infection or aspiration. She was admitted to [facility] for further evaluation of syncopal episodes.   On 3/13, patient was undergoing echocardiogram and developed symptomatic v-tach requiring CPR. She underwent emergent cardiac cath with findings of new 100% stenosis in multiple vessels. Patient was seen by EP, but is not a candidate for ICD.  Assessment: Syncopal episodes with collapse Symptomatic ventricular tachycardia Paroxysmal atrial fibrillation Coronary artery disease DM with nephropathy Essential hypertension ESRD on HD Anemia of chronic disease Generalized debility Deconditioned state Failure to thrive  Recommendations/Plan:  Continue current full scope medical treatment  Continue DNR/DNI as previously documented  Daughter expressed understanding during previous visits that patient is approaching EOL. If patient is eating, goal is to continue dialysis. If patient stops eating, family will consider stopping dialysis and transition to hospice. Daughter not interested in pursuing PEG.  Patient's short term goal is to see her granddaughter get married in mid-April.   Continue Marinol 5mg  PO BID before lunch and dinner for appetite stimulant. Will discuss discontinuing with daughter tomorrow 3/25 since it does not seem to be offering any benefit.  Now with patient's increased agitation/restlessness, would no longer consider steroid use  Continue  Tylenol 650mg  PO TID for sacral pain/discomfort, q2h turns, and air mattress   Ongoing GOC discussions pending  clinical course  PMT will continue to follow and support holistically  Goals of Care and Additional Recommendations:  Limitations on Scope of Treatment: Full Scope Treatment  Code Status:    Code Status Orders  (From admission, onward)         Start     Ordered   09/25/20 1446  Do not attempt resuscitation (DNR)  Continuous       Question Answer Comment  In the event of cardiac or respiratory ARREST Do not call a "code blue"   In the event of cardiac or respiratory ARREST Do not perform Intubation, CPR, defibrillation or ACLS   In the event of cardiac or respiratory ARREST Use medication by any route, position, wound care, and other measures to relive pain and suffering. May use oxygen, suction and manual treatment of airway obstruction as needed for comfort.      09/25/20 1445        Code Status History    Date Active Date Inactive Code Status Order ID Comments User Context   09/25/2020 1258 09/25/2020 1445 Partial Code [id number]  Vickii Penna, RN Inpatient   09/24/2020 0155 09/25/2020 1258 Full Code [id number]  Elwyn Reach, MD Inpatient   04/16/2018 1024 04/28/2018 1821 Partial Code [id number]  Roney Jaffe, MD Inpatient   04/15/2018 1620 04/15/2018 1621 DNR [id number]  Cathlyn Parsons, PA-C Inpatient   04/15/2018 1620 04/15/2018 1620 Full Code [id number]  Cathlyn Parsons, PA-C Inpatient   04/13/2018 1711 04/15/2018 1542 DNR [id number]  Roney Jaffe, MD Inpatient   04/02/2018 2040 04/13/2018 1711 Full Code [id number]  Germain Osgood, PA-C ED   12/21/2015 1721 12/25/2015 1606 Partial Code [id number]  Samella Parr, NP Inpatient   12/04/2015 1653 12/15/2015 1451 Full Code [id number]  Bary Leriche, PA-C Inpatient   07/13/2015 1955 07/17/2015 1637 Partial Code [id number]  Ivor Costa, MD ED   04/21/2015 1658 04/22/2015 1624 Full Code [id number]  Gabriel Earing, PA-C Inpatient   07/29/2011 1802 08/03/2011 1434 Full Code [id number]  Raquel James, RN Inpatient   Advance Care  Planning Activity       Prognosis:   < 6 months  Discharge Planning:  To Be Determined  Care plan was discussed with primary RN  Thank you for allowing the Palliative Medicine Team to assist in the care of this patient.   Total Time 15 minutes Prolonged Time Billed  no       Greater than 50%  of this time was spent counseling and coordinating care related to the above assessment and plan.  Lin Landsman, NP  Please contact Palliative Medicine Team phone at [phone] for questions and concerns.

## 2020-10-05 NOTE — Progress Notes (Signed)
[facility] Progress Note  Subjective:  Seen in room, no new /co    Vitals:   10/04/20 1514 10/04/20 2025 10/04/20 2304 10/05/20 0740  BP: (!) 120/51 (!) 110/42 (!) 109/30 (!) 122/39  Pulse: 65 65 60 78  Resp: 20 18 17 18   Temp: 97.8 F (36.6 C) 98.1 F (36.7 C) 98.8 F (37.1 C) 98.2 F (36.8 C)  TempSrc: Oral Oral Oral Oral  SpO2: 95% 95% 97% 96%  Weight:      Height:        Exam General: Chronically ill appearing female, nad Heart: RRR, no murmur Lungs: CTA anteriorly without wheezing or rales Abdomen: Soft, non-distended, +BS. Extremities: minimal edema b/l lower extremities Dialysis Access: LUE AVF + bruit; maturing RUA AVF+bruit     OP HD: [facility] TTS 4h  76.5kg  3K/Ca 2.5 bath  Hep none  LUE AVF - 100 mircera q 2 wks, last 3/08     Assessment/ Plan: 1. VT/ cardiac arrest/ CAD hx of CABG: cath 09/25/31 with severe native disease and new 100% ostial + prox CTO of SVG RCA treated w/ stenting. Seen by EP, not an ICD candidate. Treating medically , on po amiodarone.  2. ESRD - on HD TTS. For HD today.   3. HD access: due to mild steal symptoms in L hand, VVS placed new RUA BC AVF on 07/03/20, with the idea of ligating the L arm AVF when the R AVF is being used successfully.  4. Anemia ckd: Hgb 11.4 on 3/22. No esa given here. Hgb stable.  5. CKD-MBD: Checking RFP, continue binders and vits 6. Nutrition: protein supps 7. Hypotension/volume: BP's 80's- 100's. Midodrine 10 pre HD tiw started. At her dry wt.  8. DM2- on insulin, per pmd 9. DNR 10. Debility/ deconditioning/ FTT - pt will have to be able to dialyze sitting in a chair for 4-6 hrs to be candidate for discharge to home or SNF. If not able to do this the only other dc option would be LTAC.  11. EOL -  pall care working w/ pt and family     [name] 10/05/2020, 10:30 AM   Recent Labs  Lab 10/03/20 0136 10/04/20 0114 10/05/20 0052  K 4.3 4.3 3.8  BUN 44* 25* 38*  CREATININE 5.38*  3.60* 4.74*  CALCIUM 9.1 9.0 9.1  PHOS 3.3  --   --   HGB 11.4*  --  12.2   Inpatient medications: . acetaminophen  650 mg Oral TID  . allopurinol  100 mg Oral q AM  . amiodarone  200 mg Oral BID  . brimonidine  1 drop Right Eye QHS   And  . brimonidine  2 drop Left Eye QHS  . Chlorhexidine Gluconate Cloth  6 each Topical Q0600  . cinacalcet  30 mg Oral Once per day on Tue Thu  . clopidogrel  75 mg Oral q AM  . dronabinol  5 mg Oral BID AC  . feeding supplement (NEPRO CARB STEADY)  237 mL Oral TID BM  . hydrocortisone   Rectal TID  . hydrocortisone  25 mg Rectal BID  . insulin aspart  0-5 Units Subcutaneous QHS  . insulin aspart  0-6 Units Subcutaneous TID WC  . latanoprost  1 drop Both Eyes QHS  . midodrine  10 mg Oral TID WC  . multivitamin  1 tablet Oral QHS  . polycarbophil  625 mg Oral BID WC  . sevelamer carbonate  1,600 mg Oral BID  WC  . sodium chloride flush  3 mL Intravenous Q12H  . timolol  1 drop Left Eye QHS   . sodium chloride    . sodium chloride    . sodium chloride     sodium chloride, sodium chloride, acetaminophen, alteplase, famotidine, heparin, lidocaine (PF), lidocaine-prilocaine, nitroGLYCERIN, oxyCODONE-acetaminophen, pentafluoroprop-tetrafluoroeth, polyvinyl alcohol, witch hazel-glycerin

## 2020-10-06 ENCOUNTER — Inpatient Hospital Stay (HOSPITAL_COMMUNITY): Payer: Medicare Other

## 2020-10-06 DIAGNOSIS — R110 Nausea: Secondary | ICD-10-CM

## 2020-10-06 LAB — CBC
HCT: 37.3 % (ref 36.0–46.0)
Hemoglobin: 12.3 g/dL (ref 12.0–15.0)
MCH: 32.9 pg (ref 26.0–34.0)
MCHC: 33 g/dL (ref 30.0–36.0)
MCV: 99.7 fL (ref 80.0–100.0)
Platelets: 252 10*3/uL (ref 150–400)
RBC: 3.74 MIL/uL — ABNORMAL LOW (ref 3.87–5.11)
RDW: 15.6 % — ABNORMAL HIGH (ref 11.5–15.5)
WBC: 22.3 10*3/uL — ABNORMAL HIGH (ref 4.0–10.5)
nRBC: 0 % (ref 0.0–0.2)

## 2020-10-06 LAB — BASIC METABOLIC PANEL
Anion gap: 11 (ref 5–15)
BUN: 21 mg/dL (ref 8–23)
CO2: 29 mmol/L (ref 22–32)
Calcium: 9 mg/dL (ref 8.9–10.3)
Chloride: 94 mmol/L — ABNORMAL LOW (ref 98–111)
Creatinine, Ser: 3.47 mg/dL — ABNORMAL HIGH (ref 0.44–1.00)
GFR, Estimated: 13 mL/min — ABNORMAL LOW (ref >60)
Glucose, Bld: 173 mg/dL — ABNORMAL HIGH (ref 70–99)
Potassium: 3.4 mmol/L — ABNORMAL LOW (ref 3.5–5.1)
Sodium: 134 mmol/L — ABNORMAL LOW (ref 135–145)

## 2020-10-06 LAB — GLUCOSE, CAPILLARY
Glucose-Capillary: 153 mg/dL — ABNORMAL HIGH (ref 70–99)
Glucose-Capillary: 168 mg/dL — ABNORMAL HIGH (ref 70–99)

## 2020-10-06 MED ORDER — PROCHLORPERAZINE EDISYLATE 10 MG/2ML IJ SOLN
10.0000 mg | Freq: Four times a day (QID) | INTRAMUSCULAR | Status: DC | PRN
  Filled 2020-10-06: qty 2

## 2020-10-06 MED ORDER — PROCHLORPERAZINE EDISYLATE 10 MG/2ML IJ SOLN: 10.0000 mg | Freq: Three times a day (TID) | INTRAMUSCULAR | Status: DC

## 2020-10-06 NOTE — Progress Notes (Signed)
[facility] Progress Note  Subjective:  Seen in room, no new /co    Vitals:   10/06/20 0012 10/06/20 0212 10/06/20 0412 10/06/20 1135  BP:   (!) 140/42 (!) 142/47  Pulse:   64 69  Resp: (!) 30 19 19 18   Temp:   98 F (36.7 C) 98.6 F (37 C)  TempSrc:   Oral Oral  SpO2:   94% 99%  Weight:      Height:        Exam General: Chronically ill appearing female, nad Heart: RRR, no murmur Lungs: CTA anteriorly without wheezing or rales Abdomen: Soft, non-distended, +BS. Extremities: minimal edema b/l lower extremities Dialysis Access: LUE AVF + bruit; maturing RUA AVF+bruit     OP HD: NW TTS 4h  76.5kg  3K/Ca 2.5 bath  Hep none  LUE AVF - 100 mircera q 2 wks, last 3/08     Assessment/ Plan: 1. VT/ cardiac arrest/ CAD hx of CABG: cath 09/25/31 with severe native disease and new 100% ostial + prox CTO of SVG RCA treated w/ stenting. Seen by EP, not an ICD candidate. Treating medically , on po amiodarone.  2. ESRD - on HD TTS. For HD tomorrow.  3. HD access: due to mild steal symptoms in L hand, VVS placed new RUA BC AVF on 07/03/20, with the idea of ligating the L arm AVF when the R AVF is being used successfully.  4. Anemia ckd: Hgb 11.4 on 3/22. No esa given here. Hgb stable.  5. CKD-MBD: Checking RFP, continue binders and vits 6. Nutrition: protein supps 7. Hypotension/volume: BP's 80's- 100's. Midodrine 10 pre HD tiw started. At her dry wt.  8. DM2- on insulin, per pmd 9. DNR 10. Debility/ deconditioning/ FTT - pt will have to be able to dialyze sitting in a chair for 4-6 hrs to be candidate for discharge to home or SNF. If not able to do this the only other dc option would be LTAC.  11. EOL -  pall care working w/ pt and family     [name] 10/06/2020, 4:19 PM   Recent Labs  Lab 10/03/20 0136 10/04/20 0114 10/05/20 0052 10/06/20 0217 10/06/20 1008  K 4.3   < > 3.8  --  3.4*  BUN 44*   < > 38*  --  21  CREATININE 5.38*   < > 4.74*  --  3.47*   CALCIUM 9.1   < > 9.1  --  9.0  PHOS 3.3  --   --   --   --   HGB 11.4*  --  12.2 12.3  --    < > = values in this interval not displayed.   Inpatient medications: . acetaminophen  650 mg Oral TID  . allopurinol  100 mg Oral q AM  . amiodarone  200 mg Oral BID  . brimonidine  1 drop Right Eye QHS   And  . brimonidine  2 drop Left Eye QHS  . Chlorhexidine Gluconate Cloth  6 each Topical Q0600  . cinacalcet  30 mg Oral Once per day on Tue Thu  . clopidogrel  75 mg Oral q AM  . dronabinol  5 mg Oral BID AC  . feeding supplement (NEPRO CARB STEADY)  237 mL Oral TID BM  . hydrocortisone   Rectal TID  . hydrocortisone  25 mg Rectal BID  . insulin aspart  0-6 Units Subcutaneous TID WC  . latanoprost  1 drop Both Eyes  QHS  . midodrine  10 mg Oral TID WC  . multivitamin  1 tablet Oral QHS  . polycarbophil  625 mg Oral BID WC  . sevelamer carbonate  1,600 mg Oral BID WC  . sodium chloride flush  3 mL Intravenous Q12H  . timolol  1 drop Left Eye QHS   . sodium chloride    . sodium chloride    . sodium chloride     sodium chloride, sodium chloride, acetaminophen, alteplase, famotidine, heparin, lidocaine (PF), lidocaine-prilocaine, nitroGLYCERIN, oxyCODONE-acetaminophen, pentafluoroprop-tetrafluoroeth, polyvinyl alcohol, witch hazel-glycerin

## 2020-10-06 NOTE — Progress Notes (Addendum)
PROGRESS NOTE    [name]  XKG:[id number] DOB: 09/08/1940 DOA: 09/23/2020 PCP: Josetta Huddle, MD    Brief Narrative:  This 80 y.o.femalewith medical history significant ofend-stage renal disease on hemodialysis TTS, coronary artery disease, carotid stenosis, atrial fibrillation, previous history of nonsustained V. tach, diabetes, essential hypertension, hyperlipidemia, hard of hearing who presented with multiple episodes of syncope prior to admission, later found to have vtach by EMS. Pt was admitted for further work up.  Patient was scheduled to undergo left heart cath on 3/14 when she sustained an episode of symptomatic V. Tach (code blue), requiring brief chest compressions with ROSC. Later she underwent emergent cath on 3/13 with new 100% ostial and proximal CTO of SVG RCA. EP was consulted, not a candidate for ICD.  CT angio showed no PE.  Patient continued to have several episodes of nonsustained V. tach or ventricular bigeminy, hypoxia. Cardiology recommended amiodarone, no beta-blocker due to bradycardia. Palliative medicine following, goals of care discussed, DNR/DNI, for now,  continue full scope medical treatment.  Nephrology following for hemodialysis.  Assessment & Plan:   Principal Problem:   Ventricular tachycardia ([place]) Active Problems:   Essential hypertension   Hyperlipidemia associated with type 2 diabetes mellitus (HCC)   Paroxysmal atrial fibrillation (HCC)   Coronary artery disease involving native heart without angina pectoris   Hypokalemia   QT prolongation   ESRD on dialysis (HCC)   Anemia of chronic disease   Diabetes mellitus type 2 in nonobese [facility])   Syncope and collapse   Coronary artery disease involving autologous vein bypass graft   Demand ischemia of myocardium (HCC)   Pressure injury of skin   Recurrent symptomatic ventricular tachycardia with multiple syncopal episodes ([place]), hypotension -Cardiac cath on 3/13 with findings of new 100%  ostial and proximal CTO of SVG RCA. -Seen by EP, not a candidate for ICD.  No further invasive intervention from cardiac standpoint. -Continue aspirin, Plavix, statin -Cardiology following, now transitioned to oral amiodarone. -Palliative medicine was consulted, currently goals DNR/DNI, continue full scope medical treatment.   -Per nephrology, will Continue dialysis if patient is able to tolerate, will consider transitioning to comfort care if she cannot tolerate. 10/01/2020: Cardiology input is highly appreciated.  Paroxysmal atrial fibrillation-postop -Received amiodarone bolus with drip, now transitioned to oral amiodarone. -Cardiology following, not a candidate for anticoagulation due to previous brain bleed.  Diabetes mellitus type 2, IDDM, with nephropathy -Hemoglobin A1c 5.0 -Continue sliding scale insulin  Essential hypertension: Currently on midodrine.  Improving.  ESRD on hemodialysis, TTS -Nephrology following, plan to continue HD and assess if she is able to tolerate.   If patient is not able to tolerate HD, will consider pursuing comfort care.  Palliative medicine is following 10/01/2020: Patient underwent hemodialysis. Next HD 3/24. She is tolerating hemodialysis. Continue as per schedule. Patient should be able to dialyze sitting in the chair for 4 to 6 hours for  to be considered to be discharged home or nursing home, other option would be LTAC.  Anemia of chronic disease : -Continue ESA, H&H currently stable.  Hemoglobin is currently at goal.  Transaminitis : -Possibly due to #1, hypotension, no acute complaints/abdominal pain -LFTs back to normal.  Generalized debility, very deconditioned, FTT -Slightly alert and oriented today. , increase mobility, PT OT. -Patient's appetite has unfortunately not improved with Marinol. -Family understand that she is approaching end-of-life.   Obesity Estimated body mass index is 28.61 kg/m as calculated from the  following:   Height  as of this encounter: 5\' 4"  (1.626 m).   Weight as of this encounter: 75.6 kg.   DVT prophylaxis: heparin sq Code Status: DNR Family Communication: Spoke with daughter on phone and updated. Disposition Plan:  Status is: Inpatient  Remains inpatient appropriate because:Inpatient level of care appropriate due to severity of illness   Dispo: The patient is from: Home              Anticipated d/c is to: TBD              Patient currently is not medically stable to d/c.   Difficult to place patient No   Consultants:    Nephrology  Procedures: [facility] 3/13  Antimicrobials:   Anti-infectives (From admission, onward)   None      Subjective: Patient was seen and examined in HD suite.  Overnight events noted.   She is alert but confused, She gets into conversation then go back to sleep.  She is following commands, she reports feeling tired.   Objective: Vitals:   10/06/20 0012 10/06/20 0212 10/06/20 0412 10/06/20 1135  BP:   (!) 140/42 (!) 142/47  Pulse:   64 69  Resp: (!) 30 19 19 18   Temp:   98 F (36.7 C) 98.6 F (37 C)  TempSrc:   Oral Oral  SpO2:   94% 99%  Weight:      Height:        Intake/Output Summary (Last 24 hours) at 10/06/2020 1329 Last data filed at 10/05/2020 1818 Gross per 24 hour  Intake --  Output 1200 ml  Net -1200 ml   Filed Weights   10/03/20 0426  Weight: 76.6 kg    Examination:  General exam: Appears calm and comfortable, not in any acute distress. Respiratory system: Clear to auscultation. Respiratory effort normal. Cardiovascular system: S1 & S2 heard, RRR. No JVD, murmurs, rubs, gallops or clicks. No pedal edema. Gastrointestinal system: Abdomen is nondistended, soft and nontender. No organomegaly or masses felt.  Normal bowel sounds heard. Central nervous system: Alert and oriented x1 . No focal neurological deficits. Extremities: Symmetric 5 x 5 power. Skin: No rashes, lesions or ulcers Psychiatry: Judgement  and insight appear normal. Mood & affect appropriate.     Data Reviewed: I have personally reviewed following labs and imaging studies  CBC: Recent Labs  Lab 10/01/20 0327 10/03/20 0136 10/05/20 0052 10/06/20 0217  WBC 10.1 10.4 17.2* 22.3*  HGB 10.0* 11.4* 12.2 12.3  HCT 31.8* 34.1* 37.2 37.3  MCV 105.6* 99.1 99.5 99.7  PLT 150 209 233 702   Basic Metabolic Panel: Recent Labs  Lab 09/30/20 1421 10/01/20 0327 10/03/20 0136 10/04/20 0114 10/05/20 0052 10/06/20 1008  NA  --  130* 131* 130* 130* 134*  K  --  4.0 4.3 4.3 3.8 3.4*  CL  --  92* 95* 93* 93* 94*  CO2  --  26 24 24 24 29   GLUCOSE  --  169* 174* 225* 185* 173*  BUN  --  44* 44* 25* 38* 21  CREATININE  --  5.90* 5.38* 3.60* 4.74* 3.47*  CALCIUM  --  8.7* 9.1 9.0 9.1 9.0  MG 2.1  --  2.1  --   --   --   PHOS  --   --  3.3  --   --   --    GFR: Estimated Creatinine Clearance: 13 mL/min (A) (by C-G formula based on SCr of 3.47 mg/dL (H)). Liver Function Tests: Recent  Labs  Lab 10/03/20 0136  AST 20  ALT 29  ALKPHOS 81  BILITOT 0.5  PROT 5.9*  ALBUMIN 2.3*   No results for input(s): LIPASE, AMYLASE in the last 168 hours. No results for input(s): AMMONIA in the last 168 hours. Coagulation Profile: No results for input(s): INR, PROTIME in the last 168 hours. Cardiac Enzymes: No results for input(s): CKTOTAL, CKMB, CKMBINDEX, TROPONINI in the last 168 hours. BNP (last 3 results) No results for input(s): PROBNP in the last 8760 hours. HbA1C: No results for input(s): HGBA1C in the last 72 hours. CBG: Recent Labs  Lab 10/05/20 1206 10/05/20 1913 10/05/20 2134 10/06/20 0601 10/06/20 1136  GLUCAP 204* 185* 181* 153* 168*   Lipid Profile: No results for input(s): CHOL, HDL, LDLCALC, TRIG, CHOLHDL, LDLDIRECT in the last 72 hours. Thyroid Function Tests: No results for input(s): TSH, T4TOTAL, FREET4, T3FREE, THYROIDAB in the last 72 hours. Anemia Panel: No results for input(s): VITAMINB12, FOLATE,  FERRITIN, TIBC, IRON, RETICCTPCT in the last 72 hours. Sepsis Labs: No results for input(s): PROCALCITON, LATICACIDVEN in the last 168 hours.  No results found for this or any previous visit (from the past 240 hour(s)).  Radiology Studies: DG CHEST PORT 1 VIEW  Result Date: 10/06/2020 CLINICAL DATA:  Shortness of breath EXAM: PORTABLE CHEST 1 VIEW COMPARISON:  September 23, 2020 chest radiograph; September 24, 2020 FINDINGS: There is a pleural effusion on the right with ill-defined underlying opacity, consistent with atelectasis and/or degree of pneumonia. There is consolidation in the medial left base. Left lung is otherwise clear. Heart is upper normal in size with pulmonary vascularity normal. Patient is status post coronary artery bypass grafting. There is aortic atherosclerosis. There is a stent in the left brachial region. No bone lesions. No adenopathy evident. IMPRESSION: Pleural effusion on the right. Suspect underlying atelectasis and potential patchy infiltrate. Consolidation medial left base raises concern for atelectasis with superimposed pneumonia. Stable cardiac prominence. Status post coronary artery bypass grafting. Aortic Atherosclerosis (ICD10-I70.0). Electronically Signed   By: Lowella Grip III M.D.   On: 10/06/2020 09:02    Scheduled Meds: . acetaminophen  650 mg Oral TID  . allopurinol  100 mg Oral q AM  . amiodarone  200 mg Oral BID  . brimonidine  1 drop Right Eye QHS   And  . brimonidine  2 drop Left Eye QHS  . Chlorhexidine Gluconate Cloth  6 each Topical Q0600  . cinacalcet  30 mg Oral Once per day on Tue Thu  . clopidogrel  75 mg Oral q AM  . dronabinol  5 mg Oral BID AC  . feeding supplement (NEPRO CARB STEADY)  237 mL Oral TID BM  . hydrocortisone   Rectal TID  . hydrocortisone  25 mg Rectal BID  . insulin aspart  0-6 Units Subcutaneous TID WC  . latanoprost  1 drop Both Eyes QHS  . midodrine  10 mg Oral TID WC  . multivitamin  1 tablet Oral QHS  .  polycarbophil  625 mg Oral BID WC  . sevelamer carbonate  1,600 mg Oral BID WC  . sodium chloride flush  3 mL Intravenous Q12H  . timolol  1 drop Left Eye QHS   Continuous Infusions: . sodium chloride    . sodium chloride    . sodium chloride       LOS: 13 days    Time spent: 25 mins    Shawna Clamp, MD [facility]   If 7PM-7AM, please contact  night-coverage

## 2020-10-06 NOTE — Progress Notes (Signed)
Daily Progress Note   Patient Name: [name]       Date: 10/06/2020 DOB: [birth year]  Age: 80 y.o. MRN#: [id number] Attending Physician: Shawna Clamp, MD Primary Care Physician: Josetta Huddle, MD Admit Date: 09/23/2020  Reason for Consultation/Follow-up: Establishing goals of care, Non pain symptom management and Pain control  Subjective: Chart review performed. Received report from primary RN - no acute concerns. RN reports patient still has minimal PO intake (ate about 25% of breakfast).  Went to visit patient at bedside - no family/visitors present. Patient was lying in bed asleep - she does wake to voice/gentle touch. No signs or non-verbal gestures of pain or discomfort noted. No respiratory distress, increased work of breathing, or secretions noted. Patient states her pain is "not too bad right now" but she has been "sitting here feeling nauseated." I encouraged her to express her symptoms to her nurse so we could address them - explained I would order nausea medication for her. Otherwise, [name] states "today is a good day."  Therapeutic listening provided to [name] as she reflects on her health and knowing that her time is limited. She has tried to talk with her family about funeral planning but her family does not seem receptive. I asked how she felt about continuing dialysis - she states "it wears me out" and "I have no energy" after treatments. Validated that going through dialysis treatments can be very hard. She states that she has tried to talk with her family about stopping, but they tell her to "keep going." She is ok with continuing treatments at this time. I encouraged her to continue these conversations with her family as they are important - patient understands. I  discussed with [name] that if she decided she wanted to stop treatments, PMT could assist her in navigating that conversation with her family - she expressed appreciation.  [name] is oriented with intermittent periods of confusion and falling asleep during my visit today. She expresses gratitude for PMT visit today.  Spoke with pharmacist to verify compazine is not contraindicated in patient's with renal failure.  4:15 PM Attempted to call daughter/[name] to offer support and updates - no answer - confidential voicemail left and PMT phone number provided with request to return call.    Length of Stay: 13  Current Medications: Scheduled Meds:  . acetaminophen  650 mg Oral TID  . allopurinol  100 mg Oral q AM  . amiodarone  200 mg Oral BID  . brimonidine  1 drop Right Eye QHS   And  . brimonidine  2 drop Left Eye QHS  . Chlorhexidine Gluconate Cloth  6 each Topical Q0600  . cinacalcet  30 mg Oral Once per day on Tue Thu  . clopidogrel  75 mg Oral q AM  . dronabinol  5 mg Oral BID AC  . feeding supplement (NEPRO CARB STEADY)  237 mL Oral TID BM  . hydrocortisone   Rectal TID  . hydrocortisone  25 mg Rectal BID  . insulin aspart  0-6 Units Subcutaneous TID WC  . latanoprost  1 drop Both Eyes QHS  . midodrine  10 mg Oral TID WC  . multivitamin  1 tablet Oral QHS  . polycarbophil  625 mg Oral BID WC  . sevelamer carbonate  1,600 mg Oral BID WC  . sodium chloride flush  3 mL Intravenous Q12H  . timolol  1 drop Left Eye QHS    Continuous Infusions: . sodium chloride    . sodium chloride    . sodium chloride      PRN Meds: sodium chloride, sodium chloride, acetaminophen, alteplase, famotidine, heparin, lidocaine (PF), lidocaine-prilocaine, nitroGLYCERIN, oxyCODONE-acetaminophen, pentafluoroprop-tetrafluoroeth, polyvinyl alcohol, witch hazel-glycerin  Physical Exam Vitals and nursing note reviewed.  Constitutional:      General: She is not in acute distress.     Appearance: She is ill-appearing.  Pulmonary:     Effort: No respiratory distress.  Skin:    General: Skin is warm and dry.  Neurological:     Mental Status: She is oriented to person, place, and time. She is lethargic.     Motor: Weakness present.     Comments: Confused at times  Psychiatric:        Attention and Perception: Attention normal.        Behavior: Behavior is cooperative.             Vital Signs: BP (!) 142/47 (BP Location: Right Arm)   Pulse 69   Temp 98.6 F (37 C) (Oral)   Resp 18   Ht 5\' 4"  (1.626 m)   Wt 76.6 kg   SpO2 99%   BMI 28.99 kg/m  SpO2: SpO2: 99 % O2 Device: O2 Device: Room Air O2 Flow Rate: O2 Flow Rate (L/min): 2 L/min  Intake/output summary:   Intake/Output Summary (Last 24 hours) at 10/06/2020 1615 Last data filed at 10/05/2020 1818 Gross per 24 hour  Intake -  Output 1200 ml  Net -1200 ml   LBM: Last BM Date: 10/06/20 Baseline Weight: Weight: 77.1 kg Most recent weight: Weight:  (Unable to check bed wt)        Palliative Assessment/Data: PPS 20%    Flowsheet Rows   Flowsheet Row Most Recent Value  Intake Tab   Referral Department Hospitalist  Unit at Time of Referral ICU  Palliative Care Primary Diagnosis Cardiac  Date Notified 09/25/20  Palliative Care Type Return patient Palliative Care  Reason for referral Clarify Goals of Care  Date of Admission 09/23/20  Date first seen by Palliative Care 09/25/20  # of days Palliative referral response time 0 Day(s)  # of days IP prior to Palliative referral 2  Clinical Assessment   Psychosocial & Spiritual Assessment   Palliative Care Outcomes       Patient  Active Problem List   Diagnosis Date Noted  . Pressure injury of skin 09/28/2020  . Coronary artery disease involving autologous vein bypass graft 09/25/2020  . Demand ischemia of myocardium ([place]) 09/25/2020  . Syncope and collapse 09/23/2020  . Body mass index (BMI) 32.0-32.9, adult 08/01/2020  . Cervical disc disorder  08/01/2020  . Cough variant asthma 08/01/2020  . Dependence on renal dialysis ([place]) 08/01/2020  . Diabetic renal disease ([place]) 08/01/2020  . Diabetic retinopathy associated with type 2 diabetes mellitus ([place]) 08/01/2020  . Edema 08/01/2020  . Encounter for general adult medical examination without abnormal findings 08/01/2020  . Gout 08/01/2020  . Hereditary and idiopathic neuropathy, unspecified 08/01/2020  . Hyperglycemia due to type 2 diabetes mellitus ([place]) 08/01/2020  . Joint pain 08/01/2020  . 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 Osteoarthritis of knee 08/01/2020  . Other long term (current) drug therapy 08/01/2020  . Other polyosteoarthritis 08/01/2020  . Pain in unspecified knee 08/01/2020  . Proteinuria 08/01/2020  . Pure hypercholesterolemia 08/01/2020  . Pure hypertriglyceridemia 08/01/2020  . Urinary tract infectious disease 08/01/2020  . Vitamin D deficiency 08/01/2020  . Vomiting 08/01/2020  . Other disorders of phosphorus metabolism 01/11/2020  . Encounter for removal of sutures 12/04/2019  . Shortness of breath 07/29/2019  . Anaphylactic shock, unspecified, sequela 04/23/2019  . Secondary hyperparathyroidism of renal origin ([place]) 09/21/2018  . Iron deficiency anemia, unspecified 06/08/2018  . Hemorrhoids   . Diabetes mellitus type 2 in nonobese (HCC)   . Anemia, chronic disease   . Labile blood pressure   . Poorly controlled type 2 diabetes mellitus with peripheral neuropathy ([place])   . Debility   . Benign essential HTN   . Bradycardia   . Ventricular tachycardia ([place])   . Enteritis due to Clostridium difficile   . ESRD on dialysis ([place])   . Diabetic peripheral neuropathy ([place])   . Acute blood loss anemia   . Anemia of chronic disease   . Aspiration into airway   . NSTEMI (non-ST elevated myocardial infarction) ([place])   . QT prolongation 04/03/2018  . Acute respiratory failure with hypoxia ([place])   . Aspiration pneumonia due to gastric secretions  ([place])   . Cardiac arrest ([place]) 04/02/2018  . Mild protein-calorie malnutrition ([place]) 03/13/2018  . Anxiety 05/13/2017  . Panic attack 05/13/2017  . SAH (subarachnoid hemorrhage) ([place]) 02/03/2017  . Fall 02/03/2017  . Spinal stenosis, lumbar region 03/02/2016  . Trochanteric bursitis of left hip 01/03/2016  . Generalized weakness 12/21/2015  . Hypokalemia 12/21/2015  . Lumbar back pain with radiculopathy affecting left lower extremity 12/04/2015  . Coronary artery disease involving native coronary artery of native heart without angina pectoris   . CRI (chronic renal insufficiency), stage 4 (severe) (HCC)   . Urinary retention   . Anemia of chronic kidney failure   . Hx of gout   . Thrombocytopenia ([place])   . S/P lumbar discectomy 12/01/2015  . 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 Acute encephalopathy   . Chronic kidney disease (CKD), stage V ([place]) 07/13/2015  . Glaucoma   . Paroxysmal atrial fibrillation (HCC)   . Coronary artery disease involving native heart without angina pectoris   . Depression   . Stroke ([place])   . Diabetes mellitus without complication ([place])   . Gastroesophageal reflux disease with esophagitis   . Carotid artery stenosis 04/21/2015  . Essential hypertension 06/08/2014  . Hyperlipidemia associated with type  2 diabetes mellitus ([place]) 06/08/2014  . Occlusion and stenosis of carotid artery without mention of cerebral infarction 01/01/2012    Palliative Care Assessment & Plan   Patient Profile: 80 y.o.femalewith past medical history of ESRD on dialysis TTS, coronary artery disease s/p CABG, carotid stenosis, atrial fibrillation, previous history of non-sustained v-tach, DM2, HTN, HLD, and hearing loss presenting to the emergency departmenton3/12/2022with multiple episodes of syncope.She reportedly had an episode right after dialysis. Had another episode at home, became  unresponsive, and family started CPR. When EMS arrived, monitor showed nonsustained v-tach bigeminy, lidocaine infusion was started.  ED Course: On arrival to ED patient was back in sinus rhythm. Chest x-ray showed left lower lobe consolidation probably atelectasis versus infection or aspiration. She was admitted to [facility] for further evaluation of syncopal episodes.   On 3/13, patient was undergoing echocardiogram and developed symptomatic v-tach requiring CPR. She underwent emergent cardiac cath with findings of new 100% stenosis in multiple vessels. Patient was seen by EP, but is not a candidate for ICD.  Assessment: Syncopal episodes with collapse Symptomatic ventricular tachycardia Paroxysmal atrial fibrillation Coronary artery disease DM with nephropathy Essential hypertension ESRD on HD Anemia of chronic disease Generalized debility Deconditioned state Failure to thrive  Recommendations/Plan:  Continue current full scope medical treatment  Continue DNR/DNI as previously documented  Daughter expressed understanding during previous visits that patient is approaching EOL.Ifpatient is eating, goal is to continue dialysis. Ifpatient stops eating, family will consider stopping dialysis and transition to hospice. Daughter not interested in pursuing PEG.  Patient's short term goal is to see her granddaughter get married in mid-April.  DiscontinuedMarinol as it is not offering any benefit and is pill burden. Now with patient's increased agitation/restlessness, would no longer consider steroid use  Ordered compazine 10mg  q6h PRN nausea/vomiting  Continue Tylenol 650mg  PO TID for sacral pain/discomfort, q2h turns, and air mattress   Ongoing GOC discussions pending clinical course  PMT will continue to follow and support holistically  Goals of Care and Additional Recommendations:  Limitations on Scope of Treatment: Full Scope Treatment and No Artificial Feeding  Code  Status:    Code Status Orders  (From admission, onward)         Start     Ordered   09/25/20 1446  Do not attempt resuscitation (DNR)  Continuous       Question Answer Comment  In the event of cardiac or respiratory ARREST Do not call a "code blue"   In the event of cardiac or respiratory ARREST Do not perform Intubation, CPR, defibrillation or ACLS   In the event of cardiac or respiratory ARREST Use medication by any route, position, wound care, and other measures to relive pain and suffering. May use oxygen, suction and manual treatment of airway obstruction as needed for comfort.      09/25/20 1445        Code Status History    Date Active Date Inactive Code Status Order ID Comments User Context   09/25/2020 1258 09/25/2020 1445 Partial Code [id number]  Vickii Penna, RN Inpatient   09/24/2020 0155 09/25/2020 1258 Full Code [id number]  Elwyn Reach, MD Inpatient   04/16/2018 1024 04/28/2018 1821 Partial Code [id number]  Roney Jaffe, MD Inpatient   04/15/2018 1620 04/15/2018 1621 DNR [id number]  Cathlyn Parsons, PA-C Inpatient   04/15/2018 1620 04/15/2018 1620 Full Code [id number]  Cathlyn Parsons, PA-C Inpatient   04/13/2018 1711 04/15/2018 1542 DNR [id number]  Roney Jaffe, MD Inpatient  04/02/2018 2040 04/13/2018 1711 Full Code [id number]  Germain Osgood, PA-C ED   12/21/2015 1721 12/25/2015 1606 Partial Code [id number]  Samella Parr, NP Inpatient   12/04/2015 1653 12/15/2015 1451 Full Code [id number]  Flora Lipps Inpatient   07/13/2015 1955 07/17/2015 1637 Partial Code [id number]  Ivor Costa, MD ED   04/21/2015 1658 04/22/2015 1624 Full Code [id number]  Gabriel Earing, PA-C Inpatient   07/29/2011 1802 08/03/2011 1434 Full Code [id number]  Raquel James, RN Inpatient   Advance Care Planning Activity       Prognosis:   < 6 months  Discharge Planning:  To Be Determined  Care plan was discussed with primary RN, patient  Thank you for allowing the Palliative  Medicine Team to assist in the care of this patient.   Total Time 25 minutes Prolonged Time Billed  no       Greater than 50%  of this time was spent counseling and coordinating care related to the above assessment and plan.  Lin Landsman, NP  Please contact Palliative Medicine Team phone at [phone] for questions and concerns.

## 2020-10-07 LAB — CBC
HCT: 36.8 % (ref 36.0–46.0)
Hemoglobin: 11.6 g/dL — ABNORMAL LOW (ref 12.0–15.0)
MCH: 32.2 pg (ref 26.0–34.0)
MCHC: 31.5 g/dL (ref 30.0–36.0)
MCV: 102.2 fL — ABNORMAL HIGH (ref 80.0–100.0)
Platelets: 253 10*3/uL (ref 150–400)
RBC: 3.6 MIL/uL — ABNORMAL LOW (ref 3.87–5.11)
RDW: 15.8 % — ABNORMAL HIGH (ref 11.5–15.5)
WBC: 17.4 10*3/uL — ABNORMAL HIGH (ref 4.0–10.5)
nRBC: 0 % (ref 0.0–0.2)

## 2020-10-07 LAB — RENAL FUNCTION PANEL
Albumin: 2 g/dL — ABNORMAL LOW (ref 3.5–5.0)
Anion gap: 10 (ref 5–15)
BUN: 34 mg/dL — ABNORMAL HIGH (ref 8–23)
CO2: 30 mmol/L (ref 22–32)
Calcium: 9 mg/dL (ref 8.9–10.3)
Chloride: 93 mmol/L — ABNORMAL LOW (ref 98–111)
Creatinine, Ser: 4.43 mg/dL — ABNORMAL HIGH (ref 0.44–1.00)
GFR, Estimated: 10 mL/min — ABNORMAL LOW (ref >60)
Glucose, Bld: 160 mg/dL — ABNORMAL HIGH (ref 70–99)
Phosphorus: 2.8 mg/dL (ref 2.5–4.6)
Potassium: 3.4 mmol/L — ABNORMAL LOW (ref 3.5–5.1)
Sodium: 133 mmol/L — ABNORMAL LOW (ref 135–145)

## 2020-10-07 MED ORDER — POTASSIUM CHLORIDE 20 MEQ PO PACK
40.0000 meq | PACK | Freq: Once | ORAL | Status: AC
  Administered 2020-10-07: 40 meq via ORAL
  Filled 2020-10-07 (×2): qty 2

## 2020-10-07 NOTE — Progress Notes (Signed)
Patient in bed alert and pullling on tele and pulled covers off. She had a B.M. in the bed and was also having runs of V-Tach and Questionable sinus arrest . See stripe. But she was alert the whole time and skin warm and dry resp. Even and unlab.

## 2020-10-07 NOTE — Progress Notes (Addendum)
Brief Palliative Medicine Progress Note:  Received page from primary RN.   RN reports daughter [name] is upset because "a doctor" told her patient was transitioning to comfort measures and stopping dialysis. Discussed, as far as I am aware, this is not the plan. Provided RN with updates from [place] conversations PMT has previously had with patient and daughter/[name] - discussed that likely, patient will be too unstable to continue dialysis in the near future; however, as of now patient is not comfort care.   Chart review performed.   Reviewed nephrology notes with RN - per note today, plan is to try HD in chair next week. Per nephrology recommendations, discussed attempting to get patient to chair before her next HD session to assess for tolerance. RN provided updates that patient has a hard time sitting up without assistance. I will place PT order to evaluate and assist -verified with RN that patient no longer has femoral line requiring bedrest.  RN states she will call daughter/[name] back per her request and provide updates. PMT will reach out to daughter tomorrow.  Recommendations/Plan:  PT consult placed to assist with evaluation of patient's tolerance to transfer/sit in chair in preparation of chair dialysis next week  PMT will follow up with daughter/[name] tomorrow 3/26 regarding concerns as outlined by RN above   Thank you for allowing PMT to assist in the care of this patient.  Amber M. Tamala Julian, FNP-BC Palliative Medicine Team Team Phone: [phone] Total Time: 15 minutes  Greater than 50%  of this time was spent counseling and coordinating care related to the above assessment and plan.

## 2020-10-07 NOTE — Progress Notes (Signed)
[facility] Progress Note  Subjective:  Seen on HD. More alert today.     Vitals:   10/07/20 1030 10/07/20 1100 10/07/20 1130 10/07/20 1158  BP: (!) 76/38 (!) 68/27 (!) 74/61 (!) 80/30  Pulse: 68 66 66 97  Resp: (!) 22 (!) 21 (!) 23 (!) 21  Temp:    98 F (36.7 C)  TempSrc:    Oral  SpO2: 97% 98% 92% 94%  Height:        Exam General: Chronically ill appearing female, nad Heart: RRR, no murmur Lungs: CTA anteriorly without wheezing or rales Abdomen: Soft, non-distended, +BS. Extremities: no LE or UE edema Dialysis Access: LUE AVF + bruit; maturing RUA AVF+bruit     OP HD: NW TTS 4h  76.5kg  3K/Ca 2.5 bath  Hep none  LUE AVF - 100 mircera q 2 wks, last 3/08     Assessment/ Plan: 1. VT/ cardiac arrest/ CAD hx of CABG: cath 09/25/31 with severe native disease and new 100% ostial + prox CTO of SVG RCA treated w/ stenting. Seen by EP, not an ICD candidate. Treating medically , on po amiodarone.  2. ESRD - on HD TTS. Next week will try HD in a chair. Getting pt up to chair in her room for several hours would serve as a reasonable proxy.  3. HD access: due to steal symptoms in L hand, VVS placed new RUA BC AVF on 07/03/20. Idea is to ligate L arm AVF when the R AVF is operational.  4. Anemia ckd: Hgb 11.4 on 3/22. No esa given here. Hgb stable.  5. CKD-MBD: Checking RFP, continue binders and vits 6. Nutrition: protein supps 7. Hypotension/volume: BP's 80's- 100's. Midodrine 10 pre HD tiw started. At her dry wt.  8. DM2- on insulin, per pmd 9. DNR 10. Debility/ deconditioning/ FTT - pt will have to be able to dialyze sitting in a chair for 4-6 hrs here to be candidate for discharge to home or SNF. If not able, the only other discharge option would be LTAC.  11. EOL -  pall care working w/ pt and family  [name] 10/07/2020, 1:12 PM   Recent Labs  Lab 10/03/20 0136 10/04/20 0114 10/05/20 0052 10/06/20 0217 10/06/20 1008 10/07/20 0037  K 4.3   < > 3.8   --  3.4*  --   BUN 44*   < > 38*  --  21  --   CREATININE 5.38*   < > 4.74*  --  3.47*  --   CALCIUM 9.1   < > 9.1  --  9.0  --   PHOS 3.3  --   --   --   --   --   HGB 11.4*  --  12.2 12.3  --  11.6*   < > = values in this interval not displayed.   Inpatient medications: . acetaminophen  650 mg Oral TID  . allopurinol  100 mg Oral q AM  . amiodarone  200 mg Oral BID  . brimonidine  1 drop Right Eye QHS   And  . brimonidine  2 drop Left Eye QHS  . Chlorhexidine Gluconate Cloth  6 each Topical Q0600  . cinacalcet  30 mg Oral Once per day on Tue Thu  . clopidogrel  75 mg Oral q AM  . feeding supplement (NEPRO CARB STEADY)  237 mL Oral TID BM  . hydrocortisone   Rectal TID  . hydrocortisone  25 mg Rectal BID  .  latanoprost  1 drop Both Eyes QHS  . midodrine  10 mg Oral TID WC  . multivitamin  1 tablet Oral QHS  . polycarbophil  625 mg Oral BID WC  . potassium chloride  40 mEq Oral Once  . sevelamer carbonate  1,600 mg Oral BID WC  . sodium chloride flush  3 mL Intravenous Q12H  . timolol  1 drop Left Eye QHS   . sodium chloride    . sodium chloride    . sodium chloride     sodium chloride, sodium chloride, acetaminophen, alteplase, famotidine, heparin, lidocaine (PF), lidocaine-prilocaine, nitroGLYCERIN, oxyCODONE-acetaminophen, pentafluoroprop-tetrafluoroeth, polyvinyl alcohol, prochlorperazine, witch hazel-glycerin

## 2020-10-07 NOTE — Progress Notes (Addendum)
PROGRESS NOTE    [name]  KPT:[id number] DOB: Dec 23, 1940 DOA: 09/23/2020 PCP: Josetta Huddle, MD    Brief Narrative:  This 80 y.o.femalewith medical history significant ofend-stage renal disease on hemodialysis TTS, coronary artery disease, carotid stenosis, atrial fibrillation, previous history of nonsustained V. tach, diabetes, essential hypertension, hyperlipidemia, hard of hearing who presented with multiple episodes of syncope prior to admission, later found to have vtach by EMS. Pt was admitted for further work up.  Patient was scheduled to undergo left heart cath on 3/14 when she sustained an episode of symptomatic V. Tach (code blue), requiring brief chest compressions with ROSC. Later she underwent emergent cath on 3/13 with new 100% ostial and proximal CTO of SVG RCA. EP was consulted, not a candidate for ICD.  CT angio showed no PE.  Patient continued to have several episodes of nonsustained V. tach or ventricular bigeminy, hypoxia. Cardiology recommended amiodarone, no beta-blocker due to bradycardia. Palliative medicine following, goals of care discussed, DNR/DNI, for now,  continue full scope medical treatment. Nephrology following for hemodialysis.  Assessment & Plan:   Principal Problem:   Ventricular tachycardia ([place]) Active Problems:   Essential hypertension   Hyperlipidemia associated with type 2 diabetes mellitus (HCC)   Paroxysmal atrial fibrillation (HCC)   Coronary artery disease involving native heart without angina pectoris   Hypokalemia   QT prolongation   ESRD on dialysis (HCC)   Anemia of chronic disease   Diabetes mellitus type 2 in nonobese [facility])   Syncope and collapse   Coronary artery disease involving autologous vein bypass graft   Demand ischemia of myocardium (HCC)   Pressure injury of skin   Recurrent symptomatic ventricular tachycardia with multiple syncopal episodes ([place]), hypotension -Cardiac cath on 3/13 with findings of new 100%  ostial and proximal CTO of SVG RCA. -Seen by EP, not a candidate for ICD.  No further invasive intervention from cardiac standpoint. -Continue aspirin, Plavix, statin -Cardiology following, now transitioned to oral amiodarone. -Palliative medicine was consulted, currently goals DNR/DNI, continue full scope medical treatment.   -Per nephrology, will Continue dialysis if patient is able to tolerate, will consider transitioning to comfort care if she cannot tolerate. 10/01/2020: Cardiology input is highly appreciated.  Paroxysmal atrial fibrillation-postop -Received amiodarone bolus with drip, now transitioned to oral amiodarone. -Cardiology following, not a candidate for anticoagulation due to previous brain bleed.  Diabetes mellitus type 2, IDDM, with nephropathy -Hemoglobin A1c 5.0 -Continue sliding scale insulin  Hypotension : Currently on midodrine.  Improving.  ESRD on hemodialysis, TTS -Nephrology following, plan to continue HD and assess if she is able to tolerate.   If patient is not able to tolerate HD, will consider pursuing comfort care.  Palliative medicine is following 10/01/2020: Patient underwent hemodialysis. Next HD 3/24. She is tolerating hemodialysis. Continue as per schedule. Patient should be able to dialyze sitting in the chair for 4 to 6 hours for to be considered to be discharged home or nursing home, other option would be LTAC.  Anemia of chronic disease : -Continue ESA, H&H currently stable.  Hemoglobin is currently at goal.  Transaminitis : -Possibly due to #1, hypotension, no acute complaints/abdominal pain -LFTs back to normal.  Generalized debility, very deconditioned, FTT -Slightly alert and oriented today. , increase mobility, PT OT. -Patient's appetite has unfortunately not improved with Marinol. -Family understand that she is approaching end-of-life.  Obesity Estimated body mass index is 28.61 kg/m as calculated from the following:    Height as of this  encounter: 5\' 4"  (1.626 m).   Weight as of this encounter: 75.6 kg.   DVT prophylaxis: heparin sq Code Status: DNR Family Communication: Spoke with daughter on phone and updated. Disposition Plan:  Status is: Inpatient  Remains inpatient appropriate because:Inpatient level of care appropriate due to severity of illness   Dispo: The patient is from: Home              Anticipated d/c is to: TBD              Patient currently is not medically stable to d/c.   Difficult to place patient No   Consultants:    Nephrology  Procedures: [facility] 3/13  Antimicrobials:   Anti-infectives (From admission, onward)   None      Subjective: Patient was seen and examined in HD suite.  Overnight events noted.   She is alert and much oriented, She gets into  Full conversation. She is following commands, she reports feeling tired.   Objective: Vitals:   10/07/20 1100 10/07/20 1130 10/07/20 1158 10/07/20 1313  BP: (!) 68/27 (!) 74/61 (!) 80/30 (!) 80/33  Pulse: 66 66 97 63  Resp: (!) 21 (!) 23 (!) 21 (!) 21  Temp:   98 F (36.7 C) 98.1 F (36.7 C)  TempSrc:   Oral Oral  SpO2: 98% 92% 94% 98%  Height:        Intake/Output Summary (Last 24 hours) at 10/07/2020 1335 Last data filed at 10/07/2020 1158 Gross per 24 hour  Intake 340 ml  Output -500 ml  Net 840 ml   Filed Weights    Examination:  General exam: Appears calm and comfortable, not in any acute distress. Respiratory system: Clear to auscultation. Respiratory effort normal. Cardiovascular system: S1 & S2 heard, RRR. No JVD, murmurs, rubs, gallops or clicks. No pedal edema. Gastrointestinal system: Abdomen is nondistended, soft and nontender. No organomegaly or masses felt.  Normal bowel sounds heard. Central nervous system: Alert and oriented x1 . No focal neurological deficits. Extremities: Symmetric 5 x 5 power. Skin: No rashes, lesions or ulcers Psychiatry: Judgement and insight appear normal. Mood &  affect appropriate.     Data Reviewed: I have personally reviewed following labs and imaging studies  CBC: Recent Labs  Lab 10/01/20 0327 10/03/20 0136 10/05/20 0052 10/06/20 0217 10/07/20 0037  WBC 10.1 10.4 17.2* 22.3* 17.4*  HGB 10.0* 11.4* 12.2 12.3 11.6*  HCT 31.8* 34.1* 37.2 37.3 36.8  MCV 105.6* 99.1 99.5 99.7 102.2*  PLT 150 209 233 252 093   Basic Metabolic Panel: Recent Labs  Lab 09/30/20 1421 10/01/20 0327 10/03/20 0136 10/04/20 0114 10/05/20 0052 10/06/20 1008  NA  --  130* 131* 130* 130* 134*  K  --  4.0 4.3 4.3 3.8 3.4*  CL  --  92* 95* 93* 93* 94*  CO2  --  26 24 24 24 29   GLUCOSE  --  169* 174* 225* 185* 173*  BUN  --  44* 44* 25* 38* 21  CREATININE  --  5.90* 5.38* 3.60* 4.74* 3.47*  CALCIUM  --  8.7* 9.1 9.0 9.1 9.0  MG 2.1  --  2.1  --   --   --   PHOS  --   --  3.3  --   --   --    GFR: Estimated Creatinine Clearance: 13 mL/min (A) (by C-G formula based on SCr of 3.47 mg/dL (H)). Liver Function Tests: Recent Labs  Lab 10/03/20 0136  AST  20  ALT 29  ALKPHOS 81  BILITOT 0.5  PROT 5.9*  ALBUMIN 2.3*   No results for input(s): LIPASE, AMYLASE in the last 168 hours. No results for input(s): AMMONIA in the last 168 hours. Coagulation Profile: No results for input(s): INR, PROTIME in the last 168 hours. Cardiac Enzymes: No results for input(s): CKTOTAL, CKMB, CKMBINDEX, TROPONINI in the last 168 hours. BNP (last 3 results) No results for input(s): PROBNP in the last 8760 hours. HbA1C: No results for input(s): HGBA1C in the last 72 hours. CBG: Recent Labs  Lab 10/05/20 1206 10/05/20 1913 10/05/20 2134 10/06/20 0601 10/06/20 1136  GLUCAP 204* 185* 181* 153* 168*   Lipid Profile: No results for input(s): CHOL, HDL, LDLCALC, TRIG, CHOLHDL, LDLDIRECT in the last 72 hours. Thyroid Function Tests: No results for input(s): TSH, T4TOTAL, FREET4, T3FREE, THYROIDAB in the last 72 hours. Anemia Panel: No results for input(s): VITAMINB12,  FOLATE, FERRITIN, TIBC, IRON, RETICCTPCT in the last 72 hours. Sepsis Labs: No results for input(s): PROCALCITON, LATICACIDVEN in the last 168 hours.  No results found for this or any previous visit (from the past 240 hour(s)).  Radiology Studies: DG CHEST PORT 1 VIEW  Result Date: 10/06/2020 CLINICAL DATA:  Shortness of breath EXAM: PORTABLE CHEST 1 VIEW COMPARISON:  September 23, 2020 chest radiograph; September 24, 2020 FINDINGS: There is a pleural effusion on the right with ill-defined underlying opacity, consistent with atelectasis and/or degree of pneumonia. There is consolidation in the medial left base. Left lung is otherwise clear. Heart is upper normal in size with pulmonary vascularity normal. Patient is status post coronary artery bypass grafting. There is aortic atherosclerosis. There is a stent in the left brachial region. No bone lesions. No adenopathy evident. IMPRESSION: Pleural effusion on the right. Suspect underlying atelectasis and potential patchy infiltrate. Consolidation medial left base raises concern for atelectasis with superimposed pneumonia. Stable cardiac prominence. Status post coronary artery bypass grafting. Aortic Atherosclerosis (ICD10-I70.0). Electronically Signed   By: Lowella Grip III M.D.   On: 10/06/2020 09:02    Scheduled Meds: . acetaminophen  650 mg Oral TID  . allopurinol  100 mg Oral q AM  . amiodarone  200 mg Oral BID  . brimonidine  1 drop Right Eye QHS   And  . brimonidine  2 drop Left Eye QHS  . Chlorhexidine Gluconate Cloth  6 each Topical Q0600  . cinacalcet  30 mg Oral Once per day on Tue Thu  . clopidogrel  75 mg Oral q AM  . feeding supplement (NEPRO CARB STEADY)  237 mL Oral TID BM  . hydrocortisone   Rectal TID  . hydrocortisone  25 mg Rectal BID  . latanoprost  1 drop Both Eyes QHS  . midodrine  10 mg Oral TID WC  . multivitamin  1 tablet Oral QHS  . polycarbophil  625 mg Oral BID WC  . sevelamer carbonate  1,600 mg Oral BID WC  .  sodium chloride flush  3 mL Intravenous Q12H  . timolol  1 drop Left Eye QHS   Continuous Infusions: . sodium chloride    . sodium chloride    . sodium chloride       LOS: 14 days    Time spent: 25 mins    Shawna Clamp, MD [facility]   If 7PM-7AM, please contact night-coverage

## 2020-10-07 NOTE — Progress Notes (Signed)
Patient has had several short runs of V-Tach tonight. She has been asleep and awake with it. She voices no complaints. I have been in room when it happens and she was asymptomatic with it. And goes back into to S.B. 40's -50's

## 2020-10-08 LAB — CBC
HCT: 34.3 % — ABNORMAL LOW (ref 36.0–46.0)
Hemoglobin: 10.6 g/dL — ABNORMAL LOW (ref 12.0–15.0)
MCH: 32.2 pg (ref 26.0–34.0)
MCHC: 30.9 g/dL (ref 30.0–36.0)
MCV: 104.3 fL — ABNORMAL HIGH (ref 80.0–100.0)
Platelets: 239 10*3/uL (ref 150–400)
RBC: 3.29 MIL/uL — ABNORMAL LOW (ref 3.87–5.11)
RDW: 15.9 % — ABNORMAL HIGH (ref 11.5–15.5)
WBC: 11.4 10*3/uL — ABNORMAL HIGH (ref 4.0–10.5)
nRBC: 0 % (ref 0.0–0.2)

## 2020-10-08 LAB — BASIC METABOLIC PANEL
Anion gap: 9 (ref 5–15)
BUN: 20 mg/dL (ref 8–23)
CO2: 28 mmol/L (ref 22–32)
Calcium: 8.9 mg/dL (ref 8.9–10.3)
Chloride: 100 mmol/L (ref 98–111)
Creatinine, Ser: 3.16 mg/dL — ABNORMAL HIGH (ref 0.44–1.00)
GFR, Estimated: 14 mL/min — ABNORMAL LOW (ref >60)
Glucose, Bld: 124 mg/dL — ABNORMAL HIGH (ref 70–99)
Potassium: 3.9 mmol/L (ref 3.5–5.1)
Sodium: 137 mmol/L (ref 135–145)

## 2020-10-08 MED ORDER — OXYCODONE-ACETAMINOPHEN 5-325 MG PO TABS
1.0000 | ORAL_TABLET | ORAL | Status: DC | PRN
  Administered 2020-10-08: 1 via ORAL
  Administered 2020-10-09 – 2020-10-10 (×2): 2 via ORAL
  Filled 2020-10-08: qty 1
  Filled 2020-10-08 (×3): qty 2

## 2020-10-08 NOTE — Progress Notes (Signed)
Pt was able to with use of gait belt and PT get her into a chair with feet elevated and bottom padded with a pillow from 11:34-2:00 p.m. In b/w pt was given pain medication for painful pressure on hemorrhoids right after being seated in the chair. Pt will need pain control prior to getting into the chair and sitting up with a goal of 4-6 hours to see if can tolerate with dialysis. Will continue to monitor.

## 2020-10-08 NOTE — Progress Notes (Signed)
PROGRESS NOTE    [name]  NLG:[id number] DOB: 05/22/1941 DOA: 09/23/2020 PCP: Josetta Huddle, MD    Brief Narrative:  This 80 y.o.femalewith medical history significant ofend-stage renal disease on hemodialysis TTS, coronary artery disease, carotid stenosis, atrial fibrillation, previous history of nonsustained V. tach, diabetes, essential hypertension, hyperlipidemia, hard of hearing who presented with multiple episodes of syncope prior to admission, later found to have vtach by EMS. Pt was admitted for further work up.  Patient was scheduled to undergo left heart cath on 3/14 when she sustained an episode of symptomatic V. Tach (code blue), requiring brief chest compressions with ROSC. Later she underwent emergent cath on 3/13 with new 100% ostial and proximal CTO of SVG RCA. EP was consulted, not a candidate for ICD.  CT angio showed no PE.  Patient continued to have several episodes of nonsustained V. tach or ventricular bigeminy, hypoxia. Cardiology recommended amiodarone, no beta-blocker due to bradycardia. Palliative medicine following, goals of care discussed, DNR/DNI, for now,  continue full scope medical treatment. Nephrology following for hemodialysis.  Assessment & Plan:   Principal Problem:   Ventricular tachycardia ([name]) Active Problems:   Essential hypertension   Hyperlipidemia associated with type 2 diabetes mellitus (HCC)   Paroxysmal atrial fibrillation (HCC)   Coronary artery disease involving native heart without angina pectoris   Hypokalemia   QT prolongation   ESRD on dialysis (HCC)   Anemia of chronic disease   Diabetes mellitus type 2 in nonobese [name])   Syncope and collapse   Coronary artery disease involving autologous vein bypass graft   Demand ischemia of myocardium (HCC)   Pressure injury of skin   Recurrent symptomatic ventricular tachycardia with multiple syncopal episodes ([name]), hypotension -Cardiac cath on 3/13 with findings of new 100%  ostial and proximal CTO of SVG RCA. -Seen by EP, not a candidate for ICD.  No further invasive intervention from cardiac standpoint. -Continue aspirin, Plavix, statin -Cardiology following, now transitioned to oral amiodarone. -Palliative medicine was consulted, currently goals DNR/DNI, continue full scope medical treatment.   -Per nephrology, will Continue dialysis if patient is able to tolerate, will consider transitioning to comfort care if she cannot tolerate. 10/01/2020: Cardiology input is highly appreciated.  Paroxysmal atrial fibrillation-postop -Received amiodarone bolus with drip, now transitioned to oral amiodarone. -Cardiology following, not a candidate for anticoagulation due to previous brain bleed.  Diabetes mellitus type 2, IDDM, with nephropathy -Hemoglobin A1c 5.0 -Continue sliding scale insulin  Hypotension : Currently on midodrine.  Improving.  ESRD on hemodialysis, TTS -Nephrology following, plan to continue HD and assess if she is able to tolerate.   If patient is not able to tolerate HD, will consider pursuing comfort care.  Palliative medicine is following 10/01/2020: Patient underwent hemodialysis. Next HD 3/24. She is tolerating hemodialysis. Continue as per schedule. Patient should be able to dialyze sitting in the chair for 4 to 6 hours for to be considered to be discharged home or nursing home, other option would be LTAC. Plan: Hemodialysis on Monday instead of Tuesday if she can sit in the chair for dialysis,  she can be discharged.  Anemia of chronic disease : -Continue ESA, H&H currently stable.  Hemoglobin is currently at goal.  Transaminitis : -Possibly due to #1, hypotension, no acute complaints/abdominal pain -LFTs back to normal.  Generalized debility, very deconditioned, FTT -Slightly alert and oriented today. , increase mobility, PT OT. -Patient's appetite has unfortunately not improved with Marinol. -Family understand that she is  approaching  end-of-life.  Obesity Estimated body mass index is 28.61 kg/m as calculated from the following:   Height as of this encounter: 5\' 4"  (1.626 m).   Weight as of this encounter: 75.6 kg.   DVT prophylaxis: heparin sq Code Status: DNR Family Communication: Spoke with daughter on phone and updated. Disposition Plan:  Status is: Inpatient  Remains inpatient appropriate because:Inpatient level of care appropriate due to severity of illness   Dispo: The patient is from: Home              Anticipated d/c is to: TBD              Patient currently is not medically stable to d/c.   Difficult to place patient No   Consultants:    Nephrology  Procedures: [name] 3/13  Antimicrobials:   Anti-infectives (From admission, onward)   None      Subjective: Patient was seen and examined in HD suite.  Overnight events noted.    She is alert and much oriented, She gets into full conversation. She is following commands, she reports feeling tired. She is having HD tomorrow.   Objective: Vitals:   10/08/20 0122 10/08/20 0507 10/08/20 0848 10/08/20 1122  BP: (!) 70/20 111/90 (!) 94/44 129/68  Pulse: (!) 54 66 60 71  Resp: 19 20 16 20   Temp: 98.3 F (36.8 C) 98.3 F (36.8 C) 97.9 F (36.6 C) 97.6 F (36.4 C)  TempSrc: Oral Axillary Oral Oral  SpO2: 96% 99% 97% 98%  Height:        Intake/Output Summary (Last 24 hours) at 10/08/2020 1337 Last data filed at 10/08/2020 0830 Gross per 24 hour  Intake 240 ml  Output -  Net 240 ml   Filed Weights    Examination:  General exam: Appears calm and comfortable, not in any acute distress. Respiratory system: Clear to auscultation. Respiratory effort normal. Cardiovascular system: S1 & S2 heard, RRR. No JVD, murmurs, rubs, gallops or clicks. No pedal edema. Gastrointestinal system: Abdomen is nondistended, soft and nontender. No organomegaly or masses felt.  Normal bowel sounds heard. Central nervous system: Alert and oriented  x1 . No focal neurological deficits. Extremities: Symmetric 5 x 5 power. Skin: No rashes, lesions or ulcers Psychiatry: Judgement and insight appear normal. Mood & affect appropriate.     Data Reviewed: I have personally reviewed following labs and imaging studies  CBC: Recent Labs  Lab 10/03/20 0136 10/05/20 0052 10/06/20 0217 10/07/20 0037 10/08/20 0513  WBC 10.4 17.2* 22.3* 17.4* 11.4*  HGB 11.4* 12.2 12.3 11.6* 10.6*  HCT 34.1* 37.2 37.3 36.8 34.3*  MCV 99.1 99.5 99.7 102.2* 104.3*  PLT 209 233 252 253 935   Basic Metabolic Panel: Recent Labs  Lab 10/03/20 0136 10/04/20 0114 10/05/20 0052 10/06/20 1008 10/07/20 0830 10/08/20 0512  NA 131* 130* 130* 134* 133* 137  K 4.3 4.3 3.8 3.4* 3.4* 3.9  CL 95* 93* 93* 94* 93* 100  CO2 24 24 24 29 30 28   GLUCOSE 174* 225* 185* 173* 160* 124*  BUN 44* 25* 38* 21 34* 20  CREATININE 5.38* 3.60* 4.74* 3.47* 4.43* 3.16*  CALCIUM 9.1 9.0 9.1 9.0 9.0 8.9  MG 2.1  --   --   --   --   --   PHOS 3.3  --   --   --  2.8  --    GFR: Estimated Creatinine Clearance: 14.2 mL/min (A) (by C-G formula based on SCr of 3.16 mg/dL (H)). Liver  Function Tests: Recent Labs  Lab 10/03/20 0136 10/07/20 0830  AST 20  --   ALT 29  --   ALKPHOS 81  --   BILITOT 0.5  --   PROT 5.9*  --   ALBUMIN 2.3* 2.0*   No results for input(s): LIPASE, AMYLASE in the last 168 hours. No results for input(s): AMMONIA in the last 168 hours. Coagulation Profile: No results for input(s): INR, PROTIME in the last 168 hours. Cardiac Enzymes: No results for input(s): CKTOTAL, CKMB, CKMBINDEX, TROPONINI in the last 168 hours. BNP (last 3 results) No results for input(s): PROBNP in the last 8760 hours. HbA1C: No results for input(s): HGBA1C in the last 72 hours. CBG: Recent Labs  Lab 10/05/20 1206 10/05/20 1913 10/05/20 2134 10/06/20 0601 10/06/20 1136  GLUCAP 204* 185* 181* 153* 168*   Lipid Profile: No results for input(s): CHOL, HDL, LDLCALC, TRIG,  CHOLHDL, LDLDIRECT in the last 72 hours. Thyroid Function Tests: No results for input(s): TSH, T4TOTAL, FREET4, T3FREE, THYROIDAB in the last 72 hours. Anemia Panel: No results for input(s): VITAMINB12, FOLATE, FERRITIN, TIBC, IRON, RETICCTPCT in the last 72 hours. Sepsis Labs: No results for input(s): PROCALCITON, LATICACIDVEN in the last 168 hours.  No results found for this or any previous visit (from the past 240 hour(s)).  Radiology Studies: No results found.  Scheduled Meds: . acetaminophen  650 mg Oral TID  . allopurinol  100 mg Oral q AM  . amiodarone  200 mg Oral BID  . brimonidine  1 drop Right Eye QHS   And  . brimonidine  2 drop Left Eye QHS  . Chlorhexidine Gluconate Cloth  6 each Topical Q0600  . cinacalcet  30 mg Oral Once per day on Tue Thu  . clopidogrel  75 mg Oral q AM  . feeding supplement (NEPRO CARB STEADY)  237 mL Oral TID BM  . hydrocortisone   Rectal TID  . hydrocortisone  25 mg Rectal BID  . latanoprost  1 drop Both Eyes QHS  . midodrine  10 mg Oral TID WC  . multivitamin  1 tablet Oral QHS  . polycarbophil  625 mg Oral BID WC  . sevelamer carbonate  1,600 mg Oral BID WC  . sodium chloride flush  3 mL Intravenous Q12H  . timolol  1 drop Left Eye QHS   Continuous Infusions: . sodium chloride    . sodium chloride    . sodium chloride       Debra: 15 days    Time spent: 25 mins    Shawna Clamp, MD [facility]   If 7PM-7AM, please contact night-coverage

## 2020-10-08 NOTE — Progress Notes (Addendum)
Daily Progress Note   Patient Name: [name]       Date: 10/08/2020 DOB: 10/16/1940  Age: 80 y.o. MRN#: [id number] Attending Physician: Shawna Clamp, MD Primary Care Physician: Josetta Huddle, MD Admit Date: 09/23/2020  Reason for Consultation/Follow-up: Establishing goals of care  Subjective: Chart review performed. Received report from primary RN - no acute concerns. Patient has eaten pancakes today and worked with PT. RN reports patient was able to sit in chair from 11:30-2pm before having to transition back to bed.  Went to visit patient at bedside - no family/visitors present. Patient is lying in bed, awake, alert, oriented, and able to participate in conversation. We discussed how working with PT went for her this morning. [name] reports it was very painful for her - she states her hemorrhoid pain is 10/10 when sitting. I discussed trying to premedicate for pain before getting up to chair next time and informed patient I would also discuss this with RN.   I discussed plan for dialysis tomorrow in chair for preparation/assessment of tolerance for discharge. We discussed she would have to tolerate a 4-6 hour treatment sitting. Patient is worried she will not be able to tolerate sitting for that long, but is willing to try. We discussed PT's recommendation for SNF rehab on discharge - patient is agreeable to try rehab on discharge if she is able to tolerate HD treatment in chair.   Therapeutic listening provided as patient expressed happiness getting to see her husband yesterday for 10 minutes.  After visit with patient, called daughter/[name] to address concerns PMT was notified of yesterday and provide updates on current plan as outlined above. Therapeutic listening provided as [name]  expressed frustration over "a doctor" she spoke with that stated patient "is not going to get any better, stop HD in 2 weeks." Validated that this information is hard to hear. Gently reviewed that patient is likely approaching EOL as previously discussed and will not improve much from her current state now; however, reiterated hearing their goals of: if patient is eating, they want to continue dialysis and if patient stops eating they will transition to full comfort at that time. [name] expressed understanding and verifies this is still their goal.  I updated [name] on current plan for HD tomorrow as outlined above, how PT/sitting in chair  went this afternoon, and patient's concern she will not be able to tolerate entire treatment in chair. I reviewed with [name], at this time, pain is the patient's biggest barrier to being able to tolerate sitting in chair. I reviewed with her pain management plan we will try. [name] also agrees for patient to discharge to SNF rehab if patient is able to tolerate chair dialysis. [name] expressed appreciation and updates from PMT today.  All questions and concerns addressed. Encouraged to call with questions and/or concerns. PMT card previously provided.  Spoke with primary RN about obtaining donut cushion and recommendation to send with patient to treatment tomorrow. Discussed premedicating for pain prior to transfers/sitting in chair for better pain management.   Length of Stay: 15  Current Medications: Scheduled Meds:  . acetaminophen  650 mg Oral TID  . allopurinol  100 mg Oral q AM  . amiodarone  200 mg Oral BID  . brimonidine  1 drop Right Eye QHS   And  . brimonidine  2 drop Left Eye QHS  . Chlorhexidine Gluconate Cloth  6 each Topical Q0600  . cinacalcet  30 mg Oral Once per day on Tue Thu  . clopidogrel  75 mg Oral q AM  . feeding supplement (NEPRO CARB STEADY)  237 mL Oral TID BM  . hydrocortisone   Rectal TID  . hydrocortisone  25 mg Rectal BID  .  latanoprost  1 drop Both Eyes QHS  . midodrine  10 mg Oral TID WC  . multivitamin  1 tablet Oral QHS  . polycarbophil  625 mg Oral BID WC  . sevelamer carbonate  1,600 mg Oral BID WC  . sodium chloride flush  3 mL Intravenous Q12H  . timolol  1 drop Left Eye QHS    Continuous Infusions: . sodium chloride    . sodium chloride    . sodium chloride      PRN Meds: sodium chloride, sodium chloride, acetaminophen, alteplase, famotidine, heparin, lidocaine (PF), lidocaine-prilocaine, nitroGLYCERIN, oxyCODONE-acetaminophen, pentafluoroprop-tetrafluoroeth, polyvinyl alcohol, prochlorperazine, witch hazel-glycerin  Physical Exam Vitals and nursing note reviewed.  Constitutional:      General: She is not in acute distress.    Appearance: She is ill-appearing.  Pulmonary:     Effort: No respiratory distress.  Skin:    General: Skin is warm and dry.  Neurological:     Mental Status: She is alert and oriented to person, place, and time.     Motor: Weakness present.  Psychiatric:        Attention and Perception: Attention normal.        Behavior: Behavior is cooperative.        Cognition and Memory: Cognition and memory normal.             Vital Signs: BP 129/68 (BP Location: Left Leg)   Pulse 71   Temp 97.6 F (36.4 C) (Oral)   Resp 20   Ht 5\' 4"  (1.626 m)   Wt 76.6 kg   SpO2 98%   BMI 28.99 kg/m  SpO2: SpO2: 98 % O2 Device: O2 Device: Room Air O2 Flow Rate: O2 Flow Rate (L/min): 2 L/min  Intake/output summary:   Intake/Output Summary (Last 24 hours) at 10/08/2020 1427 Last data filed at 10/08/2020 0830 Gross per 24 hour  Intake 240 ml  Output -  Net 240 ml   LBM: Last BM Date: 10/07/20 Baseline Weight: Weight: 77.1 kg Most recent weight: Weight:  (Unable to check bed wt)  Palliative Assessment/Data: PPS 30%    Flowsheet Rows   Flowsheet Row Most Recent Value  Intake Tab   Referral Department Hospitalist  Unit at Time of Referral ICU  Palliative Care  Primary Diagnosis Cardiac  Date Notified 09/25/20  Palliative Care Type Return patient Palliative Care  Reason for referral Clarify Goals of Care  Date of Admission 09/23/20  Date first seen by Palliative Care 09/25/20  # of days Palliative referral response time 0 Day(s)  # of days IP prior to Palliative referral 2  Clinical Assessment   Psychosocial & Spiritual Assessment   Palliative Care Outcomes       Patient Active Problem List   Diagnosis Date Noted  . Pressure injury of skin 09/28/2020  . Coronary artery disease involving autologous vein bypass graft 09/25/2020  . Demand ischemia of myocardium ([place]) 09/25/2020  . Syncope and collapse 09/23/2020  . Body mass index (BMI) 32.0-32.9, adult 08/01/2020  . Cervical disc disorder 08/01/2020  . Cough variant asthma 08/01/2020  . Dependence on renal dialysis ([employer]) 08/01/2020  . Diabetic renal disease ([place]) 08/01/2020  . Diabetic retinopathy associated with type 2 diabetes mellitus ([place]) 08/01/2020  . Edema 08/01/2020  . Encounter for general adult medical examination without abnormal findings 08/01/2020  . Gout 08/01/2020  . Hereditary and idiopathic neuropathy, unspecified 08/01/2020  . Hyperglycemia due to type 2 diabetes mellitus ([place]) 08/01/2020  . Joint pain 08/01/2020  . 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 Osteoarthritis of knee 08/01/2020  . Other long term (current) drug therapy 08/01/2020  . Other polyosteoarthritis 08/01/2020  . Pain in unspecified knee 08/01/2020  . Proteinuria 08/01/2020  . Pure hypercholesterolemia 08/01/2020  . Pure hypertriglyceridemia 08/01/2020  . Urinary tract infectious disease 08/01/2020  . Vitamin D deficiency 08/01/2020  . Vomiting 08/01/2020  . Other disorders of phosphorus metabolism 01/11/2020  . Encounter for removal of sutures 12/04/2019  . Shortness of breath 07/29/2019  . Anaphylactic shock, unspecified, sequela 04/23/2019  . Secondary hyperparathyroidism of renal  origin ([place]) 09/21/2018  . Iron deficiency anemia, unspecified 06/08/2018  . Hemorrhoids   . Diabetes mellitus type 2 in nonobese (HCC)   . Anemia, chronic disease   . Labile blood pressure   . Poorly controlled type 2 diabetes mellitus with peripheral neuropathy ([place])   . Debility   . Benign essential HTN   . Bradycardia   . Ventricular tachycardia ([place])   . Enteritis due to Clostridium difficile   . ESRD on dialysis ([place])   . Diabetic peripheral neuropathy ([place])   . Acute blood loss anemia   . Anemia of chronic disease   . Aspiration into airway   . NSTEMI (non-ST elevated myocardial infarction) ([place])   . QT prolongation 04/03/2018  . Acute respiratory failure with hypoxia ([place])   . Aspiration pneumonia due to gastric secretions ([place])   . Cardiac arrest ([place]) 04/02/2018  . Mild protein-calorie malnutrition ([place]) 03/13/2018  . Anxiety 05/13/2017  . Panic attack 05/13/2017  . SAH (subarachnoid hemorrhage) ([place]) 02/03/2017  . Fall 02/03/2017  . Spinal stenosis, lumbar region 03/02/2016  . Trochanteric bursitis of left hip 01/03/2016  . Generalized weakness 12/21/2015  . Hypokalemia 12/21/2015  . Lumbar back pain with radiculopathy affecting left lower extremity 12/04/2015  . Coronary artery disease involving native coronary artery of native heart without angina pectoris   . CRI (chronic renal insufficiency), stage 4 (severe) (HCC)   . Urinary retention   . Anemia of chronic kidney failure   . Hx  of gout   . Thrombocytopenia ([place])   . S/P lumbar discectomy 12/01/2015  . 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 Acute encephalopathy   . Chronic kidney disease (CKD), stage V ([place]) 07/13/2015  . Glaucoma   . Paroxysmal atrial fibrillation (HCC)   . Coronary artery disease involving native heart without angina pectoris   . Depression   . Stroke ([place])   . Diabetes mellitus without complication ([place])    . Gastroesophageal reflux disease with esophagitis   . Carotid artery stenosis 04/21/2015  . Essential hypertension 06/08/2014  . Hyperlipidemia associated with type 2 diabetes mellitus ([place]) 06/08/2014  . Occlusion and stenosis of carotid artery without mention of cerebral infarction 01/01/2012    Palliative Care Assessment & Plan   Patient Profile: 80 y.o.femalewith past medical history of ESRD on dialysis TTS, coronary artery disease s/p CABG, carotid stenosis, atrial fibrillation, previous history of non-sustained v-tach, DM2, HTN, HLD, and hearing loss presenting to the emergency departmenton3/12/2022with multiple episodes of syncope.She reportedly had an episode right after dialysis. Had another episode at home, became unresponsive, and family started CPR. When EMS arrived, monitor showed nonsustained v-tach bigeminy, lidocaine infusion was started.  ED Course: On arrival to ED patient was back in sinus rhythm. Chest x-ray showed left lower lobe consolidation probably atelectasis versus infection or aspiration. She was admitted to [facility] for further evaluation of syncopal episodes.   On 3/13, patient was undergoing echocardiogram and developed symptomatic v-tach requiring CPR. She underwent emergent cardiac cath with findings of new 100% stenosis in multiple vessels. Patient was seen by EP, but is not a candidate for ICD.  Assessment: Syncopal episodes with collapse Symptomatic ventricular tachycardia Paroxysmal atrial fibrillation Coronary artery disease DM with nephropathy Essential hypertension ESRD on HD Anemia of chronic disease Generalized debility Deconditioned state Failure to thrive  Recommendations/Plan:  Continue current medical treatment  Continue DNR/DNI as previously documented  Patient's goal is to attempt dialysis in chair tomorrow 3/28 as recommended - she is unsure she will be able to tolerate entire treatment in chair due to hemorrhoid  pain  Increased percocet to 1-2 tablets q4hr PRN moderate/severe pain  Recommend premedicating for pain before transfers/getting up to chair  Ordered donut cushion: utilize when sitting in chair to assist with pressure relief/hemorrhoid pain. Send with patient to dialysis  Continue hydrocortisone rectal cream and suppositories, Tylenol 650mg  PO TID for sacral pain/discomfort,q2h turns, and air mattress   If patient tolerates dialysis treatment, she is agreeable for discharge to SNF rehab  Daughter understands patient is approaching EOL. As long as patient is eating, goal is to continue dialysis. Once patient stops eating, family will be ready for transition to hospice and stop HD. No PEG.  Ongoing GOC discussions pending clinical course  PMT will continue to follow and support holistically  Goals of Care and Additional Recommendations:  Limitations on Scope of Treatment: Full Scope Treatment and No Tracheostomy  Code Status:    Code Status Orders  (From admission, onward)         Start     Ordered   09/25/20 1446  Do not attempt resuscitation (DNR)  Continuous       Question Answer Comment  In the event of cardiac or respiratory ARREST Do not call a "code blue"   In the event of cardiac or respiratory ARREST Do not perform Intubation, CPR, defibrillation or ACLS   In the event of cardiac or  respiratory ARREST Use medication by any route, position, wound care, and other measures to relive pain and suffering. May use oxygen, suction and manual treatment of airway obstruction as needed for comfort.      09/25/20 1445        Code Status History    Date Active Date Inactive Code Status Order ID Comments User Context   09/25/2020 1258 09/25/2020 1445 Partial Code [id number]  Vickii Penna, RN Inpatient   09/24/2020 0155 09/25/2020 1258 Full Code [id number]  Elwyn Reach, MD Inpatient   04/16/2018 1024 04/28/2018 1821 Partial Code [id number]  Roney Jaffe, MD Inpatient    04/15/2018 1620 04/15/2018 1621 DNR [id number]  Cathlyn Parsons, PA-C Inpatient   04/15/2018 1620 04/15/2018 1620 Full Code [id number]  Cathlyn Parsons, PA-C Inpatient   04/13/2018 1711 04/15/2018 1542 DNR [id number]  Roney Jaffe, MD Inpatient   04/02/2018 2040 04/13/2018 1711 Full Code [id number]  Germain Osgood, PA-C ED   12/21/2015 1721 12/25/2015 1606 Partial Code [id number]  Samella Parr, NP Inpatient   12/04/2015 1653 12/15/2015 1451 Full Code [id number]  Bary Leriche, PA-C Inpatient   07/13/2015 1955 07/17/2015 1637 Partial Code [id number]  Ivor Costa, MD ED   04/21/2015 1658 04/22/2015 1624 Full Code [id number]  Gabriel Earing, PA-C Inpatient   07/29/2011 1802 08/03/2011 1434 Full Code [id number]  Raquel James, RN Inpatient   Advance Care Planning Activity       Prognosis:   < 6 months  Discharge Planning:  To Be Determined  Care plan was discussed with primary RN, patient, daughter/[name], Dr. Domingo Cocking  Thank you for allowing the Palliative Medicine Team to assist in the care of this patient.   Total Time 45 minutes Prolonged Time Billed  no       Greater than 50%  of this time was spent counseling and coordinating care related to the above assessment and plan.  Lin Landsman, NP  Please contact Palliative Medicine Team phone at [phone] for questions and concerns.

## 2020-10-08 NOTE — Plan of Care (Signed)
Progressing will continue to monitor.

## 2020-10-08 NOTE — Progress Notes (Signed)
[facility] Progress Note  Subjective:  Seen in room, up in chair today.     Vitals:   10/07/20 2009 10/08/20 0122 10/08/20 0507 10/08/20 0848  BP: 99/78 (!) 70/20 111/90 (!) 94/44  Pulse: 62 (!) 54 66 60  Resp: 20 19 20 16   Temp: 98 F (36.7 C) 98.3 F (36.8 C) 98.3 F (36.8 C) 97.9 F (36.6 C)  TempSrc: Oral Oral Axillary Oral  SpO2: 97% 96% 99% 97%  Height:        Exam General: Chronically ill appearing female, nad Heart: RRR, no murmur Lungs: CTA anteriorly without wheezing or rales Abdomen: Soft, non-distended, +BS. Extremities: no LE or UE edema Dialysis Access: LUE AVF + bruit; maturing RUA AVF+bruit     OP HD: NW TTS 4h  76.5kg  3K/Ca 2.5 bath  Hep none  LUE AVF - 100 mircera q 2 wks, last 3/08     Assessment/ Plan: 1. VT/ cardiac arrest/ CAD hx of CABG: cath 09/25/31 with severe native disease and new 100% ostial + prox CTO of SVG RCA treated w/ stenting. Seen by EP, not an ICD candidate. Treating medically , on po amiodarone.  2. ESRD - on HD TTS. Plan HD Monday instead of Tuesday here this week given patient would be able to discharge if she can sit in chair for dialysis.  3. HD access: due to steal symptoms in L hand, VVS placed new RUA BC AVF on 07/03/20. Idea is to ligate L arm AVF when the R AVF is operational.  4. Anemia ckd: Hgb 11.4 on 3/22. No esa given here. Hgb stable.  5. CKD-MBD: Checking RFP, continue binders and vits 6. Nutrition: protein supps 7. Hypotension/volume: BP's 80's- 100's. Midodrine 10 pre HD tiw started. At her dry wt.  8. DM2- on insulin, per pmd 9. DNR 10. Debility/ deconditioning/ FTT - pt will have to be able to dialyze sitting in a chair for 4 hrs here to be candidate for discharge to home or SNF.  11. EOL -  DNR now. Pall care working w/ pt and family, appreciate their assistance  [name] 10/08/2020, 12:07 PM   Recent Labs  Lab 10/03/20 0136 10/04/20 0114 10/07/20 0037 10/07/20 0830  10/08/20 0512 10/08/20 0513  K 4.3   < >  --  3.4* 3.9  --   BUN 44*   < >  --  34* 20  --   CREATININE 5.38*   < >  --  4.43* 3.16*  --   CALCIUM 9.1   < >  --  9.0 8.9  --   PHOS 3.3  --   --  2.8  --   --   HGB 11.4*   < > 11.6*  --   --  10.6*   < > = values in this interval not displayed.   Inpatient medications:  acetaminophen  650 mg Oral TID   allopurinol  100 mg Oral q AM   amiodarone  200 mg Oral BID   brimonidine  1 drop Right Eye QHS   And   brimonidine  2 drop Left Eye QHS   Chlorhexidine Gluconate Cloth  6 each Topical Q0600   cinacalcet  30 mg Oral Once per day on Tue Thu   clopidogrel  75 mg Oral q AM   feeding supplement (NEPRO CARB STEADY)  237 mL Oral TID BM   hydrocortisone   Rectal TID   hydrocortisone  25 mg Rectal BID  latanoprost  1 drop Both Eyes QHS   midodrine  10 mg Oral TID WC   multivitamin  1 tablet Oral QHS   polycarbophil  625 mg Oral BID WC   sevelamer carbonate  1,600 mg Oral BID WC   sodium chloride flush  3 mL Intravenous Q12H   timolol  1 drop Left Eye QHS    sodium chloride     sodium chloride     sodium chloride     sodium chloride, sodium chloride, acetaminophen, alteplase, famotidine, heparin, lidocaine (PF), lidocaine-prilocaine, nitroGLYCERIN, oxyCODONE-acetaminophen, pentafluoroprop-tetrafluoroeth, polyvinyl alcohol, prochlorperazine, witch hazel-glycerin

## 2020-10-08 NOTE — Evaluation (Signed)
Physical Therapy Evaluation Patient Details Name: [name] MRN: [id number] DOB: 26-Mar-1941 Today's Date: 10/08/2020   History of Present Illness  80 y.o. female presenting to [facility] ED on 09/23/2020 with multiple syncopal episodes after dialysis. Pt was found to be in nonsustained V tach upon EMS arrival. Chest xray demonstrates LLL consolidation. Pt experienced Vtahc cardiac arrest on 3/13 and was subsequently transferred to CVICU. Left heart cath on 3/13 revealed occlusion of the SVG-RCA. Pt began hemodialysis on 3/20. PMH significant of ESRD on hemodialysis TTS, CAD, carotid stenosis, a fib, previous history of nonsustained V. tach, diabetes, HTN, HLD, hard of hearing.  Clinical Impression  Pt presents to PT with deficits in functional mobility, gait, balance, strength, power, endurance, and with significant pain limiting sitting tolerance. Pt is generally weak and requires significant physical assistance for all out of bed mobility. Pt reporting pain after sitting in recliner for <5 minutes, PT provides pillow to facilitate weight shift and offload buttocks some with mild improvement in symptoms. PT feels sitting for multiple hours may be ambitious at this time due to pain, and due to the fact that this seems to be the first time the patient has been out of bed in multiple days and possibly weeks. Pt will benefit from continued acute PT POC to improve functional mobility and to reduce falls risk and caregiver burden. PT recommends SNF placement pending family and patient goals of care.    Follow Up Recommendations SNF (pending goals of care)    Equipment Recommendations  Hospital bed    Recommendations for Other Services       Precautions / Restrictions Precautions Precautions: Fall;Other (comment) Precaution Comments: monitor HR and vitals Restrictions Weight Bearing Restrictions: No      Mobility  Bed Mobility Overal bed mobility: Needs Assistance Bed Mobility: Supine to Sit      Supine to sit: Min assist;HOB elevated          Transfers Overall transfer level: Needs assistance Equipment used: 1 person hand held assist Transfers: Sit to/from [employer] Transfers Sit to Stand: Mod assist Stand pivot transfers: Mod assist          Ambulation/Gait                Stairs            Wheelchair Mobility    Modified Rankin (Stroke Patients Only)       Balance Overall balance assessment: Needs assistance Sitting-balance support: No upper extremity supported;Feet supported Sitting balance-Leahy Scale: Fair     Standing balance support: Bilateral upper extremity supported Standing balance-Leahy Scale: Poor Standing balance comment: modA with BUE support of PT                             Pertinent Vitals/Pain Pain Assessment: Faces Faces Pain Scale: Hurts whole lot Pain Location: buttocks with sitting in recliner Pain Descriptors / Indicators: Grimacing Pain Intervention(s): Monitored during session;Patient requesting pain meds-RN notified    Home Living Family/patient expects to be discharged to:: Private residence Living Arrangements: Spouse/significant other Available Help at Discharge: Family;Available PRN/intermittently Type of Home: House Home Access: Level entry     Home Layout: Two level Home Equipment: Walker - 2 wheels;Wheelchair - manual;Cane - single point      Prior Function Level of Independence: Independent with assistive device(s)         Comments: pt ambulates with RW at baseline, reports history of falls  Hand Dominance        Extremity/Trunk Assessment   Upper Extremity Assessment Upper Extremity Assessment: Generalized weakness    Lower Extremity Assessment Lower Extremity Assessment: Generalized weakness    Cervical / Trunk Assessment Cervical / Trunk Assessment: Kyphotic  Communication   Communication: No difficulties  Cognition Arousal/Alertness:  Awake/alert Behavior During Therapy: WFL for tasks assessed/performed Overall Cognitive Status: Impaired/Different from baseline Area of Impairment: Orientation                 Orientation Level: Disoriented to;Place                    General Comments General comments (skin integrity, edema, etc.): pt with multiple PVCs when mobilizing, sats stable on RA, HR in 60-70s. BP stable.    Exercises General Exercises - Lower Extremity Ankle Circles/Pumps: AROM;Both;20 reps Quad Sets: AROM;Both;5 reps Heel Slides: AROM;Both;5 reps   Assessment/Plan    PT Assessment Patient needs continued PT services  PT Problem List Decreased strength;Decreased activity tolerance;Decreased balance;Decreased mobility;Decreased knowledge of use of DME;Cardiopulmonary status limiting activity;Pain       PT Treatment Interventions DME instruction;Gait training;Functional mobility training;Therapeutic activities;Therapeutic exercise;Balance training;Patient/family education;Wheelchair mobility training    PT Goals (Current goals can be found in the Care Plan section)  Acute Rehab PT Goals Patient Stated Goal: to reduce pain in buttocks PT Goal Formulation: With patient Time For Goal Achievement: 10/22/20 Potential to Achieve Goals: Fair    Frequency Min 2X/week   Barriers to discharge        Co-evaluation               AM-PAC PT "6 Clicks" Mobility  Outcome Measure Help needed turning from your back to your side while in a flat bed without using bedrails?: A Little Help needed moving from lying on your back to sitting on the side of a flat bed without using bedrails?: A Little Help needed moving to and from a bed to a chair (including a wheelchair)?: A Lot Help needed standing up from a chair using your arms (e.g., wheelchair or bedside chair)?: A Lot Help needed to walk in hospital room?: A Lot Help needed climbing 3-5 steps with a railing? : Total 6 Click Score: 13     End of Session   Activity Tolerance: Patient limited by pain Patient left: in chair;with call bell/phone within reach;with chair alarm set Nurse Communication: Mobility status;Need for lift equipment (STEDY) PT Visit Diagnosis: Unsteadiness on feet (R26.81);Muscle weakness (generalized) (M62.81);History of falling (Z91.81);Pain Pain - part of body:  (buttocks)    Time: 1103-1130 PT Time Calculation (min) (ACUTE ONLY): 27 min   Charges:   PT Evaluation $PT Eval Moderate Complexity: 1 Mod          Zenaida Niece, PT, DPT Acute Rehabilitation Pager: [phone]   Zenaida Niece 10/08/2020, 12:30 PM

## 2020-10-09 LAB — BASIC METABOLIC PANEL
Anion gap: 8 (ref 5–15)
BUN: 31 mg/dL — ABNORMAL HIGH (ref 8–23)
CO2: 29 mmol/L (ref 22–32)
Calcium: 9 mg/dL (ref 8.9–10.3)
Chloride: 98 mmol/L (ref 98–111)
Creatinine, Ser: 4.11 mg/dL — ABNORMAL HIGH (ref 0.44–1.00)
GFR, Estimated: 10 mL/min — ABNORMAL LOW (ref >60)
Glucose, Bld: 234 mg/dL — ABNORMAL HIGH (ref 70–99)
Potassium: 4.8 mmol/L (ref 3.5–5.1)
Sodium: 135 mmol/L (ref 135–145)

## 2020-10-09 LAB — CBC
HCT: 31 % — ABNORMAL LOW (ref 36.0–46.0)
Hemoglobin: 9.7 g/dL — ABNORMAL LOW (ref 12.0–15.0)
MCH: 32 pg (ref 26.0–34.0)
MCHC: 31.3 g/dL (ref 30.0–36.0)
MCV: 102.3 fL — ABNORMAL HIGH (ref 80.0–100.0)
Platelets: 251 10*3/uL (ref 150–400)
RBC: 3.03 MIL/uL — ABNORMAL LOW (ref 3.87–5.11)
RDW: 16 % — ABNORMAL HIGH (ref 11.5–15.5)
WBC: 10.3 10*3/uL (ref 4.0–10.5)
nRBC: 0.2 % (ref 0.0–0.2)

## 2020-10-09 MED ORDER — OXYCODONE-ACETAMINOPHEN 5-325 MG PO TABS
ORAL_TABLET | ORAL | Status: AC
  Administered 2020-10-09: 2 via ORAL
  Filled 2020-10-09: qty 2

## 2020-10-09 NOTE — Progress Notes (Signed)
Patient rhythm torsades then had 17 seconds of Asystole and Agonal breathing then rhythm junct and  S.T. then back to S.B.  Patient very sleeping did open eyes. Family is at bedside.

## 2020-10-09 NOTE — Care Management Important Message (Signed)
Important Message  Patient Details  Name: [name] MRN: [id number] Date of Birth: [birth year]   Medicare Important Message Given:  Yes     Shelda Altes 10/09/2020, 8:49 AM

## 2020-10-09 NOTE — Progress Notes (Signed)
[facility] Progress Note  Subjective:  Seen in room, lying in bed. Said she was miserable yesterday with fatigue and rectal pain. Improved today    Vitals:   10/09/20 0839 10/09/20 0900 10/09/20 0930 10/09/20 1000  BP: (!) 166/47 (!) 128/35 (!) 97/29 (!) 82/42  Pulse: 65 63 74 68  Resp: 17 16 (!) 24 14  Temp: 97.7 F (36.5 C)     TempSrc: Oral     SpO2: 98% 98% 94% 96%  Height:        Exam General: Chronically ill appearing female, nad Heart: normal rate, no audible murmur Lungs: lying flat, no iwob, bilateral chest rise Abdomen: Soft, non-distended, +BS. Extremities: no LE or UE edema Dialysis Access: LUE AVF + bruit; maturing RUA AVF+bruit     OP HD: NW TTS 4h  76.5kg  3K/Ca 2.5 bath  Hep none  LUE AVF - 100 mircera q 2 wks, last 3/08     Assessment/ Plan: 1. VT/ cardiac arrest/ CAD hx of CABG: cath 09/25/31 with severe native disease and new 100% ostial + prox CTO of SVG RCA treated w/ stenting. Seen by EP, not an ICD candidate. Treating medically , on amiodarone 2. ESRD - on HD TTS. Plan HD today instead of Tuesday this week as patient would be able to discharge if she can sit in chair for dialysis.  3. HD access: due to steal symptoms in L hand, VVS placed new RUA BC AVF on 07/03/20. Idea is to ligate L arm AVF when the R AVF is operational.  4. Anemia ckd: Hgb 9.7. Had been >10. CTM for now 5. CKD-MBD: continue binders and vits 6. Nutrition: protein supps 7. Hypotension/volume: BP's 80's- 100's. Midodrine 10 pre HD tiw started. At her dry wt.  8. DM2- on insulin, per pmd 9. DNR 10. Debility/ deconditioning/ FTT - pt will have to be able to dialyze sitting in a chair for 4 hrs here to be candidate for discharge to home or SNF.  11. EOL -  DNR now. Pall care working w/ pt and family, appreciate their assistance  [name]  10/09/2020, 10:12 AM   Recent Labs  Lab 10/03/20 0136 10/04/20 0114 10/07/20 0830 10/08/20 0512 10/08/20 0513  10/09/20 0121 10/09/20 0920  K 4.3   < > 3.4* 3.9  --  4.8  --   BUN 44*   < > 34* 20  --  31*  --   CREATININE 5.38*   < > 4.43* 3.16*  --  4.11*  --   CALCIUM 9.1   < > 9.0 8.9  --  9.0  --   PHOS 3.3  --  2.8  --   --   --   --   HGB 11.4*   < >  --   --  10.6*  --  9.7*   < > = values in this interval not displayed.   Inpatient medications: . acetaminophen  650 mg Oral TID  . allopurinol  100 mg Oral q AM  . amiodarone  200 mg Oral BID  . brimonidine  1 drop Right Eye QHS   And  . brimonidine  2 drop Left Eye QHS  . Chlorhexidine Gluconate Cloth  6 each Topical Q0600  . cinacalcet  30 mg Oral Once per day on Tue Thu  . clopidogrel  75 mg Oral q AM  . feeding supplement (NEPRO CARB STEADY)  237 mL Oral TID BM  . hydrocortisone  Rectal TID  . hydrocortisone  25 mg Rectal BID  . latanoprost  1 drop Both Eyes QHS  . midodrine  10 mg Oral TID WC  . multivitamin  1 tablet Oral QHS  . polycarbophil  625 mg Oral BID WC  . sevelamer carbonate  1,600 mg Oral BID WC  . sodium chloride flush  3 mL Intravenous Q12H  . timolol  1 drop Left Eye QHS   . sodium chloride    . sodium chloride    . sodium chloride     sodium chloride, sodium chloride, acetaminophen, alteplase, famotidine, heparin, lidocaine (PF), lidocaine-prilocaine, nitroGLYCERIN, oxyCODONE-acetaminophen, pentafluoroprop-tetrafluoroeth, polyvinyl alcohol, prochlorperazine, witch hazel-glycerin

## 2020-10-09 NOTE — Consult Note (Signed)
[place] Nurse wound follow up Patient receiving care in [place]. 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 Measurement: 5 cm x 5 cm Wound bed: 100% pink Drainage (amount, consistency, odor) none Periwound: intact Dressing procedure/placement/frequency: continue use of foam dressing.  Patient is on a low air loss mattress. Monitor the wound area(s) for worsening of condition such as: Signs/symptoms of infection,  Increase in size,  Development of or worsening of odor, Development of pain, or increased pain at the affected locations.  Notify the medical team if any of these develop. Val Riles, RN, MSN, CWOCN, CNS-BC, pager [phone]

## 2020-10-09 NOTE — Progress Notes (Signed)
PROGRESS NOTE    [name]  SFK:[id number] DOB: 08-Jul-1941 DOA: 09/23/2020 PCP: Josetta Huddle, MD    Brief Narrative:  This 80 y.o.femalewith medical history significant ofend-stage renal disease on hemodialysis TTS, coronary artery disease, carotid stenosis, atrial fibrillation, previous history of nonsustained V. tach, diabetes, essential hypertension, hyperlipidemia, hard of hearing who presented with multiple episodes of syncope prior to admission, later found to have vtach by EMS. Pt was admitted for further work up.  Patient was scheduled to undergo left heart cath on 3/14 when she sustained an episode of symptomatic V. Tach (code blue), requiring brief chest compressions with ROSC. Later she underwent emergent cath on 3/13 with new 100% ostial and proximal CTO of SVG RCA. EP was consulted, not a candidate for ICD.  CT angio showed no PE.  Patient continued to have several episodes of nonsustained V. tach or ventricular bigeminy, hypoxia. Cardiology recommended amiodarone, no beta-blocker due to bradycardia. Palliative medicine following, goals of care discussed, DNR/DNI, for now,  continue full scope medical treatment. Nephrology following for hemodialysis.  Assessment & Plan:   Principal Problem:   Ventricular tachycardia ([place]) Active Problems:   Essential hypertension   Hyperlipidemia associated with type 2 diabetes mellitus (HCC)   Paroxysmal atrial fibrillation (HCC)   Coronary artery disease involving native heart without angina pectoris   Hypokalemia   QT prolongation   ESRD on dialysis (HCC)   Anemia of chronic disease   Diabetes mellitus type 2 in nonobese [facility])   Syncope and collapse   Coronary artery disease involving autologous vein bypass graft   Demand ischemia of myocardium (HCC)   Pressure injury of skin   Recurrent symptomatic ventricular tachycardia with multiple syncopal episodes ([place]), hypotension -Cardiac cath on 3/13 with findings of new 100%  ostial and proximal CTO of SVG RCA. -Seen by EP, not a candidate for ICD.  No further invasive intervention from cardiac standpoint. -Continue aspirin, Plavix, statin -Cardiology following, now transitioned to oral amiodarone. -Palliative medicine was consulted, currently goals DNR/DNI, continue full scope medical treatment.   -Per nephrology, will Continue dialysis if patient is able to tolerate, will consider transitioning to comfort care if she cannot tolerate. 10/01/2020: Cardiology input is highly appreciated.  Paroxysmal atrial fibrillation-postop -Received amiodarone bolus with drip, now transitioned to oral amiodarone. -Cardiology following, not a candidate for anticoagulation due to previous brain bleed.  Diabetes mellitus type 2, IDDM, with nephropathy -Hemoglobin A1c 5.0 -Continue sliding scale insulin  Hypotension : Currently on midodrine.  Improving.  ESRD on hemodialysis, TTS -Nephrology following, plan to continue HD and assess if she is able to tolerate.   If patient is not able to tolerate HD, will consider pursuing comfort care.  Palliative medicine is following 10/01/2020: Patient underwent hemodialysis. Next HD 3/24. She is tolerating hemodialysis. Continue as per schedule. Patient should be able to dialyze sitting in the chair for 4 to 6 hours for to be considered to be discharged home or nursing home, other option would be LTAC. Plan: Hemodialysis on Monday instead of Tuesday if she can sit in the chair for dialysis,  she can be discharged. Patient could not sit in the chair due to having protuberant hemorrhoids.  She may get pain medication before getting dialysis on next session.  Anemia of chronic disease : -Continue ESA, H&H currently stable.  Hemoglobin is currently at goal.  Transaminitis : Resolved. -Possibly due to #1, hypotension, no acute complaints/abdominal pain -LFTs back to normal.  Generalized debility, very deconditioned, FTT -  Slightly  alert and oriented today. , increase mobility, PT OT. -Patient's appetite has unfortunately not improved with Marinol. -Family understand that she is approaching end-of-life.  Obesity Estimated body mass index is 28.61 kg/m as calculated from the following:   Height as of this encounter: 5\' 4"  (1.626 m).   Weight as of this encounter: 75.6 kg.   DVT prophylaxis: heparin sq Code Status: DNR Family Communication: Spoke with daughter on phone and updated. Disposition Plan:  Status is: Inpatient  Remains inpatient appropriate because:Inpatient level of care appropriate due to severity of illness   Dispo: The patient is from: Home              Anticipated d/c is to: TBD              Patient currently is not medically stable to d/c.   Difficult to place patient No   Consultants:    Nephrology  Procedures: [facility] 3/13  Antimicrobials:   Anti-infectives (From admission, onward)   None      Subjective: Patient was seen and examined in HD suite.  Overnight events noted.    She is alert and much oriented, She gets into full conversation.  She states she could not sit in the chair for long because of having bad hemorrhoids.   Objective: Vitals:   10/09/20 1130 10/09/20 1200 10/09/20 1240 10/09/20 1310  BP: (!) 68/27 (!) 70/30 (!) 99/42 (!) 103/33  Pulse: 78 75 77   Resp: 15 19 15    Temp:   98 F (36.7 C) 98 F (36.7 C)  TempSrc:   Oral Oral  SpO2: 96% 97%    Height:        Intake/Output Summary (Last 24 hours) at 10/09/2020 1431 Last data filed at 10/09/2020 1240 Gross per 24 hour  Intake --  Output 500 ml  Net -500 ml   Filed Weights    Examination:  General exam: Appears calm and comfortable, not in any acute distress. Respiratory system: Clear to auscultation. Respiratory effort normal. Cardiovascular system: S1 & S2 heard, RRR. No JVD, murmurs, rubs, gallops or clicks. No pedal edema. Gastrointestinal system: Abdomen is nondistended, soft and nontender. No  organomegaly or masses felt.  Normal bowel sounds heard. Central nervous system: Alert and oriented x1 . No focal neurological deficits. Extremities: Symmetric 5 x 5 power.  No edema, no cyanosis, no clubbing. Skin: No rashes, lesions or ulcers Psychiatry: Judgement and insight appear normal. Mood & affect appropriate.     Data Reviewed: I have personally reviewed following labs and imaging studies  CBC: Recent Labs  Lab 10/05/20 0052 10/06/20 0217 10/07/20 0037 10/08/20 0513 10/09/20 0920  WBC 17.2* 22.3* 17.4* 11.4* 10.3  HGB 12.2 12.3 11.6* 10.6* 9.7*  HCT 37.2 37.3 36.8 34.3* 31.0*  MCV 99.5 99.7 102.2* 104.3* 102.3*  PLT 233 252 253 239 371   Basic Metabolic Panel: Recent Labs  Lab 10/03/20 0136 10/04/20 0114 10/05/20 0052 10/06/20 1008 10/07/20 0830 10/08/20 0512 10/09/20 0121  NA 131*   < > 130* 134* 133* 137 135  K 4.3   < > 3.8 3.4* 3.4* 3.9 4.8  CL 95*   < > 93* 94* 93* 100 98  CO2 24   < > 24 29 30 28 29   GLUCOSE 174*   < > 185* 173* 160* 124* 234*  BUN 44*   < > 38* 21 34* 20 31*  CREATININE 5.38*   < > 4.74* 3.47* 4.43* 3.16* 4.11*  CALCIUM 9.1   < > 9.1 9.0 9.0 8.9 9.0  MG 2.1  --   --   --   --   --   --   PHOS 3.3  --   --   --  2.8  --   --    < > = values in this interval not displayed.   GFR: Estimated Creatinine Clearance: 10.9 mL/min (A) (by C-G formula based on SCr of 4.11 mg/dL (H)). Liver Function Tests: Recent Labs  Lab 10/03/20 0136 10/07/20 0830  AST 20  --   ALT 29  --   ALKPHOS 81  --   BILITOT 0.5  --   PROT 5.9*  --   ALBUMIN 2.3* 2.0*   No results for input(s): LIPASE, AMYLASE in the last 168 hours. No results for input(s): AMMONIA in the last 168 hours. Coagulation Profile: No results for input(s): INR, PROTIME in the last 168 hours. Cardiac Enzymes: No results for input(s): CKTOTAL, CKMB, CKMBINDEX, TROPONINI in the last 168 hours. BNP (last 3 results) No results for input(s): PROBNP in the last 8760  hours. HbA1C: No results for input(s): HGBA1C in the last 72 hours. CBG: Recent Labs  Lab 10/05/20 1206 10/05/20 1913 10/05/20 2134 10/06/20 0601 10/06/20 1136  GLUCAP 204* 185* 181* 153* 168*   Lipid Profile: No results for input(s): CHOL, HDL, LDLCALC, TRIG, CHOLHDL, LDLDIRECT in the last 72 hours. Thyroid Function Tests: No results for input(s): TSH, T4TOTAL, FREET4, T3FREE, THYROIDAB in the last 72 hours. Anemia Panel: No results for input(s): VITAMINB12, FOLATE, FERRITIN, TIBC, IRON, RETICCTPCT in the last 72 hours. Sepsis Labs: No results for input(s): PROCALCITON, LATICACIDVEN in the last 168 hours.  No results found for this or any previous visit (from the past 240 hour(s)).  Radiology Studies: No results found.  Scheduled Meds: . acetaminophen  650 mg Oral TID  . allopurinol  100 mg Oral q AM  . amiodarone  200 mg Oral BID  . brimonidine  1 drop Right Eye QHS   And  . brimonidine  2 drop Left Eye QHS  . Chlorhexidine Gluconate Cloth  6 each Topical Q0600  . cinacalcet  30 mg Oral Once per day on Tue Thu  . clopidogrel  75 mg Oral q AM  . feeding supplement (NEPRO CARB STEADY)  237 mL Oral TID BM  . hydrocortisone   Rectal TID  . hydrocortisone  25 mg Rectal BID  . latanoprost  1 drop Both Eyes QHS  . midodrine  10 mg Oral TID WC  . multivitamin  1 tablet Oral QHS  . polycarbophil  625 mg Oral BID WC  . sevelamer carbonate  1,600 mg Oral BID WC  . sodium chloride flush  3 mL Intravenous Q12H  . timolol  1 drop Left Eye QHS   Continuous Infusions: . sodium chloride    . sodium chloride    . sodium chloride       LOS: 16 days    Time spent: 25 mins    Shawna Clamp, MD [facility]   If 7PM-7AM, please contact night-coverage

## 2020-10-09 NOTE — Progress Notes (Signed)
Pt requested to sit up in the chair upon returning from HD at approximately 1pm.  Pt at this time has spent more than 4 hours up in her chair and states that she is more comfortable in the chair than she was in her hospital bed.  Will continue to monitor.

## 2020-10-09 NOTE — Progress Notes (Addendum)
Patient resting in bed and talking . Having freq. Runs of V-Tach then back to [place] [employer] then starting having freq runs of Tors[place] and was symptomatic with it. Was unresp. And shaking for a few seconds than back awake and talking. As rhythm changes with V-tach and Torsades she is symptomatic. She Becomes unresp. And agonal breathing for few seconds. Family notified and wants patient to be comfort care now and on their way here to be with patient. Patient stated to me ,"I feel like I am coming and going."  Patient stated, "I am not in any pain."Dr. Oypd called and made aware of family request and patient cond. Patient is now comfort care.

## 2020-10-10 MED ORDER — POLYVINYL ALCOHOL 1.4 % OP SOLN: 1.0000 [drp] | Freq: Four times a day (QID) | OPHTHALMIC | Status: DC | PRN

## 2020-10-10 MED ORDER — HALOPERIDOL LACTATE 2 MG/ML PO CONC
0.5000 mg | ORAL | Status: DC | PRN
  Filled 2020-10-10: qty 0.3

## 2020-10-10 MED ORDER — HYDROMORPHONE HCL 1 MG/ML IJ SOLN: 1.0000 mg | INTRAMUSCULAR | Status: DC | PRN

## 2020-10-10 MED ORDER — HYDROCORT-PRAMOXINE (PERIANAL) 1-1 % EX FOAM
1.0000 | Freq: Two times a day (BID) | CUTANEOUS | Status: DC
  Administered 2020-10-10 – 2020-10-12 (×5): 1 via RECTAL
  Filled 2020-10-10 (×2): qty 10

## 2020-10-10 MED ORDER — TRAZODONE HCL 50 MG PO TABS: 25.0000 mg | ORAL_TABLET | Freq: Every evening | ORAL | Status: DC | PRN

## 2020-10-10 MED ORDER — HALOPERIDOL 0.5 MG PO TABS
0.5000 mg | ORAL_TABLET | ORAL | Status: DC | PRN
  Filled 2020-10-10: qty 1

## 2020-10-10 MED ORDER — GLYCOPYRROLATE 1 MG PO TABS
1.0000 mg | ORAL_TABLET | ORAL | Status: DC | PRN
  Filled 2020-10-10: qty 1

## 2020-10-10 MED ORDER — ACETAMINOPHEN 650 MG RE SUPP: 650.0000 mg | Freq: Four times a day (QID) | RECTAL | Status: DC | PRN

## 2020-10-10 MED ORDER — HALOPERIDOL LACTATE 5 MG/ML IJ SOLN: 0.5000 mg | INTRAMUSCULAR | Status: DC | PRN

## 2020-10-10 MED ORDER — BIOTENE DRY MOUTH MT LIQD: 15.0000 mL | OROMUCOSAL | Status: DC | PRN

## 2020-10-10 MED ORDER — GLYCOPYRROLATE 0.2 MG/ML IJ SOLN: 0.2000 mg | INTRAMUSCULAR | Status: DC | PRN

## 2020-10-10 MED ORDER — LOPERAMIDE HCL 2 MG PO CAPS: 2.0000 mg | ORAL_CAPSULE | ORAL | Status: DC | PRN

## 2020-10-10 MED ORDER — DIPHENHYDRAMINE HCL 50 MG/ML IJ SOLN: 12.5000 mg | INTRAMUSCULAR | Status: DC | PRN

## 2020-10-10 MED ORDER — MORPHINE SULFATE (CONCENTRATE) 10 MG/0.5ML PO SOLN
5.0000 mg | ORAL | Status: DC | PRN
Start: 2020-10-10 — End: 2020-10-10

## 2020-10-10 MED ORDER — ONDANSETRON HCL 4 MG/2ML IJ SOLN: 4.0000 mg | Freq: Four times a day (QID) | INTRAMUSCULAR | Status: DC | PRN

## 2020-10-10 MED ORDER — ONDANSETRON 4 MG PO TBDP: 4.0000 mg | ORAL_TABLET | Freq: Four times a day (QID) | ORAL | Status: DC | PRN

## 2020-10-10 MED ORDER — OXYCODONE-ACETAMINOPHEN 5-325 MG PO TABS: 1.0000 | ORAL_TABLET | ORAL | Status: DC | PRN

## 2020-10-10 MED ORDER — ACETAMINOPHEN 325 MG PO TABS
650.0000 mg | ORAL_TABLET | Freq: Four times a day (QID) | ORAL | Status: DC | PRN
  Administered 2020-10-12: 650 mg via ORAL
  Filled 2020-10-10: qty 2

## 2020-10-10 MED ORDER — MORPHINE SULFATE (CONCENTRATE) 10 MG/0.5ML PO SOLN: 5.0000 mg | ORAL | Status: DC | PRN

## 2020-10-10 MED ORDER — LORAZEPAM 2 MG/ML IJ SOLN: 1.0000 mg | INTRAMUSCULAR | Status: DC | PRN

## 2020-10-10 NOTE — TOC Progression Note (Signed)
Transition of Care [facility]) - Progression Note    Patient Details  Name: [name] MRN: [id number] Date of Birth: Dec 22, 1940  Transition of Care [facility]) CM/SW [place], RN Phone Number: 10/10/2020, 1:28 PM  Clinical Narrative:    Case management met with the patient at the bedside regarding transitions of care needs.  The patient states that she is hopeful to speak with palliative care today in regards to hospice care for inpatient hospice - possibly in [facility].  The patient had episode of cardiac instability last night and is currently under comfort care orders and patient has declined continuance of HD treatments.  The patient's RN was at bedside with the patient after patient had episode of rectal bleeding and rectal pain possibly due to current hemoroids.  The patient was alert and oriented and she states she was living with her spouse prior to her admission to the hospital.  Palliative care plans to meet with the patient today to discuss palliative / hospice care supports.       Expected Discharge Plan: [place] Barriers to Discharge: Continued Medical Work up (Patient alert and currently under comfort care orders,)  Expected Discharge Plan and Services Expected Discharge Plan: [place] In-house Referral: Clinical Social Work,Hospice / Palliative Care Discharge Planning Services: CM Consult Post Acute Care Choice: Hospice Living arrangements for the past 2 months: Single Family Home                                       Social Determinants of Health (SDOH) Interventions    Readmission Risk Interventions Readmission Risk Prevention Plan 10/10/2020  Transportation Screening Complete  PCP or Specialist Appt within 3-5 Days Complete  HRI or Home Care Consult Complete  Social Work Consult for [place] Planning/Counseling Complete  Palliative Care Screening Complete  Medication Review Human resources officer) Complete  Some recent data might be hidden

## 2020-10-10 NOTE — Progress Notes (Signed)
VAST consulted to assess current PIV and obtain new access if it isn't working appropriately. Assessed PIV in patient's right arm. Patient winced and verbalized pain with NS flush. IV discontinued. Spoke with pt's nurse, Delsa Sale who stated patient's practitioners wanted her to have an IV "just in case". SecureChat sent to Erin Hearing, NP. educated it is best practice not to place an IV that is not currently needed to decrease infection risk and allow for vein preservation. Further educated if pt's condition changes and IV access is needed, [facility] will come obtain IV access at that time.

## 2020-10-10 NOTE — Progress Notes (Signed)
Renal Navigator appreciates notification of plan to transition to comfort care overnight. Navigator has updated patient's outpatient HD clinic[facility] of this news.   Alphonzo Cruise, [place] Renal Navigator [phone]

## 2020-10-10 NOTE — Progress Notes (Signed)
[facility] PROGRESS NOTE  Annelise Mccoy AJO:[id number] DOB: 1941/01/19 DOA: 09/23/2020 PCP: Josetta Huddle, MD  Status:  Remains inpatient appropriate because:Unsafe d/c plan, IV treatments appropriate due to intensity of illness or inability to take PO and Inpatient level of care appropriate due to severity of illness   Dispo: The patient is from: Home              Anticipated d/c is to: Residential Hospice vs home with Hospice              Patient currently is medically stable to d/c.   Difficult to place patient Yes   Level of care: Telemetry Cardiac-COMFORT MEASURES  Code Status: DNR Family Communication:  DVT prophylaxis: SQ Heparin-now dc'd due to comfort measures status Vaccination status: Unknown  Foley catheter: No  HPI: 80 y.o.femalewith medical history significant ofend-stage renal disease on hemodialysis TTS, coronary artery disease, carotid stenosis, atrial fibrillation, previous history of nonsustained V. tach, diabetes, essential hypertension, hyperlipidemia, hard of hearing who presented with multiple episodes of syncope prior to admission, later found to have vtach by EMS. Pt was admitted for further work up.  Patient was scheduled to undergo left heart cath on 3/14 when she sustained an episode of symptomatic V. Tach (code blue), requiring brief chest compressions with ROSC. Later she underwent emergent cath on 3/13 with new 100% ostial and proximal CTO of SVG RCA. EP was consulted, not a candidate for ICD. CT angio showed no PE. Patient continued to have several episodes of nonsustained V. tach or ventricular bigeminy, hypoxia. Cardiology recommended amiodarone, no beta-blocker due to bradycardia. Palliative medicine following, goals of care discussed, DNR/DNI, for now,  continue full scope medical treatment. Nephrology following for hemodialysis.  Subjective: Patient is awake and primarily complaining of rectal pain.  Nursing states she is having  bleeding from her hemorrhoids.  Discussed with patient plan for palliative care to meet later today to outline options regarding transition to comfort measures.  Explained to patient that I would initiate comfort care orders.  Explained to her expectations regarding withdrawal of care including dialysis and anticipate no longer been too weak survival although could be longer.  Discussed with her that given her underlying cardiac issues any abnormalities in potassium and other electrolytes could precipitate abnormal rhythms that she experienced overnight as well.  Objective: Vitals:   10/09/20 1940 10/10/20 0730  BP: (!) 85/34 (!) 112/27  Pulse: 62 64  Resp: 16 12  Temp: 98 F (36.7 C) 98.5 F (36.9 C)  SpO2: 96% 97%    Intake/Output Summary (Last 24 hours) at 10/10/2020 9470 Last data filed at 10/10/2020 0604 Gross per 24 hour  Intake 300 ml  Output 500 ml  Net -200 ml   Filed Weights    Exam:  Constitutional: NAD, calm, uncomfortable 2/2 rectal bleeding from hemorrhoids Respiratory: clear to auscultation bilaterally. Normal respiratory effort. No accessory muscle use.  Room air with saturations between 95 to 98% Cardiovascular: Regular rate and rhythm, no murmurs / rubs / gallops. No extremity edema.   Abdomen: no tenderness, Bowel sounds positive.  Nursing describes active rectal bleeding.  Patient had just been turned so will not turn her again to examine but nurse and patient both confirm significant issues with hemorrhoids. Neurologic: CN 2-12 grossly intact. Sensation intact, Strength 4/5 x all 4 extremities.  Psychiatric: Normal judgment and insight. Alert and oriented x 3. Normal mood.    Assessment/Plan: Acute problems: Recurrent symptomatic ventricular tachycardia with multiple syncopal  episodes ([place]), hypotension -Cardiac cath on 3/13 with findings of new 100% ostial and proximal CTO of SVG RCA. -Seen by EP, not a candidate for ICD. No further invasive intervention  from cardiac standpoint. -Now on comfort measures so we will discontinue aspirin, Plavix, statin -Appreciate cardiology assistance.  Now that patient is comfort measures and has been documented as having some sinus pauses with?  Asystole will discontinue amiodarone -Palliative medicine also assisting with management of end-of-life issues and will see patient again today on 3/9 -As of 3/29 I have notified nephrology that patient is full comfort care will no longer require dialysis  Paroxysmal atrial fibrillation-postop -Received amiodarone bolus with drip, transitioned to oral amiodarone which has been discontinued as above. -Cardiology following, not a candidate for anticoagulation due to previous brain bleed.  Diabetes mellitus type 2, IDDM, with nephropathy -Hemoglobin A1c 5.0 -Discontinue sliding scale insulin and CBG checks  Hypotension : -Discontinue midodrine  ESRD on hemodialysis, TTS -Patient has decided to pursue comfort care therefore dialysis has been discontinued -Nephrologist notified.  Renal navigator also notified so outpatient dialysis center can be made aware of change in plan.  Anemia of chronic disease : -We will no longer follow labs since she is comfort care  Transaminitis : Resolved.  Generalized debility, very deconditioned, FTT/Goals of CARE -Patient is now comfort care and plan will be to focus on providing a dignified death. -End-of-life order set initiated with focus on treatment of pain with narcotics, provide anxiolytics, other symptom management.  Please see orders for details.  Obesity Estimated body mass index is 28.61 kg/m as calculated from the following: Height as of this encounter: 5\' 4"  (1.626 m). Weight as of this encounter: 75.6 kg.      Data Reviewed: Basic Metabolic Panel: Recent Labs  Lab 10/05/20 0052 10/06/20 1008 10/07/20 0830 10/08/20 0512 10/09/20 0121  NA 130* 134* 133* 137 135  K 3.8 3.4* 3.4* 3.9 4.8  CL  93* 94* 93* 100 98  CO2 24 29 30 28 29   GLUCOSE 185* 173* 160* 124* 234*  BUN 38* 21 34* 20 31*  CREATININE 4.74* 3.47* 4.43* 3.16* 4.11*  CALCIUM 9.1 9.0 9.0 8.9 9.0  PHOS  --   --  2.8  --   --    Liver Function Tests: Recent Labs  Lab 10/07/20 0830  ALBUMIN 2.0*   No results for input(s): LIPASE, AMYLASE in the last 168 hours. No results for input(s): AMMONIA in the last 168 hours. CBC: Recent Labs  Lab 10/05/20 0052 10/06/20 0217 10/07/20 0037 10/08/20 0513 10/09/20 0920  WBC 17.2* 22.3* 17.4* 11.4* 10.3  HGB 12.2 12.3 11.6* 10.6* 9.7*  HCT 37.2 37.3 36.8 34.3* 31.0*  MCV 99.5 99.7 102.2* 104.3* 102.3*  PLT 233 252 253 239 251   Cardiac Enzymes: No results for input(s): CKTOTAL, CKMB, CKMBINDEX, TROPONINI in the last 168 hours. BNP (last 3 results) No results for input(s): BNP in the last 8760 hours.  ProBNP (last 3 results) No results for input(s): PROBNP in the last 8760 hours.  CBG: Recent Labs  Lab 10/05/20 1206 10/05/20 1913 10/05/20 2134 10/06/20 0601 10/06/20 1136  GLUCAP 204* 185* 181* 153* 168*    No results found for this or any previous visit (from the past 240 hour(s)).   Studies: No results found.  Scheduled Meds: . acetaminophen  650 mg Oral TID  . allopurinol  100 mg Oral q AM  . amiodarone  200 mg Oral BID  . brimonidine  1 drop Right Eye QHS   And  . brimonidine  2 drop Left Eye QHS  . Chlorhexidine Gluconate Cloth  6 each Topical Q0600  . cinacalcet  30 mg Oral Once per day on Tue Thu  . clopidogrel  75 mg Oral q AM  . feeding supplement (NEPRO CARB STEADY)  237 mL Oral TID BM  . hydrocortisone   Rectal TID  . hydrocortisone  25 mg Rectal BID  . latanoprost  1 drop Both Eyes QHS  . midodrine  10 mg Oral TID WC  . multivitamin  1 tablet Oral QHS  . polycarbophil  625 mg Oral BID WC  . sevelamer carbonate  1,600 mg Oral BID WC  . sodium chloride flush  3 mL Intravenous Q12H  . timolol  1 drop Left Eye QHS   Continuous  Infusions: . sodium chloride    . sodium chloride    . 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

## 2020-10-10 NOTE — Progress Notes (Signed)
Daily Progress Note   Patient Name: [name]       Date: 10/10/2020 DOB: Sep 23, 1940  Age: 80 y.o. MRN#: [id number] Attending Physician: Shawna Clamp, MD Primary Care Physician: Josetta Huddle, MD Admit Date: 09/23/2020  Reason for Consultation/Follow-up: Establishing goals of care  Subjective: Patient awake, alert, oriented and able to participate in discussion. Denies pain or discomfort. No dyspnea.  GOC:  Visited with patient x2 today. Spent 30+ minutes with patient this morning. Discussed events over night, plan of care, comfort focused pathway, and briefly discussed hospice options. She would like to further discuss plan and hospice when her family is back at bedside this afternoon. Therapeutic listening and emotional/spiritual support as patient shares her strong, Darrick Meigs faith and belief that this is in [employer]. She is not afraid.   This afternoon, met with patient, husband, daughter ([name]), and two granddaughters at bedside.   Reviewed course of hospitalization, events over night, and poor long-term prognosis. Patient and family understand her condition. Patient clearly verbalizes to family her decision to forgo further dialysis, understanding how weak her heart is and possible demise during dialysis.   Compassionately shared EOL trajectory with discontinuation of hemodialysis, explaining prognosis and recommendation for comfort measures and hospice services.   Discussed emphasis on symptom management medications to ensure comfort and relief from suffering. Discussed unrestricted visitor access.   Discussed hospice options and philosophy. Patient and family considering home with hospice (but would need to hire additional caregiver support) versus [place] hospice  facility. Patient/family request more time to process this conversation and hospice decision.   Answered questions. Support provided. 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   Length of Stay: 17  Current Medications: Scheduled Meds:  . brimonidine  1 drop Right Eye QHS   And  . brimonidine  2 drop Left Eye QHS  . Chlorhexidine Gluconate Cloth  6 each Topical Q0600  . feeding supplement (NEPRO CARB STEADY)  237 mL Oral TID BM  . hydrocortisone-pramoxine  1 applicator Rectal BID  . latanoprost  1 drop Both Eyes QHS  . sodium chloride flush  3 mL Intravenous Q12H  . timolol  1 drop Left Eye QHS    Continuous Infusions: . sodium chloride    . sodium chloride  PRN Meds: sodium chloride, acetaminophen **OR** acetaminophen, antiseptic oral rinse, diphenhydrAMINE, glycopyrrolate **OR** glycopyrrolate **OR** glycopyrrolate, haloperidol **OR** haloperidol **OR** haloperidol lactate, loperamide, morphine CONCENTRATE **OR** morphine CONCENTRATE, ondansetron **OR** ondansetron (ZOFRAN) IV, oxyCODONE-acetaminophen, polyvinyl alcohol, prochlorperazine, traZODone, witch hazel-glycerin  Physical Exam Vitals and nursing note reviewed.  Constitutional:      General: She is not in acute distress.    Appearance: She is ill-appearing.  Pulmonary:     Effort: No respiratory distress.  Skin:    General: Skin is warm and dry.  Neurological:     Mental Status: She is alert and oriented to person, place, and time.     Motor: Weakness present.  Psychiatric:        Attention and Perception: Attention normal.        Behavior: Behavior is cooperative.        Cognition and Memory: Cognition and memory normal.             Vital Signs: BP (!) 112/27 (BP Location: Left Leg)   Pulse 64   Temp 98.5 F (36.9 C) (Oral)   Resp 12   Ht _0  (1.626 m)   Wt 76.6 kg   SpO2 97%   BMI 28.99 kg/m  SpO2: SpO2: 97 % O2 Device: O2 Device: Room  Air O2 Flow Rate: O2 Flow Rate (L/min): 2 L/min  Intake/output summary:   Intake/Output Summary (Last 24 hours) at 10/10/2020 1033 Last data filed at 10/10/2020 2878 Gross per 24 hour  Intake 300 ml  Output 500 ml  Net -200 ml   LBM: Last BM Date: 10/08/20 Baseline Weight: Weight: 77.1 kg Most recent weight: Weight:  (unable to chect bed wt)       Palliative Assessment/Data: PPS 30%    Flowsheet Rows   Flowsheet Row Most Recent Value  Intake Tab   Referral Department Hospitalist  Unit at Time of Referral ICU  Palliative Care Primary Diagnosis Cardiac  Date Notified 09/25/20  Palliative Care Type Return patient Palliative Care  Reason for referral Clarify Goals of Care  Date of Admission 09/23/20  Date first seen by Palliative Care 09/25/20  # of days Palliative referral response time 0 Day(s)  # of days IP prior to Palliative referral 2  Clinical Assessment   Psychosocial & Spiritual Assessment   Palliative Care Outcomes       Patient Active Problem List   Diagnosis Date Noted  . Pressure injury of skin 09/28/2020  . Coronary artery disease involving autologous vein bypass graft 09/25/2020  . Demand ischemia of myocardium ([place]) 09/25/2020  . Syncope and collapse 09/23/2020  . Body mass index (BMI) 32.0-32.9, adult 08/01/2020  . Cervical disc disorder 08/01/2020  . Cough variant asthma 08/01/2020  . Dependence on renal dialysis ([place]) 08/01/2020  . Diabetic renal disease ([place]) 08/01/2020  . Diabetic retinopathy associated with type 2 diabetes mellitus ([place]) 08/01/2020  . Edema 08/01/2020  . Encounter for general adult medical examination without abnormal findings 08/01/2020  . Gout 08/01/2020  . Hereditary and idiopathic neuropathy, unspecified 08/01/2020  . Hyperglycemia due to type 2 diabetes mellitus ([place]) 08/01/2020  . Joint pain 08/01/2020  . 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 Osteoarthritis of knee 08/01/2020  . Other long term (current) drug  therapy 08/01/2020  . Other polyosteoarthritis 08/01/2020  . Pain in unspecified knee 08/01/2020  . Proteinuria 08/01/2020  . Pure hypercholesterolemia 08/01/2020  . Pure hypertriglyceridemia 08/01/2020  . Urinary tract infectious disease 08/01/2020  . Vitamin D deficiency 08/01/2020  .  Vomiting 08/01/2020  . Other disorders of phosphorus metabolism 01/11/2020  . Encounter for removal of sutures 12/04/2019  . Shortness of breath 07/29/2019  . Anaphylactic shock, unspecified, sequela 04/23/2019  . Secondary hyperparathyroidism of renal origin ([place]) 09/21/2018  . Iron deficiency anemia, unspecified 06/08/2018  . Hemorrhoids   . Diabetes mellitus type 2 in nonobese (HCC)   . Anemia, chronic disease   . Labile blood pressure   . Poorly controlled type 2 diabetes mellitus with peripheral neuropathy ([place])   . Debility   . Benign essential HTN   . Bradycardia   . Ventricular tachycardia ([place])   . Enteritis due to Clostridium difficile   . ESRD on dialysis ([place])   . Diabetic peripheral neuropathy ([place])   . Acute blood loss anemia   . Anemia of chronic disease   . Aspiration into airway   . NSTEMI (non-ST elevated myocardial infarction) ([place])   . QT prolongation 04/03/2018  . Acute respiratory failure with hypoxia ([place])   . Aspiration pneumonia due to gastric secretions ([place])   . Cardiac arrest ([place]) 04/02/2018  . Mild protein-calorie malnutrition ([place]) 03/13/2018  . Anxiety 05/13/2017  . Panic attack 05/13/2017  . SAH (subarachnoid hemorrhage) ([place]) 02/03/2017  . Fall 02/03/2017  . Spinal stenosis, lumbar region 03/02/2016  . Trochanteric bursitis of left hip 01/03/2016  . Generalized weakness 12/21/2015  . Hypokalemia 12/21/2015  . Lumbar back pain with radiculopathy affecting left lower extremity 12/04/2015  . Coronary artery disease involving native coronary artery of native heart without angina pectoris   . CRI (chronic renal insufficiency), stage 4 (severe) (HCC)   .  Urinary retention   . Anemia of chronic kidney failure   . Hx of gout   . Thrombocytopenia ([place])   . S/P lumbar discectomy 12/01/2015  . 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 Acute encephalopathy   . Chronic kidney disease (CKD), stage V ([place]) 07/13/2015  . Glaucoma   . Paroxysmal atrial fibrillation (HCC)   . Coronary artery disease involving native heart without angina pectoris   . Depression   . Stroke ([place])   . Diabetes mellitus without complication ([place])   . Gastroesophageal reflux disease with esophagitis   . Carotid artery stenosis 04/21/2015  . Essential hypertension 06/08/2014  . Hyperlipidemia associated with type 2 diabetes mellitus ([place]) 06/08/2014  . Occlusion and stenosis of carotid artery without mention of cerebral infarction 01/01/2012    Palliative Care Assessment & Plan   Patient Profile: 80 y.o.femalewith past medical history of ESRD on dialysis TTS, coronary artery disease s/p CABG, carotid stenosis, atrial fibrillation, previous history of non-sustained v-tach, DM2, HTN, HLD, and hearing loss presenting to the emergency departmenton3/12/2022with multiple episodes of syncope.She reportedly had an episode right after dialysis. Had another episode at home, became unresponsive, and family started CPR. When EMS arrived, monitor showed nonsustained v-tach bigeminy, lidocaine infusion was started.  ED Course: On arrival to ED patient was back in sinus rhythm. Chest x-ray showed left lower lobe consolidation probably atelectasis versus infection or aspiration. She was [facility] to [facility] for further evaluation of syncopal episodes.   On 3/13, patient was undergoing echocardiogram and developed symptomatic v-tach requiring CPR. She underwent emergent cardiac cath with findings of new 100% stenosis in multiple vessels. Patient was seen by EP, but is not a candidate for ICD.  3/28 evening:  Patient with clinical decline. Brief episode of asystole and then vtach. Family called to  bedside. Decision made for shift to comfort measures only.   Assessment: Syncopal episodes with collapse Symptomatic ventricular tachycardia Paroxysmal atrial fibrillation Coronary artery disease DM with nephropathy Essential hypertension ESRD on HD Anemia of chronic disease Generalized debility Deconditioned state Failure to thrive  Recommendations/Plan:  DNR/DNI  F/u GOC discussion with patient and family. Understanding condition and prognosis, patient verbalizes her decision to forgo further dialysis and focus on comfort. Family understands and respects her decision.   Discussed hospice philosophy and options. Patient/family considering home with hospice (with additional caregiver support) versus [place] hospice facility. Request more time to process conversation and consider options. PMT provider conference call with granddaughter tomorrow 3/30 afternoon.   Comfort meds added to [facility].  Comfort feeds per patient/family request.  Unrestricted visitor access.  Discontinue cardiac monitor.   Spiritual care support.    Code Status:    Code Status Orders  (From admission, onward)         Start     Ordered   09/25/20 1446  Do not attempt resuscitation (DNR)  Continuous       Question Answer Comment  In the event of cardiac or respiratory ARREST Do not call a "code blue"   In the event of cardiac or respiratory ARREST Do not perform Intubation, CPR, defibrillation or ACLS   In the event of cardiac or respiratory ARREST Use medication by any route, position, wound care, and other measures to relive pain and suffering. May use oxygen, suction and manual treatment of airway obstruction as needed for comfort.      09/25/20 1445        Code Status History    Date Active Date Inactive Code Status Order ID Comments User Context   09/25/2020 1258 09/25/2020 1445 Partial Code [id number]   Vickii Penna, RN Inpatient   09/24/2020 0155 09/25/2020 1258 Full Code [id number]  Elwyn Reach, MD Inpatient   04/16/2018 1024 04/28/2018 1821 Partial Code [id number]  Roney Jaffe, MD Inpatient   04/15/2018 1620 04/15/2018 1621 DNR [id number]  Cathlyn Parsons, PA-C Inpatient   04/15/2018 1620 04/15/2018 1620 Full Code [id number]  Cathlyn Parsons, PA-C Inpatient   04/13/2018 1711 04/15/2018 1542 DNR [id number]  Roney Jaffe, MD Inpatient   04/02/2018 2040 04/13/2018 1711 Full Code [id number]  Germain Osgood, PA-C ED   12/21/2015 1721 12/25/2015 1606 Partial Code [id number]  Samella Parr, NP Inpatient   12/04/2015 1653 12/15/2015 1451 Full Code [id number]  Bary Leriche, PA-C Inpatient   07/13/2015 1955 07/17/2015 1637 Partial Code [id number]  Ivor Costa, MD ED   04/21/2015 1658 04/22/2015 1624 Full Code [id number]  Gabriel Earing, PA-C Inpatient   07/29/2011 1802 08/03/2011 1434 Full Code [id number]  Raquel James, RN Inpatient   Advance Care Planning Activity       Prognosis:   Tenuous: likely less than 2 weeks with discontinuation of dialysis, recurrent symptomatic vtach with syncopal episodes, hypotension, and 100% ostial and proximal CTO of SVG RCA.  Discharge Planning:  To Be Determined  Care plan was discussed with RN, patient, family (husband, daughter, granddaughters), Erin Hearing NP, Dr. Dwyane Dee, [facility] team   Thank you for allowing the Palliative Medicine Team to assist in the care of this patient.   Total Time 80 Prolonged Time Billed  yes    Greater than 50% of this time was spent counseling and coordinating care related to the above assessment and plan.   Ihor Dow, DNP, FNP-C Palliative Medicine Team  Phone:  [phone] Fax: [phone]  Please contact Palliative Medicine Team phone at [phone] for questions and concerns.

## 2020-10-10 NOTE — Progress Notes (Signed)
Nutrition Brief Note  Chart reviewed. Pt now transitioning to comfort care.  No further nutrition interventions planned at this time.  Please re-consult as needed.   Blessings Inglett RD, LDN Clinical Nutrition Pager listed in AMION    

## 2020-10-10 NOTE — Progress Notes (Signed)
Pt incontinent of a moderate amount of bloody stool.  PA made aware.  Will continue to monitor.

## 2020-10-10 NOTE — Progress Notes (Signed)
Cross-coverage note:   Notified that patient was having wide-complex tachycardia and then 17 seconds asystole with transient LOC before spontaneous return to sinus rhythm. Family is at bedside. Family and patient said they were ready to transition to comfort care. Patient not dyspneic or in any pain.

## 2020-10-11 LAB — SARS CORONAVIRUS 2 (TAT 6-24 HRS): SARS Coronavirus 2: NEGATIVE

## 2020-10-11 MED ORDER — ACETAMINOPHEN 325 MG PO TABS: 650.0000 mg | ORAL_TABLET | Freq: Four times a day (QID) | ORAL | Status: AC | PRN

## 2020-10-11 MED ORDER — POLYVINYL ALCOHOL 1.4 % OP SOLN: 1.0000 [drp] | OPHTHALMIC | 0 refills | Status: AC | PRN

## 2020-10-11 MED ORDER — BRIMONIDINE TARTRATE 0.2 % OP SOLN: 2.0000 [drp] | Freq: Every day | OPHTHALMIC | 12 refills | Status: AC

## 2020-10-11 MED ORDER — BRIMONIDINE TARTRATE 0.2 % OP SOLN: 1.0000 [drp] | Freq: Every day | OPHTHALMIC | 12 refills | Status: AC

## 2020-10-11 MED ORDER — LATANOPROST 0.005 % OP SOLN: 1.0000 [drp] | Freq: Every day | OPHTHALMIC | 12 refills | Status: AC

## 2020-10-11 MED ORDER — HYDROCORT-PRAMOXINE (PERIANAL) 1-1 % EX FOAM: 1.0000 | Freq: Two times a day (BID) | CUTANEOUS | Status: AC

## 2020-10-11 MED ORDER — WITCH HAZEL-GLYCERIN EX PADS: MEDICATED_PAD | CUTANEOUS | 12 refills | Status: AC | PRN

## 2020-10-11 MED ORDER — ACETAMINOPHEN 650 MG RE SUPP: 650.0000 mg | Freq: Four times a day (QID) | RECTAL | 0 refills | Status: AC | PRN

## 2020-10-11 MED ORDER — OXYCODONE-ACETAMINOPHEN 5-325 MG PO TABS: 1.0000 | ORAL_TABLET | ORAL | 0 refills | Status: AC | PRN

## 2020-10-11 NOTE — Progress Notes (Addendum)
[facility] PROGRESS NOTE  [name] NID:[id number] DOB: 25-Oct-1940 DOA: 09/23/2020 PCP: Josetta Huddle, MD  Status:  Remains inpatient appropriate because:Unsafe d/c plan, IV treatments appropriate due to intensity of illness or inability to take PO and Inpatient level of care appropriate due to severity of illness   Dispo: The patient is from: Home              Anticipated d/c is to: [facility]               Patient currently is medically stable to d/c.   Difficult to place patient No   Level of care: Telemetry Cardiac-COMFORT MEASURES  Code Status: DNR Family Communication:  DVT prophylaxis: SQ Heparin-now dc'd due to comfort measures status Vaccination status: Unknown  Foley catheter: No  HPI: 80 y.o.femalewith medical history significant ofend-stage renal disease on hemodialysis TTS, coronary artery disease, carotid stenosis, atrial fibrillation, previous history of nonsustained V. tach, diabetes, essential hypertension, hyperlipidemia, hard of hearing who presented with multiple episodes of syncope prior to admission, later found to have vtach by EMS. Pt was admitted for further work up.  Patient was scheduled to undergo left heart cath on 3/14 when she sustained an episode of symptomatic V. Tach (code blue), requiring brief chest compressions with ROSC. Later she underwent emergent cath on 3/13 with new 100% ostial and proximal CTO of SVG RCA. EP was consulted, not a candidate for ICD. CT angio showed no PE. Patient continued to have several episodes of nonsustained V. tach or ventricular bigeminy, hypoxia. Cardiology recommended amiodarone, no beta-blocker due to bradycardia. Palliative medicine following, goals of care discussed, DNR/DNI, for now,  continue full scope medical treatment. Nephrology following for hemodialysis.  Subjective: Reports rectal pain much better after addition of Proctofoam. Sts family still trying to decide between [place]  and [facility]. Discussed differences-aware that with residential hospice family would have more time to focus on grieving without the burden of providing care. RH would allow for more quality time and provide immediate access to care if changes in needs better suited for facility based care than home care. Pt agreed and plans to d/w family and palliative team member as planned  Objective: Vitals:   10/10/20 1300 10/11/20 0402  BP: (!) 79/28 (!) 101/35  Pulse:  72  Resp:  16  Temp: 97.9 F (36.6 C) 97.6 F (36.4 C)  SpO2:  92%    Intake/Output Summary (Last 24 hours) at 10/11/2020 0757 Last data filed at 10/10/2020 1500 Gross per 24 hour  Intake --  Output 4 ml  Net -4 ml   Filed Weights    Exam:  Constitutional: NAD, calm Respiratory: L[place] CTA, RA Cardiovascular: S1S2, regular pulse, no edema Abdomen: Soft, nontender, BS+, no further rectal bleeding Neurologic: CN 2-12 grossly intact. Sensation intact, Strength 4/5 x all 4 extremities.  Psychiatric:  A and O x 3, pleasant   Assessment/Plan: Acute problems: Recurrent symptomatic ventricular tachycardia with multiple syncopal episodes (80), hypotension -Cardiac cath on 3/13 with findings of new 100% ostial and proximal CTO of SVG RCA. -Seen by EP, not a candidate for ICD. No further invasive intervention from cardiac standpoint. -Now on comfort measures so we will discontinue aspirin, Plavix, statin -Appreciate cardiology assistance.  Now that patient is comfort measures and has been documented as having some sinus pauses with?  Asystole will discontinue amiodarone -Palliative medicine also assisting with management of end-of-life issues and will see patient again today on 3/9 -As of  3/29 I have notified nephrology that patient is full comfort care will no longer require dialysis  Generalized debility, very deconditioned, FTT/Goals of CARE -Patient is now comfort care and plan will be to focus on providing a  dignified death. -End-of-life order set initiated with focus on treatment of pain with narcotics, provide anxiolytics, other symptom management.  Please see orders for details. -Family and patient decided resindential hospice is best option-CM/LCSW exploring options and bed availbuility  Paroxysmal atrial fibrillation-postop -Received amiodarone bolus with drip, transitioned to oral amiodarone which has been discontinued as above. -Cardiology following, not a candidate for anticoagulation due to previous brain bleed.  Diabetes mellitus type 2, IDDM, with nephropathy -Hemoglobin A1c 5.0 -Discontinue sliding scale insulin and CBG checks  Hypotension : -Discontinue midodrine  ESRD on hemodialysis, TTS -Patient has decided to pursue comfort care therefore dialysis has been discontinued -Nephrologist notified.  Renal navigator also notified so outpatient dialysis center can be made aware of change in plan.  Anemia of chronic disease : -We will no longer follow labs since she is comfort care  Transaminitis : Resolved.  Obesity Estimated body mass index is 28.61 kg/m as calculated from the following: Height as of this encounter: 5\' 4"  (1.626 m). Weight as of this encounter: 75.6 kg.      Data Reviewed: Basic Metabolic Panel: Recent Labs  Lab 10/05/20 0052 10/06/20 1008 10/07/20 0830 10/08/20 0512 10/09/20 0121  NA 130* 134* 133* 137 135  K 3.8 3.4* 3.4* 3.9 4.8  CL 93* 94* 93* 100 98  CO2 24 29 30 28 29   GLUCOSE 185* 173* 160* 124* 234*  BUN 38* 21 34* 20 31*  CREATININE 4.74* 3.47* 4.43* 3.16* 4.11*  CALCIUM 9.1 9.0 9.0 8.9 9.0  PHOS  --   --  2.8  --   --    Liver Function Tests: Recent Labs  Lab 10/07/20 0830  ALBUMIN 2.0*   No results for input(s): LIPASE, AMYLASE in the last 168 hours. No results for input(s): AMMONIA in the last 168 hours. CBC: Recent Labs  Lab 10/05/20 0052 10/06/20 0217 10/07/20 0037 10/08/20 0513 10/09/20 0920  WBC  17.2* 22.3* 17.4* 11.4* 10.3  HGB 12.2 12.3 11.6* 10.6* 9.7*  HCT 37.2 37.3 36.8 34.3* 31.0*  MCV 99.5 99.7 102.2* 104.3* 102.3*  PLT 233 252 253 239 251   Cardiac Enzymes: No results for input(s): CKTOTAL, CKMB, CKMBINDEX, TROPONINI in the last 168 hours. BNP (last 3 results) No results for input(s): BNP in the last 8760 hours.  ProBNP (last 3 results) No results for input(s): PROBNP in the last 8760 hours.  CBG: Recent Labs  Lab 10/05/20 1206 10/05/20 1913 10/05/20 2134 10/06/20 0601 10/06/20 1136  GLUCAP 204* 185* 181* 153* 168*    No results found for this or any previous visit (from the past 240 hour(s)).   Studies: No results found.  Scheduled Meds: . brimonidine  1 drop Right Eye QHS   And  . brimonidine  2 drop Left Eye QHS  . hydrocortisone-pramoxine  1 applicator Rectal BID  . latanoprost  1 drop Both Eyes QHS  . sodium chloride flush  3 mL Intravenous Q12H  . timolol  1 drop Left Eye QHS   Continuous Infusions: . sodium chloride    . sodium chloride      Principal Problem:   Ventricular tachycardia (HCC) Active Problems:   Essential hypertension   Hypokalemia   Hyperlipidemia associated with type 2 diabetes mellitus (HCC)   Paroxysmal  atrial fibrillation (HCC)   Coronary artery disease involving native heart without angina pectoris   QT prolongation   ESRD on dialysis ([place])   Anemia of chronic disease   Diabetes mellitus type 2 in nonobese [facility])   Syncope and collapse   Coronary artery disease involving autologous vein bypass graft   Demand ischemia of myocardium ([place])   Pressure injury of skin   Consultants:  Nephrology  Cardiology  Palliative medicine  Procedures:  Left heart cath 3/13  2D echocardiogram  Antibiotics: Anti-infectives (From admission, onward)   None       Time spent: 35 minutes    Erin Hearing ANP  [facility] 7 am - 330 pm/M-F for direct patient care and secure chat Please refer to Amion  for contact info 18  days

## 2020-10-11 NOTE — Progress Notes (Signed)
Daily Progress Note   Patient Name: [name]       Date: 10/11/2020 DOB: 09/16/1940  Age: 80 y.o. MRN#: [id number] Attending Physician: Shawna Clamp, MD Primary Care Physician: Josetta Huddle, MD Admit Date: 09/23/2020  Reason for Consultation/Follow-up: Establishing goals of care  Subjective: Patient sleeping during visit. Did not wake. Does not appear to be in pain or discomfort. No family at bedside.   GOC:  F/u scheduled [place] discussion with patient's granddaughter, Tiffany along with RN CM, Sharyn Lull. Tiffany shares that the patient and family spoke at length about hospice options. They have made decision for hospice facility placement in [facility]. Explained [facility] referral for assistance with discharge. Discussed hospice philosophy and comfort focused care plan. Answered questions for Tiffany and encouraged her to call me this afternoon with questions or concerns.   Maryjean Morn, NP and Dr. Dwyane Dee.   Length of Stay: 18  Current Medications: Scheduled Meds:  . brimonidine  1 drop Right Eye QHS   And  . brimonidine  2 drop Left Eye QHS  . hydrocortisone-pramoxine  1 applicator Rectal BID  . latanoprost  1 drop Both Eyes QHS  . sodium chloride flush  3 mL Intravenous Q12H  . timolol  1 drop Left Eye QHS    Continuous Infusions: . sodium chloride    . sodium chloride      PRN Meds: sodium chloride, acetaminophen **OR** acetaminophen, antiseptic oral rinse, diphenhydrAMINE, glycopyrrolate **OR** glycopyrrolate **OR** glycopyrrolate, haloperidol **OR** haloperidol **OR** haloperidol lactate, HYDROmorphone (DILAUDID) injection, loperamide, LORazepam, ondansetron **OR** ondansetron (ZOFRAN) IV, oxyCODONE-acetaminophen, polyvinyl alcohol, traZODone, witch  hazel-glycerin  Physical Exam Vitals and nursing note reviewed.  Constitutional:      General: She is sleeping. She is not in acute distress.    Appearance: She is ill-appearing.  Cardiovascular:     Rate and Rhythm: Normal rate.  Pulmonary:     Effort: No tachypnea, accessory muscle usage or respiratory distress.  Skin:    General: Skin is warm and dry.  Neurological:     Mental Status: She is oriented to person, place, and time.     Motor: Weakness present.     Comments: sleeping  Psychiatric:        Attention and Perception: Attention normal.        Cognition and Memory: Cognition and  memory normal.             Vital Signs: BP (!) 101/35 (BP Location: Left Leg)   Pulse 72   Temp 97.6 F (36.4 C) (Oral)   Resp 16   Ht 5\' 4"  (1.626 m)   Wt 76.6 kg   SpO2 92%   BMI 28.99 kg/m  SpO2: SpO2: 92 % O2 Device: O2 Device: Room Air O2 Flow Rate: O2 Flow Rate (L/min): 2 L/min  Intake/output summary:   Intake/Output Summary (Last 24 hours) at 10/11/2020 1100 Last data filed at 10/10/2020 1500 Gross per 24 hour  Intake --  Output 4 ml  Net -4 ml   LBM: Last BM Date: 10/11/20 Baseline Weight: Weight: 77.1 kg Most recent weight: Weight:  (unable to chect bed wt)       Palliative Assessment/Data: PPS 30%    Flowsheet Rows   Flowsheet Row Most Recent Value  Intake Tab   Referral Department Hospitalist  Unit at Time of Referral ICU  Palliative Care Primary Diagnosis Cardiac  Date Notified 09/25/20  Palliative Care Type Return patient Palliative Care  Reason for referral Clarify Goals of Care  Date of Admission 09/23/20  Date first seen by Palliative Care 09/25/20  # of days Palliative referral response time 0 Day(s)  # of days IP prior to Palliative referral 2  Clinical Assessment   Psychosocial & Spiritual Assessment   Palliative Care Outcomes       Patient Active Problem List   Diagnosis Date Noted  . Pressure injury of skin 09/28/2020  . Coronary artery  disease involving autologous vein bypass graft 09/25/2020  . Demand ischemia of myocardium ([place]) 09/25/2020  . Syncope and collapse 09/23/2020  . Body mass index (BMI) 32.0-32.9, adult 08/01/2020  . Cervical disc disorder 08/01/2020  . Cough variant asthma 08/01/2020  . Dependence on renal dialysis ([place]) 08/01/2020  . Diabetic renal disease ([place]) 08/01/2020  . Diabetic retinopathy associated with type 2 diabetes mellitus ([place]) 08/01/2020  . Edema 08/01/2020  . Encounter for general adult medical examination without abnormal findings 08/01/2020  . Gout 08/01/2020  . Hereditary and idiopathic neuropathy, unspecified 08/01/2020  . Hyperglycemia due to type 2 diabetes mellitus ([place]) 08/01/2020  . Joint pain 08/01/2020  . 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 Osteoarthritis of knee 08/01/2020  . Other long term (current) drug therapy 08/01/2020  . Other polyosteoarthritis 08/01/2020  . Pain in unspecified knee 08/01/2020  . Proteinuria 08/01/2020  . Pure hypercholesterolemia 08/01/2020  . Pure hypertriglyceridemia 08/01/2020  . Urinary tract infectious disease 08/01/2020  . Vitamin D deficiency 08/01/2020  . Vomiting 08/01/2020  . Other disorders of phosphorus metabolism 01/11/2020  . Encounter for removal of sutures 12/04/2019  . Shortness of breath 07/29/2019  . Anaphylactic shock, unspecified, sequela 04/23/2019  . Secondary hyperparathyroidism of renal origin ([place]) 09/21/2018  . Iron deficiency anemia, unspecified 06/08/2018  . Hemorrhoids   . Diabetes mellitus type 2 in nonobese (HCC)   . Anemia, chronic disease   . Labile blood pressure   . Poorly controlled type 2 diabetes mellitus with peripheral neuropathy ([place])   . Debility   . Benign essential HTN   . Bradycardia   . Ventricular tachycardia ([place])   . Enteritis due to Clostridium difficile   . ESRD on dialysis ([place])   . Diabetic peripheral neuropathy ([place])   . Acute blood loss anemia   . Anemia of chronic  disease   . Aspiration into airway   . NSTEMI (  non-ST elevated myocardial infarction) ([place])   . QT prolongation 04/03/2018  . Acute respiratory failure with hypoxia ([place])   . Aspiration pneumonia due to gastric secretions ([place])   . Cardiac arrest ([place]) 04/02/2018  . Mild protein-calorie malnutrition ([place]) 03/13/2018  . Anxiety 05/13/2017  . Panic attack 05/13/2017  . SAH (subarachnoid hemorrhage) ([place]) 02/03/2017  . Fall 02/03/2017  . Spinal stenosis, lumbar region 03/02/2016  . Trochanteric bursitis of left hip 01/03/2016  . Generalized weakness 12/21/2015  . Hypokalemia 12/21/2015  . Lumbar back pain with radiculopathy affecting left lower extremity 12/04/2015  . Coronary artery disease involving native coronary artery of native heart without angina pectoris   . CRI (chronic renal insufficiency), stage 4 (severe) (HCC)   . Urinary retention   . Anemia of chronic kidney failure   . Hx of gout   . Thrombocytopenia ([place])   . S/P lumbar discectomy 12/01/2015  . 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 Acute encephalopathy   . Chronic kidney disease (CKD), stage V ([place]) 07/13/2015  . Glaucoma   . Paroxysmal atrial fibrillation (HCC)   . Coronary artery disease involving native heart without angina pectoris   . Depression   . Stroke ([place])   . Diabetes mellitus without complication ([place])   . Gastroesophageal reflux disease with esophagitis   . Carotid artery stenosis 04/21/2015  . Essential hypertension 06/08/2014  . Hyperlipidemia associated with type 2 diabetes mellitus ([place]) 06/08/2014  . Occlusion and stenosis of carotid artery without mention of cerebral infarction 01/01/2012    Palliative Care Assessment & Plan   Patient Profile: 80 y.o.femalewith past medical history of ESRD on dialysis TTS, coronary artery disease s/p CABG, carotid stenosis, atrial fibrillation, previous history of  non-sustained v-tach, DM2, HTN, HLD, and hearing loss presenting to the emergency departmenton3/12/2022with multiple episodes of syncope.She reportedly had an episode right after dialysis. Had another episode at home, became unresponsive, and family started CPR. When EMS arrived, monitor showed nonsustained v-tach bigeminy, lidocaine infusion was started.  ED Course: On arrival to ED patient was back in sinus rhythm. Chest x-ray showed left lower lobe consolidation probably atelectasis versus infection or aspiration. She was [facility] to [facility] for further evaluation of syncopal episodes.   On 3/13, patient was undergoing echocardiogram and developed symptomatic v-tach requiring CPR. She underwent emergent cardiac cath with findings of new 100% stenosis in multiple vessels. Patient was seen by EP, but is not a candidate for ICD.  3/28 evening: Patient with clinical decline. Brief episode of asystole and then vtach. Family called to bedside. Decision made for shift to comfort measures only.   Assessment: Syncopal episodes with collapse Symptomatic ventricular tachycardia Paroxysmal atrial fibrillation Coronary artery disease DM with nephropathy Essential hypertension ESRD on HD Anemia of chronic disease Generalized debility Deconditioned state  Failure to thrive  Recommendations/Plan:  DNR/DNI  F/u GOC discussion with patient and family on 10/10/20. Understanding condition and prognosis, patient verbalizes her decision to forgo further dialysis and focus on comfort. Family understands and respects her decision.   Patient/family have made decision to pursue hospice facility placement. Prefer [facility] hospice facility.  Comfort meds added to [facility].  Comfort feeds per patient/family request.  Unrestricted visitor access.  Discontinue cardiac monitor.   Spiritual care support.    Code Status:    Code Status Orders  (From admission, onward)         Start     Ordered  09/25/20 1446  Do not attempt resuscitation (DNR)  Continuous       Question Answer Comment  In the event of cardiac or respiratory ARREST Do not call a "code blue"   In the event of cardiac or respiratory ARREST Do not perform Intubation, CPR, defibrillation or ACLS   In the event of cardiac or respiratory ARREST Use medication by any route, position, wound care, and other measures to relive pain and suffering. May use oxygen, suction and manual treatment of airway obstruction as needed for comfort.      09/25/20 1445        Code Status History    Date Active Date Inactive Code Status Order ID Comments User Context   09/25/2020 1258 09/25/2020 1445 Partial Code [id number]  Vickii Penna, RN Inpatient   09/24/2020 0155 09/25/2020 1258 Full Code [id number]  Elwyn Reach, MD Inpatient   04/16/2018 1024 04/28/2018 1821 Partial Code [id number]  Roney Jaffe, MD Inpatient   04/15/2018 1620 04/15/2018 1621 DNR [id number]  Cathlyn Parsons, PA-C Inpatient   04/15/2018 1620 04/15/2018 1620 Full Code [id number]  Cathlyn Parsons, PA-C Inpatient   04/13/2018 1711 04/15/2018 1542 DNR [id number]  Roney Jaffe, MD Inpatient   04/02/2018 2040 04/13/2018 1711 Full Code [id number]  Germain Osgood, PA-C ED   12/21/2015 1721 12/25/2015 1606 Partial Code [id number]  Samella Parr, NP Inpatient   12/04/2015 1653 12/15/2015 1451 Full Code [id number]  Bary Leriche, PA-C Inpatient   07/13/2015 1955 07/17/2015 1637 Partial Code [id number]  Ivor Costa, MD ED   04/21/2015 1658 04/22/2015 1624 Full Code [id number]  Gabriel Earing, PA-C Inpatient   07/29/2011 1802 08/03/2011 1434 Full Code [id number]  Raquel James, RN Inpatient   Advance Care Planning Activity       Prognosis:   Tenuous: likely less than 2 weeks with discontinuation of dialysis, recurrent symptomatic vtach with syncopal episodes, hypotension, and 100% ostial and proximal CTO of SVG RCA.  Discharge Planning:  Hospice facility  Care plan was  discussed with RN, patient, RN CM, granddaughter (Tiffany), Updated Ebony Hail NP and Dr. Dwyane Dee.  Thank you for allowing the Palliative Medicine Team to assist in the care of this patient.   Total Time 30 Prolonged Time Billed no   Greater than 50% of this time was spent counseling and coordinating care related to the above assessment and plan.   Ihor Dow, DNP, FNP-C Palliative Medicine Team  Phone: [phone] Fax: [phone]  Please contact Palliative Medicine Team phone at [phone] for questions and concerns.

## 2020-10-11 NOTE — TOC Progression Note (Addendum)
Transition of Care [facility]) - Progression Note    Patient Details  Name: [name] MRN: [id number] Date of Birth: Mar 05, 1941  Transition of Care [facility]) CM/SW [place], RN Phone Number: 10/11/2020, 10:41 AM  Clinical Narrative:    Case management spoke with Lurena Nida, NP this morning and the patient is considering Inpatient hospice with [facility] versus home with hospice care with family support.  The patient was inclined to inpatient placement after speaking with Erin Hearing, NP this morning, but Irish Lack, NP with Palliative is calling the family today at noon to discuss options for hospice support.  CM will be present when the family is called as well to offer choice regarding hospice services.  CM and MSW will continue to follow for discharge planning.  10/11/2020 1428 - CM called and spoke with [place] Guise, granddaughter on the phone to offer choice regarding hospice services and the graddaughter states that she spoke with the family and patient's spouse and they are in agreement that the patient be offered inpatient bed at [facility] in [place].  I called and spoke with Cassandra, CM at [phone] and [facility] will have an available bed at the facility tomorrow, 10/12/2020.  The COVID screen will be collected today and the patient will transfer to the facility tomorrow via [facility].  Cassandra, [place] with [facility] will be calling the family today to get admission paperwork started for transfer to the facility tomorrow.  CM and MSW will continue to follow the patient for transfer to Inpatient hospice at [facility].   Expected Discharge Plan: [place] Barriers to Discharge: Continued Medical Work up (Patient alert and currently under comfort care orders,)  Expected Discharge Plan and Services Expected Discharge Plan: [place] In-house Referral: Clinical Social  Work,Hospice / Palliative Care Discharge Planning Services: CM Consult Post Acute Care Choice: Hospice Living arrangements for the past 2 months: Single Family Home                                       Social Determinants of Health (SDOH) Interventions    Readmission Risk Interventions Readmission Risk Prevention Plan 10/10/2020  Transportation Screening Complete  PCP or Specialist Appt within 3-5 Days Complete  HRI or Home Care Consult Complete  Social Work Consult for [place] Planning/Counseling Complete  Palliative Care Screening Complete  Medication Review Press photographer) Complete  Some recent data might be hidden

## 2020-10-12 NOTE — Progress Notes (Signed)
Pt is having large rectal bleeding with clots, MD notified awaiting for return call.

## 2020-10-12 NOTE — Care Management Important Message (Signed)
Important Message  Patient Details  Name: [name] MRN: [id number] Date of Birth: 30-Mar-1941   Medicare Important Message Given:  Yes     Shelda Altes 10/12/2020, 9:01 AM

## 2020-10-12 NOTE — Progress Notes (Signed)
Report given to Lana at [facility]. All questions answered.

## 2020-10-12 NOTE — Discharge Summary (Addendum)
Physician Discharge Summary  [name] HTD:[id number] DOB: 1941/07/02 DOA: 09/23/2020  PCP: Josetta Huddle, MD  Admit date: 09/23/2020 Discharge date: 10/12/2020  Time spent: 35 minutes  Recommendations for Outpatient Follow-up:  1. Patient will be discharging to [place] [facility]    Discharge Diagnoses:  Principal Problem:   Ventricular tachycardia [facility]) Active Problems:   Essential hypertension   Hypokalemia   Hyperlipidemia associated with type 2 diabetes mellitus (HCC)   Paroxysmal atrial fibrillation (HCC)   Coronary artery disease involving native heart without angina pectoris   QT prolongation   ESRD on dialysis (HCC)   Anemia of chronic disease   Diabetes mellitus type 2 in nonobese (HCC)   Syncope and collapse   Coronary artery disease involving autologous vein bypass graft   Demand ischemia of myocardium (HCC)   Pressure injury of skin    Discharge Condition: Stable  Diet recommendation: As tolerated  Filed Weights    History of present illness:  80 y.o.femalewith medical history significant ofend-stage renal disease on hemodialysis TTS, coronary artery disease, carotid stenosis, atrial fibrillation, previous history of nonsustained V. tach, diabetes, essential hypertension, hyperlipidemia, hard of hearing who presented with multiple episodes of syncope prior to admission, later found to have vtach by EMS. Pt was admitted for further work up.  Patient was scheduled to undergo left heart cath on 3/14 when she sustained an episode of symptomatic V. Tach (code blue), requiring brief chest compressions with ROSC. Later she underwent emergent cath on 3/13 with new 100% ostial and proximal CTO of SVG RCA. EP was consulted, not a candidate for ICD. CT angio showed no PE. Patient continued to have several episodes of nonsustained V. tach or ventricular bigeminy, hypoxia. Cardiology recommended amiodarone, no beta-blocker due to  bradycardia. Palliative medicine following, goals of care discussed, DNR/DNI, for now, continue full scope medical treatment. Nephrology following for hemodialysis.  Unable 3/28 patient was noted to have wide-complex tachycardia with 17 seconds of asystole with transient loss as before spontaneous return to sinus rhythm.  Family at bedside.  At that time family and patient decided they were ready to transition to comfort care.  The following day patient did have some rectal bleeding secondary to hemorrhoids and this has improved with utilization of Proctofoam.  Family had discussions with the palliative care team regarding whether they wanted to go home with hospice or pursue residential hospice and they decided residential hospice would be the best option for patient and family.  Hospital Course:  Acute problems: Recurrent symptomatic ventricular tachycardia with multiple syncopal episodes ([place]), hypotension -Cardiac cath on 3/13 with findings of new 100% ostial and proximal CTO of SVG RCA. -Seen by EP, not a candidate for ICD. No further invasive intervention from cardiac standpoint. -Now on comfort measures so we will discontinue aspirin, Plavix, statin -Appreciate cardiology assistance.  Now that patient is comfort measures and has been documented as having some sinus pauses with?  Asystole will discontinue amiodarone -Palliative medicine also assisting with management of end-of-life issues and will see patient again today on 81/9 -As of 81/29 I have notified nephrology that patient is full comfort care will no longer require dialysis  Generalized debility, very deconditioned, FTT/Goals of CARE -Patient is now comfort care and plan will be to focus on providing a dignified death. -End-of-life order set initiated with focus on treatment of pain with narcotics, provide anxiolytics, other symptom management.  Please see orders for details. -Family and patient decided resindential hospice is best  option-CM/LCSW exploring options and bed availbuility  Paroxysmal atrial fibrillation-postop -Received amiodarone bolus with drip, transitioned to oral amiodarone which has been discontinued as above. -Cardiology following, not a candidate for anticoagulation due to previous brain bleed.  Diabetes mellitus type 2, IDDM, with nephropathy -Hemoglobin A1c 5.0 -Discontinue sliding scale insulin and CBG checks  Hypotension : -Discontinue midodrine  ESRD on hemodialysis, TTS -Patient has decided to pursue comfort care therefore dialysis has been discontinued -Nephrologist notified.  Renal navigator also notified so outpatient dialysis center can be made aware of change in plan.  Anemia of chronic disease : -We will no longer follow labs since she is comfort care  Transaminitis :Resolved.  Obesity Estimated body mass index is 28.61 kg/m as calculated from the following: Height as of this encounter: 5\' 4"  (1.626 m). Weight as of this encounter: 75.6 kg.   Procedures:  Left heart cath 3/13  2D echocardiogram   Consultations:  Nephrology  Cardiology  Palliative medicine    Discharge Exam: Vitals:   10/11/20 2009 10/12/20 0739  BP: (!) 104/39 (!) 103/92  Pulse: 65 73  Resp: 16 14  Temp: 98.7 F (37.1 C) 98.3 F (36.8 C)  SpO2: 95% 97%   Constitutional: NAD, calm Respiratory: [place] CTA, RA Cardiovascular: S1S2, regular pulse, no edema Abdomen: Soft, nontender, BS+, no further rectal bleeding Neurologic: CN 2-12 grossly intact. Sensation intact, Strength 4/5 x all 4 extremities.  Psychiatric:  A and O x 3, pleasant   Discharge Instructions: Agree with above NP note.  Patient was seen and examined.  She is alert, awake and oriented x 2.  Patient is being discharged to [place] [facility].  Discharge Instructions    Diet general   Complete by: As directed    Increase activity slowly   Complete by: As directed    No wound care    Complete by: As directed      Allergies as of 10/12/2020      Reactions   Neurontin [gabapentin] Other (See Comments)   Hallucinations and "Makes me go crazy"   Sulfa Antibiotics Other (See Comments)   Altered mental state    Norvasc [amlodipine] Other (See Comments)   "Makes my legs swell"; edema      Medication List    STOP taking these medications   allopurinol 100 MG tablet Commonly known as: ZYLOPRIM   atorvastatin 40 MG tablet Commonly known as: LIPITOR   b complex vitamins capsule   brimonidine 0.1 % Soln Commonly known as: ALPHAGAN P Replaced by: brimonidine 0.2 % ophthalmic solution   cinacalcet 30 MG tablet Commonly known as: SENSIPAR   citalopram 20 MG tablet Commonly known as: CELEXA   clopidogrel 75 MG tablet Commonly known as: PLAVIX   famotidine 20 MG tablet Commonly known as: PEPCID   FIBER-LAX PO   Fish Oil 1200 MG Caps   hydrALAZINE 25 MG tablet Commonly known as: APRESOLINE   lidocaine-prilocaine cream Commonly known as: EMLA   multivitamin Tabs tablet   nitroGLYCERIN 0.4 MG SL tablet Commonly known as: NITROSTAT   NovoLOG FlexPen 100 UNIT/ML FlexPen Generic drug: insulin aspart   Pen Needles 31G X 8 MM Misc   PROBIOTIC DAILY PO   sevelamer carbonate 800 MG tablet Commonly known as: RENVELA   timolol 0.5 % ophthalmic solution Commonly known as: TIMOPTIC   Travoprost (BAK Free) 0.004 % Soln ophthalmic solution Commonly known as: TRAVATAN Replaced by: latanoprost 0.005 % ophthalmic solution   TUMS PO   Vitamin  B-12 1000 MCG Subl   Vitamin D 50 MCG (2000 UT) tablet     TAKE these medications   acetaminophen 325 MG tablet Commonly known as: TYLENOL Take 2 tablets (650 mg total) by mouth every 6 (six) hours as needed for mild pain (or Fever >/= 101). What changed:   how much to take  when to take this  reasons to take this   acetaminophen 650 MG suppository Commonly known as: TYLENOL Place 1 suppository (650 mg  total) rectally every 6 (six) hours as needed for mild pain (or Fever >/= 101). What changed: You were already taking a medication with the same name, and this prescription was added. Make sure you understand how and when to take each.   brimonidine 0.2 % ophthalmic solution Commonly known as: ALPHAGAN Place 2 drops into the left eye at bedtime. Replaces: brimonidine 0.1 % Soln   brimonidine 0.2 % ophthalmic solution Commonly known as: ALPHAGAN Place 1 drop into the right eye at bedtime.   hydrocortisone-pramoxine rectal foam Commonly known as: PROCTOFOAM-HC Place 1 applicator rectally 2 (two) times daily.   latanoprost 0.005 % ophthalmic solution Commonly known as: XALATAN Place 1 drop into both eyes at bedtime. Replaces: Travoprost (BAK Free) 0.004 % Soln ophthalmic solution   oxyCODONE-acetaminophen 5-325 MG tablet Commonly known as: PERCOCET/ROXICET Take 1-2 tablets by mouth every 4 (four) hours as needed for moderate pain.   polyvinyl alcohol 1.4 % ophthalmic solution Commonly known as: LIQUIFILM TEARS Place 1 drop into both eyes as needed for dry eyes.   witch hazel-glycerin pad Commonly known as: TUCKS Apply topically as needed for itching.      Allergies  Allergen Reactions  . Neurontin [Gabapentin] Other (See Comments)    Hallucinations and "Makes me go crazy"   . Sulfa Antibiotics Other (See Comments)    Altered mental state   . Norvasc [Amlodipine] Other (See Comments)    "Makes my legs swell"; edema      The results of significant diagnostics from this hospitalization (including imaging, microbiology, ancillary and laboratory) are listed below for reference.    Significant Diagnostic Studies: CT Head Wo Contrast  Result Date: 09/23/2020 CLINICAL DATA:  Encephalopathy EXAM: CT HEAD WITHOUT CONTRAST TECHNIQUE: Contiguous axial images were obtained from the base of the skull through the vertex without intravenous contrast. COMPARISON:  None. FINDINGS:  Brain: There is no mass, hemorrhage or extra-axial collection. The size and configuration of the ventricles and extra-axial CSF spaces are normal. There is hypoattenuation of the white matter, most commonly indicating chronic small vessel disease. Vascular: Atherosclerotic calcification of the internal carotid arteries at the skull base. No abnormal hyperdensity of the major intracranial arteries or dural venous sinuses. Skull: The visualized skull base, calvarium and extracranial soft tissues are normal. Sinuses/Orbits: Right maxillary sinus mucosal thickening. The orbits are normal. IMPRESSION: Chronic small vessel disease without acute intracranial abnormality. Electronically Signed   By: Ulyses Jarred M.D.   On: 09/23/2020 20:47   CT ANGIO CHEST PE W OR WO CONTRAST  Result Date: 09/24/2020 CLINICAL DATA:  History of syncope and ventricular tachycardia EXAM: CT ANGIOGRAPHY CHEST WITH CONTRAST TECHNIQUE: Multidetector CT imaging of the chest was performed using the standard protocol during bolus administration of intravenous contrast. Multiplanar CT image reconstructions and MIPs were obtained to evaluate the vascular anatomy. CONTRAST:  19mL OMNIPAQUE IOHEXOL 350 MG/ML SOLN COMPARISON:  09/23/2020 FINDINGS: Cardiovascular: This is a technically adequate opacification of the pulmonary vasculature. No filling defects or pulmonary emboli.  The heart is enlarged without pericardial effusion. Normal caliber of the thoracic aorta. Extensive atherosclerosis of the aorta and coronary vasculature. Postsurgical changes from previous CABG. Mediastinum/Nodes: No enlarged mediastinal, hilar, or axillary lymph nodes. Thyroid gland, trachea, and esophagus demonstrate no significant findings. Lungs/Pleura: There are moderate bilateral pleural effusions volume estimated less than 1 L each. Dense bilateral dependent lower lobe consolidation consistent with atelectasis. No pneumothorax. Central airways are patent. Upper Abdomen:  No acute abnormality. Musculoskeletal: There are minimally displaced acute right anterolateral fourth through seventh rib fractures, likely related to recent CPR. Numerous other prior bilateral healed rib fractures are also noted. No other acute bony abnormalities. Reconstructed images demonstrate no additional findings. Review of the MIP images confirms the above findings. IMPRESSION: 1. No evidence of pulmonary embolus. 2. Moderate bilateral pleural effusions with dense bilateral lower lobe atelectasis. 3. Right anterolateral fourth through seventh acute rib fractures consistent with recent CPR. 4.  Aortic Atherosclerosis (ICD10-I70.0). Electronically Signed   By: Randa Ngo M.D.   On: 09/24/2020 21:17   MR BRAIN WO CONTRAST  Result Date: 09/24/2020 CLINICAL DATA:  61-year-old female with recurrent syncope and confusion. EXAM: MRI HEAD WITHOUT CONTRAST TECHNIQUE: Multiplanar, multiecho pulse sequences of the brain and surrounding structures were obtained without intravenous contrast. COMPARISON:  Head CT 09/23/2020.  Brain MRI 07/13/2015. FINDINGS: Brain: No restricted diffusion to suggest acute infarction. No midline shift, mass effect, evidence of mass lesion, ventriculomegaly, extra-axial collection or acute intracranial hemorrhage. Cervicomedullary junction and pituitary are within normal limits. Mild generalized cerebral volume loss since 2016. But gray and white matter signal remains largely normal for age. No cortical encephalomalacia. But there are occasional chronic micro hemorrhages in the brain, including the posterior right frontal lobe (series 14, image 37) and left cerebellum (image 13). Furthermore, there is intrinsic T1 hyperintensity in the bilateral globus pallidus since 2016 (series 16, image 30 on the left. Vascular: Major intracranial vascular flow voids are stable since 2016. 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 Mild associated visible cervical spinal stenosis, including at C2-C3. Heterogeneous bone marrow signal but within normal limits. Sinuses/Orbits: Negative orbits. Mild to moderate right maxillary sinus mucosal thickening is new, but other paranasal sinuses are improved compared to 2016. Other: Trace right mastoid fluid.  Negative visible nasopharynx. IMPRESSION: 1. Intrinsic T1 hyperintensity has developed in the bilateral globus pallidus since a 2016 MRI. This is most frequently associated with Hepatic Insufficiency / Cirrhosis. Correlation with serum Ammonia levels and liver function tests is recommended. 2. No other acute intracranial abnormality. Mild generalized cerebral volume loss since 2016 with occasional chronic micro hemorrhages in the brain. 3. 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 Electronically Signed   By: Genevie Ann M.D.   On: 09/24/2020 11:34   CARDIAC CATHETERIZATION  Result Date: 09/24/2020  -----------NATIVE VESSELS-------------------------  Ost LAD to Prox LAD lesion is 95% stenosed. Prox LAD lesion is 100% stenosed with 100% stenosed side branch in Ost 1st Diag.  Ost 1st Mrg lesion is 75% stenosed.  Prox Cx to Mid Cx lesion is 100% stenosed (just after 1st Mrg) with side branch in Ost 2nd Mrg.  Prox RCA lesion is 95% stenosed. Mid RCA to Dist RCA lesion is 100% stenosed.  ------------GRAFTS--------------------------  SVG-RCA graft was visualized by angiography and is small. Origin to Mid Graft lesion is 100% stenosed. - Previously stented Graft  SVG-1stDiag graft was visualized by angiography. Dist Graft  to Insertion lesion is 55% stenosed.  LIMA-dLAD graft was visualized by angiography and is normal in caliber. The graft exhibits no disease. There is no competitive flow  ---------------------------  There is mild left ventricular systolic dysfunction. The left ventricular ejection fraction is 45-50%  by visual estimate.  LV end diastolic pressure is mildly elevated.  There is a significant difference in arterial pressures versus cuff pressures-arterial pressures are at least 30- 40 mmHg higher.  SUMMARY  Severe native vessel CAD:  100% LAD following a 95% calcified lesion prior to SP1;  100% mid RCA following severe 80% stenosis,  100% AV groove LCx after OM1 with bridging collaterals to small PL branch,  75 to 80% calcified ostial OM1 (large bifurcating ramus-like vessel)  Grafts:  New lesion: 100% ostial and proximal CTO of SVG-RCA (was a very small caliber vessel previously stented.  The nature of this occlusion is such that it does not appear to be acute-the patient not actively having chest pain and no ST elevations, minimal troponin. => Likely not a reasonable target for PCI  SVG- 1st Diag has an anastomotic 50 to 60% stenosis of questionable significance (at this stage, the graft is the same caliber as a target vessel) ->  I chose not to attempt intervention on this location as she is hemodynamically clinically stable at this point. Would not want to complicate issues by potentially occluding another graft.  Patent LIMA-LAD with no left-to-right collaterals  Mildly reduced EF with inferobasal hypokinesis-45 to 50%.  Borderline elevated LVEDP of 15 mmHg  Significant discrepancy between aortic blood pressures and cuff blood pressures of at least 30 mmHg.  Cuff pressures are significantly lower. RECOMMENDATIONS  Return to CCU, would continue amiodarone load, consider lidocaine if VT recurs.  EP consultation to consider the possibility of ICD.  For future stabilization, could consider the possibility of intervention SVG-1st Diag (albeit a questionable significance), also atherectomy PCI of the OM1 (this is similar in appearance to previous cath in 2018, so this would only be to optimize distal flow - would likely need ischemic evaluation prior to proceeding with PCI on either lesion).   Prior to initiating pressor support in the ICU, would consider invasive arterial monitoring   DG CHEST PORT 1 VIEW  Result Date: 10/06/2020 CLINICAL DATA:  Shortness of breath EXAM: PORTABLE CHEST 1 VIEW COMPARISON:  September 23, 2020 chest radiograph; September 24, 2020 FINDINGS: There is a pleural effusion on the right with ill-defined underlying opacity, consistent with atelectasis and/or degree of pneumonia. There is consolidation in the medial left base. Left lung is otherwise clear. Heart is upper normal in size with pulmonary vascularity normal. Patient is status post coronary artery bypass grafting. There is aortic atherosclerosis. There is a stent in the left brachial region. No bone lesions. No adenopathy evident. IMPRESSION: Pleural effusion on the right. Suspect underlying atelectasis and potential patchy infiltrate. Consolidation medial left base raises concern for atelectasis with superimposed pneumonia. Stable cardiac prominence. Status post coronary artery bypass grafting. Aortic Atherosclerosis (ICD10-I70.0). Electronically Signed   By: Lowella Grip III M.D.   On: 10/06/2020 09:02   DG Chest Port 1 View  Result Date: 09/23/2020 CLINICAL DATA:  Ventricular tachycardia, cardiac arrest status post resuscitation EXAM: PORTABLE CHEST 1 VIEW COMPARISON:  04/10/2018 FINDINGS: Single frontal view of the chest demonstrates stable enlargement the cardiac silhouette, with postsurgical changes from median sternotomy and bypass surgery. Retrocardiac consolidation may reflect atelectasis, infection, or aspiration. Small left pleural effusion. No pneumothorax. No acute  displaced fractures. Vascular stent left axillary region. IMPRESSION: 1. Left lower lobe consolidation may reflect atelectasis, infection, or aspiration. 2. Small left pleural effusion. Electronically Signed   By: Randa Ngo M.D.   On: 09/23/2020 19:21   ECHOCARDIOGRAM COMPLETE  Result Date: 09/24/2020    ECHOCARDIOGRAM REPORT    Patient Name:   [name] Date of Exam: 09/24/2020 Medical Rec #:  [id number]         Height:       64.0 in Accession #:    [id number]        Weight:       170.0 lb Date of Birth:  02-17-1941          BSA:          1.826 m Patient Age:    51 years          BP:           84/65 mmHg Patient Gender: F                 HR:           52 bpm. Exam Location:  Inpatient Procedure: 2D Echo Indications:    cardiac arrest  History:        Patient has prior history of Echocardiogram examinations, most                 recent 05/31/2020. CAD, Prior CABG, end stage renal disease,                 Arrythmias:ventricular tachycardia; Signs/Symptoms:Syncope.  Sonographer:    Johny Chess Referring Phys: [place]  1. Patient had VT/VF arrest within minutes of starting echocardiogram. Remainder of study completed post cath, post arrest.  2. Left ventricular ejection fraction, by estimation, is 45%. The left ventricle has mildly decreased function. The left ventricle demonstrates regional wall motion abnormalities (see scoring diagram/findings for description). Left ventricular diastolic  parameters are consistent with Grade II diastolic dysfunction (pseudonormalization).  3. Right ventricular systolic function is severely reduced. The right ventricular size is moderately enlarged. There is normal pulmonary artery systolic pressure. The estimated right ventricular systolic pressure is 19.6 mmHg.  4. Left atrial size was moderately dilated.  5. Right atrial size was moderately dilated.  6. The mitral valve is degenerative. Mild mitral valve regurgitation. No evidence of mitral stenosis.  7. The aortic valve is grossly normal. There is mild thickening of the aortic valve. Aortic valve regurgitation is not visualized. No aortic stenosis is present.  8. The inferior vena cava is normal in size with <50% respiratory variability, suggesting right atrial pressure of 8 mmHg. FINDINGS  Left Ventricle: Left  ventricular ejection fraction, by estimation, is 45%. The left ventricle has mildly decreased function. The left ventricle demonstrates regional wall motion abnormalities. The left ventricular internal cavity size was normal in size. There is no left ventricular hypertrophy. Left ventricular diastolic parameters are consistent with Grade II diastolic dysfunction (pseudonormalization).  LV Wall Scoring: The basal inferolateral segment and basal inferior segment are akinetic. Right Ventricle: The right ventricular size is moderately enlarged. Right vetricular wall thickness was not well visualized. Right ventricular systolic function is severely reduced. There is normal pulmonary artery systolic pressure. The tricuspid regurgitant velocity is 2.27 m/s, and with an assumed right atrial pressure of 8 mmHg, the estimated right ventricular systolic pressure is 22.2 mmHg. Left Atrium: Left atrial size was moderately dilated. Right Atrium: Right atrial size was moderately  dilated. Pericardium: There is no evidence of pericardial effusion. Mitral Valve: The mitral valve is degenerative in appearance. Mild mitral annular calcification. Mild mitral valve regurgitation. No evidence of mitral valve stenosis. Tricuspid Valve: The tricuspid valve is normal in structure. Tricuspid valve regurgitation is mild. Aortic Valve: The aortic valve is grossly normal. There is mild thickening of the aortic valve. Aortic valve regurgitation is not visualized. No aortic stenosis is present. Pulmonic Valve: The pulmonic valve was not well visualized. Pulmonic valve regurgitation is trivial. Aorta: The aortic root is normal in size and structure. Venous: The inferior vena cava is normal in size with less than 50% respiratory variability, suggesting right atrial pressure of 8 mmHg. IAS/Shunts: No atrial level shunt detected by color flow Doppler.  LEFT VENTRICLE PLAX 2D LVIDd:         4.40 cm Diastology LVIDs:         3.60 cm LV e' medial:     5.77 cm/s LV PW:         0.90 cm LV E/e' medial:  24.4 LV IVS:        0.70 cm LV e' lateral:   7.51 cm/s                        LV E/e' lateral: 18.8  RIGHT VENTRICLE            IVC RV S prime:     4.35 cm/s  IVC diam: 1.80 cm LEFT ATRIUM             Index       RIGHT ATRIUM           Index LA diam:        3.60 cm 1.97 cm/m  RA Area:     16.20 cm LA Vol (A2C):   71.5 ml 39.16 ml/m RA Volume:   41.10 ml  22.51 ml/m LA Vol (A4C):   34.5 ml 18.90 ml/m LA Biplane Vol: 51.6 ml 28.26 ml/m  AORTIC VALVE LVOT Vmax:   84.90 cm/s LVOT Vmean:  57.800 cm/s LVOT VTI:    0.221 m  AORTA Ao Asc diam: 2.30 cm MITRAL VALVE                TRICUSPID VALVE MV Area (PHT): 2.87 cm     TR Peak grad:   20.6 mmHg MV Decel Time: 264 msec     TR Vmax:        227.00 cm/s MV E velocity: 141.00 cm/s MV A velocity: 80.80 cm/s   SHUNTS MV E/A ratio:  1.75         Systemic VTI: 0.22 m Cherlynn Kaiser MD Electronically signed by Cherlynn Kaiser MD Signature Date/Time: 09/24/2020/4:44:35 PM    Final     Microbiology: Recent Results (from the past 240 hour(s))  SARS CORONAVIRUS 2 (TAT 6-24 HRS) Nasopharyngeal Nasopharyngeal Swab     Status: None   Collection Time: 10/11/20  4:58 PM   Specimen: Nasopharyngeal Swab  Result Value Ref Range Status   SARS Coronavirus 2 NEGATIVE NEGATIVE Final    Comment: (NOTE) SARS-CoV-2 target nucleic acids are NOT DETECTED.  The SARS-CoV-2 RNA is generally detectable in upper and lower respiratory specimens during the acute phase of infection. Negative results do not preclude SARS-CoV-2 infection, do not rule out co-infections with other pathogens, and should not be used as the sole basis for treatment or other patient management decisions. Negative results must be combined with  clinical observations, patient history, and epidemiological information. The expected result is Negative.  Fact Sheet for Patients: SugarRoll.be  Fact Sheet for Healthcare  Providers: https://www.woods-mathews.com/  This test is not yet approved or cleared by the [place] FDA and  has been authorized for detection and/or diagnosis of SARS-CoV-2 by FDA under an Emergency Use Authorization (EUA). This EUA will remain  in effect (meaning this test can be used) for the duration of the COVID-19 declaration under Se ction 564(b)(1) of the Act, 21 U.S.C. section 360bbb-3(b)(1), unless the authorization is terminated or revoked sooner.  Performed at [facility], [place] [address] [place], [place] [address]      Labs: Basic Metabolic Panel: Recent Labs  Lab 10/06/20 1008 10/07/20 0830 10/08/20 0512 10/09/20 0121  NA 134* 133* 137 135  K 3.4* 3.4* 3.9 4.8  CL 94* 93* 100 98  CO2 29 30 28 29   GLUCOSE 173* 160* 124* 234*  BUN 21 34* 20 31*  CREATININE 3.47* 4.43* 3.16* 4.11*  CALCIUM 9.0 9.0 8.9 9.0  PHOS  --  2.8  --   --    Liver Function Tests: Recent Labs  Lab 10/07/20 0830  ALBUMIN 2.0*   No results for input(s): LIPASE, AMYLASE in the last 168 hours. No results for input(s): AMMONIA in the last 168 hours. CBC: Recent Labs  Lab 10/06/20 0217 10/07/20 0037 10/08/20 0513 10/09/20 0920  WBC 22.3* 17.4* 11.4* 10.3  HGB 12.3 11.6* 10.6* 9.7*  HCT 37.3 36.8 34.3* 31.0*  MCV 99.7 102.2* 104.3* 102.3*  PLT 252 253 239 251   Cardiac Enzymes: No results for input(s): CKTOTAL, CKMB, CKMBINDEX, TROPONINI in the last 168 hours. BNP: BNP (last 3 results) No results for input(s): BNP in the last 8760 hours.  ProBNP (last 3 results) No results for input(s): PROBNP in the last 8760 hours.  CBG: Recent Labs  Lab 10/05/20 1206 10/05/20 1913 10/05/20 2134 10/06/20 0601 10/06/20 1136  GLUCAP 204* 185* 181* 153* 168*    Agree with above NP note. Patient was seen and examined.  She is alert, awake and oriented x 2. Patient is being discharged to [facility].   Signed:  Erin Hearing ANP [facility] 10/12/2020, 9:36 AM

## 2020-10-12 NOTE — Progress Notes (Signed)
Pt picked up by [facility] to transport to [facility]. Daughter [name] given update on pt's transport.

## 2020-10-12 NOTE — TOC Transition Note (Signed)
Transition of Care [facility]) - CM/SW Discharge Note   Patient Details  Name: [name] MRN: [id number] Date of Birth: 08/27/1940  [facility] CM/SW Contact:  Curlene Labrum, RN Phone Number: 10/12/2020, 8:17 AM   Clinical Narrative:    Case management spoke with [name], CM at [facility] facility and the patient will be transitioning to [facility] today around 1100.  The facility has a ready bed available and the family has already completed admission paperwork.  I spoke with both the granddaughter and daughter, [name] on the phone and they are aware that the patient will transfer to the hospice facility by [facility] around 1100 today.  Bedside nursing should call report to [facility] - [phone] and ask for the nurse receiving the patient.  [facility] address is [address] [place] [address].  CM and MSW will continue to follow the patient for admission and transfer to [facility] today.   Final next level of care: [place] Barriers to Discharge: Continued Medical Work up (Patient alert and currently under comfort care orders,)   Patient Goals and CMS Choice Patient states their goals for this hospitalization and ongoing recovery are:: Patient is agreeable for hospice care - awaiting palliative eval for ?inpatient hospice in [facility].gov Compare Post Acute Care list provided to:: Patient Choice offered to / list presented to : Patient  Discharge Placement                       Discharge Plan and Services In-house Referral: Clinical Social Work,Hospice / Palliative Care Discharge Planning Services: CM Consult Post Acute Care Choice: Hospice                               Social Determinants of Health (SDOH) Interventions     Readmission Risk Interventions Readmission Risk Prevention Plan 10/10/2020  Transportation Screening Complete  PCP or  Specialist Appt within 3-5 Days Complete  HRI or [place] Complete  Social Work Consult for [place] Planning/Counseling Complete  Palliative Care Screening Complete  Medication Review Press photographer) Complete  Some recent data might be hidden

## 2020-10-21 DIAGNOSIS — N184 Chronic kidney disease, stage 4 (severe): Secondary | ICD-10-CM | POA: Diagnosis not present

## 2020-10-21 DIAGNOSIS — E1140 Type 2 diabetes mellitus with diabetic neuropathy, unspecified: Secondary | ICD-10-CM | POA: Diagnosis not present

## 2020-10-21 DIAGNOSIS — E782 Mixed hyperlipidemia: Secondary | ICD-10-CM | POA: Diagnosis not present

## 2020-10-21 DIAGNOSIS — E0840 Diabetes mellitus due to underlying condition with diabetic neuropathy, unspecified: Secondary | ICD-10-CM | POA: Diagnosis not present

## 2020-10-21 DIAGNOSIS — I10 Essential (primary) hypertension: Secondary | ICD-10-CM | POA: Diagnosis not present

## 2020-10-21 DIAGNOSIS — I25111 Atherosclerotic heart disease of native coronary artery with angina pectoris with documented spasm: Secondary | ICD-10-CM | POA: Diagnosis not present

## 2020-10-21 DIAGNOSIS — J45991 Cough variant asthma: Secondary | ICD-10-CM | POA: Diagnosis not present

## 2020-10-21 DIAGNOSIS — E1121 Type 2 diabetes mellitus with diabetic nephropathy: Secondary | ICD-10-CM | POA: Diagnosis not present

## 2020-10-21 DIAGNOSIS — K219 Gastro-esophageal reflux disease without esophagitis: Secondary | ICD-10-CM | POA: Diagnosis not present

## 2020-10-21 DIAGNOSIS — I2510 Atherosclerotic heart disease of native coronary artery without angina pectoris: Secondary | ICD-10-CM | POA: Diagnosis not present

## 2020-11-05 ENCOUNTER — Other Ambulatory Visit: Payer: Self-pay | Admitting: Interventional Cardiology

## 2020-11-12 DEATH — deceased
